# Patient Record
Sex: Male | Born: 1960
Health system: Southern US, Community
[De-identification: ages and names within clinical notes are randomized; demographics above are authoritative.]

## PROBLEM LIST (undated history)

## (undated) DIAGNOSIS — F411 Generalized anxiety disorder: Secondary | ICD-10-CM

## (undated) DIAGNOSIS — Z87442 Personal history of urinary calculi: Secondary | ICD-10-CM

## (undated) DIAGNOSIS — M545 Low back pain, unspecified: Secondary | ICD-10-CM

## (undated) DIAGNOSIS — J449 Chronic obstructive pulmonary disease, unspecified: Secondary | ICD-10-CM

## (undated) DIAGNOSIS — Z923 Personal history of irradiation: Secondary | ICD-10-CM

## (undated) DIAGNOSIS — I10 Essential (primary) hypertension: Secondary | ICD-10-CM

## (undated) DIAGNOSIS — E785 Hyperlipidemia, unspecified: Secondary | ICD-10-CM

## (undated) DIAGNOSIS — R0789 Other chest pain: Secondary | ICD-10-CM

## (undated) DIAGNOSIS — K219 Gastro-esophageal reflux disease without esophagitis: Secondary | ICD-10-CM

## (undated) DIAGNOSIS — J4489 Other specified chronic obstructive pulmonary disease: Secondary | ICD-10-CM

## (undated) DIAGNOSIS — J309 Allergic rhinitis, unspecified: Secondary | ICD-10-CM

## (undated) DIAGNOSIS — C801 Malignant (primary) neoplasm, unspecified: Secondary | ICD-10-CM

## (undated) DIAGNOSIS — J45909 Unspecified asthma, uncomplicated: Secondary | ICD-10-CM

## (undated) HISTORY — DX: Allergic rhinitis, unspecified: J30.9

## (undated) HISTORY — DX: Gastro-esophageal reflux disease without esophagitis: K21.9

## (undated) HISTORY — DX: Chronic obstructive pulmonary disease, unspecified: J44.9

## (undated) HISTORY — PX: APPENDECTOMY: SHX54

## (undated) HISTORY — DX: Hyperlipidemia, unspecified: E78.5

## (undated) HISTORY — PX: COLONOSCOPY: SHX174

## (undated) HISTORY — DX: Low back pain, unspecified: M54.50

## (undated) HISTORY — DX: Generalized anxiety disorder: F41.1

## (undated) HISTORY — DX: Low back pain: M54.5

## (undated) HISTORY — DX: Other chest pain: R07.89

## (undated) HISTORY — DX: Other specified chronic obstructive pulmonary disease: J44.89

## (undated) HISTORY — PX: VASECTOMY: SHX75

## (undated) NOTE — *Deleted (*Deleted)
Medical screening examination/treatment/procedure(s) were conducted as a shared visit with non-physician practitioner(s) and myself.  I personally evaluated the patient during the encounter  

---

## 2000-04-25 HISTORY — PX: NASAL TURBINATE REDUCTION: SHX2072

## 2000-09-12 ENCOUNTER — Encounter (INDEPENDENT_AMBULATORY_CARE_PROVIDER_SITE_OTHER): Payer: Self-pay

## 2000-09-12 ENCOUNTER — Other Ambulatory Visit: Admission: RE | Admit: 2000-09-12 | Discharge: 2000-09-12 | Payer: Self-pay | Admitting: Otolaryngology

## 2005-09-16 ENCOUNTER — Ambulatory Visit: Payer: Self-pay | Admitting: Pulmonary Disease

## 2005-11-01 ENCOUNTER — Ambulatory Visit: Payer: Self-pay | Admitting: Pulmonary Disease

## 2005-11-02 ENCOUNTER — Ambulatory Visit: Payer: Self-pay | Admitting: Pulmonary Disease

## 2006-03-01 ENCOUNTER — Ambulatory Visit: Payer: Self-pay | Admitting: Pulmonary Disease

## 2006-08-14 ENCOUNTER — Ambulatory Visit: Payer: Self-pay | Admitting: Pulmonary Disease

## 2006-08-14 IMAGING — CR DG CHEST 2V
2 series · 2 of 2 positions shown · non-contrast
Comparison: [DATE]

CLINICAL DATA: Cough, short of breath.
 CHEST - 2 VIEW:

[view not recorded (1 of 2)]
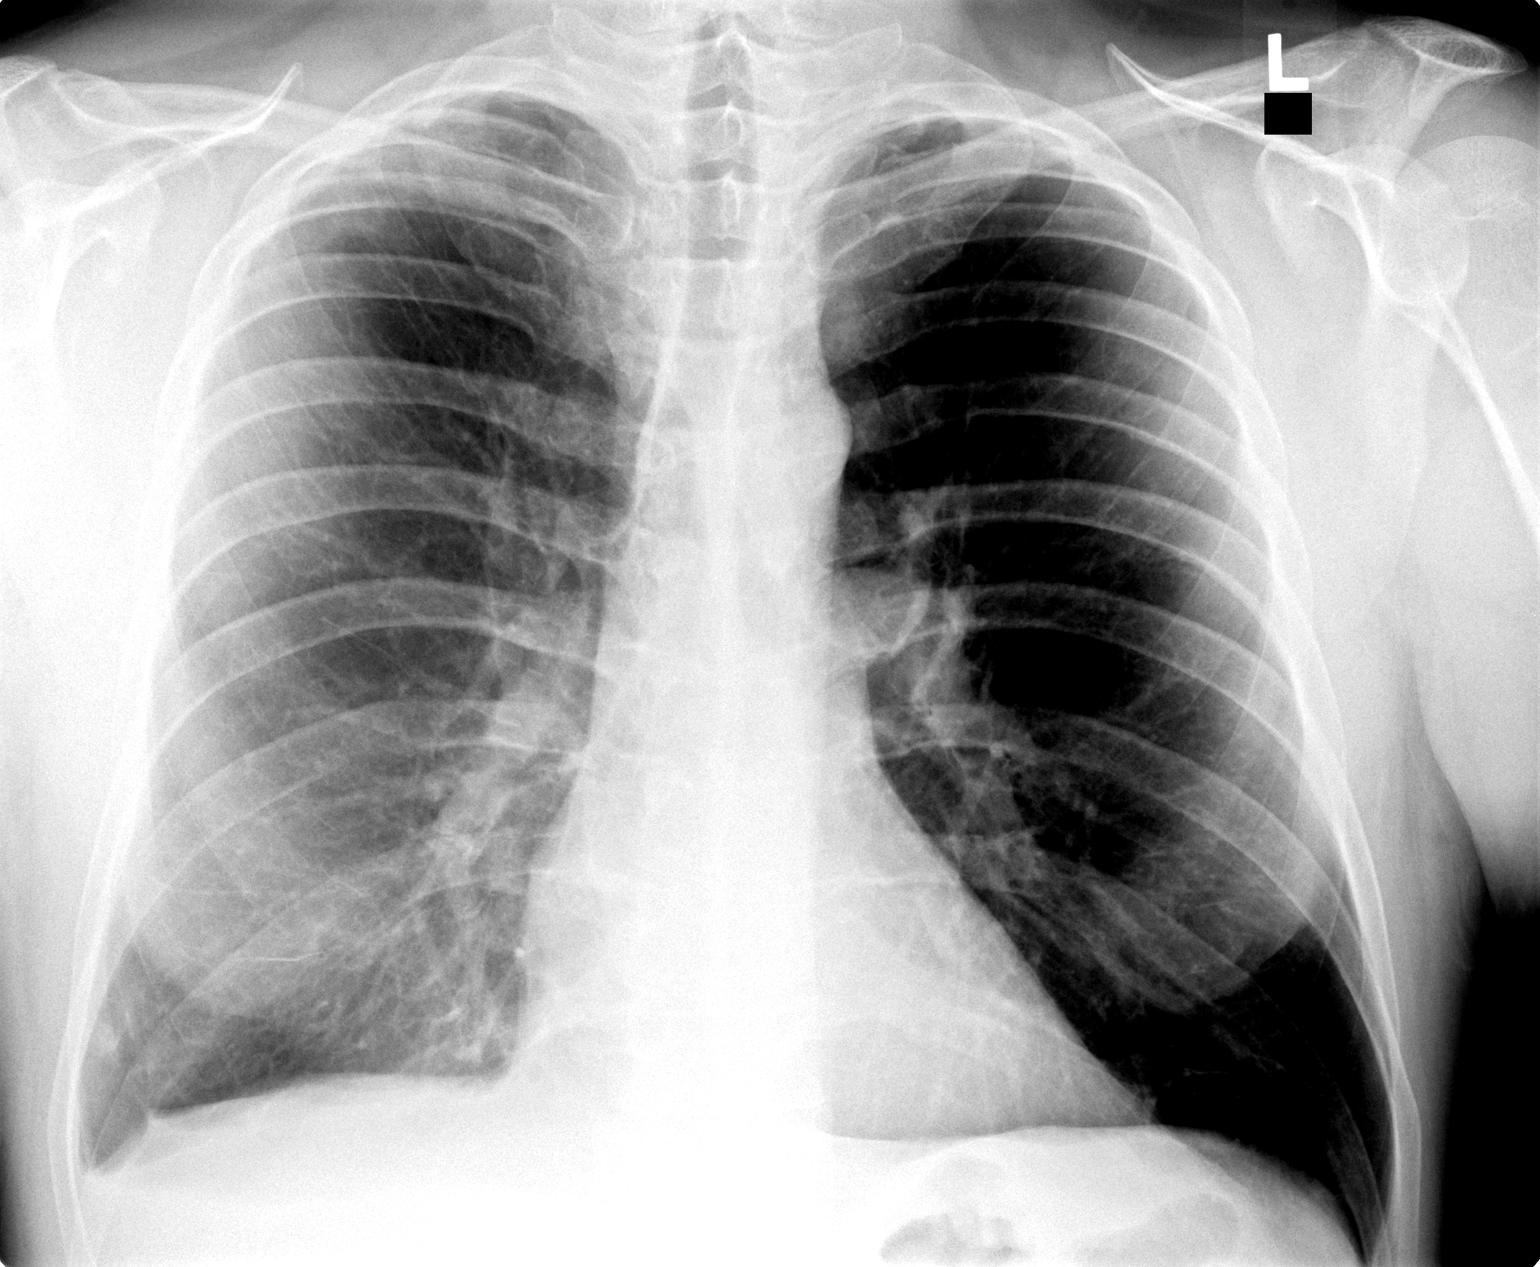

[view not recorded (2 of 2)]
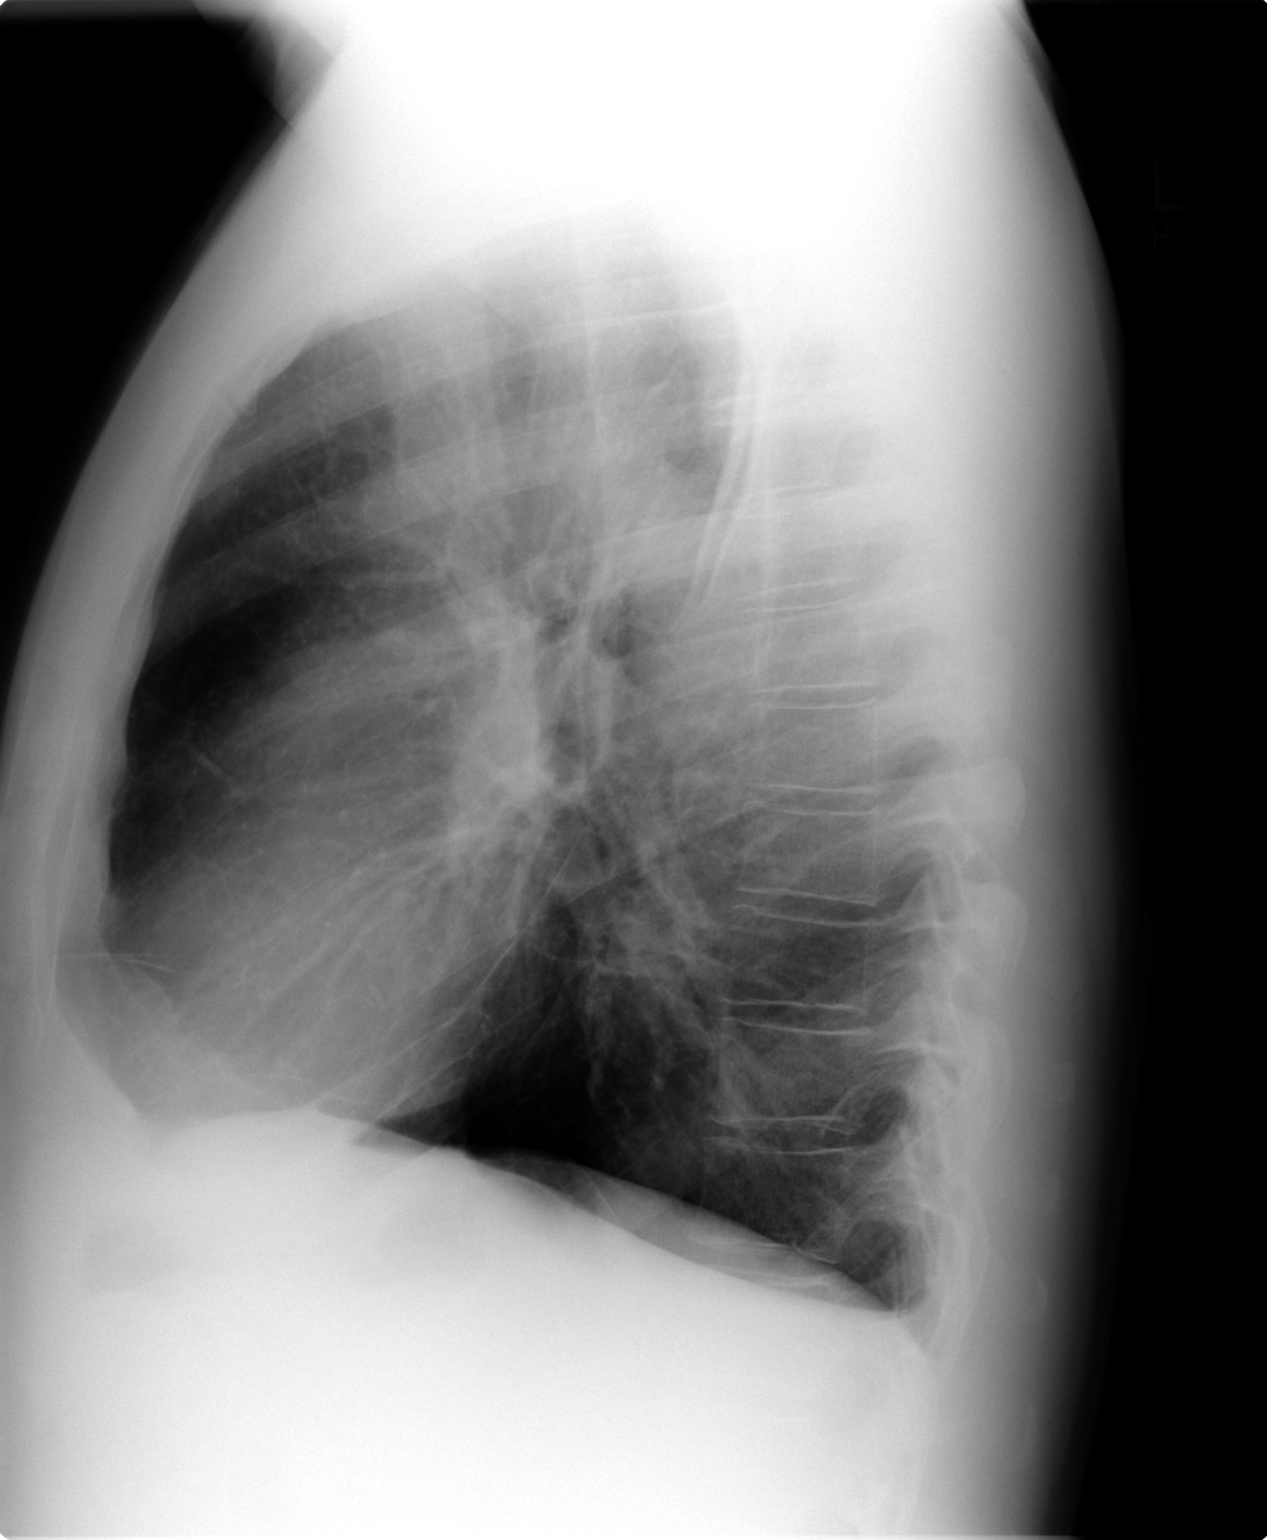

[2 of 2 positions shown; findings below may reference images not displayed]

FINDINGS: The lungs are hyperinflated with changes of COPD.  Ill-defined 1 cm density in the right lung base is unchanged from the prior study.  Stability would suggest a chronic process such as scarring.   No infiltrate or effusions identified.
IMPRESSION: 1.  COPD with hyperinflation and scarring. 
 2.  Stable density in the right lung base.

## 2006-08-25 ENCOUNTER — Ambulatory Visit: Payer: Self-pay | Admitting: Cardiology

## 2006-08-25 ENCOUNTER — Ambulatory Visit: Payer: Self-pay | Admitting: Pulmonary Disease

## 2006-08-25 LAB — CONVERTED CEMR LAB
ALT: 32 units/L (ref 0–40)
AST: 24 units/L (ref 0–37)
Albumin: 3.8 g/dL (ref 3.5–5.2)
Alkaline Phosphatase: 104 units/L (ref 39–117)
BUN: 5 mg/dL — ABNORMAL LOW (ref 6–23)
Basophils Absolute: 0 10*3/uL (ref 0.0–0.1)
Basophils Relative: 0.3 % (ref 0.0–1.0)
Bilirubin, Direct: 0.2 mg/dL (ref 0.0–0.3)
CO2: 31 meq/L (ref 19–32)
Calcium: 9 mg/dL (ref 8.4–10.5)
Chloride: 106 meq/L (ref 96–112)
Cholesterol: 133 mg/dL (ref 0–200)
Creatinine, Ser: 0.9 mg/dL (ref 0.4–1.5)
Eosinophils Absolute: 0.1 10*3/uL (ref 0.0–0.6)
Eosinophils Relative: 1.9 % (ref 0.0–5.0)
GFR calc Af Amer: 117 mL/min
GFR calc non Af Amer: 97 mL/min
Glucose, Bld: 99 mg/dL (ref 70–99)
HCT: 46.1 % (ref 39.0–52.0)
HDL: 33.7 mg/dL — ABNORMAL LOW (ref >39.0)
Hemoglobin: 16 g/dL (ref 13.0–17.0)
LDL Cholesterol: 74 mg/dL (ref 0–99)
Lymphocytes Relative: 35 % (ref 12.0–46.0)
MCHC: 34.6 g/dL (ref 30.0–36.0)
MCV: 92.4 fL (ref 78.0–100.0)
Monocytes Absolute: 0.5 10*3/uL (ref 0.2–0.7)
Monocytes Relative: 8.3 % (ref 3.0–11.0)
Neutro Abs: 3 10*3/uL (ref 1.4–7.7)
Neutrophils Relative %: 54.5 % (ref 43.0–77.0)
Platelets: 275 10*3/uL (ref 150–400)
Potassium: 4 meq/L (ref 3.5–5.1)
RBC: 4.99 M/uL (ref 4.22–5.81)
RDW: 13.1 % (ref 11.5–14.6)
Sed Rate: 9 mm/hr (ref 0–20)
Sodium: 140 meq/L (ref 135–145)
TSH: 2.09 microintl units/mL (ref 0.35–5.50)
Total Bilirubin: 1.1 mg/dL (ref 0.3–1.2)
Total CHOL/HDL Ratio: 3.9
Total Protein: 6.6 g/dL (ref 6.0–8.3)
Triglycerides: 129 mg/dL (ref 0–149)
VLDL: 26 mg/dL (ref 0–40)
WBC: 5.6 10*3/uL (ref 4.5–10.5)

## 2007-02-26 ENCOUNTER — Ambulatory Visit: Payer: Self-pay | Admitting: Pulmonary Disease

## 2007-02-26 DIAGNOSIS — M545 Low back pain, unspecified: Secondary | ICD-10-CM | POA: Insufficient documentation

## 2007-02-26 DIAGNOSIS — R930 Abnormal findings on diagnostic imaging of skull and head, not elsewhere classified: Secondary | ICD-10-CM | POA: Insufficient documentation

## 2007-02-26 DIAGNOSIS — J309 Allergic rhinitis, unspecified: Secondary | ICD-10-CM | POA: Insufficient documentation

## 2007-02-26 DIAGNOSIS — J449 Chronic obstructive pulmonary disease, unspecified: Secondary | ICD-10-CM | POA: Insufficient documentation

## 2007-04-12 ENCOUNTER — Telehealth: Payer: Self-pay | Admitting: Pulmonary Disease

## 2007-11-14 ENCOUNTER — Ambulatory Visit: Payer: Self-pay | Admitting: Pulmonary Disease

## 2007-11-14 IMAGING — CR DG CHEST 2V
2 series · 2 of 2 positions shown · non-contrast
Comparison: [DATE]

CLINICAL DATA: Abnormal chest x-ray.

CHEST - 2 VIEW

[view not recorded (1 of 2)]
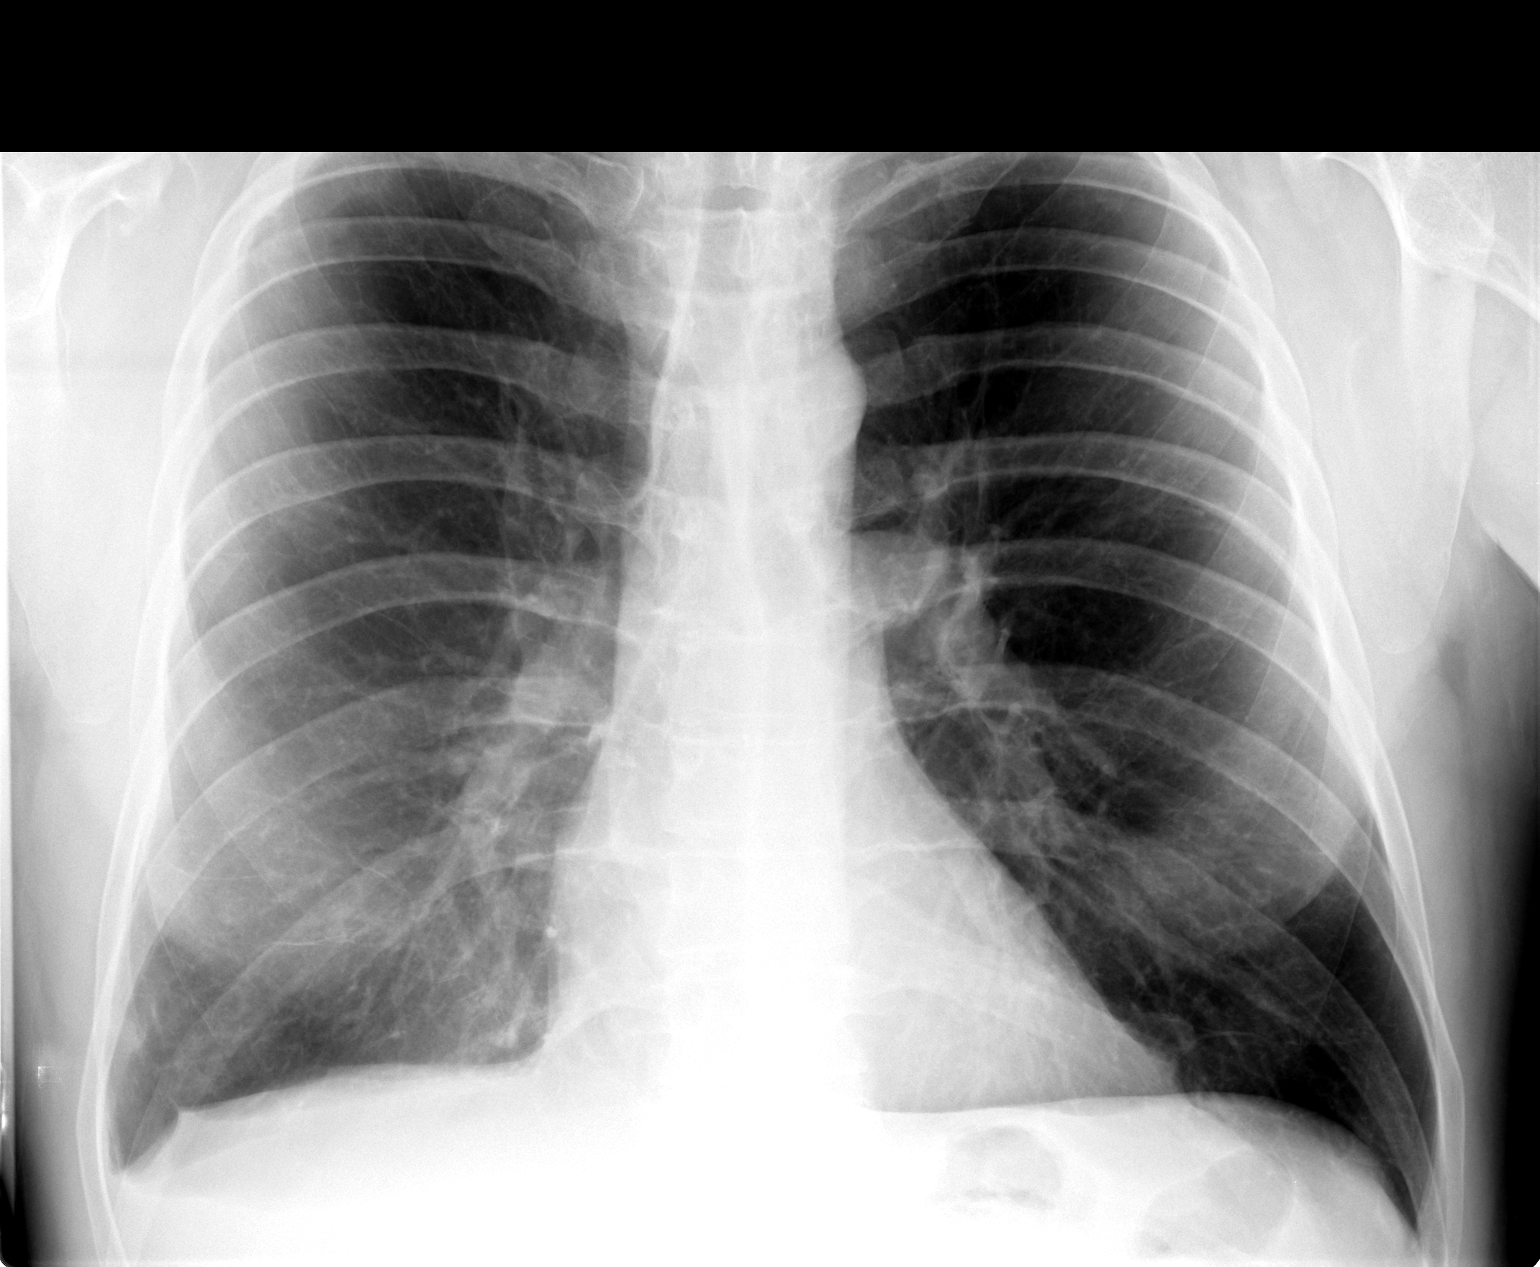

[view not recorded (2 of 2)]
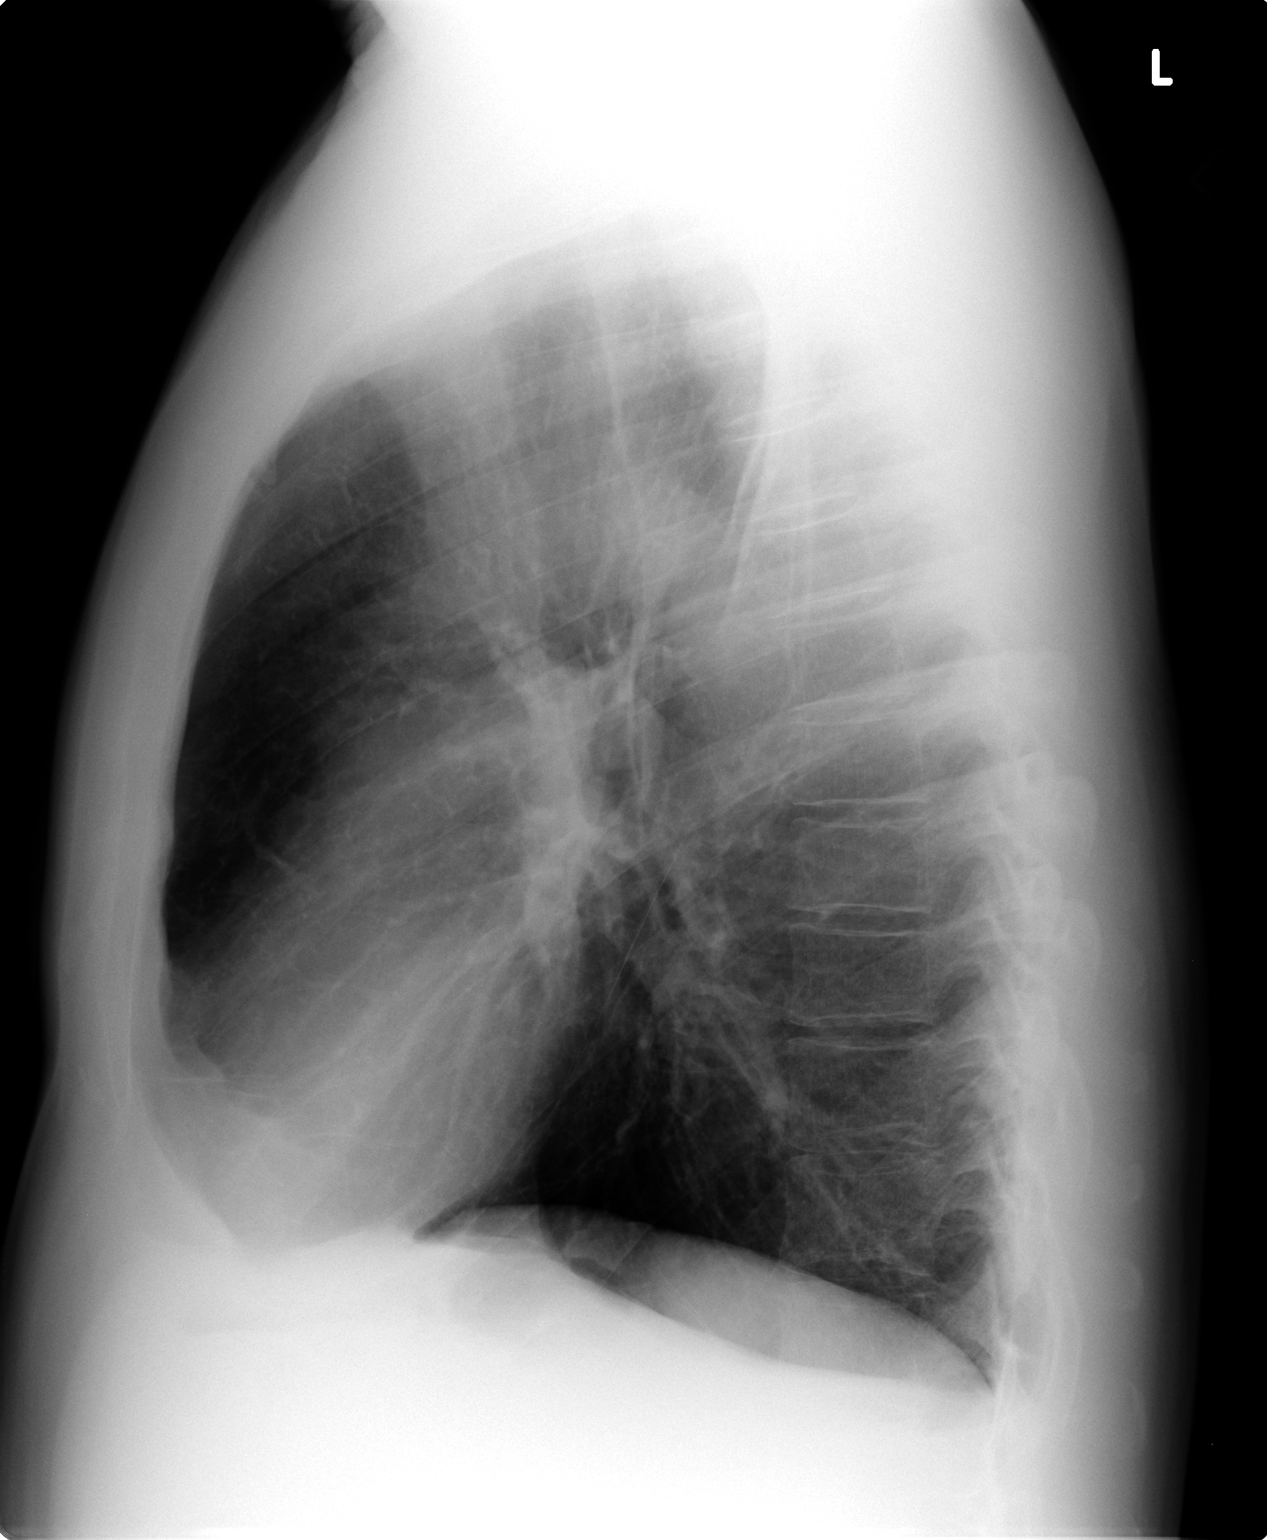

[2 of 2 positions shown; findings below may reference images not displayed]

FINDINGS: Trachea is midline.  Heart size normal.  Lungs appear
emphysematous with pleural parenchymal scarring at the right
costophrenic angle.  No pleural fluid.
IMPRESSION: No acute findings.

## 2007-11-17 DIAGNOSIS — F411 Generalized anxiety disorder: Secondary | ICD-10-CM | POA: Insufficient documentation

## 2007-12-07 ENCOUNTER — Ambulatory Visit: Payer: Self-pay | Admitting: Pulmonary Disease

## 2007-12-07 ENCOUNTER — Ambulatory Visit: Payer: Self-pay

## 2008-04-11 ENCOUNTER — Telehealth (INDEPENDENT_AMBULATORY_CARE_PROVIDER_SITE_OTHER): Payer: Self-pay | Admitting: *Deleted

## 2008-12-16 ENCOUNTER — Ambulatory Visit: Payer: Self-pay | Admitting: Internal Medicine

## 2009-01-09 ENCOUNTER — Telehealth: Payer: Self-pay | Admitting: Pulmonary Disease

## 2009-02-13 ENCOUNTER — Ambulatory Visit: Payer: Self-pay | Admitting: Pulmonary Disease

## 2009-02-13 DIAGNOSIS — J019 Acute sinusitis, unspecified: Secondary | ICD-10-CM | POA: Insufficient documentation

## 2009-02-13 DIAGNOSIS — J0100 Acute maxillary sinusitis, unspecified: Secondary | ICD-10-CM

## 2009-08-24 ENCOUNTER — Ambulatory Visit: Payer: Self-pay | Admitting: Pulmonary Disease

## 2009-08-24 DIAGNOSIS — J209 Acute bronchitis, unspecified: Secondary | ICD-10-CM | POA: Insufficient documentation

## 2009-08-31 ENCOUNTER — Telehealth (INDEPENDENT_AMBULATORY_CARE_PROVIDER_SITE_OTHER): Payer: Self-pay | Admitting: *Deleted

## 2009-12-11 ENCOUNTER — Ambulatory Visit: Payer: Self-pay | Admitting: Pulmonary Disease

## 2009-12-11 DIAGNOSIS — E785 Hyperlipidemia, unspecified: Secondary | ICD-10-CM | POA: Insufficient documentation

## 2009-12-11 LAB — CONVERTED CEMR LAB
ALT: 44 units/L (ref 0–53)
AST: 32 units/L (ref 0–37)
Albumin: 4.2 g/dL (ref 3.5–5.2)
Alkaline Phosphatase: 109 units/L (ref 39–117)
BUN: 11 mg/dL (ref 6–23)
Basophils Absolute: 0 10*3/uL (ref 0.0–0.1)
Basophils Relative: 0.7 % (ref 0.0–3.0)
Bilirubin Urine: NEGATIVE
Bilirubin, Direct: 0.2 mg/dL (ref 0.0–0.3)
CO2: 27 meq/L (ref 19–32)
Calcium: 9 mg/dL (ref 8.4–10.5)
Chloride: 103 meq/L (ref 96–112)
Cholesterol: 145 mg/dL (ref 0–200)
Creatinine, Ser: 0.8 mg/dL (ref 0.4–1.5)
Direct LDL: 68.3 mg/dL
Eosinophils Absolute: 0.1 10*3/uL (ref 0.0–0.7)
Eosinophils Relative: 1.4 % (ref 0.0–5.0)
GFR calc non Af Amer: 115.86 mL/min (ref >60)
Glucose, Bld: 84 mg/dL (ref 70–99)
HCT: 46.7 % (ref 39.0–52.0)
HDL: 33.3 mg/dL — ABNORMAL LOW (ref >39.00)
Hemoglobin, Urine: NEGATIVE
Hemoglobin: 16.5 g/dL (ref 13.0–17.0)
Ketones, ur: NEGATIVE mg/dL
Leukocytes, UA: NEGATIVE
Lymphocytes Relative: 33.8 % (ref 12.0–46.0)
Lymphs Abs: 2.4 10*3/uL (ref 0.7–4.0)
MCHC: 35.2 g/dL (ref 30.0–36.0)
MCV: 93.6 fL (ref 78.0–100.0)
Monocytes Absolute: 0.7 10*3/uL (ref 0.1–1.0)
Monocytes Relative: 10.2 % (ref 3.0–12.0)
Neutro Abs: 3.8 10*3/uL (ref 1.4–7.7)
Neutrophils Relative %: 53.9 % (ref 43.0–77.0)
Nitrite: NEGATIVE
PSA: 0.45 ng/mL (ref 0.10–4.00)
Platelets: 238 10*3/uL (ref 150.0–400.0)
Potassium: 4.4 meq/L (ref 3.5–5.1)
RBC: 4.99 M/uL (ref 4.22–5.81)
RDW: 13.9 % (ref 11.5–14.6)
Sodium: 139 meq/L (ref 135–145)
Specific Gravity, Urine: 1.02 (ref 1.000–1.030)
TSH: 1.69 microintl units/mL (ref 0.35–5.50)
Total Bilirubin: 1.4 mg/dL — ABNORMAL HIGH (ref 0.3–1.2)
Total CHOL/HDL Ratio: 4
Total Protein: 7.2 g/dL (ref 6.0–8.3)
Triglycerides: 247 mg/dL — ABNORMAL HIGH (ref 0.0–149.0)
Urine Glucose: NEGATIVE mg/dL
Urobilinogen, UA: 1 (ref 0.0–1.0)
VLDL: 49.4 mg/dL — ABNORMAL HIGH (ref 0.0–40.0)
WBC: 7 10*3/uL (ref 4.5–10.5)
pH: 7 (ref 5.0–8.0)

## 2009-12-11 IMAGING — CR DG CHEST 2V
3 series · 3 of 3 positions shown · non-contrast
Comparison: Chest x-ray of [DATE]

CLINICAL DATA: Cough, short of breath, former smoker

CHEST - 2 VIEW

[view not recorded (1 of 3)]
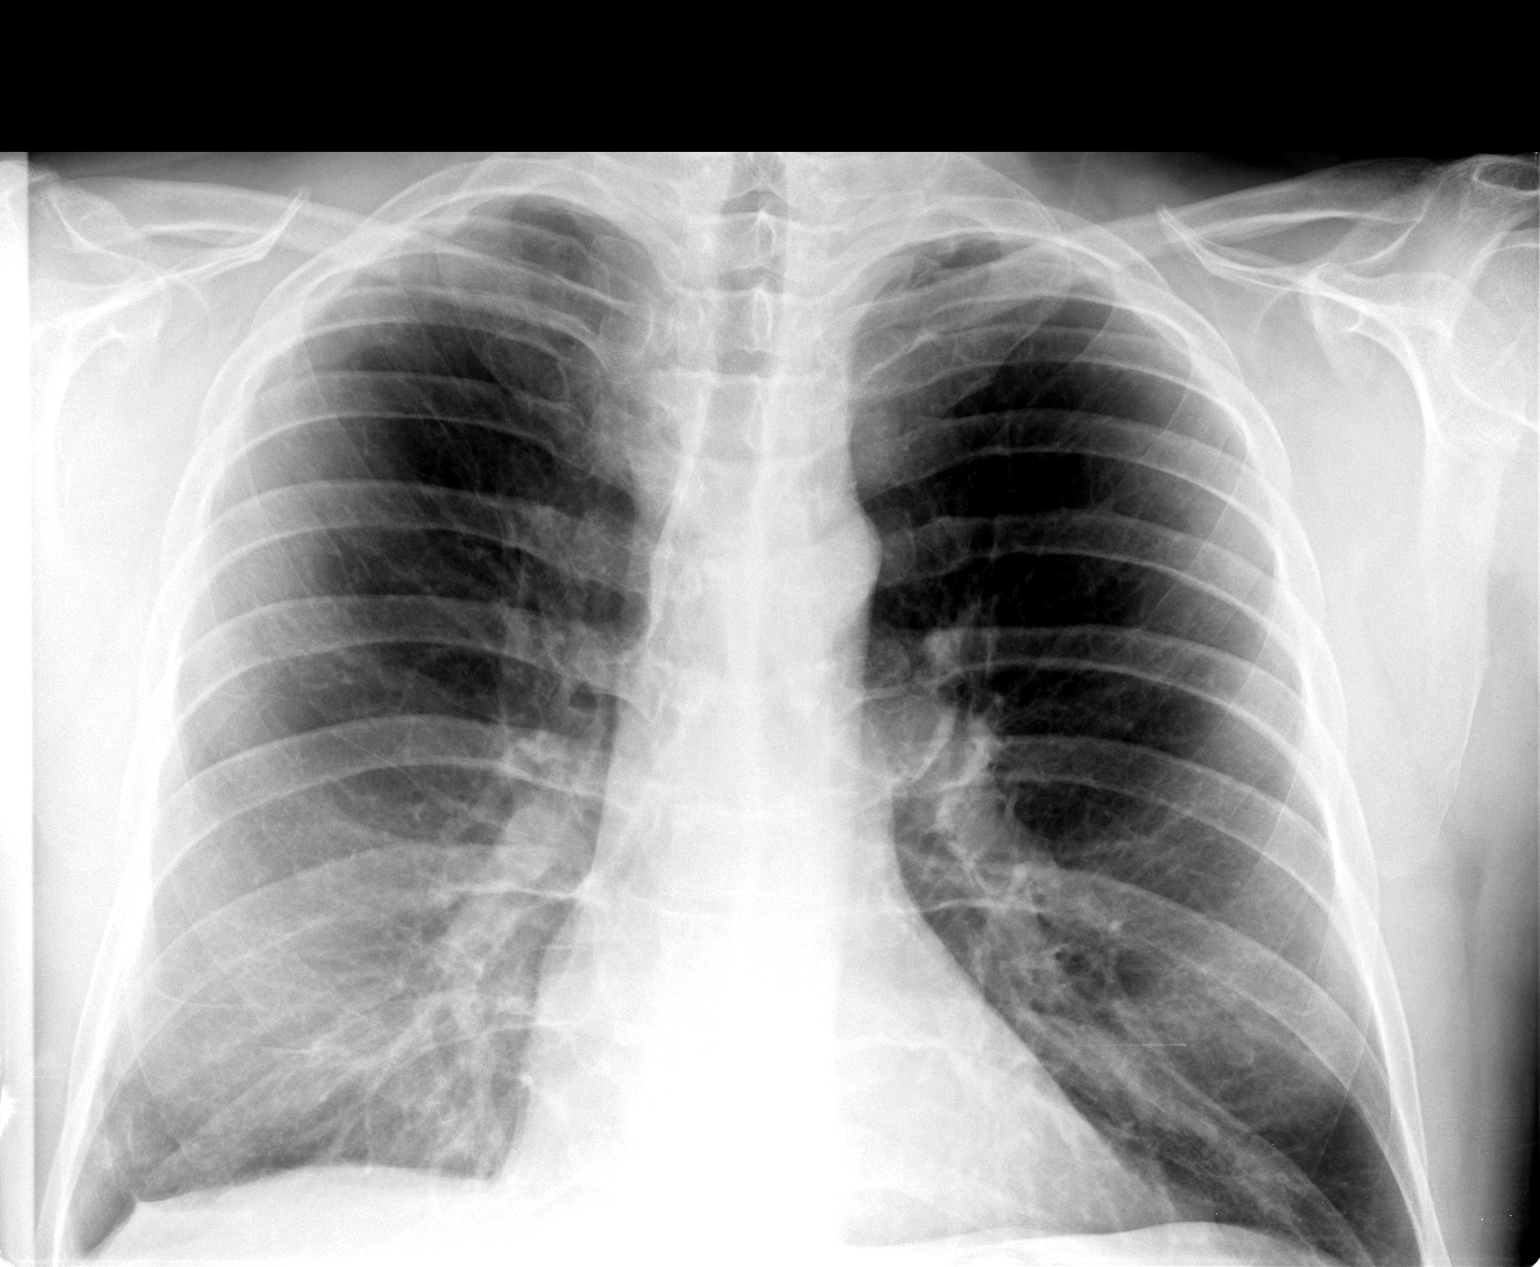

[view not recorded (2 of 3)]
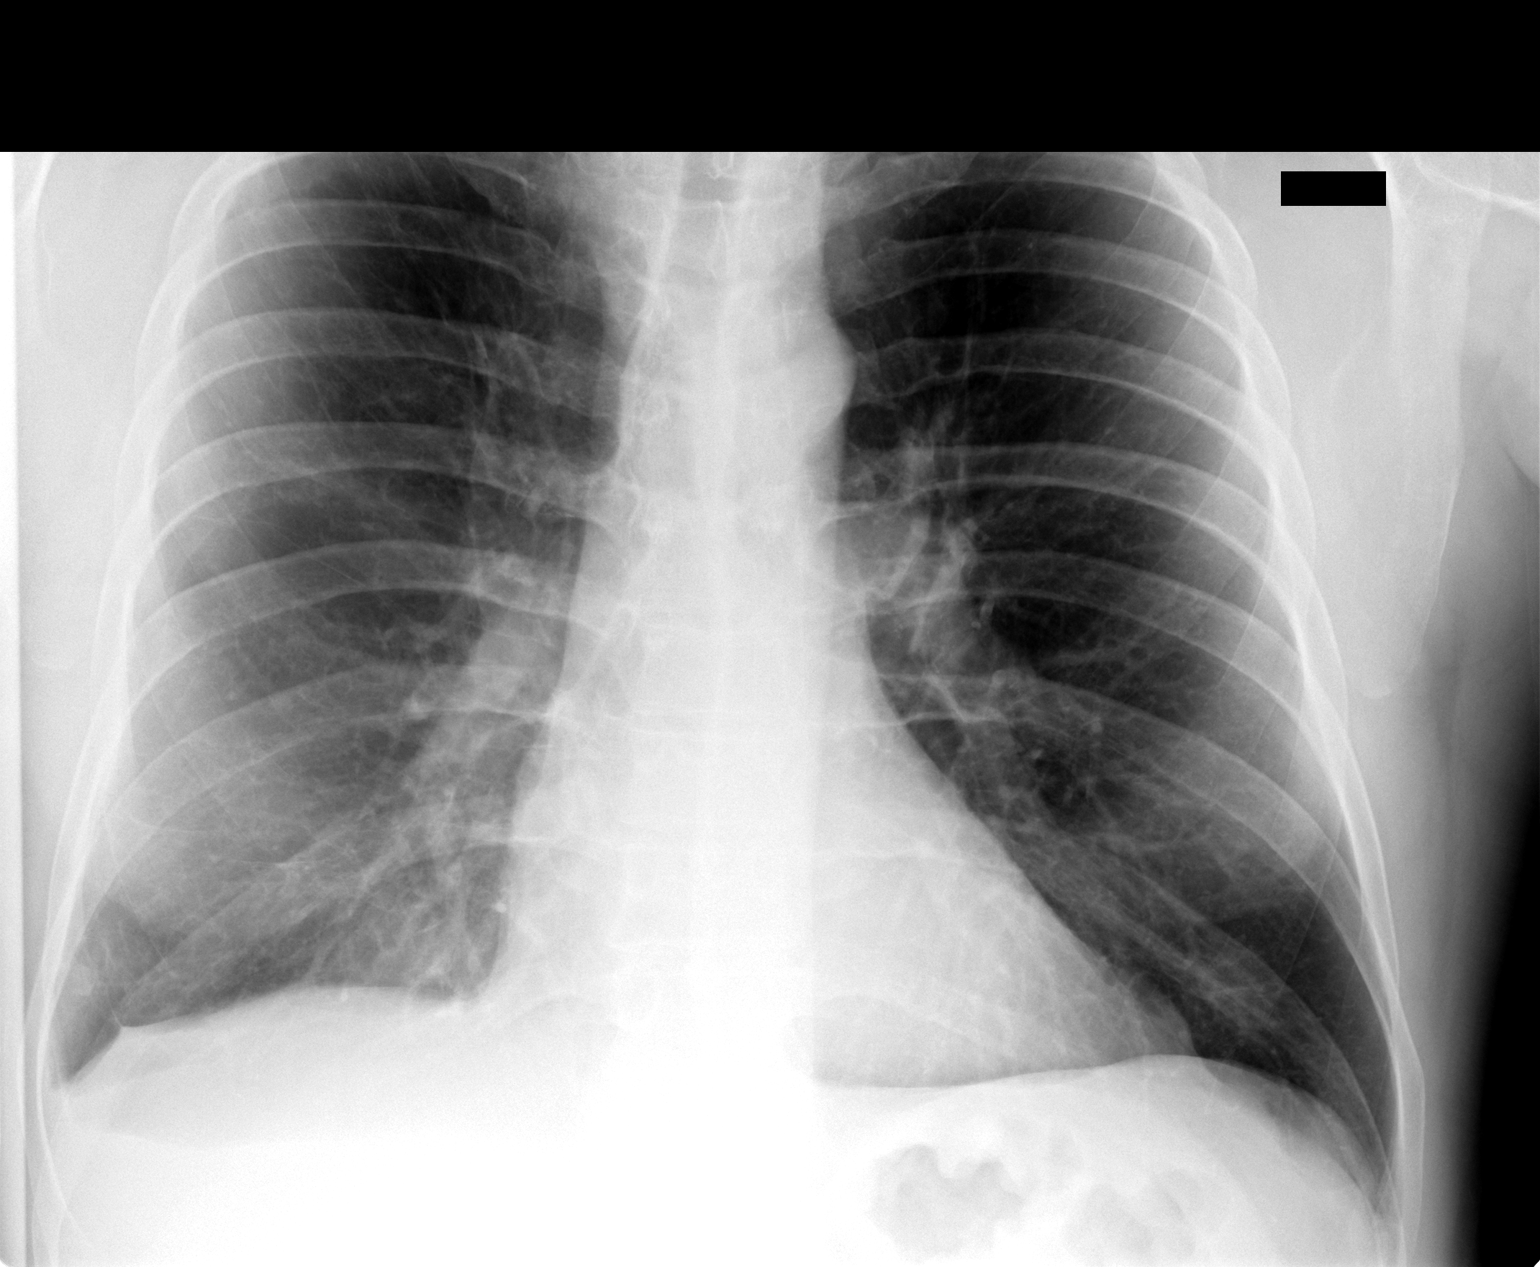

[view not recorded (3 of 3)]
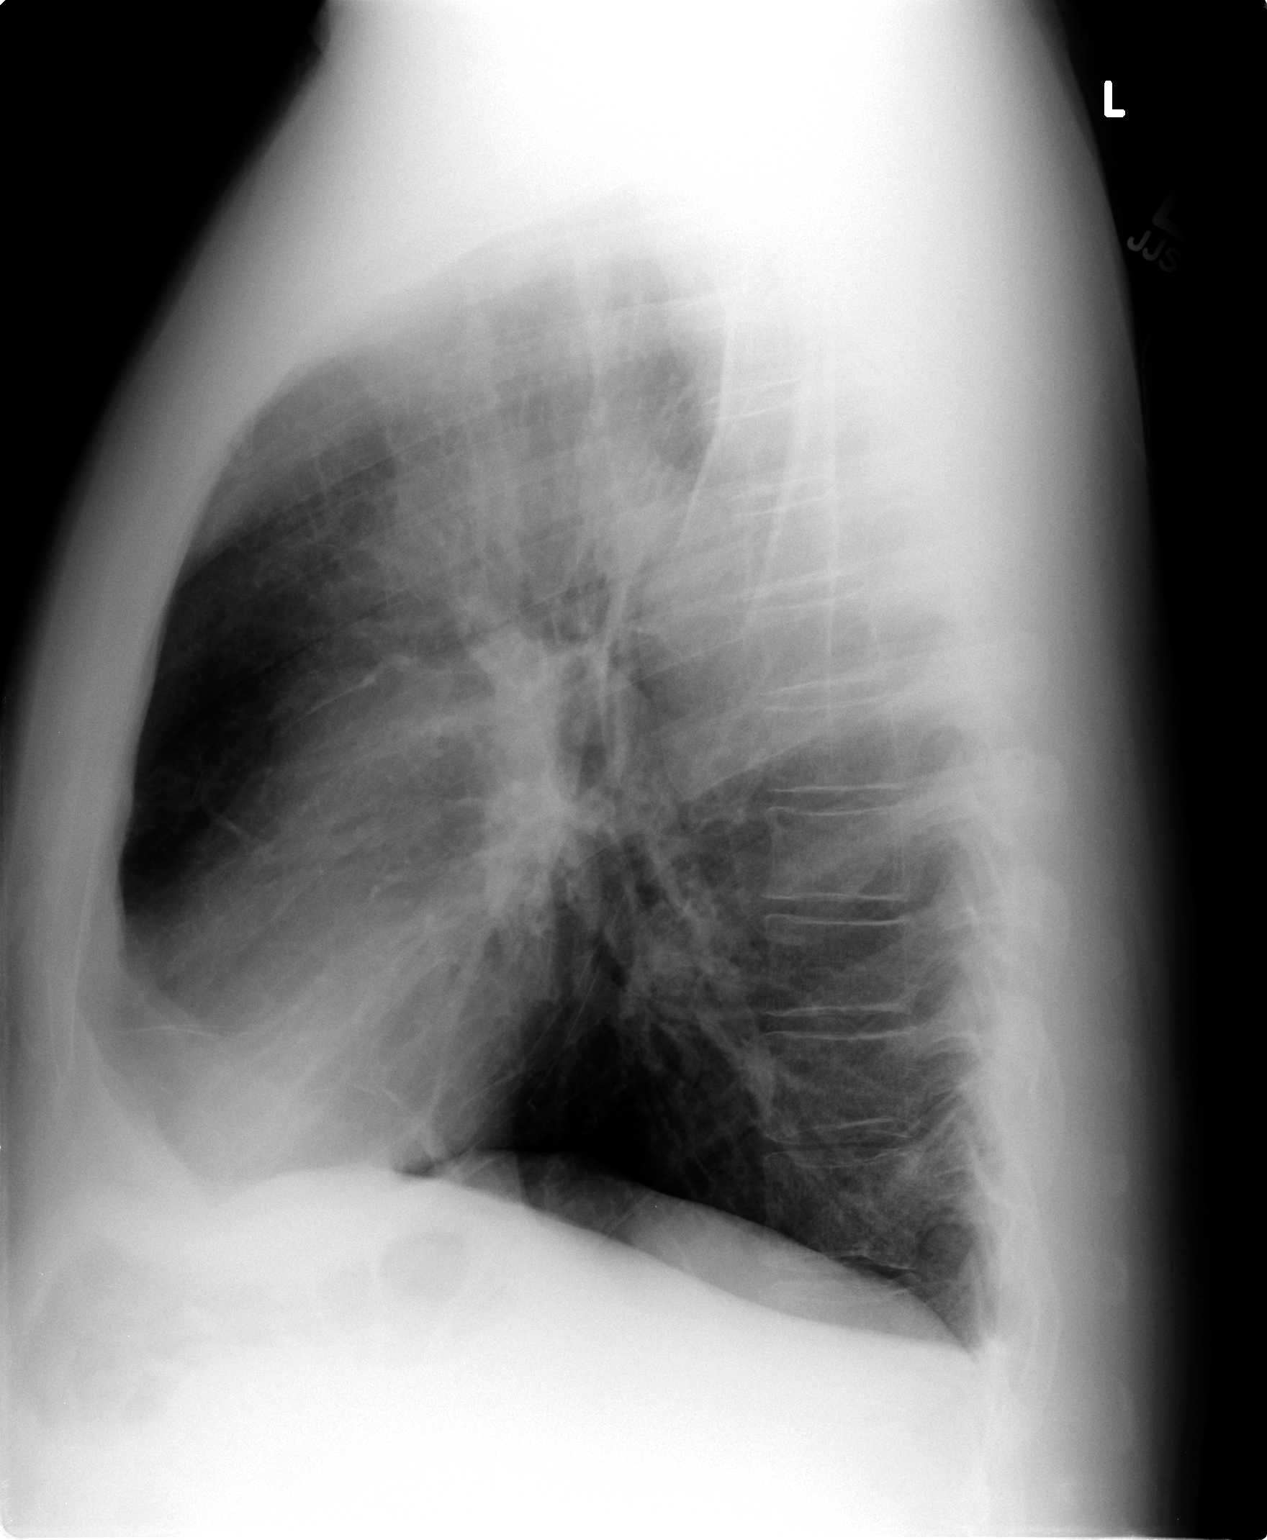

[3 of 3 positions shown; findings below may reference images not displayed]

FINDINGS: The lungs are hyperaerated consistent with COPD.  Minimal
scarring at the right costophrenic angle appears stable.  No active
infiltrate or effusion is seen.  Mediastinal contours are normal.
The heart is within normal limits in size.  No bony abnormality is
seen.
IMPRESSION: COPD.  No active lung disease.

## 2010-01-08 ENCOUNTER — Telehealth: Payer: Self-pay | Admitting: Pulmonary Disease

## 2010-01-25 ENCOUNTER — Ambulatory Visit: Payer: Self-pay | Admitting: Pulmonary Disease

## 2010-01-25 IMAGING — CR DG CHEST 2V
2 series · 2 of 2 positions shown · non-contrast
Comparison: Chest x-ray of [DATE]

CLINICAL DATA: Lateral left rib pain for 1 month, no acute injury,
cough, former smoker

CHEST - 2 VIEW

[view not recorded (1 of 2)]
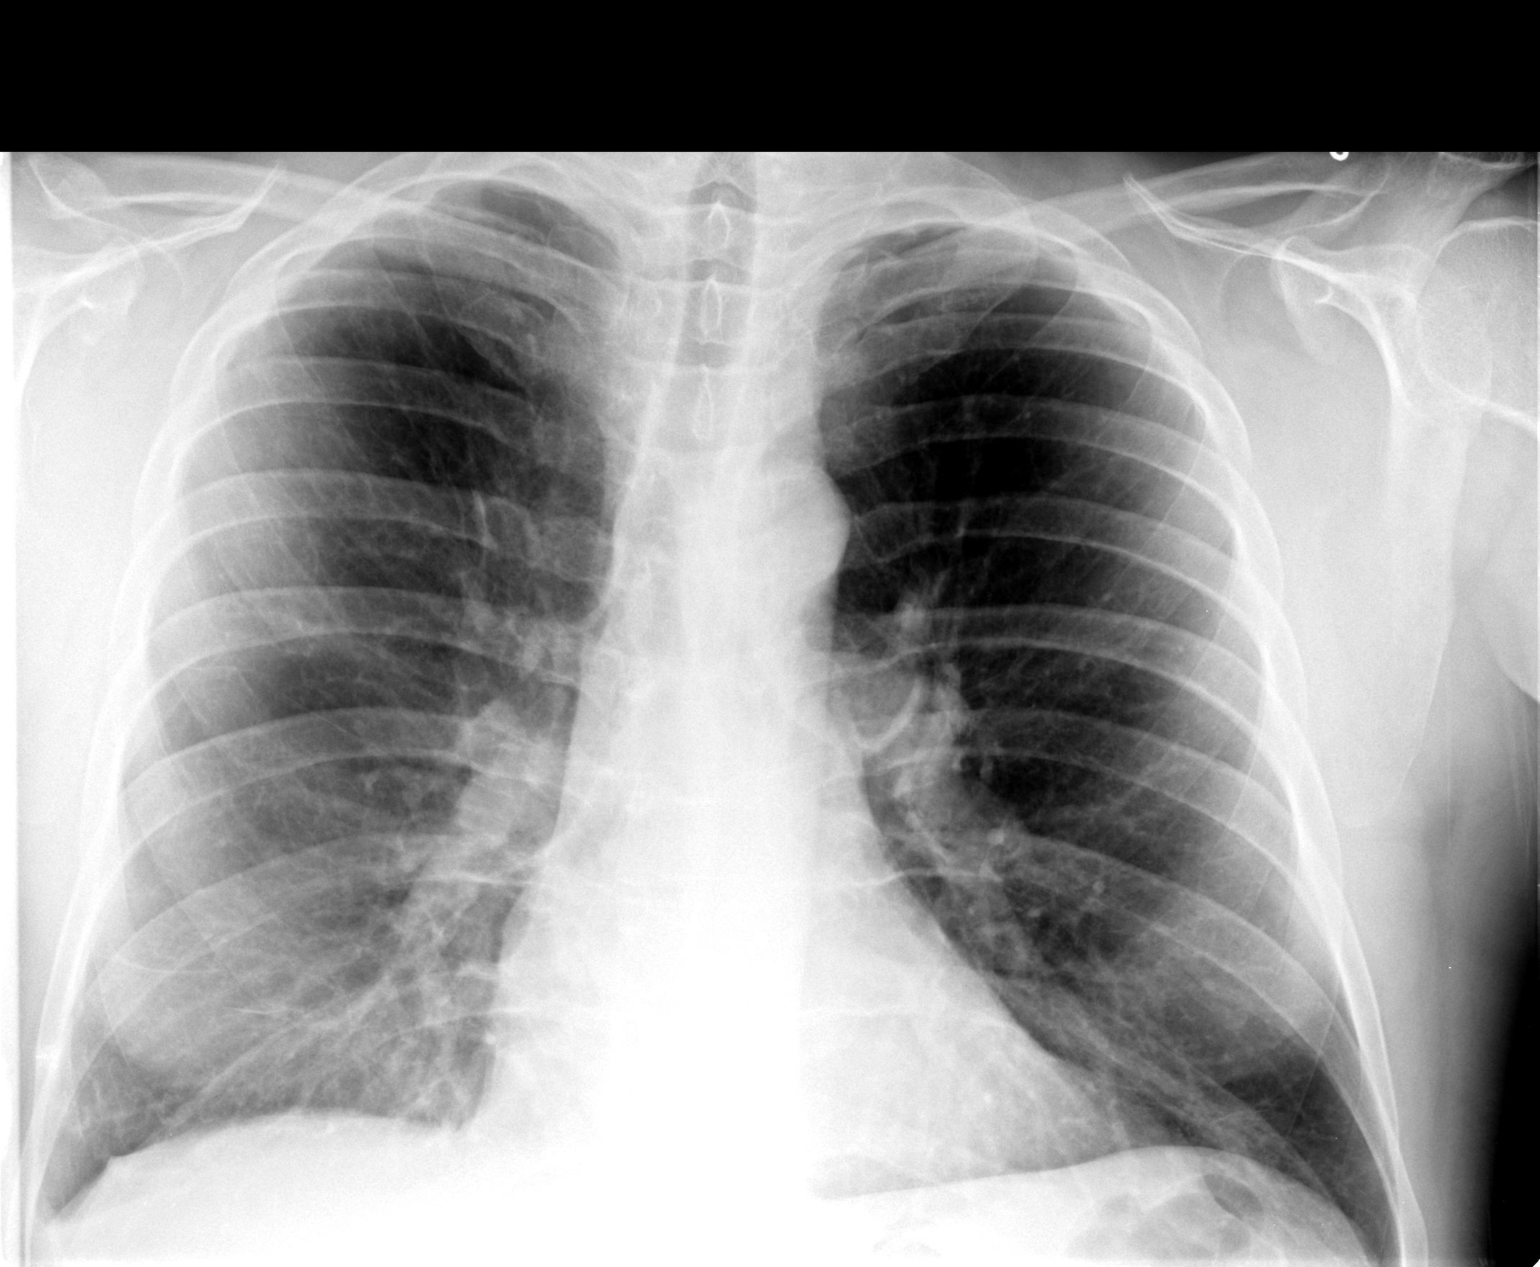

[view not recorded (2 of 2)]
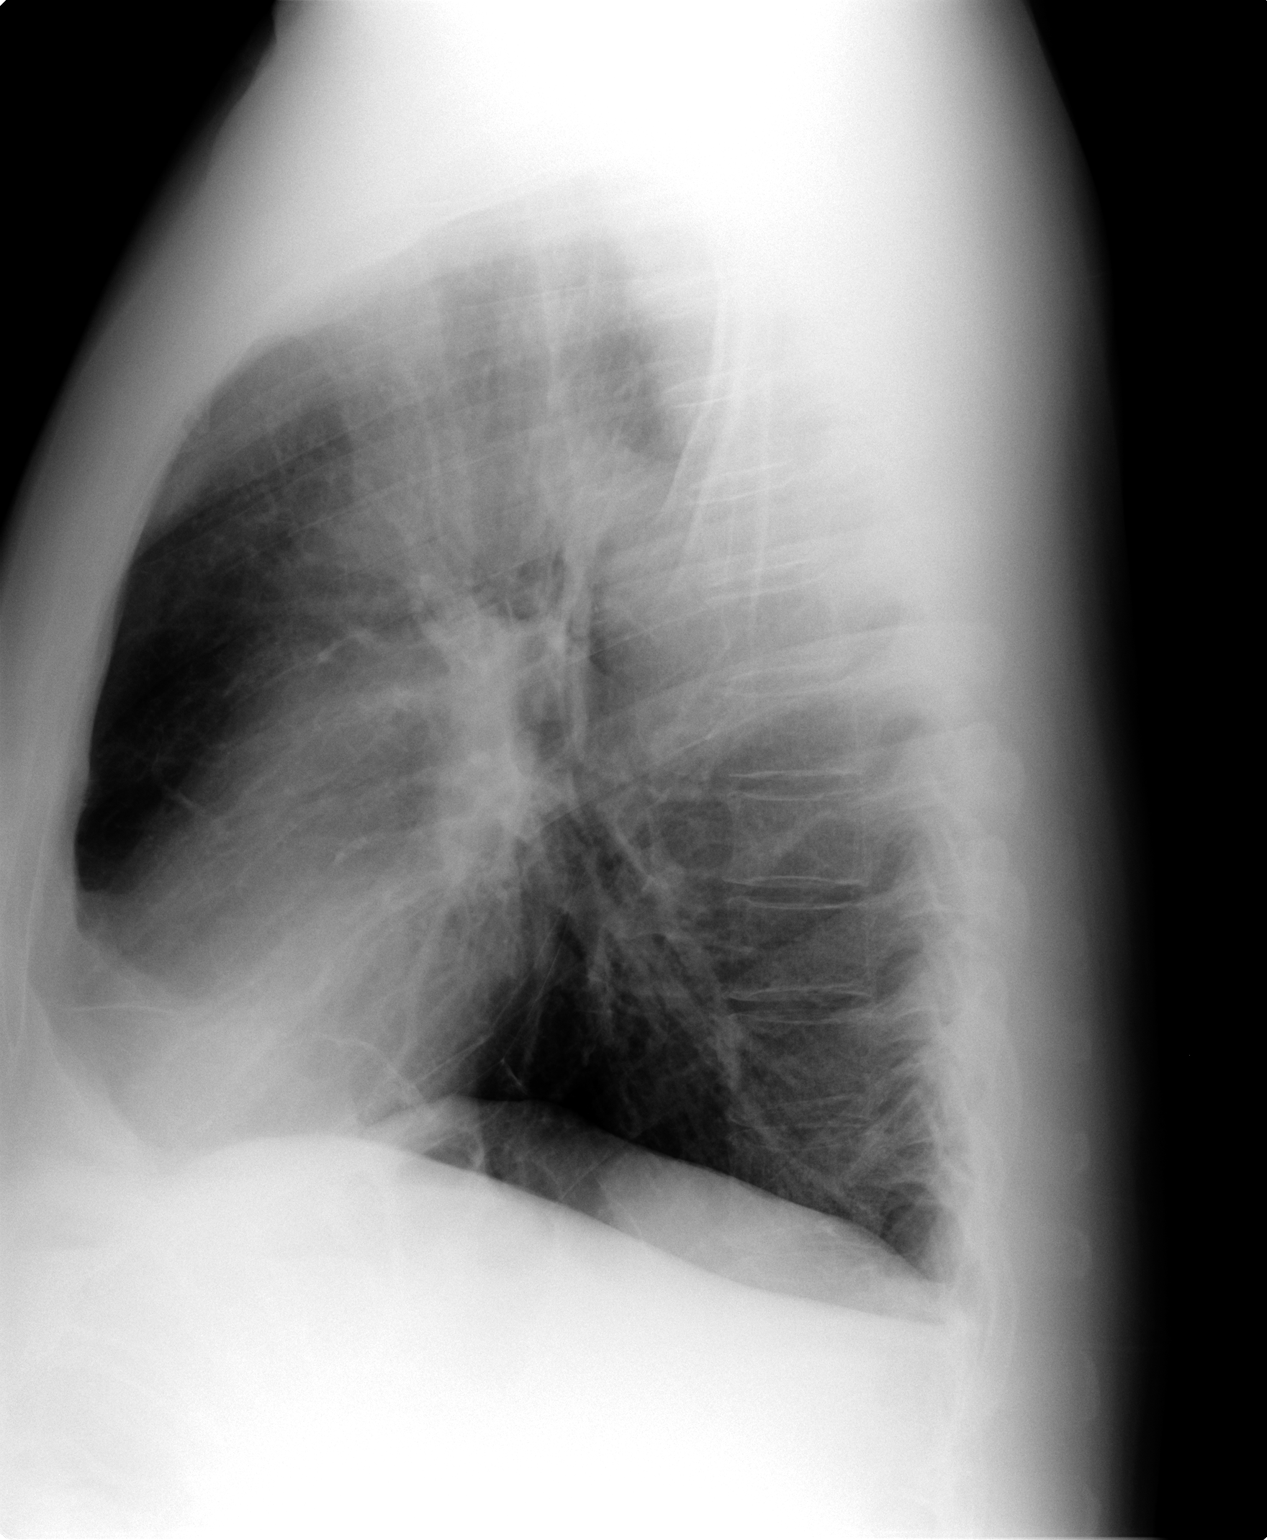

[2 of 2 positions shown; findings below may reference images not displayed]

FINDINGS: The lungs are clear but hyperaerated consistent with
COPD. No infiltrate or effusion is seen.  Mediastinal contours are
stable.  The heart is within normal limits in size.  There is mild
peribronchial thickening present.  No bony abnormality is seen.
IMPRESSION: Hyperaeration consistent with COPD.  Mild peribronchial thickening.
No definite active process.

## 2010-01-25 IMAGING — CR DG RIBS 2V*L*
4 series · 4 of 4 positions shown · non-contrast
Comparison: Chest x-ray of [DATE]

CLINICAL DATA: Left lateral rib pain for 1 month, no injury, cough,
former smoker

LEFT RIBS - 2 VIEW

[view not recorded (1 of 4)]
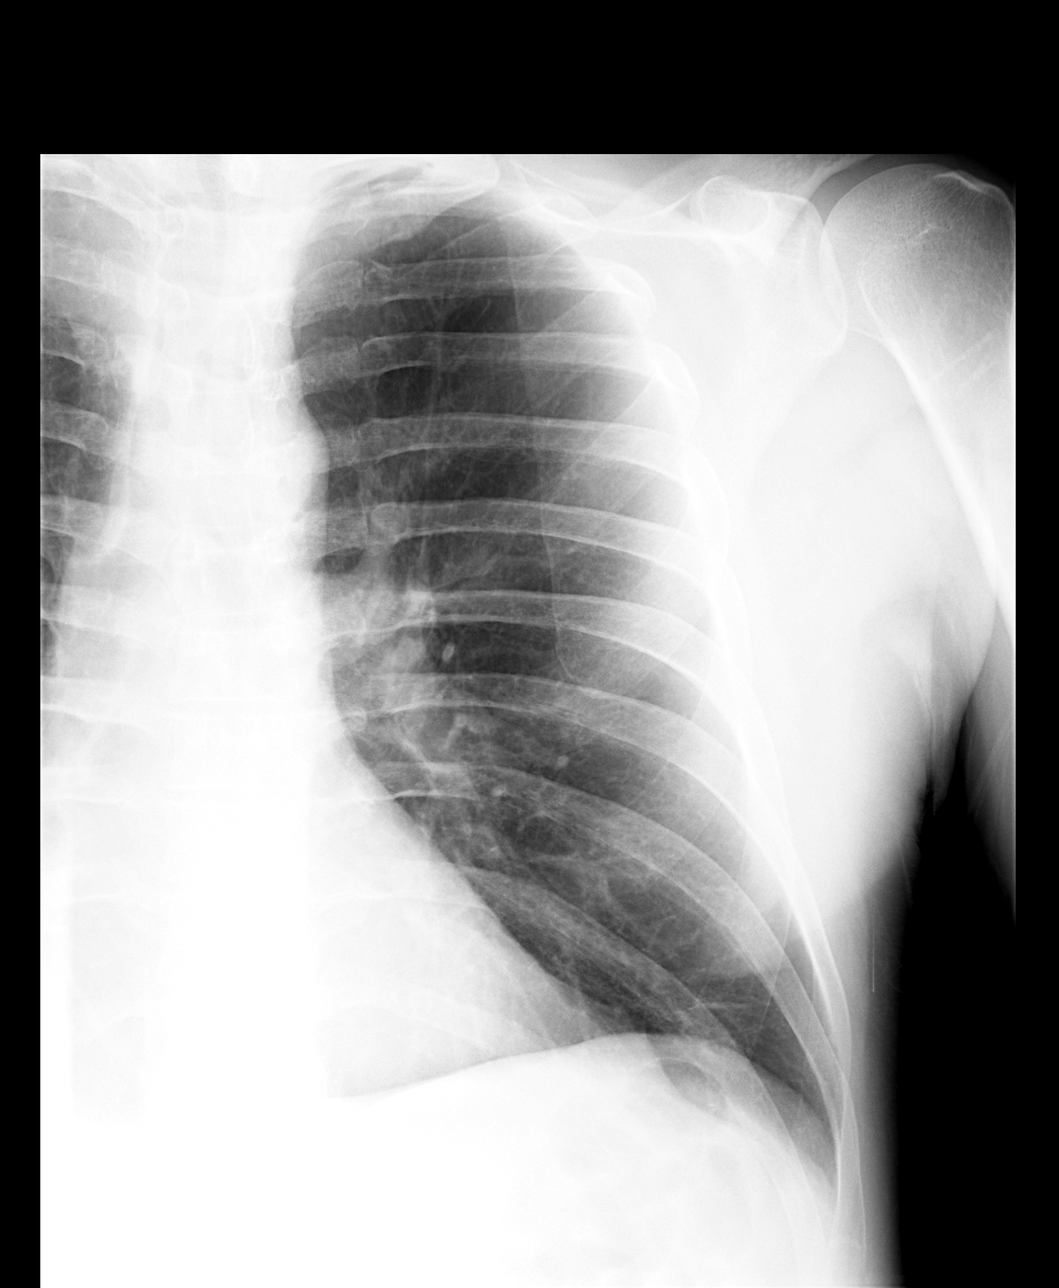

[view not recorded (2 of 4)]
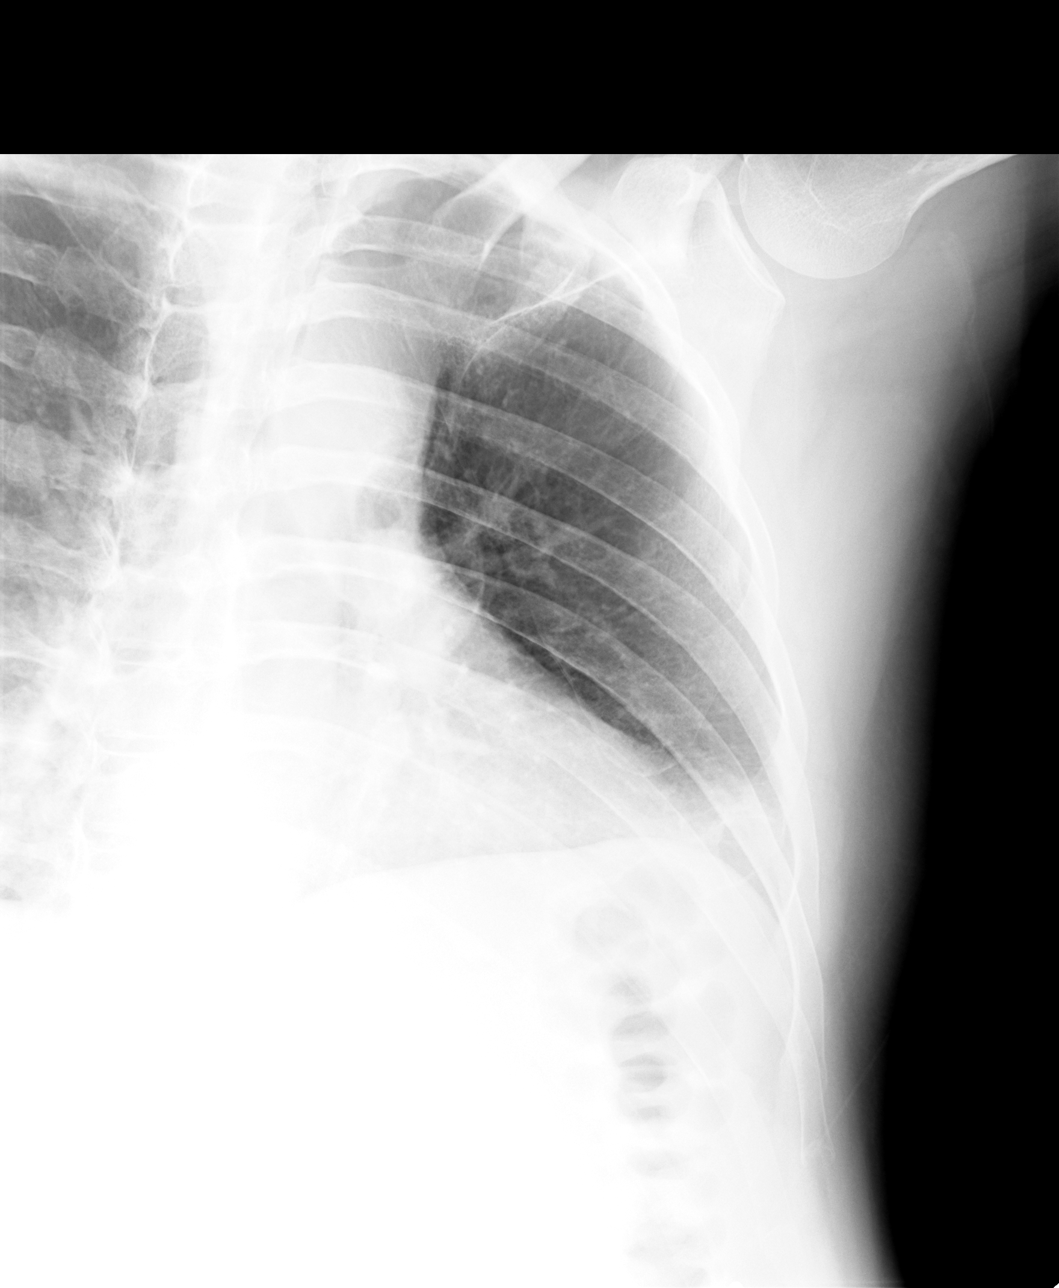

[view not recorded (3 of 4)]
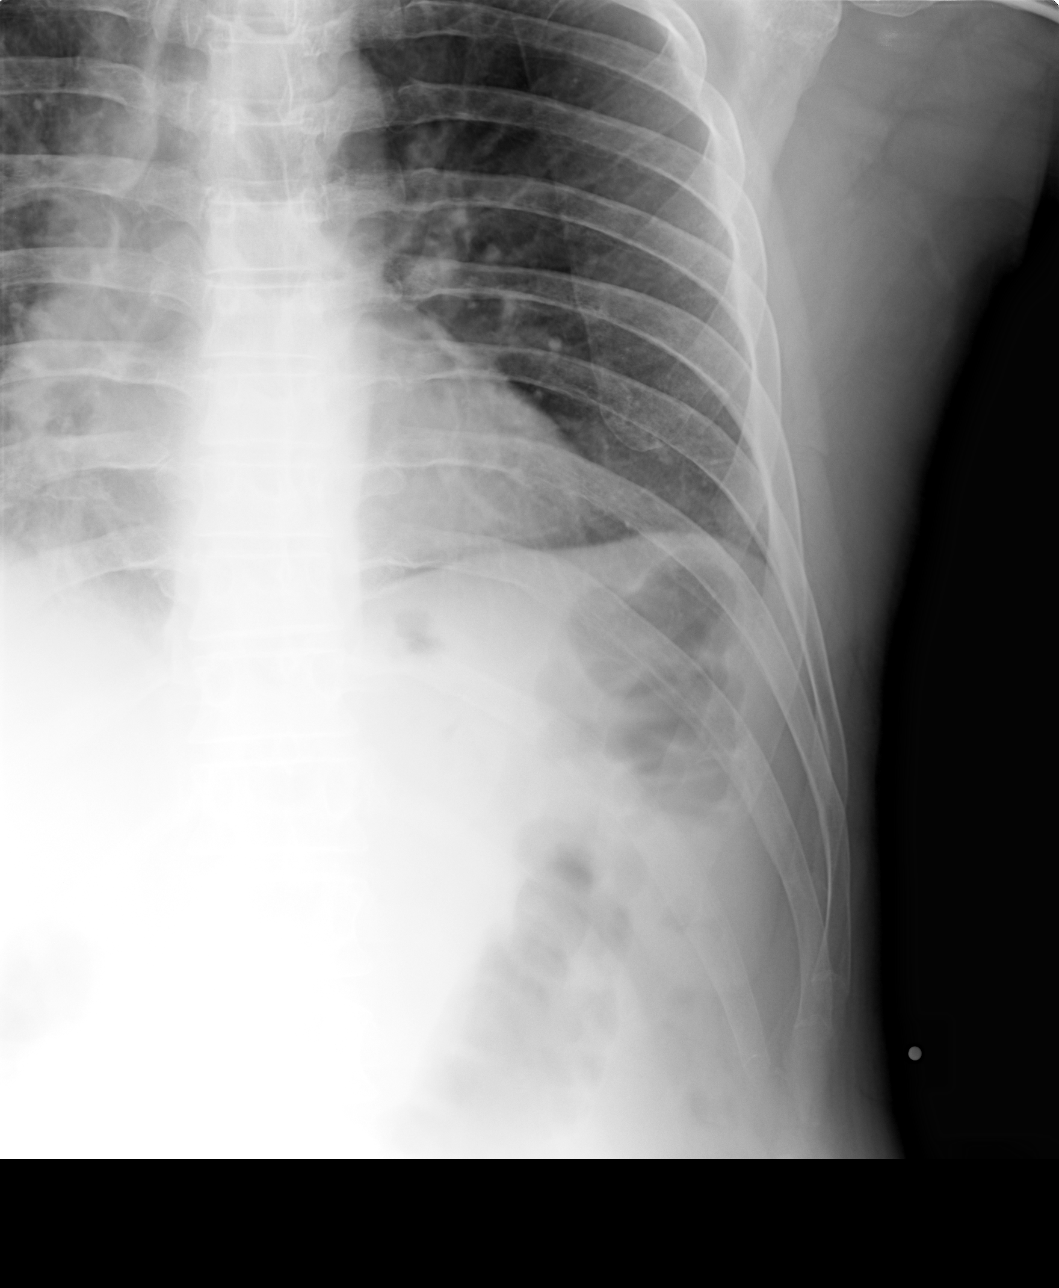

[view not recorded (4 of 4)]
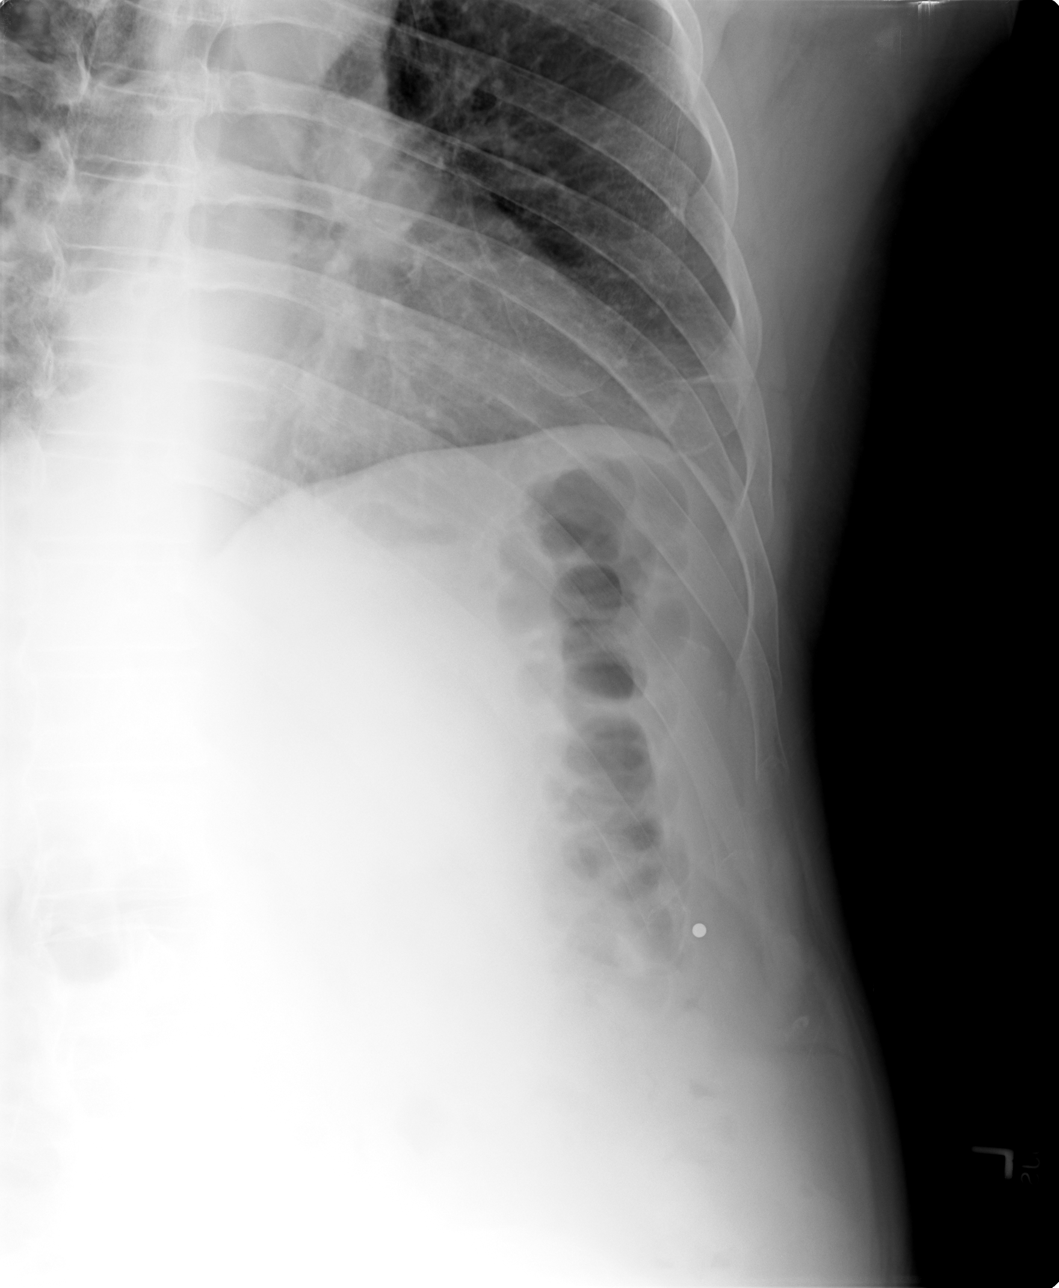

[4 of 4 positions shown; findings below may reference images not displayed]

FINDINGS: Left rib detail films show no acute left rib fracture.
IMPRESSION: Negative left rib detail.

## 2010-01-27 DIAGNOSIS — K219 Gastro-esophageal reflux disease without esophagitis: Secondary | ICD-10-CM | POA: Insufficient documentation

## 2010-01-29 ENCOUNTER — Telehealth: Payer: Self-pay | Admitting: Pulmonary Disease

## 2010-02-12 ENCOUNTER — Encounter: Payer: Self-pay | Admitting: Pulmonary Disease

## 2010-02-25 ENCOUNTER — Telehealth (INDEPENDENT_AMBULATORY_CARE_PROVIDER_SITE_OTHER): Payer: Self-pay | Admitting: *Deleted

## 2010-02-25 DIAGNOSIS — R079 Chest pain, unspecified: Secondary | ICD-10-CM | POA: Insufficient documentation

## 2010-03-08 ENCOUNTER — Ambulatory Visit (HOSPITAL_COMMUNITY): Admission: RE | Admit: 2010-03-08 | Discharge: 2010-03-08 | Payer: Self-pay | Admitting: Pulmonary Disease

## 2010-03-24 ENCOUNTER — Telehealth (INDEPENDENT_AMBULATORY_CARE_PROVIDER_SITE_OTHER): Payer: Self-pay | Admitting: *Deleted

## 2010-03-26 ENCOUNTER — Encounter: Payer: Self-pay | Admitting: Adult Health

## 2010-03-26 ENCOUNTER — Ambulatory Visit: Payer: Self-pay | Admitting: Pulmonary Disease

## 2010-03-26 IMAGING — CR DG RIBS 2V*L*
5 series · 5 of 5 positions shown · non-contrast
Comparison: [DATE]

CLINICAL DATA: Fall.  Left anterior rib pain.

LEFT RIBS - 2 VIEW

[view not recorded (1 of 5)]
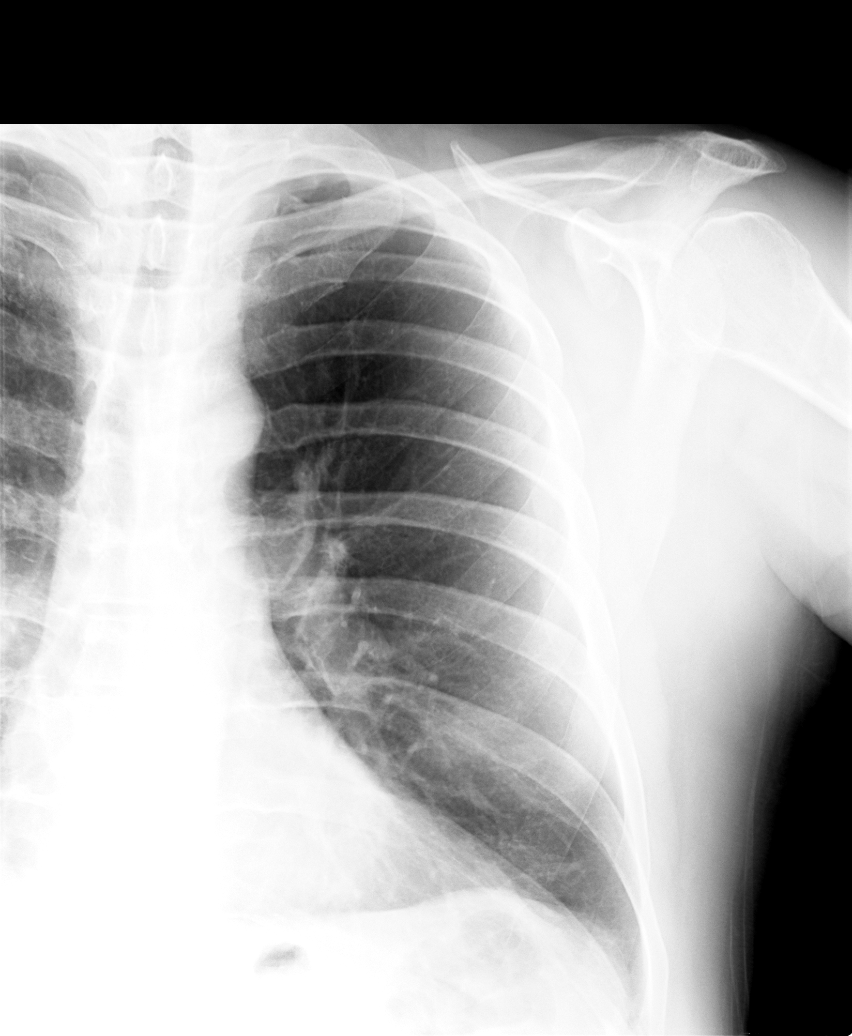

[view not recorded (2 of 5)]
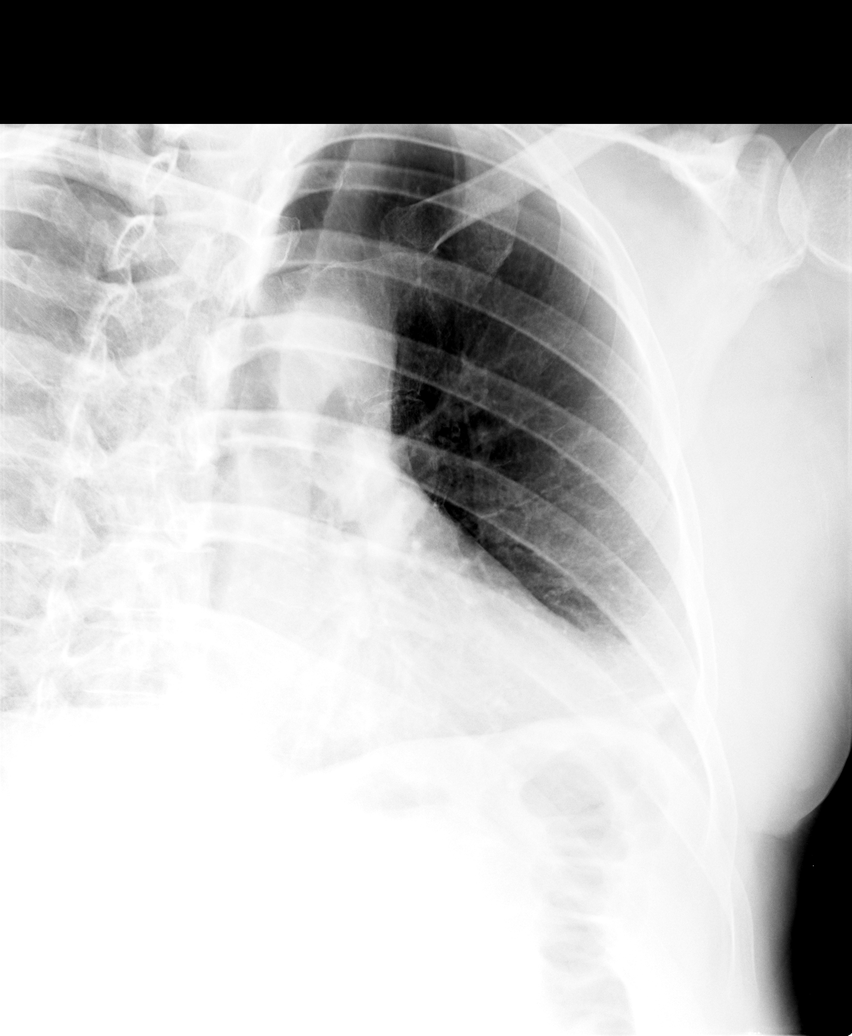

[view not recorded (3 of 5)]
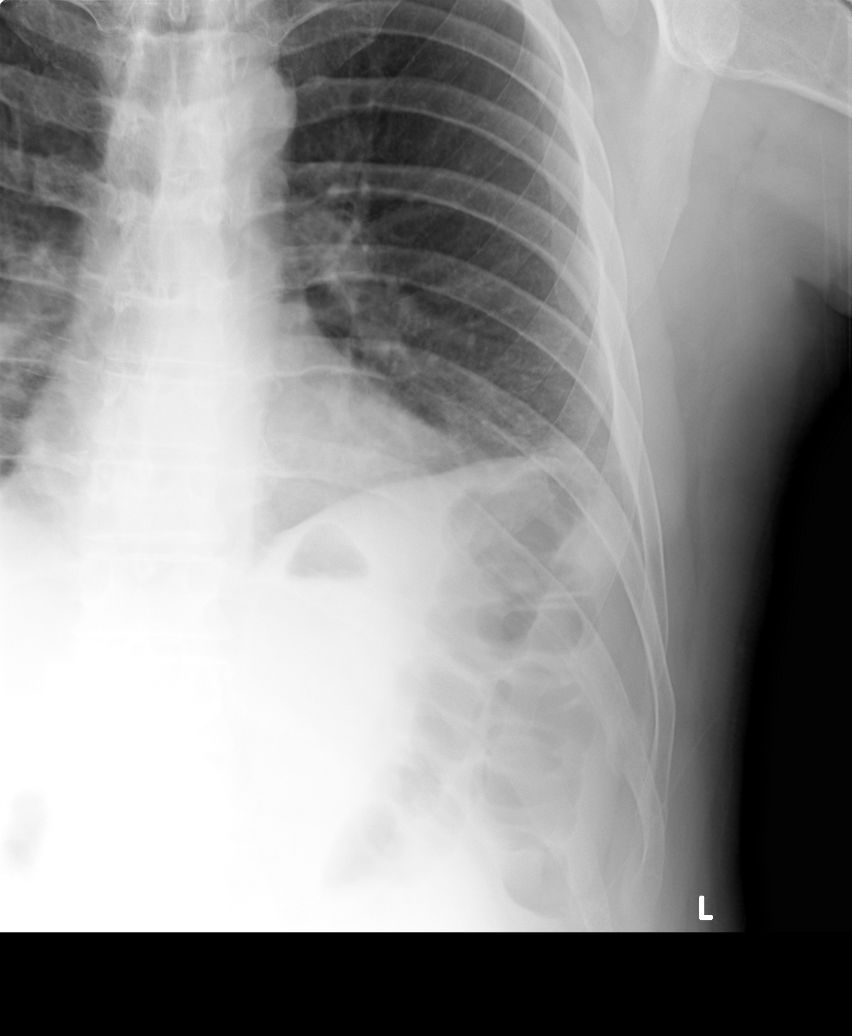

[view not recorded (4 of 5)]
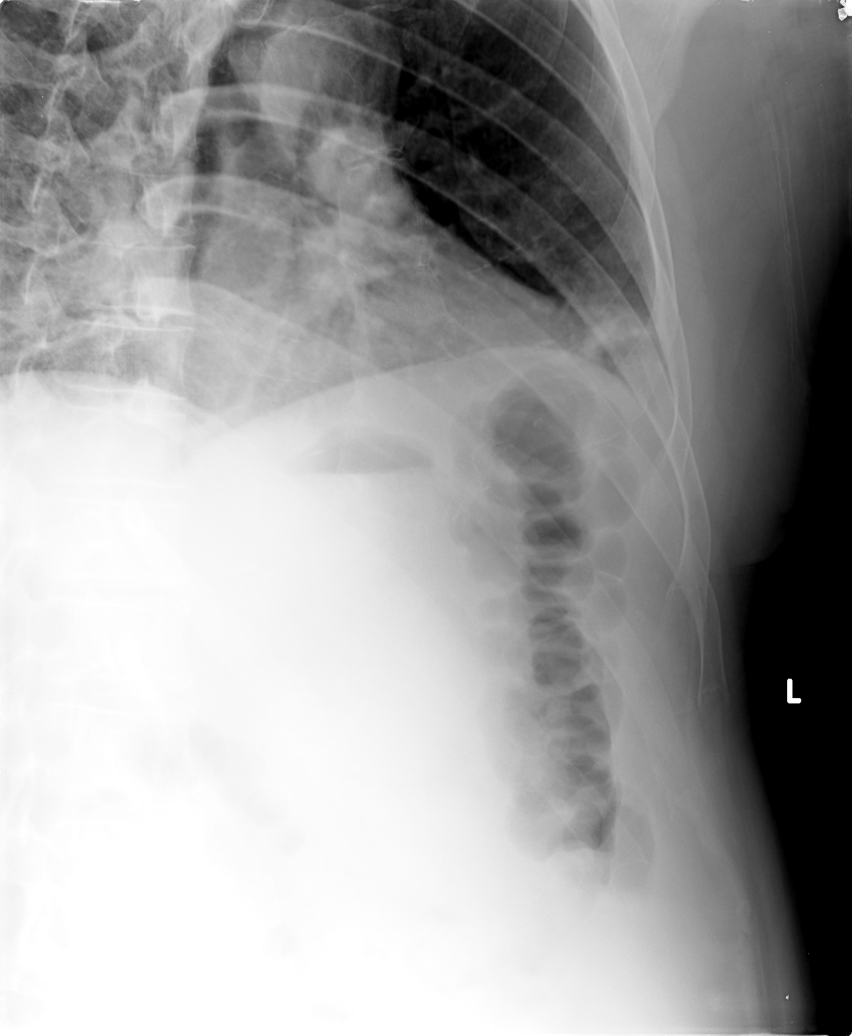

[view not recorded (5 of 5)]
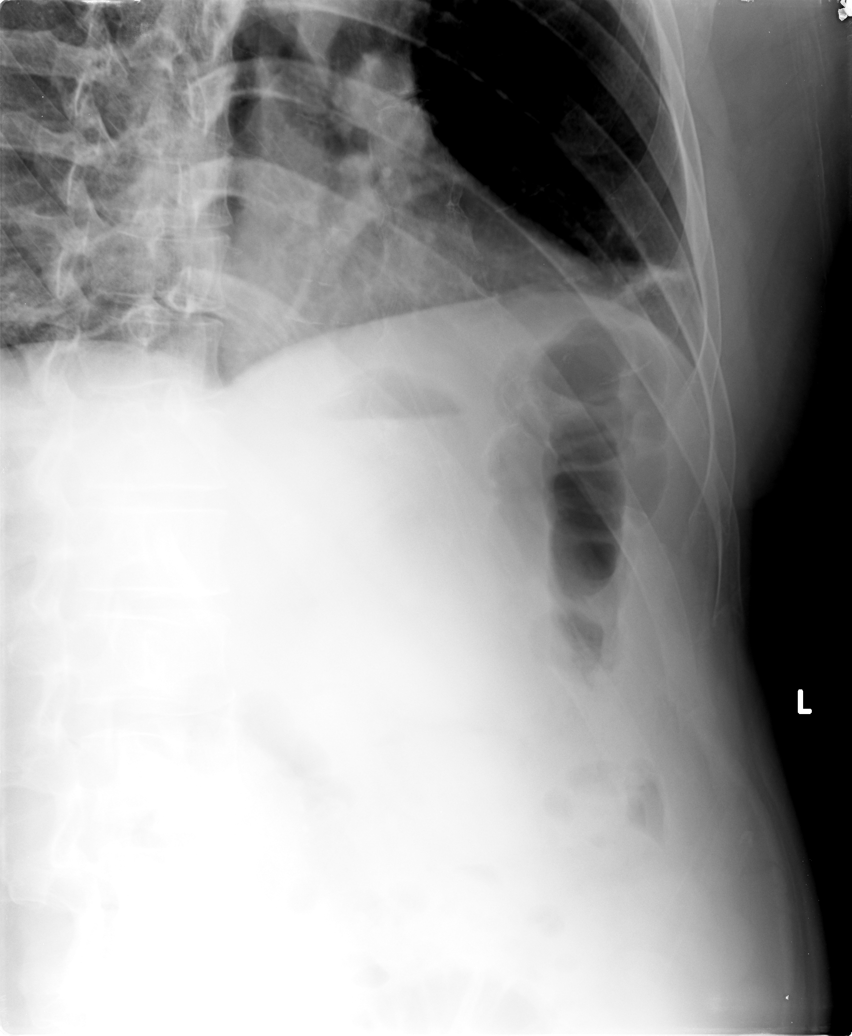

[5 of 5 positions shown; findings below may reference images not displayed]

FINDINGS: Acute minimally displaced fracture of the left tenth rib
anteriorly.  There is a healing fracture of the left ninth rib.
Bony framework is otherwise within normal limits.  No obvious
pneumothorax.
IMPRESSION: Acute anterior left tenth rib fracture.  Prior healing rib
fracture.

## 2010-04-05 ENCOUNTER — Telehealth (INDEPENDENT_AMBULATORY_CARE_PROVIDER_SITE_OTHER): Payer: Self-pay | Admitting: *Deleted

## 2010-04-09 ENCOUNTER — Ambulatory Visit: Payer: Self-pay | Admitting: Pulmonary Disease

## 2010-04-20 ENCOUNTER — Telehealth: Payer: Self-pay | Admitting: Pulmonary Disease

## 2010-05-16 ENCOUNTER — Encounter: Payer: Self-pay | Admitting: Pulmonary Disease

## 2010-05-17 ENCOUNTER — Encounter: Payer: Self-pay | Admitting: Pulmonary Disease

## 2010-05-23 LAB — CONVERTED CEMR LAB
ALT: 30 units/L (ref 0–53)
AST: 25 units/L (ref 0–37)
Albumin: 3.9 g/dL (ref 3.5–5.2)
Alkaline Phosphatase: 128 units/L — ABNORMAL HIGH (ref 39–117)
BUN: 8 mg/dL (ref 6–23)
Basophils Absolute: 0.1 10*3/uL (ref 0.0–0.1)
Basophils Relative: 1.8 % (ref 0.0–3.0)
Bilirubin, Direct: 0.2 mg/dL (ref 0.0–0.3)
CO2: 30 meq/L (ref 19–32)
Calcium: 8.9 mg/dL (ref 8.4–10.5)
Chloride: 103 meq/L (ref 96–112)
Cholesterol: 139 mg/dL (ref 0–200)
Creatinine, Ser: 0.8 mg/dL (ref 0.4–1.5)
Eosinophils Absolute: 0.1 10*3/uL (ref 0.0–0.7)
Eosinophils Relative: 1.5 % (ref 0.0–5.0)
GFR calc Af Amer: 134 mL/min
GFR calc non Af Amer: 111 mL/min
Glucose, Bld: 95 mg/dL (ref 70–99)
HCT: 44.9 % (ref 39.0–52.0)
HDL: 34.7 mg/dL — ABNORMAL LOW (ref >39.0)
Hemoglobin: 15.8 g/dL (ref 13.0–17.0)
LDL Cholesterol: 81 mg/dL (ref 0–99)
Lymphocytes Relative: 33.7 % (ref 12.0–46.0)
MCHC: 35.2 g/dL (ref 30.0–36.0)
MCV: 95.1 fL (ref 78.0–100.0)
Monocytes Absolute: 0.5 10*3/uL (ref 0.1–1.0)
Monocytes Relative: 8.1 % (ref 3.0–12.0)
Neutro Abs: 3.3 10*3/uL (ref 1.4–7.7)
Neutrophils Relative %: 54.9 % (ref 43.0–77.0)
PSA: 0.47 ng/mL (ref 0.10–4.00)
Platelets: 271 10*3/uL (ref 150–400)
Potassium: 4.3 meq/L (ref 3.5–5.1)
RBC: 4.73 M/uL (ref 4.22–5.81)
RDW: 12.5 % (ref 11.5–14.6)
Sodium: 138 meq/L (ref 135–145)
TSH: 1.87 microintl units/mL (ref 0.35–5.50)
Total Bilirubin: 1.5 mg/dL — ABNORMAL HIGH (ref 0.3–1.2)
Total CHOL/HDL Ratio: 4
Total Protein: 6.9 g/dL (ref 6.0–8.3)
Triglycerides: 116 mg/dL (ref 0–149)
VLDL: 23 mg/dL (ref 0–40)
WBC: 6.1 10*3/uL (ref 4.5–10.5)

## 2010-05-27 NOTE — Assessment & Plan Note (Signed)
Summary: NP follow up - left rib pain   CC:  2 week follow up - states rib pain has improved but is still "aggravating."  would like another rx for oxycodone.  states fentanyl doesn't help pain.  History of Present Illness: 50 yo with known history of COPD.      ~  January 26, 2010:  presents w/ 15mo hx left chest wall pain- laterally in posterolat ribs, worse at night, not pleuritic, +sl tender, no known trauma... he states Vicodin w/o help... notes assoc cough & sm amt discolored sputum- advised use Dulera Bid + Mucinex Bid w/ fluids... CXR repeat> COPD, NAD & rib details neg; therefore discussed plan to Rx w/ rest/ heat/ binder/ PERCOET & Augmentin... he wants to incr VALIUM to 5mg  Tid- OK... if discomfort persists then consider bone scan/ neuro cons>>bone scan showed Healing left tenth rib fracture.  March 26, 2010 --Presents for a work in visit. Complains of left rib pain x 1week. 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 He has  pain with breathing, no visible bruising, fail onto trucjk raliling and then onto ground. after he lost his footing on tire. No head injury, no LOC..Denies chest pain, orthopnea, hemoptysis, fever, n/v/d, edema, headache.   April 09, 2010--Returns for follow up . Last ov with re-injury to left ribs w/ #10 rib fx. Tx w/ rest, pain meds. 2 week follow up - states rib pain has improved but is still "aggravating."  would like another rx for oxycodone.  states fentanyl doesn't help pain. Says he can not take fentanyl causes severe sedation and nausea. We have called pharmacy to stop any additional refills. He is starting to feel better but still very painful. Does not feel he can go back to work yet, does [employer]. Denies chest pain, dyspnea, orthopnea, hemoptysis, fever, n/v/d, edema, headache. We discussed tapering pain meds as tolerated.   Medications Prior to  Update: 1)  Dulera 100-5 Mcg/act Aero (Mometasone Furo-Formoterol Fum) .... 2 Inhalations Two Times A Day... 2)  Nexium 40 Mg Cpdr (Esomeprazole Magnesium) .... Take One Capsule By Mouth 30 Mins Prior To Dinner 3)  Oxycodone-Acetaminophen 7.5-500 Mg Tabs (Oxycodone-Acetaminophen) .... Take 1/2 To 1 Tab By Mouth Every 6-8 H As Needed For Pain.Marland KitchenMarland Kitchen 4)  Diazepam 5 Mg Tabs (Diazepam) .... Take 1 Tab By Mouth Three Times A Day As Needed For Nerves or Muscle Spasm... 5)  Fentanyl 50 Mcg/hr Pt72 (Fentanyl) .... Apply One Patch To The Painful Area and Change Every 3d As Directed... 6)  Mucinex Dm 30-600 Mg Xr12h-Tab (Dextromethorphan-Guaifenesin) .... Take 1-2 Tablets Every 12 Hours As Needed  Current Medications (verified): 1)  Mucinex Dm 30-600 Mg Xr12h-Tab (Dextromethorphan-Guaifenesin) .... Take 1-2 Tablets Every 12 Hours As Needed 2)  Dulera 100-5 Mcg/act Aero (Mometasone Furo-Formoterol Fum) .... 2 Inhalations Two Times A Day... 3)  Nexium 40 Mg Cpdr (Esomeprazole Magnesium) .... Take One Capsule By Mouth 30 Mins Prior To Dinner 4)  Oxycodone-Acetaminophen 7.5-500 Mg Tabs (Oxycodone-Acetaminophen) .... Take 1/2 To 1 Tab By Mouth Every 6-8 H As Needed For Pain... 5)  Fentanyl 50 Mcg/hr Pt72 (Fentanyl) .... Apply One Patch To The Painful Area and Change Every 3d As Directed... 6)  Diazepam 5 Mg Tabs (Diazepam) .... Take 1 Tab By Mouth Three Times A Day As Needed For Nerves or Muscle Spasm...  Allergies (verified): No Known  Drug Allergies  Past History:  Past Medical History: Last updated: 01/25/2010  ALLERGIC RHINITIS (ICD-477.9) COPD (ICD-496) ABNORMAL CHEST XRAY (ICD-793.1) CHEST PAIN, ATYPICAL (ICD-786.59) HYPERLIPIDEMIA (ICD-272.4) GERD (ICD-530.81) BACK PAIN, LUMBAR (ICD-724.2) ANXIETY (ICD-300.00)  Past Surgical History: Last updated: 12/11/2009 S/P appendectomy S/P ENT surgery (turbinate reduction) by [name] in 2002   Family History: Last updated: 12/11/2009 1 Brother had  an MI at age 25...  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  states fentanyl doesn't help pain Is Patient Diabetic? No Comments Medications reviewed with patient Daytime contact number verified with patient. Boone Master CNA/MA  April 09, 2010 9:47 AM    Physical Exam  Additional Exam:  WD, WN, 50 y/o WM in NAD... GENERAL:  Alert & oriented; pleasant & cooperative... HEENT:  /AT, EOM-wnl, PERRLA,  EACs-clear, TMs-wnl, NOSE-clear , THROAT-clear & wnl;  NECK:  Supple w/ full ROM; no JVD; normal carotid impulses w/o bruits; no thyromegaly or nodules palpated; no lymphadenopathy. CHEST:  Decr BS bilat but clear to P & A; without wheezes/ rales/ or rhonchi heard... tender along left mid axilla/post ribs. no obvious brusing or deformity noted.  HEART:  Regular Rhythm; without murmurs/ rubs/ or gallops detected... ABDOMEN:  Soft & nontender; normal bowel sounds; no organomegaly or masses palpated... EXT: without deformities, mild arthritic changes; no varicose veins/ venous insuffic/ or edema. NEURO:  CN's intact; motor testing normal; sensory testing normal; gait normal & balance OK. DERM:  No lesions noted; no  rash etc...     Impression & Recommendations:  Problem # 1:  RIB PAIN, LEFT SIDED (ICD-786.50)  Re-injury of left ribs w/ # 10 fx. Pt is slowly improving.  advised on deep breathing and increasing actiivity slowly Plan:  D/C fentanyl.  May return work on April 27, 2010  Warm heat to ribs.  Alternate tylenol and Oxycodone as needed for pain.  Begin to taper off Oxycodone.  Please contact office for sooner follow up if symptoms do not improve or worsen   Orders: Est. Patient Level II (19147)  Complete Medication List: 1)  Dulera 100-5 Mcg/act Aero (Mometasone furo-formoterol fum) .... 2 inhalations two times a day... 2)  Nexium 40 Mg Cpdr (Esomeprazole magnesium) .... Take one capsule by mouth 30 mins prior to dinner 3)  Oxycodone-acetaminophen 7.5-500 Mg Tabs (Oxycodone-acetaminophen) .... Take 1/2 to 1 tab by mouth every 6-8 h as needed for pain.Marland KitchenMarland Kitchen 4)  Diazepam 5 Mg Tabs (Diazepam) .... Take 1 tab by mouth three times a day as needed  for nerves or muscle spasm... 5)  Mucinex Dm 30-600 Mg Xr12h-tab (Dextromethorphan-guaifenesin) .... Take 1-2 tablets every 12 hours as needed  Patient Instructions: 1)  D/C fentanyl.  2)  May return work on April 27, 2010  3)  Warm heat to ribs.  4)  Alternate tylenol and Oxycodone as needed for pain.  5)  Begin to taper off Oxycodone.  6)  Please contact office for sooner follow up if symptoms do not improve or worsen  Prescriptions: OXYCODONE-ACETAMINOPHEN 7.5-500 MG TABS (OXYCODONE-ACETAMINOPHEN) take 1/2 to 1 tab by mouth every 6-8 H as needed for pain...  #30 x 0   Entered and Authorized by:   Rubye Oaks NP   Signed by:   Tammy Parrett NP on 04/09/2010   Method used:   Print then Give to Patient   RxID:   [id number]

## 2010-05-27 NOTE — Progress Notes (Signed)
Summary: refill valium  Medications Added VALIUM 5 MG  TABS (DIAZEPAM) take 1/2 to 1 tablet by mouth three times a day as needed for nerves       Phone Note Call from Patient Call back at [phone]   Caller: Patient Call For: nadel Reason for Call: Talk to Nurse, Talk to Doctor Summary of Call: Valium was written on last visit for 1 a day.  His ins has gone up and he is sometimes having to take 2 a day to sleep at night. Could he write it old way for 1/2 to 1 tablet 3 times a day. qty #90.  That way it would make sure he had enough when he had to take 2. [facility] - [phone] chart ordered Initial call taken by: Eugene Gavia,  April 12, 2007 1:53 PM    New/Updated Medications: VALIUM 5 MG  TABS (DIAZEPAM) take 1/2 to 1 tablet by mouth three times a day as needed for nerves   Prescriptions: VALIUM 5 MG  TABS (DIAZEPAM) take 1/2 to 1 tablet by mouth three times a day as needed for nerves  #90 x 5   Entered by:   Marijo File CMA   Authorized by:   Michele Mcalpine MD   Signed by:   Marijo File CMA on 04/12/2007   Method used:   Telephoned to ...       [facility] [address] [id number]       [address]       [place], [place]  [address]       Ph: [phone]       Fax: [phone]   RxID:   [id number]

## 2010-05-27 NOTE — Progress Notes (Signed)
Summary: more injuries to ribs  Phone Note Call from Patient Call back at Home Phone [phone]   Caller: Patient Call For: nadel Summary of Call: pt has "re-injured his ribs on L side. last thursday pt was at work and fell while at the back of his truck. his says he "probably" broke more ribs on the same side. wants to know if dr Kriste Basque recs that he stay out of work for another wk or 2. if so, he will needs a note that he can pick up. 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  He states that he had a fall x 6 days ago and "lost all ground I had made"- having pain in chest upon inspiration.  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  Would like a note from SN to excuse him from work for 2 wks so that he can heal.  He states if needed he can come in for appt, but wanted to see if SN thought this was needed. Pls advise thanks! Follow-up by: Vernie Murders,  March 24, 2010 1:43 PM  Additional Follow-up for Phone Call Additional follow up Details #1::        per SN----was this not filed under workers comp when he got hurt the first time??  just let me know and we can do another note for him---does he want to pick this up or have it mailed to him?  thanks Randell Loop CMA  March 24, 2010 4:06 PM   ATC pt and NA and unable to leave a voicemail. WCB. Carron Curie CMA  March 24, 2010 4:34 PM     Additional Follow-up for Phone Call Additional follow up Details #2::    ATCx2, na, no voicemail. Carron Curie CMA  March 24, 2010 5:16 PM  Spoke w/ pt and today he says his pain is worse and he is having incr sob. Pt agrees to see TP today at 3:45 to assess possible re-injury of rib fx.    Follow-up by: Michel Bickers CMA,  March 25, 2010 8:58  AM

## 2010-05-27 NOTE — Progress Notes (Signed)
Summary: rx req/ sinus  Phone Note Call from Patient   Caller: Patient Call For: nadel Summary of Call: was was recently seen. today c/o sinus congestion x 1 wk. low-grade fever. green phlegm. "some coughing" but biggest complaint is sinus congestion. pt has used tyleno and 1 box of theraflu OTC. req rx called in to [facility] on [address] pt # [phone] Initial call taken by: Tivis Ringer,  January 09, 2009 11:46 AM  Follow-up for Phone Call        Please advise Abigail Miyamoto RN  January 09, 2009 11:52      per SN---ok for pt to have augmentin 875mg   1 po bid x 7 days   #14  and use mucinex max otc  1 by mouth two times a day with plenty of fluids.  meds have been sent to pts pharmacy and i called and pt is aware of meds. Marijo File CMA  January 09, 2009 3:53 PM     New/Updated Medications: AUGMENTIN 875-125 MG TABS (AMOXICILLIN-POT CLAVULANATE) take one tablet by mouth two times a day until gone MUCINEX MAXIMUM STRENGTH 1200 MG XR12H-TAB (GUAIFENESIN) take one tablet by mouth two times a day with plenty of fluids Prescriptions: AUGMENTIN 875-125 MG TABS (AMOXICILLIN-POT CLAVULANATE) take one tablet by mouth two times a day until gone  #14 x 0   Entered by:   Marijo File CMA   Authorized by:   Michele Mcalpine MD   Signed by:   Marijo File CMA on 01/09/2009   Method used:   Electronically to        [facility] (retail)       [address]       [place], [place]  [address]       Ph: [phone]       Fax: [phone]   RxID:   [id number]

## 2010-05-27 NOTE — Assessment & Plan Note (Signed)
Summary: Acute NP office visit - bronchitis   CC:  chest congestion with green/yellow mucus, sinus pressure/congestion, wheezing, inceased SOB, and sweats x1week.  History of Present Illness: 50 yo with known history of COPD.    December 16, 2008--Presents for an acute office visit. Complains of occasional pain in lower right back x2weeks.  pt states sometimes it takes his breath away and other times he doesn't notice it. 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 Denies chest pain, dyspnea, orthopnea, hemoptysis, fever, n/v/d, edema, headache, urinary symtpoms, bloody stools, ext weakness, radicular symtpms. Worse with bending, after prolonged sitting. Feels stiff.   February 13, 2009 -- c/o 2-3 weeks of nasal congestion, sinus pain and pressure. Had acute ov 9/17 for same c/o.  Took augmentin x 7days, felt slightly better for a few days then had same symptoms return. C/o cough r/t post nasal gtt, purulent nasal discharge, malaise. Also concerned about insomnia.  He is a [employer] - has previously taken valium for help with sleep. No longer effective.  Does not want ambien or other formal sleep aid since he gets up early and is a [employer] architect. Denies fever, chills, hemoptysis, chest pain, orthopnea, PND, recent travel.   Aug 24, 2009--Presents for an acute office visit. Complains of chest congestion with green/yellow mucus, sinus pressure/congestion, wheezing, inceased SOB, sweats x1week. OTC not helping. Worse last 2 days, Cough is worse in night. Denies chest pain, dyspnea, orthopnea, hemoptysis, fever, n/v/d, edema, headache,recent travel or antibiotics.   Medications Prior to Update: 1)  Valium 5 Mg  Tabs (Diazepam) .... Take 1 Tablet By Mouth Three Times A Day As Needed For Nerves 2)  Mucinex Maximum Strength 1200 Mg Xr12h-Tab (Guaifenesin) .... Take One Tablet By Mouth Two Times A Day With Plenty of Fluids 3)  Alprazolam 0.25 Mg Tabs (Alprazolam) .[name].. 1 Tab By Mouth At Bedtime As  Needed  Current Medications (verified): 1)  Valium 5 Mg  Tabs (Diazepam) .... Take 1 Tablet By Mouth Three Times A Day As Needed For Nerves 2)  Mucinex Dm 30-600 Mg Xr12h-Tab (Dextromethorphan-Guaifenesin) .... Take 1-2 Tablets Every 12 Hours As Needed 3)  Fluconazole 200 Mg Tabs (Fluconazole) .[name].. 1 Tab By Mouth Once A Week  Allergies (verified): No Known Drug Allergies  Past History:  Past Surgical History: Last updated: 11/14/2007 S/P appendectomy S/P ENT surgery (turbinate reduction) by [name] in 2002   Family History: Last updated: 08/24/2009 1 Brother had an MI at age 69...  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 risk factors include: +FamHx, Smoking Hx, low HDL, male gender... .. **he saw [name] 5/08- EKG w/ NSR, IRBBB, NSSTTWA...   --stress test 8/09 neg for ischemia.   ALLERGIC RHINITIS (ICD-477.9) - uses OTC meds as needed... prev ENT surgery from [name] in 2002 w/ turbinate reduction...  COPD (ICD-496) - former heavy smoker and quit in 2007... chronic bronchitic symptoms... prev on Symbicort but he stopped this on his own...  ABNORMAL CHEST XRAY (ICD-793.1) - prev CXR's w/ hyperinflation c/w COPD, 1cm RLL density without change- likely scarring...  ~  CTChest 11/07 showed hyperinflation w/ emphysematous changes in the upper lobes, 6mm RML nodule without  change, several other scattered sm nodules, pleural thickening, and ca++ in Abd Ao...   ~  CRX 09- shows hyperinflation, some scarring, NAD...  ~  PFT's today show FVC= 6.61 (135%), FEV1=3.88 (97%), %1sec=59, mid-flows= 44%...  BACK  PAIN, LUMBAR (ICD-724.2) - prev eval from [name]...  ANXIETY (ICD-300.00) - on VALIUM 5mg  as needed... he states Xanax is too strong...  Health Maintenance - FlexSig 1993 showed rectal fissure otherw neg...     Family History: 1 Brother had an MI at age 31...  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 No Comments Medications reviewed with patient Daytime contact number verified with patient. Boone Master CNA  Aug 24, 2009 3:28 PM    Physical Exam  Additional Exam:  WD, WN, 50 y/o WM in NAD... GENERAL:  Alert & oriented; pleasant & cooperative... HEENT:  [place]/AT, EOM-wnl, PERRLA,  EACs-clear, TMs-wnl, NOSE-purulent drainage, turbinates boggy, THROAT-clear & wnl;  NECK:  Supple w/ full ROM; no JVD; normal carotid impulses w/o bruits; no thyromegaly or nodules palpated; no lymphadenopathy. CHEST:  Decr BS bilat but clear to P & A; without wheezes/ rales/ or rhonchi heard...  HEART:  Regular Rhythm; without murmurs/ rubs/ or gallops detected... ABDOMEN:  Soft & nontender; normal bowel sounds; no organomegaly or masses palpated... EXT: without deformities, mild arthritic changes; no varicose veins/ venous insuffic/ or edema. NEURO:  CN's intact; motor testing normal; sensory  testing normal; gait normal & balance OK. DERM:  No lesions noted; no rash etc...     Impression & Recommendations:  Problem # 1:  BRONCHITIS, ACUTE (ICD-466.0)  His updated medication list for this problem includes:    Mucinex Dm 30-600 Mg Xr12h-tab (Dextromethorphan-guaifenesin) .[name]... Take 1-2 tablets every 12 hours as needed    Cefdinir 300 Mg Caps (Cefdinir) .[name]... 1 by mouth two times a day  Orders: Est. Patient Level IV (82956)  Medications Added to Medication List This Visit: 1)  Mucinex Dm 30-600 Mg Xr12h-tab (Dextromethorphan-guaifenesin) .... Take 1-2 tablets every 12 hours as needed 2)  Fluconazole 200 Mg Tabs (Fluconazole) .[name].. 1 tab by mouth once a week 3)  Cefdinir 300 Mg Caps (Cefdinir) .[name].. 1 by mouth two times a day  Complete Medication List: 1)  Valium 5 Mg Tabs (Diazepam) .... Take 1 tablet by mouth three times a day as needed for nerves 2)  Mucinex Dm 30-600 Mg Xr12h-tab (Dextromethorphan-guaifenesin) .... Take 1-2 tablets every 12 hours as needed 3)  Fluconazole 200 Mg Tabs (Fluconazole) .[name].. 1 tab by mouth once a week 4)  Cefdinir 300 Mg Caps (Cefdinir) .[name].. 1 by mouth two  times a day  Patient Instructions: 1)  Omnicef 300 two times a day for 7 days 2)  Mucinex DM two times a day as needed cough/congestion  3)  Increase fluids.  4)  Please contact office for sooner follow up if symptoms do not improve or worsen  5)  follow up Dr. Kriste Basque for physical.  Prescriptions: CEFDINIR 300 MG CAPS (CEFDINIR) 1 by mouth two times a day  #14 x 0   Entered and Authorized by:   Rubye Oaks NP   Signed by:   Rubye Oaks NP on 08/24/2009   Method used:   Electronically to        [facility]       [address]       [place], [place]  [address]       Ph: [phone]       Fax: [phone]   RxID:   [id number]     Appended Document: Acute NP office visit - bronchitis   Medication Administration  Medication # 1:     Medication: Xopenex 1.25mg     Diagnosis: BRONCHITIS, ACUTE (ICD-466.0)    Dose: 1    Route: po    Exp Date: 12/24/2009    Lot #: [id number]    Mfr: [id number]    Patient tolerated medication without complications    Given by: Denna Haggard, CMA (Aug 24, 2009 4:37 PM)  Orders Added: 1)  Xopenex 1.25mg  [K7425]

## 2010-05-27 NOTE — Medication Information (Signed)
Summary: Tax adviser   Imported By: Valinda Hoar 02/12/2010 15:02:48  _____________________________________________________________________  External Attachment:    Type:   Image     Comment:   External Document

## 2010-05-27 NOTE — Assessment & Plan Note (Signed)
Summary: Acute NP office visit - rib pain from fall   CC:  states fell x8days ago at home while loading his truck for Thanksgiving - fell on left ribs.  pain with breathing and no visible bruising.  History of Present Illness: 50 yo with known history of COPD.      ~  January 26, 2010:  presents w/ 58mo hx left chest wall pain- laterally in posterolat ribs, worse at night, not pleuritic, +sl tender, no known trauma... he states Vicodin w/o help... notes assoc cough & sm amt discolored sputum- advised use Dulera Bid + Mucinex Bid w/ fluids... CXR repeat> COPD, NAD & rib details neg; therefore discussed plan to Rx w/ rest/ heat/ binder/ PERCOET & Augmentin... he wants to incr VALIUM to 5mg  Tid- OK... if discomfort persists then consider bone scan/ neuro cons>>bone scan showed Healing left tenth rib fracture.  March 26, 2010 --Presents for a work in visit. Complains of left rib pain x 1week. 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 He has  pain with breathing, no visible bruising, fail onto trucjk raliling and then onto ground. after he lost his footing on tire. No head injury, no LOC..Denies chest pain, orthopnea, hemoptysis, fever, n/v/d, edema, headache.   Medications Prior to Update: 1)  Mucinex Dm 30-600 Mg Xr12h-Tab (Dextromethorphan-Guaifenesin) .... Take 1-2 Tablets Every 12 Hours As Needed 2)  Dulera 100-5 Mcg/act Aero (Mometasone Furo-Formoterol Fum) .... 2 Inhalations Two Times A Day... 3)  Nexium 40 Mg Cpdr (Esomeprazole Magnesium) .... Take One Capsule By Mouth 30 Mins Prior To Dinner 4)  Oxycodone-Acetaminophen 7.5-500 Mg Tabs (Oxycodone-Acetaminophen) .... Take 1/2 To 1 Tab By Mouth Every 6-8 H As Needed For Pain... 5)  Fentanyl 50 Mcg/hr Pt72 (Fentanyl) .... Apply One Patch To The Painful Area and Change Every 3d As Directed... 6)  Diazepam 5 Mg Tabs (Diazepam) .... Take 1 Tab By Mouth Three Times  A Day As Needed For Nerves or Muscle Spasm... 7)  Augmentin 875-125 Mg Tabs (Amoxicillin-Pot Clavulanate) .... Take 1 Tab By Mouth Two Times A Day...  Current Medications (verified): 1)  Mucinex Dm 30-600 Mg Xr12h-Tab (Dextromethorphan-Guaifenesin) .... Take 1-2 Tablets Every 12 Hours As Needed 2)  Dulera 100-5 Mcg/act Aero (Mometasone Furo-Formoterol Fum) .... 2 Inhalations Two Times A Day... 3)  Nexium 40 Mg Cpdr (Esomeprazole Magnesium) .... Take One Capsule By Mouth 30 Mins Prior To Dinner 4)  Oxycodone-Acetaminophen 7.5-500 Mg Tabs (Oxycodone-Acetaminophen) .... Take 1/2 To 1 Tab By Mouth Every 6-8 H As Needed For Pain... 5)  Fentanyl 50 Mcg/hr Pt72 (Fentanyl) .... Apply One Patch To The Painful Area and Change Every 3d As Directed... 6)  Diazepam 5 Mg Tabs (Diazepam) .... Take 1 Tab By Mouth Three Times A Day As Needed For Nerves or Muscle Spasm...  Allergies (verified): No Known Drug Allergies  Past History:  Past Medical History: Last updated: 01/25/2010  ALLERGIC RHINITIS (ICD-477.9) COPD (ICD-496) ABNORMAL CHEST XRAY (ICD-793.1) CHEST PAIN, ATYPICAL (ICD-786.59) HYPERLIPIDEMIA (ICD-272.4) GERD (ICD-530.81) BACK PAIN, LUMBAR (ICD-724.2) ANXIETY (ICD-300.00)  Past Surgical History: Last updated: 12/11/2009 S/P appendectomy S/P ENT surgery (turbinate reduction) by [name] in 2002   Family History: Last updated: 12/11/2009 1 Brother had an MI at age 79...  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 GENERAL:  Alert & oriented; pleasant & cooperative... HEENT:  [place]/AT, EOM-wnl, PERRLA,  EACs-clear, TMs-wnl, NOSE-clear , THROAT-clear & wnl;  NECK:  Supple w/ full ROM; no JVD; normal carotid impulses w/o bruits; no thyromegaly or nodules palpated; no lymphadenopathy. CHEST:  Decr BS bilat but clear to P & A; without wheezes/ rales/ or rhonchi heard... tender along left mid axilla/post ribs. no obvious brusing or deformity noted.  HEART:  Regular Rhythm; without murmurs/ rubs/ or gallops detected... ABDOMEN:  Soft & nontender; normal bowel sounds; no organomegaly or masses palpated... EXT: without deformities, mild arthritic changes; no varicose veins/ venous insuffic/ or edema. NEURO:  CN's intact; motor testing normal; sensory testing normal; gait normal & balance OK. DERM:  No lesions noted; no rash etc...     Impression & Recommendations:  Problem # 1:  RIB PAIN, LEFT SIDED (ICD-786.50) Xray today shows an acute anterior left tenth rib fracture.  Prior healing rib fracture. Plan:  Warm heat to ribs.  Advil 200mg  3 tabs two times a day for 7 days Hydrocodone as needed for pain. --severe pain  Please contact office for sooner follow up if symptoms do not improve or worsen  follow up 2 weeks as needed   Orders: T-Ribs Unilateral 2 Views (71100TC) Est. Patient Level IV (16109)   Patient Instructions: 1)  Warm heat to ribs.  2)  Advil 200mg  3 tabs two times a day for 7 days 3)  Hydrocodone as needed for pain. --severe pain  4)  Please contact office for sooner follow up if symptoms do not improve or worsen  5)  follow up 2 weeks  6)  I will call with xray results.

## 2010-05-27 NOTE — Progress Notes (Signed)
Summary: New return to work note  Phone Note Call from Patient Call back at [phone] cell, [phone] home   Caller: Patient Call For: Kriste Basque Summary of Call: Pt states he needs a new note for his job stating okay to return to work on full duty no restrictions Apr 27, 2010. I asked pt does he feel he can return to work full duty and he states he can. Pt is scheduled to take a  physical test to return to work 04/23/10 fax [phone] Attn to [employer] has been out of the office.  Initial call taken by: Zackery Barefoot CMA,  April 20, 2010 4:41 PM  Follow-up for Phone Call        called and spoke with pt  and he stated that he just needed the note that was given to him printed out and faxed stating that he is able to return to work without restrictions on Apr 27, 2010,  this has been faxed per pts request [address] [facility]  April 20, 2010 4:56 PM

## 2010-05-27 NOTE — Letter (Signed)
Summary: [employer]  [address]   [place], [place] [address]   Phone: [phone]  Fax: [phone]    Today's Date: March 26, 2010  Name of Patient: Richard Li  The above named patient had a medical visit today at:  9:45 am.  Please take this into consideration when reviewing the time away from work.    Special Instructions:  [  ] None  [  ] To be off the remainder of today, returning to the normal work / school schedule tomorrow.  [ X ] To be off until the next scheduled appointment on April 09, 2010.  [  ] Other ________________________________________________________________ ________________________________________________________________________   Sincerely yours,       Tammy Parrett N.P.

## 2010-05-27 NOTE — Progress Notes (Signed)
Summary: FMLA forms mailed to office > sent to [facility]  Phone Note Call from Patient Call back at Home Phone [phone]   Summary of Call: patient had FMLA forms mailed to the office for TP to fill out / sign.  FMLA not done on the floor.  papers placed on dumb-waiter personally with a note to send to [facility].  Initial call taken by: Boone Master CNA/MA,  April 05, 2010 11:13 AM

## 2010-05-27 NOTE — Assessment & Plan Note (Signed)
Summary: recheck rib pain after meds/la   CC:  6 week ROV & add-on for persistant left side pain....  History of Present Illness: 50 y/o WM here for a follow up visit & CPX...   ~  [name]:  he was a very heavy smoker and quit in 2007... he has moderate COPD but remarkably preserved PFT's in the past... he was on Symbicort but stopped this on his own...he complains of increased SOB in the heat, and after eating...  notes min cough,  scant sputum, no hemoptysis, no wheezing/ snoring/ daytime hypersomnolence, etc... he notes vague chest discomfort on & off- not always activity related... we discussed pursuing Pulm & Cardiac testing for completeness...  he takes Valium Qhs but wonders about increasing this med...   ~  December 11, 2009:  81yr ROV & doing satis but still notes some SOB w/ activity in hot weather... he's tried Advair & Spiriva but he states that he didn't like them & the TV advertisment scared him... he notes mild cough, min sputum (clear), no CP/ palpit, etc... unfortunately he has gained weight from 203 to 230# at present... we discussed diet + exercise & weight reduction + sample of DULERA 100- 2sp Bid...   ~  January 26, 2010:  presents w/ 84mo hx left chest wall pain- laterally in posterolat ribs, worse at night, not pleuritic, +sl tender, no known trauma... he states Vicodin w/o help... notes assoc cough & sm amt discolored sputum- advised use Dulera Bid + Mucinex Bid w/ fluids... CXR repeat> COPD, NAD & rib details neg; therefore discussed plan to Rx w/ rest/ heat/ binder/ PERCOET & Augmentin... he wants to incr VALIUM to 5mg  Tid- OK... if discomfort persists then consider bone scan/ neuro consult/ pain management.   Current Problem List:  CHEST PAIN, ATYPICAL (ICD-786.59) - no prev hx of cardiac problems... risk factors include: +FamHx, Smoking Hx, low HDL, male gender... we will screen w/ a Stress Myoview... **he saw [name] 5/08- EKG w/ NSR, IRBBB, NSSTTWA... Myoview was done 8/09  & showed good exerc capacity, normal BP response, no signif EKG changes, normal images w/o ischemia/ scar & EF= 58%...  ~  EKG 8/11 shows NSR, WNL...  ALLERGIC RHINITIS (ICD-477.9) - uses OTC meds as needed... prev ENT surgery from [name] in 2002 w/ turbinate reduction... he has prominent parotid hypertrophy bilat.  COPD (ICD-496) - former heavy smoker and quit in 2007... chronic bronchitic symptoms... prev on Symbicort but he stopped this on his own & refuses Spiriva due to side effects discussed on TV ads...   ~  8/11:  started on DULERA 100-5 2spBid...  ABNORMAL CHEST XRAY (ICD-793.1) - prev CXR's w/ hyperinflation c/w COPD, 1cm RLL density without change- likely scarring...  ~  CTChest 11/07 showed hyperinflation w/ emphysematous changes in the upper lobes, 6mm RML nodule without change, several other scattered sm nodules, pleural thickening, and ca++ in Abd Ao...   ~  baseline CXR shows hyperinflation, some scarring, NAD...  ~  PFT's 7/09 showed FVC= 6.61 (135%), FEV1=3.88 (97%), %1sec=59, mid-flows= 44%.  ~  CXR 8/11 showed COPD w/ hyperaeration, min scarring, NAD> confirmed 10/11 & Left Rib details= neg.  HYPERLIPIDEMIA (ICD-272.4) - on diet therapy alone w/ incr TG assoc w/ recent weight gain... discussed diet + exercise.  ~  FLP 8/09 (wt=203#) showed TChol 139, TG 116, HDL 35, LDL 81  ~  FLP 8/11 (wt=230#) showed TChol 145, TG 247, HDL 33, LDL 68... advised diet + exercise, get wt down.  GERD (ICD-530.81) - on NEXIUM 40mg  Prn for reflux symptoms...  BACK PAIN, LUMBAR (ICD-724.2) - prev eval from [name]...  ANXIETY (ICD-300.00) - on VALIUM 5mg  as needed... he states Xanax is too strong...  Health Maintenance - FlexSig 1993 showed rectal fissure otherw neg...   Allergies (verified): No Known Drug Allergies  Comments:  Nurse/Medical Assistant: The patient's medications and allergies were reviewed with the patient and were updated in the Medication and Allergy  Lists.  Past History:  Past Medical History:  ALLERGIC RHINITIS (ICD-477.9) COPD (ICD-496) ABNORMAL CHEST XRAY (ICD-793.1) CHEST PAIN, ATYPICAL (ICD-786.59) HYPERLIPIDEMIA (ICD-272.4) GERD (ICD-530.81) BACK PAIN, LUMBAR (ICD-724.2) ANXIETY (ICD-300.00)  Family History: Reviewed history from 12/11/2009 and no changes required. 1 Brother had an MI at age 23...  emphysema/ COPD - maternal uncles allergies - daughter asthma - daughter heart disease - maternal uncles cancer - mother (breast)  Social History: Reviewed history from 12/11/2009 and no changes required. 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  The patient denies anorexia, fever, weight loss, weight gain, vision loss, decreased hearing, hoarseness, syncope, peripheral edema, prolonged cough, headaches, hemoptysis, abdominal pain, melena, hematochezia, severe indigestion/heartburn, hematuria, incontinence, muscle weakness, suspicious skin lesions, transient blindness, difficulty walking, depression, unusual weight change, abnormal bleeding, enlarged lymph nodes, and angioedema.    Vital Signs:  Patient profile:   50 year old male Height:      70 inches Weight:      235.25 pounds BMI:     33.88 O2 Sat:      96 % on Room air Temp:     97.3 degrees F oral Pulse rate:   86 / minute BP sitting:   140 / 88  (right arm) Cuff size:   regular  Vitals Entered By: Randell Loop CMA (January 25, 2010 3:03 PM)  O2 Sat at Rest %:  96 O2 Flow:  Room air  Physical Exam  Additional Exam:  WD, WN, 50 y/o WM in NAD... GENERAL:  Alert & oriented; pleasant & cooperative... HEENT:  [place]/AT, EOM-wnl, PERRLA,  EACs-clear, TMs-wnl, NOSE-purulent drainage, turbinates boggy, THROAT-clear & wnl;  NECK:  Supple w/ fairROM; no JVD; normal carotid impulses w/o bruits; no thyromegaly or  nodules palpated; no lymphadenopathy. CHEST:  Decr BS bilat but clear to P & A; without wheezes/ rales/ or rhonchi heard...  sl tender left lat/ post chest wall over ribs... HEART:  Regular Rhythm; without murmurs/ rubs/ or gallops detected... ABDOMEN:  Soft & nontender; normal bowel sounds; no organomegaly or masses palpated... EXT: without deformities, mild arthritic changes; no varicose veins/ venous insuffic/ or edema. NEURO:  CN's intact; motor testing normal; sensory testing normal; gait normal & balance OK. DERM:  No lesions noted; no rash, several tatoos...    CXR  Procedure date:  01/25/2010  Findings:      CHEST - 2 VIEW Comparison: Chest x-ray of 12/11/2009   Findings:  The lungs are clear but hyperaerated consistent with COPD. No infiltrate or effusion is seen.  Mediastinal contours are stable.  The heart is within normal limits in size.  There is mild peribronchial thickening present.  No bony abnormality is seen.   IMPRESSION: Hyperaeration consistent with COPD.  Mild peribronchial thickening. No definite active process.   Read By:  Juline Patch,  M.D.  Comments:      LEFT RIBS - 2 VIEW Comparison: Chest x-ray of 12/11/2009   Findings: Left rib detail films show no acute left rib fracture.   IMPRESSION: Negative left rib detail.   Read By:  Juline Patch,  M.D.   Impression & Recommendations:  Problem # 1:  OTHER CHEST PAIN (ICD-786.59) He has left lat chest wall pain of ?etiology... we discussed plan to Rx w/ rest, heat, rib binder & PERCOCET 5/500 Q6H as needed... we will Rx short term w/ DURAGESIC-50 to break the pain cycle... he wants to incr Valium to 5mg  Tid as well- OK... we disussed further eval w/ Bone Scan, then poss Neuro/ vs Pain Management eval if the pain persisits unremitting... Orders: T-2 View CXR (71020TC) T-Ribs Unilateral 2 Views (71100TC)  Problem # 2:  BRONCHITIS, ACUTE (ICD-466.0) We will add Augmentin for his green sputum  & advised regular dosing w/ Dulera & Mucinex... His updated medication list for this problem includes:    Mucinex Dm 30-600 Mg Xr12h-tab (Dextromethorphan-guaifenesin) .[name]... Take 1-2 tablets every 12 hours as needed    Dulera 100-5 Mcg/act Aero (Mometasone furo-formoterol fum) .[name]... 2 inhalations two times a day...    Augmentin 875-125 Mg Tabs (Amoxicillin-pot clavulanate) .[name]... Take 1 tab by mouth two times a day...  Problem # 3:  HYPERLIPIDEMIA (ICD-272.4) On diet alone...  Problem # 4:  BACK PAIN, LUMBAR (ICD-724.2) Aware... we will use the Duragesic for a short interval, + Prn Percocet... His updated medication list for this problem includes:    Oxycodone-acetaminophen 7.5-500 Mg Tabs (Oxycodone-acetaminophen) .[name]... Take 1/2 to 1 tab by mouth every 6-8 h as needed for pain...    Fentanyl 50 Mcg/hr Pt72 (Fentanyl) .[name]... Apply one patch to the painful area and change every 3d as directed...  Problem # 5:  ANXIETY (ICD-300.00) He requests incr Valium to 5mg Tid... His updated medication list for this problem includes:    Diazepam 5 Mg Tabs (Diazepam) .[name]... Take 1 tab by mouth three times a day as needed for nerves or muscle spasm...  Complete Medication List: 1)  Mucinex Dm 30-600 Mg Xr12h-tab (Dextromethorphan-guaifenesin) .... Take 1-2 tablets every 12 hours as needed 2)  Dulera 100-5 Mcg/act Aero (Mometasone furo-formoterol fum) .... 2 inhalations two times a day... 3)  Nexium 40 Mg Cpdr (Esomeprazole magnesium) .... Take one capsule by mouth 30 mins prior to dinner 4)  Oxycodone-acetaminophen 7.5-500 Mg Tabs (Oxycodone-acetaminophen) .... Take 1/2 to 1 tab by mouth every 6-8 h as needed for pain.Marland KitchenMarland Kitchen 5)  Fentanyl 50 Mcg/hr Pt72 (Fentanyl) .... Apply one patch to the painful area and change every 3d as directed... 6)  Diazepam 5 Mg Tabs (Diazepam) .... Take 1 tab by mouth three times a day as needed for nerves or muscle spasm... 7)  Augmentin 875-125 Mg Tabs (Amoxicillin-pot clavulanate)  .... Take 1 tab by mouth two times a day...  Other Orders: Admin 1st Vaccine (11914) Flu Vaccine 55yrs + [phone])  Patient Instructions: 1)  Today we updated your med list- see below.... 2)  We decided to treat the bronchial infection w/ AUGMENTIN- take 1 tab twice daily x10d til gone...  3)  Add MUCINEX 2 tabs twice daily & lots of fluids.Marland KitchenMarland Kitchen 4)  For the Side Pain:  we will check rib detail films today;  and treat the area w/ the [facility] (change it every 3d) and wrote a new perscription for a stronger pain pill= PERCOCET (Oxycodone) to take up to 3 per day  for severe discomfort (caution w/ driving).Marland KitchenMarland Kitchen 5)  If the XRays are neg & the pain persists:  the next step is a BONE SCAN (call & we will set this up)... and the step after that is to consider a NEUROLOGY CONSULT for further eval... 6)  Let's plan a follow up visit in 4-6 weeks to check your progress... Prescriptions: AUGMENTIN 875-125 MG TABS (AMOXICILLIN-POT CLAVULANATE) take 1 tab by mouth two times a day...  #20 x 0   Entered and Authorized by:   Michele Mcalpine MD   Signed by:   Michele Mcalpine MD on 01/25/2010   Method used:   Print then Give to Patient   RxID:   [id number] DIAZEPAM 5 MG TABS (DIAZEPAM) take 1 tab by mouth three times a day as needed for nerves or muscle spasm...  #100 x 6   Entered and Authorized by:   Michele Mcalpine MD   Signed by:   Michele Mcalpine MD on 01/25/2010   Method used:   Print then Give to Patient   RxID:   [id number] FENTANYL 50 MCG/HR PT72 (FENTANYL) apply one patch to the painful area and change every 3d as directed...  #2 boxes x 0   Entered and Authorized by:   Michele Mcalpine MD   Signed by:   Michele Mcalpine MD on 01/25/2010   Method used:   Print then Give to Patient   RxID:   [id number] OXYCODONE-ACETAMINOPHEN 7.5-500 MG TABS (OXYCODONE-ACETAMINOPHEN) take 1/2 to 1 tab by mouth every 6-8 H as needed for pain...  #100 x 0   Entered and Authorized by:   Michele Mcalpine MD   Signed  by:   Michele Mcalpine MD on 01/25/2010   Method used:   Print then Give to Patient   RxID:   [id number]     Flu Vaccine Consent Questions     Do you have a history of severe allergic reactions to this vaccine? no    Any prior history of allergic reactions to egg and/or gelatin? no    Do you have a sensitivity to the preservative Thimersol? no    Do you have a past history of Guillan-Barre Syndrome? no    Do you currently have an acute febrile illness? no    Have you ever had a severe reaction to latex? no    Vaccine information given and explained to patient? yes    Are you currently pregnant? no    Lot Number:[id number]   Exp Date:10/23/2010   Site Given  Left Deltoid IMflu   Randell Loop CMA  January 25, 2010 5:06 PM

## 2010-05-27 NOTE — Progress Notes (Signed)
Summary: rib pain/ CT?  Phone Note Call from Patient   Caller: Patient Call For: nadel Summary of Call: pt says he is still having a lot of L rib pain. says dr Kriste Basque told him to let him know if this continued. pt wants to see if dr Kriste Basque recs a CT. pt is off work next tues/ wed/ and thurs. [phone] Initial call taken by: Tivis Ringer, CNA,  February 25, 2010 10:12 AM  Follow-up for Phone Call        called spoke with patient who c/o increased left rib pain x4days - used to be mainly at night, but is now bothering him during the day and becoming more acute with coughing and sneezing.  states has been wrapping his chest with an ace bandage, has taken off of work this week to rest and has been using the fentanyl and percocet, though the fentanyl causes bad n/v.  pt would like know SN's recs - rest and call back with update, ov, or "bone scan."  please advise, thanks! Boone Master CNA/MA  February 25, 2010 10:34 AM   Additional Follow-up for Phone Call Additional follow up Details #1::        per SN---recs are for pt to have nuclear bone scan for left rib pain.  thanks Randell Loop CMA  February 25, 2010 1:53 PM   New Problems: RIB PAIN, LEFT SIDED (ICD-786.50)   Additional Follow-up for Phone Call Additional follow up Details #2::    Called and spoke with pt and notified that order was sent to [facility] for bone scan. Pt verbalized understanding. Follow-up by: Vernie Murders,  February 25, 2010 2:00 PM  New Problems: RIB PAIN, LEFT SIDED (ICD-786.50)

## 2010-05-27 NOTE — Assessment & Plan Note (Signed)
Summary: PHYSICAL ///KP   CC:  2 year ROV & CPX....  History of Present Illness: 50 y/o WM here for a follow up visit & CPX...   ~  [name]:  he was a very heavy smoker and quit in 2007... he has moderate COPD but remarkably preserved PFT's in the past... he was on Symbicort but stopped this on his own...he complains of increased SOB in the heat, and after eating...  notes min cough,  scant sputum, no hemoptysis, no wheezing/ snoring/ daytime hypersomnolence, etc... he notes vague chest discomfort on & off- not always activity related... we discussed pursuing Pulm & Cardiac testing for completeness...  he takes Valium Qhs but wonders about increasing this med...   ~  December 11, 2009:  1yr ROV & doing satis but still notes some SOB w/ activity in hot weather... he's tried Advair & Spiriva but he states that he didn't like them & the TV advertisment scared him... he notes mild cough, min sputum (clear), no CP/ palpit, etc... unfortunately he has gained weight from 203 to 230# at present... we discussed diet + exercise & weight reduction + sample of DULERA 100- 2sp Bid...   Current Problem List:  CHEST PAIN, ATYPICAL (ICD-786.59) - no prev hx of cardiac problems... risk factors include: +FamHx, Smoking Hx, low HDL, male gender... we will screen w/ a Stress Myoview... **he saw [name] 5/08- EKG w/ NSR, IRBBB, NSSTTWA... Myoview was done 8/09 & showed good exerc capacity, normal BP response, no signif EKG changes, normal images w/o ischemia/ scar & EF= 58%...  ~  EKG 8/11 shows NSR, WNL...  ALLERGIC RHINITIS (ICD-477.9) - uses OTC meds as needed... prev ENT surgery from [name] in 2002 w/ turbinate reduction...  COPD (ICD-496) - former heavy smoker and quit in 2007... chronic bronchitic symptoms... prev on Symbicort but he stopped this on his own & refusesd Spiriva due to side effects discussed on TV ads...  ABNORMAL CHEST XRAY (ICD-793.1) - prev CXR's w/ hyperinflation c/w COPD, 1cm RLL density  without change- likely scarring...  ~  CTChest 11/07 showed hyperinflation w/ emphysematous changes in the upper lobes, 6mm RML nodule without change, several other scattered sm nodules, pleural thickening, and ca++ in Abd Ao...   ~  CRX today shows hyperinflation, some scarring, NAD...  ~  PFT's today show FVC= 6.61 (135%), FEV1=3.88 (97%), %1sec=59, mid-flows= 44%...  ~  CXR 8/11 showed COPD w/ hyperaeration, min scarring, NAD...  HYPERLIPIDEMIA (ICD-272.4) - on diet therapy alone w/ incr TG assoc w/ recent weight gain... diiscussed diet + exercise.  ~  FLP 8/09 (wt=203#) showed TChol 139, TG 116, HDL 35, LDL 81  ~  FLP 8/11 (wt=230#) showed TChol 145, TG 247, HDL 33, LDL 68... advised diet + exercise, get wt down.  BACK PAIN, LUMBAR (ICD-724.2) - prev eval from [name]...  ANXIETY (ICD-300.00) - on VALIUM 5mg  as needed... he states Xanax is too strong...  Health Maintenance - FlexSig 1993 showed rectal fissure otherw neg...   Preventive Screening-Counseling & Management  Alcohol-Tobacco     Smoking Status: quit     Year Quit: 2007  Allergies (verified): No Known Drug Allergies  Comments:  Nurse/Medical Assistant: The patient's medications and allergies were reviewed with the patient and were updated in the Medication and Allergy Lists.  Past History:  Past Medical History: ALLERGIC RHINITIS (ICD-477.9) COPD (ICD-496) ABNORMAL CHEST XRAY (ICD-793.1) CHEST PAIN, ATYPICAL (ICD-786.59) HYPERLIPIDEMIA (ICD-272.4) BACK PAIN, LUMBAR (ICD-724.2) ANXIETY (ICD-300.00)  Past Surgical History: S/P appendectomy S/P  ENT surgery (turbinate reduction) by [name] in 2002   Family History: Reviewed history from 08/24/2009 and no changes required. 1 Brother had an MI at age 21...  emphysema/ COPD - maternal uncles allergies - daughter asthma - daughter heart disease - maternal uncles cancer - mother (breast)  Social History: Reviewed history from 08/24/2009 and no  changes required. 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  The patient denies fever, chills, sweats, anorexia, fatigue, weakness, malaise, weight loss, sleep disorder, blurring, diplopia, eye irritation, eye discharge, vision loss, eye pain, photophobia, earache, ear discharge, tinnitus, decreased hearing, nasal congestion, nosebleeds, sore throat, hoarseness, chest pain, palpitations, syncope, orthopnea, PND, peripheral edema, cough, dyspnea at rest, excessive sputum, hemoptysis, wheezing, pleurisy, nausea, vomiting, diarrhea, constipation, change in bowel habits, abdominal pain, melena, hematochezia, jaundice, gas/bloating, indigestion/heartburn, dysphagia, odynophagia, dysuria, hematuria, urinary frequency, urinary hesitancy, nocturia, incontinence, back pain, joint pain, joint swelling, muscle cramps, muscle weakness, stiffness, arthritis, sciatica, restless legs, leg pain at night, leg pain with exertion, rash, itching, dryness, suspicious lesions, paralysis, paresthesias, seizures, tremors, vertigo, transient blindness, frequent falls, frequent headaches, difficulty walking, depression, anxiety, memory loss, confusion, cold intolerance, heat intolerance, polydipsia, polyphagia, polyuria, unusual weight change, abnormal bruising, bleeding, enlarged lymph nodes, urticaria, allergic rash, hay fever, and recurrent infections.    Vital Signs:  Patient profile:   50 year old male Height:      70 inches Weight:      230.25 pounds O2 Sat:      97 % on Room air Temp:     98.2 degrees F oral Pulse rate:   74 / minute BP sitting:   120 / 80  (left arm) Cuff size:   regular  Vitals Entered By: Randell Loop CMA (December 11, 2009 9:30 AM)  O2 Sat at Rest %:  97 O2 Flow:  Room air CC: 2 year ROV & CPX... Is Patient Diabetic? No Pain Assessment Patient in pain? no       Comments MEDS UPDATED TODAY WITH PT   Physical Exam  Additional Exam:  WD, WN, 50 y/o WM in NAD... GENERAL:  Alert & oriented; pleasant & cooperative... HEENT:  [place]/AT, EOM-wnl, PERRLA,  EACs-clear, TMs-wnl, NOSE-purulent drainage, turbinates boggy, THROAT-clear & wnl;  NECK:  Supple w/ fairROM; no JVD; normal carotid impulses w/o bruits; no thyromegaly or nodules palpated; no lymphadenopathy. CHEST:  Decr BS bilat but clear to P & A; without wheezes/ rales/ or rhonchi heard...  HEART:  Regular Rhythm; without murmurs/ rubs/ or gallops detected... ABDOMEN:  Soft & nontender; normal bowel sounds; no organomegaly or masses palpated... EXT: without deformities, mild arthritic changes; no varicose veins/ venous insuffic/ or edema. NEURO:  CN's intact; motor testing normal; sensory testing normal; gait normal & balance OK. DERM:  No lesions noted; no rash, several tatoos...    CXR  Procedure date:  12/11/2009  Findings:      CHEST - 2 VIEW Comparison: Chest x-ray of 11/14/2007   Findings: The lungs are hyperaerated consistent with COPD.  Minimal scarring at the right costophrenic angle appears stable.  No active infiltrate or effusion is seen.  Mediastinal contours are normal.  The heart is within normal limits in size.  No bony abnormality is seen.   IMPRESSION: COPD.  No active lung disease.   Read By:  Juline Patch,  M.D.  EKG  Procedure date:  12/11/2009  Findings:      Normal sinus rhythm with rate of:  72/ min... Tracing is WNL, NAD, borderline rsr' in V1...  SN   MISC. Report  Procedure date:  12/11/2009  Findings:      BMP (METABOL)   Sodium                    139 mEq/L                   135-145   Potassium                 4.4 mEq/L                   3.5-5.1   Chloride                  103 mEq/L                   96-112   Carbon Dioxide            27 mEq/L                    19-32   Glucose                   84 mg/dL                    19-14   BUN                        11 mg/dL                    7-82   Creatinine                0.8 mg/dL                   9.5-6.2   Calcium                   9.0 mg/dL                   1.3-08.6   GFR                       115.86 mL/min               >60  Hepatic/Liver Function Panel (HEPATIC)   Total Bilirubin      [H]  1.4 mg/dL                   5.7-8.4   Direct Bilirubin          0.2 mg/dL                   6.9-6.2   Alkaline Phosphatase      109 U/L                     39-117   AST                       32 U/L                      0-37   ALT  44 U/L                      0-53   Total Protein             7.2 g/dL                    0.1-0.2   Albumin                   4.2 g/dL                    7.2-5.3  CBC Platelet w/Diff (CBCD)   White Cell Count          7.0 K/uL                    4.5-10.5   Red Cell Count            4.99 Mil/uL                 4.22-5.81   Hemoglobin                16.5 g/dL                   66.4-40.3   Hematocrit                46.7 %                      39.0-52.0   MCV                       93.6 fl                     78.0-100.0   Platelet Count            238.0 K/uL                  150.0-400.0   Neutrophil %              53.9 %                      43.0-77.0   Lymphocyte %              33.8 %                      12.0-46.0   Monocyte %                10.2 %                      3.0-12.0   Eosinophils%              1.4 %                       0.0-5.0   Basophils %               0.7 %                       0.0-3.0  Comments:      Lipid Panel (LIPID)   Cholesterol               145 mg/dL  0-200   Triglycerides        [H]  247.0 mg/dL                 1.6-109.6   HDL                  [L]  04.54 mg/dL                 >09.81 Cholesterol LDL - Direct                             68.3 mg/dL  TSH (TSH)   FastTSH                   1.69 uIU/mL                 0.35-5.50  Prostate Specific Antigen (PSA)   PSA-Hyb                   0.45  ng/mL                  0.10-4.00  UDip w/Micro (URINE)   Order Note: rare sperm seen   Color                     YELLOW   Clarity                   CLEAR                       Clear   Specific Gravity          1.020                       1.000 - 1.030   Urine Ph                  7.0                         5.0-8.0   Protein                   TRACE                       Negative   Urine Glucose             NEGATIVE                    Negative   Ketones                   NEGATIVE                    Negative   Urine Bilirubin           NEGATIVE                    Negative   Blood                     NEGATIVE                    Negative   Urobilinogen              1.0  0.0 - 1.0   Leukocyte Esterace        NEGATIVE                    Negative   Nitrite                   NEGATIVE                    Negative   Urine WBC                 0-2/hpf                     0-2/hpf   Urine RBC                 0-2/hpf                     0-2/hpf  Impression & Recommendations:  Problem # 1:  PHYSICAL EXAMINATION (ICD-V70.0)  Orders: T-2 View CXR (71020TC) TLB-BMP (Basic Metabolic Panel-BMET) (80048-METABOL) TLB-Hepatic/Liver Function Pnl (80076-HEPATIC) TLB-CBC Platelet - w/Differential (85025-CBCD) TLB-Lipid Panel (80061-LIPID) TLB-TSH (Thyroid Stimulating Hormone) (84443-TSH) TLB-PSA (Prostate Specific Antigen) (84153-PSA) TLB-Udip w/ Micro (81001-URINE)  Problem # 2:  HYPERLIPIDEMIA (ICD-272.4) Increased TG w/ his weight gain>  discussed low chol/ low FAT diet & get weight down...  Problem # 3:  COPD (ICD-496) Exsmoker, underlying airways dis> try DULERA 100 (he hasn't seen this one on TV)... His updated medication list for this problem includes:    Dulera 100-5 Mcg/act Aero (Mometasone furo-formoterol fum) .[name]... 2 inhalations two times a day...  Problem # 4:  CHEST PAIN, ATYPICAL (ICD-786.59) No further chest pain, no angina and neg Myoview as noted...  Problem #  5:  ANXIETY (ICD-300.00) He requests refill of the Valium... His updated medication list for this problem includes:    Diazepam 5 Mg Tabs (Diazepam) .[name]... Take 1 tab by mouth at bedtime as needed sleep...  Complete Medication List: 1)  Mucinex Dm 30-600 Mg Xr12h-tab (Dextromethorphan-guaifenesin) .... Take 1-2 tablets every 12 hours as needed 2)  Diazepam 5 Mg Tabs (Diazepam) .... Take 1 tab by mouth at bedtime as needed sleep.Marland KitchenMarland Kitchen 3)  Dulera 100-5 Mcg/act Aero (Mometasone furo-formoterol fum) .... 2 inhalations two times a day...  Other Orders: EKG w/ Interpretation (93000)  Patient Instructions: 1)  Today we updated your med list- see below.... 2)  We decided to try a new inhaler to help your breathing> DULERA 2 sprays twice daily.Marland KitchenMarland Kitchen 3)  Today we did your follow up CXR, EKG, & fasting blood work... please call the "phone tree" in a few days for your lab results.Marland KitchenMarland Kitchen 4)  Let's get on track w/ our diet & work on weight reduction- it will definitely help your breathing to lose some weight! 5)  Call for any problems.Marland KitchenMarland Kitchen 6)  Please schedule a follow-up appointment in 1 year, sooner as needed. Prescriptions: DIAZEPAM 5 MG TABS (DIAZEPAM) take 1 tab by mouth at bedtime as needed sleep...  #100 x 5   Entered and Authorized by:   Michele Mcalpine MD   Signed by:   Michele Mcalpine MD on 12/11/2009   Method used:   Print then Give to Patient   RxID:   [id number] DULERA 100-5 MCG/ACT AERO (MOMETASONE FURO-FORMOTEROL FUM) 2 inhalations two times a day...  #1 x prn   Entered and Authorized by:   Michele Mcalpine MD   Signed by:  Michele Mcalpine MD on 12/11/2009   Method used:   Print then Give to Patient   RxID:   [id number]      CardioPerfect ECG  ID: [id number] Patient: Richard Li, Richard Li DOB: 1960-11-30 Age: 50 Years Old Sex: Male Race: White Physician: scott nadel Technician: Randell Loop CMA Height: 70 Weight: 230.25 Status: Unconfirmed Past Medical History:  CHEST PAIN,  ATYPICAL (ICD-786.59) - no prev hx of cardiac problems... risk factors include: +FamHx, Smoking Hx, low HDL, male gender... .. **he saw [name] 5/08- EKG w/ NSR, IRBBB, NSSTTWA...   --stress test 8/09 neg for ischemia.   ALLERGIC RHINITIS (ICD-477.9) - uses OTC meds as needed... prev ENT surgery from [name] in 2002 w/ turbinate reduction...  COPD (ICD-496) - former heavy smoker and quit in 2007... chronic bronchitic symptoms... prev on Symbicort but he stopped this on his own...  ABNORMAL CHEST XRAY (ICD-793.1) - prev CXR's w/ hyperinflation c/w COPD, 1cm RLL density without change- likely scarring...  ~  CTChest 11/07 showed hyperinflation w/ emphysematous changes in the upper lobes, 6mm RML nodule without change, several other scattered sm nodules, pleural thickening, and ca++ in Abd Ao...   ~  CRX 09- shows hyperinflation, some scarring, NAD...  ~  PFT's today show FVC= 6.61 (135%), FEV1=3.88 (97%), %1sec=59, mid-flows= 44%...  BACK PAIN, LUMBAR (ICD-724.2) - prev eval from [name]...  ANXIETY (ICD-300.00) - on VALIUM 5mg  as needed... he states Xanax is too strong...  Health Maintenance - FlexSig 1993 showed rectal fissure otherw neg...    Recorded: 12/11/2009 09:42 AM P/PR: 105 ms / 193 ms - Heart rate (maximum exercise) QRS: 100 QT/QTc/QTd: 397 ms / 420 ms / 53 ms - Heart rate (maximum exercise)  P/QRS/T axis: -5 deg / 79 deg / 63 deg - Heart rate (maximum exercise)  Heartrate: 73 bpm  Interpretation:  Normal sinus rhythm with rate of:  72/ min... Tracing is WNL, NAD, borderline rsr' in V1...  SN

## 2010-05-27 NOTE — Progress Notes (Signed)
Summary: congestion/ sinus  Phone Note Call from Patient Call back at [phone]   Caller: Patient Call For: nadel Summary of Call: sinus infection green congestion [facility] Initial call taken by: Rickard Patience,  Aug 31, 2009 11:04 AM  Follow-up for Phone Call        pt saw TP on 08-24-09 and was given cefdinir. Pt states he feels slightly better however he still has productive cough with green phlegm, head still very congested, still feels "awful." Pt has finished cefdinir yesterday. Please advise.Carron Curie CMA  Aug 31, 2009 11:27 AM   Additional Follow-up for Phone Call Additional follow up Details #1::        per SN---needs stronger abx---call in avelox 400mg   #7  1 by mouth once daily until gone   thanks Randell Loop CMA  Aug 31, 2009 12:18 PM     Additional Follow-up for Phone Call Additional follow up Details #2::    Spoke with pt and made aware that rx for avelox has been sent to pharm. Follow-up by: Vernie Murders,  Aug 31, 2009 2:21 PM  New/Updated Medications: AVELOX 400 MG TABS (MOXIFLOXACIN HCL) 1 by mouth once daily until gone Prescriptions: AVELOX 400 MG TABS (MOXIFLOXACIN HCL) 1 by mouth once daily until gone  #7 x 0   Entered by:   Vernie Murders   Authorized by:   Michele Mcalpine MD   Signed by:   Vernie Murders on 08/31/2009   Method used:   Electronically to        [facility] (retail)       [address]       [place], [place]  [address]       Ph: [phone]       Fax: [phone]   RxID:   [id number]

## 2010-05-27 NOTE — Assessment & Plan Note (Signed)
Summary: rov/bloodwork/apc   Chief Complaint:  8 month ROV....  History of Present Illness: 50 y/o WM here for a follow up visit... he was a very heavy smoker and quit in 2007... he has moderate COPD but remarkably preserved PFT's in the past... he was on Symbicort but stopped this on his own...he complains of increased SOB in the heat, and after eating...  notes min cough,  scant sputum, no hemoptysis, no wheezing/ snoring/ daytime hypersomnolence, etc... he notes vague chest discomfort on & off- not always activity related... we discussed pursuing Pulm & Cardiac testing for completeness...  he takes Valium Qhs but wonders about increasing this med...    Current Problem List:  CHEST PAIN, ATYPICAL (ICD-786.59) - no prev hx of cardiac problems... risk factors include: +FamHx, Smoking Hx, low HDL, male gender... we will screen w/ a Stress Myoview... **he saw [name] 5/08- EKG w/ NSR, IRBBB, NSSTTWA... Myoview was planned but never done**  ALLERGIC RHINITIS (ICD-477.9) - uses OTC meds as needed... prev ENT surgery from [name] in 2002 w/ turbinate reduction...  COPD (ICD-496) - former heavy smoker and quit in 2007... chronic bronchitic symptoms... prev on Symbicort but he stopped this on his own...  ABNORMAL CHEST XRAY (ICD-793.1) - prev CXR's w/ hyperinflation c/w COPD, 1cm RLL density without change- likely scarring...  ~  CTChest 11/07 showed hyperinflation w/ emphysematous changes in the upper lobes, 6mm RML nodule without change, several other scattered sm nodules, pleural thickening, and ca++ in Abd Ao...   ~  CRX today shows hyperinflation, some scarring, NAD...  ~  PFT's today show FVC= 6.61 (135%), FEV1=3.88 (97%), %1sec=59, mid-flows= 44%...  BACK PAIN, LUMBAR (ICD-724.2) - prev eval from [name]...  ANXIETY (ICD-300.00) - on VALIUM 5mg  as needed... he states Xanax is too strong...  Health Maintenance - FlexSig 1993 showed rectal fissure otherw neg...       Current  Allergies (reviewed today): No known allergies   Past Medical History:        CHEST PAIN, ATYPICAL (ICD-786.59)    ALLERGIC RHINITIS (ICD-477.9)    COPD (ICD-496)    ABNORMAL CHEST XRAY (ICD-793.1)    BACK PAIN, LUMBAR (ICD-724.2)    ANXIETY (ICD-300.00)      Past Surgical History:    S/P appendectomy    S/P ENT surgery (turbinate reduction) by [name] in 2002    Family History:    1 Brother had an MI at age 19...  Social History:    quit smoking in 2007    Review of Systems       The patient complains of chest pain and dyspnea on exertion.  The patient denies anorexia, fever, weight loss, weight gain, vision loss, decreased hearing, hoarseness, syncope, peripheral edema, prolonged cough, headaches, hemoptysis, abdominal pain, melena, hematochezia, severe indigestion/heartburn, hematuria, incontinence, muscle weakness, suspicious skin lesions, transient blindness, difficulty walking, depression, unusual weight change, abnormal bleeding, enlarged lymph nodes, and angioedema.     Vital Signs:  Patient Profile:   50 Years Old Male Height:     70 inches Weight:      203.25 pounds O2 Sat:      97 % O2 treatment:    Room Air Temp:     97.7 degrees F oral Pulse rate:   83 / minute BP sitting:   120 / 70  (left arm)  Vitals Entered By: Marijo File CMA (November 14, 2007 4:13 PM)  Physical Exam  WD, WN, 50 y/o WM in NAD... GENERAL:  Alert & oriented; pleasant & cooperative... HEENT:  [place]/AT, EOM-wnl, PERRLA,  EACs-clear, TMs-wnl, NOSE-clear, THROAT-clear & wnl; +Bilat parotid hypertrophy... NECK:  Supple w/ full ROM; no JVD; normal carotid impulses w/o bruits; no thyromegaly or nodules palpated; no lymphadenopathy. CHEST:  Decr BS bilat but clear to P & A; without wheezes/ rales/ or rhonchi heard...  HEART:  Regular Rhythm; without murmurs/ rubs/ or gallops detected... ABDOMEN:  Soft & nontender; normal bowel sounds; no organomegaly or masses  palpated... EXT: without deformities, mild arthritic changes; no varicose veins/ venous insuffic/ or edema. NEURO:  CN's intact; motor testing normal; sensory testing normal; gait normal & balance OK. DERM:  No lesions noted; no rash etc...     CXR  Procedure date:  11/14/2007  Findings:      CHEST - 2 VIEW   Comparison: 08/14/2006   Findings: Trachea is midline.  Heart size normal.  Lungs appear emphysematous with pleural parenchymal scarring at the right costophrenic angle.  No pleural fluid.   IMPRESSION: No acute findings.   Read By:  Reyes Ivan.,  M.D.       Pulmonary Function Test Date: 11/14/2007 Height (in.): 70 Gender: Male  Pre-Spirometry FVC    Value: 6.61 L/min   Pred: 4.87 L/min     % Pred: 135 % FEV1    Value: 3.88 L     Pred: 3.98 L     % Pred: 97 % FEV1/FVC  Value: 59 %     Pred: - %     % Pred: - % FEF 25-75  Value: 1.83 L/min   Pred: - L/min     % Pred: 44 %  Comments: Well preserved flows- c/w mild-mod obstruction...    Impression & Recommendations:  Problem # 1:  COPD (ICD-496) He has mod COPD and heavy former smoking hx... he stopped his symbicort on his own and is not inclined to restart this med... we may need to force the issue in the future... for now- recheck CXR, PFT's, and sched his StressTest...   Orders: Spirometry w/Graph (94010) T-2 View CXR, Same Day (71020.5TC)   Problem # 2:  CHEST PAIN, ATYPICAL (ICD-786.59) We need to get his StressTest done in light of his coronary risk factors...   Orders: ETT (ETT)   Problem # 3:  ANXIETY (ICD-300.00) Refill his Valium for Tid use...  His updated medication list for this problem includes:    Valium 5 Mg Tabs (Diazepam) .[name]... Take 1 tablet by mouth three times a day as needed for nerves   Complete Medication List: 1)  Tramadol Hcl 50 Mg Tabs (Tramadol hcl) .... As needed 2)  Methocarbamol 750 Mg Tabs (Methocarbamol) .... As needed 3)  Valium 5 Mg Tabs (Diazepam) ....  Take 1 tablet by mouth three times a day as needed for nerves   Patient Instructions: 1)  Today we updated your med list- see below.... 2)  We refilled your Valium per your request...  3)  We did follow up PFT & CXR... please call the "phone tree" in a few days for your results.Marland KitchenMarland Kitchen 4)  In addition we will arrange for a Exercise Stress Test at [facility] on [facility] [address]... we will call you w/ these results when available... 5)  Congrats on quitting smoking... and staying quit!!! 6)  Keep up the good work w/ your exercise program... 7)  Call for any problems...   Prescriptions: VALIUM 5  MG  TABS (DIAZEPAM) take 1 tablet by mouth three times a day as needed for nerves  #100 x prn   Entered and Authorized by:   Michele Mcalpine MD   Signed by:   Michele Mcalpine MD on 11/14/2007   Method used:   Print then Give to Patient   RxID:   [email or web address]  ]

## 2010-05-27 NOTE — Progress Notes (Signed)
Summary: sinus infection  Phone Note Call from Patient Call back at [phone]   Caller: Patient Call For: nadel Summary of Call: sinus infection  pharmacy [facility] [address] Initial call taken by: Rickard Patience,  April 11, 2008 10:33 AM  Follow-up for Phone Call        Pt c/o headache and sinus pain and pressure x1 week. Cough with yellow mucus production. Pt requesting a Zpak and maybe something to help with the cough. Please advise. Michel Bickers CMA  April 11, 2008 11:09 AM  per SN---ok for pt to have zpak and use mucinex  2 tabs by mouth two times a day with fluids and use nasal saline spray as needed  thanks. Marijo File CMA  April 11, 2008 11:29 AM   Additional Follow-up for Phone Call Additional follow up Details #1::        Pt aware of SN's recs. Michel Bickers CMA  April 11, 2008 11:39 AM    New/Updated Medications: ZITHROMAX Z-PAK 250 MG TABS (AZITHROMYCIN) Take as directed   Prescriptions: ZITHROMAX Z-PAK 250 MG TABS (AZITHROMYCIN) Take as directed  #1 x 0   Entered by:   Michel Bickers CMA   Authorized by:   Michele Mcalpine MD   Signed by:   Michel Bickers CMA on 04/11/2008   Method used:   Electronically to        [facility] [address] [id number] (retail)       [address] [place], [place]  [address]       Ph: [phone]       Fax: [phone]   RxID:   [id number]

## 2010-05-27 NOTE — Progress Notes (Signed)
Summary: talk to nurse  Phone Note Call from Patient Call back at [phone]   Caller: Patient Call For: Richard Li Summary of Call: Pt c/o pain in the middle of his rib cage x 2 weeks, describes it is a constant pain especially at night, pls advise.//[facility] Initial call taken by: Darletta Moll,  January 08, 2010 3:46 PM  Follow-up for Phone Call        pt had cxr done on 8-19...called and spoke with pt and he stated  that 2 wks ago--pain in right side in rib area--lots of pain that keeps getting worse.  unable to sleep at night---took a couple of days off and he started to feel some better, but now back at work and its worse.  some pain with breathing--no known injury.  please advise. thanks  NKDA Randell Loop CMA  January 08, 2010 4:16 PM   Additional Follow-up for Phone Call Additional follow up Details #1::        per SN----ok for pt to have nexium 40mg    1 by mouth  30 mins prior to dinner, vidodin #50  1 by mouth every 6 hours as needed for severe pain  no refills--rov scheduled for 10-3 at 3pm--explained to pt that if he is feeling better to call back and let us know if the meds helped. Randell Loop CMA  January 08, 2010 5:07 PM     New/Updated Medications: NEXIUM 40 MG CPDR (ESOMEPRAZOLE MAGNESIUM) take one capsule by mouth 30 mins prior to dinner VICODIN 5-500 MG TABS (HYDROCODONE-ACETAMINOPHEN) take one tablet by mouth every 6 hours as needed for severe pain Prescriptions: VICODIN 5-500 MG TABS (HYDROCODONE-ACETAMINOPHEN) take one tablet by mouth every 6 hours as needed for severe pain  #50 x 0   Entered by:   Randell Loop CMA   Authorized by:   Michele Mcalpine MD   Signed by:   Randell Loop CMA on 01/08/2010   Method used:   Telephoned to ...       [facility] ([facility])       [address]       [place], [place]  [address]       Ph: [phone]       Fax: [phone]   RxID:   [id number] NEXIUM 40 MG CPDR (ESOMEPRAZOLE  MAGNESIUM) take one capsule by mouth 30 mins prior to dinner  #30 x 2   Entered by:   Randell Loop CMA   Authorized by:   Michele Mcalpine MD   Signed by:   Randell Loop CMA on 01/08/2010   Method used:   Electronically to        [facility] (retail)       [address]       [place], [place]  [address]       Ph: [phone]       Fax: [phone]   RxID:   [id number]

## 2010-05-27 NOTE — Letter (Signed)
Summary: Out of Work  [employer]  [address]   [place], [place] [address]   Phone: [phone]  Fax: [phone]    April 09, 2010   Employee:  PUNEET MASONER    To Whom It May Concern:   For Medical reasons, please excuse the above named employee from work for the following dates:  Start:   April 09, 2010  End:   April 27, 2010  If you need additional information, please feel free to contact our office.         Sincerely,         Rubye Oaks, NP

## 2010-05-27 NOTE — Progress Notes (Signed)
Summary: PA for fentanyl  Phone Note Outgoing Call   Call placed by: Vernie Murders,  January 29, 2010 1:41 PM Call placed to: Insurer Summary of Call: Recieved PA form from [facility] for fentanyl patch.  Called [employer] to initiate PA and answered the clinical questions over the phone.  Will await determination letter. Initial call taken by: Vernie Murders,  January 29, 2010 1:45 PM  Follow-up for Phone Call        Fentanyl patch denied. Deial letter given to Leigh to have SN address. Carron Curie CMA  February 01, 2010 9:14 AM   Additional Follow-up for Phone Call Additional follow up Details #1::        called and spoke with [facility]-- fentanyl patch has been approved from 02-04-2010 x 6 months---will call the pharmacy to make them aware Randell Loop [facility]  February 04, 2010 4:16 PM

## 2010-05-27 NOTE — Assessment & Plan Note (Signed)
Summary: rov/ mbw   CC:  occasional pain in lower right back x2weeks.  pt states sometimes it takes his breath away and other times he doesn't notice it.[name]  History of Present Illness: 50 yo with known history of COPD.    December 16, 2008--Presents for an acute office visit. Complains of occasional pain in lower right back x2weeks.  pt states sometimes it takes his breath away and other times he doesn't notice it. 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 Denies chest pain, dyspnea, orthopnea, hemoptysis, fever, n/v/d, edema, headache, urinary symtpoms, bloody stools, ext weakness, radicular symtpms. Worse with bending, after prolonged sitting. Feels stiff.   Medications Prior to Update: 1)  Tramadol Hcl 50 Mg  Tabs (Tramadol Hcl) .... As Needed 2)  Methocarbamol 750 Mg  Tabs (Methocarbamol) .... As Needed 3)  Valium 5 Mg  Tabs (Diazepam) .... Take 1 Tablet By Mouth Three Times A Day As Needed For Nerves 4)  Spiriva Handihaler 18 Mcg  Caps (Tiotropium Bromide Monohydrate) .... Inhale One Capsule By Mouth Every Morning 5)  Zithromax Z-Pak 250 Mg Tabs (Azithromycin) .... Take As Directed  Current Medications (verified): 1)  Tramadol Hcl 50 Mg  Tabs (Tramadol Hcl) .... As Needed 2)  Methocarbamol 750 Mg  Tabs (Methocarbamol) .... As Needed 3)  Valium 5 Mg  Tabs (Diazepam) .... Take 1 Tablet By Mouth Three Times A Day As Needed For Nerves 4)  Spiriva Handihaler 18 Mcg  Caps (Tiotropium Bromide Monohydrate) .... Inhale One Capsule By Mouth Every Morning  Allergies (verified): No Known Drug Allergies  Past History:  Past Surgical History: Last updated: 11/14/2007 S/P appendectomy S/P ENT surgery (turbinate reduction) by [name] in 2002   Family History: Last updated: 11/14/2007 1 Brother had an MI at age 14...  Social History: Last updated: 11/14/2007 quit smoking in 2007  Risk Factors: Smoking Status: quit (02/26/2007)  Past Medical History: CHEST PAIN, ATYPICAL  (ICD-786.59) - no prev hx of cardiac problems... risk factors include: +FamHx, Smoking Hx, low HDL, male gender... we will screen w/ a Stress Myoview... **he saw [name] 5/08- EKG w/ NSR, IRBBB, NSSTTWA... Myoview was planned but never done**  ALLERGIC RHINITIS (ICD-477.9) - uses OTC meds as needed... prev ENT surgery from [name] in 2002 w/ turbinate reduction...  COPD (ICD-496) - former heavy smoker and quit in 2007... chronic bronchitic symptoms... prev on Symbicort but he stopped this on his own...  ABNORMAL CHEST XRAY (ICD-793.1) - prev CXR's w/ hyperinflation c/w COPD, 1cm RLL density without change- likely scarring...  ~  CTChest 11/07 showed hyperinflation w/ emphysematous changes in the upper lobes, 6mm RML nodule without change, several other scattered sm nodules, pleural thickening, and ca++ in Abd Ao...   ~  CRX today shows hyperinflation, some scarring, NAD...  ~  PFT's today show FVC= 6.61 (135%), FEV1=3.88 (97%), %1sec=59, mid-flows= 44%...  BACK PAIN, LUMBAR (ICD-724.2) - prev eval from [name]...  ANXIETY (ICD-300.00) - on VALIUM 5mg  as needed... he states Xanax is too strong...  Health Maintenance - FlexSig 1993 showed rectal fissure otherw neg...     Review of Systems      See HPI  Vital Signs:  Patient profile:   50 year old male Height:      70 inches Weight:      219 pounds BMI:     31.54 O2 Sat:      95 % on Room air Temp:     97.0 degrees F oral Pulse rate:   92 /  minute BP sitting:   144 / 86  (right arm) Cuff size:   regular  Vitals Entered By: Boone Master CNA (December 16, 2008 4:42 PM)  O2 Flow:  Room air CC: occasional pain in lower right back x2weeks.  pt states sometimes it takes his breath away and other times he doesn't notice it. Is Patient Diabetic? No Comments Medications reviewed with patient Daytime contact number verified with patient.  Boone Master CNA  December 16, 2008 4:42 PM    Physical Exam  Additional Exam:  WD, WN, 50  y/o WM in NAD... GENERAL:  Alert & oriented; pleasant & cooperative... HEENT:  [place]/AT, EOM-wnl, PERRLA,  EACs-clear, TMs-wnl, NOSE-clear, THROAT-clear & wnl; +Bilat parotid hypertrophy... NECK:  Supple w/ full ROM; no JVD; normal carotid impulses w/o bruits; no thyromegaly or nodules palpated; no lymphadenopathy. CHEST:  Decr BS bilat but clear to P & A; without wheezes/ rales/ or rhonchi heard...  HEART:  Regular Rhythm; without murmurs/ rubs/ or gallops detected... ABDOMEN:  Soft & nontender; normal bowel sounds; no organomegaly or masses palpated... EXT: without deformities, mild arthritic changes; no varicose veins/ venous insuffic/ or edema. tender along lumbar sacral area R>L , neg SLR, no redness or rash.  NEURO:  CN's intact; motor testing normal; sensory testing normal; gait normal & balance OK. DERM:  No lesions noted; no rash etc...     Impression & Recommendations:  Problem # 1:  BACK PAIN, LUMBAR (ICD-724.2)  Flare w/ low back strain advised on lifting techniques, stretching, exercise, wt loss.  REC:  Motrin 800mg  three times a day for 7 days Skelaxin 800mg  three times a day as needed muscle spasm.  Warm heat pad three times a day to back as needed  Stretch as tolerated, advancing activity.  Vicodin 1 by mouth every 4-6 hr as needed for pain, will make you sleepy, CAN NOT DRIVE WITH THIS MED Please contact office for sooner follow up if symptoms do not improve or worsen   Orders: Est. Patient Level III (04540)  Medications Added to Medication List This Visit: 1)  Methocarbamol 750 Mg Tabs (Methocarbamol) .... As needed 2)  Ibuprofen 800 Mg Tabs (Ibuprofen) .[name].. 1 by mouth three times a day w/ food, for 7 days 3)  Skelaxin 800 Mg Tabs (Metaxalone) .[name].. 1 by mouth three times a day as needed muscle spasm 4)  Vicodin 5-500 Mg Tabs (Hydrocodone-acetaminophen) .[name].. 1 by mouth every 4-6 hr as needed pain, will cause sleepiness  Complete Medication List: 1)  Valium 5 Mg  Tabs (Diazepam) .... Take 1 tablet by mouth three times a day as needed for nerves 2)  Ibuprofen 800 Mg Tabs (Ibuprofen) .[name].. 1 by mouth three times a day w/ food, for 7 days 3)  Skelaxin 800 Mg Tabs (Metaxalone) .[name].. 1 by mouth three times a day as needed muscle spasm 4)  Vicodin 5-500 Mg Tabs (Hydrocodone-acetaminophen) .[name].. 1 by mouth every 4-6 hr as needed pain, will cause sleepiness  Patient Instructions: 1)  Motrin 800mg  three times a day for 7 days 2)  Skelaxin 800mg  three times a day as needed muscle spasm.  3)  Warm heat pad three times a day to back as needed  4)  Stretch as tolerated, advancing activity.  5)  Vicodin 1 by mouth every 4-6 hr as needed for pain, will make you sleepy, CAN NOT DRIVE WITH THIS MED 6)  Please contact office for sooner follow up if symptoms do not improve or worsen  Prescriptions:  VICODIN 5-500 MG TABS (HYDROCODONE-ACETAMINOPHEN) 1 by mouth every 4-6 hr as needed pain, will cause sleepiness  #20 x 0   Entered and Authorized by:   Rubye Oaks NP   Signed by:   Everlean Bucher NP on 12/16/2008   Method used:   Print then Give to Patient   RxID:   [id number] SKELAXIN 800 MG TABS (METAXALONE) 1 by mouth three times a day as needed muscle spasm  #30 x 0   Entered and Authorized by:   Rubye Oaks NP   Signed by:   Rubye Oaks NP on 12/16/2008   Method used:   Electronically to        [facility] ([facility]ail)       [address]       [place], [place]  [address]       Ph: [phone]       Fax: [phone]   RxID:   [id number] IBUPROFEN 800 MG TABS (IBUPROFEN) 1 by mouth three times a day w/ food, for 7 days  #21 x 0   Entered and Authorized by:   Rubye Oaks NP   Signed by:   Rubye Oaks NP on 12/16/2008   Method used:   Electronically to        [facility] (retail)       [address]       [place], [place]  [address]       Ph: [phone]       Fax: [phone]   RxID:    [id number]

## 2010-06-10 NOTE — Letter (Signed)
Summary: [facility]  [facility]   Imported By: Lennie Odor 05/31/2010 10:33:51  _____________________________________________________________________  External Attachment:    Type:   Image     Comment:   External Document

## 2010-09-06 ENCOUNTER — Other Ambulatory Visit: Payer: Self-pay | Admitting: Pulmonary Disease

## 2010-09-06 MED ORDER — DIAZEPAM 5 MG PO TABS: 5.0000 mg | ORAL_TABLET | Freq: Three times a day (TID) | ORAL | Status: DC | PRN

## 2010-09-06 NOTE — Telephone Encounter (Signed)
lmomtcb x 1. Rx will be sent after pt makes/schedules OV.

## 2010-09-06 NOTE — Telephone Encounter (Signed)
Pls advise on lorazepam refill.

## 2010-09-06 NOTE — Telephone Encounter (Signed)
Per SN---ok to refill the lorazepam x 1 month.  Pt must schedule appt for refills of this med with SN.  thanks

## 2010-09-06 NOTE — Telephone Encounter (Signed)
Pt has scheduled follow-up for Wed., 6/20 @ 3:30 pm with SN and aware of refill x 1 to pharmacy.  Pt must keep OV for further rfills.  Per Leigh, pt is on Diazepam, not Lorazepam but okay to fill x1 month supply.

## 2010-09-10 NOTE — Assessment & Plan Note (Signed)
[facility]                            CARDIOLOGY OFFICE NOTE   NAME:Richard Li, Richard Li                      MRN:          [id number]  DATE:08/25/2006                            DOB:          Nov 13, 1960    Mr. Knighton is a very pleasant 50 year old male with a past medical  history of COPD, whom I am asked to evaluate for dyspnea and chest pain.  He has no prior cardiac history.  He does have a history of dyspnea on  exertion, but has attributed that to his COPD, which Dr. Kriste Basque follows.  He also recently lifted a TV.  Two days later, he developed pain in the  left chest area.  The pain was not pleuritic or positional, nor was it  related to food.  It was not exertional.  It lasted for approximately  two weeks, but it has resolved.  He has a brother who had a myocardial  infarction at age 5 and he is very concerned about these symptoms and  we were asked to further evaluate.  Note, there was no radiation to the  pain and there was no associated nausea or vomiting, shortness of  breath, or diaphoresis.   He is on no routine medications at this point, but he does take  methocarbamol p.r.n., Valium p.r.n. and Tramadol p.r.n.   There are no known drug allergies.   SOCIAL HISTORY:  He has a long history of tobacco use, smoking one to  two packs per day from 16 to 44.  However, he discontinued this one year  ago.  He consumed a 12-pack of beer per week.   PAST MEDICAL HISTORY:  There is no diabetes mellitus, hypertension, but  apparently there is a history of mildly decreased HDL.  He does have  COPD.  He has had a prior appendectomy, tonsillectomy and sinus surgery.  There are no other surgeries noted.   REVIEW OF SYSTEMS:  He denies any headaches.  He does have a  nonproductive cough.  There is no hemoptysis.  There is no dysphagia,  odynophagia, melena or hematochezia.  There is no dysuria or hematuria.  There is no rash or seizure activity.  There  is no orthopnea, PND, or  pedal edema.  There is no claudication.  The remaining systems are  negative.   PHYSICAL EXAM:  His physical exam today shows a blood pressure of 126/78  and his pulse is 67.  He weighs 207 pounds.  He is well-developed and  well-nourished, in no acute distress.  His skin is warm and dry.  He  does not appear to be depressed and there is no peripheral clubbing.  His back is normal.  His HEENT is normal with normal eyelids.  His neck  is supple with a normal upstroke bilaterally and I cannot appreciate  bruits.  There is no jugular venous distention and no thyromegaly is  noted.  His chest is clear to auscultation with normal expansion.  His  cardiovascular exam reveals a regular rate and rhythm, normal S1 and S2.  There are no murmurs, rubs or  gallops noted.  His PMI is nondisplaced.  Abdominal exam:  Nontender, nondistended.  Positive bowel sounds.  No  hepatosplenomegaly, no mass appreciated.  There is no abdominal bruit.  He has 2+ femoral pulses bilaterally, no bruits.  Extremities show no  edema and I can palpate no cords.  He has 2+ dorsalis pedis pulses  bilaterally.  Neurologic exam is grossly intact.   Electrocardiogram today shows a sinus rhythm at a rate of 65.  There is  an incomplete right bundle branch block, and there are nonspecific  inferior T-wave changes.   DIAGNOSES:  1. Atypical chest pain - the patient's symptoms are somewhat atypical.      However, he has a strong family history and also has a remote      history of tobacco use.  He also apparently has a low HDL.  We will      plan to risk stratify with a stress Myoview.  If it shows normal      perfusion, then we will not pursue further cardiac workup.  He will      continue with risk factor modification.  2. Dyspnea - this is most likely related to his COPD.  However, we      will assess his LV function with the Myoview, as well.  3. Tobacco abuse - he has discontinued this  approximately one year      ago.  4. History of low HDL - I will leave this to Dr. Kriste Basque.  He may      benefit from Niaspan in the future.  5. Risk factor modification - we discussed the importance of      continuing to avoid tobacco, diet and exercise.  We will see him      back on an as-needed basis, pending the results of his Myoview.     Madolyn Frieze Jens Som, MD, [facility]  Electronically Signed    BSC/MedQ  DD: 08/25/2006  DT: 08/25/2006  Job #: [id number]   cc:   Lonzo Cloud. Kriste Basque, MD

## 2010-10-13 ENCOUNTER — Ambulatory Visit: Payer: Self-pay | Admitting: Pulmonary Disease

## 2010-10-28 ENCOUNTER — Ambulatory Visit: Payer: Self-pay | Admitting: Pulmonary Disease

## 2010-12-06 ENCOUNTER — Encounter: Payer: Self-pay | Admitting: Pulmonary Disease

## 2010-12-06 ENCOUNTER — Ambulatory Visit (INDEPENDENT_AMBULATORY_CARE_PROVIDER_SITE_OTHER): Payer: Managed Care, Other (non HMO) | Admitting: Pulmonary Disease

## 2010-12-06 DIAGNOSIS — F411 Generalized anxiety disorder: Secondary | ICD-10-CM

## 2010-12-06 DIAGNOSIS — J4489 Other specified chronic obstructive pulmonary disease: Secondary | ICD-10-CM

## 2010-12-06 DIAGNOSIS — M545 Low back pain, unspecified: Secondary | ICD-10-CM

## 2010-12-06 DIAGNOSIS — J019 Acute sinusitis, unspecified: Secondary | ICD-10-CM

## 2010-12-06 DIAGNOSIS — E785 Hyperlipidemia, unspecified: Secondary | ICD-10-CM

## 2010-12-06 DIAGNOSIS — J309 Allergic rhinitis, unspecified: Secondary | ICD-10-CM

## 2010-12-06 DIAGNOSIS — J449 Chronic obstructive pulmonary disease, unspecified: Secondary | ICD-10-CM

## 2010-12-06 MED ORDER — PREDNISONE 20 MG PO TABS: ORAL_TABLET | ORAL | Status: DC

## 2010-12-06 MED ORDER — METHYLPREDNISOLONE ACETATE 80 MG/ML IJ SUSP
80.0000 mg | Freq: Once | INTRAMUSCULAR | Status: AC
  Administered 2010-12-06: 80 mg via INTRA_ARTICULAR

## 2010-12-06 MED ORDER — DIAZEPAM 5 MG PO TABS: 5.0000 mg | ORAL_TABLET | Freq: Three times a day (TID) | ORAL | Status: DC | PRN

## 2010-12-06 NOTE — Patient Instructions (Signed)
Today we updated your med list in EPIC...    We refilled your Valium per request...  For your Sinusitis:    Looks like the Levaquin got rid of the infectious component...    We gave you a Depo shot & tapering course of Prednisone for the inflammation component...  You may also find some benefit from an ANTIHISTAMINE in the AM (Claritin, Zyrtek, Allegra)>    Try using a nasal SALINE mist every 1-2 H during the day...    And try the [facility] 1-2 tabs twice daily w/ lots of fluids...  Call for any questions.Marland KitchenMarland Kitchen

## 2010-12-06 NOTE — Progress Notes (Signed)
Subjective:    Patient ID: Richard Li, male    DOB: Dec 26, 1960, 50 y.o.   MRN: [id number]  HPI 50 y/o WM here for a follow up visit & CPX...  ~  [name]:  he was a very heavy smoker and quit in 2007... he has moderate COPD but remarkably preserved PFT's in the past... he was on Symbicort but stopped this on his own...he complains of increased SOB in the heat, and after eating...  notes min cough,  scant sputum, no hemoptysis, no wheezing/ snoring/ daytime hypersomnolence, etc... he notes vague chest discomfort on & off- not always activity related... we discussed pursuing Pulm & Cardiac testing for completeness...  he takes Valium Qhs but wonders about increasing this med...  ~  December 11, 2009:  81yr ROV & doing satis but still notes some SOB w/ activity in hot weather... he's tried Advair & Spiriva but he states that he didn't like them & the TV advertisment scared him... he notes mild cough, min sputum (clear), no CP/ palpit, etc... unfortunately he has gained weight from 203 to 230# at present... we discussed diet + exercise & weight reduction + sample of DULERA 100- 2sp Bid...  ~  January 26, 2010:  presents w/ 75mo hx left chest wall pain- laterally in posterolat ribs, worse at night, not pleuritic, +sl tender, no known trauma... he states Vicodin w/o help... notes assoc cough & sm amt discolored sputum- advised use Dulera Bid + Mucinex Bid w/ fluids... CXR repeat> COPD, NAD & rib details neg; therefore discussed plan to Rx w/ rest/ heat/ binder/ PERCOET & Augmentin... he wants to incr VALIUM to 5mg  Tid- OK... if discomfort persists then consider bone scan/ neuro consult/ pain management ==> f/u note: symptoms finally resolved after one month off work & time to heal rib fxs...  ~  December 06, 2010:  36mo ROV & he reports a visit to [facility] last wk for sinus infection given Levaquin & infection symptoms improved but he has persistent congestion, sinus drainage, headache, & cough> we discussed the need  for anti-inflamm therapy w/ Depo120, Pred 3dtaper, Mucinex, Fluids, etc...  He notes otherw stable but has gained 40# in the 32yrs since he's quit smoking;  Breathing stable, denies recurrent CP, chronic stable DOE;  Still trying to control Lipids w/ diet alone but 40# wt gain wil,l shoot that theory all to pieces & he is asked to ret for FLP so we can determine if he needs meds...   Current Problem List:  CHEST PAIN, ATYPICAL (ICD-786.59) - no prev hx of cardiac problems... risk factors include: +FamHx, Smoking Hx, low HDL, male gender... we will screen w/ a Stress Myoview... he saw [name] 5/08- EKG w/ NSR, IRBBB, NSSTTWA... Myoview was done 8/09 & showed good exerc capacity, normal BP response, no signif EKG changes, normal images w/o ischemia/ scar & EF= 58%... ~  EKG 8/11 shows NSR, WNL...  ALLERGIC RHINITIS (ICD-477.9) - uses OTC meds as needed... prev ENT surgery from [name] in 2002 w/ turbinate reduction... he has prominent parotid hypertrophy bilat.  COPD (ICD-496) - former heavy smoker and quit in 2007... chronic bronchitic symptoms... prev on Symbicort but he stopped this on his own & refuses Spiriva due to side effects discussed on TV ads...  ~  8/11:  started on DULERA 100-5 2spBid...  ABNORMAL CHEST XRAY (ICD-793.1) - prev CXR's w/ hyperinflation c/w COPD, 1cm RLL density without change- likely scarring... ~  CTChest 11/07 showed hyperinflation w/ emphysematous changes in  the upper lobes, 6mm RML nodule without change, several other scattered sm nodules, pleural thickening, and ca++ in Abd Ao...  ~  baseline CXR shows hyperinflation, some scarring, NAD... ~  PFT's 7/09 showed FVC= 6.61 (135%), FEV1=3.88 (97%), %1sec=59, mid-flows= 44%. ~  CXR 8/11 showed COPD w/ hyperaeration, min scarring, NAD> confirmed 10/11 & Left Rib details= neg.  HYPERLIPIDEMIA (ICD-272.4) - on diet therapy alone w/ incr TG assoc w/ recent weight gain... discussed diet + exercise. ~  FLP 8/09 (wt=203#)  showed TChol 139, TG 116, HDL 35, LDL 81 ~  FLP 8/11 (wt=230#) showed TChol 145, TG 247, HDL 33, LDL 68... advised diet + exercise, get wt down.  GERD (ICD-530.81) - on NEXIUM 40mg  Prn for reflux symptoms...  GU> He had Vasectomy from [name] in Jan2012...  BACK PAIN, LUMBAR (ICD-724.2) - prev eval from [name]...  ANXIETY (ICD-300.00) - on VALIUM 5mg  as needed... he states Xanax is too strong...  Health Maintenance - FlexSig 1993 showed rectal fissure otherw neg...   Current Medications, Allergies, Past Medical History, Past Surgical History, Family History, and Social History were reviewed in [employer] record.     Past Surgical History  Procedure Date  . Appendectomy   . Nasal turbinate reduction 2002    Dr.Crossley    Outpatient Encounter Prescriptions as of 12/06/2010  Medication Sig Dispense Refill  . dextromethorphan-guaiFENesin (MUCINEX DM) 30-600 MG per 12 hr tablet Take 1 tablet by mouth every 12 (twelve) hours.        . diazepam (VALIUM) 5 MG tablet Take 1 tablet (5 mg total) by mouth every 8 (eight) hours as needed for anxiety or sleep.  90 tablet  0  . esomeprazole (NEXIUM) 40 MG capsule Take 40 mg by mouth daily. Take one capsule by mouth 30 mins prior to dinner.       . mometasone-formoterol (DULERA) 100-5 MCG/ACT AERO Inhale 2 puffs into the lungs 2 (two) times daily.        [name] oxyCODONE-acetaminophen (PERCOCET) 7.5-500 MG per tablet Take 1 tablet by mouth every 4 (four) hours as needed. Take 1/2 to 1 by mouth every 6-8 hours as needed.         No Known Allergies   Current Medications, Allergies, Past Medical History, Past Surgical History, Family History, and Social History were reviewed in [employer] record.    Review of Systems        See HPI - all other systems neg except as noted...       The patient complains of chest pain and dyspnea on exertion.  The patient denies anorexia, fever, weight loss, weight  gain, vision loss, decreased hearing, hoarseness, syncope, peripheral edema, prolonged cough, headaches, hemoptysis, abdominal pain, melena, hematochezia, severe indigestion/heartburn, hematuria, incontinence, muscle weakness, suspicious skin lesions, transient blindness, difficulty walking, depression, unusual weight change, abnormal bleeding, enlarged lymph nodes, and angioedema.     Objective:   Physical Exam      WD, WN, 50 y/o WM in NAD... GENERAL:  Alert & oriented; pleasant & cooperative... HEENT:  [place]/AT, EOM-wnl, PERRLA,  EACs-clear, TMs-wnl, NOSE-purulent drainage, turbinates boggy, THROAT-clear & wnl;  NECK:  Supple w/ fairROM; no JVD; normal carotid impulses w/o bruits; no thyromegaly or nodules palpated; no lymphadenopathy. CHEST:  Decr BS bilat but clear to P & A; without wheezes/ rales/ or rhonchi heard...  sl tender left lat/ post chest wall over ribs... HEART:  Regular Rhythm; without murmurs/ rubs/ or gallops detected... ABDOMEN:  Soft & nontender; normal bowel sounds; no organomegaly or masses palpated... EXT: without deformities, mild arthritic changes; no varicose veins/ venous insuffic/ or edema. NEURO:  CN's intact; motor testing normal; sensory testing normal; gait normal & balance OK. DERM:  No lesions noted; no rash, several tatoos...   Assessment & Plan:   SINUSITIS>  He has finished the Levaquin & we discussed the need for Depo120, Prednisone 20mg - 3dtapering sched #12 over the next 2 weeks per protocol;  In addition he should maintain Rx w/ MUCINEX, Fluids, etc...  COPD, ex-smoker, Abn CXR>  Continue RX w/ Dulera 100- 2spBid, Mucinex regularly & fluids as discussed...  HYPERLIPIDEMIA>  He has endeavored to control this w/ diet alone, unfortunately he has gained wt since stopping the smoking;  Needs f/u FLP & consideration of med rx...  GERD>  He continues on NEXIUM as needed...  LBP>  Stable w/ prev eval from [name]...  Anxiety>  He uses Valium 5mg   prn.Marland KitchenMarland Kitchen

## 2010-12-18 ENCOUNTER — Encounter: Payer: Self-pay | Admitting: Pulmonary Disease

## 2011-04-29 ENCOUNTER — Ambulatory Visit (INDEPENDENT_AMBULATORY_CARE_PROVIDER_SITE_OTHER): Payer: Managed Care, Other (non HMO)

## 2011-04-29 DIAGNOSIS — J069 Acute upper respiratory infection, unspecified: Secondary | ICD-10-CM

## 2011-07-11 ENCOUNTER — Telehealth: Payer: Self-pay | Admitting: Pulmonary Disease

## 2011-07-11 ENCOUNTER — Other Ambulatory Visit: Payer: Self-pay | Admitting: Pulmonary Disease

## 2011-07-11 NOTE — Telephone Encounter (Signed)
Error.  Pt decided to wait.  Richard Li

## 2011-08-22 ENCOUNTER — Ambulatory Visit: Payer: Managed Care, Other (non HMO) | Admitting: Pulmonary Disease

## 2011-08-31 ENCOUNTER — Telehealth: Payer: Self-pay | Admitting: Pulmonary Disease

## 2011-08-31 NOTE — Telephone Encounter (Signed)
PT informed that last CPX with Dr Kriste Basque was on 12-11-09.

## 2011-09-15 ENCOUNTER — Ambulatory Visit (INDEPENDENT_AMBULATORY_CARE_PROVIDER_SITE_OTHER): Payer: Managed Care, Other (non HMO) | Admitting: Internal Medicine

## 2011-09-15 VITALS — BP 132/76 | HR 89 | Temp 98.4°F | Resp 16 | Ht 69.0 in | Wt 232.0 lb

## 2011-09-15 DIAGNOSIS — W57XXXA Bitten or stung by nonvenomous insect and other nonvenomous arthropods, initial encounter: Secondary | ICD-10-CM

## 2011-09-15 DIAGNOSIS — S30860A Insect bite (nonvenomous) of lower back and pelvis, initial encounter: Secondary | ICD-10-CM

## 2011-09-15 DIAGNOSIS — S20469A Insect bite (nonvenomous) of unspecified back wall of thorax, initial encounter: Secondary | ICD-10-CM

## 2011-09-16 NOTE — Progress Notes (Signed)
  Subjective:    Patient ID: Richard Li, male    DOB: 05/13/1960, 51 y.o.   MRN: [id number]  HPIHe drives a truck Nurse, children's. 4 days ago something was crawling on his neck that he slide away and he felt a prickly sensation. He has developed lesions on that which are mildly irritated/ had no itching    Review of Systems     Objective:   Physical Exam Vital signs stable There are 4 distinct lesions on the posterior aspect and neck that are 0.5 cm circles with hyperpigmentation/no vesicles/no secondary infection        Assessment & Plan:  Problem #1 insect bite He was reassured no treatment needed

## 2011-10-14 ENCOUNTER — Other Ambulatory Visit: Payer: Self-pay | Admitting: Pulmonary Disease

## 2011-10-14 ENCOUNTER — Telehealth: Payer: Self-pay | Admitting: Pulmonary Disease

## 2011-10-14 DIAGNOSIS — Z0000 Encounter for general adult medical examination without abnormal findings: Secondary | ICD-10-CM

## 2011-10-14 NOTE — Telephone Encounter (Signed)
Orders for labs have been placed in the computer for the pt.  thanks

## 2011-10-14 NOTE — Telephone Encounter (Signed)
ATC pt na phone keep ringing multiple times w/o a response. WCB

## 2011-10-14 NOTE — Telephone Encounter (Signed)
Please advise what labs pt needs to have done? thanks 

## 2011-10-17 NOTE — Telephone Encounter (Signed)
ATC pt at # provided above - NA and no option to leave VM - wcb

## 2011-10-18 ENCOUNTER — Encounter: Payer: Managed Care, Other (non HMO) | Admitting: Pulmonary Disease

## 2011-10-18 NOTE — Telephone Encounter (Signed)
Pt aware and needed nothing further 

## 2011-11-18 ENCOUNTER — Encounter: Payer: Managed Care, Other (non HMO) | Admitting: Pulmonary Disease

## 2011-12-12 ENCOUNTER — Ambulatory Visit (INDEPENDENT_AMBULATORY_CARE_PROVIDER_SITE_OTHER)
Admission: RE | Admit: 2011-12-12 | Discharge: 2011-12-12 | Disposition: A | Discharge status: Home / Self Care | Payer: Managed Care, Other (non HMO) | Source: Ambulatory Visit | Attending: Pulmonary Disease | Admitting: Pulmonary Disease

## 2011-12-12 ENCOUNTER — Ambulatory Visit (INDEPENDENT_AMBULATORY_CARE_PROVIDER_SITE_OTHER): Payer: Managed Care, Other (non HMO) | Admitting: Pulmonary Disease

## 2011-12-12 ENCOUNTER — Other Ambulatory Visit (INDEPENDENT_AMBULATORY_CARE_PROVIDER_SITE_OTHER): Payer: Managed Care, Other (non HMO)

## 2011-12-12 ENCOUNTER — Encounter: Payer: Self-pay | Admitting: Pulmonary Disease

## 2011-12-12 VITALS — BP 152/84 | HR 71 | Temp 97.9°F | Ht 70.0 in | Wt 237.0 lb

## 2011-12-12 DIAGNOSIS — F411 Generalized anxiety disorder: Secondary | ICD-10-CM

## 2011-12-12 DIAGNOSIS — Z0000 Encounter for general adult medical examination without abnormal findings: Secondary | ICD-10-CM

## 2011-12-12 DIAGNOSIS — R930 Abnormal findings on diagnostic imaging of skull and head, not elsewhere classified: Secondary | ICD-10-CM

## 2011-12-12 DIAGNOSIS — K219 Gastro-esophageal reflux disease without esophagitis: Secondary | ICD-10-CM

## 2011-12-12 DIAGNOSIS — J309 Allergic rhinitis, unspecified: Secondary | ICD-10-CM

## 2011-12-12 DIAGNOSIS — J4489 Other specified chronic obstructive pulmonary disease: Secondary | ICD-10-CM

## 2011-12-12 DIAGNOSIS — J449 Chronic obstructive pulmonary disease, unspecified: Secondary | ICD-10-CM

## 2011-12-12 DIAGNOSIS — M545 Low back pain, unspecified: Secondary | ICD-10-CM

## 2011-12-12 DIAGNOSIS — E785 Hyperlipidemia, unspecified: Secondary | ICD-10-CM

## 2011-12-12 DIAGNOSIS — Z23 Encounter for immunization: Secondary | ICD-10-CM

## 2011-12-12 LAB — CBC WITH DIFFERENTIAL/PLATELET
Basophils Absolute: 0.1 10*3/uL (ref 0.0–0.1)
Basophils Relative: 1.5 % (ref 0.0–3.0)
Eosinophils Absolute: 0.1 10*3/uL (ref 0.0–0.7)
Eosinophils Relative: 1.2 % (ref 0.0–5.0)
HCT: 47.3 % (ref 39.0–52.0)
Hemoglobin: 16.2 g/dL (ref 13.0–17.0)
Lymphocytes Relative: 32 % (ref 12.0–46.0)
Lymphs Abs: 2.2 10*3/uL (ref 0.7–4.0)
MCHC: 34.2 g/dL (ref 30.0–36.0)
MCV: 92 fl (ref 78.0–100.0)
Monocytes Absolute: 0.6 10*3/uL (ref 0.1–1.0)
Monocytes Relative: 8.8 % (ref 3.0–12.0)
Neutro Abs: 3.8 10*3/uL (ref 1.4–7.7)
Neutrophils Relative %: 56.5 % (ref 43.0–77.0)
Platelets: 231 10*3/uL (ref 150.0–400.0)
RBC: 5.14 Mil/uL (ref 4.22–5.81)
RDW: 13.6 % (ref 11.5–14.6)
WBC: 6.8 10*3/uL (ref 4.5–10.5)

## 2011-12-12 LAB — URINALYSIS
Bilirubin Urine: NEGATIVE
Hgb urine dipstick: NEGATIVE
Ketones, ur: NEGATIVE
Leukocytes, UA: NEGATIVE
Nitrite: NEGATIVE
Specific Gravity, Urine: 1.015 (ref 1.000–1.030)
Total Protein, Urine: NEGATIVE
Urine Glucose: NEGATIVE
Urobilinogen, UA: 0.2 (ref 0.0–1.0)
pH: 7 (ref 5.0–8.0)

## 2011-12-12 LAB — BASIC METABOLIC PANEL
BUN: 11 mg/dL (ref 6–23)
CO2: 29 mEq/L (ref 19–32)
Calcium: 9.1 mg/dL (ref 8.4–10.5)
Chloride: 104 mEq/L (ref 96–112)
Creatinine, Ser: 0.8 mg/dL (ref 0.4–1.5)
GFR: 102.39 mL/min (ref >60.00)
Glucose, Bld: 85 mg/dL (ref 70–99)
Potassium: 4.6 mEq/L (ref 3.5–5.1)
Sodium: 139 mEq/L (ref 135–145)

## 2011-12-12 LAB — HEPATIC FUNCTION PANEL
ALT: 36 U/L (ref 0–53)
AST: 28 U/L (ref 0–37)
Albumin: 4 g/dL (ref 3.5–5.2)
Alkaline Phosphatase: 120 U/L — ABNORMAL HIGH (ref 39–117)
Bilirubin, Direct: 0.2 mg/dL (ref 0.0–0.3)
Total Bilirubin: 0.9 mg/dL (ref 0.3–1.2)
Total Protein: 6.9 g/dL (ref 6.0–8.3)

## 2011-12-12 LAB — PSA: PSA: 0.45 ng/mL (ref 0.10–4.00)

## 2011-12-12 LAB — LDL CHOLESTEROL, DIRECT: Direct LDL: 57.7 mg/dL

## 2011-12-12 LAB — LIPID PANEL
Cholesterol: 139 mg/dL (ref 0–200)
HDL: 34.5 mg/dL — ABNORMAL LOW (ref >39.00)
Total CHOL/HDL Ratio: 4
Triglycerides: 259 mg/dL — ABNORMAL HIGH (ref 0.0–149.0)
VLDL: 51.8 mg/dL — ABNORMAL HIGH (ref 0.0–40.0)

## 2011-12-12 LAB — TSH: TSH: 2.11 u[IU]/mL (ref 0.35–5.50)

## 2011-12-12 IMAGING — CR DG CHEST 2V
2 series · 2 of 2 positions shown · non-contrast
Comparison: [DATE]

CLINICAL DATA: Physical exam, no complains

CHEST - 2 VIEW

[view not recorded (1 of 2)]
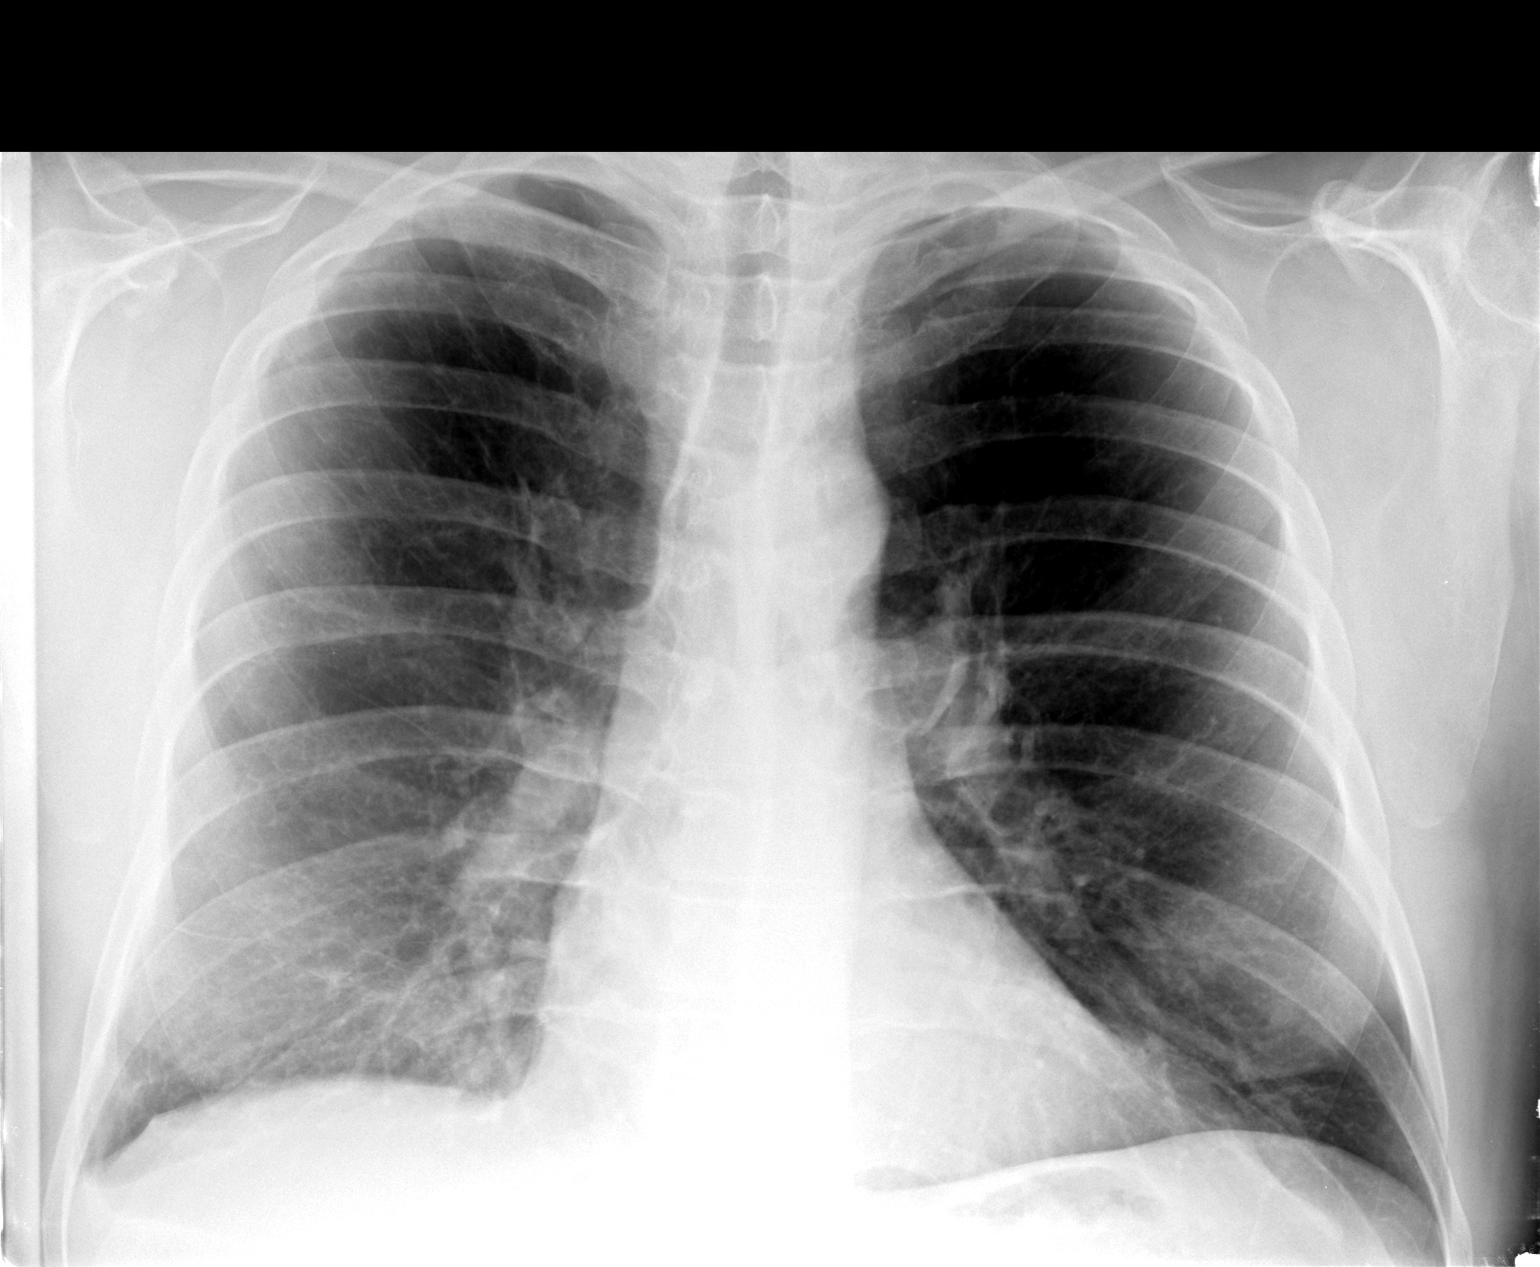

[view not recorded (2 of 2)]
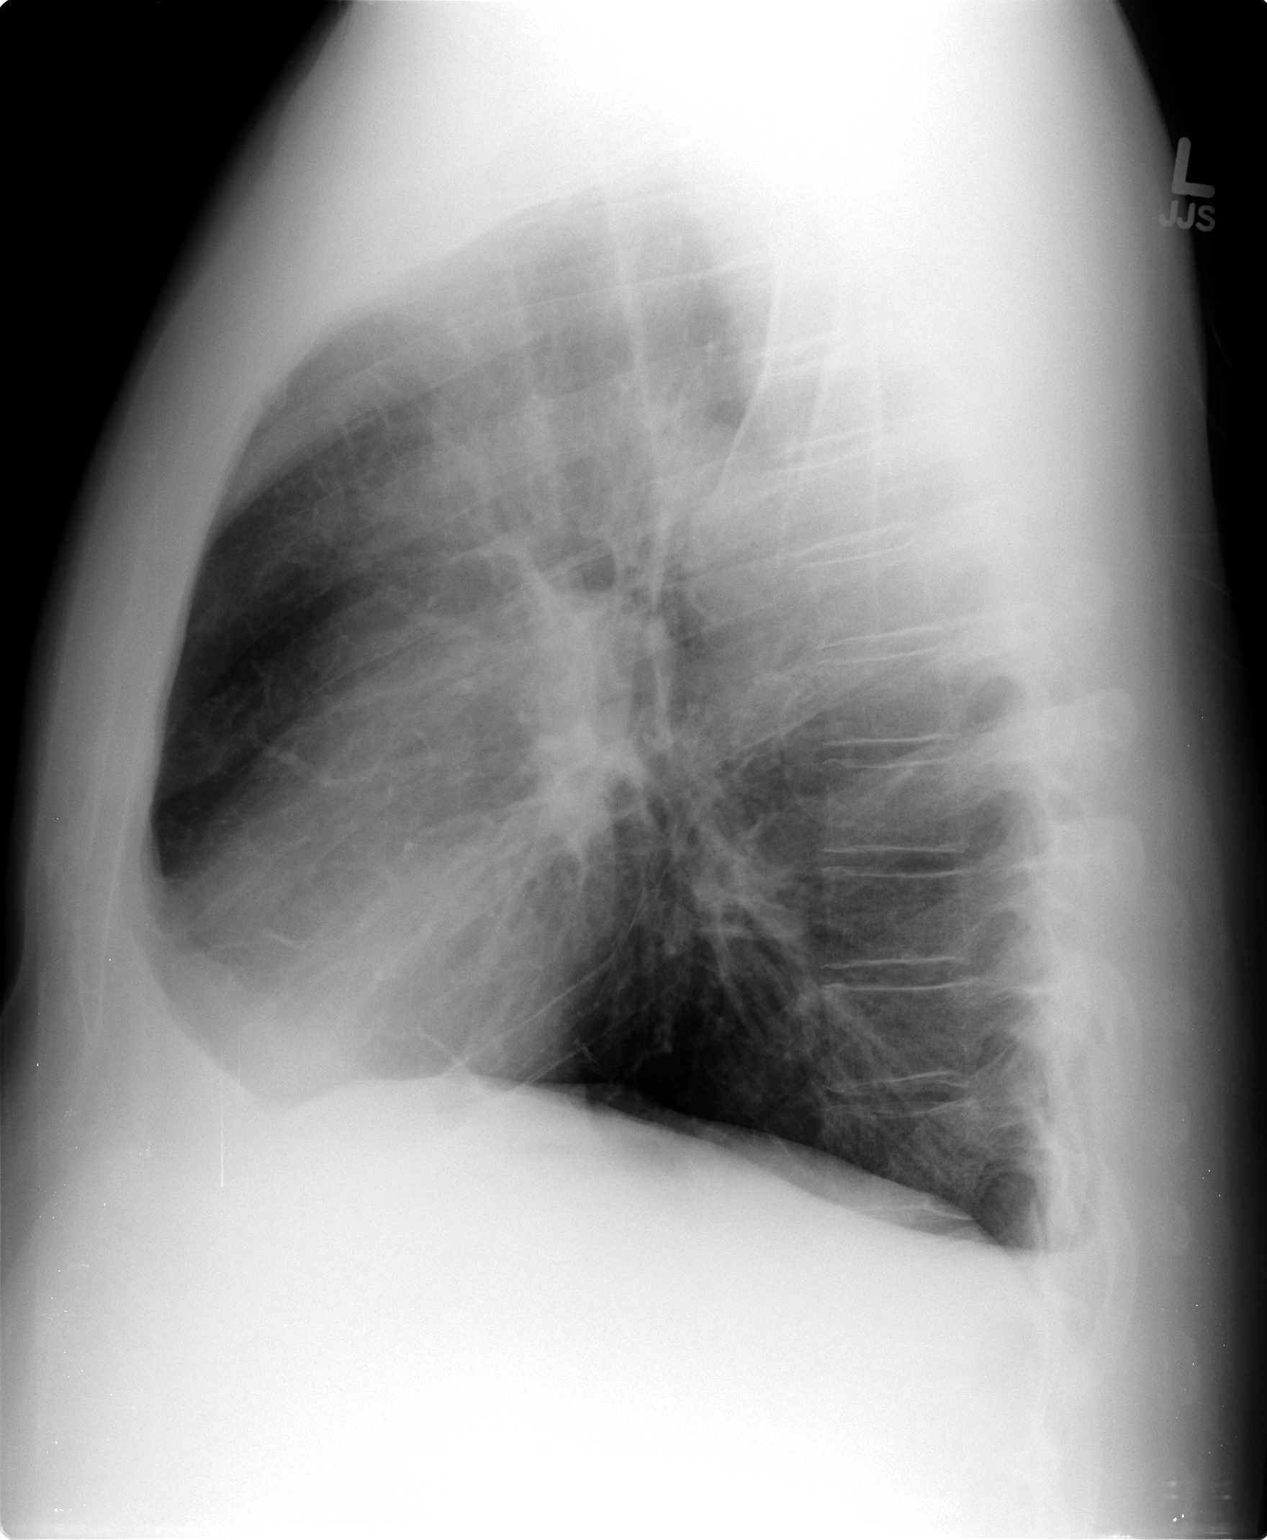

[2 of 2 positions shown; findings below may reference images not displayed]

FINDINGS: Cardiomediastinal silhouette is stable.  No acute
infiltrate or pleural effusion.  No pulmonary edema.
IMPRESSION: No active disease.

## 2011-12-12 MED ORDER — DIAZEPAM 5 MG PO TABS: 5.0000 mg | ORAL_TABLET | Freq: Three times a day (TID) | ORAL | Status: DC | PRN

## 2011-12-12 NOTE — Patient Instructions (Addendum)
Today we updated your med list in our EPIC system...    Continue your current medications the same...    We refilled your meds per request...  Today we did your follow up CXR, EKG, & FASTING blood work...    We will call you w/ the results when avail...  Please collect & mail the "stool cards" back to [place] to check for hidden blood     (we will call only if they are positive indicating that you need to proceed w/ a colonoscopy)  Let's get on track w/ our diet & exercise program...    The goal is to lose 15-20 lbs...  We gave you the combination TETANUS vaccine called the TDAP today...    It should be good for 71yrs...  Call for any concerns.Marland KitchenMarland Kitchen

## 2011-12-12 NOTE — Progress Notes (Signed)
Subjective:    Patient ID: Richard Li, male    DOB: 02-09-1961, 51 y.o.   MRN: [id number]  HPI 51 y/o WM here for a follow up visit & CPX...  ~  [name]:  he was a very heavy smoker and quit in 2007... he has moderate COPD but remarkably preserved PFT's in the past... he was on Symbicort but stopped this on his own...he complains of increased SOB in the heat, and after eating...  notes min cough,  scant sputum, no hemoptysis, no wheezing/ snoring/ daytime hypersomnolence, etc... he notes vague chest discomfort on & off- not always activity related... we discussed pursuing Pulm & Cardiac testing for completeness...  he takes Valium Qhs but wonders about increasing this med...  ~  December 11, 2009:  18yr ROV & doing satis but still notes some SOB w/ activity in hot weather... he's tried Advair & Spiriva but he states that he didn't like them & the TV advertisment scared him... he notes mild cough, min sputum (clear), no CP/ palpit, etc... unfortunately he has gained weight from 203 to 230# at present... we discussed diet + exercise & weight reduction + sample of DULERA 100- 2sp Bid...  ~  January 26, 2010:  presents w/ 31mo hx left chest wall pain- laterally in posterolat ribs, worse at night, not pleuritic, +sl tender, no known trauma... he states Vicodin w/o help... notes assoc cough & sm amt discolored sputum- advised use Dulera Bid + Mucinex Bid w/ fluids... CXR repeat> COPD, NAD & rib details neg; therefore discussed plan to Rx w/ rest/ heat/ binder/ PERCOET & Augmentin... he wants to incr VALIUM to 5mg  Tid- OK... if discomfort persists then consider bone scan/ neuro consult/ pain management ==> f/u note: symptoms finally resolved after one month off work & time to heal rib fxs...  ~  December 06, 2010:  56mo ROV & he reports a visit to [facility] Li last wk for sinus infection given Levaquin & infection symptoms improved but he has persistent congestion, sinus drainage, headache, & cough> we discussed the need  for anti-inflamm therapy w/ Depo120, Pred 3dtaper, Mucinex, Fluids, etc...  He notes otherw stable but has gained 40# in the 59yrs since he's quit smoking;  Breathing stable, denies recurrent CP, chronic stable DOE;  Still trying to control Lipids w/ diet alone but 40# wt gain wil,l shoot that theory all to pieces & he is asked to ret for FLP so we can determine if he needs meds...  ~  December 12, 2011:  Yearly ROV & CPX> [name] has had a good yr, only Rx is Valium prn; no new complaints or concerns...    We reviewed prob list, meds, xrays and labs> see below for updates >> CXR 8/13 showed normal heart size, clear lungs, NAD.Marland KitchenMarland Kitchen EKG 8/13 showed NSR, rate70, wnl, NAD... LABS 8/13:  FLP- chol ok but TG=259;  Chems- wnl;  CBC- wnl;  TSH=2.11;  PSA=0.45;  UA- wnl    Problem List:     CHEST PAIN, ATYPICAL (ICD-786.59) - no prev hx of cardiac problems... risk factors include: +FamHx, Smoking Hx, low HDL, male gender... we will screen w/ a Stress Myoview... he saw [name] 5/08- EKG w/ NSR, IRBBB, NSSTTWA... Myoview was done 8/09 & showed good exerc capacity, normal BP response, no signif EKG changes, normal images w/o ischemia/ scar & EF= 58%... ~  EKG 8/11 shows NSR, WNL.[name]. ~  8/13:  Denies CP, palpit, SOB, edema; BP= 150/80 after rest; exercise= yard work; he  knows to avoid salt & get wt down...  ALLERGIC RHINITIS (ICD-477.9) - uses OTC meds as needed... prev ENT surgery from [name] in 2002 w/ turbinate reduction... he has prominent parotid hypertrophy bilat==> & we discussed Rx w/ sialogogues etc...  COPD (ICD-496) - former heavy smoker and quit in 2007... chronic bronchitic symptoms... prev on Symbicort but he stopped this on his own & refuses Spiriva due to side effects discussed on TV ads...  ~  8/11:  started on DULERA 100-5 2spBid==> he stopped "I don't like meds" ~  CXR 8/13 showed normal heart size, clear lungs, NAD...  ABNORMAL CHEST XRAY (ICD-793.1) - prev CXR's w/ hyperinflation c/w  COPD, 1cm RLL density without change- likely scarring... ~  CTChest 11/07 showed hyperinflation w/ emphysematous changes in the upper lobes, 6mm RML nodule without change, several other scattered sm nodules, pleural thickening, and ca++ in Abd Ao...  ~  baseline CXR shows hyperinflation, some scarring, NAD... ~  PFT's 7/09 showed FVC= 6.61 (135%), FEV1=3.88 (97%), %1sec=59, mid-flows= 44%. ~  CXR 8/11 showed COPD w/ hyperaeration, min scarring, NAD> confirmed 10/11 & Left Rib details= neg. ~  CXR 8/13 showed normal heart size, clear lungs, NAD...  HYPERLIPIDEMIA (ICD-272.4) - on diet therapy alone w/ incr TG assoc w/ recent weight gain... discussed diet + exercise. ~  FLP 8/09 (wt=203#) showed TChol 139, TG 116, HDL 35, LDL 81 ~  FLP 8/11 (wt=230#) showed TChol 145, TG 247, HDL 33, LDL 68... advised diet + exercise, get wt down. ~  FLP 8/13 (wt=237#) showed TChol 139, TG 259, HDL 35, LDL 58  GERD (ICD-530.81) - on PRILOSEC 20mg  Prn for reflux symptoms... ~  He needs screening colonoscopy but wants to wait  -what?  GU> He had Vasectomy from [name] in Jan2012...  BACK PAIN, LUMBAR (ICD-724.2) - prev eval from [name]...  ANXIETY (ICD-300.00) - on VALIUM 5mg  as needed... he states Xanax is too strong...  Health Maintenance - FlexSig 1993 showed rectal fissure otherw neg...   Current Medications, Allergies, Past Medical History, Past Surgical History, Family History, and Social History were reviewed in [employer] record.     Past Surgical History  Procedure Date  . Appendectomy   . Nasal turbinate reduction 2002    Dr.Crossley    Outpatient Encounter Prescriptions as of 12/12/2011  Medication Sig Dispense Refill  . diazepam (VALIUM) 5 MG tablet TAKE ONE TABLET BY MOUTH EVERY 8 HOURS AS NEEDED FOR ANXIETY OR SLEEP  90 tablet  0    Allergies  Allergen Reactions  . Codeine Itching    Current Medications, Allergies, Past Medical History, Past Surgical  History, Family History, and Social History were reviewed in [employer] record.    Review of Systems        See HPI - all other systems neg except as noted...       The patient complains of chest pain and dyspnea on exertion.  The patient denies anorexia, fever, weight loss, weight gain, vision loss, decreased hearing, hoarseness, syncope, peripheral edema, prolonged cough, headaches, hemoptysis, abdominal pain, melena, hematochezia, severe indigestion/heartburn, hematuria, incontinence, muscle weakness, suspicious skin lesions, transient blindness, difficulty walking, depression, unusual weight change, abnormal bleeding, enlarged lymph nodes, and angioedema.     Objective:   Physical Exam      WD, WN, 51 y/o WM in NAD... GENERAL:  Alert & oriented; pleasant & cooperative... HEENT:  [place]/AT, EOM-wnl, PERRLA,  EACs-clear, TMs-wnl, NOSE-purulent drainage, turbinates boggy, THROAT-clear & wnl;  NECK:  Supple w/ fairROM; no JVD; normal carotid impulses w/o bruits; no thyromegaly or nodules palpated; no lymphadenopathy. CHEST:  Decr BS bilat but clear to P & A; without wheezes/ rales/ or rhonchi heard...  sl tender left lat/ post chest wall over ribs... HEART:  Regular Rhythm; without murmurs/ rubs/ or gallops detected... ABDOMEN:  Soft & nontender; normal bowel sounds; no organomegaly or masses palpated... EXT: without deformities, mild arthritic changes; no varicose veins/ venous insuffic/ or edema. NEURO:  CN's intact; motor testing normal; sensory testing normal; gait normal & balance OK. DERM:  No lesions noted; no rash, several tatoos...  RADIOLOGY DATA:  Reviewed in the EPIC EMR & discussed w/ the patient...  LABORATORY DATA:  Reviewed in the EPIC EMR & discussed w/ the patient...   Assessment & Plan:    Hx SINUSITIS>  Prev treated w/ Depo, Pred, Levaquin, Mucinex, etc...  COPD, ex-smoker, Abn CXR>  He refuses the [facility] etc because he doesn't like meds and  doesn't think he needs them.  HYPERLIPIDEMIA>  He has endeavored to control this w/ diet alone, unfortunately he has gained wt since stopping the smoking;  Needs f/u FLP & consideration of med rx...  GERD>  He continues on Prilosec as needed...  LBP>  Stable w/ prev eval from [name]...  Anxiety>  He uses Valium 5mg  prn...   Patient's Medications  New Prescriptions   No medications on file  Previous Medications   No medications on file  Modified Medications   Modified Medication Previous Medication   DIAZEPAM (VALIUM) 5 MG TABLET diazepam (VALIUM) 5 MG tablet      Take 1 tablet (5 mg total) by mouth every 8 (eight) hours as needed for anxiety.    TAKE ONE TABLET BY MOUTH EVERY 8 HOURS AS NEEDED FOR ANXIETY OR SLEEP  Discontinued Medications   No medications on file

## 2012-07-12 ENCOUNTER — Other Ambulatory Visit: Payer: Self-pay | Admitting: Pulmonary Disease

## 2012-07-13 ENCOUNTER — Telehealth: Payer: Self-pay | Admitting: Pulmonary Disease

## 2012-07-13 NOTE — Telephone Encounter (Signed)
This rx was called in to the pharmacy for the pt and i have called and spoke with pt and he is aware. Nothing further is needed.

## 2012-10-17 ENCOUNTER — Telehealth: Payer: Self-pay | Admitting: Pulmonary Disease

## 2012-10-17 ENCOUNTER — Other Ambulatory Visit: Payer: Self-pay | Admitting: Pulmonary Disease

## 2012-10-17 MED ORDER — DIAZEPAM 5 MG PO TABS: ORAL_TABLET | ORAL | Status: DC

## 2012-10-17 NOTE — Telephone Encounter (Signed)
Spoke with pt I scheduled him appt with SN for 12/05/12  He asks for refill on diazepam 5 mg 1 every 8 hrs prn anxiety Last filled on 07/12/12 # 90 with no refills Last ov 12/12/11 Next ov 12/05/12

## 2012-10-17 NOTE — Telephone Encounter (Signed)
Called and spoke with pt and he will keep the appt in august with SN for follow up.  Pt is aware that refills of the diazepam have been called to his pharmacy.  Nothing further is needed.

## 2012-12-05 ENCOUNTER — Ambulatory Visit: Payer: Managed Care, Other (non HMO) | Admitting: Pulmonary Disease

## 2012-12-25 ENCOUNTER — Other Ambulatory Visit (INDEPENDENT_AMBULATORY_CARE_PROVIDER_SITE_OTHER): Payer: Managed Care, Other (non HMO)

## 2012-12-25 ENCOUNTER — Ambulatory Visit (INDEPENDENT_AMBULATORY_CARE_PROVIDER_SITE_OTHER): Payer: Managed Care, Other (non HMO) | Admitting: Pulmonary Disease

## 2012-12-25 ENCOUNTER — Ambulatory Visit (INDEPENDENT_AMBULATORY_CARE_PROVIDER_SITE_OTHER)
Admission: RE | Admit: 2012-12-25 | Discharge: 2012-12-25 | Disposition: A | Discharge status: Home / Self Care | Payer: Managed Care, Other (non HMO) | Source: Ambulatory Visit | Attending: Pulmonary Disease | Admitting: Pulmonary Disease

## 2012-12-25 ENCOUNTER — Encounter: Payer: Self-pay | Admitting: Pulmonary Disease

## 2012-12-25 ENCOUNTER — Ambulatory Visit: Payer: Managed Care, Other (non HMO) | Admitting: Pulmonary Disease

## 2012-12-25 VITALS — BP 140/78 | HR 84 | Temp 98.3°F | Ht 70.0 in | Wt 246.4 lb

## 2012-12-25 DIAGNOSIS — M545 Low back pain, unspecified: Secondary | ICD-10-CM

## 2012-12-25 DIAGNOSIS — Z0000 Encounter for general adult medical examination without abnormal findings: Secondary | ICD-10-CM

## 2012-12-25 DIAGNOSIS — E663 Overweight: Secondary | ICD-10-CM

## 2012-12-25 DIAGNOSIS — J449 Chronic obstructive pulmonary disease, unspecified: Secondary | ICD-10-CM

## 2012-12-25 DIAGNOSIS — E785 Hyperlipidemia, unspecified: Secondary | ICD-10-CM

## 2012-12-25 DIAGNOSIS — K219 Gastro-esophageal reflux disease without esophagitis: Secondary | ICD-10-CM

## 2012-12-25 DIAGNOSIS — K111 Hypertrophy of salivary gland: Secondary | ICD-10-CM | POA: Insufficient documentation

## 2012-12-25 DIAGNOSIS — N200 Calculus of kidney: Secondary | ICD-10-CM | POA: Insufficient documentation

## 2012-12-25 DIAGNOSIS — F411 Generalized anxiety disorder: Secondary | ICD-10-CM

## 2012-12-25 LAB — HEPATIC FUNCTION PANEL
ALT: 33 U/L (ref 0–53)
AST: 32 U/L (ref 0–37)
Albumin: 4 g/dL (ref 3.5–5.2)
Alkaline Phosphatase: 111 U/L (ref 39–117)
Bilirubin, Direct: 0.2 mg/dL (ref 0.0–0.3)
Total Bilirubin: 0.9 mg/dL (ref 0.3–1.2)
Total Protein: 7 g/dL (ref 6.0–8.3)

## 2012-12-25 LAB — BASIC METABOLIC PANEL
BUN: 10 mg/dL (ref 6–23)
CO2: 29 mEq/L (ref 19–32)
Calcium: 9.1 mg/dL (ref 8.4–10.5)
Chloride: 94 mEq/L — ABNORMAL LOW (ref 96–112)
Creatinine, Ser: 0.9 mg/dL (ref 0.4–1.5)
GFR: 90.67 mL/min (ref >60.00)
Glucose, Bld: 98 mg/dL (ref 70–99)
Potassium: 4.2 mEq/L (ref 3.5–5.1)
Sodium: 130 mEq/L — ABNORMAL LOW (ref 135–145)

## 2012-12-25 LAB — CBC WITH DIFFERENTIAL/PLATELET
Basophils Absolute: 0.1 10*3/uL (ref 0.0–0.1)
Basophils Relative: 0.9 % (ref 0.0–3.0)
Eosinophils Absolute: 0.1 10*3/uL (ref 0.0–0.7)
Eosinophils Relative: 1.2 % (ref 0.0–5.0)
HCT: 45.5 % (ref 39.0–52.0)
Hemoglobin: 16 g/dL (ref 13.0–17.0)
Lymphocytes Relative: 27.3 % (ref 12.0–46.0)
Lymphs Abs: 2.5 10*3/uL (ref 0.7–4.0)
MCHC: 35.2 g/dL (ref 30.0–36.0)
MCV: 91.6 fl (ref 78.0–100.0)
Monocytes Absolute: 0.8 10*3/uL (ref 0.1–1.0)
Monocytes Relative: 8.4 % (ref 3.0–12.0)
Neutro Abs: 5.8 10*3/uL (ref 1.4–7.7)
Neutrophils Relative %: 62.2 % (ref 43.0–77.0)
Platelets: 227 10*3/uL (ref 150.0–400.0)
RBC: 4.96 Mil/uL (ref 4.22–5.81)
RDW: 13.5 % (ref 11.5–14.6)
WBC: 9.3 10*3/uL (ref 4.5–10.5)

## 2012-12-25 IMAGING — CR DG CHEST 2V
2 series · 2 of 2 positions shown · non-contrast
Comparison: Chest radiograph [DATE] and [DATE]

CLINICAL DATA: Follow-up COPD.  X smoker.

CHEST - 2 VIEW

[view not recorded (1 of 2)]
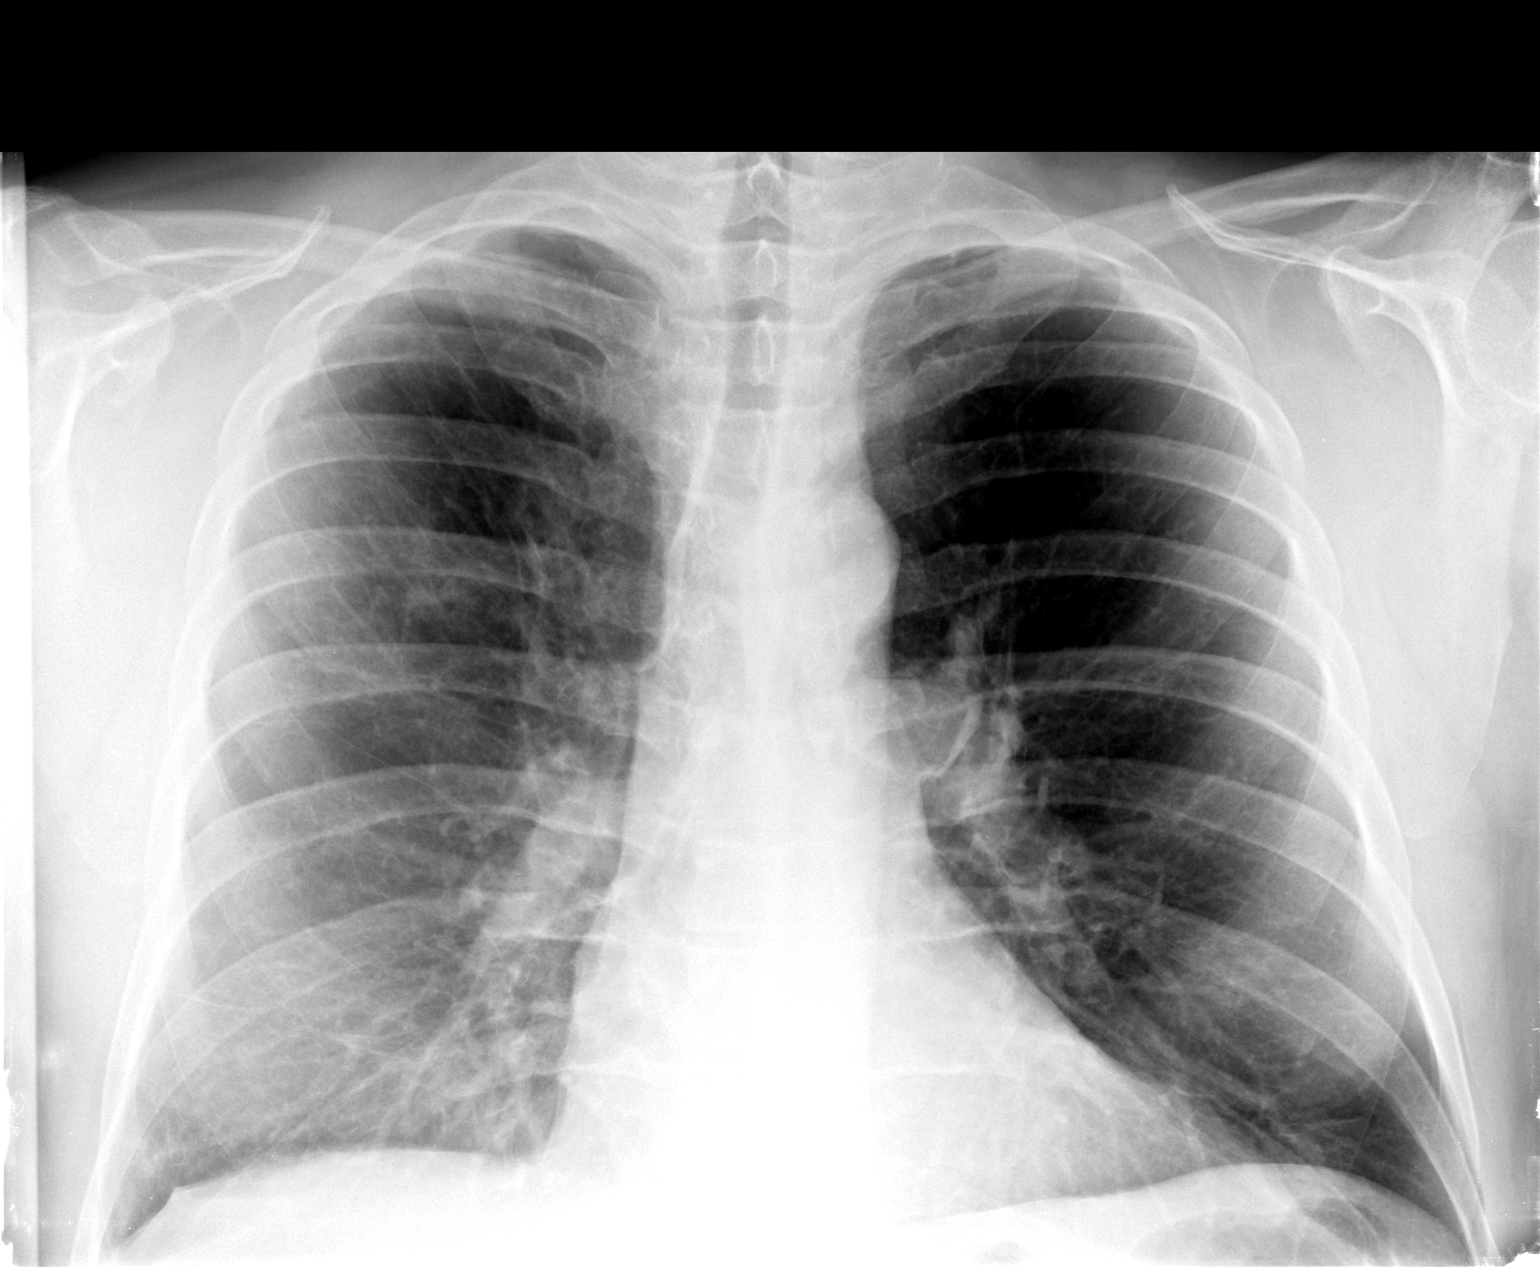

[view not recorded (2 of 2)]
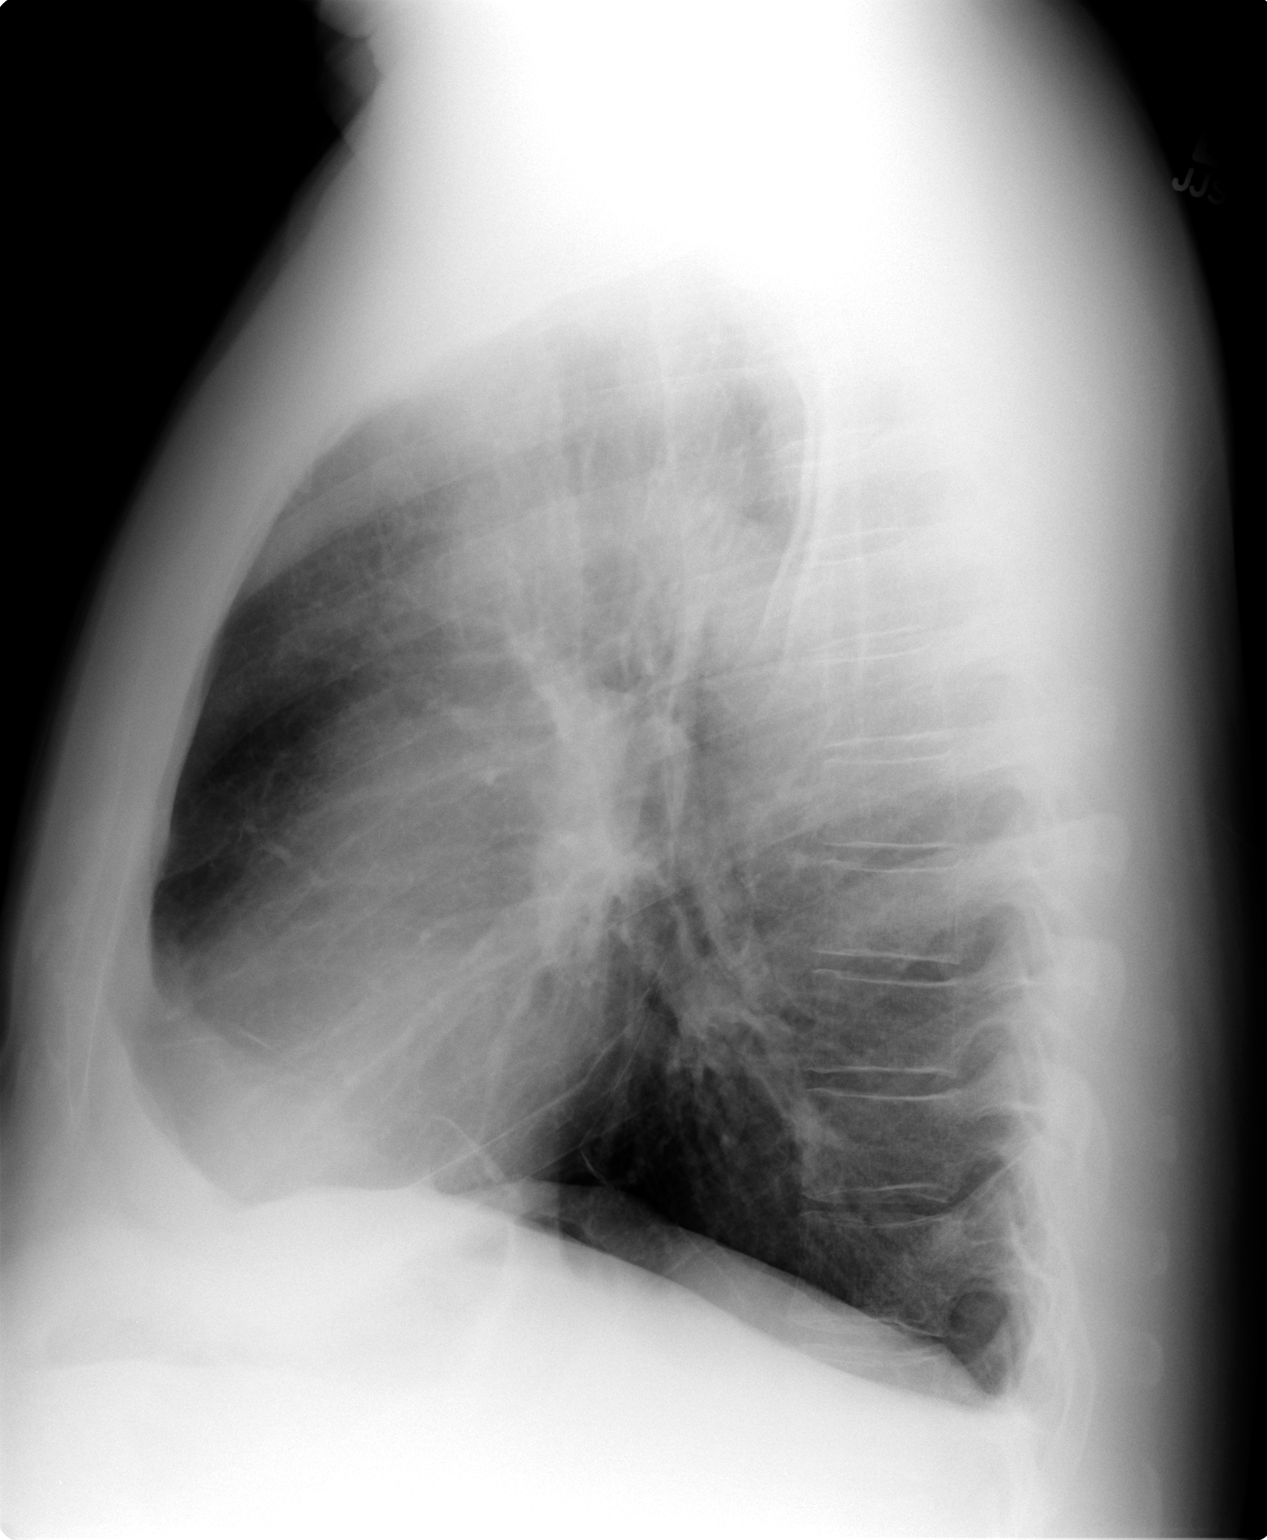

[2 of 2 positions shown; findings below may reference images not displayed]

FINDINGS: The heart, mediastinal, and hilar contours are normal.
Slight peribronchial thickening at the lung bases is stable. Lungs
are hyperinflated.  There is a vague 1.4 cm asymmetric somewhat
nodular opacity in the suprahilar region of the right lung, seen on
the frontal view.  No focal opacities on the left.  Negative for
pleural effusion. No acute osseous abnormality.
IMPRESSION: 1.  Vague asymmetric opacity in the suprahilar region of the right
lung measures 1.4 cm. While this is probably overlap of normal
structures and/or small area of atelectasis, a small pulmonary
nodule cannot be excluded. Further evaluation with noncontrast
chest CT could be considered to exclude a pulmonary nodule.
Alternatively follow-up chest radiograph in approximately 4 weeks
could be considered to see if this area persists.
2.  COPD.

These results will be called to the ordering clinician or
representative by the Radiologist Assistant, and communication
documented in the PACS Dashboard.

## 2012-12-25 MED ORDER — DIAZEPAM 5 MG PO TABS: ORAL_TABLET | ORAL | Status: DC

## 2012-12-25 MED ORDER — MELOXICAM 15 MG PO TABS: 15.0000 mg | ORAL_TABLET | Freq: Every day | ORAL | Status: DC

## 2012-12-25 NOTE — Progress Notes (Signed)
Subjective:    Patient ID: Richard Li, male    DOB: Nov 26, 1960, 52 y.o.   MRN: [id number]  HPI 52 y/o WM here for a follow up visit & CPX...  ~  [name]:  he was a very heavy smoker and quit in 2007... he has moderate COPD but remarkably preserved PFT's in the past... he was on Symbicort but stopped this on his own...he complains of increased SOB in the heat, and after eating...  notes min cough,  scant sputum, no hemoptysis, no wheezing/ snoring/ daytime hypersomnolence, etc... he notes vague chest discomfort on & off- not always activity related... we discussed pursuing Pulm & Cardiac testing for completeness...  he takes Valium Qhs but wonders about increasing this med...  ~  December 11, 2009:  16yr ROV & doing satis but still notes some SOB w/ activity in hot weather... he's tried Advair & Spiriva but he states that he didn't like them & the TV advertisment scared him... he notes mild cough, min sputum (clear), no CP/ palpit, etc... unfortunately he has gained weight from 203 to 230# at present... we discussed diet + exercise & weight reduction + sample of DULERA 100- 2sp Bid...  ~  January 26, 2010:  presents w/ 68mo hx left chest wall pain- laterally in posterolat ribs, worse at night, not pleuritic, +sl tender, no known trauma... he states Vicodin w/o help... notes assoc cough & sm amt discolored sputum- advised use Dulera Bid + Mucinex Bid w/ fluids... CXR repeat> COPD, NAD & rib details neg; therefore discussed plan to Rx w/ rest/ heat/ binder/ PERCOET & Augmentin... he wants to incr VALIUM to 5mg  Tid- OK... if discomfort persists then consider bone scan/ neuro consult/ pain management ==> f/u note: symptoms finally resolved after one month off work & time to heal rib fxs...  ~  December 06, 2010:  51mo ROV & he reports a visit to [facility] last wk for sinus infection given Levaquin & infection symptoms improved but he has persistent congestion, sinus drainage, headache, & cough> we discussed the need  for anti-inflamm therapy w/ Depo120, Pred 3dtaper, Mucinex, Fluids, etc...  He notes otherw stable but has gained 40# in the 79yrs since he's quit smoking;  Breathing stable, denies recurrent CP, chronic stable DOE;  Still trying to control Lipids w/ diet alone but 40# wt gain wil,l shoot that theory all to pieces & he is asked to ret for FLP so we can determine if he needs meds...  ~  December 12, 2011:  Yearly ROV & CPX> [name] has had a good yr, only Rx is Valium prn; no new complaints or concerns...    We reviewed prob list, meds, xrays and labs> see below for updates >> CXR 8/13 showed normal heart size, clear lungs, NAD.Marland KitchenMarland Kitchen EKG 8/13 showed NSR, rate70, wnl, NAD... LABS 8/13:  FLP- chol ok but TG=259;  Chems- wnl;  CBC- wnl;  TSH=2.11;  PSA=0.45;  UA- wnl   ~  December 25, 2012:  Yearly ROV & CPX> [name] reports a good year overall- he fell in his yard on his right arm 1 wk ago w/ pain in the elbow area & we discussed hot soaks, Mobic15 & OV w/ XRays by [name]... He also reports seeing [name], Derm & started on Lamisil orally- needs LFTs checked & copy to Derm- OK... We reviewed the following medical problems during today's office visit >>     HxAR> he has seen [name] in the past w/ sinus turbinate  reduction in 2002; asked to avoid antihist due to parotid dis...    Parotid Hypertrophy> known bilat parotid swelling, no change & he denies dry mouth symptoms or sequelae, rec for sialogogues...     COPD> former very heavy smoker, quit 2007; seen prev CXR, CTChest, PFTs below; notes min cough, not sput, no hemoptysis, denies SOB, etc...    Hyperlipid> on low fat diet alone but remains overweight & is up 9# this yr; last FLP 8/13 showed TChol 139, TG 259, HDL 35, LDL 58; rec for better low fat diet & get wt down.    Overweight> weight is up 9# to 246# this yr; BMI ~35 and we reviewed diet, exercise, & wt reduction strategies...    GI- GERD, needs screening colon> he uses OTC Prilosec prn; denies  dysphagia, abd pain, n/v, c/d, blood seen; needs screening colon...     GU- he's had prev vasectomy by [name]... Denies LTOS etc...    HxLBP> prev ortho eval by [name]... He uses OTC analgesics as needed...    Anxiety> on Valium5 as needed & requests refill...    Onychomycosis> he had eval by [name], [name], & was started on Lamisil orally,,,  We reviewed prob list, meds, xrays and labs> see below for updates >>  CXR 9/14 showed COPD, patchy opac RUL- ?inflamm, ?atx, ?other... We decided to treat w/ Mucinex, Fluids, & repeat CXR 43mo... LABS 9/14 (not fasting today):  Chems- ok x Na=130, LFTs= wnl;  CBC- wnl;  TSH=2.38;  PSA=0.43...   ADDENDUM>> follow up CXR 10/14 showed resolution of prev RUL opac; norm heart size, mild hyperinflation, NAD...         Problem List:     Bilat PAROTID GLAND HYPERTROPHY >> known parotid hypertrophy, no nodules palpated and normal salivary secretions & dentition; we reviwed the rec for sialogogues such as lemon drops etc...  CHEST PAIN, ATYPICAL (ICD-786.59) - no prev hx of cardiac problems... risk factors include: +FamHx, Smoking Hx, low HDL, male gender... we will screen w/ a Stress Myoview... he saw [name] 5/08- EKG w/ NSR, IRBBB, NSSTTWA... Myoview was done 8/09 & showed good exerc capacity, normal BP response, no signif EKG changes, normal images w/o ischemia/ scar & EF= 58%... ~  EKG 8/11 shows NSR, WNL.[name]. ~  8/13:  Denies CP, palpit, SOB, edema; BP= 150/80 after rest; exercise= yard work; he knows to avoid salt & get wt down...  ALLERGIC RHINITIS (ICD-477.9) - uses OTC meds as needed... prev ENT surgery from [name] in 2002 w/ turbinate reduction... he has prominent parotid hypertrophy bilat==> & we discussed Rx w/ sialogogues etc...  COPD (ICD-496) - former heavy smoker and quit in 2007... chronic bronchitic symptoms... prev on Symbicort but he stopped this on his own & refuses Spiriva due to side effects discussed on TV ads...  ABNORMAL  CHEST XRAY (ICD-793.1) - prev CXR's w/ hyperinflation c/w COPD, 1cm RLL density without change- likely scarring... ~  CTChest 11/07 showed hyperinflation w/ emphysematous changes in the upper lobes, 6mm RML nodule without change, several other scattered sm nodules, pleural thickening, and ca++ in Abd Ao...  ~  baseline CXR shows hyperinflation, some scarring, NAD... ~  PFT's 7/09 showed FVC= 6.61 (135%), FEV1=3.88 (97%), %1sec=59, mid-flows= 44%. ~  CXR 8/11 showed COPD w/ hyperaeration, min scarring, NAD> confirmed 10/11 & Left Rib details= neg. ~  8/11:  started on DULERA 100-5 2spBid==> he stopped "I don't like meds" ~  CXR 8/13 showed normal heart size, clear lungs, NAD.Marland KitchenMarland Kitchen ~  CXR 9/14 showed COPD, patchy opac RUL- ?inflamm, ?atx, ?other... We decided to treat w/ Mucinex, Fluids, & repeat CXR 59mo=> f/u film showed resolution of RUL opacity...  HYPERLIPIDEMIA (ICD-272.4) - on diet therapy alone w/ incr TG assoc w/ recent weight gain... discussed diet + exercise. ~  FLP 8/09 (wt=203#) showed TChol 139, TG 116, HDL 35, LDL 81 ~  FLP 8/11 (wt=230#) showed TChol 145, TG 247, HDL 33, LDL 68... advised diet + exercise, get wt down. ~  FLP 8/13 (wt=237#) showed TChol 139, TG 259, HDL 35, LDL 58 ~  9/14: pt not fasting for f/u FLP- he's gained 9# & we reviewed need for low carb, low fat diet & wt reduction...  OBESITY >> we reviewed diet recommendations, exercise prescription, & wt reduction strategies.. ~  Weight 8/13 = 237#, BMI= 34 ~  Weight 9/14 = 246#  GERD (ICD-530.81) - on PRILOSEC 20mg  Prn for reflux symptoms... ~  He needs screening colonoscopy but wants to wait... We will refer chart to GI...  GU> He had Vasectomy from [name] in Jan2012...  BACK PAIN, LUMBAR (ICD-724.2) - prev eval from [name]...  ANXIETY (ICD-300.00) - on VALIUM 5mg  as needed... he states Xanax is too strong...  Health Maintenance - FlexSig 1993 showed rectal fissure otherw neg...   Current Medications,  Allergies, Past Medical History, Past Surgical History, Family History, and Social History were reviewed in [employer] record.     Past Surgical History  Procedure Laterality Date  . Appendectomy    . Nasal turbinate reduction  2002    Dr.Crossley    Outpatient Encounter Prescriptions as of 12/25/2012  Medication Sig Dispense Refill  . diazepam (VALIUM) 5 MG tablet TAKE ONE TABLET BY MOUTH EVERY 8 HOURS AS NEEDED FOR ANXIETY  90 tablet  1  . terbinafine (LAMISIL) 250 MG tablet Take 250 mg by mouth daily.       No facility-administered encounter medications on file as of 12/25/2012.    Allergies  Allergen Reactions  . Codeine Itching    Current Medications, Allergies, Past Medical History, Past Surgical History, Family History, and Social History were reviewed in [employer] record.    Review of Systems        See HPI - all other systems neg except as noted...       The patient complains of chest pain and dyspnea on exertion.  The patient denies anorexia, fever, weight loss, weight gain, vision loss, decreased hearing, hoarseness, syncope, peripheral edema, prolonged cough, headaches, hemoptysis, abdominal pain, melena, hematochezia, severe indigestion/heartburn, hematuria, incontinence, muscle weakness, suspicious skin lesions, transient blindness, difficulty walking, depression, unusual weight change, abnormal bleeding, enlarged lymph nodes, and angioedema.     Objective:   Physical Exam      WD, WN, 52 y/o WM in NAD... GENERAL:  Alert & oriented; pleasant & cooperative... HEENT:  [place]/AT, EOM-wnl, PERRLA,  EACs-clear, TMs-wnl, NOSE-purulent drainage, turbinates boggy, THROAT-clear & wnl;  NECK:  Supple w/ fairROM; no JVD; normal carotid impulses w/o bruits; no thyromegaly or nodules palpated; no lymphadenopathy. CHEST:  Decr BS bilat but clear to P & A; without wheezes/ rales/ or rhonchi heard...  sl tender left lat/ post chest wall  over ribs... HEART:  Regular Rhythm; without murmurs/ rubs/ or gallops detected... ABDOMEN:  Soft & nontender; normal bowel sounds; no organomegaly or masses palpated... EXT: without deformities, mild arthritic changes; no varicose veins/ venous insuffic/ or edema. NEURO:  CN's intact; motor testing normal; sensory  testing normal; gait normal & balance OK. DERM:  No lesions noted; no rash, several tatoos...  RADIOLOGY DATA:  Reviewed in the EPIC EMR & discussed w/ the patient...  LABORATORY DATA:  Reviewed in the EPIC EMR & discussed w/ the patient...   Assessment & Plan:    Hx AR, SINUSITIS, Parotid hypertrophy>  Prev treated sinus w/ Depo, Pred, Levaquin, Mucinex, etc... rec to avoid antihist, use sialogogues...  COPD, ex-smoker, Abn CXR>  He refuses the [facility] etc because he doesn't like meds and doesn't think he needs them; ?RUL opac- Rx mucinex fluids, f/u CXR=> resolved...  HYPERLIPIDEMIA>  He has endeavored to control this w/ diet alone, unfortunately he has gained wt since stopping the smoking;  Needs f/u FLP & consideration of med rx...  GERD>  He continues on Prilosec as needed...  LBP>  Stable w/ prev eval from [name]...  Anxiety>  He uses Valium 5mg  prn...   Patient's Medications  New Prescriptions   MELOXICAM (MOBIC) 15 MG TABLET    Take 1 tablet (15 mg total) by mouth daily.  Previous Medications   TERBINAFINE (LAMISIL) 250 MG TABLET    Take 250 mg by mouth daily.  Modified Medications   Modified Medication Previous Medication   DIAZEPAM (VALIUM) 5 MG TABLET diazepam (VALIUM) 5 MG tablet      TAKE ONE TABLET BY MOUTH EVERY 8 HOURS AS NEEDED FOR ANXIETY    TAKE ONE TABLET BY MOUTH EVERY 8 HOURS AS NEEDED FOR ANXIETY  Discontinued Medications   No medications on file

## 2012-12-25 NOTE — Patient Instructions (Addendum)
Today we updated your med list in our EPIC system...    Continue your current medications the same...     We refilled your Valium per request...  We wrote a new prescription for an anti-inflammatory med for your right arm- MELOXICAM 15mg  take one tab daily as needed...  If the arm continues to hurt then we will need for you to see an Orthopedist- I rec Dr. Laroy Apple at the hand center (near your dermatologist)     His number is [phone]  Today we did your follow up CXR & nonfasting blood work...    We will contact you w/ the results when available...     We will also send copies to [name]...  Call for any questions.Marland KitchenMarland Kitchen

## 2012-12-26 ENCOUNTER — Other Ambulatory Visit: Payer: Self-pay | Admitting: Pulmonary Disease

## 2012-12-26 DIAGNOSIS — J449 Chronic obstructive pulmonary disease, unspecified: Secondary | ICD-10-CM

## 2012-12-26 LAB — PSA: PSA: 0.43 ng/mL (ref 0.10–4.00)

## 2012-12-26 LAB — TSH: TSH: 2.38 u[IU]/mL (ref 0.35–5.50)

## 2013-01-21 ENCOUNTER — Telehealth: Payer: Self-pay | Admitting: Pulmonary Disease

## 2013-01-21 ENCOUNTER — Ambulatory Visit (INDEPENDENT_AMBULATORY_CARE_PROVIDER_SITE_OTHER)
Admission: RE | Admit: 2013-01-21 | Discharge: 2013-01-21 | Disposition: A | Discharge status: Home / Self Care | Payer: Managed Care, Other (non HMO) | Source: Ambulatory Visit | Attending: Pulmonary Disease | Admitting: Pulmonary Disease

## 2013-01-21 DIAGNOSIS — J449 Chronic obstructive pulmonary disease, unspecified: Secondary | ICD-10-CM

## 2013-01-21 DIAGNOSIS — J4489 Other specified chronic obstructive pulmonary disease: Secondary | ICD-10-CM

## 2013-01-21 IMAGING — CR DG CHEST 2V
2 series · 2 of 2 positions shown · non-contrast
Comparison: [DATE]

CLINICAL DATA: Right upper lobe density.

EXAM:
CHEST  2 VIEW

[view not recorded (1 of 2)]
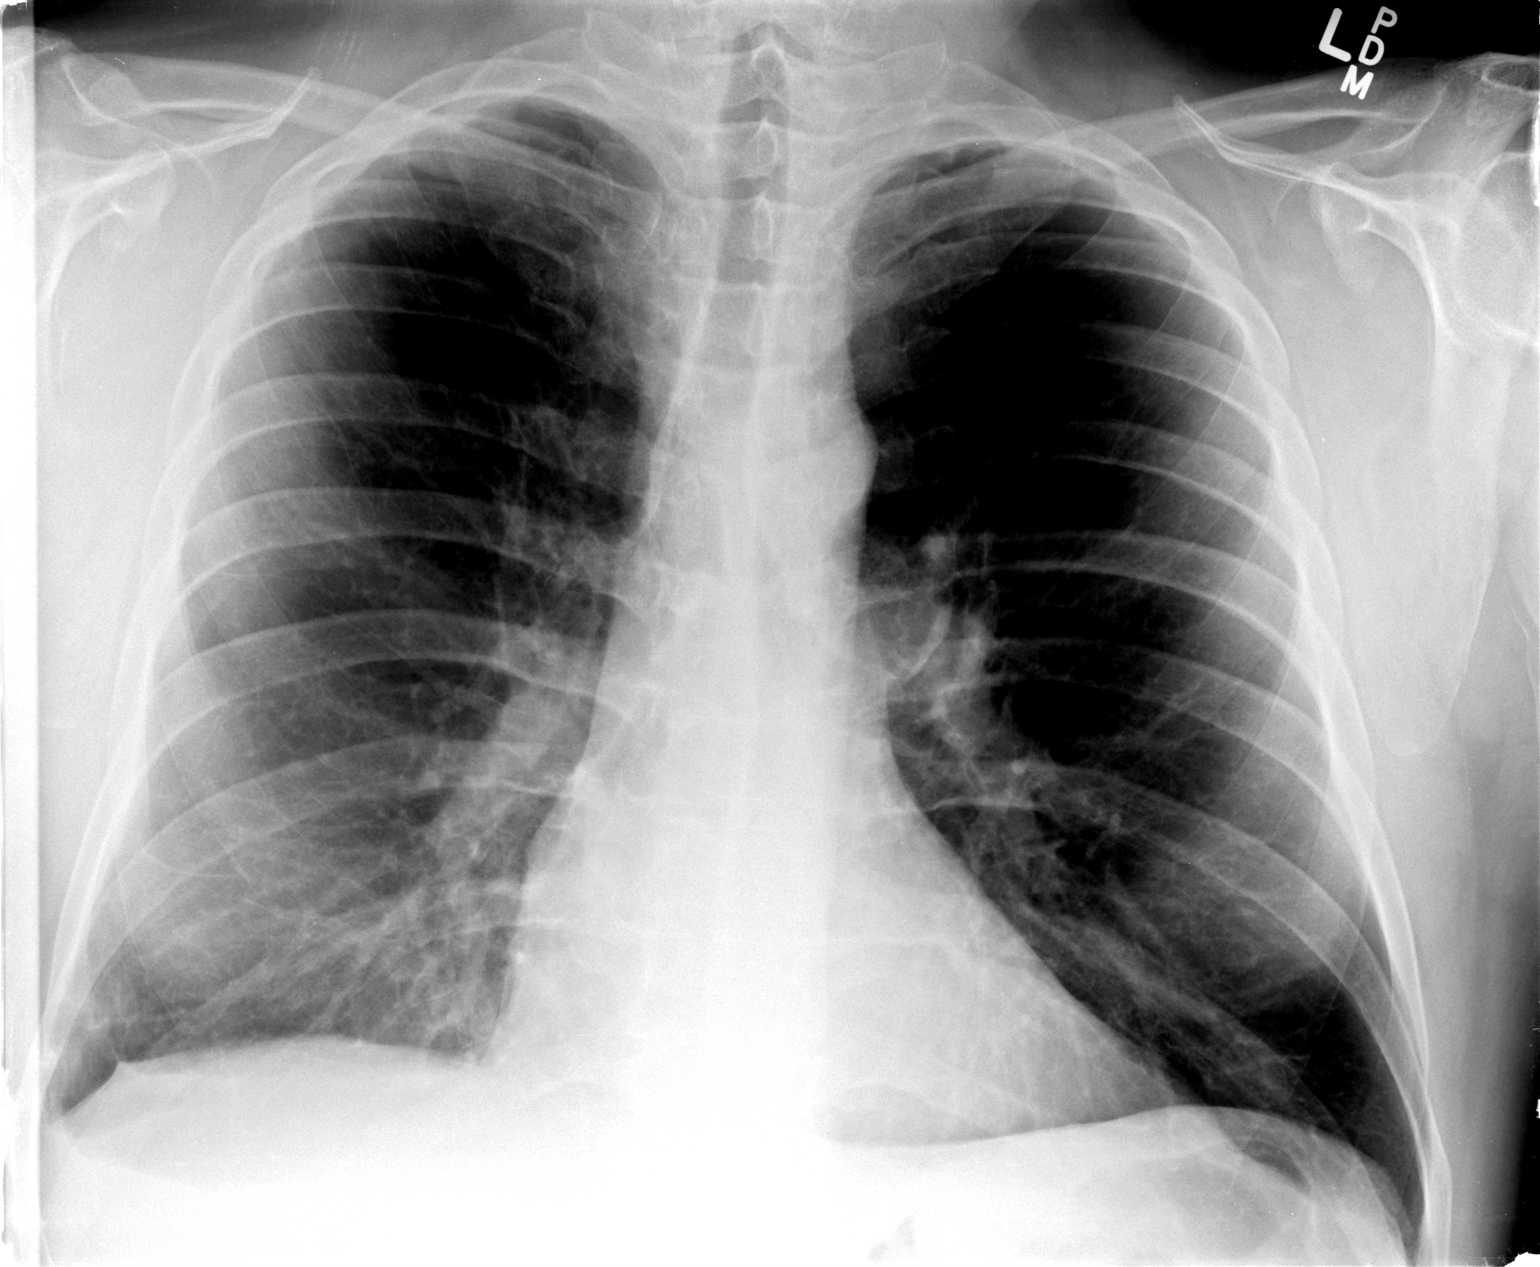

[view not recorded (2 of 2)]
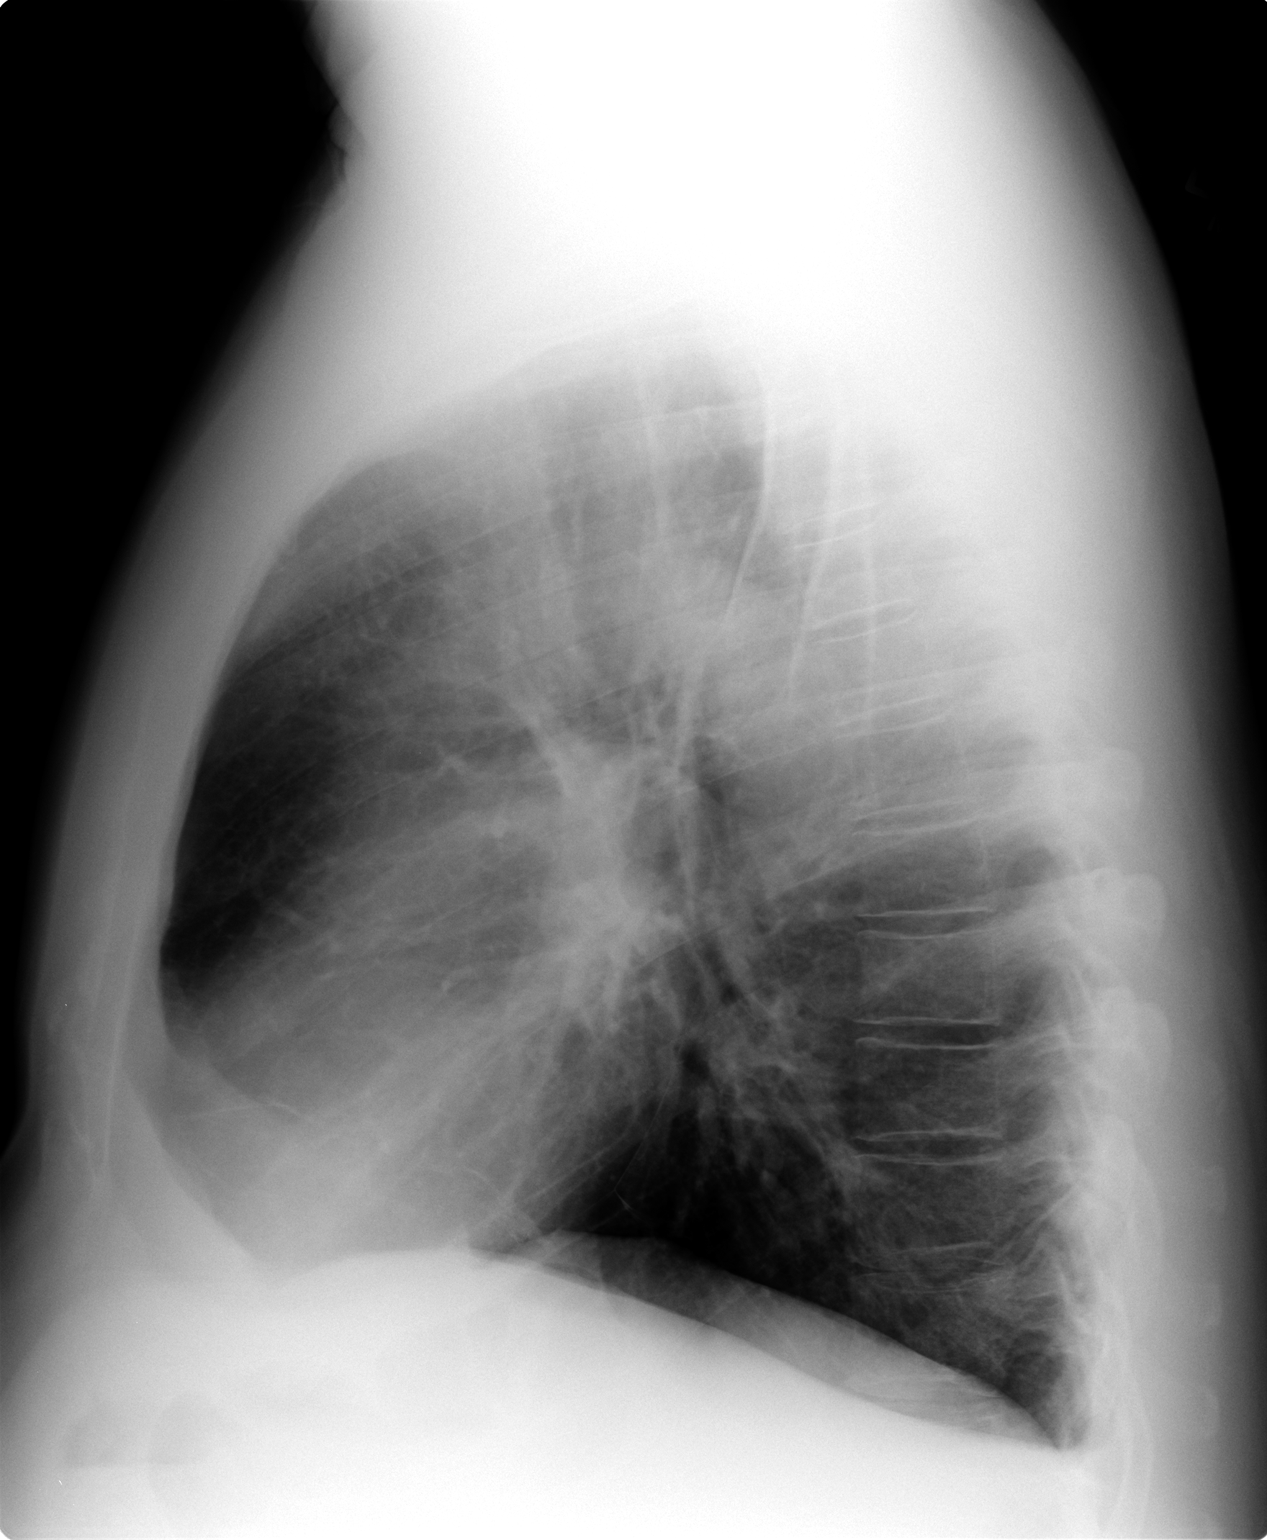

[2 of 2 positions shown; findings below may reference images not displayed]

FINDINGS: Previously seen right upper lobe density not visualized on today's
study and may have represented overlapping shadows. Mild
hyperinflation. Heart is normal size. No focal opacities or
effusions.
IMPRESSION: No right upper lobe nodular density seen on today's study.

## 2013-01-21 NOTE — Telephone Encounter (Signed)
Per 9.2.14 cxr result note: Result Notes    Notes Recorded by Abigail Miyamoto, RN on 12/26/2012 at 11:27 AM Pt notified of cxr results per Dr Kriste Basque. Order placed for repeat cxr. ------  Notes Recorded by Michele Mcalpine, MD on 12/26/2012 at 8:50 AM Please notify patient>  CXR shows underlying COPD and ?area in RUL- ?inflammation, ?atelectasis, r/o developing nodule> REC- Mucinex one Bid, fluids, concentrate on cough & deep breathing- REPEAT PA CXR in one month w/o fail to see if CT is necessary...   Pt calling again per Rachael Fee with patient who reported that he will be going out of town and would like to come today for his repeat cxr and wants to know if the order is already in the system.  Advised pt that yes, it is and he may come if he would like but SN may not review the results until tomorrow.  Pt okay with this and verbalized his understanding.  Nothing further needed; will sign off.

## 2013-01-30 ENCOUNTER — Other Ambulatory Visit: Payer: Self-pay | Admitting: Pulmonary Disease

## 2013-01-30 DIAGNOSIS — K219 Gastro-esophageal reflux disease without esophagitis: Secondary | ICD-10-CM

## 2013-02-18 ENCOUNTER — Encounter: Payer: Self-pay | Admitting: Internal Medicine

## 2013-04-08 ENCOUNTER — Ambulatory Visit (AMBULATORY_SURGERY_CENTER): Payer: Self-pay | Admitting: *Deleted

## 2013-04-08 VITALS — Ht 70.0 in | Wt 238.8 lb

## 2013-04-08 DIAGNOSIS — Z1211 Encounter for screening for malignant neoplasm of colon: Secondary | ICD-10-CM

## 2013-04-08 MED ORDER — MOVIPREP 100 G PO SOLR: ORAL | Status: DC

## 2013-04-08 NOTE — Progress Notes (Signed)
Patient denies any allergies to eggs or soy. Patient denies any problems with anesthesia.  

## 2013-04-10 ENCOUNTER — Encounter: Payer: Self-pay | Admitting: Internal Medicine

## 2013-04-22 ENCOUNTER — Encounter: Payer: Self-pay | Admitting: Internal Medicine

## 2013-04-22 ENCOUNTER — Ambulatory Visit (AMBULATORY_SURGERY_CENTER): Payer: Managed Care, Other (non HMO) | Admitting: Internal Medicine

## 2013-04-22 VITALS — BP 141/87 | HR 72 | Temp 98.6°F | Resp 14 | Ht 70.0 in | Wt 238.0 lb

## 2013-04-22 DIAGNOSIS — Z1211 Encounter for screening for malignant neoplasm of colon: Secondary | ICD-10-CM

## 2013-04-22 MED ORDER — SODIUM CHLORIDE 0.9 % IV SOLN: 500.0000 mL | INTRAVENOUS | Status: DC

## 2013-04-22 NOTE — Patient Instructions (Signed)
YOU HAD AN ENDOSCOPIC PROCEDURE TODAY AT THE [facility]: Refer to the procedure report that was given to you for any specific questions about what was found during the examination.  If the procedure report does not answer your questions, please call your gastroenterologist to clarify.  If you requested that your care partner not be given the details of your procedure findings, then the procedure report has been included in a sealed envelope for you to review at your convenience later.  YOU SHOULD EXPECT: Some feelings of bloating in the abdomen. Passage of more gas than usual.  Walking can help get rid of the air that was put into your GI tract during the procedure and reduce the bloating. If you had a lower endoscopy (such as a colonoscopy or flexible sigmoidoscopy) you may notice spotting of blood in your stool or on the toilet paper. If you underwent a bowel prep for your procedure, then you may not have a normal bowel movement for a few days.  DIET: Your first meal following the procedure should be a light meal and then it is ok to progress to your normal diet.  A half-sandwich or bowl of soup is an example of a good first meal.  Heavy or fried foods are harder to digest and may make you feel nauseous or bloated.  Likewise meals heavy in dairy and vegetables can cause extra gas to form and this can also increase the bloating.  Drink plenty of fluids but you should avoid alcoholic beverages for 24 hours.  ACTIVITY: Your care partner should take you home directly after the procedure.  You should plan to take it easy, moving slowly for the rest of the day.  You can resume normal activity the day after the procedure however you should NOT DRIVE or use heavy machinery for 24 hours (because of the sedation medicines used during the test).    SYMPTOMS TO REPORT IMMEDIATELY: A gastroenterologist can be reached at any hour.  During normal business hours, 8:30 AM to 5:00 PM Monday through Friday,  call [phone].  After hours and on weekends, please call the GI answering service at [phone] who will take a message and have the physician on call contact you.   Following lower endoscopy (colonoscopy or flexible sigmoidoscopy):  Excessive amounts of blood in the stool  Significant tenderness or worsening of abdominal pains  Swelling of the abdomen that is new, acute  Fever of 100F or higher    FOLLOW UP: If any biopsies were taken you will be contacted by phone or by letter within the next 1-3 weeks.  Call your gastroenterologist if you have not heard about the biopsies in 3 weeks.  Our staff will call the home number listed on your records the next business day following your procedure to check on you and address any questions or concerns that you may have at that time regarding the information given to you following your procedure. This is a courtesy call and so if there is no answer at the home number and we have not heard from you through the emergency physician on call, we will assume that you have returned to your regular daily activities without incident.  SIGNATURES/CONFIDENTIALITY: You and/or your care partner have signed paperwork which will be entered into your electronic medical record.  These signatures attest to the fact that that the information above on your After Visit Summary has been reviewed and is understood.  Full responsibility of the confidentiality   of this discharge information lies with you and/or your care-partner.     

## 2013-04-22 NOTE — Progress Notes (Signed)
Procedure ends, to recovery, report given and VSS. 

## 2013-04-22 NOTE — Op Note (Signed)
[facility] [address] [place], [address]   COLONOSCOPY PROCEDURE REPORT  PATIENT: Richard Li, Richard Li  MR#: [id number] BIRTHDATE: 04/23/1961 , 52  yrs. old GENDER: Male ENDOSCOPIST: Roxy Cedar, MD REFERRED QI:ONGEX Kriste Basque, M.D. PROCEDURE DATE:  04/22/2013 PROCEDURE:   Colonoscopy, screening First Screening Colonoscopy - Avg.  risk and is 50 yrs.  old or older Yes.  Prior Negative Screening - Now for repeat screening. N/A  History of Adenoma - Now for follow-up colonoscopy & has been > or = to 3 yrs.  N/A  Polyps Removed Today? No.  Recommend repeat exam, <10 yrs? No. ASA CLASS:   Class II INDICATIONS:average risk screening. MEDICATIONS: MAC sedation, administered by CRNA and propofol (Diprivan) 330mg  IV  DESCRIPTION OF PROCEDURE:   After the risks benefits and alternatives of the procedure were thoroughly explained, informed consent was obtained.  A digital rectal exam revealed no abnormalities of the rectum.   The [id number]  endoscope was introduced through the anus and advanced to the cecum, which was identified by both the appendix and ileocecal valve. No adverse events experienced.   The quality of the prep was excellent, using MoviPrep  The instrument was then slowly withdrawn as the colon was fully examined.      COLON FINDINGS: A normal appearing cecum, ileocecal valve, and appendiceal orifice were identified.  The ascending, hepatic flexure, transverse, splenic flexure, descending, sigmoid colon and rectum appeared unremarkable.  No polyps or cancers were seen. Retroflexed views revealed internal hemorrhoids. The time to cecum=1 minutes 44 seconds.  Withdrawal time=7 minutes 50 seconds. The scope was withdrawn and the procedure completed.  COMPLICATIONS: There were no complications.  ENDOSCOPIC IMPRESSION: 1. Normal colon  RECOMMENDATIONS: 1. Continue current colorectal screening recommendations for "routine risk" patients  with a repeat colonoscopy in 10 years.   eSigned:  Roxy Cedar, MD 04/22/2013 11:27 AM   cc: Michele Mcalpine, MD and The Patient

## 2013-04-23 ENCOUNTER — Telehealth: Payer: Self-pay

## 2013-04-23 NOTE — Telephone Encounter (Signed)
  Follow up Call-  Call back number 04/22/2013  Post procedure Call Back phone  # [phone]  Permission to leave phone message Yes     Patient questions:  Do you have a fever, pain , or abdominal swelling? no Pain Score  0 *  Have you tolerated food without any problems? yes  Have you been able to return to your normal activities? yes  Do you have any questions about your discharge instructions: Diet   no Medications  no Follow up visit  no  Do you have questions or concerns about your Care? no  Actions: * If pain score is 4 or above: No action needed, pain <4.

## 2013-06-05 DIAGNOSIS — B350 Tinea barbae and tinea capitis: Secondary | ICD-10-CM

## 2013-08-06 ENCOUNTER — Telehealth: Payer: Self-pay | Admitting: *Deleted

## 2013-08-06 NOTE — Telephone Encounter (Signed)
Give me a call back.  I saw doctor about ingrown toenail toenail and fungus.  I been using the polish and the Lamisil.  He said if that doesn't work we would consider doing light.  I asked if he's referring to the laser.  He stated yes.  I informed him that insurance does not pay for laser of nails.  He said that is what Dr. Paulla Dolly had told him.  He asked the cost and how to go about setting that up.  I transferred him to [facility] at check out to get his questions answered.

## 2013-09-17 ENCOUNTER — Ambulatory Visit: Payer: Managed Care, Other (non HMO)

## 2013-12-06 ENCOUNTER — Encounter: Payer: Self-pay | Admitting: Pulmonary Disease

## 2013-12-20 ENCOUNTER — Ambulatory Visit (INDEPENDENT_AMBULATORY_CARE_PROVIDER_SITE_OTHER): Payer: BC Managed Care – PPO

## 2013-12-20 VITALS — BP 147/86 | HR 86 | Resp 12

## 2013-12-20 DIAGNOSIS — L608 Other nail disorders: Secondary | ICD-10-CM

## 2013-12-20 DIAGNOSIS — B351 Tinea unguium: Secondary | ICD-10-CM

## 2013-12-20 DIAGNOSIS — L603 Nail dystrophy: Secondary | ICD-10-CM

## 2013-12-20 MED ORDER — EFINACONAZOLE 10 % EX SOLN: CUTANEOUS | Status: DC

## 2013-12-20 NOTE — Patient Instructions (Signed)

## 2013-12-20 NOTE — Progress Notes (Signed)
   Subjective:    Patient ID: Richard Li, male    DOB: [birth year], 53 y.o.   MRN: [id number]  HPI  PT STATED RT FOOT GREAT TOENAIL STILL HAVE FUNGUS FOR 4 YEARS AND IS NOT GOING AWAY. THE TOENAIL IS BEEN THE SAME. TRIED TWO BOTTLE FORMULA 3 BUT IS NOT HELPING.  Review of Systems  All other systems reviewed and are negative.      Objective:   Physical Exam 53 year old male well-developed well-nourished no pain however continues to have yellow thickening and white discoloration of the subungual area of the right great toe. Has been on Lamisil weeks for occasions through dermatologist has been on topical formula 3 for 2 years now is going to 2 or 3 bottle does not have significant improvement. Neurovascular status otherwise intact no other changes noted complications no significant health history he can cause the problems.      Assessment & Plan:  Assessment chronic onychomycosis however cannot rule out possible nail dystrophy from other causes injury trauma scar tissue eczema or dermatitis of some sort. Patient was issued other alternatives including we discussed laser treatment however at this time I think that the laser would not provide as much improvement as the possible use of a new topical antifungal Gregary Signs. At this time prescription for Almyra Free is given will be for directly to the patient's home patient will initiate daily application for 12 months duration as instructed reappointed 6-12 months for followup and reevaluation if no improvement consider the addition of laser treatment next  Harriet Masson DPM

## 2013-12-27 ENCOUNTER — Ambulatory Visit: Payer: Managed Care, Other (non HMO)

## 2014-05-22 ENCOUNTER — Telehealth: Payer: Self-pay | Admitting: Pulmonary Disease

## 2014-05-22 NOTE — Telephone Encounter (Signed)
Per SN---  Tell him that the dentist or oral surgeon are the ones that would treat this. But we can give a trial He will need ROV with SN In the meantime--do hot soaks Use a towel with hot water to the area for about 10-15 mins. otc anti-inflammatory  Aleve 2 po bid pred dosepak #1  Take as directed thanks

## 2014-05-22 NOTE — Telephone Encounter (Signed)
SN pt was last seen by you on 12/2012 and has no pending appts.  Pt wanted to know who he should see for TMJ flare.  Do you want him to make appt to see you>  Please advise. Thanks  Allergies  Allergen Reactions  . Codeine Itching    Patient denies    Current Outpatient Prescriptions on File Prior to Visit  Medication Sig Dispense Refill  . azithromycin (ZITHROMAX) 250 MG tablet     . diazepam (VALIUM) 5 MG tablet TAKE ONE TABLET BY MOUTH EVERY 8 HOURS AS NEEDED FOR ANXIETY 90 tablet 5  . Efinaconazole (JUBLIA) 10 % SOLN Apply 1 or 2 drops to the affected great toenail once daily for 12 months 8 mL 2  . ibuprofen (ADVIL,MOTRIN) 200 MG tablet Take 600 mg by mouth daily.    . valACYclovir (VALTREX) 1000 MG tablet      No current facility-administered medications on file prior to visit.

## 2014-05-22 NOTE — Telephone Encounter (Signed)
Spoke with pt- aware of rec's per SN Pt states that at this time he is not going to take the prednisone.  Pt states that he will give the Aleve BID and hot towels a try. If no improvement then we will call back for Prednisone and OV with SN. Pt states that he will decide depending on improvement whether or not he wants OV with SN or with Dentist/Oral Surgeon.  Nothing further needed.

## 2014-05-29 ENCOUNTER — Telehealth: Payer: Self-pay | Admitting: Pulmonary Disease

## 2014-05-29 NOTE — Telephone Encounter (Signed)
ATC NA WCB Called alternate # and LMTCB

## 2014-05-30 MED ORDER — PREDNISONE (PAK) 5 MG PO TABS: 5.0000 mg | ORAL_TABLET | ORAL | Status: DC

## 2014-05-30 NOTE — Telephone Encounter (Signed)
Pt returned call - [phone]

## 2014-05-30 NOTE — Telephone Encounter (Signed)
Called and spoke to pt. Pt is now requesting the pred dose pak that he refused last week. Rx sent to preferred pharmacy. OV made with SN on 06/02/14. Pt verbalized understanding and denied any further questions or concerns at this time.

## 2014-05-30 NOTE — Telephone Encounter (Signed)
Attempted to call pt. No answer, could not leave a message. Will try back.

## 2014-06-02 ENCOUNTER — Ambulatory Visit: Payer: Self-pay | Admitting: Pulmonary Disease

## 2014-06-03 ENCOUNTER — Ambulatory Visit (INDEPENDENT_AMBULATORY_CARE_PROVIDER_SITE_OTHER): Payer: BLUE CROSS/BLUE SHIELD | Admitting: Pulmonary Disease

## 2014-06-03 ENCOUNTER — Telehealth: Payer: Self-pay | Admitting: *Deleted

## 2014-06-03 ENCOUNTER — Encounter: Payer: Self-pay | Admitting: Pulmonary Disease

## 2014-06-03 VITALS — BP 136/90 | HR 105 | Temp 98.7°F | Ht 70.0 in | Wt 235.5 lb

## 2014-06-03 DIAGNOSIS — K111 Hypertrophy of salivary gland: Secondary | ICD-10-CM

## 2014-06-03 DIAGNOSIS — I10 Essential (primary) hypertension: Secondary | ICD-10-CM

## 2014-06-03 DIAGNOSIS — M26629 Arthralgia of temporomandibular joint, unspecified side: Secondary | ICD-10-CM

## 2014-06-03 DIAGNOSIS — J449 Chronic obstructive pulmonary disease, unspecified: Secondary | ICD-10-CM

## 2014-06-03 DIAGNOSIS — M2662 Arthralgia of temporomandibular joint: Secondary | ICD-10-CM

## 2014-06-03 MED ORDER — METOPROLOL SUCCINATE ER 50 MG PO TB24: 50.0000 mg | ORAL_TABLET | Freq: Every day | ORAL | Status: DC

## 2014-06-03 MED ORDER — EFINACONAZOLE 10 % EX SOLN: CUTANEOUS | Status: DC

## 2014-06-03 NOTE — Telephone Encounter (Signed)
Pt request refill of Jublia.  I informed pt the rx would need to be sent to a [facility] to receive a product discount.  Jublia sent to [facility] on [address].

## 2014-06-03 NOTE — Patient Instructions (Signed)
Today we updated your med list in our EPIC system...    Continue your current medications the same...  We started a BP medication> METOPROLOL-ER 50mg  take one tab daily...    Watch your BP at home & let me know how it is doing...    Remember- eliminate the salt/ sodium from your diet & keep up the good work w/ weight reduction...  For the Parotid glands and the ?TMJ problem>>    Finish the Prednisone dosepak...    Apply hot compresses when you note increased swelling or discomfort...    Keep the lozenges, cough drops, lemon drops handy to keep the saliva flowing...  The next step would be a referral to an ENT specialist or poss an Oral Surgeon...    Let me know how you are doing w/ this.Marland KitchenMarland Kitchen

## 2014-06-06 ENCOUNTER — Ambulatory Visit: Payer: Self-pay | Admitting: Pulmonary Disease

## 2014-06-09 NOTE — Progress Notes (Signed)
Subjective:    Patient ID: Richard Li, male    DOB: [birth year], 54 y.o.   MRN: [id number]  HPI 54 y/o WM here for a follow up visit & CPX...  ~  [name]:  he was a very heavy smoker and quit in 2007... he has moderate COPD but remarkably preserved PFT's in the past... he was on Symbicort but stopped this on his own...he complains of increased SOB in the heat, and after eating...  notes min cough,  scant sputum, no hemoptysis, no wheezing/ snoring/ daytime hypersomnolence, etc... he notes vague chest discomfort on & off- not always activity related... we discussed pursuing Pulm & Cardiac testing for completeness...  he takes Valium Qhs but wonders about increasing this med...  ~  December 11, 2009:  13yrROV & doing satis but still notes some SOB w/ activity in hot weather... [place] & the TV advertisment scared him... he notes mild cough, min sputum (clear), no CP/ palpit, etc... unfortunately he has gained weight from 203 to 230# at present... we discussed diet + exercise & weight reduction + sample of DULERA 100- 2sp Bid...  ~  January 26, 2010:  presents w/ 1568mox left chest wall pain- laterally in posterolat ribs, worse at night, not pleuritic, +sl tender, no known trauma... he states Vicodin w/o help... notes assoc cough & sm amt discolored sputum- advised use Dulera Bid + Mucinex Bid w/ fluids... CXR repeat> COPD, NAD & rib details neg; therefore discussed plan to Rx w/ rest/ heat/ binder/ PERCOET & Augmentin... he wants to incr VALIUM to 68m82mid- OK... if discomfort persists then consider bone scan/ neuro consult/ pain management ==> f/u note: symptoms finally resolved after one month off work & time to heal rib fxs...  ~  December 06, 2010:  27m70mo & [facility] & infection symptoms improved but he has persistent congestion, sinus drainage, headache, & cough> we discussed the need  for anti-inflamm therapy w/ Depo120, Pred 3dtaper, Mucinex, Fluids, etc...  He notes otherw stable but has gained 40# in the 29yrs7yrce he's quit smoking;  Breathing stable, denies recurrent CP, chronic stable DOE;  Still trying to control Lipids w/ diet alone but 40# wt gain wil,l shoot that theory all to pieces & he is asked to ret for FLP so we can determine if he needs meds...  ~  December 12, 2011:  Yearly ROV &Grady LarryMarquelhad a good yr, only Rx is Valium prn; no new complaints or concerns...    We reviewed prob list, meds, xrays and labs> see below for updates >> CXR 8/13 showed normal heart size, clear lungs, NAD... EKMarland KitchenMarland Kitchen8/13 showed NSR, rate70, wnl, NAD... LABS 8/13:  FLP- chol ok but TG=259;  Chems- wnl;  CBC- wnl;  TSH=2.11;  PSA=0.45;  UA- wnl   ~  December 25, 2012:  Yearly ROV & CPX> LarryKimarirts a good year overall- he fell in his yard on his right arm 1 wk ago w/ pain in the elbow area & we discussed hot soaks, Mobic15 & OV w/ XRays by [name] etal.Harriett [name] also reports seeing [name], Derm & started on Lamisil orally- needs LFTs checked & copy to Derm- OK... We reviewed the following medical problems during today's office visit >>     HxAR> he has seen [name] in the past w/ sinus turbinate  reduction in 2002; asked to avoid antihist due to parotid dis...    Parotid Hypertrophy> known bilat parotid swelling, no change & he denies dry mouth symptoms or sequelae, rec for sialogogues...     COPD> former very heavy smoker, quit 2007; see prev CXR, CTChest, PFTs below; notes min cough, no sput, no hemoptysis, denies SOB, etc...    Hyperlipid> on low fat diet alone but remains overweight & is up 9# this yr; last FLP 8/13 showed TChol 139, TG 259, HDL 35, LDL 58; rec for better low fat diet & get wt down.    Overweight> weight is up 9# to 246# this yr; BMI ~35 and we reviewed diet, exercise, & wt reduction strategies...    GI- GERD, needs screening colon> he uses OTC Prilosec prn; denies  dysphagia, abd pain, n/v, c/d, blood seen; needs screening colon...     GU- he's had prev vasectomy by [name]... Denies LTOS etc...    HxLBP> prev ortho eval by [name]... He uses OTC analgesics as needed...    Anxiety> on Valium5 as needed & requests refill...    Onychomycosis> he had eval by Payton Mccallum, [name], & was started on Lamisil orally,,,  We reviewed prob list, meds, xrays and labs> see below for updates >>   CXR 9/14 showed COPD, patchy opac RUL- ?inflamm, ?atx, ?other... We decided to treat w/ Mucinex, Fluids, & repeat CXR 57mo..  LABS 9/14 (not fasting today):  Chems- ok x Na=130, LFTs= wnl;  CBC- wnl;  TSH=2.38;  PSA=0.43...  ADDENDUM>> follow up CXR 10/14 showed resolution of prev RUL opac; norm heart size, mild hyperinflation, NAD...  ~  June 03, 2014:  133moOV & [name] c/o recent URI symptoms, bilat parotid gland hypertrophy, [name]> he notes URI about 2 wks ago & [facility] but he can't recall what & only sl better; then called here c/o TMJ pain & he was told Dentist or Oral surgeon required; we did agree to try hot soaks, Advil vs Aleve, & gave him a Pred dosepak- sl better; he is quite vague about his actual symptoms but he has long hx of bilat parotid hypertrophy & advised to use sialogogues, lozenges, warm compresses, etc (and he's been prev offered ENT consultation)... We reviewed the following medical problems during today's office visit >>     HxAR> he has seen [name] in the past w/ sinus turbinate reduction in 2002; asked to avoid antihist due to parotid dis...    Parotid Hypertrophy> known bilat parotid swelling, no change & he denies dry mouth symptoms or sequelae, rec for sialogogues, lozenges, warm compresses, f/u w/ ENT if necessary...     COPD> former very heavy smoker, quit 2007; see prev CXR, CTChest, PFTs below; notes min cough, no sput, no hemoptysis, denies SOB, etc...    Hyperlipid> on low fat diet alone but remains  overweight at 236# (BMI=34); last FLP 8/13 showed TChol 139, TG 259, HDL 35, LDL 58; rec for better low fat diet, get wt down, ret for another FLP.    Overweight> weight is 236# this yr; BMI ~34 and we reviewed diet, exercise, & wt reduction strategies...    GI- GERD, needs screening colon> he uses OTC Prilosec prn; denies dysphagia, abd pain, n/v, c/d, blood seen; he had screening colonoscopy 12/14 by DrPerry- NEG, wnl & rec to f/u 10 yrs.    GU- he's had prev vasectomy by [name]... Denies LTOS etc...[name]  HxLBP> prev ortho eval by [name]... He uses OTC analgesics as needed...    Anxiety> on Valium5 as needed & requests refill...    Onychomycosis> he had eval by Payton Mccallum, [name], & was prev rx w/ Lamisil; then he saw Podiarty DrSikora & treated w/ Jublia... We reviewed prob list, meds, xrays and labs> see below for updates >>  PLAN>> his symptoms are quite vague but he is concerned; rec to use the Pred dosepak as prescribed, warm compresses, sialogogues/ lozenges, Advil vs Aleve; rec f/u appt w/ ENT [name] for his opinion & pt will consider this; BP is elev and we decided to start treatment w/ MetopER50, no salt in diet, & follow up BP here...          Problem List:     Bilat PAROTID GLAND HYPERTROPHY >> known parotid hypertrophy, no nodules palpated and normal salivary secretions & dentition; we reviewed the rec for sialogogues such as lemon drops etc... ~  2/16: he is c/o his parotid hypertrophy but it seems the same on exam from rev evaluations; rec warm compressed, sialogogues/ lozenges, Advil/ Aleve, & we wrote for an empiric Pred dosepak trial; he will call ENT for their review...  CHEST PAIN, ATYPICAL (ICD-786.59) - no prev hx of cardiac problems... risk factors include: +FamHx, Smoking Hx, low HDL, male gender... we will screen w/ a Stress Myoview... he saw [name] 5/08- EKG w/ NSR, IRBBB, NSSTTWA... Myoview was done 8/09 & showed good exerc capacity, normal BP response, no  signif EKG changes, normal images w/o ischemia/ scar & EF= 58%... ~  EKG 8/11 shows NSR, WNL.[name]. ~  8/13:  Denies CP, palpit, SOB, edema; BP= 150/80 after rest; exercise= yard work; he knows to avoid salt & get wt down...  ALLERGIC RHINITIS (ICD-477.9) - uses OTC meds as needed... prev ENT surgery from [facility] in 2002 w/ turbinate reduction... he has prominent parotid hypertrophy bilat==> & we discussed Rx w/ sialogogues etc...  COPD (ICD-496) - former heavy smoker and quit in 2007... chronic bronchitic symptoms... prev on Symbicort but he stopped this on his own & refuses Spiriva due to side effects discussed on TV ads...  ABNORMAL CHEST XRAY (ICD-793.1) - prev CXR's w/ hyperinflation c/w COPD, 1cm RLL density without change- likely scarring... 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  ~  baseline CXR shows hyperinflation, some scarring, NAD... ~  PFT's 7/09 showed FVC= 6.61 (135%), FEV1=3.88 (97%), %1sec=59, mid-flows= 44%. ~  CXR 8/11 showed COPD w/ hyperaeration, min scarring, NAD> confirmed 10/11 & Left Rib details= neg. ~  8/11:  started on DULERA 100-5 2spBid==> he stopped "I don't like meds" ~  CXR 8/13 showed normal heart size, clear lungs, NAD..[name] ~  CXR 9/14 showed COPD, patchy opac RUL- ?inflamm, ?atx, ?other... We decided to treat w/ Mucinex, Fluids, & repeat CXR 167mo f/u film showed resolution of RUL opacity...  HYPERLIPIDEMIA (ICD-272.4) - on diet therapy alone w/ incr TG assoc w/ recent weight gain... discussed diet + exercise. ~  FLP 8/09 (wt=203#) showed TChol 139, TG 116, HDL 35, LDL 81 ~  FLP 8/11 (wt=230#) showed TChol 145, TG 247, HDL 33, LDL 68... advised diet + exercise, get wt down. ~  FLP 8/13 (wt=237#) showed TChol 139, TG 259, HDL 35, LDL 58 ~  9/14: pt not fasting for f/u FLP- he's gained 9# & we reviewed need  for  low carb, low fat diet & wt reduction...  OBESITY >> we reviewed diet recommendations, exercise prescription, & wt reduction strategies.. ~  Weight 8/13 = 237#, BMI= 34 ~  Weight 9/14 = 246# ~  Weight 2/16 = 236#  GERD (ICD-530.81) - on PRILOSEC 63m Prn for reflux symptoms... ~  Screening colonoscopy 12/14 by DrPerry was NEG- wnl & f/u rec in 10 yrs...  GU> He had Vasectomy from [name] in Jan2012...  BACK PAIN, LUMBAR (ICD-724.2) - prev eval from [name]...  ANXIETY (ICD-300.00) - on VALIUM 570mas needed... he states Xanax is too strong...  Health Maintenance - [place]ed rectal fissure otherw neg...   Current Medications, Allergies, Past Medical History, Past Surgical History, Family History, and Social History were reviewed in [employer].     Past Surgical History  Procedure Laterality Date  . Appendectomy    . Nasal turbinate reduction  2002    Dr.Crossley  . Vasectomy    . Colonoscopy  15 years ago    Outpatient Encounter Prescriptions as of 06/03/2014  Medication Sig  . Efinaconazole (JUBLIA) 10 % SOLN Apply 1 or 2 drops to the affected great toenail once daily for 12 months  . ibuprofen (ADVIL,MOTRIN) 200 MG tablet Take 600 mg by mouth daily.  . predniSONE (STERAPRED UNI-PAK) 5 MG TABS tablet Take 1 tablet (5 mg total) by mouth as directed.  . metoprolol succinate (TOPROL-XL) 50 MG 24 hr tablet Take 1 tablet (50 mg total) by mouth daily. Take with or immediately following a meal.  . valACYclovir (VALTREX) 1000 MG tablet   . [DISCONTINUED] azithromycin (ZITHROMAX) 250 MG tablet   . [DISCONTINUED] diazepam (VALIUM) 5 MG tablet TAKE ONE TABLET BY MOUTH EVERY 8 HOURS AS NEEDED FOR ANXIETY (Patient not taking: Reported on 06/03/2014)    Allergies  Allergen Reactions  . Codeine Itching    Patient denies    Current Medications, Allergies, Past Medical History, Past Surgical History, Family History, and Social History were  reviewed in [employer].    Review of Systems        See HPI - all other systems neg except as noted...       The patient complains of chest pain and dyspnea on exertion.  The patient denies anorexia, fever, weight loss, weight gain, vision loss, decreased hearing, hoarseness, syncope, peripheral edema, prolonged cough, headaches, hemoptysis, abdominal pain, melena, hematochezia, severe indigestion/heartburn, hematuria, incontinence, muscle weakness, suspicious skin lesions, transient blindness, difficulty walking, depression, unusual weight change, abnormal bleeding, enlarged lymph nodes, and angioedema.     Objective:   Physical Exam      WD, WN, 5363/o WM in NAD... GENERAL:  Alert & oriented; pleasant & cooperative... He has very large symmetric bilat parotid glands, no nodules or tenderness... HEENT:  [place]/AT, EOM-wnl, PERRLA,  EACs-clear, TMs-wnl, NOSE- turbinates boggy, no drainage; THROAT-clear & wnl;  NECK:  Supple w/ fairROM; no JVD; normal carotid impulses w/o bruits; no thyromegaly or nodules palpated; no lymphadenopathy. CHEST:  Decr BS bilat but clear to P & A; without wheezes/ rales/ or rhonchi heard...  HEART:  Regular Rhythm; without murmurs/ rubs/ or gallops detected... ABDOMEN:  Soft & nontender; normal bowel sounds; no organomegaly or masses palpated... EXT: without deformities, mild arthritic changes; no varicose veins/ venous insuffic/ or edema. NEURO:  CN's intact; motor testing normal; sensory testing normal; gait normal & balance OK. DERM:  No lesions noted; no rash, several tatoos...Marland KitchenMarland Kitchen  RADIOLOGY DATA:  Reviewed in the EPIC EMR & discussed w/ the patient...  LABORATORY DATA:  Reviewed in the EPIC EMR & discussed w/ the patient...   Assessment & Plan:    Hx AR, Hx sinusitis, bilat parotid hypertrophy>  Prev treated sinus w/ Depo, Pred, Levaquin, Mucinex, etc... rec to avoid antihist, use sialogogues etc...  COPD, ex-smoker, Hx Abn  CXR>  He refuses the [facility] etc because he doesn't like meds and doesn't think he needs them; ?RUL opac- Rx mucinex fluids, f/u CXR=> resolved...  HYPERLIPIDEMIA>  He has endeavored to control this w/ diet alone, unfortunately he has gained wt since stopping the smoking;  Needs f/u FLP & consideration of med rx...  GERD>  He continues on Prilosec as needed...  LBP>  Stable w/ prev eval from [name]...  Anxiety>  He uses Valium 67m prn...   Patient's Medications  New Prescriptions   METOPROLOL SUCCINATE (TOPROL-XL) 50 MG 24 HR TABLET    Take 1 tablet (50 mg total) by mouth daily. Take with or immediately following a meal.  Previous Medications   EFINACONAZOLE (JUBLIA) 10 % SOLN    Apply 1 or 2 drops to the affected great toenail once daily for 12 months   IBUPROFEN (ADVIL,MOTRIN) 200 MG TABLET    Take 600 mg by mouth daily.   PREDNISONE (STERAPRED UNI-PAK) 5 MG TABS TABLET    Take 1 tablet (5 mg total) by mouth as directed.   VALACYCLOVIR (VALTREX) 1000 MG TABLET      Modified Medications   No medications on file  Discontinued Medications   AZITHROMYCIN (ZITHROMAX) 250 MG TABLET       DIAZEPAM (VALIUM) 5 MG TABLET    TAKE ONE TABLET BY MOUTH EVERY 8 HOURS AS NEEDED FOR ANXIETY

## 2015-03-30 ENCOUNTER — Ambulatory Visit (INDEPENDENT_AMBULATORY_CARE_PROVIDER_SITE_OTHER): Payer: BLUE CROSS/BLUE SHIELD | Admitting: Pulmonary Disease

## 2015-03-30 ENCOUNTER — Ambulatory Visit (INDEPENDENT_AMBULATORY_CARE_PROVIDER_SITE_OTHER)
Admission: RE | Admit: 2015-03-30 | Discharge: 2015-03-30 | Disposition: A | Discharge status: Home / Self Care | Payer: BLUE CROSS/BLUE SHIELD | Source: Ambulatory Visit | Attending: Pulmonary Disease | Admitting: Pulmonary Disease

## 2015-03-30 ENCOUNTER — Other Ambulatory Visit (INDEPENDENT_AMBULATORY_CARE_PROVIDER_SITE_OTHER): Payer: BLUE CROSS/BLUE SHIELD

## 2015-03-30 ENCOUNTER — Encounter: Payer: Self-pay | Admitting: Pulmonary Disease

## 2015-03-30 VITALS — BP 140/70 | HR 74 | Temp 98.4°F | Wt 236.4 lb

## 2015-03-30 DIAGNOSIS — Z0000 Encounter for general adult medical examination without abnormal findings: Secondary | ICD-10-CM | POA: Diagnosis not present

## 2015-03-30 DIAGNOSIS — M26623 Arthralgia of bilateral temporomandibular joint: Secondary | ICD-10-CM

## 2015-03-30 DIAGNOSIS — R7989 Other specified abnormal findings of blood chemistry: Secondary | ICD-10-CM | POA: Diagnosis not present

## 2015-03-30 DIAGNOSIS — E785 Hyperlipidemia, unspecified: Secondary | ICD-10-CM | POA: Diagnosis not present

## 2015-03-30 DIAGNOSIS — F411 Generalized anxiety disorder: Secondary | ICD-10-CM | POA: Diagnosis not present

## 2015-03-30 DIAGNOSIS — J449 Chronic obstructive pulmonary disease, unspecified: Secondary | ICD-10-CM

## 2015-03-30 DIAGNOSIS — Z23 Encounter for immunization: Secondary | ICD-10-CM

## 2015-03-30 DIAGNOSIS — K111 Hypertrophy of salivary gland: Secondary | ICD-10-CM | POA: Diagnosis not present

## 2015-03-30 DIAGNOSIS — R0602 Shortness of breath: Secondary | ICD-10-CM

## 2015-03-30 DIAGNOSIS — J4489 Other specified chronic obstructive pulmonary disease: Secondary | ICD-10-CM

## 2015-03-30 LAB — CBC WITH DIFFERENTIAL/PLATELET
Basophils Absolute: 0 10*3/uL (ref 0.0–0.1)
Basophils Relative: 0.6 % (ref 0.0–3.0)
Eosinophils Absolute: 0.1 10*3/uL (ref 0.0–0.7)
Eosinophils Relative: 1.2 % (ref 0.0–5.0)
HCT: 48 % (ref 39.0–52.0)
Hemoglobin: 16.5 g/dL (ref 13.0–17.0)
Lymphocytes Relative: 29.3 % (ref 12.0–46.0)
Lymphs Abs: 2.2 10*3/uL (ref 0.7–4.0)
MCHC: 34.3 g/dL (ref 30.0–36.0)
MCV: 92.4 fl (ref 78.0–100.0)
Monocytes Absolute: 0.7 10*3/uL (ref 0.1–1.0)
Monocytes Relative: 9.7 % (ref 3.0–12.0)
Neutro Abs: 4.4 10*3/uL (ref 1.4–7.7)
Neutrophils Relative %: 59.2 % (ref 43.0–77.0)
Platelets: 242 10*3/uL (ref 150.0–400.0)
RBC: 5.19 Mil/uL (ref 4.22–5.81)
RDW: 13.4 % (ref 11.5–15.5)
WBC: 7.4 10*3/uL (ref 4.0–10.5)

## 2015-03-30 LAB — BASIC METABOLIC PANEL
BUN: 11 mg/dL (ref 6–23)
CO2: 28 mEq/L (ref 19–32)
Calcium: 9 mg/dL (ref 8.4–10.5)
Chloride: 101 mEq/L (ref 96–112)
Creatinine, Ser: 0.84 mg/dL (ref 0.40–1.50)
GFR: 101.09 mL/min (ref >60.00)
Glucose, Bld: 97 mg/dL (ref 70–99)
Potassium: 4.5 mEq/L (ref 3.5–5.1)
Sodium: 135 mEq/L (ref 135–145)

## 2015-03-30 LAB — HEPATIC FUNCTION PANEL
ALT: 28 U/L (ref 0–53)
AST: 23 U/L (ref 0–37)
Albumin: 4.3 g/dL (ref 3.5–5.2)
Alkaline Phosphatase: 103 U/L (ref 39–117)
Bilirubin, Direct: 0.2 mg/dL (ref 0.0–0.3)
Total Bilirubin: 1 mg/dL (ref 0.2–1.2)
Total Protein: 6.9 g/dL (ref 6.0–8.3)

## 2015-03-30 LAB — LIPID PANEL
Cholesterol: 141 mg/dL (ref 0–200)
HDL: 31.3 mg/dL — ABNORMAL LOW (ref >39.00)
NonHDL: 109.45
Total CHOL/HDL Ratio: 4
Triglycerides: 296 mg/dL — ABNORMAL HIGH (ref 0.0–149.0)
VLDL: 59.2 mg/dL — ABNORMAL HIGH (ref 0.0–40.0)

## 2015-03-30 LAB — PSA: PSA: 0.46 ng/mL (ref 0.10–4.00)

## 2015-03-30 LAB — LDL CHOLESTEROL, DIRECT: Direct LDL: 58 mg/dL

## 2015-03-30 LAB — TSH: TSH: 2.02 u[IU]/mL (ref 0.35–4.50)

## 2015-03-30 IMAGING — DX DG CHEST 2V
2 series · 2 of 2 positions shown · non-contrast
Comparison: [DATE].

CLINICAL DATA: Shortness breath.

EXAM:
CHEST  2 VIEW

[chest lat]
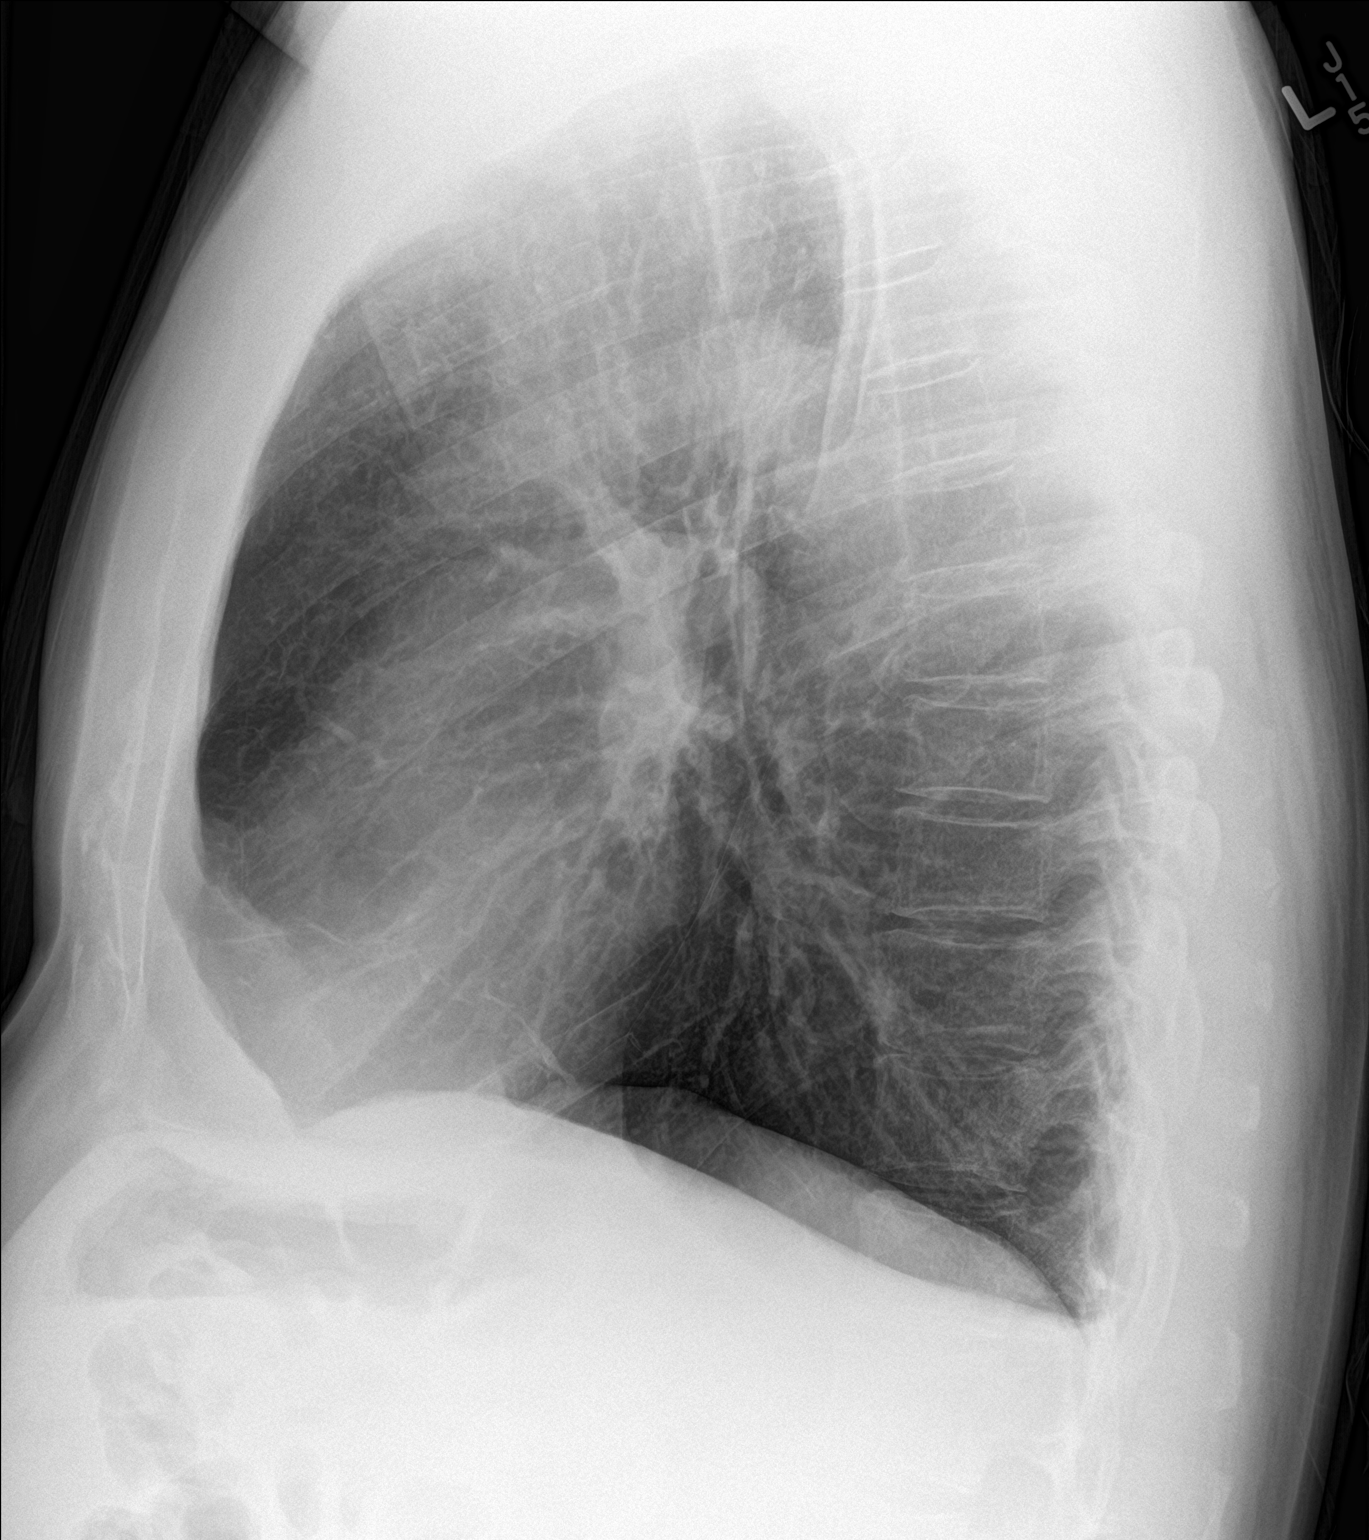

[chest pa]
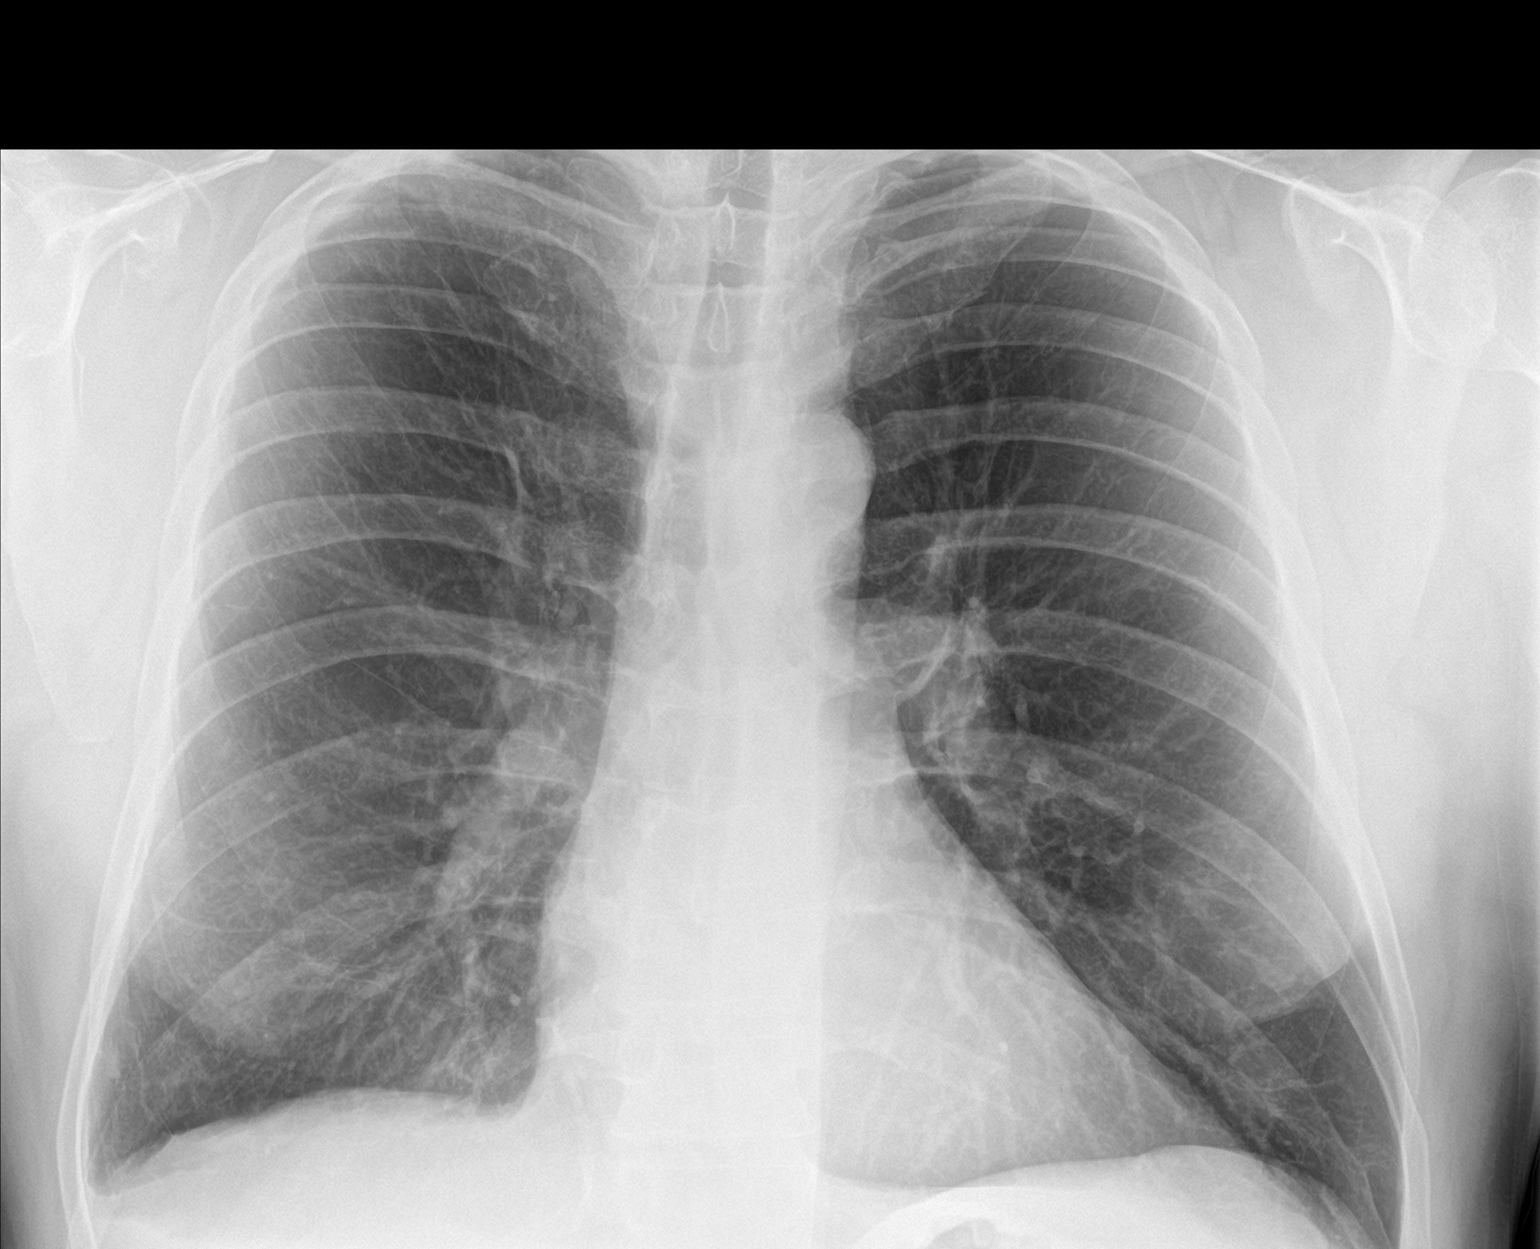

[2 of 2 positions shown; findings below may reference images not displayed]

FINDINGS: Mediastinum and hilar structures normal. Lungs are clear. Heart size
normal. No pleural effusion or pneumothorax. Right base pleural
parenchymal thickening consistent with scarring . No acute bony
abnormality .
IMPRESSION: No active cardiopulmonary disease.

## 2015-03-30 MED ORDER — ALBUTEROL SULFATE HFA 108 (90 BASE) MCG/ACT IN AERS: 1.0000 | INHALATION_SPRAY | Freq: Four times a day (QID) | RESPIRATORY_TRACT | Status: DC | PRN

## 2015-03-30 MED ORDER — AMLODIPINE BESYLATE 5 MG PO TABS: 5.0000 mg | ORAL_TABLET | Freq: Every day | ORAL | Status: DC

## 2015-03-30 MED ORDER — LORAZEPAM 1 MG PO TABS: 1.0000 mg | ORAL_TABLET | Freq: Three times a day (TID) | ORAL | Status: DC | PRN

## 2015-03-30 NOTE — Patient Instructions (Signed)
Today we updated your med list in our EPIC system...    Continue your current medications the same...  We decided to try a diff BP med> AMLODIPINE '5mg'$  one tab daily...    Monitor your BP at home & notify me of any problems...  Today we did a Spirometry tracing, CXR & your FASTING blood work...    We will contact you w/ the results when available...   Remember to use the "sialogogues" like lozenges, throat drops, lemon drops     (anything that gets the saliva to flow)  You may also try the BIOTENE products if your mouth stays dry...  Call for any questions.Marland KitchenMarland Kitchen

## 2015-03-30 NOTE — Progress Notes (Signed)
Subjective:    Patient ID: Richard Li, male    DOB: Aug 02, 1960, 54 y.o.   MRN: [id number]  HPI 54 y/o WM here for a follow up visit & CPX...  ~  [name]:  he was a very heavy smoker and quit in 2007... he has moderate COPD but remarkably preserved PFT's in the past... he was on Symbicort but stopped this on his own...he complains of increased SOB in the heat, and after eating...  notes min cough,  scant sputum, no hemoptysis, no wheezing/ snoring/ daytime hypersomnolence, etc... he notes vague chest discomfort on & off- not always activity related... we discussed pursuing Pulm & Cardiac testing for completeness...  he takes Valium Qhs but wonders about increasing this med...  ~  December 11, 2009:  72yr ROV & doing satis but still notes some SOB w/ activity in hot weather... he's tried [place] but he states that he didn't like them & the TV advertisment scared him... he notes mild cough, min sputum (clear), no CP/ palpit, etc... unfortunately he has gained weight from 203 to 230# at present... we discussed diet + exercise & weight reduction + sample of DULERA 100- 2sp Bid...  ~  January 26, 2010:  presents w/ 23mo hx left chest wall pain- laterally in posterolat ribs, worse at night, not pleuritic, +sl tender, no known trauma... he states Vicodin w/o help... notes assoc cough & sm amt discolored sputum- advised use Dulera Bid + Mucinex Bid w/ fluids... CXR repeat> COPD, NAD & rib details neg; therefore discussed plan to Rx w/ rest/ heat/ binder/ PERCOET & Augmentin... he wants to incr VALIUM to $RemoveB'5mg'IKyFscSx$  Tid- OK... if discomfort persists then consider bone scan/ neuro consult/ pain management ==> f/u note: symptoms finally resolved after one month off work & time to heal rib fxs...  ~  December 06, 2010:  1mo ROV & he reports a visit to [facility] last wk for sinus infection given Levaquin & infection symptoms improved but he has persistent congestion, sinus drainage, headache, & cough> we discussed the need  for anti-inflamm therapy w/ Depo120, Pred 3dtaper, Mucinex, Fluids, etc...  He notes otherw stable but has gained 40# in the 48yrs since he's quit smoking;  Breathing stable, denies recurrent CP, chronic stable DOE;  Still trying to control Lipids w/ diet alone but 40# wt gain wil,l shoot that theory all to pieces & he is asked to ret for FLP so we can determine if he needs meds...  ~  December 12, 2011:  Yearly [place] CPX> [place] has had a good yr, only Rx is Valium prn; no new complaints or concerns...    We reviewed prob list, meds, xrays and labs> see below for updates >> CXR 8/13 showed normal heart size, clear lungs, NAD.Marland KitchenMarland Kitchen EKG 8/13 showed NSR, rate70, wnl, NAD... LABS 8/13:  FLP- chol ok but TG=259;  Chems- wnl;  CBC- wnl;  TSH=2.11;  PSA=0.45;  UA- wnl   ~  December 25, 2012:  Yearly ROV & CPX> [name] reports a good year overall- he fell in his yard on his right arm 1 wk ago w/ pain in the elbow area & we discussed hot soaks, Mobic15 & OV w/ XRays by [name] Harriett Sine... He also reports seeing [name], Derm & started on Lamisil orally- needs LFTs checked & copy to Derm- OK... We reviewed the following medical problems during today's office visit >>     HxAR> he has seen [name] in the past w/ sinus turbinate  reduction in 2002; asked to avoid antihist due to parotid dis...    Parotid Hypertrophy> known bilat parotid swelling, no change & he denies dry mouth symptoms or sequelae, rec for sialogogues...     COPD> former very heavy smoker, quit 2007; see prev CXR, CTChest, PFTs below; notes min cough, no sput, no hemoptysis, denies SOB, etc...    Hyperlipid> on low fat diet alone but remains overweight & is up 9# this yr; last FLP 8/13 showed TChol 139, TG 259, HDL 35, LDL 58; rec for better low fat diet & get wt down.    Overweight> weight is up 9# to 246# this yr; BMI ~35 and we reviewed diet, exercise, & wt reduction strategies...    GI- GERD, needs screening colon> he uses OTC Prilosec prn; denies  dysphagia, abd pain, n/v, c/d, blood seen; needs screening colon...     GU- he's had prev vasectomy by [name]... Denies LTOS etc...    HxLBP> prev ortho eval by [name]... He uses OTC analgesics as needed...    Anxiety> on Valium5 as needed & requests refill...    Onychomycosis> he had eval by Payton Mccallum, [name], & was started on Lamisil orally,,,  We reviewed prob list, meds, xrays and labs> see below for updates >>   CXR 9/14 showed COPD, patchy opac RUL- ?inflamm, ?atx, ?other... We decided to treat w/ Mucinex, Fluids, & repeat CXR 57mo..  LABS 9/14 (not fasting today):  Chems- ok x Na=130, LFTs= wnl;  CBC- wnl;  TSH=2.38;  PSA=0.43...  ADDENDUM>> follow up CXR 10/14 showed resolution of prev RUL opac; norm heart size, mild hyperinflation, NAD...  ~  June 03, 2014:  89moOV & [name] c/o recent URI symptoms, bilat parotid gland hypertrophy, [name]> he notes URI about 2 wks ago & [facility] but he can't recall what & only sl better; then called here c/o TMJ pain & he was told Dentist or Oral surgeon required; we did agree to try hot soaks, Advil vs Aleve, & gave him a Pred dosepak- sl better; he is quite vague about his actual symptoms but he has long hx of bilat parotid hypertrophy & advised to use sialogogues, lozenges, warm compresses, etc (and he's been prev offered ENT consultation)... We reviewed the following medical problems during today's office visit >>     HxAR> he has seen [name] in the past w/ sinus turbinate reduction in 2002; asked to avoid antihist due to parotid dis...    Parotid Hypertrophy> known bilat parotid swelling, no change & he denies dry mouth symptoms or sequelae, rec for sialogogues, lozenges, warm compresses, f/u w/ ENT if necessary...     COPD> former very heavy smoker, quit 2007; see prev CXR, CTChest, PFTs below; notes min cough, no sput, no hemoptysis, denies SOB, etc...    Hyperlipid> on low fat diet alone but remains  overweight at 236# (BMI=34); last FLP 8/13 showed TChol 139, TG 259, HDL 35, LDL 58; rec for better low fat diet, get wt down, ret for another FLP.    Overweight> weight is 236# this yr; BMI ~34 and we reviewed diet, exercise, & wt reduction strategies...    GI- GERD, needs screening colon> he uses OTC Prilosec prn; denies dysphagia, abd pain, n/v, c/d, blood seen; he had screening colonoscopy 12/14 by DrPerry- NEG, wnl & rec to f/u 10 yrs.    GU- he's had prev vasectomy by [name]... Denies LTOS etc...[name]  HxLBP> prev ortho eval by [name]... He uses OTC analgesics as needed...    Anxiety> on Valium5 as needed & requests refill...    Onychomycosis> he had eval by Payton Mccallum, [name], & was prev rx w/ Lamisil; then he saw Podiarty DrSikora & treated w/ Jublia... We reviewed prob list, meds, xrays and labs> see below for updates >>  PLAN>> his symptoms are quite vague but he is concerned; rec to use the Pred dosepak as prescribed, warm compresses, sialogogues/ lozenges, Advil vs Aleve; rec f/u appt w/ ENT [name] for his opinion & pt will consider this; BP is elev and we decided to start treatment w/ MetopER50, no salt in diet, & follow up BP here...  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 we reviewed the following medical problems during today's office visit >>    HxAR> he has seen [name] in the past w/ sinus turbinate reduction in 2002; asked to avoid antihist due to parotid dis, ok to use nasal Saline, OTC Flonase, etc...    Parotid Hypertrophy> known bilat parotid swelling, no change & he notes some dry mouth symptoms or sequelae, rec for  sialogogues, Biotene, lozenges, warm compresses, f/u w/ ENT if necessary...     COPD> former heavy smoker, quit 2007; see prev CXR, CTChest, PFTs below; notes min cough, no sput, no hemoptysis, but says incr SOB "like I can't get a DB, not satisfied breathing"=> rec to try PROAIR prn & ATIVAN $Remove'1mg'rOvXpMX$  prn.    HBP>  As above- we discussed lifestyle mod w/ diet/ exercise/ wt reduction & decided to place him on Amlodipine $RemoveBefor'5mg'iqqtvNEmIGrQ$ /d w/ careful f/u BPs at home & here...    Hyperlipid> on low fat diet alone but remains overweight at 236# (BMI=34); FLP 12/16 showed TChol 141, TG 296, HDL 31, LDL 58; rec for better low fat diet, get wt down in order to improve the TG & HDL.    Overweight> weight is 236# this yr; BMI ~34 and we reviewed diet, exercise, & wt reduction strategies...    GI- GERD, needs screening colon> he uses OTC Prilosec prn; denies dysphagia, abd pain, n/v, c/d, blood seen; he had screening colonoscopy 12/14 by DrPerry- NEG, wnl & rec to f/u 10 yrs.    GU- he's had prev vasectomy by [name]... Denies LTOS etc...    HxLBP> prev ortho eval by [name]... He uses OTC analgesics as needed...    Anxiety> prev on Valium5 as needed in past; we discussed use of Lorazepam $RemoveBefo'1mg'xPQrlCZJdfN$ - 1/2 to 1 tab Tid as needed for dyspnea & anxiety...     Onychomycosis> he had eval by Payton Mccallum, [name], & was prev rx w/ Lamisil; then he saw Podiarty DrSikora & treated w/ Jublia... EXAM shows Afeb, VSS, O2sat=98% on RA,  HEENT- marked parotid hypertrophy, mallamapati2;  Chest- clear w/o w/r/r;  Heart- RR w/o m/r/g;  Abd- neg, nontender, no masses;  Ext- neg w/o c/c/e;  Neuro- neg w/o focal abn...   CXR 03/30/15>  Norm heart size, lungs are clear x some scarring at right base, NAD.Marland KitchenMarland Kitchen  Spirometry 03/30/15>  FVC=5.19 (107%), FEV1=3.22 (84%), %1sec=62, mid-flows reduced at 41% predicted... This is c/w mild-mod airflow obstruction & we discussed PROAIR-HFA for prn use...  LABS 03/30/15>  FLP- Chol ok but TG elev;  Chems- wnl;  CBC- wnl;   TSH=2.02;  PSA=0.46 IMP/PLAN>>  Rollins returns for his annual CPX w/ report of elevBP & needs Rx for DOT- discussed lifestyle mod w/ DIET/ EXERCISE/ wt reduction, and we are starting low dose Amlodipine $RemoveBeforeD'5mg'rRrMjZoAyNnRPz$ /d; he will monitor BP at home & call for issues  In addition his Hyperlipidemia is unchanged, so is his weight etc- needs wt reduction & a low fat diet to effect improvement in these numbers;  Almost as an afterthought he noted SOB/DOE that seems to take 2 forms: 1) SOB w/ activity & wheezing noted- Rx w/ ProairHFA rescue inhaler 2sp Q6H as needed;  2) SOB at rest "like I can't get a DB, trouble getting the air "IN"- we discussed Rx w/ Lorazepam $RemoveBefo'1mg'BqHGWGbaOuY$ - 1/2 to 1 tab Tid prn...           Problem List:     Bilat PAROTID GLAND HYPERTROPHY >> known parotid hypertrophy, no nodules palpated and normal salivary secretions & dentition; we reviewed the rec for sialogogues such as lemon drops etc... ~  2/16: he is c/o his parotid hypertrophy but it seems the same on exam from rev evaluations; rec warm compressed, sialogogues/ lozenges, Advil/ Aleve, & we wrote for an empiric Pred dosepak trial; he will call ENT for their review...  CHEST PAIN, ATYPICAL (ICD-786.59) - no prev hx of cardiac problems... risk factors include: +FamHx, Smoking Hx, low HDL, male gender... we will screen w/ a Stress Myoview... he saw [name] 5/08- EKG w/ NSR, IRBBB, NSSTTWA... Myoview was done 8/09 & showed good exerc capacity, normal BP response, no signif EKG changes, normal images w/o ischemia/ scar & EF= 58%... ~  EKG 8/11 shows NSR, WNL.[name]. ~  8/13:  Denies CP, palpit, SOB, edema; BP= 150/80 after rest; exercise= yard work; he knows to avoid salt & get wt down...  ALLERGIC RHINITIS (ICD-477.9) - uses OTC meds as needed... prev ENT surgery from [place] in 2002 w/ turbinate reduction... he has prominent parotid hypertrophy bilat==> & we discussed Rx w/ sialogogues etc...  COPD (ICD-496) - former heavy smoker and quit in 2007...  chronic bronchitic symptoms... prev on Symbicort but he stopped this on his own & refuses Spiriva due to side effects discussed on TV ads...  ABNORMAL CHEST XRAY (ICD-793.1) - prev CXR's w/ hyperinflation c/w COPD, 1cm RLL density without change- likely scarring... ~  CTChest 11/07 showed hyperinflation w/ emphysematous changes in the upper lobes, 18mm RML nodule without change, several other scattered sm nodules, pleural thickening, and ca++ in Abd Ao...  ~  baseline CXR shows hyperinflation, some scarring, NAD... ~  PFT's 7/09 showed FVC= 6.61 (135%), FEV1=3.88 (97%), %1sec=59, mid-flows= 44%. ~  CXR 8/11 showed COPD w/ hyperaeration, min scarring, NAD> confirmed 10/11 & Left Rib details= neg. ~  8/11:  started on DULERA 100-5 2spBid==> he stopped "I don't like meds" ~  CXR 8/13 showed normal heart size, clear lungs, NAD.[name]. ~  CXR 9/14 showed COPD, patchy opac RUL- ?inflamm, ?atx, ?other... We decided to treat w/ Mucinex, Fluids, & repeat CXR 66mo=> f/u film showed resolution of RUL opacity...  HYPERLIPIDEMIA (ICD-272.4) - on diet therapy alone w/ incr TG assoc w/ recent weight gain... discussed diet + exercise. ~  FLP 8/09 (wt=203#) showed TChol 139, TG 116, HDL  35, LDL 81 ~  FLP 8/11 (wt=230#) showed TChol 145, TG 247, HDL 33, LDL 68... advised diet + exercise, get wt down. ~  FLP 8/13 (wt=237#) showed TChol 139, TG 259, HDL 35, LDL 58 ~  9/14: pt not fasting for f/u FLP- he's gained 9# & we reviewed need for low carb, low fat diet & wt reduction...  OBESITY >> we reviewed diet recommendations, exercise prescription, & wt reduction strategies.. ~  Weight 8/13 = 237#, BMI= 34 ~  Weight 9/14 = 246# ~  Weight 2/16 = 236#  GERD (ICD-530.81) - on PRILOSEC $RemoveBef'20mg'NqUCNrfsMe$  Prn for reflux symptoms... ~  Screening colonoscopy 12/14 by DrPerry was NEG- wnl & f/u rec in 10 yrs...  GU> He had Vasectomy from [name] in Jan2012...  BACK PAIN, LUMBAR (ICD-724.2) - prev eval from [name]...  ANXIETY  (ICD-300.00) - on VALIUM $RemoveB'5mg'aVIRtgqI$  as needed... he states Xanax is too strong...  Health Maintenance - [place] showed rectal fissure otherw neg...   Current Medications, Allergies, Past Medical History, Past Surgical History, Family History, and Social History were reviewed in [employer] record.     Past Surgical History  Procedure Laterality Date  . Appendectomy    . Nasal turbinate reduction  2002    Dr.Crossley  . Vasectomy    . Colonoscopy  15 years ago    Outpatient Encounter Prescriptions as of 03/30/2015  Medication Sig  . ibuprofen (ADVIL,MOTRIN) 200 MG tablet Take 600 mg by mouth daily.  . valACYclovir (VALTREX) 1000 MG tablet   . [DISCONTINUED] predniSONE (STERAPRED UNI-PAK) 5 MG TABS tablet Take 1 tablet (5 mg total) by mouth as directed.  [name] albuterol (PROAIR HFA) 108 (90 BASE) MCG/ACT inhaler Inhale 1-2 puffs into the lungs every 6 (six) hours as needed for wheezing or shortness of breath.  . Efinaconazole (JUBLIA) 10 % SOLN Apply 1 or 2 drops to the affected great toenail once daily for 12 months (Patient not taking: Reported on 03/30/2015)  . metoprolol succinate (TOPROL-XL) 50 MG 24 hr tablet Take 1 tablet (50 mg total) by mouth daily. Take with or immediately following a meal. (Patient not taking: reported on 03/30/2015)   No facility-administered encounter medications on file as of 03/30/2015.    Allergies  Allergen Reactions  . Codeine Itching    Patient denies    Current Medications, Allergies, Past Medical History, Past Surgical History, Family History, and Social History were reviewed in [employer] record.    Review of Systems        See HPI - all other systems neg except as noted...       The patient complains of chest pain and dyspnea on exertion.  The patient denies anorexia, fever, weight loss, weight gain, vision loss, decreased hearing, hoarseness, syncope, peripheral edema, prolonged cough, headaches,  hemoptysis, abdominal pain, melena, hematochezia, severe indigestion/heartburn, hematuria, incontinence, muscle weakness, suspicious skin lesions, transient blindness, difficulty walking, depression, unusual weight change, abnormal bleeding, enlarged lymph nodes, and angioedema.     Objective:   Physical Exam      WD, WN, 54 y/o WM in NAD... GENERAL:  Alert & oriented; pleasant & cooperative... He has very large symmetric bilat parotid glands, no nodules or tenderness... HEENT:  [place]/AT, EOM-wnl, PERRLA,  EACs-clear, TMs-wnl, NOSE- turbinates boggy, no drainage; THROAT-clear & wnl;  NECK:  Supple w/ fairROM; no JVD; normal carotid impulses w/o bruits; no thyromegaly or nodules palpated; no lymphadenopathy. CHEST:  Decr BS bilat but clear to  P & A; without wheezes/ rales/ or rhonchi heard...  HEART:  Regular Rhythm; without murmurs/ rubs/ or gallops detected... ABDOMEN:  Soft & nontender; normal bowel sounds; no organomegaly or masses palpated... EXT: without deformities, mild arthritic changes; no varicose veins/ venous insuffic/ or edema. NEURO:  CN's intact; motor testing normal; sensory testing normal; gait normal & balance OK. DERM:  No lesions noted; no rash, several tatoos...  RADIOLOGY DATA:  Reviewed in the EPIC EMR & discussed w/ the patient...  LABORATORY DATA:  Reviewed in the EPIC EMR & discussed w/ the patient...   Assessment & Plan:    COPD, ex-smoker, Hx Abn CXR>  He refuses the [facility] etc because he doesn't like meds and doesn't think he needs them; ?RUL opac- Rx mucinex fluids, f/u CXR=> resolved... 03/30/15>    Aashrith returns for his annual CPX w/ report of elevBP & needs Rx for DOT- discussed lifestyle mod w/ DIET/ EXERCISE/ wt reduction, and we are starting low dose Amlodipine $RemoveBeforeD'5mg'SCwWzGShPdxONn$ /d; he will monitor BP at home & call for issues  In addition his Hyperlipidemia is unchanged, so is his weight etc- needs wt reduction & a low fat diet to effect improvement in these numbers;   Almost as an afterthought he noted SOB/DOE that seems to take 2 forms: 1) SOB w/ activity & wheezing noted- Rx w/ ProairHFA rescue inhaler 2sp Q6H as needed;  2) SOB at rest "like I can't get a DB, trouble getting the air "IN"- we discussed Rx w/ Lorazepam $RemoveBefo'1mg'TaFpzhcNKPW$ - 1/2 to 1 tab Tid prn   Hx AR, Hx sinusitis, bilat parotid hypertrophy>  Prev treated sinus w/ Depo, Pred, Levaquin, Mucinex, etc... rec to avoid antihist, use sialogogues etc...  HYPERLIPIDEMIA>  He has endeavored to control this w/ diet alone, unfortunately he has gained wt since stopping the smoking;  Needs f/u FLP & consideration of med rx...  GERD>  He continues on Prilosec as needed...  LBP>  Stable w/ prev eval from [name]...  Anxiety>  He uses Valium $RemoveBef'5mg'UsIDLtMeQI$  prn...   Patient's Medications  New Prescriptions   ALBUTEROL (PROAIR HFA) 108 (90 BASE) MCG/ACT INHALER    Inhale 1-2 puffs into the lungs every 6 (six) hours as needed for wheezing or shortness of breath.   AMLODIPINE (NORVASC) 5 MG TABLET    Take 1 tablet (5 mg total) by mouth daily.   LORAZEPAM (ATIVAN) 1 MG TABLET    Take 1 tablet (1 mg total) by mouth every 8 (eight) hours as needed for anxiety.  Previous Medications   EFINACONAZOLE (JUBLIA) 10 % SOLN    Apply 1 or 2 drops to the affected great toenail once daily for 12 months   IBUPROFEN (ADVIL,MOTRIN) 200 MG TABLET    Take 600 mg by mouth daily.   VALACYCLOVIR (VALTREX) 1000 MG TABLET      Modified Medications   No medications on file  Discontinued Medications   PREDNISONE (STERAPRED UNI-PAK) 5 MG TABS TABLET    Take 1 tablet (5 mg total) by mouth as directed.

## 2015-05-28 ENCOUNTER — Telehealth: Payer: Self-pay | Admitting: Pulmonary Disease

## 2015-05-28 MED ORDER — AMOXICILLIN-POT CLAVULANATE 875-125 MG PO TABS: 1.0000 | ORAL_TABLET | Freq: Two times a day (BID) | ORAL | Status: DC

## 2015-05-28 MED ORDER — METHYLPREDNISOLONE 4 MG PO TBPK: ORAL_TABLET | ORAL | Status: DC

## 2015-05-28 NOTE — Telephone Encounter (Signed)
Patient notified of Dr. Nadel's recommendations. Rx sent to pharmacy. Nothing further needed.  

## 2015-05-28 NOTE — Telephone Encounter (Signed)
Patient says he had a sinus infection x 2 weeks, it has now moved into his chest and he is coughing up lime green colored mucus.  Patient would like to have medication called in.  He says he has been taking Mucinex and Aleve, but its getting worse for the past week.  Allergies  Allergen Reactions  . Codeine Itching    Patient denies   Current Outpatient Prescriptions on File Prior to Visit  Medication Sig Dispense Refill  . albuterol (PROAIR HFA) 108 (90 BASE) MCG/ACT inhaler Inhale 1-2 puffs into the lungs every 6 (six) hours as needed for wheezing or shortness of breath. 1 Inhaler 3  . amLODipine (NORVASC) 5 MG tablet Take 1 tablet (5 mg total) by mouth daily. 90 tablet 2  . Efinaconazole (JUBLIA) 10 % SOLN Apply 1 or 2 drops to the affected great toenail once daily for 12 months (Patient not taking: Reported on 03/30/2015) 8 mL 11  . ibuprofen (ADVIL,MOTRIN) 200 MG tablet Take 600 mg by mouth daily.    [name] LORazepam (ATIVAN) 1 MG tablet Take 1 tablet (1 mg total) by mouth every 8 (eight) hours as needed for anxiety. 90 tablet 0  . metoprolol succinate (TOPROL-XL) 50 MG 24 hr tablet Take 1 tablet (50 mg total) by mouth daily. Take with or immediately following a meal. (Patient not taking: Reported on 03/30/2015) 30 tablet 6  . valACYclovir (VALTREX) 1000 MG tablet      No current facility-administered medications on file prior to visit.   Pharmacy: [facility]

## 2015-05-28 NOTE — Telephone Encounter (Signed)
Per SN: Augmentin '875mg'$ , 1 po BID #10 OTC Align Medrol Dosepak '4mg'$  as directed.

## 2015-06-24 ENCOUNTER — Other Ambulatory Visit: Payer: Self-pay

## 2015-07-08 ENCOUNTER — Other Ambulatory Visit: Payer: Self-pay

## 2015-07-09 NOTE — Telephone Encounter (Signed)
Refilled Jublia as prescribed by Dr. Blenda Mounts, if Prior Authorization is need pt will need an appt.

## 2015-09-07 DIAGNOSIS — J0110 Acute frontal sinusitis, unspecified: Secondary | ICD-10-CM | POA: Diagnosis not present

## 2015-09-16 ENCOUNTER — Encounter: Payer: Self-pay | Admitting: Internal Medicine

## 2015-09-16 ENCOUNTER — Ambulatory Visit (INDEPENDENT_AMBULATORY_CARE_PROVIDER_SITE_OTHER): Payer: BLUE CROSS/BLUE SHIELD | Admitting: Internal Medicine

## 2015-09-16 ENCOUNTER — Telehealth: Payer: Self-pay | Admitting: Pulmonary Disease

## 2015-09-16 VITALS — BP 130/80 | HR 80 | Temp 98.0°F | Ht 67.0 in | Wt 238.0 lb

## 2015-09-16 DIAGNOSIS — J441 Chronic obstructive pulmonary disease with (acute) exacerbation: Secondary | ICD-10-CM

## 2015-09-16 MED ORDER — AMOXICILLIN-POT CLAVULANATE 875-125 MG PO TABS: 1.0000 | ORAL_TABLET | Freq: Two times a day (BID) | ORAL | Status: DC

## 2015-09-16 MED ORDER — PREDNISONE 10 MG PO TABS: ORAL_TABLET | ORAL | Status: DC

## 2015-09-16 NOTE — Telephone Encounter (Signed)
Pt c/o cough and chest congestion x 3 week Pt reports having white mucus, chest pressure/tightness and increased SOB.  Denies current fever , pt did have fever in the morning in the beginning of being sick.  Pt reports this started as a sinus infection and went into his chest. Pt has tried OTC decongestants(Mucinex).   Pt aware that SN not in office this week.  Pt requesting an appt this afternoon with any Dr after 1pm.  Please advise on any rec's Dr Melvyn Novas or if patient can be worked in. Thanks.   Allergies  Allergen Reactions  . Codeine Itching    Patient denies

## 2015-09-16 NOTE — Progress Notes (Signed)
Subjective:     Patient ID: Richard Li, male   DOB: 1961-04-07, 55 y.o.   MRN: [id number]  HPI  Brief patient profile:  54 yowm quit smoking 2007 GOLD I copd on no maint rx   History of Present Illness  09/16/2015 acute extended ov/[name] re: cough/wjeeze c/w aecopd  Chief Complaint  Patient presents with  . Acute Visit    Pt c/o chest congestion and cough x 3 wks. Cough is prod- started out green but clear more recently. He also c/o chest tightness and occ wheezing.   baseline doing fine no need for inhalers/ abuptly ill x 3 weeks with sinus congestion/ cough prod of green mucus, sob with activity and assoc chest tight/ wheezy esp at hs  No obvious day to day or daytime variability or assoc  cp or overt   hb symptoms. No unusual exp hx or h/o childhood pna/ asthma or knowledge of premature birth.   Also denies any obvious fluctuation of symptoms with weather or environmental changes or other aggravating or alleviating factors except as outlined above   Current Medications, Allergies, Complete Past Medical History, Past Surgical History, Family History, and Social History were reviewed in [employer] record.  ROS  The following are not active complaints unless bolded sore throat, dysphagia, dental problems, itching, sneezing,  nasal congestion or excess/ purulent secretions, ear ache,   fever, chills, sweats, unintended wt loss, classically pleuritic or exertional cp, hemoptysis,  orthopnea pnd or leg swelling, presyncope, palpitations, abdominal pain, anorexia, nausea, vomiting, diarrhea  or change in bowel or bladder habits, change in stools or urine, dysuria,hematuria,  rash, arthralgias, visual complaints, headache, numbness, weakness or ataxia or problems with walking or coordination,  change in mood/affect or memory.          Review of Systems     Objective:   Physical Exam  amb wm nad   Wt Readings from Last 3 Encounters:  09/16/15 238 lb (107.956  kg)  03/30/15 236 lb 6.4 oz (107.23 kg)  06/03/14 235 lb 8 oz (106.822 kg)    Vital signs reviewed   HEENT: nl dentition,   and oropharynx. Nl external ear canals without cough reflex - moderate bilateral non-specific turbinate edema     NECK :  without JVD/Nodes/TM/ nl carotid upstrokes bilaterally   LUNGS: no acc muscle use,  Nl contour chest with min insp and exp rhonchi bilaterally  CV:  RRR  no s3 or murmur or increase in P2, no edema   ABD:  soft and nontender with nl inspiratory excursion in the supine position. No bruits or organomegaly, bowel sounds nl  MS:  Nl gait/ ext warm without deformities, calf tenderness, cyanosis or clubbing No obvious joint restrictions   SKIN: warm and dry without lesions    NEURO:  alert, approp, nl sensorium with  no motor deficits         Assessment:

## 2015-09-16 NOTE — Patient Instructions (Signed)
Augmentin 875 mg take one pill twice daily  X 10 days - take at breakfast and supper with large glass of water.  It would help reduce the usual side effects (diarrhea and yeast infections) if you ate cultured yogurt at lunch.  Prednisone 10 mg take  4 each am x 2 days,   2 each am x 2 days,  1 each am x 2 days and stop    Only use your albuterol as a rescue medication to be used if you can't catch your breath  - The less you use it, the better it will work when you need it. - Ok to use up to 2 puffs  every 4 hours if you must but call for immediate appointment if use goes up over your usual need - Don't leave home without it !!  (think of it like the spare tire for your car)    For cough/congestion  > mucinex or mucinex dm up 1200 mg every 12 hours  If not 100% better in 2 weeks, return to see Dr Lenna Gilford

## 2015-09-16 NOTE — Telephone Encounter (Signed)
Pt added on per Magda Paganini at 2pm to be seen by MW. Nothing further needed.

## 2015-09-17 ENCOUNTER — Encounter: Payer: Self-pay | Admitting: Internal Medicine

## 2015-09-17 DIAGNOSIS — J441 Chronic obstructive pulmonary disease with (acute) exacerbation: Secondary | ICD-10-CM | POA: Insufficient documentation

## 2015-09-17 NOTE — Assessment & Plan Note (Signed)
Chronically doing well on min rx but now with acute exac in setting of ? Sinusitis   Rec: augmentin x 10 day Prednisone 10 mg take  4 each am x 2 days,   2 each am x 2 days,  1 each am x 2 days and stop    F/u with Dr Lenna Gilford if not better in 2 weeks  I had an extended discussion with the patient reviewing all relevant studies completed to date and  lasting 15 to 20 minutes of a 25 minute visit    Each maintenance medication was reviewed in detail including most importantly the difference between maintenance and prns and under what circumstances the prns are to be triggered using an action plan format that is not reflected in the computer generated alphabetically organized AVS.    Please see instructions for details which were reviewed in writing and the patient given a copy highlighting the part that I personally wrote and discussed at today's ov.

## 2015-09-21 ENCOUNTER — Other Ambulatory Visit: Payer: Self-pay | Admitting: Pulmonary Disease

## 2015-09-30 ENCOUNTER — Other Ambulatory Visit: Payer: Self-pay | Admitting: *Deleted

## 2015-09-30 NOTE — Telephone Encounter (Signed)
Lorazepam '1mg'$  refilled to [id number] on 09/25/2015 Nothing further needed.

## 2015-10-30 DIAGNOSIS — D1801 Hemangioma of skin and subcutaneous tissue: Secondary | ICD-10-CM | POA: Diagnosis not present

## 2016-01-08 ENCOUNTER — Other Ambulatory Visit: Payer: Self-pay | Admitting: Pulmonary Disease

## 2016-01-11 NOTE — Telephone Encounter (Signed)
Pt requesting refill on Lorazepam '1mg'$ . Last refill: 09/25/15 [facility] with 1 refill. Ok to refill rx?

## 2016-01-12 ENCOUNTER — Telehealth: Payer: Self-pay | Admitting: Pulmonary Disease

## 2016-01-12 NOTE — Telephone Encounter (Signed)
Spoke with pt. He is requesting a refill on Lorazepam. He would also like to have this prescription increased. States, "I just can't feel them working." He is taking 2 tablets every evening and if those don't kick to help him sleep he will take another one.  SN - please advise. Thanks.

## 2016-01-13 MED ORDER — TRAZODONE HCL 100 MG PO TABS: 100.0000 mg | ORAL_TABLET | Freq: Every day | ORAL | 1 refills | Status: DC

## 2016-01-13 NOTE — Telephone Encounter (Signed)
SN please advise  Allergies  Allergen Reactions  . Codeine Itching    Current Outpatient Prescriptions:  .  albuterol (PROAIR HFA) 108 (90 BASE) MCG/ACT inhaler, Inhale 1-2 puffs into the lungs every 6 (six) hours as needed for wheezing or shortness of breath., Disp: 1 Inhaler, Rfl: 3 .  amoxicillin-clavulanate (AUGMENTIN) 875-125 MG tablet, Take 1 tablet by mouth 2 (two) times daily., Disp: 20 tablet, Rfl: 0 .  guaiFENesin (MUCINEX) 600 MG 12 hr tablet, Take 600 mg by mouth 2 (two) times daily., Disp: , Rfl:  .  ibuprofen (ADVIL,MOTRIN) 200 MG tablet, Take 600 mg by mouth daily., Disp: , Rfl:  .  LORazepam (ATIVAN) 1 MG tablet, TAKE ONE TABLET BY MOUTH EVERY 8 HOURS AS NEEDED FOR ANXIETY, Disp: 90 tablet, Rfl: 1 .  predniSONE (DELTASONE) 10 MG tablet, Take  4 each am x 2 days,   2 each am x 2 days,  1 each am x 2 days and stop, Disp: 14 tablet, Rfl: 0

## 2016-01-13 NOTE — Telephone Encounter (Signed)
Per SN----  Stop the lorazepam and start on desyrel 100 mg  1 po qhs.  And pt will need ROV with SN in 1 month.  thanks

## 2016-01-13 NOTE — Telephone Encounter (Signed)
Spoke with pt. He is aware of SN's recommendation. Rx has been sent in. He will call back about ROV due to his work schedule.

## 2016-02-16 ENCOUNTER — Encounter: Payer: Self-pay | Admitting: Pulmonary Disease

## 2016-02-16 ENCOUNTER — Ambulatory Visit (INDEPENDENT_AMBULATORY_CARE_PROVIDER_SITE_OTHER): Payer: BLUE CROSS/BLUE SHIELD | Admitting: Pulmonary Disease

## 2016-02-16 VITALS — BP 160/82 | HR 72 | Temp 97.0°F | Ht 67.0 in | Wt 233.5 lb

## 2016-02-16 DIAGNOSIS — J449 Chronic obstructive pulmonary disease, unspecified: Secondary | ICD-10-CM

## 2016-02-16 DIAGNOSIS — F411 Generalized anxiety disorder: Secondary | ICD-10-CM

## 2016-02-16 DIAGNOSIS — Z23 Encounter for immunization: Secondary | ICD-10-CM

## 2016-02-16 DIAGNOSIS — G8929 Other chronic pain: Secondary | ICD-10-CM

## 2016-02-16 DIAGNOSIS — E784 Other hyperlipidemia: Secondary | ICD-10-CM | POA: Diagnosis not present

## 2016-02-16 DIAGNOSIS — E663 Overweight: Secondary | ICD-10-CM

## 2016-02-16 DIAGNOSIS — I10 Essential (primary) hypertension: Secondary | ICD-10-CM

## 2016-02-16 DIAGNOSIS — K111 Hypertrophy of salivary gland: Secondary | ICD-10-CM | POA: Diagnosis not present

## 2016-02-16 DIAGNOSIS — E7849 Other hyperlipidemia: Secondary | ICD-10-CM

## 2016-02-16 DIAGNOSIS — M545 Low back pain, unspecified: Secondary | ICD-10-CM

## 2016-02-16 MED ORDER — LORAZEPAM 1 MG PO TABS: 1.0000 mg | ORAL_TABLET | Freq: Three times a day (TID) | ORAL | 5 refills | Status: DC | PRN

## 2016-02-16 NOTE — Patient Instructions (Signed)
Today we updated your med list in our EPIC system...    Continue your current medications the same...  We discussed continuing the LORAZEPAM (Ativan) '1mg'$  tabs- 1/2 to 1 tab up to 3 times daily as needed...  We gave you the 2017 FLU shot today...  Work on [employer] reduction (low carb & low fat diet)...  Call for any questions...  Let's plan a follow up visit in [place] so w/ FASTING blood work..Marland KitchenMarland Kitchen

## 2016-02-16 NOTE — Progress Notes (Signed)
Subjective:    Patient ID: Richard Li, male    DOB: February 04, 1961, 55 y.o.   MRN: [id number]  HPI 55 y/o WM former heavy smoker w/ mild/mod airflow obstruction on Spirometry but min symptoms since he quit smoking in 2007; see prob list below>>   ~  [name]2009:  he was a very heavy smoker and quit in 2007... he has moderate COPD but remarkably preserved PFT's in the past... he was on Symbicort but stopped this on his own... ~  [id number]:  55yrROV- notes some SOB w/ activity in hot weather... [place] tried [facility] [place] states that he didn't like them & the TV advertisment scared him...  ~  Oct2011:  presents w/ 127mox left chest wall pain- CXR repeat> COPD, NAD & rib details neg; symptoms finally resolved after one month off work & time to heal rib fxs...  CXR 8/13 showed normal heart size, clear lungs, NAD...Marland KitchenMarland KitchenEKG 8/13 showed NSR, rate70, wnl, NAD...  LABS 8/13:  FLP- chol ok but TG=259;  Chems- wnl;  CBC- wnl;  TSH=2.11;  PSA=0.45;  UA- wnl   ~  December 25, 2012:  Yearly ROV & CPX> Richard Li a good year overall- he fell in his yard on his right arm 1 wk ago w/ pain in the elbow area & we discussed hot soaks, Mobic15 & OV w/ XRays by Richard Li etHarriett Li. He also reports seeing Richard Li, Derm & started on Lamisil orally- needs LFTs checked & copy to Derm- OK... We reviewed the following medical problems during today's office visit >>     HxAR> he has seen Richard Li in the past w/ sinus turbinate reduction in 2002; asked to avoid antihist due to parotid dis...    Parotid Hypertrophy> known bilat parotid swelling, no change & he denies dry mouth symptoms or sequelae, rec for sialogogues...     COPD> former very heavy smoker, quit 2007; see prev CXR, CTChest, PFTs below; notes min cough, no sput, no hemoptysis, denies SOB, etc...    Hyperlipid> on low fat diet alone but remains overweight & is up 9# this yr; last FLP 8/13 showed TChol 139, TG 259, HDL 35, LDL 58; rec for better low fat diet &  get wt down.    Overweight> weight is up 9# to 246# this yr; BMI ~35 and we reviewed diet, exercise, & wt reduction strategies...    GI- GERD, needs screening colon> he uses OTC Prilosec prn; denies dysphagia, abd pain, n/v, c/d, blood seen; needs screening colon...     GU- he's had prev vasectomy by Richard Li 2012... Denies LTOS etc...    HxLBP> prev ortho eval by Richard Li... He uses OTC analgesics as needed...    Anxiety> on Valium5 as needed & requests refill...    Onychomycosis> he had eval by Richard MccallumDrJones, & was started on Lamisil orally,,,  We reviewed prob list, meds, xrays and labs> see below for updates >>   CXR 9/14 showed COPD, patchy opac RUL- ?inflamm, ?atx, ?other... We decided to treat w/ Mucinex, Fluids, & repeat CXR 60m360mo  LABS 9/14 (not fasting today):  Chems- ok x Na=130, LFTs= wnl;  CBC- wnl;  TSH=2.38;  PSA=0.43...  ADDENDUM>> follow up CXR 10/14 showed resolution of prev RUL opac; norm heart size, mild hyperinflation, NAD...  ~  June 03, 2014:  30m64mo & Richard Li/o recent URI symptoms, bilat parotid gland hypertrophy, [name]> he notes URI about 2 wks ago & went to  Richard Li treated w/ ?antibiotic but he can't recall what & only sl better; then called here c/o TMJ pain & he was told Dentist or Oral surgeon required; we did agree to try hot soaks, Advil vs Aleve, & gave him a Pred dosepak- sl better; he is quite vague about his actual symptoms but he has long hx of bilat parotid hypertrophy & advised to use sialogogues, lozenges, warm compresses, etc (and he's been prev offered ENT consultation)... We reviewed the following medical problems during today's office visit >>     HxAR> he has seen Richard Li in the past w/ sinus turbinate reduction in 2002; asked to avoid antihist due to parotid dis...    Parotid Hypertrophy> known bilat parotid swelling, no change & he denies dry mouth symptoms or sequelae, rec for sialogogues, lozenges, warm compresses, f/u w/ ENT if  necessary...     COPD> former very heavy smoker, quit 2007; see prev CXR, CTChest, PFTs below; notes min cough, no sput, no hemoptysis, denies SOB, etc...    Hyperlipid> on low fat diet alone but remains overweight at 236# (BMI=34); last FLP 8/13 showed TChol 139, TG 259, HDL 35, LDL 58; rec for better low fat diet, get wt down, ret for another FLP.    Overweight> weight is 236# this yr; BMI ~34 and we reviewed diet, exercise, & wt reduction strategies...    GI- GERD, needs screening colon> he uses OTC Prilosec prn; denies dysphagia, abd pain, n/v, c/d, blood seen; he had screening colonoscopy 12/14 by Richard Li- NEG, wnl & rec to f/u 10 yrs.    GU- he's had prev vasectomy by Richard Li 2012... Denies LTOS etc...    HxLBP> prev ortho eval by Richard Li... He uses OTC analgesics as needed...    Anxiety> on Valium5 as needed & requests refill...    Onychomycosis> he had eval by Richard Li, Richard Li, & was prev rx w/ Lamisil; then he saw Podiarty Richard Li & treated w/ Jublia... We reviewed prob list, meds, xrays and labs> see below for updates >>  PLAN>> his symptoms are quite vague but he is concerned; rec to use the Pred dosepak as prescribed, warm compresses, sialogogues/ lozenges, Advil vs Aleve; rec f/u appt w/ ENT Richard Li for his opinion & pt will consider this; BP is elev and we decided to start treatment w/ MetopER50, no salt in diet, & follow up BP here...  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 we reviewed the following medical problems during today's office visit >>    HxAR> he has seen Richard Li in the past w/ sinus turbinate reduction in 2002; asked to  avoid antihist due to parotid dis, ok to use nasal Saline, OTC Flonase, etc...    Parotid Hypertrophy> known bilat parotid swelling, no change & he notes some dry mouth symptoms or sequelae, rec for sialogogues, Biotene, lozenges, warm compresses, f/u w/ ENT if necessary...     COPD> former heavy smoker, quit 2007; see prev CXR, CTChest, PFTs below; notes min cough, no sput, no hemoptysis, but says incr SOB "like I can't  get a DB, not satisfied breathing"=> rec to try PROAIR prn & ATIVAN 22m prn.    HBP>  As above- we discussed lifestyle mod w/ diet/ exercise/ wt reduction & decided to place him on Amlodipine 5638md w/ careful f/u BPs at home & here...    Hyperlipid> on low fat diet alone but remains overweight at 236# (BMI=34); FLP 12/16 showed TChol 141, TG 296, HDL 31, LDL 58; rec for better low fat diet, get wt down in order to improve the TG & HDL.    Overweight> weight is 236# this yr; BMI ~34 and we reviewed diet, exercise, & wt reduction strategies...    GI- GERD, needs screening colon> he uses OTC Prilosec prn; denies dysphagia, abd pain, n/v, c/d, blood seen; he had screening colonoscopy 12/14 by Richard Li- NEG, wnl & rec to f/u 10 yrs.    GU- he's had prev vasectomy by Richard Li 2012... Denies LTOS etc...    HxLBP> prev ortho eval by Richard Li... He uses OTC analgesics as needed...    Anxiety> prev on Valium5 as needed in past; we discussed use of Lorazepam 38m42m1/2 to 1 tab Tid as needed for dyspnea & anxiety...     Onychomycosis> he had eval by DerPayton MccallumrJones, & was prev rx w/ Lamisil; then he saw Podiarty Richard Li & treated w/ Jublia... EXAM shows Afeb, VSS, O2sat=98% on RA,  HEENT- marked parotid hypertrophy, mallamapati2;  Chest- clear w/o w/r/r;  Heart- RR w/o m/r/g;  Abd- neg, nontender, no masses;  Ext- neg w/o c/c/e;  Neuro- neg w/o focal abn...   CXR 03/30/15>  Norm heart size, lungs are clear x some scarring at right base, NAD...   Spirometry 03/30/15>  FVC=5.19 (107%), FEV1=3.22  (84%), %1sec=62, mid-flows reduced at 41% predicted... This is c/w mild-mod airflow obstruction & we discussed PROAIR-HFA for prn use...  LABS 03/30/15>  FLP- Chol ok but TG elev;  Chems- wnl;  CBC- wnl;  TSH=2.02;  PSA=0.46 IMP/PLAN>>  LarTirthturns for his annual CPX w/ report of elevBP & needs Rx for DOT- discussed lifestyle mod w/ DIET/ EXERCISE/ wt reduction, and we are starting low dose Amlodipine 5mg25m he will monitor BP at home & call for issues  In addition his Hyperlipidemia is unchanged, so is his weight etc- needs wt reduction & a low fat diet to effect improvement in these numbers;  Almost as an afterthought he noted SOB/DOE that seems to take 2 forms: 1) SOB w/ activity & wheezing noted- Rx w/ ProairHFA rescue inhaler 2sp Q6H as needed;  2) SOB at rest "like I can't get a DB, trouble getting the air "IN"- we discussed Rx w/ Lorazepam 38mg-106m2 to 1 tab Tid prn...   ~  February 16, 2016:  138mo 57mo Richard Spurgeonns w/ several concerns> 1) he was switched from Lorazepam to Desyrel over the phone to help him rest but the Desyrel actually doesn't work for him & the Ativan was better when taking 38mg in66me eve to relax and another 38mg at 24mtime for sleep which works well by his description; we reviewed Rx for Lorazepam 38mg- 1/245m 1 tab up to Tid as needed...  2) he has some discomfort in his left shoulder ?injury several months ago & he takes Aleve daily, we will add Voltaren gel & refer to Ortho when he is ready...     Breathing is stable- denies cough, sput, ch in DOE/SOB, edema, etc... He has declined to use regular inhalers and has NOT  had resp exac; he saw DrWet 08/2015 in my absence w/ sinus infection (Rx Levaquin, Pred, & improved)..    BP appears sl evel at 160/82 & pt stopped the [facility] on his own (we started 12/16)- he prefers to control w/ DIET but told he will need to lose the weight...    Chol looks ok on diet alone but TG elev & HDL is low- we discussed need to get on diet & get the  weight down if he wants to avoid meds...    Anxiety/ Insomnia> the Lorazepam '1mg'$  works well for him, rests satis, wakes refreshed, no daytime hypersomnolence...  EXAM shows Afeb, VSS, O2sat=98% on RA,  HEENT- marked parotid hypertrophy, mallamapati2;  Chest- clear w/o w/r/r;  Heart- RR w/o m/r/g;  Abd- neg, nontender, no masses;  Ext- neg w/o c/c/e;  Neuro- neg w/o focal abn.. IMP/PLAN>>  Richard Li reports that he is doing satis on the Lorazepam in the eve to help him rest, medically stable; he injured his left shoulder several months ago & needs ortho eval; add Voltaren Gel to his Aleve and rec OV w/ Ortho for assessment...           Problem List:     Bilat PAROTID GLAND HYPERTROPHY >> known parotid hypertrophy, no nodules palpated and normal salivary secretions & dentition; we reviewed the rec for sialogogues such as lemon drops etc... ~  2/16: he is c/o his parotid hypertrophy but it seems the same on exam from rev evaluations; rec warm compressed, sialogogues/ lozenges, Advil/ Aleve, & we wrote for an empiric Pred dosepak trial; he will call ENT for their review...  CHEST PAIN, ATYPICAL (ICD-786.59) - no prev hx of cardiac problems... risk factors include: +FamHx, Smoking Hx, low HDL, male gender... we will screen w/ a Stress Myoview... he saw [name] 5/08- EKG w/ NSR, IRBBB, NSSTTWA... Myoview was done 8/09 & showed good exerc capacity, normal BP response, no signif EKG changes, normal images w/o ischemia/ scar & EF= 58%... ~  EKG 8/11 shows NSR, WNL.[name]. ~  8/13:  Denies CP, palpit, SOB, edema; BP= 150/80 after rest; exercise= yard work; he knows to avoid salt & get wt down...  ALLERGIC RHINITIS (ICD-477.9) - uses OTC meds as needed... prev ENT surgery from [place] in 2002 w/ turbinate reduction... he has prominent parotid hypertrophy bilat==> & we discussed Rx w/ sialogogues etc...  COPD (ICD-496) - former heavy smoker and quit in 2007... chronic bronchitic symptoms... prev on Symbicort but  he stopped this on his own & refuses Spiriva due to side effects discussed on TV ads...  ABNORMAL CHEST XRAY (ICD-793.1) - prev CXR's w/ hyperinflation c/w COPD, 1cm RLL density without change- likely scarring... ~  CTChest 11/07 showed hyperinflation w/ emphysematous changes in the upper lobes, 5m RML nodule without change, several other scattered sm nodules, pleural thickening, and ca++ in Abd Ao...  ~  baseline CXR shows hyperinflation, some scarring, NAD... ~  PFT's 7/09 showed FVC= 6.61 (135%), FEV1=3.88 (97%), %1sec=59, mid-flows= 44%. ~  CXR 8/11 showed COPD w/ hyperaeration, min scarring, NAD> confirmed 10/11 & Left Rib details= neg. ~  8/11:  started on DULERA 100-5 2spBid==> he stopped "I don't like meds" ~  CXR 8/13 showed normal heart size, clear lungs, NAD..[name] ~  CXR 9/14 showed COPD, patchy opac RUL- ?inflamm, ?atx, ?other... We decided to treat w/ Mucinex, Fluids, & repeat CXR 153mo f/u film showed resolution of RUL opacity...  HYPERLIPIDEMIA (ICD-272.4) - on diet therapy alone w/ incr TG  assoc w/ recent weight gain... discussed diet + exercise. ~  FLP 8/09 (wt=203#) showed TChol 139, TG 116, HDL 35, LDL 81 ~  FLP 8/11 (wt=230#) showed TChol 145, TG 247, HDL 33, LDL 68... advised diet + exercise, get wt down. ~  FLP 8/13 (wt=237#) showed TChol 139, TG 259, HDL 35, LDL 58 ~  9/14: pt not fasting for f/u FLP- he's gained 9# & we reviewed need for low carb, low fat diet & wt reduction...  OBESITY >> we reviewed diet recommendations, exercise prescription, & wt reduction strategies.. ~  Weight 8/13 = 237#, BMI= 34 ~  Weight 9/14 = 246# ~  Weight 2/16 = 236#  GERD (ICD-530.81) - on PRILOSEC 63m Prn for reflux symptoms... ~  Screening colonoscopy 12/14 by Richard Li was NEG- wnl & f/u rec in 10 yrs...  GU> He had Vasectomy from Richard Li in Jan2012...  BACK PAIN, LUMBAR (ICD-724.2) - prev eval from Richard Li...  ANXIETY (ICD-300.00) - on VALIUM 546mas needed... he states Xanax is  too strong...  Health Maintenance - FlArgentinehowed rectal fissure otherw neg...   Current Medications, Allergies, Past Medical History, Past Surgical History, Family History, and Social History were reviewed in [employer].     Past Surgical History:  Procedure Laterality Date  . APPENDECTOMY    . COLONOSCOPY  15 years ago  . NASAL TURBINATE REDUCTION  2002   Dr.Crossley  . VASECTOMY      Outpatient Encounter Prescriptions as of 02/16/2016  Medication Sig  . albuterol (PROAIR HFA) 108 (90 BASE) MCG/ACT inhaler Inhale 1-2 puffs into the lungs every 6 (six) hours as needed for wheezing or shortness of breath.  . guaiFENesin (MUCINEX) 600 MG 12 hr tablet Take 600 mg by mouth 2 (two) times daily.  . Marland Kitchenbuprofen (ADVIL,MOTRIN) 200 MG tablet Take 600 mg by mouth daily.  . Marland KitchenORazepam (ATIVAN) 1 MG tablet Take 1 tablet (1 mg total) by mouth 3 (three) times daily as needed. for anxiety  . [DISCONTINUED] amoxicillin-clavulanate (AUGMENTIN) 875-125 MG tablet Take 1 tablet by mouth 2 (two) times daily. (Patient not taking: Reported on 02/16/2016)  . [DISCONTINUED] predniSONE (DELTASONE) 10 MG tablet Take  4 each am x 2 days,   2 each am x 2 days,  1 each am x 2 days and stop (Patient not taking: Reported on 02/16/2016)  . [DISCONTINUED] traZODone (DESYREL) 100 MG tablet Take 1 tablet (100 mg total) by mouth at bedtime. (Patient not taking: Reported on 02/16/2016)   No facility-administered encounter medications on file as of 02/16/2016.     Allergies  Allergen Reactions  . Codeine Itching    Current Medications, Allergies, Past Medical History, Past Surgical History, Family History, and Social History were reviewed in [employer].    Review of Systems        See HPI - all other systems neg except as noted...       The patient complains of chest pain and dyspnea on exertion.  The patient denies anorexia, fever, weight loss,  weight gain, vision loss, decreased hearing, hoarseness, syncope, peripheral edema, prolonged cough, headaches, hemoptysis, abdominal pain, melena, hematochezia, severe indigestion/heartburn, hematuria, incontinence, muscle weakness, suspicious skin lesions, transient blindness, difficulty walking, depression, unusual weight change, abnormal bleeding, enlarged lymph nodes, and angioedema.     Objective:   Physical Exam      WD, WN, 5543/o WM in NAD... GENERAL:  Alert & oriented; pleasant & cooperative... He has  very large symmetric bilat parotid glands, no nodules or tenderness... HEENT:  [place]/AT, EOM-wnl, PERRLA,  EACs-clear, TMs-wnl, NOSE- turbinates boggy, no drainage; THROAT-clear & wnl;  NECK:  Supple w/ fairROM; no JVD; normal carotid impulses w/o bruits; no thyromegaly or nodules palpated; no lymphadenopathy. CHEST:  Decr BS bilat but clear to P & A; without wheezes/ rales/ or rhonchi heard...  HEART:  Regular Rhythm; without murmurs/ rubs/ or gallops detected... ABDOMEN:  Soft & nontender; normal bowel sounds; no organomegaly or masses palpated... EXT: without deformities, mild arthritic changes; no varicose veins/ venous insuffic/ or edema. NEURO:  CN's intact; motor testing normal; sensory testing normal; gait normal & balance OK. DERM:  No lesions noted; no rash, several tatoos...  RADIOLOGY DATA:  Reviewed in the EPIC EMR & discussed w/ the patient...  LABORATORY DATA:  Reviewed in the EPIC EMR & discussed w/ the patient...   Assessment & Plan:    COPD, ex-smoker, Hx Abn CXR>  He refuses the [facility] etc because he doesn't like meds and doesn't think he needs them; ?RUL opac- Rx mucinex fluids, f/u CXR=> resolved... 03/30/15>    Chaston returns for his annual CPX w/ report of elevBP & needs Rx for DOT- discussed lifestyle mod w/ DIET/ EXERCISE/ wt reduction, and we are starting low dose Amlodipine 26m/d; he will monitor BP at home & call for issues  In addition his Hyperlipidemia is  unchanged, so is his weight etc- needs wt reduction & a low fat diet to effect improvement in these numbers;  Almost as an afterthought he noted SOB/DOE that seems to take 2 forms: 1) SOB w/ activity & wheezing noted- Rx w/ ProairHFA rescue inhaler 2sp Q6H as needed;  2) SOB at rest "like I can't get a DB, trouble getting the air "IN"- we discussed Rx w/ Lorazepam 153m 1/2 to 1 tab Tid prn => this really helped...   Hx AR, Hx sinusitis, bilat parotid hypertrophy>  Prev treated sinus w/ Depo, Pred, Levaquin, Mucinex, etc... rec to avoid antihist, use sialogogues etc...  HYPERLIPIDEMIA>  He has endeavored to control this w/ diet alone, unfortunately he has gained wt since stopping the smoking;  Needs f/u FLP & consideration of med rx...  GERD>  He continues on Prilosec as needed...  LBP>  Stable w/ prev eval from Richard Li...  Anxiety>  He uses Valium 68m84mrn...   Patient's Medications  New Prescriptions   No medications on file  Previous Medications   ALBUTEROL (PROAIR HFA) 108 (90 BASE) MCG/ACT INHALER    Inhale 1-2 puffs into the lungs every 6 (six) hours as needed for wheezing or shortness of breath.   GUAIFENESIN (MUCINEX) 600 MG 12 HR TABLET    Take 600 mg by mouth 2 (two) times daily.   IBUPROFEN (ADVIL,MOTRIN) 200 MG TABLET    Take 600 mg by mouth daily.  Modified Medications   Modified Medication Previous Medication   LORAZEPAM (ATIVAN) 1 MG TABLET LORazepam (ATIVAN) 1 MG tablet      Take 1 tablet (1 mg total) by mouth 3 (three) times daily as needed. for anxiety    TAKE ONE TABLET BY MOUTH EVERY 8 HOURS AS NEEDED FOR ANXIETY  Discontinued Medications   AMOXICILLIN-CLAVULANATE (AUGMENTIN) 875-125 MG TABLET    Take 1 tablet by mouth 2 (two) times daily.   PREDNISONE (DELTASONE) 10 MG TABLET    Take  4 each am x 2 days,   2 each am x 2 days,  1 each am x  2 days and stop   TRAZODONE (DESYREL) 100 MG TABLET    Take 1 tablet (100 mg total) by mouth at bedtime.

## 2016-03-23 ENCOUNTER — Telehealth: Payer: Self-pay | Admitting: Pulmonary Disease

## 2016-03-23 MED ORDER — PREDNISONE 5 MG (21) PO TBPK: ORAL_TABLET | ORAL | 0 refills | Status: DC

## 2016-03-23 MED ORDER — AMOXICILLIN-POT CLAVULANATE 875-125 MG PO TABS: 1.0000 | ORAL_TABLET | Freq: Two times a day (BID) | ORAL | 0 refills | Status: DC

## 2016-03-23 NOTE — Telephone Encounter (Signed)
Per SN---  Augmentin 875 mg  #14  1 po bid pred dosepak  5 mg  6 day pack Align once daily   Called and spoke with pt and he is aware of SN recs. Nothing further is needed.

## 2016-03-23 NOTE — Telephone Encounter (Signed)
Spoke with the pt  He c/o nasal congestion, bloody nasal d/c, sinus pressure/HA and non prod cough x 3 days  He is asking for abx  He has not tried any OTC  Allergies  Allergen Reactions  . Codeine Itching    Please advise, thanks!

## 2016-04-22 ENCOUNTER — Encounter: Payer: Self-pay | Admitting: Podiatry

## 2016-04-22 ENCOUNTER — Ambulatory Visit (INDEPENDENT_AMBULATORY_CARE_PROVIDER_SITE_OTHER): Payer: BLUE CROSS/BLUE SHIELD | Admitting: Podiatry

## 2016-04-22 DIAGNOSIS — L600 Ingrowing nail: Secondary | ICD-10-CM | POA: Diagnosis not present

## 2016-04-22 DIAGNOSIS — L03031 Cellulitis of right toe: Secondary | ICD-10-CM

## 2016-04-22 MED ORDER — CEPHALEXIN 500 MG PO CAPS: 500.0000 mg | ORAL_CAPSULE | Freq: Two times a day (BID) | ORAL | 0 refills | Status: DC

## 2016-04-22 NOTE — Progress Notes (Signed)
Subjective:     Patient ID: Richard Li, male   DOB: Mar 14, 1961, 55 y.o.   MRN: [id number]  HPI patient presents with painful ingrown toenail his right big toe that he try to cut on himself and he's tried to soak without relief of symptoms   Review of Systems     Objective:   Physical Exam Neurovascular status intact muscle strength was adequate with patient found to have a right hallux medial border that's inflamed and painful when pressed and there is some redness around the distal portion but no proximal edema erythema or drainage noted    Assessment:     Ingrown toenail deformity with localized paronychia infection present    Plan:     Reviewed both elements of condition and I did place on antibiotic to deal with paronychia type component of this and I've recommended correction of the underlying deformity and patient wants this done understanding risk. Today I infiltrated the right hallux 60 mg Xylocaine Marcaine mixture removed the medial border did not note any obvious drainage and then cleaned the corner out exposed matrix and applied phenol 3 applications 30 seconds followed by alcohol lavage. Instructed on soaks and reappoint

## 2016-04-26 ENCOUNTER — Telehealth: Payer: Self-pay | Admitting: *Deleted

## 2016-04-26 NOTE — Telephone Encounter (Addendum)
Pt states the cephalexin is making him sick with nausea and diarrhea. I told pt to stop the antibiotic until Ddr. Regal gave further orders, to begin 1/2C Epsom salt and 1Qt warm water soaks 20 minutes 2 x daily and use lightly coated neosporin bandaid until I called again. Pt states he thinks he should continue the antibiotic because the toe is swollen, red and painful. I told him he could continue if he liked and I would call again. 04/27/2016-Informed pt of Dr Mellody Drown orders and he states the toe looks a little better after the epsom salt soaks, but not a lot still puffy. Pt states he tried the Keflex again with a biscuit and he is still nauseous. I told pt to stop the keflex and I would call in Septra DS to [place], and if the toe did not have a lot improvement by noon tomorrow to make an appt to be seen Friday. Pt states understanding.06/28/2016-Pt states he has an ingrown procedure and the little is infected, or does he need an appt. Left message informing pt he needed to call for an appt. 06/29/2016-Pt called and states the toe got better then started to look infected, and he just wanted an antibiotic. I told pt that since it looked good then began to look infected, there may be something else going on and it had been over 2 weeks since the last appt, Dr. Paulla Dolly may need to go back in and clear the area out. Pt states understanding and I transferred to schedulers.

## 2016-04-27 MED ORDER — SULFAMETHOXAZOLE-TRIMETHOPRIM 800-160 MG PO TABS: 1.0000 | ORAL_TABLET | Freq: Two times a day (BID) | ORAL | 0 refills | Status: DC

## 2016-04-27 NOTE — Telephone Encounter (Signed)
Could switch him to septra and if not improving in the next couple of days have him come in

## 2016-06-10 ENCOUNTER — Encounter: Payer: Self-pay | Admitting: Podiatry

## 2016-06-10 ENCOUNTER — Ambulatory Visit (INDEPENDENT_AMBULATORY_CARE_PROVIDER_SITE_OTHER): Payer: BLUE CROSS/BLUE SHIELD

## 2016-06-10 ENCOUNTER — Ambulatory Visit (INDEPENDENT_AMBULATORY_CARE_PROVIDER_SITE_OTHER): Payer: BLUE CROSS/BLUE SHIELD | Admitting: Podiatry

## 2016-06-10 DIAGNOSIS — M779 Enthesopathy, unspecified: Secondary | ICD-10-CM

## 2016-06-10 DIAGNOSIS — M79671 Pain in right foot: Secondary | ICD-10-CM

## 2016-06-10 DIAGNOSIS — B351 Tinea unguium: Secondary | ICD-10-CM

## 2016-06-10 DIAGNOSIS — L600 Ingrowing nail: Secondary | ICD-10-CM | POA: Diagnosis not present

## 2016-06-10 NOTE — Patient Instructions (Signed)

## 2016-06-10 NOTE — Progress Notes (Signed)
   Subjective:    Patient ID: Richard Li, male    DOB: 23-Oct-1960, 56 y.o.   MRN: [id number]  HPI   Chief Complaint  Patient presents with  . Nail Problem    Right; Great Toe; Medial side. Pt states that the toe is red and swollen. Had ingrown removal on 04/22/16.   [name] Foot Pain    BL; Plantar Forefoot. Pt states that he has had tingiling and in the toes and radiates to the ball of his foot.       Review of Systems     Objective:   Physical Exam        Assessment & Plan:

## 2016-06-13 NOTE — Progress Notes (Signed)
Subjective:     Patient ID: Richard Li, male   DOB: 1960/07/13, 56 y.o.   MRN: [id number]  HPI patient presents disease concerned because she's had some redness in the right hallux on the side and he was concerned about infection also is had some tingling in his feet   Review of Systems     Objective:   Physical Exam Neurovascular status intact with patient found to have tingling in the plantar aspect of both feet that is localized in nature with a well-healed surgical site right hallux medial border with no drainage noted mild erythema and no proximal edema erythema or drainage noted    Assessment:     Mild paronychia infection right hallux localized in nature with no proximal spread or indications of pathology along with minimal neuropathic symptomatology    Plan:     Reviewed both conditions and do not recommend current treatment except for soaks and cushion-type shoes and if symptoms were to worsen patient will be seen back to recheck

## 2016-06-26 ENCOUNTER — Other Ambulatory Visit: Payer: Self-pay | Admitting: Pulmonary Disease

## 2016-07-09 DIAGNOSIS — R112 Nausea with vomiting, unspecified: Secondary | ICD-10-CM | POA: Diagnosis not present

## 2016-07-09 DIAGNOSIS — J441 Chronic obstructive pulmonary disease with (acute) exacerbation: Secondary | ICD-10-CM | POA: Diagnosis not present

## 2016-07-09 DIAGNOSIS — J00 Acute nasopharyngitis [common cold]: Secondary | ICD-10-CM | POA: Diagnosis not present

## 2016-08-28 ENCOUNTER — Other Ambulatory Visit: Payer: Self-pay | Admitting: Pulmonary Disease

## 2016-08-30 NOTE — Telephone Encounter (Signed)
Dr. Lenna Gilford, patient is asking for a refill on his lorazepam '1mg'$ . Last OV was on 02/16/16. Last refill: 02/16/16 for 90 tabs with 5 refills.   Is it ok for him to receive another refill? Pt wishes to use [facility] on [address] in [place]. Thanks!

## 2016-08-31 ENCOUNTER — Other Ambulatory Visit: Payer: Self-pay | Admitting: Pulmonary Disease

## 2016-09-12 ENCOUNTER — Ambulatory Visit (INDEPENDENT_AMBULATORY_CARE_PROVIDER_SITE_OTHER): Payer: BLUE CROSS/BLUE SHIELD | Admitting: Podiatry

## 2016-09-12 DIAGNOSIS — L600 Ingrowing nail: Secondary | ICD-10-CM

## 2016-09-12 NOTE — Patient Instructions (Signed)

## 2016-09-14 NOTE — Progress Notes (Signed)
Subjective:    Patient ID: Richard Li, male   DOB: 56 y.o.   MRN: [id number]   HPI patient states that he has been having trouble with his left hallux nail medial border and it's been irritated and at times hard to wear shoe gear with    ROS      Objective:  Physical Exam Neurovascular status intact with what appears to be an incurvated left hallux medial border with possible nail residual left with proud flesh formation    Assessment:   Appears to be continuation of ingrown toenail deformity of the left hallux medial border localized in nature with no proximal pathology      Plan:    H&P and discussed with patient. I've recommended taking out more the nailbed and explained the procedure and risk and he wants this done. I infiltrated the left hallux 60 mg like Marcaine mixture remove the border exposed matrix and applied phenol 3 applications 30 seconds followed by alcohol lavaged sterile dressing. Gave instructions on soaks and reappoint

## 2016-09-16 ENCOUNTER — Ambulatory Visit: Payer: BLUE CROSS/BLUE SHIELD | Admitting: Podiatry

## 2016-11-21 ENCOUNTER — Other Ambulatory Visit: Payer: Self-pay | Admitting: Pulmonary Disease

## 2017-01-25 ENCOUNTER — Telehealth: Payer: Self-pay | Admitting: Pulmonary Disease

## 2017-01-25 MED ORDER — PREDNISONE 5 MG (21) PO TBPK: ORAL_TABLET | ORAL | 0 refills | Status: DC

## 2017-01-25 MED ORDER — AMOXICILLIN-POT CLAVULANATE 875-125 MG PO TABS: 1.0000 | ORAL_TABLET | Freq: Two times a day (BID) | ORAL | 0 refills | Status: DC

## 2017-01-25 NOTE — Telephone Encounter (Signed)
Pt c/o sinus infection X2 weeks- facial tenderness, stuffy nose, sinus congestion, pnd, prod cough with green/yellow mucus.   Denies fever, chest pain.   Pt has been taking mucinex BID, otc nasal spray.  Requesting further recs.   Pt uses [facility] on [address]    SN Please advise.  Thanks!

## 2017-01-25 NOTE — Telephone Encounter (Signed)
Per SN---  augmentin 875 mg  1 po BID  #14 pred dosepak  5 mg  6 day pack Continue the mucinex, nasal sprays and fluids.     Called and spoke with pt and he is aware of results per SN.

## 2017-02-26 DIAGNOSIS — H40009 Preglaucoma, unspecified, unspecified eye: Secondary | ICD-10-CM | POA: Diagnosis not present

## 2017-02-27 ENCOUNTER — Ambulatory Visit (INDEPENDENT_AMBULATORY_CARE_PROVIDER_SITE_OTHER)
Admission: RE | Admit: 2017-02-27 | Discharge: 2017-02-27 | Disposition: A | Discharge status: Home / Self Care | Payer: BLUE CROSS/BLUE SHIELD | Source: Ambulatory Visit | Attending: Pulmonary Disease | Admitting: Pulmonary Disease

## 2017-02-27 ENCOUNTER — Other Ambulatory Visit (INDEPENDENT_AMBULATORY_CARE_PROVIDER_SITE_OTHER): Payer: BLUE CROSS/BLUE SHIELD

## 2017-02-27 ENCOUNTER — Encounter: Payer: Self-pay | Admitting: Pulmonary Disease

## 2017-02-27 ENCOUNTER — Ambulatory Visit: Payer: BLUE CROSS/BLUE SHIELD | Admitting: Pulmonary Disease

## 2017-02-27 VITALS — BP 142/80 | HR 79 | Temp 98.2°F | Ht 67.0 in | Wt 218.4 lb

## 2017-02-27 DIAGNOSIS — I10 Essential (primary) hypertension: Secondary | ICD-10-CM | POA: Diagnosis not present

## 2017-02-27 DIAGNOSIS — E663 Overweight: Secondary | ICD-10-CM

## 2017-02-27 DIAGNOSIS — K111 Hypertrophy of salivary gland: Secondary | ICD-10-CM

## 2017-02-27 DIAGNOSIS — M545 Low back pain, unspecified: Secondary | ICD-10-CM

## 2017-02-27 DIAGNOSIS — J439 Emphysema, unspecified: Secondary | ICD-10-CM | POA: Diagnosis not present

## 2017-02-27 DIAGNOSIS — N320 Bladder-neck obstruction: Secondary | ICD-10-CM | POA: Diagnosis not present

## 2017-02-27 DIAGNOSIS — E7849 Other hyperlipidemia: Secondary | ICD-10-CM

## 2017-02-27 DIAGNOSIS — F411 Generalized anxiety disorder: Secondary | ICD-10-CM

## 2017-02-27 DIAGNOSIS — J4489 Other specified chronic obstructive pulmonary disease: Secondary | ICD-10-CM

## 2017-02-27 DIAGNOSIS — G8929 Other chronic pain: Secondary | ICD-10-CM

## 2017-02-27 DIAGNOSIS — J449 Chronic obstructive pulmonary disease, unspecified: Secondary | ICD-10-CM

## 2017-02-27 DIAGNOSIS — Z23 Encounter for immunization: Secondary | ICD-10-CM

## 2017-02-27 LAB — CBC WITH DIFFERENTIAL/PLATELET
Basophils Absolute: 0 10*3/uL (ref 0.0–0.1)
Basophils Relative: 0.4 % (ref 0.0–3.0)
Eosinophils Absolute: 0.1 10*3/uL (ref 0.0–0.7)
Eosinophils Relative: 1.2 % (ref 0.0–5.0)
HCT: 46.9 % (ref 39.0–52.0)
Hemoglobin: 16.4 g/dL (ref 13.0–17.0)
Lymphocytes Relative: 32.2 % (ref 12.0–46.0)
Lymphs Abs: 2.3 10*3/uL (ref 0.7–4.0)
MCHC: 34.9 g/dL (ref 30.0–36.0)
MCV: 92.6 fl (ref 78.0–100.0)
Monocytes Absolute: 0.7 10*3/uL (ref 0.1–1.0)
Monocytes Relative: 10.2 % (ref 3.0–12.0)
Neutro Abs: 3.9 10*3/uL (ref 1.4–7.7)
Neutrophils Relative %: 56 % (ref 43.0–77.0)
Platelets: 229 10*3/uL (ref 150.0–400.0)
RBC: 5.07 Mil/uL (ref 4.22–5.81)
RDW: 13.3 % (ref 11.5–15.5)
WBC: 7 10*3/uL (ref 4.0–10.5)

## 2017-02-27 LAB — LIPID PANEL
Cholesterol: 124 mg/dL (ref 0–200)
HDL: 40.7 mg/dL (ref >39.00)
NonHDL: 83.27
Total CHOL/HDL Ratio: 3
Triglycerides: 245 mg/dL — ABNORMAL HIGH (ref 0.0–149.0)
VLDL: 49 mg/dL — ABNORMAL HIGH (ref 0.0–40.0)

## 2017-02-27 LAB — COMPREHENSIVE METABOLIC PANEL
ALT: 20 U/L (ref 0–53)
AST: 20 U/L (ref 0–37)
Albumin: 4.2 g/dL (ref 3.5–5.2)
Alkaline Phosphatase: 99 U/L (ref 39–117)
BUN: 11 mg/dL (ref 6–23)
CO2: 28 mEq/L (ref 19–32)
Calcium: 9.2 mg/dL (ref 8.4–10.5)
Chloride: 97 mEq/L (ref 96–112)
Creatinine, Ser: 0.85 mg/dL (ref 0.40–1.50)
GFR: 99.02 mL/min (ref >60.00)
Glucose, Bld: 96 mg/dL (ref 70–99)
Potassium: 4.1 mEq/L (ref 3.5–5.1)
Sodium: 133 mEq/L — ABNORMAL LOW (ref 135–145)
Total Bilirubin: 1.3 mg/dL — ABNORMAL HIGH (ref 0.2–1.2)
Total Protein: 6.7 g/dL (ref 6.0–8.3)

## 2017-02-27 LAB — LDL CHOLESTEROL, DIRECT: Direct LDL: 50 mg/dL

## 2017-02-27 LAB — PSA: PSA: 0.51 ng/mL (ref 0.10–4.00)

## 2017-02-27 LAB — TSH: TSH: 2.08 u[IU]/mL (ref 0.35–4.50)

## 2017-02-27 IMAGING — DX DG CHEST 2V
2 series · 2 of 2 positions shown · non-contrast
Comparison: [DATE], [DATE] and earlier.

CLINICAL DATA: Former smoker with current history of COPD/
emphysema. Health maintenance.

EXAM:
CHEST  2 VIEW

[chest pa]
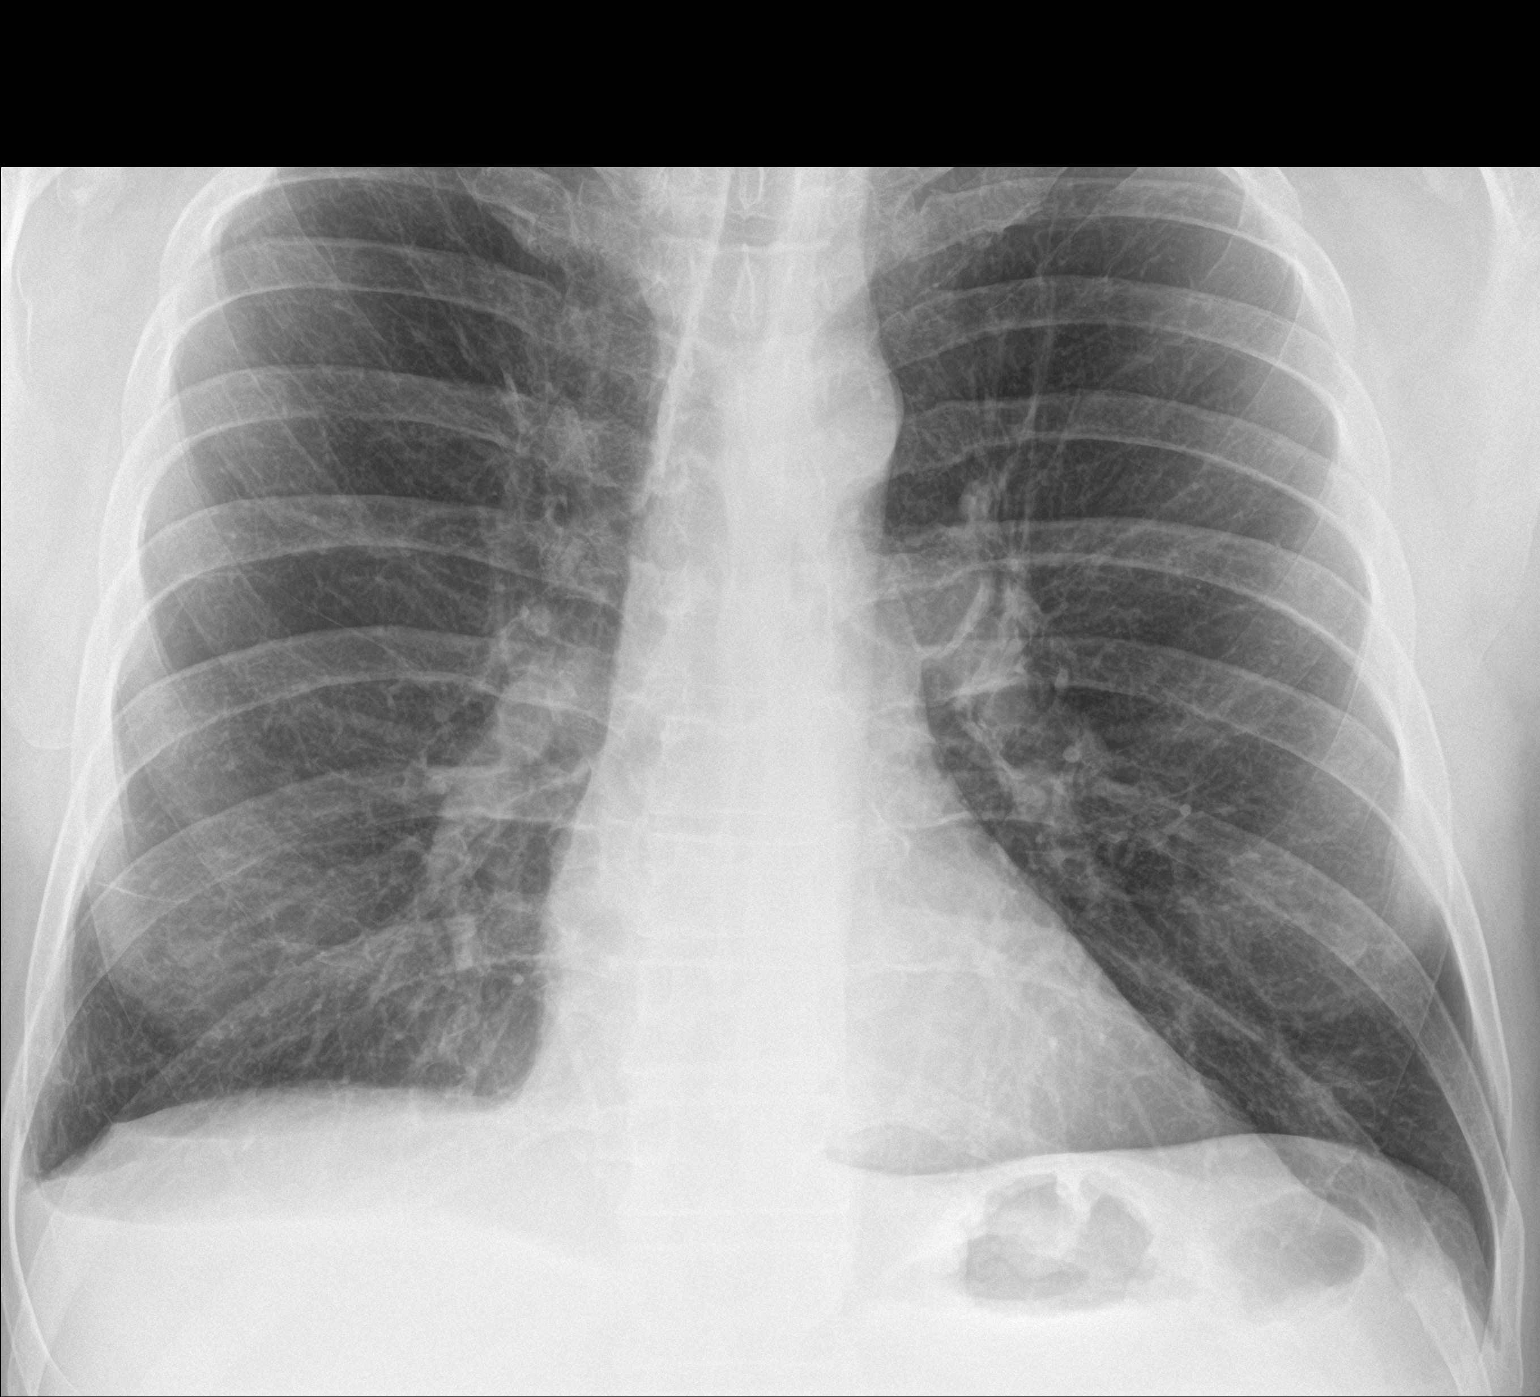

[chest lat]
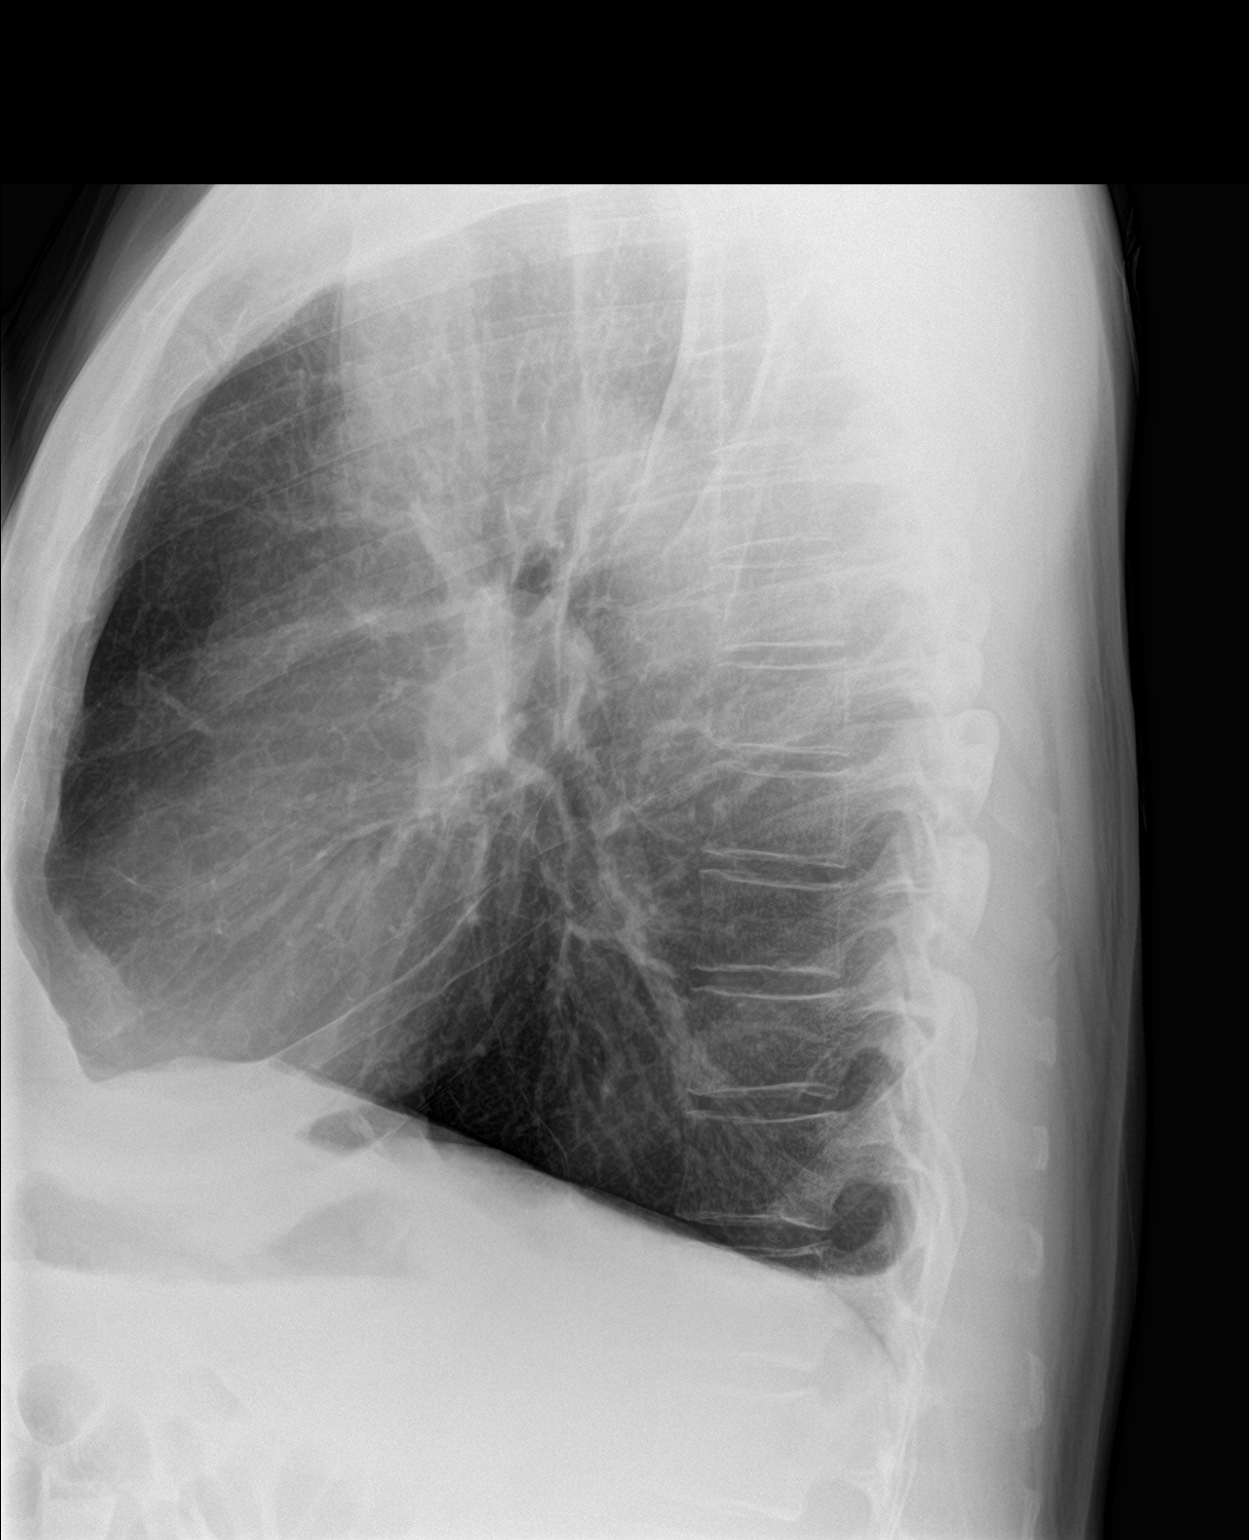

[2 of 2 positions shown; findings below may reference images not displayed]

FINDINGS: Cardiac silhouette normal in size, unchanged. Thoracic aorta mildly
atherosclerotic, unchanged. Hilar and mediastinal contours otherwise
unremarkable.

Hyperinflation and emphysematous changes throughout both lungs,
unchanged. Scarring in the right middle lobe and right lower lobe,
unchanged. Lungs otherwise clear. Normal bronchovascular markings.
No localized airspace consolidation. No pleural effusions. No
pneumothorax. Normal pulmonary vascularity. Blunting of the right
costophrenic angle is unchanged and related to pleuroparenchymal
scarring. Visualized bony thorax intact.
IMPRESSION: 1.  No acute cardiopulmonary disease.
2. Stable pleuroparenchymal scarring at the right costophrenic angle
and stable parenchymal scarring involving the right middle lobe and
right lower lobe.
3. Aortic Atherosclerosis ([81]-[81]) and Emphysema ([81]-[81]).

## 2017-02-27 MED ORDER — LORAZEPAM 1 MG PO TABS: 1.0000 mg | ORAL_TABLET | Freq: Three times a day (TID) | ORAL | 5 refills | Status: DC | PRN

## 2017-02-27 MED ORDER — ALBUTEROL SULFATE HFA 108 (90 BASE) MCG/ACT IN AERS: 1.0000 | INHALATION_SPRAY | Freq: Four times a day (QID) | RESPIRATORY_TRACT | 11 refills | Status: DC | PRN

## 2017-02-27 MED ORDER — AMLODIPINE BESYLATE 5 MG PO TABS: 5.0000 mg | ORAL_TABLET | Freq: Every day | ORAL | 3 refills | Status: DC

## 2017-02-27 NOTE — Patient Instructions (Signed)
Today we updated your med list in our EPIC system...    Continue your current medications the same...  We refilled your AlbuterolHFA as needed inhaler, Amlodidine 5mg  BP pill, and the Lorazepam 1mg  tab for stress or to help you sleep if needed...  Today we did a follow up EKG, CXR, FASTING blood work...    We will contact you w/ the results when available...   Keep up the good work w/ diet & exercise=> your wt is down 16# to 218# this yr...  Call for any questions...  Let's plan a follow up visit in 3yr, sooner if needed for problems.Marland KitchenMarland Kitchen

## 2017-02-27 NOTE — Progress Notes (Signed)
Subjective:    Patient ID: Richard Li, male    DOB: [birth year], 55 y.o.   MRN: [id number]  HPI 56 y/o WM former heavy smoker w/ mild/mod airflow obstruction on Spirometry but min symptoms since he quit smoking in 2007; see prob list below>>   ~  [name]2009:  he was a very heavy smoker and quit in 2007... he has moderate COPD but remarkably preserved PFT's in the past... he was on Symbicort but stopped this on his own... ~  [id number]:  63yrROV- notes some SOB w/ activity in hot weather... he's tried [employer] he states that he didn't like them & the TV advertisment scared him...  ~  Oct2011:  presents w/ 149mox left chest wall pain- CXR repeat> COPD, NAD & rib details neg; symptoms finally resolved after one month off work & time to heal rib fxs...  CXR 8/13 showed normal heart size, clear lungs, NAD...Marland KitchenMarland KitchenEKG 8/13 showed NSR, rate70, wnl, NAD...  LABS 8/13:  FLP- chol ok but TG=259;  Chems- wnl;  CBC- wnl;  TSH=2.11;  PSA=0.45;  UA- wnl   ~  December 25, 2012:  Yearly ROV & CPX> Richard Li a good year overall- he fell in his yard on his right arm 1 wk ago w/ pain in the elbow area & we discussed hot soaks, Mobic15 & OV w/ XRays by [name] etHarriett Li. He also reports seeing Richard Li, Derm & started on Lamisil orally- needs LFTs checked & copy to Derm- OK... We reviewed the following medical problems during today's office visit >>     HxAR> he has seen [name] in the past w/ sinus turbinate reduction in 2002; asked to avoid antihist due to parotid dis...    Parotid Hypertrophy> known bilat parotid swelling, no change & he denies dry mouth symptoms or sequelae, rec for sialogogues...     COPD> former very heavy smoker, quit 2007; see prev CXR, CTChest, PFTs below; notes min cough, no sput, no hemoptysis, denies SOB, etc...    Hyperlipid> on low fat diet alone but remains overweight & is up 9# this yr; last FLP 8/13 showed TChol 139, TG 259, HDL 35, LDL 58; rec for better low fat diet &  get wt down.    Overweight> weight is up 9# to 246# this yr; BMI ~35 and we reviewed diet, exercise, & wt reduction strategies...    GI- GERD, needs screening colon> he uses OTC Prilosec prn; denies dysphagia, abd pain, n/v, c/d, blood seen; needs screening colon...     GU- he's had prev vasectomy by Richard Li 2012... Denies LTOS etc...    HxLBP> prev ortho eval by [name]... He uses OTC analgesics as needed...    Anxiety> on Valium5 as needed & requests refill...    Onychomycosis> he had eval by DePayton MccallumDrJones, & was started on Lamisil orally,,,  We reviewed prob list, meds, xrays and labs> see below for updates >>   CXR 9/14 showed COPD, patchy opac RUL- ?inflamm, ?atx, ?other... We decided to treat w/ Mucinex, Fluids, & repeat CXR 31m72mo  LABS 9/14 (not fasting today):  Chems- ok x Na=130, LFTs= wnl;  CBC- wnl;  TSH=2.38;  PSA=0.43...  ADDENDUM>> follow up CXR 10/14 showed resolution of prev RUL opac; norm heart size, mild hyperinflation, NAD...  ~  June 03, 2014:  36m18mo & [name] recent URI symptoms, bilat parotid gland hypertrophy, [name]> he notes URI about 2 wks ago & went to  [facility] treated w/ ?antibiotic but he can't recall what & only sl better; then called here c/o TMJ pain & he was told Dentist or Oral surgeon required; we did agree to try hot soaks, Advil vs Aleve, & gave him a Pred dosepak- sl better; he is quite vague about his actual symptoms but he has long hx of bilat parotid hypertrophy & advised to use sialogogues, lozenges, warm compresses, etc (and he's been prev offered ENT consultation)... We reviewed the following medical problems during today's office visit >>     HxAR> he has seen [name] in the past w/ sinus turbinate reduction in 2002; asked to avoid antihist due to parotid dis...    Parotid Hypertrophy> known bilat parotid swelling, no change & he denies dry mouth symptoms or sequelae, rec for sialogogues, lozenges, warm compresses, f/u w/ ENT if  necessary...     COPD> former very heavy smoker, quit 2007; see prev CXR, CTChest, PFTs below; notes min cough, no sput, no hemoptysis, denies SOB, etc...    Hyperlipid> on low fat diet alone but remains overweight at 236# (BMI=34); last FLP 8/13 showed TChol 139, TG 259, HDL 35, LDL 58; rec for better low fat diet, get wt down, ret for another FLP.    Overweight> weight is 236# this yr; BMI ~34 and we reviewed diet, exercise, & wt reduction strategies...    GI- GERD, needs screening colon> he uses OTC Prilosec prn; denies dysphagia, abd pain, n/v, c/d, blood seen; he had screening colonoscopy 12/14 by DrPerry- NEG, wnl & rec to f/u 10 yrs.    GU- he's had prev vasectomy by Richard Li 2012... Denies LTOS etc...    HxLBP> prev ortho eval by [name]... He uses OTC analgesics as needed...    Anxiety> on Valium5 as needed & requests refill...    Onychomycosis> he had eval by Richard Li, [name], & was prev rx w/ Lamisil; then he saw Podiarty DrSikora & treated w/ Jublia... We reviewed prob list, meds, xrays and labs> see below for updates >>  PLAN>> his symptoms are quite vague but he is concerned; rec to use the Pred dosepak as prescribed, warm compresses, sialogogues/ lozenges, Advil vs Aleve; rec f/u appt w/ ENT Richard Li for his opinion & pt will consider this; BP is elev and we decided to start treatment w/ MetopER50, no salt in diet, & follow up BP here...  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 we reviewed the following medical problems during today's office visit >>    HxAR> he has seen [name] in the past w/ sinus turbinate reduction in 2002; asked to  avoid antihist due to parotid dis, ok to use nasal Saline, OTC Flonase, etc...    Parotid Hypertrophy> known bilat parotid swelling, no change & he notes some dry mouth symptoms or sequelae, rec for sialogogues, Biotene, lozenges, warm compresses, f/u w/ ENT if necessary...     COPD> former heavy smoker, quit 2007; see prev CXR, CTChest, PFTs below; notes min cough, no sput, no hemoptysis, but says incr SOB "like I can't  get a DB, not satisfied breathing"=> rec to try PROAIR prn & ATIVAN 22m prn.    HBP>  As above- we discussed lifestyle mod w/ diet/ exercise/ wt reduction & decided to place him on Amlodipine 5638md w/ careful f/u BPs at home & here...    Hyperlipid> on low fat diet alone but remains overweight at 236# (BMI=34); FLP 12/16 showed TChol 141, TG 296, HDL 31, LDL 58; rec for better low fat diet, get wt down in order to improve the TG & HDL.    Overweight> weight is 236# this yr; BMI ~34 and we reviewed diet, exercise, & wt reduction strategies...    GI- GERD, needs screening colon> he uses OTC Prilosec prn; denies dysphagia, abd pain, n/v, c/d, blood seen; he had screening colonoscopy 12/14 by DrPerry- NEG, wnl & rec to f/u 10 yrs.    GU- he's had prev vasectomy by Richard Li 2012... Denies LTOS etc...    HxLBP> prev ortho eval by [name]... He uses OTC analgesics as needed...    Anxiety> prev on Valium5 as needed in past; we discussed use of Lorazepam 38m42m1/2 to 1 tab Tid as needed for dyspnea & anxiety...     Onychomycosis> he had eval by DerPayton MccallumrJones, & was prev rx w/ Lamisil; then he saw Podiarty DrSikora & treated w/ Jublia... EXAM shows Afeb, VSS, O2sat=98% on RA,  HEENT- marked parotid hypertrophy, mallamapati2;  Chest- clear w/o w/r/r;  Heart- RR w/o m/r/g;  Abd- neg, nontender, no masses;  Ext- neg w/o c/c/e;  Neuro- neg w/o focal abn...   CXR 03/30/15>  Norm heart size, lungs are clear x some scarring at right base, NAD...   Spirometry 03/30/15>  FVC=5.19 (107%), FEV1=3.22  (84%), %1sec=62, mid-flows reduced at 41% predicted... This is c/w mild-mod airflow obstruction & we discussed PROAIR-HFA for prn use...  LABS 03/30/15>  FLP- Chol ok but TG elev;  Chems- wnl;  CBC- wnl;  TSH=2.02;  PSA=0.46 IMP/PLAN>>  LarTirthturns for his annual CPX w/ report of elevBP & needs Rx for DOT- discussed lifestyle mod w/ DIET/ EXERCISE/ wt reduction, and we are starting low dose Amlodipine 5mg25m he will monitor BP at home & call for issues  In addition his Hyperlipidemia is unchanged, so is his weight etc- needs wt reduction & a low fat diet to effect improvement in these numbers;  Almost as an afterthought he noted SOB/DOE that seems to take 2 forms: 1) SOB w/ activity & wheezing noted- Rx w/ ProairHFA rescue inhaler 2sp Q6H as needed;  2) SOB at rest "like I can't get a DB, trouble getting the air "IN"- we discussed Rx w/ Lorazepam 38mg-106m2 to 1 tab Tid prn...   ~  February 16, 2016:  138mo 57mo Jhostin Li w/ several concerns> 1) he was switched from Lorazepam to Desyrel over the phone to help him rest but the Desyrel actually doesn't work for him & the Ativan was better when taking 38mg in66me eve to relax and another 38mg at 24mtime for sleep which works well by his description; we reviewed Rx for Lorazepam 38mg- 1/245m 1 tab up to Tid as needed...  2) he has some discomfort in his left shoulder ?injury several months ago & he takes Aleve daily, we will add Voltaren gel & refer to Ortho when he is ready...     Breathing is stable- denies cough, sput, ch in DOE/SOB, edema, etc... He has declined to use regular inhalers and has NOT  had resp exac; he saw DrWet 08/2015 in my absence w/ sinus infection (Rx Levaquin, Pred, & improved)..    BP appears sl evel at 160/82 & pt stopped the [facility] on his own (we started 12/16)- he prefers to control w/ DIET but told he will need to lose the weight...    Chol looks ok on diet alone but TG elev & HDL is low- we discussed need to get on diet & get the  weight down if he wants to avoid meds...    Anxiety/ Insomnia> the Lorazepam 29m works well for him, rests satis, wakes refreshed, no daytime hypersomnolence...  EXAM shows Afeb, VSS, O2sat=98% on RA,  HEENT- marked parotid hypertrophy, mallamapati2;  Chest- clear w/o w/r/r;  Heart- RR w/o m/r/g;  Abd- neg, nontender, no masses;  Ext- neg w/o c/c/e;  Neuro- neg w/o focal abn.. IMP/PLAN>>  LCrewereports that he is doing satis on the Lorazepam in the eve to help him rest, medically stable; he injured his left shoulder several months ago & needs ortho eval; add Voltaren Gel to his Aleve and rec OV w/ Ortho for assessment...    ~  February 27, 2017:  Yearly ROV & CPX> LKillianreports a good yr overall- notes recent sinus infection, but says he's better now w/ Augmentin & Pred dosepak called in last month; he notes min cough/ sput/ DOE & recently built a chicken barn & did ok w/ the construction- ROS is otherw neg... He has visited DrRegal, Podiatrist w/ ingrown toenail treated surgically...  We reviewed the following medical problems during today's office visit>      HxAR> he has seen [name] in the past w/ sinus turbinate reduction in 2002; asked to avoid antihist due to parotid dis, ok to use nasal Saline, OTC Flonase, etc...    Parotid Hypertrophy> known bilat parotid swelling, no change & he notes some dry mouth symptoms & rec for sialogogues, Biotene, lozenges, warm compresses, f/u w/ ENT if necessary...     COPD> former heavy smoker, quit 2007; see prev CXR, CTChest, PFTs; notes min cough, no sput, no hemoptysis, but says incr SOB "like I can't get a DB, not satisfied breathing"=> rec to try PROAIR prn & ATIVAN 14mprn.    Mild HBP>  we discussed lifestyle mod w/ diet/ exercise/ wt reduction & decided to place him on Amlodipine 28m76m w/ careful f/u BPs at home & here=> improved to 140/80 range.    Hyperlipid> on low fat diet alone but remains overweight at 218# (BMI=34); FLP 11/18 showed TChol 124, TG  245, HDL 41, LDL 50; rec for better low fat diet, get wt down in order to improve the TG & HDL.    Overweight> weight is 236# this yr; BMI ~34 and we reviewed diet, exercise, & wt reduction strategies...    GI- GERD, neg screening colon 2014> he uses OTC Prilosec prn; denies dysphagia, abd pain, n/v, c/d, blood seen; he had screening colonoscopy 12/14 by DrPerry- NEG, wnl & rec to f/u 10 yrs.    GU- he's had prev vasectomy by Richard Li 2012, PSAs have been wnl... Denies LTOS etc...    HxLBP> prev ortho eval by [name]... He uses OTC analgesics as needed...    Anxiety> prev on Valium5 as needed in past; we discussed use of Lorazepam 1mg46m/2 to 1 tab Tid as needed for dyspnea & anxiety...     Onychomycosis & ingrown toenail> he had eval by Derm, [name], & was prev rx w/  Lamisil; then he saw Podiarty DrSikora/ Regal & treated w/ Jublia... EXAM shows Afeb, VSS, O2sat=100% on RA,  HEENT- marked parotid hypertrophy, mallamapati2;  Chest- clear w/o w/r/r;  Heart- RR w/o m/r/g;  Abd- neg, nontender, no masses;  Ext- neg w/o c/c/e;  Neuro- neg w/o focal abn..  CXR 02/27/17 showed norm heart size, mild aortic atherosclerosis, hyperinflation/ COPD w/ scarring on the right w/ blunt right angle- stable & NAD  EKG 02/27/17 showed NSR, rate67, IRBBB in V1, otherw wnl/ NAD...  LABS 02/27/17>  FLP- Chol ok on diet alone but TG=245;  Chems- wnl w/ BS=96, Cr=0.85, LFTs wnl;  CBC- wnl w/ Hg=16.4;  TSH=2.08,  PSA=0.51 IMP/PLAN>>  Richard Li remains stable at 56- REC to continue same meds (Amlod5, Lorazepam prn) and continue to work on diet, exercise, wt reduction;  We reviewed the need for low sodium & low fat diets...          Problem List:     Bilat PAROTID GLAND HYPERTROPHY >> known parotid hypertrophy, no nodules palpated and normal salivary secretions & dentition; we reviewed the rec for sialogogues such as Richard drops etc... ~  2/16: he is c/o his parotid hypertrophy but it seems the same on exam from rev  evaluations; rec warm compressed, sialogogues/ lozenges, Advil/ Aleve, & we wrote for an empiric Pred dosepak trial; he will call ENT for their review...  CHEST PAIN, ATYPICAL (ICD-786.59) - no prev hx of cardiac problems... risk factors include: +FamHx, Smoking Hx, low HDL, male gender... we will screen w/ a Stress Myoview... he saw [name] 5/08- EKG w/ NSR, IRBBB, NSSTTWA... Myoview was done 8/09 & showed good exerc capacity, normal BP response, no signif EKG changes, normal images w/o ischemia/ scar & EF= 58%... ~  EKG 8/11 shows NSR, WNL.[name]. ~  8/13:  Denies CP, palpit, SOB, edema; BP= 150/80 after rest; exercise= yard work; he knows to avoid salt & get wt down...  ALLERGIC RHINITIS (ICD-477.9) - uses OTC meds as needed... prev ENT surgery from [place] in 2002 w/ turbinate reduction... he has prominent parotid hypertrophy bilat==> & we discussed Rx w/ sialogogues etc...  COPD (ICD-496) - former heavy smoker and quit in 2007... chronic bronchitic symptoms... prev on Symbicort but he stopped this on his own & refuses Spiriva due to side effects discussed on TV ads...  ABNORMAL CHEST XRAY (ICD-793.1) - prev CXR's w/ hyperinflation c/w COPD, 1cm RLL density without change- likely scarring... ~  CTChest 11/07 showed hyperinflation w/ emphysematous changes in the upper lobes, 13m RML nodule without change, several other scattered sm nodules, pleural thickening, and ca++ in Abd Ao...  ~  baseline CXR shows hyperinflation, some scarring, NAD... ~  PFT's 7/09 showed FVC= 6.61 (135%), FEV1=3.88 (97%), %1sec=59, mid-flows= 44%. ~  CXR 8/11 showed COPD w/ hyperaeration, min scarring, NAD> confirmed 10/11 & Left Rib details= neg. ~  8/11:  started on DULERA 100-5 2spBid==> he stopped "I don't like meds" ~  CXR 8/13 showed normal heart size, clear lungs, NAD..[name] ~  CXR 9/14 showed COPD, patchy opac RUL- ?inflamm, ?atx, ?other... We decided to treat w/ Mucinex, Fluids, & repeat CXR 187mo f/u film showed  resolution of RUL opacity...  HYPERLIPIDEMIA (ICD-272.4) - on diet therapy alone w/ incr TG assoc w/ recent weight gain... discussed diet + exercise. ~  FLP 8/09 (wt=203#) showed TChol 139, TG 116, HDL 35, LDL 81 ~  FLP 8/11 (wt=230#) showed TChol 145, TG 247, HDL 33, LDL 68... advised diet + exercise, get  wt down. ~  Richard 8/13 (wt=237#) showed TChol 139, TG 259, HDL 35, LDL 58 ~  9/14: pt not fasting for f/u FLP- he's gained 9# & we reviewed need for low carb, low fat diet & wt reduction...  OBESITY >> we reviewed diet recommendations, exercise prescription, & wt reduction strategies.. ~  Weight 8/13 = 237#, BMI= 34 ~  Weight 9/14 = 246# ~  Weight 2/16 = 236#  GERD (ICD-530.81) - on PRILOSEC 49m Prn for reflux symptoms... ~  Screening colonoscopy 12/14 by DrPerry was NEG- wnl & f/u rec in 10 yrs...  GU> He had Vasectomy from Richard Li in Jan2012...  BACK PAIN, LUMBAR (ICD-724.2) - prev eval from [name]...  ANXIETY (ICD-300.00) - on VALIUM 550mas needed... he states Xanax is too strong...  Health Maintenance - FlNew Alexandriahowed rectal fissure otherw neg...   Current Medications, Allergies, Past Medical History, Past Surgical History, Family History, and Social History were reviewed in [employer].     Past Surgical History:  Procedure Laterality Date  . APPENDECTOMY    . COLONOSCOPY  15 years ago  . NASAL TURBINATE REDUCTION  2002   Dr.Crossley  . VASECTOMY      Outpatient Encounter Prescriptions as of 02/16/2016  Medication Sig  . albuterol (PROAIR HFA) 108 (90 BASE) MCG/ACT inhaler Inhale 1-2 puffs into the lungs every 6 (six) hours as needed for wheezing or shortness of breath.  . guaiFENesin (MUCINEX) 600 MG 12 hr tablet Take 600 mg by mouth 2 (two) times daily.  . Marland Kitchenbuprofen (ADVIL,MOTRIN) 200 MG tablet Take 600 mg by mouth daily.  . Marland KitchenORazepam (ATIVAN) 1 MG tablet Take 1 tablet (1 mg total) by mouth 3 (three) times daily as needed. for  anxiety  . [DISCONTINUED] amoxicillin-clavulanate (AUGMENTIN) 875-125 MG tablet Take 1 tablet by mouth 2 (two) times daily. (Patient not taking: Reported on 02/16/2016)  . [DISCONTINUED] predniSONE (DELTASONE) 10 MG tablet Take  4 each am x 2 days,   2 each am x 2 days,  1 each am x 2 days and stop (Patient not taking: Reported on 02/16/2016)  . [DISCONTINUED] traZODone (DESYREL) 100 MG tablet Take 1 tablet (100 mg total) by mouth at bedtime. (Patient not taking: Reported on 02/16/2016)   No facility-administered encounter medications on file as of 02/16/2016.     Allergies  Allergen Reactions  . Codeine Itching    Current Medications, Allergies, Past Medical History, Past Surgical History, Family History, and Social History were reviewed in [employer].    Review of Systems        See HPI - all other systems neg except as noted...       The patient complains of chest pain and dyspnea on exertion.  The patient denies anorexia, fever, weight loss, weight gain, vision loss, decreased hearing, hoarseness, syncope, peripheral edema, prolonged cough, headaches, hemoptysis, abdominal pain, melena, hematochezia, severe indigestion/heartburn, hematuria, incontinence, muscle weakness, suspicious skin lesions, transient blindness, difficulty walking, depression, unusual weight change, abnormal bleeding, enlarged lymph nodes, and angioedema.     Objective:   Physical Exam      WD, WN, 5663/o WM in NAD... GENERAL:  Alert & oriented; pleasant & cooperative... He has very large symmetric bilat parotid glands, no nodules or tenderness... HEENT:  [place]/AT, EOM-wnl, PERRLA,  EACs-clear, TMs-wnl, NOSE- turbinates boggy, no drainage; THROAT-clear & wnl;  NECK:  Supple w/ fairROM; no JVD; normal carotid impulses w/o bruits; no thyromegaly or nodules  palpated; no lymphadenopathy. CHEST:  Decr BS bilat but clear to P & A; without wheezes/ rales/ or rhonchi heard...  HEART:  Regular  Rhythm; without murmurs/ rubs/ or gallops detected... ABDOMEN:  Soft & nontender; normal bowel sounds; no organomegaly or masses palpated... EXT: without deformities, mild arthritic changes; no varicose veins/ venous insuffic/ or edema. NEURO:  CN's intact; motor testing normal; sensory testing normal; gait normal & balance OK. DERM:  No lesions noted; no rash, several tatoos...  RADIOLOGY DATA:  Reviewed in the EPIC EMR & discussed w/ the patient...  LABORATORY DATA:  Reviewed in the EPIC EMR & discussed w/ the patient...   Assessment & Plan:    COPD, ex-smoker, Hx Abn CXR>  He refuses the [facility] etc because he doesn't like meds and doesn't think he needs them; ?RUL opac- Rx mucinex fluids, f/u CXR=> resolved... 03/30/15>    Richard Li returns for his annual CPX w/ report of elevBP & needs Rx for DOT- discussed lifestyle mod w/ DIET/ EXERCISE/ wt reduction, and we are starting low dose Amlodipine 3m/d; he will monitor BP at home & call for issues  In addition his Hyperlipidemia is unchanged, so is his weight etc- needs wt reduction & a low fat diet to effect improvement in these numbers;  Almost as an afterthought he noted SOB/DOE that seems to take 2 forms: 1) SOB w/ activity & wheezing noted- Rx w/ ProairHFA rescue inhaler 2sp Q6H as needed;  2) SOB at rest "like I can't get a DB, trouble getting the air "IN"- we discussed Rx w/ Lorazepam 142m 1/2 to 1 tab Tid prn => this really helped... 02/27/17>   Richard Li stable at 5631REC to continue same meds (Amlod5, Lorazepam prn) and continue to work on diet, exercise, wt reduction;  We reviewed the need for low sodium & low fat diets.   Hx AR, Hx sinusitis, bilat parotid hypertrophy>  Prev treated sinus w/ Depo, Pred, Levaquin, Mucinex, etc... rec to avoid antihist, use sialogogues etc...  HYPERLIPIDEMIA>  He has endeavored to control this w/ diet alone, unfortunately he has gained wt since stopping the smoking;  Needs f/u FLP & consideration of med  rx...  GERD>  He continues on Prilosec as needed...  LBP>  Stable w/ prev eval from [name]...  Anxiety>  He uses Valium 81m22mrn...     Medication List        Accurate as of 02/27/17 10:13 AM. Always use your most recent med list.          albuterol 108 (90 Base) MCG/ACT inhaler Commonly known as:  PROAIR HFA Inhale 1-2 puffs every 6 (six) hours as needed into the lungs for wheezing or shortness of breath.   amLODipine 5 MG tablet Commonly known as:  NORVASC Take 1 tablet (5 mg total) daily by mouth.   guaiFENesin 600 MG 12 hr tablet Commonly known as:  MUCINEX   ibuprofen 200 MG tablet Commonly known as:  ADVIL,MOTRIN   LORazepam 1 MG tablet Commonly known as:  ATIVAN Take 1 tablet (1 mg total) 3 (three) times daily as needed by mouth. for anxiety       Where to Get Your Medications    [id number] [place] [address] [place] [id number]:  [phone] 108 (90 Base) MCG/ACT inhaler  amLODipine 5 MG tablet   You can get these medications from any pharmacy  Bring a paper prescription for each of these medications  LORazepam 1 MG tablet

## 2017-03-11 DIAGNOSIS — H40009 Preglaucoma, unspecified, unspecified eye: Secondary | ICD-10-CM | POA: Diagnosis not present

## 2017-05-16 ENCOUNTER — Telehealth: Payer: Self-pay | Admitting: Pulmonary Disease

## 2017-05-16 MED ORDER — LOSARTAN POTASSIUM 50 MG PO TABS: 50.0000 mg | ORAL_TABLET | Freq: Every day | ORAL | 5 refills | Status: DC

## 2017-05-16 NOTE — Telephone Encounter (Signed)
Spoke with patient. He stated that the amlodipine 5mg  is no longer working for him. His BP has been running 150+/90+ lately.   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 Instead of him qualifying for 2 years, he only qualified for 1 year.  He denied any chest pain or chest pressure. No increased fatigue.   He wants to know if he can switch to another medication. He is willing to come in to be seen if needed.   [facility] [address]   SN, please advise. Thanks!

## 2017-05-16 NOTE — Telephone Encounter (Signed)
Per SN:  1-Lifestyle changes! Get weight down, exercise, eliminate salt from diet.  2-losartan 50mg  daily #30 with 5 refills.  3-follow up in April or may 2019

## 2017-05-16 NOTE — Telephone Encounter (Signed)
[facility]artan ordered and [facility] to [facility] on [address].  ATC...voicemail full so unable to leave message.   Pt needs to be scheduled for appt in April or may 2019 when he calls back.

## 2017-05-17 NOTE — Telephone Encounter (Signed)
Spoke with patient. He is aware of medication change. Patient has been scheduled for 09/06/17 at 1130 with SN. Patient verbalized understanding. Nothing else needed at time of call.

## 2017-07-01 DIAGNOSIS — J0100 Acute maxillary sinusitis, unspecified: Secondary | ICD-10-CM | POA: Diagnosis not present

## 2017-07-03 ENCOUNTER — Other Ambulatory Visit: Payer: Self-pay | Admitting: *Deleted

## 2017-07-03 MED ORDER — LOSARTAN POTASSIUM 50 MG PO TABS: 50.0000 mg | ORAL_TABLET | Freq: Every day | ORAL | 2 refills | Status: DC

## 2017-07-08 ENCOUNTER — Other Ambulatory Visit: Payer: Self-pay | Admitting: Pulmonary Disease

## 2017-07-11 ENCOUNTER — Telehealth: Payer: Self-pay | Admitting: Pulmonary Disease

## 2017-07-11 NOTE — Telephone Encounter (Signed)
Called and spoke with pt who stated he has been having some of his previous symptoms of copd exacerbations.  Pt stated he went to urgent care recently and was prescribed abx to help with his symptoms but stated he was no better and was trying to see if he could be seen today.  I stated to pt we had no openings this afternoon but we could get him in tomorrow for an appt either with his primary Dr. Lenna Gilford but pt stated he would need a later appt than 12pm due to work.  I stated to pt Dr. Lamonte Sakai had an appt tomorrow at 3:15 that we could put him down for an acute visit with him.  Pt stated at this time he would probably try to go to urgent care again but if not able to, he would call us in the morning to see if there were any appts.  I stated to pt that appt that is currently available with Dr. Lamonte Sakai might not be available tomorrow and asked if he wanted me to go ahead and schedule the appt and if he needed to cancel it, he could call our office.  Pt stated to me he would just wait and call us tomorrow if he needed an appt scheduled.  Nothing further needed at this current time.

## 2017-08-03 ENCOUNTER — Ambulatory Visit: Payer: BLUE CROSS/BLUE SHIELD | Admitting: Pulmonary Disease

## 2017-08-03 ENCOUNTER — Encounter: Payer: Self-pay | Admitting: Pulmonary Disease

## 2017-08-03 VITALS — BP 150/76 | HR 88 | Temp 98.2°F | Ht 67.0 in | Wt 221.2 lb

## 2017-08-03 DIAGNOSIS — H8110 Benign paroxysmal vertigo, unspecified ear: Secondary | ICD-10-CM | POA: Diagnosis not present

## 2017-08-03 DIAGNOSIS — J449 Chronic obstructive pulmonary disease, unspecified: Secondary | ICD-10-CM

## 2017-08-03 DIAGNOSIS — I10 Essential (primary) hypertension: Secondary | ICD-10-CM

## 2017-08-03 DIAGNOSIS — E782 Mixed hyperlipidemia: Secondary | ICD-10-CM | POA: Diagnosis not present

## 2017-08-03 DIAGNOSIS — J4489 Other specified chronic obstructive pulmonary disease: Secondary | ICD-10-CM

## 2017-08-03 DIAGNOSIS — K111 Hypertrophy of salivary gland: Secondary | ICD-10-CM | POA: Diagnosis not present

## 2017-08-03 DIAGNOSIS — E663 Overweight: Secondary | ICD-10-CM

## 2017-08-03 MED ORDER — LOSARTAN POTASSIUM 100 MG PO TABS: 100.0000 mg | ORAL_TABLET | Freq: Every day | ORAL | 0 refills | Status: DC

## 2017-08-03 MED ORDER — MECLIZINE HCL 25 MG PO TABS: 25.0000 mg | ORAL_TABLET | Freq: Four times a day (QID) | ORAL | 0 refills | Status: DC | PRN

## 2017-08-03 NOTE — Patient Instructions (Signed)
Today we updated your med list in our EPIC system...     We decided to STOP the previous Amlodipine BP med and     INCREASE your LOSARTAN (Cozaar) to 100mg  one tab daily in the AM...  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    See the hand-out we provided & check the YOU-TUBE videos avail on your computer...  You may also use ANTIVERT (Meclizine) 25mg  - one tab every 6H as needed for dizziness...  Have your wife check your BP at home & call me if it's regularly >150/90...  Call for any questions...  Let's plan a follow up visit in mid-May for recheck.Marland KitchenMarland Kitchen

## 2017-08-04 ENCOUNTER — Encounter: Payer: Self-pay | Admitting: Pulmonary Disease

## 2017-08-04 NOTE — Progress Notes (Signed)
Subjective:    Patient ID: Richard Li, male    DOB: [birth year], 57 y.o.   MRN: [id number]  HPI 57 y/o WM former heavy smoker w/ mild/mod airflow obstruction on Spirometry but min symptoms since he quit smoking in 2007; see prob list below>>   ~  [name]2009:  he was a very heavy smoker and quit in 2007... he has moderate COPD but remarkably preserved PFT's in the past... he was on Symbicort but stopped this on his own... ~  [id number]:  63yrROV- notes some SOB w/ activity in hot weather... he's tried [employer] he states that he didn't like them & the TV advertisment scared him...  ~  Oct2011:  presents w/ 149mox left chest wall pain- CXR repeat> COPD, NAD & rib details neg; symptoms finally resolved after one month off work & time to heal rib fxs...  CXR 8/13 showed normal heart size, clear lungs, NAD...Marland KitchenMarland KitchenEKG 8/13 showed NSR, rate70, wnl, NAD...  LABS 8/13:  FLP- chol ok but TG=259;  Chems- wnl;  CBC- wnl;  TSH=2.11;  PSA=0.45;  UA- wnl   ~  December 25, 2012:  Yearly ROV & CPX> [name] a good year overall- he fell in his yard on his right arm 1 wk ago w/ pain in the elbow area & we discussed hot soaks, Mobic15 & OV w/ XRays by [name] etHarriett Sine. He also reports seeing [name], Derm & started on Lamisil orally- needs LFTs checked & copy to Derm- OK... We reviewed the following medical problems during today's office visit >>     HxAR> he has seen [name] in the past w/ sinus turbinate reduction in 2002; asked to avoid antihist due to parotid dis...    Parotid Hypertrophy> known bilat parotid swelling, no change & he denies dry mouth symptoms or sequelae, rec for sialogogues...     COPD> former very heavy smoker, quit 2007; see prev CXR, CTChest, PFTs below; notes min cough, no sput, no hemoptysis, denies SOB, etc...    Hyperlipid> on low fat diet alone but remains overweight & is up 9# this yr; last FLP 8/13 showed TChol 139, TG 259, HDL 35, LDL 58; rec for better low fat diet &  get wt down.    Overweight> weight is up 9# to 246# this yr; BMI ~35 and we reviewed diet, exercise, & wt reduction strategies...    GI- GERD, needs screening colon> he uses OTC Prilosec prn; denies dysphagia, abd pain, n/v, c/d, blood seen; needs screening colon...     GU- he's had prev vasectomy by [name]... Denies LTOS etc...    HxLBP> prev ortho eval by [name]... He uses OTC analgesics as needed...    Anxiety> on Valium5 as needed & requests refill...    Onychomycosis> he had eval by DePayton MccallumDrJones, & was started on Lamisil orally,,,  We reviewed prob list, meds, xrays and labs> see below for updates >>   CXR 9/14 showed COPD, patchy opac RUL- ?inflamm, ?atx, ?other... We decided to treat w/ Mucinex, Fluids, & repeat CXR 31m72mo  LABS 9/14 (not fasting today):  Chems- ok x Na=130, LFTs= wnl;  CBC- wnl;  TSH=2.38;  PSA=0.43...  ADDENDUM>> follow up CXR 10/14 showed resolution of prev RUL opac; norm heart size, mild hyperinflation, NAD...  ~  June 03, 2014:  36m18mo & [name] recent URI symptoms, bilat parotid gland hypertrophy, [name]> he notes URI about 2 wks ago & went to  [facility] treated w/ ?antibiotic but he can't recall what & only sl better; then called here c/o TMJ pain & he was told Dentist or Oral surgeon required; we did agree to try hot soaks, Advil vs Aleve, & gave him a Pred dosepak- sl better; he is quite vague about his actual symptoms but he has long hx of bilat parotid hypertrophy & advised to use sialogogues, lozenges, warm compresses, etc (and he's been prev offered ENT consultation)... We reviewed the following medical problems during today's office visit >>     HxAR> he has seen [name] in the past w/ sinus turbinate reduction in 2002; asked to avoid antihist due to parotid dis...    Parotid Hypertrophy> known bilat parotid swelling, no change & he denies dry mouth symptoms or sequelae, rec for sialogogues, lozenges, warm compresses, f/u w/ ENT if  necessary...     COPD> former very heavy smoker, quit 2007; see prev CXR, CTChest, PFTs below; notes min cough, no sput, no hemoptysis, denies SOB, etc...    Hyperlipid> on low fat diet alone but remains overweight at 236# (BMI=34); last FLP 8/13 showed TChol 139, TG 259, HDL 35, LDL 58; rec for better low fat diet, get wt down, ret for another FLP.    Overweight> weight is 236# this yr; BMI ~34 and we reviewed diet, exercise, & wt reduction strategies...    GI- GERD, needs screening colon> he uses OTC Prilosec prn; denies dysphagia, abd pain, n/v, c/d, blood seen; he had screening colonoscopy 12/14 by DrPerry- NEG, wnl & rec to f/u 10 yrs.    GU- he's had prev vasectomy by [name]... Denies LTOS etc...    HxLBP> prev ortho eval by [name]... He uses OTC analgesics as needed...    Anxiety> on Valium5 as needed & requests refill...    Onychomycosis> he had eval by Payton Mccallum, [name], & was prev rx w/ Lamisil; then he saw Podiarty DrSikora & treated w/ Jublia... We reviewed prob list, meds, xrays and labs> see below for updates >>  PLAN>> his symptoms are quite vague but he is concerned; rec to use the Pred dosepak as prescribed, warm compresses, sialogogues/ lozenges, Advil vs Aleve; rec f/u appt w/ ENT [name] for his opinion & pt will consider this; BP is elev and we decided to start treatment w/ MetopER50, no salt in diet, & follow up BP here...  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 we reviewed the following medical problems during today's office visit >>    HxAR> he has seen [name] in the past w/ sinus turbinate reduction in 2002; asked to  avoid antihist due to parotid dis, ok to use nasal Saline, OTC Flonase, etc...    Parotid Hypertrophy> known bilat parotid swelling, no change & he notes some dry mouth symptoms or sequelae, rec for sialogogues, Biotene, lozenges, warm compresses, f/u w/ ENT if necessary...     COPD> former heavy smoker, quit 2007; see prev CXR, CTChest, PFTs below; notes min cough, no sput, no hemoptysis, but says incr SOB "like I can't  get a DB, not satisfied breathing"=> rec to try PROAIR prn & ATIVAN 22m prn.    HBP>  As above- we discussed lifestyle mod w/ diet/ exercise/ wt reduction & decided to place him on Amlodipine 5638md w/ careful f/u BPs at home & here...    Hyperlipid> on low fat diet alone but remains overweight at 236# (BMI=34); FLP 12/16 showed TChol 141, TG 296, HDL 31, LDL 58; rec for better low fat diet, get wt down in order to improve the TG & HDL.    Overweight> weight is 236# this yr; BMI ~34 and we reviewed diet, exercise, & wt reduction strategies...    GI- GERD, needs screening colon> he uses OTC Prilosec prn; denies dysphagia, abd pain, n/v, c/d, blood seen; he had screening colonoscopy 12/14 by DrPerry- NEG, wnl & rec to f/u 10 yrs.    GU- he's had prev vasectomy by [name]... Denies LTOS etc...    HxLBP> prev ortho eval by [name]... He uses OTC analgesics as needed...    Anxiety> prev on Valium5 as needed in past; we discussed use of Lorazepam 38m42m1/2 to 1 tab Tid as needed for dyspnea & anxiety...     Onychomycosis> he had eval by DerPayton MccallumrJones, & was prev rx w/ Lamisil; then he saw Podiarty DrSikora & treated w/ Jublia... EXAM shows Afeb, VSS, O2sat=98% on RA,  HEENT- marked parotid hypertrophy, mallamapati2;  Chest- clear w/o w/r/r;  Heart- RR w/o m/r/g;  Abd- neg, nontender, no masses;  Ext- neg w/o c/c/e;  Neuro- neg w/o focal abn...   CXR 03/30/15>  Norm heart size, lungs are clear x some scarring at right base, NAD...   Spirometry 03/30/15>  FVC=5.19 (107%), FEV1=3.22  (84%), %1sec=62, mid-flows reduced at 41% predicted... This is c/w mild-mod airflow obstruction & we discussed PROAIR-HFA for prn use...  LABS 03/30/15>  FLP- Chol ok but TG elev;  Chems- wnl;  CBC- wnl;  TSH=2.02;  PSA=0.46 IMP/PLAN>>  LarTirthturns for his annual CPX w/ report of elevBP & needs Rx for DOT- discussed lifestyle mod w/ DIET/ EXERCISE/ wt reduction, and we are starting low dose Amlodipine 5mg25m he will monitor BP at home & call for issues  In addition his Hyperlipidemia is unchanged, so is his weight etc- needs wt reduction & a low fat diet to effect improvement in these numbers;  Almost as an afterthought he noted SOB/DOE that seems to take 2 forms: 1) SOB w/ activity & wheezing noted- Rx w/ ProairHFA rescue inhaler 2sp Q6H as needed;  2) SOB at rest "like I can't get a DB, trouble getting the air "IN"- we discussed Rx w/ Lorazepam 38mg-106m2 to 1 tab Tid prn...   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  2) he has some discomfort in his left shoulder ?injury several months ago & he takes Aleve daily, we will add Voltaren gel & refer to Ortho when he is ready...     Breathing is stable- denies cough, sput, ch in DOE/SOB, edema, etc... He has declined to use regular inhalers and has NOT  had resp exac; he saw DrWet 08/2015 in my absence w/ sinus infection (Rx Levaquin, Pred, & improved)..    BP appears sl evel at 160/82 & pt stopped the [facility] on his own (we started 12/16)- he prefers to control w/ DIET but told he will need to lose the weight...    Chol looks ok on diet alone but TG elev & HDL is low- we discussed need to get on diet & get the  weight down if he wants to avoid meds...    Anxiety/ Insomnia> the Lorazepam 2m works well for him, rests satis, wakes refreshed, no daytime hypersomnolence...  EXAM shows Afeb, VSS, O2sat=98% on RA,  HEENT- marked parotid hypertrophy, mallamapati2;  Chest- clear w/o w/r/r;  Heart- RR w/o m/r/g;  Abd- neg, nontender, no masses;  Ext- neg w/o c/c/e;  Neuro- neg w/o focal abn.. IMP/PLAN>>  LMarcellisreports that he is doing satis on the Lorazepam in the eve to help him rest, medically stable; he injured his left shoulder several months ago & needs ortho eval; add Voltaren Gel to his Aleve and rec OV w/ Ortho for assessment...   ~  February 27, 2017:  Yearly ROV & CPX> LEdibertoreports a good yr overall- notes recent sinus infection, but says he's better now w/ Augmentin & Pred dosepak called in last month; he notes min cough/ sput/ DOE & recently built a chicken barn & did ok w/ the construction- ROS is otherw neg... He has visited DrRegal, Podiatrist w/ ingrown toenail treated surgically...  We reviewed the following medical problems during today's office visit>      HxAR> he has seen [name] in the past w/ sinus turbinate reduction in 2002; asked to avoid antihist due to parotid dis, ok to use nasal Saline, OTC Flonase, etc...    Parotid Hypertrophy> known bilat parotid swelling, no change & he notes some dry mouth symptoms & rec for sialogogues, Biotene, lozenges, warm compresses, f/u w/ ENT if necessary...     COPD> former heavy smoker, quit 2007; see prev CXR, CTChest, PFTs; notes min cough, no sput, no hemoptysis, but says incr SOB "like I can't get a DB, not satisfied breathing"=> rec to try PROAIR prn & ATIVAN 177mprn.    Mild HBP>  we discussed lifestyle mod w/ diet/ exercise/ wt reduction & decided to place him on Amlodipine 104m27m w/ careful f/u BPs at home & here=> improved to 140/80 range.    Hyperlipid> on low fat diet alone but remains overweight at 218# (BMI=34); FLP 11/18 showed TChol 124, TG  245, HDL 41, LDL 50; rec for better low fat diet, get wt down in order to improve the TG & HDL.    Overweight> weight is 236# this yr; BMI ~34 and we reviewed diet, exercise, & wt reduction strategies...    GI- GERD, neg screening colon 2014> he uses OTC Prilosec prn; denies dysphagia, abd pain, n/v, c/d, blood seen; he had screening colonoscopy 12/14 by DrPerry- NEG, wnl & rec to f/u 10 yrs.    GU- he's had prev vasectomy by [name], PSAs have been wnl... Denies LTOS etc...    HxLBP> prev ortho eval by [name]... He uses OTC analgesics as needed...    Anxiety> prev on Valium5 as needed in past; we discussed use of Lorazepam 1mg67m/2 to 1 tab Tid as needed for dyspnea & anxiety...     Onychomycosis & ingrown toenail> he had eval by Derm, [name], & was prev rx w/ Lamisil;  then he saw Podiarty DrSikora/ Regal & treated w/ Jublia... EXAM shows Afeb, VSS, O2sat=100% on RA,  HEENT- marked parotid hypertrophy, mallamapati2;  Chest- clear w/o w/r/r;  Heart- RR w/o m/r/g;  Abd- neg, nontender, no masses;  Ext- neg w/o c/c/e;  Neuro- neg w/o focal abn..  CXR 02/27/17 showed norm heart size, mild aortic atherosclerosis, hyperinflation/ COPD w/ scarring on the right w/ blunt right angle- stable & NAD  EKG 02/27/17 showed NSR, rate67, IRBBB in V1, otherw wnl/ NAD...  LABS 02/27/17>  FLP- Chol ok on diet alone but TG=245;  Chems- wnl w/ BS=96, Cr=0.85, LFTs wnl;  CBC- wnl w/ Hg=16.4;  TSH=2.08,  PSA=0.51 IMP/PLAN>>  Channin remains stable at 56- REC to continue same meds (Amlod5, Lorazepam prn) and continue to work on diet, exercise, wt reduction;  We reviewed the need for low sodium & low fat diets...   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     Problem list reviewed>  AR on antihist + Flonase + Mucinex prn;  Former heavy smoker, mild underlying COPD but asymptomatic & NOT on inhalers (his choice- has ProairHFA prn use;  ROS otherw neg...  EXAM shows Afeb, VSS- BP=140-150/70-80, O2sat=99% on RA,  HEENT- marked parotid hypertrophy, mallamapati2;  Chest- clear w/o w/r/r;  Heart- RR w/o m/r/g;  Abd- neg, nontender, no masses;  Ext- neg w/o c/c/e;  Neuro- neg.  IMP/PLAN>>  We discussed Lovett's HBP & the dizziness- likely BPPV>  Decided to incr the Losartan to '100mg'$  daily at this point & monitor BP at home (could add-back the Amlod5 if needed vs other options);  His dizziness sounds like BPPV & we reviewed the epley maneuvers, gave him a hand out & instructed re: you-tube videos avail on the internet.  Finally wrote for Antivert '25mg'$  Q6H prn trial;  He has rov sched in about 6wks for recheck...           Problem List:     Bilat PAROTID GLAND HYPERTROPHY >> known parotid hypertrophy, no nodules palpated and normal salivary secretions & dentition; we reviewed the rec for sialogogues such as Richard drops etc... ~  2/16: he is c/o his parotid hypertrophy but it seems the same on exam from rev evaluations; rec warm compressed, sialogogues/ lozenges, Advil/ Aleve, & we wrote for an empiric Pred dosepak trial; he will call ENT for their review...  CHEST PAIN, ATYPICAL (ICD-786.59) - no prev hx of cardiac problems... risk factors include: +FamHx, Smoking Hx, low HDL, male gender... we will screen w/ a Stress Myoview... he saw [name] 5/08- EKG w/ NSR, IRBBB, NSSTTWA... Myoview was done 8/09 & showed good exerc capacity, normal BP response, no signif EKG changes,  normal images w/o ischemia/ scar & EF= 58%... ~  EKG 8/11 shows NSR, WNL..[name] ~  8/13:  Denies CP, palpit, SOB, edema; BP= 150/80  after rest; exercise= yard work; he knows to avoid salt & get wt down...  ALLERGIC RHINITIS (ICD-477.9) - uses OTC meds as needed... prev ENT surgery from [place] in 2002 w/ turbinate reduction... he has prominent parotid hypertrophy bilat==> & we discussed Rx w/ sialogogues etc...  COPD (ICD-496) - former heavy smoker and quit in 2007... chronic bronchitic symptoms... prev on Symbicort but he stopped this on his own & refuses Spiriva due to side effects discussed on TV ads...  ABNORMAL CHEST XRAY (ICD-793.1) - prev CXR's w/ hyperinflation c/w COPD, 1cm RLL density without change- likely scarring... ~  CTChest 11/07 showed hyperinflation w/ emphysematous changes in the upper lobes, 32m RML nodule without change, several other scattered sm nodules, pleural thickening, and ca++ in Abd Ao...  ~  baseline CXR shows hyperinflation, some scarring, NAD... ~  PFT's 7/09 showed FVC= 6.61 (135%), FEV1=3.88 (97%), %1sec=59, mid-flows= 44%. ~  CXR 8/11 showed COPD w/ hyperaeration, min scarring, NAD> confirmed 10/11 & Left Rib details= neg. ~  8/11:  started on DULERA 100-5 2spBid==> he stopped "I don't like meds" ~  CXR 8/13 showed normal heart size, clear lungs, NAD..[name] ~  CXR 9/14 showed COPD, patchy opac RUL- ?inflamm, ?atx, ?other... We decided to treat w/ Mucinex, Fluids, & repeat CXR 1104mo f/u film showed resolution of RUL opacity...  HYPERLIPIDEMIA (ICD-272.4) - on diet therapy alone w/ incr TG assoc w/ recent weight gain... discussed diet + exercise. ~  FLP 8/09 (wt=203#) showed TChol 139, TG 116, HDL 35, LDL 81 ~  FLP 8/11 (wt=230#) showed TChol 145, TG 247, HDL 33, LDL 68... advised diet + exercise, get wt down. ~  FLP 8/13 (wt=237#) showed TChol 139, TG 259, HDL 35, LDL 58 ~  9/14: pt not fasting for f/u FLP- he's gained 9# & we reviewed need for low carb, low fat diet  & wt reduction...  OBESITY >> we reviewed diet recommendations, exercise prescription, & wt reduction strategies.. ~  Weight 8/13 = 237#, BMI= 34 ~  Weight 9/14 = 246# ~  Weight 2/16 = 236#  GERD (ICD-530.81) - on PRILOSEC '20mg'$  Prn for reflux symptoms... ~  Screening colonoscopy 12/14 by DrPerry was NEG- wnl & f/u rec in 10 yrs...  GU> He had Vasectomy from [name] in Jan2012...  BACK PAIN, LUMBAR (ICD-724.2) - prev eval from [name]...  ANXIETY (ICD-300.00) - on VALIUM '5mg'$  as needed... he states Xanax is too strong...  Health Maintenance - FlBarodahowed rectal fissure otherw neg...   Current Medications, Allergies, Past Medical History, Past Surgical History, Family History, and Social History were reviewed in [employer].     Past Surgical History:  Procedure Laterality Date  . APPENDECTOMY    . COLONOSCOPY  15 years ago  . NASAL TURBINATE REDUCTION  2002   Dr.Crossley  . VASECTOMY      Outpatient Encounter Medications as of 08/03/2017  Medication Sig  . albuterol (PROAIR HFA) 108 (90 Base) MCG/ACT inhaler Inhale 1-2 puffs every 6 (six) hours as needed into the lungs for wheezing or shortness of breath.  . guaiFENesin (MUCINEX) 600 MG 12 hr tablet Take 600 mg by mouth daily. 1 tab daily  . ibuprofen (ADVIL,MOTRIN) 200 MG tablet Take 600 mg by mouth daily. As needed  . LORazepam (ATIVAN) 1 MG tablet Take 1 tablet (1 mg total) 3 (three) times daily as needed by mouth. for anxiety  . amLODipine (NORVASC) 5 MG tablet Take 1 tablet (5 mg total) daily  by mouth                   NOTE- Pt not taking thuis med  . losartan (COZAAR) 50 MG tablet Take 1 tablet (50 mg total) by mouth daily.   No facility-administered encounter medications on file as of 08/03/2017.     Allergies  Allergen Reactions  . Codeine Itching    Immunization History  Administered Date(s) Administered  . Influenza Split 01/25/2012  . Influenza Whole 02/13/2009,  01/25/2010  . Influenza,inj,Quad PF,6+ Mos 03/30/2015, 02/16/2016, 02/27/2017  . Tdap 12/12/2011    Current Medications, Allergies, Past Medical History, Past Surgical History, Family History, and Social History were reviewed in [employer] record.    Review of Systems        See HPI - all other systems neg except as noted...       The patient complains of chest pain and dyspnea on exertion.  The patient denies anorexia, fever, weight loss, weight gain, vision loss, decreased hearing, hoarseness, syncope, peripheral edema, prolonged cough, headaches, hemoptysis, abdominal pain, melena, hematochezia, severe indigestion/heartburn, hematuria, incontinence, muscle weakness, suspicious skin lesions, transient blindness, difficulty walking, depression, unusual weight change, abnormal bleeding, enlarged lymph nodes, and angioedema.     Objective:   Physical Exam      WD, WN, 57 y/o WM in NAD... GENERAL:  Alert & oriented; pleasant & cooperative... He has very large symmetric bilat parotid glands, no nodules or tenderness... HEENT:  [place]/AT, EOM-wnl, PERRLA,  EACs-clear, TMs-wnl, NOSE- turbinates boggy, no drainage; THROAT-clear & wnl;  NECK:  Supple w/ fairROM; no JVD; normal carotid impulses w/o bruits; no thyromegaly or nodules palpated; no lymphadenopathy. CHEST:  Decr BS bilat but clear to P & A; without wheezes/ rales/ or rhonchi heard...  HEART:  Regular Rhythm; without murmurs/ rubs/ or gallops detected... ABDOMEN:  Soft & nontender; normal bowel sounds; no organomegaly or masses palpated... EXT: without deformities, mild arthritic changes; no varicose veins/ venous insuffic/ or edema. NEURO:  CN's intact; motor testing normal; sensory testing normal; gait normal & balance OK. DERM:  No lesions noted; no rash, several tatoos...  RADIOLOGY DATA:  Reviewed in the EPIC EMR & discussed w/ the patient...  LABORATORY DATA:  Reviewed in the EPIC EMR & discussed w/ the  patient...   Assessment & Plan:    COPD, ex-smoker, Hx Abn CXR>  He refuses the [facility] etc because he doesn't like meds and doesn't think he needs them; ?RUL opac- Rx mucinex fluids, f/u CXR=> resolved... 03/30/15>    Rishav returns for his annual CPX w/ report of elevBP & needs Rx for DOT- discussed lifestyle mod w/ DIET/ EXERCISE/ wt reduction, and we are starting low dose Amlodipine '5mg'$ /d; he will monitor BP at home & call for issues  In addition his Hyperlipidemia is unchanged, so is his weight etc- needs wt reduction & a low fat diet to effect improvement in these numbers;  Almost as an afterthought he noted SOB/DOE that seems to take 2 forms: 1) SOB w/ activity & wheezing noted- Rx w/ ProairHFA rescue inhaler 2sp Q6H as needed;  2) SOB at rest "like I can't get a DB, trouble getting the air "IN"- we discussed Rx w/ Lorazepam '1mg'$ - 1/2 to 1 tab Tid prn => this really helped... 02/27/17>   Rasean remains stable at 80- REC to continue same meds (Amlod5, Lorazepam prn) and continue to work on diet, exercise, wt reduction;  We reviewed the need for low sodium &  low fat diets. 08/03/17>   We discussed Baine's HBP & the dizziness- likely BPPV>  Decided to incr the Losartan to '100mg'$  daily at this point & monitor BP at home (could add-back the Amlod5 if needed vs other options);  His dizziness sounds like BPPV & we reviewed the epley maneuvers, gave him a hand out & instructed re: you-tube videos avail on the internet.  Finally wrote for Antivert '25mg'$  Q6H prn trial;   Hx AR, Hx sinusitis, bilat parotid hypertrophy>  Prev treated sinus w/ Depo, Pred, Levaquin, Mucinex, etc... rec to avoid antihist, use sialogogues etc...  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 wasn't controlling his BP & we added Losar50 but he misundersttod 7 stopped the amlod; subseq BP check on Losar50 was still sl elev & we chose to incr  to LOSAR100=> he will monitor BP at home & ret for recheck...   HYPERLIPIDEMIA>  He has endeavored to control this w/ diet alone, unfortunately he has gained wt since stopping the smoking;  Needs f/u FLP & consideration of med rx...  GERD>  He continues on Prilosec as needed...  LBP>  Stable w/ prev eval from [name]...  Anxiety>  He uses Valium '5mg'$  prn...   Patient's Medications  New Prescriptions   MECLIZINE (ANTIVERT) 25 MG TABLET    Take 1 tablet (25 mg total) by mouth every 6 (six) hours as needed for dizziness.  Previous Medications   ALBUTEROL (PROAIR HFA) 108 (90 BASE) MCG/ACT INHALER    Inhale 1-2 puffs every 6 (six) hours as needed into the lungs for wheezing or shortness of breath.   GUAIFENESIN (MUCINEX) 600 MG 12 HR TABLET    Take 600 mg by mouth daily. 1 tab daily   IBUPROFEN (ADVIL,MOTRIN) 200 MG TABLET    Take 600 mg by mouth daily. As needed   LORAZEPAM (ATIVAN) 1 MG TABLET    Take 1 tablet (1 mg total) 3 (three) times daily as needed by mouth. for anxiety  Modified Medications   Modified Medication Previous Medication   LOSARTAN (COZAAR) 100 MG TABLET losartan (COZAAR) 100 MG tablet      Take 1 tablet (100 mg total) by mouth daily.    Take 100 mg by mouth daily.  Discontinued Medications   AMLODIPINE (NORVASC) 5 MG TABLET    Take 1 tablet (5 mg total) daily by mouth.   LOSARTAN (COZAAR) 50 MG TABLET    Take 1 tablet (50 mg total) by mouth daily.

## 2017-08-23 DIAGNOSIS — J441 Chronic obstructive pulmonary disease with (acute) exacerbation: Secondary | ICD-10-CM | POA: Diagnosis not present

## 2017-08-28 ENCOUNTER — Other Ambulatory Visit: Payer: Self-pay | Admitting: Pulmonary Disease

## 2017-09-05 ENCOUNTER — Ambulatory Visit: Payer: BLUE CROSS/BLUE SHIELD | Admitting: Pulmonary Disease

## 2017-09-06 ENCOUNTER — Ambulatory Visit: Payer: BLUE CROSS/BLUE SHIELD | Admitting: Pulmonary Disease

## 2017-09-06 ENCOUNTER — Ambulatory Visit (INDEPENDENT_AMBULATORY_CARE_PROVIDER_SITE_OTHER): Payer: BLUE CROSS/BLUE SHIELD

## 2017-09-06 ENCOUNTER — Encounter: Payer: Self-pay | Admitting: Pulmonary Disease

## 2017-09-06 VITALS — BP 150/78 | HR 75 | Temp 97.9°F | Ht 68.0 in | Wt 222.0 lb

## 2017-09-06 DIAGNOSIS — G8929 Other chronic pain: Secondary | ICD-10-CM

## 2017-09-06 DIAGNOSIS — I10 Essential (primary) hypertension: Secondary | ICD-10-CM | POA: Diagnosis not present

## 2017-09-06 DIAGNOSIS — K111 Hypertrophy of salivary gland: Secondary | ICD-10-CM | POA: Diagnosis not present

## 2017-09-06 DIAGNOSIS — J449 Chronic obstructive pulmonary disease, unspecified: Secondary | ICD-10-CM

## 2017-09-06 DIAGNOSIS — M545 Low back pain, unspecified: Secondary | ICD-10-CM

## 2017-09-06 DIAGNOSIS — J301 Allergic rhinitis due to pollen: Secondary | ICD-10-CM

## 2017-09-06 DIAGNOSIS — E782 Mixed hyperlipidemia: Secondary | ICD-10-CM

## 2017-09-06 DIAGNOSIS — F411 Generalized anxiety disorder: Secondary | ICD-10-CM

## 2017-09-06 DIAGNOSIS — E663 Overweight: Secondary | ICD-10-CM

## 2017-09-06 MED ORDER — METHYLPREDNISOLONE ACETATE 80 MG/ML IJ SUSP
80.0000 mg | Freq: Once | INTRAMUSCULAR | Status: AC
Start: 2017-09-06 — End: 2017-09-06
  Administered 2017-09-06: 80 mg via INTRAMUSCULAR

## 2017-09-06 MED ORDER — PREDNISONE 20 MG PO TABS: ORAL_TABLET | ORAL | 0 refills | Status: DC

## 2017-09-06 NOTE — Progress Notes (Signed)
Subjective:    Patient ID: Richard Li, male    DOB: [birth year], 57 y.o.   MRN: [id number]  HPI 57 y/o WM former heavy smoker w/ mild/mod airflow obstruction on Spirometry but min symptoms since he quit smoking in 2007; see prob list below>>   ~  [name]2009:  he was a very heavy smoker and quit in 2007... he has moderate COPD but remarkably preserved PFT's in the past... he was on Symbicort but stopped this on his own... ~  [id number]:  63yrROV- notes some SOB w/ activity in hot weather... he's tried [employer] he states that he didn't like them & the TV advertisment scared him...  ~  Oct2011:  presents w/ 149mox left chest wall pain- CXR repeat> COPD, NAD & rib details neg; symptoms finally resolved after one month off work & time to heal rib fxs...  CXR 8/13 showed normal heart size, clear lungs, NAD...Marland KitchenMarland KitchenEKG 8/13 showed NSR, rate70, wnl, NAD...  LABS 8/13:  FLP- chol ok but TG=259;  Chems- wnl;  CBC- wnl;  TSH=2.11;  PSA=0.45;  UA- wnl   ~  December 25, 2012:  Yearly ROV & CPX> Richard Li a good year overall- he fell in his yard on his right arm 1 wk ago w/ pain in the elbow area & we discussed hot soaks, Mobic15 & OV w/ XRays by [name] etHarriett Sine. He also reports seeing [name], Derm & started on Lamisil orally- needs LFTs checked & copy to Derm- OK... We reviewed the following medical problems during today's office visit >>     HxAR> he has seen [name] in the past w/ sinus turbinate reduction in 2002; asked to avoid antihist due to parotid dis...    Parotid Hypertrophy> known bilat parotid swelling, no change & he denies dry mouth symptoms or sequelae, rec for sialogogues...     COPD> former very heavy smoker, quit 2007; see prev CXR, CTChest, PFTs below; notes min cough, no sput, no hemoptysis, denies SOB, etc...    Hyperlipid> on low fat diet alone but remains overweight & is up 9# this yr; last FLP 8/13 showed TChol 139, TG 259, HDL 35, LDL 58; rec for better low fat diet &  get wt down.    Overweight> weight is up 9# to 246# this yr; BMI ~35 and we reviewed diet, exercise, & wt reduction strategies...    GI- GERD, needs screening colon> he uses OTC Prilosec prn; denies dysphagia, abd pain, n/v, c/d, blood seen; needs screening colon...     GU- he's had prev vasectomy by [name]... Denies LTOS etc...    HxLBP> prev ortho eval by [name]... He uses OTC analgesics as needed...    Anxiety> on Valium5 as needed & requests refill...    Onychomycosis> he had eval by DePayton MccallumDrJones, & was started on Lamisil orally,,,  We reviewed prob list, meds, xrays and labs> see below for updates >>   CXR 9/14 showed COPD, patchy opac RUL- ?inflamm, ?atx, ?other... We decided to treat w/ Mucinex, Fluids, & repeat CXR 31m72mo  LABS 9/14 (not fasting today):  Chems- ok x Na=130, LFTs= wnl;  CBC- wnl;  TSH=2.38;  PSA=0.43...  ADDENDUM>> follow up CXR 10/14 showed resolution of prev RUL opac; norm heart size, mild hyperinflation, NAD...  ~  June 03, 2014:  36m18mo & Richard Li recent URI symptoms, bilat parotid gland hypertrophy, [name]> he notes URI about 2 wks ago & went to  [facility] treated w/ ?antibiotic but he can't recall what & only sl better; then called here c/o TMJ pain & he was told Dentist or Oral surgeon required; we did agree to try hot soaks, Advil vs Aleve, & gave him a Pred dosepak- sl better; he is quite vague about his actual symptoms but he has long hx of bilat parotid hypertrophy & advised to use sialogogues, lozenges, warm compresses, etc (and he's been prev offered ENT consultation)... We reviewed the following medical problems during today's office visit >>     HxAR> he has seen [name] in the past w/ sinus turbinate reduction in 2002; asked to avoid antihist due to parotid dis...    Parotid Hypertrophy> known bilat parotid swelling, no change & he denies dry mouth symptoms or sequelae, rec for sialogogues, lozenges, warm compresses, f/u w/ ENT if  necessary...     COPD> former very heavy smoker, quit 2007; see prev CXR, CTChest, PFTs below; notes min cough, no sput, no hemoptysis, denies SOB, etc...    Hyperlipid> on low fat diet alone but remains overweight at 236# (BMI=34); last FLP 8/13 showed TChol 139, TG 259, HDL 35, LDL 58; rec for better low fat diet, get wt down, ret for another FLP.    Overweight> weight is 236# this yr; BMI ~34 and we reviewed diet, exercise, & wt reduction strategies...    GI- GERD, needs screening colon> he uses OTC Prilosec prn; denies dysphagia, abd pain, n/v, c/d, blood seen; he had screening colonoscopy 12/14 by DrPerry- NEG, wnl & rec to f/u 10 yrs.    GU- he's had prev vasectomy by [name]... Denies LTOS etc...    HxLBP> prev ortho eval by [name]... He uses OTC analgesics as needed...    Anxiety> on Valium5 as needed & requests refill...    Onychomycosis> he had eval by Payton Mccallum, [name], & was prev rx w/ Lamisil; then he saw Podiarty DrSikora & treated w/ Jublia... We reviewed prob list, meds, xrays and labs> see below for updates >>  PLAN>> his symptoms are quite vague but he is concerned; rec to use the Pred dosepak as prescribed, warm compresses, sialogogues/ lozenges, Advil vs Aleve; rec f/u appt w/ ENT [name] for his opinion & pt will consider this; BP is elev and we decided to start treatment w/ MetopER50, no salt in diet, & follow up BP here...  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 we reviewed the following medical problems during today's office visit >>    HxAR> he has seen [name] in the past w/ sinus turbinate reduction in 2002; asked to  avoid antihist due to parotid dis, ok to use nasal Saline, OTC Flonase, etc...    Parotid Hypertrophy> known bilat parotid swelling, no change & he notes some dry mouth symptoms or sequelae, rec for sialogogues, Biotene, lozenges, warm compresses, f/u w/ ENT if necessary...     COPD> former heavy smoker, quit 2007; see prev CXR, CTChest, PFTs below; notes min cough, no sput, no hemoptysis, but says incr SOB "like I can't  get a DB, not satisfied breathing"=> rec to try PROAIR prn & ATIVAN 22m prn.    HBP>  As above- we discussed lifestyle mod w/ diet/ exercise/ wt reduction & decided to place him on Amlodipine 5638md w/ careful f/u BPs at home & here...    Hyperlipid> on low fat diet alone but remains overweight at 236# (BMI=34); FLP 12/16 showed TChol 141, TG 296, HDL 31, LDL 58; rec for better low fat diet, get wt down in order to improve the TG & HDL.    Overweight> weight is 236# this yr; BMI ~34 and we reviewed diet, exercise, & wt reduction strategies...    GI- GERD, needs screening colon> he uses OTC Prilosec prn; denies dysphagia, abd pain, n/v, c/d, blood seen; he had screening colonoscopy 12/14 by DrPerry- NEG, wnl & rec to f/u 10 yrs.    GU- he's had prev vasectomy by [name]... Denies LTOS etc...    HxLBP> prev ortho eval by [name]... He uses OTC analgesics as needed...    Anxiety> prev on Valium5 as needed in past; we discussed use of Lorazepam 38m42m1/2 to 1 tab Tid as needed for dyspnea & anxiety...     Onychomycosis> he had eval by DerPayton MccallumrJones, & was prev rx w/ Lamisil; then he saw Podiarty DrSikora & treated w/ Jublia... EXAM shows Afeb, VSS, O2sat=98% on RA,  HEENT- marked parotid hypertrophy, mallamapati2;  Chest- clear w/o w/r/r;  Heart- RR w/o m/r/g;  Abd- neg, nontender, no masses;  Ext- neg w/o c/c/e;  Neuro- neg w/o focal abn...   CXR 03/30/15>  Norm heart size, lungs are clear x some scarring at right base, NAD...   Spirometry 03/30/15>  FVC=5.19 (107%), FEV1=3.22  (84%), %1sec=62, mid-flows reduced at 41% predicted... This is c/w mild-mod airflow obstruction & we discussed PROAIR-HFA for prn use...  LABS 03/30/15>  FLP- Chol ok but TG elev;  Chems- wnl;  CBC- wnl;  TSH=2.02;  PSA=0.46 IMP/PLAN>>  LarTirthturns for his annual CPX w/ report of elevBP & needs Rx for DOT- discussed lifestyle mod w/ DIET/ EXERCISE/ wt reduction, and we are starting low dose Amlodipine 5mg25m he will monitor BP at home & call for issues  In addition his Hyperlipidemia is unchanged, so is his weight etc- needs wt reduction & a low fat diet to effect improvement in these numbers;  Almost as an afterthought he noted SOB/DOE that seems to take 2 forms: 1) SOB w/ activity & wheezing noted- Rx w/ ProairHFA rescue inhaler 2sp Q6H as needed;  2) SOB at rest "like I can't get a DB, trouble getting the air "IN"- we discussed Rx w/ Lorazepam 38mg-106m2 to 1 tab Tid prn...   ~  February 16, 2016:  138mo 57mo Read Richard Li w/ several concerns> 1) he was switched from Lorazepam to Desyrel over the phone to help him rest but the Desyrel actually doesn't work for him & the Ativan was better when taking 38mg in66me eve to relax and another 38mg at 24mtime for sleep which works well by his description; we reviewed Rx for Lorazepam 38mg- 1/245m 1 tab up to Tid as needed...  2) he has some discomfort in his left shoulder ?injury several months ago & he takes Aleve daily, we will add Voltaren gel & refer to Ortho when he is ready...     Breathing is stable- denies cough, sput, ch in DOE/SOB, edema, etc... He has declined to use regular inhalers and has NOT  had resp exac; he saw DrWet 08/2015 in my absence w/ sinus infection (Rx Levaquin, Pred, & improved)..    BP appears sl evel at 160/82 & pt stopped the [facility] on his own (we started 12/16)- he prefers to control w/ DIET but told he will need to lose the weight...    Chol looks ok on diet alone but TG elev & HDL is low- we discussed need to get on diet & get the  weight down if he wants to avoid meds...    Anxiety/ Insomnia> the Lorazepam 2m works well for him, rests satis, wakes refreshed, no daytime hypersomnolence...  EXAM shows Afeb, VSS, O2sat=98% on RA,  HEENT- marked parotid hypertrophy, mallamapati2;  Chest- clear w/o w/r/r;  Heart- RR w/o m/r/g;  Abd- neg, nontender, no masses;  Ext- neg w/o c/c/e;  Neuro- neg w/o focal abn.. IMP/PLAN>>  LMarcellisreports that he is doing satis on the Lorazepam in the eve to help him rest, medically stable; he injured his left shoulder several months ago & needs ortho eval; add Voltaren Gel to his Aleve and rec OV w/ Ortho for assessment...   ~  February 27, 2017:  Yearly ROV & CPX> Richard Li a good yr overall- notes recent sinus infection, but says he's better now w/ Augmentin & Pred dosepak called in last month; he notes min cough/ sput/ DOE & recently built a chicken barn & did ok w/ the construction- ROS is otherw neg... He has visited DrRegal, Podiatrist w/ ingrown toenail treated surgically...  We reviewed the following medical problems during today's office visit>      HxAR> he has seen [name] in the past w/ sinus turbinate reduction in 2002; asked to avoid antihist due to parotid dis, ok to use nasal Saline, OTC Flonase, etc...    Parotid Hypertrophy> known bilat parotid swelling, no change & he notes some dry mouth symptoms & rec for sialogogues, Biotene, lozenges, warm compresses, f/u w/ ENT if necessary...     COPD> former heavy smoker, quit 2007; see prev CXR, CTChest, PFTs; notes min cough, no sput, no hemoptysis, but says incr SOB "like I can't get a DB, not satisfied breathing"=> rec to try PROAIR prn & ATIVAN 177mprn.    Mild HBP>  we discussed lifestyle mod w/ diet/ exercise/ wt reduction & decided to place him on Amlodipine 104m27m w/ careful f/u BPs at home & here=> improved to 140/80 range.    Hyperlipid> on low fat diet alone but remains overweight at 218# (BMI=34); FLP 11/18 showed TChol 124, TG  245, HDL 41, LDL 50; rec for better low fat diet, get wt down in order to improve the TG & HDL.    Overweight> weight is 236# this yr; BMI ~34 and we reviewed diet, exercise, & wt reduction strategies...    GI- GERD, neg screening colon 2014> he uses OTC Prilosec prn; denies dysphagia, abd pain, n/v, c/d, blood seen; he had screening colonoscopy 12/14 by DrPerry- NEG, wnl & rec to f/u 10 yrs.    GU- he's had prev vasectomy by [name], PSAs have been wnl... Denies LTOS etc...    HxLBP> prev ortho eval by [name]... He uses OTC analgesics as needed...    Anxiety> prev on Valium5 as needed in past; we discussed use of Lorazepam 1mg67m/2 to 1 tab Tid as needed for dyspnea & anxiety...     Onychomycosis & ingrown toenail> he had eval by Derm, [name], & was prev rx w/ Lamisil;  then he saw Podiarty DrSikora/ Regal & treated w/ Jublia... EXAM shows Afeb, VSS, O2sat=100% on RA,  HEENT- marked parotid hypertrophy, mallamapati2;  Chest- clear w/o w/r/r;  Heart- RR w/o m/r/g;  Abd- neg, nontender, no masses;  Ext- neg w/o c/c/e;  Neuro- neg w/o focal abn..  CXR 02/27/17 showed norm heart size, mild aortic atherosclerosis, hyperinflation/ COPD w/ scarring on the right w/ blunt right angle- stable & NAD  EKG 02/27/17 showed NSR, rate67, IRBBB in V1, otherw wnl/ NAD...  LABS 02/27/17>  FLP- Chol ok on diet alone but TG=245;  Chems- wnl w/ BS=96, Cr=0.85, LFTs wnl;  CBC- wnl w/ Hg=16.4;  TSH=2.08,  PSA=0.51 IMP/PLAN>>  Richard Li remains stable at 56- REC to continue same meds (Amlod5, Lorazepam prn) and continue to work on diet, exercise, wt reduction;  We reviewed the need for low sodium & low fat diets...  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     Problem list reviewed>  AR on antihist + Flonase + Mucinex prn;  Former heavy smoker, mild underlying COPD but asymptomatic & NOT on inhalers (his choice- has ProairHFA prn use);  ROS otherw neg...  EXAM shows Afeb, VSS- BP=140-150/70-80, O2sat=99% on RA,  HEENT- marked parotid hypertrophy, mallamapati2;  Chest- clear w/o w/r/r;  Heart- RR w/o m/r/g;  Abd- neg, nontender, no masses;  Ext- neg w/o c/c/e;  Neuro- neg.  IMP/PLAN>>  We discussed Zavier's HBP & the dizziness- likely BPPV>  Decided to incr the Losartan to '100mg'$  daily at this point & monitor BP at home (could add-back the Amlod5 if needed vs other options);  His dizziness sounds like BPPV & we reviewed the epley maneuvers, gave him a hand out & instructed re: you-tube videos avail on the internet.  Finally wrote for Antivert '25mg'$  Q6H prn trial;  He has rov sched in about 6wks for recheck...    ~  Sep 06, 2017:  127moOV & Richard Li reports that his dizziness has improved ~90% and BP doing better on home BP checks w/ the CoBraxton Today he is noting nasal congestion, drainage, blowing beige/ thick mucus- no blood; he says he went to a [facility] x2 & given ZPak but no better => we discussed Rx w/ 1) nasal regimen: Allegra180 Qam, Nasal Saline Q1-2H during the day, and Flonase- 2sp each nostril Qhs; 2) Depo80 + Pred '20mg'$ - 5d taper...  We reviewed the following medical problems during today's office visit>      HxAR> he has seen [name] in the past w/ sinus turbinate reduction in 2002; asked to avoid antihist due to parotid dis, ok to use nasal Saline, OTC Flonase, etc...    Parotid Hypertrophy> known bilat parotid swelling, no change & he notes some  dry mouth symptoms & rec for sialogogues, Biotene, lozenges, warm compresses, f/u w/ ENT if necessary...Marland KitchenMarland Kitchen  COPD> former heavy smoker, quit 2007; see prev CXR, CTChest, PFTs; notes min cough, no sput, no hemoptysis, but says incr SOB "like I can't get a DB, not satisfied breathing"=> rec to try PROAIR prn & ATIVAN '1mg'$  prn.    HBP>  we discussed lifestyle mod w/ diet/ exercise/ wt reduction & prev rx w/ Amlodipine5 but he felt it wasn't working for his BP; switched to Cozaar50=>100/d & doing better now...    Hyperlipid> on low fat diet alone but remains overweight at 222# (BMI=34); FLP 11/18 showed TChol 124, TG 245, HDL 41, LDL 50; rec for better low fat diet, get wt down in order to improve the TG & HDL.    Overweight> weight is 222# & w/ BMI ~34 and we reviewed diet, exercise, & wt reduction strategies...    GI- GERD, neg screening colon 2014> he uses OTC Prilosec prn; denies dysphagia, abd pain, n/v, c/d, blood seen; he had screening colonoscopy 12/14 by DrPerry- NEG, wnl & rec to f/u 10 yrs.    GU- he's had prev vasectomy by [name], PSAs have been wnl... Denies LTOS etc...    HxLBP> prev ortho eval by [name]... He uses OTC analgesics as needed...    Anxiety> prev on Valium5 as needed in past; we discussed use of Lorazepam '1mg'$ - 1/2 to 1 tab Tid as needed for dyspnea & anxiety...     Onychomycosis & ingrown toenail> he had eval by Payton Mccallum, [name], & was prev rx w/ Lamisil; then he saw Podiarty DrSikora/ Regal & treated w/ Jublia... EXAM shows Afeb, VSS, O2sat=96% on RA,  HEENT- marked parotid hypertrophy, mallamapati2;  Chest- clear w/o w/r/r;  Heart- RR w/o m/r/g;  Abd- neg, nontender, no masses;  Ext- neg w/o c/c/e;  Neuro- neg w/o focal abn.. IMP/PLAN>>  We reviewed the need for diet/ exercise/ wt reduction;  Continue Losar100 + no salt & monitor BP at home;  We reviewed treatment for sinuses/ nasal hygiene using Allegra180 Qam, Nasal Saline Q1-2H during the day, and Flonase- 2sp each  nostril Qhs; For his acute URI/ nasal inflamm symptoms=> Depo80 + Pred '20mg'$ - 5d taper...          Problem List:     Bilat PAROTID GLAND HYPERTROPHY >> known parotid hypertrophy, no nodules palpated and normal salivary secretions & dentition; we reviewed the rec for sialogogues such as Richard drops etc... ~  2/16: he is c/o his parotid hypertrophy but it seems the same on exam from rev evaluations; rec warm compressed, sialogogues/ lozenges, Advil/ Aleve, & we wrote for an empiric Pred dosepak trial; he will call ENT for their review...  CHEST PAIN, ATYPICAL (ICD-786.59) - no prev hx of cardiac problems... risk factors include: +FamHx, Smoking Hx, low HDL, male gender... we will screen w/ a Stress Myoview... he saw [name] 5/08- EKG w/ NSR, IRBBB, NSSTTWA... Myoview was done 8/09 & showed good exerc capacity, normal BP response, no signif EKG changes, normal images w/o ischemia/ scar & EF= 58%... ~  EKG 8/11 shows NSR, WNL.[name]. ~  8/13:  Denies CP, palpit, SOB, edema; BP= 150/80 after rest; exercise= yard work; he knows to avoid salt & get wt down...  ALLERGIC RHINITIS (ICD-477.9) - uses OTC meds as needed... prev ENT surgery from [facility] in 2002 w/ turbinate reduction... he has prominent parotid hypertrophy bilat==> & we discussed Rx w/ sialogogues etc...  COPD (ICD-496) - former heavy smoker and quit in 2007... chronic bronchitic symptoms... prev on Symbicort but he stopped this on his  own & refuses Spiriva due to side effects discussed on TV ads...  ABNORMAL CHEST XRAY (ICD-793.1) - prev CXR's w/ hyperinflation c/w COPD, 1cm RLL density without change- likely scarring... ~  CTChest 11/07 showed hyperinflation w/ emphysematous changes in the upper lobes, 64m RML nodule without change, several other scattered sm nodules, pleural thickening, and ca++ in Abd Ao...  ~  baseline CXR shows hyperinflation, some scarring, NAD... ~  PFT's 7/09 showed FVC= 6.61 (135%), FEV1=3.88 (97%), %1sec=59,  mid-flows= 44%. ~  CXR 8/11 showed COPD w/ hyperaeration, min scarring, NAD> confirmed 10/11 & Left Rib details= neg. ~  8/11:  started on DULERA 100-5 2spBid==> he stopped "I don't like meds" ~  CXR 8/13 showed normal heart size, clear lungs, NAD..[name] ~  CXR 9/14 showed COPD, patchy opac RUL- ?inflamm, ?atx, ?other... We decided to treat w/ Mucinex, Fluids, & repeat CXR 145mo f/u film showed resolution of RUL opacity...  HYPERLIPIDEMIA (ICD-272.4) - on diet therapy alone w/ incr TG assoc w/ recent weight gain... discussed diet + exercise. ~  FLP 8/09 (wt=203#) showed TChol 139, TG 116, HDL 35, LDL 81 ~  FLP 8/11 (wt=230#) showed TChol 145, TG 247, HDL 33, LDL 68... advised diet + exercise, get wt down. ~  FLP 8/13 (wt=237#) showed TChol 139, TG 259, HDL 35, LDL 58 ~  9/14: pt not fasting for f/u FLP- he's gained 9# & we reviewed need for low carb, low fat diet & wt reduction...  OBESITY >> we reviewed diet recommendations, exercise prescription, & wt reduction strategies.. ~  Weight 8/13 = 237#, BMI= 34 ~  Weight 9/14 = 246# ~  Weight 2/16 = 236#  GERD (ICD-530.81) - on PRILOSEC '20mg'$  Prn for reflux symptoms... ~  Screening colonoscopy 12/14 by DrPerry was NEG- wnl & f/u rec in 10 yrs...  GU> He had Vasectomy from [name] in Jan2012...  BACK PAIN, LUMBAR (ICD-724.2) - prev eval from [name]...  ANXIETY (ICD-300.00) - on VALIUM '5mg'$  as needed... he states Xanax is too strong...  Health Maintenance - FlNew Riverhowed rectal fissure otherw neg...   Current Medications, Allergies, Past Medical History, Past Surgical History, Family History, and Social History were reviewed in [employer].     Past Surgical History:  Procedure Laterality Date  . APPENDECTOMY    . COLONOSCOPY  15 years ago  . NASAL TURBINATE REDUCTION  2002   Dr.Crossley  . VASECTOMY      Outpatient Encounter Medications as of 09/06/2017  Medication Sig  . albuterol (PROAIR HFA)  108 (90 Base) MCG/ACT inhaler Inhale 1-2 puffs every 6 (six) hours as needed into the lungs for wheezing or shortness of breath.  . guaiFENesin (MUCINEX) 600 MG 12 hr tablet Take 600 mg by mouth daily. 1 tab daily  . ibuprofen (ADVIL,MOTRIN) 200 MG tablet Take 600 mg by mouth daily. As needed  . LORazepam (ATIVAN) 1 MG tablet TAKE 1 TABLET BY MOUTH THREE TIMES DAILY FOR ANXIETY  . losartan (COZAAR) 100 MG tablet Take 1 tablet (100 mg total) by mouth daily.  . meclizine (ANTIVERT) 25 MG tablet TAKE 1 TABLET BY MOUTH EVERY 6 HOURS AS NEEDED FOR DIZZINESS  . predniSONE (DELTASONE) 20 MG tablet Take as directed.   No facility-administered encounter medications on file as of 09/06/2017.     Allergies  Allergen Reactions  . Codeine Itching    Immunization History  Administered Date(s) Administered  . Influenza Split 01/25/2012  . Influenza Whole 02/13/2009, 01/25/2010  .  Influenza,inj,Quad PF,6+ Mos 03/30/2015, 02/16/2016, 02/27/2017  . Tdap 12/12/2011    Current Medications, Allergies, Past Medical History, Past Surgical History, Family History, and Social History were reviewed in [employer] record.    Review of Systems        See HPI - all other systems neg except as noted...       The patient complains of chest pain and dyspnea on exertion.  The patient denies anorexia, fever, weight loss, weight gain, vision loss, decreased hearing, hoarseness, syncope, peripheral edema, prolonged cough, headaches, hemoptysis, abdominal pain, melena, hematochezia, severe indigestion/heartburn, hematuria, incontinence, muscle weakness, suspicious skin lesions, transient blindness, difficulty walking, depression, unusual weight change, abnormal bleeding, enlarged lymph nodes, and angioedema.     Objective:   Physical Exam      WD, WN, 57 y/o WM in NAD... GENERAL:  Alert & oriented; pleasant & cooperative... He has very large symmetric bilat parotid glands, no nodules or  tenderness... HEENT:  [place]/AT, EOM-wnl, PERRLA,  EACs-clear, TMs-wnl, NOSE- turbinates boggy, no drainage; THROAT-clear & wnl;  NECK:  Supple w/ fairROM; no JVD; normal carotid impulses w/o bruits; no thyromegaly or nodules palpated; no lymphadenopathy. CHEST:  Decr BS bilat but clear to P & A; without wheezes/ rales/ or rhonchi heard...  HEART:  Regular Rhythm; without murmurs/ rubs/ or gallops detected... ABDOMEN:  Soft & nontender; normal bowel sounds; no organomegaly or masses palpated... EXT: without deformities, mild arthritic changes; no varicose veins/ venous insuffic/ or edema. NEURO:  CN's intact; motor testing normal; sensory testing normal; gait normal & balance OK. DERM:  No lesions noted; no rash, several tatoos...  RADIOLOGY DATA:  Reviewed in the EPIC EMR & discussed w/ the patient...  LABORATORY DATA:  Reviewed in the EPIC EMR & discussed w/ the patient...   Assessment & Plan:    COPD, ex-smoker, Hx Abn CXR>  He refuses the [facility] etc because he doesn't like meds and doesn't think he needs them; ?RUL opac- Rx mucinex fluids, f/u CXR=> resolved... 03/30/15>    Richard Li returns for his annual CPX w/ report of elevBP & needs Rx for DOT- discussed lifestyle mod w/ DIET/ EXERCISE/ wt reduction, and we are starting low dose Amlodipine '5mg'$ /d; he will monitor BP at home & call for issues  In addition his Hyperlipidemia is unchanged, so is his weight etc- needs wt reduction & a low fat diet to effect improvement in these numbers;  Almost as an afterthought he noted SOB/DOE that seems to take 2 forms: 1) SOB w/ activity & wheezing noted- Rx w/ ProairHFA rescue inhaler 2sp Q6H as needed;  2) SOB at rest "like I can't get a DB, trouble getting the air "IN"- we discussed Rx w/ Lorazepam '1mg'$ - 1/2 to 1 tab Tid prn => this really helped... 02/27/17>   Richard Li remains stable at 60- REC to continue same meds (Amlod5, Lorazepam prn) and continue to work on diet, exercise, wt reduction;  We reviewed the need  for low sodium & low fat diets. 08/03/17>   We discussed Richard Li's HBP & the dizziness- likely BPPV>  Decided to incr the Losartan to '100mg'$  daily at this point & monitor BP at home (could add-back the Amlod5 if needed vs other options);  His dizziness sounds like BPPV & we reviewed the epley maneuvers, gave him a hand out & instructed re: you-tube videos avail on the internet.  Finally wrote for Antivert '25mg'$  Q6H prn trial; 09/06/17>   We reviewed the need for diet/ exercise/  wt reduction;  Continue Losar100 + no salt & monitor BP at home;  We reviewed treatment for sinuses/ nasal hygiene using Allegra180 Qam, Nasal Saline Q1-2H during the day, and Flonase- 2sp each nostril Qhs; For his acute URI/ nasal inflamm symptoms=> Depo80 + Pred '20mg'$ - 5d taper  Hx AR, Hx sinusitis, bilat parotid hypertrophy>  Prev treated sinus w/ Depo, Pred, Levaquin, Mucinex, etc... rec to avoid antihist, use sialogogues etc...  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   HYPERLIPIDEMIA>  He has endeavored to control this w/ diet alone, unfortunately he has gained wt since stopping the smoking;  Needs f/u FLP & consideration of med rx...  GERD>  He continues on Prilosec as needed...  LBP>  Stable w/ prev eval from [name]...  Anxiety>  He uses Valium '5mg'$  prn...   Patient's Medications  New Prescriptions   PREDNISONE (DELTASONE) 20 MG TABLET    Take as directed.  Previous Medications   ALBUTEROL (PROAIR HFA) 108 (90 BASE) MCG/ACT INHALER    Inhale 1-2 puffs every 6 (six) hours as needed into the lungs for wheezing or shortness of breath.   GUAIFENESIN (MUCINEX) 600 MG 12 HR TABLET    Take 600 mg by mouth daily.  1 tab daily   IBUPROFEN (ADVIL,MOTRIN) 200 MG TABLET    Take 600 mg by mouth daily. As needed   LORAZEPAM (ATIVAN) 1 MG TABLET    TAKE 1 TABLET BY MOUTH THREE TIMES DAILY FOR ANXIETY   LOSARTAN (COZAAR) 100 MG TABLET    Take 1 tablet (100 mg total) by mouth daily.   MECLIZINE (ANTIVERT) 25 MG TABLET    TAKE 1 TABLET BY MOUTH EVERY 6 HOURS AS NEEDED FOR DIZZINESS  Modified Medications   No medications on file  Discontinued Medications   No medications on file

## 2017-09-06 NOTE — Patient Instructions (Signed)
Today we updated your med list in our EPIC system...    Continue your current medications the same...  Continue your Antivert (Meclizine) as needed for the dizziness + the epley maneuvers...  For the sinus symptoms>  We gave you a Depo shot today...    Start PREDNISONE 20mg  tabs in the AM 5/16 as follows>  Start with one tab twice daily for 5 days...  Then decrease to one tab each AM for 5 days...  Then decrease to 1/2 tab daily in the AM for 5 days...  Then decrease to 1/2 tab every other day til gone (1/2, 0, 1/2, 0, etc)...  Start on a good nasal hygiene regimen>  Take an antihistamine like [id number] each AM...  Use a SALINE NASAL MIST- spray each nostril every 1-2H during the day to keep the airways clear...  Use the OTC FLONASE- 2 sprays in each nostril right at bedtime...  Call for any questions...  Let's plan a follow up visit in 65mo, sooner if needed for problems.Marland KitchenMarland Kitchen

## 2017-09-26 ENCOUNTER — Other Ambulatory Visit: Payer: Self-pay | Admitting: Pulmonary Disease

## 2017-09-29 ENCOUNTER — Telehealth: Payer: Self-pay | Admitting: Pulmonary Disease

## 2017-09-29 ENCOUNTER — Other Ambulatory Visit: Payer: Self-pay | Admitting: Pulmonary Disease

## 2017-09-29 NOTE — Telephone Encounter (Signed)
Spoke with pt. He is requesting a refill on Lorazepam. Per our documentation, this was refilled on 08/28/17 with 5 additional refills. Pt has been made aware of this. Nothing further was needed.

## 2017-10-13 DIAGNOSIS — M25562 Pain in left knee: Secondary | ICD-10-CM | POA: Diagnosis not present

## 2017-10-30 ENCOUNTER — Other Ambulatory Visit: Payer: Self-pay | Admitting: Pulmonary Disease

## 2017-11-11 DIAGNOSIS — M25562 Pain in left knee: Secondary | ICD-10-CM | POA: Diagnosis not present

## 2017-11-18 DIAGNOSIS — S83232D Complex tear of medial meniscus, current injury, left knee, subsequent encounter: Secondary | ICD-10-CM | POA: Diagnosis not present

## 2017-11-18 DIAGNOSIS — M25562 Pain in left knee: Secondary | ICD-10-CM | POA: Diagnosis not present

## 2017-11-18 DIAGNOSIS — M1712 Unilateral primary osteoarthritis, left knee: Secondary | ICD-10-CM | POA: Diagnosis not present

## 2017-12-18 ENCOUNTER — Other Ambulatory Visit: Payer: Self-pay | Admitting: Pulmonary Disease

## 2017-12-26 ENCOUNTER — Other Ambulatory Visit: Payer: Self-pay | Admitting: Pulmonary Disease

## 2017-12-26 DIAGNOSIS — M25562 Pain in left knee: Secondary | ICD-10-CM | POA: Diagnosis not present

## 2017-12-27 NOTE — Telephone Encounter (Signed)
Please advise if okay to refill ativan 1 mg #90 tidprn  Last given #90 on 08/28/17   Last ov 08/28/17  Next ov 02/27/18

## 2018-01-24 ENCOUNTER — Ambulatory Visit (INDEPENDENT_AMBULATORY_CARE_PROVIDER_SITE_OTHER): Payer: BLUE CROSS/BLUE SHIELD

## 2018-01-24 ENCOUNTER — Encounter: Payer: Self-pay | Admitting: Pulmonary Disease

## 2018-01-24 ENCOUNTER — Other Ambulatory Visit (INDEPENDENT_AMBULATORY_CARE_PROVIDER_SITE_OTHER): Payer: BLUE CROSS/BLUE SHIELD

## 2018-01-24 ENCOUNTER — Ambulatory Visit: Payer: BLUE CROSS/BLUE SHIELD | Admitting: Pulmonary Disease

## 2018-01-24 VITALS — BP 126/66 | HR 83 | Temp 98.3°F | Ht 67.0 in | Wt 229.0 lb

## 2018-01-24 DIAGNOSIS — E782 Mixed hyperlipidemia: Secondary | ICD-10-CM

## 2018-01-24 DIAGNOSIS — F411 Generalized anxiety disorder: Secondary | ICD-10-CM

## 2018-01-24 DIAGNOSIS — G8929 Other chronic pain: Secondary | ICD-10-CM

## 2018-01-24 DIAGNOSIS — J449 Chronic obstructive pulmonary disease, unspecified: Secondary | ICD-10-CM

## 2018-01-24 DIAGNOSIS — K111 Hypertrophy of salivary gland: Secondary | ICD-10-CM

## 2018-01-24 DIAGNOSIS — Z23 Encounter for immunization: Secondary | ICD-10-CM

## 2018-01-24 DIAGNOSIS — J301 Allergic rhinitis due to pollen: Secondary | ICD-10-CM | POA: Diagnosis not present

## 2018-01-24 DIAGNOSIS — I10 Essential (primary) hypertension: Secondary | ICD-10-CM | POA: Diagnosis not present

## 2018-01-24 DIAGNOSIS — E663 Overweight: Secondary | ICD-10-CM

## 2018-01-24 DIAGNOSIS — M545 Low back pain, unspecified: Secondary | ICD-10-CM

## 2018-01-24 LAB — COMPREHENSIVE METABOLIC PANEL
ALT: 26 U/L (ref 0–53)
AST: 21 U/L (ref 0–37)
Albumin: 4.3 g/dL (ref 3.5–5.2)
Alkaline Phosphatase: 80 U/L (ref 39–117)
BUN: 12 mg/dL (ref 6–23)
CO2: 29 mEq/L (ref 19–32)
Calcium: 9.1 mg/dL (ref 8.4–10.5)
Chloride: 100 mEq/L (ref 96–112)
Creatinine, Ser: 0.98 mg/dL (ref 0.40–1.50)
GFR: 83.75 mL/min (ref >60.00)
Glucose, Bld: 102 mg/dL — ABNORMAL HIGH (ref 70–99)
Potassium: 4.1 mEq/L (ref 3.5–5.1)
Sodium: 135 mEq/L (ref 135–145)
Total Bilirubin: 1 mg/dL (ref 0.2–1.2)
Total Protein: 6.8 g/dL (ref 6.0–8.3)

## 2018-01-24 LAB — CBC WITH DIFFERENTIAL/PLATELET
Basophils Absolute: 0 10*3/uL (ref 0.0–0.1)
Basophils Relative: 0.7 % (ref 0.0–3.0)
Eosinophils Absolute: 0.1 10*3/uL (ref 0.0–0.7)
Eosinophils Relative: 1.5 % (ref 0.0–5.0)
HCT: 42.9 % (ref 39.0–52.0)
Hemoglobin: 15.2 g/dL (ref 13.0–17.0)
Lymphocytes Relative: 32.2 % (ref 12.0–46.0)
Lymphs Abs: 1.9 10*3/uL (ref 0.7–4.0)
MCHC: 35.5 g/dL (ref 30.0–36.0)
MCV: 90.3 fl (ref 78.0–100.0)
Monocytes Absolute: 0.6 10*3/uL (ref 0.1–1.0)
Monocytes Relative: 10.1 % (ref 3.0–12.0)
Neutro Abs: 3.3 10*3/uL (ref 1.4–7.7)
Neutrophils Relative %: 55.5 % (ref 43.0–77.0)
Platelets: 200 10*3/uL (ref 150.0–400.0)
RBC: 4.76 Mil/uL (ref 4.22–5.81)
RDW: 13.1 % (ref 11.5–15.5)
WBC: 6 10*3/uL (ref 4.0–10.5)

## 2018-01-24 LAB — PSA: PSA: 0.6 ng/mL (ref 0.10–4.00)

## 2018-01-24 LAB — TSH: TSH: 2.44 u[IU]/mL (ref 0.35–4.50)

## 2018-01-24 MED ORDER — MECLIZINE HCL 25 MG PO TABS: ORAL_TABLET | ORAL | 5 refills | Status: DC

## 2018-01-24 NOTE — Patient Instructions (Addendum)
Today we updated your med list in our EPIC system...    Continue your current medications the same...  We refilled your meds per request...  We gave you the 2019 FLU vaccine today...  Today we checked your follow up FASTING blood work...    We will contact you w/ the results when available...   We discussed getting a second opinion ORTHO eval from DrAlusio or DrOlin regarding your knee...  We discussed my up-coming retirement & suggestion to call [employer] office to establish w/ DrHunter or DrParker for your primary care needs starting in 2020...  [place],  It has been my great honor to have been your doctor over these many years!    Wishing you & your family good health & much happiness in the years to come.Marland KitchenMarland Kitchen

## 2018-01-24 NOTE — Progress Notes (Signed)
Subjective:    Patient ID: Richard Li, male    DOB: 17-Jun-1960, 57 y.o.   MRN: [id number]  HPI 57 y/o WM former heavy smoker w/ mild/mod airflow obstruction on Spirometry but min symptoms since he quit smoking in 2007; see prob list below>>   ~  [name]2009:  he was a very heavy smoker and quit in 2007... he has moderate COPD but remarkably preserved PFT's in the past... he was on Symbicort but stopped this on his own... ~  [id number]:  87yrROV- notes some SOB w/ activity in hot weather... he's tried ACaswellbut he states that he didn't like them & the TV advertisment scared him...  ~  Oct2011:  presents w/ 113mox left chest wall pain- CXR repeat> COPD, NAD & rib details neg; symptoms finally resolved after one month off work & time to heal rib fxs...  CXR 8/13 showed normal heart size, clear lungs, NAD...Marland KitchenMarland KitchenEKG 8/13 showed NSR, rate70, wnl, NAD...  LABS 8/13:  FLP- chol ok but TG=259;  Chems- wnl;  CBC- wnl;  TSH=2.11;  PSA=0.45;  UA- wnl   ~  December 25, 2012:  Yearly ROV & CPX> [name] a good year overall- he fell in his yard on his right arm 1 wk ago w/ pain in the elbow area & we discussed hot soaks, Mobic15 & OV w/ XRays by Richard Li. He also reports seeing Richard Li, Derm & started on Lamisil orally- needs LFTs checked & copy to Derm- OK... We reviewed the following medical problems during today's office visit >>     HxAR> he has seen Richard Li in the past w/ sinus turbinate reduction in 2002; asked to avoid antihist due to parotid dis...    Parotid Hypertrophy> known bilat parotid swelling, no change & he denies dry mouth symptoms or sequelae, rec for sialogogues...     COPD> former very heavy smoker, quit 2007; see prev CXR, CTChest, PFTs below; notes min cough, no sput, no hemoptysis, denies SOB, etc...    Hyperlipid> on low fat diet alone but remains overweight & is up 9# this yr; last FLP 8/13 showed TChol 139, TG 259, HDL 35, LDL 58; rec for better low fat diet &  get wt down.    Overweight> weight is up 9# to 246# this yr; BMI ~35 and we reviewed diet, exercise, & wt reduction strategies...    GI- GERD, needs screening colon> he uses OTC Prilosec prn; denies dysphagia, abd pain, n/v, c/d, blood seen; needs screening colon...     GU- he's had prev vasectomy by Richard Li 2012... Denies LTOS etc...    HxLBP> prev ortho eval by Richard Li... He uses OTC analgesics as needed...    Anxiety> on Valium5 as needed & requests refill...    Onychomycosis> he had eval by Richard Li, & was started on Lamisil orally,,,  We reviewed prob list, meds, xrays and labs> see below for updates >>   CXR 9/14 showed COPD, patchy opac RUL- ?inflamm, ?atx, ?other... We decided to treat w/ Mucinex, Fluids, & repeat CXR 52m3mo  LABS 9/14 (not fasting today):  Chems- ok x Na=130, LFTs= wnl;  CBC- wnl;  TSH=2.38;  PSA=0.43...  ADDENDUM>> follow up CXR 10/14 showed resolution of prev RUL opac; norm heart size, mild hyperinflation, NAD...  ~  June 03, 2014:  52m68mo & Richard Li recent URI symptoms, bilat parotid gland hypertrophy, [name]> he notes URI about 2 wks ago & went to  [facility] treated w/ ?antibiotic but he can't recall what & only sl better; then called here c/o TMJ pain & he was told Dentist or Oral surgeon required; we did agree to try hot soaks, Advil vs Aleve, & gave him a Pred dosepak- sl better; he is quite vague about his actual symptoms but he has long hx of bilat parotid hypertrophy & advised to use sialogogues, lozenges, warm compresses, etc (and he's been prev offered ENT consultation)... We reviewed the following medical problems during today's office visit >>     HxAR> he has seen Richard Li in the past w/ sinus turbinate reduction in 2002; asked to avoid antihist due to parotid dis...    Parotid Hypertrophy> known bilat parotid swelling, no change & he denies dry mouth symptoms or sequelae, rec for sialogogues, lozenges, warm compresses, f/u w/ ENT if  necessary...     COPD> former very heavy smoker, quit 2007; see prev CXR, CTChest, PFTs below; notes min cough, no sput, no hemoptysis, denies SOB, etc...    Hyperlipid> on low fat diet alone but remains overweight at 236# (BMI=34); last FLP 8/13 showed TChol 139, TG 259, HDL 35, LDL 58; rec for better low fat diet, get wt down, ret for another FLP.    Overweight> weight is 236# this yr; BMI ~34 and we reviewed diet, exercise, & wt reduction strategies...    GI- GERD, needs screening colon> he uses OTC Prilosec prn; denies dysphagia, abd pain, n/v, c/d, blood seen; he had screening colonoscopy 12/14 by Richard Li- NEG, wnl & rec to f/u 10 yrs.    GU- he's had prev vasectomy by Richard Li 2012... Denies LTOS etc...    HxLBP> prev ortho eval by Richard Li... He uses OTC analgesics as needed...    Anxiety> on Valium5 as needed & requests refill...    Onychomycosis> he had eval by Richard Li, Richard Li, & was prev rx w/ Lamisil; then he saw Podiarty Richard Li & treated w/ Jublia... We reviewed prob list, meds, xrays and labs> see below for updates >>  PLAN>> his symptoms are quite vague but he is concerned; rec to use the Pred dosepak as prescribed, warm compresses, sialogogues/ lozenges, Advil vs Aleve; rec f/u appt w/ ENT Richard Li for his opinion & pt will consider this; BP is elev and we decided to start treatment w/ MetopER50, no salt in diet, & follow up BP here...  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 we reviewed the following medical problems during today's office visit >>    HxAR> he has seen Richard Li in the past w/ sinus turbinate reduction in 2002; asked to  avoid antihist due to parotid dis, ok to use nasal Saline, OTC Flonase, etc...    Parotid Hypertrophy> known bilat parotid swelling, no change & he notes some dry mouth symptoms or sequelae, rec for sialogogues, Biotene, lozenges, warm compresses, f/u w/ ENT if necessary...     COPD> former heavy smoker, quit 2007; see prev CXR, CTChest, PFTs below; notes min cough, no sput, no hemoptysis, but says incr SOB "like I can't  get a DB, not satisfied breathing"=> rec to try PROAIR prn & ATIVAN 72m prn.    HBP>  As above- we discussed lifestyle mod w/ diet/ exercise/ wt reduction & decided to place him on Amlodipine 524md w/ careful f/u BPs at home & here...    Hyperlipid> on low fat diet alone but remains overweight at 236# (BMI=34); FLP 12/16 showed TChol 141, TG 296, HDL 31, LDL 58; rec for better low fat diet, get wt down in order to improve the TG & HDL.    Overweight> weight is 236# this yr; BMI ~34 and we reviewed diet, exercise, & wt reduction strategies...    GI- GERD, needs screening colon> he uses OTC Prilosec prn; denies dysphagia, abd pain, n/v, c/d, blood seen; he had screening colonoscopy 12/14 by Richard Li- NEG, wnl & rec to f/u 10 yrs.    GU- he's had prev vasectomy by Richard Li 2012... Denies LTOS etc...    HxLBP> prev ortho eval by Richard Li... He uses OTC analgesics as needed...    Anxiety> prev on Valium5 as needed in past; we discussed use of Lorazepam 76m12m1/2 to 1 tab Tid as needed for dyspnea & anxiety...     Onychomycosis> he had eval by DerPayton MccallumrJones, & was prev rx w/ Lamisil; then he saw Podiarty Richard Li & treated w/ Jublia... EXAM shows Afeb, VSS, O2sat=98% on RA,  HEENT- marked parotid hypertrophy, mallamapati2;  Chest- clear w/o w/r/r;  Heart- RR w/o m/r/g;  Abd- neg, nontender, no masses;  Ext- neg w/o c/c/e;  Neuro- neg w/o focal abn...   CXR 03/30/15>  Norm heart size, lungs are clear x some scarring at right base, NAD...   Spirometry 03/30/15>  FVC=5.19 (107%), FEV1=3.22  (84%), %1sec=62, mid-flows reduced at 41% predicted... This is c/w mild-mod airflow obstruction & we discussed PROAIR-HFA for prn use...  LABS 03/30/15>  FLP- Chol ok but TG elev;  Chems- wnl;  CBC- wnl;  TSH=2.02;  PSA=0.46 IMP/PLAN>>  Richard Li for his annual CPX w/ report of elevBP & needs Rx for DOT- discussed lifestyle mod w/ DIET/ EXERCISE/ wt reduction, and we are starting low dose Amlodipine 5mg35m he will monitor BP at home & call for issues  In addition his Hyperlipidemia is unchanged, so is his weight etc- needs wt reduction & a low fat diet to effect improvement in these numbers;  Almost as an afterthought he noted SOB/DOE that seems to take 2 forms: 1) SOB w/ activity & wheezing noted- Rx w/ ProairHFA rescue inhaler 2sp Q6H as needed;  2) SOB at rest "like I can't get a DB, trouble getting the air "IN"- we discussed Rx w/ Lorazepam 76mg-45m2 to 1 tab Tid prn...   ~  February 16, 2016:  176mo 59mo Asael Nivekns w/ several concerns> 1) he was switched from Lorazepam to Desyrel over the phone to help him rest but the Desyrel actually doesn't work for him & the Ativan was better when taking 76mg in64me eve to relax and another 76mg at 46mtime for sleep which works well by his description; we reviewed Rx for Lorazepam 76mg- 1/276m 1 tab up to Tid as needed...  2) he has some discomfort in his left shoulder ?injury several months ago & he takes Aleve daily, we will add Voltaren gel & refer to Ortho when he is ready...     Breathing is stable- denies cough, sput, ch in DOE/SOB, edema, etc... He has declined to use regular inhalers and has NOT  had resp exac; he saw DrWet 08/2015 in my absence w/ sinus infection (Rx Levaquin, Pred, & improved)..    BP appears sl evel at 160/82 & pt stopped the [facility] on his own (we started 12/16)- he prefers to control w/ DIET but told he will need to lose the weight...    Chol looks ok on diet alone but TG elev & HDL is low- we discussed need to get on diet & get the  weight down if he wants to avoid meds...    Anxiety/ Insomnia> the Lorazepam 2m works well for him, rests satis, wakes refreshed, no daytime hypersomnolence...  EXAM shows Afeb, VSS, O2sat=98% on RA,  HEENT- marked parotid hypertrophy, mallamapati2;  Chest- clear w/o w/r/r;  Heart- RR w/o m/r/g;  Abd- neg, nontender, no masses;  Ext- neg w/o c/c/e;  Neuro- neg w/o focal abn.. IMP/PLAN>>  Richard Li that he is doing satis on the Lorazepam in the eve to help him rest, medically stable; he injured his left shoulder several months ago & needs ortho eval; add Voltaren Gel to his Aleve and rec OV w/ Ortho for assessment...   ~  February 27, 2017:  Yearly ROV & CPX> Richard Li a good yr overall- notes recent sinus infection, but says he's better now w/ Augmentin & Pred dosepak called in last month; he notes min cough/ sput/ DOE & recently built a chicken barn & did ok w/ the construction- ROS is otherw neg... He has visited DrRegal, Podiatrist w/ ingrown toenail treated surgically...  We reviewed the following medical problems during today's office visit>      HxAR> he has seen Richard Li in the past w/ sinus turbinate reduction in 2002; asked to avoid antihist due to parotid dis, ok to use nasal Saline, OTC Flonase, etc...    Parotid Hypertrophy> known bilat parotid swelling, no change & he notes some dry mouth symptoms & rec for sialogogues, Biotene, lozenges, warm compresses, f/u w/ ENT if necessary...     COPD> former heavy smoker, quit 2007; see prev CXR, CTChest, PFTs; notes min cough, no sput, no hemoptysis, but says incr SOB "like I can't get a DB, not satisfied breathing"=> rec to try PROAIR prn & ATIVAN 177mprn.    Mild HBP>  we discussed lifestyle mod w/ diet/ exercise/ wt reduction & decided to place him on Amlodipine 104m27m w/ careful f/u BPs at home & here=> improved to 140/80 range.    Hyperlipid> on low fat diet alone but remains overweight at 218# (BMI=34); FLP 11/18 showed TChol 124, TG  245, HDL 41, LDL 50; rec for better low fat diet, get wt down in order to improve the TG & HDL.    Overweight> weight is 236# this yr; BMI ~34 and we reviewed diet, exercise, & wt reduction strategies...    GI- GERD, neg screening colon 2014> he uses OTC Prilosec prn; denies dysphagia, abd pain, n/v, c/d, blood seen; he had screening colonoscopy 12/14 by Richard Li- NEG, wnl & rec to f/u 10 yrs.    GU- he's had prev vasectomy by Richard Li 2012, PSAs have been wnl... Denies LTOS etc...    HxLBP> prev ortho eval by Richard Li... He uses OTC analgesics as needed...    Anxiety> prev on Valium5 as needed in past; we discussed use of Lorazepam 1mg67m/2 to 1 tab Tid as needed for dyspnea & anxiety...     Onychomycosis & ingrown toenail> he had eval by Derm, Richard Li, & was prev rx w/ Lamisil;  then he saw Podiarty Richard Li/ [facility] & treated w/ Jublia... EXAM shows Afeb, VSS, O2sat=100% on RA,  HEENT- marked parotid hypertrophy, mallamapati2;  Chest- clear w/o w/r/r;  Heart- RR w/o m/r/g;  Abd- neg, nontender, no masses;  Ext- neg w/o c/c/e;  Neuro- neg w/o focal abn..  CXR 02/27/17 showed norm heart size, mild aortic atherosclerosis, hyperinflation/ COPD w/ scarring on the right w/ blunt right angle- stable & NAD  EKG 02/27/17 showed NSR, rate67, IRBBB in V1, otherw wnl/ NAD...  LABS 02/27/17>  FLP- Chol ok on diet alone but TG=245;  Chems- wnl w/ BS=96, Cr=0.85, LFTs wnl;  CBC- wnl w/ Hg=16.4;  TSH=2.08,  PSA=0.51 IMP/PLAN>>  Richard Li remains stable at 56- REC to continue same meds (Amlod5, Lorazepam prn) and continue to work on diet, exercise, wt reduction;  We reviewed the need for low sodium & low fat diets...  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     Problem list reviewed>  AR on antihist + Flonase + Mucinex prn;  Former heavy smoker, mild underlying COPD but asymptomatic & NOT on inhalers (his choice- has ProairHFA prn use);  ROS otherw neg...  EXAM shows Afeb, VSS- BP=140-150/70-80, O2sat=99% on RA,  HEENT- marked parotid hypertrophy, mallamapati2;  Chest- clear w/o w/r/r;  Heart- RR w/o m/r/g;  Abd- neg, nontender, no masses;  Ext- neg w/o c/c/e;  Neuro- neg.  IMP/PLAN>>  We discussed Richard Li's HBP & the dizziness- likely BPPV>  Decided to incr the Losartan to 101mdaily at this point & monitor BP at home (could add-back the Amlod5 if needed vs other options);  His dizziness sounds like BPPV & we reviewed the epley maneuvers, gave him a hand out & instructed re: you-tube videos avail on the internet.  Finally wrote for Antivert 2599m6H prn trial;  He has rov sched in about 6wks for recheck...   ~  Sep 06, 2017:  74mo6mo & Lex reports that his dizziness has improved ~90% and BP doing better on home BP checks w/ the CozaTable Rockoday he is noting nasal congestion, drainage, blowing beige/ thick mucus- no blood; he says he went to a [facility] x2 & given ZPak but no better => we discussed Rx w/ 1) nasal regimen: Allegra180 Qam, Nasal Saline Q1-2H during the day, and Flonase- 2sp each nostril Qhs; 2) Depo80 + Pred 20mg48m taper...  We reviewed the following medical problems during today's office visit>      HxAR> he has seen Richard Li in the past w/ sinus turbinate reduction in 2002; asked to avoid antihist due to parotid dis, ok to use nasal Saline, OTC Flonase, etc...    Parotid Hypertrophy> known bilat parotid swelling, no change & he notes some  dry mouth symptoms & rec for sialogogues, Biotene, lozenges, warm compresses, f/u w/ ENT if necessary...Marland KitchenMarland Kitchen  COPD> former heavy smoker, quit 2007; see prev CXR, CTChest, PFTs; notes min cough, no sput, no hemoptysis, but says incr SOB "like I can't get a DB, not satisfied breathing"=> rec to try PROAIR prn & ATIVAN 69m prn.    HBP>  we discussed lifestyle mod w/ diet/ exercise/ wt reduction & prev rx w/ Amlodipine5 but he felt it wasn't working for his BP; switched to Cozaar50=>100/d & doing better now...    Hyperlipid> on low fat diet alone but remains overweight at 222# (BMI=34); FLP 11/18 showed TChol 124, TG 245, HDL 41, LDL 50; rec for better low fat diet, get wt down in order to improve the TG & HDL.    Overweight> weight is 222# & w/ BMI ~34 and we reviewed diet, exercise, & wt reduction strategies...    GI- GERD, neg screening colon 2014> he uses OTC Prilosec prn; denies dysphagia, abd pain, n/v, c/d, blood seen; he had screening colonoscopy 12/14 by Richard Li- NEG, wnl & rec to f/u 10 yrs.    GU- he's had prev vasectomy by Richard Li 2012, PSAs have been wnl... Denies LTOS etc...    HxLBP> prev ortho eval by Richard Li... He uses OTC analgesics as needed...    Anxiety> prev on Valium5 as needed in past; we discussed use of Lorazepam 185m 1/2 to 1 tab Tid as needed for dyspnea & anxiety...     Onychomycosis & ingrown toenail> he had eval by Richard Li, & was prev rx w/ Lamisil; then he saw Podiarty Richard Li/ [facility] & treated w/ Jublia... EXAM shows Afeb, VSS, O2sat=96% on RA,  HEENT- marked parotid hypertrophy, mallamapati2;  Chest- clear w/o w/r/r;  Heart- RR w/o m/r/g;  Abd- neg, nontender, no masses;  Ext- neg w/o c/c/e;  Neuro- neg w/o focal abn.. IMP/PLAN>>  We reviewed the need for diet/ exercise/ wt reduction;  Continue Losar100 + no salt & monitor BP at home;  We reviewed treatment for sinuses/ nasal hygiene using Allegra180 Qam, Nasal Saline Q1-2H during the day, and Flonase- 2sp each  nostril Qhs; For his acute URI/ nasal inflamm symptoms=> Depo80 + Pred 2070m5d taper...   ~  January 24, 2018:  63mo9mo & general medical follow up visit>               Problem List:     Bilat PAROTID GLAND HYPERTROPHY >> known parotid hypertrophy, no nodules palpated and normal salivary secretions & dentition; we reviewed the rec for sialogogues such as lemon drops etc... ~  2/16: he is c/o his parotid hypertrophy but it seems the same on exam from rev evaluations; rec warm compressed, sialogogues/ lozenges, Advil/ Aleve, & we wrote for an empiric Pred dosepak trial; he will call ENT for their review...  CHEST PAIN, ATYPICAL (ICD-786.59) - no prev hx of cardiac problems... risk factors include: +FamHx, Smoking Hx, low HDL, male gender... we will screen w/ a Stress Myoview... he saw [name] 5/08- EKG w/ NSR, IRBBB, NSSTTWA... Myoview was done 8/09 & showed good exerc capacity, normal BP response, no signif EKG changes, normal images w/o ischemia/ scar & EF= 58%... ~  EKG 8/11 shows NSR, WNL... ~[name] 8/13:  Denies CP, palpit, SOB, edema; BP= 150/80 after rest; exercise= yard work; he knows to avoid salt & get wt down...  ALLERGIC RHINITIS (ICD-477.9) - uses OTC meds as needed... prev ENT surgery from DrCrTempleton2002 w/ turbinate reduction... he has prominent parotid hypertrophy bilat==> & we discussed Rx w/ sialogogues etc...  COPD (  ICD-496) - former heavy smoker and quit in 2007... chronic bronchitic symptoms... prev on Symbicort but he stopped this on his own & refuses Spiriva due to side effects discussed on TV ads...  ABNORMAL CHEST XRAY (ICD-793.1) - prev CXR's w/ hyperinflation c/w COPD, 1cm RLL density without change- likely scarring... ~  CTChest 11/07 showed hyperinflation w/ emphysematous changes in the upper lobes, 10m RML nodule without change, several other scattered sm nodules, pleural thickening, and ca++ in Abd Ao...  ~  baseline CXR shows hyperinflation, some scarring,  NAD... ~  PFT's 7/09 showed FVC= 6.61 (135%), FEV1=3.88 (97%), %1sec=59, mid-flows= 44%. ~  CXR 8/11 showed COPD w/ hyperaeration, min scarring, NAD> confirmed 10/11 & Left Rib details= neg. ~  8/11:  started on DULERA 100-5 2spBid==> he stopped "I don't like meds" ~  CXR 8/13 showed normal heart size, clear lungs, NAD..[name] ~  CXR 9/14 showed COPD, patchy opac RUL- ?inflamm, ?atx, ?other... We decided to treat w/ Mucinex, Fluids, & repeat CXR 110mo f/u film showed resolution of RUL opacity...  HYPERLIPIDEMIA (ICD-272.4) - on diet therapy alone w/ incr TG assoc w/ recent weight gain... discussed diet + exercise. ~  FLP 8/09 (wt=203#) showed TChol 139, TG 116, HDL 35, LDL 81 ~  FLP 8/11 (wt=230#) showed TChol 145, TG 247, HDL 33, LDL 68... advised diet + exercise, get wt down. ~  FLP 8/13 (wt=237#) showed TChol 139, TG 259, HDL 35, LDL 58 ~  9/14: pt not fasting for f/u FLP- he's gained 9# & we reviewed need for low carb, low fat diet & wt reduction...  OBESITY >> we reviewed diet recommendations, exercise prescription, & wt reduction strategies.. ~  Weight 8/13 = 237#, BMI= 34 ~  Weight 9/14 = 246# ~  Weight 2/16 = 236#  GERD (ICD-530.81) - on PRILOSEC 2083mrn for reflux symptoms... ~  Screening colonoscopy 12/14 by Richard Li was NEG- wnl & f/u rec in 10 yrs...  GU> He had Vasectomy from Richard Li in Jan2012...  BACK PAIN, LUMBAR (ICD-724.2) - prev eval from Richard Li...  ANXIETY (ICD-300.00) - on VALIUM 5mg54m needed... he states Xanax is too strong...  Health Maintenance - FlexHahirawed rectal fissure otherw neg...   Current Medications, Allergies, Past Medical History, Past Surgical History, Family History, and Social History were reviewed in [facility].     Past Surgical History:  Procedure Laterality Date  . APPENDECTOMY    . COLONOSCOPY  15 years ago  . NASAL TURBINATE REDUCTION  2002   Dr.Crossley  . VASECTOMY      Outpatient Encounter  Medications as of 01/24/2018  Medication Sig  . albuterol (PROAIR HFA) 108 (90 Base) MCG/ACT inhaler Inhale 1-2 puffs every 6 (six) hours as needed into the lungs for wheezing or shortness of breath.  . guaiFENesin (MUCINEX) 600 MG 12 hr tablet Take 600 mg by mouth daily. 1 tab daily  . ibuprofen (ADVIL,MOTRIN) 200 MG tablet Take 600 mg by mouth daily. As needed  . LORazepam (ATIVAN) 1 MG tablet TAKE 1 TABLET BY MOUTH THREE TIMES DAILY AS NEEDED FOR ANXIETY  . losartan (COZAAR) 100 MG tablet TAKE 1 TABLET BY MOUTH ONCE DAILY  . meclizine (ANTIVERT) 25 MG tablet TAKE 1 TABLET BY MOUTH EVERY 6 HOURS AS NEEDED FOR DIZZINESS  . predniSONE (DELTASONE) 20 MG tablet Take as directed.  . [DISCONTINUED] meclizine (ANTIVERT) 25 MG tablet TAKE 1 TABLET BY MOUTH EVERY 6 HOURS AS NEEDED FOR DIZZINESS   No  facility-administered encounter medications on file as of 01/24/2018.     Allergies  Allergen Reactions  . Codeine Itching    Immunization History  Administered Date(s) Administered  . Influenza Split 01/25/2012  . Influenza Whole 02/13/2009, 01/25/2010  . Influenza,inj,Quad PF,6+ Mos 03/30/2015, 02/16/2016, 02/27/2017, 01/24/2018  . Tdap 12/12/2011    Current Medications, Allergies, Past Medical History, Past Surgical History, Family History, and Social History were reviewed in [employer] record.    Review of Systems        See HPI - all other systems neg except as noted...       The patient complains of chest pain and dyspnea on exertion.  The patient denies anorexia, fever, weight loss, weight gain, vision loss, decreased hearing, hoarseness, syncope, peripheral edema, prolonged cough, headaches, hemoptysis, abdominal pain, melena, hematochezia, severe indigestion/heartburn, hematuria, incontinence, muscle weakness, suspicious skin lesions, transient blindness, difficulty walking, depression, unusual weight change, abnormal bleeding, enlarged lymph nodes, and angioedema.      Objective:   Physical Exam      WD, WN, 57 y/o WM in NAD... GENERAL:  Alert & oriented; pleasant & cooperative... He has very large symmetric bilat parotid glands, no nodules or tenderness... HEENT:  Bel[place]/AT, EOM-wnl, PERRLA,  EACs-clear, TMs-wnl, NOSE- turbinates boggy, no drainage; THROAT-clear & wnl;  NECK:  Supple w/ fairROM; no JVD; normal carotid impulses w/o bruits; no thyromegaly or nodules palpated; no lymphadenopathy. CHEST:  Decr BS bilat but clear to P & A; without wheezes/ rales/ or rhonchi heard...  HEART:  Regular Rhythm; without murmurs/ rubs/ or gallops detected... ABDOMEN:  Soft & nontender; normal bowel sounds; no organomegaly or masses palpated... EXT: without deformities, mild arthritic changes; no varicose veins/ venous insuffic/ or edema. NEURO:  CN's intact; motor testing normal; sensory testing normal; gait normal & balance OK. DERM:  No lesions noted; no rash, several tatoos...  RADIOLOGY DATA:  Reviewed in the EPIC EMR & discussed w/ the patient...  LABORATORY DATA:  Reviewed in the EPIC EMR & discussed w/ the patient...   Assessment & Plan:    COPD, ex-smoker, Hx Abn CXR>  He refuses the [facility] etc because he doesn't like meds and doesn't think he needs them; ?RUL opac- Rx mucinex fluids, f/u CXR=> resolved... 03/30/15>    Richard Li returns for his annual CPX w/ report of elevBP & needs Rx for DOT- discussed lifestyle mod w/ DIET/ EXERCISE/ wt reduction, and we are starting low dose Amlodipine 73m/d; he will monitor BP at home & call for issues  In addition his Hyperlipidemia is unchanged, so is his weight etc- needs wt reduction & a low fat diet to effect improvement in these numbers;  Almost as an afterthought he noted SOB/DOE that seems to take 2 forms: 1) SOB w/ activity & wheezing noted- Rx w/ ProairHFA rescue inhaler 2sp Q6H as needed;  2) SOB at rest "like I can't get a DB, trouble getting the air "IN"- we discussed Rx w/ Lorazepam 123m 1/2 to 1 tab Tid prn  => this really helped... 02/27/17>   Richard Li stable at 5633REC to continue same meds (Amlod5, Lorazepam prn) and continue to work on diet, exercise, wt reduction;  We reviewed the need for low sodium & low fat diets. 08/03/17>   We discussed Richard Li's HBP & the dizziness- likely BPPV>  Decided to incr the Losartan to 10093maily at this point & monitor BP at home (could add-back the Amlod5 if needed vs other options);  His  dizziness sounds like BPPV & we reviewed the epley maneuvers, gave him a hand out & instructed re: you-tube videos avail on the internet.  Finally wrote for Antivert 27m Q6H prn trial; 09/06/17>   We reviewed the need for diet/ exercise/ wt reduction;  Continue Losar100 + no salt & monitor BP at home;  We reviewed treatment for sinuses/ nasal hygiene using Allegra180 Qam, Nasal Saline Q1-2H during the day, and Flonase- 2sp each nostril Qhs; For his acute URI/ nasal inflamm symptoms=> Depo80 + Pred 218m 5d taper  Hx AR, Hx sinusitis, bilat parotid hypertrophy>  Prev treated sinus w/ Depo, Pred, Levaquin, Mucinex, etc... rec to avoid antihist, use sialogogues etc...  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   HYPERLIPIDEMIA>  He has endeavored to control this w/ diet alone, unfortunately he has gained wt since stopping the smoking;  Needs f/u FLP & consideration of med rx...  GERD>  He continues on Prilosec as needed...  LBP>  Stable w/ prev eval from Richard Li...  Anxiety>  He uses Valium 44m58mrn...   Patient's Medications  New Prescriptions   No medications on file  Previous Medications   ALBUTEROL (PROAIR HFA) 108 (90 BASE) MCG/ACT INHALER    Inhale 1-2 puffs  every 6 (six) hours as needed into the lungs for wheezing or shortness of breath.   GUAIFENESIN (MUCINEX) 600 MG 12 HR TABLET    Take 600 mg by mouth daily. 1 tab daily   IBUPROFEN (ADVIL,MOTRIN) 200 MG TABLET    Take 600 mg by mouth daily. As needed   LORAZEPAM (ATIVAN) 1 MG TABLET    TAKE 1 TABLET BY MOUTH THREE TIMES DAILY AS NEEDED FOR ANXIETY   LOSARTAN (COZAAR) 100 MG TABLET    TAKE 1 TABLET BY MOUTH ONCE DAILY   PREDNISONE (DELTASONE) 20 MG TABLET    Take as directed.  Modified Medications   Modified Medication Previous Medication   MECLIZINE (ANTIVERT) 25 MG TABLET meclizine (ANTIVERT) 25 MG tablet      TAKE 1 TABLET BY MOUTH EVERY 6 HOURS AS NEEDED FOR DIZZINESS    TAKE 1 TABLET BY MOUTH EVERY 6 HOURS AS NEEDED FOR DIZZINESS  Discontinued Medications   No medications on file

## 2018-01-25 ENCOUNTER — Other Ambulatory Visit: Payer: BLUE CROSS/BLUE SHIELD

## 2018-01-25 ENCOUNTER — Other Ambulatory Visit (INDEPENDENT_AMBULATORY_CARE_PROVIDER_SITE_OTHER): Payer: BLUE CROSS/BLUE SHIELD

## 2018-01-25 ENCOUNTER — Other Ambulatory Visit: Payer: Self-pay | Admitting: Pulmonary Disease

## 2018-01-25 ENCOUNTER — Ambulatory Visit: Payer: BLUE CROSS/BLUE SHIELD | Admitting: Pulmonary Disease

## 2018-01-25 DIAGNOSIS — E785 Hyperlipidemia, unspecified: Secondary | ICD-10-CM

## 2018-01-25 LAB — LIPID PANEL
Cholesterol: 167 mg/dL (ref 0–200)
HDL: 46.7 mg/dL (ref >39.00)
NonHDL: 120.48
Total CHOL/HDL Ratio: 4
Triglycerides: 297 mg/dL — ABNORMAL HIGH (ref 0.0–149.0)
VLDL: 59.4 mg/dL — ABNORMAL HIGH (ref 0.0–40.0)

## 2018-01-25 LAB — LDL CHOLESTEROL, DIRECT: Direct LDL: 78 mg/dL

## 2018-01-26 ENCOUNTER — Other Ambulatory Visit: Payer: Self-pay | Admitting: Pulmonary Disease

## 2018-01-26 MED ORDER — FENOFIBRATE 160 MG PO TABS: 160.0000 mg | ORAL_TABLET | Freq: Every day | ORAL | 3 refills | Status: DC

## 2018-01-27 ENCOUNTER — Other Ambulatory Visit: Payer: Self-pay | Admitting: Pulmonary Disease

## 2018-02-08 ENCOUNTER — Ambulatory Visit: Payer: BLUE CROSS/BLUE SHIELD | Admitting: Pulmonary Disease

## 2018-02-21 DIAGNOSIS — S83222A Peripheral tear of medial meniscus, current injury, left knee, initial encounter: Secondary | ICD-10-CM | POA: Diagnosis not present

## 2018-02-21 DIAGNOSIS — M25562 Pain in left knee: Secondary | ICD-10-CM | POA: Diagnosis not present

## 2018-02-27 ENCOUNTER — Ambulatory Visit: Payer: BLUE CROSS/BLUE SHIELD | Admitting: Pulmonary Disease

## 2018-03-25 ENCOUNTER — Other Ambulatory Visit: Payer: Self-pay | Admitting: Pulmonary Disease

## 2018-03-26 ENCOUNTER — Other Ambulatory Visit: Payer: Self-pay | Admitting: Pulmonary Disease

## 2018-03-26 NOTE — Telephone Encounter (Signed)
Dr. Lenna Gilford, please advise if we can refill this medication, thank you.

## 2018-03-29 ENCOUNTER — Telehealth: Payer: Self-pay | Admitting: Pulmonary Disease

## 2018-03-29 MED ORDER — FENOFIBRATE 160 MG PO TABS: 160.0000 mg | ORAL_TABLET | Freq: Every day | ORAL | 3 refills | Status: DC

## 2018-03-29 MED ORDER — LOSARTAN POTASSIUM 100 MG PO TABS: 100.0000 mg | ORAL_TABLET | Freq: Every day | ORAL | 3 refills | Status: DC

## 2018-03-29 MED ORDER — ALBUTEROL SULFATE HFA 108 (90 BASE) MCG/ACT IN AERS: 1.0000 | INHALATION_SPRAY | Freq: Four times a day (QID) | RESPIRATORY_TRACT | 10 refills | Status: DC | PRN

## 2018-03-29 NOTE — Telephone Encounter (Signed)
Called and spoke with Patient. Went through [employer] and sent refills in for meds prescribed by Dr. Lenna Gilford.  Refills sent to preferred pharmacy, [facility], [address].  Nothing further needed.

## 2018-04-13 DIAGNOSIS — M25562 Pain in left knee: Secondary | ICD-10-CM | POA: Diagnosis not present

## 2018-05-30 DIAGNOSIS — M25562 Pain in left knee: Secondary | ICD-10-CM | POA: Diagnosis not present

## 2018-06-27 DIAGNOSIS — I10 Essential (primary) hypertension: Secondary | ICD-10-CM | POA: Diagnosis not present

## 2018-06-27 DIAGNOSIS — J0110 Acute frontal sinusitis, unspecified: Secondary | ICD-10-CM | POA: Diagnosis not present

## 2018-07-06 ENCOUNTER — Other Ambulatory Visit: Payer: Self-pay

## 2018-07-06 ENCOUNTER — Encounter: Payer: Self-pay | Admitting: Family Medicine

## 2018-07-06 ENCOUNTER — Telehealth: Payer: Self-pay | Admitting: Family Medicine

## 2018-07-06 ENCOUNTER — Ambulatory Visit: Payer: BLUE CROSS/BLUE SHIELD | Admitting: Family Medicine

## 2018-07-06 VITALS — BP 132/76 | HR 85 | Temp 98.3°F | Ht 70.0 in | Wt 229.6 lb

## 2018-07-06 DIAGNOSIS — J0100 Acute maxillary sinusitis, unspecified: Secondary | ICD-10-CM | POA: Diagnosis not present

## 2018-07-06 DIAGNOSIS — Z79899 Other long term (current) drug therapy: Secondary | ICD-10-CM | POA: Diagnosis not present

## 2018-07-06 DIAGNOSIS — F411 Generalized anxiety disorder: Secondary | ICD-10-CM

## 2018-07-06 MED ORDER — ESCITALOPRAM OXALATE 10 MG PO TABS: ORAL_TABLET | ORAL | 3 refills | Status: DC

## 2018-07-06 MED ORDER — AZITHROMYCIN 250 MG PO TABS: ORAL_TABLET | ORAL | 0 refills | Status: DC

## 2018-07-06 NOTE — Patient Instructions (Addendum)
-I am recommending that you start lexapro to help with anxiety control in addition to your lorazepam -I would like to reduce the amount of lorazepam you are requiring each day.  You are fine to continue at bedtime but work on reducing the amount you need during the day.  The Lexapro should help some with that.  -Once your urine drug screen returns I will send over the prescription for lorazepam for you.  This typically takes 3-4 days. -I will plan to see you back in about 6-8 weeks.     Generalized Anxiety Disorder, Adult Generalized anxiety disorder (GAD) is a mental health disorder. People with this condition constantly worry about everyday events. Unlike normal anxiety, worry related to GAD is not triggered by a specific event. These worries also do not fade or get better with time. GAD interferes with life functions, including relationships, work, and school. GAD can vary from mild to severe. People with severe GAD can have intense waves of anxiety with physical symptoms (panic attacks). What are the causes? The exact cause of GAD is not known. What increases the risk? This condition is more likely to develop in:  Women.  People who have a family history of anxiety disorders.  People who are very shy.  People who experience very stressful life events, such as the death of a loved one.  People who have a very stressful family environment. What are the signs or symptoms? People with GAD often worry excessively about many things in their lives, such as their health and family. They may also be overly concerned about:  Doing well at work.  Being on time.  Natural disasters.  Friendships. Physical symptoms of GAD include:  Fatigue.  Muscle tension or having muscle twitches.  Trembling or feeling shaky.  Being easily startled.  Feeling like your heart is pounding or racing.  Feeling out of breath or like you cannot take a deep breath.  Having trouble falling asleep or  staying asleep.  Sweating.  Nausea, diarrhea, or irritable bowel syndrome (IBS).  Headaches.  Trouble concentrating or remembering facts.  Restlessness.  Irritability. How is this diagnosed? Your health care provider can diagnose GAD based on your symptoms and medical history. You will also have a physical exam. The health care provider will ask specific questions about your symptoms, including how severe they are, when they started, and if they come and go. Your health care provider may ask you about your use of alcohol or drugs, including prescription medicines. Your health care provider may refer you to a mental health specialist for further evaluation. Your health care provider will do a thorough examination and may perform additional tests to rule out other possible causes of your symptoms. To be diagnosed with GAD, a person must have anxiety that:  Is out of his or her control.  Affects several different aspects of his or her life, such as work and relationships.  Causes distress that makes him or her unable to take part in normal activities.  Includes at least three physical symptoms of GAD, such as restlessness, fatigue, trouble concentrating, irritability, muscle tension, or sleep problems. Before your health care provider can confirm a diagnosis of GAD, these symptoms must be present more days than they are not, and they must last for six months or longer. How is this treated? The following therapies are usually used to treat GAD:  Medicine. Antidepressant medicine is usually prescribed for long-term daily control. Antianxiety medicines may be added in severe  cases, especially when panic attacks occur.  Talk therapy (psychotherapy). Certain types of talk therapy can be helpful in treating GAD by providing support, education, and guidance. Options include: ? Cognitive behavioral therapy (CBT). People learn coping skills and techniques to ease their anxiety. They learn to  identify unrealistic or negative thoughts and behaviors and to replace them with positive ones. ? Acceptance and commitment therapy (ACT). This treatment teaches people how to be mindful as a way to cope with unwanted thoughts and feelings. ? Biofeedback. This process trains you to manage your body's response (physiological response) through breathing techniques and relaxation methods. You will work with a therapist while machines are used to monitor your physical symptoms.  Stress management techniques. These include yoga, meditation, and exercise. A mental health specialist can help determine which treatment is best for you. Some people see improvement with one type of therapy. However, other people require a combination of therapies. Follow these instructions at home:  Take over-the-counter and prescription medicines only as told by your health care provider.  Try to maintain a normal routine.  Try to anticipate stressful situations and allow extra time to manage them.  Practice any stress management or self-calming techniques as taught by your health care provider.  Do not punish yourself for setbacks or for not making progress.  Try to recognize your accomplishments, even if they are small.  Keep all follow-up visits as told by your health care provider. This is important. Contact a health care provider if:  Your symptoms do not get better.  Your symptoms get worse.  You have signs of depression, such as: ? A persistently sad, cranky, or irritable mood. ? Loss of enjoyment in activities that used to bring you joy. ? Change in weight or eating. ? Changes in sleeping habits. ? Avoiding friends or family members. ? Loss of energy for normal tasks. ? Feelings of guilt or worthlessness. Get help right away if:  You have serious thoughts about hurting yourself or others. If you ever feel like you may hurt yourself or others, or have thoughts about taking your own life, get help  right away. You can go to your nearest emergency department or call:  Your local emergency services (911 in the [place]  A suicide crisis helpline, such as the [place] at (806)493-0789. This is open 24 hours a day. Summary  Generalized anxiety disorder (GAD) is a mental health disorder that involves worry that is not triggered by a specific event.  People with GAD often worry excessively about many things in their lives, such as their health and family.  GAD may cause physical symptoms such as restlessness, trouble concentrating, sleep problems, frequent sweating, nausea, diarrhea, headaches, and trembling or muscle twitching.  A mental health specialist can help determine which treatment is best for you. Some people see improvement with one type of therapy. However, other people require a combination of therapies. This information is not intended to replace advice given to you by your health care provider. Make sure you discuss any questions you have with your health care provider. Document Released: 08/06/2012 Document Revised: 03/01/2016 Document Reviewed: 03/01/2016 Elsevier Interactive Patient Education  2019 [employer].

## 2018-07-06 NOTE — Progress Notes (Signed)
Richard Li - 58 y.o. male MRN [id number]  Date of birth: 10-27-1960  Subjective Chief Complaint  Patient presents with  . Establish Care    NP/ sinus issues- cough/cong/throat drainage-yellow/green,ABX given in Urgent care    HPI Richard Li is a 58 y.o. male here today to establish with new pcp.  His previous PCP has retired.  He would like to discuss sinus congestion and anxiety today.    -Sinus congestion:  Sinus congestion for about 10 days with pain and yellow/green drainage.  Rx for doxycycline given at urgent care previously but hasn't had much improvement.  He denies chest pain, shortness of breath, fever, chills.  He is using flonase daily.    -anxiety:  Anxiety currently managed with lorazepam TID.  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  Denies over-sedation.  He reports trying alprazolam in the past but this made him too drowsy.  He has never tried SSRI for management of his anxiety.  He does have UDS for DOT physical yearly but was not previously on a controlled medication contract.   Depression screen [facility] 2/9 07/06/2018 12/25/2012  Decreased Interest 0 0  Down, Depressed, Hopeless 1 0  PHQ - 2 Score 1 0  Altered sleeping 0 -  Tired, decreased energy 0 -  Change in appetite 0 -  Feeling bad or failure about yourself  0 -  Trouble concentrating 0 -  Moving slowly or fidgety/restless 0 -  Suicidal thoughts 0 -  PHQ-9 Score 1 -   GAD 7 : Generalized Anxiety Score 07/06/2018  Nervous, Anxious, on Edge 0  Control/stop worrying 1  Worry too much - different things 1  Trouble relaxing 2  Restless 1  Easily annoyed or irritable 0  Afraid - awful might happen 0  Total GAD 7 Score 5    ROS:  A comprehensive ROS was completed and negative except as noted per HPI  Allergies  Allergen Reactions  . Codeine Itching  . Penicillins     Unknown reaction    Past Medical History:  Diagnosis Date  . Allergic rhinitis, cause unspecified   . Anxiety state,  unspecified   . Chronic airway obstruction, not elsewhere classified   . Esophageal reflux   . Lumbago   . Other and unspecified hyperlipidemia   . Other chest pain     Past Surgical History:  Procedure Laterality Date  . APPENDECTOMY    . COLONOSCOPY  15 years ago  . NASAL TURBINATE REDUCTION  2002   Dr.Crossley  . VASECTOMY      Social History   Socioeconomic History  . Marital status: Married    Spouse name: Designer, industrial/product  . Number of children: Not on file  . Years of education: Not on file  . Highest education level: Not on file  Occupational History  . Occupation: truck Education administrator: [employer]  . Financial resource strain: Not on file  . Food insecurity:    Worry: Not on file    Inability: Not on file  . Transportation needs:    Medical: Not on file    Non-medical: Not on file  Tobacco Use  . Smoking status: Former Smoker    Packs/day: 2.00    Years: 28.00    Pack years: 56.00    Types: Cigarettes    Last attempt to quit: 04/25/2005    Years since quitting: 13.2  . Smokeless tobacco: Never Used  Substance and Sexual Activity  . Alcohol use: Yes    Alcohol/week: 12.0 standard drinks    Types: 12 Cans of beer per week    Comment: social use  . Drug use: No  . Sexual activity: Not on file  Lifestyle  . Physical activity:    Days per week: Not on file    Minutes per session: Not on file  . Stress: Not on file  Relationships  . Social connections:    Talks on phone: Not on file    Gets together: Not on file    Attends religious service: Not on file    Active member of club or organization: Not on file    Attends meetings of clubs or organizations: Not on file    Relationship status: Not on file  Other Topics Concern  . Not on file  Social History Narrative  . Not on file    Family History  Problem Relation Age of Onset  . Breast cancer Mother   . Cancer Father   . Heart attack Brother   . Emphysema Maternal Uncle   .  COPD Maternal Uncle   . Heart disease Maternal Uncle   . Colon cancer Neg Hx     Health Maintenance  Topic Date Due  . Hepatitis C Screening  [birth year]  . HIV Screening  12/09/1975  . TETANUS/TDAP  12/11/2021  . COLONOSCOPY  04/23/2023  . INFLUENZA VACCINE  Completed    ----------------------------------------------------------------------------------------------------------------------------------------------------------------------------------------------------------------- Physical Exam BP 132/76   Pulse 85   Temp 98.3 F (36.8 C) (Oral)   Ht 5\' 10"  (1.778 m)   Wt 229 lb 9.6 oz (104.1 kg)   SpO2 97%   BMI 32.94 kg/m   Physical Exam Constitutional:      Appearance: Normal appearance.  HENT:     Head: Normocephalic and atraumatic.     Right Ear: Tympanic membrane normal.     Left Ear: Tympanic membrane normal.     Nose:     Comments: L maxillary sinus tenderness     Mouth/Throat:     Mouth: Mucous membranes are moist.  Eyes:     General: No scleral icterus. Neck:     Musculoskeletal: Neck supple.  Cardiovascular:     Rate and Rhythm: Normal rate and regular rhythm.  Pulmonary:     Effort: Pulmonary effort is normal.     Breath sounds: Normal breath sounds.  Skin:    General: Skin is warm and dry.  Neurological:     General: No focal deficit present.     Mental Status: He is alert.  Psychiatric:        Mood and Affect: Mood normal.        Behavior: Behavior normal.     ------------------------------------------------------------------------------------------------------------------------------------------------------------------------------------------------------------------- Assessment and Plan  GAD (generalized anxiety disorder) -We discussed management of his anxiety with goal to reduce his use of lorazepam. -Will start lexapro daily, he may continue ativan at current dosing for now.  -Controlled medication agreement signed -UDS ordered today.   -F/u in 6-8 weeks.   Acute maxillary sinusitis -Rx for azithromycin -Continue flonase -Increase fluid intake  >25 minutes spent face to face with patient with 50% of today's visit dedicated to discussing management of anxiety as outlined above.

## 2018-07-06 NOTE — Assessment & Plan Note (Signed)
-  We discussed management of his anxiety with goal to reduce his use of lorazepam. -Will start lexapro daily, he may continue ativan at current dosing for now.  -Controlled medication agreement signed -UDS ordered today.  -F/u in 6-8 weeks.

## 2018-07-06 NOTE — Telephone Encounter (Signed)
Called both cell/home number left message to call back. The provider is not taking new patients on medications like Ativan, if he wants that from his PCP he may be better off seeing another PCP.

## 2018-07-06 NOTE — Telephone Encounter (Signed)
Called and spoke to his wife informed of PCP instructions. She verbalized understanding and did verbalize to the patient. The patient is going to call back the office to possible reschedule his NP appt with another provider. The wife did not want me to cancel appt as of yet. But as a note he does get Ativan from PCP. She did voice response wishing this had been communicated before this appointment was made.  I did inform her that this information is known regarding Dr. Nani Ravens and should have been communicated when appt was made.

## 2018-07-06 NOTE — Assessment & Plan Note (Signed)
-  Rx for azithromycin -Continue flonase -Increase fluid intake

## 2018-07-13 LAB — TOXASSURE SELECT 13 (MW), URINE

## 2018-07-20 ENCOUNTER — Telehealth: Payer: Self-pay | Admitting: Family Medicine

## 2018-07-20 ENCOUNTER — Other Ambulatory Visit: Payer: Self-pay | Admitting: Family Medicine

## 2018-07-20 MED ORDER — LORAZEPAM 1 MG PO TABS: 1.0000 mg | ORAL_TABLET | Freq: Three times a day (TID) | ORAL | 1 refills | Status: DC | PRN

## 2018-07-20 NOTE — Telephone Encounter (Signed)
Copied from [place] [id number]. Topic: General - Other >> [name] 20, 2018  8:47 AM Celene Kras A wrote: Reason for CRM: Pt left vm stating he had questions regarding medications he is taking. Please contact and advise.

## 2018-07-20 NOTE — Telephone Encounter (Signed)
Copied from [place] [id number]. Topic: General - Other >> [name] 20, 2018  8:47 AM Celene Kras A wrote: Reason for CRM: Pt left vm stating he had questions regarding medications he is taking. Please contact and advise. Called Pt LAM

## 2018-07-20 NOTE — Telephone Encounter (Signed)
Copied from [place] [id number]. Topic: General - Other >> [name] 20, 2018  8:47 AM Celene Kras A wrote: Reason for CRM: Pt left vm stating he had questions regarding medications he is taking. Please contact and advise. Pt called back. He was asking about his new medication, Lexapro and refill of Ativan. Pt stated that new Rx Lexapro was causing daytime drowsiness, so started taking is soon as he gets off work at 2-3 pm. Than starts taking Ativan 1 mg ( 3 pills) every few hours before bed , 10-11 pm . Put Pt on hold. Spoke to Dr. Zigmund Daniel, about what Pt reported and requested medication side effects, refill on Ativan and hesitancy of making Webex visit. Dr. Zigmund Daniel sent refill for Rx Ativan, requested for Pt to reduce the amount of Ativan that he is taking , not to add any OTC with medications to Rx medications. Went back to Pt on the phone, advised him of what Dr. Zigmund Daniel recommends. Also, to start reducing the Ativan, can take take Lexapro and Ativan together 30 mins before bedtime. Urged Pt to have video Webex video visit. Pt did make a F/U on/  / @ pm. Pt stated that he will have his wife set up MyChart and Webex video when its time. Pt gave his e-mail address: [email or web address]

## 2018-08-06 ENCOUNTER — Encounter: Payer: Self-pay | Admitting: Family Medicine

## 2018-08-06 ENCOUNTER — Ambulatory Visit (INDEPENDENT_AMBULATORY_CARE_PROVIDER_SITE_OTHER): Payer: BLUE CROSS/BLUE SHIELD | Admitting: Family Medicine

## 2018-08-06 DIAGNOSIS — F411 Generalized anxiety disorder: Secondary | ICD-10-CM | POA: Diagnosis not present

## 2018-08-06 NOTE — Assessment & Plan Note (Signed)
-  Side effects while taking lexapro in the morning, improved with changing to evening time.  -He will let me know if he has increased frequency of waking up at night.  -Continue current medications for now.

## 2018-08-06 NOTE — Progress Notes (Signed)
Richard Li - 58 y.o. male MRN [id number]  Date of birth: 1960/12/23   This visit type was conducted due to national recommendations for restrictions regarding the COVID-19 Pandemic (e.g. social distancing).  This format is felt to be most appropriate for this patient at this time.  All issues noted in this document were discussed and addressed.  No physical exam was performed (except for noted visual exam findings with Video Visits).  I discussed the limitations of evaluation and management by telemedicine and the availability of in person appointments. The patient expressed understanding and agreed to proceed.  I connected with@ on 08/06/18 at  3:30 PM EDT by a video enabled telemedicine application and verified that I am speaking with the correct person using two identifiers.   Patient Location[place] [address]   Provider location:   [facility]  Chief Complaint  Patient presents with  . Follow-up    Meds F/U     HPI  Richard Li is a 58 y.o. male who presents via audio/video conferencing for a telehealth visit today.  He is following up on anxiety today.  He reports that overall he is doing well.  He was started on lexapro at previous appt.  He started taking this in the morning and reports that it made him drowsy.  He has since started taking at night which seems to be working for him.  He has had a couple of instances where he has woken up at 2-3 am.  He is unsure if the medication may be contributing.  He is continuing to take lorazepam up to tid as needed but is working on reducing need for this.  He denies oversedation with lorazepam.    ROS:  A comprehensive ROS was completed and negative except as noted per HPI  Past Medical History:  Diagnosis Date  . Allergic rhinitis, cause unspecified   . Anxiety state, unspecified   . Chronic airway obstruction, not elsewhere classified   . Esophageal reflux   . Lumbago   . Other  and unspecified hyperlipidemia   . Other chest pain     Past Surgical History:  Procedure Laterality Date  . APPENDECTOMY    . COLONOSCOPY  15 years ago  . NASAL TURBINATE REDUCTION  2002   Dr.Crossley  . VASECTOMY      Family History  Problem Relation Age of Onset  . Breast cancer Mother   . Cancer Father   . Heart attack Brother   . Emphysema Maternal Uncle   . COPD Maternal Uncle   . Heart disease Maternal Uncle   . Colon cancer Neg Hx     Social History   Socioeconomic History  . Marital status: Married    Spouse name: Designer, industrial/product  . Number of children: Not on file  . Years of education: Not on file  . Highest education level: Not on file  Occupational History  . Occupation: truck Education administrator: [employer]  . Financial resource strain: Not on file  . Food insecurity:    Worry: Not on file    Inability: Not on file  . Transportation needs:    Medical: Not on file    Non-medical: Not on file  Tobacco Use  . Smoking status: Former Smoker    Packs/day: 2.00    Years: 28.00    Pack years: 56.00    Types: Cigarettes    Last attempt  to quit: 04/25/2005    Years since quitting: 13.2  . Smokeless tobacco: Never Used  Substance and Sexual Activity  . Alcohol use: Yes    Alcohol/week: 12.0 standard drinks    Types: 12 Cans of beer per week    Comment: social use  . Drug use: No  . Sexual activity: Not on file  Lifestyle  . Physical activity:    Days per week: Not on file    Minutes per session: Not on file  . Stress: Not on file  Relationships  . Social connections:    Talks on phone: Not on file    Gets together: Not on file    Attends religious service: Not on file    Active member of club or organization: Not on file    Attends meetings of clubs or organizations: Not on file    Relationship status: Not on file  . Intimate partner violence:    Fear of current or ex partner: Not on file    Emotionally abused: Not on file     Physically abused: Not on file    Forced sexual activity: Not on file  Other Topics Concern  . Not on file  Social History Narrative  . Not on file     Current Outpatient Medications:  .  albuterol (PROAIR HFA) 108 (90 Base) MCG/ACT inhaler, Inhale 1-2 puffs into the lungs every 6 (six) hours as needed for wheezing or shortness of breath., Disp: 1 Inhaler, Rfl: 10 .  escitalopram (LEXAPRO) 10 MG tablet, Take 1/2 tab for 1 week then start 1 tab daily., Disp: 30 tablet, Rfl: 3 .  fenofibrate 160 MG tablet, Take 1 tablet (160 mg total) by mouth daily., Disp: 90 tablet, Rfl: 3 .  guaiFENesin (MUCINEX) 600 MG 12 hr tablet, Take 600 mg by mouth daily. 1 tab daily, Disp: , Rfl:  .  ibuprofen (ADVIL,MOTRIN) 200 MG tablet, Take 600 mg by mouth daily. As needed, Disp: , Rfl:  .  Ibuprofen-Famotidine (DUEXIS) 800-26.6 MG TABS, Duexis 800 mg-26.6 mg tablet  TAKE ONE TABLET BY MOUTH THREE TIMES DAILY, Disp: , Rfl:  .  LORazepam (ATIVAN) 1 MG tablet, Take 1 tablet (1 mg total) by mouth 3 (three) times daily as needed. for anxiety, Disp: 90 tablet, Rfl: 1 .  losartan (COZAAR) 100 MG tablet, Take 1 tablet (100 mg total) by mouth daily., Disp: 90 tablet, Rfl: 3 .  meclizine (ANTIVERT) 25 MG tablet, TAKE 1 TABLET BY MOUTH EVERY 6 HOURS AS NEEDED FOR DIZZINESS, Disp: 100 tablet, Rfl: 5 .  predniSONE (DELTASONE) 20 MG tablet, Take as directed., Disp: 30 tablet, Rfl: 0 .  valACYclovir (VALTREX) 1000 MG tablet, TAKE 2 TABLETS BY MOUTH IN THE MORNING AND TAKE 2 TABLETS IN THE EVENING AS NEEDED, Disp: , Rfl:   EXAM:  VITALS per patient if applicable: Temp 96.7 F (37.3 C) Comment: pt obtained on ph  Wt 230 lb (104.3 kg) Comment: pt reported from [facility]  BMI 33.00 kg/m   GENERAL: alert, oriented, appears well and in no acute distress  HEENT: atraumatic, conjunttiva clear  NECK: normal movements of the head and neck  LUNGS: on inspection no signs of respiratory distress, breathing rate appears normal, no  obvious gross SOB CV: no obvious cyanosis  MS: moves all visible extremities without noticeable abnormality  PSYCH/NEURO: pleasant and cooperative, no obvious depression or anxiety, speech and thought processing grossly intact  ASSESSMENT AND PLAN:  Discussed the following assessment and plan:  GAD (generalized anxiety disorder) -Side effects while taking lexapro in the morning, improved with changing to evening time.  -He will let me know if he has increased frequency of waking up at night.  -Continue current medications for now.        I discussed the assessment and treatment plan with the patient. The patient was provided an opportunity to ask questions and all were answered. The patient agreed with the plan and demonstrated an understanding of the instructions.   The patient was advised to call back or seek an in-person evaluation if the symptoms worsen or if the condition fails to improve as anticipated.     [name], DO

## 2018-09-23 ENCOUNTER — Other Ambulatory Visit: Payer: Self-pay | Admitting: Family Medicine

## 2018-09-23 DIAGNOSIS — F411 Generalized anxiety disorder: Secondary | ICD-10-CM

## 2018-10-24 ENCOUNTER — Other Ambulatory Visit: Payer: Self-pay | Admitting: Family Medicine

## 2018-10-24 DIAGNOSIS — F411 Generalized anxiety disorder: Secondary | ICD-10-CM

## 2018-11-21 ENCOUNTER — Other Ambulatory Visit: Payer: Self-pay | Admitting: Family Medicine

## 2018-11-21 DIAGNOSIS — F411 Generalized anxiety disorder: Secondary | ICD-10-CM

## 2018-11-22 NOTE — Telephone Encounter (Signed)
Last OV: 07/2018 Last refill: 10/24/2018 No future appts scheduled.

## 2018-12-21 ENCOUNTER — Other Ambulatory Visit: Payer: Self-pay | Admitting: Family Medicine

## 2018-12-21 DIAGNOSIS — F411 Generalized anxiety disorder: Secondary | ICD-10-CM

## 2018-12-24 NOTE — Telephone Encounter (Signed)
Dr. Zigmund Daniel please advise last refill 7/30 #90 no refills last OV 4/13, upcoming appt 11/06

## 2018-12-24 NOTE — Telephone Encounter (Signed)
Patient is calling to check on the status of this medication.  Advised medication refills can take up to three business days.

## 2019-01-01 DIAGNOSIS — D1801 Hemangioma of skin and subcutaneous tissue: Secondary | ICD-10-CM | POA: Diagnosis not present

## 2019-01-21 ENCOUNTER — Other Ambulatory Visit: Payer: Self-pay | Admitting: Family Medicine

## 2019-01-21 DIAGNOSIS — F411 Generalized anxiety disorder: Secondary | ICD-10-CM

## 2019-01-30 DIAGNOSIS — M25562 Pain in left knee: Secondary | ICD-10-CM | POA: Diagnosis not present

## 2019-02-20 ENCOUNTER — Other Ambulatory Visit: Payer: Self-pay | Admitting: Family Medicine

## 2019-02-20 ENCOUNTER — Encounter: Payer: Self-pay | Admitting: Family Medicine

## 2019-02-20 DIAGNOSIS — F411 Generalized anxiety disorder: Secondary | ICD-10-CM

## 2019-02-20 MED ORDER — LORAZEPAM 1 MG PO TABS: 1.0000 mg | ORAL_TABLET | Freq: Three times a day (TID) | ORAL | 0 refills | Status: DC | PRN

## 2019-02-20 NOTE — Progress Notes (Signed)
Received refill request for lorazepam.  He is due for f/u and it appears he is not taking lexapro that was prescribed previously for his anxiety as it was last sent in 3/13 with 3 refills.

## 2019-02-20 NOTE — Progress Notes (Signed)
Refill sent.

## 2019-02-20 NOTE — Progress Notes (Signed)
Patient is scheduled for follow up appt 11/6. Patient said he only have 3 tabs left of lorazepam and if he can have a few to last him until next week it's hard for him to sleep without them.

## 2019-02-28 ENCOUNTER — Telehealth: Payer: Self-pay

## 2019-02-28 NOTE — Telephone Encounter (Signed)
Questions for Screening COVID-19    Travel or Contacts: No  During this illness, did/does the patient experience any of the following symptoms? Fever >100.3F []   Yes [x]   No []   Unknown Subjective fever (felt feverish) []   Yes [x]   No []   Unknown Chills []   Yes [x]   No []   Unknown Muscle aches (myalgia) []   Yes [x]   No []   Unknown Runny nose (rhinorrhea) []   Yes [x]   No []   Unknown Sore throat []   Yes [x]   No []   Unknown Cough (new onset or worsening of chronic cough) []   Yes [x]   No []   Unknown Shortness of breath (dyspnea) []   Yes [x]   No []   Unknown Nausea or vomiting []   Yes [x]   No []   Unknown Headache []   Yes [x]   No []   Unknown Abdominal pain  []   Yes [x]   No []   Unknown Diarrhea (?3 loose/looser than normal stools/24hr period) []   Yes [x]   No []   Unknown

## 2019-03-01 ENCOUNTER — Encounter: Payer: Self-pay | Admitting: Family Medicine

## 2019-03-01 ENCOUNTER — Other Ambulatory Visit: Payer: Self-pay

## 2019-03-01 ENCOUNTER — Ambulatory Visit (INDEPENDENT_AMBULATORY_CARE_PROVIDER_SITE_OTHER): Payer: BC Managed Care – PPO | Admitting: Family Medicine

## 2019-03-01 VITALS — BP 138/78 | HR 75 | Temp 98.6°F | Ht 70.0 in | Wt 239.0 lb

## 2019-03-01 DIAGNOSIS — E785 Hyperlipidemia, unspecified: Secondary | ICD-10-CM | POA: Diagnosis not present

## 2019-03-01 DIAGNOSIS — Z13220 Encounter for screening for lipoid disorders: Secondary | ICD-10-CM | POA: Diagnosis not present

## 2019-03-01 DIAGNOSIS — Z0000 Encounter for general adult medical examination without abnormal findings: Secondary | ICD-10-CM

## 2019-03-01 DIAGNOSIS — I10 Essential (primary) hypertension: Secondary | ICD-10-CM

## 2019-03-01 DIAGNOSIS — H40009 Preglaucoma, unspecified, unspecified eye: Secondary | ICD-10-CM | POA: Diagnosis not present

## 2019-03-01 DIAGNOSIS — Z125 Encounter for screening for malignant neoplasm of prostate: Secondary | ICD-10-CM

## 2019-03-01 DIAGNOSIS — Z23 Encounter for immunization: Secondary | ICD-10-CM

## 2019-03-01 DIAGNOSIS — F411 Generalized anxiety disorder: Secondary | ICD-10-CM

## 2019-03-01 LAB — COMPREHENSIVE METABOLIC PANEL
ALT: 18 U/L (ref 0–53)
AST: 20 U/L (ref 0–37)
Albumin: 4.5 g/dL (ref 3.5–5.2)
Alkaline Phosphatase: 58 U/L (ref 39–117)
BUN: 13 mg/dL (ref 6–23)
CO2: 27 mEq/L (ref 19–32)
Calcium: 9.1 mg/dL (ref 8.4–10.5)
Chloride: 100 mEq/L (ref 96–112)
Creatinine, Ser: 1.09 mg/dL (ref 0.40–1.50)
GFR: 69.43 mL/min (ref >60.00)
Glucose, Bld: 96 mg/dL (ref 70–99)
Potassium: 4.3 mEq/L (ref 3.5–5.1)
Sodium: 134 mEq/L — ABNORMAL LOW (ref 135–145)
Total Bilirubin: 0.8 mg/dL (ref 0.2–1.2)
Total Protein: 7 g/dL (ref 6.0–8.3)

## 2019-03-01 LAB — CBC
HCT: 43.8 % (ref 39.0–52.0)
Hemoglobin: 15.4 g/dL (ref 13.0–17.0)
MCHC: 35.2 g/dL (ref 30.0–36.0)
MCV: 92.2 fl (ref 78.0–100.0)
Platelets: 244 10*3/uL (ref 150.0–400.0)
RBC: 4.76 Mil/uL (ref 4.22–5.81)
RDW: 13 % (ref 11.5–15.5)
WBC: 6.8 10*3/uL (ref 4.0–10.5)

## 2019-03-01 LAB — TSH: TSH: 2.77 u[IU]/mL (ref 0.35–4.50)

## 2019-03-01 LAB — LIPID PANEL
Cholesterol: 181 mg/dL (ref 0–200)
HDL: 37 mg/dL — ABNORMAL LOW (ref >39.00)
LDL Cholesterol: 116 mg/dL — ABNORMAL HIGH (ref 0–99)
NonHDL: 143.78
Total CHOL/HDL Ratio: 5
Triglycerides: 139 mg/dL (ref 0.0–149.0)
VLDL: 27.8 mg/dL (ref 0.0–40.0)

## 2019-03-01 LAB — PSA: PSA: 0.5 ng/mL (ref 0.10–4.00)

## 2019-03-01 MED ORDER — VENLAFAXINE HCL ER 75 MG PO CP24: 75.0000 mg | ORAL_CAPSULE | Freq: Every day | ORAL | 3 refills | Status: DC

## 2019-03-01 NOTE — Assessment & Plan Note (Signed)
BP remains well controlled with current dose of losartan, continue

## 2019-03-01 NOTE — Progress Notes (Signed)
Richard Li - 58 y.o. male MRN [id number]  Date of birth: 09-17-1960  Subjective Chief Complaint  Patient presents with  . Annual Exam    cpe X fasting.    HPI Richard Li is a 58 y.o. male wit history of HTN, HLD and GAD here today for annual exam.  He reports that overall he is doing well.  Still has issues with anxiety and sleep related to this.  Current dose of lorazepam has worked well for him.  He did try lexapro however felt sluggish while taking this and felt more tired while driving.  He is open to trying something different.  He is compliant with BP medicaiton and fenofibrate.  His weight is up some since last visit, blames this on not being as active during pandemic.   BP has remained well controlled.  He is a non-smoker and consumes 10-12 beers in a week.    Review of Systems  Constitutional: Negative for chills, fever, malaise/fatigue and weight loss.  HENT: Negative for congestion, ear pain and sore throat.   Eyes: Negative for blurred vision, double vision and pain.  Respiratory: Negative for cough and shortness of breath.   Cardiovascular: Negative for chest pain and palpitations.  Gastrointestinal: Negative for abdominal pain, blood in stool, constipation, heartburn and nausea.  Genitourinary: Negative for dysuria and urgency.  Musculoskeletal: Negative for joint pain and myalgias.  Neurological: Negative for dizziness and headaches.  Endo/Heme/Allergies: Does not bruise/bleed easily.  Psychiatric/Behavioral: Negative for depression. The patient is nervous/anxious and has insomnia.     Allergies  Allergen Reactions  . Codeine Itching  . Penicillins     Unknown reaction    Past Medical History:  Diagnosis Date  . Allergic rhinitis, cause unspecified   . Anxiety state, unspecified   . Chronic airway obstruction, not elsewhere classified   . Esophageal reflux   . Lumbago   . Other and unspecified hyperlipidemia   . Other chest pain     Past Surgical  History:  Procedure Laterality Date  . APPENDECTOMY    . COLONOSCOPY  15 years ago  . NASAL TURBINATE REDUCTION  2002   Dr.Crossley  . VASECTOMY      Social History   Socioeconomic History  . Marital status: Married    Spouse name: Designer, industrial/product  . Number of children: Not on file  . Years of education: Not on file  . Highest education level: Not on file  Occupational History  . 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 Financial resource strain: Not on file  . Food insecurity    Worry: Not on file    Inability: Not on file  . Transportation needs    Medical: Not on file    Non-medical: Not on file  Tobacco Use  . Smoking status: Former Smoker    Packs/day: 2.00    Years: 28.00    Pack years: 56.00    Types: Cigarettes    Quit date: 04/25/2005    Years since quitting: 13.8  . Smokeless tobacco: Never Used  Substance and Sexual Activity  . Alcohol use: Yes    Alcohol/week: 12.0 standard drinks    Types: 12 Cans of beer per week    Comment: social use  . Drug use: No  . Sexual activity: Not on file  Lifestyle  . Physical activity    Days per week: Not on file    Minutes per session: Not on  file  . Stress: Not on file  Relationships  . Social Herbalist on phone: Not on file    Gets together: Not on file    Attends religious service: Not on file    Active member of club or organization: Not on file    Attends meetings of clubs or organizations: Not on file    Relationship status: Not on file  Other Topics Concern  . Not on file  Social History Narrative  . Not on file    Family History  Problem Relation Age of Onset  . Breast cancer Mother   . Cancer Father   . Heart attack Brother   . Emphysema Maternal Uncle   . COPD Maternal Uncle   . Heart disease Maternal Uncle   . Colon cancer Neg Hx     Health Maintenance  Topic Date Due  . Hepatitis C Screening  [birth year]  . HIV Screening  12/09/1975  . INFLUENZA  VACCINE  11/24/2018  . TETANUS/TDAP  12/11/2021  . COLONOSCOPY  04/23/2023    ----------------------------------------------------------------------------------------------------------------------------------------------------------------------------------------------------------------- Physical Exam BP 138/78   Pulse 75   Temp 98.6 F (37 C) (Temporal)   Ht 5' 10"  (1.778 m)   Wt 239 lb (108.4 kg)   SpO2 99%   BMI 34.29 kg/m   Physical Exam Constitutional:      General: He is not in acute distress. HENT:     Head: Normocephalic and atraumatic.     Right Ear: External ear normal.     Left Ear: External ear normal.     Mouth/Throat:     Mouth: Mucous membranes are moist.  Eyes:     General: No scleral icterus. Neck:     Musculoskeletal: Normal range of motion.     Thyroid: No thyromegaly.  Cardiovascular:     Rate and Rhythm: Normal rate and regular rhythm.     Heart sounds: Normal heart sounds.  Pulmonary:     Effort: Pulmonary effort is normal.     Breath sounds: Normal breath sounds.  Abdominal:     General: Bowel sounds are normal. There is no distension.     Palpations: Abdomen is soft.     Tenderness: There is no abdominal tenderness. There is no guarding.  Musculoskeletal: Normal range of motion.     Right lower leg: No edema.     Left lower leg: No edema.  Lymphadenopathy:     Cervical: No cervical adenopathy.  Skin:    General: Skin is warm and dry.     Findings: No rash.  Neurological:     General: No focal deficit present.     Mental Status: He is alert and oriented to person, place, and time.     Cranial Nerves: No cranial nerve deficit.     Motor: No abnormal muscle tone.  Psychiatric:        Mood and Affect: Mood normal.        Behavior: Behavior normal.      ------------------------------------------------------------------------------------------------------------------------------------------------------------------------------------------------------------------- Assessment and Plan  Essential hypertension BP remains well controlled with current dose of losartan, continue  GAD (generalized anxiety disorder) Discussed with him goal of reducing his daily lorazepam usage.  He is open to trying something in addition for better baseline anxiety control.  Lexapro seemed too sedating for him, will provide trial of Effexor and plan to f/u in about 8 weeks.  He may continue lorazepam as prescribed for the time being.   Physical exam, annual Well  adult Orders Placed This Encounter  Procedures  . Flu Vaccine QUAD 6+ mos PF IM (Fluarix Quad PF)  . Comp Met (CMET)  . CBC  . Lipid panel  . TSH  . PSA  Screening: PSA Immunizations:  Influenza Anticipatory guidance/Risk factor reduction:  Counseled on healthy diet and regular exercise.  Additional recommendations per AVS.     Return in about 2 months (around 05/01/2019) for F/u for Anxiety/New med.

## 2019-03-01 NOTE — Assessment & Plan Note (Signed)
Well adult Orders Placed This Encounter  Procedures  . Flu Vaccine QUAD 6+ mos PF IM (Fluarix Quad PF)  . Comp Met (CMET)  . CBC  . Lipid panel  . TSH  . PSA  Screening: PSA Immunizations:  Influenza Anticipatory guidance/Risk factor reduction:  Counseled on healthy diet and regular exercise.  Additional recommendations per AVS.

## 2019-03-01 NOTE — Assessment & Plan Note (Signed)
Discussed with him goal of reducing his daily lorazepam usage.  He is open to trying something in addition for better baseline anxiety control.  Lexapro seemed too sedating for him, will provide trial of Effexor and plan to f/u in about 8 weeks.  He may continue lorazepam as prescribed for the time being.

## 2019-03-07 NOTE — Progress Notes (Signed)
Please inform patient that labs are normal.  Continue current medications.   Thanks!

## 2019-03-19 ENCOUNTER — Other Ambulatory Visit: Payer: Self-pay | Admitting: Family Medicine

## 2019-03-19 DIAGNOSIS — F411 Generalized anxiety disorder: Secondary | ICD-10-CM

## 2019-03-26 ENCOUNTER — Other Ambulatory Visit: Payer: Self-pay

## 2019-03-26 ENCOUNTER — Encounter: Payer: Self-pay | Admitting: Family Medicine

## 2019-03-26 ENCOUNTER — Ambulatory Visit (INDEPENDENT_AMBULATORY_CARE_PROVIDER_SITE_OTHER): Payer: BC Managed Care – PPO | Admitting: Family Medicine

## 2019-03-26 DIAGNOSIS — J0100 Acute maxillary sinusitis, unspecified: Secondary | ICD-10-CM

## 2019-03-26 MED ORDER — CEFDINIR 300 MG PO CAPS: 300.0000 mg | ORAL_CAPSULE | Freq: Two times a day (BID) | ORAL | 0 refills | Status: DC

## 2019-03-26 NOTE — Progress Notes (Signed)
Richard Li - 58 y.o. male MRN [id number]  Date of birth: July 16, 1960   This visit type was conducted due to national recommendations for restrictions regarding the COVID-19 Pandemic (e.g. social distancing).  This format is felt to be most appropriate for this patient at this time.  All issues noted in this document were discussed and addressed.  No physical exam was performed (except for noted visual exam findings with Video Visits).  I discussed the limitations of evaluation and management by telemedicine and the availability of in person appointments. The patient expressed understanding and agreed to proceed.  I connected with@ on 03/26/19 at  2:40 PM EST by a video enabled telemedicine application and verified that I am speaking with the correct person using two identifiers.  Present at visit: [name], DO [name]   Patient Location: Home [address] [place] [address]   Provider location:  [facility]  Chief Complaint  Patient presents with  . Nasal Congestion    Sx started on Friday  . sinus pressure    with pain  . Cough    coughing up lime greenish color sputum  . denies    fever,aches    HPI  [name] is a 58 y.o. male who presents via audio/video conferencing for a telehealth visit today.  He has complaint of congestion, sinus pain, and productive cough with thick green/yellow sputum.  He reports symptom onset was around 5 days ago.  He has pain and tenderness in his maxillary sinuses bilaterally.  He denies fever, chills, body aches, headaches, sore throat, chest tightness, wheezing or shortness of breath.  He is taking mucinex with mild improvement in symptoms.    ROS:  A comprehensive ROS was completed and negative except as noted per HPI  Past Medical History:  Diagnosis Date  . Allergic rhinitis, cause unspecified   . Anxiety state, unspecified   . Chronic airway obstruction, not elsewhere classified   .  Esophageal reflux   . Lumbago   . Other and unspecified hyperlipidemia   . Other chest pain     Past Surgical History:  Procedure Laterality Date  . APPENDECTOMY    . COLONOSCOPY  15 years ago  . NASAL TURBINATE REDUCTION  2002   Dr.Crossley  . VASECTOMY      Family History  Problem Relation Age of Onset  . Breast cancer Mother   . Cancer Father   . Heart attack Brother   . Emphysema Maternal Uncle   . COPD Maternal Uncle   . Heart disease Maternal Uncle   . Colon cancer Neg Hx     Social History   Socioeconomic History  . Marital status: Married    Spouse name: Designer, industrial/product  . Number of children: Not on file  . Years of education: Not on file  . Highest education level: Not on file  Occupational History  . 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 Financial resource strain: Not on file  . Food insecurity    Worry: Not on file    Inability: Not on file  . Transportation needs    Medical: Not on file    Non-medical: Not on file  Tobacco Use  . Smoking status: Former Smoker    Packs/day: 2.00    Years: 28.00    Pack years: 56.00    Types: Cigarettes    Quit date: 04/25/2005    Years since  quitting: 13.9  . Smokeless tobacco: Never Used  Substance and Sexual Activity  . Alcohol use: Yes    Alcohol/week: 12.0 standard drinks    Types: 12 Cans of beer per week    Comment: social use  . Drug use: No  . Sexual activity: Not on file  Lifestyle  . Physical activity    Days per week: Not on file    Minutes per session: Not on file  . Stress: Not on file  Relationships  . Social Herbalist on phone: Not on file    Gets together: Not on file    Attends religious service: Not on file    Active member of club or organization: Not on file    Attends meetings of clubs or organizations: Not on file    Relationship status: Not on file  . Intimate partner violence    Fear of current or ex partner: Not on file    Emotionally  abused: Not on file    Physically abused: Not on file    Forced sexual activity: Not on file  Other Topics Concern  . Not on file  Social History Narrative  . Not on file     Current Outpatient Medications:  .  albuterol (PROAIR HFA) 108 (90 Base) MCG/ACT inhaler, Inhale 1-2 puffs into the lungs every 6 (six) hours as needed for wheezing or shortness of breath., Disp: 1 Inhaler, Rfl: 10 .  escitalopram (LEXAPRO) 10 MG tablet, Take 1/2 tab for 1 week then start 1 tab daily., Disp: 30 tablet, Rfl: 3 .  fenofibrate 160 MG tablet, Take 1 tablet (160 mg total) by mouth daily., Disp: 90 tablet, Rfl: 3 .  guaiFENesin (MUCINEX) 600 MG 12 hr tablet, Take 600 mg by mouth daily. 1 tab daily, Disp: , Rfl:  .  ibuprofen (ADVIL,MOTRIN) 200 MG tablet, Take 600 mg by mouth daily. As needed, Disp: , Rfl:  .  LORazepam (ATIVAN) 1 MG tablet, TAKE 1 TABLET BY MOUTH THREE TIMES DAILY AS NEEDED FOR ANXIETY, Disp: 90 tablet, Rfl: 0 .  losartan (COZAAR) 100 MG tablet, Take 1 tablet (100 mg total) by mouth daily., Disp: 90 tablet, Rfl: 3 .  meclizine (ANTIVERT) 25 MG tablet, TAKE 1 TABLET BY MOUTH EVERY 6 HOURS AS NEEDED FOR DIZZINESS, Disp: 100 tablet, Rfl: 5 .  valACYclovir (VALTREX) 1000 MG tablet, TAKE 2 TABLETS BY MOUTH IN THE MORNING AND TAKE 2 TABLETS IN THE EVENING AS NEEDED, Disp: , Rfl:  .  venlafaxine XR (EFFEXOR XR) 75 MG 24 hr capsule, Take 1 capsule (75 mg total) by mouth daily with breakfast., Disp: 30 capsule, Rfl: 3 .  cefdinir (OMNICEF) 300 MG capsule, Take 1 capsule (300 mg total) by mouth 2 (two) times daily for 10 days., Disp: 20 capsule, Rfl: 0 .  Ibuprofen-Famotidine (DUEXIS) 800-26.6 MG TABS, Duexis 800 mg-26.6 mg tablet  TAKE ONE TABLET BY MOUTH THREE TIMES DAILY, Disp: , Rfl:   EXAM:  VITALS per patient if applicable: Ht 5\' 10"  (1.778 m)   Wt 245 lb (111.1 kg)   BMI 35.15 kg/m   GENERAL: alert, oriented, appears well and in no acute distress  HEENT: atraumatic, conjunttiva clear, no  obvious abnormalities on inspection of external nose and ears  NECK: normal movements of the head and neck  LUNGS: on inspection no signs of respiratory distress, breathing rate appears normal, no obvious gross SOB, gasping or wheezing  CV: no obvious cyanosis  MS: moves all  visible extremities without noticeable abnormality  PSYCH/NEURO: pleasant and cooperative, no obvious depression or anxiety, speech and thought processing grossly intact  ASSESSMENT AND PLAN:  Discussed the following assessment and plan:  Acute maxillary sinusitis Treat with course of cefdinir.  PCN allergy listed but reports rash only from this.   Increase fluids.  He may continue mucinex.  Recommend that he contact our clinic for any new or worsening symptoms.      I discussed the assessment and treatment plan with the patient. The patient was provided an opportunity to ask questions and all were answered. The patient agreed with the plan and demonstrated an understanding of the instructions.   The patient was advised to call back or seek an in-person evaluation if the symptoms worsen or if the condition fails to improve as anticipated.    [name], DO

## 2019-03-26 NOTE — Assessment & Plan Note (Signed)
Treat with course of cefdinir.  PCN allergy listed but reports rash only from this.   Increase fluids.  He may continue mucinex.  Recommend that he contact our clinic for any new or worsening symptoms.

## 2019-04-15 DIAGNOSIS — D1801 Hemangioma of skin and subcutaneous tissue: Secondary | ICD-10-CM | POA: Diagnosis not present

## 2019-04-17 ENCOUNTER — Other Ambulatory Visit: Payer: Self-pay | Admitting: Family Medicine

## 2019-04-17 MED ORDER — FENOFIBRATE 160 MG PO TABS: 160.0000 mg | ORAL_TABLET | Freq: Every day | ORAL | 3 refills | Status: DC

## 2019-04-17 MED ORDER — LOSARTAN POTASSIUM 100 MG PO TABS: 100.0000 mg | ORAL_TABLET | Freq: Every day | ORAL | 3 refills | Status: DC

## 2019-04-22 ENCOUNTER — Other Ambulatory Visit: Payer: Self-pay | Admitting: Family Medicine

## 2019-04-22 DIAGNOSIS — F411 Generalized anxiety disorder: Secondary | ICD-10-CM

## 2019-04-22 NOTE — Telephone Encounter (Signed)
Dr. Zigmund Daniel please advise, last ov was 02/2019--has f/u with you on 04/2019.  Last PMP checked--3 pick up from [place] for 90 tabs were: 03/20/2019 02/20/2019 01/23/2019

## 2019-04-24 NOTE — Telephone Encounter (Signed)
pt calling to check status. Please advise

## 2019-05-03 ENCOUNTER — Ambulatory Visit: Payer: BC Managed Care – PPO | Admitting: Family Medicine

## 2019-05-09 ENCOUNTER — Other Ambulatory Visit: Payer: Self-pay

## 2019-05-10 ENCOUNTER — Ambulatory Visit: Payer: BC Managed Care – PPO | Admitting: Family Medicine

## 2019-05-10 ENCOUNTER — Encounter: Payer: Self-pay | Admitting: Family Medicine

## 2019-05-10 DIAGNOSIS — F411 Generalized anxiety disorder: Secondary | ICD-10-CM

## 2019-05-10 DIAGNOSIS — I10 Essential (primary) hypertension: Secondary | ICD-10-CM | POA: Diagnosis not present

## 2019-05-10 MED ORDER — VENLAFAXINE HCL ER 75 MG PO CP24: 75.0000 mg | ORAL_CAPSULE | Freq: Every day | ORAL | 3 refills | Status: DC

## 2019-05-10 MED ORDER — MECLIZINE HCL 25 MG PO TABS: ORAL_TABLET | ORAL | 5 refills | Status: DC

## 2019-05-10 MED ORDER — ONDANSETRON 4 MG PO TBDP: 4.0000 mg | ORAL_TABLET | Freq: Three times a day (TID) | ORAL | 0 refills | Status: DC | PRN

## 2019-05-10 NOTE — Patient Instructions (Signed)
Use voltaren gel to arm 4 times per day.  You can get this over the counter.   If you decide to see me in [place] the contact info is:   [address], [place],  [address] [phone]

## 2019-05-16 ENCOUNTER — Encounter: Payer: Self-pay | Admitting: Family Medicine

## 2019-05-16 NOTE — Assessment & Plan Note (Signed)
BP is well controlled, continue current dose of losartan

## 2019-05-16 NOTE — Assessment & Plan Note (Addendum)
Not well controlled, continues to use benzodiazepines exclusively for his anxiety control  Discussed goal is to back down on amount of lorazepam he is using.  He is willing to try effexor again.  I have sent in zofran to help with nausea associated with this.  Recommend therapist as well however he reports he doesn't have time to do this.

## 2019-05-16 NOTE — Progress Notes (Signed)
Richard Li - 59 y.o. male MRN [id number]  Date of birth: [birth year]  Subjective Chief Complaint  Patient presents with  . Anxiety    states effexor causes him to have nausea and dizziness   . Medication Refill     Lorazepam & meclizine.   . Medication Management    would like rx for combivent     HPI Richard Li is a 59 y.o. male here today for follow up of anxiety. He is currently taking lorazepam for anxiety.  Reports that he has difficulty getting things done and sleeping due to his anxiety if he is not taking this.  He has been tried on lexapro however he did not tolerate as he reported it made him too drowsy and most recently tried on effexor however stopped after 1 dose because it made him nauseous.  He does not want to try anything else stating the lorazepam works well for him.  He denies over-sedation from the lorazepam.    BP remains well controlled with losartan, he denies side effects.   ROS:  A comprehensive ROS was completed and negative except as noted per HPI  Allergies  Allergen Reactions  . Codeine Itching  . Penicillins     Unknown reaction    Past Medical History:  Diagnosis Date  . Allergic rhinitis, cause unspecified   . Anxiety state, unspecified   . Chronic airway obstruction, not elsewhere classified   . Esophageal reflux   . Lumbago   . Other and unspecified hyperlipidemia   . Other chest pain     Past Surgical History:  Procedure Laterality Date  . APPENDECTOMY    . COLONOSCOPY  15 years ago  . NASAL TURBINATE REDUCTION  2002   Dr.Crossley  . VASECTOMY      Social History   Socioeconomic History  . Marital status: Married    Spouse name: Designer, industrial/product  . Number of children: Not on file  . Years of education: Not on file  . Highest education level: Not on file  Occupational History  . Occupation: truck Education administrator: [employer]  Tobacco Use  . Smoking status: Former Smoker    Packs/day: 2.00    Years: 28.00    Pack  years: 56.00    Types: Cigarettes    Quit date: 04/25/2005    Years since quitting: 14.0  . Smokeless tobacco: Never Used  Substance and Sexual Activity  . Alcohol use: Yes    Alcohol/week: 12.0 standard drinks    Types: 12 Cans of beer per week    Comment: social use  . Drug use: No  . Sexual activity: Not on file  Other Topics Concern  . Not on file  Social History Narrative  . Not on file   Social Determinants of Health   Financial Resource Strain:   . Difficulty of Paying Living Expenses: Not on file  Food Insecurity:   . Worried About Charity fundraiser in the Last Year: Not on file  . Ran Out of Food in the Last Year: Not on file  Transportation Needs:   . Lack of Transportation (Medical): Not on file  . Lack of Transportation (Non-Medical): Not on file  Physical Activity:   . Days of Exercise per Week: Not on file  . Minutes of Exercise per Session: Not on file  Stress:   . Feeling of Stress : Not on file  Social Connections:   . Frequency of Communication with  Friends and Family: Not on file  . Frequency of Social Gatherings with Friends and Family: Not on file  . Attends Religious Services: Not on file  . Active Member of Clubs or Organizations: Not on file  . Attends Archivist Meetings: Not on file  . Marital Status: Not on file    Family History  Problem Relation Age of Onset  . Breast cancer Mother   . Cancer Father   . Heart attack Brother   . Emphysema Maternal Uncle   . COPD Maternal Uncle   . Heart disease Maternal Uncle   . Colon cancer Neg Hx     Health Maintenance  Topic Date Due  . Hepatitis C Screening  [birth year]  . HIV Screening  12/09/1975  . TETANUS/TDAP  12/11/2021  . COLONOSCOPY  04/23/2023  . INFLUENZA VACCINE  Completed     ----------------------------------------------------------------------------------------------------------------------------------------------------------------------------------------------------------------- Physical Exam BP 132/70   Pulse 76   Temp (!) 97.4 F (36.3 C)   Ht 5\' 10"  (1.778 m)   Wt 243 lb (110.2 kg)   SpO2 99%   BMI 34.87 kg/m   Physical Exam Constitutional:      Appearance: Normal appearance.  HENT:     Head: Normocephalic and atraumatic.     Mouth/Throat:     Mouth: Mucous membranes are moist.  Eyes:     General: No scleral icterus. Cardiovascular:     Rate and Rhythm: Normal rate and regular rhythm.  Pulmonary:     Effort: Pulmonary effort is normal.     Breath sounds: Normal breath sounds.  Skin:    General: Skin is warm and dry.  Neurological:     General: No focal deficit present.     Mental Status: He is alert.  Psychiatric:        Mood and Affect: Mood normal.        Behavior: Behavior normal.     ------------------------------------------------------------------------------------------------------------------------------------------------------------------------------------------------------------------- Assessment and Plan  Essential hypertension BP is well controlled, continue current dose of losartan  GAD (generalized anxiety disorder) Not well controlled, continues to use benzodiazepines exclusively for his anxiety control  Discussed goal is to back down on amount of lorazepam he is using.  He is willing to try effexor again.  I have sent in zofran to help with nausea associated with this.  Recommend therapist as well however he reports he doesn't have time to do this.     This visit occurred during the SARS-CoV-2 public health emergency.  Safety protocols were in place, including screening questions prior to the visit, additional usage of staff PPE, and extensive cleaning of exam room while observing appropriate contact time as  indicated for disinfecting solutions.

## 2019-05-20 ENCOUNTER — Other Ambulatory Visit: Payer: Self-pay | Admitting: Family Medicine

## 2019-05-20 DIAGNOSIS — F411 Generalized anxiety disorder: Secondary | ICD-10-CM

## 2019-06-21 ENCOUNTER — Other Ambulatory Visit: Payer: Self-pay | Admitting: Family Medicine

## 2019-06-21 DIAGNOSIS — M542 Cervicalgia: Secondary | ICD-10-CM | POA: Diagnosis not present

## 2019-06-21 DIAGNOSIS — M25511 Pain in right shoulder: Secondary | ICD-10-CM | POA: Diagnosis not present

## 2019-06-21 DIAGNOSIS — F411 Generalized anxiety disorder: Secondary | ICD-10-CM

## 2019-06-21 NOTE — Telephone Encounter (Signed)
Spoke with patient who states that he will be following dr. Zigmund Daniel at his new location. Patient aware that he will need to contact that office for his refill on medications.

## 2019-06-21 NOTE — Telephone Encounter (Signed)
Patient called and wants to know if you could send in a refill of his medication. It has been pended for refill to [facility]. Please advise.

## 2019-06-24 NOTE — Telephone Encounter (Signed)
Requesting: Lorazepam Contract: Yes UDS: Yes Last OV: 1.15.2021 Next OV: Not scheduled/noted on Rx to pharm that pt needs visit for future fills Last Refill: 2.1.2021   Please advise

## 2019-06-26 ENCOUNTER — Telehealth: Payer: Self-pay

## 2019-06-26 NOTE — Telephone Encounter (Addendum)
Rx appears to have been refilled by Dr. Zigmund Daniel on 3/1. please have pt schedule f/u appt in next 1 mo if he is going to establish with myself or provider in this office

## 2019-06-26 NOTE — Telephone Encounter (Signed)
Can you please see if pt either wants to follow Dr. Zigmund Daniel to new location or if wants to schedule with Dr. Loletha Grayer? She sent in a refill for a medication and stated that he needed an appointment with her if she is to continue filling/plz advise/thx dmf

## 2019-06-26 NOTE — Telephone Encounter (Signed)
Pt going to follow Dr Zigmund Daniel

## 2019-07-23 ENCOUNTER — Other Ambulatory Visit: Payer: Self-pay | Admitting: Family Medicine

## 2019-07-23 DIAGNOSIS — F411 Generalized anxiety disorder: Secondary | ICD-10-CM

## 2019-07-26 DIAGNOSIS — M25511 Pain in right shoulder: Secondary | ICD-10-CM | POA: Diagnosis not present

## 2019-07-29 ENCOUNTER — Telehealth: Payer: Self-pay

## 2019-07-29 NOTE — Telephone Encounter (Signed)
Patient has been cancelled for the appointment with Jacolyn Reedy. I advised him of office policy and he does not want to wait until next week. He reports that "he will figure something out". No other concerns.

## 2019-07-29 NOTE — Telephone Encounter (Signed)
Richard Li,   I see where you scheduled this patient tomorrow with Emeterio Reeve. This patient is not established with our office (never been seen here) and needs to establish with Dr Hilma Favors. That being said, since he wants controlled meds, he cant establish with Emeterio Reeve or Joy just for that. He needs to be established with only Zigmund Daniel. Please call patient and schedule him with Zigmund Daniel next week, and he needs to contact the covering provider at Dr Zigmund Daniel' old office to get a refill on Lorazepam. Discussed with Dr Darene Lamer., covering provider, and he is agreeable to this plan. No controlled substances will be filled without him seeing his PCP and he cannot establishg care with a NP here just for that refill, and he can also not be seen here without that "new patient" appointment.  Thanks!

## 2019-07-30 ENCOUNTER — Telehealth: Payer: BC Managed Care – PPO | Admitting: Nurse Practitioner

## 2019-07-30 DIAGNOSIS — I10 Essential (primary) hypertension: Secondary | ICD-10-CM | POA: Diagnosis not present

## 2019-07-30 DIAGNOSIS — J440 Chronic obstructive pulmonary disease with acute lower respiratory infection: Secondary | ICD-10-CM | POA: Diagnosis not present

## 2019-07-31 DIAGNOSIS — M25511 Pain in right shoulder: Secondary | ICD-10-CM | POA: Diagnosis not present

## 2019-09-04 DIAGNOSIS — M25511 Pain in right shoulder: Secondary | ICD-10-CM | POA: Diagnosis not present

## 2019-09-04 DIAGNOSIS — M542 Cervicalgia: Secondary | ICD-10-CM | POA: Diagnosis not present

## 2019-09-10 ENCOUNTER — Encounter: Payer: Self-pay | Admitting: Family Medicine

## 2019-09-10 ENCOUNTER — Ambulatory Visit: Payer: BC Managed Care – PPO | Admitting: Family Medicine

## 2019-09-10 ENCOUNTER — Ambulatory Visit (INDEPENDENT_AMBULATORY_CARE_PROVIDER_SITE_OTHER): Payer: BC Managed Care – PPO | Admitting: Family Medicine

## 2019-09-10 ENCOUNTER — Other Ambulatory Visit: Payer: Self-pay

## 2019-09-10 DIAGNOSIS — E785 Hyperlipidemia, unspecified: Secondary | ICD-10-CM | POA: Diagnosis not present

## 2019-09-10 DIAGNOSIS — F5101 Primary insomnia: Secondary | ICD-10-CM

## 2019-09-10 DIAGNOSIS — F411 Generalized anxiety disorder: Secondary | ICD-10-CM | POA: Diagnosis not present

## 2019-09-10 DIAGNOSIS — I10 Essential (primary) hypertension: Secondary | ICD-10-CM | POA: Diagnosis not present

## 2019-09-10 DIAGNOSIS — G4700 Insomnia, unspecified: Secondary | ICD-10-CM | POA: Insufficient documentation

## 2019-09-10 MED ORDER — TRAZODONE HCL 50 MG PO TABS: 50.0000 mg | ORAL_TABLET | Freq: Every evening | ORAL | 3 refills | Status: DC | PRN

## 2019-09-10 MED ORDER — LORAZEPAM 1 MG PO TABS: ORAL_TABLET | ORAL | 0 refills | Status: DC

## 2019-09-10 NOTE — Progress Notes (Signed)
Richard Li - 59 y.o. male MRN [id number]  Date of birth: 1960/09/24  Subjective Chief Complaint  Patient presents with  . Medication Refill    HPI Richard Li is a 59 y.o. male with history of HTN, HLD, anxiety and insomnia here today for follow up.    -HTN:  Current management with losartan.  Doing well with this.  BP has been well controlled.  Denies symptoms related to HTN including chest pain, shortness of breath., palpitations, headache or vision changes.   -Anxiety/Insomnia:  He has tried several SSRI's with varying levels of tolerance.  Most have not been very effective.  He has been managing with lorazepam but has been out for a few weeks.  He would be open to trying something different for insomnia.    -HLD:  TG elevated previously. Current treatment with fenofibrate.  Tolerating well.  He has been working on weight loss.  Weight is down about 25 lbs over the past few months.    ROS:  A comprehensive ROS was completed and negative except as noted per HPI  Allergies  Allergen Reactions  . Codeine Itching  . Penicillins     Unknown reaction    Past Medical History:  Diagnosis Date  . Allergic rhinitis, cause unspecified   . Anxiety state, unspecified   . Chronic airway obstruction, not elsewhere classified   . Esophageal reflux   . Lumbago   . Other and unspecified hyperlipidemia   . Other chest pain     Past Surgical History:  Procedure Laterality Date  . APPENDECTOMY    . COLONOSCOPY  15 years ago  . NASAL TURBINATE REDUCTION  2002   Dr.Crossley  . VASECTOMY      Social History   Socioeconomic History  . Marital status: Married    Spouse name: Designer, industrial/product  . Number of children: Not on file  . Years of education: Not on file  . Highest education level: Not on file  Occupational History  . Occupation: truck Education administrator: [employer]  Tobacco Use  . Smoking status: Former Smoker    Packs/day: 2.00    Years: 28.00    Pack years:  56.00    Types: Cigarettes    Quit date: 04/25/2005    Years since quitting: 14.3  . Smokeless tobacco: Never Used  Substance and Sexual Activity  . Alcohol use: Yes    Alcohol/week: 12.0 standard drinks    Types: 12 Cans of beer per week    Comment: social use  . Drug use: No  . Sexual activity: Not on file  Other Topics Concern  . Not on file  Social History Narrative  . Not on file   Social Determinants of Health   Financial Resource Strain:   . Difficulty of Paying Living Expenses:   Food Insecurity:   . Worried About Charity fundraiser in the Last Year:   . Arboriculturist in the Last Year:   Transportation Needs:   . Film/video editor (Medical):   [name] Lack of Transportation (Non-Medical):   Physical Activity:   . Days of Exercise per Week:   . Minutes of Exercise per Session:   Stress:   . Feeling of Stress :   Social Connections:   . Frequency of Communication with Friends and Family:   . Frequency of Social Gatherings with Friends and Family:   . Attends Religious Services:   . Active Member of Clubs  or Organizations:   . Attends Archivist Meetings:   [name] Marital Status:     Family History  Problem Relation Age of Onset  . Breast cancer Mother   . Cancer Father   . Heart attack Brother   . Emphysema Maternal Uncle   . COPD Maternal Uncle   . Heart disease Maternal Uncle   . Colon cancer Neg Hx     Health Maintenance  Topic Date Due  . Hepatitis C Screening  Never done  . HIV Screening  Never done  . COVID-19 Vaccine (1) Never done  . INFLUENZA VACCINE  11/24/2019  . TETANUS/TDAP  12/11/2021  . COLONOSCOPY  04/23/2023     ----------------------------------------------------------------------------------------------------------------------------------------------------------------------------------------------------------------- Physical Exam Ht 5' 10.08" (1.78 m)   Wt 217 lb 11.2 oz (98.7 kg)   SpO2 100%   BMI 31.17 kg/m    Physical Exam Constitutional:      Appearance: Normal appearance.  HENT:     Head: Normocephalic and atraumatic.  Eyes:     General: No scleral icterus. Cardiovascular:     Rate and Rhythm: Normal rate and regular rhythm.  Pulmonary:     Effort: Pulmonary effort is normal.     Breath sounds: Normal breath sounds.  Musculoskeletal:     Cervical back: Neck supple.  Skin:    General: Skin is warm and dry.  Neurological:     General: No focal deficit present.     Mental Status: He is alert.  Psychiatric:        Mood and Affect: Mood normal.        Behavior: Behavior normal.     ------------------------------------------------------------------------------------------------------------------------------------------------------------------------------------------------------------------- Assessment and Plan  Essential hypertension Blood pressure is at goal at for age and co-morbidities.  I recommend he continue losartan.  In addition they were instructed to follow a low sodium diet with regular exercise to help to maintain adequate control of blood pressure.    Hyperlipidemia Tolerating fenofibrate well.  Continue at current dose.  Update lipid panel at next visit.   GAD (generalized anxiety disorder) He has tried several SSRI's without significant improvement.  Will continue clonazepam, reduced to BID as needed.    Insomnia Start trazodone 50-100mg  daily.    Meds ordered this encounter  Medications  . LORazepam (ATIVAN) 1 MG tablet    Sig: Take 1 BID prn for anxiety    Dispense:  60 tablet    Refill:  0  . traZODone (DESYREL) 50 MG tablet    Sig: Take 1-2 tablets (50-100 mg total) by mouth at bedtime as needed for sleep.    Dispense:  45 tablet    Refill:  3    Return in about 6 weeks (around 10/22/2019) for anxiety.    This visit occurred during the SARS-CoV-2 public health emergency.  Safety protocols were in place, including screening questions prior to  the visit, additional usage of staff PPE, and extensive cleaning of exam room while observing appropriate contact time as indicated for disinfecting solutions.

## 2019-09-10 NOTE — Assessment & Plan Note (Signed)
Start trazodone 50-100mg  daily.

## 2019-09-10 NOTE — Patient Instructions (Signed)
Lets try trazodone at bedtime.  I have sent in lorazepam, try to limit to two per day.  See me again in about 4-6 weeks.

## 2019-09-10 NOTE — Assessment & Plan Note (Signed)
Tolerating fenofibrate well.  Continue at current dose.  Update lipid panel at next visit.

## 2019-09-10 NOTE — Assessment & Plan Note (Signed)
He has tried several SSRI's without significant improvement.  Will continue clonazepam, reduced to BID as needed.

## 2019-09-10 NOTE — Assessment & Plan Note (Signed)
Blood pressure is at goal at for age and co-morbidities.  I recommend he continue losartan.  In addition they were instructed to follow a low sodium diet with regular exercise to help to maintain adequate control of blood pressure.

## 2019-09-16 ENCOUNTER — Other Ambulatory Visit: Payer: Self-pay

## 2019-09-16 ENCOUNTER — Encounter: Payer: Self-pay | Admitting: Family Medicine

## 2019-09-16 ENCOUNTER — Telehealth (INDEPENDENT_AMBULATORY_CARE_PROVIDER_SITE_OTHER): Payer: BC Managed Care – PPO | Admitting: Family Medicine

## 2019-09-16 DIAGNOSIS — J019 Acute sinusitis, unspecified: Secondary | ICD-10-CM

## 2019-09-16 MED ORDER — DOXYCYCLINE HYCLATE 100 MG PO TABS: 100.0000 mg | ORAL_TABLET | Freq: Two times a day (BID) | ORAL | 0 refills | Status: DC

## 2019-09-16 NOTE — Progress Notes (Signed)
Since Friday, headaches and dizziness. Not light sensitive. Forehead is sore. When he bends over there is a shooting pain to his forehead. "Severe pain" Not sure if it's a migraine or night.

## 2019-09-16 NOTE — Assessment & Plan Note (Signed)
Start doxycycline.  Continue supportive care at home with increased fluids and rest.  If developing increased respiratory symptoms we discussed having COVID test completed although at this time I think this is less likely the reason for his symptoms.  He may continue tylenol or ibuprofen as needed.  Call if not improving.

## 2019-09-16 NOTE — Progress Notes (Signed)
Richard Li - 59 y.o. male MRN [id number]  Date of birth: 10/07/1960   This visit type was conducted due to national recommendations for restrictions regarding the COVID-19 Pandemic (e.g. social distancing).  This format is felt to be most appropriate for this patient at this time.  All issues noted in this document were discussed and addressed.  No physical exam was performed (except for noted visual exam findings with Video Visits).  I discussed the limitations of evaluation and management by telemedicine and the availability of in person appointments. The patient expressed understanding and agreed to proceed.  I connected with@ on 09/16/19 at  4:00 PM EDT by a video enabled telemedicine application and verified that I am speaking with the correct person using two identifiers.  Interactive audio and video telecommunications were attempted between this provider and patient, however failed, due to patient having technical difficulties OR patient did not have access to video capability.  We continued and completed visit with audio only.    Present at visit: [name], DO [name]   Patient Location: Home [address] [place] [address]   Provider location:   [facility]  Chief Complaint  Patient presents with  . Headache    HPI  Richard Li is a 59 y.o. male who presents via audio/video conferencing for a telehealth visit today.  Reports that he began having sinus pain and pressure about 3 days ago, progressed to severe headache.  Headache is in front of head, worse if leaning forward.  Has had mild cough.  Sputum and mucus is thick, yellow/green in color.  He has had mildly elevated temp to 99.1.  Dizziness has worsened some as well since symptoms started. He denies nausea, vomiting, diarrhea, chest pain, shortness of breath, or body aches.    ROS:  A comprehensive ROS was completed and negative except as noted per HPI  Past Medical History:  Diagnosis Date  .  Allergic rhinitis, cause unspecified   . Anxiety state, unspecified   . Chronic airway obstruction, not elsewhere classified   . Esophageal reflux   . Lumbago   . Other and unspecified hyperlipidemia   . Other chest pain     Past Surgical History:  Procedure Laterality Date  . APPENDECTOMY    . COLONOSCOPY  15 years ago  . NASAL TURBINATE REDUCTION  2002   Dr.Crossley  . VASECTOMY      Family History  Problem Relation Age of Onset  . Breast cancer Mother   . Cancer Father   . Heart attack Brother   . Emphysema Maternal Uncle   . COPD Maternal Uncle   . Heart disease Maternal Uncle   . Colon cancer Neg Hx     Social History   Socioeconomic History  . Marital status: Married    Spouse name: Designer, industrial/product  . Number of children: Not on file  . Years of education: Not on file  . Highest education level: Not on file  Occupational History  . Occupation: truck Education administrator: [employer]  Tobacco Use  . Smoking status: Former Smoker    Packs/day: 2.00    Years: 28.00    Pack years: 56.00    Types: Cigarettes    Quit date: 04/25/2005    Years since quitting: 14.4  . Smokeless tobacco: Never Used  Substance and Sexual Activity  . Alcohol use: Yes    Alcohol/week: 12.0 standard drinks    Types: 12 Cans  of beer per week    Comment: social use  . Drug use: No  . Sexual activity: Not on file  Other Topics Concern  . Not on file  Social History Narrative  . Not on file   Social Determinants of Health   Financial Resource Strain:   . Difficulty of Paying Living Expenses:   Food Insecurity:   . Worried About Charity fundraiser in the Last Year:   . Arboriculturist in the Last Year:   Transportation Needs:   . Film/video editor (Medical):   [name] Lack of Transportation (Non-Medical):   Physical Activity:   . Days of Exercise per Week:   . Minutes of Exercise per Session:   Stress:   . Feeling of Stress :   Social Connections:   . Frequency of  Communication with Friends and Family:   . Frequency of Social Gatherings with Friends and Family:   . Attends Religious Services:   . Active Member of Clubs or Organizations:   . Attends Archivist Meetings:   [name] Marital Status:   Intimate Partner Violence:   . Fear of Current or Ex-Partner:   . Emotionally Abused:   [name] Physically Abused:   . Sexually Abused:      Current Outpatient Medications:  .  albuterol (PROAIR HFA) 108 (90 Base) MCG/ACT inhaler, Inhale 1-2 puffs into the lungs every 6 (six) hours as needed for wheezing or shortness of breath., Disp: 1 Inhaler, Rfl: 10 .  fenofibrate 160 MG tablet, Take 1 tablet (160 mg total) by mouth daily., Disp: 90 tablet, Rfl: 3 .  guaiFENesin (MUCINEX) 600 MG 12 hr tablet, Take 600 mg by mouth daily. 1 tab daily, Disp: , Rfl:  .  Ibuprofen-Famotidine (DUEXIS) 800-26.6 MG TABS, Duexis 800 mg-26.6 mg tablet  TAKE ONE TABLET BY MOUTH THREE TIMES DAILY, Disp: , Rfl:  .  LORazepam (ATIVAN) 1 MG tablet, Take 1 BID prn for anxiety, Disp: 60 tablet, Rfl: 0 .  losartan (COZAAR) 100 MG tablet, Take 1 tablet (100 mg total) by mouth daily., Disp: 90 tablet, Rfl: 3 .  meclizine (ANTIVERT) 25 MG tablet, TAKE 1 TABLET BY MOUTH EVERY 6 HOURS AS NEEDED FOR DIZZINESS, Disp: 100 tablet, Rfl: 5 .  doxycycline (VIBRA-TABS) 100 MG tablet, Take 1 tablet (100 mg total) by mouth 2 (two) times daily., Disp: 20 tablet, Rfl: 0 .  traZODone (DESYREL) 50 MG tablet, Take 1-2 tablets (50-100 mg total) by mouth at bedtime as needed for sleep. (Patient not taking: Reported on 09/16/2019), Disp: 45 tablet, Rfl: 3 .  valACYclovir (VALTREX) 1000 MG tablet, TAKE 2 TABLETS BY MOUTH IN THE MORNING AND TAKE 2 TABLETS IN THE EVENING AS NEEDED, Disp: , Rfl:   EXAM:  VITALS per patient if applicable: Temp 63.8 F (37.1 C) (Oral)   Wt 212 lb (96.2 kg)   BMI 30.35 kg/m   GENERAL: alert, oriented, and in no acute distress   PSYCH/NEURO: pleasant and cooperative, no obvious  depression or anxiety, speech and thought processing grossly intact  ASSESSMENT AND PLAN:  Discussed the following assessment and plan:  Acute sinusitis Start doxycycline.  Continue supportive care at home with increased fluids and rest.  If developing increased respiratory symptoms we discussed having COVID test completed although at this time I think this is less likely the reason for his symptoms.  He may continue tylenol or ibuprofen as needed.  Call if not improving.   30 minutes spent  including pre visit preparation, review of prior notes and labs, encounter with patient via video visit and same day documentation.     I discussed the assessment and treatment plan with the patient. The patient was provided an opportunity to ask questions and all were answered. The patient agreed with the plan and demonstrated an understanding of the instructions.   The patient was advised to call back or seek an in-person evaluation if the symptoms worsen or if the condition fails to improve as anticipated.    [name], DO

## 2019-10-01 ENCOUNTER — Encounter: Payer: Self-pay | Admitting: Family Medicine

## 2019-10-01 ENCOUNTER — Ambulatory Visit (INDEPENDENT_AMBULATORY_CARE_PROVIDER_SITE_OTHER): Payer: BC Managed Care – PPO | Admitting: Family Medicine

## 2019-10-01 DIAGNOSIS — J019 Acute sinusitis, unspecified: Secondary | ICD-10-CM | POA: Diagnosis not present

## 2019-10-01 DIAGNOSIS — L259 Unspecified contact dermatitis, unspecified cause: Secondary | ICD-10-CM | POA: Insufficient documentation

## 2019-10-01 DIAGNOSIS — L237 Allergic contact dermatitis due to plants, except food: Secondary | ICD-10-CM

## 2019-10-01 MED ORDER — PREDNISONE 10 MG PO TABS: 10.0000 mg | ORAL_TABLET | Freq: Every day | ORAL | 0 refills | Status: DC

## 2019-10-01 NOTE — Progress Notes (Signed)
Richard Li - 59 y.o. male MRN [id number]  Date of birth: Apr 26, 1960   This visit type was conducted due to national recommendations for restrictions regarding the COVID-19 Pandemic (e.g. social distancing).  This format is felt to be most appropriate for this patient at this time.  All issues noted in this document were discussed and addressed.  No physical exam was performed (except for noted visual exam findings with Video Visits).  I discussed the limitations of evaluation and management by telemedicine and the availability of in person appointments. The patient expressed understanding and agreed to proceed.  I connected with@ on 10/01/19 at  2:00 PM EDT by a video enabled telemedicine application and verified that I am speaking with the correct person using two identifiers.  Interactive audio and video telecommunications were attempted between this provider and patient, however failed, due to patient having technical difficulties OR patient did not have access to video capability.  We continued and completed visit with audio only.   Present at visit: [name], DO [name]   Patient Location: Home [address] [place] [address]   Provider location:   [place]  No chief complaint on file.   HPI  Richard Li is a 59 y.o. male who presents via audio/video conferencing for a telehealth visit today.  He is following up on sinusitis.  Reports that overall he is feeling better but has some residual congestion.  He denies continued sinus pain or shortness of breath.     He also has had rash for about the past 9-10 days.  Started a couple days after working in his yard planting flowers and shrubs.  Rash located on R arm, forehead area.  Rash is itchy but burns slightly.  There is some blistering appearance.  Denies fever, chills or drainage.   ROS:  A comprehensive ROS was completed and negative except as noted per HPI  Past Medical History:  Diagnosis Date  .  Allergic rhinitis, cause unspecified   . Anxiety state, unspecified   . Chronic airway obstruction, not elsewhere classified   . Esophageal reflux   . Lumbago   . Other and unspecified hyperlipidemia   . Other chest pain     Past Surgical History:  Procedure Laterality Date  . APPENDECTOMY    . COLONOSCOPY  15 years ago  . NASAL TURBINATE REDUCTION  2002   Dr.Crossley  . VASECTOMY      Family History  Problem Relation Age of Onset  . Breast cancer Mother   . Cancer Father   . Heart attack Brother   . Emphysema Maternal Uncle   . COPD Maternal Uncle   . Heart disease Maternal Uncle   . Colon cancer Neg Hx     Social History   Socioeconomic History  . Marital status: Married    Spouse name: Designer, industrial/product  . Number of children: Not on file  . Years of education: Not on file  . Highest education level: Not on file  Occupational History  . Occupation: truck Education administrator: [employer]  Tobacco Use  . Smoking status: Former Smoker    Packs/day: 2.00    Years: 28.00    Pack years: 56.00    Types: Cigarettes    Quit date: 04/25/2005    Years since quitting: 14.4  . Smokeless tobacco: Never Used  Substance and Sexual Activity  . Alcohol use: Yes    Alcohol/week: 12.0 standard drinks  Types: 12 Cans of beer per week    Comment: social use  . Drug use: No  . Sexual activity: Not on file  Other Topics Concern  . Not on file  Social History Narrative  . Not on file   Social Determinants of Health   Financial Resource Strain:   . Difficulty of Paying Living Expenses:   Food Insecurity:   . Worried About Charity fundraiser in the Last Year:   . Arboriculturist in the Last Year:   Transportation Needs:   . Film/video editor (Medical):   [name] Lack of Transportation (Non-Medical):   Physical Activity:   . Days of Exercise per Week:   . Minutes of Exercise per Session:   Stress:   . Feeling of Stress :   Social Connections:   . Frequency of  Communication with Friends and Family:   . Frequency of Social Gatherings with Friends and Family:   . Attends Religious Services:   . Active Member of Clubs or Organizations:   . Attends Archivist Meetings:   [name] Marital Status:   Intimate Partner Violence:   . Fear of Current or Ex-Partner:   . Emotionally Abused:   [name] Physically Abused:   . Sexually Abused:      Current Outpatient Medications:  .  albuterol (PROAIR HFA) 108 (90 Base) MCG/ACT inhaler, Inhale 1-2 puffs into the lungs every 6 (six) hours as needed for wheezing or shortness of breath., Disp: 1 Inhaler, Rfl: 10 .  doxycycline (VIBRA-TABS) 100 MG tablet, Take 1 tablet (100 mg total) by mouth 2 (two) times daily., Disp: 20 tablet, Rfl: 0 .  fenofibrate 160 MG tablet, Take 1 tablet (160 mg total) by mouth daily., Disp: 90 tablet, Rfl: 3 .  guaiFENesin (MUCINEX) 600 MG 12 hr tablet, Take 600 mg by mouth daily. 1 tab daily, Disp: , Rfl:  .  Ibuprofen-Famotidine (DUEXIS) 800-26.6 MG TABS, Duexis 800 mg-26.6 mg tablet  TAKE ONE TABLET BY MOUTH THREE TIMES DAILY, Disp: , Rfl:  .  LORazepam (ATIVAN) 1 MG tablet, Take 1 BID prn for anxiety, Disp: 60 tablet, Rfl: 0 .  losartan (COZAAR) 100 MG tablet, Take 1 tablet (100 mg total) by mouth daily., Disp: 90 tablet, Rfl: 3 .  meclizine (ANTIVERT) 25 MG tablet, TAKE 1 TABLET BY MOUTH EVERY 6 HOURS AS NEEDED FOR DIZZINESS, Disp: 100 tablet, Rfl: 5 .  traZODone (DESYREL) 50 MG tablet, Take 1-2 tablets (50-100 mg total) by mouth at bedtime as needed for sleep., Disp: 45 tablet, Rfl: 3 .  valACYclovir (VALTREX) 1000 MG tablet, TAKE 2 TABLETS BY MOUTH IN THE MORNING AND TAKE 2 TABLETS IN THE EVENING AS NEEDED, Disp: , Rfl:  .  predniSONE (DELTASONE) 10 MG tablet, Take 1 tablet (10 mg total) by mouth daily with breakfast. Taper 40mg  x3 days, 30mg  x3 days, 20mg  x3 days, 10mg  x 3 days, 5mg  x3 days., Disp: 32 tablet, Rfl: 0  EXAM:  VITALS per patient if applicable: Temp 71.0 F (36.9 C)    Wt 215 lb (97.5 kg)   BMI 30.78 kg/m   GENERAL: alert, oriented,  no acute distress  PSYCH/NEURO: pleasant and cooperative, no obvious depression or anxiety, speech and thought processing grossly intact  ASSESSMENT AND PLAN:  Discussed the following assessment and plan:  Contact dermatitis Start steroid taper.   He may continue calamine lotion as needed.  He will upload picture through mychart as well.  Call for new  or worsening symptoms.   Acute sinusitis Improved with antibiotics.  Still some residual congestion.  Continue supportive care for this including pushing fluids.       I discussed the assessment and treatment plan with the patient. The patient was provided an opportunity to ask questions and all were answered. The patient agreed with the plan and demonstrated an understanding of the instructions.   The patient was advised to call back or seek an in-person evaluation if the symptoms worsen or if the condition fails to improve as anticipated.    [name], DO

## 2019-10-01 NOTE — Assessment & Plan Note (Signed)
Improved with antibiotics.  Still some residual congestion.  Continue supportive care for this including pushing fluids.

## 2019-10-01 NOTE — Assessment & Plan Note (Signed)
Start steroid taper.   He may continue calamine lotion as needed.  He will upload picture through mychart as well.  Call for new or worsening symptoms.

## 2019-10-01 NOTE — Progress Notes (Signed)
Sinuses, coughing green mucus.  Completed medication x 3 - 4 days.  Rash on left arm that looks like poison ivy x 9 days. Been using calamine lotion but its not going away. Spouse had 'snake oil' also not working.

## 2019-10-03 ENCOUNTER — Encounter: Payer: Self-pay | Admitting: Family Medicine

## 2019-10-08 ENCOUNTER — Other Ambulatory Visit: Payer: Self-pay | Admitting: Family Medicine

## 2019-10-08 DIAGNOSIS — F411 Generalized anxiety disorder: Secondary | ICD-10-CM

## 2019-10-08 NOTE — Telephone Encounter (Signed)
CM-Plz see refill req/thx dmf 

## 2019-10-16 ENCOUNTER — Telehealth (INDEPENDENT_AMBULATORY_CARE_PROVIDER_SITE_OTHER): Payer: BC Managed Care – PPO | Admitting: Family Medicine

## 2019-10-16 ENCOUNTER — Encounter: Payer: Self-pay | Admitting: Family Medicine

## 2019-10-16 DIAGNOSIS — F411 Generalized anxiety disorder: Secondary | ICD-10-CM | POA: Diagnosis not present

## 2019-10-16 DIAGNOSIS — R5383 Other fatigue: Secondary | ICD-10-CM | POA: Insufficient documentation

## 2019-10-16 DIAGNOSIS — F5101 Primary insomnia: Secondary | ICD-10-CM

## 2019-10-16 NOTE — Assessment & Plan Note (Signed)
Mild at this time.  He will let me know if he would like to pursue work up for this.

## 2019-10-16 NOTE — Progress Notes (Signed)
Richard Li - 59 y.o. male MRN [id number]  Date of birth: 01/20/1961   This visit type was conducted due to national recommendations for restrictions regarding the COVID-19 Pandemic (e.g. social distancing).  This format is felt to be most appropriate for this patient at this time.  All issues noted in this document were discussed and addressed.  No physical exam was performed (except for noted visual exam findings with Video Visits).  I discussed the limitations of evaluation and management by telemedicine and the availability of in person appointments. The patient expressed understanding and agreed to proceed.  I connected with@ on 10/16/19 at  3:20 PM EDT by a video enabled telemedicine application and verified that I am speaking with the correct person using two identifiers.  Present at visit: Richard Nutting, DO Richard Li   Patient Location: Home [address] [place] [address]   Provider location:   [facility]  Chief Complaint  Patient presents with  . Anxiety    HPI  Richard Li is a 59 y.o. male who presents via audio/video conferencing for a telehealth visit today.  He is following up today for anxiety and insomnia.  Reports that overall he is doing well.  Trazodone has been helpful for sleep at 100mg  at bedtime.  He continues on lorazepam as needed for anxiety.  He has tried several ssri or SNRI's without improvement.    He also complains of some mild fatigue throughout the day.  Started a couple of weeks ago.  Not to bothersome, thinks it may be to dietary changes but if not getting better he may want to look into other causes.    ROS:  A comprehensive ROS was completed and negative except as noted per HPI  Past Medical History:  Diagnosis Date  . Allergic rhinitis, cause unspecified   . Anxiety state, unspecified   . Chronic airway obstruction, not elsewhere classified   . Esophageal reflux   . Lumbago   . Other and unspecified hyperlipidemia   .  Other chest pain     Past Surgical History:  Procedure Laterality Date  . APPENDECTOMY    . COLONOSCOPY  15 years ago  . NASAL TURBINATE REDUCTION  2002   Dr.Crossley  . VASECTOMY      Family History  Problem Relation Age of Onset  . Breast cancer Mother   . Cancer Father   . Heart attack Brother   . Emphysema Maternal Uncle   . COPD Maternal Uncle   . Heart disease Maternal Uncle   . Colon cancer Neg Hx     Social History   Socioeconomic History  . Marital status: Married    Spouse name: Designer, industrial/product  . Number of children: Not on file  . Years of education: Not on file  . Highest education level: Not on file  Occupational History  . Occupation: truck Education administrator: [employer]  Tobacco Use  . Smoking status: Former Smoker    Packs/day: 2.00    Years: 28.00    Pack years: 56.00    Types: Cigarettes    Quit date: 04/25/2005    Years since quitting: 14.4  . Smokeless tobacco: Never Used  Vaping Use  . Vaping Use: Never used  Substance and Sexual Activity  . Alcohol use: Yes    Alcohol/week: 12.0 standard drinks    Types: 12 Cans of beer per week    Comment: social use  . Drug use: No  .  Sexual activity: Not on file  Other Topics Concern  . Not on file  Social History Narrative  . Not on file   Social Determinants of Health   Financial Resource Strain:   . Difficulty of Paying Living Expenses:   Food Insecurity:   . Worried About Charity fundraiser in the Last Year:   . Arboriculturist in the Last Year:   Transportation Needs:   . Film/video editor (Medical):   [name] Lack of Transportation (Non-Medical):   Physical Activity:   . Days of Exercise per Week:   . Minutes of Exercise per Session:   Stress:   . Feeling of Stress :   Social Connections:   . Frequency of Communication with Friends and Family:   . Frequency of Social Gatherings with Friends and Family:   . Attends Religious Services:   . Active Member of Clubs or Organizations:    . Attends Archivist Meetings:   [name] Marital Status:   Intimate Partner Violence:   . Fear of Current or Ex-Partner:   . Emotionally Abused:   [name] Physically Abused:   . Sexually Abused:      Current Outpatient Medications:  .  fenofibrate 160 MG tablet, Take 1 tablet (160 mg total) by mouth daily., Disp: 90 tablet, Rfl: 3 .  LORazepam (ATIVAN) 1 MG tablet, Take 1 tablet by mouth twice daily as needed for anxiety, Disp: 60 tablet, Rfl: 1 .  losartan (COZAAR) 100 MG tablet, Take 1 tablet (100 mg total) by mouth daily., Disp: 90 tablet, Rfl: 3 .  meclizine (ANTIVERT) 25 MG tablet, TAKE 1 TABLET BY MOUTH EVERY 6 HOURS AS NEEDED FOR DIZZINESS, Disp: 100 tablet, Rfl: 5 .  traZODone (DESYREL) 50 MG tablet, Take 1-2 tablets (50-100 mg total) by mouth at bedtime as needed for sleep., Disp: 45 tablet, Rfl: 3  EXAM:  VITALS per patient if applicable: Temp 06.2 F (36.6 C) (Oral)   Ht 5\' 10"  (1.778 m)   Wt 208 lb (94.3 kg)   BMI 29.84 kg/m   GENERAL: alert, oriented, appears well and in no acute distress  HEENT: atraumatic, conjunttiva clear, no obvious abnormalities on inspection of external nose and ears  NECK: normal movements of the head and neck  LUNGS: on inspection no signs of respiratory distress, breathing rate appears normal, no obvious gross SOB, gasping or wheezing  CV: no obvious cyanosis  MS: moves all visible extremities without noticeable abnormality  PSYCH/NEURO: pleasant and cooperative, no obvious depression or anxiety, speech and thought processing grossly intact  ASSESSMENT AND PLAN:  Discussed the following assessment and plan:  Insomnia Improved with trazodone at 100mg  nightly, continue at current dose.   GAD (generalized anxiety disorder) Stable with lorazepam as needed during the day.   Has tried several SSRI's/SNRI's without improvement or poor tolerance.     Fatigue Mild at this time.  He will let me know if he would like to pursue work up  for this.   25 minutes spent including pre visit preparation, review of prior notes and labs, encounter with patient via video visit and same day documentation.    I discussed the assessment and treatment plan with the patient. The patient was provided an opportunity to ask questions and all were answered. The patient agreed with the plan and demonstrated an understanding of the instructions.   The patient was advised to call back or seek an in-person evaluation if the symptoms worsen or if  the condition fails to improve as anticipated.    Richard Nutting, DO

## 2019-10-16 NOTE — Assessment & Plan Note (Signed)
Stable with lorazepam as needed during the day.   Has tried several SSRI's/SNRI's without improvement or poor tolerance.

## 2019-10-16 NOTE — Assessment & Plan Note (Signed)
Improved with trazodone at 100mg  nightly, continue at current dose.

## 2019-10-17 ENCOUNTER — Telehealth: Payer: BC Managed Care – PPO | Admitting: Family Medicine

## 2019-11-06 ENCOUNTER — Ambulatory Visit (INDEPENDENT_AMBULATORY_CARE_PROVIDER_SITE_OTHER): Payer: BC Managed Care – PPO | Admitting: Family Medicine

## 2019-11-06 ENCOUNTER — Encounter: Payer: Self-pay | Admitting: Family Medicine

## 2019-11-06 VITALS — BP 106/55 | HR 80 | Temp 97.8°F | Ht 70.08 in | Wt 209.0 lb

## 2019-11-06 DIAGNOSIS — E785 Hyperlipidemia, unspecified: Secondary | ICD-10-CM | POA: Diagnosis not present

## 2019-11-06 DIAGNOSIS — I10 Essential (primary) hypertension: Secondary | ICD-10-CM | POA: Diagnosis not present

## 2019-11-06 DIAGNOSIS — J0100 Acute maxillary sinusitis, unspecified: Secondary | ICD-10-CM | POA: Diagnosis not present

## 2019-11-06 DIAGNOSIS — N529 Male erectile dysfunction, unspecified: Secondary | ICD-10-CM

## 2019-11-06 MED ORDER — CEFDINIR 300 MG PO CAPS: 300.0000 mg | ORAL_CAPSULE | Freq: Two times a day (BID) | ORAL | 0 refills | Status: AC

## 2019-11-06 MED ORDER — SILDENAFIL CITRATE 100 MG PO TABS: 50.0000 mg | ORAL_TABLET | Freq: Every day | ORAL | 11 refills | Status: DC | PRN

## 2019-11-06 NOTE — Assessment & Plan Note (Signed)
Prior episode never fully resolved.  Will start omnicef 300mg  bid x10 days.  Discussed if not improving with this we'll need to obtain CT scan.

## 2019-11-06 NOTE — Assessment & Plan Note (Signed)
Tolerating fenofibrate well.  Update lipid panel.

## 2019-11-06 NOTE — Patient Instructions (Signed)
Start cefdinir.  If symptoms do not improve after this let me know and we'll get CT of sinuses ordered.  I have sent over viagra.  Avoid any medications that contain nitroglycerine while taking this.

## 2019-11-06 NOTE — Progress Notes (Signed)
Richard Li - 59 y.o. male MRN [id number]  Date of birth: 07-05-1960  Subjective Chief Complaint  Patient presents with  . Sinus Problem    HPI Richard Li is a 59 y.o. male with history fo HTN, HLD and anxiety here today with complaint of recurrent sinusitis.    He was seen in May for sinusitis, treated with doxycycline.  Had some improvement but symtpoms returned shortly afterwards.  Current symptoms including sinus pain, post nasal drainage and cough with thick green mucus.  He denies fever or chills, chest tightness or shortness of breath.  He has had previous sinus surgery several years ago.   He is also interested in trying sildenafil for ED.  Erections not as strong and full as they have been in the past.  Denies any issues with chest pain or exertional dyspnea. HTN has been well controlled.   ROS:  A comprehensive ROS was completed and negative except as noted per HPI     Allergies  Allergen Reactions  . Codeine Itching  . Penicillins     Unknown reaction    Past Medical History:  Diagnosis Date  . Allergic rhinitis, cause unspecified   . Anxiety state, unspecified   . Chronic airway obstruction, not elsewhere classified   . Esophageal reflux   . Lumbago   . Other and unspecified hyperlipidemia   . Other chest pain     Past Surgical History:  Procedure Laterality Date  . APPENDECTOMY    . COLONOSCOPY  15 years ago  . NASAL TURBINATE REDUCTION  2002   Dr.Crossley  . VASECTOMY      Social History   Socioeconomic History  . Marital status: Married    Spouse name: Designer, industrial/product  . Number of children: Not on file  . Years of education: Not on file  . Highest education level: Not on file  Occupational History  . Occupation: truck Education administrator: [employer]  Tobacco Use  . Smoking status: Former Smoker    Packs/day: 2.00    Years: 28.00    Pack years: 56.00    Types: Cigarettes    Quit date: 04/25/2005    Years since quitting: 14.5  .  Smokeless tobacco: Never Used  Vaping Use  . Vaping Use: Never used  Substance and Sexual Activity  . Alcohol use: Yes    Alcohol/week: 12.0 standard drinks    Types: 12 Cans of beer per week    Comment: social use  . Drug use: No  . Sexual activity: Not on file  Other Topics Concern  . Not on file  Social History Narrative  . Not on file   Social Determinants of Health   Financial Resource Strain:   . Difficulty of Paying Living Expenses:   Food Insecurity:   . Worried About Charity fundraiser in the Last Year:   . Arboriculturist in the Last Year:   Transportation Needs:   . Film/video editor (Medical):   [name] Lack of Transportation (Non-Medical):   Physical Activity:   . Days of Exercise per Week:   . Minutes of Exercise per Session:   Stress:   . Feeling of Stress :   Social Connections:   . Frequency of Communication with Friends and Family:   . Frequency of Social Gatherings with Friends and Family:   . Attends Religious Services:   . Active Member of Clubs or Organizations:   . Attends Club or  Organization Meetings:   [name] Marital Status:     Family History  Problem Relation Age of Onset  . Breast cancer Mother   . Cancer Father   . Heart attack Brother   . Emphysema Maternal Uncle   . COPD Maternal Uncle   . Heart disease Maternal Uncle   . Colon cancer Neg Hx     Health Maintenance  Topic Date Due  . Hepatitis C Screening  Never done  . COVID-19 Vaccine (1) Never done  . HIV Screening  Never done  . INFLUENZA VACCINE  11/24/2019  . TETANUS/TDAP  12/11/2021  . COLONOSCOPY  04/23/2023     ----------------------------------------------------------------------------------------------------------------------------------------------------------------------------------------------------------------- Physical Exam BP (!) 106/55 (BP Location: Left Arm, Patient Position: Sitting, Cuff Size: Large)   Pulse 80   Temp 97.8 F (36.6 C) (Oral)   Ht 5'  10.08" (1.78 m)   Wt 209 lb (94.8 kg)   SpO2 100%   BMI 29.92 kg/m   Physical Exam Constitutional:      Appearance: Normal appearance.  HENT:     Head: Normocephalic and atraumatic.     Right Ear: Tympanic membrane normal.     Left Ear: Tympanic membrane normal.     Nose:     Comments: B/l maxillary sinus tenderness  Eyes:     General: No scleral icterus. Cardiovascular:     Rate and Rhythm: Normal rate and regular rhythm.  Pulmonary:     Effort: Pulmonary effort is normal.     Breath sounds: Normal breath sounds.  Musculoskeletal:     Cervical back: Neck supple.  Neurological:     General: No focal deficit present.     Mental Status: He is alert.  Psychiatric:        Mood and Affect: Mood normal.        Behavior: Behavior normal.     ------------------------------------------------------------------------------------------------------------------------------------------------------------------------------------------------------------------- Assessment and Plan  Acute sinusitis Prior episode never fully resolved.  Will start omnicef 300mg  bid x10 days.  Discussed if not improving with this we'll need to obtain CT scan.   Essential hypertension Blood pressure is at goal at for age and co-morbidities.  I recommend continuation of current antihypertensives.  In addition they were instructed to follow a low sodium diet with regular exercise to help to maintain adequate control of blood pressure.    Erectile dysfunction Will provide trial of sildenafil.  Side effects reviewed.  Discussed red flags including prolonged erection and avoidance of nitrates.    Hyperlipidemia Tolerating fenofibrate well.  Update lipid panel.    Meds ordered this encounter  Medications  . cefdinir (OMNICEF) 300 MG capsule    Sig: Take 1 capsule (300 mg total) by mouth 2 (two) times daily for 10 days.    Dispense:  20 capsule    Refill:  0  . sildenafil (VIAGRA) 100 MG tablet     Sig: Take 0.5-1 tablets (50-100 mg total) by mouth daily as needed for erectile dysfunction.    Dispense:  10 tablet    Refill:  11    No follow-ups on file.    This visit occurred during the SARS-CoV-2 public health emergency.  Safety protocols were in place, including screening questions prior to the visit, additional usage of staff PPE, and extensive cleaning of exam room while observing appropriate contact time as indicated for disinfecting solutions.

## 2019-11-06 NOTE — Assessment & Plan Note (Signed)
Will provide trial of sildenafil.  Side effects reviewed.  Discussed red flags including prolonged erection and avoidance of nitrates.

## 2019-11-06 NOTE — Assessment & Plan Note (Signed)
Blood pressure is at goal at for age and co-morbidities.  I recommend continuation of current antihypertensives.  In addition they were instructed to follow a low sodium diet with regular exercise to help to maintain adequate control of blood pressure.

## 2019-11-07 LAB — CBC
HCT: 40 % (ref 38.5–50.0)
Hemoglobin: 13.6 g/dL (ref 13.2–17.1)
MCH: 30.5 pg (ref 27.0–33.0)
MCHC: 34 g/dL (ref 32.0–36.0)
MCV: 89.7 fL (ref 80.0–100.0)
MPV: 8.4 fL (ref 7.5–12.5)
Platelets: 346 10*3/uL (ref 140–400)
RBC: 4.46 10*6/uL (ref 4.20–5.80)
RDW: 12.9 % (ref 11.0–15.0)
WBC: 8.3 10*3/uL (ref 3.8–10.8)

## 2019-11-07 LAB — LIPID PANEL
Cholesterol: 146 mg/dL (ref <200)
HDL: 37 mg/dL — ABNORMAL LOW (ref >=40)
LDL Cholesterol (Calc): 90 mg/dL (calc)
Non-HDL Cholesterol (Calc): 109 mg/dL (calc) (ref <130)
Total CHOL/HDL Ratio: 3.9 (calc) (ref <5.0)
Triglycerides: 92 mg/dL (ref <150)

## 2019-11-07 LAB — COMPLETE METABOLIC PANEL WITH GFR
AG Ratio: 1.6 (calc) (ref 1.0–2.5)
ALT: 12 U/L (ref 9–46)
AST: 18 U/L (ref 10–35)
Albumin: 4.2 g/dL (ref 3.6–5.1)
Alkaline phosphatase (APISO): 67 U/L (ref 35–144)
BUN: 15 mg/dL (ref 7–25)
CO2: 29 mmol/L (ref 20–32)
Calcium: 9.3 mg/dL (ref 8.6–10.3)
Chloride: 100 mmol/L (ref 98–110)
Creat: 1.26 mg/dL (ref 0.70–1.33)
GFR, Est African American: 72 mL/min/{1.73_m2} (ref >=60)
GFR, Est Non African American: 62 mL/min/{1.73_m2} (ref >=60)
Globulin: 2.6 g/dL (calc) (ref 1.9–3.7)
Glucose, Bld: 91 mg/dL (ref 65–99)
Potassium: 4.4 mmol/L (ref 3.5–5.3)
Sodium: 136 mmol/L (ref 135–146)
Total Bilirubin: 0.8 mg/dL (ref 0.2–1.2)
Total Protein: 6.8 g/dL (ref 6.1–8.1)

## 2019-11-24 ENCOUNTER — Encounter: Payer: Self-pay | Admitting: Family Medicine

## 2019-11-28 ENCOUNTER — Other Ambulatory Visit: Payer: Self-pay | Admitting: Family Medicine

## 2019-11-28 MED ORDER — CEFDINIR 300 MG PO CAPS: 300.0000 mg | ORAL_CAPSULE | Freq: Two times a day (BID) | ORAL | 0 refills | Status: AC

## 2019-12-04 ENCOUNTER — Other Ambulatory Visit: Payer: Self-pay | Admitting: Family Medicine

## 2019-12-04 DIAGNOSIS — F411 Generalized anxiety disorder: Secondary | ICD-10-CM

## 2019-12-17 ENCOUNTER — Encounter: Payer: Self-pay | Admitting: Family Medicine

## 2019-12-19 ENCOUNTER — Telehealth (INDEPENDENT_AMBULATORY_CARE_PROVIDER_SITE_OTHER): Payer: BC Managed Care – PPO | Admitting: Family Medicine

## 2019-12-19 ENCOUNTER — Encounter: Payer: Self-pay | Admitting: Family Medicine

## 2019-12-19 ENCOUNTER — Other Ambulatory Visit: Payer: Self-pay

## 2019-12-19 ENCOUNTER — Ambulatory Visit (INDEPENDENT_AMBULATORY_CARE_PROVIDER_SITE_OTHER): Payer: BC Managed Care – PPO

## 2019-12-19 DIAGNOSIS — J441 Chronic obstructive pulmonary disease with (acute) exacerbation: Secondary | ICD-10-CM | POA: Diagnosis not present

## 2019-12-19 DIAGNOSIS — R05 Cough: Secondary | ICD-10-CM | POA: Diagnosis not present

## 2019-12-19 DIAGNOSIS — R0602 Shortness of breath: Secondary | ICD-10-CM

## 2019-12-19 DIAGNOSIS — I700 Atherosclerosis of aorta: Secondary | ICD-10-CM

## 2019-12-19 DIAGNOSIS — R9389 Abnormal findings on diagnostic imaging of other specified body structures: Secondary | ICD-10-CM

## 2019-12-19 DIAGNOSIS — J329 Chronic sinusitis, unspecified: Secondary | ICD-10-CM | POA: Diagnosis not present

## 2019-12-19 DIAGNOSIS — J984 Other disorders of lung: Secondary | ICD-10-CM

## 2019-12-19 DIAGNOSIS — R059 Cough, unspecified: Secondary | ICD-10-CM

## 2019-12-19 IMAGING — DX DG CHEST 2V
2 series · 2 of 2 positions shown · non-contrast
Comparison: Chest radiograph dated [DATE].

CLINICAL DATA: 59-year-old male with cough.

EXAM:
CHEST - 2 VIEW

[chest pa]
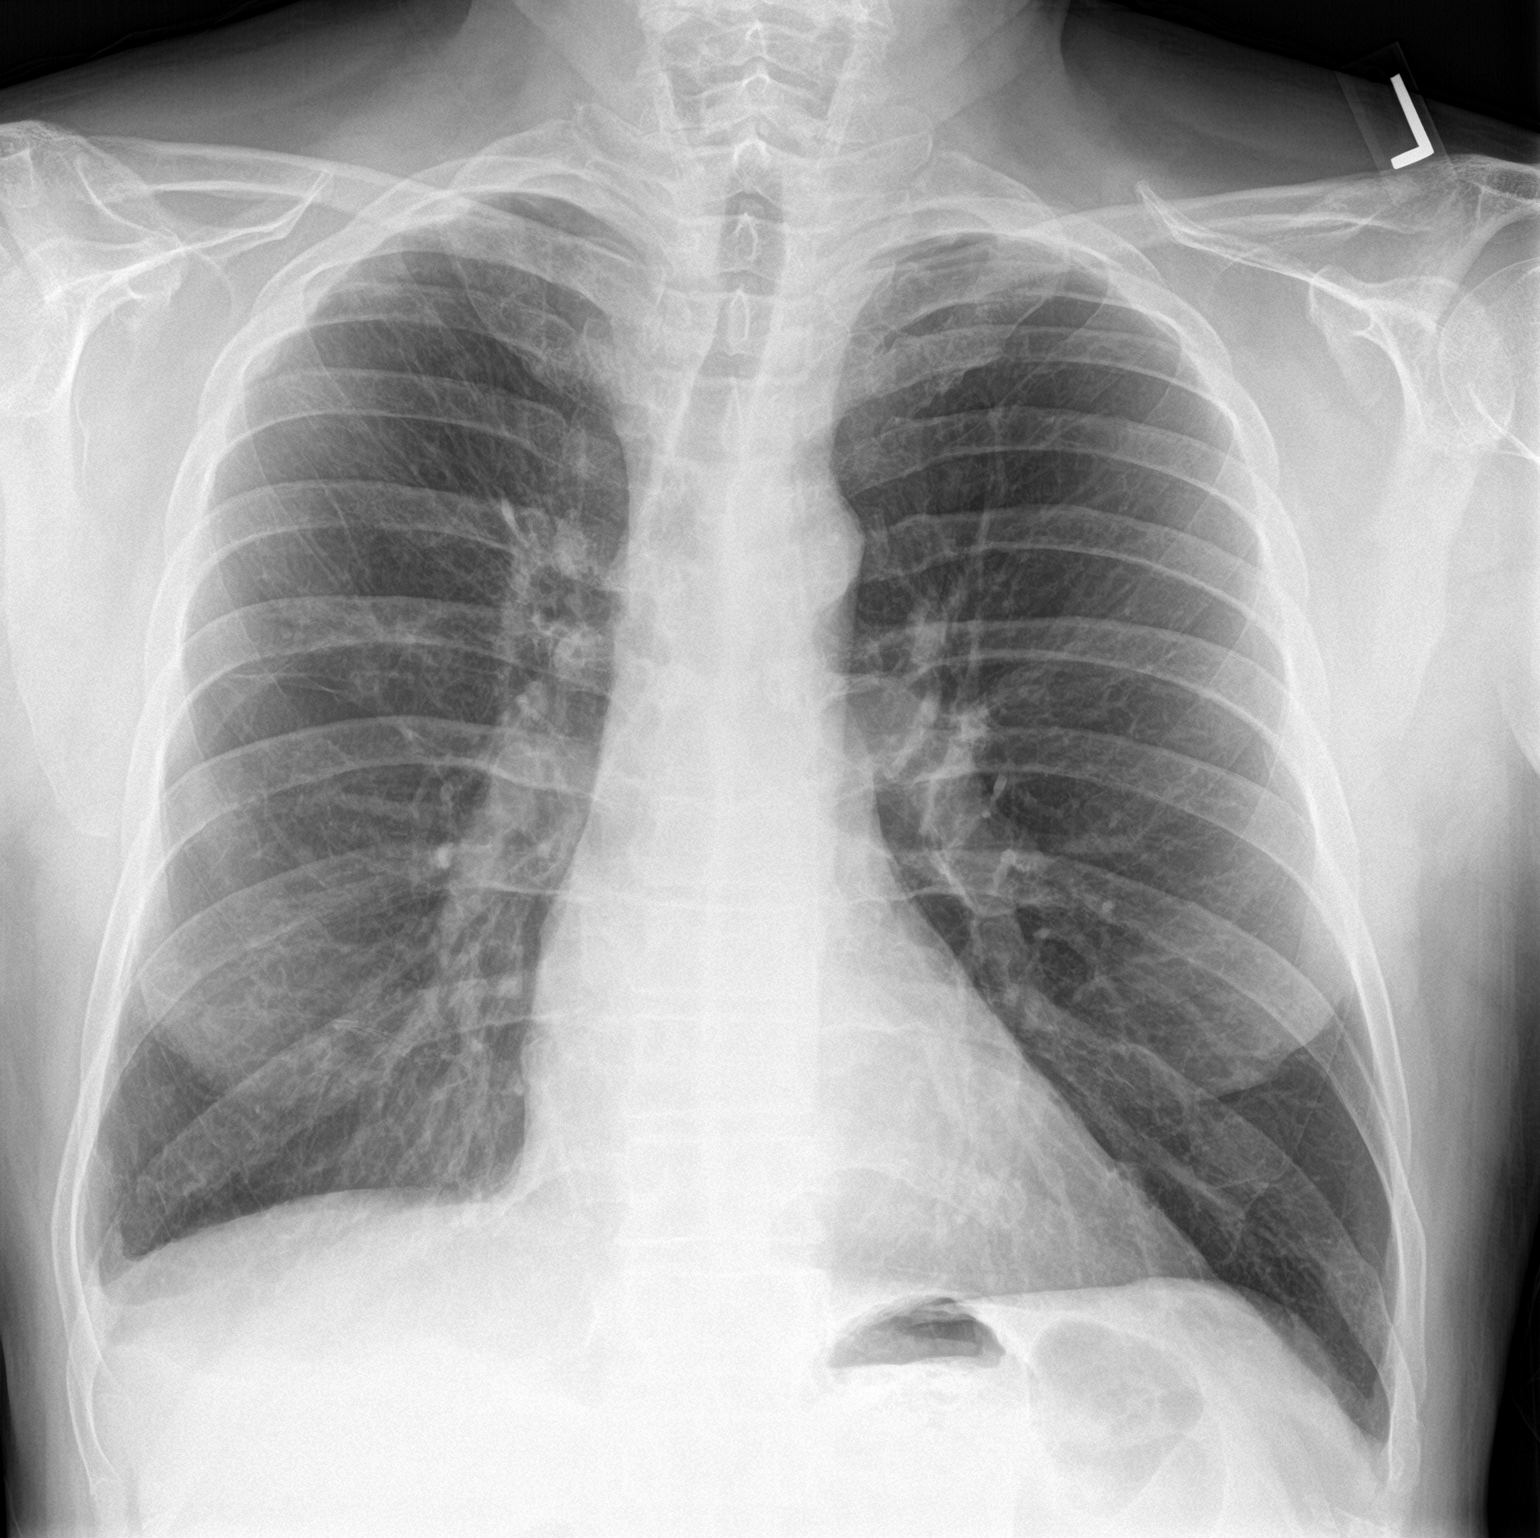

[chest lat]
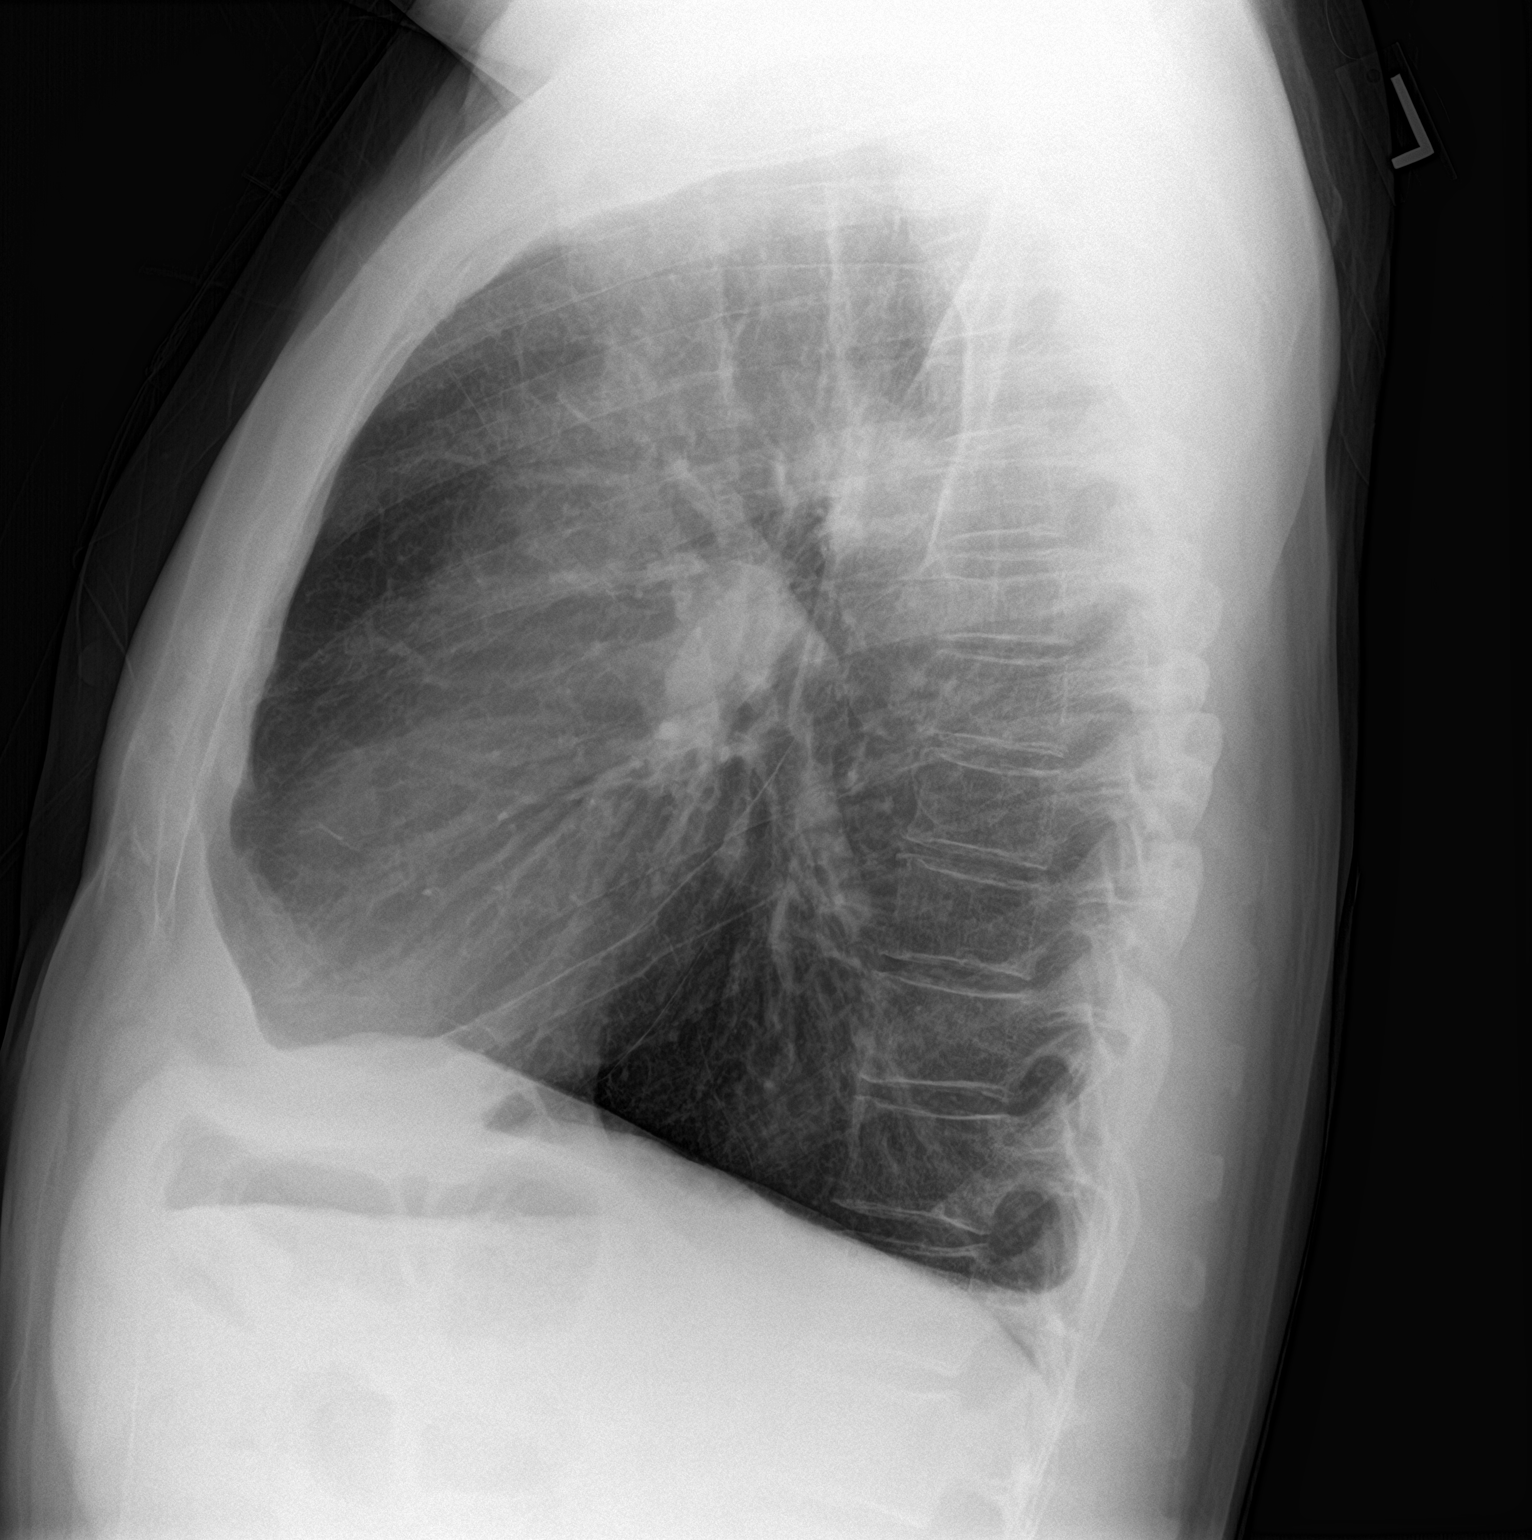

[2 of 2 positions shown; findings below may reference images not displayed]

FINDINGS: Faint right mid lung field density may be artifactual or represent
developing atypical infiltrate. Clinical correlation and follow-up
recommended. Right apical subpleural densities appear similar to
prior radiograph and chronic. There is blunting of the right
costophrenic angle, chronic and likely related to scarring. No lobar
consolidation or pneumothorax. The cardiac silhouette is within
limits. No acute osseous pathology.
IMPRESSION: Artifact versus developing atypical infiltrate in the right mid lung
field.

## 2019-12-19 NOTE — Progress Notes (Addendum)
Virtual Visit via Video Note  I connected with Richard Li on 12/21/19 at  4:00 PM EDT by a video enabled telemedicine application and verified that I am speaking with the correct person using two identifiers.   I discussed the limitations of evaluation and management by telemedicine and the availability of in person appointments. The patient expressed understanding and agreed to proceed.  Patient location: in care Provider location: in office  Subjective:    CC: chest congestion.    HPI: Richard Li is a 59 year old male who is a former smoker with a current history of COPD/emphysema.  Chest x-ray was in 2018 with Dr. Teressa Lower.  Per patient messages from August 1 he reported that the congestion in his chest and sinuses have started to come back.  It felt like it was the worst sinus infection that he had ever had.  To Zigmund Daniel decided to do another round of antibiotics at that point.  Patient reached out again on August 24 saying that he still had significant symptoms including green-colored mucus.  He felt like his sinuses were better but was having a lot of congestion in his chest and complaining of shortness of breath.  He was given Omnicef for sinus sxs in July.  2nd round of cefdinir in August x 10 days.    He says his sinuses do feel better.  He is not getting the headaches like he was that actually seems to be better he still feels like he is getting a little bit of mild congestion in the maxillary sinus area but not nearly what he was he is more concerned about his chest.  He still feels a little bit short of breath he has been able to go to work but he still coughing and getting up some green chunky sputum.  He has been using his inhaler twice a day.  Not sure of the specific inhaler that he has it looks like at one point he was given a prescription for proair so I suspect that is likely what he has.    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  Past medical history, Surgical history, Family history not pertinant except as noted below, Social history, Allergies, and medications have been entered into the medical record, reviewed, and corrections made.   Review of Systems: No fevers, chills, night sweats, weight loss, chest pain, or shortness of breath.   Objective:    General: Speaking clearly in complete sentences without any shortness of breath.  Alert and oriented x3.  Normal judgment. No apparent acute distress.    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 Started on [employer].  CT ordered bc of prolonged illness.Showed cavitating mass with spiculation in the right lung. Will refer to pulm ASAP. Augmentin added. He was willing to retry though on allergy list.    Persistent cough greater than 4 weeks-we will go ahead and get chest x-ray for further work-up today with his history of COPD may also consider CT scan for further work-up just to rule out other causes including cancer etc.  Right now his cough is mostly productive so concerning for persistent infection though he is afebrile and has felt well enough to work.  No night sweats.  But still consider tuberculosis. Consider COVID with residual  sxs.  At one point he described the sinusitis as the most severe that he never had especially with headache that lasted for several weeks.  Consider that this could have actually been an exposure to Covid and he may actually just be having lingering symptoms at this point.  Recurrent sinusitis - In looking back over his records he was actually treated for sinusitis in December, and then in May, July, and August.  He would likely benefit for referral to allergy and immunology and/or ENT if he has not seen them in the past.  COPD exacerbation-he has quit smoking since 2007.  Last  spirometry showed mild to moderate airflow obstruction.   Time spent in encounter 25 minutes  I discussed the assessment and treatment plan with the patient. The patient was provided an opportunity to ask questions and all were answered. The patient agreed with the plan and demonstrated an understanding of the instructions.   The patient was advised to call back or seek an in-person evaluation if the symptoms worsen or if the condition fails to improve as anticipated.   Beatrice Lecher, MD

## 2019-12-19 NOTE — H&P (View-Only) (Signed)
Virtual Visit via Video Note  I connected with Richard Li on 12/21/19 at  4:00 PM EDT by a video enabled telemedicine application and verified that I am speaking with the correct person using two identifiers.   I discussed the limitations of evaluation and management by telemedicine and the availability of in person appointments. The patient expressed understanding and agreed to proceed.  Patient location: in care Provider location: in office  Subjective:    CC: chest congestion.    HPI: Richard Li is a 59 year old male who is a former smoker with a current history of COPD/emphysema.  Chest x-ray was in 2018 with Dr. Teressa Lower.  Per patient messages from August 1 he reported that the congestion in his chest and sinuses have started to come back.  It felt like it was the worst sinus infection that he had ever had.  To Richard Li decided to do another round of antibiotics at that point.  Patient reached out again on August 24 saying that he still had significant symptoms including green-colored mucus.  He felt like his sinuses were better but was having a lot of congestion in his chest and complaining of shortness of breath.  He was given Omnicef for sinus sxs in July.  2nd round of cefdinir in August x 10 days.    He says his sinuses do feel better.  He is not getting the headaches like he was that actually seems to be better he still feels like he is getting a little bit of mild congestion in the maxillary sinus area but not nearly what he was he is more concerned about his chest.  He still feels a little bit short of breath he has been able to go to work but he still coughing and getting up some green chunky sputum.  He has been using his inhaler twice a day.  Not sure of the specific inhaler that he has it looks like at one point he was given a prescription for proair so I suspect that is likely what he has.    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  Past medical history, Surgical history, Family history not pertinant except as noted below, Social history, Allergies, and medications have been entered into the medical record, reviewed, and corrections made.   Review of Systems: No fevers, chills, night sweats, weight loss, chest pain, or shortness of breath.   Objective:    General: Speaking clearly in complete sentences without any shortness of breath.  Alert and oriented x3.  Normal judgment. No apparent acute distress.    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 Started on [employer].  CT ordered bc of prolonged illness.Showed cavitating mass with spiculation in the right lung. Will refer to pulm ASAP. Augmentin added. He was willing to retry though on allergy list.    Persistent cough greater than 4 weeks-we will go ahead and get chest x-ray for further work-up today with his history of COPD may also consider CT scan for further work-up just to rule out other causes including cancer etc.  Right now his cough is mostly productive so concerning for persistent infection though he is afebrile and has felt well enough to work.  No night sweats.  But still consider tuberculosis. Consider COVID with residual  sxs.  At one point he described the sinusitis as the most severe that he never had especially with headache that lasted for several weeks.  Consider that this could have actually been an exposure to Covid and he may actually just be having lingering symptoms at this point.  Recurrent sinusitis - In looking back over his records he was actually treated for sinusitis in December, and then in May, July, and August.  He would likely benefit for referral to allergy and immunology and/or ENT if he has not seen them in the past.  COPD exacerbation-he has quit smoking since 2007.  Last  spirometry showed mild to moderate airflow obstruction.   Time spent in encounter 25 minutes  I discussed the assessment and treatment plan with the patient. The patient was provided an opportunity to ask questions and all were answered. The patient agreed with the plan and demonstrated an understanding of the instructions.   The patient was advised to call back or seek an in-person evaluation if the symptoms worsen or if the condition fails to improve as anticipated.   Richard Lecher, MD

## 2019-12-20 ENCOUNTER — Encounter: Payer: Self-pay | Admitting: Family Medicine

## 2019-12-20 ENCOUNTER — Ambulatory Visit (INDEPENDENT_AMBULATORY_CARE_PROVIDER_SITE_OTHER): Payer: BC Managed Care – PPO

## 2019-12-20 DIAGNOSIS — R05 Cough: Secondary | ICD-10-CM | POA: Diagnosis not present

## 2019-12-20 DIAGNOSIS — J441 Chronic obstructive pulmonary disease with (acute) exacerbation: Secondary | ICD-10-CM

## 2019-12-20 DIAGNOSIS — R0602 Shortness of breath: Secondary | ICD-10-CM

## 2019-12-20 DIAGNOSIS — R9389 Abnormal findings on diagnostic imaging of other specified body structures: Secondary | ICD-10-CM

## 2019-12-20 DIAGNOSIS — R059 Cough, unspecified: Secondary | ICD-10-CM

## 2019-12-20 IMAGING — CT CT CHEST W/O CM
2 of 5 series · 14 of 36 positions shown, 17 images · non-contrast
Comparison: Chest radiograph yesterday.

CLINICAL DATA: Right midlung opacity on radiograph. Shortness of
breath for 1 week.

EXAM:
CT CHEST WITHOUT CONTRAST
TECHNIQUE: Multidetector CT imaging of the chest was performed following the
standard protocol without IV contrast.

[Series 5: coronal · coronal · 0.73mm/px · 3 of 143 slices shown]
[im 29/143  lung]
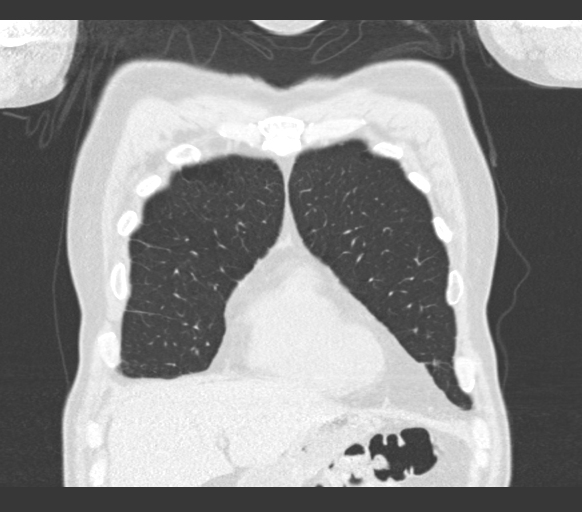
[im 57/143  lung]
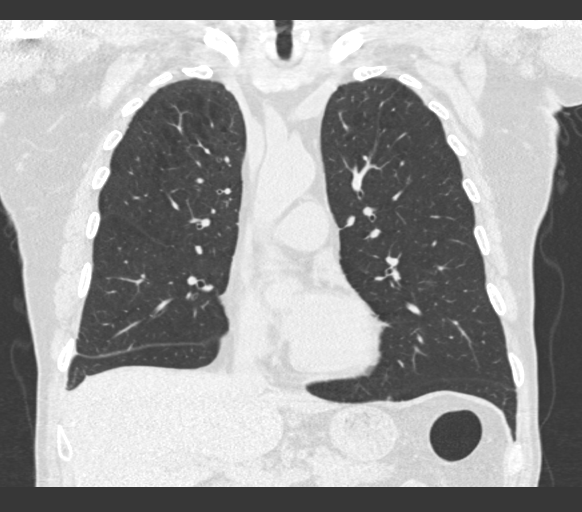
[im 86/143  lung]
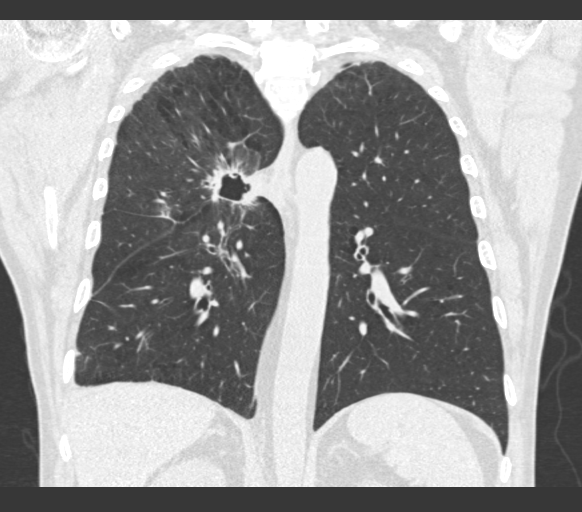

[Series 9: super d · axial · 0.71mm/px · z∈[-350,-50]mm · 11 of 427 slices shown, 14 images]
[im 26/427  mediastinal]
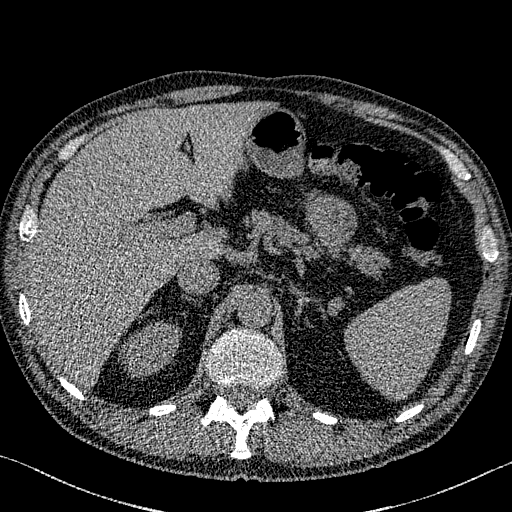
[im 26/427  lung]
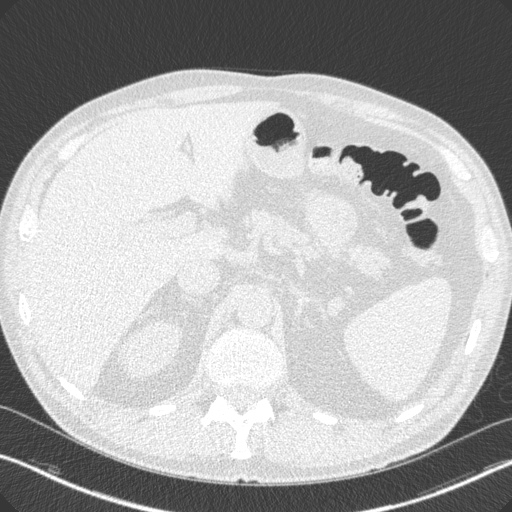
[im 76/427  lung]
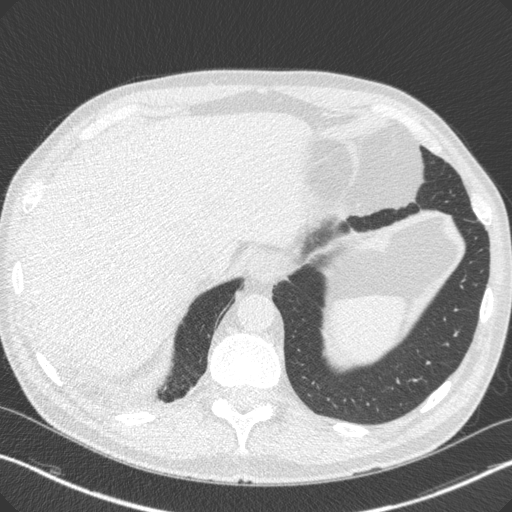
[im 101/427  lung]
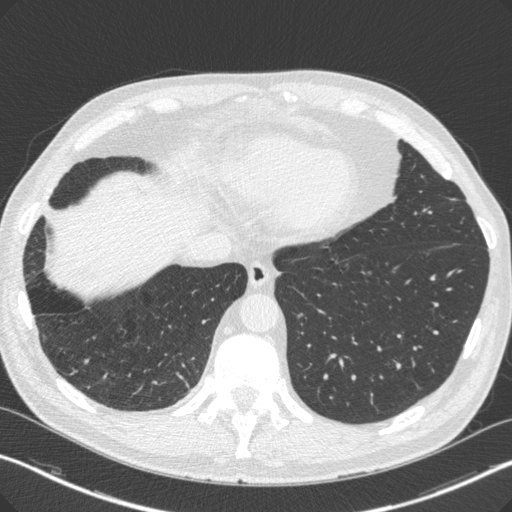
[im 151/427  lung]
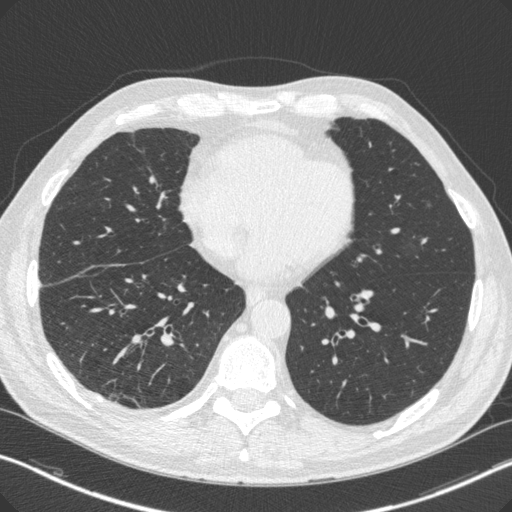
[im 176/427  mediastinal]
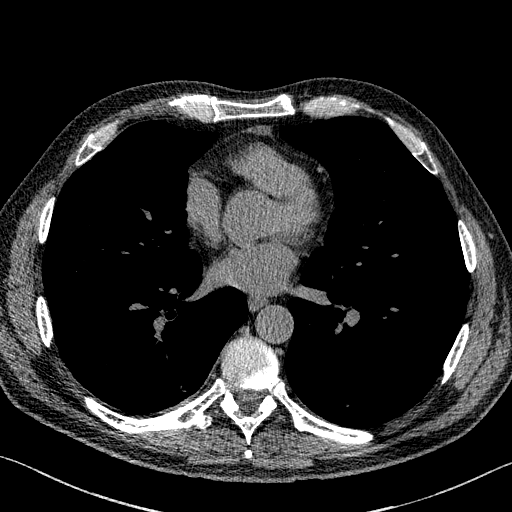
[im 176/427  lung]
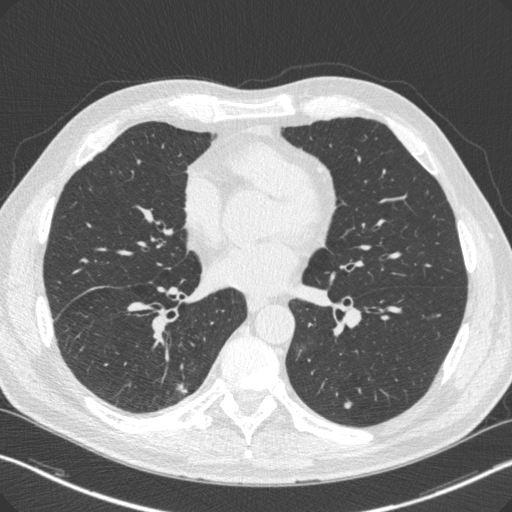
[im 226/427  lung]
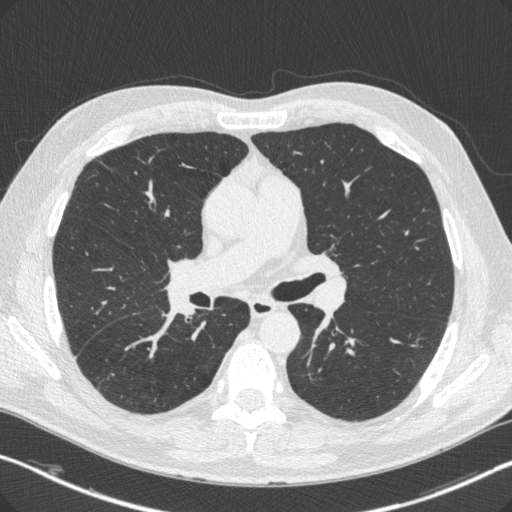
[im 251/427  lung]
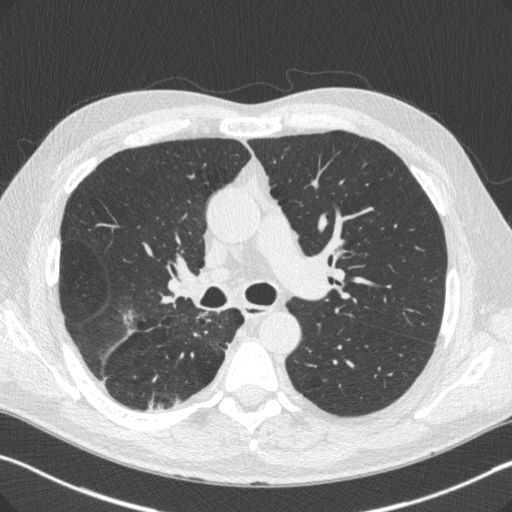
[im 276/427  lung]
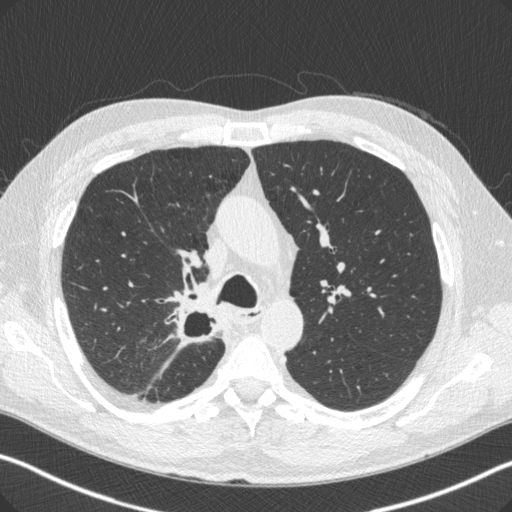
[im 326/427  mediastinal]
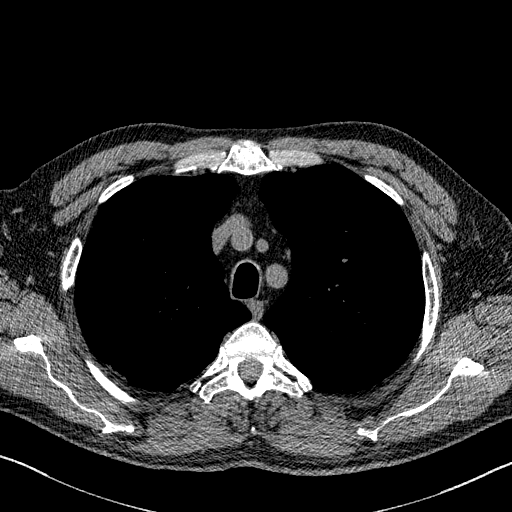
[im 326/427  lung]
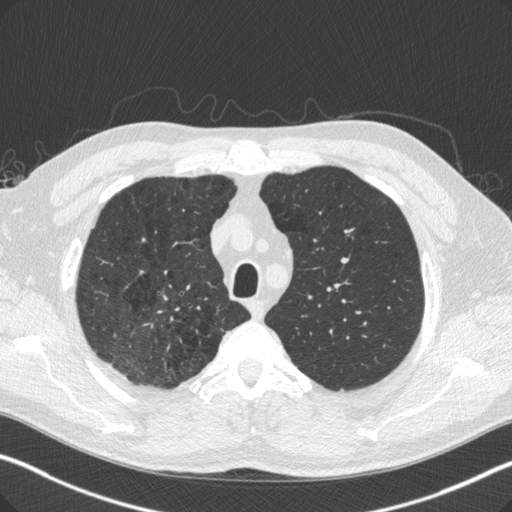
[im 351/427  lung]
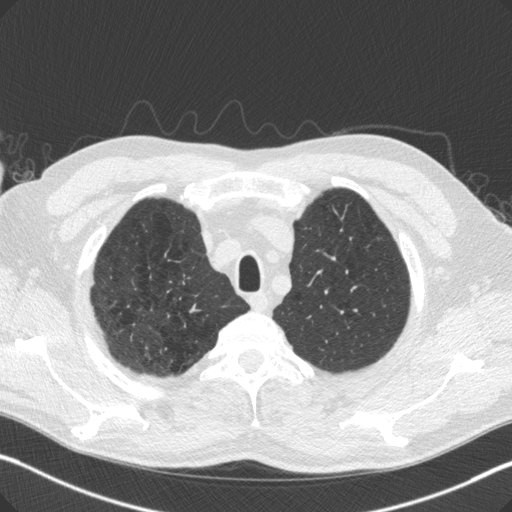
[im 401/427  lung]
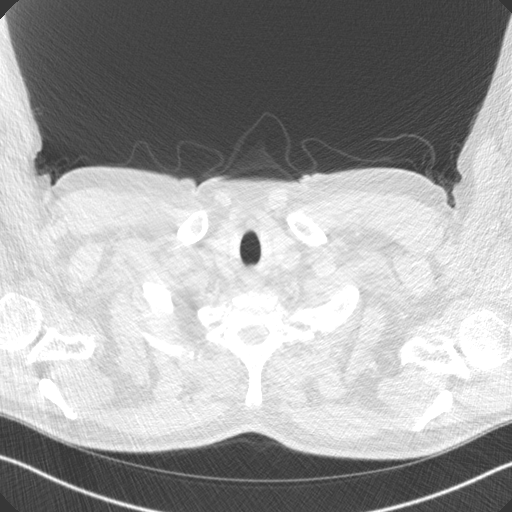

[14 of 36 positions shown; findings below may reference images not displayed]

FINDINGS: Cardiovascular: Atherosclerosis of the thoracic aorta. No aortic
aneurysm. The heart is normal in size. Trace pericardial fluid
anteriorly measuring up to 7 mm in depth.

Mediastinum/Nodes: Small subcarinal node measuring 7 mm short axis.
No other enlarged mediastinal nodes. Limited assessment for hilar
adenopathy given lack of IV contrast. Central right lung cavitary
lesion causes narrowing of the right upper lobe bronchus and slight
retraction of the distal right aspect of the trachea. No esophageal
wall thickening. No thyroid nodule.

Lungs/Pleura: Central right perihilar cavitary lesion spans the
fissure but is primarily centered in the right upper lobe measuring
3.2 x 2.7 x 2.5 cm, series 4, image 62. Lesion is thick walled with
irregular margins and surrounding spiculations. Lesion causes
narrowing and mass effect on the right upper lobe bronchus with
short segment segmental bronchial occlusion to the apical segment.
There is slight tenting of the adjacent trachea. There is fissural
thickening extending along the major fissure that is slightly
nodular, and mild smooth fissural thickening along the minor
fissure. Faint nodular opacity at the fissural confluence.
Ground-glass nodule in the periphery of the right middle lobe
measures 12 mm, series 4, image 88. There is subpleural 7 mm nodule
in the right middle lobe anteriorly same image. Ill-defined
subpleural nodularity in the superior segment of the right lower
lobe, series 4, image 101. 4 mm subpleural right middle lobe nodule
series 4, image 109.

7 mm contralateral left lower lobe nodule series 4, image 100.
Subsegmental linear atelectasis or scarring in the lingula.

Moderate emphysema.  Right upper lobe bronchial thickening.

Mild pleural thickening in the dependent right hemithorax with
pleural calcification, series 2, image 45. No significant pleural
effusion.

Upper Abdomen: No adrenal nodule. No evidence of focal liver lesion.
The included spleen, pancreas, stomach, and colon are unremarkable.

Musculoskeletal: No lytic or blastic osseous lesions. No acute
osseous abnormalities.
IMPRESSION: 1. Central right perihilar cavitary lesion measuring 3.2 x 2.7 x
cm. Margins are irregular and thick walled and there is mass effect
on the adjacent right upper lobe bronchus and tenting of the
trachea. Findings are highly suspicious for cavitary malignancy.
Possibility of cavitary pneumonia is also considered but felt less
likely. Recommend pulmonary referral for further workup and
consideration of possible bronchoscopy versus PET CT.
2. Ground-glass nodule in the right middle lobe measuring 11 mm,
nonspecific. There are small subpleural solid nodules in the right
middle and right upper lobe.
3. Subcentimeter 7 mm nodule in the contralateral left lower lobe.
4. Mild pleural thickening in the dependent right hemithorax with
pleural calcification, possibly related to scarring.
5. Moderate emphysema.

Aortic Atherosclerosis ([4Z]-[4Z]) and Emphysema ([4Z]-[4Z]).

These results will be called to the ordering clinician or
representative by the Radiologist Assistant, and communication
documented in the PACS or [REDACTED].

## 2019-12-20 MED ORDER — AZITHROMYCIN 250 MG PO TABS: ORAL_TABLET | ORAL | 0 refills | Status: AC

## 2019-12-20 NOTE — Addendum Note (Signed)
Addended by: Beatrice Lecher D on: 12/20/2019 08:37 AM   Modules accepted: Orders

## 2019-12-21 DIAGNOSIS — J189 Pneumonia, unspecified organism: Secondary | ICD-10-CM | POA: Insufficient documentation

## 2019-12-21 DIAGNOSIS — J680 Bronchitis and pneumonitis due to chemicals, gases, fumes and vapors: Secondary | ICD-10-CM | POA: Insufficient documentation

## 2019-12-21 DIAGNOSIS — J984 Other disorders of lung: Secondary | ICD-10-CM | POA: Insufficient documentation

## 2019-12-21 DIAGNOSIS — I700 Atherosclerosis of aorta: Secondary | ICD-10-CM | POA: Insufficient documentation

## 2019-12-21 MED ORDER — AMOXICILLIN-POT CLAVULANATE 875-125 MG PO TABS: 1.0000 | ORAL_TABLET | Freq: Two times a day (BID) | ORAL | 0 refills | Status: DC

## 2019-12-21 NOTE — Addendum Note (Signed)
Addended by: Beatrice Lecher D on: 12/21/2019 04:26 PM   Modules accepted: Orders

## 2019-12-21 NOTE — Assessment & Plan Note (Signed)
CXR showed possible infiltrate. Started on [employer].  CT ordered bc of prolonged illness.Showed cavitating mass with spiculation in the right lung. Will refer to pulm ASAP. Augmentin added. He was willing to retry though on allergy list.

## 2019-12-24 ENCOUNTER — Encounter: Payer: Self-pay | Admitting: Family Medicine

## 2019-12-26 ENCOUNTER — Other Ambulatory Visit: Payer: Self-pay | Admitting: Family Medicine

## 2019-12-27 ENCOUNTER — Telehealth: Payer: Self-pay | Admitting: Pulmonary Disease

## 2019-12-27 NOTE — Telephone Encounter (Signed)
Patient is aware 

## 2019-12-27 NOTE — Telephone Encounter (Signed)
I spoke to Pulmonary they are going to send Dr. Valeta Harms a message to get patient worked in and have a nurse reach out to patient.  They stated they are still working on August referrals but will get this referral taken care of . - CF

## 2019-12-27 NOTE — Telephone Encounter (Signed)
PCCM   We received a referral for lung mass.  I have attempted to call the patient with the numbers available in the chart.   [phone]  I would like to get this patient scheduled for possible tissue diagnosis as soon as possible.   There was no answer and the phone line was unable to connect to number given in the chart.   CC: [name], DO   Garner Nash, DO [facility] 12/27/2019 3:15 PM

## 2019-12-30 NOTE — Telephone Encounter (Signed)
No please don't give out my cell.  We just need a working number to reach him and a time that he would be available.  I will try again this week to reach him   [facility] 12/30/2019 9:21 AM

## 2019-12-31 NOTE — Telephone Encounter (Signed)
Called spoke with patient at [phone] let him know Dr. Valeta Harms would be reaching out to him sometime this week. 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

## 2020-01-01 ENCOUNTER — Telehealth: Payer: Self-pay | Admitting: Pulmonary Disease

## 2020-01-01 DIAGNOSIS — R918 Other nonspecific abnormal finding of lung field: Secondary | ICD-10-CM

## 2020-01-01 NOTE — Telephone Encounter (Signed)
PCCM:  Former patient of Dr. Lenna Gilford.   I discussed bronchoscopy with the patient. We will try to get him scheduled as soon as possible. The patient is agreeable to proceed.   Likely planning for this Friday or next week on Tuesday with [place], DO [facility] 01/01/2020 4:17 PM

## 2020-01-02 ENCOUNTER — Other Ambulatory Visit: Payer: Self-pay | Admitting: Sports Medicine

## 2020-01-02 DIAGNOSIS — F411 Generalized anxiety disorder: Secondary | ICD-10-CM

## 2020-01-03 NOTE — Telephone Encounter (Signed)
Lets send these to patient's PCP.

## 2020-01-04 ENCOUNTER — Other Ambulatory Visit (HOSPITAL_COMMUNITY)
Admission: RE | Admit: 2020-01-04 | Discharge: 2020-01-04 | Disposition: A | Discharge status: Home / Self Care | Payer: BC Managed Care – PPO | Source: Ambulatory Visit | Attending: Emergency Medicine | Admitting: Emergency Medicine

## 2020-01-04 DIAGNOSIS — Z01818 Encounter for other preprocedural examination: Secondary | ICD-10-CM | POA: Diagnosis present

## 2020-01-04 DIAGNOSIS — Z20822 Contact with and (suspected) exposure to covid-19: Secondary | ICD-10-CM | POA: Diagnosis not present

## 2020-01-04 LAB — SARS CORONAVIRUS 2 (TAT 6-24 HRS): SARS Coronavirus 2: NEGATIVE

## 2020-01-06 ENCOUNTER — Other Ambulatory Visit: Payer: Self-pay

## 2020-01-06 ENCOUNTER — Encounter (HOSPITAL_COMMUNITY): Payer: Self-pay | Admitting: Emergency Medicine

## 2020-01-06 NOTE — Progress Notes (Signed)
Spoke with pt for pre-op call. Pt denies cardiac disease, is treated for HTN. Pt states he is not diabetic.  Covid test done on 01/04/20 and it's negative. Pt states he has been in quarantine since the test was done and will stay in it until he comes to the hospital on Tuesday.

## 2020-01-07 ENCOUNTER — Ambulatory Visit (HOSPITAL_COMMUNITY): Payer: BC Managed Care – PPO

## 2020-01-07 ENCOUNTER — Other Ambulatory Visit: Payer: Self-pay

## 2020-01-07 ENCOUNTER — Ambulatory Visit (HOSPITAL_COMMUNITY): Payer: BC Managed Care – PPO | Admitting: Certified Registered Nurse Anesthetist

## 2020-01-07 ENCOUNTER — Encounter (HOSPITAL_COMMUNITY): Payer: Self-pay | Admitting: Emergency Medicine

## 2020-01-07 ENCOUNTER — Encounter (HOSPITAL_COMMUNITY)
Admission: RE | Disposition: A | Discharge status: Home / Self Care | Payer: Self-pay | Source: Home / Self Care | Attending: Emergency Medicine

## 2020-01-07 ENCOUNTER — Ambulatory Visit (HOSPITAL_COMMUNITY)
Admission: RE | Admit: 2020-01-07 | Discharge: 2020-01-07 | Disposition: A | Discharge status: Home / Self Care | Payer: BC Managed Care – PPO | Attending: Emergency Medicine | Admitting: Emergency Medicine

## 2020-01-07 DIAGNOSIS — J441 Chronic obstructive pulmonary disease with (acute) exacerbation: Secondary | ICD-10-CM | POA: Diagnosis not present

## 2020-01-07 DIAGNOSIS — R599 Enlarged lymph nodes, unspecified: Secondary | ICD-10-CM | POA: Insufficient documentation

## 2020-01-07 DIAGNOSIS — K219 Gastro-esophageal reflux disease without esophagitis: Secondary | ICD-10-CM | POA: Diagnosis not present

## 2020-01-07 DIAGNOSIS — J329 Chronic sinusitis, unspecified: Secondary | ICD-10-CM | POA: Diagnosis not present

## 2020-01-07 DIAGNOSIS — I10 Essential (primary) hypertension: Secondary | ICD-10-CM | POA: Diagnosis not present

## 2020-01-07 DIAGNOSIS — Z87891 Personal history of nicotine dependence: Secondary | ICD-10-CM | POA: Diagnosis not present

## 2020-01-07 DIAGNOSIS — C3411 Malignant neoplasm of upper lobe, right bronchus or lung: Secondary | ICD-10-CM | POA: Insufficient documentation

## 2020-01-07 DIAGNOSIS — Z9889 Other specified postprocedural states: Secondary | ICD-10-CM

## 2020-01-07 DIAGNOSIS — R911 Solitary pulmonary nodule: Secondary | ICD-10-CM

## 2020-01-07 DIAGNOSIS — R918 Other nonspecific abnormal finding of lung field: Secondary | ICD-10-CM | POA: Diagnosis present

## 2020-01-07 HISTORY — PX: VIDEO BRONCHOSCOPY WITH ENDOBRONCHIAL NAVIGATION: SHX6175

## 2020-01-07 HISTORY — PX: VIDEO BRONCHOSCOPY WITH ENDOBRONCHIAL ULTRASOUND: SHX6177

## 2020-01-07 HISTORY — DX: Personal history of urinary calculi: Z87.442

## 2020-01-07 HISTORY — PX: BRONCHIAL BRUSHINGS: SHX5108

## 2020-01-07 HISTORY — PX: HEMOSTASIS CONTROL: SHX6838

## 2020-01-07 HISTORY — DX: Unspecified asthma, uncomplicated: J45.909

## 2020-01-07 HISTORY — DX: Essential (primary) hypertension: I10

## 2020-01-07 HISTORY — PX: BRONCHIAL WASHINGS: SHX5105

## 2020-01-07 HISTORY — PX: BRONCHIAL BIOPSY: SHX5109

## 2020-01-07 HISTORY — PX: BRONCHIAL NEEDLE ASPIRATION BIOPSY: SHX5106

## 2020-01-07 LAB — PROTIME-INR
INR: 1.1 (ref 0.8–1.2)
Prothrombin Time: 13.5 seconds (ref 11.4–15.2)

## 2020-01-07 LAB — COMPREHENSIVE METABOLIC PANEL
ALT: 13 U/L (ref 0–44)
AST: 16 U/L (ref 15–41)
Albumin: 3.6 g/dL (ref 3.5–5.0)
Alkaline Phosphatase: 49 U/L (ref 38–126)
Anion gap: 11 (ref 5–15)
BUN: 12 mg/dL (ref 6–20)
CO2: 24 mmol/L (ref 22–32)
Calcium: 8.9 mg/dL (ref 8.9–10.3)
Chloride: 101 mmol/L (ref 98–111)
Creatinine, Ser: 1.06 mg/dL (ref 0.61–1.24)
GFR calc Af Amer: >60 mL/min (ref >60)
GFR calc non Af Amer: >60 mL/min (ref >60)
Glucose, Bld: 87 mg/dL (ref 70–99)
Potassium: 3.5 mmol/L (ref 3.5–5.1)
Sodium: 136 mmol/L (ref 135–145)
Total Bilirubin: 0.4 mg/dL (ref 0.3–1.2)
Total Protein: 6.2 g/dL — ABNORMAL LOW (ref 6.5–8.1)

## 2020-01-07 LAB — CBC
HCT: 38.8 % — ABNORMAL LOW (ref 39.0–52.0)
Hemoglobin: 13.2 g/dL (ref 13.0–17.0)
MCH: 30.1 pg (ref 26.0–34.0)
MCHC: 34 g/dL (ref 30.0–36.0)
MCV: 88.6 fL (ref 80.0–100.0)
Platelets: 322 10*3/uL (ref 150–400)
RBC: 4.38 MIL/uL (ref 4.22–5.81)
RDW: 13.2 % (ref 11.5–15.5)
WBC: 6.3 10*3/uL (ref 4.0–10.5)
nRBC: 0 % (ref 0.0–0.2)

## 2020-01-07 LAB — APTT: aPTT: 26 seconds (ref 24–36)

## 2020-01-07 IMAGING — DX DG CHEST 1V PORT
2 series · 2 of 2 positions shown · non-contrast
Comparison: Chest CT [DATE] and earlier.

CLINICAL DATA: 59-year-old male status post bronchoscopy,
endobronchial ultrasound and biopsy of cavitary right upper lobe
perihilar mass.

EXAM:
PORTABLE CHEST 1 VIEW

[chest ap (1 of 2)]
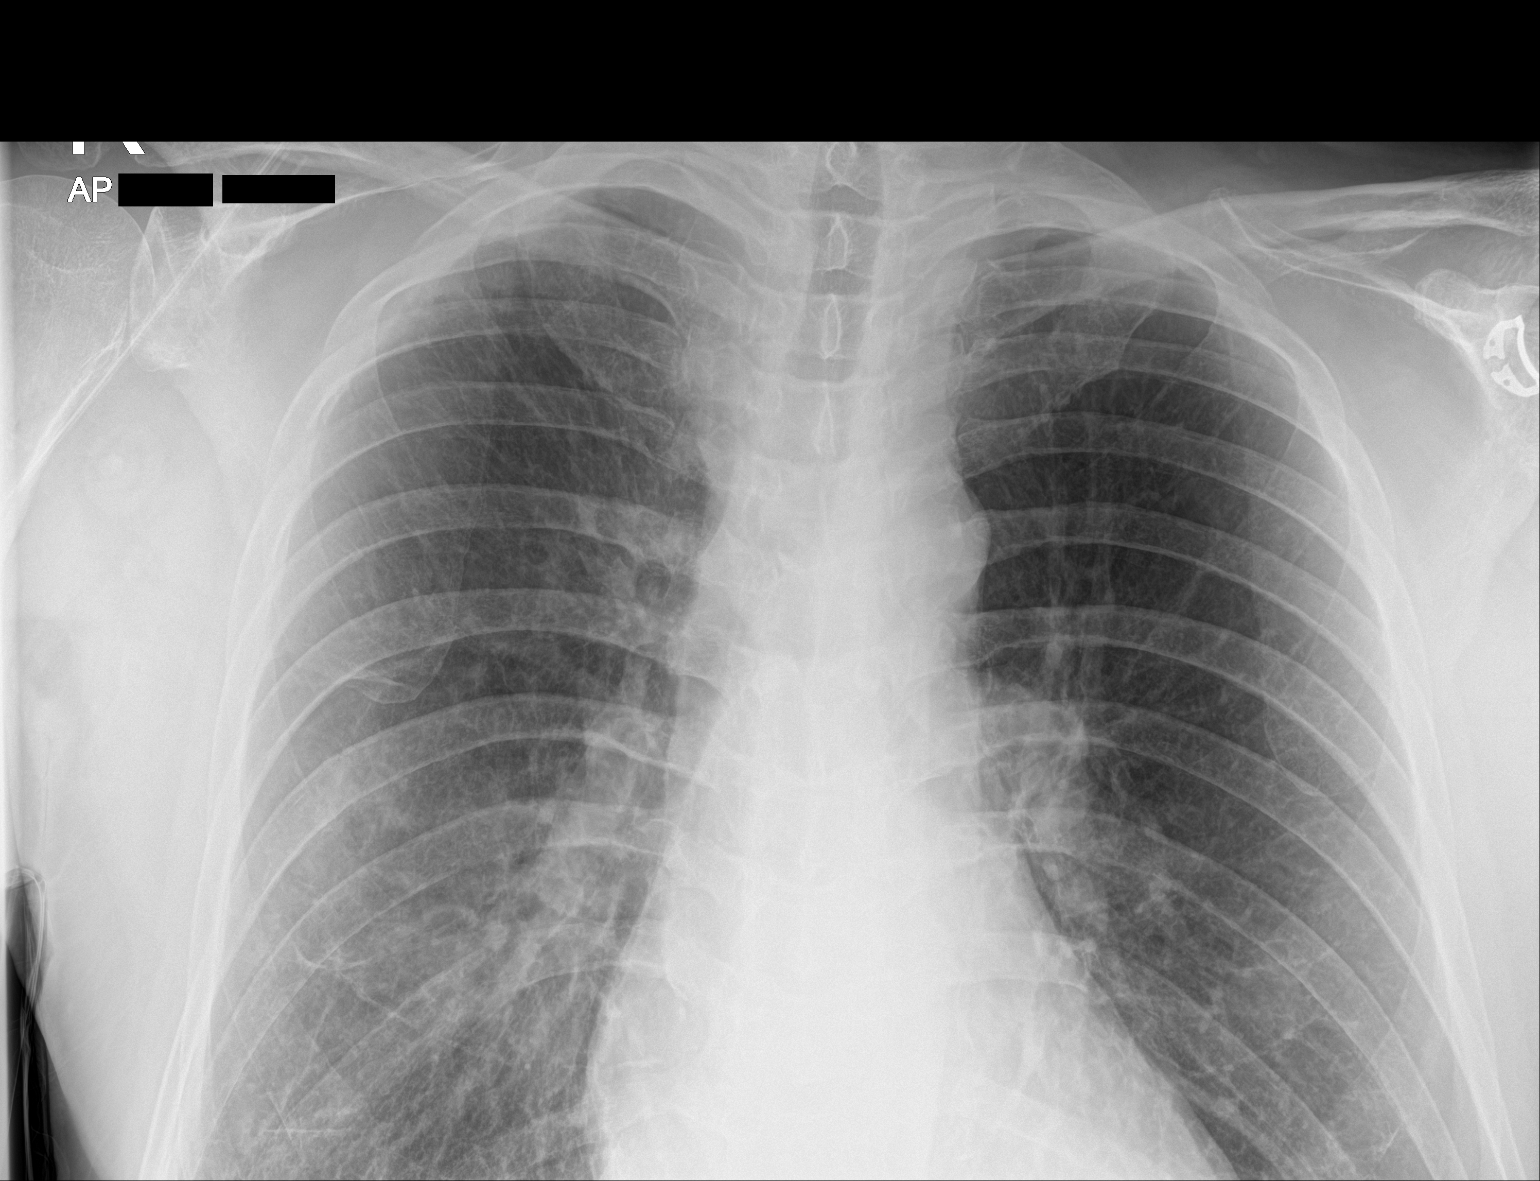

[chest ap (2 of 2)]
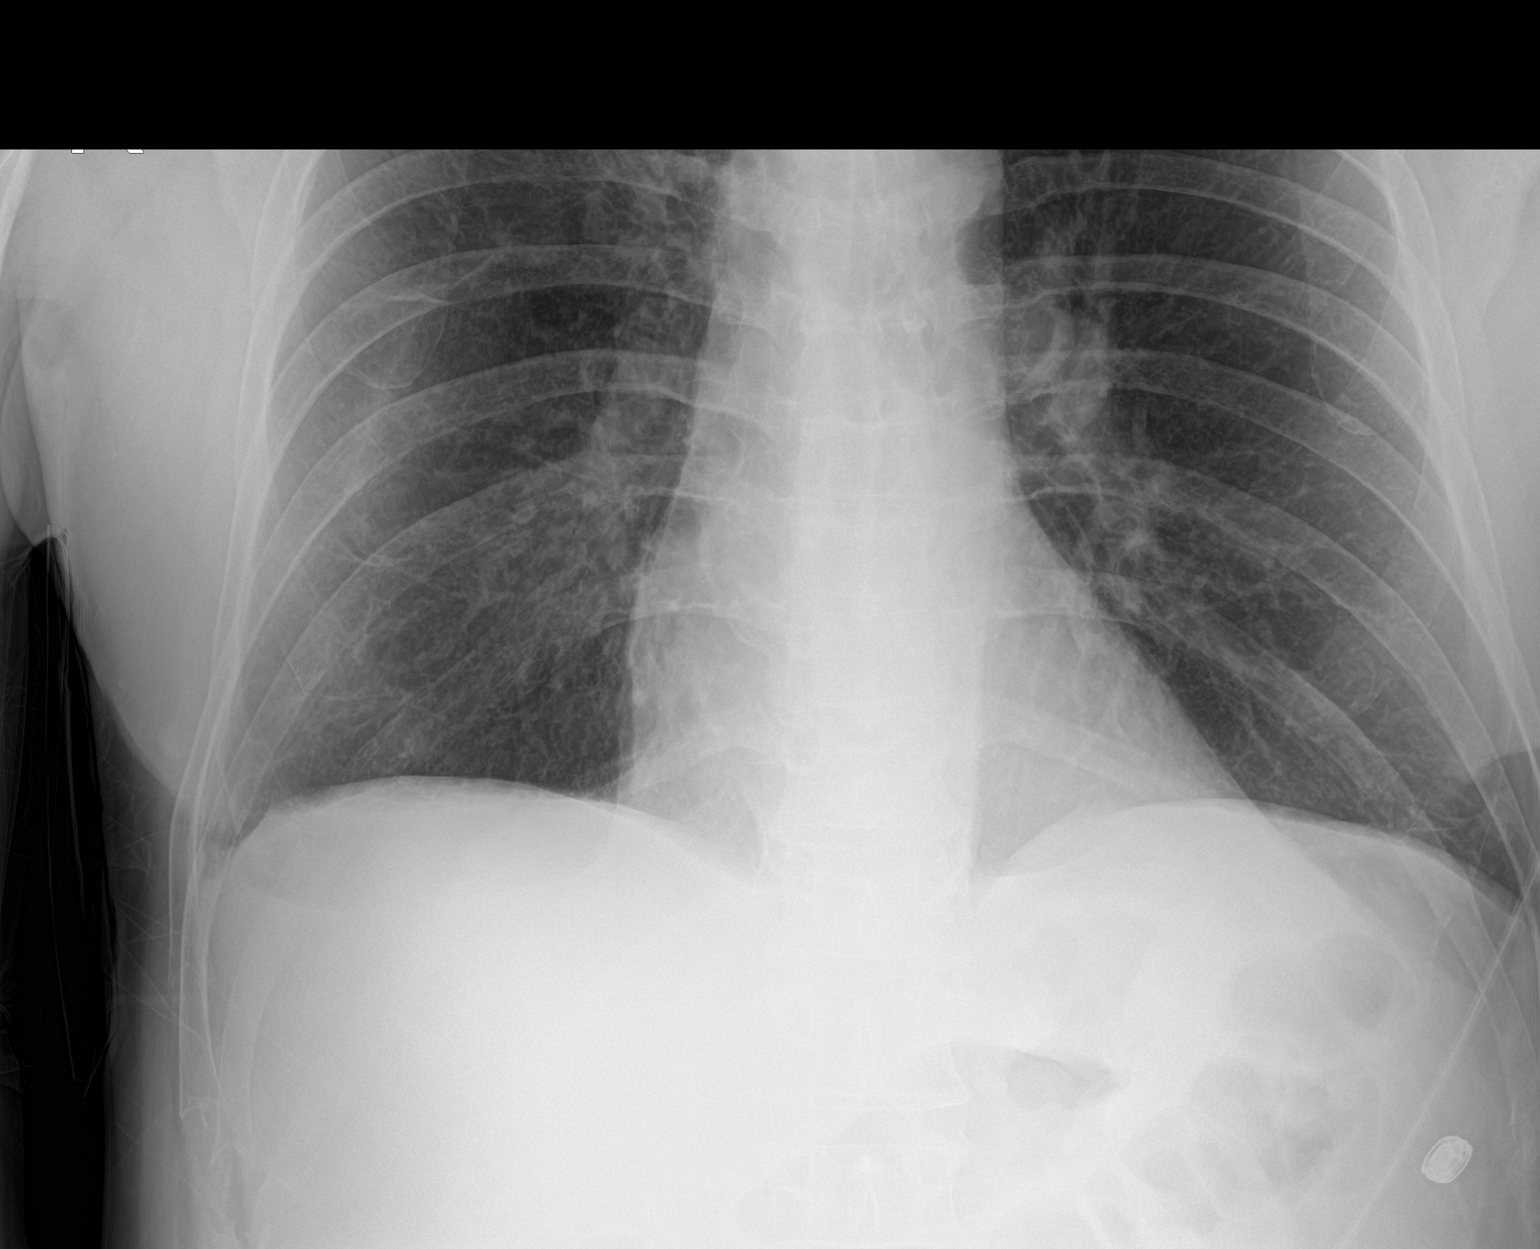

[2 of 2 positions shown; findings below may reference images not displayed]

FINDINGS: Portable AP upright view at [3F] hours. Stable lung volumes and
mediastinal contours. No pneumothorax or pleural effusion. The left
lung appears stable. There is vague new veiling perihilar density in
the right lung, which could be sequelae of bronchoalveolar lavage or
similar. Otherwise stable right perihilar and lung markings.
Visualized tracheal air column is within normal limits. Negative
visible bowel gas pattern. No acute osseous abnormality identified.
IMPRESSION: No pneumothorax or adverse features status post bronchoscopy,
bronchoscopic biopsy.

## 2020-01-07 SURGERY — BRONCHOSCOPY, WITH EBUS
Anesthesia: General

## 2020-01-07 MED ORDER — ONDANSETRON HCL 4 MG/2ML IJ SOLN
INTRAMUSCULAR | Status: DC | PRN
  Administered 2020-01-07: 4 mg via INTRAVENOUS

## 2020-01-07 MED ORDER — DEXAMETHASONE SODIUM PHOSPHATE 10 MG/ML IJ SOLN
INTRAMUSCULAR | Status: DC | PRN
  Administered 2020-01-07: 10 mg via INTRAVENOUS

## 2020-01-07 MED ORDER — MECLIZINE HCL 25 MG PO TABS: 25.0000 mg | ORAL_TABLET | Freq: Four times a day (QID) | ORAL | Status: DC | PRN

## 2020-01-07 MED ORDER — LIDOCAINE 2% (20 MG/ML) 5 ML SYRINGE
INTRAMUSCULAR | Status: DC | PRN
  Administered 2020-01-07: 80 mg via INTRAVENOUS

## 2020-01-07 MED ORDER — PHENYLEPHRINE HCL-NACL 10-0.9 MG/250ML-% IV SOLN
INTRAVENOUS | Status: DC | PRN
  Administered 2020-01-07: 25 ug/min via INTRAVENOUS

## 2020-01-07 MED ORDER — TRAZODONE HCL 50 MG PO TABS: 50.0000 mg | ORAL_TABLET | Freq: Every evening | ORAL | Status: DC | PRN

## 2020-01-07 MED ORDER — MIDAZOLAM HCL 2 MG/2ML IJ SOLN
INTRAMUSCULAR | Status: DC | PRN
  Administered 2020-01-07: 2 mg via INTRAVENOUS

## 2020-01-07 MED ORDER — SUGAMMADEX SODIUM 200 MG/2ML IV SOLN
INTRAVENOUS | Status: DC | PRN
  Administered 2020-01-07: 200 mg via INTRAVENOUS

## 2020-01-07 MED ORDER — LACTATED RINGERS IV SOLN: INTRAVENOUS | Status: DC

## 2020-01-07 MED ORDER — PROPOFOL 10 MG/ML IV BOLUS
INTRAVENOUS | Status: DC | PRN
  Administered 2020-01-07: 200 mg via INTRAVENOUS

## 2020-01-07 MED ORDER — ROCURONIUM BROMIDE 10 MG/ML (PF) SYRINGE
PREFILLED_SYRINGE | INTRAVENOUS | Status: DC | PRN
  Administered 2020-01-07: 80 mg via INTRAVENOUS

## 2020-01-07 MED ORDER — FENTANYL CITRATE (PF) 250 MCG/5ML IJ SOLN
INTRAMUSCULAR | Status: DC | PRN
Start: 2020-01-07 — End: 2020-01-07
  Administered 2020-01-07 (×2): 50 ug via INTRAVENOUS

## 2020-01-07 MED ORDER — CHLORHEXIDINE GLUCONATE 0.12 % MT SOLN
OROMUCOSAL | Status: AC
  Administered 2020-01-07: 15 mL
  Filled 2020-01-07: qty 15

## 2020-01-07 NOTE — Discharge Instructions (Signed)
Flexible Bronchoscopy, Care After This sheet gives you information about how to care for yourself after your test. Your doctor may also give you more specific instructions. If you have problems or questions, contact your doctor. Follow these instructions at home: Eating and drinking  Do not eat or drink anything (not even water) for 2 hours after your test, or until your numbing medicine (local anesthetic) wears off.  When your numbness is gone and your cough and gag reflexes have come back, you may: ? Eat only soft foods. ? Slowly drink liquids.  The day after the test, go back to your normal diet. Driving  Do not drive for 24 hours if you were given a medicine to help you relax (sedative).  Do not drive or use heavy machinery while taking prescription pain medicine. General instructions   Take over-the-counter and prescription medicines only as told by your doctor.  Return to your normal activities as told. Ask what activities are safe for you.  Do not use any products that have nicotine or tobacco in them. This includes cigarettes and e-cigarettes. If you need help quitting, ask your doctor.  Keep all follow-up visits as told by your doctor. This is important. It is very important if you had a tissue sample (biopsy) taken. Get help right away if:  You have shortness of breath that gets worse.  You get light-headed.  You feel like you are going to pass out (faint).  You have chest pain.  You cough up: ? More than a little blood. ? More blood than before. Summary  Do not eat or drink anything (not even water) for 2 hours after your test, or until your numbing medicine wears off.  Do not use cigarettes. Do not use e-cigarettes.  Get help right away if you have chest pain.  Please call our office for any questions or concerns.  [phone]..    This information is not intended to replace advice given to you by your health care provider. Make sure you discuss any  questions you have with your health care provider. Document Revised: 03/24/2017 Document Reviewed: 04/29/2016 Elsevier Patient Education  2020 [employer].

## 2020-01-07 NOTE — Op Note (Signed)
Video Bronchoscopy with Endobronchial Ultrasound and Electromagnetic Navigation Procedure Note  Date of Operation: 01/07/2020  Pre-op Diagnosis: Right upper lobe mass, pulmonary nodules, mediastinal adenopathy  Post-op Diagnosis: Same  Surgeon: Baltazar Apo  Assistants: None  Anesthesia: General endotracheal anesthesia  Operation: Flexible video fiberoptic bronchoscopy with endobronchial ultrasound and biopsies.  Estimated Blood Loss: 25 cc  Complications: None apparent  Indications and History: Richard Li is a 59 y.o. male with history tobacco, COPD.  He underwent chest x-ray and then CT scan for cough and was found to have a cavitary right upper lobe perihilar mass, scattered pulmonary nodules including a groundglass nodule in the right middle lobe, enlargement of subcarinal lymph node.  Recommendation was made to achieve a tissue diagnosis via navigational bronchoscopy and endobronchial ultrasound.  The risks, benefits, complications, treatment options and expected outcomes were discussed with the patient.  The possibilities of pneumothorax, pneumonia, reaction to medication, pulmonary aspiration, perforation of a viscus, bleeding, failure to diagnose a condition and creating a complication requiring transfusion or operation were discussed with the patient who freely signed the consent.    Description of Procedure: The patient was examined in the preoperative area and history and data from the preprocedure consultation were reviewed. It was deemed appropriate to proceed.  The patient was taken to [facility] endoscopy room 2, identified as [name] and the procedure verified as Flexible Video Fiberoptic Bronchoscopy.  A Time Out was held and the above information confirmed. After being taken to the operating room general anesthesia was initiated and the patient  was orally intubated. The video fiberoptic bronchoscope was introduced via the endotracheal tube and a general  inspection was performed which showed sharp main carina, normal left-sided airways.  The right upper lobe bronchus and the proximal right mainstem were edematous, irregular, hypervascular.  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  Endobronchial brushings and biopsies were performed in the right upper lobe bronchus. The standard scope was then withdrawn and the endobronchial ultrasound was used to identify and characterize the peritracheal, hilar and bronchial lymph nodes. Inspection showed enlargement at station 7.  Inspection also identified an irregularly shaped mixed density mass just proximal to the right upper lobe takeoff with associated vascularity.  Using real-time ultrasound guidance Wang needle biopsies were take from Station 7 node as well as the right upper lobe mass and were sent for cytology.   Prior to the date of the procedure a high-resolution CT scan of the chest was performed. Utilizing [place] a virtual tracheobronchial tree was generated to allow the creation of distinct navigation pathways to the patient's parenchymal abnormalities.  The standard scope was reintroduced. The extendable working channel and locator guide were introduced into the bronchoscope. The distinct navigation pathway prepared prior to this procedure were then utilized to navigate to within 0.3 cm of patient's right middle lobe groundglass nodule identified on CT scan. The extendable working channel was secured into place and the locator guide was withdrawn. Under fluoroscopic guidance transbronchial needle brushings, transbronchial Wang needle biopsies, and transbronchial forceps biopsies were performed to be sent for cytology and pathology. A bronchioalveolar lavage was performed in the right middle lobe and sent for cytology and microbiology (bacterial, fungal, AFB smears and cultures).  None of the other small nodules were accessible  by navigation.  At the end of the procedure a general airway inspection was performed and there was no evidence of active bleeding. The bronchoscope was  removed.  The patient tolerated the procedure well. There was no significant blood loss and there were no obvious complications. A post-procedural chest x-ray is pending.  The patient tolerated the procedure well without apparent complications. There was no significant blood loss. The bronchoscope was withdrawn. Anesthesia was reversed and the patient was taken to the PACU for recovery.   Samples: 1. Endobronchial biopsies from right upper lobe bronchus 2.  Endobronchial brushings from right upper lobe bronchus 3.  Wang needle biopsy of right upper lobe bronchus mass (via EBUS) 4. Wang needle biopsies from 7 node 5. Transbronchial needle brushings from right middle lobe nodule 6. Transbronchial Wang needle biopsies from right middle lobe nodule 7. Transbronchial forceps biopsies from right middle lobe nodule 8. Bronchoalveolar lavage from right middle lobe  Plans:  The patient will be discharged from the PACU to home when recovered from anesthesia and after chest x-ray is reviewed. We will review the cytology, pathology and microbiology results with the patient when they become available. Outpatient followup will be with Dr Lamonte Sakai or Dr Valeta Harms  Baltazar Apo, MD, PhD 01/07/2020, 3:09 PM [facility] [phone] or if no answer [phone]

## 2020-01-07 NOTE — Progress Notes (Signed)
Handoff given to Janet Berlin, CRNA

## 2020-01-07 NOTE — Transfer of Care (Signed)
Immediate Anesthesia Transfer of Care Note  Patient: Richard Li  Procedure(s) Performed: VIDEO BRONCHOSCOPY WITH ENDOBRONCHIAL ULTRASOUND (N/A ) VIDEO BRONCHOSCOPY WITH ENDOBRONCHIAL NAVIGATION (N/A ) BRONCHIAL NEEDLE ASPIRATION BIOPSIES BRONCHIAL BIOPSIES BRONCHIAL BRUSHINGS HEMOSTASIS CONTROL BRONCHIAL WASHINGS  Patient Location: PACU  Anesthesia Type:General  Level of Consciousness: awake, alert  and oriented  Airway & Oxygen Therapy: Patient Spontanous Breathing and Patient connected to face mask oxygen  Post-op Assessment: Report given to RN and Post -op Vital signs reviewed and stable  Post vital signs: Reviewed and stable  Last Vitals:  Vitals Value Taken Time  BP 131/75 01/07/20 1508  Temp 36.4 C 01/07/20 1508  Pulse 66 01/07/20 1515  Resp 14 01/07/20 1515  SpO2 100 % 01/07/20 1515  Vitals shown include unvalidated device data.  Last Pain:  Vitals:   01/07/20 1031  TempSrc:   PainSc: 2       Patients Stated Pain Goal: 2 (44/03/47 4259)  Complications: No complications documented.

## 2020-01-07 NOTE — Anesthesia Preprocedure Evaluation (Addendum)
Anesthesia Evaluation  Patient identified by MRN, date of birth, ID band Patient awake    Reviewed: Patient's Chart, lab work & pertinent test results  Airway Mallampati: II  TM Distance: >3 FB Neck ROM: Full    Dental  (+) Teeth Intact   Pulmonary COPD, former smoker,    Pulmonary exam normal        Cardiovascular hypertension, Pt. on medications  Rhythm:Regular Rate:Normal     Neuro/Psych Anxiety negative neurological ROS     GI/Hepatic Neg liver ROS, GERD  Medicated,  Endo/Other  negative endocrine ROS  Renal/GU Renal diseaseH/o renal calculi  negative genitourinary   Musculoskeletal negative musculoskeletal ROS (+)   Abdominal Normal abdominal exam  (+)  Abdomen: soft. Bowel sounds: normal.  Peds negative pediatric ROS (+)  Hematology negative hematology ROS (+)   Anesthesia Other Findings   Reproductive/Obstetrics negative OB ROS                            Anesthesia Physical Anesthesia Plan  ASA: II  Anesthesia Plan: General   Post-op Pain Management:    Induction: Intravenous  PONV Risk Score and Plan: Ondansetron and Dexamethasone  Airway Management Planned: Mask and Oral ETT  Additional Equipment: None  Intra-op Plan:   Post-operative Plan: Extubation in OR  Informed Consent: I have reviewed the patients History and Physical, chart, labs and discussed the procedure including the risks, benefits and alternatives for the proposed anesthesia with the patient or authorized representative who has indicated his/her understanding and acceptance.       Plan Discussed with: CRNA and Surgeon  Anesthesia Plan Comments: (Lab Results      Component                Value               Date                      WBC                      8.3                 11/06/2019                HGB                      13.6                11/06/2019                HCT                       40.0                11/06/2019                MCV                      89.7                11/06/2019                PLT                      346  11/06/2019           Covid-19 Nucleic Acid Test Results Lab Results      Component                Value               Date                      [id number]              NEGATIVE            01/04/2020          )        Anesthesia Quick Evaluation

## 2020-01-07 NOTE — Interval H&P Note (Signed)
History and Physical Interval Note:  01/07/2020 12:50 PM  Richard Li  has presented today for surgery, with the diagnosis of LUNG MASS.  The various methods of treatment have been discussed with the patient and family. After consideration of risks, benefits and other options for treatment, the patient has consented to  Procedure(s): VIDEO BRONCHOSCOPY WITH ENDOBRONCHIAL ULTRASOUND (N/A) VIDEO BRONCHOSCOPY WITH ENDOBRONCHIAL NAVIGATION (N/A) as a surgical intervention.  The patient's history has been reviewed, patient examined, no change in status, stable for surgery.  I have reviewed the patient's chart and labs.  Questions were answered to the patient's satisfaction.     Collene Gobble

## 2020-01-07 NOTE — Anesthesia Postprocedure Evaluation (Signed)
Anesthesia Post Note  Patient: Richard Li  Procedure(s) Performed: VIDEO BRONCHOSCOPY WITH ENDOBRONCHIAL ULTRASOUND (N/A ) VIDEO BRONCHOSCOPY WITH ENDOBRONCHIAL NAVIGATION (N/A ) BRONCHIAL NEEDLE ASPIRATION BIOPSIES BRONCHIAL BIOPSIES BRONCHIAL BRUSHINGS HEMOSTASIS CONTROL BRONCHIAL WASHINGS     Patient location during evaluation: PACU Anesthesia Type: General Level of consciousness: awake and alert Pain management: pain level controlled Vital Signs Assessment: post-procedure vital signs reviewed and stable Respiratory status: spontaneous breathing, nonlabored ventilation, respiratory function stable and patient connected to nasal cannula oxygen Cardiovascular status: blood pressure returned to baseline and stable Postop Assessment: no apparent nausea or vomiting Anesthetic complications: no   No complications documented.  Last Vitals:  Vitals:   01/07/20 1553 01/07/20 1555  BP: (!) 144/59 (!) 144/59  Pulse: (!) 50 (!) 51  Resp: 16 15  Temp:    SpO2: 99% 99%    Last Pain:  Vitals:   01/07/20 1553  TempSrc:   PainSc: 0-No pain                 Belenda Cruise P Antonietta Lansdowne

## 2020-01-07 NOTE — Anesthesia Procedure Notes (Signed)
Procedure Name: Intubation Date/Time: 01/07/2020 1:14 PM Performed by: Alain Marion, CRNA Pre-anesthesia Checklist: Patient identified, Emergency Drugs available, Suction available and Patient being monitored Patient Re-evaluated:Patient Re-evaluated prior to induction Oxygen Delivery Method: Circle System Utilized Preoxygenation: Pre-oxygenation with 100% oxygen Induction Type: IV induction Ventilation: Mask ventilation without difficulty Laryngoscope Size: Glidescope and 4 Grade View: Grade I Tube type: Oral Number of attempts: 1 Airway Equipment and Method: Stylet and Video-laryngoscopy Placement Confirmation: ETT inserted through vocal cords under direct vision,  positive ETCO2 and breath sounds checked- equal and bilateral Secured at: 22 cm Tube secured with: Tape Dental Injury: Teeth and Oropharynx as per pre-operative assessment  Comments: DVL x 1 with poor view, transitioned to glide 4 with success

## 2020-01-08 LAB — ACID FAST SMEAR (AFB, MYCOBACTERIA): Acid Fast Smear: NEGATIVE

## 2020-01-09 ENCOUNTER — Encounter (HOSPITAL_COMMUNITY): Payer: Self-pay | Admitting: Emergency Medicine

## 2020-01-09 LAB — CULTURE, RESPIRATORY W GRAM STAIN: Culture: NO GROWTH

## 2020-01-09 LAB — CYTOLOGY - NON PAP

## 2020-01-10 ENCOUNTER — Telehealth: Payer: Self-pay | Admitting: Emergency Medicine

## 2020-01-10 DIAGNOSIS — C3491 Malignant neoplasm of unspecified part of right bronchus or lung: Secondary | ICD-10-CM

## 2020-01-10 NOTE — Telephone Encounter (Signed)
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

## 2020-01-10 NOTE — Telephone Encounter (Signed)
Discussed with him

## 2020-01-10 NOTE — Telephone Encounter (Signed)
Pt sent mychart message in regards to the chest xray he had done after the bronch. Pt had seen results on his mychart but when he went to click on it, the results were gone.  Pt is requesting to know the results of the xray that was performed after the bronch. Dr. Lamonte Sakai, please advise.

## 2020-01-13 ENCOUNTER — Encounter: Payer: Self-pay | Admitting: Internal Medicine

## 2020-01-13 ENCOUNTER — Telehealth: Payer: Self-pay | Admitting: Internal Medicine

## 2020-01-13 LAB — CYTOLOGY - NON PAP

## 2020-01-13 LAB — SURGICAL PATHOLOGY

## 2020-01-13 NOTE — Telephone Encounter (Signed)
Scheduled per 9/20 referral msg. Called and spoke with pt, confirmed 9/24 appts. Pt made aware to arrive 4mins before appt time and to also bring photo ID and insurance cards

## 2020-01-16 ENCOUNTER — Other Ambulatory Visit: Payer: Self-pay

## 2020-01-16 ENCOUNTER — Other Ambulatory Visit: Payer: Self-pay | Admitting: *Deleted

## 2020-01-16 ENCOUNTER — Encounter (HOSPITAL_COMMUNITY)
Admission: RE | Admit: 2020-01-16 | Discharge: 2020-01-16 | Disposition: A | Discharge status: Home / Self Care | Payer: BC Managed Care – PPO | Source: Ambulatory Visit | Attending: Emergency Medicine | Admitting: Emergency Medicine

## 2020-01-16 DIAGNOSIS — C3491 Malignant neoplasm of unspecified part of right bronchus or lung: Secondary | ICD-10-CM | POA: Insufficient documentation

## 2020-01-16 DIAGNOSIS — J984 Other disorders of lung: Secondary | ICD-10-CM

## 2020-01-16 LAB — GLUCOSE, CAPILLARY: Glucose-Capillary: 95 mg/dL (ref 70–99)

## 2020-01-16 MED ORDER — FLUDEOXYGLUCOSE F - 18 (FDG) INJECTION
9.7700 | Freq: Once | INTRAVENOUS | Status: AC | PRN
  Administered 2020-01-16: 9.77 via INTRAVENOUS

## 2020-01-17 ENCOUNTER — Inpatient Hospital Stay: Payer: BC Managed Care – PPO

## 2020-01-17 ENCOUNTER — Other Ambulatory Visit: Payer: Self-pay

## 2020-01-17 ENCOUNTER — Inpatient Hospital Stay: Payer: BC Managed Care – PPO | Attending: Internal Medicine | Admitting: Internal Medicine

## 2020-01-17 ENCOUNTER — Encounter: Payer: Self-pay | Admitting: Internal Medicine

## 2020-01-17 VITALS — BP 129/66 | HR 74 | Temp 98.3°F | Resp 17 | Ht 71.0 in | Wt 197.6 lb

## 2020-01-17 DIAGNOSIS — Z79899 Other long term (current) drug therapy: Secondary | ICD-10-CM | POA: Diagnosis not present

## 2020-01-17 DIAGNOSIS — R5383 Other fatigue: Secondary | ICD-10-CM | POA: Diagnosis not present

## 2020-01-17 DIAGNOSIS — C3491 Malignant neoplasm of unspecified part of right bronchus or lung: Secondary | ICD-10-CM | POA: Insufficient documentation

## 2020-01-17 DIAGNOSIS — Z87891 Personal history of nicotine dependence: Secondary | ICD-10-CM | POA: Diagnosis not present

## 2020-01-17 DIAGNOSIS — I10 Essential (primary) hypertension: Secondary | ICD-10-CM | POA: Insufficient documentation

## 2020-01-17 DIAGNOSIS — C3490 Malignant neoplasm of unspecified part of unspecified bronchus or lung: Secondary | ICD-10-CM | POA: Diagnosis not present

## 2020-01-17 DIAGNOSIS — K219 Gastro-esophageal reflux disease without esophagitis: Secondary | ICD-10-CM | POA: Diagnosis not present

## 2020-01-17 DIAGNOSIS — Z9049 Acquired absence of other specified parts of digestive tract: Secondary | ICD-10-CM | POA: Insufficient documentation

## 2020-01-17 DIAGNOSIS — J984 Other disorders of lung: Secondary | ICD-10-CM

## 2020-01-17 DIAGNOSIS — Z791 Long term (current) use of non-steroidal anti-inflammatories (NSAID): Secondary | ICD-10-CM | POA: Insufficient documentation

## 2020-01-17 DIAGNOSIS — J449 Chronic obstructive pulmonary disease, unspecified: Secondary | ICD-10-CM | POA: Diagnosis not present

## 2020-01-17 DIAGNOSIS — R0989 Other specified symptoms and signs involving the circulatory and respiratory systems: Secondary | ICD-10-CM | POA: Insufficient documentation

## 2020-01-17 DIAGNOSIS — E785 Hyperlipidemia, unspecified: Secondary | ICD-10-CM | POA: Diagnosis not present

## 2020-01-17 LAB — CBC WITH DIFFERENTIAL (CANCER CENTER ONLY)
Abs Immature Granulocytes: 0.02 10*3/uL (ref 0.00–0.07)
Basophils Absolute: 0 10*3/uL (ref 0.0–0.1)
Basophils Relative: 1 %
Eosinophils Absolute: 0.1 10*3/uL (ref 0.0–0.5)
Eosinophils Relative: 1 %
HCT: 40.1 % (ref 39.0–52.0)
Hemoglobin: 13.8 g/dL (ref 13.0–17.0)
Immature Granulocytes: 0 %
Lymphocytes Relative: 34 %
Lymphs Abs: 2.4 10*3/uL (ref 0.7–4.0)
MCH: 30.1 pg (ref 26.0–34.0)
MCHC: 34.4 g/dL (ref 30.0–36.0)
MCV: 87.6 fL (ref 80.0–100.0)
Monocytes Absolute: 0.9 10*3/uL (ref 0.1–1.0)
Monocytes Relative: 12 %
Neutro Abs: 3.7 10*3/uL (ref 1.7–7.7)
Neutrophils Relative %: 52 %
Platelet Count: 312 10*3/uL (ref 150–400)
RBC: 4.58 MIL/uL (ref 4.22–5.81)
RDW: 13.3 % (ref 11.5–15.5)
WBC Count: 7.1 10*3/uL (ref 4.0–10.5)
nRBC: 0 % (ref 0.0–0.2)

## 2020-01-17 LAB — CMP (CANCER CENTER ONLY)
ALT: 12 U/L (ref 0–44)
AST: 14 U/L — ABNORMAL LOW (ref 15–41)
Albumin: 3.8 g/dL (ref 3.5–5.0)
Alkaline Phosphatase: 61 U/L (ref 38–126)
Anion gap: 7 (ref 5–15)
BUN: 17 mg/dL (ref 6–20)
CO2: 29 mmol/L (ref 22–32)
Calcium: 9.1 mg/dL (ref 8.9–10.3)
Chloride: 101 mmol/L (ref 98–111)
Creatinine: 0.96 mg/dL (ref 0.61–1.24)
GFR, Est AFR Am: >60 mL/min (ref >60)
GFR, Estimated: >60 mL/min (ref >60)
Glucose, Bld: 87 mg/dL (ref 70–99)
Potassium: 4 mmol/L (ref 3.5–5.1)
Sodium: 137 mmol/L (ref 135–145)
Total Bilirubin: 0.9 mg/dL (ref 0.3–1.2)
Total Protein: 6.9 g/dL (ref 6.5–8.1)

## 2020-01-17 NOTE — Progress Notes (Signed)
[place] [phone]   [phone]  CONSULT NOTE  REFERRING PHYSICIAN: Dr. Baltazar Apo  REASON FOR CONSULTATION:  59 years old white male recently diagnosed with lung cancer.  HPI Richard Li is a 59 y.o. male with past medical history significant for asthma, arthritis, COPD, hypertension as well as history of heavy smoking but quit 14 years ago.  The patient mentions that for several weeks has been complaining of chest congestion and sinusitis.  He was seen by his primary care physician and treated with several courses of antibiotics with no improvement.  Chest x-ray was performed on 12/19/2019 and it showed artifact versus developing atypical infiltrate in the right midlung field.  This was followed by CT scan of the chest without contrast on 12/20/2019 and it showed central right perihilar cavitary lesion span the fissure but primarily centered in the right upper lobe measuring 3.2 x 2.7 x 2.5 cm.  The lesion causes narrowing and mass-effect on the right upper lobe bronchus with short segment segmental bronchial occlusion to the apical segment.  The findings are highly suspicious for cavitary malignancy.  There was also a groundglass nodule in the right middle lobe measuring 1.1 cm and nonspecific and subcentimeter 0.7 cm nodule in the contralateral left lower lobe.  The patient was referred to Dr. Lamonte Sakai and on 01/07/2020 he had video bronchoscopy with endobronchial ultrasound and electromagnetic navigation procedure. The final pathology [id number]) was consistent with moderately to poorly differentiated adenocarcinoma.  The patient also had a PET scan on 01/16/2020 and it showed hypermetabolic spiculated thick-walled cavitary right upper lobe perihilar 3.4 x 3.1 cm mass with maximum SUV of 20.8.  There was also mildly hypermetabolic groundglass 1.2 cm right middle lobe pulmonary nodule with SUV max of 3.1 and low-level FDG uptake associated with the  patchy indistinct groundglass opacities throughout the left upper lobe.  There was no enlargement or hypermetabolic axillary, mediastinal or left hilar lymph nodes.  There was also no evidence of extrathoracic or skeletal metastatic disease. Dr. Lamonte Sakai kindly referred the patient to me today for evaluation and recommendation regarding treatment of his condition. When seen today the patient is feeling fine except for fatigue.  He continues to have the chest congestion and cough productive of yellowish sputum with no chest pain or hemoptysis.  He denied having any headache or visual changes.  He lost around 6 pounds in the last few weeks. Family history significant for mother with breast cancer, father had metastatic cancer to the brain.  Brother had heart disease. The patient is married and has 1 daughter.  He was accompanied today by his wife Santiago Glad.  He works as a Administrator.  He has a history of smoking 2 pack/day for around 28 years and quit 14 years ago.  He drinks alcohol on the weekends but no history of drug abuse.   HPI  Past Medical History:  Diagnosis Date  . Allergic rhinitis, cause unspecified   . Anxiety state, unspecified   . Asthma    as a child  . Chronic airway obstruction, not elsewhere classified   . Esophageal reflux   . History of kidney stones   . Hypertension   . Lumbago   . Other and unspecified hyperlipidemia   . Other chest pain     Past Surgical History:  Procedure Laterality Date  . APPENDECTOMY    . BRONCHIAL BIOPSY  01/07/2020   Procedure: BRONCHIAL BIOPSIES;  Surgeon: Collene Gobble, MD;  Location: [facility] ENDOSCOPY;  Service: Pulmonary;;  . BRONCHIAL BRUSHINGS  01/07/2020   Procedure: BRONCHIAL BRUSHINGS;  Surgeon: Collene Gobble, MD;  Location: [facility] ENDOSCOPY;  Service: Pulmonary;;  . BRONCHIAL NEEDLE ASPIRATION BIOPSY  01/07/2020   Procedure: BRONCHIAL NEEDLE ASPIRATION BIOPSIES;  Surgeon: Collene Gobble, MD;  Location: [facility] ENDOSCOPY;  Service: Pulmonary;;    . BRONCHIAL WASHINGS  01/07/2020   Procedure: BRONCHIAL WASHINGS;  Surgeon: Collene Gobble, MD;  Location: [facility] ENDOSCOPY;  Service: Pulmonary;;  . COLONOSCOPY  15 years ago  . HEMOSTASIS CONTROL  01/07/2020   Procedure: HEMOSTASIS CONTROL;  Surgeon: Collene Gobble, MD;  Location: [facility] ENDOSCOPY;  Service: Pulmonary;;  cold saline  . NASAL TURBINATE REDUCTION  2002   Dr.Crossley  . VASECTOMY    . VIDEO BRONCHOSCOPY WITH ENDOBRONCHIAL NAVIGATION N/A 01/07/2020   Procedure: VIDEO BRONCHOSCOPY WITH ENDOBRONCHIAL NAVIGATION;  Surgeon: Collene Gobble, MD;  Location: [place] ENDOSCOPY;  Service: Pulmonary;  Laterality: N/A;  . VIDEO BRONCHOSCOPY WITH ENDOBRONCHIAL ULTRASOUND N/A 01/07/2020   Procedure: VIDEO BRONCHOSCOPY WITH ENDOBRONCHIAL ULTRASOUND;  Surgeon: Collene Gobble, MD;  Location: [place] ENDOSCOPY;  Service: Pulmonary;  Laterality: N/A;    Family History  Problem Relation Age of Onset  . Breast cancer Mother   . Cancer Father   . Heart attack Brother   . Emphysema Maternal Uncle   . COPD Maternal Uncle   . Heart disease Maternal Uncle   . Colon cancer Neg Hx     Social History Social History   Tobacco Use  . Smoking status: Former Smoker    Packs/day: 2.00    Years: 28.00    Pack years: 56.00    Types: Cigarettes    Quit date: 04/25/2005    Years since quitting: 14.7  . Smokeless tobacco: Never Used  Vaping Use  . Vaping Use: Never used  Substance Use Topics  . Alcohol use: Yes    Alcohol/week: 12.0 standard drinks    Types: 12 Cans of beer per week    Comment: social use  . Drug use: No    Allergies  Allergen Reactions  . Codeine Itching    Unsure of if he is allergic    Current Outpatient Medications  Medication Sig Dispense Refill  . diclofenac Sodium (VOLTAREN) 1 % GEL Apply 1 application topically 3 (three) times daily as needed (pain/muscle aches.).    [name] DUEXIS 800-26.6 MG TABS Take 1 tablet by mouth 3 (three) times daily as needed (pain.).     [name] fenofibrate 160  MG tablet Take 1 tablet (160 mg total) by mouth daily. 90 tablet 3  . guaiFENesin (MUCINEX) 600 MG 12 hr tablet Take 600 mg by mouth every morning.    . Ipratropium-Albuterol (COMBIVENT IN) Inhale 1-2 puffs into the lungs every 6 (six) hours as needed (wheezing/shortness of breath.).    [name] LORazepam (ATIVAN) 1 MG tablet Take 1 tablet by mouth twice daily as needed for anxiety 60 tablet 0  . losartan (COZAAR) 100 MG tablet Take 1 tablet (100 mg total) by mouth daily. 90 tablet 3  . meclizine (ANTIVERT) 25 MG tablet Take 1 tablet (25 mg total) by mouth every 6 (six) hours as needed for dizziness.    . traZODone (DESYREL) 50 MG tablet Take 1-2 tablets (50-100 mg total) by mouth at bedtime as needed for sleep.    . sildenafil (VIAGRA) 100 MG tablet Take 0.5-1 tablets (50-100 mg total) by mouth daily as needed for erectile dysfunction. 10 tablet  11   No current facility-administered medications for this visit.    Review of Systems  Constitutional: positive for fatigue and weight loss Eyes: negative Ears, nose, mouth, throat, and face: negative Respiratory: positive for cough and dyspnea on exertion Cardiovascular: negative Gastrointestinal: negative Genitourinary:negative Integument/breast: negative Hematologic/lymphatic: negative Musculoskeletal:negative Neurological: negative Behavioral/Psych: negative Endocrine: negative Allergic/Immunologic: negative  Physical Exam  QMV:HQION, healthy, no distress, well nourished, well developed and anxious SKIN: skin color, texture, turgor are normal, no rashes or significant lesions HEAD: Normocephalic, No masses, lesions, tenderness or abnormalities EYES: normal, PERRLA, Conjunctiva are pink and non-injected EARS: External ears normal, Canals clear OROPHARYNX:no exudate, no erythema and lips, buccal mucosa, and tongue normal  NECK: supple, no adenopathy, no JVD LYMPH:  no palpable lymphadenopathy, no hepatosplenomegaly LUNGS: clear to  auscultation , and palpation  HEART: regular rate & rhythm, no murmurs and no gallops ABDOMEN:abdomen soft, non-tender, normal bowel sounds and no masses or organomegaly BACK: No CVA tenderness, Range of motion is normal EXTREMITIES:no joint deformities, effusion, or inflammation, no edema  NEURO: alert & oriented x 3 with fluent speech, no focal motor/sensory deficits  PERFORMANCE STATUS: ECOG 1  LABORATORY DATA: Lab Results  Component Value Date   WBC 7.1 01/17/2020   HGB 13.8 01/17/2020   HCT 40.1 01/17/2020   MCV 87.6 01/17/2020   PLT 312 01/17/2020      Chemistry      Component Value Date/Time   NA 136 01/07/2020 1018   K 3.5 01/07/2020 1018   CL 101 01/07/2020 1018   CO2 24 01/07/2020 1018   BUN 12 01/07/2020 1018   CREATININE 1.06 01/07/2020 1018   CREATININE 1.26 11/06/2019 1132      Component Value Date/Time   CALCIUM 8.9 01/07/2020 1018   ALKPHOS 49 01/07/2020 1018   AST 16 01/07/2020 1018   ALT 13 01/07/2020 1018   BILITOT 0.4 01/07/2020 1018       RADIOGRAPHIC STUDIES: DG Chest 2 View  Result Date: 12/20/2019 CLINICAL DATA:  59 year old male with cough. EXAM: CHEST - 2 VIEW COMPARISON:  Chest radiograph dated 03/30/2015. FINDINGS: Faint right mid lung field density may be artifactual or represent developing atypical infiltrate. Clinical correlation and follow-up recommended. Right apical subpleural densities appear similar to prior radiograph and chronic. There is blunting of the right costophrenic angle, chronic and likely related to scarring. No lobar consolidation or pneumothorax. The cardiac silhouette is within limits. No acute osseous pathology. IMPRESSION: Artifact versus developing atypical infiltrate in the right mid lung field. Electronically Signed   By: Anner Crete M.D.   On: 12/20/2019 01:11   CT Chest Wo Contrast  Result Date: 12/20/2019 CLINICAL DATA:  Right midlung opacity on radiograph. Shortness of breath for 1 week. EXAM: CT CHEST  WITHOUT CONTRAST TECHNIQUE: Multidetector CT imaging of the chest was performed following the standard protocol without IV contrast. COMPARISON:  Chest radiograph yesterday. FINDINGS: Cardiovascular: Atherosclerosis of the thoracic aorta. No aortic aneurysm. The heart is normal in size. Trace pericardial fluid anteriorly measuring up to 7 mm in depth. Mediastinum/Nodes: Small subcarinal node measuring 7 mm short axis. No other enlarged mediastinal nodes. Limited assessment for hilar adenopathy given lack of IV contrast. Central right lung cavitary lesion causes narrowing of the right upper lobe bronchus and slight retraction of the distal right aspect of the trachea. No esophageal wall thickening. No thyroid nodule. Lungs/Pleura: Central right perihilar cavitary lesion spans the fissure but is primarily centered in the right upper lobe measuring 3.2 x  2.7 x 2.5 cm, series 4, image 62. Lesion is thick walled with irregular margins and surrounding spiculations. Lesion causes narrowing and mass effect on the right upper lobe bronchus with short segment segmental bronchial occlusion to the apical segment. There is slight tenting of the adjacent trachea. There is fissural thickening extending along the major fissure that is slightly nodular, and mild smooth fissural thickening along the minor fissure. Faint nodular opacity at the fissural confluence. Ground-glass nodule in the periphery of the right middle lobe measures 12 mm, series 4, image 88. There is subpleural 7 mm nodule in the right middle lobe anteriorly same image. Ill-defined subpleural nodularity in the superior segment of the right lower lobe, series 4, image 101. 4 mm subpleural right middle lobe nodule series 4, image 109. 7 mm contralateral left lower lobe nodule series 4, image 100. Subsegmental linear atelectasis or scarring in the lingula. Moderate emphysema.  Right upper lobe bronchial thickening. Mild pleural thickening in the dependent right  hemithorax with pleural calcification, series 2, image 45. No significant pleural effusion. Upper Abdomen: No adrenal nodule. No evidence of focal liver lesion. The included spleen, pancreas, stomach, and colon are unremarkable. Musculoskeletal: No lytic or blastic osseous lesions. No acute osseous abnormalities. IMPRESSION: 1. Central right perihilar cavitary lesion measuring 3.2 x 2.7 x 2.5 cm. Margins are irregular and thick walled and there is mass effect on the adjacent right upper lobe bronchus and tenting of the trachea. 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 Possibility of cavitary pneumonia is also considered but felt less likely. Recommend pulmonary referral for further workup and consideration of possible bronchoscopy versus PET CT. 2. Ground-glass nodule in the right middle lobe measuring 11 mm, nonspecific. There are small subpleural solid nodules in the right middle and right upper lobe. 3. Subcentimeter 7 mm nodule in the contralateral left lower lobe. 4. Mild pleural thickening in the dependent right hemithorax with pleural calcification, possibly related to scarring. 5. Moderate emphysema. Aortic Atherosclerosis (ICD10-I70.0) and Emphysema (ICD10-J43.9). These results will be called to the ordering clinician or representative by the Radiologist Assistant, and communication documented in the PACS or [employer]. Electronically Signed   By: Keith Rake M.D.   On: 12/20/2019 21:35   NM PET Image Initial (PI) Skull Base To Thigh  Result Date: 01/16/2020 CLINICAL DATA:  Initial treatment strategy for perihilar right lung non-small cell carcinoma. EXAM: NUCLEAR MEDICINE PET SKULL BASE TO THIGH TECHNIQUE: 9.8 mCi F-18 FDG was injected intravenously. Full-ring PET imaging was performed from the skull base to thigh after the radiotracer. CT data was obtained and used for attenuation correction and anatomic localization. Fasting blood glucose: 95 mg/dl COMPARISON:  12/20/2019  chest CT. FINDINGS: Mediastinal blood pool activity: SUV max 3.0 Liver activity: SUV max NA NECK: No hypermetabolic lymph nodes in the neck. Incidental CT findings: Near complete right maxillary sinus opacification. CHEST: Hypermetabolic spiculated thick-walled cavitary right upper perihilar 3.4 x 3.1 cm lung mass with max SUV 20.8 (series 8/image 26). Mildly hypermetabolic ground-glass 1.2 cm right middle lobe pulmonary nodule with max SUV 3.1 (series 8/image 38). Low level FDG uptake associated with patchy indistinct ground-glass opacities throughout left upper lobe, for example measuring 1.4 cm with max SUV 2.1 in the left upper lobe (series 8/image 34). No enlarged or hypermetabolic axillary, mediastinal or left hilar lymph nodes. Incidental CT findings: Moderate centrilobular emphysema. Diffuse bronchial wall thickening. Two scattered small solid pulmonary nodules in the lungs bilaterally, largest 0.5 cm in the posterior left  lower lobe pulmonary nodule, below PET resolution, demonstrating no significant uptake. ABDOMEN/PELVIS: No abnormal hypermetabolic activity within the liver, pancreas, adrenal glands, or spleen. No hypermetabolic lymph nodes in the abdomen or pelvis. Heterogeneous nonfocal prostate hypermetabolism with max SUV 6.2. Incidental CT findings: Atherosclerotic nonaneurysmal abdominal aorta. SKELETON: No focal hypermetabolic activity to suggest skeletal metastasis. Incidental CT findings: none IMPRESSION: 1. Intensely hypermetabolic (max SUV 44.0) spiculated thick-walled cavitary 3.4 cm perihilar right upper lung mass compatible with known primary bronchogenic carcinoma. 2. Low level hypermetabolism associated with 1.2 cm ground-glass right middle lobe pulmonary nodule and patchy indistinct left upper lobe ground-glass opacities, equivocal for inflammatory or metastatic disease. Two 0.5 cm solid pulmonary nodules, below PET resolution. Suggest attention on follow-up chest CT in 3 months. 3. No  hypermetabolic thoracic adenopathy or extrathoracic or skeletal metastatic disease. 4. Nonspecific nonfocal heterogeneous prostate hypermetabolism. Suggest correlation with serum PSA. 5. Aortic Atherosclerosis (ICD10-I70.0) and Emphysema (ICD10-J43.9). Electronically Signed   By: Ilona Sorrel M.D.   On: 01/16/2020 11:07   DG Chest Port 1 View  Result Date: 01/07/2020 CLINICAL DATA:  59 year old male status post bronchoscopy, endobronchial ultrasound and biopsy of cavitary right upper lobe perihilar mass. EXAM: PORTABLE CHEST 1 VIEW COMPARISON:  Chest CT 12/20/2019 and earlier. FINDINGS: Portable AP upright view at 1523 hours. Stable lung volumes and mediastinal contours. No pneumothorax or pleural effusion. The left lung appears stable. There is vague new veiling perihilar density in the right lung, which could be sequelae of bronchoalveolar lavage or similar. Otherwise stable right perihilar and lung markings. Visualized tracheal air column is within normal limits. Negative visible bowel gas pattern. No acute osseous abnormality identified. IMPRESSION: No pneumothorax or adverse features status post bronchoscopy, bronchoscopic biopsy. Electronically Signed   By: Genevie Ann M.D.   On: 01/07/2020 15:49   DG C-ARM BRONCHOSCOPY  Result Date: 01/07/2020 C-ARM BRONCHOSCOPY: Fluoroscopy was utilized by the requesting physician.  No radiographic interpretation.    ASSESSMENT: This is a very pleasant 59 years old white male recently diagnosed with at least stage Ib/IIb (T2a/T3, N0, M0) non-small cell lung cancer, adenocarcinoma presented with large central perihilar mass with suspicious groundglass opacity in the right middle lobe and left upper lobe diagnosed in September 2021.  PLAN: I had a lengthy discussion with the patient and his wife today about his current disease stage, prognosis and treatment options.  I recommended for the patient to complete the staging work-up by ordering MRI of the brain to rule out  brain metastasis. I recommended for the patient to see cardiothoracic surgery for discussion of potential surgical resection but if the patient is not a good surgical candidate because of the location of the tumor, we may consider him for neoadjuvant course of concurrent chemoradiation or neoadjuvant systemic chemotherapy alone followed by reevaluation for surgical resection or consolidation immunotherapy. The patient and his wife are in agreement with this plan. I will refer him to Dr. Roxan Hockey for evaluation. I will see him back for follow-up visit after the surgical evaluation. The patient was advised to call immediately if he has any other concerning symptoms in the interval. The patient voices understanding of current disease status and treatment options and is in agreement with the current care plan.  All questions were answered. The patient knows to call the clinic with any problems, questions or concerns. We can certainly see the patient much sooner if necessary.  Thank you so much for allowing me to participate in the care of JAMIAH RECORE.  I will continue to follow up the patient with you and assist in his care.  The total time spent in the appointment was 60 minutes.  Disclaimer: This note was dictated with voice recognition software. Similar sounding words can inadvertently be transcribed and may not be corrected upon review.   Eilleen Kempf January 17, 2020, 9:01 AM

## 2020-01-20 ENCOUNTER — Encounter: Payer: Self-pay | Admitting: Family Medicine

## 2020-01-21 NOTE — Telephone Encounter (Signed)
Message sent to patient to schedule a virtual appointment. Pls advise, thanks.

## 2020-01-22 ENCOUNTER — Other Ambulatory Visit: Payer: Self-pay | Admitting: *Deleted

## 2020-01-22 ENCOUNTER — Encounter: Payer: Self-pay | Admitting: Family Medicine

## 2020-01-22 ENCOUNTER — Telehealth (INDEPENDENT_AMBULATORY_CARE_PROVIDER_SITE_OTHER): Payer: BC Managed Care – PPO | Admitting: Family Medicine

## 2020-01-22 DIAGNOSIS — R0981 Nasal congestion: Secondary | ICD-10-CM

## 2020-01-22 MED ORDER — PREDNISONE 20 MG PO TABS: 20.0000 mg | ORAL_TABLET | Freq: Two times a day (BID) | ORAL | 0 refills | Status: DC

## 2020-01-22 MED ORDER — COMBIVENT RESPIMAT 20-100 MCG/ACT IN AERS: 1.0000 | INHALATION_SPRAY | Freq: Four times a day (QID) | RESPIRATORY_TRACT | 3 refills | Status: DC | PRN

## 2020-01-22 NOTE — Progress Notes (Signed)
Richard Li - 59 y.o. male MRN [id number]  Date of birth: December 04, 1960   This visit type was conducted due to national recommendations for restrictions regarding the COVID-19 Pandemic (e.g. social distancing).  This format is felt to be most appropriate for this patient at this time.  All issues noted in this document were discussed and addressed.  No physical exam was performed (except for noted visual exam findings with Video Visits).  I discussed the limitations of evaluation and management by telemedicine and the availability of in person appointments. The patient expressed understanding and agreed to proceed.  I connected with@ on 01/22/20 at  1:40 PM EDT by a video enabled telemedicine application and verified that I am speaking with the correct person using two identifiers.  Present at visit: [name], DO [name]   Patient Location: Home [address] [place] [address]   Provider location:   [facility]  Chief Complaint  Patient presents with  . Sinusitis    HPI  Richard Li is a 59 y.o. male who presents via audio/video conferencing for a telehealth visit today.  He has history of COPD and recent dx of lung cancer.  He has upcoming MRI to evaluate for brain metastases.  He has complaint of sinus pressure and congestion.  He also has some mild chest congetion. He has had increased sputum production since bronchoscopy with some wheezing. He denies fever, chills, shortness of breath.     ROS:  A comprehensive ROS was completed and negative except as noted per HPI  Past Medical History:  Diagnosis Date  . Allergic rhinitis, cause unspecified   . Anxiety state, unspecified   . Asthma    as a child  . Chronic airway obstruction, not elsewhere classified   . Esophageal reflux   . History of kidney stones   . Hypertension   . Lumbago   . Other and unspecified hyperlipidemia   . Other chest pain     Past Surgical History:  Procedure Laterality  Date  . APPENDECTOMY    . BRONCHIAL BIOPSY  01/07/2020   Procedure: BRONCHIAL BIOPSIES;  Surgeon: Collene Gobble, MD;  Location: [facility] ENDOSCOPY;  Service: Pulmonary;;  . BRONCHIAL BRUSHINGS  01/07/2020   Procedure: BRONCHIAL BRUSHINGS;  Surgeon: Collene Gobble, MD;  Location: [facility] ENDOSCOPY;  Service: Pulmonary;;  . BRONCHIAL NEEDLE ASPIRATION BIOPSY  01/07/2020   Procedure: BRONCHIAL NEEDLE ASPIRATION BIOPSIES;  Surgeon: Collene Gobble, MD;  Location: [facility] ENDOSCOPY;  Service: Pulmonary;;  . BRONCHIAL WASHINGS  01/07/2020   Procedure: BRONCHIAL WASHINGS;  Surgeon: Collene Gobble, MD;  Location: [facility] ENDOSCOPY;  Service: Pulmonary;;  . COLONOSCOPY  15 years ago  . HEMOSTASIS CONTROL  01/07/2020   Procedure: HEMOSTASIS CONTROL;  Surgeon: Collene Gobble, MD;  Location: [facility] ENDOSCOPY;  Service: Pulmonary;;  cold saline  . NASAL TURBINATE REDUCTION  2002   Dr.Crossley  . VASECTOMY    . VIDEO BRONCHOSCOPY WITH ENDOBRONCHIAL NAVIGATION N/A 01/07/2020   Procedure: VIDEO BRONCHOSCOPY WITH ENDOBRONCHIAL NAVIGATION;  Surgeon: Collene Gobble, MD;  Location: [place] ENDOSCOPY;  Service: Pulmonary;  Laterality: N/A;  . VIDEO BRONCHOSCOPY WITH ENDOBRONCHIAL ULTRASOUND N/A 01/07/2020   Procedure: VIDEO BRONCHOSCOPY WITH ENDOBRONCHIAL ULTRASOUND;  Surgeon: Collene Gobble, MD;  Location: [facility] ENDOSCOPY;  Service: Pulmonary;  Laterality: N/A;    Family History  Problem Relation Age of Onset  . Breast cancer Mother   . Cancer Father   . Heart attack Brother   .  Emphysema Maternal Uncle   . COPD Maternal Uncle   . Heart disease Maternal Uncle   . Colon cancer Neg Hx     Social History   Socioeconomic History  . Marital status: Married    Spouse name: Designer, industrial/product  . Number of children: Not on file  . Years of education: Not on file  . Highest education level: Not on file  Occupational History  . Occupation: truck Education administrator: [employer]  Tobacco Use  . Smoking status: Former Smoker    Packs/day:  2.00    Years: 28.00    Pack years: 56.00    Types: Cigarettes    Quit date: 04/25/2005    Years since quitting: 14.7  . Smokeless tobacco: Never Used  Vaping Use  . Vaping Use: Never used  Substance and Sexual Activity  . Alcohol use: Yes    Alcohol/week: 12.0 standard drinks    Types: 12 Cans of beer per week    Comment: social use  . Drug use: No  . Sexual activity: Not on file  Other Topics Concern  . Not on file  Social History Narrative  . Not on file   Social Determinants of Health   Financial Resource Strain:   . Difficulty of Paying Living Expenses: Not on file  Food Insecurity:   . Worried About Charity fundraiser in the Last Year: Not on file  . Ran Out of Food in the Last Year: Not on file  Transportation Needs:   . Lack of Transportation (Medical): Not on file  . Lack of Transportation (Non-Medical): Not on file  Physical Activity:   . Days of Exercise per Week: Not on file  . Minutes of Exercise per Session: Not on file  Stress:   . Feeling of Stress : Not on file  Social Connections:   . Frequency of Communication with Friends and Family: Not on file  . Frequency of Social Gatherings with Friends and Family: Not on file  . Attends Religious Services: Not on file  . Active Member of Clubs or Organizations: Not on file  . Attends Archivist Meetings: Not on file  . Marital Status: Not on file  Intimate Partner Violence:   . Fear of Current or Ex-Partner: Not on file  . Emotionally Abused: Not on file  . Physically Abused: Not on file  . Sexually Abused: Not on file     Current Outpatient Medications:  .  diclofenac Sodium (VOLTAREN) 1 % GEL, Apply 1 application topically 3 (three) times daily as needed (pain/muscle aches.)., Disp: , Rfl:  .  DUEXIS 800-26.6 MG TABS, Take 1 tablet by mouth 3 (three) times daily as needed (pain.). , Disp: , Rfl:  .  fenofibrate 160 MG tablet, Take 1 tablet (160 mg total) by mouth daily., Disp: 90 tablet, Rfl:  3 .  guaiFENesin (MUCINEX) 600 MG 12 hr tablet, Take 600 mg by mouth every morning., Disp: , Rfl:  .  LORazepam (ATIVAN) 1 MG tablet, Take 1 tablet by mouth twice daily as needed for anxiety, Disp: 60 tablet, Rfl: 0 .  losartan (COZAAR) 100 MG tablet, Take 1 tablet (100 mg total) by mouth daily., Disp: 90 tablet, Rfl: 3 .  meclizine (ANTIVERT) 25 MG tablet, Take 1 tablet (25 mg total) by mouth every 6 (six) hours as needed for dizziness., Disp: , Rfl:  .  sildenafil (VIAGRA) 100 MG tablet, Take 0.5-1 tablets (50-100 mg total) by mouth  daily as needed for erectile dysfunction., Disp: 10 tablet, Rfl: 11 .  traZODone (DESYREL) 50 MG tablet, Take 1-2 tablets (50-100 mg total) by mouth at bedtime as needed for sleep., Disp: , Rfl:  .  Ipratropium-Albuterol (COMBIVENT RESPIMAT) 20-100 MCG/ACT AERS respimat, Inhale 1-2 puffs into the lungs every 6 (six) hours as needed for wheezing., Disp: 4 g, Rfl: 3 .  predniSONE (DELTASONE) 20 MG tablet, Take 1 tablet (20 mg total) by mouth 2 (two) times daily with a meal., Disp: 10 tablet, Rfl: 0  EXAM:  VITALS per patient if applicable: Temp 11.2 F (36.9 C)   Wt 194 lb (88 kg)   BMI 27.06 kg/m   GENERAL: alert, oriented, appears well and in no acute distress  HEENT: atraumatic, conjunttiva clear, no obvious abnormalities on inspection of external nose and ears  NECK: normal movements of the head and neck  LUNGS: on inspection no signs of respiratory distress, breathing rate appears normal, no obvious gross SOB, gasping or wheezing  CV: no obvious cyanosis  MS: moves all visible extremities without noticeable abnormality  PSYCH/NEURO: pleasant and cooperative, no obvious depression or anxiety, speech and thought processing grossly intact  ASSESSMENT AND PLAN:  Discussed the following assessment and plan:  Sinus congestion Recurrent issues with sinus congestion.  He has had multiple courses of antibiotics over the past year with continued recurrent  symptoms.  He doesn't really have any features suggestive of bacterial etiology today.  Will provide course of prednisone with current symptoms of sinus drainage and wheezin.  Recommend increased fluid intake as well.  He may use OTC expectorants as needed.  Combivent renewed.  Instructed to contact clinic if having new or worsening symptoms.      I discussed the assessment and treatment plan with the patient. The patient was provided an opportunity to ask questions and all were answered. The patient agreed with the plan and demonstrated an understanding of the instructions.   The patient was advised to call back or seek an in-person evaluation if the symptoms worsen or if the condition fails to improve as anticipated.    [name], DO

## 2020-01-22 NOTE — Progress Notes (Signed)
The proposed treatment discussed in cancer conference 01/16/20 is for discussion purpose only and is not a binding recommendation. The patient was not physically examined nor present for their treatment options.  Therefore, final treatment plans cannot be decided.

## 2020-01-22 NOTE — Assessment & Plan Note (Signed)
Recurrent issues with sinus congestion.  He has had multiple courses of antibiotics over the past year with continued recurrent symptoms.  He doesn't really have any features suggestive of bacterial etiology today.  Will provide course of prednisone with current symptoms of sinus drainage and wheezin.  Recommend increased fluid intake as well.  He may use OTC expectorants as needed.  Combivent renewed.  Instructed to contact clinic if having new or worsening symptoms.

## 2020-01-22 NOTE — Progress Notes (Signed)
Symptoms onset: 01/16/20 The entire episode = quite a while  Nasal congestion.  Lime green mucus.

## 2020-01-23 ENCOUNTER — Other Ambulatory Visit: Payer: Self-pay | Admitting: Family Medicine

## 2020-01-25 ENCOUNTER — Ambulatory Visit (HOSPITAL_COMMUNITY)
Admission: RE | Admit: 2020-01-25 | Discharge: 2020-01-25 | Disposition: A | Discharge status: Home / Self Care | Payer: BC Managed Care – PPO | Source: Ambulatory Visit | Attending: Internal Medicine | Admitting: Internal Medicine

## 2020-01-25 ENCOUNTER — Other Ambulatory Visit: Payer: Self-pay | Admitting: Family Medicine

## 2020-01-25 DIAGNOSIS — C3490 Malignant neoplasm of unspecified part of unspecified bronchus or lung: Secondary | ICD-10-CM

## 2020-01-25 DIAGNOSIS — F411 Generalized anxiety disorder: Secondary | ICD-10-CM

## 2020-01-25 IMAGING — MR MR HEAD WO/W CM
14 series · 48 of 48 positions shown · IV contrast (gadavist)
Comparison: PET-CT [DATE].  Cervical spine MRI [DATE].

CLINICAL DATA: 59-year-old male recently diagnosed with non-small
cell lung cancer. Staging.

EXAM:
MRI HEAD WITHOUT AND WITH CONTRAST
TECHNIQUE: Multiplanar, multiecho pulse sequences of the brain and surrounding
structures were obtained without and with intravenous contrast.
CONTRAST:  9mL GADAVIST GADOBUTROL 1 MMOL/ML IV SOLN

[Series 5: DWI · axial · 3.0mm · 1.36mm/px · z∈[+8,+156]mm · 5 of 104 slices shown (1 of 4)]
[im 1/104]
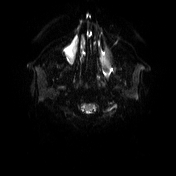
[im 26/104]
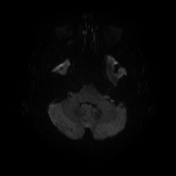
[im 52/104]
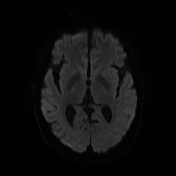
[im 78/104]
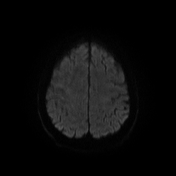
[im 104/104]
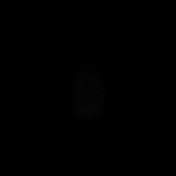

[Series 6: DWI · axial · 3.0mm · 1.36mm/px · z∈[+8,+156]mm · 3 of 51 slices shown (2 of 4)]
[im 1/51]
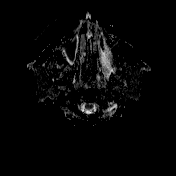
[im 26/51]
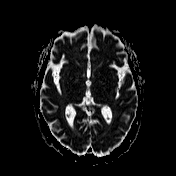
[im 51/51]
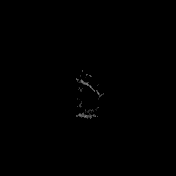

[Series 7: T1 · sagittal · 5.0mm · 0.78mm/px · 1 of 26 slices shown (1 of 2)]
[im 1/26]
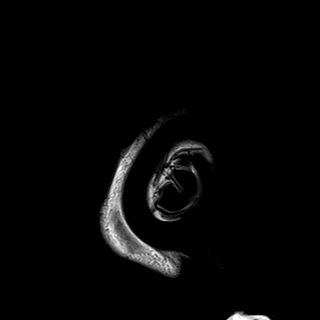

[Series 8: T2 · axial · 5.0mm · 0.62mm/px · 1 of 26 slices shown]
[im 1/26]
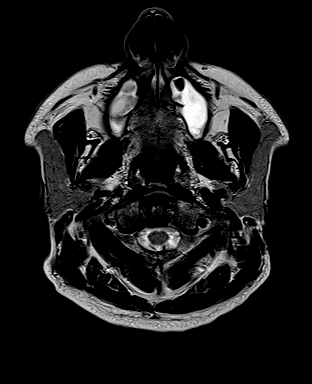

[Series 9: mip_images(sw) · axial · 24.0mm · 0.75mm/px · z∈[+12,+151]mm · 3 of 49 slices shown]
[im 1/49]
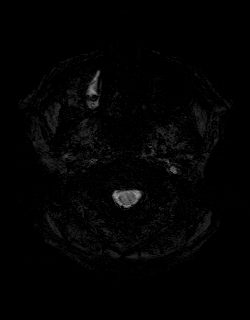
[im 25/49]
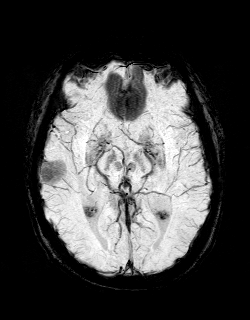
[im 49/49]
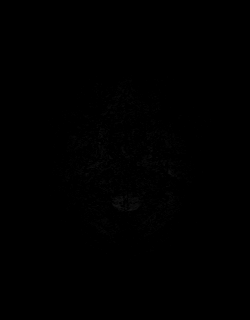

[Series 10: swi_images · axial · 3.0mm · 0.75mm/px · z∈[+2,+161]mm · 3 of 56 slices shown]
[im 1/56]
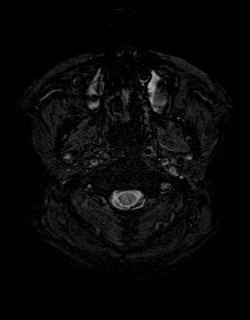
[im 28/56]
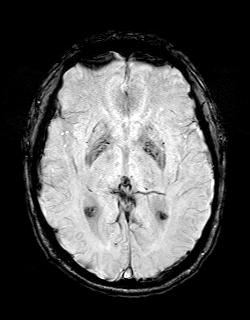
[im 56/56]
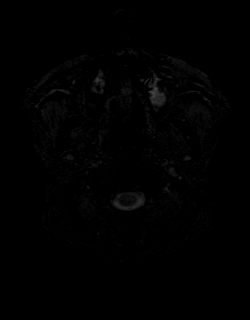

[Series 11: FLAIR · axial · 3.0mm · 0.75mm/px · z∈[+5,+158]mm · 3 of 54 slices shown]
[im 1/54]
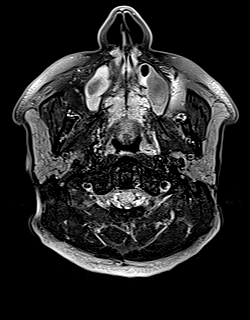
[im 27/54]
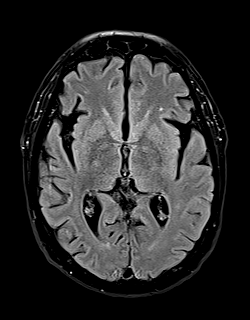
[im 54/54]
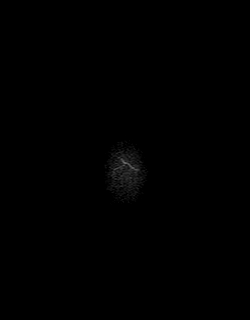

[Series 12: T1 · axial · 1.0mm · 0.94mm/px · z∈[+5,+158]mm · 9 of 160 slices shown (2 of 2)]
[im 1/160]
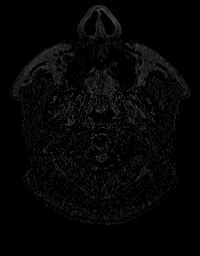
[im 20/160]
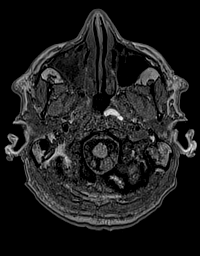
[im 40/160]
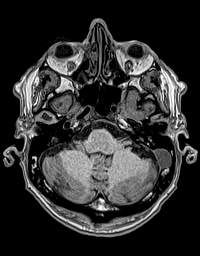
[im 60/160]
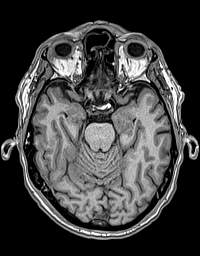
[im 80/160]
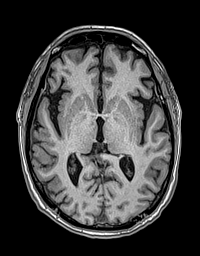
[im 100/160]
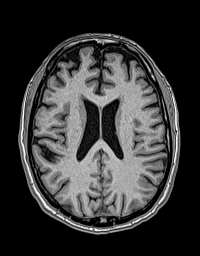
[im 120/160]
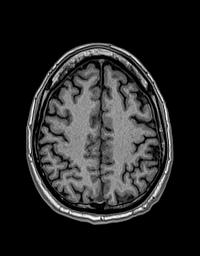
[im 140/160]
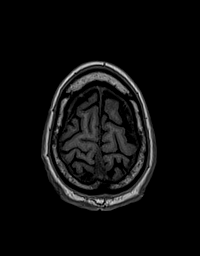
[im 160/160]
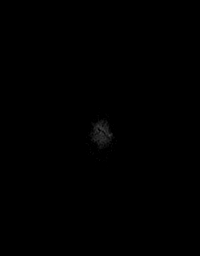

[Series 13: DWI · coronal · 5.0mm · 1.31mm/px · 4 of 80 slices shown (3 of 4)]
[im 1/80]
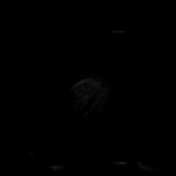
[im 27/80]
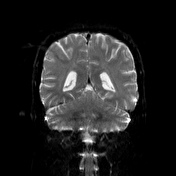
[im 53/80]
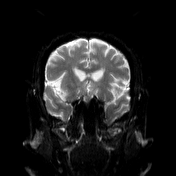
[im 80/80]
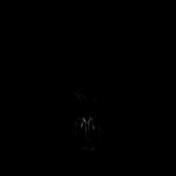

[Series 14: DWI · coronal · 5.0mm · 1.31mm/px · 2 of 40 slices shown (4 of 4)]
[im 1/40]
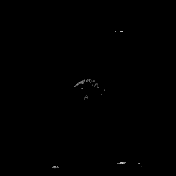
[im 40/40]
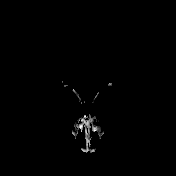

[Series 15: T2 post-contrast · coronal · 5.0mm · 0.57mm/px · 2 of 29 slices shown]
[im 1/29]
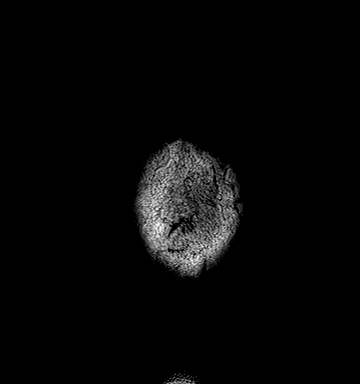
[im 29/29]
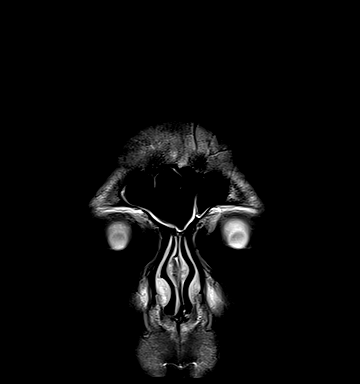

[Series 16: T1 post-contrast · axial · 1.0mm · 0.94mm/px · z∈[+5,+158]mm · 9 of 160 slices shown (1 of 3)]
[im 1/160]
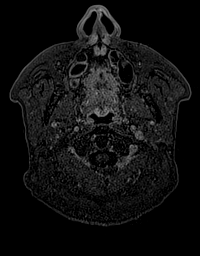
[im 20/160]
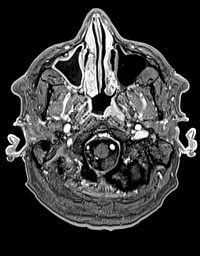
[im 40/160]
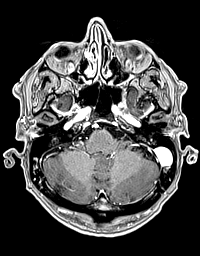
[im 60/160]
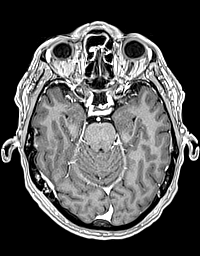
[im 80/160]
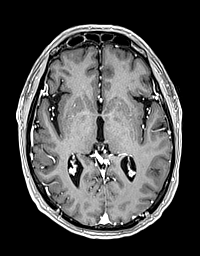
[im 100/160]
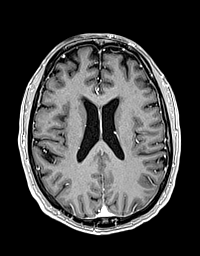
[im 120/160]
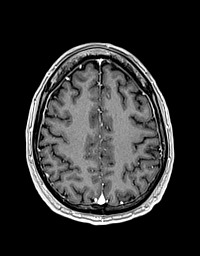
[im 140/160]
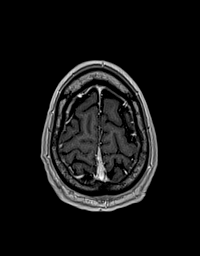
[im 160/160]
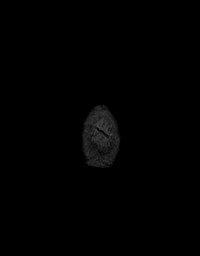

[Series 17: T1 post-contrast · coronal · 5.0mm · 0.43mm/px · 2 of 30 slices shown (2 of 3)]
[im 1/30]
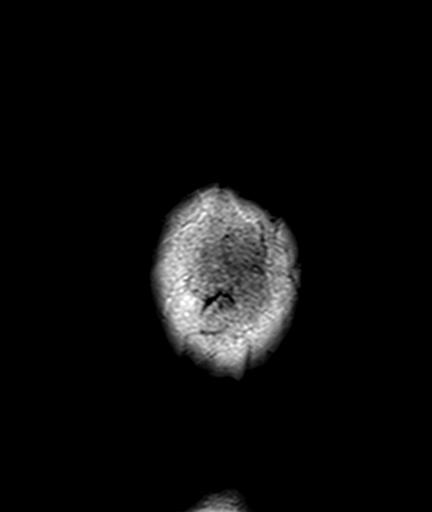
[im 30/30]
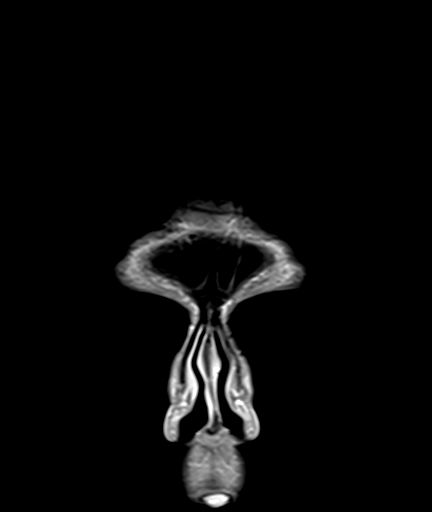

[Series 18: T1 post-contrast · sagittal · 5.0mm · 0.78mm/px · 1 of 26 slices shown (3 of 3)]
[im 1/26]
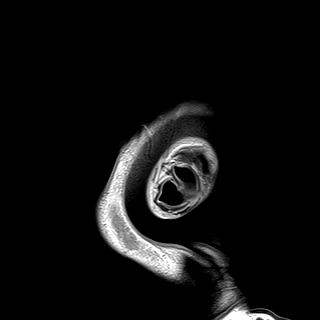

[48 of 48 positions shown; findings below may reference images not displayed]

FINDINGS: Brain: No midline shift, mass effect, or evidence of intracranial
mass lesion. No abnormal enhancement identified. No dural thickening
identified.

No restricted diffusion to suggest acute infarction. No
ventriculomegaly, extra-axial collection or acute intracranial
hemorrhage. Cervicomedullary junction and pituitary are within
normal limits.

Gray and white matter signal is within normal limits for age
throughout the brain; minimal nonspecific scattered white matter T2
and FLAIR hyperintensity. No cortical encephalomalacia or chronic
cerebral blood products.

Vascular: Major intracranial vascular flow voids are preserved, the
left vertebral artery appears dominant as on the cervical spine
study last month.

The left transverse and sigmoid sinuses appear dominant. The left IJ
bulb is patent. However, the right IJ bulb appears occluded and the
right sigmoid sinus appears atretic (series 16, image 33). The left
IJ appear dominant on the cervical MRI last month.

Skull and upper cervical spine: Visualized bone marrow signal is
within normal limits. Negative visible cervical spine and spinal
cord.

Sinuses/Orbits: Negative orbits.

Mucosal thickening and inspissated material throughout the right
maxillary sinus. Small left maxillary sinus mucous retention cysts.
Mild retained secretions in the nasal cavity. Mild ethmoid and
frontal sinus mucosal thickening.

Other: Mastoids are well pneumatized. Visible scalp and face appear
negative.
IMPRESSION: 1. No metastatic disease or acute intracranial abnormality
identified.

2. Chronically atretic and occluded appearance of the right sigmoid
sinus and the right IJ bulb. The left internal jugular vein appeared
dominant on the cervical MRI last month.

3. Right maxillary and other lesser bilateral paranasal sinus
disease.

## 2020-01-25 MED ORDER — GADOBUTROL 1 MMOL/ML IV SOLN
9.0000 mL | Freq: Once | INTRAVENOUS | Status: AC | PRN
  Administered 2020-01-25: 9 mL via INTRAVENOUS

## 2020-01-27 ENCOUNTER — Encounter: Payer: Self-pay | Admitting: Internal Medicine

## 2020-01-27 ENCOUNTER — Other Ambulatory Visit: Payer: Self-pay

## 2020-01-27 ENCOUNTER — Institutional Professional Consult (permissible substitution) (INDEPENDENT_AMBULATORY_CARE_PROVIDER_SITE_OTHER): Payer: BC Managed Care – PPO | Admitting: Thoracic Surgery (Cardiothoracic Vascular Surgery)

## 2020-01-27 ENCOUNTER — Other Ambulatory Visit: Payer: Self-pay | Admitting: Family Medicine

## 2020-01-27 VITALS — BP 119/75 | HR 90 | Temp 97.5°F | Resp 20 | Wt 199.4 lb

## 2020-01-27 DIAGNOSIS — R918 Other nonspecific abnormal finding of lung field: Secondary | ICD-10-CM | POA: Diagnosis not present

## 2020-01-27 DIAGNOSIS — F411 Generalized anxiety disorder: Secondary | ICD-10-CM

## 2020-01-27 MED ORDER — LORAZEPAM 1 MG PO TABS: ORAL_TABLET | ORAL | 2 refills | Status: DC

## 2020-01-27 MED ORDER — LORAZEPAM 1 MG PO TABS: ORAL_TABLET | ORAL | 0 refills | Status: DC

## 2020-01-27 NOTE — Progress Notes (Signed)
PCP is [name], DO Referring Provider is Curt Bears, MD  Chief Complaint  Patient presents with  . Lung Mass    dx of non small cell lung cancer, adenocarcinoma.     HPI: Richard Li is sent for consultation regarding a newly diagnosed adenocarcinoma.  Richard Li is a 59 year old man with a history of tobacco abuse, COPD, asthma, reflux, hypertension, anxiety, and thoracic aortic atherosclerosis.  He smoked about 2 packs a day from age 49 until about 14 (60 pack years).  He quit in 2007.  About 3 months ago he developed sinus infection.  He was treated with antibiotics but continued to have some sinus congestion but also developed congestion in his chest.  He was complaining of shortness of breath and frequent coughing.  He had green mucus but no hemoptysis.  He also was having wheezing.  A chest x-ray was done which showed a right lung opacity.  He then had a CT of the chest which showed a 3.7 x 2.7 x 2.5 cm spiculated cavitary mass in the right upper lobe possibly crossing the fissure into the superior segment in close proximity to the right mainstem bronchus posteriorly.  There were groundglass opacities in the right middle lobe and left lower lobe.  PET/CT showed the right upper lobe mass was intensely hypermetabolic with an SUV max of 20.8.  There also was metabolic activity in the right middle lobe as well as two hypermetabolic lesions in the left lung.  There was no adenopathy or evidence of distant metastases.  Dr. Lamonte Sakai did a bronchoscopy which showed adenocarcinoma.  There was irregular mucosa extending into the right mainstem bronchus and down along the bronchus intermedius.  He continues to have shortness of breath and wheezing.  Sounds like the shortness of breath really is more associated with wheezing than activity.  He can walk up a flight of stairs without problems and he does not get short of breath walking.  He is not having any chest pain, pressure, or tightness.   He has lost about 30 pounds over the past 3 months.  Some of that was intentional.  He continues to work as a Administrator.  Zubrod Score: At the time of surgery this patient's most appropriate activity status/level should be described as: []     0    Normal activity, no symptoms [x]     1    Restricted in physical strenuous activity but ambulatory, able to do out light work []     2    Ambulatory and capable of self care, unable to do work activities, up and about >50 % of waking hours                              []     3    Only limited self care, in bed greater than 50% of waking hours []     4    Completely disabled, no self care, confined to bed or chair []     5    Moribund  Past Medical History:  Diagnosis Date  . Allergic rhinitis, cause unspecified   . Anxiety state, unspecified   . Asthma    as a child  . Chronic airway obstruction, not elsewhere classified   . Esophageal reflux   . History of kidney stones   . Hypertension   . Lumbago   . Other and unspecified hyperlipidemia   . Other chest pain  Past Surgical History:  Procedure Laterality Date  . APPENDECTOMY    . BRONCHIAL BIOPSY  01/07/2020   Procedure: BRONCHIAL BIOPSIES;  Surgeon: Collene Gobble, MD;  Location: [facility] ENDOSCOPY;  Service: Pulmonary;;  . BRONCHIAL BRUSHINGS  01/07/2020   Procedure: BRONCHIAL BRUSHINGS;  Surgeon: Collene Gobble, MD;  Location: [facility] ENDOSCOPY;  Service: Pulmonary;;  . BRONCHIAL NEEDLE ASPIRATION BIOPSY  01/07/2020   Procedure: BRONCHIAL NEEDLE ASPIRATION BIOPSIES;  Surgeon: Collene Gobble, MD;  Location: [facility] ENDOSCOPY;  Service: Pulmonary;;  . BRONCHIAL WASHINGS  01/07/2020   Procedure: BRONCHIAL WASHINGS;  Surgeon: Collene Gobble, MD;  Location: [facility] ENDOSCOPY;  Service: Pulmonary;;  . COLONOSCOPY  15 years ago  . HEMOSTASIS CONTROL  01/07/2020   Procedure: HEMOSTASIS CONTROL;  Surgeon: Collene Gobble, MD;  Location: [facility] ENDOSCOPY;  Service: Pulmonary;;  cold saline  . NASAL TURBINATE  REDUCTION  2002   Dr.Crossley  . VASECTOMY    . VIDEO BRONCHOSCOPY WITH ENDOBRONCHIAL NAVIGATION N/A 01/07/2020   Procedure: VIDEO BRONCHOSCOPY WITH ENDOBRONCHIAL NAVIGATION;  Surgeon: Collene Gobble, MD;  Location: [place] ENDOSCOPY;  Service: Pulmonary;  Laterality: N/A;  . VIDEO BRONCHOSCOPY WITH ENDOBRONCHIAL ULTRASOUND N/A 01/07/2020   Procedure: VIDEO BRONCHOSCOPY WITH ENDOBRONCHIAL ULTRASOUND;  Surgeon: Collene Gobble, MD;  Location: [place] ENDOSCOPY;  Service: Pulmonary;  Laterality: N/A;    Family History  Problem Relation Age of Onset  . Breast cancer Mother   . Cancer Father   . Heart attack Brother   . Emphysema Maternal Uncle   . COPD Maternal Uncle   . Heart disease Maternal Uncle   . Colon cancer Neg Hx     Social History Social History   Tobacco Use  . Smoking status: Former Smoker    Packs/day: 2.00    Years: 28.00    Pack years: 56.00    Types: Cigarettes    Quit date: 04/25/2005    Years since quitting: 14.7  . Smokeless tobacco: Never Used  Vaping Use  . Vaping Use: Never used  Substance Use Topics  . Alcohol use: Yes    Alcohol/week: 12.0 standard drinks    Types: 12 Cans of beer per week    Comment: social use  . Drug use: No    Current Outpatient Medications  Medication Sig Dispense Refill  . diclofenac Sodium (VOLTAREN) 1 % GEL Apply 1 application topically 3 (three) times daily as needed (pain/muscle aches.).    [name] DUEXIS 800-26.6 MG TABS Take 1 tablet by mouth 3 (three) times daily as needed (pain.).     [name] fenofibrate 160 MG tablet Take 1 tablet (160 mg total) by mouth daily. 90 tablet 3  . guaiFENesin (MUCINEX) 600 MG 12 hr tablet Take 600 mg by mouth every morning.    . Ipratropium-Albuterol (COMBIVENT RESPIMAT) 20-100 MCG/ACT AERS respimat Inhale 1-2 puffs into the lungs every 6 (six) hours as needed for wheezing. 4 g 3  . LORazepam (ATIVAN) 1 MG tablet TAKE 1 TABLET BY MOUTH THREE TIMES DAILY AS NEEDED FOR ANXIETY 90 tablet 2  . losartan (COZAAR)  100 MG tablet Take 1 tablet (100 mg total) by mouth daily. 90 tablet 3  . meclizine (ANTIVERT) 25 MG tablet Take 1 tablet (25 mg total) by mouth every 6 (six) hours as needed for dizziness.    . predniSONE (DELTASONE) 20 MG tablet Take 1 tablet (20 mg total) by mouth 2 (two) times daily with a meal. 10 tablet 0  . sildenafil (VIAGRA) 100 MG tablet  Take 0.5-1 tablets (50-100 mg total) by mouth daily as needed for erectile dysfunction. 10 tablet 11  . traZODone (DESYREL) 50 MG tablet TAKE 1 TO 2 TABLETS BY MOUTH AT BEDTIME AS NEEDED FOR SLEEP 45 tablet 0   No current facility-administered medications for this visit.    Allergies  Allergen Reactions  . Codeine Itching    Unsure of if he is allergic    Review of Systems  Constitutional: Positive for appetite change and unexpected weight change. Negative for activity change.  HENT: Negative for trouble swallowing and voice change.   Eyes: Negative for visual disturbance.  Respiratory: Positive for cough, shortness of breath and wheezing.   Cardiovascular: Negative for chest pain and leg swelling.  Gastrointestinal: Positive for abdominal pain (Reflux). Negative for abdominal distention.  Genitourinary: Negative for difficulty urinating and dysuria.  Neurological: Positive for dizziness. Negative for seizures, syncope and weakness.  Hematological: Negative for adenopathy. Does not bruise/bleed easily.  Psychiatric/Behavioral: The patient is nervous/anxious.     BP 119/75   Pulse 90   Temp (!) 97.5 F (36.4 C)   Resp 20   Wt 199 lb 6.4 oz (90.4 kg)   SpO2 97% Comment: RA with mask on  BMI 27.81 kg/m  Physical Exam Vitals reviewed.  Constitutional:      Appearance: Normal appearance.  HENT:     Head: Normocephalic and atraumatic.  Eyes:     General: No scleral icterus.    Extraocular Movements: Extraocular movements intact.  Cardiovascular:     Rate and Rhythm: Normal rate and regular rhythm.     Heart sounds: Normal heart  sounds. No murmur heard.  No friction rub. No gallop.   Pulmonary:     Effort: Pulmonary effort is normal. No respiratory distress.     Breath sounds: Normal breath sounds. No wheezing or rales.  Abdominal:     General: There is no distension.     Palpations: Abdomen is soft.     Tenderness: There is no abdominal tenderness.  Musculoskeletal:     Cervical back: Neck supple.  Lymphadenopathy:     Cervical: No cervical adenopathy.  Skin:    General: Skin is warm and dry.  Neurological:     General: No focal deficit present.     Mental Status: He is alert and oriented to person, place, and time.     Cranial Nerves: No cranial nerve deficit.     Motor: No weakness.     Gait: Gait normal.    Diagnostic Tests: CT CHEST WITHOUT CONTRAST  TECHNIQUE: Multidetector CT imaging of the chest was performed following the standard protocol without IV contrast.  COMPARISON:  Chest radiograph yesterday.  FINDINGS: Cardiovascular: Atherosclerosis of the thoracic aorta. No aortic aneurysm. The heart is normal in size. Trace pericardial fluid anteriorly measuring up to 7 mm in depth.  Mediastinum/Nodes: Small subcarinal node measuring 7 mm short axis. No other enlarged mediastinal nodes. Limited assessment for hilar adenopathy given lack of IV contrast. Central right lung cavitary lesion causes narrowing of the right upper lobe bronchus and slight retraction of the distal right aspect of the trachea. No esophageal wall thickening. No thyroid nodule.  Lungs/Pleura: Central right perihilar cavitary lesion spans the fissure but is primarily centered in the right upper lobe measuring 3.2 x 2.7 x 2.5 cm, series 4, image 62. Lesion is thick walled with irregular margins and surrounding spiculations. Lesion causes narrowing and mass effect on the right upper lobe bronchus with short segment  segmental bronchial occlusion to the apical segment. There is slight tenting of the adjacent  trachea. There is fissural thickening extending along the major fissure that is slightly nodular, and mild smooth fissural thickening along the minor fissure. Faint nodular opacity at the fissural confluence. Ground-glass nodule in the periphery of the right middle lobe measures 12 mm, series 4, image 88. There is subpleural 7 mm nodule in the right middle lobe anteriorly same image. Ill-defined subpleural nodularity in the superior segment of the right lower lobe, series 4, image 101. 4 mm subpleural right middle lobe nodule series 4, image 109.  7 mm contralateral left lower lobe nodule series 4, image 100. Subsegmental linear atelectasis or scarring in the lingula.  Moderate emphysema.  Right upper lobe bronchial thickening.  Mild pleural thickening in the dependent right hemithorax with pleural calcification, series 2, image 45. No significant pleural effusion.  Upper Abdomen: No adrenal nodule. No evidence of focal liver lesion. The included spleen, pancreas, stomach, and colon are unremarkable.  Musculoskeletal: No lytic or blastic osseous lesions. No acute osseous abnormalities.  IMPRESSION: 1. Central right perihilar cavitary lesion measuring 3.2 x 2.7 x 2.5 cm. Margins are irregular and thick walled and there is mass effect on the adjacent right upper lobe bronchus and tenting of the trachea. 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 Possibility of cavitary pneumonia is also considered but felt less likely. Recommend pulmonary referral for further workup and consideration of possible bronchoscopy versus PET CT. 2. Ground-glass nodule in the right middle lobe measuring 11 mm, nonspecific. There are small subpleural solid nodules in the right middle and right upper lobe. 3. Subcentimeter 7 mm nodule in the contralateral left lower lobe. 4. Mild pleural thickening in the dependent right hemithorax with pleural calcification, possibly related to  scarring. 5. Moderate emphysema.  Aortic Atherosclerosis (ICD10-I70.0) and Emphysema (ICD10-J43.9).  These results will be called to the ordering clinician or representative by the Radiologist Assistant, and communication documented in the PACS or [employer].   Electronically Signed   By: Keith Rake M.D.   On: 12/20/2019 21:35 NUCLEAR MEDICINE PET SKULL BASE TO THIGH  TECHNIQUE: 9.8 mCi F-18 FDG was injected intravenously. Full-ring PET imaging was performed from the skull base to thigh after the radiotracer. CT data was obtained and used for attenuation correction and anatomic localization.  Fasting blood glucose: 95 mg/dl  COMPARISON:  12/20/2019 chest CT.  FINDINGS: Mediastinal blood pool activity: SUV max 3.0  Liver activity: SUV max NA  NECK: No hypermetabolic lymph nodes in the neck.  Incidental CT findings: Near complete right maxillary sinus opacification.  CHEST:  Hypermetabolic spiculated thick-walled cavitary right upper perihilar 3.4 x 3.1 cm lung mass with max SUV 20.8 (series 8/image 26).  Mildly hypermetabolic ground-glass 1.2 cm right middle lobe pulmonary nodule with max SUV 3.1 (series 8/image 38). Low level FDG uptake associated with patchy indistinct ground-glass opacities throughout left upper lobe, for example measuring 1.4 cm with max SUV 2.1 in the left upper lobe (series 8/image 34).  No enlarged or hypermetabolic axillary, mediastinal or left hilar lymph nodes.  Incidental CT findings: Moderate centrilobular emphysema. Diffuse bronchial wall thickening. Two scattered small solid pulmonary nodules in the lungs bilaterally, largest 0.5 cm in the posterior left lower lobe pulmonary nodule, below PET resolution, demonstrating no significant uptake.  ABDOMEN/PELVIS: No abnormal hypermetabolic activity within the liver, pancreas, adrenal glands, or spleen. No hypermetabolic lymph nodes in the abdomen or  pelvis.  Heterogeneous nonfocal prostate hypermetabolism  with max SUV 6.2.  Incidental CT findings: Atherosclerotic nonaneurysmal abdominal aorta.  SKELETON: No focal hypermetabolic activity to suggest skeletal metastasis.  Incidental CT findings: none  IMPRESSION: 1. Intensely hypermetabolic (max SUV 00.9) spiculated thick-walled cavitary 3.4 cm perihilar right upper lung mass compatible with known primary bronchogenic carcinoma. 2. Low level hypermetabolism associated with 1.2 cm ground-glass right middle lobe pulmonary nodule and patchy indistinct left upper lobe ground-glass opacities, equivocal for inflammatory or metastatic disease. Two 0.5 cm solid pulmonary nodules, below PET resolution. Suggest attention on follow-up chest CT in 3 months. 3. No hypermetabolic thoracic adenopathy or extrathoracic or skeletal metastatic disease. 4. Nonspecific nonfocal heterogeneous prostate hypermetabolism. Suggest correlation with serum PSA. 5. Aortic Atherosclerosis (ICD10-I70.0) and Emphysema (ICD10-J43.9).   Electronically Signed   By: Ilona Sorrel M.D.   On: 01/16/2020 11:07 I personally reviewed the CT and PET/CT and concur with the findings noted above  Impression: Richard Li is a 59 year old former smoker with a past medical history significant for tobacco abuse, COPD, asthma, reflux, hypertension, anxiety, and thoracic aortic atherosclerosis.  He recently presented with a productive cough and wheezing.  Work-up ultimately revealed a 3.7 x 2.7 x 2.5 cm spiculated mass in the right upper lobe.  This mass is in close proximity to the right mainstem bronchus near the carina and also appears to cross the fissure into the superior segment of the lower lobe.  Unfortunately from the description of bronchoscopy, my discussion with Dr. Lamonte Sakai about the findings and the CT and PET images, I do not think this mass is resectable currently.  I think even with a pneumonectomy we would be  unlikely to have a complete resection/negative margin..  I think the best option would be to treat him with chemotherapy and a limited dose of radiation and assess the response.  We then could consider whether to attempt resection or to proceed with definitive dose of radiation.  Plan: We will refer back to Dr. Julien Nordmann and to radiation oncology.  I spent 40 minutes in review of images, records, discussion with colleagues and in consultation with Richard Li today.  Melrose Nakayama, MD [facility] [phone]

## 2020-01-28 ENCOUNTER — Telehealth: Payer: Self-pay | Admitting: *Deleted

## 2020-01-28 NOTE — Telephone Encounter (Signed)
I received a message from Dr. Roxan Hockey that patient needs to be seen to discuss concurrent systemic therapy with xrt.  I called and spoke to patient today.  I updated him on his appt.

## 2020-01-30 ENCOUNTER — Other Ambulatory Visit: Payer: Self-pay

## 2020-01-30 ENCOUNTER — Inpatient Hospital Stay: Payer: BC Managed Care – PPO | Attending: Internal Medicine | Admitting: Internal Medicine

## 2020-01-30 ENCOUNTER — Telehealth: Payer: Self-pay | Admitting: *Deleted

## 2020-01-30 ENCOUNTER — Encounter: Payer: Self-pay | Admitting: *Deleted

## 2020-01-30 ENCOUNTER — Encounter: Payer: Self-pay | Admitting: Internal Medicine

## 2020-01-30 VITALS — BP 116/61 | HR 100 | Temp 98.2°F | Resp 20 | Ht 70.5 in | Wt 200.9 lb

## 2020-01-30 DIAGNOSIS — Z79899 Other long term (current) drug therapy: Secondary | ICD-10-CM | POA: Diagnosis not present

## 2020-01-30 DIAGNOSIS — R5383 Other fatigue: Secondary | ICD-10-CM | POA: Insufficient documentation

## 2020-01-30 DIAGNOSIS — C3491 Malignant neoplasm of unspecified part of right bronchus or lung: Secondary | ICD-10-CM

## 2020-01-30 DIAGNOSIS — Z23 Encounter for immunization: Secondary | ICD-10-CM | POA: Diagnosis not present

## 2020-01-30 DIAGNOSIS — K219 Gastro-esophageal reflux disease without esophagitis: Secondary | ICD-10-CM | POA: Diagnosis not present

## 2020-01-30 DIAGNOSIS — J449 Chronic obstructive pulmonary disease, unspecified: Secondary | ICD-10-CM | POA: Diagnosis not present

## 2020-01-30 DIAGNOSIS — Z7952 Long term (current) use of systemic steroids: Secondary | ICD-10-CM | POA: Diagnosis not present

## 2020-01-30 DIAGNOSIS — Z5111 Encounter for antineoplastic chemotherapy: Secondary | ICD-10-CM | POA: Diagnosis not present

## 2020-01-30 DIAGNOSIS — F419 Anxiety disorder, unspecified: Secondary | ICD-10-CM | POA: Diagnosis not present

## 2020-01-30 DIAGNOSIS — Z923 Personal history of irradiation: Secondary | ICD-10-CM | POA: Insufficient documentation

## 2020-01-30 DIAGNOSIS — C3411 Malignant neoplasm of upper lobe, right bronchus or lung: Secondary | ICD-10-CM | POA: Insufficient documentation

## 2020-01-30 DIAGNOSIS — I10 Essential (primary) hypertension: Secondary | ICD-10-CM | POA: Diagnosis not present

## 2020-01-30 MED ORDER — PROCHLORPERAZINE MALEATE 10 MG PO TABS: 10.0000 mg | ORAL_TABLET | Freq: Four times a day (QID) | ORAL | 0 refills | Status: DC | PRN

## 2020-01-30 NOTE — Progress Notes (Signed)
START ON PATHWAY REGIMEN - Non-Small Cell Lung     Administer weekly:     Paclitaxel      Carboplatin   **Always confirm dose/schedule in your pharmacy ordering system**  Patient Characteristics: Preoperative or Nonsurgical Candidate (Clinical Staging), Stage II, Nonsurgical Candidate Therapeutic Status: Preoperative or Nonsurgical Candidate (Clinical Staging) AJCC T Category: cT3 AJCC N Category: cN0 AJCC M Category: cM0 AJCC 8 Stage Grouping: IIB Intent of Therapy: Curative Intent, Discussed with Patient

## 2020-01-30 NOTE — Telephone Encounter (Signed)
Gave Ros[employer] FMLA papers

## 2020-01-30 NOTE — Progress Notes (Signed)
Oncology Nurse Navigator Documentation  Oncology Nurse Navigator Flowsheets 01/30/2020  Abnormal Finding Date 12/20/2019  Confirmed Diagnosis Date 01/07/2020  Diagnosis Status Confirmed Diagnosis Complete  Planned Course of Treatment Chemo/Radiation Concurrent  Phase of Treatment Radiation  Navigator Follow Up Date: 02/03/2020  Navigator Follow Up Reason: Appointment Review  Navigator Location [facility]  Navigator Encounter Type Clinic/MDC  Multidisiplinary Clinic Date 01/30/2020  Multidisiplinary Clinic Type Thoracic  Patient Visit Type MedOnc  Treatment Phase Pre-Tx/Tx Discussion  Barriers/Navigation Needs Coordination of Care;Education;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

## 2020-01-30 NOTE — Progress Notes (Signed)
[facility] Telephone:([phone]) [phone]   [phone]  OFFICE PROGRESS NOTE  [name], DO [place] [address]  DIAGNOSIS: Stage IIB (T3, N0, M0) non-small cell lung cancer, adenocarcinoma presented with large central perihilar mass with suspicious groundglass opacity in the right middle lobe and left upper lobe diagnosed in September 2021.  PRIOR THERAPY:None.   CURRENT THERAPY: Concurrent chemoradiation with weekly carboplatin for AUC of 2 and paclitaxel 45 mg/M2.  First dose February 10, 2020.  INTERVAL HISTORY: Richard Li 59 y.o. male returns to the clinic today for follow-up visit accompanied by his wife.  The patient is feeling fine today with no concerning complaints except for anxiety about his diagnosis.  He had MRI of the brain that showed no evidence of metastatic disease to the brain.  The patient was evaluated by Dr. Roxan Hockey recently for consideration of surgical resection but he would probably require a pneumonectomy with no guarantee of disease clearance because of the central location of the tumor.  The patient was referred back to me today for evaluation and recommendation regarding neoadjuvant treatment.  He is feeling fine with no concerning complaints.  He denied having any current chest pain, shortness of breath, cough or hemoptysis.  He denied having any fever or chills.  He has no nausea, vomiting, diarrhea or constipation.  He has no headache or visual changes.  MEDICAL HISTORY: Past Medical History:  Diagnosis Date  . Allergic rhinitis, cause unspecified   . Anxiety state, unspecified   . Asthma    as a child  . Chronic airway obstruction, not elsewhere classified   . Esophageal reflux   . History of kidney stones   . Hypertension   . Lumbago   . Other and unspecified hyperlipidemia   . Other chest pain     ALLERGIES:  is allergic to codeine.  MEDICATIONS:  Current Outpatient  Medications  Medication Sig Dispense Refill  . diclofenac Sodium (VOLTAREN) 1 % GEL Apply 1 application topically 3 (three) times daily as needed (pain/muscle aches.).    [name] DUEXIS 800-26.6 MG TABS Take 1 tablet by mouth 3 (three) times daily as needed (pain.).     [name] fenofibrate 160 MG tablet Take 1 tablet (160 mg total) by mouth daily. 90 tablet 3  . guaiFENesin (MUCINEX) 600 MG 12 hr tablet Take 600 mg by mouth every morning.    . Ipratropium-Albuterol (COMBIVENT RESPIMAT) 20-100 MCG/ACT AERS respimat Inhale 1-2 puffs into the lungs every 6 (six) hours as needed for wheezing. 4 g 3  . LORazepam (ATIVAN) 1 MG tablet TAKE 1 TABLET BY MOUTH THREE TIMES DAILY AS NEEDED FOR ANXIETY 90 tablet 2  . losartan (COZAAR) 100 MG tablet Take 1 tablet (100 mg total) by mouth daily. 90 tablet 3  . meclizine (ANTIVERT) 25 MG tablet Take 1 tablet (25 mg total) by mouth every 6 (six) hours as needed for dizziness.    . predniSONE (DELTASONE) 20 MG tablet Take 1 tablet (20 mg total) by mouth 2 (two) times daily with a meal. 10 tablet 0  . sildenafil (VIAGRA) 100 MG tablet Take 0.5-1 tablets (50-100 mg total) by mouth daily as needed for erectile dysfunction. 10 tablet 11  . traZODone (DESYREL) 50 MG tablet TAKE 1 TO 2 TABLETS BY MOUTH AT BEDTIME AS NEEDED FOR SLEEP 45 tablet 5   No current facility-administered medications for this visit.    SURGICAL HISTORY:  Past Surgical History:  Procedure Laterality Date  . APPENDECTOMY    . BRONCHIAL BIOPSY  01/07/2020   Procedure: BRONCHIAL BIOPSIES;  Surgeon: Collene Gobble, MD;  Location: [facility] ENDOSCOPY;  Service: Pulmonary;;  . BRONCHIAL BRUSHINGS  01/07/2020   Procedure: BRONCHIAL BRUSHINGS;  Surgeon: Collene Gobble, MD;  Location: [facility] ENDOSCOPY;  Service: Pulmonary;;  . BRONCHIAL NEEDLE ASPIRATION BIOPSY  01/07/2020   Procedure: BRONCHIAL NEEDLE ASPIRATION BIOPSIES;  Surgeon: Collene Gobble, MD;  Location: [facility] ENDOSCOPY;  Service: Pulmonary;;  . BRONCHIAL WASHINGS   01/07/2020   Procedure: BRONCHIAL WASHINGS;  Surgeon: Collene Gobble, MD;  Location: [facility] ENDOSCOPY;  Service: Pulmonary;;  . COLONOSCOPY  15 years ago  . HEMOSTASIS CONTROL  01/07/2020   Procedure: HEMOSTASIS CONTROL;  Surgeon: Collene Gobble, MD;  Location: [facility] ENDOSCOPY;  Service: Pulmonary;;  cold saline  . NASAL TURBINATE REDUCTION  2002   Dr.Crossley  . VASECTOMY    . VIDEO BRONCHOSCOPY WITH ENDOBRONCHIAL NAVIGATION N/A 01/07/2020   Procedure: VIDEO BRONCHOSCOPY WITH ENDOBRONCHIAL NAVIGATION;  Surgeon: Collene Gobble, MD;  Location: [facility] ENDOSCOPY;  Service: Pulmonary;  Laterality: N/A;  . VIDEO BRONCHOSCOPY WITH ENDOBRONCHIAL ULTRASOUND N/A 01/07/2020   Procedure: VIDEO BRONCHOSCOPY WITH ENDOBRONCHIAL ULTRASOUND;  Surgeon: Collene Gobble, MD;  Location: [facility] ENDOSCOPY;  Service: Pulmonary;  Laterality: N/A;    REVIEW OF SYSTEMS:  Constitutional: positive for fatigue Eyes: negative Ears, nose, mouth, throat, and face: negative Respiratory: positive for pleurisy/chest pain Cardiovascular: negative Gastrointestinal: negative Genitourinary:negative Integument/breast: negative Hematologic/lymphatic: negative Musculoskeletal:negative Neurological: negative Behavioral/Psych: negative Endocrine: negative Allergic/Immunologic: negative   PHYSICAL EXAMINATION: General appearance: alert, cooperative, fatigued and no distress Head: Normocephalic, without obvious abnormality, atraumatic Neck: no adenopathy, no JVD, supple, symmetrical, trachea midline and thyroid not enlarged, symmetric, no tenderness/mass/nodules Lymph nodes: Cervical, supraclavicular, and axillary nodes normal. Resp: clear to auscultation bilaterally Back: symmetric, no curvature. ROM normal. No CVA tenderness. Cardio: regular rate and rhythm, S1, S2 normal, no murmur, click, rub or gallop GI: soft, non-tender; bowel sounds normal; no masses,  no organomegaly Extremities: extremities normal, atraumatic, no cyanosis or edema  Neurologic: Alert and oriented X 3, normal strength and tone. Normal symmetric reflexes. Normal coordination and gait  ECOG PERFORMANCE STATUS: 1 - Symptomatic but completely ambulatory  Blood pressure 116/61, pulse 100, temperature 98.2 F (36.8 C), temperature source Tympanic, resp. rate 20, height 5' 10.5" (1.791 m), weight 200 lb 14.4 oz (91.1 kg), SpO2 100 %.  LABORATORY DATA: Lab Results  Component Value Date   WBC 7.1 01/17/2020   HGB 13.8 01/17/2020   HCT 40.1 01/17/2020   MCV 87.6 01/17/2020   PLT 312 01/17/2020      Chemistry      Component Value Date/Time   NA 137 01/17/2020 0837   K 4.0 01/17/2020 0837   CL 101 01/17/2020 0837   CO2 29 01/17/2020 0837   BUN 17 01/17/2020 0837   CREATININE 0.96 01/17/2020 0837   CREATININE 1.26 11/06/2019 1132      Component Value Date/Time   CALCIUM 9.1 01/17/2020 0837   ALKPHOS 61 01/17/2020 0837   AST 14 (L) 01/17/2020 0837   ALT 12 01/17/2020 0837   BILITOT 0.9 01/17/2020 0837       RADIOGRAPHIC STUDIES: MR BRAIN W WO CONTRAST  Result Date: 01/25/2020 CLINICAL DATA:  59 year old male recently diagnosed with non-small cell lung cancer. Staging. EXAM: MRI HEAD WITHOUT AND WITH CONTRAST TECHNIQUE: Multiplanar, multiecho pulse sequences of the brain and surrounding structures were obtained without  and with intravenous contrast. CONTRAST:  1m GADAVIST GADOBUTROL 1 MMOL/ML IV SOLN COMPARISON:  PET-CT 01/16/2020.  Cervical spine MRI 01/04/2020. FINDINGS: Brain: No midline shift, mass effect, or evidence of intracranial mass lesion. No abnormal enhancement identified. No dural thickening identified. No restricted diffusion to suggest acute infarction. No ventriculomegaly, extra-axial collection or acute intracranial hemorrhage. Cervicomedullary junction and pituitary are within normal limits. GPearline Cablesand white matter signal is within normal limits for age throughout the brain; minimal nonspecific scattered white matter T2 and FLAIR  hyperintensity. No cortical encephalomalacia or chronic cerebral blood products. Vascular: Major intracranial vascular flow voids are preserved, the left vertebral artery appears dominant as on the cervical spine study last month. The left transverse and sigmoid sinuses appear dominant. The left IJ bulb is patent. However, the right IJ bulb appears occluded and the right sigmoid sinus appears atretic (series 16, image 33). The left IJ appear dominant on the cervical MRI last month. Skull and upper cervical spine: Visualized bone marrow signal is within normal limits. Negative visible cervical spine and spinal cord. Sinuses/Orbits: Negative orbits. Mucosal thickening and inspissated material throughout the right maxillary sinus. Small left maxillary sinus mucous retention cysts. Mild retained secretions in the nasal cavity. Mild ethmoid and frontal sinus mucosal thickening. Other: Mastoids are well pneumatized. Visible scalp and face appear negative. IMPRESSION: 1. No metastatic disease or acute intracranial abnormality identified. 2. 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 The left internal jugular vein appeared dominant on the cervical MRI last month. 3. Right maxillary and other lesser bilateral paranasal sinus disease. Electronically Signed   By: HGenevie AnnM.D.   On: 01/25/2020 19:53   NM PET Image Initial (PI) Skull Base To Thigh  Result Date: 01/16/2020 CLINICAL DATA:  Initial treatment strategy for perihilar right lung non-small cell carcinoma. EXAM: NUCLEAR MEDICINE PET SKULL BASE TO THIGH TECHNIQUE: 9.8 mCi F-18 FDG was injected intravenously. Full-ring PET imaging was performed from the skull base to thigh after the radiotracer. CT data was obtained and used for attenuation correction and anatomic localization. Fasting blood glucose: 95 mg/dl COMPARISON:  12/20/2019 chest CT. FINDINGS: Mediastinal blood pool activity: SUV max 3.0 Liver activity: SUV  max NA NECK: No hypermetabolic lymph nodes in the neck. Incidental CT findings: Near complete right maxillary sinus opacification. CHEST: Hypermetabolic spiculated thick-walled cavitary right upper perihilar 3.4 x 3.1 cm lung mass with max SUV 20.8 (series 8/image 26). Mildly hypermetabolic ground-glass 1.2 cm right middle lobe pulmonary nodule with max SUV 3.1 (series 8/image 38). Low level FDG uptake associated with patchy indistinct ground-glass opacities throughout left upper lobe, for example measuring 1.4 cm with max SUV 2.1 in the left upper lobe (series 8/image 34). No enlarged or hypermetabolic axillary, mediastinal or left hilar lymph nodes. Incidental CT findings: Moderate centrilobular emphysema. Diffuse bronchial wall thickening. Two scattered small solid pulmonary nodules in the lungs bilaterally, largest 0.5 cm in the posterior left lower lobe pulmonary nodule, below PET resolution, demonstrating no significant uptake. ABDOMEN/PELVIS: No abnormal hypermetabolic activity within the liver, pancreas, adrenal glands, or spleen. No hypermetabolic lymph nodes in the abdomen or pelvis. Heterogeneous nonfocal prostate hypermetabolism with max SUV 6.2. Incidental CT findings: Atherosclerotic nonaneurysmal abdominal aorta. SKELETON: No focal hypermetabolic activity to suggest skeletal metastasis. Incidental CT findings: none IMPRESSION: 1. Intensely hypermetabolic (max SUV 216.3 spiculated thick-walled cavitary 3.4 cm perihilar right upper lung mass compatible with known primary bronchogenic carcinoma. 2. Low level hypermetabolism associated with 1.2  cm ground-glass right middle lobe pulmonary nodule and patchy indistinct left upper lobe ground-glass opacities, equivocal for inflammatory or metastatic disease. Two 0.5 cm solid pulmonary nodules, below PET resolution. Suggest attention on follow-up chest CT in 3 months. 3. No hypermetabolic thoracic adenopathy or extrathoracic or skeletal metastatic disease. 4.  Nonspecific nonfocal heterogeneous prostate hypermetabolism. Suggest correlation with serum PSA. 5. Aortic Atherosclerosis (ICD10-I70.0) and Emphysema (ICD10-J43.9). Electronically Signed   By: Ilona Sorrel M.D.   On: 01/16/2020 11:07   DG Chest Port 1 View  Result Date: 01/07/2020 CLINICAL DATA:  59 year old male status post bronchoscopy, endobronchial ultrasound and biopsy of cavitary right upper lobe perihilar mass. EXAM: PORTABLE CHEST 1 VIEW COMPARISON:  Chest CT 12/20/2019 and earlier. FINDINGS: Portable AP upright view at 1523 hours. Stable lung volumes and mediastinal contours. No pneumothorax or pleural effusion. The left lung appears stable. There is vague new veiling perihilar density in the right lung, which could be sequelae of bronchoalveolar lavage or similar. Otherwise stable right perihilar and lung markings. Visualized tracheal air column is within normal limits. Negative visible bowel gas pattern. No acute osseous abnormality identified. IMPRESSION: No pneumothorax or adverse features status post bronchoscopy, bronchoscopic biopsy. Electronically Signed   By: Genevie Ann M.D.   On: 01/07/2020 15:49   DG C-ARM BRONCHOSCOPY  Result Date: 01/07/2020 C-ARM BRONCHOSCOPY: Fluoroscopy was utilized by the requesting physician.  No radiographic interpretation.    ASSESSMENT AND PLAN: This is a very pleasant 59 years old white male recently diagnosed with a stage IIb (T3, N0, M0) non-small cell lung cancer, adenocarcinoma presented with perihilar right upper lobe lung mass with suspicious groundglass opacity in the right middle lobe. The patient is not a good surgical candidate for resection according to Dr. Roxan Hockey. I had a lengthy discussion with the patient and his wife today about his current condition and treatment options.  His brain MRI showed no concerning findings for brain metastasis. I recommended for the patient a course of concurrent chemoradiation with weekly carboplatin for AUC  of 2 and paclitaxel 45 mg/M2.  I discussed with the patient the adverse effect of this treatment including but not limited to alopecia, myelosuppression, nausea and vomiting, peripheral neuropathy, liver or renal dysfunction. I will refer the patient to radiation oncology for evaluation and discussion of the radiotherapy option. He is expected to start the first cycle of his treatment on February 10, 2020. I will arrange for the patient to have a chemotherapy education class before the first dose of his treatment. He will come back for follow-up visit in around 2 weeks for evaluation and management of any adverse effect of his treatment. I will call his pharmacy with prescription for Compazine 10 mg p.o. every 6 hours as needed for nausea. The patient was advised to call immediately if he has any concerning symptoms in the interval. The patient voices understanding of current disease status and treatment options and is in agreement with the current care plan.  All questions were answered. The patient knows to call the clinic with any problems, questions or concerns. We can certainly see the patient much sooner if necessary.  The total time spent in the appointment was 40 minutes.  Disclaimer: This note was dictated with voice recognition software. Similar sounding words can inadvertently be transcribed and may not be corrected upon review.

## 2020-01-31 ENCOUNTER — Telehealth: Payer: Self-pay | Admitting: *Deleted

## 2020-01-31 NOTE — Telephone Encounter (Signed)
LVM for call back to schedule appointment with Dr. Sondra Come

## 2020-02-03 ENCOUNTER — Telehealth: Payer: Self-pay | Admitting: Internal Medicine

## 2020-02-03 ENCOUNTER — Encounter: Payer: Self-pay | Admitting: *Deleted

## 2020-02-03 ENCOUNTER — Telehealth: Payer: Self-pay | Admitting: *Deleted

## 2020-02-03 NOTE — Telephone Encounter (Signed)
LVM for call back to schedule appointment with Dr. Sondra Come.

## 2020-02-03 NOTE — Telephone Encounter (Signed)
Scheduled per los. Called and spoke with patient. Went over all appts.

## 2020-02-03 NOTE — Telephone Encounter (Signed)
I message scheduling to check on MR. Hiscox's schedule. I was notified he was confused about plan of care. I explained and clarified questions.  He would like to get scheduled asap.  I reached out to med onc and rad onc scheduling to get this resolved.

## 2020-02-03 NOTE — Progress Notes (Signed)
I followed up on Richard Li schedule. He is not set up for treatment yet. I reached out to schedule with an update.

## 2020-02-04 ENCOUNTER — Ambulatory Visit: Payer: BC Managed Care – PPO | Admitting: Emergency Medicine

## 2020-02-04 NOTE — Progress Notes (Signed)
Pharmacist Chemotherapy Monitoring - Initial Assessment    Anticipated start date: 02/10/20   Regimen:  . Are orders appropriate based on the patient's diagnosis, regimen, and cycle? Yes . Does the plan date match the patient's scheduled date? Yes . Is the sequencing of drugs appropriate? Yes . Are the premedications appropriate for the patient's regimen? Yes . Prior Authorization for treatment is: Not Started o If applicable, is the correct biosimilar selected based on the patient's insurance? not applicable  Organ Function and Labs: [name] Are dose adjustments needed based on the patient's renal function, hepatic function, or hematologic function? No . Are appropriate labs ordered prior to the start of patient's treatment? Yes . Other organ system assessment, if indicated: N/A . The following baseline labs, if indicated, have been ordered: N/A  Dose Assessment: . Are the drug doses appropriate? Yes . Are the following correct: o Drug concentrations Yes o IV fluid compatible with drug Yes o Administration routes Yes o Timing of therapy Yes . If applicable, does the patient have documented access for treatment and/or plans for port-a-cath placement? no . If applicable, have lifetime cumulative doses been properly documented and assessed? yes Lifetime Dose Tracking  No doses have been documented on this patient for the following tracked chemicals: Doxorubicin, Epirubicin, Idarubicin, Daunorubicin, Mitoxantrone, Bleomycin, Oxaliplatin, Carboplatin, Liposomal Doxorubicin  o   Toxicity Monitoring/Prevention: . The patient has the following take home antiemetics prescribed: Prochlorperazine . The patient has the following take home medications prescribed: N/A . Medication allergies and previous infusion related reactions, if applicable, have been reviewed and addressed. Yes . The patient's current medication list has been assessed for drug-drug interactions with their chemotherapy regimen.  no significant drug-drug interactions were identified on review.  Order Review: . Are the treatment plan orders signed? Yes . Is the patient scheduled to see a provider prior to their treatment? No  I verify that I have reviewed each item in the above checklist and answered each question accordingly.   Kennith Center, Pharm.D., CPP 02/04/2020@1 :40 PM

## 2020-02-06 ENCOUNTER — Encounter: Payer: BC Managed Care – PPO | Admitting: Thoracic Surgery (Cardiothoracic Vascular Surgery)

## 2020-02-06 LAB — FUNGUS CULTURE WITH STAIN

## 2020-02-06 LAB — FUNGAL ORGANISM REFLEX

## 2020-02-06 LAB — FUNGUS CULTURE RESULT

## 2020-02-07 ENCOUNTER — Other Ambulatory Visit: Payer: Self-pay

## 2020-02-07 ENCOUNTER — Encounter: Payer: Self-pay | Admitting: *Deleted

## 2020-02-07 ENCOUNTER — Inpatient Hospital Stay: Payer: BC Managed Care – PPO

## 2020-02-07 ENCOUNTER — Encounter: Payer: Self-pay | Admitting: Internal Medicine

## 2020-02-07 NOTE — Progress Notes (Signed)
I received a message from Dr. Sondra Come.  He is able to see Mr. Stangl next week on Tuesday.  I notified rad onc scheduling team to call patient and schedule with Dr. Sondra Come. I also updated Cassie PA-C.

## 2020-02-07 NOTE — Progress Notes (Signed)
Met with patient at registration to introduce myself as Financial Resource Specialist and to offer available resources.  Discussed one-time $1000 [employer] grant and qualifications to assist with personal expenses while going through treatment.  Gave him my card if interested in applying and for any additional financial questions or concerns.      

## 2020-02-10 ENCOUNTER — Telehealth: Payer: Self-pay | Admitting: Radiation Oncology

## 2020-02-10 ENCOUNTER — Encounter: Payer: Self-pay | Admitting: *Deleted

## 2020-02-10 ENCOUNTER — Other Ambulatory Visit: Payer: Self-pay

## 2020-02-10 ENCOUNTER — Inpatient Hospital Stay: Payer: BC Managed Care – PPO

## 2020-02-10 VITALS — BP 135/69 | HR 55 | Temp 98.1°F | Resp 16

## 2020-02-10 DIAGNOSIS — Z23 Encounter for immunization: Secondary | ICD-10-CM

## 2020-02-10 DIAGNOSIS — C3491 Malignant neoplasm of unspecified part of right bronchus or lung: Secondary | ICD-10-CM

## 2020-02-10 DIAGNOSIS — C3411 Malignant neoplasm of upper lobe, right bronchus or lung: Secondary | ICD-10-CM | POA: Diagnosis not present

## 2020-02-10 LAB — CBC WITH DIFFERENTIAL (CANCER CENTER ONLY)
Abs Immature Granulocytes: 0.01 10*3/uL (ref 0.00–0.07)
Basophils Absolute: 0 10*3/uL (ref 0.0–0.1)
Basophils Relative: 1 %
Eosinophils Absolute: 0.1 10*3/uL (ref 0.0–0.5)
Eosinophils Relative: 3 %
HCT: 40.4 % (ref 39.0–52.0)
Hemoglobin: 13.7 g/dL (ref 13.0–17.0)
Immature Granulocytes: 0 %
Lymphocytes Relative: 25 %
Lymphs Abs: 1.3 10*3/uL (ref 0.7–4.0)
MCH: 30 pg (ref 26.0–34.0)
MCHC: 33.9 g/dL (ref 30.0–36.0)
MCV: 88.6 fL (ref 80.0–100.0)
Monocytes Absolute: 0.6 10*3/uL (ref 0.1–1.0)
Monocytes Relative: 11 %
Neutro Abs: 3.2 10*3/uL (ref 1.7–7.7)
Neutrophils Relative %: 60 %
Platelet Count: 194 10*3/uL (ref 150–400)
RBC: 4.56 MIL/uL (ref 4.22–5.81)
RDW: 14.2 % (ref 11.5–15.5)
WBC Count: 5.3 10*3/uL (ref 4.0–10.5)
nRBC: 0 % (ref 0.0–0.2)

## 2020-02-10 LAB — CMP (CANCER CENTER ONLY)
ALT: 8 U/L (ref 0–44)
AST: 11 U/L — ABNORMAL LOW (ref 15–41)
Albumin: 3.3 g/dL — ABNORMAL LOW (ref 3.5–5.0)
Alkaline Phosphatase: 63 U/L (ref 38–126)
Anion gap: 7 (ref 5–15)
BUN: 10 mg/dL (ref 6–20)
CO2: 25 mmol/L (ref 22–32)
Calcium: 8.8 mg/dL — ABNORMAL LOW (ref 8.9–10.3)
Chloride: 104 mmol/L (ref 98–111)
Creatinine: 0.95 mg/dL (ref 0.61–1.24)
GFR, Estimated: >60 mL/min (ref >60)
Glucose, Bld: 82 mg/dL (ref 70–99)
Potassium: 3.7 mmol/L (ref 3.5–5.1)
Sodium: 136 mmol/L (ref 135–145)
Total Bilirubin: 0.5 mg/dL (ref 0.3–1.2)
Total Protein: 6.3 g/dL — ABNORMAL LOW (ref 6.5–8.1)

## 2020-02-10 MED ORDER — FAMOTIDINE IN NACL 20-0.9 MG/50ML-% IV SOLN
INTRAVENOUS | Status: AC
  Filled 2020-02-10: qty 50

## 2020-02-10 MED ORDER — PALONOSETRON HCL INJECTION 0.25 MG/5ML
0.2500 mg | Freq: Once | INTRAVENOUS | Status: AC
  Administered 2020-02-10: 0.25 mg via INTRAVENOUS

## 2020-02-10 MED ORDER — DIPHENHYDRAMINE HCL 50 MG/ML IJ SOLN
50.0000 mg | Freq: Once | INTRAMUSCULAR | Status: AC
  Administered 2020-02-10: 50 mg via INTRAVENOUS

## 2020-02-10 MED ORDER — DIPHENHYDRAMINE HCL 50 MG/ML IJ SOLN
INTRAMUSCULAR | Status: AC
  Filled 2020-02-10: qty 1

## 2020-02-10 MED ORDER — SODIUM CHLORIDE 0.9 % IV SOLN
45.0000 mg/m2 | Freq: Once | INTRAVENOUS | Status: AC
  Administered 2020-02-10: 96 mg via INTRAVENOUS
  Filled 2020-02-10: qty 16

## 2020-02-10 MED ORDER — INFLUENZA VAC SPLIT QUAD 0.5 ML IM SUSY
PREFILLED_SYRINGE | INTRAMUSCULAR | Status: AC
  Filled 2020-02-10: qty 0.5

## 2020-02-10 MED ORDER — PALONOSETRON HCL INJECTION 0.25 MG/5ML
INTRAVENOUS | Status: AC
  Filled 2020-02-10: qty 5

## 2020-02-10 MED ORDER — SODIUM CHLORIDE 0.9 % IV SOLN
Freq: Once | INTRAVENOUS | Status: AC
  Filled 2020-02-10: qty 250

## 2020-02-10 MED ORDER — FAMOTIDINE IN NACL 20-0.9 MG/50ML-% IV SOLN
20.0000 mg | Freq: Once | INTRAVENOUS | Status: AC
  Administered 2020-02-10: 20 mg via INTRAVENOUS

## 2020-02-10 MED ORDER — SODIUM CHLORIDE 0.9 % IV SOLN
20.0000 mg | Freq: Once | INTRAVENOUS | Status: AC
  Administered 2020-02-10: 20 mg via INTRAVENOUS
  Filled 2020-02-10: qty 20

## 2020-02-10 MED ORDER — INFLUENZA VAC SPLIT QUAD 0.5 ML IM SUSY
0.5000 mL | PREFILLED_SYRINGE | Freq: Once | INTRAMUSCULAR | Status: AC
  Administered 2020-02-10: 0.5 mL via INTRAMUSCULAR

## 2020-02-10 MED ORDER — SODIUM CHLORIDE 0.9 % IV SOLN
263.6000 mg | Freq: Once | INTRAVENOUS | Status: AC
  Administered 2020-02-10: 260 mg via INTRAVENOUS
  Filled 2020-02-10: qty 26

## 2020-02-10 NOTE — Telephone Encounter (Signed)
I spoke with Mr. Ducharme wife, Santiago Glad, to confirm his new appointment with Dr. Sondra Come tomorrow, Oct. 19 at 8:30a.

## 2020-02-10 NOTE — Progress Notes (Signed)
Error

## 2020-02-10 NOTE — Patient Instructions (Signed)
[facility] Discharge Instructions for Patients Receiving Chemotherapy  Today you received the following chemotherapy agents paclitaxel, carboplatin   To help prevent nausea and vomiting after your treatment, we encourage you to take your nausea medication as directed.    If you develop nausea and vomiting that is not controlled by your nausea medication, call the clinic.   BELOW ARE SYMPTOMS THAT SHOULD BE REPORTED IMMEDIATELY:  *FEVER GREATER THAN 100.5 F  *CHILLS WITH OR WITHOUT FEVER  NAUSEA AND VOMITING THAT IS NOT CONTROLLED WITH YOUR NAUSEA MEDICATION  *UNUSUAL SHORTNESS OF BREATH  *UNUSUAL BRUISING OR BLEEDING  TENDERNESS IN MOUTH AND THROAT WITH OR WITHOUT PRESENCE OF ULCERS  *URINARY PROBLEMS  *BOWEL PROBLEMS  UNUSUAL RASH Items with * indicate a potential emergency and should be followed up as soon as possible.  Feel free to call the clinic should you have any questions or concerns. The clinic phone number is [phone].  Please show the [facility] at check-in to the Emergency Department and triage nurse.  Paclitaxel injection What is this medicine? PACLITAXEL (PAK li TAX el) is a chemotherapy drug. It targets fast dividing cells, like cancer cells, and causes these cells to die. This medicine is used to treat ovarian cancer, breast cancer, lung cancer, Kaposi's sarcoma, and other cancers. This medicine may be used for other purposes; ask your health care provider or pharmacist if you have questions. COMMON BRAND NAME(S): Onxol, Taxol What should I tell my health care provider before I take this medicine? They need to know if you have any of these conditions:  history of irregular heartbeat  liver disease  low blood counts, like low white cell, platelet, or red cell counts  lung or breathing disease, like asthma  tingling of the fingers or toes, or other nerve disorder  an unusual or allergic reaction to paclitaxel, alcohol,  polyoxyethylated castor oil, other chemotherapy, other medicines, foods, dyes, or preservatives  pregnant or trying to get pregnant  breast-feeding How should I use this medicine? This drug is given as an infusion into a vein. It is administered in a hospital or clinic by a specially trained health care professional. Talk to your pediatrician regarding the use of this medicine in children. Special care may be needed. Overdosage: If you think you have taken too much of this medicine contact a poison control center or emergency room at once. NOTE: This medicine is only for you. Do not share this medicine with others. What if I miss a dose? It is important not to miss your dose. Call your doctor or health care professional if you are unable to keep an appointment. What may interact with this medicine? Do not take this medicine with any of the following medications:  disulfiram  metronidazole This medicine may also interact with the following medications:  antiviral medicines for hepatitis, HIV or AIDS  certain antibiotics like erythromycin and clarithromycin  certain medicines for fungal infections like ketoconazole and itraconazole  certain medicines for seizures like carbamazepine, phenobarbital, phenytoin  gemfibrozil  nefazodone  rifampin  St. John's wort This list may not describe all possible interactions. Give your health care provider a list of all the medicines, herbs, non-prescription drugs, or dietary supplements you use. Also tell them if you smoke, drink alcohol, or use illegal drugs. Some items may interact with your medicine. What should I watch for while using this medicine? Your condition will be monitored carefully while you are receiving this medicine. You will need  important blood work done while you are taking this medicine. This medicine can cause serious allergic reactions. To reduce your risk you will need to take other medicine(s) before treatment with this  medicine. If you experience allergic reactions like skin rash, itching or hives, swelling of the face, lips, or tongue, tell your doctor or health care professional right away. In some cases, you may be given additional medicines to help with side effects. Follow all directions for their use. This drug may make you feel generally unwell. This is not uncommon, as chemotherapy can affect healthy cells as well as cancer cells. Report any side effects. Continue your course of treatment even though you feel ill unless your doctor tells you to stop. Call your doctor or health care professional for advice if you get a fever, chills or sore throat, or other symptoms of a cold or flu. Do not treat yourself. This drug decreases your body's ability to fight infections. Try to avoid being around people who are sick. This medicine may increase your risk to bruise or bleed. Call your doctor or health care professional if you notice any unusual bleeding. Be careful brushing and flossing your teeth or using a toothpick because you may get an infection or bleed more easily. If you have any dental work done, tell your dentist you are receiving this medicine. Avoid taking products that contain aspirin, acetaminophen, ibuprofen, naproxen, or ketoprofen unless instructed by your doctor. These medicines may hide a fever. Do not become pregnant while taking this medicine. Women should inform their doctor if they wish to become pregnant or think they might be pregnant. There is a potential for serious side effects to an unborn child. Talk to your health care professional or pharmacist for more information. Do not breast-feed an infant while taking this medicine. Men are advised not to father a child while receiving this medicine. This product may contain alcohol. Ask your pharmacist or healthcare provider if this medicine contains alcohol. Be sure to tell all healthcare providers you are taking this medicine. Certain medicines,  like metronidazole and disulfiram, can cause an unpleasant reaction when taken with alcohol. The reaction includes flushing, headache, nausea, vomiting, sweating, and increased thirst. The reaction can last from 30 minutes to several hours. What side effects may I notice from receiving this medicine? Side effects that you should report to your doctor or health care professional as soon as possible:  allergic reactions like skin rash, itching or hives, swelling of the face, lips, or tongue  breathing problems  changes in vision  fast, irregular heartbeat  high or low blood pressure  mouth sores  pain, tingling, numbness in the hands or feet  signs of decreased platelets or bleeding - bruising, pinpoint red spots on the skin, black, tarry stools, blood in the urine  signs of decreased red blood cells - unusually weak or tired, feeling faint or lightheaded, falls  signs of infection - fever or chills, cough, sore throat, pain or difficulty passing urine  signs and symptoms of liver injury like dark yellow or brown urine; general ill feeling or flu-like symptoms; light-colored stools; loss of appetite; nausea; right upper belly pain; unusually weak or tired; yellowing of the eyes or skin  swelling of the ankles, feet, hands  unusually slow heartbeat Side effects that usually do not require medical attention (report to your doctor or health care professional if they continue or are bothersome):  diarrhea  hair loss  loss of appetite  muscle or  joint pain  nausea, vomiting  pain, redness, or irritation at site where injected  tiredness This list may not describe all possible side effects. Call your doctor for medical advice about side effects. You may report side effects to FDA at 1-800-FDA-1088. Where should I keep my medicine? This drug is given in a hospital or clinic and will not be stored at home. NOTE: This sheet is a summary. It may not cover all possible information.  If you have questions about this medicine, talk to your doctor, pharmacist, or health care provider.  2020 [employer]/Gold Standard (2016-12-13 13:14:55)  Carboplatin injection What is this medicine? CARBOPLATIN (KAR boe pla tin) is a chemotherapy drug. It targets fast dividing cells, like cancer cells, and causes these cells to die. This medicine is used to treat ovarian cancer and many other cancers. This medicine may be used for other purposes; ask your health care provider or pharmacist if you have questions. COMMON BRAND NAME(S): Paraplatin What should I tell my health care provider before I take this medicine? They need to know if you have any of these conditions:  blood disorders  hearing problems  kidney disease  recent or ongoing radiation therapy  an unusual or allergic reaction to carboplatin, cisplatin, other chemotherapy, other medicines, foods, dyes, or preservatives  pregnant or trying to get pregnant  breast-feeding How should I use this medicine? This drug is usually given as an infusion into a vein. It is administered in a hospital or clinic by a specially trained health care professional. Talk to your pediatrician regarding the use of this medicine in children. Special care may be needed. Overdosage: If you think you have taken too much of this medicine contact a poison control center or emergency room at once. NOTE: This medicine is only for you. Do not share this medicine with others. What if I miss a dose? It is important not to miss a dose. Call your doctor or health care professional if you are unable to keep an appointment. What may interact with this medicine?  medicines for seizures  medicines to increase blood counts like filgrastim, pegfilgrastim, sargramostim  some antibiotics like amikacin, gentamicin, neomycin, streptomycin, tobramycin  vaccines Talk to your doctor or health care professional before taking any of these  medicines:  acetaminophen  aspirin  ibuprofen  ketoprofen  naproxen This list may not describe all possible interactions. Give your health care provider a list of all the medicines, herbs, non-prescription drugs, or dietary supplements you use. Also tell them if you smoke, drink alcohol, or use illegal drugs. Some items may interact with your medicine. What should I watch for while using this medicine? Your condition will be monitored carefully while you are receiving this medicine. You will need important blood work done while you are taking this medicine. This drug may make you feel generally unwell. This is not uncommon, as chemotherapy can affect healthy cells as well as cancer cells. Report any side effects. Continue your course of treatment even though you feel ill unless your doctor tells you to stop. In some cases, you may be given additional medicines to help with side effects. Follow all directions for their use. Call your doctor or health care professional for advice if you get a fever, chills or sore throat, or other symptoms of a cold or flu. Do not treat yourself. This drug decreases your body's ability to fight infections. Try to avoid being around people who are sick. This medicine may increase your risk to  bruise or bleed. Call your doctor or health care professional if you notice any unusual bleeding. Be careful brushing and flossing your teeth or using a toothpick because you may get an infection or bleed more easily. If you have any dental work done, tell your dentist you are receiving this medicine. Avoid taking products that contain aspirin, acetaminophen, ibuprofen, naproxen, or ketoprofen unless instructed by your doctor. These medicines may hide a fever. Do not become pregnant while taking this medicine. Women should inform their doctor if they wish to become pregnant or think they might be pregnant. There is a potential for serious side effects to an unborn child. Talk  to your health care professional or pharmacist for more information. Do not breast-feed an infant while taking this medicine. What side effects may I notice from receiving this medicine? Side effects that you should report to your doctor or health care professional as soon as possible:  allergic reactions like skin rash, itching or hives, swelling of the face, lips, or tongue  signs of infection - fever or chills, cough, sore throat, pain or difficulty passing urine  signs of decreased platelets or bleeding - bruising, pinpoint red spots on the skin, black, tarry stools, nosebleeds  signs of decreased red blood cells - unusually weak or tired, fainting spells, lightheadedness  breathing problems  changes in hearing  changes in vision  chest pain  high blood pressure  low blood counts - This drug may decrease the number of white blood cells, red blood cells and platelets. You may be at increased risk for infections and bleeding.  nausea and vomiting  pain, swelling, redness or irritation at the injection site  pain, tingling, numbness in the hands or feet  problems with balance, talking, walking  trouble passing urine or change in the amount of urine Side effects that usually do not require medical attention (report to your doctor or health care professional if they continue or are bothersome):  hair loss  loss of appetite  metallic taste in the mouth or changes in taste This list may not describe all possible side effects. Call your doctor for medical advice about side effects. You may report side effects to FDA at 1-800-FDA-1088. Where should I keep my medicine? This drug is given in a hospital or clinic and will not be stored at home. NOTE: This sheet is a summary. It may not cover all possible information. If you have questions about this medicine, talk to your doctor, pharmacist, or health care provider.  2020 [employer]vier/Gold Standard (2007-07-17 14:38:05)

## 2020-02-10 NOTE — Progress Notes (Signed)
Radiation Oncology         [phone] ________________________________  Initial Outpatient Consultation  Name: Richard Li MRN: [id number]  Date: 02/11/2020  DOB: 1960-09-10  OE:UMPNTIRW, Einar Pheasant, DO  Curt Bears, MD   REFERRING PHYSICIAN: Curt Bears, MD  DIAGNOSIS: The encounter diagnosis was Adenocarcinoma of right lung, stage 2 ([place]).  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 male who is seen as a courtesy of Dr. Julien Nordmann for an opinion concerning radiation therapy as part of management for his recently diagnosed lung cancer.  The patient presented to Dr. Madilyn Fireman, PCP, on 12/19/2019 with complaints of chest congestion and shortness of breath. Chest x-ray at that time showed artifact versus developing atypical infiltrate in the right mid lung field. The patient underwent a chest CT scan on 12/20/2019 that showed a central right perihilar cavitary lesion that measured 3.2 x 2.7 x 2.5 cm. The margins were irregular and thick walled, there was mass effect on the adjacent right upper lobe bronchus, and tenting of the trachea. Findings were highly suspicious for cavitary malignancy. It also showed a non-specific 1 mm ground-glass nodule in the right middle lobe with small subpleural solid nodules in the right middle and right upper lobe. Finally, there was mild pleural thickening in the dependent right hemithorax with pleural calcification, possibly related to scarring, and moderate emphysema.  Given the above findings, the patient underwent a video bronchoscopy with endobronchial ultrasound and electromagnetic navigation on 01/07/2020 under the care of Dr. Lamonte Sakai. Pathology from the procedure revealed moderately to poorly differentiated adenocarcinoma of the right upper lobe. Right middle lobe showed benign lung parenchyma and was negative for carcinoma. Atypical cells were present in the fine needle  aspiration of the right upper lobe mass and malignant cells consistent with adenocarcinoma were present in the right upper lobe endobronchial brushings. Station 7 lymph node, right middle lobe brushing, and right middle lobe fine needle aspiration were all negative for malignancy.  PET scan on 01/16/2020 showed an intensely hypermetabolic, spiculated, thick-walled cavitary in the perihilar right upper lung that measured 3.4 cm and was compatible with known primary bronchogenic carcinoma. There was also noted to be low level hypermetabolism associated with a 1.2 cm ground-glass right middle lobe pulmonary nodule and patchy indistinct left upper lobe ground-glass opacities, equivocal for inflammatory or metastatic disease. Additionally, there were two 0.5 cm solid pulmonary nodules that were below PET resolution. Finally, there was non-specific non-focal heterogeneous prostate hypermetabolism. There was no hypermetabolic thoracic adenopathy, extrathoracic, or skeletal metastatic disease.   MRI of brain on 01/25/2020 showed chronically atretic and occluded appearance of the right sigmoid sinus and right IJ bulb and right maxillary and other lesser bilateral paranasal sinus disease. There was no metastatic disease or acute intracranial abnormality identified.   The patient was referred to Dr. Julien Nordmann and was last seen on 01/30/2020. At that time, it was recommended that he proceed with concurrent chemoradiation with weekly Carboplatin and Paclitaxel to begin yesterday, 02/10/2020.  Patient was seen by Dr. Roxan Hockey.  Given the location of the lesion he was not felt to be a candidate for surgery initially but with tumor shrinkage he may be a potential candidate at a later date.  PREVIOUS RADIATION THERAPY: No  PAST MEDICAL HISTORY:  Past Medical History:  Diagnosis Date  . Allergic rhinitis, cause unspecified   . Anxiety state, unspecified   . Asthma    as a  child  . Chronic airway obstruction, not  elsewhere classified   . Esophageal reflux   . History of kidney stones   . Hypertension   . Lumbago   . Other and unspecified hyperlipidemia   . Other chest pain     PAST SURGICAL HISTORY: Past Surgical History:  Procedure Laterality Date  . APPENDECTOMY    . BRONCHIAL BIOPSY  01/07/2020   Procedure: BRONCHIAL BIOPSIES;  Surgeon: Collene Gobble, MD;  Location: [facility] ENDOSCOPY;  Service: Pulmonary;;  . BRONCHIAL BRUSHINGS  01/07/2020   Procedure: BRONCHIAL BRUSHINGS;  Surgeon: Collene Gobble, MD;  Location: [facility] ENDOSCOPY;  Service: Pulmonary;;  . BRONCHIAL NEEDLE ASPIRATION BIOPSY  01/07/2020   Procedure: BRONCHIAL NEEDLE ASPIRATION BIOPSIES;  Surgeon: Collene Gobble, MD;  Location: [facility] ENDOSCOPY;  Service: Pulmonary;;  . BRONCHIAL WASHINGS  01/07/2020   Procedure: BRONCHIAL WASHINGS;  Surgeon: Collene Gobble, MD;  Location: [facility] ENDOSCOPY;  Service: Pulmonary;;  . COLONOSCOPY  15 years ago  . HEMOSTASIS CONTROL  01/07/2020   Procedure: HEMOSTASIS CONTROL;  Surgeon: Collene Gobble, MD;  Location: [facility] ENDOSCOPY;  Service: Pulmonary;;  cold saline  . NASAL TURBINATE REDUCTION  2002   Dr.Crossley  . VASECTOMY    . VIDEO BRONCHOSCOPY WITH ENDOBRONCHIAL NAVIGATION N/A 01/07/2020   Procedure: VIDEO BRONCHOSCOPY WITH ENDOBRONCHIAL NAVIGATION;  Surgeon: Collene Gobble, MD;  Location: [place] ENDOSCOPY;  Service: Pulmonary;  Laterality: N/A;  . VIDEO BRONCHOSCOPY WITH ENDOBRONCHIAL ULTRASOUND N/A 01/07/2020   Procedure: VIDEO BRONCHOSCOPY WITH ENDOBRONCHIAL ULTRASOUND;  Surgeon: Collene Gobble, MD;  Location: [facility] ENDOSCOPY;  Service: Pulmonary;  Laterality: N/A;    FAMILY HISTORY:  Family History  Problem Relation Age of Onset  . Breast cancer Mother   . Cancer Father   . Heart attack Brother   . Emphysema Maternal Uncle   . COPD Maternal Uncle   . Heart disease Maternal Uncle   . Colon cancer Neg Hx     SOCIAL HISTORY:  Social History   Tobacco Use  . Smoking status: Former Smoker     Packs/day: 2.00    Years: 28.00    Pack years: 56.00    Types: Cigarettes    Quit date: 04/25/2005    Years since quitting: 14.8  . Smokeless tobacco: Never Used  Vaping Use  . Vaping Use: Never used  Substance Use Topics  . Alcohol use: Yes    Alcohol/week: 12.0 standard drinks    Types: 12 Cans of beer per week    Comment: social use  . Drug use: No    ALLERGIES:  No Active Allergies  MEDICATIONS:  Current Outpatient Medications  Medication Sig Dispense Refill  . diclofenac Sodium (VOLTAREN) 1 % GEL Apply 1 application topically 3 (three) times daily as needed (pain/muscle aches.).    [name] DUEXIS 800-26.6 MG TABS Take 1 tablet by mouth 3 (three) times daily as needed (pain.).     [name] fenofibrate 160 MG tablet Take 1 tablet (160 mg total) by mouth daily. 90 tablet 3  . guaiFENesin (MUCINEX) 600 MG 12 hr tablet Take 600 mg by mouth every morning.    . Ipratropium-Albuterol (COMBIVENT RESPIMAT) 20-100 MCG/ACT AERS respimat Inhale 1-2 puffs into the lungs every 6 (six) hours as needed for wheezing. 4 g 3  . LORazepam (ATIVAN) 1 MG tablet TAKE 1 TABLET BY MOUTH THREE TIMES DAILY AS NEEDED FOR ANXIETY 90 tablet 2  . losartan (COZAAR) 100 MG tablet Take 1 tablet (100 mg total) by mouth  daily. 90 tablet 3  . meclizine (ANTIVERT) 25 MG tablet Take 1 tablet (25 mg total) by mouth every 6 (six) hours as needed for dizziness.    . predniSONE (DELTASONE) 20 MG tablet Take 1 tablet (20 mg total) by mouth 2 (two) times daily with a meal. 10 tablet 0  . prochlorperazine (COMPAZINE) 10 MG tablet Take 1 tablet (10 mg total) by mouth every 6 (six) hours as needed for nausea or vomiting. 30 tablet 0  . sildenafil (VIAGRA) 100 MG tablet Take 0.5-1 tablets (50-100 mg total) by mouth daily as needed for erectile dysfunction. 10 tablet 11  . traZODone (DESYREL) 50 MG tablet TAKE 1 TO 2 TABLETS BY MOUTH AT BEDTIME AS NEEDED FOR SLEEP 45 tablet 5   No current facility-administered medications for this  encounter.    REVIEW OF SYSTEMS:  A 10+ POINT REVIEW OF SYSTEMS WAS OBTAINED including neurology, dermatology, psychiatry, cardiac, respiratory, lymph, extremities, GI, GU, musculoskeletal, constitutional, reproductive, HEENT.  He denies any pain within the chest area significant cough or hemoptysis.  He denies any new bony pain headaches dizziness or blurred vision   PHYSICAL EXAM:  height is 5' 10.5" (1.791 m) and weight is 206 lb (93.4 kg). His temporal temperature is 97 F (36.1 C) (abnormal). His blood pressure is 99/59 (abnormal) and his pulse is 109 (abnormal). His respiration is 18 and oxygen saturation is 100%.   General: Alert and oriented, in no acute distress HEENT: Head is normocephalic. Extraocular movements are intact. Oropharynx is clear. Neck: Neck is supple, no palpable cervical or supraclavicular lymphadenopathy. Heart: Regular in rate and rhythm with no murmurs, rubs, or gallops. Chest: Clear to auscultation bilaterally, with no rhonchi, wheezes, or rales. Abdomen: Soft, nontender, nondistended, with no rigidity or guarding. Extremities: No cyanosis or edema.  Lymphatics: see Neck Exam Skin: No concerning lesions. Musculoskeletal: symmetric strength and muscle tone throughout.  Limited mobility of his right shoulder secondary to being diagnosed with "frozen shoulder" Neurologic: Cranial nerves II through XII are grossly intact. No obvious focalities. Speech is fluent. Coordination is intact. Psychiatric: Judgment and insight are intact. Affect is appropriate.   ECOG = 1  0 - Asymptomatic (Fully active, able to carry on all predisease activities without restriction)  1 - Symptomatic but completely ambulatory (Restricted in physically strenuous activity but ambulatory and able to carry out work of a light or sedentary nature. For example, light housework, office work)  2 - Symptomatic, <50% in bed during the day (Ambulatory and capable of all self care but unable to carry  out any work activities. Up and about more than 50% of waking hours)  3 - Symptomatic, >50% in bed, but not bedbound (Capable of only limited self-care, confined to bed or chair 50% or more of waking hours)  4 - Bedbound (Completely disabled. Cannot carry on any self-care. Totally confined to bed or chair)  5 - Death   Eustace Pen MM, Creech RH, Tormey DC, et al. [phone]). "Toxicity and response criteria of the [facility]". [place] Oncol. 5 (6): 649-55  LABORATORY DATA:  Lab Results  Component Value Date   WBC 5.3 02/10/2020   HGB 13.7 02/10/2020   HCT 40.4 02/10/2020   MCV 88.6 02/10/2020   PLT 194 02/10/2020   NEUTROABS 3.2 02/10/2020   Lab Results  Component Value Date   NA 136 02/10/2020   K 3.7 02/10/2020   CL 104 02/10/2020   CO2 25 02/10/2020   GLUCOSE 82  02/10/2020   CREATININE 0.95 02/10/2020   CALCIUM 8.8 (L) 02/10/2020      RADIOGRAPHY: MR BRAIN W WO CONTRAST  Result Date: 01/25/2020 CLINICAL DATA:  59 year old male recently diagnosed with non-small cell lung cancer. Staging. EXAM: MRI HEAD WITHOUT AND WITH CONTRAST TECHNIQUE: Multiplanar, multiecho pulse sequences of the brain and surrounding structures were obtained without and with intravenous contrast. CONTRAST:  69mL GADAVIST GADOBUTROL 1 MMOL/ML IV SOLN COMPARISON:  PET-CT 01/16/2020.  Cervical spine MRI 01/04/2020. FINDINGS: Brain: No midline shift, mass effect, or evidence of intracranial mass lesion. No abnormal enhancement identified. No dural thickening identified. No restricted diffusion to suggest acute infarction. No ventriculomegaly, extra-axial collection or acute intracranial hemorrhage. Cervicomedullary junction and pituitary are within normal limits. Pearline Cables and white matter signal is within normal limits for age throughout the brain; minimal nonspecific scattered white matter T2 and FLAIR hyperintensity. No cortical encephalomalacia or chronic cerebral blood products. Vascular: Major  intracranial vascular flow voids are preserved, the left vertebral artery appears dominant as on the cervical spine study last month. The left transverse and sigmoid sinuses appear dominant. The left IJ bulb is patent. However, the right IJ bulb appears occluded and the right sigmoid sinus appears atretic (series 16, image 33). The left IJ appear dominant on the cervical MRI last month. Skull and upper cervical spine: Visualized bone marrow signal is within normal limits. Negative visible cervical spine and spinal cord. Sinuses/Orbits: Negative orbits. Mucosal thickening and inspissated material throughout the right maxillary sinus. Small left maxillary sinus mucous retention cysts. Mild retained secretions in the nasal cavity. Mild ethmoid and frontal sinus mucosal thickening. Other: Mastoids are well pneumatized. Visible scalp and face appear negative. IMPRESSION: 1. No metastatic disease or acute intracranial abnormality identified. 2. 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 The left internal jugular vein appeared dominant on the cervical MRI last month. 3. Right maxillary and other lesser bilateral paranasal sinus disease. Electronically Signed   By: Genevie Ann M.D.   On: 01/25/2020 19:53   NM PET Image Initial (PI) Skull Base To Thigh  Result Date: 01/16/2020 CLINICAL DATA:  Initial treatment strategy for perihilar right lung non-small cell carcinoma. EXAM: NUCLEAR MEDICINE PET SKULL BASE TO THIGH TECHNIQUE: 9.8 mCi F-18 FDG was injected intravenously. Full-ring PET imaging was performed from the skull base to thigh after the radiotracer. CT data was obtained and used for attenuation correction and anatomic localization. Fasting blood glucose: 95 mg/dl COMPARISON:  12/20/2019 chest CT. FINDINGS: Mediastinal blood pool activity: SUV max 3.0 Liver activity: SUV max NA NECK: No hypermetabolic lymph nodes in the neck. Incidental CT findings: Near complete right  maxillary sinus opacification. CHEST: Hypermetabolic spiculated thick-walled cavitary right upper perihilar 3.4 x 3.1 cm lung mass with max SUV 20.8 (series 8/image 26). Mildly hypermetabolic ground-glass 1.2 cm right middle lobe pulmonary nodule with max SUV 3.1 (series 8/image 38). Low level FDG uptake associated with patchy indistinct ground-glass opacities throughout left upper lobe, for example measuring 1.4 cm with max SUV 2.1 in the left upper lobe (series 8/image 34). No enlarged or hypermetabolic axillary, mediastinal or left hilar lymph nodes. Incidental CT findings: Moderate centrilobular emphysema. Diffuse bronchial wall thickening. Two scattered small solid pulmonary nodules in the lungs bilaterally, largest 0.5 cm in the posterior left lower lobe pulmonary nodule, below PET resolution, demonstrating no significant uptake. ABDOMEN/PELVIS: No abnormal hypermetabolic activity within the liver, pancreas, adrenal glands, or spleen. No hypermetabolic lymph nodes in the  abdomen or pelvis. Heterogeneous nonfocal prostate hypermetabolism with max SUV 6.2. Incidental CT findings: Atherosclerotic nonaneurysmal abdominal aorta. SKELETON: No focal hypermetabolic activity to suggest skeletal metastasis. Incidental CT findings: none IMPRESSION: 1. Intensely hypermetabolic (max SUV 89.3) spiculated thick-walled cavitary 3.4 cm perihilar right upper lung mass compatible with known primary bronchogenic carcinoma. 2. Low level hypermetabolism associated with 1.2 cm ground-glass right middle lobe pulmonary nodule and patchy indistinct left upper lobe ground-glass opacities, equivocal for inflammatory or metastatic disease. Two 0.5 cm solid pulmonary nodules, below PET resolution. Suggest attention on follow-up chest CT in 3 months. 3. No hypermetabolic thoracic adenopathy or extrathoracic or skeletal metastatic disease. 4. Nonspecific nonfocal heterogeneous prostate hypermetabolism. Suggest correlation with serum PSA. 5.  Aortic Atherosclerosis (ICD10-I70.0) and Emphysema (ICD10-J43.9). Electronically Signed   By: Ilona Sorrel M.D.   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  As above given the location is not felt to be a candidate for surgery at this time but may be a candidate for surgery with tumor shrinkage.  Today, I talked to the patient  about the findings and work-up thus far.  We discussed the natural history of lung cancer and general treatment, highlighting the role of radiotherapy in the management.  We discussed the available radiation techniques, and focused on the details of logistics and delivery.  We reviewed the anticipated acute and late sequelae associated with radiation in this setting.  The patient was encouraged to ask questions that I answered to the best of my ability.  A patient consent form was discussed and signed.  We retained a copy for our records.  The patient would like to proceed with radiation and will be scheduled for CT simulation.  PLAN: Patient will return tomorrow at 8:30 am for CT simulation with treatments to begin October 21 along with his radiosensitizing chemotherapy this week.  Anticipate 5 weeks of radiation therapy (Pre-Op dose) . Total time spent in this encounter was 60 minutes which included reviewing the patient's most recent chest x-ray, chest CT scan, PET scan, brain MRI, bronchoscopy, pathology/cytologist reports, consultations, follow-ups, physical examination, and documentation.  ------------------------------------------------  Blair Promise, PhD, MD  This document serves as a record of services personally performed by Gery Pray, MD. It was created on his behalf by Clerance Lav, a trained medical scribe. The creation of this record is based on the scribe's personal  observations and the provider's statements to them. This document has been checked and approved by the attending provider.

## 2020-02-10 NOTE — Progress Notes (Signed)
I followed up on Richard Li schedule. It showed that his appt with Dr. Bobbe Medico for 10/19 had not been scheduled. I updated rad onc team.  He is now scheduled with Rad Onc and is aware of appts.

## 2020-02-11 ENCOUNTER — Ambulatory Visit: Payer: BC Managed Care – PPO | Admitting: Radiation Oncology

## 2020-02-11 ENCOUNTER — Encounter: Payer: Self-pay | Admitting: Radiation Oncology

## 2020-02-11 ENCOUNTER — Ambulatory Visit: Payer: BC Managed Care – PPO

## 2020-02-11 ENCOUNTER — Encounter: Payer: Self-pay | Admitting: *Deleted

## 2020-02-11 ENCOUNTER — Other Ambulatory Visit: Payer: Self-pay

## 2020-02-11 ENCOUNTER — Ambulatory Visit
Admission: RE | Admit: 2020-02-11 | Discharge: 2020-02-11 | Disposition: A | Discharge status: Home / Self Care | Payer: BC Managed Care – PPO | Source: Ambulatory Visit | Attending: Radiation Oncology | Admitting: Radiation Oncology

## 2020-02-11 DIAGNOSIS — M545 Low back pain, unspecified: Secondary | ICD-10-CM | POA: Diagnosis not present

## 2020-02-11 DIAGNOSIS — Z87891 Personal history of nicotine dependence: Secondary | ICD-10-CM | POA: Insufficient documentation

## 2020-02-11 DIAGNOSIS — Z79899 Other long term (current) drug therapy: Secondary | ICD-10-CM | POA: Diagnosis not present

## 2020-02-11 DIAGNOSIS — J439 Emphysema, unspecified: Secondary | ICD-10-CM | POA: Diagnosis not present

## 2020-02-11 DIAGNOSIS — C3411 Malignant neoplasm of upper lobe, right bronchus or lung: Secondary | ICD-10-CM | POA: Insufficient documentation

## 2020-02-11 DIAGNOSIS — C3491 Malignant neoplasm of unspecified part of right bronchus or lung: Secondary | ICD-10-CM

## 2020-02-11 DIAGNOSIS — Z87442 Personal history of urinary calculi: Secondary | ICD-10-CM | POA: Diagnosis not present

## 2020-02-11 DIAGNOSIS — Z809 Family history of malignant neoplasm, unspecified: Secondary | ICD-10-CM | POA: Insufficient documentation

## 2020-02-11 DIAGNOSIS — Z803 Family history of malignant neoplasm of breast: Secondary | ICD-10-CM | POA: Diagnosis not present

## 2020-02-11 DIAGNOSIS — F419 Anxiety disorder, unspecified: Secondary | ICD-10-CM | POA: Diagnosis not present

## 2020-02-11 DIAGNOSIS — E785 Hyperlipidemia, unspecified: Secondary | ICD-10-CM | POA: Diagnosis not present

## 2020-02-11 DIAGNOSIS — I10 Essential (primary) hypertension: Secondary | ICD-10-CM | POA: Diagnosis not present

## 2020-02-11 DIAGNOSIS — R911 Solitary pulmonary nodule: Secondary | ICD-10-CM

## 2020-02-11 DIAGNOSIS — K219 Gastro-esophageal reflux disease without esophagitis: Secondary | ICD-10-CM | POA: Insufficient documentation

## 2020-02-11 DIAGNOSIS — I700 Atherosclerosis of aorta: Secondary | ICD-10-CM | POA: Insufficient documentation

## 2020-02-11 NOTE — Progress Notes (Signed)
Patient here for a consult with Dr. Sondra Come.  Thoracic Location of Tumor / Histology: Right upper lobe  Patient presented 2 months ago with symptoms of: chest congestion and shortness of breath.  Biopsies of right upper lobe revealed moderate to poorly differentiated adenocarcinoma.  Tobacco/Marijuana/Snuff/ETOH use: 2 PPD for 28 years.  Past/Anticipated interventions by cardiothoracic surgery, if any:   Past/Anticipated interventions by medical oncology, if any: Chemotherapy  Signs/Symptoms  Weight changes, if any: lost 40 lbs by choice April-Oct.2021                  Respiratory complaints, if any:  frequent cough at night., shortness of breath with exertion.    Hemoptysis, if any: whiteish yellow mucus with occ streaks of blood    Pain issues, if any:  none  SAFETY ISSUES:  Prior radiation? none  Pacemaker/ICD?  none  Possible current pregnancy? n/a  Is the patient on methotrexate? no  Current Complaints / other details:    BP (!) 99/59 (BP Location: Left Arm, Patient Position: Sitting)   Pulse (!) 109   Temp (!) 97 F (36.1 C) (Temporal)   Resp 18   Ht 5' 10.5" (1.791 m)   Wt 206 lb (93.4 kg)   SpO2 100%   BMI 29.14 kg/m   Wt Readings from Last 3 Encounters:  02/11/20 206 lb (93.4 kg)  01/30/20 200 lb 14.4 oz (91.1 kg)  01/27/20 199 lb 6.4 oz (90.4 kg)

## 2020-02-11 NOTE — Progress Notes (Signed)
[facility] Psychosocial Distress Screening Clinical Social Work  Clinical Social Work was referred by distress screening protocol.  The patient scored a 6 on the Psychosocial Distress Thermometer which indicates moderate distress. Patient declined Clinical Social Work contact at time time.  Please contact CSW if social work needs are identified.     ONCBCN DISTRESS SCREENING 02/11/2020  Screening Type Initial Screening  Distress experienced in past week (1-10) 6  Referral to clinical social work No  Other declined social work referral    Johnnye Lana, MSW, [employer], OSW-C Clinical Social Worker [employer] [phone]

## 2020-02-12 ENCOUNTER — Other Ambulatory Visit: Payer: Self-pay

## 2020-02-12 ENCOUNTER — Encounter: Payer: Self-pay | Admitting: Radiation Oncology

## 2020-02-12 ENCOUNTER — Ambulatory Visit
Admission: RE | Admit: 2020-02-12 | Discharge: 2020-02-12 | Disposition: A | Discharge status: Home / Self Care | Payer: BC Managed Care – PPO | Source: Ambulatory Visit | Attending: Radiation Oncology | Admitting: Radiation Oncology

## 2020-02-12 DIAGNOSIS — C3411 Malignant neoplasm of upper lobe, right bronchus or lung: Secondary | ICD-10-CM | POA: Insufficient documentation

## 2020-02-12 DIAGNOSIS — Z510 Encounter for antineoplastic radiation therapy: Secondary | ICD-10-CM | POA: Diagnosis not present

## 2020-02-12 DIAGNOSIS — C3491 Malignant neoplasm of unspecified part of right bronchus or lung: Secondary | ICD-10-CM

## 2020-02-12 NOTE — Progress Notes (Signed)
Radiation Oncology         [phone] ________________________________  Name: Richard Li MRN: [id number]  Date: 02/12/2020  DOB: 1960/11/27  Follow-Up Visit Note  CC: [name], DO  Curt Bears, MD    ICD-10-CM   1. 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  Prior to his simulation the reported some minimal amounts of hemoptysis.  Recommend that he follow this closely and to report if this continues to be more significant.  He has started chemotherapy this week and will start his radiation therapy tomorrow.                        ALLERGIES:  has no active allergies.  Meds: Current Outpatient Medications  Medication Sig Dispense Refill  . diclofenac Sodium (VOLTAREN) 1 % GEL Apply 1 application topically 3 (three) times daily as needed (pain/muscle aches.).    [name] DUEXIS 800-26.6 MG TABS Take 1 tablet by mouth 3 (three) times daily as needed (pain.).     [name] fenofibrate 160 MG tablet Take 1 tablet (160 mg total) by mouth daily. 90 tablet 3  . guaiFENesin (MUCINEX) 600 MG 12 hr tablet Take 600 mg by mouth every morning.    . Ipratropium-Albuterol (COMBIVENT RESPIMAT) 20-100 MCG/ACT AERS respimat Inhale 1-2 puffs into the lungs every 6 (six) hours as needed for wheezing. 4 g 3  . LORazepam (ATIVAN) 1 MG tablet TAKE 1 TABLET BY MOUTH THREE TIMES DAILY AS NEEDED FOR ANXIETY 90 tablet 2  . losartan (COZAAR) 100 MG tablet Take 1 tablet (100 mg total) by mouth daily. 90 tablet 3  . meclizine (ANTIVERT) 25 MG tablet Take 1 tablet (25 mg total) by mouth every 6 (six) hours as needed for dizziness.    . predniSONE (DELTASONE) 20 MG tablet Take 1 tablet (20 mg total) by mouth 2 (two) times daily with a meal. 10 tablet 0  . prochlorperazine (COMPAZINE) 10 MG tablet Take 1 tablet (10 mg total) by mouth every 6 (six) hours as  needed for nausea or vomiting. 30 tablet 0  . sildenafil (VIAGRA) 100 MG tablet Take 0.5-1 tablets (50-100 mg total) by mouth daily as needed for erectile dysfunction. 10 tablet 11  . traZODone (DESYREL) 50 MG tablet TAKE 1 TO 2 TABLETS BY MOUTH AT BEDTIME AS NEEDED FOR SLEEP 45 tablet 5   No current facility-administered medications for this encounter.    Physical Findings: Vital signs stable.  The lungs are clear.  The heart has a regular rhythm and rate.  Lab Findings: Lab Results  Component Value Date   WBC 5.3 02/10/2020   HGB 13.7 02/10/2020   HCT 40.4 02/10/2020   MCV 88.6 02/10/2020   PLT 194 02/10/2020    Radiographic Findings: MR BRAIN W WO CONTRAST  Result Date: 01/25/2020 CLINICAL DATA:  59 year old male recently diagnosed with non-small cell lung cancer. Staging. EXAM: MRI HEAD WITHOUT AND WITH CONTRAST TECHNIQUE: Multiplanar, multiecho pulse sequences of the brain and surrounding structures were obtained without and with intravenous contrast. CONTRAST:  31mL GADAVIST GADOBUTROL 1 MMOL/ML IV SOLN COMPARISON:  PET-CT 01/16/2020.  Cervical spine MRI 01/04/2020. FINDINGS: Brain: No midline shift, mass effect, or evidence of intracranial mass lesion. No abnormal enhancement identified. No dural thickening identified. No restricted diffusion to suggest acute infarction.  No ventriculomegaly, extra-axial collection or acute intracranial hemorrhage. Cervicomedullary junction and pituitary are within normal limits. Pearline Cables and white matter signal is within normal limits for age throughout the brain; minimal nonspecific scattered white matter T2 and FLAIR hyperintensity. No cortical encephalomalacia or chronic cerebral blood products. Vascular: Major intracranial vascular flow voids are preserved, the left vertebral artery appears dominant as on the cervical spine study last month. The left transverse and sigmoid sinuses appear dominant. The left IJ bulb is patent. However, the right IJ bulb  appears occluded and the right sigmoid sinus appears atretic (series 16, image 33). The left IJ appear dominant on the cervical MRI last month. Skull and upper cervical spine: Visualized bone marrow signal is within normal limits. Negative visible cervical spine and spinal cord. Sinuses/Orbits: Negative orbits. Mucosal thickening and inspissated material throughout the right maxillary sinus. Small left maxillary sinus mucous retention cysts. Mild retained secretions in the nasal cavity. Mild ethmoid and frontal sinus mucosal thickening. Other: Mastoids are well pneumatized. Visible scalp and face appear negative. IMPRESSION: 1. No metastatic disease or acute intracranial abnormality identified. 2. 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 The left internal jugular vein appeared dominant on the cervical MRI last month. 3. Right maxillary and other lesser bilateral paranasal sinus disease. Electronically Signed   By: Genevie Ann M.D.   On: 01/25/2020 19:53   NM PET Image Initial (PI) Skull Base To Thigh  Result Date: 01/16/2020 CLINICAL DATA:  Initial treatment strategy for perihilar right lung non-small cell carcinoma. EXAM: NUCLEAR MEDICINE PET SKULL BASE TO THIGH TECHNIQUE: 9.8 mCi F-18 FDG was injected intravenously. Full-ring PET imaging was performed from the skull base to thigh after the radiotracer. CT data was obtained and used for attenuation correction and anatomic localization. Fasting blood glucose: 95 mg/dl COMPARISON:  12/20/2019 chest CT. FINDINGS: Mediastinal blood pool activity: SUV max 3.0 Liver activity: SUV max NA NECK: No hypermetabolic lymph nodes in the neck. Incidental CT findings: Near complete right maxillary sinus opacification. CHEST: Hypermetabolic spiculated thick-walled cavitary right upper perihilar 3.4 x 3.1 cm lung mass with max SUV 20.8 (series 8/image 26). Mildly hypermetabolic ground-glass 1.2 cm right middle lobe pulmonary  nodule with max SUV 3.1 (series 8/image 38). Low level FDG uptake associated with patchy indistinct ground-glass opacities throughout left upper lobe, for example measuring 1.4 cm with max SUV 2.1 in the left upper lobe (series 8/image 34). No enlarged or hypermetabolic axillary, mediastinal or left hilar lymph nodes. Incidental CT findings: Moderate centrilobular emphysema. Diffuse bronchial wall thickening. Two scattered small solid pulmonary nodules in the lungs bilaterally, largest 0.5 cm in the posterior left lower lobe pulmonary nodule, below PET resolution, demonstrating no significant uptake. ABDOMEN/PELVIS: No abnormal hypermetabolic activity within the liver, pancreas, adrenal glands, or spleen. No hypermetabolic lymph nodes in the abdomen or pelvis. Heterogeneous nonfocal prostate hypermetabolism with max SUV 6.2. Incidental CT findings: Atherosclerotic nonaneurysmal abdominal aorta. SKELETON: No focal hypermetabolic activity to suggest skeletal metastasis. Incidental CT findings: none IMPRESSION: 1. Intensely hypermetabolic (max SUV 93.8) spiculated thick-walled cavitary 3.4 cm perihilar right upper lung mass compatible with known primary bronchogenic carcinoma. 2. Low level hypermetabolism associated with 1.2 cm ground-glass right middle lobe pulmonary nodule and patchy indistinct left upper lobe ground-glass opacities, equivocal for inflammatory or metastatic disease. Two 0.5 cm solid pulmonary nodules, below PET resolution. Suggest attention on follow-up chest CT in 3 months. 3. No hypermetabolic thoracic adenopathy or extrathoracic or skeletal metastatic disease.  4. Nonspecific nonfocal heterogeneous prostate hypermetabolism. Suggest correlation with serum PSA. 5. Aortic Atherosclerosis (ICD10-I70.0) and Emphysema (ICD10-J43.9). Electronically Signed   By: Ilona Sorrel M.D.   On: 01/16/2020 11:07    Impression: Minimal hemoptysis as above.  Plan: Patient will start his radiation therapy  tomorrow.  ____________________________________ Gery Pray, MD

## 2020-02-12 NOTE — Progress Notes (Signed)
Patient here to see Dr. Sondra Come after his visit with CT Simulation. Patient mentioned to the RT staff he coughed up 4 dime sized thick clots of blood this morning at 615 am. He reports feeling fine and denies other sx. Reports the amount  was similar to what he coughed up after his bx. He reports he then coughed up some clear secretions with a thin line of blood in it.  T 98.1 P 93 R 18 BP 102/62 Sat 100%

## 2020-02-13 ENCOUNTER — Ambulatory Visit
Admission: RE | Admit: 2020-02-13 | Discharge: 2020-02-13 | Disposition: A | Discharge status: Home / Self Care | Payer: BC Managed Care – PPO | Source: Ambulatory Visit | Attending: Radiation Oncology | Admitting: Radiation Oncology

## 2020-02-13 ENCOUNTER — Other Ambulatory Visit: Payer: Self-pay

## 2020-02-13 DIAGNOSIS — C3491 Malignant neoplasm of unspecified part of right bronchus or lung: Secondary | ICD-10-CM

## 2020-02-13 DIAGNOSIS — Z510 Encounter for antineoplastic radiation therapy: Secondary | ICD-10-CM | POA: Diagnosis not present

## 2020-02-14 ENCOUNTER — Telehealth: Payer: Self-pay

## 2020-02-14 ENCOUNTER — Ambulatory Visit
Admission: RE | Admit: 2020-02-14 | Discharge: 2020-02-14 | Disposition: A | Discharge status: Home / Self Care | Payer: BC Managed Care – PPO | Source: Ambulatory Visit | Attending: Radiation Oncology | Admitting: Radiation Oncology

## 2020-02-14 DIAGNOSIS — Z510 Encounter for antineoplastic radiation therapy: Secondary | ICD-10-CM | POA: Diagnosis not present

## 2020-02-14 NOTE — Telephone Encounter (Signed)
Pt LM wanting to know the status of his disability paperwork.  Discussed with Roz, RN who advised the paperwork is in process and should be completed no later than mid-week of 02/17/20.  I have called the pt back and left a detailed message advising as indicated.

## 2020-02-16 NOTE — Progress Notes (Signed)
[name] OFFICE PROGRESS NOTE  Richard Nutting, DO Richard Li [place] [address]  DIAGNOSIS: Stage IIB (T3, N0, M0) non-small cell lung cancer, adenocarcinoma presented with large central perihilar mass with suspicious groundglass opacity in the right middle lobe and left upper lobe diagnosed in September 2021.  PRIOR THERAPY: None  CURRENT THERAPY: Concurrent chemoradiation with weekly carboplatin for AUC of 2 and paclitaxel 45 mg/M2.  First dose February 10, 2020. Status post 1 cycle.   INTERVAL HISTORY: Richard Li 59 y.o. male returns to the clinic for a follow up visit. The patient is feeling well today without any concerning complaints except for fatigue and coughing up mucus. The patient is status post his first cycle of treatment and he tolerated it well. His last day of radiation is scheduled for 03/18/20. Denies any fever, chills, night sweats, or weight loss. Denies any chest pain. He had a dime size of a clot of blood a few days ago and was told it may be secondary to radiation. He has not had anymore hemoptysis since last week. Reports mild dyspnea on exertion and cough for which he takes mucinex. Denies any nausea, vomiting, diarrhea, or constipation. Denies any headache or visual changes. The patient is here today for evaluation prior to starting cycle # 2   MEDICAL HISTORY: Past Medical History:  Diagnosis Date  . Allergic rhinitis, cause unspecified   . Anxiety state, unspecified   . Asthma    as a child  . Chronic airway obstruction, not elsewhere classified   . Esophageal reflux   . History of kidney stones   . Hypertension   . Lumbago   . Other and unspecified hyperlipidemia   . Other chest pain     ALLERGIES:  has no active allergies.  MEDICATIONS:  Current Outpatient Medications  Medication Sig Dispense Refill  . diclofenac Sodium (VOLTAREN) 1 % GEL Apply 1 application topically 3 (three) times daily as needed  (pain/muscle aches.).    [name] DUEXIS 800-26.6 MG TABS Take 1 tablet by mouth 3 (three) times daily as needed (pain.).     [name] fenofibrate 160 MG tablet Take 1 tablet (160 mg total) by mouth daily. 90 tablet 3  . guaiFENesin (MUCINEX) 600 MG 12 hr tablet Take 600 mg by mouth every morning.    . Ipratropium-Albuterol (COMBIVENT RESPIMAT) 20-100 MCG/ACT AERS respimat Inhale 1-2 puffs into the lungs every 6 (six) hours as needed for wheezing. 4 g 3  . LORazepam (ATIVAN) 1 MG tablet TAKE 1 TABLET BY MOUTH THREE TIMES DAILY AS NEEDED FOR ANXIETY 90 tablet 2  . losartan (COZAAR) 100 MG tablet Take 1 tablet (100 mg total) by mouth daily. 90 tablet 3  . meclizine (ANTIVERT) 25 MG tablet Take 1 tablet (25 mg total) by mouth every 6 (six) hours as needed for dizziness.    . prochlorperazine (COMPAZINE) 10 MG tablet Take 1 tablet (10 mg total) by mouth every 6 (six) hours as needed for nausea or vomiting. 30 tablet 0  . sildenafil (VIAGRA) 100 MG tablet Take 0.5-1 tablets (50-100 mg total) by mouth daily as needed for erectile dysfunction. 10 tablet 11  . traZODone (DESYREL) 50 MG tablet TAKE 1 TO 2 TABLETS BY MOUTH AT BEDTIME AS NEEDED FOR SLEEP 45 tablet 5  . predniSONE (DELTASONE) 20 MG tablet Take 1 tablet (20 mg total) by mouth 2 (two) times daily with a meal. 10 tablet 0   No current  facility-administered medications for this visit.    SURGICAL HISTORY:  Past Surgical History:  Procedure Laterality Date  . APPENDECTOMY    . BRONCHIAL BIOPSY  01/07/2020   Procedure: BRONCHIAL BIOPSIES;  Surgeon: Collene Gobble, MD;  Location: [facility] ENDOSCOPY;  Service: Pulmonary;;  . BRONCHIAL BRUSHINGS  01/07/2020   Procedure: BRONCHIAL BRUSHINGS;  Surgeon: Collene Gobble, MD;  Location: [facility] ENDOSCOPY;  Service: Pulmonary;;  . BRONCHIAL NEEDLE ASPIRATION BIOPSY  01/07/2020   Procedure: BRONCHIAL NEEDLE ASPIRATION BIOPSIES;  Surgeon: Collene Gobble, MD;  Location: [facility] ENDOSCOPY;  Service: Pulmonary;;  . BRONCHIAL WASHINGS   01/07/2020   Procedure: BRONCHIAL WASHINGS;  Surgeon: Collene Gobble, MD;  Location: [facility] ENDOSCOPY;  Service: Pulmonary;;  . COLONOSCOPY  15 years ago  . HEMOSTASIS CONTROL  01/07/2020   Procedure: HEMOSTASIS CONTROL;  Surgeon: Collene Gobble, MD;  Location: [facility] ENDOSCOPY;  Service: Pulmonary;;  cold saline  . NASAL TURBINATE REDUCTION  2002   Dr.Crossley  . VASECTOMY    . VIDEO BRONCHOSCOPY WITH ENDOBRONCHIAL NAVIGATION N/A 01/07/2020   Procedure: VIDEO BRONCHOSCOPY WITH ENDOBRONCHIAL NAVIGATION;  Surgeon: Collene Gobble, MD;  Location: [facility] ENDOSCOPY;  Service: Pulmonary;  Laterality: N/A;  . VIDEO BRONCHOSCOPY WITH ENDOBRONCHIAL ULTRASOUND N/A 01/07/2020   Procedure: VIDEO BRONCHOSCOPY WITH ENDOBRONCHIAL ULTRASOUND;  Surgeon: Collene Gobble, MD;  Location: [facility] ENDOSCOPY;  Service: Pulmonary;  Laterality: N/A;    REVIEW OF SYSTEMS:   Review of Systems  Constitutional: Positive for fatigue. Negative for appetite change, chills, fever and unexpected weight change.  HENT:   Negative for mouth sores, nosebleeds, sore throat and trouble swallowing.   Eyes: Negative for eye problems and icterus.  Respiratory: Positive for hemoptyosis (resolved) and mild dyspnea on exertion and cough. Negative for wheezing.   Cardiovascular: Negative for chest pain and leg swelling.  Gastrointestinal: Negative for abdominal pain, constipation, diarrhea, nausea and vomiting.  Genitourinary: Negative for bladder incontinence, difficulty urinating, dysuria, frequency and hematuria.   Musculoskeletal: Negative for back pain, gait problem, neck pain and neck stiffness.  Skin: Negative for itching and rash.  Neurological: Negative for dizziness, extremity weakness, gait problem, headaches, light-headedness and seizures.  Hematological: Negative for adenopathy. Does not bruise/bleed easily.  Psychiatric/Behavioral: Negative for confusion, depression and sleep disturbance. The patient is not nervous/anxious.     PHYSICAL  EXAMINATION:  Blood pressure 104/63, pulse 82, temperature 97.8 F (36.6 C), temperature source Tympanic, resp. rate 17, height 5' 10.5" (1.791 m), weight 201 lb 8 oz (91.4 kg), SpO2 100 %.  ECOG PERFORMANCE STATUS: 1 - Symptomatic but completely ambulatory  Physical Exam  Constitutional: Oriented to person, place, and time and well-developed, well-nourished, and in no distress.  HENT:  Head: Normocephalic and atraumatic.  Mouth/Throat: Oropharynx is clear and moist. No oropharyngeal exudate.  Eyes: Conjunctivae are normal. Right eye exhibits no discharge. Left eye exhibits no discharge. No scleral icterus.  Neck: Normal range of motion. Neck supple.  Cardiovascular: Normal rate, regular rhythm, normal heart sounds and intact distal pulses.   Pulmonary/Chest: Effort normal and breath sounds normal. No respiratory distress. No wheezes. No rales.  Abdominal: Soft. Bowel sounds are normal. Exhibits no distension and no mass. There is no tenderness.  Musculoskeletal: Normal range of motion. Exhibits no edema.  Lymphadenopathy:    No cervical adenopathy.  Neurological: Alert and oriented to person, place, and time. Exhibits normal muscle tone. Gait normal. Coordination normal.  Skin: Skin is warm and dry. No rash noted. Not diaphoretic. No erythema. No  pallor.  Psychiatric: Mood, memory and judgment normal.  Vitals reviewed.  LABORATORY DATA: Lab Results  Component Value Date   WBC 4.1 02/17/2020   HGB 12.9 (L) 02/17/2020   HCT 37.0 (L) 02/17/2020   MCV 87.7 02/17/2020   PLT 197 02/17/2020      Chemistry      Component Value Date/Time   NA 137 02/17/2020 0810   K 3.4 (L) 02/17/2020 0810   CL 103 02/17/2020 0810   CO2 26 02/17/2020 0810   BUN 12 02/17/2020 0810   CREATININE 0.87 02/17/2020 0810   CREATININE 1.26 11/06/2019 1132      Component Value Date/Time   CALCIUM 8.9 02/17/2020 0810   ALKPHOS 51 02/17/2020 0810   AST 15 02/17/2020 0810   ALT 11 02/17/2020 0810    BILITOT 0.6 02/17/2020 0810       RADIOGRAPHIC STUDIES:  MR BRAIN W WO CONTRAST  Result Date: 01/25/2020 CLINICAL DATA:  59 year old male recently diagnosed with non-small cell lung cancer. Staging. EXAM: MRI HEAD WITHOUT AND WITH CONTRAST TECHNIQUE: Multiplanar, multiecho pulse sequences of the brain and surrounding structures were obtained without and with intravenous contrast. CONTRAST:  22mL GADAVIST GADOBUTROL 1 MMOL/ML IV SOLN COMPARISON:  PET-CT 01/16/2020.  Cervical spine MRI 01/04/2020. FINDINGS: Brain: No midline shift, mass effect, or evidence of intracranial mass lesion. No abnormal enhancement identified. No dural thickening identified. No restricted diffusion to suggest acute infarction. No ventriculomegaly, extra-axial collection or acute intracranial hemorrhage. Cervicomedullary junction and pituitary are within normal limits. Pearline Cables and white matter signal is within normal limits for age throughout the brain; minimal nonspecific scattered white matter T2 and FLAIR hyperintensity. No cortical encephalomalacia or chronic cerebral blood products. Vascular: Major intracranial vascular flow voids are preserved, the left vertebral artery appears dominant as on the cervical spine study last month. The left transverse and sigmoid sinuses appear dominant. The left IJ bulb is patent. However, the right IJ bulb appears occluded and the right sigmoid sinus appears atretic (series 16, image 33). The left IJ appear dominant on the cervical MRI last month. Skull and upper cervical spine: Visualized bone marrow signal is within normal limits. Negative visible cervical spine and spinal cord. Sinuses/Orbits: Negative orbits. Mucosal thickening and inspissated material throughout the right maxillary sinus. Small left maxillary sinus mucous retention cysts. Mild retained secretions in the nasal cavity. Mild ethmoid and frontal sinus mucosal thickening. Other: Mastoids are well pneumatized. Visible scalp and face  appear negative. IMPRESSION: 1. No metastatic disease or acute intracranial abnormality identified. 2. 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 The left internal jugular vein appeared dominant on the cervical MRI last month. 3. Right maxillary and other lesser bilateral paranasal sinus disease. Electronically Signed   By: Genevie Ann M.D.   On: 01/25/2020 19:53     ASSESSMENT/PLAN:  This is a very pleasant 59 year old Caucasian male diagnosed with a stage IIb (T3, N0, M0) non-small cell lung cancer, adenocarcinoma presented with perihilar right upper lobe lung mass with suspicious groundglass opacity in the right middle lobe. He was diagnosed in September 2021.   The patient is currently undergoing weekly concurrent chemoradiation with carboplatin for an AUC of 2 and paclitaxel 45 mg/m2 weekly. He is status post 1 cycle and tolerated it  well    Labs were reviewed. Recommend that he proceed with cycle #2 today as scheduled.   We will see him back for a follow up visit in  2 weeks for evaluation before starting cycle #4.   The patient's hemoptysis has resolved. Cautioned the patient that if he develops significant hemoptysis to seek medical attention.   The patient was advised to call immediately if he has any concerning symptoms in the interval. The patient voices understanding of current disease status and treatment options and is in agreement with the current care plan. All questions were answered. The patient knows to call the clinic with any problems, questions or concerns. We can certainly see the patient much sooner if necessary       No orders of the defined types were placed in this encounter.    Evelyne Makepeace L Mance Vallejo, PA-C 02/17/20

## 2020-02-17 ENCOUNTER — Ambulatory Visit: Payer: BC Managed Care – PPO | Admitting: Radiation Oncology

## 2020-02-17 ENCOUNTER — Ambulatory Visit: Payer: BC Managed Care – PPO

## 2020-02-17 ENCOUNTER — Inpatient Hospital Stay: Payer: BC Managed Care – PPO

## 2020-02-17 ENCOUNTER — Other Ambulatory Visit: Payer: Self-pay

## 2020-02-17 ENCOUNTER — Encounter: Payer: Self-pay | Admitting: Physician Assistant

## 2020-02-17 ENCOUNTER — Inpatient Hospital Stay (HOSPITAL_BASED_OUTPATIENT_CLINIC_OR_DEPARTMENT_OTHER): Payer: BC Managed Care – PPO | Admitting: Physician Assistant

## 2020-02-17 ENCOUNTER — Ambulatory Visit
Admission: RE | Admit: 2020-02-17 | Discharge: 2020-02-17 | Disposition: A | Discharge status: Home / Self Care | Payer: BC Managed Care – PPO | Source: Ambulatory Visit | Attending: Radiation Oncology | Admitting: Radiation Oncology

## 2020-02-17 VITALS — BP 104/63 | HR 82 | Temp 97.8°F | Resp 17 | Ht 70.5 in | Wt 201.5 lb

## 2020-02-17 DIAGNOSIS — C3491 Malignant neoplasm of unspecified part of right bronchus or lung: Secondary | ICD-10-CM | POA: Diagnosis not present

## 2020-02-17 DIAGNOSIS — C3411 Malignant neoplasm of upper lobe, right bronchus or lung: Secondary | ICD-10-CM | POA: Diagnosis not present

## 2020-02-17 DIAGNOSIS — Z510 Encounter for antineoplastic radiation therapy: Secondary | ICD-10-CM | POA: Diagnosis not present

## 2020-02-17 DIAGNOSIS — Z5111 Encounter for antineoplastic chemotherapy: Secondary | ICD-10-CM | POA: Diagnosis not present

## 2020-02-17 LAB — CBC WITH DIFFERENTIAL (CANCER CENTER ONLY)
Abs Immature Granulocytes: 0.03 10*3/uL (ref 0.00–0.07)
Basophils Absolute: 0 10*3/uL (ref 0.0–0.1)
Basophils Relative: 1 %
Eosinophils Absolute: 0.2 10*3/uL (ref 0.0–0.5)
Eosinophils Relative: 4 %
HCT: 37 % — ABNORMAL LOW (ref 39.0–52.0)
Hemoglobin: 12.9 g/dL — ABNORMAL LOW (ref 13.0–17.0)
Immature Granulocytes: 1 %
Lymphocytes Relative: 28 %
Lymphs Abs: 1.2 10*3/uL (ref 0.7–4.0)
MCH: 30.6 pg (ref 26.0–34.0)
MCHC: 34.9 g/dL (ref 30.0–36.0)
MCV: 87.7 fL (ref 80.0–100.0)
Monocytes Absolute: 0.3 10*3/uL (ref 0.1–1.0)
Monocytes Relative: 7 %
Neutro Abs: 2.5 10*3/uL (ref 1.7–7.7)
Neutrophils Relative %: 59 %
Platelet Count: 197 10*3/uL (ref 150–400)
RBC: 4.22 MIL/uL (ref 4.22–5.81)
RDW: 13.4 % (ref 11.5–15.5)
WBC Count: 4.1 10*3/uL (ref 4.0–10.5)
nRBC: 0 % (ref 0.0–0.2)

## 2020-02-17 LAB — CMP (CANCER CENTER ONLY)
ALT: 11 U/L (ref 0–44)
AST: 15 U/L (ref 15–41)
Albumin: 3.5 g/dL (ref 3.5–5.0)
Alkaline Phosphatase: 51 U/L (ref 38–126)
Anion gap: 8 (ref 5–15)
BUN: 12 mg/dL (ref 6–20)
CO2: 26 mmol/L (ref 22–32)
Calcium: 8.9 mg/dL (ref 8.9–10.3)
Chloride: 103 mmol/L (ref 98–111)
Creatinine: 0.87 mg/dL (ref 0.61–1.24)
GFR, Estimated: >60 mL/min (ref >60)
Glucose, Bld: 80 mg/dL (ref 70–99)
Potassium: 3.4 mmol/L — ABNORMAL LOW (ref 3.5–5.1)
Sodium: 137 mmol/L (ref 135–145)
Total Bilirubin: 0.6 mg/dL (ref 0.3–1.2)
Total Protein: 6.2 g/dL — ABNORMAL LOW (ref 6.5–8.1)

## 2020-02-17 MED ORDER — SODIUM CHLORIDE 0.9% FLUSH
10.0000 mL | INTRAVENOUS | Status: DC | PRN
  Filled 2020-02-17: qty 10

## 2020-02-17 MED ORDER — PALONOSETRON HCL INJECTION 0.25 MG/5ML
INTRAVENOUS | Status: AC
  Filled 2020-02-17: qty 5

## 2020-02-17 MED ORDER — SODIUM CHLORIDE 0.9 % IV SOLN
263.6000 mg | Freq: Once | INTRAVENOUS | Status: AC
  Administered 2020-02-17: 260 mg via INTRAVENOUS
  Filled 2020-02-17: qty 26

## 2020-02-17 MED ORDER — PALONOSETRON HCL INJECTION 0.25 MG/5ML
0.2500 mg | Freq: Once | INTRAVENOUS | Status: AC
  Administered 2020-02-17: 0.25 mg via INTRAVENOUS

## 2020-02-17 MED ORDER — SODIUM CHLORIDE 0.9 % IV SOLN
45.0000 mg/m2 | Freq: Once | INTRAVENOUS | Status: AC
  Administered 2020-02-17: 96 mg via INTRAVENOUS
  Filled 2020-02-17: qty 16

## 2020-02-17 MED ORDER — FAMOTIDINE IN NACL 20-0.9 MG/50ML-% IV SOLN
INTRAVENOUS | Status: AC
  Filled 2020-02-17: qty 50

## 2020-02-17 MED ORDER — SODIUM CHLORIDE 0.9 % IV SOLN
Freq: Once | INTRAVENOUS | Status: AC
  Filled 2020-02-17: qty 250

## 2020-02-17 MED ORDER — SODIUM CHLORIDE 0.9 % IV SOLN
20.0000 mg | Freq: Once | INTRAVENOUS | Status: AC
  Administered 2020-02-17: 20 mg via INTRAVENOUS
  Filled 2020-02-17: qty 20

## 2020-02-17 MED ORDER — DIPHENHYDRAMINE HCL 50 MG/ML IJ SOLN
INTRAMUSCULAR | Status: AC
  Filled 2020-02-17: qty 1

## 2020-02-17 MED ORDER — HEPARIN SOD (PORK) LOCK FLUSH 100 UNIT/ML IV SOLN
500.0000 [IU] | Freq: Once | INTRAVENOUS | Status: DC | PRN
  Filled 2020-02-17: qty 5

## 2020-02-17 MED ORDER — DIPHENHYDRAMINE HCL 50 MG/ML IJ SOLN
50.0000 mg | Freq: Once | INTRAMUSCULAR | Status: AC
  Administered 2020-02-17: 50 mg via INTRAVENOUS

## 2020-02-17 MED ORDER — FAMOTIDINE IN NACL 20-0.9 MG/50ML-% IV SOLN
20.0000 mg | Freq: Once | INTRAVENOUS | Status: AC
  Administered 2020-02-17: 20 mg via INTRAVENOUS

## 2020-02-17 NOTE — Patient Instructions (Signed)
[facility] Discharge Instructions for Patients Receiving Chemotherapy  Today you received the following chemotherapy agents paclitaxel, carboplatin   To help prevent nausea and vomiting after your treatment, we encourage you to take your nausea medication as directed.    If you develop nausea and vomiting that is not controlled by your nausea medication, call the clinic.   BELOW ARE SYMPTOMS THAT SHOULD BE REPORTED IMMEDIATELY:  *FEVER GREATER THAN 100.5 F  *CHILLS WITH OR WITHOUT FEVER  NAUSEA AND VOMITING THAT IS NOT CONTROLLED WITH YOUR NAUSEA MEDICATION  *UNUSUAL SHORTNESS OF BREATH  *UNUSUAL BRUISING OR BLEEDING  TENDERNESS IN MOUTH AND THROAT WITH OR WITHOUT PRESENCE OF ULCERS  *URINARY PROBLEMS  *BOWEL PROBLEMS  UNUSUAL RASH Items with * indicate a potential emergency and should be followed up as soon as possible.  Feel free to call the clinic should you have any questions or concerns. The clinic phone number is [phone].  Please show the [facility] at check-in to the Emergency Department and triage nurse.

## 2020-02-17 NOTE — Progress Notes (Signed)
FMLA for [employer] and disability for [employer] form ( same form) sent to [employer] .

## 2020-02-18 ENCOUNTER — Other Ambulatory Visit: Payer: Self-pay

## 2020-02-18 ENCOUNTER — Ambulatory Visit
Admission: RE | Admit: 2020-02-18 | Discharge: 2020-02-18 | Disposition: A | Discharge status: Home / Self Care | Payer: BC Managed Care – PPO | Source: Ambulatory Visit | Attending: Radiation Oncology | Admitting: Radiation Oncology

## 2020-02-18 DIAGNOSIS — Z510 Encounter for antineoplastic radiation therapy: Secondary | ICD-10-CM | POA: Diagnosis not present

## 2020-02-19 ENCOUNTER — Ambulatory Visit: Payer: BC Managed Care – PPO

## 2020-02-19 ENCOUNTER — Ambulatory Visit: Payer: BC Managed Care – PPO | Admitting: Radiation Oncology

## 2020-02-19 ENCOUNTER — Ambulatory Visit
Admission: RE | Admit: 2020-02-19 | Discharge: 2020-02-19 | Disposition: A | Discharge status: Home / Self Care | Payer: BC Managed Care – PPO | Source: Ambulatory Visit | Attending: Radiation Oncology | Admitting: Radiation Oncology

## 2020-02-19 DIAGNOSIS — Z510 Encounter for antineoplastic radiation therapy: Secondary | ICD-10-CM | POA: Diagnosis not present

## 2020-02-20 ENCOUNTER — Ambulatory Visit
Admission: RE | Admit: 2020-02-20 | Discharge: 2020-02-20 | Disposition: A | Discharge status: Home / Self Care | Payer: BC Managed Care – PPO | Source: Ambulatory Visit | Attending: Radiation Oncology | Admitting: Radiation Oncology

## 2020-02-20 DIAGNOSIS — Z510 Encounter for antineoplastic radiation therapy: Secondary | ICD-10-CM | POA: Diagnosis not present

## 2020-02-20 LAB — ACID FAST CULTURE WITH REFLEXED SENSITIVITIES (MYCOBACTERIA): Acid Fast Culture: NEGATIVE

## 2020-02-21 ENCOUNTER — Other Ambulatory Visit: Payer: Self-pay

## 2020-02-21 ENCOUNTER — Ambulatory Visit
Admission: RE | Admit: 2020-02-21 | Discharge: 2020-02-21 | Disposition: A | Discharge status: Home / Self Care | Payer: BC Managed Care – PPO | Source: Ambulatory Visit | Attending: Radiation Oncology | Admitting: Radiation Oncology

## 2020-02-21 DIAGNOSIS — Z510 Encounter for antineoplastic radiation therapy: Secondary | ICD-10-CM | POA: Diagnosis not present

## 2020-02-24 ENCOUNTER — Other Ambulatory Visit: Payer: Self-pay

## 2020-02-24 ENCOUNTER — Ambulatory Visit
Admission: RE | Admit: 2020-02-24 | Discharge: 2020-02-24 | Disposition: A | Discharge status: Home / Self Care | Payer: BC Managed Care – PPO | Source: Ambulatory Visit | Attending: Radiation Oncology | Admitting: Radiation Oncology

## 2020-02-24 ENCOUNTER — Inpatient Hospital Stay: Payer: BC Managed Care – PPO

## 2020-02-24 ENCOUNTER — Inpatient Hospital Stay: Payer: BC Managed Care – PPO | Attending: Internal Medicine

## 2020-02-24 VITALS — BP 90/51 | HR 88 | Temp 98.0°F | Resp 20

## 2020-02-24 DIAGNOSIS — Z79899 Other long term (current) drug therapy: Secondary | ICD-10-CM | POA: Insufficient documentation

## 2020-02-24 DIAGNOSIS — Z510 Encounter for antineoplastic radiation therapy: Secondary | ICD-10-CM | POA: Insufficient documentation

## 2020-02-24 DIAGNOSIS — C3411 Malignant neoplasm of upper lobe, right bronchus or lung: Secondary | ICD-10-CM | POA: Insufficient documentation

## 2020-02-24 DIAGNOSIS — C3491 Malignant neoplasm of unspecified part of right bronchus or lung: Secondary | ICD-10-CM

## 2020-02-24 DIAGNOSIS — E860 Dehydration: Secondary | ICD-10-CM | POA: Insufficient documentation

## 2020-02-24 DIAGNOSIS — J189 Pneumonia, unspecified organism: Secondary | ICD-10-CM | POA: Insufficient documentation

## 2020-02-24 DIAGNOSIS — F419 Anxiety disorder, unspecified: Secondary | ICD-10-CM | POA: Diagnosis not present

## 2020-02-24 DIAGNOSIS — I10 Essential (primary) hypertension: Secondary | ICD-10-CM | POA: Diagnosis not present

## 2020-02-24 DIAGNOSIS — Z5111 Encounter for antineoplastic chemotherapy: Secondary | ICD-10-CM | POA: Diagnosis present

## 2020-02-24 DIAGNOSIS — Z923 Personal history of irradiation: Secondary | ICD-10-CM | POA: Diagnosis not present

## 2020-02-24 DIAGNOSIS — R1310 Dysphagia, unspecified: Secondary | ICD-10-CM | POA: Insufficient documentation

## 2020-02-24 DIAGNOSIS — J45909 Unspecified asthma, uncomplicated: Secondary | ICD-10-CM | POA: Diagnosis not present

## 2020-02-24 LAB — CMP (CANCER CENTER ONLY)
ALT: 11 U/L (ref 0–44)
AST: 16 U/L (ref 15–41)
Albumin: 3.3 g/dL — ABNORMAL LOW (ref 3.5–5.0)
Alkaline Phosphatase: 53 U/L (ref 38–126)
Anion gap: 9 (ref 5–15)
BUN: 12 mg/dL (ref 6–20)
CO2: 26 mmol/L (ref 22–32)
Calcium: 9.2 mg/dL (ref 8.9–10.3)
Chloride: 102 mmol/L (ref 98–111)
Creatinine: 0.94 mg/dL (ref 0.61–1.24)
GFR, Estimated: >60 mL/min (ref >60)
Glucose, Bld: 86 mg/dL (ref 70–99)
Potassium: 3 mmol/L — ABNORMAL LOW (ref 3.5–5.1)
Sodium: 137 mmol/L (ref 135–145)
Total Bilirubin: 0.6 mg/dL (ref 0.3–1.2)
Total Protein: 6.5 g/dL (ref 6.5–8.1)

## 2020-02-24 LAB — CBC WITH DIFFERENTIAL (CANCER CENTER ONLY)
Abs Immature Granulocytes: 0.03 10*3/uL (ref 0.00–0.07)
Basophils Absolute: 0 10*3/uL (ref 0.0–0.1)
Basophils Relative: 1 %
Eosinophils Absolute: 0.1 10*3/uL (ref 0.0–0.5)
Eosinophils Relative: 2 %
HCT: 33.9 % — ABNORMAL LOW (ref 39.0–52.0)
Hemoglobin: 11.8 g/dL — ABNORMAL LOW (ref 13.0–17.0)
Immature Granulocytes: 1 %
Lymphocytes Relative: 24 %
Lymphs Abs: 1 10*3/uL (ref 0.7–4.0)
MCH: 30.6 pg (ref 26.0–34.0)
MCHC: 34.8 g/dL (ref 30.0–36.0)
MCV: 87.8 fL (ref 80.0–100.0)
Monocytes Absolute: 0.6 10*3/uL (ref 0.1–1.0)
Monocytes Relative: 15 %
Neutro Abs: 2.3 10*3/uL (ref 1.7–7.7)
Neutrophils Relative %: 57 %
Platelet Count: 267 10*3/uL (ref 150–400)
RBC: 3.86 MIL/uL — ABNORMAL LOW (ref 4.22–5.81)
RDW: 13.1 % (ref 11.5–15.5)
WBC Count: 4 10*3/uL (ref 4.0–10.5)
nRBC: 0 % (ref 0.0–0.2)

## 2020-02-24 MED ORDER — SODIUM CHLORIDE 0.9 % IV SOLN
Freq: Once | INTRAVENOUS | Status: AC
  Filled 2020-02-24: qty 250

## 2020-02-24 MED ORDER — PALONOSETRON HCL INJECTION 0.25 MG/5ML
0.2500 mg | Freq: Once | INTRAVENOUS | Status: AC
  Administered 2020-02-24: 0.25 mg via INTRAVENOUS

## 2020-02-24 MED ORDER — DIPHENHYDRAMINE HCL 50 MG/ML IJ SOLN
50.0000 mg | Freq: Once | INTRAMUSCULAR | Status: AC
  Administered 2020-02-24: 50 mg via INTRAVENOUS

## 2020-02-24 MED ORDER — PALONOSETRON HCL INJECTION 0.25 MG/5ML
INTRAVENOUS | Status: AC
  Filled 2020-02-24: qty 5

## 2020-02-24 MED ORDER — FAMOTIDINE IN NACL 20-0.9 MG/50ML-% IV SOLN
INTRAVENOUS | Status: AC
  Filled 2020-02-24: qty 50

## 2020-02-24 MED ORDER — SODIUM CHLORIDE 0.9 % IV SOLN
263.6000 mg | Freq: Once | INTRAVENOUS | Status: AC
  Administered 2020-02-24: 260 mg via INTRAVENOUS
  Filled 2020-02-24: qty 26

## 2020-02-24 MED ORDER — DIPHENHYDRAMINE HCL 50 MG/ML IJ SOLN
INTRAMUSCULAR | Status: AC
  Filled 2020-02-24: qty 1

## 2020-02-24 MED ORDER — SODIUM CHLORIDE 0.9 % IV SOLN
20.0000 mg | Freq: Once | INTRAVENOUS | Status: AC
  Administered 2020-02-24: 20 mg via INTRAVENOUS
  Filled 2020-02-24: qty 20

## 2020-02-24 MED ORDER — FAMOTIDINE IN NACL 20-0.9 MG/50ML-% IV SOLN
20.0000 mg | Freq: Once | INTRAVENOUS | Status: AC
  Administered 2020-02-24: 20 mg via INTRAVENOUS

## 2020-02-24 MED ORDER — SODIUM CHLORIDE 0.9 % IV SOLN
45.0000 mg/m2 | Freq: Once | INTRAVENOUS | Status: AC
  Administered 2020-02-24: 96 mg via INTRAVENOUS
  Filled 2020-02-24: qty 16

## 2020-02-24 NOTE — Patient Instructions (Signed)
[facility] Discharge Instructions for Patients Receiving Chemotherapy  Today you received the following chemotherapy agents Paclitaxel (TAXOL) & Carboplatin (PARAPLATIN).  To help prevent nausea and vomiting after your treatment, we encourage you to take your nausea medication as prescribed.  If you develop nausea and vomiting that is not controlled by your nausea medication, call the clinic.   BELOW ARE SYMPTOMS THAT SHOULD BE REPORTED IMMEDIATELY:  *FEVER GREATER THAN 100.5 F  *CHILLS WITH OR WITHOUT FEVER  NAUSEA AND VOMITING THAT IS NOT CONTROLLED WITH YOUR NAUSEA MEDICATION  *UNUSUAL SHORTNESS OF BREATH  *UNUSUAL BRUISING OR BLEEDING  TENDERNESS IN MOUTH AND THROAT WITH OR WITHOUT PRESENCE OF ULCERS  *URINARY PROBLEMS  *BOWEL PROBLEMS  UNUSUAL RASH Items with * indicate a potential emergency and should be followed up as soon as possible.  Feel free to call the clinic should you have any questions or concerns. The clinic phone number is [phone].  Please show the CHEMO ALERT CARD at check-in to the Emergency Department and triage nurse.   

## 2020-02-25 ENCOUNTER — Other Ambulatory Visit: Payer: Self-pay

## 2020-02-25 ENCOUNTER — Other Ambulatory Visit: Payer: Self-pay | Admitting: Radiation Oncology

## 2020-02-25 ENCOUNTER — Ambulatory Visit
Admission: RE | Admit: 2020-02-25 | Discharge: 2020-02-25 | Disposition: A | Discharge status: Home / Self Care | Payer: BC Managed Care – PPO | Source: Ambulatory Visit | Attending: Radiation Oncology | Admitting: Radiation Oncology

## 2020-02-25 DIAGNOSIS — Z510 Encounter for antineoplastic radiation therapy: Secondary | ICD-10-CM | POA: Diagnosis not present

## 2020-02-25 MED ORDER — LEVOFLOXACIN 500 MG PO TABS: 500.0000 mg | ORAL_TABLET | Freq: Every day | ORAL | 0 refills | Status: DC

## 2020-02-25 MED FILL — levoFLOXacin 500 MG TABS: 500 | 10 days supply | Qty: 10 | Fill #0

## 2020-02-26 ENCOUNTER — Ambulatory Visit
Admission: RE | Admit: 2020-02-26 | Discharge: 2020-02-26 | Disposition: A | Discharge status: Home / Self Care | Payer: BC Managed Care – PPO | Source: Ambulatory Visit | Attending: Radiation Oncology | Admitting: Radiation Oncology

## 2020-02-26 DIAGNOSIS — Z510 Encounter for antineoplastic radiation therapy: Secondary | ICD-10-CM | POA: Diagnosis not present

## 2020-02-27 ENCOUNTER — Ambulatory Visit
Admission: RE | Admit: 2020-02-27 | Discharge: 2020-02-27 | Disposition: A | Discharge status: Home / Self Care | Payer: BC Managed Care – PPO | Source: Ambulatory Visit | Attending: Radiation Oncology | Admitting: Radiation Oncology

## 2020-02-27 DIAGNOSIS — Z510 Encounter for antineoplastic radiation therapy: Secondary | ICD-10-CM | POA: Diagnosis not present

## 2020-02-28 ENCOUNTER — Ambulatory Visit
Admission: RE | Admit: 2020-02-28 | Discharge: 2020-02-28 | Disposition: A | Discharge status: Home / Self Care | Payer: BC Managed Care – PPO | Source: Ambulatory Visit | Attending: Radiation Oncology | Admitting: Radiation Oncology

## 2020-02-28 DIAGNOSIS — Z510 Encounter for antineoplastic radiation therapy: Secondary | ICD-10-CM | POA: Diagnosis not present

## 2020-03-02 ENCOUNTER — Ambulatory Visit
Admission: RE | Admit: 2020-03-02 | Discharge: 2020-03-02 | Disposition: A | Discharge status: Home / Self Care | Payer: BC Managed Care – PPO | Source: Ambulatory Visit | Attending: Radiation Oncology | Admitting: Radiation Oncology

## 2020-03-02 ENCOUNTER — Other Ambulatory Visit: Payer: Self-pay | Admitting: Internal Medicine

## 2020-03-02 ENCOUNTER — Inpatient Hospital Stay (HOSPITAL_BASED_OUTPATIENT_CLINIC_OR_DEPARTMENT_OTHER): Payer: BC Managed Care – PPO | Admitting: Internal Medicine

## 2020-03-02 ENCOUNTER — Other Ambulatory Visit: Payer: Self-pay

## 2020-03-02 ENCOUNTER — Encounter: Payer: Self-pay | Admitting: Internal Medicine

## 2020-03-02 ENCOUNTER — Inpatient Hospital Stay: Payer: BC Managed Care – PPO

## 2020-03-02 VITALS — HR 103

## 2020-03-02 VITALS — BP 91/57 | HR 107 | Temp 99.6°F | Resp 20 | Ht 70.0 in | Wt 198.8 lb

## 2020-03-02 DIAGNOSIS — Z79899 Other long term (current) drug therapy: Secondary | ICD-10-CM | POA: Diagnosis not present

## 2020-03-02 DIAGNOSIS — F419 Anxiety disorder, unspecified: Secondary | ICD-10-CM | POA: Diagnosis not present

## 2020-03-02 DIAGNOSIS — E860 Dehydration: Secondary | ICD-10-CM | POA: Diagnosis not present

## 2020-03-02 DIAGNOSIS — C3491 Malignant neoplasm of unspecified part of right bronchus or lung: Secondary | ICD-10-CM

## 2020-03-02 DIAGNOSIS — Z5111 Encounter for antineoplastic chemotherapy: Secondary | ICD-10-CM | POA: Diagnosis present

## 2020-03-02 DIAGNOSIS — J441 Chronic obstructive pulmonary disease with (acute) exacerbation: Secondary | ICD-10-CM | POA: Diagnosis not present

## 2020-03-02 DIAGNOSIS — I10 Essential (primary) hypertension: Secondary | ICD-10-CM

## 2020-03-02 DIAGNOSIS — R1310 Dysphagia, unspecified: Secondary | ICD-10-CM | POA: Diagnosis not present

## 2020-03-02 DIAGNOSIS — C3411 Malignant neoplasm of upper lobe, right bronchus or lung: Secondary | ICD-10-CM | POA: Diagnosis not present

## 2020-03-02 DIAGNOSIS — J45909 Unspecified asthma, uncomplicated: Secondary | ICD-10-CM | POA: Diagnosis not present

## 2020-03-02 DIAGNOSIS — Z923 Personal history of irradiation: Secondary | ICD-10-CM | POA: Diagnosis not present

## 2020-03-02 DIAGNOSIS — J189 Pneumonia, unspecified organism: Secondary | ICD-10-CM | POA: Diagnosis not present

## 2020-03-02 LAB — CBC WITH DIFFERENTIAL (CANCER CENTER ONLY)
Abs Immature Granulocytes: 0.08 10*3/uL — ABNORMAL HIGH (ref 0.00–0.07)
Basophils Absolute: 0 10*3/uL (ref 0.0–0.1)
Basophils Relative: 1 %
Eosinophils Absolute: 0.1 10*3/uL (ref 0.0–0.5)
Eosinophils Relative: 1 %
HCT: 30.1 % — ABNORMAL LOW (ref 39.0–52.0)
Hemoglobin: 10.9 g/dL — ABNORMAL LOW (ref 13.0–17.0)
Immature Granulocytes: 1 %
Lymphocytes Relative: 5 %
Lymphs Abs: 0.3 10*3/uL — ABNORMAL LOW (ref 0.7–4.0)
MCH: 30.5 pg (ref 26.0–34.0)
MCHC: 36.2 g/dL — ABNORMAL HIGH (ref 30.0–36.0)
MCV: 84.3 fL (ref 80.0–100.0)
Monocytes Absolute: 1.1 10*3/uL — ABNORMAL HIGH (ref 0.1–1.0)
Monocytes Relative: 18 %
Neutro Abs: 4.8 10*3/uL (ref 1.7–7.7)
Neutrophils Relative %: 74 %
Platelet Count: 216 10*3/uL (ref 150–400)
RBC: 3.57 MIL/uL — ABNORMAL LOW (ref 4.22–5.81)
RDW: 12.7 % (ref 11.5–15.5)
WBC Count: 6.5 10*3/uL (ref 4.0–10.5)
nRBC: 0 % (ref 0.0–0.2)

## 2020-03-02 LAB — CMP (CANCER CENTER ONLY)
ALT: 13 U/L (ref 0–44)
AST: 20 U/L (ref 15–41)
Albumin: 2.8 g/dL — ABNORMAL LOW (ref 3.5–5.0)
Alkaline Phosphatase: 67 U/L (ref 38–126)
Anion gap: 9 (ref 5–15)
BUN: 20 mg/dL (ref 6–20)
CO2: 23 mmol/L (ref 22–32)
Calcium: 8.7 mg/dL — ABNORMAL LOW (ref 8.9–10.3)
Chloride: 101 mmol/L (ref 98–111)
Creatinine: 1.84 mg/dL — ABNORMAL HIGH (ref 0.61–1.24)
GFR, Estimated: 42 mL/min — ABNORMAL LOW (ref >60)
Glucose, Bld: 112 mg/dL — ABNORMAL HIGH (ref 70–99)
Potassium: 3.3 mmol/L — ABNORMAL LOW (ref 3.5–5.1)
Sodium: 133 mmol/L — ABNORMAL LOW (ref 135–145)
Total Bilirubin: 0.8 mg/dL (ref 0.3–1.2)
Total Protein: 6.4 g/dL — ABNORMAL LOW (ref 6.5–8.1)

## 2020-03-02 MED ORDER — SUCRALFATE 1 G PO TABS: 1.0000 g | ORAL_TABLET | Freq: Three times a day (TID) | ORAL | 1 refills | Status: DC

## 2020-03-02 MED ORDER — HYDROCODONE-HOMATROPINE 5-1.5 MG/5ML PO SYRP
5.0000 mL | ORAL_SOLUTION | Freq: Four times a day (QID) | ORAL | 0 refills | Status: DC | PRN
Start: 2020-03-02 — End: 2020-03-05

## 2020-03-02 MED ORDER — SODIUM CHLORIDE 0.9 % IV SOLN
161.4000 mg | Freq: Once | INTRAVENOUS | Status: AC
  Administered 2020-03-02: 160 mg via INTRAVENOUS
  Filled 2020-03-02: qty 16

## 2020-03-02 MED ORDER — SODIUM CHLORIDE 0.9 % IV SOLN
20.0000 mg | Freq: Once | INTRAVENOUS | Status: AC
  Administered 2020-03-02: 20 mg via INTRAVENOUS
  Filled 2020-03-02: qty 20

## 2020-03-02 MED ORDER — DIPHENHYDRAMINE HCL 50 MG/ML IJ SOLN
50.0000 mg | Freq: Once | INTRAMUSCULAR | Status: AC
  Administered 2020-03-02: 50 mg via INTRAVENOUS

## 2020-03-02 MED ORDER — SODIUM CHLORIDE 0.9 % IV SOLN
Freq: Once | INTRAVENOUS | Status: DC
  Filled 2020-03-02: qty 250

## 2020-03-02 MED ORDER — SODIUM CHLORIDE 0.9 % IV SOLN
45.0000 mg/m2 | Freq: Once | INTRAVENOUS | Status: AC
  Administered 2020-03-02: 96 mg via INTRAVENOUS
  Filled 2020-03-02: qty 16

## 2020-03-02 MED ORDER — PALONOSETRON HCL INJECTION 0.25 MG/5ML
INTRAVENOUS | Status: AC
  Filled 2020-03-02: qty 5

## 2020-03-02 MED ORDER — DIPHENHYDRAMINE HCL 50 MG/ML IJ SOLN
INTRAMUSCULAR | Status: AC
  Filled 2020-03-02: qty 1

## 2020-03-02 MED ORDER — FAMOTIDINE IN NACL 20-0.9 MG/50ML-% IV SOLN
INTRAVENOUS | Status: AC
  Filled 2020-03-02: qty 50

## 2020-03-02 MED ORDER — FAMOTIDINE IN NACL 20-0.9 MG/50ML-% IV SOLN
20.0000 mg | Freq: Once | INTRAVENOUS | Status: AC
  Administered 2020-03-02: 20 mg via INTRAVENOUS

## 2020-03-02 MED ORDER — SODIUM CHLORIDE 0.9 % IV SOLN
INTRAVENOUS | Status: AC
  Filled 2020-03-02: qty 250

## 2020-03-02 MED ORDER — PALONOSETRON HCL INJECTION 0.25 MG/5ML
0.2500 mg | Freq: Once | INTRAVENOUS | Status: AC
  Administered 2020-03-02: 0.25 mg via INTRAVENOUS

## 2020-03-02 MED FILL — HYDROCODONE-HOMATROPINE SYR: 5-1.5 | 6 days supply | Qty: 120 | Fill #0

## 2020-03-02 MED FILL — SUCRALFATE 1 GM TAB: 1 | 30 days supply | Qty: 120 | Fill #0

## 2020-03-02 NOTE — Progress Notes (Signed)
Per Dr. Julien Nordmann, pt okay to tx with creatinine = 1.84.

## 2020-03-02 NOTE — Progress Notes (Signed)
[place] [phone]   [phone]  OFFICE PROGRESS NOTE  [name], DO [place] [address]  DIAGNOSIS: Stage IIB (T3, N0, M0) non-small cell lung cancer, adenocarcinoma presented with large central perihilar mass with suspicious groundglass opacity in the right middle lobe and left upper lobe diagnosed in September 2021.  PRIOR THERAPY:None.   CURRENT THERAPY: Concurrent chemoradiation with weekly carboplatin for AUC of 2 and paclitaxel 45 mg/M2.  First dose February 10, 2020.  Status post 3 cycles.  INTERVAL HISTORY: Richard Li 59 y.o. male returns to the clinic today for follow-up visit.  The patient continues to complain of worsening cough and he using Robitussin that is not effective.  He also has shortness of breath with exertion and chest pain from the coughing.  He was recently diagnosed with suspicious pneumonia and started on treatment with Levaquin.  He denied having any other significant complaints except for fatigue and generalized weakness.  He has some mild odynophagia.  He denied having any chest pain or hemoptysis.  He denied having any nausea, vomiting, diarrhea or constipation.  He has no headache or visual changes.  He is here today for evaluation before starting cycle #4 of his treatment.  MEDICAL HISTORY: Past Medical History:  Diagnosis Date  . Allergic rhinitis, cause unspecified   . Anxiety state, unspecified   . Asthma    as a child  . Chronic airway obstruction, not elsewhere classified   . Esophageal reflux   . History of kidney stones   . Hypertension   . Lumbago   . Other and unspecified hyperlipidemia   . Other chest pain     ALLERGIES:  has no active allergies.  MEDICATIONS:  Current Outpatient Medications  Medication Sig Dispense Refill  . diclofenac Sodium (VOLTAREN) 1 % GEL Apply 1 application topically 3 (three) times daily as needed (pain/muscle  aches.).    [name] DUEXIS 800-26.6 MG TABS Take 1 tablet by mouth 3 (three) times daily as needed (pain.).     [name] fenofibrate 160 MG tablet Take 1 tablet (160 mg total) by mouth daily. 90 tablet 3  . guaiFENesin (MUCINEX) 600 MG 12 hr tablet Take 600 mg by mouth every morning.    . Ipratropium-Albuterol (COMBIVENT RESPIMAT) 20-100 MCG/ACT AERS respimat Inhale 1-2 puffs into the lungs every 6 (six) hours as needed for wheezing. 4 g 3  . levofloxacin (LEVAQUIN) 500 MG tablet Take 1 tablet (500 mg total) by mouth daily. 10 tablet 0  . LORazepam (ATIVAN) 1 MG tablet TAKE 1 TABLET BY MOUTH THREE TIMES DAILY AS NEEDED FOR ANXIETY 90 tablet 2  . losartan (COZAAR) 100 MG tablet Take 1 tablet (100 mg total) by mouth daily. 90 tablet 3  . meclizine (ANTIVERT) 25 MG tablet Take 1 tablet (25 mg total) by mouth every 6 (six) hours as needed for dizziness.    . prochlorperazine (COMPAZINE) 10 MG tablet Take 1 tablet (10 mg total) by mouth every 6 (six) hours as needed for nausea or vomiting. 30 tablet 0  . sildenafil (VIAGRA) 100 MG tablet Take 0.5-1 tablets (50-100 mg total) by mouth daily as needed for erectile dysfunction. 10 tablet 11  . traZODone (DESYREL) 50 MG tablet TAKE 1 TO 2 TABLETS BY MOUTH AT BEDTIME AS NEEDED FOR SLEEP 45 tablet 5   No current facility-administered medications for this visit.    SURGICAL HISTORY:  Past Surgical History:  Procedure Laterality Date  . APPENDECTOMY    . BRONCHIAL BIOPSY  01/07/2020   Procedure: BRONCHIAL BIOPSIES;  Surgeon: Collene Gobble, MD;  Location: [facility] ENDOSCOPY;  Service: Pulmonary;;  . BRONCHIAL BRUSHINGS  01/07/2020   Procedure: BRONCHIAL BRUSHINGS;  Surgeon: Collene Gobble, MD;  Location: [facility] ENDOSCOPY;  Service: Pulmonary;;  . BRONCHIAL NEEDLE ASPIRATION BIOPSY  01/07/2020   Procedure: BRONCHIAL NEEDLE ASPIRATION BIOPSIES;  Surgeon: Collene Gobble, MD;  Location: [facility] ENDOSCOPY;  Service: Pulmonary;;  . BRONCHIAL WASHINGS  01/07/2020   Procedure: BRONCHIAL  WASHINGS;  Surgeon: Collene Gobble, MD;  Location: [facility] ENDOSCOPY;  Service: Pulmonary;;  . COLONOSCOPY  15 years ago  . HEMOSTASIS CONTROL  01/07/2020   Procedure: HEMOSTASIS CONTROL;  Surgeon: Collene Gobble, MD;  Location: [facility] ENDOSCOPY;  Service: Pulmonary;;  cold saline  . NASAL TURBINATE REDUCTION  2002   Dr.Crossley  . VASECTOMY    . VIDEO BRONCHOSCOPY WITH ENDOBRONCHIAL NAVIGATION N/A 01/07/2020   Procedure: VIDEO BRONCHOSCOPY WITH ENDOBRONCHIAL NAVIGATION;  Surgeon: Collene Gobble, MD;  Location: [place] ENDOSCOPY;  Service: Pulmonary;  Laterality: N/A;  . VIDEO BRONCHOSCOPY WITH ENDOBRONCHIAL ULTRASOUND N/A 01/07/2020   Procedure: VIDEO BRONCHOSCOPY WITH ENDOBRONCHIAL ULTRASOUND;  Surgeon: Collene Gobble, MD;  Location: [place] [facility] ENDOSCOPY;  Service: Pulmonary;  Laterality: N/A;    REVIEW OF SYSTEMS:  Constitutional: positive for fatigue and weight loss Eyes: negative Ears, nose, mouth, throat, and face: negative Respiratory: positive for cough, dyspnea on exertion and pleurisy/chest pain Cardiovascular: negative Gastrointestinal: positive for odynophagia Genitourinary:negative Integument/breast: negative Hematologic/lymphatic: negative Musculoskeletal:negative Neurological: negative Behavioral/Psych: negative Endocrine: negative Allergic/Immunologic: negative   PHYSICAL EXAMINATION: General appearance: alert, cooperative, fatigued and no distress Head: Normocephalic, without obvious abnormality, atraumatic Neck: no adenopathy, no JVD, supple, symmetrical, trachea midline and thyroid not enlarged, symmetric, no tenderness/mass/nodules Lymph nodes: Cervical, supraclavicular, and axillary nodes normal. Resp: clear to auscultation bilaterally Back: symmetric, no curvature. ROM normal. No CVA tenderness. Cardio: regular rate and rhythm, S1, S2 normal, no murmur, click, rub or gallop GI: soft, non-tender; bowel sounds normal; no masses,  no organomegaly Extremities: extremities normal,  atraumatic, no cyanosis or edema Neurologic: Alert and oriented X 3, normal strength and tone. Normal symmetric reflexes. Normal coordination and gait  ECOG PERFORMANCE STATUS: 1 - Symptomatic but completely ambulatory  Blood pressure (!) 91/57, pulse (!) 107, temperature 99.6 F (37.6 C), temperature source Tympanic, resp. rate 20, height 5\' 10"  (1.778 m), weight 198 lb 12.8 oz (90.2 kg), SpO2 97 %.  LABORATORY DATA: Lab Results  Component Value Date   WBC 6.5 03/02/2020   HGB 10.9 (L) 03/02/2020   HCT 30.1 (L) 03/02/2020   MCV 84.3 03/02/2020   PLT 216 03/02/2020      Chemistry      Component Value Date/Time   NA 133 (L) 03/02/2020 0917   K 3.3 (L) 03/02/2020 0917   CL 101 03/02/2020 0917   CO2 23 03/02/2020 0917   BUN 20 03/02/2020 0917   CREATININE 1.84 (H) 03/02/2020 0917   CREATININE 1.26 11/06/2019 1132      Component Value Date/Time   CALCIUM 8.7 (L) 03/02/2020 0917   ALKPHOS 67 03/02/2020 0917   AST 20 03/02/2020 0917   ALT 13 03/02/2020 0917   BILITOT 0.8 03/02/2020 0917       RADIOGRAPHIC STUDIES: No results found.  ASSESSMENT AND PLAN: This is a very pleasant 59 years old white male recently diagnosed with a stage IIb (T3, N0, M0) non-small cell lung cancer,  adenocarcinoma presented with perihilar right upper lobe lung mass with suspicious groundglass opacity in the right middle lobe. The patient is not a good surgical candidate for resection according to Dr. Roxan Hockey. The patient is currently undergoing a course of concurrent chemoradiation with weekly carboplatin for AUC of 2 and paclitaxel 45 mg/M2 status post 3 cycles.  He has been tolerating this treatment well except for fatigue, mild odynophagia and a recent diagnosis of questionable pneumonia. For the pneumonia he is currently undergoing treatment with Levaquin. For the dehydration and lack of appetite as well as renal insufficiency, we will arrange for the patient to receive 1 L of normal saline  in the clinic today. I gave the patient the option of holding his treatment for this week until improvement of his condition but he would like to keep going with the treatment and he will proceed with cycle #4 today. For the odynophagia, I will start the patient on Carafate 1 g p.o. 4 times a day before meals. For the dry cough, I will give him prescription for Hycodan. The patient will come back for follow-up visit in 2 weeks for evaluation and management of any adverse effect of his treatment. He was advised to call immediately if he has any other concerning symptoms in the interval. The patient voices understanding of current disease status and treatment options and is in agreement with the current care plan.  All questions were answered. The patient knows to call the clinic with any problems, questions or concerns. We can certainly see the patient much sooner if necessary.  The total time spent in the appointment was 30 minutes.  Disclaimer: This note was dictated with voice recognition software. Similar sounding words can inadvertently be transcribed and may not be corrected upon review.

## 2020-03-02 NOTE — Progress Notes (Signed)
OK to treat w/ today's VS. Will give 1 liter Normal saline over 2 hours as ordered by Dr. Earlie Server

## 2020-03-02 NOTE — Patient Instructions (Signed)
[facility] Discharge Instructions for Patients Receiving Chemotherapy  Today you received the following chemotherapy agents Paclitaxel (TAXOL) & Carboplatin (PARAPLATIN).  To help prevent nausea and vomiting after your treatment, we encourage you to take your nausea medication as prescribed.  If you develop nausea and vomiting that is not controlled by your nausea medication, call the clinic.   BELOW ARE SYMPTOMS THAT SHOULD BE REPORTED IMMEDIATELY:  *FEVER GREATER THAN 100.5 F  *CHILLS WITH OR WITHOUT FEVER  NAUSEA AND VOMITING THAT IS NOT CONTROLLED WITH YOUR NAUSEA MEDICATION  *UNUSUAL SHORTNESS OF BREATH  *UNUSUAL BRUISING OR BLEEDING  TENDERNESS IN MOUTH AND THROAT WITH OR WITHOUT PRESENCE OF ULCERS  *URINARY PROBLEMS  *BOWEL PROBLEMS  UNUSUAL RASH Items with * indicate a potential emergency and should be followed up as soon as possible.  Feel free to call the clinic should you have any questions or concerns. The clinic phone number is [phone].  Please show the CHEMO ALERT CARD at check-in to the Emergency Department and triage nurse.   

## 2020-03-02 NOTE — Progress Notes (Signed)
Met with patient at registration to obtain signature for [employer].  Patient approved for one-time $1000 [employer] grant to assist with personal expenses while going through treatment. Discussed in detail expenses and how they are covered. He has a copy of the expense sheet and approval letter along with the Outpatient pharmacy information. He received a gift card today from his grant.  He has my card for any additional financial questions or concerns.

## 2020-03-03 ENCOUNTER — Ambulatory Visit
Admission: RE | Admit: 2020-03-03 | Discharge: 2020-03-03 | Disposition: A | Discharge status: Home / Self Care | Payer: BC Managed Care – PPO | Source: Ambulatory Visit | Attending: Radiation Oncology | Admitting: Radiation Oncology

## 2020-03-04 ENCOUNTER — Ambulatory Visit
Admission: RE | Admit: 2020-03-04 | Discharge: 2020-03-04 | Disposition: A | Discharge status: Home / Self Care | Payer: BC Managed Care – PPO | Source: Ambulatory Visit | Attending: Radiation Oncology | Admitting: Radiation Oncology

## 2020-03-04 ENCOUNTER — Other Ambulatory Visit: Payer: Self-pay | Admitting: Internal Medicine

## 2020-03-04 NOTE — Telephone Encounter (Signed)
Dr. Julien Nordmann, For your approval or refusal. Gardiner Rhyme, RN

## 2020-03-05 ENCOUNTER — Ambulatory Visit
Admission: RE | Admit: 2020-03-05 | Discharge: 2020-03-05 | Disposition: A | Discharge status: Home / Self Care | Payer: BC Managed Care – PPO | Source: Ambulatory Visit | Attending: Radiation Oncology | Admitting: Radiation Oncology

## 2020-03-05 ENCOUNTER — Inpatient Hospital Stay: Payer: BC Managed Care – PPO

## 2020-03-05 ENCOUNTER — Telehealth: Payer: Self-pay | Admitting: Medical Oncology

## 2020-03-05 ENCOUNTER — Inpatient Hospital Stay (HOSPITAL_BASED_OUTPATIENT_CLINIC_OR_DEPARTMENT_OTHER): Payer: BC Managed Care – PPO | Admitting: Internal Medicine

## 2020-03-05 ENCOUNTER — Encounter (HOSPITAL_COMMUNITY): Payer: Self-pay

## 2020-03-05 ENCOUNTER — Other Ambulatory Visit: Payer: Self-pay

## 2020-03-05 ENCOUNTER — Ambulatory Visit (HOSPITAL_COMMUNITY)
Admission: RE | Admit: 2020-03-05 | Discharge: 2020-03-05 | Disposition: A | Discharge status: Home / Self Care | Payer: BC Managed Care – PPO | Source: Ambulatory Visit | Attending: Internal Medicine | Admitting: Internal Medicine

## 2020-03-05 ENCOUNTER — Encounter: Payer: Self-pay | Admitting: Internal Medicine

## 2020-03-05 ENCOUNTER — Inpatient Hospital Stay (HOSPITAL_COMMUNITY)
Admission: EM | Admit: 2020-03-05 | Discharge: 2020-03-08 | DRG: 190 | Disposition: A | Discharge status: Home / Self Care | Payer: BC Managed Care – PPO | Attending: Family Medicine | Admitting: Family Medicine

## 2020-03-05 VITALS — BP 82/56 | HR 105 | Temp 98.2°F | Resp 20 | Ht 70.0 in | Wt 198.4 lb

## 2020-03-05 DIAGNOSIS — Z5111 Encounter for antineoplastic chemotherapy: Secondary | ICD-10-CM | POA: Diagnosis not present

## 2020-03-05 DIAGNOSIS — Z87891 Personal history of nicotine dependence: Secondary | ICD-10-CM

## 2020-03-05 DIAGNOSIS — E860 Dehydration: Secondary | ICD-10-CM

## 2020-03-05 DIAGNOSIS — F411 Generalized anxiety disorder: Secondary | ICD-10-CM | POA: Diagnosis present

## 2020-03-05 DIAGNOSIS — D630 Anemia in neoplastic disease: Secondary | ICD-10-CM | POA: Diagnosis present

## 2020-03-05 DIAGNOSIS — J189 Pneumonia, unspecified organism: Secondary | ICD-10-CM | POA: Diagnosis present

## 2020-03-05 DIAGNOSIS — C3491 Malignant neoplasm of unspecified part of right bronchus or lung: Secondary | ICD-10-CM

## 2020-03-05 DIAGNOSIS — K5900 Constipation, unspecified: Secondary | ICD-10-CM | POA: Diagnosis present

## 2020-03-05 DIAGNOSIS — E871 Hypo-osmolality and hyponatremia: Secondary | ICD-10-CM | POA: Diagnosis present

## 2020-03-05 DIAGNOSIS — J440 Chronic obstructive pulmonary disease with acute lower respiratory infection: Secondary | ICD-10-CM | POA: Diagnosis present

## 2020-03-05 DIAGNOSIS — Z79899 Other long term (current) drug therapy: Secondary | ICD-10-CM | POA: Diagnosis not present

## 2020-03-05 DIAGNOSIS — R531 Weakness: Secondary | ICD-10-CM | POA: Diagnosis not present

## 2020-03-05 DIAGNOSIS — Z79891 Long term (current) use of opiate analgesic: Secondary | ICD-10-CM

## 2020-03-05 DIAGNOSIS — E785 Hyperlipidemia, unspecified: Secondary | ICD-10-CM | POA: Diagnosis present

## 2020-03-05 DIAGNOSIS — C3411 Malignant neoplasm of upper lobe, right bronchus or lung: Secondary | ICD-10-CM | POA: Diagnosis not present

## 2020-03-05 DIAGNOSIS — R0602 Shortness of breath: Secondary | ICD-10-CM | POA: Diagnosis present

## 2020-03-05 DIAGNOSIS — Z20822 Contact with and (suspected) exposure to covid-19: Secondary | ICD-10-CM | POA: Diagnosis present

## 2020-03-05 DIAGNOSIS — Z9221 Personal history of antineoplastic chemotherapy: Secondary | ICD-10-CM

## 2020-03-05 DIAGNOSIS — J309 Allergic rhinitis, unspecified: Secondary | ICD-10-CM | POA: Diagnosis present

## 2020-03-05 DIAGNOSIS — K219 Gastro-esophageal reflux disease without esophagitis: Secondary | ICD-10-CM | POA: Diagnosis present

## 2020-03-05 DIAGNOSIS — J441 Chronic obstructive pulmonary disease with (acute) exacerbation: Principal | ICD-10-CM | POA: Diagnosis present

## 2020-03-05 DIAGNOSIS — N529 Male erectile dysfunction, unspecified: Secondary | ICD-10-CM | POA: Diagnosis present

## 2020-03-05 DIAGNOSIS — I10 Essential (primary) hypertension: Secondary | ICD-10-CM | POA: Diagnosis present

## 2020-03-05 DIAGNOSIS — Z923 Personal history of irradiation: Secondary | ICD-10-CM

## 2020-03-05 DIAGNOSIS — D72810 Lymphocytopenia: Secondary | ICD-10-CM | POA: Diagnosis present

## 2020-03-05 DIAGNOSIS — Z22322 Carrier or suspected carrier of Methicillin resistant Staphylococcus aureus: Secondary | ICD-10-CM

## 2020-03-05 DIAGNOSIS — N179 Acute kidney failure, unspecified: Secondary | ICD-10-CM | POA: Diagnosis present

## 2020-03-05 DIAGNOSIS — E538 Deficiency of other specified B group vitamins: Secondary | ICD-10-CM | POA: Diagnosis present

## 2020-03-05 LAB — IRON AND TIBC
Iron: 59 ug/dL (ref 45–182)
Saturation Ratios: 32 % (ref 17.9–39.5)
TIBC: 186 ug/dL — ABNORMAL LOW (ref 250–450)
UIBC: 127 ug/dL

## 2020-03-05 LAB — COMPREHENSIVE METABOLIC PANEL
ALT: 18 U/L (ref 0–44)
AST: 22 U/L (ref 15–41)
Albumin: 2.8 g/dL — ABNORMAL LOW (ref 3.5–5.0)
Alkaline Phosphatase: 55 U/L (ref 38–126)
Anion gap: 13 (ref 5–15)
BUN: 26 mg/dL — ABNORMAL HIGH (ref 6–20)
CO2: 23 mmol/L (ref 22–32)
Calcium: 8 mg/dL — ABNORMAL LOW (ref 8.9–10.3)
Chloride: 95 mmol/L — ABNORMAL LOW (ref 98–111)
Creatinine, Ser: 1.41 mg/dL — ABNORMAL HIGH (ref 0.61–1.24)
GFR, Estimated: 57 mL/min — ABNORMAL LOW (ref >60)
Glucose, Bld: 109 mg/dL — ABNORMAL HIGH (ref 70–99)
Potassium: 3.7 mmol/L (ref 3.5–5.1)
Sodium: 131 mmol/L — ABNORMAL LOW (ref 135–145)
Total Bilirubin: 2.4 mg/dL — ABNORMAL HIGH (ref 0.3–1.2)
Total Protein: 6.4 g/dL — ABNORMAL LOW (ref 6.5–8.1)

## 2020-03-05 LAB — APTT: aPTT: 44 s — ABNORMAL HIGH (ref 24–36)

## 2020-03-05 LAB — RESPIRATORY PANEL BY RT PCR (FLU A&B, COVID)
Influenza A by PCR: NEGATIVE
Influenza B by PCR: NEGATIVE
SARS Coronavirus 2 by RT PCR: NEGATIVE

## 2020-03-05 LAB — CBC WITH DIFFERENTIAL/PLATELET
Abs Immature Granulocytes: 0.23 K/uL — ABNORMAL HIGH (ref 0.00–0.07)
Basophils Absolute: 0 K/uL (ref 0.0–0.1)
Basophils Relative: 1 %
Eosinophils Absolute: 0 K/uL (ref 0.0–0.5)
Eosinophils Relative: 1 %
HCT: 26.7 % — ABNORMAL LOW (ref 39.0–52.0)
Hemoglobin: 9.4 g/dL — ABNORMAL LOW (ref 13.0–17.0)
Immature Granulocytes: 6 %
Lymphocytes Relative: 6 %
Lymphs Abs: 0.3 K/uL — ABNORMAL LOW (ref 0.7–4.0)
MCH: 31.2 pg (ref 26.0–34.0)
MCHC: 35.2 g/dL (ref 30.0–36.0)
MCV: 88.7 fL (ref 80.0–100.0)
Monocytes Absolute: 0.1 K/uL (ref 0.1–1.0)
Monocytes Relative: 3 %
Neutro Abs: 3.5 K/uL (ref 1.7–7.7)
Neutrophils Relative %: 83 %
Platelets: 139 K/uL — ABNORMAL LOW (ref 150–400)
RBC: 3.01 MIL/uL — ABNORMAL LOW (ref 4.22–5.81)
RDW: 13.3 % (ref 11.5–15.5)
WBC: 4.1 K/uL (ref 4.0–10.5)
nRBC: 0 % (ref 0.0–0.2)

## 2020-03-05 LAB — URINALYSIS, ROUTINE W REFLEX MICROSCOPIC
Bilirubin Urine: NEGATIVE
Glucose, UA: NEGATIVE mg/dL
Hgb urine dipstick: NEGATIVE
Ketones, ur: NEGATIVE mg/dL
Leukocytes,Ua: NEGATIVE
Nitrite: NEGATIVE
Protein, ur: NEGATIVE mg/dL
Specific Gravity, Urine: 1.011 (ref 1.005–1.030)
pH: 5 (ref 5.0–8.0)

## 2020-03-05 LAB — RETICULOCYTES
Immature Retic Fract: 2.5 % (ref 2.3–15.9)
RBC.: 3.01 MIL/uL — ABNORMAL LOW (ref 4.22–5.81)
Retic Count, Absolute: 14.7 10*3/uL — ABNORMAL LOW (ref 19.0–186.0)
Retic Ct Pct: 0.5 % (ref 0.4–3.1)

## 2020-03-05 LAB — FERRITIN: Ferritin: 1074 ng/mL — ABNORMAL HIGH (ref 24–336)

## 2020-03-05 LAB — PROTIME-INR
INR: 1.2 (ref 0.8–1.2)
Prothrombin Time: 14.7 s (ref 11.4–15.2)

## 2020-03-05 LAB — TROPONIN I (HIGH SENSITIVITY)
Troponin I (High Sensitivity): <2 ng/L (ref <18)
Troponin I (High Sensitivity): 2 ng/L (ref <18)

## 2020-03-05 LAB — FOLATE: Folate: 5.2 ng/mL — ABNORMAL LOW (ref >5.9)

## 2020-03-05 LAB — LACTIC ACID, PLASMA: Lactic Acid, Venous: 1.1 mmol/L (ref 0.5–1.9)

## 2020-03-05 LAB — MAGNESIUM: Magnesium: 1.6 mg/dL — ABNORMAL LOW (ref 1.7–2.4)

## 2020-03-05 IMAGING — DX DG CHEST 1V
1 series · 1 of 1 positions shown · non-contrast
Comparison: [DATE]

CLINICAL DATA: History of lung cancer. Shortness of breath, fever
and weakness.

EXAM:
CHEST  1 VIEW

[chest pa]
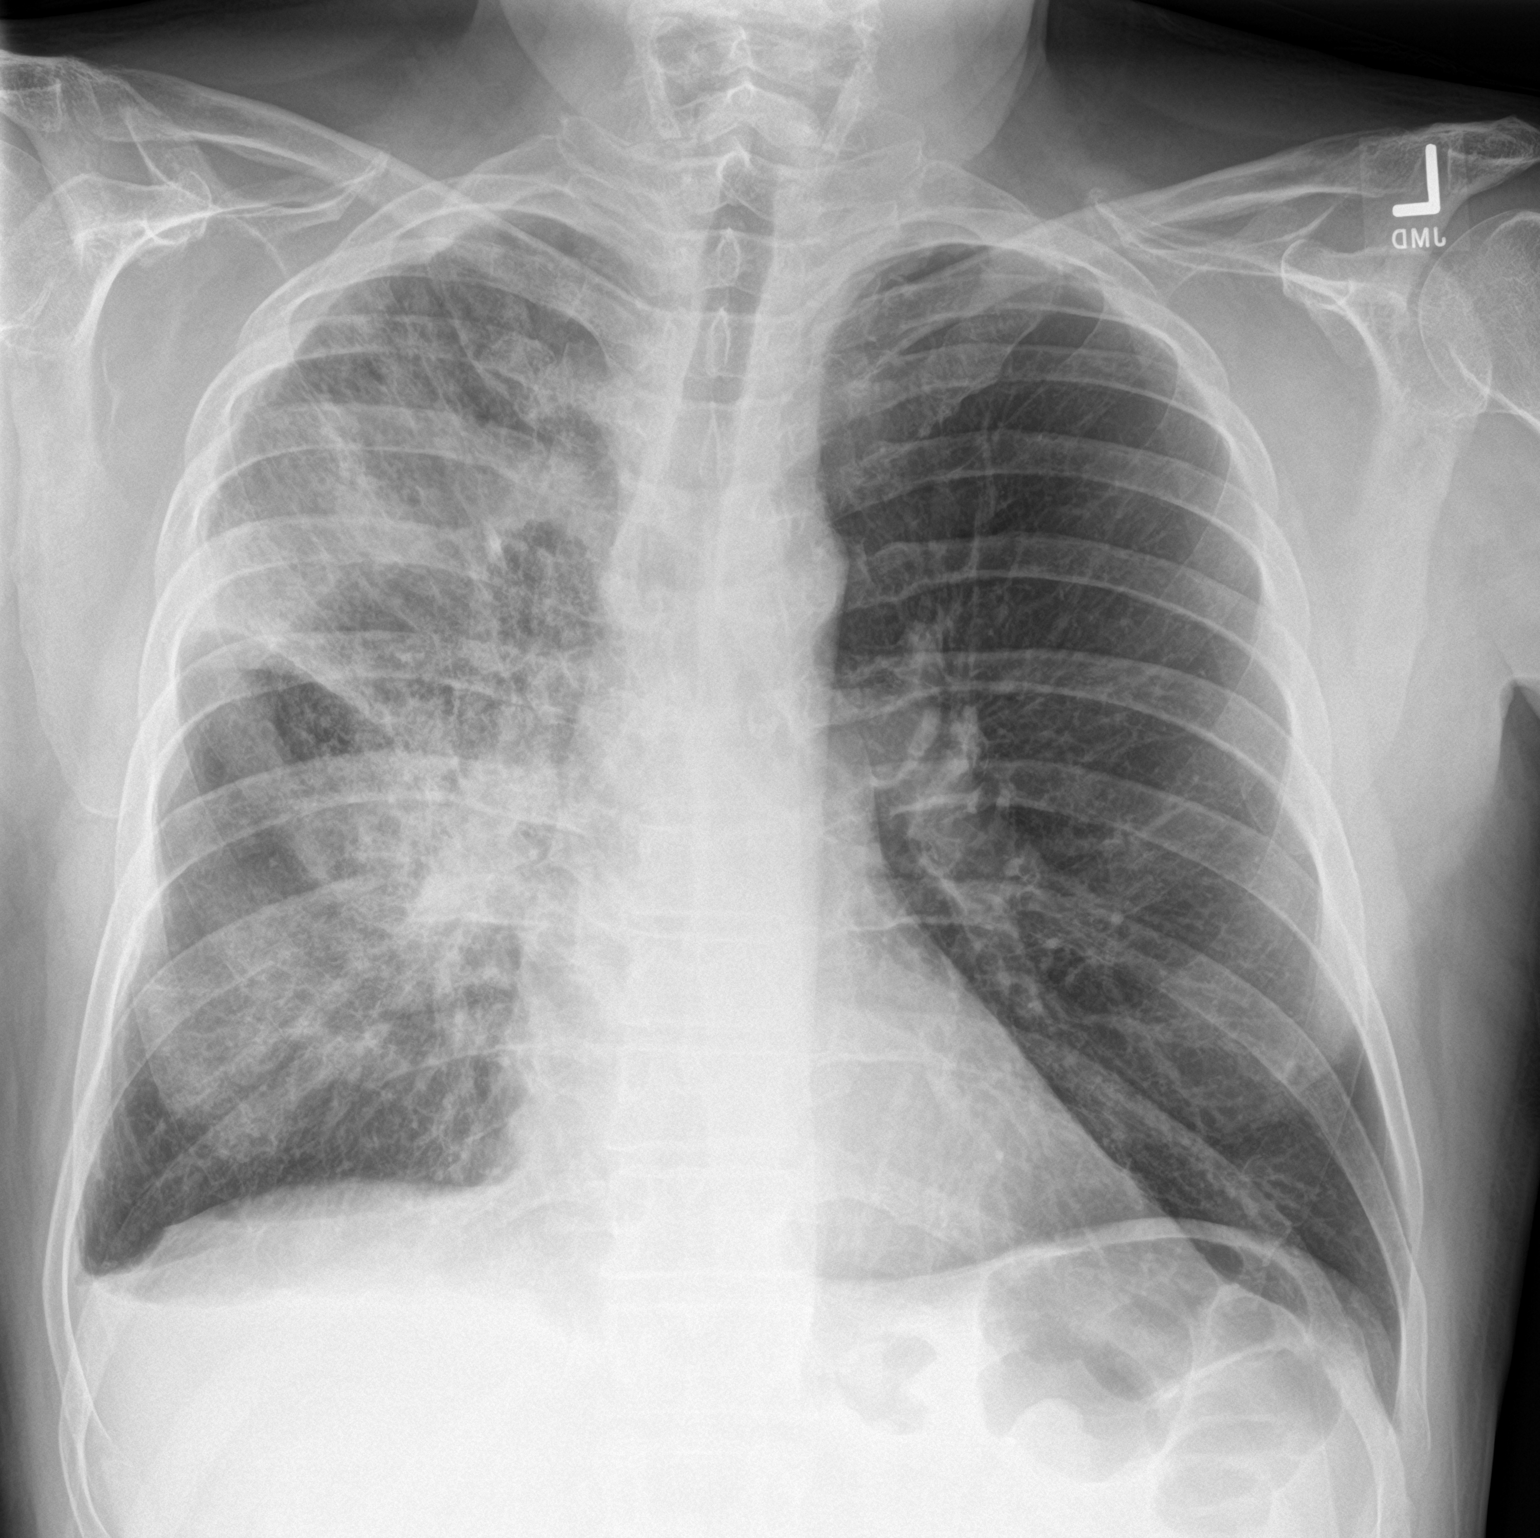

[1 of 1 positions shown; findings below may reference images not displayed]

FINDINGS: Heart size is normal. Left lung is clear. Pulmonary infiltrate
affecting the right lower lobe and upper lobe consistent with
bronchopneumonia. Cannot rule out an element of radiation affect or
interstitial spread of tumor, but due to the extensive nature of the
findings, infectious pneumonia seems likely. Small amount of pleural
fluid on the right.
IMPRESSION: Extensive infiltrate in the right upper and lower lobes consistent
with infectious pneumonia. Small amount of pleural fluid on the
right.

## 2020-03-05 MED ORDER — SODIUM CHLORIDE 0.9 % IV SOLN
500.0000 mg | INTRAVENOUS | Status: DC
  Administered 2020-03-05: 500 mg via INTRAVENOUS
  Filled 2020-03-05: qty 500

## 2020-03-05 MED ORDER — METHYLPREDNISOLONE SODIUM SUCC 40 MG IJ SOLR
40.0000 mg | Freq: Four times a day (QID) | INTRAMUSCULAR | Status: DC
  Administered 2020-03-05 – 2020-03-06 (×4): 40 mg via INTRAVENOUS
  Filled 2020-03-05 (×4): qty 1

## 2020-03-05 MED ORDER — ENOXAPARIN SODIUM 40 MG/0.4ML ~~LOC~~ SOLN
40.0000 mg | SUBCUTANEOUS | Status: DC
  Administered 2020-03-06 – 2020-03-08 (×3): 40 mg via SUBCUTANEOUS
  Filled 2020-03-05 (×3): qty 0.4

## 2020-03-05 MED ORDER — IPRATROPIUM-ALBUTEROL 20-100 MCG/ACT IN AERS
1.0000 | INHALATION_SPRAY | Freq: Four times a day (QID) | RESPIRATORY_TRACT | Status: DC
  Administered 2020-03-05 – 2020-03-08 (×10): 1 via RESPIRATORY_TRACT
  Filled 2020-03-05: qty 4

## 2020-03-05 MED ORDER — ONDANSETRON HCL 4 MG/2ML IJ SOLN: 4.0000 mg | Freq: Four times a day (QID) | INTRAMUSCULAR | Status: DC | PRN

## 2020-03-05 MED ORDER — GUAIFENESIN ER 600 MG PO TB12
600.0000 mg | ORAL_TABLET | Freq: Every day | ORAL | Status: DC | PRN
  Administered 2020-03-05: 600 mg via ORAL
  Filled 2020-03-05: qty 1

## 2020-03-05 MED ORDER — LACTATED RINGERS IV SOLN: INTRAVENOUS | Status: DC

## 2020-03-05 MED ORDER — SODIUM CHLORIDE 0.9 % IV SOLN
2.0000 g | INTRAVENOUS | Status: DC
  Administered 2020-03-05: 2 g via INTRAVENOUS
  Filled 2020-03-05: qty 20

## 2020-03-05 MED ORDER — SODIUM CHLORIDE 0.9 % IV SOLN
INTRAVENOUS | Status: AC
  Filled 2020-03-05: qty 250

## 2020-03-05 MED ORDER — SODIUM CHLORIDE 0.9 % IV SOLN
2.0000 g | Freq: Three times a day (TID) | INTRAVENOUS | Status: DC
  Administered 2020-03-05 – 2020-03-08 (×8): 2 g via INTRAVENOUS
  Filled 2020-03-05 (×9): qty 2

## 2020-03-05 MED ORDER — LORAZEPAM 2 MG/ML IJ SOLN: 0.5000 mg | Freq: Three times a day (TID) | INTRAMUSCULAR | Status: DC | PRN

## 2020-03-05 MED ORDER — FENOFIBRATE 160 MG PO TABS
160.0000 mg | ORAL_TABLET | Freq: Every day | ORAL | Status: DC
  Administered 2020-03-06 – 2020-03-08 (×3): 160 mg via ORAL
  Filled 2020-03-05 (×4): qty 1

## 2020-03-05 MED ORDER — ACETAMINOPHEN 325 MG PO TABS
650.0000 mg | ORAL_TABLET | Freq: Four times a day (QID) | ORAL | Status: DC | PRN
  Administered 2020-03-06 – 2020-03-08 (×4): 650 mg via ORAL
  Filled 2020-03-05 (×5): qty 2

## 2020-03-05 MED ORDER — LACTATED RINGERS IV BOLUS (SEPSIS)
1000.0000 mL | Freq: Once | INTRAVENOUS | Status: AC
  Administered 2020-03-05: 1000 mL via INTRAVENOUS

## 2020-03-05 MED ORDER — LACTATED RINGERS IV SOLN: INTRAVENOUS | Status: AC

## 2020-03-05 MED ORDER — BENZONATATE 100 MG PO CAPS
100.0000 mg | ORAL_CAPSULE | Freq: Three times a day (TID) | ORAL | Status: DC | PRN
  Administered 2020-03-06: 100 mg via ORAL
  Filled 2020-03-05: qty 1

## 2020-03-05 MED ORDER — ONDANSETRON HCL 4 MG/2ML IJ SOLN
INTRAMUSCULAR | Status: AC
  Filled 2020-03-05: qty 4

## 2020-03-05 MED ORDER — VANCOMYCIN HCL 1750 MG/350ML IV SOLN
1750.0000 mg | Freq: Every day | INTRAVENOUS | Status: DC
  Administered 2020-03-05 – 2020-03-07 (×3): 1750 mg via INTRAVENOUS
  Filled 2020-03-05 (×3): qty 350

## 2020-03-05 MED ORDER — ALBUTEROL SULFATE HFA 108 (90 BASE) MCG/ACT IN AERS: 1.0000 | INHALATION_SPRAY | RESPIRATORY_TRACT | Status: DC | PRN

## 2020-03-05 MED ORDER — ACETAMINOPHEN 650 MG RE SUPP: 650.0000 mg | Freq: Four times a day (QID) | RECTAL | Status: DC | PRN

## 2020-03-05 MED ORDER — MELATONIN 3 MG PO TABS
3.0000 mg | ORAL_TABLET | Freq: Every evening | ORAL | Status: DC | PRN
  Administered 2020-03-06 – 2020-03-07 (×2): 3 mg via ORAL
  Filled 2020-03-05 (×2): qty 1

## 2020-03-05 MED ORDER — ONDANSETRON HCL 4 MG/2ML IJ SOLN
8.0000 mg | Freq: Once | INTRAMUSCULAR | Status: AC
  Administered 2020-03-05: 8 mg via INTRAVENOUS

## 2020-03-05 NOTE — ED Provider Notes (Signed)
[facility] DEPT Provider Note   CSN: [id number] Arrival date & time: 03/05/20  1457     History Chief Complaint  Patient presents with  . Shortness of Breath  . Fatigue    Richard Li is a 59 y.o. male.  Patient is a 59 year old male with a history of stage II lung cancer currently on chemotherapy and radiation, COPD, hypertension who is presenting today from Dr. Worthy Flank office for persistent fatigue, vomiting, weakness, productive cough and low-grade fever.  Patient reports approximately 2 weeks ago or when his symptoms started.  Last week he was started on Levaquin which she has been taking for at least 5 to 6 days but followed up with Dr. Earlie Server today because he was not feeling any better.  Patient reports that the mucus he is coughing up is a yellow color but does not have any blood.  He has no abdominal pain but he has been vomiting and poor oral intake.  He is also been having constipation.  He also reports since he has been taking the cough medicine Hycodan he has been having hallucinations and been extremely fatigued.  He denies any unilateral leg pain or swelling and has no prior history of PE.  He is planning on having surgery for the lung mass but has to undergo chemo and radiation first.  The history is provided by the patient and medical records.  Shortness of Breath Severity:  Moderate      Past Medical History:  Diagnosis Date  . Allergic rhinitis, cause unspecified   . Anxiety state, unspecified   . Asthma    as a child  . Chronic airway obstruction, not elsewhere classified   . Esophageal reflux   . History of kidney stones   . Hypertension   . Lumbago   . Other and unspecified hyperlipidemia   . Other chest pain     Patient Active Problem List   Diagnosis Date Noted  . Dehydration 03/05/2020  . Encounter for antineoplastic chemotherapy 01/30/2020  . Sinus congestion 01/22/2020  . Adenocarcinoma of right lung,  stage 2 ([place]) 01/17/2020  . Pulmonary nodules/lesions, multiple 01/07/2020  . 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 12/21/2019  . Thoracic aortic atherosclerosis ([place]) 12/21/2019  . Erectile dysfunction 11/06/2019  . Fatigue 10/16/2019  . Insomnia 09/10/2019  . BPPV (benign paroxysmal positional vertigo) 08/03/2017  . Physical exam, annual 03/30/2015  . Essential hypertension 06/03/2014  . Overweight 12/25/2012  . Kidney stone 12/25/2012  . GERD 01/27/2010  . Hyperlipidemia 12/11/2009  . GAD (generalized anxiety disorder) 11/17/2007  . Anxiety state 11/17/2007  . Allergic rhinitis 02/26/2007  . 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[place]) 02/26/2007  . ABNORMAL CHEST XRAY 02/26/2007    Past Surgical History:  Procedure Laterality Date  . APPENDECTOMY    . BRONCHIAL BIOPSY  01/07/2020   Procedure: BRONCHIAL BIOPSIES;  Surgeon: Collene Gobble, MD;  Location: [facility] ENDOSCOPY;  Service: Pulmonary;;  . BRONCHIAL BRUSHINGS  01/07/2020   Procedure: BRONCHIAL BRUSHINGS;  Surgeon: Collene Gobble, MD;  Location: [facility] ENDOSCOPY;  Service: Pulmonary;;  . BRONCHIAL NEEDLE ASPIRATION BIOPSY  01/07/2020   Procedure: BRONCHIAL NEEDLE ASPIRATION BIOPSIES;  Surgeon: Collene Gobble, MD;  Location: [facility] ENDOSCOPY;  Service: Pulmonary;;  . BRONCHIAL WASHINGS  01/07/2020   Procedure: BRONCHIAL WASHINGS;  Surgeon: Collene Gobble, MD;  Location: [facility] ENDOSCOPY;  Service: Pulmonary;;  . COLONOSCOPY  15 years ago  . HEMOSTASIS CONTROL  01/07/2020  Procedure: HEMOSTASIS CONTROL;  Surgeon: Collene Gobble, MD;  Location: [facility] ENDOSCOPY;  Service: Pulmonary;;  cold saline  . NASAL TURBINATE REDUCTION  2002   Dr.Crossley  . VASECTOMY    . VIDEO BRONCHOSCOPY WITH ENDOBRONCHIAL NAVIGATION N/A 01/07/2020   Procedure: VIDEO BRONCHOSCOPY WITH ENDOBRONCHIAL NAVIGATION;  Surgeon: Collene Gobble, MD;  Location: [facility] ENDOSCOPY;  Service: Pulmonary;  Laterality: N/A;  . VIDEO BRONCHOSCOPY WITH  ENDOBRONCHIAL ULTRASOUND N/A 01/07/2020   Procedure: VIDEO BRONCHOSCOPY WITH ENDOBRONCHIAL ULTRASOUND;  Surgeon: Collene Gobble, MD;  Location: [facility] ENDOSCOPY;  Service: Pulmonary;  Laterality: N/A;       Family History  Problem Relation Age of Onset  . Breast cancer Mother   . Cancer Father   . Heart attack Brother   . Emphysema Maternal Uncle   . COPD Maternal Uncle   . Heart disease Maternal Uncle   . Colon cancer Neg Hx     Social History   Tobacco Use  . Smoking status: Former Smoker    Packs/day: 2.00    Years: 28.00    Pack years: 56.00    Types: Cigarettes    Quit date: 04/25/2005    Years since quitting: 14.8  . Smokeless tobacco: Never Used  Vaping Use  . Vaping Use: Never used  Substance Use Topics  . Alcohol use: Yes    Alcohol/week: 12.0 standard drinks    Types: 12 Cans of beer per week    Comment: social use  . Drug use: No    Home Medications Prior to Admission medications   Medication Sig Start Date End Date Taking? Authorizing Provider  DUEXIS 800-26.6 MG TABS Take 1 tablet by mouth 3 (three) times daily as needed (pain.).  10/31/19   [provider]  fenofibrate 160 MG tablet Take 1 tablet (160 mg total) by mouth daily. 04/17/19   [name], DO  guaiFENesin (MUCINEX) 600 MG 12 hr tablet Take 600 mg by mouth every morning.    [provider]  HYDROcodone-homatropine (HYCODAN) 5-1.5 MG/5ML syrup TAKE 5 MLS BY MOUTH EVERY 6 HOURS AS NEEDED FOR COUGH Patient not taking: Reported on 03/05/2020 03/05/20   Curt Bears, MD  Ipratropium-Albuterol (COMBIVENT RESPIMAT) 20-100 MCG/ACT AERS respimat Inhale 1-2 puffs into the lungs every 6 (six) hours as needed for wheezing. 01/22/20   [name], DO  LORazepam (ATIVAN) 1 MG tablet TAKE 1 TABLET BY MOUTH THREE TIMES DAILY AS NEEDED FOR ANXIETY 01/27/20   [name], DO  losartan (COZAAR) 100 MG tablet Take 1 tablet (100 mg total) by mouth daily. 04/17/19   [name], DO  meclizine  (ANTIVERT) 25 MG tablet Take 1 tablet (25 mg total) by mouth every 6 (six) hours as needed for dizziness. 01/07/20   Collene Gobble, MD  prochlorperazine (COMPAZINE) 10 MG tablet Take 1 tablet (10 mg total) by mouth every 6 (six) hours as needed for nausea or vomiting. 01/30/20   Curt Bears, MD  sildenafil (VIAGRA) 100 MG tablet Take 0.5-1 tablets (50-100 mg total) by mouth daily as needed for erectile dysfunction. 11/06/19   [name], DO  sucralfate (CARAFATE) 1 g tablet Take 1 tablet (1 g total) by mouth 4 (four) times daily -  with meals and at bedtime. 03/02/20   Curt Bears, MD  traZODone (DESYREL) 50 MG tablet TAKE 1 TO 2 TABLETS BY MOUTH AT BEDTIME AS NEEDED FOR SLEEP 01/28/20   [name], DO    Allergies    Patient has no known  allergies.  Review of Systems   Review of Systems  Respiratory: Positive for shortness of breath.   All other systems reviewed and are negative.   Physical Exam Updated Vital Signs BP (!) 143/68 (BP Location: Right Arm)   Pulse (!) 101   Temp 98 F (36.7 C) (Oral)   Resp (!) 24   SpO2 98%   Physical Exam Vitals and nursing note reviewed.  Constitutional:      General: He is not in acute distress.    Appearance: Normal appearance. He is well-developed and normal weight.  HENT:     Head: Normocephalic and atraumatic.  Eyes:     Conjunctiva/sclera: Conjunctivae normal.     Pupils: Pupils are equal, round, and reactive to light.  Cardiovascular:     Rate and Rhythm: Regular rhythm. Tachycardia present.     Pulses: Normal pulses.     Heart sounds: No murmur heard.   Pulmonary:     Effort: Pulmonary effort is normal. Tachypnea present. No respiratory distress.     Breath sounds: Examination of the right-upper field reveals decreased breath sounds. Decreased breath sounds present. No wheezing or rales.  Abdominal:     General: There is no distension.     Palpations: Abdomen is soft.     Tenderness: There is no abdominal  tenderness. There is no guarding or rebound.  Musculoskeletal:        General: No tenderness. Normal range of motion.     Cervical back: Normal range of motion and neck supple.     Right lower leg: No edema.     Left lower leg: No edema.  Skin:    General: Skin is warm and dry.     Capillary Refill: Capillary refill takes 2 to 3 seconds.     Findings: No erythema or rash.     Comments: Poor skin turgor  Neurological:     General: No focal deficit present.     Mental Status: He is alert and oriented to person, place, and time. Mental status is at baseline.  Psychiatric:        Mood and Affect: Mood normal.        Behavior: Behavior normal.        Thought Content: Thought content normal.     ED Results / Procedures / Treatments   Labs (all labs ordered are listed, but only abnormal results are displayed) Labs Reviewed  COMPREHENSIVE METABOLIC PANEL - Abnormal; Notable for the following components:      Result Value   Sodium 131 (*)    Chloride 95 (*)    Glucose, Bld 109 (*)    BUN 26 (*)    Creatinine, Ser 1.41 (*)    Calcium 8.0 (*)    Total Protein 6.4 (*)    Albumin 2.8 (*)    Total Bilirubin 2.4 (*)    GFR, Estimated 57 (*)    All other components within normal limits  CBC WITH DIFFERENTIAL/PLATELET - Abnormal; Notable for the following components:   RBC 3.01 (*)    Hemoglobin 9.4 (*)    HCT 26.7 (*)    Platelets 139 (*)    Lymphs Abs 0.3 (*)    Abs Immature Granulocytes 0.23 (*)    All other components within normal limits  APTT - Abnormal; Notable for the following components:   aPTT 44 (*)    All other components within normal limits  CULTURE, BLOOD (ROUTINE X 2)  CULTURE, BLOOD (ROUTINE X 2)  URINE CULTURE  LACTIC ACID, PLASMA  PROTIME-INR  LACTIC ACID, PLASMA  URINALYSIS, ROUTINE W REFLEX MICROSCOPIC  TROPONIN I (HIGH SENSITIVITY)  TROPONIN I (HIGH SENSITIVITY)    EKG EKG Interpretation  Date/Time:  Thursday March 05 2020 17:19:58  EST Ventricular Rate:  103 PR Interval:    QRS Duration: 87 QT Interval:  331 QTC Calculation: 434 R Axis:   71 Text Interpretation: Sinus tachycardia Prolonged PR interval Nonspecific ST abnormality Inferior leads No previous tracing Confirmed by Blanchie Dessert [phone]) on 03/05/2020 5:24:46 PM   Radiology DG Chest 1 View  Result Date: 03/05/2020 CLINICAL DATA:  History of lung cancer. Shortness of breath, fever and weakness. EXAM: CHEST  1 VIEW COMPARISON:  01/07/2020 FINDINGS: Heart size is normal. Left lung is clear. Pulmonary infiltrate affecting the right lower lobe and upper lobe consistent with bronchopneumonia. Cannot rule out an element of radiation affect or interstitial spread of tumor, but due to the extensive nature of the findings, infectious pneumonia seems likely. Small amount of pleural fluid on the right. IMPRESSION: Extensive infiltrate in the right upper and lower lobes consistent with infectious pneumonia. Small amount of pleural fluid on the right. Electronically Signed   By: Nelson Chimes M.D.   On: 03/05/2020 11:44    Procedures Procedures (including critical care time)  Medications Ordered in ED Medications  lactated ringers infusion (has no administration in time range)  lactated ringers bolus 1,000 mL (has no administration in time range)    And  lactated ringers bolus 1,000 mL (has no administration in time range)  cefTRIAXone (ROCEPHIN) 2 g in sodium chloride 0.9 % 100 mL IVPB (has no administration in time range)  azithromycin (ZITHROMAX) 500 mg in sodium chloride 0.9 % 250 mL IVPB (has no administration in time range)    ED Course  I have reviewed the triage vital signs and the nursing notes.  Pertinent labs & imaging results that were available during my care of the patient were reviewed by me and considered in my medical decision making (see chart for details).    MDM Rules/Calculators/A&P                          Patient is a 59 year old male  presenting today from oncology office for persistent malaise, productive cough and fever.  Patient has been vomiting and in the office he was hypotensive and tachycardic.  Patient is complaining of shortness of breath but is satting 95% on room air currently with respiratory rate in the low 20s.  Patient had an x-ray today done in the office that showed extensive infiltrate in the right upper lobe and lower lobe consistent with bacterial pneumonia.  Patient does have decreased breath sounds in this area.  He has failed outpatient management with Levaquin.  Patient given Rocephin and azithromycin.  Code sepsis initiated.  He received 1 L of fluid in the office so to get 30/kg he needs 2 more liters here.  Labs are pending.  7:18 PM Patient CBC is reassuring with no leukopenia at this time, CMP with stable renal function.  Lactate is within normal limits.  Patient has received IV antibiotics.  Will admit for further care.  MDM Number of Diagnoses or Management Options   Amount and/or Complexity of Data Reviewed Clinical lab tests: ordered and reviewed Tests in the radiology section of CPT: ordered and reviewed Tests in the medicine section of CPT: ordered and reviewed Decide to obtain previous medical  records or to obtain history from someone other than the patient: yes Obtain history from someone other than the patient: yes Review and summarize past medical records: yes Discuss the patient with other providers: yes Independent visualization of images, tracings, or specimens: yes  Risk of Complications, Morbidity, and/or Mortality Presenting problems: moderate Diagnostic procedures: low Management options: low  Patient Progress Patient progress: improved   Final Clinical Impression(s) / ED Diagnoses Final diagnoses:  Community acquired pneumonia, unspecified laterality    Rx / DC Orders ED Discharge Orders    None       Blanchie Dessert, MD 03/05/20 1919

## 2020-03-05 NOTE — Progress Notes (Signed)
Notified bedside nurse of need to draw repeat lactic acid. 

## 2020-03-05 NOTE — Progress Notes (Signed)
Pt transported to ED via wheelchair.

## 2020-03-05 NOTE — Progress Notes (Addendum)
Pharmacy Antibiotic Note  Richard Li is a 59 y.o. male currently undergoing chemo for lung cancer, admitted on 03/05/2020 with pneumonia.  Pharmacy has been consulted for vancomycin and cefepime dosing.  Plan:  Vancomycin 1750 mg IV q24 hr  Measure vancomycin level at steady state as indicated  SCr q48 while on vanc  F/u MRSA PCR; narrow vanc as able  Cefepime 2 g IV q8 hr - will follow peripherally     Temp (24hrs), Avg:98.5 F (36.9 C), Min:98 F (36.7 C), Max:99.2 F (37.3 C)  Recent Labs  Lab 03/02/20 0917 03/05/20 1708  WBC 6.5 4.1  CREATININE 1.84* 1.41*  LATICACIDVEN  --  1.1    Estimated Creatinine Clearance: 63.7 mL/min (A) (by C-G formula based on SCr of 1.41 mg/dL (H)).    No Known Allergies  Antimicrobials this admission: 11/11 vanc >>  11/11 cefepime >>   Dose adjustments this admission: n/a  Microbiology results: 11/11 BCx: sent 11/11 UCx: sent  11/11 MRSA PCR: sent  Thank you for allowing pharmacy to be a part of this patient's care.  Klara Stjames A 03/05/2020 8:20 PM

## 2020-03-05 NOTE — ED Notes (Signed)
Lab notified to add on troponin to previously collected sample.

## 2020-03-05 NOTE — Progress Notes (Signed)
[facility] [phone]   [phone]  OFFICE PROGRESS NOTE  [name], DO [place] [address]  DIAGNOSIS: Stage IIB (T3, N0, M0) non-small cell lung cancer, adenocarcinoma presented with large central perihilar mass with suspicious groundglass opacity in the right middle lobe and left upper lobe diagnosed in September 2021.  PRIOR THERAPY:None.   CURRENT THERAPY: Concurrent chemoradiation with weekly carboplatin for AUC of 2 and paclitaxel 45 mg/M2.  First dose February 10, 2020.  Status post 4  cycles.  INTERVAL HISTORY: Richard Li 59 y.o. male presents to the clinic today for follow-up visit accompanied by his wife. The patient continues to complain of increasing fatigue and weakness as well as hallucination and nausea after he took his pain medication as well as Hycodan. He has lack of appetite and poor p.o. intake. He has low-grade fever. The patient does not feel well today and he came for a symptom management visit. He has been coughing all night and taken cough drops. He was treated with Levaquin for questionable pneumonia. The patient is here today for reevaluation.  MEDICAL HISTORY: Past Medical History:  Diagnosis Date  . Allergic rhinitis, cause unspecified   . Anxiety state, unspecified   . Asthma    as a child  . Chronic airway obstruction, not elsewhere classified   . Esophageal reflux   . History of kidney stones   . Hypertension   . Lumbago   . Other and unspecified hyperlipidemia   . Other chest pain     ALLERGIES:  has no active allergies.  MEDICATIONS:  Current Outpatient Medications  Medication Sig Dispense Refill  . diclofenac Sodium (VOLTAREN) 1 % GEL Apply 1 application topically 3 (three) times daily as needed (pain/muscle aches.).    [name] DUEXIS 800-26.6 MG TABS Take 1 tablet by mouth 3 (three) times daily as needed (pain.).     [name] fenofibrate 160 MG tablet Take 1  tablet (160 mg total) by mouth daily. 90 tablet 3  . guaiFENesin (MUCINEX) 600 MG 12 hr tablet Take 600 mg by mouth every morning.    [name] HYDROcodone-homatropine (HYCODAN) 5-1.5 MG/5ML syrup TAKE 5 MLS BY MOUTH EVERY 6 HOURS AS NEEDED FOR COUGH 120 mL 0  . Ipratropium-Albuterol (COMBIVENT RESPIMAT) 20-100 MCG/ACT AERS respimat Inhale 1-2 puffs into the lungs every 6 (six) hours as needed for wheezing. 4 g 3  . levofloxacin (LEVAQUIN) 500 MG tablet Take 1 tablet (500 mg total) by mouth daily. 10 tablet 0  . LORazepam (ATIVAN) 1 MG tablet TAKE 1 TABLET BY MOUTH THREE TIMES DAILY AS NEEDED FOR ANXIETY 90 tablet 2  . losartan (COZAAR) 100 MG tablet Take 1 tablet (100 mg total) by mouth daily. 90 tablet 3  . meclizine (ANTIVERT) 25 MG tablet Take 1 tablet (25 mg total) by mouth every 6 (six) hours as needed for dizziness.    . prochlorperazine (COMPAZINE) 10 MG tablet Take 1 tablet (10 mg total) by mouth every 6 (six) hours as needed for nausea or vomiting. 30 tablet 0  . sildenafil (VIAGRA) 100 MG tablet Take 0.5-1 tablets (50-100 mg total) by mouth daily as needed for erectile dysfunction. 10 tablet 11  . sucralfate (CARAFATE) 1 g tablet Take 1 tablet (1 g total) by mouth 4 (four) times daily -  with meals and at bedtime. 120 tablet 1  . traZODone (DESYREL) 50 MG tablet TAKE 1 TO 2 TABLETS BY  MOUTH AT BEDTIME AS NEEDED FOR SLEEP 45 tablet 5   No current facility-administered medications for this visit.    SURGICAL HISTORY:  Past Surgical History:  Procedure Laterality Date  . APPENDECTOMY    . BRONCHIAL BIOPSY  01/07/2020   Procedure: BRONCHIAL BIOPSIES;  Surgeon: Collene Gobble, MD;  Location: [facility] ENDOSCOPY;  Service: Pulmonary;;  . BRONCHIAL BRUSHINGS  01/07/2020   Procedure: BRONCHIAL BRUSHINGS;  Surgeon: Collene Gobble, MD;  Location: [facility] ENDOSCOPY;  Service: Pulmonary;;  . BRONCHIAL NEEDLE ASPIRATION BIOPSY  01/07/2020   Procedure: BRONCHIAL NEEDLE ASPIRATION BIOPSIES;  Surgeon: Collene Gobble, MD;  Location: [facility] ENDOSCOPY;  Service: Pulmonary;;  . BRONCHIAL WASHINGS  01/07/2020   Procedure: BRONCHIAL WASHINGS;  Surgeon: Collene Gobble, MD;  Location: [facility] ENDOSCOPY;  Service: Pulmonary;;  . COLONOSCOPY  15 years ago  . HEMOSTASIS CONTROL  01/07/2020   Procedure: HEMOSTASIS CONTROL;  Surgeon: Collene Gobble, MD;  Location: [facility] ENDOSCOPY;  Service: Pulmonary;;  cold saline  . NASAL TURBINATE REDUCTION  2002   Dr.Crossley  . VASECTOMY    . VIDEO BRONCHOSCOPY WITH ENDOBRONCHIAL NAVIGATION N/A 01/07/2020   Procedure: VIDEO BRONCHOSCOPY WITH ENDOBRONCHIAL NAVIGATION;  Surgeon: Collene Gobble, MD;  Location: [facility] ENDOSCOPY;  Service: Pulmonary;  Laterality: N/A;  . VIDEO BRONCHOSCOPY WITH ENDOBRONCHIAL ULTRASOUND N/A 01/07/2020   Procedure: VIDEO BRONCHOSCOPY WITH ENDOBRONCHIAL ULTRASOUND;  Surgeon: Collene Gobble, MD;  Location: [facility] ENDOSCOPY;  Service: Pulmonary;  Laterality: N/A;    REVIEW OF SYSTEMS:  Constitutional: positive for anorexia, fatigue and weight loss Eyes: negative Ears, nose, mouth, throat, and face: negative Respiratory: positive for cough, dyspnea on exertion and pleurisy/chest pain Cardiovascular: negative Gastrointestinal: positive for nausea and odynophagia Genitourinary:negative Integument/breast: negative Hematologic/lymphatic: negative Musculoskeletal:negative Neurological: negative Behavioral/Psych: negative Endocrine: negative Allergic/Immunologic: negative   PHYSICAL EXAMINATION: General appearance: alert, cooperative, fatigued and no distress Head: Normocephalic, without obvious abnormality, atraumatic Neck: no adenopathy, no JVD, supple, symmetrical, trachea midline and thyroid not enlarged, symmetric, no tenderness/mass/nodules Lymph nodes: Cervical, supraclavicular, and axillary nodes normal. Resp: clear to auscultation bilaterally Back: symmetric, no curvature. ROM normal. No CVA tenderness. Cardio: regular rate and rhythm, S1, S2 normal, no  murmur, click, rub or gallop GI: soft, non-tender; bowel sounds normal; no masses,  no organomegaly Extremities: extremities normal, atraumatic, no cyanosis or edema Neurologic: Alert and oriented X 3, normal strength and tone. Normal symmetric reflexes. Normal coordination and gait  ECOG PERFORMANCE STATUS: 1 - Symptomatic but completely ambulatory  Blood pressure (!) 82/56, pulse (!) 105, temperature 98.2 F (36.8 C), temperature source Tympanic, resp. rate 20, height 5\' 10"  (1.778 m), weight 198 lb 6.4 oz (90 kg), SpO2 96 %.  LABORATORY DATA: Lab Results  Component Value Date   WBC 6.5 03/02/2020   HGB 10.9 (L) 03/02/2020   HCT 30.1 (L) 03/02/2020   MCV 84.3 03/02/2020   PLT 216 03/02/2020      Chemistry      Component Value Date/Time   NA 133 (L) 03/02/2020 0917   K 3.3 (L) 03/02/2020 0917   CL 101 03/02/2020 0917   CO2 23 03/02/2020 0917   BUN 20 03/02/2020 0917   CREATININE 1.84 (H) 03/02/2020 0917   CREATININE 1.26 11/06/2019 1132      Component Value Date/Time   CALCIUM 8.7 (L) 03/02/2020 0917   ALKPHOS 67 03/02/2020 0917   AST 20 03/02/2020 0917   ALT 13 03/02/2020 0917   BILITOT 0.8 03/02/2020 6767  RADIOGRAPHIC STUDIES: No results found.  ASSESSMENT AND PLAN: This is a very pleasant 59 years old white male recently diagnosed with a stage IIb (T3, N0, M0) non-small cell lung cancer, adenocarcinoma presented with perihilar right upper lobe lung mass with suspicious groundglass opacity in the right middle lobe. The patient is not a good surgical candidate for resection according to Dr. Roxan Hockey. The patient is currently undergoing a course of concurrent chemoradiation with weekly carboplatin for AUC of 2 and paclitaxel 45 mg/M2 status post 3 cycles.  He has been tolerating this treatment well except for fatigue, mild odynophagia and a recent diagnosis of questionable pneumonia. He was recently treated for the pneumonia with Levaquin under the care of Dr.  Dorathy Daft. I recommended for the patient to have repeat chest x-ray today for further evaluation of his condition and if he has persistent pneumonia he may need to be admitted to the hospital for further management and treatment. For the dehydration and lack of appetite, I will arrange for the patient to receive 1 L of normal saline with Zofran IV today. For the odynophagia he will continue on Carafate. For the dry cough he has prescription for Hycodan but the patient continues to have cough probably secondary to radiation pneumonitis as well as suspicious pneumonia. We will also arrange for the patient to receive IV hydration this weekend. He was advised to call immediately or go to the emergency department if no improvement in his condition. The patient voices understanding of current disease status and treatment options and is in agreement with the current care plan.  All questions were answered. The patient knows to call the clinic with any problems, questions or concerns. We can certainly see the patient much sooner if necessary.  The total time spent in the appointment was 30 minutes.  Disclaimer: This note was dictated with voice recognition software. Similar sounding words can inadvertently be transcribed and may not be corrected upon review.

## 2020-03-05 NOTE — ED Triage Notes (Addendum)
Patient over from cancer center after radiation treatment. Patient diagnosed with questionable pneumonia and given levaquin last week. Patient here for reevaluation. Patient had Xray at cancer center today.   C/o increased fatigue and weakness as well as hallucinations and nausea after he took his pain medication.   C/o poor appetitive and poor PO intake  C/o cough  Hx:non-small cell lung cancer

## 2020-03-05 NOTE — H&P (Signed)
History and Physical    PLEASE NOTE THAT DRAGON DICTATION SOFTWARE WAS USED IN THE CONSTRUCTION OF THIS NOTE.   LYAN Li LAG:[id number] DOB: 1960/09/03 DOA: 03/05/2020  PCP: [name], DO Patient coming from: home   I have personally briefly reviewed patient's old medical records in [place]  Chief Complaint: Shortness of breath  HPI: Richard Li is a 59 y.o. 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   The patient reports 2 weeks of progressive shortness of breath associated with new onset productive cough, subjective fever, fatigue, generalized weakness, intermittent nausea resulting in 3-4 episodes of nonbloody, nonbilious emesis as well as overall decline in oral intake over that timeframe.  He denies any associated orthopnea, PND, or peripheral edema.  Denies any abdominal discomfort, diarrhea, melena, hematochezia, or rash.  Denies any recent headache, neck stiffness, rhinitis, rhinorrhea, sore throat.  He also denies any recent traveling or known COVID-19 exposures.  No recent dysuria, gross hematuria, or change in urinary urgency/frequency.  The patient denies any recent chest pain, diaphoresis, palpitations, dizziness, presyncope, or syncope.  No recent trauma, and denies any associated hemoptysis.  After 3 to 4 days of his initial shortness of breath with productive cough, the patient was diagnosed with community-acquired pneumonia as an outpatient, and started on a 5-day course of Levaquin on 02/25/2020, which she reports that he compliantly completed.  However, after completing this course, the patient noted no improvement in the  above symptoms, instead reported further progression thereafter prompting him to present today to the clinic of his oncologist, Dr. Julien Nordmann.  At that time, he was reportedly noted to be mildly hypotensive, with mild tachycardia, prompting the recommendation that the patient present to the emergency department for further evaluation.   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  He received his most recent radiation on 03/04/2020, and his most recent chemotherapy infusion on 03/02/2020.    ED Course:  Vital signs in the ED were notable for the following: Temperature max 98.4; heart rate 99-1 06; initial blood pressure noted to be 109/60, which increased to 129/72 following interval administration of IV fluids, as further described low; respiratory 19-27; oxygen saturation 98 to 90% on room air.  Labs were notable for the following: CMP notable for the following: Sodium 131 relative to most recent prior value of 133 on 03/02/2020, and relative to 137 on 02/24/2020, potassium 3.7, bicarbonate 23, creatinine 1.41 relative to most recent prior serum creatinine data point of 0.94 on 02/24/2020.  High-sensitivity troponin I x1 is found to be less than 2.  CBC notable for white blood cell count of 4100 with 83% neutrophils, while hemoglobin was found to be 9.4 relative to 10.9 on 03/02/2020; platelets 139.  INR 1.2.  Lactic acid 1.1.  Urinalysis has been ordered, with result currently pending.  Blood cultures x2 were collected prior to initiation of any antibiotics.  COVID-19/influenza PCR were checked in the ED this evening, with results currently pending.  Chest x-ray showed evidence of infiltrates in the right upper lobe and right lower lobe consistent with pneumonia, in addition to demonstrating evidence of a small pleural effusion on  the right, without evidence of pulmonary edema.  Presenting EKG shows sinus  tachycardia with heart rate 103, QTc 434, nonspecific T wave inversions in V1 and V3, of which T wave inversion in V1 is unchanged relative to most recent prior EKG on 02/27/2017, and nonspecific less than 1 mm ST depression in lead II, unchanged from most recent prior EKG. no evidence of ST elevation.  While in the ED, the following were administered: Lactated Ringer's x2 L bolus, followed by transition to lactated Ringer's at 150 cc/h; azithromycin 500 mg IV x1 and Rocephin 2 g IV x1.  Subsequently, the patient was admitted to the med telemetry floor for further evaluation and management of community-acquired pneumonia, having failed appropriate outpatient antibiotics.    Review of Systems: As per HPI otherwise 10 point review of systems negative.   Past Medical History:  Diagnosis Date  . Allergic rhinitis, cause unspecified   . Anxiety state, unspecified   . Asthma    as a child  . Chronic airway obstruction, not elsewhere classified   . Esophageal reflux   . History of kidney stones   . Hypertension   . Lumbago   . Other and unspecified hyperlipidemia   . Other chest pain     Past Surgical History:  Procedure Laterality Date  . APPENDECTOMY    . BRONCHIAL BIOPSY  01/07/2020   Procedure: BRONCHIAL BIOPSIES;  Surgeon: Collene Gobble, MD;  Location: [facility] ENDOSCOPY;  Service: Pulmonary;;  . BRONCHIAL BRUSHINGS  01/07/2020   Procedure: BRONCHIAL BRUSHINGS;  Surgeon: Collene Gobble, MD;  Location: [facility] ENDOSCOPY;  Service: Pulmonary;;  . BRONCHIAL NEEDLE ASPIRATION BIOPSY  01/07/2020   Procedure: BRONCHIAL NEEDLE ASPIRATION BIOPSIES;  Surgeon: Collene Gobble, MD;  Location: [facility] ENDOSCOPY;  Service: Pulmonary;;  . BRONCHIAL WASHINGS  01/07/2020   Procedure: BRONCHIAL WASHINGS;  Surgeon: Collene Gobble, MD;  Location: [facility] ENDOSCOPY;  Service: Pulmonary;;  . COLONOSCOPY  15 years ago  . HEMOSTASIS CONTROL  01/07/2020   Procedure: HEMOSTASIS CONTROL;  Surgeon: Collene Gobble, MD;  Location:  [facility] ENDOSCOPY;  Service: Pulmonary;;  cold saline  . NASAL TURBINATE REDUCTION  2002   Dr.Crossley  . VASECTOMY    . VIDEO BRONCHOSCOPY WITH ENDOBRONCHIAL NAVIGATION N/A 01/07/2020   Procedure: VIDEO BRONCHOSCOPY WITH ENDOBRONCHIAL NAVIGATION;  Surgeon: Collene Gobble, MD;  Location: [place] ENDOSCOPY;  Service: Pulmonary;  Laterality: N/A;  . VIDEO BRONCHOSCOPY WITH ENDOBRONCHIAL ULTRASOUND N/A 01/07/2020   Procedure: VIDEO BRONCHOSCOPY WITH ENDOBRONCHIAL ULTRASOUND;  Surgeon: Collene Gobble, MD;  Location: [place] ENDOSCOPY;  Service: Pulmonary;  Laterality: N/A;    Social History:  reports that he quit smoking about 14 years ago. His smoking use included cigarettes. He has a 56.00 pack-year smoking history. He has never used smokeless tobacco. He reports current alcohol use of about 12.0 standard drinks of alcohol per week. He reports that he does not use drugs.   No Known Allergies  Family History  Problem Relation Age of Onset  . Breast cancer Mother   . Cancer Father   . Heart attack Brother   . Emphysema Maternal Uncle   . COPD Maternal Uncle   . Heart disease Maternal Uncle   . Colon cancer Neg Hx      Prior to Admission medications   Medication Sig Start Date End Date Taking? Authorizing Provider  Brimonidine Tartrate (LUMIFY OP) Place 1 drop into both eyes daily as needed (redness).   Yes [provider]  DUEXIS 800-26.6 MG  TABS Take 1 tablet by mouth 3 (three) times daily as needed (pain.).  10/31/19  Yes [provider]  fenofibrate 160 MG tablet Take 1 tablet (160 mg total) by mouth daily. 04/17/19  Yes [name], DO  guaiFENesin (MUCINEX) 600 MG 12 hr tablet Take 600 mg by mouth daily as needed for cough.    Yes [provider]  HYDROcodone-homatropine (HYCODAN) 5-1.5 MG/5ML syrup TAKE 5 MLS BY MOUTH EVERY 6 HOURS AS NEEDED FOR COUGH Patient taking differently: Take 5 mLs by mouth every 6 (six) hours as needed for cough.  03/05/20  Yes Curt Bears, MD  Ipratropium-Albuterol (COMBIVENT RESPIMAT) 20-100 MCG/ACT AERS respimat Inhale 1-2 puffs into the lungs every 6 (six) hours as needed for wheezing. 01/22/20  Yes [name], DO  levofloxacin (LEVAQUIN) 500 MG tablet Take 500 mg by mouth daily. Start date : 02/25/20   Yes [provider]  LORazepam (ATIVAN) 1 MG tablet TAKE 1 TABLET BY MOUTH THREE TIMES DAILY AS NEEDED FOR ANXIETY Patient taking differently: Take 1 mg by mouth in the morning, at noon, and at bedtime.  01/27/20  Yes [name], DO  losartan (COZAAR) 100 MG tablet Take 1 tablet (100 mg total) by mouth daily. 04/17/19  Yes [name], DO  traZODone (DESYREL) 50 MG tablet TAKE 1 TO 2 TABLETS BY MOUTH AT BEDTIME AS NEEDED FOR SLEEP Patient taking differently: Take 50 mg by mouth at bedtime.  01/28/20  Yes [name], DO  meclizine (ANTIVERT) 25 MG tablet Take 1 tablet (25 mg total) by mouth every 6 (six) hours as needed for dizziness. Patient not taking: Reported on 03/05/2020 01/07/20   Collene Gobble, MD  prochlorperazine (COMPAZINE) 10 MG tablet Take 1 tablet (10 mg total) by mouth every 6 (six) hours as needed for nausea or vomiting. Patient not taking: Reported on 03/05/2020 01/30/20   Curt Bears, MD  sildenafil (VIAGRA) 100 MG tablet Take 0.5-1 tablets (50-100 mg total) by mouth daily as needed for erectile dysfunction. Patient not taking: Reported on 03/05/2020 11/06/19   [name], DO  sucralfate (CARAFATE) 1 g tablet Take 1 tablet (1 g total) by mouth 4 (four) times daily -  with meals and at bedtime. Patient not taking: Reported on 03/05/2020 03/02/20   Curt Bears, MD     Objective    Physical Exam: Vitals:   03/05/20 1615 03/05/20 1630 03/05/20 1645 03/05/20 1745  BP: 135/74 (!) 144/75 (!) 141/74 118/64  Pulse: (!) 103 (!) 106 (!) 105 (!) 105  Resp: (!) 23 19 (!) 21 (!) 21  Temp:      TempSrc:      SpO2: 97% 99% 98% 99%    General: appears to be stated age; alert,  oriented; increased work of breathing noted. Skin: warm, dry, no rash Head:  AT/[place] Mouth:  Oral mucosa membranes appear dry, normal dentition Neck: supple; trachea midline Heart: Mildly tachycardic, but regular; did not appreciate any M/R/G Lungs: Right-sided rales and bilateral expiratory wheezes noted  Abdomen: + BS; soft, ND, NT Vascular: 2+ pedal pulses b/l; 2+ radial pulses b/l Extremities: no peripheral edema, no muscle wasting Neuro: strength and sensation intact in upper and lower extremities b/l  Labs on Admission: I have personally reviewed following labs and imaging studies  CBC: Recent Labs  Lab 03/02/20 0917 03/05/20 1708  WBC 6.5 4.1  NEUTROABS 4.8 3.5  HGB 10.9* 9.4*  HCT 30.1* 26.7*  MCV 84.3 88.7  PLT 216 139*  Basic Metabolic Panel: Recent Labs  Lab 03/02/20 0917 03/05/20 1708  NA 133* 131*  K 3.3* PENDING  CL 101 95*  CO2 23 23  GLUCOSE 112* 109*  BUN 20 26*  CREATININE 1.84* 1.41*  CALCIUM 8.7* 8.0*   GFR: Estimated Creatinine Clearance: 63.7 mL/min (A) (by C-G formula based on SCr of 1.41 mg/dL (H)). Liver Function Tests: Recent Labs  Lab 03/02/20 0917 03/05/20 1708  AST 20 22  ALT 13 18  ALKPHOS 67 55  BILITOT 0.8 2.4*  PROT 6.4* 6.4*  ALBUMIN 2.8* 2.8*   No results for input(s): LIPASE, AMYLASE in the last 168 hours. No results for input(s): AMMONIA in the last 168 hours. Coagulation Profile: Recent Labs  Lab 03/05/20 1708  INR 1.2   Cardiac Enzymes: No results for input(s): CKTOTAL, CKMB, CKMBINDEX, TROPONINI in the last 168 hours. BNP (last 3 results) No results for input(s): PROBNP in the last 8760 hours. HbA1C: No results for input(s): HGBA1C in the last 72 hours. CBG: No results for input(s): GLUCAP in the last 168 hours. Lipid Profile: No results for input(s): CHOL, HDL, LDLCALC, TRIG, CHOLHDL, LDLDIRECT in the last 72 hours. Thyroid Function Tests: No results for input(s): TSH, T4TOTAL, FREET4, T3FREE, THYROIDAB  in the last 72 hours. Anemia Panel: No results for input(s): VITAMINB12, FOLATE, FERRITIN, TIBC, IRON, RETICCTPCT in the last 72 hours. Urine analysis:    Component Value Date/Time   COLORURINE LT. YELLOW 12/12/2011 [place] 12/12/2011 0944   LABSPEC 1.015 12/12/2011 0944   PHURINE 7.0 12/12/2011 0944   GLUCOSEU NEGATIVE 12/12/2011 0944   HGBUR NEGATIVE 12/12/2011 0944   BILIRUBINUR NEGATIVE 12/12/2011 0944   KETONESUR NEGATIVE 12/12/2011 0944   UROBILINOGEN 0.2 12/12/2011 0944   NITRITE NEGATIVE 12/12/2011 0944   LEUKOCYTESUR NEGATIVE 12/12/2011 0944    Radiological Exams on Admission: DG Chest 1 View  Result Date: 03/05/2020 CLINICAL DATA:  History of lung cancer. Shortness of breath, fever and weakness. EXAM: CHEST  1 VIEW COMPARISON:  01/07/2020 FINDINGS: Heart size is normal. Left lung is clear. Pulmonary infiltrate affecting the right lower lobe and upper lobe consistent with bronchopneumonia. Cannot rule out an element of radiation affect or interstitial spread of tumor, but due to the extensive nature of the findings, infectious pneumonia seems likely. Small amount of pleural fluid on the right. IMPRESSION: Extensive infiltrate in the right upper and lower lobes consistent with infectious pneumonia. Small amount of pleural fluid on the right. Electronically Signed   By: Nelson Chimes M.D.   On: 03/05/2020 11:44     EKG: Independently reviewed, with result as described above.    Assessment/Plan   AMADOR BRADDY is a 59 y.o. 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 Department complaining of shortness of breath.    Principal Problem:   CAP (community acquired pneumonia)  Active Problems:   GAD (generalized anxiety disorder)   Essential hypertension   SOB (shortness of breath)   COPD with acute exacerbation (HCC)   Generalized weakness   Acute hyponatremia   AKI (acute kidney injury) ([place])    #) Community-acquired pneumonia: Diagnosis on the basis of 2 weeks of progressive shortness of breath associated with new onset productive cough, subjective fever, generalized weakness, with presenting mild tachycardia, tachypnea, and chest  x-ray evidence of infiltrates in the right upper lobe and right lower lobe consistent with pneumonia.  Of note, the patient was treated as appropriately treated an outpatient with Levaquin for community-acquired pneumonia.  However, in the context of progression of symptoms in spite of compliant completion and appropriate duration of this antibiotic, he is now considered to have failed outpatient antibiotics, prompting hospitalization for broadening of spectrum of IV antibiotics for underlying pneumonia.  As such, will expand antibiotic coverage to include pseudomonal coverage, MRSA coverage, and will continue the a azithromycin initiated in the ED this evening for atypical coverage given the multilobular appearance of the patient's infiltrates.  As he presents with tachycardia and tachypnea in the absence of objective fever, leukocytosis, or leukopenia, criteria are not met at this time for sepsis.  Presenting lactic acid found to be nonelevated at 1.1, and the patient is demonstrated no evidence of hypotension in the ED thus far.  Plan: Monitor for results blood cultures x2 collected in the ED this evening prior to initiation of antibiotics.  IV vancomycin, cefepime, and a azithromycin, per rationale above.  Check sputum culture.  Lactated Ringer's at 125 cc/h.  Repeat CBC with differential in the morning.  Monitor continuous pulse oximetry.  Given concomitant nausea/vomiting, will check Legionella urine antigen.  We will also check strep  pneumonia urine antigen.  Incentive spirometry and flutter valve.  As needed Zofran for nausea.  Follow for result of COVID-19 PCR performed in the ED this evening.     #) Acute COPD exacerbation: In the context of a documented history of COPD in the setting of being a former smoker with a prior 60-pack-year history, presentation appears to be associated with an acute COPD exacerbation given the patient's progressive shortness of breath, tachypnea, and wheezing, all in the setting of increased work of breathing.  Of note, he has been maintaining oxygen saturations in the high 90s on room air.  Plan: Slight Medrol 40 mg IV every 6 hours.  Combivent inhaler scheduled every 6 hours.  As needed albuterol inhaler.  Azithromycin as a component of antimicrobial coverage for community-acquired pneumonia, with additional anti-inflammatory benefit.  Monitor continuous pulse oximetry.  Work-up and management of precipitating community-acquired pneumonia, as further described above.  Repeat BMP in the morning.      #) Generalized weakness: The patient reports 10 days of progressive generalized weakness in the absence of any acute focal neurologic deficits.  This appears to on the basis of multiple physiologic stressors, including recent chemotherapy/radiation in addition to presenting community-acquired pneumonia as well as suspected ensuing acute COPD exacerbation.  Additional factors potentially contributing to his generalized weakness also include acute hyponatremia as well as acute anemia, as further described below.  Plan: Work-up and management of community-acquired pneumonia and acute COPD exacerbation, as above.  I have ordered a physical therapy consult to occur in the morning.  Check TSH.      #) Acute hypo-osmolar  hyponatremia: Presenting serum sodium noted to be 131 relative to 137 on 02/24/2020.  Suspect that this may be multifactorial choreal in source, with potential contributions from  extrarenal sodium losses in the setting of dehydration from recent nausea/vomiting as well as potential contribution from SIADH given presenting pneumonia superimposed on right lung adenocarcinoma.  No evidence to suggest volume overload at this time.  As there does appear to be an element of dehydration, will proceed with IV fluids as further described below for volume resuscitative purposes while initiating additional work-up to determine  if there is also a component of SIADH, as further described below.  Plan: Monitor strict I's and O's and daily weights.  We will follow up on result of urinalysis, and add on random urine sodium as well as urine osmolality.  Check serum osmolality to confirm suspected hypoosmolar etiology.  Continuous lactated Ringer's.  Repeat BMP in the morning.  Check TSH.      #) Acute kidney injury: Presenting creatinine noted to be 1.41 relative to most recent prior value of 0.94 on 02/24/2020.  This appears to be prerenal in nature in the setting of intravascular depletion stemming from dehydration due to recent increased gastrointestinal losses as well as diminished oral intake over that timeframe.  Additionally, there may be a pharmacologic exacerbating factor in the form of outpatient losartan.  Plan: IV fluids, as above.  Monitor strict I's and O's and daily weights.  Attempt to avoid nephrotoxic agents.  Repeat BMP in the morning.  Hold home losartan.  Add on random urine sodium as well as random urine creatinine, and follow for results of urinalysis with attention for the presence of urinary casts.     #) Acute normocytic anemia: Presenting CBC reflects hemoglobin of 9.4 relative to 10.9 on 03/02/2020, and relative to historical baseline hemoglobin range of 12-14.  Presenting labs reflect normocytic normochromic findings, with a nonelevated RDW, rendering the possibility of acute blood loss contribution to be less likely.  Suspect that his acute decline in patient's  hemoglobin is on the basis of anemia of acute illness with contribution from presenting acute kidney injury.  No evidence to suggest acute blood loss at this time, and the patient is on no blood thinners at home.  Plan: On the following labs: Ferritin, TIBC, total iron, MMA, folic acid, reticulocyte count.  Additionally, will send peripheral smear to pathology for evaluation.  Check INR.  Repeat CBC in the morning.     #) Essential hypertension: Outpatient antihypertensive regimen is limited to losartan.  Blood pressures in the ED today have been noted to be normotensive.  Plan: In the setting of infectious presentation as well as acute kidney injury, will hold home losartan for now.  Close monitoring of ensuing blood pressures via routine vital signs.     #) Generalized anxiety disorder: The patient reports that he is on scheduled Ativan 1 mg 3 times daily as an outpatient.  Of note, the patient does not appear to be on any scheduled SSRI or SNRI as an outpatient.  Plan: In the context of ongoing nausea, I have ordered Ativan 0.5 mg IV 3 times daily as needed for anxiety.     DVT prophylaxis: Lovenox and SCDs Code Status: Full code Family Communication: none Disposition Plan: Per Rounding Team Consults called: none  Admission status: Inpatient; med telemetry     PLEASE NOTE THAT DRAGON DICTATION SOFTWARE WAS USED IN THE CONSTRUCTION OF THIS NOTE.   [place] [place] [facility] Pager [phone] From [place]  Otherwise, please contact night-coverage  [email or web address] Password [facility]  03/05/2020, 7:04 PM

## 2020-03-05 NOTE — Telephone Encounter (Signed)
Called report to ED RN. I was instructed to take  him to ED  triage

## 2020-03-05 NOTE — Telephone Encounter (Signed)
Walked in reporting not feeling well. Weak , no energy, no appetite , coughing , not seeing things clearly , low grade fever. Vomiting 2-3 days then dry heaves. He thinks his symptoms may be related to Hycodan. Appt with [facility] today .

## 2020-03-06 ENCOUNTER — Ambulatory Visit: Payer: BC Managed Care – PPO

## 2020-03-06 DIAGNOSIS — N179 Acute kidney failure, unspecified: Secondary | ICD-10-CM | POA: Diagnosis present

## 2020-03-06 DIAGNOSIS — R0602 Shortness of breath: Secondary | ICD-10-CM

## 2020-03-06 DIAGNOSIS — J441 Chronic obstructive pulmonary disease with (acute) exacerbation: Secondary | ICD-10-CM | POA: Diagnosis not present

## 2020-03-06 DIAGNOSIS — E871 Hypo-osmolality and hyponatremia: Secondary | ICD-10-CM | POA: Diagnosis not present

## 2020-03-06 DIAGNOSIS — J189 Pneumonia, unspecified organism: Secondary | ICD-10-CM | POA: Diagnosis not present

## 2020-03-06 DIAGNOSIS — R531 Weakness: Secondary | ICD-10-CM

## 2020-03-06 LAB — CBC WITH DIFFERENTIAL/PLATELET
Abs Immature Granulocytes: 0.26 10*3/uL — ABNORMAL HIGH (ref 0.00–0.07)
Basophils Absolute: 0.1 10*3/uL (ref 0.0–0.1)
Basophils Relative: 2 %
Eosinophils Absolute: 0 10*3/uL (ref 0.0–0.5)
Eosinophils Relative: 0 %
HCT: 26.8 % — ABNORMAL LOW (ref 39.0–52.0)
Hemoglobin: 9.4 g/dL — ABNORMAL LOW (ref 13.0–17.0)
Immature Granulocytes: 8 %
Lymphocytes Relative: 3 %
Lymphs Abs: 0.1 10*3/uL — ABNORMAL LOW (ref 0.7–4.0)
MCH: 30.6 pg (ref 26.0–34.0)
MCHC: 35.1 g/dL (ref 30.0–36.0)
MCV: 87.3 fL (ref 80.0–100.0)
Monocytes Absolute: 0.1 10*3/uL (ref 0.1–1.0)
Monocytes Relative: 3 %
Neutro Abs: 2.7 10*3/uL (ref 1.7–7.7)
Neutrophils Relative %: 84 %
Platelets: 120 10*3/uL — ABNORMAL LOW (ref 150–400)
RBC: 3.07 MIL/uL — ABNORMAL LOW (ref 4.22–5.81)
RDW: 13.2 % (ref 11.5–15.5)
WBC: 3.2 10*3/uL — ABNORMAL LOW (ref 4.0–10.5)
nRBC: 0 % (ref 0.0–0.2)

## 2020-03-06 LAB — PROTIME-INR
INR: 1.2 (ref 0.8–1.2)
Prothrombin Time: 14.9 seconds (ref 11.4–15.2)

## 2020-03-06 LAB — PATHOLOGIST SMEAR REVIEW

## 2020-03-06 LAB — COMPREHENSIVE METABOLIC PANEL
ALT: 17 U/L (ref 0–44)
AST: 20 U/L (ref 15–41)
Albumin: 2.5 g/dL — ABNORMAL LOW (ref 3.5–5.0)
Alkaline Phosphatase: 51 U/L (ref 38–126)
Anion gap: 10 (ref 5–15)
BUN: 17 mg/dL (ref 6–20)
CO2: 23 mmol/L (ref 22–32)
Calcium: 8 mg/dL — ABNORMAL LOW (ref 8.9–10.3)
Chloride: 102 mmol/L (ref 98–111)
Creatinine, Ser: 0.94 mg/dL (ref 0.61–1.24)
GFR, Estimated: >60 mL/min (ref >60)
Glucose, Bld: 128 mg/dL — ABNORMAL HIGH (ref 70–99)
Potassium: 3.5 mmol/L (ref 3.5–5.1)
Sodium: 135 mmol/L (ref 135–145)
Total Bilirubin: 2.2 mg/dL — ABNORMAL HIGH (ref 0.3–1.2)
Total Protein: 5.5 g/dL — ABNORMAL LOW (ref 6.5–8.1)

## 2020-03-06 LAB — MRSA PCR SCREENING: MRSA by PCR: POSITIVE — AB

## 2020-03-06 LAB — SODIUM, URINE, RANDOM: Sodium, Ur: 27 mmol/L

## 2020-03-06 LAB — PHOSPHORUS: Phosphorus: 2.8 mg/dL (ref 2.5–4.6)

## 2020-03-06 LAB — EXPECTORATED SPUTUM ASSESSMENT W GRAM STAIN, RFLX TO RESP C

## 2020-03-06 LAB — OSMOLALITY: Osmolality: 278 mOsm/kg (ref 275–295)

## 2020-03-06 LAB — STREP PNEUMONIAE URINARY ANTIGEN: Strep Pneumo Urinary Antigen: NEGATIVE

## 2020-03-06 LAB — CREATININE, URINE, RANDOM: Creatinine, Urine: 50.47 mg/dL

## 2020-03-06 LAB — MAGNESIUM: Magnesium: 1.5 mg/dL — ABNORMAL LOW (ref 1.7–2.4)

## 2020-03-06 LAB — HIV ANTIBODY (ROUTINE TESTING W REFLEX): HIV Screen 4th Generation wRfx: NONREACTIVE

## 2020-03-06 LAB — OSMOLALITY, URINE: Osmolality, Ur: 258 mOsm/kg — ABNORMAL LOW (ref 300–900)

## 2020-03-06 LAB — TSH: TSH: 0.615 u[IU]/mL (ref 0.350–4.500)

## 2020-03-06 MED ORDER — LORAZEPAM 0.5 MG PO TABS
0.5000 mg | ORAL_TABLET | Freq: Three times a day (TID) | ORAL | Status: DC | PRN
  Administered 2020-03-06 – 2020-03-07 (×2): 0.5 mg via ORAL
  Filled 2020-03-06 (×2): qty 1

## 2020-03-06 MED ORDER — MAGNESIUM SULFATE 2 GM/50ML IV SOLN
2.0000 g | Freq: Once | INTRAVENOUS | Status: AC
  Administered 2020-03-06: 2 g via INTRAVENOUS
  Filled 2020-03-06: qty 50

## 2020-03-06 MED ORDER — CHLORHEXIDINE GLUCONATE CLOTH 2 % EX PADS
6.0000 | MEDICATED_PAD | Freq: Every day | CUTANEOUS | Status: DC
  Administered 2020-03-07 – 2020-03-08 (×2): 6 via TOPICAL

## 2020-03-06 MED ORDER — FOLIC ACID 1 MG PO TABS
1.0000 mg | ORAL_TABLET | Freq: Every day | ORAL | Status: DC
  Administered 2020-03-06 – 2020-03-08 (×3): 1 mg via ORAL
  Filled 2020-03-06 (×3): qty 1

## 2020-03-06 MED ORDER — MUPIROCIN 2 % EX OINT
1.0000 "application " | TOPICAL_OINTMENT | Freq: Two times a day (BID) | CUTANEOUS | Status: DC
  Administered 2020-03-06 – 2020-03-07 (×3): 1 via NASAL
  Filled 2020-03-06: qty 22

## 2020-03-06 MED ORDER — GUAIFENESIN ER 600 MG PO TB12
600.0000 mg | ORAL_TABLET | Freq: Every day | ORAL | Status: DC | PRN
  Administered 2020-03-06 – 2020-03-08 (×2): 600 mg via ORAL
  Filled 2020-03-06 (×2): qty 1

## 2020-03-06 MED ORDER — BENZONATATE 100 MG PO CAPS
100.0000 mg | ORAL_CAPSULE | Freq: Three times a day (TID) | ORAL | Status: DC | PRN
  Administered 2020-03-06 – 2020-03-08 (×5): 100 mg via ORAL
  Filled 2020-03-06 (×5): qty 1

## 2020-03-06 MED ORDER — METHYLPREDNISOLONE SODIUM SUCC 40 MG IJ SOLR
40.0000 mg | Freq: Every day | INTRAMUSCULAR | Status: DC
  Administered 2020-03-07 – 2020-03-08 (×2): 40 mg via INTRAVENOUS
  Filled 2020-03-06 (×2): qty 1

## 2020-03-06 MED ORDER — LACTATED RINGERS IV SOLN: INTRAVENOUS | Status: DC

## 2020-03-06 NOTE — Progress Notes (Signed)
PROGRESS NOTE  Richard Li  WFU:[id number] DOB: 01-18-1961 DOA: 03/05/2020 PCP: [name], DO Brief Narrative: Richard Li is a 59 y.o. male with a history of stage II right lung adenocarcinoma recently started on chemotherapy and radiation, COPD, HTN, HLD who presented to the ED with shortness of breath worsening over 2 weeks with productive cough, fever, weakness, and poor oral intake. Symptoms progressed despite taking levaquin as an outpatient. He was seen in his oncologist's office, found to be hypotensive and tachycardic, so sent to the ED and found to have extensive infiltrate across the right lung on CXR. He appeared dehydrated, tachypneic with low-normal oxygen saturations. Labs showed normal WBC with lymphopenia and AKI with hyponatremia. IV fluids were given, initially got ceftriaxone, azithromycin, this was switched to vancomycin, cefepime. upon admission for pneumonia that failed outpatient treatment.  Assessment & Plan: Principal Problem:   CAP (community acquired pneumonia) Active Problems:   GAD (generalized anxiety disorder)   Essential hypertension   SOB (shortness of breath)   COPD with acute exacerbation (HCC)   Generalized weakness   Acute hyponatremia   AKI (acute kidney injury) ([place])  Right-sided multilobar HCAP: SARS-CoV-2, influenza testing negative, though pt is lymphopenic with mild left shift. Pneumococcal Ag negative.  - Continue broadened antibiotic therapy including MRSA coverage in immunocompromised patient undergoing chemoradiation with +MRSA PCR. Initially got ceftriaxone, azithromycin, this was switched to vancomycin, cefepime.  - Monitor blood cultures, sputum culture - Antitussives  COPD: With exacerbation documented at admission/wheezing which has resolved.  - Continue steroids, decrease dose - Continue scheduled and prn BDs  Dehydration with AKI: Improved with IVF - Continue IVF given poor oral intake.   Stage IIB (T3, N0, M0)  adenocarcinoma of right lung: Dx Sept 2021.  - On chemotherapy (carboplatin, paclitaxel) initiated 10/18, last given 11/8 s/p 4 cycles, supervised by Dr. Julien Nordmann and undergoing radiation therapy, last was 11/10. Planning for surgery following this. I've added Dr. Julien Nordmann to the treatment team to notify of admission.  Weakness: Due to infection, dehydration, cancer and treatment.  - Continue PT/OT, treating conditions as above.   Hypomagnesemia:  - Supplement  Folic acid deficiency:  - Supplement  Anemia of chronic disease:  - Monitor for bleeding.   Hyponatremia: Resolved with volume resuscitation.  HTN:  - Losartan on hold with AKI  Anxiety:  - Continue prn   DVT prophylaxis: Lovenox Code Status: Full Family Communication: None at bedside Disposition Plan:  Status is: Inpatient  Remains inpatient appropriate because:Inpatient level of care appropriate due to severity of illness  Dispo: The patient is from: Home              Anticipated d/c is to: Home              Anticipated d/c date is: 2 days              Patient currently is not medically stable to d/c.  Consultants:   None, Oncology notified of admission.  Procedures:   None  Antimicrobials:  Levaquin PTA  Ceftriaxone, azithromycin 11/11   Vancomycin, cefepime 11/11 >>  Subjective: Feels weak and fatigued with nausea and mild dyspnea though improved from admission. No chest pain.   Objective: Vitals:   03/06/20 1400 03/06/20 1452 03/06/20 1500 03/06/20 1548  BP: 121/68 121/68 125/72 133/74  Pulse: (!) 104 (!) 102 (!) 102 (!) 106  Resp: (!) 27 (!) 28 (!) 30 (!) 24  Temp:      TempSrc:  Oral    SpO2: 95% 96% 94% 96%  Weight:      Height:        Intake/Output Summary (Last 24 hours) at 03/06/2020 1603 Last data filed at 03/05/2020 1819 Gross per 24 hour  Intake 349.83 ml  Output --  Net 349.83 ml   Filed Weights   03/06/20 1210  Weight: 88.9 kg    Gen: 59 y.o. male in no  distress Pulm: Non-labored tachypnea. Diminished with egophony and bronchophony on R CV: Regular tachycardia. No murmur, rub, or gallop. No JVD, no pitting pedal edema. GI: Abdomen soft, non-tender, non-distended, with normoactive bowel sounds. No organomegaly or masses felt. Ext: Warm, no deformities Skin: No rashes, lesions or ulcers Neuro: Alert and oriented. No focal neurological deficits. Psych: Judgement and insight appear normal. Mood & affect appropriate.   Data Reviewed: I have personally reviewed following labs and imaging studies  CBC: Recent Labs  Lab 03/02/20 0917 03/05/20 1708 03/06/20 0544  WBC 6.5 4.1 3.2*  NEUTROABS 4.8 3.5 2.7  HGB 10.9* 9.4* 9.4*  HCT 30.1* 26.7* 26.8*  MCV 84.3 88.7 87.3  PLT 216 139* 782*   Basic Metabolic Panel: Recent Labs  Lab 03/02/20 0917 03/05/20 1708 03/05/20 2002 03/06/20 0544  NA 133* 131*  --  135  K 3.3* 3.7  --  3.5  CL 101 95*  --  102  CO2 23 23  --  23  GLUCOSE 112* 109*  --  128*  BUN 20 26*  --  17  CREATININE 1.84* 1.41*  --  0.94  CALCIUM 8.7* 8.0*  --  8.0*  MG  --   --  1.6* 1.5*  PHOS  --   --   --  2.8   GFR: Estimated Creatinine Clearance: 88.8 mL/min (by C-G formula based on SCr of 0.94 mg/dL). Liver Function Tests: Recent Labs  Lab 03/02/20 0917 03/05/20 1708 03/06/20 0544  AST 20 22 20   ALT 13 18 17   ALKPHOS 67 55 51  BILITOT 0.8 2.4* 2.2*  PROT 6.4* 6.4* 5.5*  ALBUMIN 2.8* 2.8* 2.5*   No results for input(s): LIPASE, AMYLASE in the last 168 hours. No results for input(s): AMMONIA in the last 168 hours. Coagulation Profile: Recent Labs  Lab 03/05/20 1708 03/06/20 0544  INR 1.2 1.2   Cardiac Enzymes: No results for input(s): CKTOTAL, CKMB, CKMBINDEX, TROPONINI in the last 168 hours. BNP (last 3 results) No results for input(s): PROBNP in the last 8760 hours. HbA1C: No results for input(s): HGBA1C in the last 72 hours. CBG: No results for input(s): GLUCAP in the last 168  hours. Lipid Profile: No results for input(s): CHOL, HDL, LDLCALC, TRIG, CHOLHDL, LDLDIRECT in the last 72 hours. Thyroid Function Tests: Recent Labs    03/06/20 0544  TSH 0.615   Anemia Panel: Recent Labs    03/05/20 2002  FOLATE 5.2*  FERRITIN 1,074*  TIBC 186*  IRON 59  RETICCTPCT 0.5   Urine analysis:    Component Value Date/Time   COLORURINE AMBER (A) 03/05/2020 1820   APPEARANCEUR CLEAR 03/05/2020 1820   LABSPEC 1.011 03/05/2020 1820   PHURINE 5.0 03/05/2020 1820   GLUCOSEU NEGATIVE 03/05/2020 1820   GLUCOSEU NEGATIVE 12/12/2011 0944   HGBUR NEGATIVE 03/05/2020 1820   BILIRUBINUR NEGATIVE 03/05/2020 1820   KETONESUR NEGATIVE 03/05/2020 1820   PROTEINUR NEGATIVE 03/05/2020 1820   UROBILINOGEN 0.2 12/12/2011 0944   NITRITE NEGATIVE 03/05/2020 1820   LEUKOCYTESUR NEGATIVE 03/05/2020 1820   Recent Results (  from the past 240 hour(s))  Blood Culture (routine x 2)     Status: None (Preliminary result)   Collection Time: 03/05/20  5:08 PM   Specimen: BLOOD  Result Value Ref Range Status   Specimen Description   Final    BLOOD LEFT ANTECUBITAL Performed at [facility] [address] [place], [place] [address]    Special Requests   Final    BOTTLES DRAWN AEROBIC AND ANAEROBIC Blood Culture adequate volume Performed at [facility] [address] [place], [place] [address]    Culture   Final    NO GROWTH < 12 HOURS Performed at [facility] [address], [place], [place] [address]    Report Status PENDING  Incomplete  Blood Culture (routine x 2)     Status: None (Preliminary result)   Collection Time: 03/05/20  5:08 PM   Specimen: BLOOD RIGHT FOREARM  Result Value Ref Range Status   Specimen Description   Final    BLOOD RIGHT FOREARM Performed at [facility] [address] [place], [place] [address]    Special Requests   Final    BOTTLES DRAWN AEROBIC AND ANAEROBIC Blood Culture  adequate volume Performed at [facility] [address], [place], [place] [address]    Culture   Final    NO GROWTH < 12 HOURS Performed at [facility] [address] [place], [place] [address]    Report Status PENDING  Incomplete  Respiratory Panel by RT PCR (Flu A&B, Covid) - Nasopharyngeal Swab     Status: None   Collection Time: 03/05/20  7:32 PM   Specimen: Nasopharyngeal Swab  Result Value Ref Range Status   SARS Coronavirus 2 by RT PCR NEGATIVE NEGATIVE Final    Comment: (NOTE) SARS-CoV-2 target nucleic acids are NOT DETECTED.  The SARS-CoV-2 RNA is generally detectable in upper respiratoy specimens during the acute phase of infection. The lowest concentration of SARS-CoV-2 viral copies this assay can detect is 131 copies/mL. A negative result does not preclude SARS-Cov-2 infection and should not be used as the sole basis for treatment or other patient management decisions. A negative result may occur with  improper specimen collection/handling, submission of specimen other than nasopharyngeal swab, presence of viral mutation(s) within the areas targeted by this assay, and inadequate number of viral copies (<131 copies/mL). A negative result must be combined with clinical observations, patient history, and epidemiological information. The expected result is Negative.  Fact Sheet for Patients:  PinkCheek.be  Fact Sheet for Healthcare Providers:  GravelBags.it  This test is no t yet approved or cleared by the [place] FDA and  has been authorized for detection and/or diagnosis of SARS-CoV-2 by FDA under an Emergency Use Authorization (EUA). This EUA will remain  in effect (meaning this test can be used) for the duration of the COVID-19 declaration under Section 564(b)(1) of the Act, 21 U.S.C. section 360bbb-3(b)(1), unless the authorization is terminated or revoked sooner.      Influenza A by PCR NEGATIVE NEGATIVE Final   Influenza B by PCR NEGATIVE NEGATIVE Final    Comment: (NOTE) The Xpert Xpress SARS-CoV-2/FLU/RSV assay is intended as an aid in  the diagnosis of influenza from Nasopharyngeal swab specimens and  should not be used as a sole basis for treatment. Nasal washings and  aspirates are unacceptable for Xpert Xpress SARS-CoV-2/FLU/RSV  testing.  Fact Sheet for Patients: PinkCheek.be  Fact Sheet for  Healthcare Providers: GravelBags.it  This test is not yet approved or cleared by the [place] and  has been authorized for detection and/or diagnosis of SARS-CoV-2 by  FDA under an Emergency Use Authorization (EUA). This EUA will remain  in effect (meaning this test can be used) for the duration of the  Covid-19 declaration under Section 564(b)(1) of the Act, 21  U.S.C. section 360bbb-3(b)(1), unless the authorization is  terminated or revoked. Performed at [facility], [place] [address] [place], [place] [address]   MRSA PCR Screening     Status: Abnormal   Collection Time: 03/06/20  6:51 AM   Specimen: Nasopharyngeal  Result Value Ref Range Status   MRSA by PCR POSITIVE (A) NEGATIVE Final    Comment:        The GeneXpert MRSA Assay (FDA approved for NASAL specimens only), is one component of a comprehensive MRSA colonization surveillance program. It is not intended to diagnose MRSA infection nor to guide or monitor treatment for MRSA infections. RESULT CALLED TO, READ BACK BY AND VERIFIED WITH: A,BANO AT 1000 ON 03/06/20 BY A,MOHAMED Performed at [facility], [place] [address] [place], [place] [address]   Expectorated sputum assessment w rflx to resp cult     Status: None   Collection Time: 03/06/20  6:58 AM   Specimen: Expectorated Sputum  Result Value Ref Range Status   Specimen Description EXPECTORATED SPUTUM  Final   Special  Requests NONE  Final   Sputum evaluation   Final    THIS SPECIMEN IS ACCEPTABLE FOR SPUTUM CULTURE Performed at [facility], [place] [address], [place], [place] [address]    Report Status 03/06/2020 FINAL  Final  Culture, respiratory     Status: None (Preliminary result)   Collection Time: 03/06/20  6:58 AM  Result Value Ref Range Status   Specimen Description   Final    EXPECTORATED SPUTUM Performed at [facility] [address], [place], [place] [address]    Special Requests   Final    NONE Reflexed from [id number] Performed at [facility], [place] [address], [place], [place] [address]    Gram Stain   Final    NO WBC SEEN RARE GRAM POSITIVE COCCI IN PAIRS RARE GRAM VARIABLE ROD Performed at [facility], [place] [address] [place], [place] [address]    Culture PENDING  Incomplete   Report Status PENDING  Incomplete      Radiology Studies: DG Chest 1 View  Result Date: 03/05/2020 CLINICAL DATA:  History of lung cancer. Shortness of breath, fever and weakness. EXAM: CHEST  1 VIEW COMPARISON:  01/07/2020 FINDINGS: Heart size is normal. Left lung is clear. Pulmonary infiltrate affecting the right lower lobe and upper lobe consistent with bronchopneumonia. Cannot rule out an element of radiation affect or interstitial spread of tumor, but due to the extensive nature of the findings, infectious pneumonia seems likely. Small amount of pleural fluid on the right. IMPRESSION: Extensive infiltrate in the right upper and lower lobes consistent with infectious pneumonia. Small amount of pleural fluid on the right. Electronically Signed   By: Nelson Chimes M.D.   On: 03/05/2020 11:44    Scheduled Meds: . enoxaparin (LOVENOX) injection  40 mg Subcutaneous Q24H  . fenofibrate  160 mg Oral Q breakfast  . folic acid  1 mg Oral Daily  . Ipratropium-Albuterol  1 puff Inhalation Q6H WA  . methylPREDNISolone (SOLU-MEDROL) injection  40  mg  Intravenous Q6H   Continuous Infusions: . ceFEPime (MAXIPIME) IV 2 g (03/06/20 1418)  . vancomycin 1,750 mg (03/06/20 1124)     LOS: 1 day   Time spent: 35 minutes.  Patrecia Pour, MD [facility] [email or web address] 03/06/2020, 4:03 PM

## 2020-03-06 NOTE — ED Notes (Signed)
Report given to Elnita Maxwell., RN.  SBAR information covered with accepting nurse.  Npo additional question asked at this time.  PT resting quietly in bed.  NADN.

## 2020-03-06 NOTE — ED Notes (Signed)
Dr Bonner Puna at bedside. Still awaiting bed assignment for admission.

## 2020-03-06 NOTE — ED Notes (Signed)
PT at bedside for evaluation.

## 2020-03-06 NOTE — Evaluation (Signed)
Physical Therapy Evaluation Only Patient Details Name: Richard Li MRN: [id number] DOB: 08-20-1960 Today's Date: 03/06/2020   History of Present Illness  59 y.o. male with PMH of stage II adenocarcinoma of the right lung on chemotherapy/radiation, HTN, COPD with c/o SOB; in ED found to have community acquired pneumonia.  Clinical Impression  Pt easily fatigues since starting new chemo over the past 3 weeks, but is independent with ADLs and ambulation without AD and denies falls. Pt is able to complete STS reps from EOB without hands assisting and ambulate in room without AD, only limtied due to lines. Pt without SOB noted when mobilizing, SpO2 >90% on RA and HR 115 max noted with mobility. Educated pt on frequent ambulation, even if short distance to avoid lying supine for prolonged periods unless sleeping and pt verbalized understanding. No acute PT indicated.    Follow Up Recommendations No PT follow up    Equipment Recommendations  None recommended by PT    Recommendations for Other Services       Precautions / Restrictions Precautions Precautions: None Restrictions Weight Bearing Restrictions: No      Mobility  Bed Mobility Overal bed mobility: Independent   Transfers Overall transfer level: Modified independent Equipment used: None  General transfer comment: performs STS reps from EOB without UE assisting, good steadiness, no SOB noted  Ambulation/Gait Ambulation/Gait assistance: Modified independent (Device/Increase time) Gait Distance (Feet): 20 Feet Assistive device: None Gait Pattern/deviations: WFL(Within Functional Limits)    General Gait Details: pt up ambulating to table upon entry into room; pt ambulates around room without AD, slightly decreased cadence with good bil foot clearance, denies SOB and none noted, limited due to lines  Stairs            Wheelchair Mobility    Modified Rankin (Stroke Patients Only)       Balance Overall balance  assessment: No apparent balance deficits (not formally assessed)          Pertinent Vitals/Pain Pain Assessment: No/denies pain    Home Living Family/patient expects to be discharged to:: Private residence Living Arrangements: Spouse/significant other Available Help at Discharge: Family;Available PRN/intermittently Type of Home: House Home Access: Stairs to enter Entrance Stairs-Rails: Can reach both Entrance Stairs-Number of Steps: 5 Home Layout: One level Home Equipment: None      Prior Function Level of Independence: Independent         Comments: Pt independent with ADLs, 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     Hand Dominance        Extremity/Trunk Assessment   Upper Extremity Assessment Upper Extremity Assessment: Overall WFL for tasks assessed    Lower Extremity Assessment Lower Extremity Assessment: Overall WFL for tasks assessed (AROM WNL and strength 4+/5 throughout, reports numbness in bil feet)    Cervical / Trunk Assessment Cervical / Trunk Assessment: Normal  Communication   Communication: No difficulties  Cognition Arousal/Alertness: Awake/alert Behavior During Therapy: WFL for tasks assessed/performed Overall Cognitive Status: Within Functional Limits for tasks assessed     General Comments      Exercises     Assessment/Plan    PT Assessment Patent does not need any further PT services  PT Problem List         PT Treatment Interventions      PT Goals (Current goals can be found in the Care Plan section)  Acute Rehab PT Goals Patient Stated Goal: return home PT Goal Formulation: With patient/family Time For Goal Achievement: 03/06/20 Potential  to Achieve Goals: Good    Frequency     Barriers to discharge        Co-evaluation               AM-PAC PT "6 Clicks" Mobility  Outcome Measure Help needed turning from your back to your side while in a flat bed without using bedrails?: None Help needed moving from  lying on your back to sitting on the side of a flat bed without using bedrails?: None Help needed moving to and from a bed to a chair (including a wheelchair)?: None Help needed standing up from a chair using your arms (e.g., wheelchair or bedside chair)?: None Help needed to walk in hospital room?: None Help needed climbing 3-5 steps with a railing? : None 6 Click Score: 24    End of Session   Activity Tolerance: Patient tolerated treatment well Patient left: in bed;with call bell/phone within reach;with family/visitor present Nurse Communication: Mobility status PT Visit Diagnosis: Other abnormalities of gait and mobility (R26.89)    Time: 6948-5462 PT Time Calculation (min) (ACUTE ONLY): 13 min   Charges:   PT Evaluation $PT Eval Low Complexity: 1 Low           Tori Noelani Harbach PT, DPT 03/06/20, 12:30 PM

## 2020-03-07 DIAGNOSIS — J441 Chronic obstructive pulmonary disease with (acute) exacerbation: Secondary | ICD-10-CM | POA: Diagnosis not present

## 2020-03-07 DIAGNOSIS — E871 Hypo-osmolality and hyponatremia: Secondary | ICD-10-CM | POA: Diagnosis not present

## 2020-03-07 DIAGNOSIS — N179 Acute kidney failure, unspecified: Secondary | ICD-10-CM | POA: Diagnosis not present

## 2020-03-07 DIAGNOSIS — J189 Pneumonia, unspecified organism: Secondary | ICD-10-CM | POA: Diagnosis not present

## 2020-03-07 LAB — BASIC METABOLIC PANEL
Anion gap: 10 (ref 5–15)
BUN: 23 mg/dL — ABNORMAL HIGH (ref 6–20)
CO2: 24 mmol/L (ref 22–32)
Calcium: 8.3 mg/dL — ABNORMAL LOW (ref 8.9–10.3)
Chloride: 101 mmol/L (ref 98–111)
Creatinine, Ser: 0.8 mg/dL (ref 0.61–1.24)
GFR, Estimated: >60 mL/min (ref >60)
Glucose, Bld: 139 mg/dL — ABNORMAL HIGH (ref 70–99)
Potassium: 3 mmol/L — ABNORMAL LOW (ref 3.5–5.1)
Sodium: 135 mmol/L (ref 135–145)

## 2020-03-07 LAB — CBC WITH DIFFERENTIAL/PLATELET
Abs Immature Granulocytes: 0.19 10*3/uL — ABNORMAL HIGH (ref 0.00–0.07)
Basophils Absolute: 0 10*3/uL (ref 0.0–0.1)
Basophils Relative: 1 %
Eosinophils Absolute: 0 10*3/uL (ref 0.0–0.5)
Eosinophils Relative: 0 %
HCT: 25.3 % — ABNORMAL LOW (ref 39.0–52.0)
Hemoglobin: 8.9 g/dL — ABNORMAL LOW (ref 13.0–17.0)
Immature Granulocytes: 6 %
Lymphocytes Relative: 4 %
Lymphs Abs: 0.1 10*3/uL — ABNORMAL LOW (ref 0.7–4.0)
MCH: 30.7 pg (ref 26.0–34.0)
MCHC: 35.2 g/dL (ref 30.0–36.0)
MCV: 87.2 fL (ref 80.0–100.0)
Monocytes Absolute: 0.1 10*3/uL (ref 0.1–1.0)
Monocytes Relative: 4 %
Neutro Abs: 2.7 10*3/uL (ref 1.7–7.7)
Neutrophils Relative %: 85 %
Platelets: 126 10*3/uL — ABNORMAL LOW (ref 150–400)
RBC: 2.9 MIL/uL — ABNORMAL LOW (ref 4.22–5.81)
RDW: 13.1 % (ref 11.5–15.5)
WBC: 3.2 10*3/uL — ABNORMAL LOW (ref 4.0–10.5)
nRBC: 0 % (ref 0.0–0.2)

## 2020-03-07 MED ORDER — MAGNESIUM SULFATE 2 GM/50ML IV SOLN
2.0000 g | Freq: Once | INTRAVENOUS | Status: AC
  Administered 2020-03-07: 2 g via INTRAVENOUS
  Filled 2020-03-07: qty 50

## 2020-03-07 MED ORDER — GUAIFENESIN 200 MG PO TABS
400.0000 mg | ORAL_TABLET | Freq: Once | ORAL | Status: AC
  Administered 2020-03-07: 400 mg via ORAL
  Filled 2020-03-07: qty 2

## 2020-03-07 MED ORDER — VANCOMYCIN HCL 1250 MG/250ML IV SOLN
1250.0000 mg | Freq: Two times a day (BID) | INTRAVENOUS | Status: DC
  Administered 2020-03-07: 1250 mg via INTRAVENOUS
  Filled 2020-03-07 (×2): qty 250

## 2020-03-07 MED ORDER — POTASSIUM CHLORIDE CRYS ER 20 MEQ PO TBCR
30.0000 meq | EXTENDED_RELEASE_TABLET | Freq: Two times a day (BID) | ORAL | Status: AC
  Administered 2020-03-07 (×2): 30 meq via ORAL
  Filled 2020-03-07 (×2): qty 1

## 2020-03-07 NOTE — Progress Notes (Signed)
Pharmacy Antibiotic Note  Richard Li is a 59 y.o. male currently undergoing chemo for lung cancer, admitted on 03/05/2020 with pneumonia.  Pharmacy has been consulted for vancomycin and cefepime dosing.  Pt CrCl has improved from admission as SCr has improved from 1.41 to 0.94.  Current plan is to continue vancomycin as pt has failed levaquin, has +MRSA PCR, is healthcare-exposed and immunosuppressed with multilobar pneumonia, as well as there's high suspicion for MRSA. S. aureus growing in sputum with susceptibilities pending.    Plan:  Increase vancomycin 1250 mg IV q12 hr  Measure vancomycin level at steady state as indicated  SCr q48 while on vanc  Cefepime 2 g IV q8 hr   Height: 5' 10.5" (179.1 cm) Weight: 88.9 kg (196 lb) IBW/kg (Calculated) : 74.15  Temp (24hrs), Avg:98.3 F (36.8 C), Min:97.8 F (36.6 C), Max:98.7 F (37.1 C)  Recent Labs  Lab 03/02/20 0917 03/05/20 1708 03/06/20 0544 03/07/20 0613  WBC 6.5 4.1 3.2* 3.2*  CREATININE 1.84* 1.41* 0.94 0.80  LATICACIDVEN  --  1.1  --   --     Estimated Creatinine Clearance: 104.3 mL/min (by C-G formula based on SCr of 0.8 mg/dL).    No Known Allergies  Antimicrobials this admission: 11/11 vancomycin >>  11/11 cefepime >>  11/11 azithromycin >> 11/12  Dose adjustments this admission: n/a  Microbiology results: 11/11 BCx: NGTD  11/11 UCx: 7000 colonies of E. Faecalis   11/12 MRSA PCR: positive  11/1 Resp Panel: COVID: neg, FluA/B: neg 11/12 resp culture: few Staph aureus   Thank you for allowing pharmacy to be a part of this patient's care.  Royetta Asal, PharmD, BCPS 03/07/2020 3:42 PM

## 2020-03-07 NOTE — Progress Notes (Signed)
Patient having trouble sleeping d/t coughing. Too early to give PRN cough meds. Paged Richard Li. New order for mucinex ordered and given. Will continue to monitor.

## 2020-03-07 NOTE — Progress Notes (Signed)
PROGRESS NOTE  Richard Li  IRJ:[id number] DOB: 10/16/1960 DOA: 03/05/2020 PCP: [name], DO Brief Narrative: Richard Li is a 59 y.o. male with a history of stage II right lung adenocarcinoma recently started on chemotherapy and radiation, COPD, HTN, HLD who presented to the ED with shortness of breath worsening over 2 weeks with productive cough, fever, weakness, and poor oral intake. Symptoms progressed despite taking levaquin as an outpatient. He was seen in his oncologist's office, found to be hypotensive and tachycardic, so sent to the ED and found to have extensive infiltrate across the right lung on CXR. He appeared dehydrated, tachypneic with low-normal oxygen saturations. Labs showed normal WBC with lymphopenia and AKI with hyponatremia. IV fluids were given, initially got ceftriaxone, azithromycin, this was switched to vancomycin, cefepime. upon admission for pneumonia that failed outpatient treatment.  Assessment & Plan: Principal Problem:   CAP (community acquired pneumonia) Active Problems:   GAD (generalized anxiety disorder)   Essential hypertension   SOB (shortness of breath)   COPD with acute exacerbation (HCC)   Generalized weakness   Acute hyponatremia   AKI (acute kidney injury) ([place])  Right-sided multilobar HCAP: SARS-CoV-2, influenza testing negative, though pt is lymphopenic with mild left shift. Pneumococcal Ag negative.  - Since he failed levaquin, has +MRSA PCR, is healthcare-exposed and immunosuppressed with multilobar pneumonia, there's high suspicion for MRSA. S. aureus growing in sputum with susceptibilities pending. Will continue broad coverage including vancomycin for  - Monitor blood cultures (NG2D), sputum culture (Staph aureus, NOS) - Antitussives, continue  COPD: With exacerbation documented at admission/wheezing which has resolved.  - Continue steroids, decreased dose, plan 5 days Tx - Continue scheduled and prn BDs  Dehydration with  AKI: Improved with IVF - DC IVF with improved po intake.  Stage IIB (T3, N0, M0) adenocarcinoma of right lung: Dx Sept 2021.  - On chemotherapy (carboplatin, paclitaxel) initiated 10/18, last given 11/8 s/p 4 cycles, supervised by Dr. Julien Nordmann and undergoing radiation therapy, last was 11/10. Planning for surgery following this. I've added Dr. Julien Nordmann to the treatment team to notify of admission.  Weakness: Due to infection, dehydration, cancer and treatment.  - Continue PT/OT, treating conditions as above. Improving.  Hypomagnesemia:  - Supplement again today  Hypokalemia:  - Supplement and monitor.  Folic acid deficiency:  - Supplement  Anemia of chronic disease:  - Monitor for bleeding.   Hyponatremia: Resolved with volume resuscitation.  HTN:  - Losartan on hold with AKI  Anxiety:  - Continue prn   DVT prophylaxis: Lovenox Code Status: Full Family Communication: None at bedside Disposition Plan:  Status is: Inpatient  Remains inpatient appropriate because:Inpatient level of care appropriate due to severity of illness  Dispo: The patient is from: Home              Anticipated d/c is to: Home              Anticipated d/c date is: 1 day              Patient currently is not medically stable to d/c.  Consultants:   None, Oncology notified of admission.  Procedures:   None  Antimicrobials:  Levaquin PTA  Ceftriaxone, azithromycin 11/11   Vancomycin, cefepime 11/11 >>  Subjective: Feels much better starting this afternoon. Wife at bedside notes improved strength and po intake today. Shortness of breath improved, putting effort toward moving around more as well.   Objective: Vitals:   03/07/20 0121 03/07/20 0534 03/07/20  1001 03/07/20 1425  BP: 128/79 108/73 108/60 112/61  Pulse: 87 89 100 99  Resp: 18 18 17 20   Temp: 98.7 F (37.1 C) 97.8 F (36.6 C) 98.1 F (36.7 C) 98 F (36.7 C)  TempSrc: Oral Oral Oral Oral  SpO2: 97% 95% 96% 96%  Weight:       Height:        Intake/Output Summary (Last 24 hours) at 03/07/2020 1434 Last data filed at 03/07/2020 0915 Gross per 24 hour  Intake 1770 ml  Output 1100 ml  Net 670 ml   Filed Weights   03/06/20 1210  Weight: 88.9 kg   Gen: 59 y.o. male in no distress Pulm: Nonlabored, tachypneic with exertion, dull to percussion on right with bronchophony on right. No wheezes. CV: Regular rate and rhythm. No murmur, rub, or gallop. No JVD, no dependent edema. GI: Abdomen soft, non-tender, non-distended, with normoactive bowel sounds.  Ext: Warm, no deformities Skin: No rashes, lesions or ulcers on visualized skin. Neuro: Alert and oriented. No focal neurological deficits. Psych: Judgement and insight appear fair. Mood euthymic & affect congruent. Behavior is appropriate.    Data Reviewed: I have personally reviewed following labs and imaging studies  CBC: Recent Labs  Lab 03/02/20 0917 03/05/20 1708 03/06/20 0544 03/07/20 0613  WBC 6.5 4.1 3.2* 3.2*  NEUTROABS 4.8 3.5 2.7 2.7  HGB 10.9* 9.4* 9.4* 8.9*  HCT 30.1* 26.7* 26.8* 25.3*  MCV 84.3 88.7 87.3 87.2  PLT 216 139* 120* 440*   Basic Metabolic Panel: Recent Labs  Lab 03/02/20 0917 03/05/20 1708 03/05/20 2002 03/06/20 0544 03/07/20 0613  NA 133* 131*  --  135 135  K 3.3* 3.7  --  3.5 3.0*  CL 101 95*  --  102 101  CO2 23 23  --  23 24  GLUCOSE 112* 109*  --  128* 139*  BUN 20 26*  --  17 23*  CREATININE 1.84* 1.41*  --  0.94 0.80  CALCIUM 8.7* 8.0*  --  8.0* 8.3*  MG  --   --  1.6* 1.5*  --   PHOS  --   --   --  2.8  --    GFR: Estimated Creatinine Clearance: 104.3 mL/min (by C-G formula based on SCr of 0.8 mg/dL). Liver Function Tests: Recent Labs  Lab 03/02/20 0917 03/05/20 1708 03/06/20 0544  AST 20 22 20   ALT 13 18 17   ALKPHOS 67 55 51  BILITOT 0.8 2.4* 2.2*  PROT 6.4* 6.4* 5.5*  ALBUMIN 2.8* 2.8* 2.5*   No results for input(s): LIPASE, AMYLASE in the last 168 hours. No results for input(s): AMMONIA  in the last 168 hours. Coagulation Profile: Recent Labs  Lab 03/05/20 1708 03/06/20 0544  INR 1.2 1.2   Cardiac Enzymes: No results for input(s): CKTOTAL, CKMB, CKMBINDEX, TROPONINI in the last 168 hours. BNP (last 3 results) No results for input(s): PROBNP in the last 8760 hours. HbA1C: No results for input(s): HGBA1C in the last 72 hours. CBG: No results for input(s): GLUCAP in the last 168 hours. Lipid Profile: No results for input(s): CHOL, HDL, LDLCALC, TRIG, CHOLHDL, LDLDIRECT in the last 72 hours. Thyroid Function Tests: Recent Labs    03/06/20 0544  TSH 0.615   Anemia Panel: Recent Labs    03/05/20 2002  FOLATE 5.2*  FERRITIN 1,074*  TIBC 186*  IRON 59  RETICCTPCT 0.5   Urine analysis:    Component Value Date/Time   COLORURINE AMBER (A) 03/05/2020  [place] 03/05/2020 1820   LABSPEC 1.011 03/05/2020 1820   PHURINE 5.0 03/05/2020 1820   GLUCOSEU NEGATIVE 03/05/2020 1820   GLUCOSEU NEGATIVE 12/12/2011 0944   HGBUR NEGATIVE 03/05/2020 1820   BILIRUBINUR NEGATIVE 03/05/2020 1820   KETONESUR NEGATIVE 03/05/2020 1820   PROTEINUR NEGATIVE 03/05/2020 1820   UROBILINOGEN 0.2 12/12/2011 0944   NITRITE NEGATIVE 03/05/2020 1820   LEUKOCYTESUR NEGATIVE 03/05/2020 1820   Recent Results (from the past 240 hour(s))  Blood Culture (routine x 2)     Status: None (Preliminary result)   Collection Time: 03/05/20  5:08 PM   Specimen: BLOOD  Result Value Ref Range Status   Specimen Description   Final    BLOOD LEFT ANTECUBITAL Performed at [facility], [place] [address], [place], [place] [address]    Special Requests   Final    BOTTLES DRAWN AEROBIC AND ANAEROBIC Blood Culture adequate volume Performed at [place] [address], [place], [place] [address]    Culture   Final    NO GROWTH 2 DAYS Performed at [place] [address] [place], [place] [address]    Report Status PENDING  Incomplete   Blood Culture (routine x 2)     Status: None (Preliminary result)   Collection Time: 03/05/20  5:08 PM   Specimen: BLOOD RIGHT FOREARM  Result Value Ref Range Status   Specimen Description   Final    BLOOD RIGHT FOREARM Performed at [place] [address], [place], [place] [address]    Special Requests   Final    BOTTLES DRAWN AEROBIC AND ANAEROBIC Blood Culture adequate volume Performed at [place] [address] [place], [place] [address]    Culture   Final    NO GROWTH 2 DAYS Performed at [place] [address] [place], [place] [address]    Report Status PENDING  Incomplete  Urine culture     Status: Abnormal (Preliminary result)   Collection Time: 03/05/20  6:20 PM   Specimen: In/Out Cath Urine  Result Value Ref Range Status   Specimen Description   Final    IN/OUT CATH URINE Performed at [place] [address], [place], [place] [address]    Special Requests   Final    NONE Performed at [facility], [place] [address] [place], [place] [address]    Culture (A)  Final    7,000 COLONIES/mL ENTEROCOCCUS FAECALIS SUSCEPTIBILITIES TO FOLLOW Performed at [facility], [place] [address], [place],  [address]    Report Status PENDING  Incomplete  Respiratory Panel by RT PCR (Flu A&B, Covid) - Nasopharyngeal Swab     Status: None   Collection Time: 03/05/20  7:32 PM   Specimen: Nasopharyngeal Swab  Result Value Ref Range Status   SARS Coronavirus 2 by RT PCR NEGATIVE NEGATIVE Final    Comment: (NOTE) SARS-CoV-2 target nucleic acids are NOT DETECTED.  The SARS-CoV-2 RNA is generally detectable in upper respiratoy specimens during the acute phase of infection. The lowest concentration of SARS-CoV-2 viral copies this assay can detect is 131 copies/mL. A negative result does not preclude SARS-Cov-2 infection and should not be used as the sole basis for treatment  or other patient management decisions. A negative result may occur with  improper specimen collection/handling, submission of specimen other than nasopharyngeal swab, presence of viral mutation(s) within the areas targeted by this  assay, and inadequate number of viral copies (<131 copies/mL). A negative result must be combined with clinical observations, patient history, and epidemiological information. The expected result is Negative.  Fact Sheet for Patients:  PinkCheek.be  Fact Sheet for Healthcare Providers:  GravelBags.it  This test is no t yet approved or cleared by the [place] FDA and  has been authorized for detection and/or diagnosis of SARS-CoV-2 by FDA under an Emergency Use Authorization (EUA). This EUA will remain  in effect (meaning this test can be used) for the duration of the COVID-19 declaration under Section 564(b)(1) of the Act, 21 U.S.C. section 360bbb-3(b)(1), unless the authorization is terminated or revoked sooner.     Influenza A by PCR NEGATIVE NEGATIVE Final   Influenza B by PCR NEGATIVE NEGATIVE Final    Comment: (NOTE) The Xpert Xpress SARS-CoV-2/FLU/RSV assay is intended as an aid in  the diagnosis of influenza from Nasopharyngeal swab specimens and  should not be used as a sole basis for treatment. Nasal washings and  aspirates are unacceptable for Xpert Xpress SARS-CoV-2/FLU/RSV  testing.  Fact Sheet for Patients: PinkCheek.be  Fact Sheet for Healthcare Providers: GravelBags.it  This test is not yet approved or cleared by the [place] FDA and  has been authorized for detection and/or diagnosis of SARS-CoV-2 by  FDA under an Emergency Use Authorization (EUA). This EUA will remain  in effect (meaning this test can be used) for the duration of the  Covid-19 declaration under Section 564(b)(1) of the Act, 21  U.S.C.  section 360bbb-3(b)(1), unless the authorization is  terminated or revoked. Performed at [facility], [place] [address], [place], [place] [address]   MRSA PCR Screening     Status: Abnormal   Collection Time: 03/06/20  6:51 AM   Specimen: Nasopharyngeal  Result Value Ref Range Status   MRSA by PCR POSITIVE (A) NEGATIVE Final    Comment:        The GeneXpert MRSA Assay (FDA approved for NASAL specimens only), is one component of a comprehensive MRSA colonization surveillance program. It is not intended to diagnose MRSA infection nor to guide or monitor treatment for MRSA infections. RESULT CALLED TO, READ BACK BY AND VERIFIED WITH: A,BANO AT 1000 ON 03/06/20 BY A,MOHAMED Performed at [facility], [place] [address] [place], [place] [address]   Expectorated sputum assessment w rflx to resp cult     Status: None   Collection Time: 03/06/20  6:58 AM   Specimen: Expectorated Sputum  Result Value Ref Range Status   Specimen Description EXPECTORATED SPUTUM  Final   Special Requests NONE  Final   Sputum evaluation   Final    THIS SPECIMEN IS ACCEPTABLE FOR SPUTUM CULTURE Performed at [facility], [place] [address] [place], [place] [address]    Report Status 03/06/2020 FINAL  Final  Culture, respiratory     Status: None (Preliminary result)   Collection Time: 03/06/20  6:58 AM  Result Value Ref Range Status   Specimen Description   Final    EXPECTORATED SPUTUM Performed at [place] [address], [place], [place] [address]    Special Requests   Final    NONE Reflexed from [id number] Performed at [facility], [place] [address], [place], [place] [address]    Gram Stain   Final    NO WBC SEEN RARE GRAM POSITIVE COCCI IN PAIRS RARE GRAM VARIABLE ROD    Culture   Final  FEW STAPHYLOCOCCUS AUREUS SUSCEPTIBILITIES TO FOLLOW Performed at [facility], [place] [address],  [place], [place] [address]    Report Status PENDING  Incomplete      Radiology Studies: No results found.  Scheduled Meds: . Chlorhexidine Gluconate Cloth  6 each Topical Q0600  . enoxaparin (LOVENOX) injection  40 mg Subcutaneous Q24H  . fenofibrate  160 mg Oral Q breakfast  . folic acid  1 mg Oral Daily  . Ipratropium-Albuterol  1 puff Inhalation Q6H WA  . methylPREDNISolone (SOLU-MEDROL) injection  40 mg Intravenous Daily  . mupirocin ointment  1 application Nasal BID  . potassium chloride  30 mEq Oral BID   Continuous Infusions: . ceFEPime (MAXIPIME) IV 2 g (03/07/20 0657)  . vancomycin 1,750 mg (03/07/20 1124)     LOS: 2 days   Time spent: 25 minutes.  Patrecia Pour, MD [facility] [email or web address] 03/07/2020, 2:34 PM

## 2020-03-08 ENCOUNTER — Encounter: Payer: Self-pay | Admitting: Internal Medicine

## 2020-03-08 DIAGNOSIS — E871 Hypo-osmolality and hyponatremia: Secondary | ICD-10-CM | POA: Diagnosis not present

## 2020-03-08 DIAGNOSIS — J189 Pneumonia, unspecified organism: Secondary | ICD-10-CM | POA: Diagnosis not present

## 2020-03-08 DIAGNOSIS — J441 Chronic obstructive pulmonary disease with (acute) exacerbation: Secondary | ICD-10-CM | POA: Diagnosis not present

## 2020-03-08 DIAGNOSIS — N179 Acute kidney failure, unspecified: Secondary | ICD-10-CM | POA: Diagnosis not present

## 2020-03-08 LAB — CULTURE, RESPIRATORY W GRAM STAIN: Gram Stain: NONE SEEN

## 2020-03-08 LAB — LEGIONELLA PNEUMOPHILA SEROGP 1 UR AG: L. pneumophila Serogp 1 Ur Ag: NEGATIVE

## 2020-03-08 LAB — CBC WITH DIFFERENTIAL/PLATELET
Abs Immature Granulocytes: 0.06 10*3/uL (ref 0.00–0.07)
Basophils Absolute: 0 10*3/uL (ref 0.0–0.1)
Basophils Relative: 1 %
Eosinophils Absolute: 0 10*3/uL (ref 0.0–0.5)
Eosinophils Relative: 0 %
HCT: 27.2 % — ABNORMAL LOW (ref 39.0–52.0)
Hemoglobin: 9.5 g/dL — ABNORMAL LOW (ref 13.0–17.0)
Immature Granulocytes: 2 %
Lymphocytes Relative: 11 %
Lymphs Abs: 0.4 10*3/uL — ABNORMAL LOW (ref 0.7–4.0)
MCH: 30.5 pg (ref 26.0–34.0)
MCHC: 34.9 g/dL (ref 30.0–36.0)
MCV: 87.5 fL (ref 80.0–100.0)
Monocytes Absolute: 0.3 10*3/uL (ref 0.1–1.0)
Monocytes Relative: 8 %
Neutro Abs: 2.7 10*3/uL (ref 1.7–7.7)
Neutrophils Relative %: 78 %
Platelets: 131 10*3/uL — ABNORMAL LOW (ref 150–400)
RBC: 3.11 MIL/uL — ABNORMAL LOW (ref 4.22–5.81)
RDW: 13.2 % (ref 11.5–15.5)
WBC: 3.5 10*3/uL — ABNORMAL LOW (ref 4.0–10.5)
nRBC: 0 % (ref 0.0–0.2)

## 2020-03-08 LAB — URINE CULTURE: Culture: 7000 — AB

## 2020-03-08 LAB — BASIC METABOLIC PANEL
Anion gap: 8 (ref 5–15)
BUN: 19 mg/dL (ref 6–20)
CO2: 27 mmol/L (ref 22–32)
Calcium: 8.2 mg/dL — ABNORMAL LOW (ref 8.9–10.3)
Chloride: 101 mmol/L (ref 98–111)
Creatinine, Ser: 0.68 mg/dL (ref 0.61–1.24)
GFR, Estimated: >60 mL/min (ref >60)
Glucose, Bld: 94 mg/dL (ref 70–99)
Potassium: 3.3 mmol/L — ABNORMAL LOW (ref 3.5–5.1)
Sodium: 136 mmol/L (ref 135–145)

## 2020-03-08 LAB — MAGNESIUM: Magnesium: 1.7 mg/dL (ref 1.7–2.4)

## 2020-03-08 MED ORDER — POTASSIUM CHLORIDE CRYS ER 20 MEQ PO TBCR
20.0000 meq | EXTENDED_RELEASE_TABLET | Freq: Once | ORAL | Status: AC
  Administered 2020-03-08: 20 meq via ORAL
  Filled 2020-03-08: qty 1

## 2020-03-08 MED ORDER — DOXYCYCLINE HYCLATE 100 MG PO TABS
100.0000 mg | ORAL_TABLET | Freq: Two times a day (BID) | ORAL | 0 refills | Status: DC
Start: 2020-03-08 — End: 2020-03-17

## 2020-03-08 MED ORDER — FOLIC ACID 1 MG PO TABS
1.0000 mg | ORAL_TABLET | Freq: Every day | ORAL | 2 refills | Status: DC
Start: 2020-03-08 — End: 2020-09-14

## 2020-03-08 MED ORDER — DOXYCYCLINE HYCLATE 100 MG PO TABS
100.0000 mg | ORAL_TABLET | Freq: Two times a day (BID) | ORAL | Status: DC
  Administered 2020-03-08: 100 mg via ORAL
  Filled 2020-03-08: qty 1

## 2020-03-08 NOTE — Discharge Summary (Signed)
Physician Discharge Summary  [name] SAY:[id number] DOB: [name] 24, 1960 DOA: 03/05/2020  PCP: Richard Nutting, DO  Admit date: 03/05/2020 Discharge date: 03/08/2020  Admitted From: Home Disposition: Home   Recommendations for Outpatient Follow-up:  1. Follow up with oncology 11/15.  2. Folic acid supplementation started.  Home Health: None Equipment/Devices: None Discharge Condition: Stable CODE STATUS: Full Diet recommendation: Regular  Brief/Interim Summary: Richard Li is a 59 y.o. male with a history of stage II right lung adenocarcinoma recently started on chemotherapy and radiation, COPD, HTN, HLD who presented to the ED 11/11 with shortness of breath worsening over 2 weeks with productive cough, fever, weakness, and poor oral intake. Symptoms progressed despite taking levaquin as an outpatient. He was seen in his oncologist's office, found to be hypotensive and tachycardic, so sent to the ED and found to have extensive infiltrate across the right lung on CXR. He appeared dehydrated, tachypneic with low-normal oxygen saturations. Labs showed normal WBC with lymphopenia and AKI with hyponatremia. IV fluids were given, initially got ceftriaxone, azithromycin, this was switched to vancomycin, cefepime. upon admission for pneumonia that failed outpatient treatment. Sputum culture grew some MRSA sensitive to doxycycline which is tolerated prior to discharge and prescribed.  Discharge Diagnoses:  Principal Problem:   CAP (community acquired pneumonia) Active Problems:   GAD (generalized anxiety disorder)   Essential hypertension   SOB (shortness of breath)   COPD with acute exacerbation (HCC)   Generalized weakness   Acute hyponatremia   AKI (acute kidney injury) ([place])  Right-sided multilobar HCAP: SARS-CoV-2, influenza testing negative, though pt is lymphopenic with mild left shift. Pneumococcal Ag negative.  - Since he failed levaquin, has +MRSA PCR, is healthcare-exposed  and immunosuppressed with multilobar pneumonia, there's high suspicion for MRSA which has grown on sputum culture. Rx doxycycline per susceptibility data. Blood cultures NGTD.    COPD: With exacerbation documented at admission/wheezing which has resolved.  - Completed steroid burst during admission  Dehydration with AKI: Improved with IVF.  Stage IIB (T3, N0, M0) adenocarcinoma of right lung: Dx Sept 2021.  - On chemotherapy (carboplatin, paclitaxel) initiated 10/18, last given 11/8 s/p 4 cycles, supervised by Dr. Julien Nordmann and undergoing radiation therapy, last was 11/10. Planning for surgery following this.   Weakness: Due to infection, dehydration, cancer and treatment.  - Improved  Hypomagnesemia:  - Supplement again today  Hypokalemia:  - Supplement and monitor  Folic acid deficiency:  - Supplement  Anemia of chronic disease:  - Monitor for bleeding   Hyponatremia: Resolved with volume resuscitation.  HTN:  - Continue home medications  Anxiety:  - Continue prn   Discharge Instructions Discharge Instructions    Discharge instructions   Complete by: As directed    You were admitted for pneumonia which has improved with antibiotics. The organism causing the pneumonia is treated with an antibiotic called doxycycline which has been prescribed (for 10 more doses, twice a day). You will also need to take folic acid due to mild deficiency. Both of these prescriptions were printed and should have hard copies provided to you at discharge because your [facility] is closed today.   Follow up with Dr. Julien Nordmann to discuss continuing vs. holding chemotherapy and seek medical attention if you notice worsening shortness of breath, chest pain, or GI bleeding.     Allergies as of 03/08/2020   No Known Allergies     Medication List    STOP taking these medications   levofloxacin 500 MG tablet  Commonly known as: LEVAQUIN     TAKE these medications   Combivent  Respimat 20-100 MCG/ACT Aers respimat Generic drug: Ipratropium-Albuterol Inhale 1-2 puffs into the lungs every 6 (six) hours as needed for wheezing.   doxycycline 100 MG tablet Commonly known as: VIBRA-TABS Take 1 tablet (100 mg total) by mouth every 12 (twelve) hours.   Duexis 800-26.6 MG Tabs Generic drug: Ibuprofen-Famotidine Take 1 tablet by mouth 3 (three) times daily as needed (pain.).   fenofibrate 160 MG tablet Take 1 tablet (160 mg total) by mouth daily.   folic acid 1 MG tablet Commonly known as: FOLVITE Take 1 tablet (1 mg total) by mouth daily.   guaiFENesin 600 MG 12 hr tablet Commonly known as: MUCINEX Take 600 mg by mouth daily as needed for cough.   HYDROcodone-homatropine 5-1.5 MG/5ML syrup Commonly known as: HYCODAN TAKE 5 MLS BY MOUTH EVERY 6 HOURS AS NEEDED FOR COUGH What changed: See the new instructions.   LORazepam 1 MG tablet Commonly known as: ATIVAN TAKE 1 TABLET BY MOUTH THREE TIMES DAILY AS NEEDED FOR ANXIETY What changed:   how much to take  how to take this  when to take this  additional instructions   losartan 100 MG tablet Commonly known as: COZAAR Take 1 tablet (100 mg total) by mouth daily.   LUMIFY OP Place 1 drop into both eyes daily as needed (redness).   meclizine 25 MG tablet Commonly known as: ANTIVERT Take 1 tablet (25 mg total) by mouth every 6 (six) hours as needed for dizziness.   prochlorperazine 10 MG tablet Commonly known as: COMPAZINE Take 1 tablet (10 mg total) by mouth every 6 (six) hours as needed for nausea or vomiting.   sildenafil 100 MG tablet Commonly known as: Viagra Take 0.5-1 tablets (50-100 mg total) by mouth daily as needed for erectile dysfunction.   sucralfate 1 g tablet Commonly known as: Carafate Take 1 tablet (1 g total) by mouth 4 (four) times daily -  with meals and at bedtime.   traZODone 50 MG tablet Commonly known as: DESYREL TAKE 1 TO 2 TABLETS BY MOUTH AT BEDTIME AS NEEDED FOR  SLEEP What changed:   how much to take  when to take this  additional instructions       Follow-up Information    Richard Nutting, DO Follow up.   Specialty: Family Medicine Contact information: [place] [address] [phone]        Curt Bears, MD Follow up.   Specialty: Oncology Contact information: [place] [address] [phone]              No Known Allergies  Consultations:  None  Procedures/Studies: DG Chest 1 View  Result Date: 03/05/2020 CLINICAL DATA:  History of lung cancer. Shortness of breath, fever and weakness. EXAM: CHEST  1 VIEW COMPARISON:  01/07/2020 FINDINGS: Heart size is normal. Left lung is clear. Pulmonary infiltrate affecting the right lower lobe and upper lobe consistent with bronchopneumonia. Cannot rule out an element of radiation affect or interstitial spread of tumor, but due to the extensive nature of the findings, infectious pneumonia seems likely. Small amount of pleural fluid on the right. IMPRESSION: Extensive infiltrate in the right upper and lower lobes consistent with infectious pneumonia. Small amount of pleural fluid on the right. Electronically Signed   By: Nelson Chimes M.D.   On: 03/05/2020 11:44     Subjective: Feels better, no shortness of breath but  does feel tired. No fevers. Coughing a lot up.   Discharge Exam: Vitals:   03/08/20 0611 03/08/20 0812  BP: 113/64   Pulse: 76   Resp: 18   Temp: 97.9 F (36.6 C)   SpO2: 96% 95%   General: Pt is alert, awake, not in acute distress Cardiovascular: RRR, S1/S2 +, no rubs, no gallops Respiratory: CTA bilaterally, no wheezing, no rhonchi Abdominal: Soft, NT, ND, bowel sounds + Extremities: No edema, no cyanosis  Labs: BNP (last 3 results) No results for input(s): BNP in the last 8760 hours. Basic Metabolic Panel: Recent Labs  Lab 03/02/20 0917 03/05/20 1708 03/05/20 2002 03/06/20 0544  03/07/20 0613 03/08/20 0543  NA 133* 131*  --  135 135 136  K 3.3* 3.7  --  3.5 3.0* 3.3*  CL 101 95*  --  102 101 101  CO2 23 23  --  23 24 27   GLUCOSE 112* 109*  --  128* 139* 94  BUN 20 26*  --  17 23* 19  CREATININE 1.84* 1.41*  --  0.94 0.80 0.68  CALCIUM 8.7* 8.0*  --  8.0* 8.3* 8.2*  MG  --   --  1.6* 1.5*  --  1.7  PHOS  --   --   --  2.8  --   --    Liver Function Tests: Recent Labs  Lab 03/02/20 0917 03/05/20 1708 03/06/20 0544  AST 20 22 20   ALT 13 18 17   ALKPHOS 67 55 51  BILITOT 0.8 2.4* 2.2*  PROT 6.4* 6.4* 5.5*  ALBUMIN 2.8* 2.8* 2.5*   No results for input(s): LIPASE, AMYLASE in the last 168 hours. No results for input(s): AMMONIA in the last 168 hours. CBC: Recent Labs  Lab 03/02/20 0917 03/05/20 1708 03/06/20 0544 03/07/20 0613 03/08/20 0543  WBC 6.5 4.1 3.2* 3.2* 3.5*  NEUTROABS 4.8 3.5 2.7 2.7 2.7  HGB 10.9* 9.4* 9.4* 8.9* 9.5*  HCT 30.1* 26.7* 26.8* 25.3* 27.2*  MCV 84.3 88.7 87.3 87.2 87.5  PLT 216 139* 120* 126* 131*   Cardiac Enzymes: No results for input(s): CKTOTAL, CKMB, CKMBINDEX, TROPONINI in the last 168 hours. BNP: Invalid input(s): POCBNP CBG: No results for input(s): GLUCAP in the last 168 hours. D-Dimer No results for input(s): DDIMER in the last 72 hours. Hgb A1c No results for input(s): HGBA1C in the last 72 hours. Lipid Profile No results for input(s): CHOL, HDL, LDLCALC, TRIG, CHOLHDL, LDLDIRECT in the last 72 hours. Thyroid function studies Recent Labs    03/06/20 0544  TSH 0.615   Anemia work up Recent Labs    03/05/20 2002  FOLATE 5.2*  FERRITIN 1,074*  TIBC 186*  IRON 59  RETICCTPCT 0.5   Urinalysis    Component Value Date/Time   COLORURINE AMBER (A) 03/05/2020 1820   APPEARANCEUR CLEAR 03/05/2020 1820   LABSPEC 1.011 03/05/2020 1820   PHURINE 5.0 03/05/2020 1820   GLUCOSEU NEGATIVE 03/05/2020 1820   GLUCOSEU NEGATIVE 12/12/2011 0944   HGBUR NEGATIVE 03/05/2020 1820   BILIRUBINUR NEGATIVE  03/05/2020 1820   KETONESUR NEGATIVE 03/05/2020 1820   PROTEINUR NEGATIVE 03/05/2020 1820   UROBILINOGEN 0.2 12/12/2011 0944   NITRITE NEGATIVE 03/05/2020 1820   LEUKOCYTESUR NEGATIVE 03/05/2020 1820    Microbiology Recent Results (from the past 240 hour(s))  Blood Culture (routine x 2)     Status: None (Preliminary result)   Collection Time: 03/05/20  5:08 PM   Specimen: BLOOD  Result Value Ref Range Status  Specimen Description   Final    BLOOD LEFT ANTECUBITAL Performed at [facility] [address], [place], [place] [address]    Special Requests   Final    BOTTLES DRAWN AEROBIC AND ANAEROBIC Blood Culture adequate volume Performed at [facility] [address] [place], [place] [address]    Culture   Final    NO GROWTH 2 DAYS Performed at [facility] [address] [place], [place] [address]    Report Status PENDING  Incomplete  Blood Culture (routine x 2)     Status: None (Preliminary result)   Collection Time: 03/05/20  5:08 PM   Specimen: BLOOD RIGHT FOREARM  Result Value Ref Range Status   Specimen Description   Final    BLOOD RIGHT FOREARM Performed at [facility] [address] [place], [place] [address]    Special Requests   Final    BOTTLES DRAWN AEROBIC AND ANAEROBIC Blood Culture adequate volume Performed at [facility] [address], [place], [place] [address]    Culture   Final    NO GROWTH 2 DAYS Performed at [facility] [address] [place], [place] [address]    Report Status PENDING  Incomplete  Urine culture     Status: Abnormal   Collection Time: 03/05/20  6:20 PM   Specimen: In/Out Cath Urine  Result Value Ref Range Status   Specimen Description   Final    IN/OUT CATH URINE Performed at [facility] [address] [place], [place] [address]    Special Requests   Final    NONE Performed at [facility], [place] [address], [place], [place] [address]    Culture 7,000 COLONIES/mL ENTEROCOCCUS FAECALIS (A)  Final   Report Status 03/08/2020 FINAL  Final   Organism ID, Bacteria ENTEROCOCCUS FAECALIS (A)  Final      Susceptibility   Enterococcus faecalis - MIC*    AMPICILLIN <=2 SENSITIVE Sensitive     NITROFURANTOIN <=16 SENSITIVE Sensitive     VANCOMYCIN 2 SENSITIVE Sensitive     * 7,000 COLONIES/mL ENTEROCOCCUS FAECALIS  Respiratory Panel by RT PCR (Flu A&B, Covid) - Nasopharyngeal Swab     Status: None   Collection Time: 03/05/20  7:32 PM   Specimen: Nasopharyngeal Swab  Result Value Ref Range Status   SARS Coronavirus 2 by RT PCR NEGATIVE NEGATIVE Final    Comment: (NOTE) SARS-CoV-2 target nucleic acids are NOT DETECTED.  The SARS-CoV-2 RNA is generally detectable in upper respiratoy specimens during the acute phase of infection. The lowest concentration of SARS-CoV-2 viral copies this assay can detect is 131 copies/mL. A negative result does not preclude SARS-Cov-2 infection and should not be used as the sole basis for treatment or other patient management decisions. A negative result may occur with  improper specimen collection/handling, submission of specimen other than nasopharyngeal swab, presence of viral mutation(s) within the areas targeted by this assay, and inadequate number of viral copies (<131 copies/mL). A negative result must be combined with clinical observations, patient history, and epidemiological information. The expected result is Negative.  Fact Sheet for Patients:  PinkCheek.be  Fact Sheet for Healthcare Providers:  GravelBags.it  This test is no t yet approved or cleared by the [place] FDA and  has been authorized for detection and/or diagnosis of SARS-CoV-2 by FDA under an Emergency Use Authorization (EUA). This EUA will remain  in effect (  meaning this test can be used) for the  duration of the COVID-19 declaration under Section 564(b)(1) of the Act, 21 U.S.C. section 360bbb-3(b)(1), unless the authorization is terminated or revoked sooner.     Influenza A by PCR NEGATIVE NEGATIVE Final   Influenza B by PCR NEGATIVE NEGATIVE Final    Comment: (NOTE) The Xpert Xpress SARS-CoV-2/FLU/RSV assay is intended as an aid in  the diagnosis of influenza from Nasopharyngeal swab specimens and  should not be used as a sole basis for treatment. Nasal washings and  aspirates are unacceptable for Xpert Xpress SARS-CoV-2/FLU/RSV  testing.  Fact Sheet for Patients: PinkCheek.be  Fact Sheet for Healthcare Providers: GravelBags.it  This test is not yet approved or cleared by the [place] FDA and  has been authorized for detection and/or diagnosis of SARS-CoV-2 by  FDA under an Emergency Use Authorization (EUA). This EUA will remain  in effect (meaning this test can be used) for the duration of the  Covid-19 declaration under Section 564(b)(1) of the Act, 21  U.S.C. section 360bbb-3(b)(1), unless the authorization is  terminated or revoked. Performed at [facility], [place] [address] [place], [place] [address]   MRSA PCR Screening     Status: Abnormal   Collection Time: 03/06/20  6:51 AM   Specimen: Nasopharyngeal  Result Value Ref Range Status   MRSA by PCR POSITIVE (A) NEGATIVE Final    Comment:        The GeneXpert MRSA Assay (FDA approved for NASAL specimens only), is one component of a comprehensive MRSA colonization surveillance program. It is not intended to diagnose MRSA infection nor to guide or monitor treatment for MRSA infections. RESULT CALLED TO, READ BACK BY AND VERIFIED WITH: A,BANO AT 1000 ON 03/06/20 BY A,MOHAMED Performed at [facility], [place] [address], [place], [place] [address]   Expectorated sputum assessment w rflx to resp cult     Status:  None   Collection Time: 03/06/20  6:58 AM   Specimen: Expectorated Sputum  Result Value Ref Range Status   Specimen Description EXPECTORATED SPUTUM  Final   Special Requests NONE  Final   Sputum evaluation   Final    THIS SPECIMEN IS ACCEPTABLE FOR SPUTUM CULTURE Performed at [facility], [place] [address] [place], [place] [address]    Report Status 03/06/2020 FINAL  Final  Culture, respiratory     Status: None   Collection Time: 03/06/20  6:58 AM  Result Value Ref Range Status   Specimen Description   Final    EXPECTORATED SPUTUM Performed at [facility] [address] [place], [place] [address]    Special Requests   Final    NONE Reflexed from [id number] Performed at [facility], [place] [address], [place], [place] [address]    Gram Stain   Final    NO WBC SEEN RARE GRAM POSITIVE COCCI IN PAIRS RARE GRAM VARIABLE ROD Performed at [facility], [place] [address], [place], [place] [address]    Culture FEW METHICILLIN RESISTANT STAPHYLOCOCCUS AUREUS  Final   Report Status 03/08/2020 FINAL  Final   Organism ID, Bacteria METHICILLIN RESISTANT STAPHYLOCOCCUS AUREUS  Final      Susceptibility   Methicillin resistant staphylococcus aureus - MIC*    CIPROFLOXACIN >=8 RESISTANT Resistant     ERYTHROMYCIN <=0.25 SENSITIVE Sensitive     GENTAMICIN <=0.5 SENSITIVE Sensitive     OXACILLIN >=4 RESISTANT Resistant     TETRACYCLINE <=  1 SENSITIVE Sensitive     VANCOMYCIN 1 SENSITIVE Sensitive     TRIMETH/SULFA <=10 SENSITIVE Sensitive     CLINDAMYCIN <=0.25 SENSITIVE Sensitive     RIFAMPIN <=0.5 SENSITIVE Sensitive     Inducible Clindamycin NEGATIVE Sensitive     * FEW METHICILLIN RESISTANT STAPHYLOCOCCUS AUREUS    Time coordinating discharge: Approximately 40 minutes  Patrecia Pour, MD  [facility] 03/08/2020, 9:10 AM

## 2020-03-08 NOTE — Progress Notes (Signed)
Nurse reviewed discharge instructions with pt. Pt verbalized understanding of discharge instructions, follow up appointments and new medications.  Printed prescription given to pt prior to discharge.

## 2020-03-09 ENCOUNTER — Other Ambulatory Visit: Payer: Self-pay | Admitting: Internal Medicine

## 2020-03-09 ENCOUNTER — Inpatient Hospital Stay: Payer: BC Managed Care – PPO

## 2020-03-09 ENCOUNTER — Ambulatory Visit
Admission: RE | Admit: 2020-03-09 | Discharge: 2020-03-09 | Disposition: A | Discharge status: Home / Self Care | Payer: BC Managed Care – PPO | Source: Ambulatory Visit | Attending: Radiation Oncology | Admitting: Radiation Oncology

## 2020-03-09 ENCOUNTER — Other Ambulatory Visit: Payer: Self-pay

## 2020-03-09 VITALS — BP 120/54 | HR 99 | Temp 97.9°F | Resp 16 | Wt 207.5 lb

## 2020-03-09 DIAGNOSIS — J45909 Unspecified asthma, uncomplicated: Secondary | ICD-10-CM | POA: Diagnosis not present

## 2020-03-09 DIAGNOSIS — I10 Essential (primary) hypertension: Secondary | ICD-10-CM | POA: Diagnosis not present

## 2020-03-09 DIAGNOSIS — C3491 Malignant neoplasm of unspecified part of right bronchus or lung: Secondary | ICD-10-CM

## 2020-03-09 DIAGNOSIS — F419 Anxiety disorder, unspecified: Secondary | ICD-10-CM | POA: Diagnosis not present

## 2020-03-09 DIAGNOSIS — Z510 Encounter for antineoplastic radiation therapy: Secondary | ICD-10-CM | POA: Diagnosis not present

## 2020-03-09 DIAGNOSIS — Z79899 Other long term (current) drug therapy: Secondary | ICD-10-CM | POA: Diagnosis not present

## 2020-03-09 DIAGNOSIS — J189 Pneumonia, unspecified organism: Secondary | ICD-10-CM | POA: Diagnosis not present

## 2020-03-09 DIAGNOSIS — Z5111 Encounter for antineoplastic chemotherapy: Secondary | ICD-10-CM | POA: Diagnosis present

## 2020-03-09 DIAGNOSIS — Z923 Personal history of irradiation: Secondary | ICD-10-CM | POA: Diagnosis not present

## 2020-03-09 DIAGNOSIS — R1310 Dysphagia, unspecified: Secondary | ICD-10-CM | POA: Diagnosis not present

## 2020-03-09 DIAGNOSIS — C3411 Malignant neoplasm of upper lobe, right bronchus or lung: Secondary | ICD-10-CM | POA: Diagnosis not present

## 2020-03-09 DIAGNOSIS — E860 Dehydration: Secondary | ICD-10-CM | POA: Diagnosis not present

## 2020-03-09 LAB — CMP (CANCER CENTER ONLY)
ALT: 20 U/L (ref 0–44)
AST: 27 U/L (ref 15–41)
Albumin: 2.1 g/dL — ABNORMAL LOW (ref 3.5–5.0)
Alkaline Phosphatase: 71 U/L (ref 38–126)
Anion gap: 7 (ref 5–15)
BUN: 14 mg/dL (ref 6–20)
CO2: 29 mmol/L (ref 22–32)
Calcium: 8.3 mg/dL — ABNORMAL LOW (ref 8.9–10.3)
Chloride: 97 mmol/L — ABNORMAL LOW (ref 98–111)
Creatinine: 0.69 mg/dL (ref 0.61–1.24)
GFR, Estimated: >60 mL/min (ref >60)
Glucose, Bld: 109 mg/dL — ABNORMAL HIGH (ref 70–99)
Potassium: 3 mmol/L — ABNORMAL LOW (ref 3.5–5.1)
Sodium: 133 mmol/L — ABNORMAL LOW (ref 135–145)
Total Bilirubin: 1 mg/dL (ref 0.3–1.2)
Total Protein: 5.3 g/dL — ABNORMAL LOW (ref 6.5–8.1)

## 2020-03-09 LAB — CBC WITH DIFFERENTIAL (CANCER CENTER ONLY)
Abs Immature Granulocytes: 0.03 10*3/uL (ref 0.00–0.07)
Basophils Absolute: 0 10*3/uL (ref 0.0–0.1)
Basophils Relative: 0 %
Eosinophils Absolute: 0 10*3/uL (ref 0.0–0.5)
Eosinophils Relative: 0 %
HCT: 26 % — ABNORMAL LOW (ref 39.0–52.0)
Hemoglobin: 9.2 g/dL — ABNORMAL LOW (ref 13.0–17.0)
Immature Granulocytes: 1 %
Lymphocytes Relative: 10 %
Lymphs Abs: 0.4 10*3/uL — ABNORMAL LOW (ref 0.7–4.0)
MCH: 30.4 pg (ref 26.0–34.0)
MCHC: 35.4 g/dL (ref 30.0–36.0)
MCV: 85.8 fL (ref 80.0–100.0)
Monocytes Absolute: 0.6 10*3/uL (ref 0.1–1.0)
Monocytes Relative: 15 %
Neutro Abs: 2.8 10*3/uL (ref 1.7–7.7)
Neutrophils Relative %: 74 %
Platelet Count: 128 10*3/uL — ABNORMAL LOW (ref 150–400)
RBC: 3.03 MIL/uL — ABNORMAL LOW (ref 4.22–5.81)
RDW: 12.9 % (ref 11.5–15.5)
WBC Count: 3.7 10*3/uL — ABNORMAL LOW (ref 4.0–10.5)
nRBC: 0 % (ref 0.0–0.2)

## 2020-03-09 MED ORDER — PALONOSETRON HCL INJECTION 0.25 MG/5ML: 0.2500 mg | Freq: Once | INTRAVENOUS | Status: DC

## 2020-03-09 MED ORDER — DIPHENHYDRAMINE HCL 50 MG/ML IJ SOLN: 50.0000 mg | Freq: Once | INTRAMUSCULAR | Status: DC

## 2020-03-09 MED ORDER — FAMOTIDINE IN NACL 20-0.9 MG/50ML-% IV SOLN: 20.0000 mg | Freq: Once | INTRAVENOUS | Status: DC

## 2020-03-09 MED ORDER — POTASSIUM CHLORIDE CRYS ER 20 MEQ PO TBCR: 20.0000 meq | EXTENDED_RELEASE_TABLET | Freq: Every day | ORAL | 0 refills | Status: DC

## 2020-03-09 MED ORDER — SODIUM CHLORIDE 0.9 % IV SOLN: 161.4000 mg | Freq: Once | INTRAVENOUS | Status: DC

## 2020-03-09 MED ORDER — DIPHENHYDRAMINE HCL 50 MG/ML IJ SOLN
INTRAMUSCULAR | Status: AC
  Filled 2020-03-09: qty 1

## 2020-03-09 MED ORDER — SODIUM CHLORIDE 0.9 % IV SOLN: 45.0000 mg/m2 | Freq: Once | INTRAVENOUS | Status: DC

## 2020-03-09 MED ORDER — PALONOSETRON HCL INJECTION 0.25 MG/5ML
INTRAVENOUS | Status: AC
  Filled 2020-03-09: qty 5

## 2020-03-09 MED ORDER — SODIUM CHLORIDE 0.9 % IV SOLN
20.0000 mg | Freq: Once | INTRAVENOUS | Status: DC
  Filled 2020-03-09: qty 2

## 2020-03-09 MED ORDER — FAMOTIDINE IN NACL 20-0.9 MG/50ML-% IV SOLN
INTRAVENOUS | Status: AC
  Filled 2020-03-09: qty 50

## 2020-03-09 MED ORDER — SODIUM CHLORIDE 0.9 % IV SOLN
Freq: Once | INTRAVENOUS | Status: AC
  Filled 2020-03-09: qty 250

## 2020-03-09 NOTE — Patient Instructions (Signed)
[place]: Treatment held today per MD order.

## 2020-03-10 ENCOUNTER — Ambulatory Visit
Admission: RE | Admit: 2020-03-10 | Discharge: 2020-03-10 | Disposition: A | Discharge status: Home / Self Care | Payer: BC Managed Care – PPO | Source: Ambulatory Visit | Attending: Radiation Oncology | Admitting: Radiation Oncology

## 2020-03-10 DIAGNOSIS — E876 Hypokalemia: Secondary | ICD-10-CM | POA: Diagnosis not present

## 2020-03-10 DIAGNOSIS — A419 Sepsis, unspecified organism: Secondary | ICD-10-CM | POA: Diagnosis not present

## 2020-03-10 LAB — CULTURE, BLOOD (ROUTINE X 2)
Culture: NO GROWTH
Culture: NO GROWTH
Special Requests: ADEQUATE
Special Requests: ADEQUATE

## 2020-03-10 LAB — METHYLMALONIC ACID, SERUM: Methylmalonic Acid, Quantitative: 222 nmol/L (ref 0–378)

## 2020-03-10 MED FILL — POTASSIUM CHLORIDE CRYS ER: 20 | 7 days supply | Qty: 7 | Fill #0

## 2020-03-11 ENCOUNTER — Ambulatory Visit
Admission: RE | Admit: 2020-03-11 | Discharge: 2020-03-11 | Disposition: A | Discharge status: Home / Self Care | Payer: BC Managed Care – PPO | Source: Ambulatory Visit | Attending: Radiation Oncology | Admitting: Radiation Oncology

## 2020-03-11 ENCOUNTER — Telehealth: Payer: Self-pay

## 2020-03-11 NOTE — Telephone Encounter (Signed)
LM for pt advising a rx of potassium has been sent to his pharmacy.

## 2020-03-11 NOTE — Telephone Encounter (Signed)
TC from Pt. Stating he was here for radiation today. Pt. stated he is having fever of 101 and chills. Asked Pt. If he would like to come in to symptom management clinic today. Pt. Stated he lived 30 minutes away and would like to come in tomorrow. Informed Pt symptom management clinic is closed tomorrow. Consulted with Dr. Julien Nordmann about Pt's complaints per Dr. Julien Nordmann Pt. should increase fluid intake. Take tylenol for fever. Continue antibiotic given at hospital discharge. And if symptoms get worse Pt. Should return to the ER. Pt. Verbalized understanding, No further problems or concerns noted.

## 2020-03-12 ENCOUNTER — Encounter: Payer: Self-pay | Admitting: Internal Medicine

## 2020-03-12 ENCOUNTER — Ambulatory Visit: Payer: BC Managed Care – PPO

## 2020-03-13 ENCOUNTER — Ambulatory Visit
Admission: RE | Admit: 2020-03-13 | Discharge: 2020-03-13 | Disposition: A | Discharge status: Home / Self Care | Payer: BC Managed Care – PPO | Source: Ambulatory Visit | Attending: Radiation Oncology | Admitting: Radiation Oncology

## 2020-03-13 ENCOUNTER — Inpatient Hospital Stay (HOSPITAL_COMMUNITY)
Admission: EM | Admit: 2020-03-13 | Discharge: 2020-03-17 | DRG: 871 | Disposition: A | Discharge status: Home / Self Care | Payer: BC Managed Care – PPO | Attending: Student | Admitting: Student

## 2020-03-13 ENCOUNTER — Other Ambulatory Visit: Payer: Self-pay

## 2020-03-13 ENCOUNTER — Telehealth: Payer: Self-pay | Admitting: Medical Oncology

## 2020-03-13 ENCOUNTER — Emergency Department (HOSPITAL_COMMUNITY): Payer: BC Managed Care – PPO

## 2020-03-13 ENCOUNTER — Encounter (HOSPITAL_COMMUNITY): Payer: Self-pay

## 2020-03-13 DIAGNOSIS — R5381 Other malaise: Secondary | ICD-10-CM | POA: Diagnosis not present

## 2020-03-13 DIAGNOSIS — Z8249 Family history of ischemic heart disease and other diseases of the circulatory system: Secondary | ICD-10-CM

## 2020-03-13 DIAGNOSIS — M7989 Other specified soft tissue disorders: Secondary | ICD-10-CM | POA: Diagnosis not present

## 2020-03-13 DIAGNOSIS — J440 Chronic obstructive pulmonary disease with acute lower respiratory infection: Secondary | ICD-10-CM | POA: Diagnosis present

## 2020-03-13 DIAGNOSIS — Z87891 Personal history of nicotine dependence: Secondary | ICD-10-CM

## 2020-03-13 DIAGNOSIS — Z79899 Other long term (current) drug therapy: Secondary | ICD-10-CM

## 2020-03-13 DIAGNOSIS — F411 Generalized anxiety disorder: Secondary | ICD-10-CM | POA: Diagnosis present

## 2020-03-13 DIAGNOSIS — Z85118 Personal history of other malignant neoplasm of bronchus and lung: Secondary | ICD-10-CM

## 2020-03-13 DIAGNOSIS — F410 Panic disorder [episodic paroxysmal anxiety] without agoraphobia: Secondary | ICD-10-CM | POA: Diagnosis present

## 2020-03-13 DIAGNOSIS — K219 Gastro-esophageal reflux disease without esophagitis: Secondary | ICD-10-CM | POA: Diagnosis present

## 2020-03-13 DIAGNOSIS — E876 Hypokalemia: Secondary | ICD-10-CM

## 2020-03-13 DIAGNOSIS — Z803 Family history of malignant neoplasm of breast: Secondary | ICD-10-CM | POA: Diagnosis not present

## 2020-03-13 DIAGNOSIS — G4700 Insomnia, unspecified: Secondary | ICD-10-CM | POA: Diagnosis present

## 2020-03-13 DIAGNOSIS — A419 Sepsis, unspecified organism: Principal | ICD-10-CM

## 2020-03-13 DIAGNOSIS — Z825 Family history of asthma and other chronic lower respiratory diseases: Secondary | ICD-10-CM

## 2020-03-13 DIAGNOSIS — J90 Pleural effusion, not elsewhere classified: Secondary | ICD-10-CM | POA: Diagnosis not present

## 2020-03-13 DIAGNOSIS — R6521 Severe sepsis with septic shock: Secondary | ICD-10-CM | POA: Diagnosis present

## 2020-03-13 DIAGNOSIS — C3491 Malignant neoplasm of unspecified part of right bronchus or lung: Secondary | ICD-10-CM

## 2020-03-13 DIAGNOSIS — I10 Essential (primary) hypertension: Secondary | ICD-10-CM | POA: Diagnosis present

## 2020-03-13 DIAGNOSIS — D638 Anemia in other chronic diseases classified elsewhere: Secondary | ICD-10-CM | POA: Diagnosis present

## 2020-03-13 DIAGNOSIS — Y95 Nosocomial condition: Secondary | ICD-10-CM | POA: Diagnosis present

## 2020-03-13 DIAGNOSIS — Z87442 Personal history of urinary calculi: Secondary | ICD-10-CM

## 2020-03-13 DIAGNOSIS — J189 Pneumonia, unspecified organism: Secondary | ICD-10-CM

## 2020-03-13 DIAGNOSIS — J309 Allergic rhinitis, unspecified: Secondary | ICD-10-CM | POA: Diagnosis present

## 2020-03-13 DIAGNOSIS — Z20822 Contact with and (suspected) exposure to covid-19: Secondary | ICD-10-CM | POA: Diagnosis present

## 2020-03-13 DIAGNOSIS — I959 Hypotension, unspecified: Secondary | ICD-10-CM

## 2020-03-13 DIAGNOSIS — F419 Anxiety disorder, unspecified: Secondary | ICD-10-CM | POA: Diagnosis not present

## 2020-03-13 DIAGNOSIS — J180 Bronchopneumonia, unspecified organism: Secondary | ICD-10-CM | POA: Diagnosis present

## 2020-03-13 DIAGNOSIS — E785 Hyperlipidemia, unspecified: Secondary | ICD-10-CM | POA: Diagnosis present

## 2020-03-13 DIAGNOSIS — R6520 Severe sepsis without septic shock: Secondary | ICD-10-CM

## 2020-03-13 LAB — CBC
HCT: 23.2 % — ABNORMAL LOW (ref 39.0–52.0)
Hemoglobin: 8.1 g/dL — ABNORMAL LOW (ref 13.0–17.0)
MCH: 30.7 pg (ref 26.0–34.0)
MCHC: 34.9 g/dL (ref 30.0–36.0)
MCV: 87.9 fL (ref 80.0–100.0)
Platelets: 188 10*3/uL (ref 150–400)
RBC: 2.64 MIL/uL — ABNORMAL LOW (ref 4.22–5.81)
RDW: 12.9 % (ref 11.5–15.5)
WBC: 4.8 10*3/uL (ref 4.0–10.5)
nRBC: 0 % (ref 0.0–0.2)

## 2020-03-13 LAB — URINALYSIS, ROUTINE W REFLEX MICROSCOPIC
Bilirubin Urine: NEGATIVE
Glucose, UA: NEGATIVE mg/dL
Hgb urine dipstick: NEGATIVE
Ketones, ur: NEGATIVE mg/dL
Leukocytes,Ua: NEGATIVE
Nitrite: NEGATIVE
Protein, ur: NEGATIVE mg/dL
Specific Gravity, Urine: 1.005 (ref 1.005–1.030)
pH: 6 (ref 5.0–8.0)

## 2020-03-13 LAB — COMPREHENSIVE METABOLIC PANEL
ALT: 24 U/L (ref 0–44)
AST: 45 U/L — ABNORMAL HIGH (ref 15–41)
Albumin: 2.5 g/dL — ABNORMAL LOW (ref 3.5–5.0)
Alkaline Phosphatase: 84 U/L (ref 38–126)
Anion gap: 11 (ref 5–15)
BUN: 11 mg/dL (ref 6–20)
CO2: 29 mmol/L (ref 22–32)
Calcium: 8.3 mg/dL — ABNORMAL LOW (ref 8.9–10.3)
Chloride: 92 mmol/L — ABNORMAL LOW (ref 98–111)
Creatinine, Ser: 0.91 mg/dL (ref 0.61–1.24)
GFR, Estimated: >60 mL/min (ref >60)
Glucose, Bld: 111 mg/dL — ABNORMAL HIGH (ref 70–99)
Potassium: 2.5 mmol/L — CL (ref 3.5–5.1)
Sodium: 132 mmol/L — ABNORMAL LOW (ref 135–145)
Total Bilirubin: 1.1 mg/dL (ref 0.3–1.2)
Total Protein: 6.1 g/dL — ABNORMAL LOW (ref 6.5–8.1)

## 2020-03-13 LAB — CBC WITH DIFFERENTIAL/PLATELET
Abs Immature Granulocytes: 0.05 10*3/uL (ref 0.00–0.07)
Basophils Absolute: 0 10*3/uL (ref 0.0–0.1)
Basophils Relative: 0 %
Eosinophils Absolute: 0.1 10*3/uL (ref 0.0–0.5)
Eosinophils Relative: 1 %
HCT: 25.8 % — ABNORMAL LOW (ref 39.0–52.0)
Hemoglobin: 8.9 g/dL — ABNORMAL LOW (ref 13.0–17.0)
Immature Granulocytes: 1 %
Lymphocytes Relative: 11 %
Lymphs Abs: 0.5 10*3/uL — ABNORMAL LOW (ref 0.7–4.0)
MCH: 30.3 pg (ref 26.0–34.0)
MCHC: 34.5 g/dL (ref 30.0–36.0)
MCV: 87.8 fL (ref 80.0–100.0)
Monocytes Absolute: 0.6 10*3/uL (ref 0.1–1.0)
Monocytes Relative: 14 %
Neutro Abs: 3.3 10*3/uL (ref 1.7–7.7)
Neutrophils Relative %: 73 %
Platelets: 191 10*3/uL (ref 150–400)
RBC: 2.94 MIL/uL — ABNORMAL LOW (ref 4.22–5.81)
RDW: 12.8 % (ref 11.5–15.5)
WBC: 4.5 10*3/uL (ref 4.0–10.5)
nRBC: 0 % (ref 0.0–0.2)

## 2020-03-13 LAB — PROTIME-INR
INR: 1.1 (ref 0.8–1.2)
Prothrombin Time: 14.1 seconds (ref 11.4–15.2)

## 2020-03-13 LAB — TROPONIN I (HIGH SENSITIVITY)
Troponin I (High Sensitivity): 5 ng/L (ref <18)
Troponin I (High Sensitivity): 4 ng/L (ref <18)

## 2020-03-13 LAB — RESP PANEL BY RT-PCR (FLU A&B, COVID) ARPGX2
Influenza A by PCR: NEGATIVE
Influenza B by PCR: NEGATIVE
SARS Coronavirus 2 by RT PCR: NEGATIVE

## 2020-03-13 LAB — CREATININE, SERUM
Creatinine, Ser: 0.6 mg/dL — ABNORMAL LOW (ref 0.61–1.24)
GFR, Estimated: >60 mL/min (ref >60)

## 2020-03-13 LAB — APTT: aPTT: 29 seconds (ref 24–36)

## 2020-03-13 LAB — LACTIC ACID, PLASMA: Lactic Acid, Venous: 1.9 mmol/L (ref 0.5–1.9)

## 2020-03-13 LAB — D-DIMER, QUANTITATIVE: D-Dimer, Quant: 1.36 ug/mL-FEU — ABNORMAL HIGH (ref 0.00–0.50)

## 2020-03-13 LAB — MAGNESIUM: Magnesium: 1.8 mg/dL (ref 1.7–2.4)

## 2020-03-13 IMAGING — CT CT ANGIO CHEST
2 of 6 series · 17 of 36 positions shown · IV contrast (omnipaque)
Comparison: Chest radiograph from earlier today. [DATE] PET-CT.
[DATE] chest CT.

CLINICAL DATA: Fever. Hypotension. Anemia. Lung cancer. Ongoing
radiation therapy and chemotherapy.

EXAM:
CT ANGIOGRAPHY CHEST WITH CONTRAST
TECHNIQUE: Multidetector CT imaging of the chest was performed using the
standard protocol during bolus administration of intravenous
contrast. Multiplanar CT image reconstructions and MIPs were
obtained to evaluate the vascular anatomy.
CONTRAST:  80mL OMNIPAQUE IOHEXOL 350 MG/ML SOLN

[Series 5: thins · axial · 0.82mm/px · z∈[+1240,+1528]mm · 16 of 325 slices shown]
[im 19/325  lung]
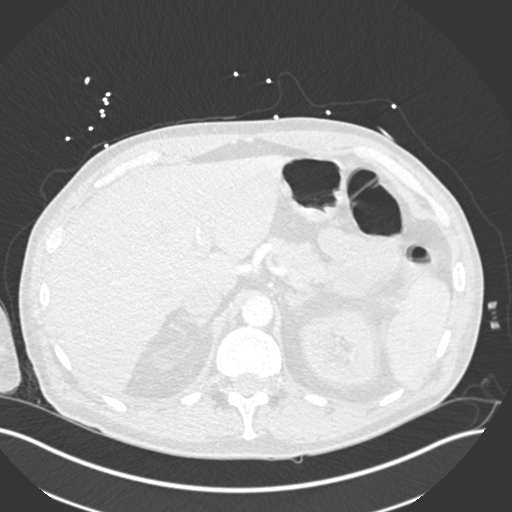
[im 37/325  mediastinal]
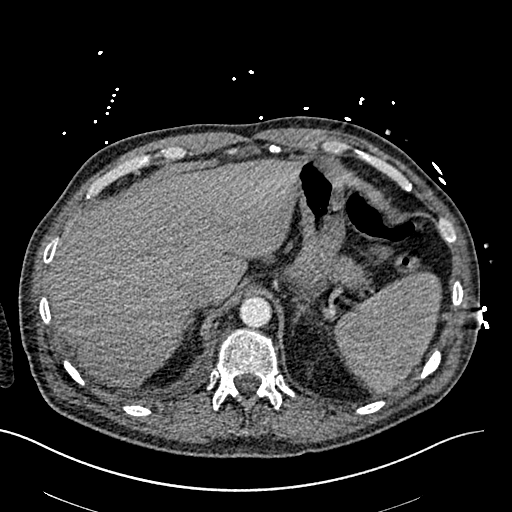
[im 55/325  lung]
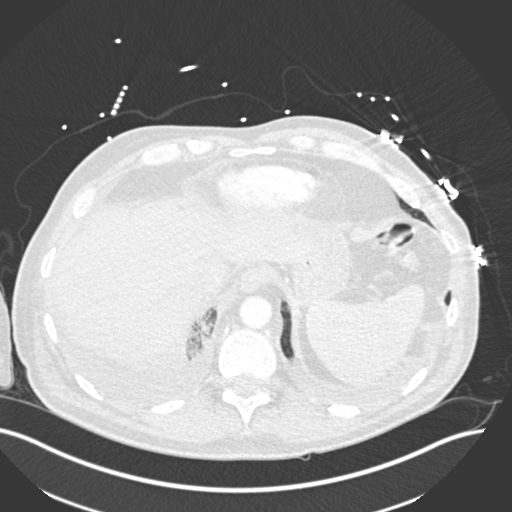
[im 73/325  mediastinal]
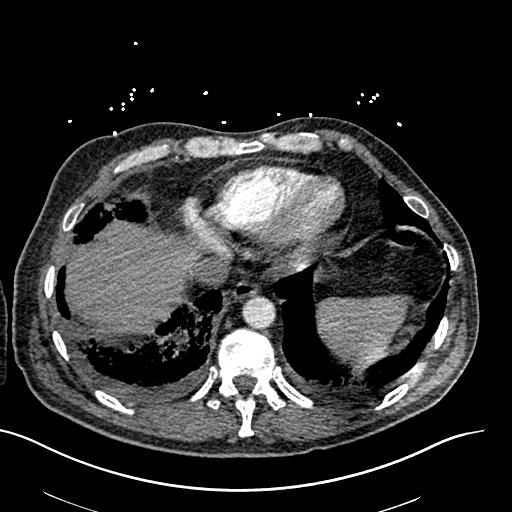
[im 91/325  lung]
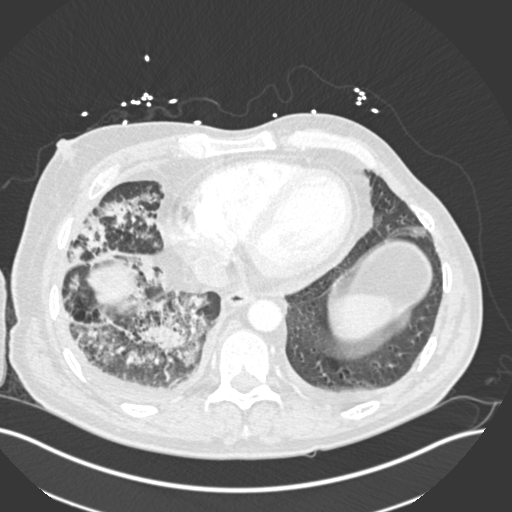
[im 109/325  mediastinal]
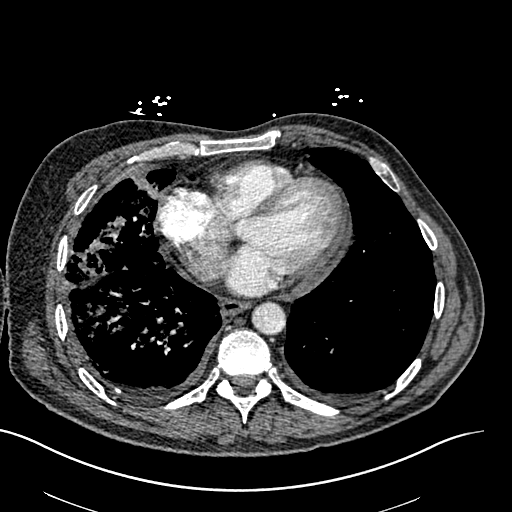
[im 127/325  lung]
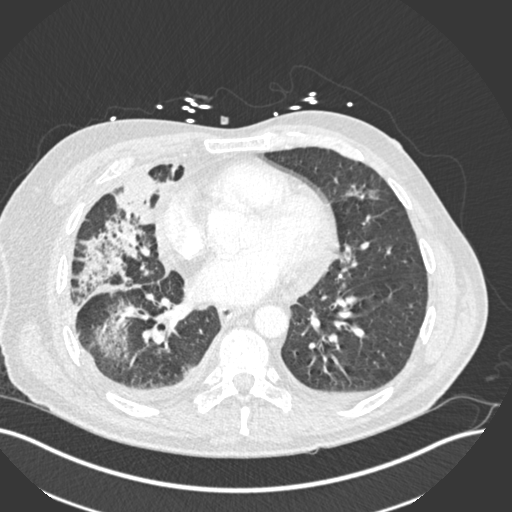
[im 145/325  mediastinal]
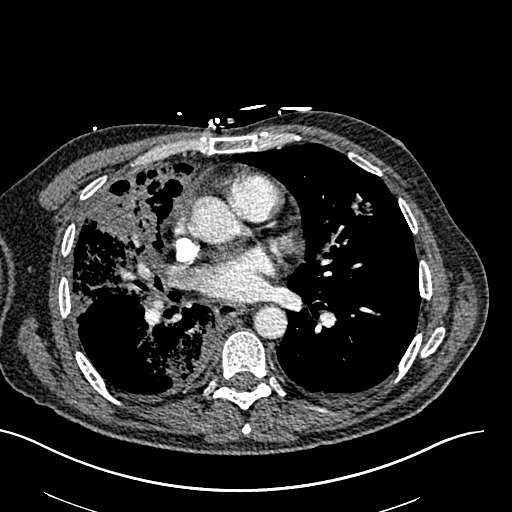
[im 181/325  lung]
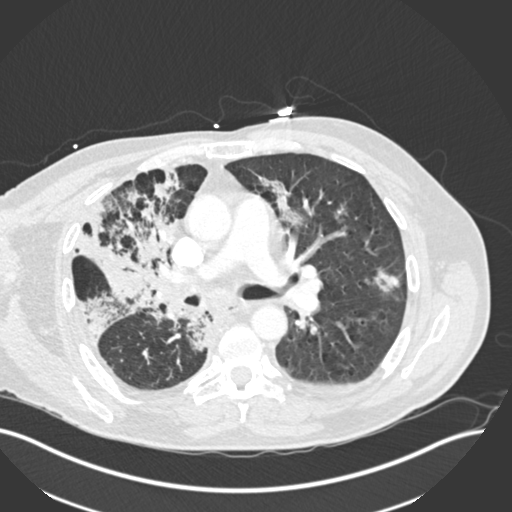
[im 199/325  mediastinal]
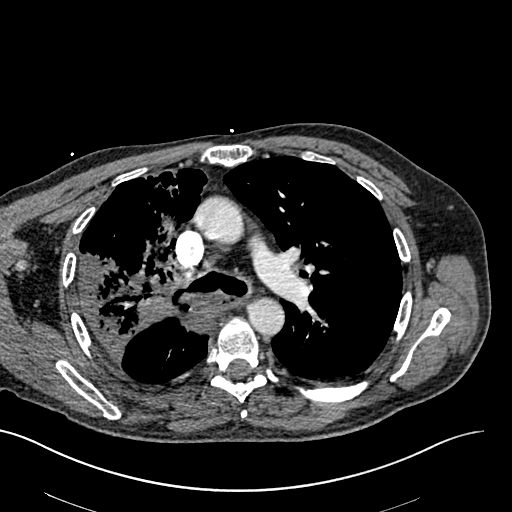
[im 217/325  lung]
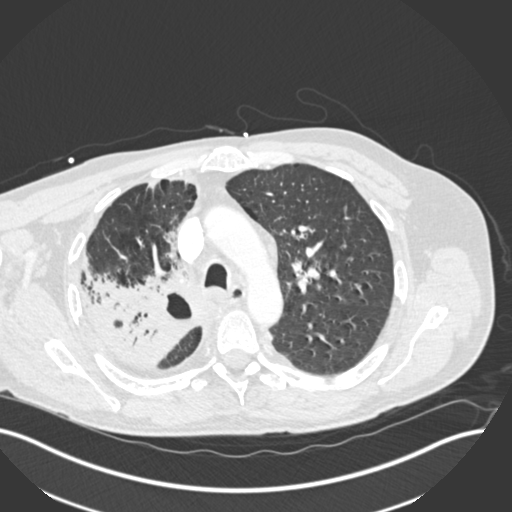
[im 235/325  mediastinal]
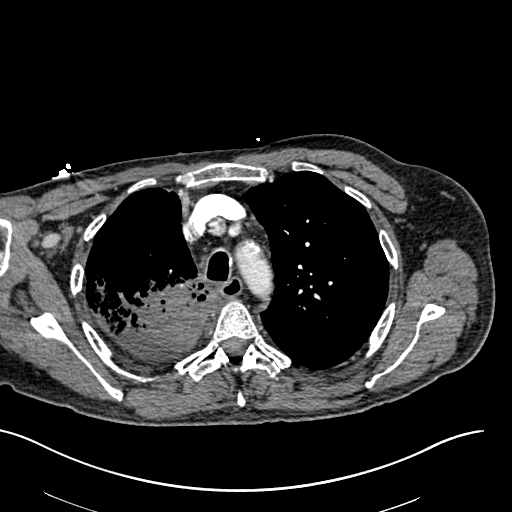
[im 253/325  lung]
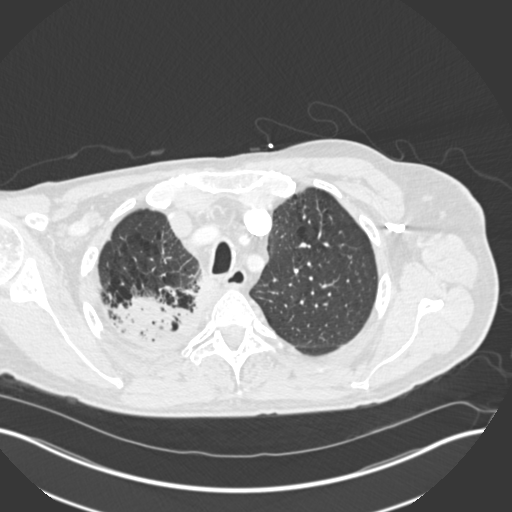
[im 271/325  mediastinal]
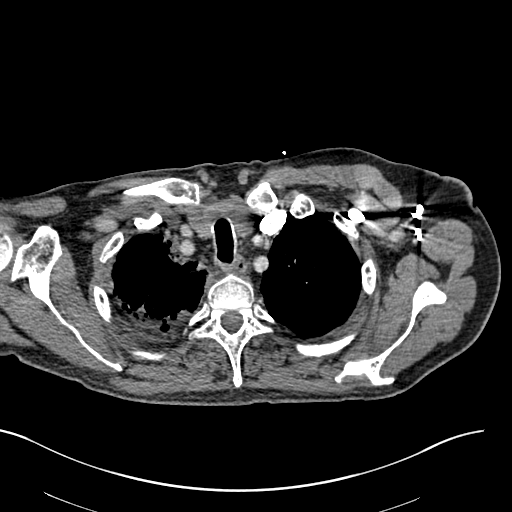
[im 289/325  lung]
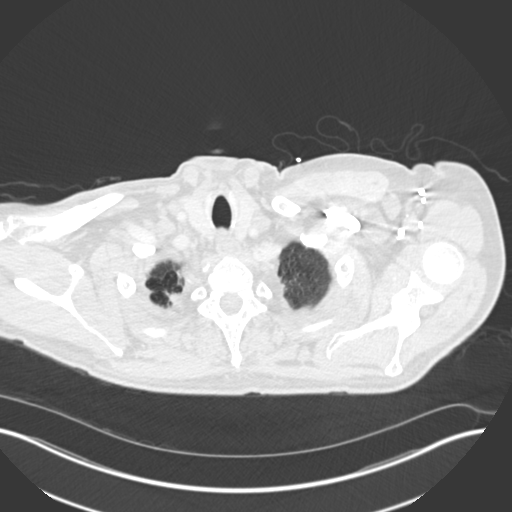
[im 307/325  mediastinal]
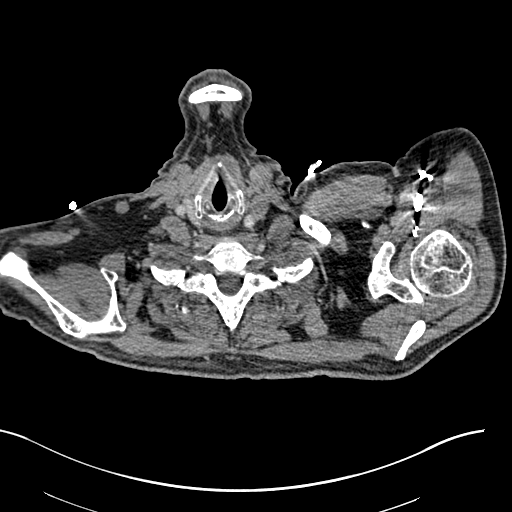

[Series 6: coronal mpr · coronal · 0.65mm/px · 1 of 151 slices shown]
[im 76/151  mediastinal]
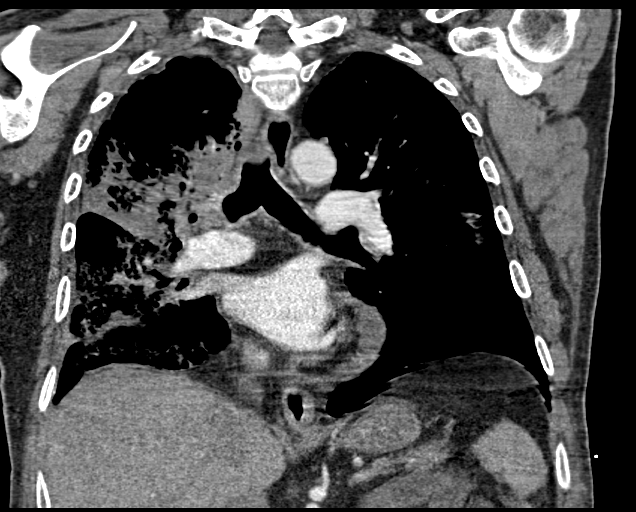

[17 of 36 positions shown; findings below may reference images not displayed]

FINDINGS: Cardiovascular: The study is moderate quality for the evaluation of
pulmonary embolism, with some motion degradation. There are no
convincing filling defects in the central, lobar, segmental or
subsegmental pulmonary artery branches to suggest acute pulmonary
embolism. Atherosclerotic nonaneurysmal thoracic aorta. Stable
top-normal caliber main pulmonary artery (3.0 cm diameter). Normal
heart size. Stable trace pericardial effusion/thickening.

Mediastinum/Nodes: No discrete thyroid nodules. Unremarkable
esophagus. No axillary adenopathy. Mildly enlarged 1.2 cm subcarinal
node (series 4/image 75), new. Mildly enlarged 1.0 cm right hilar
node (series 4/image 71) new. No left hilar adenopathy.

Lungs/Pleura: No pneumothorax. Small dependent bilateral pleural
effusions, new bilaterally. Moderate centrilobular and paraseptal
emphysema with diffuse bronchial wall thickening and saber sheath
trachea. Extensive patchy consolidation and ground-glass opacity
throughout the right lung involving all right lung lobes with
associated air bronchograms, new since [DATE] PET-CT, obscuring
the margins of the cavitary right upper perihilar lung mass, which
measures approximately 4.2 x 3.0 cm (series 10/image 58), not
substantially changed since [DATE] PET-CT. New patchy
peribronchovascular irregular foci of consolidation and ground-glass
opacity throughout left upper lobe. Stable solid 0.6 cm posterior
left upper lobe pulmonary nodule (series 10/image 88).

Upper abdomen: No acute abnormality.

Musculoskeletal:  No aggressive appearing focal osseous lesions.

Review of the MIP images confirms the above findings.
IMPRESSION: 1. No evidence of acute pulmonary embolism.
2. Extensive patchy consolidation and ground-glass opacity
throughout the right greater than left lungs, new since [DATE]
PET-CT. Findings favor multilobar pneumonia, with differential
including drug reaction. A portion of the right lung opacities may
represent early post treatment change. Close chest CT follow-up
recommended.
3. Cavitary perihilar right upper lung mass is obscured and is
grossly stable. Stable subcentimeter solid left lower lobe pulmonary
nodule.
4. New small dependent bilateral pleural effusions.
5. New mild subcarinal and right hilar lymphadenopathy, nonspecific,
suggest attention on follow-up chest CT with IV contrast in 3
months.
6. Aortic Atherosclerosis ([O4]-[O4]) and Emphysema ([O4]-[O4]).

## 2020-03-13 IMAGING — DX DG CHEST 1V PORT
1 series · 1 of 1 positions shown · non-contrast
Comparison: Radiographs [DATE] and [DATE]. CT [DATE]
and PET-CT [DATE].

CLINICAL DATA: Hypotension and fever.  Possible sepsis.

EXAM:
PORTABLE CHEST 1 VIEW

[chest ap]
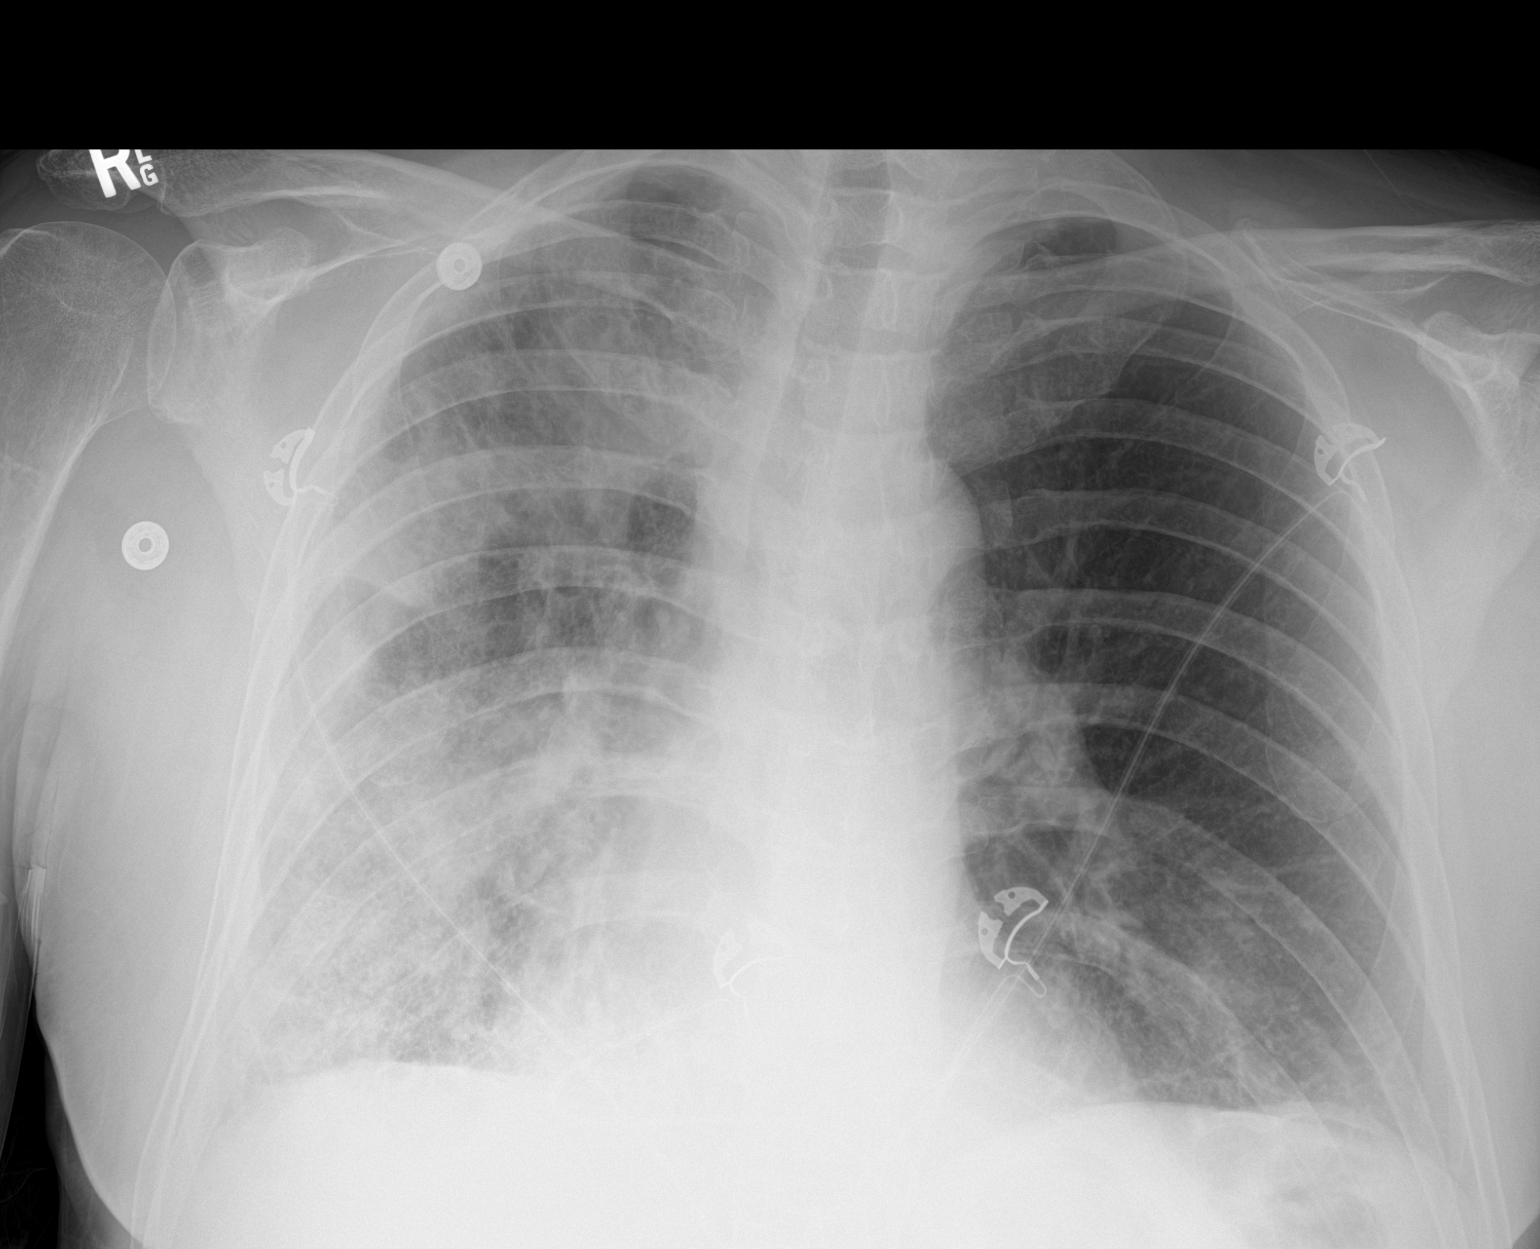

[1 of 1 positions shown; findings below may reference images not displayed]

FINDINGS: [XM] hours. The heart size and mediastinal contours are stable
without gross adenopathy. There is progressive airspace disease
throughout the right lung with mild volume loss. This obscures the
previously demonstrated cavitary lesion in the right upper lobe. The
left lung is clear. There is no pleural effusion or pneumothorax. No
acute osseous findings are seen.
IMPRESSION: Progressive right lung airspace disease suspicious for pneumonia in
this clinical setting. In this patient with recently diagnosed lung
cancer, these findings could relate to radiation therapy. Correlate
clinically.

## 2020-03-13 MED ORDER — SODIUM CHLORIDE 0.9 % IV BOLUS
1000.0000 mL | Freq: Once | INTRAVENOUS | Status: AC
  Administered 2020-03-13: 1000 mL via INTRAVENOUS

## 2020-03-13 MED ORDER — SODIUM CHLORIDE 0.9 % IV SOLN
500.0000 mg | Freq: Once | INTRAVENOUS | Status: AC
  Administered 2020-03-13: 500 mg via INTRAVENOUS
  Filled 2020-03-13: qty 500

## 2020-03-13 MED ORDER — POTASSIUM CHLORIDE CRYS ER 20 MEQ PO TBCR
40.0000 meq | EXTENDED_RELEASE_TABLET | Freq: Once | ORAL | Status: AC
  Administered 2020-03-13: 40 meq via ORAL
  Filled 2020-03-13: qty 2

## 2020-03-13 MED ORDER — VANCOMYCIN HCL 1500 MG/300ML IV SOLN
1500.0000 mg | Freq: Two times a day (BID) | INTRAVENOUS | Status: DC
  Administered 2020-03-13 – 2020-03-16 (×6): 1500 mg via INTRAVENOUS
  Filled 2020-03-13 (×6): qty 300

## 2020-03-13 MED ORDER — MAGNESIUM SULFATE 2 GM/50ML IV SOLN
2.0000 g | Freq: Once | INTRAVENOUS | Status: AC
  Administered 2020-03-13: 2 g via INTRAVENOUS
  Filled 2020-03-13: qty 50

## 2020-03-13 MED ORDER — LACTATED RINGERS IV BOLUS (SEPSIS)
2000.0000 mL | Freq: Once | INTRAVENOUS | Status: AC
  Administered 2020-03-13: 2000 mL via INTRAVENOUS

## 2020-03-13 MED ORDER — SODIUM CHLORIDE 0.9 % IV SOLN
2.0000 g | Freq: Once | INTRAVENOUS | Status: AC
  Administered 2020-03-13: 2 g via INTRAVENOUS
  Filled 2020-03-13: qty 2

## 2020-03-13 MED ORDER — ACETAMINOPHEN 650 MG RE SUPP: 650.0000 mg | Freq: Four times a day (QID) | RECTAL | Status: DC | PRN

## 2020-03-13 MED ORDER — POTASSIUM CHLORIDE 10 MEQ/100ML IV SOLN
10.0000 meq | INTRAVENOUS | Status: AC
  Administered 2020-03-13 (×2): 10 meq via INTRAVENOUS
  Filled 2020-03-13 (×2): qty 100

## 2020-03-13 MED ORDER — ACETAMINOPHEN 325 MG PO TABS
650.0000 mg | ORAL_TABLET | Freq: Once | ORAL | Status: AC
  Administered 2020-03-13: 650 mg via ORAL
  Filled 2020-03-13: qty 2

## 2020-03-13 MED ORDER — ONDANSETRON HCL 4 MG PO TABS: 4.0000 mg | ORAL_TABLET | Freq: Four times a day (QID) | ORAL | Status: DC | PRN

## 2020-03-13 MED ORDER — LACTATED RINGERS IV SOLN: INTRAVENOUS | Status: DC

## 2020-03-13 MED ORDER — GUAIFENESIN ER 600 MG PO TB12
600.0000 mg | ORAL_TABLET | Freq: Two times a day (BID) | ORAL | Status: DC
  Administered 2020-03-13 – 2020-03-17 (×8): 600 mg via ORAL
  Filled 2020-03-13 (×8): qty 1

## 2020-03-13 MED ORDER — HYDROCORTISONE NA SUCCINATE PF 100 MG IJ SOLR
50.0000 mg | Freq: Three times a day (TID) | INTRAMUSCULAR | Status: DC
  Administered 2020-03-13 – 2020-03-15 (×5): 50 mg via INTRAVENOUS
  Filled 2020-03-13 (×5): qty 2

## 2020-03-13 MED ORDER — VANCOMYCIN HCL IN DEXTROSE 1-5 GM/200ML-% IV SOLN
1000.0000 mg | Freq: Once | INTRAVENOUS | Status: AC
  Administered 2020-03-13: 1000 mg via INTRAVENOUS
  Filled 2020-03-13: qty 200

## 2020-03-13 MED ORDER — LORAZEPAM 1 MG PO TABS
1.0000 mg | ORAL_TABLET | Freq: Three times a day (TID) | ORAL | Status: DC | PRN
  Administered 2020-03-13 – 2020-03-16 (×6): 1 mg via ORAL
  Filled 2020-03-13 (×6): qty 1

## 2020-03-13 MED ORDER — ONDANSETRON HCL 4 MG/2ML IJ SOLN: 4.0000 mg | Freq: Four times a day (QID) | INTRAMUSCULAR | Status: DC | PRN

## 2020-03-13 MED ORDER — ACETAMINOPHEN 325 MG PO TABS
650.0000 mg | ORAL_TABLET | Freq: Four times a day (QID) | ORAL | Status: DC | PRN
  Administered 2020-03-13 – 2020-03-17 (×5): 650 mg via ORAL
  Filled 2020-03-13 (×5): qty 2

## 2020-03-13 MED ORDER — SODIUM CHLORIDE 0.9 % IV SOLN: INTRAVENOUS | Status: DC

## 2020-03-13 MED ORDER — FENOFIBRATE 160 MG PO TABS
160.0000 mg | ORAL_TABLET | Freq: Every day | ORAL | Status: DC
  Administered 2020-03-14 – 2020-03-17 (×4): 160 mg via ORAL
  Filled 2020-03-13 (×4): qty 1

## 2020-03-13 MED ORDER — ENOXAPARIN SODIUM 40 MG/0.4ML ~~LOC~~ SOLN
40.0000 mg | SUBCUTANEOUS | Status: DC
  Administered 2020-03-13 – 2020-03-16 (×4): 40 mg via SUBCUTANEOUS
  Filled 2020-03-13 (×4): qty 0.4

## 2020-03-13 MED ORDER — IOHEXOL 350 MG/ML SOLN
80.0000 mL | Freq: Once | INTRAVENOUS | Status: AC | PRN
  Administered 2020-03-13: 80 mL via INTRAVENOUS

## 2020-03-13 MED ORDER — FOLIC ACID 1 MG PO TABS
1.0000 mg | ORAL_TABLET | Freq: Every day | ORAL | Status: DC
  Administered 2020-03-14 – 2020-03-17 (×4): 1 mg via ORAL
  Filled 2020-03-13 (×4): qty 1

## 2020-03-13 MED ORDER — HYDROCODONE-HOMATROPINE 5-1.5 MG/5ML PO SYRP
5.0000 mL | ORAL_SOLUTION | Freq: Four times a day (QID) | ORAL | Status: DC | PRN
  Administered 2020-03-13 – 2020-03-17 (×9): 5 mL via ORAL
  Filled 2020-03-13 (×9): qty 5

## 2020-03-13 MED ORDER — ALBUTEROL SULFATE (2.5 MG/3ML) 0.083% IN NEBU: 2.5000 mg | INHALATION_SOLUTION | RESPIRATORY_TRACT | Status: DC | PRN

## 2020-03-13 MED ORDER — SODIUM CHLORIDE 0.9 % IV SOLN
2.0000 g | Freq: Three times a day (TID) | INTRAVENOUS | Status: DC
  Administered 2020-03-13 – 2020-03-17 (×11): 2 g via INTRAVENOUS
  Filled 2020-03-13 (×13): qty 2

## 2020-03-13 MED ORDER — SODIUM CHLORIDE (PF) 0.9 % IJ SOLN
INTRAMUSCULAR | Status: AC
  Filled 2020-03-13: qty 50

## 2020-03-13 MED ORDER — HYDROCORTISONE NA SUCCINATE PF 100 MG IJ SOLR
100.0000 mg | Freq: Once | INTRAMUSCULAR | Status: AC
  Administered 2020-03-13: 100 mg via INTRAVENOUS
  Filled 2020-03-13: qty 2

## 2020-03-13 MED FILL — Dexamethasone Sodium Phosphate Inj 100 MG/10ML: INTRAMUSCULAR | Qty: 2 | Status: AC

## 2020-03-13 NOTE — ED Provider Notes (Signed)
[place] DEPT Provider Note   CSN: [id number] Arrival date & time: 03/13/20  1020    History Chief Complaint  Patient presents with  . Hypotension  . Abnormal Lab    Richard Li is a 59 y.o. male with past medical history significant for COPD, cavitating right lung mass, active lung cancer who presents for evaluation of weakness and fever.  Recently discharged 4 days ago for community-acquired pneumonia.  He is still on antibiotics orally.  He continues to have fevers up to 103 at home.  Last fever yesterday evening.  He has been taking Tylenol.  Has had persistent nausea without vomiting.  Continued cough productive of yellow sputum.  Has some chronic dyspnea on exertion which he relates to his lung cancer.  Has right-sided chest pain where he states his lung cancer is.  No unilateral leg swelling, redness or warmth.  Has been too weak to get out of bed.  Denies Covid exposure.  No abdominal pain, dysuria.  Denies additional aggravating or alleviating factors.  No history of PE or DVT.  History obtained from patient and past medical records. No interpretor was used.  [facility] by Onc>>> Failed outpatient therapy 03/05/20 Rocephin, Azitho in ED>>> Changed to Vanc and Cefepime on admisison, dc on 03/08/2020. Sputum MRSA sensitive to Doxy which was started outpatient. Today is last dose.  Does have soft BP outpatient with Onc at 53'G systolic.  HPI     Past Medical History:  Diagnosis Date  . Allergic rhinitis, cause unspecified   . Anxiety state, unspecified   . Asthma    as a child  . Chronic airway obstruction, not elsewhere classified   . Esophageal reflux   . History of kidney stones   . Hypertension   . Lumbago   . Other and unspecified hyperlipidemia   . Other chest pain     Patient Active Problem List   Diagnosis Date Noted  . SOB (shortness of breath) 03/06/2020  . COPD with acute exacerbation ([place]) 03/06/2020  .  Generalized weakness 03/06/2020  . Acute hyponatremia 03/06/2020  . AKI (acute kidney injury) ([place]) 03/06/2020  . Dehydration 03/05/2020  . CAP (community acquired pneumonia) 03/05/2020  . Encounter for antineoplastic chemotherapy 01/30/2020  . Sinus congestion 01/22/2020  . Adenocarcinoma of right lung, stage 2 ([place]) 01/17/2020  . Pulmonary nodules/lesions, multiple 01/07/2020  . 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 12/21/2019  . Thoracic aortic atherosclerosis ([place]) 12/21/2019  . Erectile dysfunction 11/06/2019  . Fatigue 10/16/2019  . Insomnia 09/10/2019  . BPPV (benign paroxysmal positional vertigo) 08/03/2017  . Physical exam, annual 03/30/2015  . Essential hypertension 06/03/2014  . Overweight 12/25/2012  . Kidney stone 12/25/2012  . GERD 01/27/2010  . Hyperlipidemia 12/11/2009  . GAD (generalized anxiety disorder) 11/17/2007  . Anxiety state 11/17/2007  . Allergic rhinitis 02/26/2007  . 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[place]) 02/26/2007  . ABNORMAL CHEST XRAY 02/26/2007    Past Surgical History:  Procedure Laterality Date  . APPENDECTOMY    . BRONCHIAL BIOPSY  01/07/2020   Procedure: BRONCHIAL BIOPSIES;  Surgeon: Collene Gobble, MD;  Location: [facility] ENDOSCOPY;  Service: Pulmonary;;  . BRONCHIAL BRUSHINGS  01/07/2020   Procedure: BRONCHIAL BRUSHINGS;  Surgeon: Collene Gobble, MD;  Location: [facility] ENDOSCOPY;  Service: Pulmonary;;  . BRONCHIAL NEEDLE ASPIRATION BIOPSY  01/07/2020   Procedure: BRONCHIAL NEEDLE ASPIRATION BIOPSIES;  Surgeon: Collene Gobble, MD;  Location: [place] ENDOSCOPY;  Service: Pulmonary;;  .  BRONCHIAL WASHINGS  01/07/2020   Procedure: BRONCHIAL WASHINGS;  Surgeon: Collene Gobble, MD;  Location: [facility] ENDOSCOPY;  Service: Pulmonary;;  . COLONOSCOPY  15 years ago  . HEMOSTASIS CONTROL  01/07/2020   Procedure: HEMOSTASIS CONTROL;  Surgeon: Collene Gobble, MD;  Location: [facility] ENDOSCOPY;  Service: Pulmonary;;  cold saline  . NASAL  TURBINATE REDUCTION  2002   Dr.Crossley  . VASECTOMY    . VIDEO BRONCHOSCOPY WITH ENDOBRONCHIAL NAVIGATION N/A 01/07/2020   Procedure: VIDEO BRONCHOSCOPY WITH ENDOBRONCHIAL NAVIGATION;  Surgeon: Collene Gobble, MD;  Location: [place] ENDOSCOPY;  Service: Pulmonary;  Laterality: N/A;  . VIDEO BRONCHOSCOPY WITH ENDOBRONCHIAL ULTRASOUND N/A 01/07/2020   Procedure: VIDEO BRONCHOSCOPY WITH ENDOBRONCHIAL ULTRASOUND;  Surgeon: Collene Gobble, MD;  Location: [place] ENDOSCOPY;  Service: Pulmonary;  Laterality: N/A;       Family History  Problem Relation Age of Onset  . Breast cancer Mother   . Cancer Father   . Heart attack Brother   . Emphysema Maternal Uncle   . COPD Maternal Uncle   . Heart disease Maternal Uncle   . Colon cancer Neg Hx     Social History   Tobacco Use  . Smoking status: Former Smoker    Packs/day: 2.00    Years: 28.00    Pack years: 56.00    Types: Cigarettes    Quit date: 04/25/2005    Years since quitting: 14.8  . Smokeless tobacco: Never Used  Vaping Use  . Vaping Use: Never used  Substance Use Topics  . Alcohol use: Yes    Alcohol/week: 12.0 standard drinks    Types: 12 Cans of beer per week    Comment: social use  . Drug use: No    Home Medications Prior to Admission medications   Medication Sig Start Date End Date Taking? Authorizing Provider  acetaminophen (TYLENOL) 500 MG tablet Take 1,000 mg by mouth every 6 (six) hours as needed for mild pain.   Yes [provider]  Brimonidine Tartrate (LUMIFY OP) Place 1 drop into both eyes daily as needed (redness).   Yes [provider]  doxycycline (VIBRA-TABS) 100 MG tablet Take 1 tablet (100 mg total) by mouth every 12 (twelve) hours. 03/08/20  Yes Patrecia Pour, MD  fenofibrate 160 MG tablet Take 1 tablet (160 mg total) by mouth daily. 04/17/19  Yes [name], DO  folic acid (FOLVITE) 1 MG tablet Take 1 tablet (1 mg total) by mouth daily. 03/08/20  Yes Patrecia Pour, MD  guaiFENesin  (MUCINEX) 600 MG 12 hr tablet Take 600 mg by mouth daily as needed for cough.    Yes [provider]  HYDROcodone-homatropine (HYCODAN) 5-1.5 MG/5ML syrup TAKE 5 MLS BY MOUTH EVERY 6 HOURS AS NEEDED FOR COUGH Patient taking differently: Take 5 mLs by mouth every 6 (six) hours as needed for cough.  03/05/20  Yes Curt Bears, MD  Ipratropium-Albuterol (COMBIVENT RESPIMAT) 20-100 MCG/ACT AERS respimat Inhale 1-2 puffs into the lungs every 6 (six) hours as needed for wheezing. 01/22/20  Yes [name], DO  LORazepam (ATIVAN) 1 MG tablet TAKE 1 TABLET BY MOUTH THREE TIMES DAILY AS NEEDED FOR ANXIETY Patient taking differently: Take 1 mg by mouth in the morning, at noon, and at bedtime.  01/27/20  Yes [name], DO  losartan (COZAAR) 100 MG tablet Take 1 tablet (100 mg total) by mouth daily. 04/17/19  Yes [name], DO  potassium chloride SA (KLOR-CON) 20 MEQ tablet Take 1 tablet (20  mEq total) by mouth daily. 03/09/20  Yes Curt Bears, MD  traZODone (DESYREL) 50 MG tablet TAKE 1 TO 2 TABLETS BY MOUTH AT BEDTIME AS NEEDED FOR SLEEP Patient taking differently: Take 50 mg by mouth at bedtime.  01/28/20  Yes [name], DO  meclizine (ANTIVERT) 25 MG tablet Take 1 tablet (25 mg total) by mouth every 6 (six) hours as needed for dizziness. Patient not taking: Reported on 03/05/2020 01/07/20   Collene Gobble, MD  prochlorperazine (COMPAZINE) 10 MG tablet Take 1 tablet (10 mg total) by mouth every 6 (six) hours as needed for nausea or vomiting. Patient not taking: Reported on 03/05/2020 01/30/20   Curt Bears, MD  sildenafil (VIAGRA) 100 MG tablet Take 0.5-1 tablets (50-100 mg total) by mouth daily as needed for erectile dysfunction. Patient not taking: Reported on 03/05/2020 11/06/19   [name], DO  sucralfate (CARAFATE) 1 g tablet Take 1 tablet (1 g total) by mouth 4 (four) times daily -  with meals and at bedtime. Patient not taking: Reported on 03/05/2020 03/02/20    Curt Bears, MD    Allergies    Patient has no known allergies.  Review of Systems   Review of Systems  Constitutional: Positive for activity change, appetite change, chills, fatigue and fever.  HENT: Negative.   Respiratory: Positive for cough and shortness of breath.   Cardiovascular: Positive for chest pain (Right mid chest). Negative for palpitations and leg swelling.  Gastrointestinal: Positive for nausea. Negative for abdominal distention, abdominal pain, anal bleeding, blood in stool, constipation, diarrhea, rectal pain and vomiting.  Genitourinary: Negative.   Musculoskeletal: Negative.   Skin: Negative.   Neurological: Positive for weakness (Generalized) and light-headedness. Negative for dizziness, tremors, seizures, syncope, facial asymmetry, speech difficulty, numbness and headaches.  All other systems reviewed and are negative.   Physical Exam Updated Vital Signs BP 126/60 (BP Location: Right Arm)   Pulse 84   Temp 97.8 F (36.6 C) (Oral)   Resp (!) 21   SpO2 98%   Physical Exam Vitals and nursing note reviewed.  Constitutional:      General: He is not in acute distress.    Appearance: He is well-developed. He is obese. He is ill-appearing. He is not toxic-appearing or diaphoretic.     Comments: Pale appearing  HENT:     Head: Normocephalic and atraumatic.     Nose: Nose normal.     Mouth/Throat:     Mouth: Mucous membranes are dry.  Eyes:     Pupils: Pupils are equal, round, and reactive to light.  Cardiovascular:     Rate and Rhythm: Normal rate and regular rhythm.     Pulses: Normal pulses.     Heart sounds: Normal heart sounds.  Pulmonary:     Effort: Pulmonary effort is normal. No respiratory distress.     Breath sounds: Normal breath sounds.     Comments: Decreased lung sound right lobe. Speaks in full sentences without difficulty. Abdominal:     General: Bowel sounds are normal. There is no distension.     Palpations: Abdomen is soft.      Comments: Soft, non tender without rebound or guarding  Musculoskeletal:        General: No swelling, tenderness, deformity or signs of injury. Normal range of motion.     Cervical back: Normal range of motion and neck supple.     Right lower leg: No edema.     Left lower leg: No edema.  Comments: Moves all 4 extremities at difficulty.  No bony tenderness.  Compartments soft.  Bevelyn Buckles' sign negative.  Skin:    General: Skin is warm and dry.     Capillary Refill: Capillary refill takes 2 to 3 seconds.     Coloration: Skin is pale.  Neurological:     Mental Status: He is alert.     Comments: Mental Status:  Alert, oriented, thought content appropriate. Speech fluent without evidence of aphasia. Able to follow 2 step commands without difficulty.  Cranial Nerves:  II:  Peripheral visual fields grossly normal, pupils equal, round, reactive to light III,IV, VI: ptosis not present, extra-ocular motions intact bilaterally  V,VII: smile symmetric, facial light touch sensation equal VIII: hearing grossly normal bilaterally  IX,X: midline uvula rise  XI: bilateral shoulder shrug equal and strong XII: midline tongue extension  Motor:  5/5 in upper and lower extremities bilaterally including strong and equal grip strength and dorsiflexion/plantar flexion Sensory: Pinprick and light touch normal in all extremities.  Deep Tendon Reflexes: 2+ and symmetric  Cerebellar: normal finger-to-nose with bilateral upper extremities Gait: normal gait and balance CV: distal pulses palpable throughout       ED Results / Procedures / Treatments   Labs (all labs ordered are listed, but only abnormal results are displayed) Labs Reviewed  COMPREHENSIVE METABOLIC PANEL - Abnormal; Notable for the following components:      Result Value   Sodium 132 (*)    Potassium 2.5 (*)    Chloride 92 (*)    Glucose, Bld 111 (*)    Calcium 8.3 (*)    Total Protein 6.1 (*)    Albumin 2.5 (*)    AST 45 (*)    All  other components within normal limits  CBC WITH DIFFERENTIAL/PLATELET - Abnormal; Notable for the following components:   RBC 2.94 (*)    Hemoglobin 8.9 (*)    HCT 25.8 (*)    Lymphs Abs 0.5 (*)    All other components within normal limits  D-DIMER, QUANTITATIVE (NOT AT [facility]) - Abnormal; Notable for the following components:   D-Dimer, Quant 1.36 (*)    All other components within normal limits  RESP PANEL BY RT-PCR (FLU A&B, COVID) ARPGX2  CULTURE, BLOOD (ROUTINE X 2)  CULTURE, BLOOD (ROUTINE X 2)  URINE CULTURE  LACTIC ACID, PLASMA  PROTIME-INR  APTT  LACTIC ACID, PLASMA  URINALYSIS, ROUTINE W REFLEX MICROSCOPIC  MAGNESIUM  TROPONIN I (HIGH SENSITIVITY)  TROPONIN I (HIGH SENSITIVITY)    EKG EKG Interpretation  Date/Time:  Friday March 13 2020 11:12:42 EST Ventricular Rate:  85 PR Interval:    QRS Duration: 97 QT Interval:  389 QTC Calculation: 463 R Axis:   66 Text Interpretation: Sinus rhythm Atrial premature complex no STEMI, no sig change from previous Confirmed by Charlesetta Shanks [phone]) on 03/13/2020 12:46:01 PM   Radiology CT Angio Chest PE W/Cm &/Or Wo Cm  Result Date: 03/13/2020 CLINICAL DATA:  Fever. Hypotension. Anemia. Lung cancer. Ongoing radiation therapy and chemotherapy. EXAM: CT ANGIOGRAPHY CHEST WITH CONTRAST TECHNIQUE: Multidetector CT imaging of the chest was performed using the standard protocol during bolus administration of intravenous contrast. Multiplanar CT image reconstructions and MIPs were obtained to evaluate the vascular anatomy. CONTRAST:  33mL OMNIPAQUE IOHEXOL 350 MG/ML SOLN COMPARISON:  Chest radiograph from earlier today. 01/16/2020 PET-CT. 12/20/2019 chest CT. FINDINGS: Cardiovascular: The study is moderate quality for the evaluation of pulmonary embolism, with some motion degradation. There are no convincing filling defects  in the central, lobar, segmental or subsegmental pulmonary artery branches to suggest acute pulmonary embolism.  Atherosclerotic nonaneurysmal thoracic aorta. Stable top-normal caliber main pulmonary artery (3.0 cm diameter). Normal heart size. Stable trace pericardial effusion/thickening. Mediastinum/Nodes: No discrete thyroid nodules. Unremarkable esophagus. No axillary adenopathy. Mildly enlarged 1.2 cm subcarinal node (series 4/image 75), new. Mildly enlarged 1.0 cm right hilar node (series 4/image 71) new. No left hilar adenopathy. Lungs/Pleura: No pneumothorax. Small dependent bilateral pleural effusions, new bilaterally. Moderate centrilobular and paraseptal emphysema with diffuse bronchial wall thickening and saber sheath trachea. Extensive patchy consolidation and ground-glass opacity throughout the right lung involving all right lung lobes with associated air bronchograms, new since 01/16/2020 PET-CT, obscuring the margins of the cavitary right upper perihilar lung mass, which measures approximately 4.2 x 3.0 cm (series 10/image 58), not substantially changed since 01/16/2020 PET-CT. New patchy peribronchovascular irregular foci of consolidation and ground-glass opacity throughout left upper lobe. Stable solid 0.6 cm posterior left upper lobe pulmonary nodule (series 10/image 88). Upper abdomen: No acute abnormality. Musculoskeletal:  No aggressive appearing focal osseous lesions. Review of the MIP images confirms the above findings. IMPRESSION: 1. No evidence of acute pulmonary embolism. 2. Extensive patchy consolidation and ground-glass opacity throughout the right greater than left lungs, new since 01/16/2020 PET-CT. Findings favor multilobar pneumonia, with differential including drug reaction. A portion of the right lung opacities may represent early post treatment change. Close chest CT follow-up recommended. 3. Cavitary perihilar right upper lung mass is obscured and is grossly stable. Stable subcentimeter solid left lower lobe pulmonary nodule. 4. New small dependent bilateral pleural effusions. 5. New mild  subcarinal and right hilar lymphadenopathy, nonspecific, suggest attention on follow-up chest CT with IV contrast in 3 months. 6. Aortic Atherosclerosis (ICD10-I70.0) and Emphysema (ICD10-J43.9). Electronically Signed   By: Ilona Sorrel M.D.   On: 03/13/2020 14:26   DG Chest Port 1 View  Result Date: 03/13/2020 CLINICAL DATA:  Hypotension and fever.  Possible sepsis. EXAM: PORTABLE CHEST 1 VIEW COMPARISON:  Radiographs 03/05/2020 and 01/07/2020. CT 12/20/2019 and PET-CT 01/16/2020. FINDINGS: 1111 hours. The heart size and mediastinal contours are stable without gross adenopathy. There is progressive airspace disease throughout the right lung with mild volume loss. This obscures the previously demonstrated cavitary lesion in the right upper lobe. The left lung is clear. There is no pleural effusion or pneumothorax. No acute osseous findings are seen. IMPRESSION: Progressive right lung airspace disease suspicious for pneumonia in this clinical setting. In this patient with recently diagnosed lung cancer, these findings could relate to radiation therapy. Correlate clinically. Electronically Signed   By: Richardean Sale M.D.   On: 03/13/2020 11:43   Procedures .Critical Care Performed by: Nettie Elm, PA-C Authorized by: Nettie Elm, PA-C   Critical care provider statement:    Critical care time (minutes):  45   Critical care was necessary to treat or prevent imminent or life-threatening deterioration of the following conditions:  Sepsis   Critical care was time spent personally by me on the following activities:  Discussions with consultants, evaluation of patient's response to treatment, examination of patient, ordering and performing treatments and interventions, ordering and review of laboratory studies, ordering and review of radiographic studies, pulse oximetry, re-evaluation of patient's condition, obtaining history from patient or surrogate and review of old charts   (including  critical care time)  Medications Ordered in ED Medications  lactated ringers infusion ( Intravenous New Bag/Given 03/13/20 1436)  potassium chloride 10 mEq in 100 mL  IVPB (10 mEq Intravenous New Bag/Given 03/13/20 1425)  sodium chloride (PF) 0.9 % injection (has no administration in time range)  hydrocortisone sodium succinate (SOLU-CORTEF) 100 MG injection 100 mg (has no administration in time range)  lactated ringers bolus 2,000 mL (2,000 mLs Intravenous New Bag/Given 03/13/20 1121)  vancomycin (VANCOCIN) IVPB 1000 mg/200 mL premix (0 mg Intravenous Stopped 03/13/20 1429)  ceFEPIme (MAXIPIME) 2 g in sodium chloride 0.9 % 100 mL IVPB (0 g Intravenous Stopped 03/13/20 1159)  azithromycin (ZITHROMAX) 500 mg in sodium chloride 0.9 % 250 mL IVPB (0 mg Intravenous Stopped 03/13/20 1333)  potassium chloride SA (KLOR-CON) CR tablet 40 mEq (40 mEq Oral Given 03/13/20 1238)  magnesium sulfate IVPB 2 g 50 mL (0 g Intravenous Stopped 03/13/20 1429)  iohexol (OMNIPAQUE) 350 MG/ML injection 80 mL (80 mLs Intravenous Contrast Given 03/13/20 1326)  sodium chloride 0.9 % bolus 1,000 mL (1,000 mLs Intravenous New Bag/Given 03/13/20 1428)  acetaminophen (TYLENOL) tablet 650 mg (650 mg Oral Given 03/13/20 1416)    ED Course  I have reviewed the triage vital signs and the nursing notes.  Pertinent labs & imaging results that were available during my care of the patient were reviewed by me and considered in my medical decision making (see chart for details).  Current history patient getting chemotherapy for right lung cavitary lesion presents for evaluation of feeling unwell and persistent fevers at home.   Arrival patient hypotensive and tachypneic.  Has been running fevers up to 103 at home.  Recently discharged 4 days ago for outpatient failure CAP on Levaquin.  Still has some right-sided chest pain.  Neurovascularly intact.  Tactile temperature to extremities.  Does appear ill and has pallor.  Denies any  melena or bright blood per rectum.  Code sepsis called due to hypotension with tachypnea.  Patient given antibiotics for upper respiratory as well as IV fluids sent over from oncology due to worsening symptoms at home.  Will broaden antibiotics here given recent admission.  We will start with 2 L IV fluids given hypotension with maps less than 65.  Plan on labs, imaging and reassess  Labs and imaging personally reviewed and interpreted:  CBC without leukocytosis, hemoglobin 8.9 at baseline Metabolic panel with mild hyponatremia to 132, hypokalemia 2.5, glucose 111 magnesium added on.  Will supplement potassium as well as magnesium Trop  5 COVID negative Ddimer 1.36 INR 1.1 Lactic acid 1.9 DG chest with possible worsening right lower lobe infiltrate. Could also be seen in radiation to chest  Patient reassessed.  Blood pressures have improved.  Maps greater than 65, sepsis reassessed, lactic acid less than 4. Plan on trend blood pressure.  We will add on CTA chest given elevated D-dimer, known hypercoagulable state with cancer.  Patient reassessed.  Came back from CT scan.  Patient felt he felt "freezing".  Oral temp was 97.8 however patient hot to the touch.  Patient declines rectal temp.  Did have brief episode of tachycardia into the 180s with rigors.  This resolved by the time I reevaluated patient soon as I was notified.  He is normal sinus rhythm in the 80s.  His blood pressure is back down into the 90 systolic however his maps are greater than 65.  We will place additional order for IV fluids.  Pending CTA.  Reassessed.  Blood pressure back up to 126/60.  Discussed with attending.  Patient had recently been on antibiotics while inpatient for COPD exacerbation.  Will give stress dose of Solu-Cortef.  Patient has had normal mentation.  Do not see any additional skin findings to suggest cellulitis.  His CTA is negative for PE however he does have worsening diffuse multifocal pneumonia.  CONSULT  with Dr. Marylyn Ishihara with [facility] who will evaluate patient for admission.  The patient appears reasonably stabilized for admission considering the current resources, flow, and capabilities available in the ED at this time, and I doubt any other [facility] requiring further screening and/or treatment in the ED prior to admission.   Patient seen eval by attending, Dr. Johnney Killian who agrees with treatment, plan and disposition.    MDM Rules/Calculators/A&P                          Richard Li was evaluated in Emergency Department on 03/13/2020 for the symptoms described in the history of present illness. He was evaluated in the context of the global COVID-19 pandemic, which necessitated consideration that the patient might be at risk for infection with the SARS-CoV-2 virus that causes COVID-19. Institutional protocols and algorithms that pertain to the evaluation of patients at risk for COVID-19 are in a state of rapid change based on information released by regulatory bodies including the CDC and federal and state organizations. These policies and algorithms were followed during the patient's care in the ED.  Final Clinical Impression(s) / ED Diagnoses Final diagnoses:  Sepsis with acute organ dysfunction without septic shock, due to unspecified organism, unspecified type ([place])  Hypokalemia  Hypotension, unspecified hypotension type  Multifocal pneumonia  Malignant neoplasm of right lung, unspecified part of lung ([place])  Bilateral pleural effusion    Rx / DC Orders ED Discharge Orders    None       Denni France A, PA-C 03/13/20 1448    Charlesetta Shanks, MD 03/15/20 1228

## 2020-03-13 NOTE — Plan of Care (Signed)

## 2020-03-13 NOTE — ED Notes (Signed)
Pt ambulated to bathroom 

## 2020-03-13 NOTE — ED Notes (Signed)
Notified PA of episode of tachycardia in the 180's w/ shaking. Pt feels hot to touch but pt states he is "freezing". This nurse took another oral temp that was 97.8. Recommended rectal temp, but pt declined

## 2020-03-13 NOTE — Progress Notes (Signed)
A consult was received from an ED physician for cefepime and vancomycin per pharmacy dosing (for an indication other than meningitis). The patient's profile has been reviewed for ht/wt/allergies/indication/available labs. A one time order has been placed for the above antibiotics.  Further antibiotics/pharmacy consults should be ordered by admitting physician if indicated.                       Reuel Boom, PharmD, BCPS [phone] 03/13/2020, 10:50 AM

## 2020-03-13 NOTE — Progress Notes (Signed)
Pt arrived to unit at this time with his wife, noted.

## 2020-03-13 NOTE — Progress Notes (Signed)
Pharmacy Antibiotic Note  Richard Li is a 59 y.o. male admitted on 03/13/2020 with Hospital associated PNA.  Pharmacy has been consulted for vancomycin and cefepime dosing.  Plan:  Cefepime 2 gr IV q8h    Vancomycin 1000 mg IV x1 already given in ED, then vancomycin1500 mg IV q12h   Monitor clinical course, renal function, cultures as available       Temp (24hrs), Avg:97.9 F (36.6 C), Min:97.8 F (36.6 C), Max:98 F (36.7 C)  Recent Labs  Lab 03/07/20 0613 03/08/20 0543 03/09/20 0841 03/13/20 1043  WBC 3.2* 3.5* 3.7* 4.5  CREATININE 0.80 0.68 0.69 0.91  LATICACIDVEN  --   --   --  1.9    Estimated Creatinine Clearance: 101.6 mL/min (by C-G formula based on SCr of 0.91 mg/dL).    No Known Allergies  Antimicrobials this admission: 11/19 cefepime  >>  11/19 vancomycin >>   Dose adjustments this admission:   Microbiology results: 11/19 BCx:  11/19 UCx:  11/19 Legionella antigen:  11/19 Strep pneumoniae antigen:     Thank you for allowing pharmacy to be a part of this patient's care.   Royetta Asal, PharmD, BCPS 03/13/2020 6:01 PM

## 2020-03-13 NOTE — Telephone Encounter (Addendum)
Fever and shaking chills x 4 days  I called Richard Li re her email .She stated that Shahin has had daily fever 101 and 102 and shaking chills since Monday night.   I instructed her to get [place] to ED immediately - to call 911 .

## 2020-03-13 NOTE — ED Notes (Addendum)
Called report to Levada Dy, Therapist, sports on 4E

## 2020-03-13 NOTE — ED Triage Notes (Signed)
Pt presents with c/o hypotension and low hemoglobin. Pt reports he has lung cancer, unable to have chemo this week because of his labs. Pt appears fatigued during triage, BP in triage 78/53.

## 2020-03-13 NOTE — H&P (Signed)
History and Physical    Richard Li:[id number] DOB: 1960-11-21 DOA: 03/13/2020  PCP: [name], DO  Patient coming from: Home  Chief Complaint: cough  HPI: Richard Li is a 59 y.o. male with medical history significant of lung cancer. Presenting with worsening cough. He was recently in the hospital with presumed community acquired PNA. He was released to home on Sunday after improvement. He felt pretty good Sunday evening. However on Monday, he started noticing fatigue. This has worsened with his radiation treatments that he received from Monday through Wednesday. During this time, he was noticing fevers, chills and increasing cough. Tessalon pearls and mucinex were not helping. Family sent a message to his oncologist. It was recommended that he come to the ED.    ED Course: In the ED, he was noted to have PNA on CXR and CTA chest. He was started on broad spec abx, given fluids and solucortef for hypotension. He stabilized and [facility] was called for admission.   Review of Systems:  Denies CP, palpitations, N/V/D, ab pain. Review of systems is otherwise negative for all not mentioned in HPI.   PMHx Past Medical History:  Diagnosis Date  . Allergic rhinitis, cause unspecified   . Anxiety state, unspecified   . Asthma    as a child  . Chronic airway obstruction, not elsewhere classified   . Esophageal reflux   . History of kidney stones   . Hypertension   . Lumbago   . Other and unspecified hyperlipidemia   . Other chest pain     PSHx Past Surgical History:  Procedure Laterality Date  . APPENDECTOMY    . BRONCHIAL BIOPSY  01/07/2020   Procedure: BRONCHIAL BIOPSIES;  Surgeon: Collene Gobble, MD;  Location: [facility] ENDOSCOPY;  Service: Pulmonary;;  . BRONCHIAL BRUSHINGS  01/07/2020   Procedure: BRONCHIAL BRUSHINGS;  Surgeon: Collene Gobble, MD;  Location: [facility] ENDOSCOPY;  Service: Pulmonary;;  . BRONCHIAL NEEDLE ASPIRATION BIOPSY  01/07/2020   Procedure: BRONCHIAL NEEDLE  ASPIRATION BIOPSIES;  Surgeon: Collene Gobble, MD;  Location: [facility] ENDOSCOPY;  Service: Pulmonary;;  . BRONCHIAL WASHINGS  01/07/2020   Procedure: BRONCHIAL WASHINGS;  Surgeon: Collene Gobble, MD;  Location: [facility] ENDOSCOPY;  Service: Pulmonary;;  . COLONOSCOPY  15 years ago  . HEMOSTASIS CONTROL  01/07/2020   Procedure: HEMOSTASIS CONTROL;  Surgeon: Collene Gobble, MD;  Location: [facility] ENDOSCOPY;  Service: Pulmonary;;  cold saline  . NASAL TURBINATE REDUCTION  2002   Dr.Crossley  . VASECTOMY    . VIDEO BRONCHOSCOPY WITH ENDOBRONCHIAL NAVIGATION N/A 01/07/2020   Procedure: VIDEO BRONCHOSCOPY WITH ENDOBRONCHIAL NAVIGATION;  Surgeon: Collene Gobble, MD;  Location: [facility] ENDOSCOPY;  Service: Pulmonary;  Laterality: N/A;  . VIDEO BRONCHOSCOPY WITH ENDOBRONCHIAL ULTRASOUND N/A 01/07/2020   Procedure: VIDEO BRONCHOSCOPY WITH ENDOBRONCHIAL ULTRASOUND;  Surgeon: Collene Gobble, MD;  Location: [facility] ENDOSCOPY;  Service: Pulmonary;  Laterality: N/A;    SocHx  reports that he quit smoking about 14 years ago. His smoking use included cigarettes. He has a 56.00 pack-year smoking history. He has never used smokeless tobacco. He reports current alcohol use of about 12.0 standard drinks of alcohol per week. He reports that he does not use drugs.  No Known Allergies  FamHx Family History  Problem Relation Age of Onset  . Breast cancer Mother   . Cancer Father   . Heart attack Brother   . Emphysema Maternal Uncle   . COPD Maternal Uncle   . Heart disease  Maternal Uncle   . Colon cancer Neg Hx     Prior to Admission medications   Medication Sig Start Date End Date Taking? Authorizing Provider  acetaminophen (TYLENOL) 500 MG tablet Take 1,000 mg by mouth every 6 (six) hours as needed for mild pain.   Yes [provider]  Brimonidine Tartrate (LUMIFY OP) Place 1 drop into both eyes daily as needed (redness).   Yes [provider]  doxycycline (VIBRA-TABS) 100 MG tablet Take 1 tablet (100 mg  total) by mouth every 12 (twelve) hours. 03/08/20  Yes Patrecia Pour, MD  fenofibrate 160 MG tablet Take 1 tablet (160 mg total) by mouth daily. 04/17/19  Yes [name], DO  folic acid (FOLVITE) 1 MG tablet Take 1 tablet (1 mg total) by mouth daily. 03/08/20  Yes Patrecia Pour, MD  guaiFENesin (MUCINEX) 600 MG 12 hr tablet Take 600 mg by mouth daily as needed for cough.    Yes [provider]  HYDROcodone-homatropine (HYCODAN) 5-1.5 MG/5ML syrup TAKE 5 MLS BY MOUTH EVERY 6 HOURS AS NEEDED FOR COUGH Patient taking differently: Take 5 mLs by mouth every 6 (six) hours as needed for cough.  03/05/20  Yes Curt Bears, MD  Ipratropium-Albuterol (COMBIVENT RESPIMAT) 20-100 MCG/ACT AERS respimat Inhale 1-2 puffs into the lungs every 6 (six) hours as needed for wheezing. 01/22/20  Yes [name], DO  LORazepam (ATIVAN) 1 MG tablet TAKE 1 TABLET BY MOUTH THREE TIMES DAILY AS NEEDED FOR ANXIETY Patient taking differently: Take 1 mg by mouth in the morning, at noon, and at bedtime.  01/27/20  Yes [name], DO  losartan (COZAAR) 100 MG tablet Take 1 tablet (100 mg total) by mouth daily. 04/17/19  Yes [name], DO  potassium chloride SA (KLOR-CON) 20 MEQ tablet Take 1 tablet (20 mEq total) by mouth daily. 03/09/20  Yes Curt Bears, MD  traZODone (DESYREL) 50 MG tablet TAKE 1 TO 2 TABLETS BY MOUTH AT BEDTIME AS NEEDED FOR SLEEP Patient taking differently: Take 50 mg by mouth at bedtime.  01/28/20  Yes [name], DO  meclizine (ANTIVERT) 25 MG tablet Take 1 tablet (25 mg total) by mouth every 6 (six) hours as needed for dizziness. Patient not taking: Reported on 03/05/2020 01/07/20   Collene Gobble, MD  prochlorperazine (COMPAZINE) 10 MG tablet Take 1 tablet (10 mg total) by mouth every 6 (six) hours as needed for nausea or vomiting. Patient not taking: Reported on 03/05/2020 01/30/20   Curt Bears, MD  sildenafil (VIAGRA) 100 MG tablet Take 0.5-1 tablets (50-100 mg  total) by mouth daily as needed for erectile dysfunction. Patient not taking: Reported on 03/05/2020 11/06/19   [name], DO  sucralfate (CARAFATE) 1 g tablet Take 1 tablet (1 g total) by mouth 4 (four) times daily -  with meals and at bedtime. Patient not taking: Reported on 03/05/2020 03/02/20   Curt Bears, MD    Physical Exam: Vitals:   03/13/20 1300 03/13/20 1315 03/13/20 1400 03/13/20 1415  BP: 119/83 123/63 (!) 88/76 126/60  Pulse: 81 82 86 84  Resp:   (!) 22 (!) 21  Temp:   97.8 F (36.6 C)   TempSrc:   Oral   SpO2: 100% 100% 100% 98%    General: 59 y.o. male resting in bed in NAD Eyes: PERRL, normal sclera ENMT: Nares patent w/o discharge, orophaynx clear, dentition normal, ears w/o discharge/lesions/ulcers Neck: Supple, trachea midline Cardiovascular: RRR, +S1, S2, no m/g/r, equal pulses throughout Respiratory:  scattered rhonchi, normal WOB GI: BS+, NDNT, no masses noted, no organomegaly noted MSK: No e/c/c Skin: No rashes, bruises, ulcerations noted Neuro: A&O x 3, no focal deficits Psyc: Appropriate interaction and affect, calm/cooperative  Labs on Admission: I have personally reviewed following labs and imaging studies  CBC: Recent Labs  Lab 03/07/20 0613 03/08/20 0543 03/09/20 0841 03/13/20 1043  WBC 3.2* 3.5* 3.7* 4.5  NEUTROABS 2.7 2.7 2.8 3.3  HGB 8.9* 9.5* 9.2* 8.9*  HCT 25.3* 27.2* 26.0* 25.8*  MCV 87.2 87.5 85.8 87.8  PLT 126* 131* 128* 478   Basic Metabolic Panel: Recent Labs  Lab 03/07/20 0613 03/08/20 0543 03/09/20 0841 03/13/20 1043  NA 135 136 133* 132*  K 3.0* 3.3* 3.0* 2.5*  CL 101 101 97* 92*  CO2 24 27 29 29   GLUCOSE 139* 94 109* 111*  BUN 23* 19 14 11   CREATININE 0.80 0.68 0.69 0.91  CALCIUM 8.3* 8.2* 8.3* 8.3*  MG  --  1.7  --   --    GFR: Estimated Creatinine Clearance: 101.6 mL/min (by C-G formula based on SCr of 0.91 mg/dL). Liver Function Tests: Recent Labs  Lab 03/09/20 0841 03/13/20 1043  AST 27 45*   ALT 20 24  ALKPHOS 71 84  BILITOT 1.0 1.1  PROT 5.3* 6.1*  ALBUMIN 2.1* 2.5*   No results for input(s): LIPASE, AMYLASE in the last 168 hours. No results for input(s): AMMONIA in the last 168 hours. Coagulation Profile: Recent Labs  Lab 03/13/20 1043  INR 1.1   Cardiac Enzymes: No results for input(s): CKTOTAL, CKMB, CKMBINDEX, TROPONINI in the last 168 hours. BNP (last 3 results) No results for input(s): PROBNP in the last 8760 hours. HbA1C: No results for input(s): HGBA1C in the last 72 hours. CBG: No results for input(s): GLUCAP in the last 168 hours. Lipid Profile: No results for input(s): CHOL, HDL, LDLCALC, TRIG, CHOLHDL, LDLDIRECT in the last 72 hours. Thyroid Function Tests: No results for input(s): TSH, T4TOTAL, FREET4, T3FREE, THYROIDAB in the last 72 hours. Anemia Panel: No results for input(s): VITAMINB12, FOLATE, FERRITIN, TIBC, IRON, RETICCTPCT in the last 72 hours. Urine analysis:    Component Value Date/Time   COLORURINE YELLOW 03/13/2020 1400   APPEARANCEUR CLEAR 03/13/2020 1400   LABSPEC 1.005 03/13/2020 1400   PHURINE 6.0 03/13/2020 1400   GLUCOSEU NEGATIVE 03/13/2020 1400   GLUCOSEU NEGATIVE 12/12/2011 0944   HGBUR NEGATIVE 03/13/2020 1400   BILIRUBINUR NEGATIVE 03/13/2020 1400   KETONESUR NEGATIVE 03/13/2020 1400   PROTEINUR NEGATIVE 03/13/2020 1400   UROBILINOGEN 0.2 12/12/2011 0944   NITRITE NEGATIVE 03/13/2020 1400   LEUKOCYTESUR NEGATIVE 03/13/2020 1400    Radiological Exams on Admission: CT Angio Chest PE W/Cm &/Or Wo Cm  Result Date: 03/13/2020 CLINICAL DATA:  Fever. Hypotension. Anemia. Lung cancer. Ongoing radiation therapy and chemotherapy. EXAM: CT ANGIOGRAPHY CHEST WITH CONTRAST TECHNIQUE: Multidetector CT imaging of the chest was performed using the standard protocol during bolus administration of intravenous contrast. Multiplanar CT image reconstructions and MIPs were obtained to evaluate the vascular anatomy. CONTRAST:  4mL  OMNIPAQUE IOHEXOL 350 MG/ML SOLN COMPARISON:  Chest radiograph from earlier today. 01/16/2020 PET-CT. 12/20/2019 chest CT. FINDINGS: Cardiovascular: The study is moderate quality for the evaluation of pulmonary embolism, with some motion degradation. There are no convincing filling defects in the central, lobar, segmental or subsegmental pulmonary artery branches to suggest acute pulmonary embolism. Atherosclerotic nonaneurysmal thoracic aorta. Stable top-normal caliber main pulmonary artery (3.0 cm diameter). Normal heart size. Stable trace pericardial  effusion/thickening. Mediastinum/Nodes: No discrete thyroid nodules. Unremarkable esophagus. No axillary adenopathy. Mildly enlarged 1.2 cm subcarinal node (series 4/image 75), new. Mildly enlarged 1.0 cm right hilar node (series 4/image 71) new. No left hilar adenopathy. Lungs/Pleura: No pneumothorax. Small dependent bilateral pleural effusions, new bilaterally. Moderate centrilobular and paraseptal emphysema with diffuse bronchial wall thickening and saber sheath trachea. Extensive patchy consolidation and ground-glass opacity throughout the right lung involving all right lung lobes with associated air bronchograms, new since 01/16/2020 PET-CT, obscuring the margins of the cavitary right upper perihilar lung mass, which measures approximately 4.2 x 3.0 cm (series 10/image 58), not substantially changed since 01/16/2020 PET-CT. New patchy peribronchovascular irregular foci of consolidation and ground-glass opacity throughout left upper lobe. Stable solid 0.6 cm posterior left upper lobe pulmonary nodule (series 10/image 88). Upper abdomen: No acute abnormality. Musculoskeletal:  No aggressive appearing focal osseous lesions. Review of the MIP images confirms the above findings. IMPRESSION: 1. No evidence of acute pulmonary embolism. 2. Extensive patchy consolidation and ground-glass opacity throughout the right greater than left lungs, new since 01/16/2020 PET-CT.  Findings favor multilobar pneumonia, with differential including drug reaction. A portion of the right lung opacities may represent early post treatment change. Close chest CT follow-up recommended. 3. Cavitary perihilar right upper lung mass is obscured and is grossly stable. Stable subcentimeter solid left lower lobe pulmonary nodule. 4. New small dependent bilateral pleural effusions. 5. New mild subcarinal and right hilar lymphadenopathy, nonspecific, suggest attention on follow-up chest CT with IV contrast in 3 months. 6. Aortic Atherosclerosis (ICD10-I70.0) and Emphysema (ICD10-J43.9). Electronically Signed   By: Ilona Sorrel M.D.   On: 03/13/2020 14:26   DG Chest Port 1 View  Result Date: 03/13/2020 CLINICAL DATA:  Hypotension and fever.  Possible sepsis. EXAM: PORTABLE CHEST 1 VIEW COMPARISON:  Radiographs 03/05/2020 and 01/07/2020. CT 12/20/2019 and PET-CT 01/16/2020. FINDINGS: 1111 hours. The heart size and mediastinal contours are stable without gross adenopathy. There is progressive airspace disease throughout the right lung with mild volume loss. This obscures the previously demonstrated cavitary lesion in the right upper lobe. The left lung is clear. There is no pleural effusion or pneumothorax. No acute osseous findings are seen. IMPRESSION: Progressive right lung airspace disease suspicious for pneumonia in this clinical setting. In this patient with recently diagnosed lung cancer, these findings could relate to radiation therapy. Correlate clinically. Electronically Signed   By: Richardean Sale M.D.   On: 03/13/2020 11:43    EKG: Independently reviewed. Sinus, no st changes  Assessment/Plan Multifocal PNA Hospital associated PNA     - admit to inpatient, progressive     - vanc, cefepime, guafenesin, hycodan, flutter, nebs     - IVF, solucortef  Hypokalemia     - replace K+; Mg2+ is ok  Hx of lung cancer     - Follows w/ Dr. Earlie Server; notify CA team that he is here in AM (he is  on radiation and chemo)  Normocytic anemia     - no evidence of bleed; follow.   DVT prophylaxis: lovenox  Code Status: DNI confirmed at beside w/ pt/wife  Family Communication: with wife at bedside  Consults called: None  Status is: Inpatient  Remains inpatient appropriate because:Inpatient level of care appropriate due to severity of illness   Dispo: The patient is from: Home              Anticipated d/c is to: Home  Anticipated d/c date is: 2 days              Patient currently is not medically stable to d/c.  Jonnie Finner DO [facility]  If 7PM-7AM, please contact night-coverage [email or web address]  03/13/2020, 3:23 PM

## 2020-03-13 NOTE — Progress Notes (Signed)
Pt is alert and oriented times 3, stable and no c/o pain or SOB. Pt is RA. Pt Iv fluids is LR 132ml/hr.

## 2020-03-13 NOTE — Progress Notes (Signed)
Elink tracking Code Sepsis. Fluid resuscitation should be 2,823cc per protocol.

## 2020-03-14 DIAGNOSIS — J90 Pleural effusion, not elsewhere classified: Secondary | ICD-10-CM

## 2020-03-14 DIAGNOSIS — C3491 Malignant neoplasm of unspecified part of right bronchus or lung: Secondary | ICD-10-CM | POA: Diagnosis not present

## 2020-03-14 LAB — CBC
HCT: 25.5 % — ABNORMAL LOW (ref 39.0–52.0)
Hemoglobin: 8.7 g/dL — ABNORMAL LOW (ref 13.0–17.0)
MCH: 30.1 pg (ref 26.0–34.0)
MCHC: 34.1 g/dL (ref 30.0–36.0)
MCV: 88.2 fL (ref 80.0–100.0)
Platelets: 196 10*3/uL (ref 150–400)
RBC: 2.89 MIL/uL — ABNORMAL LOW (ref 4.22–5.81)
RDW: 13.1 % (ref 11.5–15.5)
WBC: 4.6 10*3/uL (ref 4.0–10.5)
nRBC: 0 % (ref 0.0–0.2)

## 2020-03-14 LAB — COMPREHENSIVE METABOLIC PANEL
ALT: 19 U/L (ref 0–44)
AST: 26 U/L (ref 15–41)
Albumin: 2 g/dL — ABNORMAL LOW (ref 3.5–5.0)
Alkaline Phosphatase: 69 U/L (ref 38–126)
Anion gap: 8 (ref 5–15)
BUN: 11 mg/dL (ref 6–20)
CO2: 27 mmol/L (ref 22–32)
Calcium: 7.5 mg/dL — ABNORMAL LOW (ref 8.9–10.3)
Chloride: 102 mmol/L (ref 98–111)
Creatinine, Ser: 0.6 mg/dL — ABNORMAL LOW (ref 0.61–1.24)
GFR, Estimated: >60 mL/min (ref >60)
Glucose, Bld: 130 mg/dL — ABNORMAL HIGH (ref 70–99)
Potassium: 3.5 mmol/L (ref 3.5–5.1)
Sodium: 137 mmol/L (ref 135–145)
Total Bilirubin: 1 mg/dL (ref 0.3–1.2)
Total Protein: 5 g/dL — ABNORMAL LOW (ref 6.5–8.1)

## 2020-03-14 LAB — STREP PNEUMONIAE URINARY ANTIGEN: Strep Pneumo Urinary Antigen: NEGATIVE

## 2020-03-14 NOTE — Plan of Care (Signed)

## 2020-03-14 NOTE — Progress Notes (Signed)
MD request diet to be change from NPO to regular diet at this time, noted.

## 2020-03-14 NOTE — Progress Notes (Signed)
PROGRESS NOTE    Richard Li  VAC:[id number] DOB: 09-02-1960 DOA: 03/13/2020 PCP: [name], DO    Brief Narrative:  59 y.o. male with medical history significant of lung cancer. Presenting with worsening cough. He was recently in the hospital with presumed community acquired PNA. He was released to home on Sunday after improvement. He felt pretty good Sunday evening. However on Monday, he started noticing fatigue. This has worsened with his radiation treatments that he received from Monday through Wednesday. During this time, he was noticing fevers, chills and increasing cough. Tessalon pearls and mucinex were not helping. Family sent a message to his oncologist. It was recommended that he come to the ED  Assessment & Plan:   Active Problems:   Sepsis (HCC)   1. Multifocal PNA with septic shock present on admit 1. Presented with sbp 78/53 with tachypnea present on admit 2. CXR reviewed, findings of progressive R lung airspace disease suspicious for PNA 3. Urine is neg for strep pneumo 4. Currently continued on vanc and cefepime 5. No leukocytosis 6. Will check CBC in AM 2. Hypokalemia 1. Replaced 2. Will repeat bmet in AM 3. Hx lung cancer 1. Followed by Dr. Mohamed and Rad Onc 2. Currently undergoing radiation tx 4. Normocytic anemia 1. Currently hemodynamically stable 2. hgb remains stable, slightly higher today at 8.7 from 8.1 yesterday 3. Will repeat CBC in AM  DVT prophylaxis: Lovenox subq Code Status: Partial (no intubation) Family Communication: Pt in room, family is at bedside  Status is: Inpatient  Remains inpatient appropriate because:IV treatments appropriate due to intensity of illness or inability to take PO and Inpatient level of care appropriate due to severity of illness   Dispo: The patient is from: Home              Anticipated d/c is to: Home              Anticipated d/c date is: 2 days              Patient currently is not medically stable  to d/c.   Consultants:     Procedures:     Antimicrobials: Anti-infectives (From admission, onward)   Start     Dose/Rate Route Frequency Ordered Stop   03/13/20 2200  vancomycin (VANCOREADY) IVPB 1500 mg/300 mL        1,500 mg 150 mL/hr over 120 Minutes Intravenous Every 12 hours 03/13/20 1801     03/13/20 2000  ceFEPIme (MAXIPIME) 2 g in sodium chloride 0.9 % 100 mL IVPB        2 g 200 mL/hr over 30 Minutes Intravenous Every 8 hours 03/13/20 1801     03/13/20 1100  vancomycin (VANCOCIN) IVPB 1000 mg/200 mL premix        1,000 mg 200 mL/hr over 60 Minutes Intravenous  Once 03/13/20 1045 03/13/20 1429   03/13/20 1100  ceFEPIme (MAXIPIME) 2 g in sodium chloride 0.9 % 100 mL IVPB        2 g 200 mL/hr over 30 Minutes Intravenous  Once 03/13/20 1045 03/13/20 1159   03/13/20 1100  azithromycin (ZITHROMAX) 500 mg in sodium chloride 0.9 % 250 mL IVPB        50 0 mg 250 mL/hr over 60 Minutes Intravenous  Once 03/13/20 1045 03/13/20 1333       Subjective: Without complaints this AM. Denies sob  Objective: Vitals:   03/14/20 0154 03/14/20 0549 03/14/20 1001 03/14/20 1324  BP: (!) 127/91 125/68 129/70 [name])  104/55  Pulse: 68 69 77 85  Resp: 20  16 16   Temp: (!) 97.4 F (36.3 C) 97.7 F (36.5 C) 97.6 F (36.4 C) 97.6 F (36.4 C)  TempSrc: Oral Oral Oral Oral  SpO2: 97% 96% 97% 96%  Weight:      Height:        Intake/Output Summary (Last 24 hours) at 03/14/2020 1716 Last data filed at 03/14/2020 1100 Gross per 24 hour  Intake 1431.54 ml  Output --  Net 1431.54 ml   Filed Weights   03/13/20 1857  Weight: 94.1 kg    Examination:  General exam: Appears calm and comfortable  Respiratory system: Clear to auscultation. Respiratory effort normal. Cardiovascular system: S1 & S2 heard, Regular Gastrointestinal system: Abdomen is nondistended, soft and nontender. No organomegaly or masses felt. Normal bowel sounds heard. Central nervous system: Alert and oriented. No focal  neurological deficits. Extremities: Symmetric 5 x 5 power. Skin: No rashes, lesions  Psychiatry: Judgement and insight appear normal. Mood & affect appropriate.   Data Reviewed: I have personally reviewed following labs and imaging studies  CBC: Recent Labs  Lab 03/08/20 0543 03/09/20 0841 03/13/20 1043 03/13/20 1923 03/14/20 0846  WBC 3.5* 3.7* 4.5 4.8 4.6  NEUTROABS 2.7 2.8 3.3  --   --   HGB 9.5* 9.2* 8.9* 8.1* 8.7*  HCT 27.2* 26.0* 25.8* 23.2* 25.5*  MCV 87.5 85.8 87.8 87.9 88.2  PLT 131* 128* 191 188 202   Basic Metabolic Panel: Recent Labs  Lab 03/08/20 0543 03/09/20 0841 03/13/20 1043 03/13/20 1445 03/13/20 1923 03/14/20 0846  NA 136 133* 132*  --   --  137  K 3.3* 3.0* 2.5*  --   --  3.5  CL 101 97* 92*  --   --  102  CO2 27 29 29   --   --  27  GLUCOSE 94 109* 111*  --   --  130*  BUN 19 14 11   --   --  11  CREATININE 0.68 0.69 0.91  --  0.60* 0.60*  CALCIUM 8.2* 8.3* 8.3*  --   --  7.5*  MG 1.7  --   --  1.8  --   --    GFR: Estimated Creatinine Clearance: 115.6 mL/min (A) (by C-G formula based on SCr of 0.6 mg/dL (L)). Liver Function Tests: Recent Labs  Lab 03/09/20 0841 03/13/20 1043 03/14/20 0846  AST 27 45* 26  ALT 20 24 19   ALKPHOS 71 84 69  BILITOT 1.0 1.1 1.0  PROT 5.3* 6.1* 5.0*  ALBUMIN 2.1* 2.5* 2.0*   No results for input(s): LIPASE, AMYLASE in the last 168 hours. No results for input(s): AMMONIA in the last 168 hours. Coagulation Profile: Recent Labs  Lab 03/13/20 1043  INR 1.1   Cardiac Enzymes: No results for input(s): CKTOTAL, CKMB, CKMBINDEX, TROPONINI in the last 168 hours. BNP (last 3 results) No results for input(s): PROBNP in the last 8760 hours. HbA1C: No results for input(s): HGBA1C in the last 72 hours. CBG: No results for input(s): GLUCAP in the last 168 hours. Lipid Profile: No results for input(s): CHOL, HDL, LDLCALC, TRIG, CHOLHDL, LDLDIRECT in the last 72 hours. Thyroid Function Tests: No results for  input(s): TSH, T4TOTAL, FREET4, T3FREE, THYROIDAB in the last 72 hours. Anemia Panel: No results for input(s): VITAMINB12, FOLATE, FERRITIN, TIBC, IRON, RETICCTPCT in the last 72 hours. Sepsis Labs: Recent Labs  Lab 03/13/20 1043  LATICACIDVEN 1.9    Recent Results (from  the past 240 hour(s))  Blood Culture (routine x 2)     Status: None   Collection Time: 03/05/20  5:08 PM   Specimen: BLOOD  Result Value Ref Range Status   Specimen Description   Final    BLOOD LEFT ANTECUBITAL Performed at [facility] [address] [place], [place] [address]    Special Requests   Final    BOTTLES DRAWN AEROBIC AND ANAEROBIC Blood Culture adequate volume Performed at [facility] [address] [place], [place] [address]    Culture   Final    NO GROWTH 5 DAYS Performed at [facility], [place] [address] [place], [place] [address]    Report Status 03/10/2020 FINAL  Final  Blood Culture (routine x 2)     Status: None   Collection Time: 03/05/20  5:08 PM   Specimen: BLOOD RIGHT FOREARM  Result Value Ref Range Status   Specimen Description   Final    BLOOD RIGHT FOREARM Performed at [facility] [address], [place], [place] [address]    Special Requests   Final    BOTTLES DRAWN AEROBIC AND ANAEROBIC Blood Culture adequate volume Performed at [facility] [address] [place], [place] [address]    Culture   Final    NO GROWTH 5 DAYS Performed at [facility], [place] [address] [place], [place] [address]    Report Status 03/10/2020 FINAL  Final  Urine culture     Status: Abnormal   Collection Time: 03/05/20  6:20 PM   Specimen: In/Out Cath Urine  Result Value Ref Range Status   Specimen Description   Final    IN/OUT CATH URINE Performed at [facility] [address], [place], [place] [address]    Special Requests   Final    NONE Performed at [facility], [place] [address], [place], [place] [address]    Culture 7,000 COLONIES/mL ENTEROCOCCUS FAECALIS (A)  Final   Report Status 03/08/2020 FINAL  Final   Organism ID, Bacteria ENTEROCOCCUS FAECALIS (A)  Final      Susceptibility   Enterococcus faecalis - MIC*    AMPICILLIN <=2 SENSITIVE Sensitive     NITROFURANTOIN <=16 SENSITIVE Sensitive     VANCOMYCIN 2 SENSITIVE Sensitive     * 7,000 COLONIES/mL ENTEROCOCCUS FAECALIS  Respiratory Panel by RT PCR (Flu A&B, Covid) - Nasopharyngeal Swab     Status: None   Collection Time: 03/05/20  7:32 PM   Specimen: Nasopharyngeal Swab  Result Value Ref Range Status   SARS Coronavirus 2 by RT PCR NEGATIVE NEGATIVE Final    Comment: (NOTE) SARS-CoV-2 target nucleic acids are NOT DETECTED.  The SARS-CoV-2 RNA is generally detectable in upper respiratoy specimens during the acute phase of infection. The lowest concentration of SARS-CoV-2 viral copies this assay can detect is 131 copies/mL. A negative result does not preclude SARS-Cov-2 infection and should not be used as the sole basis for treatment or other patient management decisions. A negative result may occur with  improper specimen collection/handling, submission of specimen other than nasopharyngeal swab, presence of viral mutation(s) within the areas targeted by this assay, and inadequate number of viral copies (<131 copies/mL). A negative result must be combined with clinical observations, patient history, and epidemiological information. The expected result is Negative.  Fact Sheet for Patients:  PinkCheek.be  Fact Sheet for Healthcare Providers:  GravelBags.it  This test is no  t yet approved or cleared by the [place] and  has been authorized for detection and/or diagnosis of SARS-CoV-2 by FDA under an Emergency Use Authorization (EUA). This EUA will remain  in effect (meaning this test can be used) for the  duration of the COVID-19 declaration under Section 564(b)(1) of the Act, 21 U.S.C. section 360bbb-3(b)(1), unless the authorization is terminated or revoked sooner.     Influenza A by PCR NEGATIVE NEGATIVE Final   Influenza B by PCR NEGATIVE NEGATIVE Final    Comment: (NOTE) The Xpert Xpress SARS-CoV-2/FLU/RSV assay is intended as an aid in  the diagnosis of influenza from Nasopharyngeal swab specimens and  should not be used as a sole basis for treatment. Nasal washings and  aspirates are unacceptable for Xpert Xpress SARS-CoV-2/FLU/RSV  testing.  Fact Sheet for Patients: PinkCheek.be  Fact Sheet for Healthcare Providers: GravelBags.it  This test is not yet approved or cleared by the [place] FDA and  has been authorized for detection and/or diagnosis of SARS-CoV-2 by  FDA under an Emergency Use Authorization (EUA). This EUA will remain  in effect (meaning this test can be used) for the duration of the  Covid-19 declaration under Section 564(b)(1) of the Act, 21  U.S.C. section 360bbb-3(b)(1), unless the authorization is  terminated or revoked. Performed at [facility], [place] [address] [place], [place] [address]   MRSA PCR Screening     Status: Abnormal   Collection Time: 03/06/20  6:51 AM   Specimen: Nasopharyngeal  Result Value Ref Range Status   MRSA by PCR POSITIVE (A) NEGATIVE Final    Comment:        The GeneXpert MRSA Assay (FDA approved for NASAL specimens only), is one component of a comprehensive MRSA colonization surveillance program. It is not intended to diagnose MRSA infection nor to guide or monitor treatment for MRSA infections. RESULT CALLED TO, READ BACK BY AND VERIFIED WITH: A,BANO AT 1000 ON 03/06/20 BY A,MOHAMED Performed at [facility], [place] [address], [place], [place] [address]   Expectorated sputum assessment w rflx to resp cult     Status:  None   Collection Time: 03/06/20  6:58 AM   Specimen: Expectorated Sputum  Result Value Ref Range Status   Specimen Description EXPECTORATED SPUTUM  Final   Special Requests NONE  Final   Sputum evaluation   Final    THIS SPECIMEN IS ACCEPTABLE FOR SPUTUM CULTURE Performed at [facility], [place] [address] [place], [place] [address]    Report Status 03/06/2020 FINAL  Final  Culture, respiratory     Status: None   Collection Time: 03/06/20  6:58 AM  Result Value Ref Range Status   Specimen Description   Final    EXPECTORATED SPUTUM Performed at [facility] [address] [place], [place] [address]    Special Requests   Final    NONE Reflexed from [id number] Performed at [facility], [place] [address], [place], [place] [address]    Gram Stain   Final    NO WBC SEEN RARE GRAM POSITIVE COCCI IN PAIRS RARE GRAM VARIABLE ROD Performed at [facility], [place] [address] [place], [place] [address]    Culture FEW METHICILLIN RESISTANT STAPHYLOCOCCUS AUREUS  Final   Report Status 03/08/2020 FINAL  Final   Organism ID, Bacteria METHICILLIN RESISTANT STAPHYLOCOCCUS AUREUS  Final      Susceptibility   Methicillin resistant staphylococcus aureus - MIC*  CIPROFLOXACIN >=8 RESISTANT Resistant     ERYTHROMYCIN <=0.25 SENSITIVE Sensitive     GENTAMICIN <=0.5 SENSITIVE Sensitive     OXACILLIN >=4 RESISTANT Resistant     TETRACYCLINE <=1 SENSITIVE Sensitive     VANCOMYCIN 1 SENSITIVE Sensitive     TRIMETH/SULFA <=10 SENSITIVE Sensitive     CLINDAMYCIN <=0.25 SENSITIVE Sensitive     RIFAMPIN <=0.5 SENSITIVE Sensitive     Inducible Clindamycin NEGATIVE Sensitive     * FEW METHICILLIN RESISTANT STAPHYLOCOCCUS AUREUS  Blood Culture (routine x 2)     Status: None (Preliminary result)   Collection Time: 03/13/20 10:43 AM   Specimen: BLOOD  Result Value Ref Range Status   Specimen Description   Final    BLOOD LEFT ARM Performed at  [facility] [address], [place], [place] [address]    Special Requests   Final    BOTTLES DRAWN AEROBIC AND ANAEROBIC Blood Culture results may not be optimal due to an inadequate volume of blood received in culture bottles Performed at [facility] [address], [place], [place] [address]    Culture   Final    NO GROWTH < 24 HOURS Performed at [facility] [address] [place], [place] [address]    Report Status PENDING  Incomplete  Blood Culture (routine x 2)     Status: None (Preliminary result)   Collection Time: 03/13/20 10:48 AM   Specimen: BLOOD  Result Value Ref Range Status   Specimen Description   Final    BLOOD LEFT ARM Performed at [facility] [address], [place], [place] [address]    Special Requests   Final    BOTTLES DRAWN AEROBIC AND ANAEROBIC Blood Culture adequate volume Performed at [facility] [address], [place], [place] [address]    Culture   Final    NO GROWTH < 24 HOURS Performed at [facility] [address] [place], [place] [address]    Report Status PENDING  Incomplete  Resp Panel by RT-PCR (Flu A&B, Covid) Nasopharyngeal Swab     Status: None   Collection Time: 03/13/20 11:08 AM   Specimen: Nasopharyngeal Swab; Nasopharyngeal(NP) swabs in vial transport medium  Result Value Ref Range Status   SARS Coronavirus 2 by RT PCR NEGATIVE NEGATIVE Final    Comment: (NOTE) SARS-CoV-2 target nucleic acids are NOT DETECTED.  The SARS-CoV-2 RNA is generally detectable in upper respiratory specimens during the acute phase of infection. The lowest concentration of SARS-CoV-2 viral copies this assay can detect is 138 copies/mL. A negative result does not preclude SARS-Cov-2 infection and should not be used as the sole basis for treatment or other patient management decisions. A negative result may occur with  improper specimen collection/handling,  submission of specimen other than nasopharyngeal swab, presence of viral mutation(s) within the areas targeted by this assay, and inadequate number of viral copies(<138 copies/mL). A negative result must be combined with clinical observations, patient history, and epidemiological information. The expected result is Negative.  Fact Sheet for Patients:  EntrepreneurPulse.com.au  Fact Sheet for Healthcare Providers:  IncredibleEmployment.be  This test is no t yet approved or cleared by the [place] FDA and  has been authorized for detection and/or diagnosis of SARS-CoV-2 by FDA under an Emergency Use Authorization (EUA). This EUA will remain  in effect (meaning this test can be used) for the duration of the COVID-19 declaration under Section 564(b)(1) of the  Act, 21 U.S.C.section 360bbb-3(b)(1), unless the authorization is terminated  or revoked sooner.       Influenza A by PCR NEGATIVE NEGATIVE Final   Influenza B by PCR NEGATIVE NEGATIVE Final    Comment: (NOTE) The Xpert Xpress SARS-CoV-2/FLU/RSV plus assay is intended as an aid in the diagnosis of influenza from Nasopharyngeal swab specimens and should not be used as a sole basis for treatment. Nasal washings and aspirates are unacceptable for Xpert Xpress SARS-CoV-2/FLU/RSV testing.  Fact Sheet for Patients: EntrepreneurPulse.com.au  Fact Sheet for Healthcare Providers: IncredibleEmployment.be  This test is not yet approved or cleared by the [place] FDA and has been authorized for detection and/or diagnosis of SARS-CoV-2 by FDA under an Emergency Use Authorization (EUA). This EUA will remain in effect (meaning this test can be used) for the duration of the COVID-19 declaration under Section 564(b)(1) of the Act, 21 U.S.C. section 360bbb-3(b)(1), unless the authorization is terminated or revoked.  Performed at [facility], [place] [address] [place], [place] [address]      Radiology Studies: CT Angio Chest PE W/Cm &/Or Wo Cm  Result Date: 03/13/2020 CLINICAL DATA:  Fever. Hypotension. Anemia. Lung cancer. Ongoing radiation therapy and chemotherapy. EXAM: CT ANGIOGRAPHY CHEST WITH CONTRAST TECHNIQUE: Multidetector CT imaging of the chest was performed using the standard protocol during bolus administration of intravenous contrast. Multiplanar CT image reconstructions and MIPs were obtained to evaluate the vascular anatomy. CONTRAST:  61mL OMNIPAQUE IOHEXOL 350 MG/ML SOLN COMPARISON:  Chest radiograph from earlier today. 01/16/2020 PET-CT. 12/20/2019 chest CT. FINDINGS: Cardiovascular: The study is moderate quality for the evaluation of pulmonary embolism, with some motion degradation. There are no convincing filling defects in the central, lobar, segmental or subsegmental pulmonary artery branches to suggest acute pulmonary embolism. Atherosclerotic nonaneurysmal thoracic aorta. Stable top-normal caliber main pulmonary artery (3.0 cm diameter). Normal heart size. Stable trace pericardial effusion/thickening. Mediastinum/Nodes: No discrete thyroid nodules. Unremarkable esophagus. No axillary adenopathy. Mildly enlarged 1.2 cm subcarinal node (series 4/image 75), new. Mildly enlarged 1.0 cm right hilar node (series 4/image 71) new. No left hilar adenopathy. Lungs/Pleura: No pneumothorax. Small dependent bilateral pleural effusions, new bilaterally. Moderate centrilobular and paraseptal emphysema with diffuse bronchial wall thickening and saber sheath trachea. Extensive patchy consolidation and ground-glass opacity throughout the right lung involving all right lung lobes with associated air bronchograms, new since 01/16/2020 PET-CT, obscuring the margins of the cavitary right upper perihilar lung mass, which measures approximately 4.2 x 3.0 cm (series 10/image 58), not substantially changed since 01/16/2020 PET-CT. New  patchy peribronchovascular irregular foci of consolidation and ground-glass opacity throughout left upper lobe. Stable solid 0.6 cm posterior left upper lobe pulmonary nodule (series 10/image 88). Upper abdomen: No acute abnormality. Musculoskeletal:  No aggressive appearing focal osseous lesions. Review of the MIP images confirms the above findings. IMPRESSION: 1. No evidence of acute pulmonary embolism. 2. Extensive patchy consolidation and ground-glass opacity throughout the right greater than left lungs, new since 01/16/2020 PET-CT. Findings favor multilobar pneumonia, with differential including drug reaction. A portion of the right lung opacities may represent early post treatment change. Close chest CT follow-up recommended. 3. Cavitary perihilar right upper lung mass is obscured and is grossly stable. Stable subcentimeter solid left lower lobe pulmonary nodule. 4. New small dependent bilateral pleural effusions. 5. New mild subcarinal and right hilar lymphadenopathy, nonspecific, suggest attention on follow-up chest CT with IV contrast in 3 months. 6. Aortic Atherosclerosis (ICD10-I70.0) and Emphysema (ICD10-J43.9). Electronically Signed   By: Corene Cornea  A Poff M.D.   On: 03/13/2020 14:26   DG Chest Port 1 View  Result Date: 03/13/2020 CLINICAL DATA:  Hypotension and fever.  Possible sepsis. EXAM: PORTABLE CHEST 1 VIEW COMPARISON:  Radiographs 03/05/2020 and 01/07/2020. CT 12/20/2019 and PET-CT 01/16/2020. FINDINGS: 1111 hours. The heart size and mediastinal contours are stable without gross adenopathy. There is progressive airspace disease throughout the right lung with mild volume loss. This obscures the previously demonstrated cavitary lesion in the right upper lobe. The left lung is clear. There is no pleural effusion or pneumothorax. No acute osseous findings are seen. IMPRESSION: Progressive right lung airspace disease suspicious for pneumonia in this clinical setting. In this patient with recently  diagnosed lung cancer, these findings could relate to radiation therapy. Correlate clinically. Electronically Signed   By: Richardean Sale M.D.   On: 03/13/2020 11:43    Scheduled Meds: . enoxaparin (LOVENOX) injection  40 mg Subcutaneous Q24H  . fenofibrate  160 mg Oral Daily  . folic acid  1 mg Oral Daily  . guaiFENesin  600 mg Oral BID  . hydrocortisone sod succinate (SOLU-CORTEF) inj  50 mg Intravenous Q8H   Continuous Infusions: . sodium chloride 100 mL/hr at 03/14/20 1113  . ceFEPime (MAXIPIME) IV 2 g (03/14/20 1148)  . vancomycin 1,500 mg (03/14/20 0912)     LOS: 1 day   Marylu Lund, MD [facility] Pager On Amion  If 7PM-7AM, please contact night-coverage 03/14/2020, 5:16 PM

## 2020-03-15 ENCOUNTER — Ambulatory Visit: Payer: BC Managed Care – PPO

## 2020-03-15 DIAGNOSIS — A419 Sepsis, unspecified organism: Principal | ICD-10-CM

## 2020-03-15 DIAGNOSIS — G4700 Insomnia, unspecified: Secondary | ICD-10-CM | POA: Diagnosis not present

## 2020-03-15 DIAGNOSIS — F419 Anxiety disorder, unspecified: Secondary | ICD-10-CM

## 2020-03-15 DIAGNOSIS — M7989 Other specified soft tissue disorders: Secondary | ICD-10-CM

## 2020-03-15 DIAGNOSIS — E876 Hypokalemia: Secondary | ICD-10-CM

## 2020-03-15 DIAGNOSIS — R5381 Other malaise: Secondary | ICD-10-CM

## 2020-03-15 DIAGNOSIS — R6521 Severe sepsis with septic shock: Secondary | ICD-10-CM

## 2020-03-15 LAB — COMPREHENSIVE METABOLIC PANEL
ALT: 24 U/L (ref 0–44)
AST: 41 U/L (ref 15–41)
Albumin: 1.8 g/dL — ABNORMAL LOW (ref 3.5–5.0)
Alkaline Phosphatase: 62 U/L (ref 38–126)
Anion gap: 9 (ref 5–15)
BUN: 12 mg/dL (ref 6–20)
CO2: 25 mmol/L (ref 22–32)
Calcium: 7.4 mg/dL — ABNORMAL LOW (ref 8.9–10.3)
Chloride: 103 mmol/L (ref 98–111)
Creatinine, Ser: 0.57 mg/dL — ABNORMAL LOW (ref 0.61–1.24)
GFR, Estimated: >60 mL/min (ref >60)
Glucose, Bld: 132 mg/dL — ABNORMAL HIGH (ref 70–99)
Potassium: 3.1 mmol/L — ABNORMAL LOW (ref 3.5–5.1)
Sodium: 137 mmol/L (ref 135–145)
Total Bilirubin: 0.5 mg/dL (ref 0.3–1.2)
Total Protein: 4.6 g/dL — ABNORMAL LOW (ref 6.5–8.1)

## 2020-03-15 LAB — CBC
HCT: 23.1 % — ABNORMAL LOW (ref 39.0–52.0)
Hemoglobin: 8 g/dL — ABNORMAL LOW (ref 13.0–17.0)
MCH: 30.4 pg (ref 26.0–34.0)
MCHC: 34.6 g/dL (ref 30.0–36.0)
MCV: 87.8 fL (ref 80.0–100.0)
Platelets: 219 10*3/uL (ref 150–400)
RBC: 2.63 MIL/uL — ABNORMAL LOW (ref 4.22–5.81)
RDW: 13.1 % (ref 11.5–15.5)
WBC: 6.5 10*3/uL (ref 4.0–10.5)
nRBC: 0 % (ref 0.0–0.2)

## 2020-03-15 LAB — MRSA PCR SCREENING: MRSA by PCR: NEGATIVE

## 2020-03-15 LAB — URINE CULTURE: Culture: NO GROWTH

## 2020-03-15 LAB — MAGNESIUM: Magnesium: 1.6 mg/dL — ABNORMAL LOW (ref 1.7–2.4)

## 2020-03-15 MED ORDER — MAGNESIUM SULFATE 2 GM/50ML IV SOLN
2.0000 g | Freq: Once | INTRAVENOUS | Status: AC
  Administered 2020-03-15: 2 g via INTRAVENOUS
  Filled 2020-03-15: qty 50

## 2020-03-15 MED ORDER — TRAZODONE HCL 50 MG PO TABS
50.0000 mg | ORAL_TABLET | Freq: Every day | ORAL | Status: DC
  Administered 2020-03-15 – 2020-03-16 (×2): 50 mg via ORAL
  Filled 2020-03-15 (×2): qty 1

## 2020-03-15 MED ORDER — POTASSIUM CHLORIDE CRYS ER 20 MEQ PO TBCR
40.0000 meq | EXTENDED_RELEASE_TABLET | ORAL | Status: AC
  Administered 2020-03-15 (×2): 40 meq via ORAL
  Filled 2020-03-15 (×2): qty 2

## 2020-03-15 MED ORDER — HYDROCORTISONE NA SUCCINATE PF 100 MG IJ SOLR
50.0000 mg | Freq: Two times a day (BID) | INTRAMUSCULAR | Status: AC
  Administered 2020-03-15 – 2020-03-16 (×2): 50 mg via INTRAVENOUS
  Filled 2020-03-15 (×2): qty 2

## 2020-03-15 NOTE — Progress Notes (Signed)
MRSA PCR negative, pt updated per request. SRP, RN

## 2020-03-15 NOTE — Plan of Care (Signed)

## 2020-03-15 NOTE — Progress Notes (Signed)
PROGRESS NOTE  Richard Li ZGY:[id number] DOB: [birth year]   PCP: [name], DO  Patient is from: Home  DOA: 03/13/2020 LOS: 2  Chief complaints: Cough  Brief Narrative / Interim history: 59 year old male with history of lung cancer on radiation therapy and recent hospitalization for presenting with fever, chills, increased cough and fatigue and admitted for septic shock due to multifocal pneumonia.  CXR revealed progressive right lung airspace disease. CTA chest negative for PE but extensive patchy consolidation and GG opacity bilaterally, and stable looking cavitary perihilar RUL mass.  Started on cefepime, vancomycin and azithromycin.  Blood cultures NGTD.  Subjective: Seen and examined earlier this morning.  No major events overnight or this morning.  Reports productive cough with whitish phlegm but denies hemoptysis or chest pain except when he is coughing.  Denies GI or UTI symptoms.  He reports swelling in his arms.  Denies orthopnea or PND.  Wife at bedside.  Objective: Vitals:   03/14/20 1324 03/14/20 1731 03/14/20 2126 03/15/20 0457  BP: (!) 104/55 112/62 112/67 127/64  Pulse: 85 87 74 78  Resp: 16 16 18 18   Temp: 97.6 F (36.4 C) 97.7 F (36.5 C) (!) 97.5 F (36.4 C) (!) 97.4 F (36.3 C)  TempSrc: Oral Oral Oral Oral  SpO2: 96% 97% 95% 97%  Weight:      Height:        Intake/Output Summary (Last 24 hours) at 03/15/2020 1412 Last data filed at 03/14/2020 2026 Gross per 24 hour  Intake --  Output 350 ml  Net -350 ml   Filed Weights   03/13/20 1857  Weight: 94.1 kg    Examination:  GENERAL: No apparent distress.  Nontoxic. HEENT: MMM.  Vision and hearing grossly intact.  NECK: Supple.  No apparent JVD.  RESP:  No IWOB.  Fair aeration bilaterally. CVS:  RRR. Heart sounds normal.  ABD/GI/GU: BS+. Abd soft, NTND.  MSK/EXT:  Moves extremities. No apparent deformity. No edema.  SKIN: no apparent skin lesion or wound NEURO: Awake, alert and oriented  appropriately.  No apparent focal neuro deficit. PSYCH: Calm. Normal affect.   Procedures:  None  Microbiology summarized: RFFMB-84, influenza and RSV PCR nonreactive. MRSA PCR nonreactive but obtained late. Blood cultures NGTD.  Assessment & Plan: Septic shock due to multifocal pneumonia: POA.  BP 78/53 on admission.  CTA chest negative for PE but extensive patchy consolidation and GG opacity bilaterally, and stable looking cavitary perihilar RUL mass.  Blood and urine cultures NGTD. -IV cefepime and vancomycin 11/19>> given recent hospitalization.  Non-small cell lung cancer, adenocarcinoma: Started chemoradiation on 02/10/2020. Status post 4  cycles.  Followed by Dr. Julien Nordmann and rad Julio Alm. -Outpatient follow-up with oncology and radiation oncology after treatment for pneumonia  Normocytic anemia: Likely anemia of chronic disease.  Baseline Hgb 9-11>> 8.9 (admit)>> 8.0.  Likely dilutional from IV fluid.  Denies GI bleed.  Anemia panel on 11/11 consistent with anemia of chronic disease.  Folic acid 5.2. -Discontinue IV fluid -Continue monitoring -Folic acid supplementation  Hypokalemia/hypomagnesemia: K3.1.  Mg 1.6. -Replenish and recheck  Anxiety/insomnia: -Continue home Ativan and trazodone  Bilateral arm swelling: Likely from IV fluid. -Discontinue IV fluid  Debility/generalized weakness -PT/OT  Body mass index is 29.35 kg/m.         DVT prophylaxis:  enoxaparin (LOVENOX) injection 40 mg Start: 03/13/20 2200  Code Status: Partial code-no intubation. Family Communication: Updated patient's wife at bedside. Status is: Inpatient  Remains inpatient appropriate because:Persistent severe electrolyte disturbances,  Unsafe d/c plan, IV treatments appropriate due to intensity of illness or inability to take PO and Inpatient level of care appropriate due to severity of illness   Dispo: The patient is from: Home              Anticipated d/c is to: Home               Anticipated d/c date is: 3 days              Patient currently is not medically stable to d/c.       Consultants:  None   Sch Meds:  Scheduled Meds: . enoxaparin (LOVENOX) injection  40 mg Subcutaneous Q24H  . fenofibrate  160 mg Oral Daily  . folic acid  1 mg Oral Daily  . guaiFENesin  600 mg Oral BID  . hydrocortisone sod succinate (SOLU-CORTEF) inj  50 mg Intravenous Q12H  . traZODone  50 mg Oral QHS   Continuous Infusions: . sodium chloride 100 mL/hr at 03/15/20 0957  . ceFEPime (MAXIPIME) IV 2 g (03/15/20 1203)  . vancomycin 1,500 mg (03/15/20 0956)   PRN Meds:.acetaminophen **OR** acetaminophen, albuterol, HYDROcodone-homatropine, LORazepam, ondansetron **OR** ondansetron (ZOFRAN) IV  Antimicrobials: Anti-infectives (From admission, onward)   Start     Dose/Rate Route Frequency Ordered Stop   03/13/20 2200  vancomycin (VANCOREADY) IVPB 1500 mg/300 mL        1,500 mg 150 mL/hr over 120 Minutes Intravenous Every 12 hours 03/13/20 1801     03/13/20 2000  ceFEPIme (MAXIPIME) 2 g in sodium chloride 0.9 % 100 mL IVPB        2 g 200 mL/hr over 30 Minutes Intravenous Every 8 hours 03/13/20 1801     03/13/20 1100  vancomycin (VANCOCIN) IVPB 1000 mg/200 mL premix        1,000 mg 200 mL/hr over 60 Minutes Intravenous  Once 03/13/20 1045 03/13/20 1429   03/13/20 1100  ceFEPIme (MAXIPIME) 2 g in sodium chloride 0.9 % 100 mL IVPB        2 g 200 mL/hr over 30 Minutes Intravenous  Once 03/13/20 1045 03/13/20 1159   03/13/20 1100  azithromycin (ZITHROMAX) 500 mg in sodium chloride 0.9 % 250 mL IVPB        500 mg 250 mL/hr over 60 Minutes Intravenous  Once 03/13/20 1045 03/13/20 1333       I have personally reviewed the following labs and images: CBC: Recent Labs  Lab 03/09/20 0841 03/13/20 1043 03/13/20 1923 03/14/20 0846 03/15/20 0529  WBC 3.7* 4.5 4.8 4.6 6.5  NEUTROABS 2.8 3.3  --   --   --   HGB 9.2* 8.9* 8.1* 8.7* 8.0*  HCT 26.0* 25.8* 23.2* 25.5* 23.1*  MCV  85.8 87.8 87.9 88.2 87.8  PLT 128* 191 188 196 219   BMP &GFR Recent Labs  Lab 03/09/20 0841 03/13/20 1043 03/13/20 1445 03/13/20 1923 03/14/20 0846 03/15/20 0529  NA 133* 132*  --   --  137 137  K 3.0* 2.5*  --   --  3.5 3.1*  CL 97* 92*  --   --  102 103  CO2 29 29  --   --  27 25  GLUCOSE 109* 111*  --   --  130* 132*  BUN 14 11  --   --  11 12  CREATININE 0.69 0.91  --  0.60* 0.60* 0.57*  CALCIUM 8.3* 8.3*  --   --  7.5* 7.4*  MG  --   --  1.8  --   --  1.6*   Estimated Creatinine Clearance: 115.6 mL/min (A) (by C-G formula based on SCr of 0.57 mg/dL (L)). Liver & Pancreas: Recent Labs  Lab 03/09/20 0841 03/13/20 1043 03/14/20 0846 03/15/20 0529  AST 27 45* 26 41  ALT 20 24 19 24   ALKPHOS 71 84 69 62  BILITOT 1.0 1.1 1.0 0.5  PROT 5.3* 6.1* 5.0* 4.6*  ALBUMIN 2.1* 2.5* 2.0* 1.8*   No results for input(s): LIPASE, AMYLASE in the last 168 hours. No results for input(s): AMMONIA in the last 168 hours. Diabetic: No results for input(s): HGBA1C in the last 72 hours. No results for input(s): GLUCAP in the last 168 hours. Cardiac Enzymes: No results for input(s): CKTOTAL, CKMB, CKMBINDEX, TROPONINI in the last 168 hours. No results for input(s): PROBNP in the last 8760 hours. Coagulation Profile: Recent Labs  Lab 03/13/20 1043  INR 1.1   Thyroid Function Tests: No results for input(s): TSH, T4TOTAL, FREET4, T3FREE, THYROIDAB in the last 72 hours. Lipid Profile: No results for input(s): CHOL, HDL, LDLCALC, TRIG, CHOLHDL, LDLDIRECT in the last 72 hours. Anemia Panel: No results for input(s): VITAMINB12, FOLATE, FERRITIN, TIBC, IRON, RETICCTPCT in the last 72 hours. Urine analysis:    Component Value Date/Time   COLORURINE YELLOW 03/13/2020 1400   APPEARANCEUR CLEAR 03/13/2020 1400   LABSPEC 1.005 03/13/2020 1400   PHURINE 6.0 03/13/2020 1400   GLUCOSEU NEGATIVE 03/13/2020 1400   GLUCOSEU NEGATIVE 12/12/2011 0944   HGBUR NEGATIVE 03/13/2020 1400    BILIRUBINUR NEGATIVE 03/13/2020 1400   KETONESUR NEGATIVE 03/13/2020 1400   PROTEINUR NEGATIVE 03/13/2020 1400   UROBILINOGEN 0.2 12/12/2011 0944   NITRITE NEGATIVE 03/13/2020 1400   LEUKOCYTESUR NEGATIVE 03/13/2020 1400   Sepsis Labs: Invalid input(s): PROCALCITONIN, [place]  Microbiology: Recent Results (from the past 240 hour(s))  Blood Culture (routine x 2)     Status: None   Collection Time: 03/05/20  5:08 PM   Specimen: BLOOD  Result Value Ref Range Status   Specimen Description   Final    BLOOD LEFT ANTECUBITAL Performed at [facility] [address], [place], [place] [address]    Special Requests   Final    BOTTLES DRAWN AEROBIC AND ANAEROBIC Blood Culture adequate volume Performed at [facility] [address] [place], [place] [address]    Culture   Final    NO GROWTH 5 DAYS Performed at [facility], [place] [address], [place], [place] [address]    Report Status 03/10/2020 FINAL  Final  Blood Culture (routine x 2)     Status: None   Collection Time: 03/05/20  5:08 PM   Specimen: BLOOD RIGHT FOREARM  Result Value Ref Range Status   Specimen Description   Final    BLOOD RIGHT FOREARM Performed at [facility] [address], [place], [place] [address]    Special Requests   Final    BOTTLES DRAWN AEROBIC AND ANAEROBIC Blood Culture adequate volume Performed at [facility] [address] [place] [address], [place],  [address]    Culture   Final    NO GROWTH 5 DAYS Performed at [facility], [place] [address] [place],  [address]    Report Status 03/10/2020 FINAL  Final  Urine culture     Status: Abnormal   Collection Time: 03/05/20  6:20 PM   Specimen: In/Out Cath Urine  Result Value Ref Range Status   Specimen Description  Final    IN/OUT CATH URINE Performed at [facility], [place] [address], [place], [place] [address]    Special Requests   Final     NONE Performed at [facility], [place] [address], [place], [place] [address]    Culture 7,000 COLONIES/mL ENTEROCOCCUS FAECALIS (A)  Final   Report Status 03/08/2020 FINAL  Final   Organism ID, Bacteria ENTEROCOCCUS FAECALIS (A)  Final      Susceptibility   Enterococcus faecalis - MIC*    AMPICILLIN <=2 SENSITIVE Sensitive     NITROFURANTOIN <=16 SENSITIVE Sensitive     VANCOMYCIN 2 SENSITIVE Sensitive     * 7,000 COLONIES/mL ENTEROCOCCUS FAECALIS  Respiratory Panel by RT PCR (Flu A&B, Covid) - Nasopharyngeal Swab     Status: None   Collection Time: 03/05/20  7:32 PM   Specimen: Nasopharyngeal Swab  Result Value Ref Range Status   SARS Coronavirus 2 by RT PCR NEGATIVE NEGATIVE Final    Comment: (NOTE) SARS-CoV-2 target nucleic acids are NOT DETECTED.  The SARS-CoV-2 RNA is generally detectable in upper respiratoy specimens during the acute phase of infection. The lowest concentration of SARS-CoV-2 viral copies this assay can detect is 131 copies/mL. A negative result does not preclude SARS-Cov-2 infection and should not be used as the sole basis for treatment or other patient management decisions. A negative result may occur with  improper specimen collection/handling, submission of specimen other than nasopharyngeal swab, presence of viral mutation(s) within the areas targeted by this assay, and inadequate number of viral copies (<131 copies/mL). A negative result must be combined with clinical observations, patient history, and epidemiological information. The expected result is Negative.  Fact Sheet for Patients:  PinkCheek.be  Fact Sheet for Healthcare Providers:  GravelBags.it  This test is no t yet approved or cleared by the [place] FDA and  has been authorized for detection and/or diagnosis of SARS-CoV-2 by FDA under an Emergency Use Authorization (EUA). This EUA will remain  in effect  (meaning this test can be used) for the duration of the COVID-19 declaration under Section 564(b)(1) of the Act, 21 U.S.C. section 360bbb-3(b)(1), unless the authorization is terminated or revoked sooner.     Influenza A by PCR NEGATIVE NEGATIVE Final   Influenza B by PCR NEGATIVE NEGATIVE Final    Comment: (NOTE) The Xpert Xpress SARS-CoV-2/FLU/RSV assay is intended as an aid in  the diagnosis of influenza from Nasopharyngeal swab specimens and  should not be used as a sole basis for treatment. Nasal washings and  aspirates are unacceptable for Xpert Xpress SARS-CoV-2/FLU/RSV  testing.  Fact Sheet for Patients: PinkCheek.be  Fact Sheet for Healthcare Providers: GravelBags.it  This test is not yet approved or cleared by the [place] FDA and  has been authorized for detection and/or diagnosis of SARS-CoV-2 by  FDA under an Emergency Use Authorization (EUA). This EUA will remain  in effect (meaning this test can be used) for the duration of the  Covid-19 declaration under Section 564(b)(1) of the Act, 21  U.S.C. section 360bbb-3(b)(1), unless the authorization is  terminated or revoked. Performed at [facility], [place] [address], [place], [place] [address]   MRSA PCR Screening     Status: Abnormal   Collection Time: 03/06/20  6:51 AM   Specimen: Nasopharyngeal  Result Value Ref Range Status   MRSA by PCR POSITIVE (A) NEGATIVE Final    Comment:        The GeneXpert MRSA Assay (  FDA approved for NASAL specimens only), is one component of a comprehensive MRSA colonization surveillance program. It is not intended to diagnose MRSA infection nor to guide or monitor treatment for MRSA infections. RESULT CALLED TO, READ BACK BY AND VERIFIED WITH: A,BANO AT 1000 ON 03/06/20 BY A,MOHAMED Performed at [facility], [place] [address], [place], [place] [address]   Expectorated sputum  assessment w rflx to resp cult     Status: None   Collection Time: 03/06/20  6:58 AM   Specimen: Expectorated Sputum  Result Value Ref Range Status   Specimen Description EXPECTORATED SPUTUM  Final   Special Requests NONE  Final   Sputum evaluation   Final    THIS SPECIMEN IS ACCEPTABLE FOR SPUTUM CULTURE Performed at [facility], [place] [address] [place] [address] [place], [place] [address]    Report Status 03/06/2020 FINAL  Final  Culture, respiratory     Status: None   Collection Time: 03/06/20  6:58 AM  Result Value Ref Range Status   Specimen Description   Final    EXPECTORATED SPUTUM Performed at [place] [address] [place], [place] [address]    Special Requests   Final    NONE Reflexed from [phone] Performed at [facility], [place] [address] [place], [place] [address]    [place] Stain   Final    NO WBC SEEN RARE GRAM POSITIVE COCCI IN PAIRS RARE GRAM VARIABLE ROD Performed at [facility], [place] [address], [place], [place] [address]    Culture FEW METHICILLIN RESISTANT STAPHYLOCOCCUS AUREUS  Final   Report Status 03/08/2020 FINAL  Final   Organism ID, Bacteria METHICILLIN RESISTANT STAPHYLOCOCCUS AUREUS  Final      Susceptibility   Methicillin resistant staphylococcus aureus - MIC*    CIPROFLOXACIN >=8 RESISTANT Resistant     ERYTHROMYCIN <=0.25 SENSITIVE Sensitive     GENTAMICIN <=0.5 SENSITIVE Sensitive     OXACILLIN >=4 RESISTANT Resistant     TETRACYCLINE <=1 SENSITIVE Sensitive     VANCOMYCIN 1 SENSITIVE Sensitive     TRIMETH/SULFA <=10 SENSITIVE Sensitive     CLINDAMYCIN <=0.25 SENSITIVE Sensitive     RIFAMPIN <=0.5 SENSITIVE Sensitive     Inducible Clindamycin NEGATIVE Sensitive     * FEW METHICILLIN RESISTANT STAPHYLOCOCCUS AUREUS  Blood Culture (routine x 2)     Status: None (Preliminary result)   Collection Time: 03/13/20 10:43 AM   Specimen: BLOOD  Result Value Ref Range Status   Specimen Description    Final    BLOOD LEFT ARM Performed at [facility] [address] [place], [place] [address]    Special Requests   Final    BOTTLES DRAWN AEROBIC AND ANAEROBIC Blood Culture results may not be optimal due to an inadequate volume of blood received in culture bottles Performed at [facility] [address] [place], [place] [address]    Culture   Final    NO GROWTH 2 DAYS Performed at [place] [address], [place], [place] [address]    Report Status PENDING  Incomplete  Blood Culture (routine x 2)     Status: None (Preliminary result)   Collection Time: 03/13/20 10:48 AM   Specimen: BLOOD  Result Value Ref Range Status   Specimen Description   Final    BLOOD LEFT ARM Performed at [facility] [address] [place], [place] [address]    Special Requests  Final    BOTTLES DRAWN AEROBIC AND ANAEROBIC Blood Culture adequate volume Performed at [place] [address], [place], [place] [address]    Culture   Final    NO GROWTH 2 DAYS Performed at [facility] [address], [place], [place] [address]    Report Status PENDING  Incomplete  Resp Panel by RT-PCR (Flu A&B, Covid) Nasopharyngeal Swab     Status: None   Collection Time: 03/13/20 11:08 AM   Specimen: Nasopharyngeal Swab; Nasopharyngeal(NP) swabs in vial transport medium  Result Value Ref Range Status   SARS Coronavirus 2 by RT PCR NEGATIVE NEGATIVE Final    Comment: (NOTE) SARS-CoV-2 target nucleic acids are NOT DETECTED.  The SARS-CoV-2 RNA is generally detectable in upper respiratory specimens during the acute phase of infection. The lowest concentration of SARS-CoV-2 viral copies this assay can detect is 138 copies/mL. A negative result does not preclude SARS-Cov-2 infection and should not be used as the sole basis for treatment or other patient management decisions. A negative result may occur with  improper  specimen collection/handling, submission of specimen other than nasopharyngeal swab, presence of viral mutation(s) within the areas targeted by this assay, and inadequate number of viral copies(<138 copies/mL). A negative result must be combined with clinical observations, patient history, and epidemiological information. The expected result is Negative.  Fact Sheet for Patients:  EntrepreneurPulse.com.au  Fact Sheet for Healthcare Providers:  IncredibleEmployment.be  This test is no t yet approved or cleared by the [place] FDA and  has been authorized for detection and/or diagnosis of SARS-CoV-2 by FDA under an Emergency Use Authorization (EUA). This EUA will remain  in effect (meaning this test can be used) for the duration of the COVID-19 declaration under Section 564(b)(1) of the Act, 21 U.S.C.section 360bbb-3(b)(1), unless the authorization is terminated  or revoked sooner.       Influenza A by PCR NEGATIVE NEGATIVE Final   Influenza B by PCR NEGATIVE NEGATIVE Final    Comment: (NOTE) The Xpert Xpress SARS-CoV-2/FLU/RSV plus assay is intended as an aid in the diagnosis of influenza from Nasopharyngeal swab specimens and should not be used as a sole basis for treatment. Nasal washings and aspirates are unacceptable for Xpert Xpress SARS-CoV-2/FLU/RSV testing.  Fact Sheet for Patients: EntrepreneurPulse.com.au  Fact Sheet for Healthcare Providers: IncredibleEmployment.be  This test is not yet approved or cleared by the [place] FDA and has been authorized for detection and/or diagnosis of SARS-CoV-2 by FDA under an Emergency Use Authorization (EUA). This EUA will remain in effect (meaning this test can be used) for the duration of the COVID-19 declaration under Section 564(b)(1) of the Act, 21 U.S.C. section 360bbb-3(b)(1), unless the authorization is terminated or revoked.  Performed at  [facility], [place] [address], [place], [place] [address]   Urine culture     Status: None   Collection Time: 03/13/20  2:00 PM   Specimen: In/Out Cath Urine  Result Value Ref Range Status   Specimen Description   Final    IN/OUT CATH URINE Performed at [facility] [address] [place], [place] [address]    Special Requests   Final    NONE Performed at [facility], [place] [address] [place], [place] [address]    Culture   Final    NO GROWTH Performed at [facility], [place] [address] [place], [place] [address]    Report Status 03/15/2020 FINAL  Final  MRSA PCR Screening     Status: None   Collection Time: 03/15/20  9:59 AM   Specimen: Nasopharyngeal  Result Value Ref Range Status   MRSA by PCR NEGATIVE NEGATIVE Final    Comment:        The GeneXpert MRSA Assay (FDA approved for NASAL specimens only), is one component of a comprehensive MRSA colonization surveillance program. It is not intended to diagnose MRSA infection nor to guide or monitor treatment for MRSA infections. Performed at [facility], [place] [address], [place], [place] [address]     Radiology Studies: No results found.    Joylyn Duggin T. [place]  If 7PM-7AM, please contact night-coverage [email or web address] 03/15/2020, 2:12 PM

## 2020-03-15 NOTE — Plan of Care (Signed)
  Problem: Education: Goal: Knowledge of General Education information will improve Description Including pain rating scale, medication(s)/side effects and non-pharmacologic comfort measures Outcome: Progressing   Problem: Health Behavior/Discharge Planning: Goal: Ability to manage health-related needs will improve Outcome: Progressing   

## 2020-03-16 ENCOUNTER — Inpatient Hospital Stay: Payer: BC Managed Care – PPO

## 2020-03-16 ENCOUNTER — Inpatient Hospital Stay: Payer: BC Managed Care – PPO | Admitting: Physician Assistant

## 2020-03-16 ENCOUNTER — Ambulatory Visit
Admission: RE | Admit: 2020-03-16 | Discharge: 2020-03-16 | Disposition: A | Discharge status: Home / Self Care | Payer: BC Managed Care – PPO | Source: Ambulatory Visit | Attending: Radiation Oncology | Admitting: Radiation Oncology

## 2020-03-16 DIAGNOSIS — M7989 Other specified soft tissue disorders: Secondary | ICD-10-CM | POA: Diagnosis not present

## 2020-03-16 DIAGNOSIS — F419 Anxiety disorder, unspecified: Secondary | ICD-10-CM | POA: Diagnosis not present

## 2020-03-16 DIAGNOSIS — A419 Sepsis, unspecified organism: Secondary | ICD-10-CM | POA: Diagnosis not present

## 2020-03-16 DIAGNOSIS — G4700 Insomnia, unspecified: Secondary | ICD-10-CM | POA: Diagnosis not present

## 2020-03-16 LAB — RENAL FUNCTION PANEL
Albumin: 2 g/dL — ABNORMAL LOW (ref 3.5–5.0)
Anion gap: 9 (ref 5–15)
BUN: 10 mg/dL (ref 6–20)
CO2: 25 mmol/L (ref 22–32)
Calcium: 7.9 mg/dL — ABNORMAL LOW (ref 8.9–10.3)
Chloride: 103 mmol/L (ref 98–111)
Creatinine, Ser: 0.61 mg/dL (ref 0.61–1.24)
GFR, Estimated: >60 mL/min (ref >60)
Glucose, Bld: 96 mg/dL (ref 70–99)
Phosphorus: 1.6 mg/dL — ABNORMAL LOW (ref 2.5–4.6)
Potassium: 3.2 mmol/L — ABNORMAL LOW (ref 3.5–5.1)
Sodium: 137 mmol/L (ref 135–145)

## 2020-03-16 LAB — CBC
HCT: 22.9 % — ABNORMAL LOW (ref 39.0–52.0)
Hemoglobin: 7.7 g/dL — ABNORMAL LOW (ref 13.0–17.0)
MCH: 30.1 pg (ref 26.0–34.0)
MCHC: 33.6 g/dL (ref 30.0–36.0)
MCV: 89.5 fL (ref 80.0–100.0)
Platelets: 239 10*3/uL (ref 150–400)
RBC: 2.56 MIL/uL — ABNORMAL LOW (ref 4.22–5.81)
RDW: 13.2 % (ref 11.5–15.5)
WBC: 5.2 10*3/uL (ref 4.0–10.5)
nRBC: 0 % (ref 0.0–0.2)

## 2020-03-16 LAB — LEGIONELLA PNEUMOPHILA SEROGP 1 UR AG: L. pneumophila Serogp 1 Ur Ag: NEGATIVE

## 2020-03-16 LAB — MAGNESIUM: Magnesium: 2 mg/dL (ref 1.7–2.4)

## 2020-03-16 MED ORDER — LORAZEPAM 1 MG PO TABS
1.0000 mg | ORAL_TABLET | Freq: Three times a day (TID) | ORAL | Status: DC
  Administered 2020-03-16 – 2020-03-17 (×3): 1 mg via ORAL
  Filled 2020-03-16 (×3): qty 1

## 2020-03-16 MED ORDER — POTASSIUM PHOSPHATES 15 MMOLE/5ML IV SOLN
30.0000 mmol | Freq: Once | INTRAVENOUS | Status: AC
  Administered 2020-03-16: 30 mmol via INTRAVENOUS
  Filled 2020-03-16: qty 10

## 2020-03-16 MED ORDER — HYDROCORTISONE NA SUCCINATE PF 100 MG IJ SOLR
50.0000 mg | Freq: Every day | INTRAMUSCULAR | Status: AC
  Administered 2020-03-16: 50 mg via INTRAVENOUS
  Filled 2020-03-16: qty 2

## 2020-03-16 MED ORDER — POTASSIUM CHLORIDE CRYS ER 20 MEQ PO TBCR
40.0000 meq | EXTENDED_RELEASE_TABLET | Freq: Once | ORAL | Status: AC
  Administered 2020-03-16: 40 meq via ORAL
  Filled 2020-03-16: qty 2

## 2020-03-16 MED ORDER — ADULT MULTIVITAMIN W/MINERALS CH
1.0000 | ORAL_TABLET | Freq: Every day | ORAL | Status: DC
  Administered 2020-03-16 – 2020-03-17 (×2): 1 via ORAL
  Filled 2020-03-16 (×2): qty 1

## 2020-03-16 NOTE — Progress Notes (Signed)
Pharmacy Antibiotic Note  Richard Li is a 59 y.o. male with hx lung cancer and recent PNA, presented to the ED on 03/13/2020 with hypotension, SOB and hypokalemia.  Chest CTA on 11/19 showed findings with concern for multilobar PNA.  He's currently on vancomycin and cefepime for infection.  Today, 03/16/2020: - day #4 abx - Afeb, wbc wnl, scr 0.61 (crcl~100) - all cultures have been negative thus far - MRSA PCR neg  Plan: -Per Dr. Cyndia Skeeters, d/c vancomycin for neg MRSA pcr - continue cefepime 2gm IV q8h  _______________________________________________  Height: 5' 10.5" (179.1 cm) Weight: 94.1 kg (207 lb 8 oz) IBW/kg (Calculated) : 74.15  Temp (24hrs), Avg:97.7 F (36.5 C), Min:97.6 F (36.4 C), Max:97.8 F (36.6 C)  Recent Labs  Lab 03/13/20 1043 03/13/20 1923 03/14/20 0846 03/15/20 0529 03/16/20 0533  WBC 4.5 4.8 4.6 6.5 5.2  CREATININE 0.91 0.60* 0.60* 0.57* 0.61  LATICACIDVEN 1.9  --   --   --   --     Estimated Creatinine Clearance: 115.6 mL/min (by C-G formula based on SCr of 0.61 mg/dL).    No Known Allergies  Antimicrobials this admission: 11/19 Azithromycin x1 11/19 cefepime  >>  11/19 vancomycin >> 11/22  Microbiology results: Prev Cx 11/12 SputumCx: MRSA  11/19 BCx x2:  11/19 UCx: neg FINAL 11/19 Legionella antigen: in process 11/19 Strep pneumoniae antigen: neg 11/21 MRSA PCR: neg  Thank you for allowing pharmacy to be a part of this patient's care.  Lynelle Doctor 03/16/2020 10:12 AM

## 2020-03-16 NOTE — Plan of Care (Signed)
  Problem: Education: Goal: Knowledge of General Education information will improve Description Including pain rating scale, medication(s)/side effects and non-pharmacologic comfort measures Outcome: Progressing   Problem: Health Behavior/Discharge Planning: Goal: Ability to manage health-related needs will improve Outcome: Progressing   

## 2020-03-16 NOTE — Progress Notes (Signed)
Initial Nutrition Assessment  INTERVENTION:   -Encouraged PO intakes -Multivitamin with minerals daily  NUTRITION DIAGNOSIS:   Increased nutrient needs related to cancer and cancer related treatments as evidenced by estimated needs.  GOAL:   Patient will meet greater than or equal to 90% of their needs  MONITOR:   PO intake, Supplement acceptance, Labs, Weight trends, I & O's  REASON FOR ASSESSMENT:   Consult Assessment of nutrition requirement/status  ASSESSMENT:   59 year old male with history of lung cancer on radiation therapy and recent hospitalization for presenting with fever, chills, increased cough and fatigue and admitted for septic shock due to multifocal pneumonia.  Patient in room, no family at bedside. Pt states his wife is coming to bring him some [employer]. Pt states his appetite has been poor since last week when he discharged from the hospital. Pt states at most he will finish 1/2 of a meal. Pt states he tried Ensure and did not like it. Prefers Body Armor drinks at home (which are not protein drinks). Pt states he does try and eat protein foods. Encouraged him to have family bring in foods he would enjoy.  Per weight records, pt's weight is stable currently.  Medications: folic acid, K Phos  Labs reviewed: Low K, Phos Mg WNL  NUTRITION - FOCUSED PHYSICAL EXAM:  No depletions noted.  Diet Order:   Diet Order            Diet regular Room service appropriate? Yes; Fluid consistency: Thin  Diet effective now                 EDUCATION NEEDS:   Education needs have been addressed  Skin:  Skin Assessment: Reviewed RN Assessment  Last BM:  11/13  Height:   Ht Readings from Last 1 Encounters:  03/13/20 5' 10.5" (1.791 m)    Weight:   Wt Readings from Last 1 Encounters:  03/13/20 94.1 kg    BMI:  Body mass index is 29.35 kg/m.  Estimated Nutritional Needs:   Kcal:  2200-2400  Protein:  110-120g  Fluid:  2.2L/day  Richard Bibles, MS, RD, LDN Inpatient Clinical Dietitian Contact information available via Amion

## 2020-03-16 NOTE — Progress Notes (Signed)
PROGRESS NOTE  Richard Li AYT:[id number] DOB: 1960-09-27   PCP: [name], DO  Patient is from: Home  DOA: 03/13/2020 LOS: 3  Chief complaints: Cough  Brief Narrative / Interim history: 59 year old male with history of lung cancer on radiation therapy and recent hospitalization for presenting with fever, chills, increased cough and fatigue and admitted for septic shock due to multifocal pneumonia.  CXR revealed progressive right lung airspace disease. CTA chest negative for PE but extensive patchy consolidation and GG opacity bilaterally, and stable looking cavitary perihilar RUL mass.  Started on cefepime, vancomycin and azithromycin.  Blood cultures NGTD.  Patient was continued on cefepime and vancomycin.  Sepsis physiology resolved.  MRSA PCR nonreactive. Vancomycin discontinued.  Currently on cefepime to complete antibiotic course.  Subjective: Seen and examined earlier this morning.  Reports an incident of panic attack overnight after severe cough.  Cough is productive with whitish phlegm.  He denies hemoptysis.  He reports shortness of breath at times but he denies chest pain, GI or UTI symptoms.  Objective: Vitals:   03/15/20 0457 03/15/20 1421 03/15/20 2057 03/16/20 0615  BP: 127/64 121/65 (!) 142/70 135/69  Pulse: 78 79 84 69  Resp: 18 16 16 16   Temp: (!) 97.4 F (36.3 C) 97.7 F (36.5 C) 97.8 F (36.6 C) 97.6 F (36.4 C)  TempSrc: Oral Oral    SpO2: 97% 96% 100% 94%  Weight:      Height:        Intake/Output Summary (Last 24 hours) at 03/16/2020 1221 Last data filed at 03/15/2020 1800 Gross per 24 hour  Intake 3851.75 ml  Output -  Net 3851.75 ml   Filed Weights   03/13/20 1857  Weight: 94.1 kg    Examination:  GENERAL: No apparent distress.  Nontoxic. HEENT: MMM.  Vision and hearing grossly intact.  NECK: Supple.  No apparent JVD.  RESP: On room air.  No IWOB.  Fair aeration bilaterally. CVS:  RRR. Heart sounds normal.  ABD/GI/GU: BS+. Abd  soft, NTND.  MSK/EXT:  Moves extremities. No apparent deformity. No edema.  SKIN: no apparent skin lesion or wound NEURO: Awake, alert and oriented appropriately.  No apparent focal neuro deficit. PSYCH: Calm. Normal affect.  Procedures:  None  Microbiology summarized: TFTDD-22, influenza and RSV PCR nonreactive. MRSA PCR nonreactive but obtained late. Blood cultures NGTD.  Assessment & Plan: Septic shock due to multifocal pneumonia: POA.  BP 78/53 on admission.  CTA chest negative for PE but extensive patchy consolidation and GG opacity bilaterally, and stable looking cavitary perihilar RUL mass.  Blood and urine cultures NGTD.  MRSA PCR negative. -Azithromycin on 11/19.  Vancomycin 11/19-11/21. -IV cefepime 11/19>>  Non-small cell lung cancer, adenocarcinoma: Started chemoradiation on 02/10/2020. S/p 4 cycles.  Followed by Dr. Julien Nordmann and rad Julio Alm. -Outpatient follow-up with oncology and radiation oncology after treatment for pneumonia -Added oncology to treatment team.  Normocytic anemia: Likely anemia of chronic disease.  Baseline Hgb 9-11>> 8.9 (admit)>> 8.0> 7.7.  Likely dilutional from IV fluid.  Denies GI bleed.  Anemia panel on 11/11 consistent with anemia of chronic disease.  Folic acid 5.2. -Discontinued IV fluid -Continue monitoring -Folic acid supplementation  Hypokalemia/hypomagnesemia/hypophosphatemia: K3.2.  P1.6. -Replenish and recheck  Anxiety/insomnia: Reports having panic attack overnight. -Schedule home Ativan at 1 mg 3 times daily-not a great choice going forward -Continue home trazodone nightly  Bilateral arm swelling: Likely from IV fluid. -Discontinued IV fluid  Debility/generalized weakness -PT/OT  Body mass index is 29.35  kg/m.         DVT prophylaxis:  enoxaparin (LOVENOX) injection 40 mg Start: 03/13/20 2200  Code Status: Partial code-no intubation. Family Communication: Updated patient's wife at bedside on 11/22.. Status is: Inpatient   Remains inpatient appropriate because:Persistent severe electrolyte disturbances, Unsafe d/c plan, IV treatments appropriate due to intensity of illness or inability to take PO and Inpatient level of care appropriate due to severity of illness   Dispo: The patient is from: Home              Anticipated d/c is to: Home              Anticipated d/c date is: 2 days              Patient currently is not medically stable to d/c.       Consultants:  Oncology   Sch Meds:  Scheduled Meds: . enoxaparin (LOVENOX) injection  40 mg Subcutaneous Q24H  . fenofibrate  160 mg Oral Daily  . folic acid  1 mg Oral Daily  . guaiFENesin  600 mg Oral BID  . traZODone  50 mg Oral QHS   Continuous Infusions: . ceFEPime (MAXIPIME) IV 2 g (03/16/20 1209)  . potassium PHOSPHATE IVPB (in mmol) 30 mmol (03/16/20 0910)   PRN Meds:.acetaminophen **OR** acetaminophen, albuterol, HYDROcodone-homatropine, LORazepam, ondansetron **OR** ondansetron (ZOFRAN) IV  Antimicrobials: Anti-infectives (From admission, onward)   Start     Dose/Rate Route Frequency Ordered Stop   03/13/20 2200  vancomycin (VANCOREADY) IVPB 1500 mg/300 mL  Status:  Discontinued        1,500 mg 150 mL/hr over 120 Minutes Intravenous Every 12 hours 03/13/20 1801 03/16/20 1007   03/13/20 2000  ceFEPIme (MAXIPIME) 2 g in sodium chloride 0.9 % 100 mL IVPB        2 g 200 mL/hr over 30 Minutes Intravenous Every 8 hours 03/13/20 1801     03/13/20 1100  vancomycin (VANCOCIN) IVPB 1000 mg/200 mL premix        1,000 mg 200 mL/hr over 60 Minutes Intravenous  Once 03/13/20 1045 03/13/20 1429   03/13/20 1100  ceFEPIme (MAXIPIME) 2 g in sodium chloride 0.9 % 100 mL IVPB        2 g 200 mL/hr over 30 Minutes Intravenous  Once 03/13/20 1045 03/13/20 1159   03/13/20 1100  azithromycin (ZITHROMAX) 500 mg in sodium chloride 0.9 % 250 mL IVPB        500 mg 250 mL/hr over 60 Minutes Intravenous  Once 03/13/20 1045 03/13/20 1333       I have  personally reviewed the following labs and images: CBC: Recent Labs  Lab 03/13/20 1043 03/13/20 1923 03/14/20 0846 03/15/20 0529 03/16/20 0533  WBC 4.5 4.8 4.6 6.5 5.2  NEUTROABS 3.3  --   --   --   --   HGB 8.9* 8.1* 8.7* 8.0* 7.7*  HCT 25.8* 23.2* 25.5* 23.1* 22.9*  MCV 87.8 87.9 88.2 87.8 89.5  PLT 191 188 196 219 239   BMP &GFR Recent Labs  Lab 03/13/20 1043 03/13/20 1445 03/13/20 1923 03/14/20 0846 03/15/20 0529 03/16/20 0533  NA 132*  --   --  137 137 137  K 2.5*  --   --  3.5 3.1* 3.2*  CL 92*  --   --  102 103 103  CO2 29  --   --  27 25 25   GLUCOSE 111*  --   --  130* 132* 96  BUN 11  --   --  11 12 10   CREATININE 0.91  --  0.60* 0.60* 0.57* 0.61  CALCIUM 8.3*  --   --  7.5* 7.4* 7.9*  MG  --  1.8  --   --  1.6* 2.0  PHOS  --   --   --   --   --  1.6*   Estimated Creatinine Clearance: 115.6 mL/min (by C-G formula based on SCr of 0.61 mg/dL). Liver & Pancreas: Recent Labs  Lab 03/13/20 1043 03/14/20 0846 03/15/20 0529 03/16/20 0533  AST 45* 26 41  --   ALT 24 19 24   --   ALKPHOS 84 69 62  --   BILITOT 1.1 1.0 0.5  --   PROT 6.1* 5.0* 4.6*  --   ALBUMIN 2.5* 2.0* 1.8* 2.0*   No results for input(s): LIPASE, AMYLASE in the last 168 hours. No results for input(s): AMMONIA in the last 168 hours. Diabetic: No results for input(s): HGBA1C in the last 72 hours. No results for input(s): GLUCAP in the last 168 hours. Cardiac Enzymes: No results for input(s): CKTOTAL, CKMB, CKMBINDEX, TROPONINI in the last 168 hours. No results for input(s): PROBNP in the last 8760 hours. Coagulation Profile: Recent Labs  Lab 03/13/20 1043  INR 1.1   Thyroid Function Tests: No results for input(s): TSH, T4TOTAL, FREET4, T3FREE, THYROIDAB in the last 72 hours. Lipid Profile: No results for input(s): CHOL, HDL, LDLCALC, TRIG, CHOLHDL, LDLDIRECT in the last 72 hours. Anemia Panel: No results for input(s): VITAMINB12, FOLATE, FERRITIN, TIBC, IRON, RETICCTPCT in the last  72 hours. Urine analysis:    Component Value Date/Time   COLORURINE YELLOW 03/13/2020 1400   APPEARANCEUR CLEAR 03/13/2020 1400   LABSPEC 1.005 03/13/2020 1400   PHURINE 6.0 03/13/2020 1400   GLUCOSEU NEGATIVE 03/13/2020 1400   GLUCOSEU NEGATIVE 12/12/2011 0944   HGBUR NEGATIVE 03/13/2020 1400   BILIRUBINUR NEGATIVE 03/13/2020 1400   KETONESUR NEGATIVE 03/13/2020 1400   PROTEINUR NEGATIVE 03/13/2020 1400   UROBILINOGEN 0.2 12/12/2011 0944   NITRITE NEGATIVE 03/13/2020 1400   LEUKOCYTESUR NEGATIVE 03/13/2020 1400   Sepsis Labs: Invalid input(s): PROCALCITONIN, [place]  Microbiology: Recent Results (from the past 240 hour(s))  Blood Culture (routine x 2)     Status: None (Preliminary result)   Collection Time: 03/13/20 10:43 AM   Specimen: BLOOD  Result Value Ref Range Status   Specimen Description   Final    BLOOD LEFT ARM Performed at [facility] [address] [place], [place] [address]    Special Requests   Final    BOTTLES DRAWN AEROBIC AND ANAEROBIC Blood Culture results may not be optimal due to an inadequate volume of blood received in culture bottles Performed at [facility] [address], [place], [place] [address]    Culture   Final    NO GROWTH 3 DAYS Performed at [facility], [place] [address] [place], [place] [address]    Report Status PENDING  Incomplete  Blood Culture (routine x 2)     Status: None (Preliminary result)   Collection Time: 03/13/20 10:48 AM   Specimen: BLOOD  Result Value Ref Range Status   Specimen Description   Final    BLOOD LEFT ARM Performed at [facility] [address] [place], [place] [address]    Special Requests   Final    BOTTLES DRAWN AEROBIC AND ANAEROBIC Blood Culture adequate volume Performed at [facility], [place] [address] [place], [place] [address]    Culture   Final    NO GROWTH 3 DAYS Performed at [facility],  [place] [address], [place], [place] [address]    Report Status PENDING  Incomplete  Resp Panel by RT-PCR (Flu A&B, Covid) Nasopharyngeal Swab     Status: None   Collection Time: 03/13/20 11:08 AM   Specimen: Nasopharyngeal Swab; Nasopharyngeal(NP) swabs in vial transport medium  Result Value Ref Range Status   SARS Coronavirus 2 by RT PCR NEGATIVE NEGATIVE Final    Comment: (NOTE) SARS-CoV-2 target nucleic acids are NOT DETECTED.  The SARS-CoV-2 RNA is generally detectable in upper respiratory specimens during the acute phase of infection. The lowest concentration of SARS-CoV-2 viral copies this assay can detect is 138 copies/mL. A negative result does not preclude SARS-Cov-2 infection and should not be used as the sole basis for treatment or other patient management decisions. A negative result may occur with  improper specimen collection/handling, submission of specimen other than nasopharyngeal swab, presence of viral mutation(s) within the areas targeted by this assay, and inadequate number of viral copies(<138 copies/mL). A negative result must be combined with clinical observations, patient history, and epidemiological information. The expected result is Negative.  Fact Sheet for Patients:  EntrepreneurPulse.com.au  Fact Sheet for Healthcare Providers:  IncredibleEmployment.be  This test is no t yet approved or cleared by the [place] FDA and  has been authorized for detection and/or diagnosis of SARS-CoV-2 by FDA under an Emergency Use Authorization (EUA). This EUA will remain  in effect (meaning this test can be used) for the duration of the COVID-19 declaration under Section 564(b)(1) of the Act, 21 U.S.C.section 360bbb-3(b)(1), unless the authorization is terminated  or revoked sooner.       Influenza A by PCR NEGATIVE NEGATIVE Final   Influenza B by PCR NEGATIVE NEGATIVE Final    Comment: (NOTE) The Xpert Xpress SARS-CoV-2/FLU/RSV  plus assay is intended as an aid in the diagnosis of influenza from Nasopharyngeal swab specimens and should not be used as a sole basis for treatment. Nasal washings and aspirates are unacceptable for Xpert Xpress SARS-CoV-2/FLU/RSV testing.  Fact Sheet for Patients: EntrepreneurPulse.com.au  Fact Sheet for Healthcare Providers: IncredibleEmployment.be  This test is not yet approved or cleared by the [place] FDA and has been authorized for detection and/or diagnosis of SARS-CoV-2 by FDA under an Emergency Use Authorization (EUA). This EUA will remain in effect (meaning this test can be used) for the duration of the COVID-19 declaration under Section 564(b)(1) of the Act, 21 U.S.C. section 360bbb-3(b)(1), unless the authorization is terminated or revoked.  Performed at [facility], [place] [address], [place], [place] [address]   Urine culture     Status: None   Collection Time: 03/13/20  2:00 PM   Specimen: In/Out Cath Urine  Result Value Ref Range Status   Specimen Description   Final    IN/OUT CATH URINE Performed at [facility] [address] [place], [place] [address]    Special Requests   Final    NONE Performed at [facility], [place] [address], [place], [place] [address]    Culture   Final    NO GROWTH Performed at [facility], [place] [address], [place],  [address]    Report Status 03/15/2020 FINAL  Final  MRSA PCR Screening     Status: None   Collection Time: 03/15/20  9:59  AM   Specimen: Nasopharyngeal  Result Value Ref Range Status   MRSA by PCR NEGATIVE NEGATIVE Final    Comment:        The GeneXpert MRSA Assay (FDA approved for NASAL specimens only), is one component of a comprehensive MRSA colonization surveillance program. It is not intended to diagnose MRSA infection nor to guide or monitor treatment for MRSA infections. Performed at [facility], [place] [address], [place], [place] [address]     Radiology Studies: No results found.    Shonika Kolasinski T. [place]  If 7PM-7AM, please contact night-coverage [email or web address] 03/16/2020, 12:21 PM

## 2020-03-17 ENCOUNTER — Ambulatory Visit: Payer: BC Managed Care – PPO

## 2020-03-17 ENCOUNTER — Ambulatory Visit
Admission: RE | Admit: 2020-03-17 | Discharge: 2020-03-17 | Disposition: A | Discharge status: Home / Self Care | Payer: BC Managed Care – PPO | Source: Ambulatory Visit | Attending: Radiation Oncology | Admitting: Radiation Oncology

## 2020-03-17 DIAGNOSIS — C3491 Malignant neoplasm of unspecified part of right bronchus or lung: Secondary | ICD-10-CM | POA: Diagnosis not present

## 2020-03-17 DIAGNOSIS — D638 Anemia in other chronic diseases classified elsewhere: Secondary | ICD-10-CM | POA: Diagnosis not present

## 2020-03-17 DIAGNOSIS — J189 Pneumonia, unspecified organism: Secondary | ICD-10-CM | POA: Diagnosis not present

## 2020-03-17 DIAGNOSIS — A419 Sepsis, unspecified organism: Secondary | ICD-10-CM | POA: Diagnosis not present

## 2020-03-17 LAB — RENAL FUNCTION PANEL
Albumin: 2 g/dL — ABNORMAL LOW (ref 3.5–5.0)
Anion gap: 7 (ref 5–15)
BUN: 10 mg/dL (ref 6–20)
CO2: 27 mmol/L (ref 22–32)
Calcium: 7.9 mg/dL — ABNORMAL LOW (ref 8.9–10.3)
Chloride: 103 mmol/L (ref 98–111)
Creatinine, Ser: 0.66 mg/dL (ref 0.61–1.24)
GFR, Estimated: >60 mL/min (ref >60)
Glucose, Bld: 92 mg/dL (ref 70–99)
Phosphorus: 1.9 mg/dL — ABNORMAL LOW (ref 2.5–4.6)
Potassium: 3.2 mmol/L — ABNORMAL LOW (ref 3.5–5.1)
Sodium: 137 mmol/L (ref 135–145)

## 2020-03-17 LAB — MAGNESIUM: Magnesium: 1.8 mg/dL (ref 1.7–2.4)

## 2020-03-17 LAB — HEMOGLOBIN AND HEMATOCRIT, BLOOD
HCT: 22 % — ABNORMAL LOW (ref 39.0–52.0)
Hemoglobin: 7.4 g/dL — ABNORMAL LOW (ref 13.0–17.0)

## 2020-03-17 MED ORDER — SODIUM CHLORIDE 0.9 % IV SOLN
2.0000 g | Freq: Three times a day (TID) | INTRAVENOUS | Status: DC
  Administered 2020-03-17: 2 g via INTRAVENOUS
  Filled 2020-03-17 (×2): qty 2

## 2020-03-17 MED ORDER — POTASSIUM CHLORIDE CRYS ER 20 MEQ PO TBCR
40.0000 meq | EXTENDED_RELEASE_TABLET | ORAL | Status: AC
  Administered 2020-03-17 (×2): 40 meq via ORAL
  Filled 2020-03-17 (×3): qty 2

## 2020-03-17 MED ORDER — MAGNESIUM SULFATE IN D5W 1-5 GM/100ML-% IV SOLN
1.0000 g | Freq: Once | INTRAVENOUS | Status: AC
  Administered 2020-03-17: 1 g via INTRAVENOUS
  Filled 2020-03-17: qty 100

## 2020-03-17 MED ORDER — ADULT MULTIVITAMIN W/MINERALS CH: 1.0000 | ORAL_TABLET | Freq: Every day | ORAL | Status: AC

## 2020-03-17 NOTE — Discharge Summary (Signed)
Physician Discharge Summary  Richard Li:[id number] DOB: Nov 06, 1960 DOA: 03/13/2020  PCP: [name], DO  Admit date: 03/13/2020 Discharge date: 03/17/2020  Admitted From: Home Disposition: Home  Recommendations for Outpatient Follow-up:  1. Follow ups as below. 2. Please obtain CBC/BMP/Mag at follow up 3. Please follow up on the following pending results: None  Home Health: None required Equipment/Devices: None required  Discharge Condition: Stable CODE STATUS: Partial code-DNI  Hospital Course: 59 year old male with history of lung cancer on radiation therapy and recent hospitalization for presenting with fever, chills, increased cough and fatigue and admitted for septic shock due to multifocal pneumonia.  CXR revealed progressive right lung airspace disease. CTA chest negative for PE but extensive patchy consolidation and GG opacity bilaterally, and stable looking cavitary perihilar RUL mass.  Started on cefepime, vancomycin and azithromycin.  Blood cultures NGTD.  Patient was continued on cefepime and vancomycin.  Sepsis physiology resolved.  MRSA PCR nonreactive. Vancomycin discontinued after 4 days.    Completed cefepime for 5 days.  He felt well and ready to go home and follow-up with his oncologist and radiation oncology for his lung cancer.  Patient's hemoglobin drifted down a little.  He denies melena or hematochezia.  He is not symptomatic.  I offered him blood transfusion but he declined.  Anemia panel consistent with anemia of chronic disease.  Recheck CBC at follow-up, and transfuse as appropriate    Discharge Diagnoses:  Septic shock due to multifocal pneumonia: POA.  BP 78/53 on admission.  CTA chest negative for PE but extensive patchy consolidation and GG opacity bilaterally, and stable looking cavitary perihilar RUL mass.  Blood and urine cultures NGTD.  MRSA PCR negative.  Sepsis physiology resolved.  Breathing improved. -Azithromycin on 11/19.   Vancomycin 11/19-11/22. -IV cefepime 11/19 through 11/23  Non-small cell lung cancer, adenocarcinoma: Started chemoradiation on 02/10/2020.S/p 4cycles.  Followed by Dr. Julien Nordmann and rad Julio Alm. -Outpatient follow-up with oncology and radiation oncology after treatment for pneumonia  Normocytic anemia: Likely anemia of chronic disease.  Baseline Hgb 9-11>> 8.9 (admit)>> 8.0> 7.7>7.4.  Likely combination of dilution from IV fluid and some blood loss from blood draws. Denies GI bleed.  He is not symptomatic from this. Anemia panel on 11/11 consistent with anemia of chronic disease.  Folic acid 5.2.  Declined blood transfusion. -Continue folic acid -Recheck CBC in 1 week  Hypokalemia/hypomagnesemia/hypophosphatemia:  Replenished prior to discharge. -Recheck in 1 week  Anxiety/insomnia: -Continue home Ativan and trazodone  Bilateral arm swelling: Likely from IV fluid. -Discontinued IV fluid   Body mass index is 29.35 kg/m. Nutrition Problem: Increased nutrient needs Etiology: cancer and cancer related treatments Signs/Symptoms: estimated needs Interventions: Refer to RD note for recommendations, MVI      Discharge Exam: Vitals:   03/16/20 2018 03/17/20 0444  BP: 122/67 137/64  Pulse: 78 74  Resp: 18 20  Temp: 98.1 F (36.7 C) 98.2 F (36.8 C)  SpO2: 100% 97%    GENERAL: No apparent distress.  Nontoxic. HEENT: MMM.  Vision and hearing grossly intact.  NECK: Supple.  No apparent JVD.  RESP:  No IWOB.  Fair aeration bilaterally. CVS:  RRR. Heart sounds normal.  ABD/GI/GU: Bowel sounds present. Soft. Non tender.  MSK/EXT:  Moves extremities. No apparent deformity. No edema.  SKIN: no apparent skin lesion or wound NEURO: Awake, alert and oriented appropriately.  No apparent focal neuro deficit. PSYCH: Calm. Normal affect.  Discharge Instructions  Discharge Instructions    Call MD for:  difficulty breathing, headache or visual disturbances   Complete by: As directed     Call MD for:  extreme fatigue   Complete by: As directed    Call MD for:  persistant dizziness or light-headedness   Complete by: As directed    Call MD for:  temperature >100.4   Complete by: As directed    Diet general   Complete by: As directed    Discharge instructions   Complete by: As directed    It has been a pleasure taking care of you!  You were hospitalized due to pneumonia.  You were treated with antibiotics and your symptoms improved.  Please follow-up with your oncologist and primary care doctor in 1 to 2 weeks or sooner if needed.   Take care,   Increase activity slowly   Complete by: As directed      Allergies as of 03/17/2020   No Known Allergies     Medication List    STOP taking these medications   doxycycline 100 MG tablet Commonly known as: VIBRA-TABS   meclizine 25 MG tablet Commonly known as: ANTIVERT   sildenafil 100 MG tablet Commonly known as: Viagra   sucralfate 1 g tablet Commonly known as: Carafate     TAKE these medications   acetaminophen 500 MG tablet Commonly known as: TYLENOL Take 1,000 mg by mouth every 6 (six) hours as needed for mild pain.   Combivent Respimat 20-100 MCG/ACT Aers respimat Generic drug: Ipratropium-Albuterol Inhale 1-2 puffs into the lungs every 6 (six) hours as needed for wheezing.   fenofibrate 160 MG tablet Take 1 tablet (160 mg total) by mouth daily.   folic acid 1 MG tablet Commonly known as: FOLVITE Take 1 tablet (1 mg total) by mouth daily.   guaiFENesin 600 MG 12 hr tablet Commonly known as: MUCINEX Take 600 mg by mouth daily as needed for cough.   HYDROcodone-homatropine 5-1.5 MG/5ML syrup Commonly known as: HYCODAN TAKE 5 MLS BY MOUTH EVERY 6 HOURS AS NEEDED FOR COUGH What changed: See the new instructions.   LORazepam 1 MG tablet Commonly known as: ATIVAN TAKE 1 TABLET BY MOUTH THREE TIMES DAILY AS NEEDED FOR ANXIETY What changed:   how much to take  how to take this  when to  take this  additional instructions   losartan 100 MG tablet Commonly known as: COZAAR Take 1 tablet (100 mg total) by mouth daily.   LUMIFY OP Place 1 drop into both eyes daily as needed (redness).   multivitamin with minerals Tabs tablet Take 1 tablet by mouth daily. Start taking on: March 18, 2020   potassium chloride SA 20 MEQ tablet Commonly known as: KLOR-CON Take 1 tablet (20 mEq total) by mouth daily.   prochlorperazine 10 MG tablet Commonly known as: COMPAZINE Take 1 tablet (10 mg total) by mouth every 6 (six) hours as needed for nausea or vomiting.   traZODone 50 MG tablet Commonly known as: DESYREL TAKE 1 TO 2 TABLETS BY MOUTH AT BEDTIME AS NEEDED FOR SLEEP What changed:   how much to take  when to take this  additional instructions       Consultations:  None  Procedures/Studies:   DG Chest 1 View  Result Date: 03/05/2020 CLINICAL DATA:  History of lung cancer. Shortness of breath, fever and weakness. EXAM: CHEST  1 VIEW COMPARISON:  01/07/2020 FINDINGS: Heart size is normal. Left lung is clear. Pulmonary infiltrate affecting the right lower lobe and upper lobe consistent with  bronchopneumonia. Cannot rule out an element of radiation affect or interstitial spread of tumor, but due to the extensive nature of the findings, infectious pneumonia seems likely. Small amount of pleural fluid on the right. IMPRESSION: Extensive infiltrate in the right upper and lower lobes consistent with infectious pneumonia. Small amount of pleural fluid on the right. Electronically Signed   By: Nelson Chimes M.D.   On: 03/05/2020 11:44   CT Angio Chest PE W/Cm &/Or Wo Cm  Result Date: 03/13/2020 CLINICAL DATA:  Fever. Hypotension. Anemia. Lung cancer. Ongoing radiation therapy and chemotherapy. EXAM: CT ANGIOGRAPHY CHEST WITH CONTRAST TECHNIQUE: Multidetector CT imaging of the chest was performed using the standard protocol during bolus administration of intravenous  contrast. Multiplanar CT image reconstructions and MIPs were obtained to evaluate the vascular anatomy. CONTRAST:  8mL OMNIPAQUE IOHEXOL 350 MG/ML SOLN COMPARISON:  Chest radiograph from earlier today. 01/16/2020 PET-CT. 12/20/2019 chest CT. FINDINGS: Cardiovascular: The study is moderate quality for the evaluation of pulmonary embolism, with some motion degradation. There are no convincing filling defects in the central, lobar, segmental or subsegmental pulmonary artery branches to suggest acute pulmonary embolism. Atherosclerotic nonaneurysmal thoracic aorta. Stable top-normal caliber main pulmonary artery (3.0 cm diameter). Normal heart size. Stable trace pericardial effusion/thickening. Mediastinum/Nodes: No discrete thyroid nodules. Unremarkable esophagus. No axillary adenopathy. Mildly enlarged 1.2 cm subcarinal node (series 4/image 75), new. Mildly enlarged 1.0 cm right hilar node (series 4/image 71) new. No left hilar adenopathy. Lungs/Pleura: No pneumothorax. Small dependent bilateral pleural effusions, new bilaterally. Moderate centrilobular and paraseptal emphysema with diffuse bronchial wall thickening and saber sheath trachea. Extensive patchy consolidation and ground-glass opacity throughout the right lung involving all right lung lobes with associated air bronchograms, new since 01/16/2020 PET-CT, obscuring the margins of the cavitary right upper perihilar lung mass, which measures approximately 4.2 x 3.0 cm (series 10/image 58), not substantially changed since 01/16/2020 PET-CT. New patchy peribronchovascular irregular foci of consolidation and ground-glass opacity throughout left upper lobe. Stable solid 0.6 cm posterior left upper lobe pulmonary nodule (series 10/image 88). Upper abdomen: No acute abnormality. Musculoskeletal:  No aggressive appearing focal osseous lesions. Review of the MIP images confirms the above findings. IMPRESSION: 1. No evidence of acute pulmonary embolism. 2. Extensive  patchy consolidation and ground-glass opacity throughout the right greater than left lungs, new since 01/16/2020 PET-CT. Findings favor multilobar pneumonia, with differential including drug reaction. A portion of the right lung opacities may represent early post treatment change. Close chest CT follow-up recommended. 3. Cavitary perihilar right upper lung mass is obscured and is grossly stable. Stable subcentimeter solid left lower lobe pulmonary nodule. 4. New small dependent bilateral pleural effusions. 5. New mild subcarinal and right hilar lymphadenopathy, nonspecific, suggest attention on follow-up chest CT with IV contrast in 3 months. 6. Aortic Atherosclerosis (ICD10-I70.0) and Emphysema (ICD10-J43.9). Electronically Signed   By: Ilona Sorrel M.D.   On: 03/13/2020 14:26   DG Chest Port 1 View  Result Date: 03/13/2020 CLINICAL DATA:  Hypotension and fever.  Possible sepsis. EXAM: PORTABLE CHEST 1 VIEW COMPARISON:  Radiographs 03/05/2020 and 01/07/2020. CT 12/20/2019 and PET-CT 01/16/2020. FINDINGS: 1111 hours. The heart size and mediastinal contours are stable without gross adenopathy. There is progressive airspace disease throughout the right lung with mild volume loss. This obscures the previously demonstrated cavitary lesion in the right upper lobe. The left lung is clear. There is no pleural effusion or pneumothorax. No acute osseous findings are seen. IMPRESSION: Progressive right lung airspace disease suspicious for pneumonia in  this clinical setting. In this patient with recently diagnosed lung cancer, these findings could relate to radiation therapy. Correlate clinically. Electronically Signed   By: Richardean Sale M.D.   On: 03/13/2020 11:43       The results of significant diagnostics from this hospitalization (including imaging, microbiology, ancillary and laboratory) are listed below for reference.     Microbiology: Recent Results (from the past 240 hour(s))  Blood Culture (routine  x 2)     Status: None (Preliminary result)   Collection Time: 03/13/20 10:43 AM   Specimen: BLOOD  Result Value Ref Range Status   Specimen Description   Final    BLOOD LEFT ARM Performed at [facility] [address] [place], [place] [address]    Special Requests   Final    BOTTLES DRAWN AEROBIC AND ANAEROBIC Blood Culture results may not be optimal due to an inadequate volume of blood received in culture bottles Performed at [facility] [address] [place], [place] [address]    Culture   Final    NO GROWTH 4 DAYS Performed at [facility], [place] [address], [place], [place] [address]    Report Status PENDING  Incomplete  Blood Culture (routine x 2)     Status: None (Preliminary result)   Collection Time: 03/13/20 10:48 AM   Specimen: BLOOD  Result Value Ref Range Status   Specimen Description   Final    BLOOD LEFT ARM Performed at [facility] [address], [place], [place] [address]    Special Requests   Final    BOTTLES DRAWN AEROBIC AND ANAEROBIC Blood Culture adequate volume Performed at [facility] [address], [place], [place] [address]    Culture   Final    NO GROWTH 4 DAYS Performed at [facility], [place] [address], [place], [place] [address]    Report Status PENDING  Incomplete  Resp Panel by RT-PCR (Flu A&B, Covid) Nasopharyngeal Swab     Status: None   Collection Time: 03/13/20 11:08 AM   Specimen: Nasopharyngeal Swab; Nasopharyngeal(NP) swabs in vial transport medium  Result Value Ref Range Status   SARS Coronavirus 2 by RT PCR NEGATIVE NEGATIVE Final    Comment: (NOTE) SARS-CoV-2 target nucleic acids are NOT DETECTED.  The SARS-CoV-2 RNA is generally detectable in upper respiratory specimens during the acute phase of infection. The lowest concentration of SARS-CoV-2 viral copies this assay can detect is 138 copies/mL. A negative result does not preclude  SARS-Cov-2 infection and should not be used as the sole basis for treatment or other patient management decisions. A negative result may occur with  improper specimen collection/handling, submission of specimen other than nasopharyngeal swab, presence of viral mutation(s) within the areas targeted by this assay, and inadequate number of viral copies(<138 copies/mL). A negative result must be combined with clinical observations, patient history, and epidemiological information. The expected result is Negative.  Fact Sheet for Patients:  EntrepreneurPulse.com.au  Fact Sheet for Healthcare Providers:  IncredibleEmployment.be  This test is no t yet approved or cleared by the [place] FDA and  has been authorized for detection and/or diagnosis of SARS-CoV-2 by FDA under an Emergency Use Authorization (EUA). This EUA will remain  in effect (meaning this test can be used) for the duration of the COVID-19 declaration under Section 564(b)(1) of the Act, 21 U.S.C.section 360bbb-3(b)(1), unless the authorization is terminated  or revoked sooner.  Influenza A by PCR NEGATIVE NEGATIVE Final   Influenza B by PCR NEGATIVE NEGATIVE Final    Comment: (NOTE) The Xpert Xpress SARS-CoV-2/FLU/RSV plus assay is intended as an aid in the diagnosis of influenza from Nasopharyngeal swab specimens and should not be used as a sole basis for treatment. Nasal washings and aspirates are unacceptable for Xpert Xpress SARS-CoV-2/FLU/RSV testing.  Fact Sheet for Patients: EntrepreneurPulse.com.au  Fact Sheet for Healthcare Providers: IncredibleEmployment.be  This test is not yet approved or cleared by the [place] FDA and has been authorized for detection and/or diagnosis of SARS-CoV-2 by FDA under an Emergency Use Authorization (EUA). This EUA will remain in effect (meaning this test can be used) for the duration of  the COVID-19 declaration under Section 564(b)(1) of the Act, 21 U.S.C. section 360bbb-3(b)(1), unless the authorization is terminated or revoked.  Performed at [facility], [place] [address], [place], [place] [address]   Urine culture     Status: None   Collection Time: 03/13/20  2:00 PM   Specimen: In/Out Cath Urine  Result Value Ref Range Status   Specimen Description   Final    IN/OUT CATH URINE Performed at [facility] [address] [place], [place] [address]    Special Requests   Final    NONE Performed at [facility], [place] [address], [place], [place] [address]    Culture   Final    NO GROWTH Performed at [facility], [place] [address], [place], [place] [address]    Report Status 03/15/2020 FINAL  Final  MRSA PCR Screening     Status: None   Collection Time: 03/15/20  9:59 AM   Specimen: Nasopharyngeal  Result Value Ref Range Status   MRSA by PCR NEGATIVE NEGATIVE Final    Comment:        The GeneXpert MRSA Assay (FDA approved for NASAL specimens only), is one component of a comprehensive MRSA colonization surveillance program. It is not intended to diagnose MRSA infection nor to guide or monitor treatment for MRSA infections. Performed at [facility], [place] [address], [place], [place] [address]      Labs: BNP (last 3 results) No results for input(s): BNP in the last 8760 hours. Basic Metabolic Panel: Recent Labs  Lab 03/13/20 1043 03/13/20 1043 03/13/20 1445 03/13/20 1923 03/14/20 0846 03/15/20 0529 03/16/20 0533 03/17/20 0526  NA 132*  --   --   --  137 137 137 137  K 2.5*  --   --   --  3.5 3.1* 3.2* 3.2*  CL 92*  --   --   --  102 103 103 103  CO2 29  --   --   --  27 25 25 27   GLUCOSE 111*  --   --   --  130* 132* 96 92  BUN 11  --   --   --  11 12 10 10   CREATININE 0.91   < >  --  0.60* 0.60* 0.57* 0.61 0.66  CALCIUM 8.3*  --   --   --  7.5* 7.4* 7.9* 7.9*   MG  --   --  1.8  --   --  1.6* 2.0 1.8  PHOS  --   --   --   --   --   --  1.6* 1.9*   < > = values in this interval not displayed.   Liver Function Tests: Recent Labs  Lab 03/13/20 1043 03/14/20 0846 03/15/20 0529 03/16/20 0533 03/17/20 0526  AST 45* 26 41  --   --   ALT 24 19 24   --   --   ALKPHOS 84 69 62  --   --   BILITOT 1.1 1.0 0.5  --   --   PROT 6.1* 5.0* 4.6*  --   --   ALBUMIN 2.5* 2.0* 1.8* 2.0* 2.0*   No results for input(s): LIPASE, AMYLASE in the last 168 hours. No results for input(s): AMMONIA in the last 168 hours. CBC: Recent Labs  Lab 03/13/20 1043 03/13/20 1043 03/13/20 1923 03/14/20 0846 03/15/20 0529 03/16/20 0533 03/17/20 0526  WBC 4.5  --  4.8 4.6 6.5 5.2  --   NEUTROABS 3.3  --   --   --   --   --   --   HGB 8.9*   < > 8.1* 8.7* 8.0* 7.7* 7.4*  HCT 25.8*   < > 23.2* 25.5* 23.1* 22.9* 22.0*  MCV 87.8  --  87.9 88.2 87.8 89.5  --   PLT 191  --  188 196 219 239  --    < > = values in this interval not displayed.   Cardiac Enzymes: No results for input(s): CKTOTAL, CKMB, CKMBINDEX, TROPONINI in the last 168 hours. BNP: Invalid input(s): POCBNP CBG: No results for input(s): GLUCAP in the last 168 hours. D-Dimer No results for input(s): DDIMER in the last 72 hours. Hgb A1c No results for input(s): HGBA1C in the last 72 hours. Lipid Profile No results for input(s): CHOL, HDL, LDLCALC, TRIG, CHOLHDL, LDLDIRECT in the last 72 hours. Thyroid function studies No results for input(s): TSH, T4TOTAL, T3FREE, THYROIDAB in the last 72 hours.  Invalid input(s): FREET3 Anemia work up No results for input(s): VITAMINB12, FOLATE, FERRITIN, TIBC, IRON, RETICCTPCT in the last 72 hours. Urinalysis    Component Value Date/Time   COLORURINE YELLOW 03/13/2020 1400   APPEARANCEUR CLEAR 03/13/2020 1400   LABSPEC 1.005 03/13/2020 1400   PHURINE 6.0 03/13/2020 1400   GLUCOSEU NEGATIVE 03/13/2020 1400   GLUCOSEU NEGATIVE 12/12/2011 0944   HGBUR NEGATIVE  03/13/2020 1400   BILIRUBINUR NEGATIVE 03/13/2020 1400   KETONESUR NEGATIVE 03/13/2020 1400   PROTEINUR NEGATIVE 03/13/2020 1400   UROBILINOGEN 0.2 12/12/2011 0944   NITRITE NEGATIVE 03/13/2020 1400   LEUKOCYTESUR NEGATIVE 03/13/2020 1400   Sepsis Labs Invalid input(s): PROCALCITONIN,  WBC,  LACTICIDVEN   Time coordinating discharge: 35 minutes  SIGNED:  Mercy Riding, MD  [facility] 03/17/2020, 1:59 PM  If 7PM-7AM, please contact night-coverage [email or web address]

## 2020-03-17 NOTE — Progress Notes (Signed)
Discharge instructions provided and explained to pt and his wife. Pt verbalized understanding and provided teach back. Richard Li

## 2020-03-18 ENCOUNTER — Encounter: Payer: Self-pay | Admitting: Internal Medicine

## 2020-03-18 ENCOUNTER — Ambulatory Visit
Admission: RE | Admit: 2020-03-18 | Discharge: 2020-03-18 | Disposition: A | Discharge status: Home / Self Care | Payer: BC Managed Care – PPO | Source: Ambulatory Visit | Attending: Radiation Oncology | Admitting: Radiation Oncology

## 2020-03-18 ENCOUNTER — Ambulatory Visit: Payer: BC Managed Care – PPO

## 2020-03-18 DIAGNOSIS — Z510 Encounter for antineoplastic radiation therapy: Secondary | ICD-10-CM | POA: Diagnosis not present

## 2020-03-18 DIAGNOSIS — C3411 Malignant neoplasm of upper lobe, right bronchus or lung: Secondary | ICD-10-CM | POA: Diagnosis present

## 2020-03-18 LAB — CULTURE, BLOOD (ROUTINE X 2)
Culture: NO GROWTH
Culture: NO GROWTH
Special Requests: ADEQUATE

## 2020-03-20 ENCOUNTER — Other Ambulatory Visit: Payer: Self-pay

## 2020-03-20 ENCOUNTER — Encounter (HOSPITAL_COMMUNITY): Payer: Self-pay

## 2020-03-20 ENCOUNTER — Inpatient Hospital Stay (HOSPITAL_COMMUNITY)
Admission: EM | Admit: 2020-03-20 | Discharge: 2020-03-24 | DRG: 811 | Disposition: A | Discharge status: Home / Self Care | Payer: BC Managed Care – PPO | Attending: Internal Medicine | Admitting: Internal Medicine

## 2020-03-20 ENCOUNTER — Emergency Department (HOSPITAL_COMMUNITY): Payer: BC Managed Care – PPO

## 2020-03-20 DIAGNOSIS — Z825 Family history of asthma and other chronic lower respiratory diseases: Secondary | ICD-10-CM

## 2020-03-20 DIAGNOSIS — D509 Iron deficiency anemia, unspecified: Secondary | ICD-10-CM | POA: Diagnosis not present

## 2020-03-20 DIAGNOSIS — E8770 Fluid overload, unspecified: Secondary | ICD-10-CM | POA: Diagnosis not present

## 2020-03-20 DIAGNOSIS — F411 Generalized anxiety disorder: Secondary | ICD-10-CM | POA: Diagnosis present

## 2020-03-20 DIAGNOSIS — I10 Essential (primary) hypertension: Secondary | ICD-10-CM | POA: Diagnosis not present

## 2020-03-20 DIAGNOSIS — Z6829 Body mass index (BMI) 29.0-29.9, adult: Secondary | ICD-10-CM

## 2020-03-20 DIAGNOSIS — Z8249 Family history of ischemic heart disease and other diseases of the circulatory system: Secondary | ICD-10-CM

## 2020-03-20 DIAGNOSIS — D649 Anemia, unspecified: Secondary | ICD-10-CM | POA: Diagnosis present

## 2020-03-20 DIAGNOSIS — D638 Anemia in other chronic diseases classified elsewhere: Secondary | ICD-10-CM | POA: Diagnosis present

## 2020-03-20 DIAGNOSIS — J309 Allergic rhinitis, unspecified: Secondary | ICD-10-CM | POA: Diagnosis present

## 2020-03-20 DIAGNOSIS — C3490 Malignant neoplasm of unspecified part of unspecified bronchus or lung: Secondary | ICD-10-CM | POA: Diagnosis present

## 2020-03-20 DIAGNOSIS — J189 Pneumonia, unspecified organism: Secondary | ICD-10-CM | POA: Diagnosis present

## 2020-03-20 DIAGNOSIS — Z87891 Personal history of nicotine dependence: Secondary | ICD-10-CM

## 2020-03-20 DIAGNOSIS — E785 Hyperlipidemia, unspecified: Secondary | ICD-10-CM | POA: Diagnosis present

## 2020-03-20 DIAGNOSIS — J690 Pneumonitis due to inhalation of food and vomit: Secondary | ICD-10-CM | POA: Diagnosis present

## 2020-03-20 DIAGNOSIS — Z79899 Other long term (current) drug therapy: Secondary | ICD-10-CM

## 2020-03-20 DIAGNOSIS — Z8709 Personal history of other diseases of the respiratory system: Secondary | ICD-10-CM

## 2020-03-20 DIAGNOSIS — J449 Chronic obstructive pulmonary disease, unspecified: Secondary | ICD-10-CM | POA: Diagnosis present

## 2020-03-20 DIAGNOSIS — Z20822 Contact with and (suspected) exposure to covid-19: Secondary | ICD-10-CM | POA: Diagnosis present

## 2020-03-20 DIAGNOSIS — D849 Immunodeficiency, unspecified: Secondary | ICD-10-CM | POA: Diagnosis present

## 2020-03-20 DIAGNOSIS — I313 Pericardial effusion (noninflammatory): Secondary | ICD-10-CM | POA: Diagnosis present

## 2020-03-20 DIAGNOSIS — Z87442 Personal history of urinary calculi: Secondary | ICD-10-CM

## 2020-03-20 DIAGNOSIS — R0602 Shortness of breath: Secondary | ICD-10-CM | POA: Diagnosis not present

## 2020-03-20 DIAGNOSIS — E538 Deficiency of other specified B group vitamins: Secondary | ICD-10-CM | POA: Diagnosis present

## 2020-03-20 DIAGNOSIS — E663 Overweight: Secondary | ICD-10-CM | POA: Diagnosis present

## 2020-03-20 DIAGNOSIS — K219 Gastro-esophageal reflux disease without esophagitis: Secondary | ICD-10-CM | POA: Diagnosis present

## 2020-03-20 LAB — BASIC METABOLIC PANEL
Anion gap: 9 (ref 5–15)
BUN: 8 mg/dL (ref 6–20)
CO2: 26 mmol/L (ref 22–32)
Calcium: 8.2 mg/dL — ABNORMAL LOW (ref 8.9–10.3)
Chloride: 100 mmol/L (ref 98–111)
Creatinine, Ser: 0.7 mg/dL (ref 0.61–1.24)
GFR, Estimated: >60 mL/min (ref >60)
Glucose, Bld: 97 mg/dL (ref 70–99)
Potassium: 3.7 mmol/L (ref 3.5–5.1)
Sodium: 135 mmol/L (ref 135–145)

## 2020-03-20 LAB — CBC WITH DIFFERENTIAL/PLATELET
Abs Immature Granulocytes: 0.06 10*3/uL (ref 0.00–0.07)
Basophils Absolute: 0 10*3/uL (ref 0.0–0.1)
Basophils Relative: 0 %
Eosinophils Absolute: 0.1 10*3/uL (ref 0.0–0.5)
Eosinophils Relative: 1 %
HCT: 22 % — ABNORMAL LOW (ref 39.0–52.0)
Hemoglobin: 7.3 g/dL — ABNORMAL LOW (ref 13.0–17.0)
Immature Granulocytes: 1 %
Lymphocytes Relative: 9 %
Lymphs Abs: 0.6 10*3/uL — ABNORMAL LOW (ref 0.7–4.0)
MCH: 30.3 pg (ref 26.0–34.0)
MCHC: 33.2 g/dL (ref 30.0–36.0)
MCV: 91.3 fL (ref 80.0–100.0)
Monocytes Absolute: 0.7 10*3/uL (ref 0.1–1.0)
Monocytes Relative: 10 %
Neutro Abs: 5.1 10*3/uL (ref 1.7–7.7)
Neutrophils Relative %: 79 %
Platelets: 264 10*3/uL (ref 150–400)
RBC: 2.41 MIL/uL — ABNORMAL LOW (ref 4.22–5.81)
RDW: 13.4 % (ref 11.5–15.5)
WBC: 6.5 10*3/uL (ref 4.0–10.5)
nRBC: 0 % (ref 0.0–0.2)

## 2020-03-20 LAB — URINALYSIS, ROUTINE W REFLEX MICROSCOPIC
Bilirubin Urine: NEGATIVE
Glucose, UA: NEGATIVE mg/dL
Hgb urine dipstick: NEGATIVE
Ketones, ur: NEGATIVE mg/dL
Leukocytes,Ua: NEGATIVE
Nitrite: NEGATIVE
Protein, ur: NEGATIVE mg/dL
Specific Gravity, Urine: 1.005 (ref 1.005–1.030)
pH: 8 (ref 5.0–8.0)

## 2020-03-20 LAB — RESP PANEL BY RT-PCR (FLU A&B, COVID) ARPGX2
Influenza A by PCR: NEGATIVE
Influenza B by PCR: NEGATIVE
SARS Coronavirus 2 by RT PCR: NEGATIVE

## 2020-03-20 LAB — LACTIC ACID, PLASMA
Lactic Acid, Venous: 0.9 mmol/L (ref 0.5–1.9)
Lactic Acid, Venous: 0.8 mmol/L (ref 0.5–1.9)

## 2020-03-20 LAB — PREPARE RBC (CROSSMATCH)

## 2020-03-20 LAB — ABO/RH: ABO/RH(D): O POS

## 2020-03-20 LAB — TROPONIN I (HIGH SENSITIVITY)
Troponin I (High Sensitivity): 2 ng/L (ref <18)
Troponin I (High Sensitivity): 3 ng/L (ref <18)

## 2020-03-20 IMAGING — CR DG CHEST 2V
2 series · 2 of 2 positions shown · non-contrast
Comparison: [DATE]

CLINICAL DATA: Shortness of breath. Recent sepsis and
hospitalization.

EXAM:
CHEST - 2 VIEW

[w chest pa]
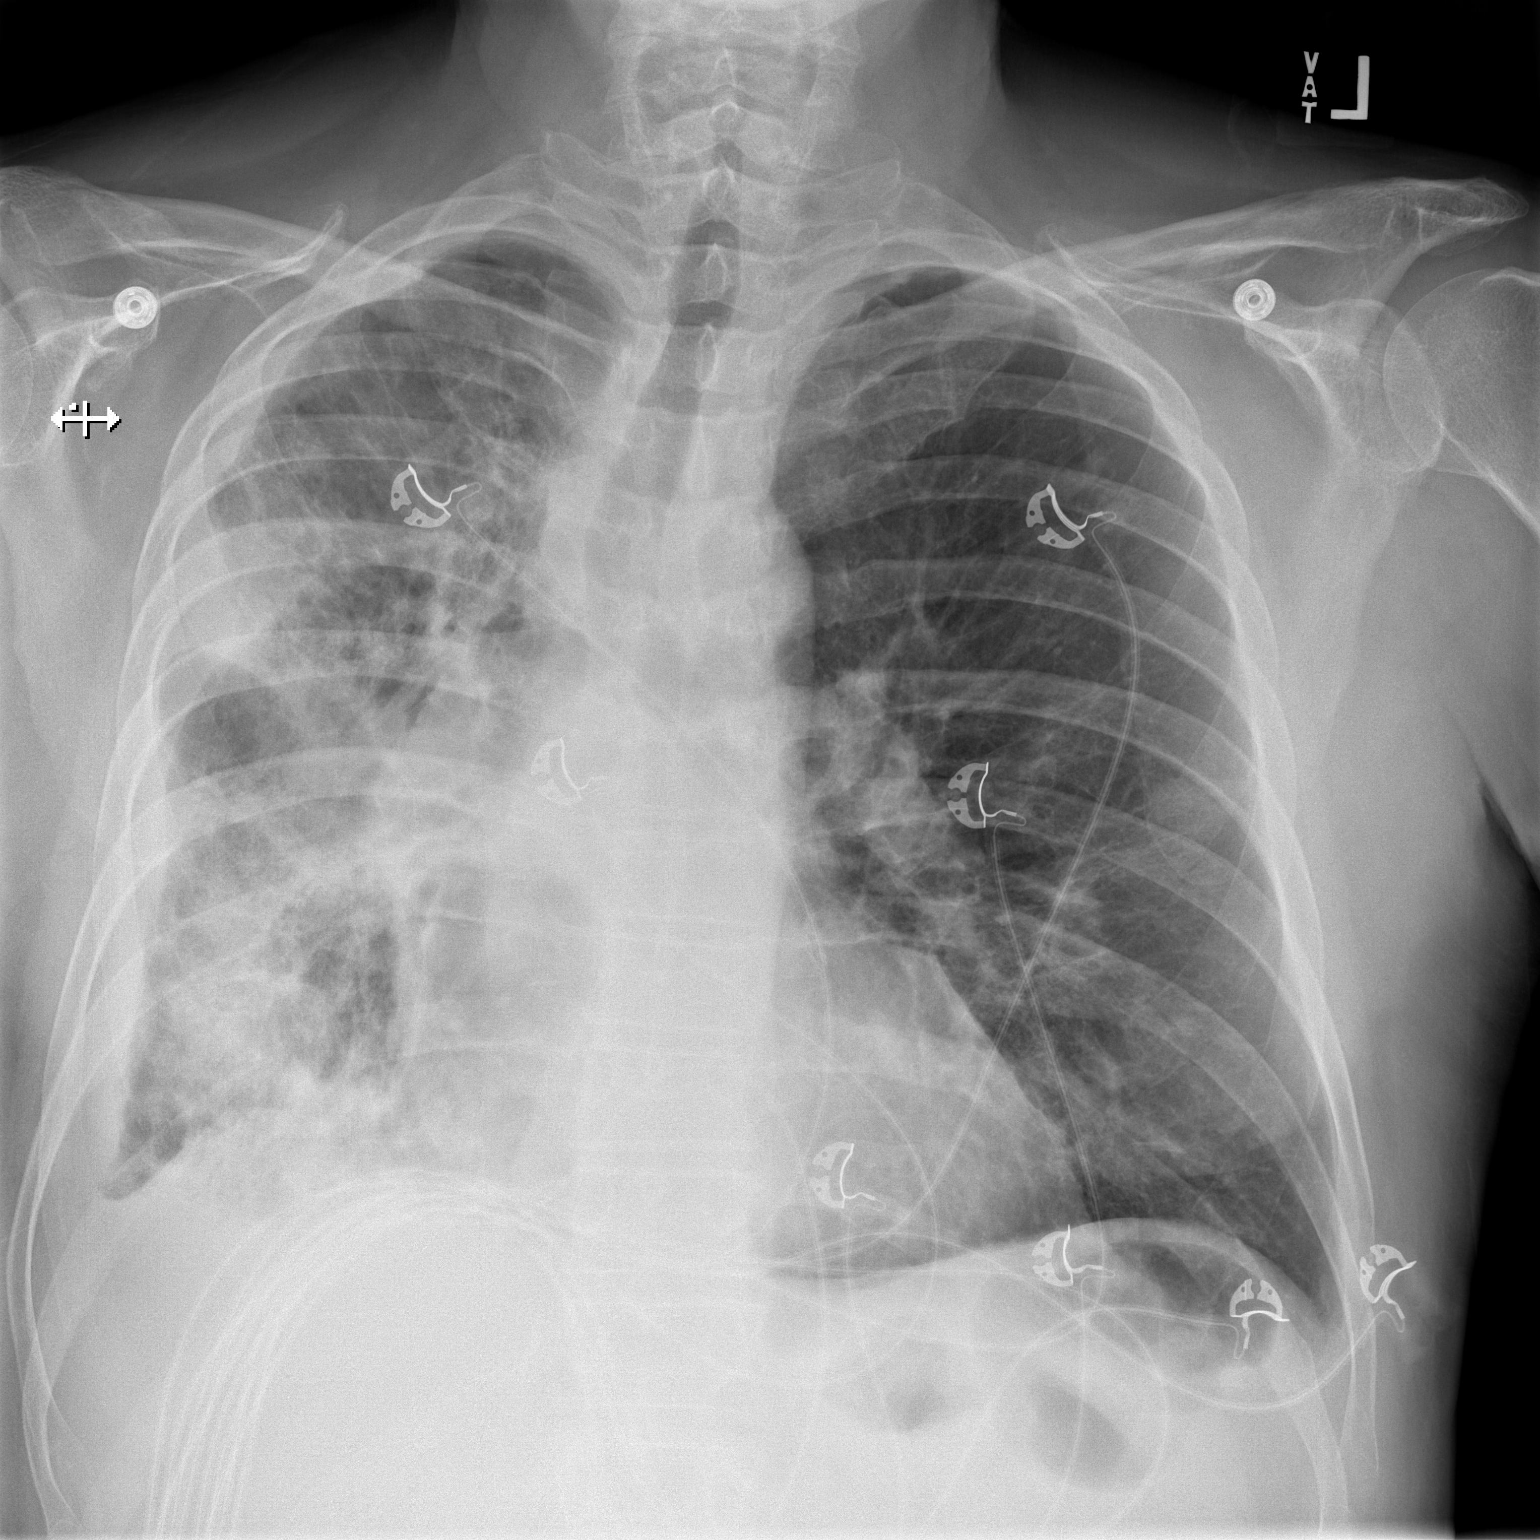

[w chest lat]
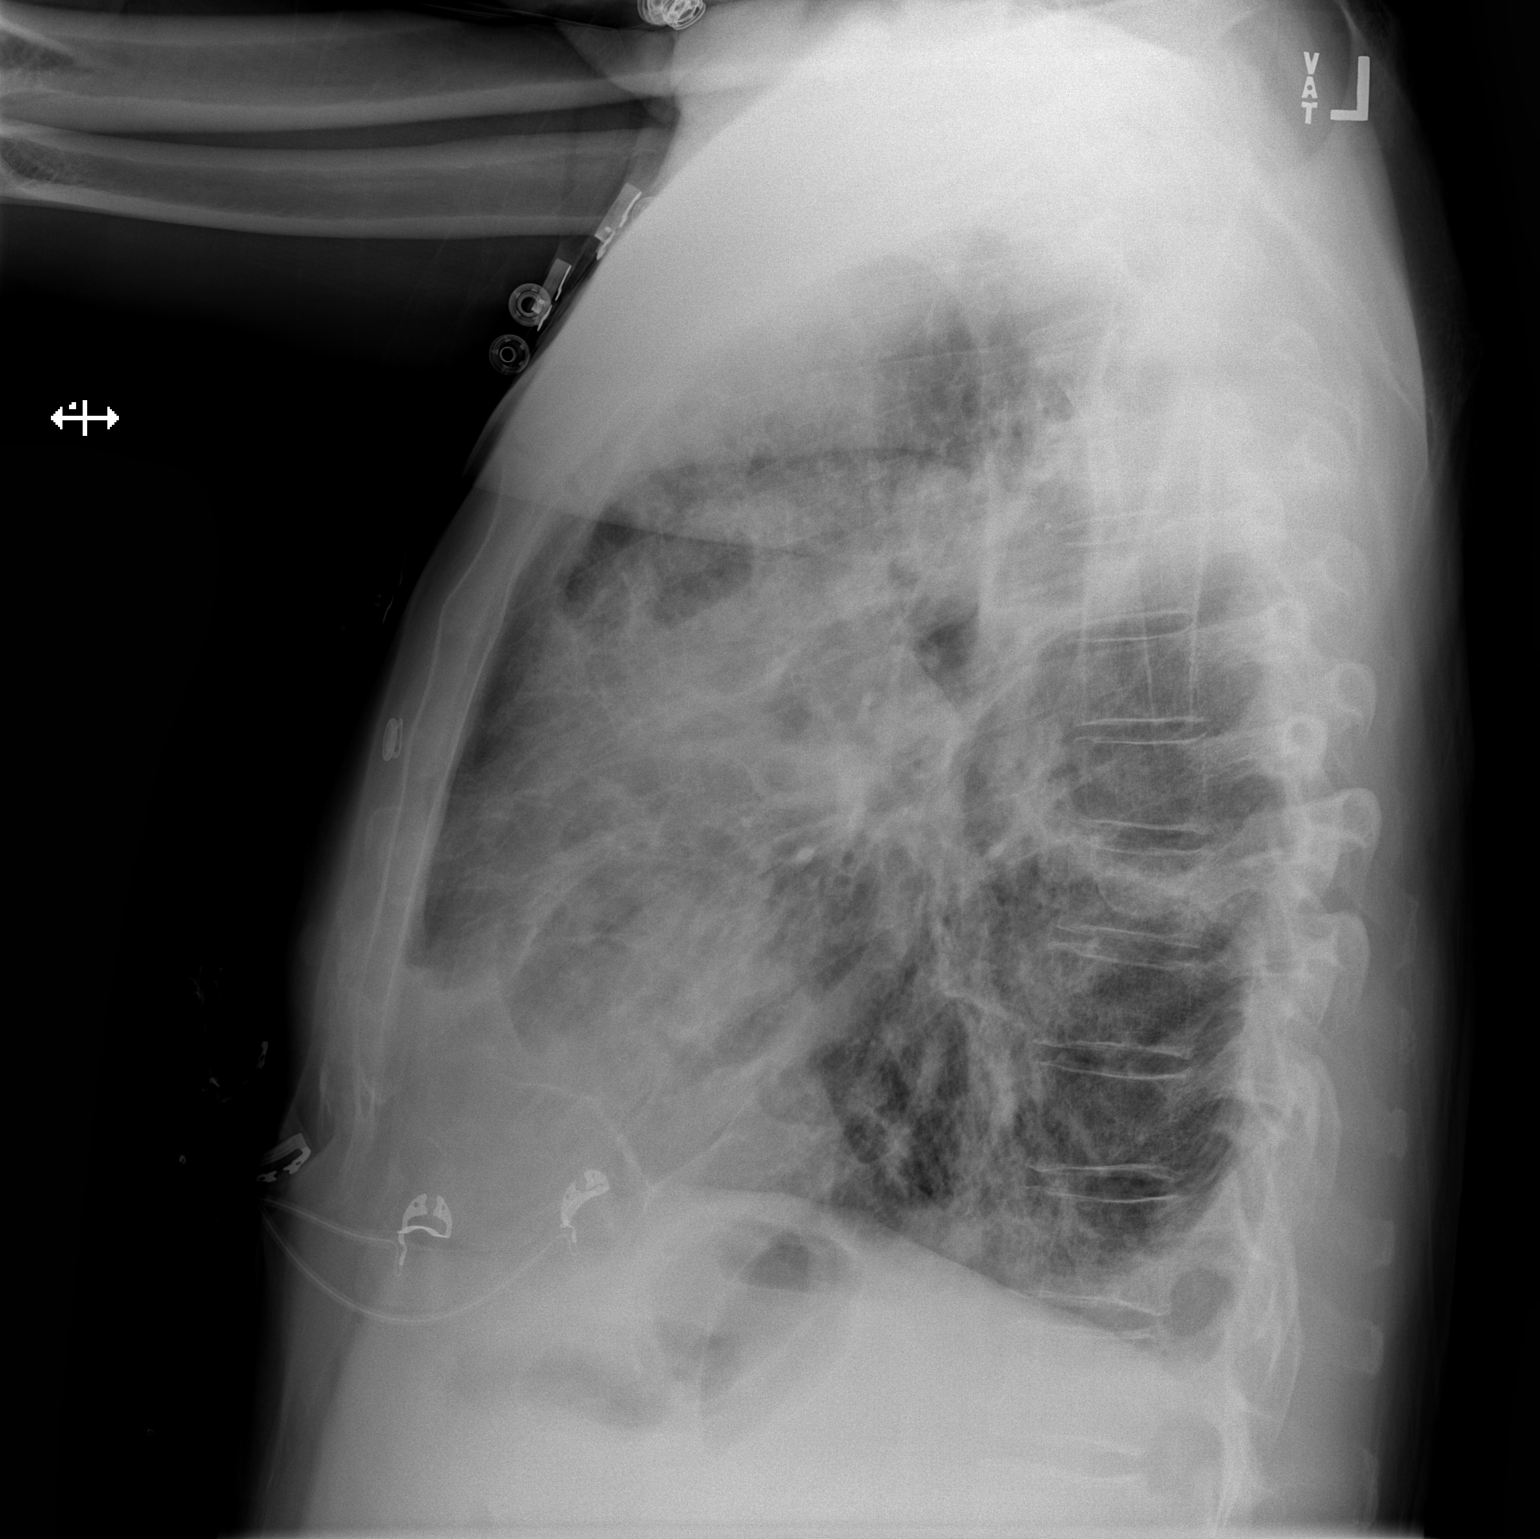

[2 of 2 positions shown; findings below may reference images not displayed]

FINDINGS: Extensive airspace disease throughout the right lung, similar to
prior study. Small right pleural effusion. Patchy nodular opacities
are seen in the left lung as seen on prior CT. Heart is normal size.
No acute bony abnormality.
IMPRESSION: Extensive airspace disease throughout the right lung concerning for
pneumonia. Small right pleural effusion. Findings similar to prior
study.

Patchy vague ground-glass nodular opacities throughout the left lung
as seen on prior CT.

No real change since prior study.

## 2020-03-20 MED ORDER — SODIUM CHLORIDE 0.9 % IV SOLN
1.0000 g | Freq: Once | INTRAVENOUS | Status: AC
  Administered 2020-03-20: 1 g via INTRAVENOUS
  Filled 2020-03-20: qty 10

## 2020-03-20 MED ORDER — ACETAMINOPHEN 325 MG PO TABS
650.0000 mg | ORAL_TABLET | Freq: Four times a day (QID) | ORAL | Status: DC | PRN
  Administered 2020-03-20 – 2020-03-23 (×5): 650 mg via ORAL
  Filled 2020-03-20 (×5): qty 2

## 2020-03-20 MED ORDER — SODIUM CHLORIDE 0.9 % IV SOLN
1.0000 g | INTRAVENOUS | Status: AC
  Administered 2020-03-21 – 2020-03-24 (×4): 1 g via INTRAVENOUS
  Filled 2020-03-20 (×4): qty 1

## 2020-03-20 MED ORDER — IPRATROPIUM-ALBUTEROL 20-100 MCG/ACT IN AERS: 1.0000 | INHALATION_SPRAY | Freq: Four times a day (QID) | RESPIRATORY_TRACT | Status: DC | PRN

## 2020-03-20 MED ORDER — ACETAMINOPHEN 650 MG RE SUPP: 650.0000 mg | Freq: Four times a day (QID) | RECTAL | Status: DC | PRN

## 2020-03-20 MED ORDER — SODIUM CHLORIDE 0.9 % IV SOLN
500.0000 mg | INTRAVENOUS | Status: DC
  Administered 2020-03-21 – 2020-03-22 (×2): 500 mg via INTRAVENOUS
  Filled 2020-03-20 (×2): qty 500

## 2020-03-20 MED ORDER — POLYETHYLENE GLYCOL 3350 17 G PO PACK: 17.0000 g | PACK | Freq: Every day | ORAL | Status: DC | PRN

## 2020-03-20 MED ORDER — FENOFIBRATE 160 MG PO TABS
160.0000 mg | ORAL_TABLET | Freq: Every day | ORAL | Status: DC
  Administered 2020-03-21 – 2020-03-24 (×4): 160 mg via ORAL
  Filled 2020-03-20 (×4): qty 1

## 2020-03-20 MED ORDER — SODIUM CHLORIDE 0.9 % IV SOLN
500.0000 mg | Freq: Once | INTRAVENOUS | Status: AC
  Administered 2020-03-20: 500 mg via INTRAVENOUS
  Filled 2020-03-20: qty 500

## 2020-03-20 MED ORDER — FOLIC ACID 1 MG PO TABS
1.0000 mg | ORAL_TABLET | Freq: Every day | ORAL | Status: DC
  Administered 2020-03-21 – 2020-03-24 (×4): 1 mg via ORAL
  Filled 2020-03-20 (×4): qty 1

## 2020-03-20 MED ORDER — LOSARTAN POTASSIUM 50 MG PO TABS
100.0000 mg | ORAL_TABLET | Freq: Every day | ORAL | Status: DC
  Administered 2020-03-20 – 2020-03-24 (×5): 100 mg via ORAL
  Filled 2020-03-20 (×5): qty 2

## 2020-03-20 MED ORDER — POTASSIUM CHLORIDE CRYS ER 20 MEQ PO TBCR
20.0000 meq | EXTENDED_RELEASE_TABLET | Freq: Every day | ORAL | Status: DC
  Administered 2020-03-21 – 2020-03-24 (×4): 20 meq via ORAL
  Filled 2020-03-20 (×4): qty 1

## 2020-03-20 MED ORDER — ALBUTEROL SULFATE HFA 108 (90 BASE) MCG/ACT IN AERS
2.0000 | INHALATION_SPRAY | Freq: Once | RESPIRATORY_TRACT | Status: AC
  Administered 2020-03-20: 2 via RESPIRATORY_TRACT
  Filled 2020-03-20: qty 6.7

## 2020-03-20 MED ORDER — SODIUM CHLORIDE 0.9% FLUSH
3.0000 mL | Freq: Two times a day (BID) | INTRAVENOUS | Status: DC
  Administered 2020-03-20 – 2020-03-24 (×8): 3 mL via INTRAVENOUS

## 2020-03-20 MED ORDER — SODIUM CHLORIDE 0.9 % IV SOLN
10.0000 mL/h | Freq: Once | INTRAVENOUS | Status: AC
  Administered 2020-03-20: 10 mL/h via INTRAVENOUS

## 2020-03-20 MED ORDER — METHYLPREDNISOLONE SODIUM SUCC 125 MG IJ SOLR
80.0000 mg | Freq: Once | INTRAMUSCULAR | Status: AC
  Administered 2020-03-20: 80 mg via INTRAVENOUS
  Filled 2020-03-20: qty 2

## 2020-03-20 MED ORDER — IPRATROPIUM-ALBUTEROL 0.5-2.5 (3) MG/3ML IN SOLN: 3.0000 mL | Freq: Four times a day (QID) | RESPIRATORY_TRACT | Status: DC | PRN

## 2020-03-20 MED ORDER — FUROSEMIDE 10 MG/ML IJ SOLN
40.0000 mg | Freq: Once | INTRAMUSCULAR | Status: AC
  Administered 2020-03-20: 40 mg via INTRAVENOUS
  Filled 2020-03-20: qty 4

## 2020-03-20 MED ORDER — TRAZODONE HCL 50 MG PO TABS
50.0000 mg | ORAL_TABLET | Freq: Every day | ORAL | Status: DC
  Administered 2020-03-20 – 2020-03-23 (×4): 50 mg via ORAL
  Filled 2020-03-20 (×4): qty 1

## 2020-03-20 MED FILL — Dexamethasone Sodium Phosphate Inj 100 MG/10ML: INTRAMUSCULAR | Qty: 2 | Status: AC

## 2020-03-20 NOTE — H&P (Signed)
History and Physical        Hospital Admission Note Date: 03/20/2020  Patient name: Richard Li Medical record number: [id number] Date of birth: 1960/09/20 Age: 59 y.o. Gender: male  PCP: [name], DO  Patient coming from: home  Chief Complaint    Chief Complaint  Patient presents with  . Fatigue  . Shortness of Breath      HPI:   This is a 59 year old male with past medical history of NSCLC, asthma, hypertension, recent admission for septic shock secondary to multifocal pneumonia from 11/19-11/23 who presented to the ED with persistent shortness of breath, cough and generalized weakness.  At his recent hospitalization he completed 5 days of antibiotic therapy with vancomycin and cefepime and was discharged without any antibiotics.  States that since he has been home he has not felt any better and has had fevers intermittently up to 102 F.  Also has felt short of breath on exertion and weak.  He was offered a blood transfusion at his last hospitalization but declined due to concerns of any adverse reactions and was started on folic acid due to deficiency on his last visit.  Has been taking his folic acid.  Also admits to diffuse swelling since his hospitalization and weight gain but denies any known history of heart failure   ED Course: Afebrile, hemodynamically stable, on room air. Notable Labs: Hb 7.3, COVID-19 and influenza negative, troponin 2. Notable Imaging: CXR with extensive airspace disease throughout the right lung concerning for pneumonia with small right pleural effusion and patchy groundglass nodular opacities throughout the left lung. Patient received albuterol, Solu-Medrol, azithromycin and ceftriaxone.    Vitals:   03/20/20 1600 03/20/20 1703  BP: 136/67 136/70  Pulse: 92 94  Resp: (!) 25 18  Temp:  98.2 F (36.8 C)  SpO2: 94% 96%     Review of  Systems:  Review of Systems  All other systems reviewed and are negative.   Medical/Social/Family History   Past Medical History: Past Medical History:  Diagnosis Date  . Allergic rhinitis, cause unspecified   . Anxiety state, unspecified   . Asthma    as a child  . Chronic airway obstruction, not elsewhere classified   . Esophageal reflux   . History of kidney stones   . Hypertension   . Lumbago   . Other and unspecified hyperlipidemia   . Other chest pain     Past Surgical History:  Procedure Laterality Date  . APPENDECTOMY    . BRONCHIAL BIOPSY  01/07/2020   Procedure: BRONCHIAL BIOPSIES;  Surgeon: Collene Gobble, MD;  Location: [facility] ENDOSCOPY;  Service: Pulmonary;;  . BRONCHIAL BRUSHINGS  01/07/2020   Procedure: BRONCHIAL BRUSHINGS;  Surgeon: Collene Gobble, MD;  Location: [facility] ENDOSCOPY;  Service: Pulmonary;;  . BRONCHIAL NEEDLE ASPIRATION BIOPSY  01/07/2020   Procedure: BRONCHIAL NEEDLE ASPIRATION BIOPSIES;  Surgeon: Collene Gobble, MD;  Location: [facility] ENDOSCOPY;  Service: Pulmonary;;  . BRONCHIAL WASHINGS  01/07/2020   Procedure: BRONCHIAL WASHINGS;  Surgeon: Collene Gobble, MD;  Location: [facility] ENDOSCOPY;  Service: Pulmonary;;  . COLONOSCOPY  15 years ago  . HEMOSTASIS CONTROL  01/07/2020   Procedure: HEMOSTASIS CONTROL;  Surgeon: Baltazar Apo  S, MD;  Location: [place];  Service: Pulmonary;;  cold saline  . NASAL TURBINATE REDUCTION  2002   Dr.Crossley  . VASECTOMY    . VIDEO BRONCHOSCOPY WITH ENDOBRONCHIAL NAVIGATION N/A 01/07/2020   Procedure: VIDEO BRONCHOSCOPY WITH ENDOBRONCHIAL NAVIGATION;  Surgeon: Collene Gobble, MD;  Location: [place] ENDOSCOPY;  Service: Pulmonary;  Laterality: N/A;  . VIDEO BRONCHOSCOPY WITH ENDOBRONCHIAL ULTRASOUND N/A 01/07/2020   Procedure: VIDEO BRONCHOSCOPY WITH ENDOBRONCHIAL ULTRASOUND;  Surgeon: Collene Gobble, MD;  Location: [place] ENDOSCOPY;  Service: Pulmonary;  Laterality: N/A;    Medications: Prior to Admission medications   Medication  Sig Start Date End Date Taking? Authorizing Provider  acetaminophen (TYLENOL) 500 MG tablet Take 1,000 mg by mouth every 6 (six) hours as needed for mild pain.   Yes [provider]  dextromethorphan (DELSYM) 30 MG/5ML liquid Take 30 mg by mouth at bedtime as needed for cough.   Yes [provider]  fenofibrate 160 MG tablet Take 1 tablet (160 mg total) by mouth daily. 04/17/19  Yes [name], DO  folic acid (FOLVITE) 1 MG tablet Take 1 tablet (1 mg total) by mouth daily. 03/08/20  Yes Patrecia Pour, MD  guaiFENesin (MUCINEX) 600 MG 12 hr tablet Take 600 mg by mouth daily as needed for cough.    Yes [provider]  Ipratropium-Albuterol (COMBIVENT RESPIMAT) 20-100 MCG/ACT AERS respimat Inhale 1-2 puffs into the lungs every 6 (six) hours as needed for wheezing. 01/22/20  Yes Zigmund Daniel, Cody, DO  LORazepam (ATIVAN) 1 MG tablet TAKE 1 TABLET BY MOUTH THREE TIMES DAILY AS NEEDED FOR ANXIETY Patient taking differently: Take 1 mg by mouth every 8 (eight) hours as needed for anxiety.  01/27/20  Yes [name], DO  losartan (COZAAR) 100 MG tablet Take 1 tablet (100 mg total) by mouth daily. 04/17/19  Yes [name], DO  Multiple Vitamin (MULTIVITAMIN WITH MINERALS) TABS tablet Take 1 tablet by mouth daily. 03/18/20  Yes Mercy Riding, MD  potassium chloride SA (KLOR-CON) 20 MEQ tablet Take 1 tablet (20 mEq total) by mouth daily. 03/09/20  Yes Curt Bears, MD  traZODone (DESYREL) 50 MG tablet TAKE 1 TO 2 TABLETS BY MOUTH AT BEDTIME AS NEEDED FOR SLEEP Patient taking differently: Take 50 mg by mouth at bedtime.  01/28/20  Yes Matthews, Cody, DO  HYDROcodone-homatropine (HYCODAN) 5-1.5 MG/5ML syrup TAKE 5 MLS BY MOUTH EVERY 6 HOURS AS NEEDED FOR COUGH Patient not taking: Reported on 03/20/2020 03/05/20   Curt Bears, MD  prochlorperazine (COMPAZINE) 10 MG tablet Take 1 tablet (10 mg total) by mouth every 6 (six) hours as needed for nausea or vomiting. Patient not  taking: Reported on 03/05/2020 01/30/20   Curt Bears, MD    Allergies:  No Known Allergies  Social History:  reports that he quit smoking about 14 years ago. His smoking use included cigarettes. He has a 56.00 pack-year smoking history. He has never used smokeless tobacco. He reports current alcohol use of about 12.0 standard drinks of alcohol per week. He reports that he does not use drugs.  Family History: Family History  Problem Relation Age of Onset  . Breast cancer Mother   . Cancer Father   . Heart attack Brother   . Emphysema Maternal Uncle   . COPD Maternal Uncle   . Heart disease Maternal Uncle   . Colon cancer Neg Hx      Objective   Physical Exam: Blood pressure 136/70, pulse 94, temperature 98.2 F (36.8  C), temperature source Oral, resp. rate 18, height 5' 10.5" (1.791 m), weight 94.1 kg, SpO2 96 %.  Physical Exam Vitals and nursing note reviewed.  Constitutional:      Appearance: He is ill-appearing.  HENT:     Head: Normocephalic.  Cardiovascular:     Rate and Rhythm: Normal rate and regular rhythm.  Pulmonary:     Effort: Pulmonary effort is normal.     Breath sounds: Wheezing and rales present.  Musculoskeletal:     Comments: Generalized edema  Skin:    Coloration: Skin is pale.  Neurological:     General: No focal deficit present.     Mental Status: He is alert.  Psychiatric:        Mood and Affect: Mood normal.        Behavior: Behavior normal.     LABS on Admission: I have personally reviewed all the labs and imaging below    Basic Metabolic Panel: Recent Labs  Lab 03/17/20 0526 03/20/20 1050  NA 137 135  K 3.2* 3.7  CL 103 100  CO2 27 26  GLUCOSE 92 97  BUN 10 8  CREATININE 0.66 0.70  CALCIUM 7.9* 8.2*  MG 1.8  --   PHOS 1.9*  --    Liver Function Tests: Recent Labs  Lab 03/14/20 0846 03/14/20 0846 03/15/20 0529 03/15/20 0529 03/16/20 0533 03/17/20 0526  AST 26  --  41  --   --   --   ALT 19  --  24  --   --    --   ALKPHOS 69  --  62  --   --   --   BILITOT 1.0  --  0.5  --   --   --   PROT 5.0*  --  4.6*  --   --   --   ALBUMIN 2.0*   < > 1.8*   < > 2.0* 2.0*   < > = values in this interval not displayed.   No results for input(s): LIPASE, AMYLASE in the last 168 hours. No results for input(s): AMMONIA in the last 168 hours. CBC: Recent Labs  Lab 03/16/20 0533 03/16/20 0533 03/17/20 0526 03/20/20 1050  WBC 5.2  --   --  6.5  NEUTROABS  --   --   --  5.1  HGB 7.7*   < > 7.4* 7.3*  HCT 22.9*   < > 22.0* 22.0*  MCV 89.5   < >  --  91.3  PLT 239  --   --  264   < > = values in this interval not displayed.   Cardiac Enzymes: No results for input(s): CKTOTAL, CKMB, CKMBINDEX, TROPONINI in the last 168 hours. BNP: Invalid input(s): POCBNP CBG: No results for input(s): GLUCAP in the last 168 hours.  Radiological Exams on Admission:  DG Chest 2 View  Result Date: 03/20/2020 CLINICAL DATA:  Shortness of breath. Recent sepsis and hospitalization. EXAM: CHEST - 2 VIEW COMPARISON:  03/13/2020 FINDINGS: Extensive airspace disease throughout the right lung, similar to prior study. Small right pleural effusion. Patchy nodular opacities are seen in the left lung as seen on prior CT. Heart is normal size. No acute bony abnormality. IMPRESSION: Extensive airspace disease throughout the right lung concerning for pneumonia. Small right pleural effusion. Findings similar to prior study. Patchy vague ground-glass nodular opacities throughout the left lung as seen on prior CT. No real change since prior study. Electronically Signed   By: Lennette Bihari  Dover M.D.   On: 03/20/2020 10:42      EKG: Independently reviewed   A & P   Principal Problem:   Symptomatic anemia Active Problems:   Essential hypertension   CAP (community acquired pneumonia)   Volume overload   1. Symptomatic anemia a. Hb slowly trending downward without bleeding source, currently 7.3 b. Recent iron studies showing elevated  ferritin likely acute phase reactant and with folic acid deficiency c. Continue folic acid d. Transfuse 1 unit PRBCs, risks/benefits were discussed with the patient and agreed to transfusion e. Lasix with transfusion  2. Community-acquired pneumonia a. Completed 5 days vancomycin and cefepime as last visit but still persistent symptoms and fevers at home b. Continue ceftriaxone and azithromycin  3. Volume overload a. Lasix 40 mg IV x 1 b. Echo c. Daily weights and intake/output  4. Hypertension a. Continue home meds    DVT prophylaxis: SCDs   Code Status: Full Code  Diet: Heart healthy Family Communication: Admission, patients condition and plan of care including tests being ordered have been discussed with the patient who indicates understanding and agrees with the plan and Code Status. Patient's wife was updated  Disposition Plan: The appropriate patient status for this patient is OBSERVATION. Observation status is judged to be reasonable and necessary in order to provide the required intensity of service to ensure the patient's safety. The patient's presenting symptoms, physical exam findings, and initial radiographic and laboratory data in the context of their medical condition is felt to place them at decreased risk for further clinical deterioration. Furthermore, it is anticipated that the patient will be medically stable for discharge from the hospital within 2 midnights of admission. The following factors support the patient status of observation.   " The patient's presenting symptoms include lethargy and cough. " The physical exam findings include pallor and volume overload. " The initial radiographic and laboratory data are concerning for anemia.    Status is: Observation  The patient remains OBS appropriate and will d/c before 2 midnights. May be able to be discharged on p.o. antibiotics if he is symptomatically improved and volume status is improved Dispo: The patient is  from: Home              Anticipated d/c is to: Home              Anticipated d/c date is: 1 day              Patient currently is not medically stable to d/c.      Consultants  . None  Procedures  . Blood transfusion  Time Spent on Admission: 63 minutes    Harold Hedge, DO [facility]  03/20/2020, 6:40 PM

## 2020-03-20 NOTE — ED Triage Notes (Addendum)
Patient states he was discharged from the hospital 3-4 days ago and was hospitalized with Sepsis.  Patient states the physician wanted him to get a blood transfusion, but he refused.  Hgb 8.0 when last checked.

## 2020-03-20 NOTE — ED Provider Notes (Signed)
[place] DEPT Provider Note   CSN: [id number] Arrival date & time: 03/20/20  1000     History Chief Complaint  Patient presents with  . Fatigue  . Shortness of Breath    Richard Li is a 59 y.o. male past medical history of lung cancer, asthma, hypertension, recent admission for sepsis secondary to pneumonia (11/19-11/32) who presents for evaluation of generalized weakness, shortness of breath and cough.  He was admitted for sepsis secondary to pneumonia.  He was given IV antibiotics.  He was discharged on 03/17/20.  Patient states that since he has been home, he felt like his cough is gotten worse.  He feels like it is spreading across his chest.  He feels like he will get into a coughing fit and feel like he cannot catch his breath.  He states cough is productive of phlegm.  No hemoptysis.  He feels like he also gets short of breath when he gets up and walks around.  He states he is also felt very weak and tired.  He states he feels like he has no energy and feels like he cannot walk around because he is so weak.  He states when he was admitted, he told his hemoglobin was low and they offered him a blood transfusion but he declined.  Patient states he has not had any appetite but has not had any vomiting.  Patient states he has not noted any fever.  He denies any abdominal pain.  He does have history of asthma as a child.  He has has inhalers that he is use but does not use them at home.  The history is provided by the patient.       Past Medical History:  Diagnosis Date  . Allergic rhinitis, cause unspecified   . Anxiety state, unspecified   . Asthma    as a child  . Chronic airway obstruction, not elsewhere classified   . Esophageal reflux   . History of kidney stones   . Hypertension   . Lumbago   . Other and unspecified hyperlipidemia   . Other chest pain     Patient Active Problem List   Diagnosis Date Noted  . Symptomatic anemia  03/20/2020  . Sepsis ([place]) 03/13/2020  . SOB (shortness of breath) 03/06/2020  . COPD with acute exacerbation ([place]) 03/06/2020  . Generalized weakness 03/06/2020  . Acute hyponatremia 03/06/2020  . AKI (acute kidney injury) ([place]) 03/06/2020  . Dehydration 03/05/2020  . CAP (community acquired pneumonia) 03/05/2020  . Encounter for antineoplastic chemotherapy 01/30/2020  . Sinus congestion 01/22/2020  . Adenocarcinoma of right lung, stage 2 ([place]) 01/17/2020  . Pulmonary nodules/lesions, multiple 01/07/2020  . 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 12/21/2019  . Thoracic aortic atherosclerosis ([place]) 12/21/2019  . Erectile dysfunction 11/06/2019  . Fatigue 10/16/2019  . Insomnia 09/10/2019  . BPPV (benign paroxysmal positional vertigo) 08/03/2017  . Physical exam, annual 03/30/2015  . Essential hypertension 06/03/2014  . Overweight 12/25/2012  . Kidney stone 12/25/2012  . GERD 01/27/2010  . Hyperlipidemia 12/11/2009  . GAD (generalized anxiety disorder) 11/17/2007  . Anxiety state 11/17/2007  . Allergic rhinitis 02/26/2007  . 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[place]) 02/26/2007  . ABNORMAL CHEST XRAY 02/26/2007    Past Surgical History:  Procedure Laterality Date  . APPENDECTOMY    . BRONCHIAL BIOPSY  01/07/2020   Procedure: BRONCHIAL BIOPSIES;  Surgeon: Collene Gobble, MD;  Location: [facility] ENDOSCOPY;  Service: Pulmonary;;  . BRONCHIAL BRUSHINGS  01/07/2020   Procedure: BRONCHIAL BRUSHINGS;  Surgeon: Collene Gobble, MD;  Location: [facility] ENDOSCOPY;  Service: Pulmonary;;  . BRONCHIAL NEEDLE ASPIRATION BIOPSY  01/07/2020   Procedure: BRONCHIAL NEEDLE ASPIRATION BIOPSIES;  Surgeon: Collene Gobble, MD;  Location: [facility] ENDOSCOPY;  Service: Pulmonary;;  . BRONCHIAL WASHINGS  01/07/2020   Procedure: BRONCHIAL WASHINGS;  Surgeon: Collene Gobble, MD;  Location: [facility] ENDOSCOPY;  Service: Pulmonary;;  . COLONOSCOPY  15 years ago  . HEMOSTASIS CONTROL  01/07/2020    Procedure: HEMOSTASIS CONTROL;  Surgeon: Collene Gobble, MD;  Location: [facility] ENDOSCOPY;  Service: Pulmonary;;  cold saline  . NASAL TURBINATE REDUCTION  2002   Dr.Crossley  . VASECTOMY    . VIDEO BRONCHOSCOPY WITH ENDOBRONCHIAL NAVIGATION N/A 01/07/2020   Procedure: VIDEO BRONCHOSCOPY WITH ENDOBRONCHIAL NAVIGATION;  Surgeon: Collene Gobble, MD;  Location: [place] ENDOSCOPY;  Service: Pulmonary;  Laterality: N/A;  . VIDEO BRONCHOSCOPY WITH ENDOBRONCHIAL ULTRASOUND N/A 01/07/2020   Procedure: VIDEO BRONCHOSCOPY WITH ENDOBRONCHIAL ULTRASOUND;  Surgeon: Collene Gobble, MD;  Location: [place] ENDOSCOPY;  Service: Pulmonary;  Laterality: N/A;       Family History  Problem Relation Age of Onset  . Breast cancer Mother   . Cancer Father   . Heart attack Brother   . Emphysema Maternal Uncle   . COPD Maternal Uncle   . Heart disease Maternal Uncle   . Colon cancer Neg Hx     Social History   Tobacco Use  . Smoking status: Former Smoker    Packs/day: 2.00    Years: 28.00    Pack years: 56.00    Types: Cigarettes    Quit date: 04/25/2005    Years since quitting: 14.9  . Smokeless tobacco: Never Used  Vaping Use  . Vaping Use: Never used  Substance Use Topics  . Alcohol use: Yes    Alcohol/week: 12.0 standard drinks    Types: 12 Cans of beer per week    Comment: social use  . Drug use: No    Home Medications Prior to Admission medications   Medication Sig Start Date End Date Taking? Authorizing Provider  acetaminophen (TYLENOL) 500 MG tablet Take 1,000 mg by mouth every 6 (six) hours as needed for mild pain.   Yes [provider]  dextromethorphan (DELSYM) 30 MG/5ML liquid Take 30 mg by mouth at bedtime as needed for cough.   Yes [provider]  fenofibrate 160 MG tablet Take 1 tablet (160 mg total) by mouth daily. 04/17/19  Yes [name], DO  folic acid (FOLVITE) 1 MG tablet Take 1 tablet (1 mg total) by mouth daily. 03/08/20  Yes Patrecia Pour, MD  guaiFENesin  (MUCINEX) 600 MG 12 hr tablet Take 600 mg by mouth daily as needed for cough.    Yes [provider]  Ipratropium-Albuterol (COMBIVENT RESPIMAT) 20-100 MCG/ACT AERS respimat Inhale 1-2 puffs into the lungs every 6 (six) hours as needed for wheezing. 01/22/20  Yes Zigmund Daniel, Cody, DO  LORazepam (ATIVAN) 1 MG tablet TAKE 1 TABLET BY MOUTH THREE TIMES DAILY AS NEEDED FOR ANXIETY Patient taking differently: Take 1 mg by mouth every 8 (eight) hours as needed for anxiety.  01/27/20  Yes [name], DO  losartan (COZAAR) 100 MG tablet Take 1 tablet (100 mg total) by mouth daily. 04/17/19  Yes [name], DO  Multiple Vitamin (MULTIVITAMIN WITH MINERALS) TABS tablet Take 1 tablet by mouth daily. 03/18/20  Yes Mercy Riding,  MD  potassium chloride SA (KLOR-CON) 20 MEQ tablet Take 1 tablet (20 mEq total) by mouth daily. 03/09/20  Yes Curt Bears, MD  traZODone (DESYREL) 50 MG tablet TAKE 1 TO 2 TABLETS BY MOUTH AT BEDTIME AS NEEDED FOR SLEEP Patient taking differently: Take 50 mg by mouth at bedtime.  01/28/20  Yes Matthews, Cody, DO  HYDROcodone-homatropine (HYCODAN) 5-1.5 MG/5ML syrup TAKE 5 MLS BY MOUTH EVERY 6 HOURS AS NEEDED FOR COUGH Patient not taking: Reported on 03/20/2020 03/05/20   Curt Bears, MD  prochlorperazine (COMPAZINE) 10 MG tablet Take 1 tablet (10 mg total) by mouth every 6 (six) hours as needed for nausea or vomiting. Patient not taking: Reported on 03/05/2020 01/30/20   Curt Bears, MD    Allergies    Patient has no known allergies.  Review of Systems   Review of Systems  Constitutional: Positive for appetite change and fatigue. Negative for fever.  Respiratory: Positive for cough and shortness of breath.   Cardiovascular: Negative for chest pain.  Gastrointestinal: Negative for abdominal pain, nausea and vomiting.  Genitourinary: Negative for dysuria and hematuria.  Neurological: Negative for headaches.  All other systems reviewed and are  negative.   Physical Exam Updated Vital Signs BP 136/67   Pulse 92   Temp 98.2 F (36.8 C) (Oral)   Resp (!) 25   Ht 5' 10.5" (1.791 m)   Wt 94.1 kg   SpO2 94%   BMI 29.35 kg/m   Physical Exam Vitals and nursing note reviewed.  Constitutional:      Appearance: Normal appearance. He is well-developed.  HENT:     Head: Normocephalic and atraumatic.  Eyes:     General: Lids are normal.     Conjunctiva/sclera: Conjunctivae normal.     Pupils: Pupils are equal, round, and reactive to light.  Cardiovascular:     Rate and Rhythm: Normal rate and regular rhythm.     Pulses: Normal pulses.     Heart sounds: Normal heart sounds. No murmur heard.  No friction rub. No gallop.   Pulmonary:     Effort: Pulmonary effort is normal.     Comments: Intermittently coughing.  Expiratory wheezing noted.  Rales noted at bases bilaterally. Abdominal:     Palpations: Abdomen is soft. Abdomen is not rigid.     Tenderness: There is no abdominal tenderness. There is no guarding.     Comments: Abdomen is soft, non-distended, non-tender. No rigidity, No guarding. No peritoneal signs.  Musculoskeletal:        General: Normal range of motion.     Cervical back: Full passive range of motion without pain.  Skin:    General: Skin is warm and dry.     Capillary Refill: Capillary refill takes less than 2 seconds.  Neurological:     Mental Status: He is alert and oriented to person, place, and time.  Psychiatric:        Speech: Speech normal.     ED Results / Procedures / Treatments   Labs (all labs ordered are listed, but only abnormal results are displayed) Labs Reviewed  BASIC METABOLIC PANEL - Abnormal; Notable for the following components:      Result Value   Calcium 8.2 (*)    All other components within normal limits  CBC WITH DIFFERENTIAL/PLATELET - Abnormal; Notable for the following components:   RBC 2.41 (*)    Hemoglobin 7.3 (*)    HCT 22.0 (*)    Lymphs Abs 0.6 (*)  All other  components within normal limits  RESP PANEL BY RT-PCR (FLU A&B, COVID) ARPGX2  CULTURE, BLOOD (ROUTINE X 2)  CULTURE, BLOOD (ROUTINE X 2)  URINALYSIS, ROUTINE W REFLEX MICROSCOPIC  LACTIC ACID, PLASMA  LACTIC ACID, PLASMA  TYPE AND SCREEN  PREPARE RBC (CROSSMATCH)  TROPONIN I (HIGH SENSITIVITY)  TROPONIN I (HIGH SENSITIVITY)    EKG EKG Interpretation  Date/Time:  Friday March 20 2020 10:21:31 EST Ventricular Rate:  87 PR Interval:    QRS Duration: 78 QT Interval:  369 QTC Calculation: 444 R Axis:   67 Text Interpretation: Sinus rhythm Atrial premature complexes RSR' in V1 or V2, right VCD or RVH Minimal ST elevation, inferior leads No acute changes No significant change since last tracing Confirmed by Varney Biles [phone]) on 03/20/2020 11:19:00 AM   Radiology DG Chest 2 View  Result Date: 03/20/2020 CLINICAL DATA:  Shortness of breath. Recent sepsis and hospitalization. EXAM: CHEST - 2 VIEW COMPARISON:  03/13/2020 FINDINGS: Extensive airspace disease throughout the right lung, similar to prior study. Small right pleural effusion. Patchy nodular opacities are seen in the left lung as seen on prior CT. Heart is normal size. No acute bony abnormality. IMPRESSION: Extensive airspace disease throughout the right lung concerning for pneumonia. Small right pleural effusion. Findings similar to prior study. Patchy vague ground-glass nodular opacities throughout the left lung as seen on prior CT. No real change since prior study. Electronically Signed   By: Rolm Baptise M.D.   On: 03/20/2020 10:42    Procedures .Critical Care Performed by: Volanda Napoleon, PA-C Authorized by: Volanda Napoleon, PA-C   Critical care provider statement:    Critical care time (minutes):  35   Critical care was necessary to treat or prevent imminent or life-threatening deterioration of the following conditions: anemia.   Critical care was time spent personally by me on the following activities:   Discussions with consultants, evaluation of patient's response to treatment, examination of patient, ordering and performing treatments and interventions, ordering and review of laboratory studies, ordering and review of radiographic studies, pulse oximetry, re-evaluation of patient's condition, obtaining history from patient or surrogate and review of old charts   (including critical care time)  Medications Ordered in ED Medications  azithromycin (ZITHROMAX) 500 mg in sodium chloride 0.9 % 250 mL IVPB (500 mg Intravenous New Bag/Given 03/20/20 1542)  0.9 %  sodium chloride infusion (has no administration in time range)  albuterol (VENTOLIN HFA) 108 (90 Base) MCG/ACT inhaler 2 puff (2 puffs Inhalation Given 03/20/20 1325)  methylPREDNISolone sodium succinate (SOLU-MEDROL) 125 mg/2 mL injection 80 mg (80 mg Intravenous Given 03/20/20 1327)  cefTRIAXone (ROCEPHIN) 1 g in sodium chloride 0.9 % 100 mL IVPB (0 g Intravenous Stopped 03/20/20 1519)    ED Course  I have reviewed the triage vital signs and the nursing notes.  Pertinent labs & imaging results that were available during my care of the patient were reviewed by me and considered in my medical decision making (see chart for details).    MDM Rules/Calculators/A&P                          59 year old male who presents for evaluation of shortness of breath, generalized weakness.  History of lung cancer and was recently admitted on 11/19-11/23 for sepsis secondary to pneumonia.  Was discharged and states that since discharge, he has felt weak, tired.  He states that is gotten worse over the last  day or so.  He felt like anytime he tries to get up and walk around, he just feels very tired and has generalized weakness.  He states he has not noted any fevers.  Denies any chest pain.  He was told that he had anemia during his admission and was told that he may need a transfusion but he declined at that time.  Patient states he has not noted any  bleeding from rectum.  On initially arrival, he is afebrile, appears uncomfortable but no acute distress.  On exam, he has diffuse wheezing noted and is intermittently coughing.  He states that he was not discharged home on any antibiotics.  We will plan to check chest x-ray, labs.  Review of his visit here during admission for sepsis from pneumonia.  He had azithromycin, Vanco from 11/19-22.  IV cefepime was added after initial dose of azithromycin.  He was not discharged home on any antibiotics.  He did have evidence of anemia.  His baseline hemoglobin is 9-11.  On admission, was 8.9 and down trended to 7.4.  There was thought to be some chronic anemia that was at play as well as dilution from IV fluid.  He had not had any GI bleed symptoms.  He had an anemia panel done on 11/11 which was consistent with anemia of chronic disease.  He declined blood transfusion at that time.  BMP shows normal BUN/Cr. CBC shows hgb of 7.3.   CXR shows extensive airway disease throughout the right lung concerning for pneumonia. Small right pleural effusion.   At this time, I feel the patient is symptomatic. He has a drop in his hemoglobin and is very weak. Additionally, his chest x-ray does not improve. He may benefit from continued antibiotics as well as transfusion.  Discussed patient with Dr. Neysa Bonito (hospitalist) who is agreeable for admission. Will start transfusion.   Portions of this note were generated with [employer]. Dictation errors may occur despite best attempts at proofreading.    Final Clinical Impression(s) / ED Diagnoses Final diagnoses:  Shortness of breath  Symptomatic anemia  Community acquired pneumonia, unspecified laterality    Rx / DC Orders ED Discharge Orders    None       Desma Mcgregor 03/20/20 [place], [place], MD 03/21/20 [phone]

## 2020-03-20 NOTE — ED Notes (Signed)
Report to floor

## 2020-03-20 NOTE — Telephone Encounter (Signed)
Jaquin states he is still running a fever that comes and goes, continued difficulty breathing. Instructed to go to the ED.

## 2020-03-20 NOTE — Progress Notes (Signed)
Report received from ED 

## 2020-03-21 ENCOUNTER — Observation Stay (HOSPITAL_COMMUNITY): Payer: BC Managed Care – PPO

## 2020-03-21 DIAGNOSIS — R0602 Shortness of breath: Secondary | ICD-10-CM | POA: Diagnosis not present

## 2020-03-21 DIAGNOSIS — D649 Anemia, unspecified: Secondary | ICD-10-CM | POA: Diagnosis not present

## 2020-03-21 LAB — TYPE AND SCREEN
ABO/RH(D): O POS
Antibody Screen: NEGATIVE
Unit division: 0

## 2020-03-21 LAB — CBC
HCT: 25.9 % — ABNORMAL LOW (ref 39.0–52.0)
Hemoglobin: 8.7 g/dL — ABNORMAL LOW (ref 13.0–17.0)
MCH: 30.2 pg (ref 26.0–34.0)
MCHC: 33.6 g/dL (ref 30.0–36.0)
MCV: 89.9 fL (ref 80.0–100.0)
Platelets: 314 10*3/uL (ref 150–400)
RBC: 2.88 MIL/uL — ABNORMAL LOW (ref 4.22–5.81)
RDW: 13.2 % (ref 11.5–15.5)
WBC: 5.9 10*3/uL (ref 4.0–10.5)
nRBC: 0 % (ref 0.0–0.2)

## 2020-03-21 LAB — ECHOCARDIOGRAM COMPLETE
Area-P 1/2: 5.13 cm2
Calc EF: 65.5 %
Height: 70.5 in
S' Lateral: 3.9 cm
Single Plane A2C EF: 65.2 %
Single Plane A4C EF: 65.2 %
Weight: 3054.69 oz

## 2020-03-21 LAB — BASIC METABOLIC PANEL
Anion gap: 9 (ref 5–15)
BUN: 11 mg/dL (ref 6–20)
CO2: 27 mmol/L (ref 22–32)
Calcium: 8.2 mg/dL — ABNORMAL LOW (ref 8.9–10.3)
Chloride: 100 mmol/L (ref 98–111)
Creatinine, Ser: 0.5 mg/dL — ABNORMAL LOW (ref 0.61–1.24)
GFR, Estimated: >60 mL/min (ref >60)
Glucose, Bld: 133 mg/dL — ABNORMAL HIGH (ref 70–99)
Potassium: 3.9 mmol/L (ref 3.5–5.1)
Sodium: 136 mmol/L (ref 135–145)

## 2020-03-21 LAB — BPAM RBC
Blood Product Expiration Date: 202112282359
ISSUE DATE / TIME: 202111261949
Unit Type and Rh: 5100

## 2020-03-21 MED ORDER — PERFLUTREN LIPID MICROSPHERE
1.0000 mL | INTRAVENOUS | Status: AC | PRN
  Administered 2020-03-21: 2 mL via INTRAVENOUS
  Filled 2020-03-21: qty 10

## 2020-03-21 MED ORDER — DM-GUAIFENESIN ER 30-600 MG PO TB12
1.0000 | ORAL_TABLET | Freq: Two times a day (BID) | ORAL | Status: DC
  Administered 2020-03-21 – 2020-03-24 (×7): 1 via ORAL
  Filled 2020-03-21 (×7): qty 1

## 2020-03-21 NOTE — Progress Notes (Signed)
  Echocardiogram 2D Echocardiogram has been performed.  Michiel Cowboy 03/21/2020, 8:56 AM

## 2020-03-21 NOTE — Progress Notes (Signed)
PROGRESS NOTE    Richard Li  JSH:[id number] DOB: 05-Apr-1961 DOA: 03/20/2020 PCP: [name], DO   Brief Narrative:  This is a 59 year old male with past medical history of NSCLC, asthma, hypertension, recent admission for septic shock secondary to multifocal pneumonia from 11/19-11/23 who presented to the ED with persistent shortness of breath, cough and generalized weakness.  At his recent hospitalization he completed 5 days of antibiotic therapy with vancomycin and cefepime and was discharged without any antibiotics.  States that since he has been home he has not felt any better and has had fevers intermittently up to 102 F.  Also has felt short of breath on exertion and weak.  He was offered a blood transfusion at his last hospitalization but declined due to concerns of any adverse reactions and was started on folic acid due to deficiency on his last visit.  Has been taking his folic acid.  Also admits to diffuse swelling since his hospitalization and weight gain but denies any known history of heart failure   Assessment & Plan:   Principal Problem:   Symptomatic anemia Active Problems:   Essential hypertension   CAP (community acquired pneumonia)   Volume overload   Acute symptomatic anemia, rule out acute blood loss vs worsening chronic iron deficiency anemia/anemia of chronic disease Hb slowly trending downward without bleeding source, currently 7.3 Recent iron studies showing elevated ferritin likely acute phase reactant and with folic acid deficiency Continue folic acid Transfuse 1 unit PRBCs, risks/benefits were discussed with the patient and agreed to transfusion Lasix with transfusion  Community-acquired pneumonia, POA Rule out aspiration pneumonia or pneumonitis Continue ceftriaxone and azithromycin Speech evaluation pending, patient and wife indicate occasional choking with fluids which may explain his acute recurrent worsening after completion of  antibiotics Patient does not meet sepsis criteria  Volume overload Lasix 40 mg IV x 1 Echo Daily weights and intake/output  Hypertension Continue home meds  DVT prophylaxis: SCDs Code Status: Full code Family Communication: At bedside  Status is: Inpatient  Dispo: The patient is from: Home              Anticipated d/c is to: Home              Anticipated d/c date is: 48 to 72 hours              Patient currently not medically stable for discharge given ongoing cough, shortness of breath sputum production concerning for pneumonia with failure of previous antibiotic regimen, currently requires IV antibiotics  Consultants:   None  Procedures:   None planned  Antimicrobials:  Azithromycin, ceftriaxone  Subjective: No acute issues or events overnight, ongoing dyspnea with exertion cough sputum production, denies overt fevers but does remark on multiple episodes of chills over the past 24 hours.  Otherwise denies chest pain, nausea, vomiting, diarrhea, constipation.  Objective: Vitals:   03/20/20 2242 03/20/20 2344 03/21/20 0059 03/21/20 0507  BP: 129/66  138/65 113/72  Pulse: 78 68 67 89  Resp: 16  14 18   Temp: 98.3 F (36.8 C)  98 F (36.7 C) 97.6 F (36.4 C)  TempSrc: Oral  Oral Oral  SpO2: (!) 89% 93% 93% 92%  Weight:    86.6 kg  Height:        Intake/Output Summary (Last 24 hours) at 03/21/2020 0820 Last data filed at 03/20/2020 2230 Gross per 24 hour  Intake 681.5 ml  Output 900 ml  Net -218.5 ml   [employer]  03/20/20 1013 03/21/20 0507  Weight: 94.1 kg 86.6 kg    Examination:  General exam: Appears calm and comfortable  Respiratory system: Coarse breath sounds right lung fields diffusely otherwise without wheeze or rales Cardiovascular system: S1 & S2 heard, RRR. No JVD, murmurs, rubs, gallops or clicks. No pedal edema. Gastrointestinal system: Abdomen is nondistended, soft and nontender. No organomegaly or masses felt. Normal bowel sounds  heard. Central nervous system: Alert and oriented. No focal neurological deficits. Extremities: Symmetric 5 x 5 power. Skin: No rashes, lesions or ulcers Psychiatry: Judgement and insight appear normal. Mood & affect appropriate.     Data Reviewed: I have personally reviewed following labs and imaging studies  CBC: Recent Labs  Lab 03/14/20 0846 03/14/20 0846 03/15/20 0529 03/16/20 0533 03/17/20 0526 03/20/20 1050 03/21/20 0626  WBC 4.6  --  6.5 5.2  --  6.5 5.9  NEUTROABS  --   --   --   --   --  5.1  --   HGB 8.7*   < > 8.0* 7.7* 7.4* 7.3* 8.7*  HCT 25.5*   < > 23.1* 22.9* 22.0* 22.0* 25.9*  MCV 88.2  --  87.8 89.5  --  91.3 89.9  PLT 196  --  219 239  --  264 314   < > = values in this interval not displayed.   Basic Metabolic Panel: Recent Labs  Lab 03/15/20 0529 03/16/20 0533 03/17/20 0526 03/20/20 1050 03/21/20 0626  NA 137 137 137 135 136  K 3.1* 3.2* 3.2* 3.7 3.9  CL 103 103 103 100 100  CO2 25 25 27 26 27   GLUCOSE 132* 96 92 97 133*  BUN 12 10 10 8 11   CREATININE 0.57* 0.61 0.66 0.70 0.50*  CALCIUM 7.4* 7.9* 7.9* 8.2* 8.2*  MG 1.6* 2.0 1.8  --   --   PHOS  --  1.6* 1.9*  --   --    GFR: Estimated Creatinine Clearance: 104.3 mL/min (A) (by C-G formula based on SCr of 0.5 mg/dL (L)). Liver Function Tests: Recent Labs  Lab 03/14/20 0846 03/15/20 0529 03/16/20 0533 03/17/20 0526  AST 26 41  --   --   ALT 19 24  --   --   ALKPHOS 69 62  --   --   BILITOT 1.0 0.5  --   --   PROT 5.0* 4.6*  --   --   ALBUMIN 2.0* 1.8* 2.0* 2.0*   No results for input(s): LIPASE, AMYLASE in the last 168 hours. No results for input(s): AMMONIA in the last 168 hours. Coagulation Profile: No results for input(s): INR, PROTIME in the last 168 hours. Cardiac Enzymes: No results for input(s): CKTOTAL, CKMB, CKMBINDEX, TROPONINI in the last 168 hours. BNP (last 3 results) No results for input(s): PROBNP in the last 8760 hours. HbA1C: No results for input(s): HGBA1C in  the last 72 hours. CBG: No results for input(s): GLUCAP in the last 168 hours. Lipid Profile: No results for input(s): CHOL, HDL, LDLCALC, TRIG, CHOLHDL, LDLDIRECT in the last 72 hours. Thyroid Function Tests: No results for input(s): TSH, T4TOTAL, FREET4, T3FREE, THYROIDAB in the last 72 hours. Anemia Panel: No results for input(s): VITAMINB12, FOLATE, FERRITIN, TIBC, IRON, RETICCTPCT in the last 72 hours. Sepsis Labs: Recent Labs  Lab 03/20/20 1445 03/20/20 1720  LATICACIDVEN 0.9 0.8    Recent Results (from the past 240 hour(s))  Blood Culture (routine x 2)     Status: None   Collection  Time: 03/13/20 10:43 AM   Specimen: BLOOD  Result Value Ref Range Status   Specimen Description   Final    BLOOD LEFT ARM Performed at [facility] [address], [place], [place] [address]    Special Requests   Final    BOTTLES DRAWN AEROBIC AND ANAEROBIC Blood Culture results may not be optimal due to an inadequate volume of blood received in culture bottles Performed at [facility] [address], [place], [place] [address]    Culture   Final    NO GROWTH 5 DAYS Performed at [facility], [place] [address], [place], [place] [address]    Report Status 03/18/2020 FINAL  Final  Blood Culture (routine x 2)     Status: None   Collection Time: 03/13/20 10:48 AM   Specimen: BLOOD  Result Value Ref Range Status   Specimen Description   Final    BLOOD LEFT ARM Performed at [facility] [address] [place], [place] [address]    Special Requests   Final    BOTTLES DRAWN AEROBIC AND ANAEROBIC Blood Culture adequate volume Performed at [facility] [address] [place], [place] [address]    Culture   Final    NO GROWTH 5 DAYS Performed at [facility], [place] [address] [place] [address] [place], [place] [address]    Report Status 03/18/2020 FINAL  Final  Resp Panel by RT-PCR (Flu A&B, Covid) Nasopharyngeal Swab      Status: None   Collection Time: 03/13/20 11:08 AM   Specimen: Nasopharyngeal Swab; Nasopharyngeal(NP) swabs in vial transport medium  Result Value Ref Range Status   SARS Coronavirus 2 by RT PCR NEGATIVE NEGATIVE Final    Comment: (NOTE) SARS-CoV-2 target nucleic acids are NOT DETECTED.  The SARS-CoV-2 RNA is generally detectable in upper respiratory specimens during the acute phase of infection. The lowest concentration of SARS-CoV-2 viral copies this assay can detect is 138 copies/mL. A negative result does not preclude SARS-Cov-2 infection and should not be used as the sole basis for treatment or other patient management decisions. A negative result may occur with  improper specimen collection/handling, submission of specimen other than nasopharyngeal swab, presence of viral mutation(s) within the areas targeted by this assay, and inadequate number of viral copies(<138 copies/mL). A negative result must be combined with clinical observations, patient history, and epidemiological information. The expected result is Negative.  Fact Sheet for Patients:  EntrepreneurPulse.com.au  Fact Sheet for Healthcare Providers:  IncredibleEmployment.be  This test is no t yet approved or cleared by the [place] FDA and  has been authorized for detection and/or diagnosis of SARS-CoV-2 by FDA under an Emergency Use Authorization (EUA). This EUA will remain  in effect (meaning this test can be used) for the duration of the COVID-19 declaration under Section 564(b)(1) of the Act, 21 U.S.C.section 360bbb-3(b)(1), unless the authorization is terminated  or revoked sooner.       Influenza A by PCR NEGATIVE NEGATIVE Final   Influenza B by PCR NEGATIVE NEGATIVE Final    Comment: (NOTE) The Xpert Xpress SARS-CoV-2/FLU/RSV plus assay is intended as an aid in the diagnosis of influenza from Nasopharyngeal swab specimens and should not be used as a sole basis  for treatment. Nasal washings and aspirates are unacceptable for Xpert Xpress SARS-CoV-2/FLU/RSV testing.  Fact Sheet for Patients: EntrepreneurPulse.com.au  Fact Sheet for Healthcare Providers: IncredibleEmployment.be  This test is not yet approved or cleared by  the [employer] and has been authorized for detection and/or diagnosis of SARS-CoV-2 by FDA under an Emergency Use Authorization (EUA). This EUA will remain in effect (meaning this test can be used) for the duration of the COVID-19 declaration under Section 564(b)(1) of the Act, 21 U.S.C. section 360bbb-3(b)(1), unless the authorization is terminated or revoked.  Performed at [facility], [place] [address] [place], [place] [address]   Urine culture     Status: None   Collection Time: 03/13/20  2:00 PM   Specimen: In/Out Cath Urine  Result Value Ref Range Status   Specimen Description   Final    IN/OUT CATH URINE Performed at [facility] [address] [place], [place] [address]    Special Requests   Final    NONE Performed at [facility], [place] [address] [place], [place] [address]    Culture   Final    NO GROWTH Performed at [facility], [name] [address], [place], [place] [address]    Report Status 03/15/2020 FINAL  Final  MRSA PCR Screening     Status: None   Collection Time: 03/15/20  9:59 AM   Specimen: Nasopharyngeal  Result Value Ref Range Status   MRSA by PCR NEGATIVE NEGATIVE Final    Comment:        The GeneXpert MRSA Assay (FDA approved for NASAL specimens only), is one component of a comprehensive MRSA colonization surveillance program. It is not intended to diagnose MRSA infection nor to guide or monitor treatment for MRSA infections. Performed at [facility], [place] [address] [place], [place] [address]   Blood culture (routine x 2)     Status: None (Preliminary result)    Collection Time: 03/20/20  2:30 PM   Specimen: BLOOD  Result Value Ref Range Status   Specimen Description   Final    BLOOD RIGHT ANTECUBITAL Performed at [facility] [address], [place], [place] [address]    Special Requests   Final    BOTTLES DRAWN AEROBIC AND ANAEROBIC Blood Culture adequate volume Performed at [facility] [address], [place], [place] [address]    Culture   Final    NO GROWTH < 12 HOURS Performed at [facility] [address], [place], [place] [address]    Report Status PENDING  Incomplete  Resp Panel by RT-PCR (Flu A&B, Covid) Urine, Clean Catch     Status: None   Collection Time: 03/20/20  2:30 PM   Specimen: Urine, Clean Catch; Nasopharyngeal(NP) swabs in vial transport medium  Result Value Ref Range Status   SARS Coronavirus 2 by RT PCR NEGATIVE NEGATIVE Final    Comment: (NOTE) SARS-CoV-2 target nucleic acids are NOT DETECTED.  The SARS-CoV-2 RNA is generally detectable in upper respiratory specimens during the acute phase of infection. The lowest concentration of SARS-CoV-2 viral copies this assay can detect is 138 copies/mL. A negative result does not preclude SARS-Cov-2 infection and should not be used as the sole basis for treatment or other patient management decisions. A negative result may occur with  improper specimen collection/handling, submission of specimen other than nasopharyngeal swab, presence of viral mutation(s) within the areas targeted by this assay, and inadequate number of viral copies(<138 copies/mL). A negative result must be combined with clinical observations, patient history, and epidemiological information. The expected result is Negative.  Fact Sheet for Patients:  EntrepreneurPulse.com.au  Fact Sheet for Healthcare  Providers:  IncredibleEmployment.be  This test is no t yet approved or cleared by the [place] and    has been authorized for detection and/or diagnosis of SARS-CoV-2 by FDA under an Emergency Use Authorization (EUA). This EUA will remain  in effect (meaning this test can be used) for the duration of the COVID-19 declaration under Section 564(b)(1) of the Act, 21 U.S.C.section 360bbb-3(b)(1), unless the authorization is terminated  or revoked sooner.       Influenza A by PCR NEGATIVE NEGATIVE Final   Influenza B by PCR NEGATIVE NEGATIVE Final    Comment: (NOTE) The Xpert Xpress SARS-CoV-2/FLU/RSV plus assay is intended as an aid in the diagnosis of influenza from Nasopharyngeal swab specimens and should not be used as a sole basis for treatment. Nasal washings and aspirates are unacceptable for Xpert Xpress SARS-CoV-2/FLU/RSV testing.  Fact Sheet for Patients: EntrepreneurPulse.com.au  Fact Sheet for Healthcare Providers: IncredibleEmployment.be  This test is not yet approved or cleared by the [place] FDA and has been authorized for detection and/or diagnosis of SARS-CoV-2 by FDA under an Emergency Use Authorization (EUA). This EUA will remain in effect (meaning this test can be used) for the duration of the COVID-19 declaration under Section 564(b)(1) of the Act, 21 U.S.C. section 360bbb-3(b)(1), unless the authorization is terminated or revoked.  Performed at [facility], [place] [address], [place], [place] [address]   Blood culture (routine x 2)     Status: None (Preliminary result)   Collection Time: 03/20/20  2:44 PM   Specimen: BLOOD  Result Value Ref Range Status   Specimen Description   Final    BLOOD LEFT ANTECUBITAL Performed at [facility] [address] [place], [place] [address]    Special Requests   Final    BOTTLES DRAWN AEROBIC AND ANAEROBIC Blood Culture adequate volume Performed at [facility] [address], [place], [place] [address]    Culture    Final    NO GROWTH < 12 HOURS Performed at [facility] [address] [place], [place] [address]    Report Status PENDING  Incomplete         Radiology Studies: DG Chest 2 View  Result Date: 03/20/2020 CLINICAL DATA:  Shortness of breath. Recent sepsis and hospitalization. EXAM: CHEST - 2 VIEW COMPARISON:  03/13/2020 FINDINGS: Extensive airspace disease throughout the right lung, similar to prior study. Small right pleural effusion. Patchy nodular opacities are seen in the left lung as seen on prior CT. Heart is normal size. No acute bony abnormality. IMPRESSION: Extensive airspace disease throughout the right lung concerning for pneumonia. Small right pleural effusion. Findings similar to prior study. Patchy vague ground-glass nodular opacities throughout the left lung as seen on prior CT. No real change since prior study. Electronically Signed   By: Rolm Baptise M.D.   On: 03/20/2020 10:42        Scheduled Meds: . fenofibrate  160 mg Oral Daily  . folic acid  1 mg Oral Daily  . losartan  100 mg Oral Daily  . potassium chloride SA  20 mEq Oral Daily  . sodium chloride flush  3 mL Intravenous Q12H  . traZODone  50 mg Oral QHS   Continuous Infusions: . azithromycin    . cefTRIAXone (ROCEPHIN)  IV       LOS: 0 days   Time spent: 97min  Stephaney Steven C Marciana Uplinger, DO [facility]  If 7PM-7AM, please contact night-coverage  [email or web address]  03/21/2020, 8:20 AM

## 2020-03-22 DIAGNOSIS — D649 Anemia, unspecified: Secondary | ICD-10-CM | POA: Diagnosis not present

## 2020-03-22 LAB — CBC
HCT: 23.3 % — ABNORMAL LOW (ref 39.0–52.0)
Hemoglobin: 7.8 g/dL — ABNORMAL LOW (ref 13.0–17.0)
MCH: 30 pg (ref 26.0–34.0)
MCHC: 33.5 g/dL (ref 30.0–36.0)
MCV: 89.6 fL (ref 80.0–100.0)
Platelets: 298 10*3/uL (ref 150–400)
RBC: 2.6 MIL/uL — ABNORMAL LOW (ref 4.22–5.81)
RDW: 13.6 % (ref 11.5–15.5)
WBC: 5.4 10*3/uL (ref 4.0–10.5)
nRBC: 0 % (ref 0.0–0.2)

## 2020-03-22 LAB — BASIC METABOLIC PANEL
Anion gap: 8 (ref 5–15)
BUN: 13 mg/dL (ref 6–20)
CO2: 29 mmol/L (ref 22–32)
Calcium: 8.3 mg/dL — ABNORMAL LOW (ref 8.9–10.3)
Chloride: 102 mmol/L (ref 98–111)
Creatinine, Ser: 0.78 mg/dL (ref 0.61–1.24)
GFR, Estimated: >60 mL/min (ref >60)
Glucose, Bld: 93 mg/dL (ref 70–99)
Potassium: 4.3 mmol/L (ref 3.5–5.1)
Sodium: 139 mmol/L (ref 135–145)

## 2020-03-22 LAB — HEMOGLOBIN AND HEMATOCRIT, BLOOD
HCT: 27.5 % — ABNORMAL LOW (ref 39.0–52.0)
Hemoglobin: 8.9 g/dL — ABNORMAL LOW (ref 13.0–17.0)

## 2020-03-22 LAB — OCCULT BLOOD X 1 CARD TO LAB, STOOL: Fecal Occult Bld: NEGATIVE

## 2020-03-22 NOTE — Progress Notes (Signed)
When adminstering medication IV, patient began to c/o of burning around IV site. Patient's IV infiltrated. Placed an order for IV to assess and restart an IV site due to difficult IV stick. Placed a warm compress on area to relieve pain/symptoms.

## 2020-03-22 NOTE — Evaluation (Signed)
Clinical/Bedside Swallow Evaluation Patient Details  Name: Richard Li MRN: [id number] Date of Birth: [birth year]  Today's Date: 03/22/2020 Time: SLP Start Time (ACUTE ONLY): 1645 SLP Stop Time (ACUTE ONLY): 1705 SLP Time Calculation (min) (ACUTE ONLY): 20 min  Past Medical History:  Past Medical History:  Diagnosis Date  . Allergic rhinitis, cause unspecified   . Anxiety state, unspecified   . Asthma    as a child  . Chronic airway obstruction, not elsewhere classified   . Esophageal reflux   . History of kidney stones   . Hypertension   . Lumbago   . Other and unspecified hyperlipidemia   . Other chest pain    Past Surgical History:  Past Surgical History:  Procedure Laterality Date  . APPENDECTOMY    . BRONCHIAL BIOPSY  01/07/2020   Procedure: BRONCHIAL BIOPSIES;  Surgeon: Collene Gobble, MD;  Location[facility] END[facility]COPY;  Service: Pulmonary;;  . BRONCHIAL BRUSHINGS  01/07/2020   Procedure: BRONCHIAL BRUSHINGS;  Surgeon: Collene Gobble, MD;  Location: [facility] ENDOSCOPY;  Service: Pulmonary;;  . BRONCHIAL NEEDLE ASPIRATION BIOPSY  01/07/2020   Procedure: BRONCHIAL NEEDLE ASPIRATION BIOPSIES;  Surgeon: Collene Gobble, MD;  Location: [facility] ENDOSCOPY;  Service: Pulmonary;;  . BRONCHIAL WASHINGS  01/07/2020   Procedure: BRONCHIAL WASHINGS;  Surgeon: Collene Gobble, MD;  Location: [facility] ENDOSCOPY;  Service: Pulmonary;;  . COLONOSCOPY  15 years ago  . HEMOSTASIS CONTROL  01/07/2020   Procedure: HEMOSTASIS CONTROL;  Surgeon: Collene Gobble, MD;  Location: [facility] ENDOSCOPY;  Service: Pulmonary;;  cold saline  . NASAL TURBINATE REDUCTION  2002   Dr.Crossley  . VASECTOMY    . VIDEO BRONCHOSCOPY WITH ENDOBRONCHIAL NAVIGATION N/A 01/07/2020   Procedure: VIDEO BRONCHOSCOPY WITH ENDOBRONCHIAL NAVIGATION;  Surgeon: Collene Gobble, MD;  Location: [facility] ENDOSCOPY;  Service: Pulmonary;  Laterality: N/A;  . VIDEO BRONCHOSCOPY WITH ENDOBRONCHIAL ULTRASOUND N/A 01/07/2020   Procedure: VIDEO BRONCHOSCOPY WITH  ENDOBRONCHIAL ULTRASOUND;  Surgeon: Collene Gobble, MD;  Location: [facility] ENDOSCOPY;  Service: Pulmonary;  Laterality: N/A;   HPI:  This is a 59 year old male with past medical history of NSCLC, asthma, hypertension, recent admission for septic shock secondary to multifocal pneumonia from 11/19-11/23 who presented to the ED with persistent shortness of breath, cough and generalized weakness. CXR 03/20/20: Extensive airspace disease throughout the right lung concerning for pneumonia versus changes from radiation treatment. Small right pleural effusion. Findings similar to prior study.   Assessment / Plan / Recommendation Clinical Impression  Richard Li endorses coughing with liquid intake. He has history of prolonged PNA, previously treated with IV antibiotics without resolution, so he is back in the hospital. He has been receiving XRT to lung for NSCLC with last treatment of last week. He has two more treatments scheduled for next week (may be held) prior to scans. He reports the coughing with liquids began about 3 weeks ago (during XRT). He now has poor appetite, poor intake, and very sore throat from coughing up mucus. Cup at bedside full of thick light colored mucus. Oral mech exam was WNL with the exception of low vocal intensity (pt appears weak/deconditioned). He had audible swallows noted across all trials with multiple swallows 5-10 per bolus of thin liquid. He also had immediate cough response with all liquid intake. He had multiple swallows with puree, but no cough. With graham cracker, pt was noted to take VERY small bites, which he reported was r/t having a sore throat versus globus or impaired mastication.  Given recurring/unresolving PNA and cough with liquid intake, recommend MBSS to be completed next date to assess swallowing physiology and create individualized treatment plan. Pt may continue with thin liquids in small sips and solids as tolerated (regular--to order foods that won't hurt his  throat) pending results of study.    SLP Visit Diagnosis: Dysphagia, unspecified (R13.10)    Aspiration Risk  Moderate aspiration risk;Risk for inadequate nutrition/hydration    Diet Recommendation Regular;Thin liquid   Liquid Administration via: Cup Medication Administration: Whole meds with liquid Supervision: Patient able to self feed    Other  Recommendations Oral Care Recommendations: Oral care BID;Patient independent with oral care   Follow up Recommendations        Frequency and Duration min 1 x/week  1 week       Prognosis   Pending results of MBSS     Swallow Study   General Date of Onset: 03/13/20 HPI: This is a 59 year old male with past medical history of NSCLC, asthma, hypertension, recent admission for septic shock secondary to multifocal pneumonia from 11/19-11/23 who presented to the ED with persistent shortness of breath, cough and generalized weakness. CXR 03/20/20: Extensive airspace disease throughout the right lung concerning for pneumonia versus changes from radiation treatment. Small right pleural effusion. Findings similar to prior study. Type of Study: Bedside Swallow Evaluation Previous Swallow Assessment: n/a Diet Prior to this Study: Regular;Thin liquids Temperature Spikes Noted: Yes Respiratory Status: Room air History of Recent Intubation: No Behavior/Cognition: Alert;Cooperative Oral Cavity Assessment: Within Functional Limits Oral Care Completed by SLP: No Oral Cavity - Dentition: Adequate natural dentition Vision: Functional for self-feeding Self-Feeding Abilities: Able to feed self Patient Positioning: Other (comment) (edge of bed) Baseline Vocal Quality: Low vocal intensity Volitional Cough: Strong;Congested Volitional Swallow: Able to elicit    Oral/Motor/Sensory Function Overall Oral Motor/Sensory Function: Within functional limits   Ice Chips Ice chips: Not tested   Thin Liquid Thin Liquid: Impaired Presentation:  Straw;Cup Pharyngeal  Phase Impairments: Multiple swallows;Wet Vocal Quality;Cough - Immediate;Throat Clearing - Immediate Other Comments: audible swallows    Nectar Thick Nectar Thick Liquid: Not tested   Honey Thick Honey Thick Liquid: Not tested   Puree Puree: Within functional limits Presentation: Self Fed   Solid     Solid: Within functional limits Presentation: [place]. Damonie Ellenwood, M.S., CCC-SLP Speech-Language Pathologist Acute Rehabilitation Services Pager: [place] 03/22/2020,5:08 PM

## 2020-03-22 NOTE — Progress Notes (Addendum)
PROGRESS NOTE    Richard Li  IZT:[id number] DOB: [birth year] DOA: 03/20/2020 PCP: [name], DO   Brief Narrative:  This is a 59 year old male with past medical history of NSCLC, asthma, hypertension, recent admission for septic shock secondary to multifocal pneumonia from 11/19-11/23 who presented to the ED with persistent shortness of breath, cough and generalized weakness.  At his recent hospitalization he completed 5 days of antibiotic therapy with vancomycin and cefepime and was discharged without any antibiotics.  States that since he has been home he has not felt any better and has had fevers intermittently up to 102 F.  Also has felt short of breath on exertion and weak.  He was offered a blood transfusion at his last hospitalization but declined due to concerns of any adverse reactions and was started on folic acid due to deficiency on his last visit.  Has been taking his folic acid.  Also admits to diffuse swelling since his hospitalization and weight gain but denies any known history of heart failure   Assessment & Plan:   Principal Problem:   Symptomatic anemia Active Problems:   Essential hypertension   CAP (community acquired pneumonia)   Volume overload   Acute symptomatic anemia, rule out acute blood loss vs worsening chronic iron deficiency anemia/anemia of chronic disease Hb slowly trending downward without bleeding source, currently 8.9 after recheck Follow FOBT to rule out occult bleeding Recent iron studies showing elevated ferritin likely acute phase reactant and with folic acid deficiency Continue folic acid Transfused 1 unit PRBC at admission, tolerated well  Community-acquired pneumonia, POA Rule out aspiration pneumonia or pneumonitis Likely immunocompromised given ongoing radiation Continue ceftriaxone and azithromycin Speech evaluation pending, patient and wife indicate occasional choking with fluids which may explain his acute recurrent  worsening after completion of antibiotics Patient does not meet sepsis criteria  Volume overload Lasix 40 mg IV x 1 Echo - grade 1 diastolic dysfunction; small pericardial effusion without tamponade - fairly unremarkable otherwise  Daily weights and intake/output  Hypertension Continue home meds  DVT prophylaxis: SCDs Code Status: Full code Family Communication: At bedside  Status is: Inpatient  Dispo: The patient is from: Home              Anticipated d/c is to: Home              Anticipated d/c date is: 48 to 72 hours              Patient currently not medically stable for discharge given ongoing cough, shortness of breath sputum production concerning for pneumonia with failure of previous antibiotic regimen, currently requires IV antibiotics  Consultants:   None  Procedures:   None planned  Antimicrobials:  Azithromycin, ceftriaxone  Subjective: No acute issues or events overnight, patient has ongoing dyspnea with exertion, improving at rest, cough sputum production also improving but not yet resolved.  Patient continues to feel generally fatigued and weak.  Denies stool color changes, somewhat hesitant to be evaluated with physical therapy given his concern for being weak.  Objective: Vitals:   03/21/20 0507 03/21/20 1503 03/21/20 2031 03/22/20 0618  BP: 113/72 111/61 (!) 102/51 (!) 121/59  Pulse: 89 90 97 82  Resp: 18 17 20 20   Temp: 97.6 F (36.4 C) 98.1 F (36.7 C) 98.3 F (36.8 C) 98.8 F (37.1 C)  TempSrc: Oral Oral Oral Oral  SpO2: 92% 96% 94% 94%  Weight: 86.6 kg     Height:  Intake/Output Summary (Last 24 hours) at 03/22/2020 0809 Last data filed at 03/21/2020 2130 Gross per 24 hour  Intake 810.42 ml  Output 1250 ml  Net -439.58 ml   Filed Weights   03/20/20 1013 03/21/20 0507  Weight: 94.1 kg 86.6 kg    Examination:  General exam: Appears calm and comfortable  Respiratory system: Coarse breath sounds right lung fields diffusely  otherwise without wheeze or rales Cardiovascular system: S1 & S2 heard, RRR. No JVD, murmurs, rubs, gallops or clicks. No pedal edema. Gastrointestinal system: Abdomen is nondistended, soft and nontender. No organomegaly or masses felt. Normal bowel sounds heard. Central nervous system: Alert and oriented. No focal neurological deficits. Extremities: Symmetric 5 x 5 power. Skin: No rashes, lesions or ulcers Psychiatry: Judgement and insight appear normal. Mood & affect appropriate.     Data Reviewed: I have personally reviewed following labs and imaging studies  CBC: Recent Labs  Lab 03/16/20 0533 03/17/20 0526 03/20/20 1050 03/21/20 0626 03/22/20 0726  WBC 5.2  --  6.5 5.9 5.4  NEUTROABS  --   --  5.1  --   --   HGB 7.7* 7.4* 7.3* 8.7* 7.8*  HCT 22.9* 22.0* 22.0* 25.9* 23.3*  MCV 89.5  --  91.3 89.9 89.6  PLT 239  --  264 314 588   Basic Metabolic Panel: Recent Labs  Lab 03/16/20 0533 03/17/20 0526 03/20/20 1050 03/21/20 0626 03/22/20 0726  NA 137 137 135 136 139  K 3.2* 3.2* 3.7 3.9 4.3  CL 103 103 100 100 102  CO2 25 27 26 27 29   GLUCOSE 96 92 97 133* 93  BUN 10 10 8 11 13   CREATININE 0.61 0.66 0.70 0.50* 0.78  CALCIUM 7.9* 7.9* 8.2* 8.2* 8.3*  MG 2.0 1.8  --   --   --   PHOS 1.6* 1.9*  --   --   --    GFR: Estimated Creatinine Clearance: 104.3 mL/min (by C-G formula based on SCr of 0.78 mg/dL). Liver Function Tests: Recent Labs  Lab 03/16/20 0533 03/17/20 0526  ALBUMIN 2.0* 2.0*   No results for input(s): LIPASE, AMYLASE in the last 168 hours. No results for input(s): AMMONIA in the last 168 hours. Coagulation Profile: No results for input(s): INR, PROTIME in the last 168 hours. Cardiac Enzymes: No results for input(s): CKTOTAL, CKMB, CKMBINDEX, TROPONINI in the last 168 hours. BNP (last 3 results) No results for input(s): PROBNP in the last 8760 hours. HbA1C: No results for input(s): HGBA1C in the last 72 hours. CBG: No results for input(s):  GLUCAP in the last 168 hours. Lipid Profile: No results for input(s): CHOL, HDL, LDLCALC, TRIG, CHOLHDL, LDLDIRECT in the last 72 hours. Thyroid Function Tests: No results for input(s): TSH, T4TOTAL, FREET4, T3FREE, THYROIDAB in the last 72 hours. Anemia Panel: No results for input(s): VITAMINB12, FOLATE, FERRITIN, TIBC, IRON, RETICCTPCT in the last 72 hours. Sepsis Labs: Recent Labs  Lab 03/20/20 1445 03/20/20 1720  LATICACIDVEN 0.9 0.8    Recent Results (from the past 240 hour(s))  Blood Culture (routine x 2)     Status: None   Collection Time: 03/13/20 10:43 AM   Specimen: BLOOD  Result Value Ref Range Status   Specimen Description   Final    BLOOD LEFT ARM Performed at [facility] [address] [place], [place] [address]    Special Requests   Final    BOTTLES DRAWN AEROBIC AND ANAEROBIC Blood Culture results may not be optimal  due to an inadequate volume of blood received in culture bottles Performed at [facility] [address], [place], [place] [address]    Culture   Final    NO GROWTH 5 DAYS Performed at [facility], [place] [address], [place], [place] [address]    Report Status 03/18/2020 FINAL  Final  Blood Culture (routine x 2)     Status: None   Collection Time: 03/13/20 10:48 AM   Specimen: BLOOD  Result Value Ref Range Status   Specimen Description   Final    BLOOD LEFT ARM Performed at [facility] [address] [place], [place] [address]    Special Requests   Final    BOTTLES DRAWN AEROBIC AND ANAEROBIC Blood Culture adequate volume Performed at [facility] [address] [place], [place] [address]    Culture   Final    NO GROWTH 5 DAYS Performed at [facility], [place] [address] [place], [place] [address]    Report Status 03/18/2020 FINAL  Final  Resp Panel by RT-PCR (Flu A&B, Covid) Nasopharyngeal Swab     Status: None   Collection Time: 03/13/20 11:08  AM   Specimen: Nasopharyngeal Swab; Nasopharyngeal(NP) swabs in vial transport medium  Result Value Ref Range Status   SARS Coronavirus 2 by RT PCR NEGATIVE NEGATIVE Final    Comment: (NOTE) SARS-CoV-2 target nucleic acids are NOT DETECTED.  The SARS-CoV-2 RNA is generally detectable in upper respiratory specimens during the acute phase of infection. The lowest concentration of SARS-CoV-2 viral copies this assay can detect is 138 copies/mL. A negative result does not preclude SARS-Cov-2 infection and should not be used as the sole basis for treatment or other patient management decisions. A negative result may occur with  improper specimen collection/handling, submission of specimen other than nasopharyngeal swab, presence of viral mutation(s) within the areas targeted by this assay, and inadequate number of viral copies(<138 copies/mL). A negative result must be combined with clinical observations, patient history, and epidemiological information. The expected result is Negative.  Fact Sheet for Patients:  EntrepreneurPulse.com.au  Fact Sheet for Healthcare Providers:  IncredibleEmployment.be  This test is no t yet approved or cleared by the [place] FDA and  has been authorized for detection and/or diagnosis of SARS-CoV-2 by FDA under an Emergency Use Authorization (EUA). This EUA will remain  in effect (meaning this test can be used) for the duration of the COVID-19 declaration under Section 564(b)(1) of the Act, 21 U.S.C.section 360bbb-3(b)(1), unless the authorization is terminated  or revoked sooner.       Influenza A by PCR NEGATIVE NEGATIVE Final   Influenza B by PCR NEGATIVE NEGATIVE Final    Comment: (NOTE) The Xpert Xpress SARS-CoV-2/FLU/RSV plus assay is intended as an aid in the diagnosis of influenza from Nasopharyngeal swab specimens and should not be used as a sole basis for treatment. Nasal washings and aspirates are  unacceptable for Xpert Xpress SARS-CoV-2/FLU/RSV testing.  Fact Sheet for Patients: EntrepreneurPulse.com.au  Fact Sheet for Healthcare Providers: IncredibleEmployment.be  This test is not yet approved or cleared by the [place] FDA and has been authorized for detection and/or diagnosis of SARS-CoV-2 by FDA under an Emergency Use Authorization (EUA). This EUA will remain in effect (meaning this test can be used) for the duration of the COVID-19 declaration under Section 564(b)(1) of the Act, 21 U.S.C. section 360bbb-3(b)(1), unless the authorization is terminated or revoked.  Performed at [facility], [place] [address], [place], [place] [address]   Urine culture     Status: None   Collection Time: 03/13/20  2:00 PM   Specimen: In/Out Cath Urine  Result Value Ref Range Status   Specimen Description   Final    IN/OUT CATH URINE Performed at [facility] [address] [place], [place] [address]    Special Requests   Final    NONE Performed at [facility], [place] [address], [place], [place] [address]    Culture   Final    NO GROWTH Performed at [facility], [place] [address], [place], [place] [address]    Report Status 03/15/2020 FINAL  Final  MRSA PCR Screening     Status: None   Collection Time: 03/15/20  9:59 AM   Specimen: Nasopharyngeal  Result Value Ref Range Status   MRSA by PCR NEGATIVE NEGATIVE Final    Comment:        The GeneXpert MRSA Assay (FDA approved for NASAL specimens only), is one component of a comprehensive MRSA colonization surveillance program. It is not intended to diagnose MRSA infection nor to guide or monitor treatment for MRSA infections. Performed at [facility], [place] [address], [place], [place] [address]   Blood culture (routine x 2)     Status: None (Preliminary result)   Collection Time: 03/20/20  2:30 PM    Specimen: BLOOD  Result Value Ref Range Status   Specimen Description   Final    BLOOD RIGHT ANTECUBITAL Performed at [facility] [address] [place], [place] [address]    Special Requests   Final    BOTTLES DRAWN AEROBIC AND ANAEROBIC Blood Culture adequate volume Performed at [facility] [address], [place], [place] [address]    Culture   Final    NO GROWTH 2 DAYS Performed at [facility] [address] [place],  [address]    Report Status PENDING  Incomplete  Resp Panel by RT-PCR (Flu A&B, Covid) Urine, Clean Catch     Status: None   Collection Time: 03/20/20  2:30 PM   Specimen: Urine, Clean Catch; Nasopharyngeal(NP) swabs in vial transport medium  Result Value Ref Range Status   SARS Coronavirus 2 by RT PCR NEGATIVE NEGATIVE Final    Comment: (NOTE) SARS-CoV-2 target nucleic acids are NOT DETECTED.  The SARS-CoV-2 RNA is generally detectable in upper respiratory specimens during the acute phase of infection. The lowest concentration of SARS-CoV-2 viral copies this assay can detect is 138 copies/mL. A negative result does not preclude SARS-Cov-2 infection and should not be used as the sole basis for treatment or other patient management decisions. A negative result may occur with  improper specimen collection/handling, submission of specimen other than nasopharyngeal swab, presence of viral mutation(s) within the areas targeted by this assay, and inadequate number of viral copies(<138 copies/mL). A negative result must be combined with clinical observations, patient history, and epidemiological information. The expected result is Negative.  Fact Sheet for Patients:  EntrepreneurPulse.com.au  Fact Sheet for Healthcare Providers:  IncredibleEmployment.be  This test is no t yet approved or cleared by the [place] FDA and  has been authorized for detection and/or  diagnosis of SARS-CoV-2 by FDA under an Emergency Use Authorization (EUA). This EUA will remain  in effect (meaning this test can be used) for the duration of the COVID-19 declaration under Section 564(b)(1) of the Act,  21 U.S.C.section 360bbb-3(b)(1), unless the authorization is terminated  or revoked sooner.       Influenza A by PCR NEGATIVE NEGATIVE Final   Influenza B by PCR NEGATIVE NEGATIVE Final    Comment: (NOTE) The Xpert Xpress SARS-CoV-2/FLU/RSV plus assay is intended as an aid in the diagnosis of influenza from Nasopharyngeal swab specimens and should not be used as a sole basis for treatment. Nasal washings and aspirates are unacceptable for Xpert Xpress SARS-CoV-2/FLU/RSV testing.  Fact Sheet for Patients: EntrepreneurPulse.com.au  Fact Sheet for Healthcare Providers: IncredibleEmployment.be  This test is not yet approved or cleared by the [place] FDA and has been authorized for detection and/or diagnosis of SARS-CoV-2 by FDA under an Emergency Use Authorization (EUA). This EUA will remain in effect (meaning this test can be used) for the duration of the COVID-19 declaration under Section 564(b)(1) of the Act, 21 U.S.C. section 360bbb-3(b)(1), unless the authorization is terminated or revoked.  Performed at [facility], [place] [address], [place], [place] [address]   Blood culture (routine x 2)     Status: None (Preliminary result)   Collection Time: 03/20/20  2:44 PM   Specimen: BLOOD  Result Value Ref Range Status   Specimen Description   Final    BLOOD LEFT ANTECUBITAL Performed at [facility] [address] [place], [place] [address]    Special Requests   Final    BOTTLES DRAWN AEROBIC AND ANAEROBIC Blood Culture adequate volume Performed at [facility] [address], [place], [place] [address]    Culture   Final    NO GROWTH 2 DAYS Performed at  [facility] [address], [place],  [address]    Report Status PENDING  Incomplete         Radiology Studies: DG Chest 2 View  Result Date: 03/20/2020 CLINICAL DATA:  Shortness of breath. Recent sepsis and hospitalization. EXAM: CHEST - 2 VIEW COMPARISON:  03/13/2020 FINDINGS: Extensive airspace disease throughout the right lung, similar to prior study. Small right pleural effusion. Patchy nodular opacities are seen in the left lung as seen on prior CT. Heart is normal size. No acute bony abnormality. IMPRESSION: Extensive airspace disease throughout the right lung concerning for pneumonia. Small right pleural effusion. Findings similar to prior study. Patchy vague ground-glass nodular opacities throughout the left lung as seen on prior CT. No real change since prior study. Electronically Signed   By: Rolm Baptise M.D.   On: 03/20/2020 10:42   ECHOCARDIOGRAM COMPLETE  Result Date: 03/21/2020    ECHOCARDIOGRAM REPORT   Patient Name:   Richard Li Date of Exam: 03/21/2020 Medical Rec #:  [id number]       Height:       70.5 in Accession #:    [id number]      Weight:       190.9 lb Date of Birth:  01-19-1961       BSA:          2.057 m Patient Age:    79 years        BP:           113/72 mmHg Patient Gender: M               HR:           89 bpm. Exam Location:  Inpatient Procedure: 2D Echo, Cardiac Doppler, Color Doppler and Intracardiac  Opacification Agent Indications:    Dyspnea 786.09 / R06.00  History:        Patient has no prior history of Echocardiogram examinations.                 Risk Factors:Hypertension, Dyslipidemia and Former Smoker.  Sonographer:    Vickie Epley RDCS Referring Phys: [id number] Harold Hedge  Sonographer Comments: Suboptimal parasternal window. IMPRESSIONS  1. Left ventricular ejection fraction, by estimation, is 60 to 65%. The left ventricle has normal function. The left ventricle has no regional wall motion abnormalities. Left ventricular  diastolic parameters are consistent with Grade I diastolic dysfunction (impaired relaxation). Elevated left ventricular end-diastolic pressure.  2. Right ventricular systolic function is normal. The right ventricular size is normal.  3. A small pericardial effusion is present. The pericardial effusion is posterior to the left ventricle. There is no evidence of cardiac tamponade.  4. The mitral valve is grossly normal. Trivial mitral valve regurgitation.  5. The aortic valve was not well visualized. Aortic valve regurgitation is not visualized.  6. Aortic dilatation noted. There is borderline dilatation of the ascending aorta, measuring 38 mm. There is mild dilatation of the aortic root, measuring 41 mm.  7. The inferior vena cava is normal in size with <50% respiratory variability, suggesting right atrial pressure of 8 mmHg. FINDINGS  Left Ventricle: Left ventricular ejection fraction, by estimation, is 60 to 65%. The left ventricle has normal function. The left ventricle has no regional wall motion abnormalities. Definity contrast agent was given IV to delineate the left ventricular  endocardial borders. The left ventricular internal cavity size was normal in size. There is no left ventricular hypertrophy. Left ventricular diastolic parameters are consistent with Grade I diastolic dysfunction (impaired relaxation). Elevated left ventricular end-diastolic pressure. Right Ventricle: The right ventricular size is normal. No increase in right ventricular wall thickness. Right ventricular systolic function is normal. Left Atrium: Left atrial size was normal in size. Right Atrium: Right atrial size was normal in size. Pericardium: A small pericardial effusion is present. The pericardial effusion is posterior to the left ventricle. There is no evidence of cardiac tamponade. Mitral Valve: The mitral valve is grossly normal. Trivial mitral valve regurgitation. Tricuspid Valve: The tricuspid valve is grossly normal.  Tricuspid valve regurgitation is trivial. Aortic Valve: The aortic valve was not well visualized. Aortic valve regurgitation is not visualized. Pulmonic Valve: The pulmonic valve was grossly normal. Pulmonic valve regurgitation is not visualized. Aorta: Aortic dilatation noted. There is borderline dilatation of the ascending aorta, measuring 38 mm. There is mild dilatation of the aortic root, measuring 41 mm. Venous: The inferior vena cava is normal in size with less than 50% respiratory variability, suggesting right atrial pressure of 8 mmHg. IAS/Shunts: No atrial level shunt detected by color flow Doppler.  LEFT VENTRICLE PLAX 2D LVIDd:         5.10 cm      Diastology LVIDs:         3.90 cm      LV e' medial:    7.38 cm/s LV PW:         0.70 cm      LV E/e' medial:  18.6 LV IVS:        0.70 cm      LV e' lateral:   8.44 cm/s LVOT diam:     2.50 cm      LV E/e' lateral: 16.2 LV SV:         129  LV SV Index:   63 LVOT Area:     4.91 cm  LV Volumes (MOD) LV vol d, MOD A2C: 190.0 ml LV vol d, MOD A4C: 180.0 ml LV vol s, MOD A2C: 66.1 ml LV vol s, MOD A4C: 62.7 ml LV SV MOD A2C:     123.9 ml LV SV MOD A4C:     180.0 ml LV SV MOD BP:      121.8 ml RIGHT VENTRICLE RV S prime:     10.20 cm/s TAPSE (M-mode): 2.1 cm LEFT ATRIUM             Index       RIGHT ATRIUM           Index LA diam:        4.50 cm 2.19 cm/m  RA Area:     12.50 cm LA Vol (A2C):   52.2 ml 25.37 ml/m RA Volume:   25.70 ml  12.49 ml/m LA Vol (A4C):   48.1 ml 23.38 ml/m LA Biplane Vol: 51.3 ml 24.94 ml/m  AORTIC VALVE LVOT Vmax:   113.00 cm/s LVOT Vmean:  77.300 cm/s LVOT VTI:    0.263 m  AORTA Ao Root diam: 3.90 cm Ao Asc diam:  3.80 cm MITRAL VALVE MV Area (PHT): 5.13 cm     SHUNTS MV Decel Time: 148 msec     Systemic VTI:  0.26 m MV E velocity: 137.00 cm/s  Systemic Diam: 2.50 cm MV A velocity: 95.50 cm/s MV E/A ratio:  1.43 Lyman Bishop MD Electronically signed by Lyman Bishop MD Signature Date/Time: 03/21/2020/12:33:26 PM    Final     Scheduled Meds: . dextromethorphan-guaiFENesin  1 tablet Oral BID  . fenofibrate  160 mg Oral Daily  . folic acid  1 mg Oral Daily  . losartan  100 mg Oral Daily  . potassium chloride SA  20 mEq Oral Daily  . sodium chloride flush  3 mL Intravenous Q12H  . traZODone  50 mg Oral QHS   Continuous Infusions: . azithromycin Stopped (03/21/20 1642)  . cefTRIAXone (ROCEPHIN)  IV Stopped (03/21/20 1531)     LOS: 0 days   Time spent: 5min  Hazen Brumett C Kivon Aprea, DO [facility]  If 7PM-7AM, please contact night-coverage [email or web address]  03/22/2020, 8:09 AM

## 2020-03-23 ENCOUNTER — Ambulatory Visit: Payer: BC Managed Care – PPO

## 2020-03-23 ENCOUNTER — Inpatient Hospital Stay: Payer: BC Managed Care – PPO

## 2020-03-23 ENCOUNTER — Ambulatory Visit
Admission: RE | Admit: 2020-03-23 | Discharge: 2020-03-23 | Disposition: A | Discharge status: Home / Self Care | Payer: BC Managed Care – PPO | Source: Ambulatory Visit | Attending: Radiation Oncology | Admitting: Radiation Oncology

## 2020-03-23 ENCOUNTER — Observation Stay (HOSPITAL_COMMUNITY): Payer: BC Managed Care – PPO

## 2020-03-23 ENCOUNTER — Inpatient Hospital Stay: Payer: BC Managed Care – PPO | Admitting: Physician Assistant

## 2020-03-23 DIAGNOSIS — E538 Deficiency of other specified B group vitamins: Secondary | ICD-10-CM | POA: Diagnosis present

## 2020-03-23 DIAGNOSIS — J449 Chronic obstructive pulmonary disease, unspecified: Secondary | ICD-10-CM | POA: Diagnosis present

## 2020-03-23 DIAGNOSIS — Z8709 Personal history of other diseases of the respiratory system: Secondary | ICD-10-CM | POA: Diagnosis not present

## 2020-03-23 DIAGNOSIS — Z87891 Personal history of nicotine dependence: Secondary | ICD-10-CM | POA: Diagnosis not present

## 2020-03-23 DIAGNOSIS — Z87442 Personal history of urinary calculi: Secondary | ICD-10-CM | POA: Diagnosis not present

## 2020-03-23 DIAGNOSIS — Z79899 Other long term (current) drug therapy: Secondary | ICD-10-CM | POA: Diagnosis not present

## 2020-03-23 DIAGNOSIS — D849 Immunodeficiency, unspecified: Secondary | ICD-10-CM | POA: Diagnosis present

## 2020-03-23 DIAGNOSIS — D649 Anemia, unspecified: Secondary | ICD-10-CM | POA: Diagnosis not present

## 2020-03-23 DIAGNOSIS — E8770 Fluid overload, unspecified: Secondary | ICD-10-CM | POA: Diagnosis present

## 2020-03-23 DIAGNOSIS — K219 Gastro-esophageal reflux disease without esophagitis: Secondary | ICD-10-CM | POA: Diagnosis present

## 2020-03-23 DIAGNOSIS — J309 Allergic rhinitis, unspecified: Secondary | ICD-10-CM | POA: Diagnosis present

## 2020-03-23 DIAGNOSIS — E785 Hyperlipidemia, unspecified: Secondary | ICD-10-CM | POA: Diagnosis present

## 2020-03-23 DIAGNOSIS — C3490 Malignant neoplasm of unspecified part of unspecified bronchus or lung: Secondary | ICD-10-CM | POA: Diagnosis present

## 2020-03-23 DIAGNOSIS — Z20822 Contact with and (suspected) exposure to covid-19: Secondary | ICD-10-CM | POA: Diagnosis present

## 2020-03-23 DIAGNOSIS — E663 Overweight: Secondary | ICD-10-CM | POA: Diagnosis present

## 2020-03-23 DIAGNOSIS — Z825 Family history of asthma and other chronic lower respiratory diseases: Secondary | ICD-10-CM | POA: Diagnosis not present

## 2020-03-23 DIAGNOSIS — R0602 Shortness of breath: Secondary | ICD-10-CM | POA: Diagnosis present

## 2020-03-23 DIAGNOSIS — D509 Iron deficiency anemia, unspecified: Secondary | ICD-10-CM | POA: Diagnosis present

## 2020-03-23 DIAGNOSIS — I313 Pericardial effusion (noninflammatory): Secondary | ICD-10-CM | POA: Diagnosis present

## 2020-03-23 DIAGNOSIS — Z6829 Body mass index (BMI) 29.0-29.9, adult: Secondary | ICD-10-CM | POA: Diagnosis not present

## 2020-03-23 DIAGNOSIS — J690 Pneumonitis due to inhalation of food and vomit: Secondary | ICD-10-CM | POA: Diagnosis present

## 2020-03-23 DIAGNOSIS — F411 Generalized anxiety disorder: Secondary | ICD-10-CM | POA: Diagnosis present

## 2020-03-23 DIAGNOSIS — I10 Essential (primary) hypertension: Secondary | ICD-10-CM | POA: Diagnosis present

## 2020-03-23 DIAGNOSIS — Z8249 Family history of ischemic heart disease and other diseases of the circulatory system: Secondary | ICD-10-CM | POA: Diagnosis not present

## 2020-03-23 LAB — CBC
HCT: 23.7 % — ABNORMAL LOW (ref 39.0–52.0)
Hemoglobin: 7.8 g/dL — ABNORMAL LOW (ref 13.0–17.0)
MCH: 29.7 pg (ref 26.0–34.0)
MCHC: 32.9 g/dL (ref 30.0–36.0)
MCV: 90.1 fL (ref 80.0–100.0)
Platelets: 306 10*3/uL (ref 150–400)
RBC: 2.63 MIL/uL — ABNORMAL LOW (ref 4.22–5.81)
RDW: 13.3 % (ref 11.5–15.5)
WBC: 6.6 10*3/uL (ref 4.0–10.5)
nRBC: 0 % (ref 0.0–0.2)

## 2020-03-23 LAB — BASIC METABOLIC PANEL
Anion gap: 10 (ref 5–15)
BUN: 8 mg/dL (ref 6–20)
CO2: 25 mmol/L (ref 22–32)
Calcium: 8.3 mg/dL — ABNORMAL LOW (ref 8.9–10.3)
Chloride: 101 mmol/L (ref 98–111)
Creatinine, Ser: 0.62 mg/dL (ref 0.61–1.24)
GFR, Estimated: >60 mL/min (ref >60)
Glucose, Bld: 91 mg/dL (ref 70–99)
Potassium: 3.6 mmol/L (ref 3.5–5.1)
Sodium: 136 mmol/L (ref 135–145)

## 2020-03-23 MED ORDER — AZITHROMYCIN 250 MG PO TABS
500.0000 mg | ORAL_TABLET | Freq: Every day | ORAL | Status: AC
  Administered 2020-03-23 – 2020-03-24 (×2): 500 mg via ORAL
  Filled 2020-03-23 (×2): qty 2

## 2020-03-23 NOTE — Progress Notes (Signed)
PROGRESS NOTE    Richard Li  ACZ:[id number] DOB: April 11, 1961 DOA: 03/20/2020 PCP: [name], DO   Brief Narrative:  This is a 59 year old male with past medical history of NSCLC, asthma, hypertension, recent admission for septic shock secondary to multifocal pneumonia from 11/19-11/23 who presented to the ED with persistent shortness of breath, cough and generalized weakness.  At his recent hospitalization he completed 5 days of antibiotic therapy with vancomycin and cefepime and was discharged without any antibiotics.  States that since he has been home he has not felt any better and has had fevers intermittently up to 102 F.  Also has felt short of breath on exertion and weak.  He was offered a blood transfusion at his last hospitalization but declined due to concerns of any adverse reactions and was started on folic acid due to deficiency on his last visit.  Has been taking his folic acid.  Also admits to diffuse swelling since his hospitalization and weight gain but denies any known history of heart failure   Assessment & Plan:   Principal Problem:   Symptomatic anemia Active Problems:   Essential hypertension   CAP (community acquired pneumonia)   Volume overload   Acute symptomatic anemia, rule out acute blood loss vs worsening chronic iron deficiency anemia/anemia of chronic disease Hb slowly trending downward without bleeding source FOBT negative Recent iron studies showing elevated ferritin likely acute phase reactant and with folic acid deficiency Continue folic acid Transfused 1 unit PRBC at admission, tolerated well - hold repeat transfusion for now  Community-acquired pneumonia, POA Questionable aspiration pneumonia vs pneumonitis Concurrent immunocompromised state Continue ceftriaxone and azithromycinfor 5 day course Speech evaluation indicates high risk for aspiration - educated on proper swallowing technique to reduce aspiration - patient and wife indicate  occasional choking with fluids which likely explains his acute recurrent worsening after completion of antibiotics Patient does not meet sepsis criteria - no leukocytosis, or fever noted since admission  Volume overload Lasix 40 mg IV x 1 at admission Echo - grade 1 diastolic dysfunction; small pericardial effusion without tamponade - fairly unremarkable otherwise  Daily weights and intake/output  Hypertension Continue home meds  DVT prophylaxis: SCDs Code Status: Full code Family Communication: At bedside  Status is: Inpatient  Dispo: The patient is from: Home              Anticipated d/c is to: Home              Anticipated d/c date is: 24-48 hours              Patient currently not medically stable for discharge given ongoing cough, shortness of breath sputum production concerning for pneumonia with failure of previous antibiotic regimen, currently requires IV antibiotics  Consultants:   None  Procedures:   None planned  Antimicrobials:  Azithromycin, ceftriaxone  Subjective: No acute issues or events overnight, patient has moderate and ongoing dyspnea with exertion, improving at rest, cough sputum production also improving but not yet resolved.  Patient continues to feel moderately fatigued and weak - minimal improvement in ambulation over the past 24h.  Denies stool color changes, somewhat hesitant to be evaluated with physical therapy given his concern for being weak.  Objective: Vitals:   03/22/20 0618 03/22/20 1443 03/22/20 2208 03/23/20 0548  BP: (!) 121/59 133/69 122/69 137/69  Pulse: 82 87 96 85  Resp: 20 15 17 18   Temp: 98.8 F (37.1 C) 98.4 F (36.9 C) 98.2 F (36.8 C)  98.2 F (36.8 C)  TempSrc: Oral Oral Oral Oral  SpO2: 94% 97% 94% 92%  Weight:    89.9 kg  Height:        Intake/Output Summary (Last 24 hours) at 03/23/2020 0751 Last data filed at 03/22/2020 1500 Gross per 24 hour  Intake 118 ml  Output 250 ml  Net -132 ml   Filed Weights    03/20/20 1013 03/21/20 0507 03/23/20 0548  Weight: 94.1 kg 86.6 kg 89.9 kg    Examination:  General exam: Appears calm and comfortable  Respiratory system: Coarse breath sounds right lung fields diffusely otherwise without wheeze or rales Cardiovascular system: S1 & S2 heard, RRR. No JVD, murmurs, rubs, gallops or clicks. No pedal edema. Gastrointestinal system: Abdomen is nondistended, soft and nontender. No organomegaly or masses felt. Normal bowel sounds heard. Central nervous system: Alert and oriented. No focal neurological deficits. Extremities: Symmetric 5 x 5 power. Skin: No rashes, lesions or ulcers Psychiatry: Judgement and insight appear normal. Mood & affect appropriate.     Data Reviewed: I have personally reviewed following labs and imaging studies  CBC: Recent Labs  Lab 03/17/20 0526 03/20/20 1050 03/21/20 0626 03/22/20 0726 03/22/20 1234  WBC  --  6.5 5.9 5.4  --   NEUTROABS  --  5.1  --   --   --   HGB 7.4* 7.3* 8.7* 7.8* 8.9*  HCT 22.0* 22.0* 25.9* 23.3* 27.5*  MCV  --  91.3 89.9 89.6  --   PLT  --  264 314 298  --    Basic Metabolic Panel: Recent Labs  Lab 03/17/20 0526 03/20/20 1050 03/21/20 0626 03/22/20 0726  NA 137 135 136 139  K 3.2* 3.7 3.9 4.3  CL 103 100 100 102  CO2 27 26 27 29   GLUCOSE 92 97 133* 93  BUN 10 8 11 13   CREATININE 0.66 0.70 0.50* 0.78  CALCIUM 7.9* 8.2* 8.2* 8.3*  MG 1.8  --   --   --   PHOS 1.9*  --   --   --    GFR: Estimated Creatinine Clearance: 113.2 mL/min (by C-G formula based on SCr of 0.78 mg/dL). Liver Function Tests: Recent Labs  Lab 03/17/20 0526  ALBUMIN 2.0*   No results for input(s): LIPASE, AMYLASE in the last 168 hours. No results for input(s): AMMONIA in the last 168 hours. Coagulation Profile: No results for input(s): INR, PROTIME in the last 168 hours. Cardiac Enzymes: No results for input(s): CKTOTAL, CKMB, CKMBINDEX, TROPONINI in the last 168 hours. BNP (last 3 results) No results for  input(s): PROBNP in the last 8760 hours. HbA1C: No results for input(s): HGBA1C in the last 72 hours. CBG: No results for input(s): GLUCAP in the last 168 hours. Lipid Profile: No results for input(s): CHOL, HDL, LDLCALC, TRIG, CHOLHDL, LDLDIRECT in the last 72 hours. Thyroid Function Tests: No results for input(s): TSH, T4TOTAL, FREET4, T3FREE, THYROIDAB in the last 72 hours. Anemia Panel: No results for input(s): VITAMINB12, FOLATE, FERRITIN, TIBC, IRON, RETICCTPCT in the last 72 hours. Sepsis Labs: Recent Labs  Lab 03/20/20 1445 03/20/20 1720  LATICACIDVEN 0.9 0.8    Recent Results (from the past 240 hour(s))  Blood Culture (routine x 2)     Status: None   Collection Time: 03/13/20 10:43 AM   Specimen: BLOOD  Result Value Ref Range Status   Specimen Description   Final    BLOOD LEFT ARM Performed at [facility]  [place], [place] [address]    Special Requests   Final    BOTTLES DRAWN AEROBIC AND ANAEROBIC Blood Culture results may not be optimal due to an inadequate volume of blood received in culture bottles Performed at [facility] [address], [place], [place] [address]    Culture   Final    NO GROWTH 5 DAYS Performed at [facility], [place] [address] [place], [place] [address]    Report Status 03/18/2020 FINAL  Final  Blood Culture (routine x 2)     Status: None   Collection Time: 03/13/20 10:48 AM   Specimen: BLOOD  Result Value Ref Range Status   Specimen Description   Final    BLOOD LEFT ARM Performed at [facility] [address] [place], [place] [address]    Special Requests   Final    BOTTLES DRAWN AEROBIC AND ANAEROBIC Blood Culture adequate volume Performed at [facility] [address] [place], [place] [address]    Culture   Final    NO GROWTH 5 DAYS Performed at [facility], [place] [address], [place], [place] [address]    Report Status  03/18/2020 FINAL  Final  Resp Panel by RT-PCR (Flu A&B, Covid) Nasopharyngeal Swab     Status: None   Collection Time: 03/13/20 11:08 AM   Specimen: Nasopharyngeal Swab; Nasopharyngeal(NP) swabs in vial transport medium  Result Value Ref Range Status   SARS Coronavirus 2 by RT PCR NEGATIVE NEGATIVE Final    Comment: (NOTE) SARS-CoV-2 target nucleic acids are NOT DETECTED.  The SARS-CoV-2 RNA is generally detectable in upper respiratory specimens during the acute phase of infection. The lowest concentration of SARS-CoV-2 viral copies this assay can detect is 138 copies/mL. A negative result does not preclude SARS-Cov-2 infection and should not be used as the sole basis for treatment or other patient management decisions. A negative result may occur with  improper specimen collection/handling, submission of specimen other than nasopharyngeal swab, presence of viral mutation(s) within the areas targeted by this assay, and inadequate number of viral copies(<138 copies/mL). A negative result must be combined with clinical observations, patient history, and epidemiological information. The expected result is Negative.  Fact Sheet for Patients:  EntrepreneurPulse.com.au  Fact Sheet for Healthcare Providers:  IncredibleEmployment.be  This test is no t yet approved or cleared by the [place] FDA and  has been authorized for detection and/or diagnosis of SARS-CoV-2 by FDA under an Emergency Use Authorization (EUA). This EUA will remain  in effect (meaning this test can be used) for the duration of the COVID-19 declaration under Section 564(b)(1) of the Act, 21 U.S.C.section 360bbb-3(b)(1), unless the authorization is terminated  or revoked sooner.       Influenza A by PCR NEGATIVE NEGATIVE Final   Influenza B by PCR NEGATIVE NEGATIVE Final    Comment: (NOTE) The Xpert Xpress SARS-CoV-2/FLU/RSV plus assay is intended as an aid in the diagnosis of  influenza from Nasopharyngeal swab specimens and should not be used as a sole basis for treatment. Nasal washings and aspirates are unacceptable for Xpert Xpress SARS-CoV-2/FLU/RSV testing.  Fact Sheet for Patients: EntrepreneurPulse.com.au  Fact Sheet for Healthcare Providers: IncredibleEmployment.be  This test is not yet approved or cleared by the [place] FDA and has been authorized for detection and/or diagnosis of SARS-CoV-2 by FDA under an Emergency Use Authorization (EUA). This EUA will remain in effect (meaning this test can be used) for the  duration of the COVID-19 declaration under Section 564(b)(1) of the Act, 21 U.S.C. section 360bbb-3(b)(1), unless the authorization is terminated or revoked.  Performed at [facility], [place] [address], [place], [place] [address]   Urine culture     Status: None   Collection Time: 03/13/20  2:00 PM   Specimen: In/Out Cath Urine  Result Value Ref Range Status   Specimen Description   Final    IN/OUT CATH URINE Performed at [facility] [address], [place], [place] [address]    Special Requests   Final    NONE Performed at [facility], [place] [address], [place], [place] [address]    Culture   Final    NO GROWTH Performed at [facility], [place] [address], [place], [place] [address]    Report Status 03/15/2020 FINAL  Final  MRSA PCR Screening     Status: None   Collection Time: 03/15/20  9:59 AM   Specimen: Nasopharyngeal  Result Value Ref Range Status   MRSA by PCR NEGATIVE NEGATIVE Final    Comment:        The GeneXpert MRSA Assay (FDA approved for NASAL specimens only), is one component of a comprehensive MRSA colonization surveillance program. It is not intended to diagnose MRSA infection nor to guide or monitor treatment for MRSA infections. Performed at [facility], [place] [address]  [place], [place] [address]   Blood culture (routine x 2)     Status: None (Preliminary result)   Collection Time: 03/20/20  2:30 PM   Specimen: BLOOD  Result Value Ref Range Status   Specimen Description   Final    BLOOD RIGHT ANTECUBITAL Performed at [facility] [address] [place], [place] [address]    Special Requests   Final    BOTTLES DRAWN AEROBIC AND ANAEROBIC Blood Culture adequate volume Performed at [facility] [address], [place], [place] [address]    Culture   Final    NO GROWTH 2 DAYS Performed at [facility] [address] [place],  [address]    Report Status PENDING  Incomplete  Resp Panel by RT-PCR (Flu A&B, Covid) Urine, Clean Catch     Status: None   Collection Time: 03/20/20  2:30 PM   Specimen: Urine, Clean Catch; Nasopharyngeal(NP) swabs in vial transport medium  Result Value Ref Range Status   SARS Coronavirus 2 by RT PCR NEGATIVE NEGATIVE Final    Comment: (NOTE) SARS-CoV-2 target nucleic acids are NOT DETECTED.  The SARS-CoV-2 RNA is generally detectable in upper respiratory specimens during the acute phase of infection. The lowest concentration of SARS-CoV-2 viral copies this assay can detect is 138 copies/mL. A negative result does not preclude SARS-Cov-2 infection and should not be used as the sole basis for treatment or other patient management decisions. A negative result may occur with  improper specimen collection/handling, submission of specimen other than nasopharyngeal swab, presence of viral mutation(s) within the areas targeted by this assay, and inadequate number of viral copies(<138 copies/mL). A negative result must be combined with clinical observations, patient history, and epidemiological information. The expected result is Negative.  Fact Sheet for Patients:  EntrepreneurPulse.com.au  Fact Sheet for Healthcare Providers:    IncredibleEmployment.be  This test is no t yet approved or cleared by the [place] FDA and  has been authorized for detection and/or diagnosis of SARS-CoV-2 by FDA under an Emergency Use  Authorization (EUA). This EUA will remain  in effect (meaning this test can be used) for the duration of the COVID-19 declaration under Section 564(b)(1) of the Act, 21 U.S.C.section 360bbb-3(b)(1), unless the authorization is terminated  or revoked sooner.       Influenza A by PCR NEGATIVE NEGATIVE Final   Influenza B by PCR NEGATIVE NEGATIVE Final    Comment: (NOTE) The Xpert Xpress SARS-CoV-2/FLU/RSV plus assay is intended as an aid in the diagnosis of influenza from Nasopharyngeal swab specimens and should not be used as a sole basis for treatment. Nasal washings and aspirates are unacceptable for Xpert Xpress SARS-CoV-2/FLU/RSV testing.  Fact Sheet for Patients: EntrepreneurPulse.com.au  Fact Sheet for Healthcare Providers: IncredibleEmployment.be  This test is not yet approved or cleared by the [place] FDA and has been authorized for detection and/or diagnosis of SARS-CoV-2 by FDA under an Emergency Use Authorization (EUA). This EUA will remain in effect (meaning this test can be used) for the duration of the COVID-19 declaration under Section 564(b)(1) of the Act, 21 U.S.C. section 360bbb-3(b)(1), unless the authorization is terminated or revoked.  Performed at [facility], [place] [address], [place], [place] [address]   Blood culture (routine x 2)     Status: None (Preliminary result)   Collection Time: 03/20/20  2:44 PM   Specimen: BLOOD  Result Value Ref Range Status   Specimen Description   Final    BLOOD LEFT ANTECUBITAL Performed at [facility] [address], [place], [place] [address]    Special Requests   Final    BOTTLES DRAWN AEROBIC AND ANAEROBIC Blood Culture  adequate volume Performed at [facility] [address], [place], [place] [address]    Culture   Final    NO GROWTH 2 DAYS Performed at [facility] [address] [place], [place] [address]    Report Status PENDING  Incomplete    Radiology Studies: ECHOCARDIOGRAM COMPLETE  Result Date: 03/21/2020    ECHOCARDIOGRAM REPORT   Patient Name:   Richard Li Date of Exam: 03/21/2020 Medical Rec #:  [id number]       Height:       70.5 in Accession #:    [id number]      Weight:       190.9 lb Date of Birth:  July 09, 1960       BSA:          2.057 m Patient Age:    56 years        BP:           113/72 mmHg Patient Gender: M               HR:           89 bpm. Exam Location:  Inpatient Procedure: 2D Echo, Cardiac Doppler, Color Doppler and Intracardiac            Opacification Agent Indications:    Dyspnea 786.09 / R06.00  History:        Patient has no prior history of Echocardiogram examinations.                 Risk Factors:Hypertension, Dyslipidemia and Former Smoker.  Sonographer:    Vickie Epley RDCS Referring Phys: [id number] Harold Hedge  Sonographer Comments: Suboptimal parasternal window. IMPRESSIONS  1. Left ventricular ejection fraction, by estimation, is 60 to 65%. The left ventricle has normal function. The left ventricle has no regional wall  motion abnormalities. Left ventricular diastolic parameters are consistent with Grade I diastolic dysfunction (impaired relaxation). Elevated left ventricular end-diastolic pressure.  2. Right ventricular systolic function is normal. The right ventricular size is normal.  3. A small pericardial effusion is present. The pericardial effusion is posterior to the left ventricle. There is no evidence of cardiac tamponade.  4. The mitral valve is grossly normal. Trivial mitral valve regurgitation.  5. The aortic valve was not well visualized. Aortic valve regurgitation is not visualized.  6. Aortic dilatation noted. There is borderline  dilatation of the ascending aorta, measuring 38 mm. There is mild dilatation of the aortic root, measuring 41 mm.  7. The inferior vena cava is normal in size with <50% respiratory variability, suggesting right atrial pressure of 8 mmHg. FINDINGS  Left Ventricle: Left ventricular ejection fraction, by estimation, is 60 to 65%. The left ventricle has normal function. The left ventricle has no regional wall motion abnormalities. Definity contrast agent was given IV to delineate the left ventricular  endocardial borders. The left ventricular internal cavity size was normal in size. There is no left ventricular hypertrophy. Left ventricular diastolic parameters are consistent with Grade I diastolic dysfunction (impaired relaxation). Elevated left ventricular end-diastolic pressure. Right Ventricle: The right ventricular size is normal. No increase in right ventricular wall thickness. Right ventricular systolic function is normal. Left Atrium: Left atrial size was normal in size. Right Atrium: Right atrial size was normal in size. Pericardium: A small pericardial effusion is present. The pericardial effusion is posterior to the left ventricle. There is no evidence of cardiac tamponade. Mitral Valve: The mitral valve is grossly normal. Trivial mitral valve regurgitation. Tricuspid Valve: The tricuspid valve is grossly normal. Tricuspid valve regurgitation is trivial. Aortic Valve: The aortic valve was not well visualized. Aortic valve regurgitation is not visualized. Pulmonic Valve: The pulmonic valve was grossly normal. Pulmonic valve regurgitation is not visualized. Aorta: Aortic dilatation noted. There is borderline dilatation of the ascending aorta, measuring 38 mm. There is mild dilatation of the aortic root, measuring 41 mm. Venous: The inferior vena cava is normal in size with less than 50% respiratory variability, suggesting right atrial pressure of 8 mmHg. IAS/Shunts: No atrial level shunt detected by color flow  Doppler.  LEFT VENTRICLE PLAX 2D LVIDd:         5.10 cm      Diastology LVIDs:         3.90 cm      LV e' medial:    7.38 cm/s LV PW:         0.70 cm      LV E/e' medial:  18.6 LV IVS:        0.70 cm      LV e' lateral:   8.44 cm/s LVOT diam:     2.50 cm      LV E/e' lateral: 16.2 LV SV:         129 LV SV Index:   63 LVOT Area:     4.91 cm  LV Volumes (MOD) LV vol d, MOD A2C: 190.0 ml LV vol d, MOD A4C: 180.0 ml LV vol s, MOD A2C: 66.1 ml LV vol s, MOD A4C: 62.7 ml LV SV MOD A2C:     123.9 ml LV SV MOD A4C:     180.0 ml LV SV MOD BP:      121.8 ml RIGHT VENTRICLE RV S prime:     10.20 cm/s TAPSE (M-mode): 2.1 cm LEFT ATRIUM  Index       RIGHT ATRIUM           Index LA diam:        4.50 cm 2.19 cm/m  RA Area:     12.50 cm LA Vol (A2C):   52.2 ml 25.37 ml/m RA Volume:   25.70 ml  12.49 ml/m LA Vol (A4C):   48.1 ml 23.38 ml/m LA Biplane Vol: 51.3 ml 24.94 ml/m  AORTIC VALVE LVOT Vmax:   113.00 cm/s LVOT Vmean:  77.300 cm/s LVOT VTI:    0.263 m  AORTA Ao Root diam: 3.90 cm Ao Asc diam:  3.80 cm MITRAL VALVE MV Area (PHT): 5.13 cm     SHUNTS MV Decel Time: 148 msec     Systemic VTI:  0.26 m MV E velocity: 137.00 cm/s  Systemic Diam: 2.50 cm MV A velocity: 95.50 cm/s MV E/A ratio:  1.43 Lyman Bishop MD Electronically signed by Lyman Bishop MD Signature Date/Time: 03/21/2020/12:33:26 PM    Final    Scheduled Meds: . dextromethorphan-guaiFENesin  1 tablet Oral BID  . fenofibrate  160 mg Oral Daily  . folic acid  1 mg Oral Daily  . losartan  100 mg Oral Daily  . potassium chloride SA  20 mEq Oral Daily  . sodium chloride flush  3 mL Intravenous Q12H  . traZODone  50 mg Oral QHS   Continuous Infusions: . azithromycin 500 mg (03/22/20 1644)  . cefTRIAXone (ROCEPHIN)  IV 1 g (03/22/20 1412)     LOS: 0 days   Time spent: 51min  Keziah Avis C Liisa Picone, DO [facility]  If 7PM-7AM, please contact night-coverage [email or web address]  03/23/2020, 7:51 AM

## 2020-03-24 ENCOUNTER — Ambulatory Visit: Payer: BC Managed Care – PPO

## 2020-03-24 ENCOUNTER — Other Ambulatory Visit: Payer: Self-pay | Admitting: Internal Medicine

## 2020-03-24 ENCOUNTER — Ambulatory Visit
Admission: RE | Admit: 2020-03-24 | Discharge: 2020-03-24 | Disposition: A | Discharge status: Home / Self Care | Payer: BC Managed Care – PPO | Source: Ambulatory Visit | Attending: Radiation Oncology | Admitting: Radiation Oncology

## 2020-03-24 DIAGNOSIS — D649 Anemia, unspecified: Secondary | ICD-10-CM | POA: Diagnosis not present

## 2020-03-24 LAB — CBC
HCT: 24.1 % — ABNORMAL LOW (ref 39.0–52.0)
Hemoglobin: 8.3 g/dL — ABNORMAL LOW (ref 13.0–17.0)
MCH: 30.6 pg (ref 26.0–34.0)
MCHC: 34.4 g/dL (ref 30.0–36.0)
MCV: 88.9 fL (ref 80.0–100.0)
Platelets: 328 10*3/uL (ref 150–400)
RBC: 2.71 MIL/uL — ABNORMAL LOW (ref 4.22–5.81)
RDW: 13.2 % (ref 11.5–15.5)
WBC: 8.5 10*3/uL (ref 4.0–10.5)
nRBC: 0 % (ref 0.0–0.2)

## 2020-03-24 LAB — BASIC METABOLIC PANEL
Anion gap: 11 (ref 5–15)
BUN: 9 mg/dL (ref 6–20)
CO2: 24 mmol/L (ref 22–32)
Calcium: 8.2 mg/dL — ABNORMAL LOW (ref 8.9–10.3)
Chloride: 100 mmol/L (ref 98–111)
Creatinine, Ser: 0.58 mg/dL — ABNORMAL LOW (ref 0.61–1.24)
GFR, Estimated: >60 mL/min (ref >60)
Glucose, Bld: 97 mg/dL (ref 70–99)
Potassium: 3.6 mmol/L (ref 3.5–5.1)
Sodium: 135 mmol/L (ref 135–145)

## 2020-03-24 MED ORDER — DM-GUAIFENESIN ER 30-600 MG PO TB12
1.0000 | ORAL_TABLET | Freq: Two times a day (BID) | ORAL | 0 refills | Status: DC
Start: 2020-03-24 — End: 2020-04-30

## 2020-03-24 NOTE — Discharge Summary (Signed)
Physician Discharge Summary  HERCHEL HOPKIN ELF:[id number] DOB: Aug 18, 1960 DOA: 03/20/2020  PCP: [name], DO  Admit date: 03/20/2020 Discharge date: 03/24/2020  Admitted From: Home Disposition: Home  Recommendations for Outpatient Follow-up:  1. Follow up with PCP in 1-2 weeks 2. Please obtain BMP/CBC in one week 3. Please follow up on the following pending results:  Home Health: None Equipment/Devices: None  Discharge Condition: Stable CODE STATUS: Full Diet recommendation: As tolerated  Brief/Interim Summary: This is a 59 year old male with past medical history of NSCLC, asthma, hypertension, recent admission for septic shock secondary to multifocal pneumonia from 11/19-11/23 who presented to the ED with persistent shortness of breath, cough and generalized weakness. At his recent hospitalization he completed 5 days of antibiotic therapy with vancomycin and cefepime and was discharged without any antibiotics. States that since he has been home he has not felt any better and has had fevers intermittently up to 102 F. Also has felt short of breath on exertion and weak. He was offered a blood transfusion at his last hospitalization but declined due to concerns of any adverse reactions and was started on folic acid due to deficiency on his last visit. Has been taking his folic acid. Also admits to diffuse swelling since his hospitalization and weight gain but denies any known history of heart failure.  Patient admitted as above with acute symptomatic which presented as dyspnea with exertion quite fatigued.  Patient's imaging at intake was concerning for pneumonia, given discussion with family and patient about trouble swallowing he was evaluated by speech with concern for questionable aspiration pneumonitis versus pneumonia, he continues to improve on supportive care and antibiotics but did not require oxygen and did not meet sepsis criteria.  Patient also appeared to be somewhat  volume overloaded at admission requiring Lasix x1, echo shows small pericardial effusion without tamponade and grade 1 diastolic dysfunction.  At this time patient appears euvolemic, tolerating p.o. quite well with resolving dyspnea with exertion again without hypoxia.  We discussed this could be acute symptomatic anemia on top of his known non-small cell lung cancer, he discusses feeling markedly after radiation therapy as well which could be an exacerbating factor.  At this time will discharge patient on remainder of antibiotics with close follow-up with PCP oncology and radiation as scheduled.  Continue to recommend close follow-up with PCP for repeat labs to ensure stable/improving anemia while on supplements and improve diet.  Discharge Diagnoses:  Principal Problem:   Symptomatic anemia Active Problems:   Essential hypertension   CAP (community acquired pneumonia)   Volume overload    Discharge Instructions  Discharge Instructions    Call MD for:  difficulty breathing, headache or visual disturbances   Complete by: As directed    Diet - low sodium heart healthy   Complete by: As directed    Increase activity slowly   Complete by: As directed      Allergies as of 03/24/2020   No Known Allergies     Medication List    STOP taking these medications   HYDROcodone-homatropine 5-1.5 MG/5ML syrup Commonly known as: HYCODAN   prochlorperazine 10 MG tablet Commonly known as: COMPAZINE     TAKE these medications   acetaminophen 500 MG tablet Commonly known as: TYLENOL Take 1,000 mg by mouth every 6 (six) hours as needed for mild pain.   Combivent Respimat 20-100 MCG/ACT Aers respimat Generic drug: Ipratropium-Albuterol Inhale 1-2 puffs into the lungs every 6 (six) hours as needed for wheezing.  Delsym 30 MG/5ML liquid Generic drug: dextromethorphan Take 30 mg by mouth at bedtime as needed for cough.   dextromethorphan-guaiFENesin 30-600 MG 12hr tablet Commonly known  as: MUCINEX DM Take 1 tablet by mouth 2 (two) times daily.   fenofibrate 160 MG tablet Take 1 tablet (160 mg total) by mouth daily.   folic acid 1 MG tablet Commonly known as: FOLVITE Take 1 tablet (1 mg total) by mouth daily.   guaiFENesin 600 MG 12 hr tablet Commonly known as: MUCINEX Take 600 mg by mouth daily as needed for cough.   LORazepam 1 MG tablet Commonly known as: ATIVAN TAKE 1 TABLET BY MOUTH THREE TIMES DAILY AS NEEDED FOR ANXIETY What changed:   how much to take  how to take this  when to take this  reasons to take this  additional instructions   losartan 100 MG tablet Commonly known as: COZAAR Take 1 tablet (100 mg total) by mouth daily.   multivitamin with minerals Tabs tablet Take 1 tablet by mouth daily.   potassium chloride SA 20 MEQ tablet Commonly known as: KLOR-CON Take 1 tablet (20 mEq total) by mouth daily.   traZODone 50 MG tablet Commonly known as: DESYREL TAKE 1 TO 2 TABLETS BY MOUTH AT BEDTIME AS NEEDED FOR SLEEP What changed:   how much to take  when to take this  additional instructions       No Known Allergies  Consultations: None  Procedures/Studies: DG Chest 1 View  Result Date: 03/05/2020 CLINICAL DATA:  History of lung cancer. Shortness of breath, fever and weakness. EXAM: CHEST  1 VIEW COMPARISON:  01/07/2020 FINDINGS: Heart size is normal. Left lung is clear. Pulmonary infiltrate affecting the right lower lobe and upper lobe consistent with bronchopneumonia. Cannot rule out an element of radiation affect or interstitial spread of tumor, but due to the extensive nature of the findings, infectious pneumonia seems likely. Small amount of pleural fluid on the right. IMPRESSION: Extensive infiltrate in the right upper and lower lobes consistent with infectious pneumonia. Small amount of pleural fluid on the right. Electronically Signed   By: Nelson Chimes M.D.   On: 03/05/2020 11:44   DG Chest 2 View  Result Date:  03/20/2020 CLINICAL DATA:  Shortness of breath. Recent sepsis and hospitalization. EXAM: CHEST - 2 VIEW COMPARISON:  03/13/2020 FINDINGS: Extensive airspace disease throughout the right lung, similar to prior study. Small right pleural effusion. Patchy nodular opacities are seen in the left lung as seen on prior CT. Heart is normal size. No acute bony abnormality. IMPRESSION: Extensive airspace disease throughout the right lung concerning for pneumonia. Small right pleural effusion. Findings similar to prior study. Patchy vague ground-glass nodular opacities throughout the left lung as seen on prior CT. No real change since prior study. Electronically Signed   By: Rolm Baptise M.D.   On: 03/20/2020 10:42   CT Angio Chest PE W/Cm &/Or Wo Cm  Result Date: 03/13/2020 CLINICAL DATA:  Fever. Hypotension. Anemia. Lung cancer. Ongoing radiation therapy and chemotherapy. EXAM: CT ANGIOGRAPHY CHEST WITH CONTRAST TECHNIQUE: Multidetector CT imaging of the chest was performed using the standard protocol during bolus administration of intravenous contrast. Multiplanar CT image reconstructions and MIPs were obtained to evaluate the vascular anatomy. CONTRAST:  43mL OMNIPAQUE IOHEXOL 350 MG/ML SOLN COMPARISON:  Chest radiograph from earlier today. 01/16/2020 PET-CT. 12/20/2019 chest CT. FINDINGS: Cardiovascular: The study is moderate quality for the evaluation of pulmonary embolism, with some motion degradation. There are no convincing filling  defects in the central, lobar, segmental or subsegmental pulmonary artery branches to suggest acute pulmonary embolism. Atherosclerotic nonaneurysmal thoracic aorta. Stable top-normal caliber main pulmonary artery (3.0 cm diameter). Normal heart size. Stable trace pericardial effusion/thickening. Mediastinum/Nodes: No discrete thyroid nodules. Unremarkable esophagus. No axillary adenopathy. Mildly enlarged 1.2 cm subcarinal node (series 4/image 75), new. Mildly enlarged 1.0 cm right  hilar node (series 4/image 71) new. No left hilar adenopathy. Lungs/Pleura: No pneumothorax. Small dependent bilateral pleural effusions, new bilaterally. Moderate centrilobular and paraseptal emphysema with diffuse bronchial wall thickening and saber sheath trachea. Extensive patchy consolidation and ground-glass opacity throughout the right lung involving all right lung lobes with associated air bronchograms, new since 01/16/2020 PET-CT, obscuring the margins of the cavitary right upper perihilar lung mass, which measures approximately 4.2 x 3.0 cm (series 10/image 58), not substantially changed since 01/16/2020 PET-CT. New patchy peribronchovascular irregular foci of consolidation and ground-glass opacity throughout left upper lobe. Stable solid 0.6 cm posterior left upper lobe pulmonary nodule (series 10/image 88). Upper abdomen: No acute abnormality. Musculoskeletal:  No aggressive appearing focal osseous lesions. Review of the MIP images confirms the above findings. IMPRESSION: 1. No evidence of acute pulmonary embolism. 2. Extensive patchy consolidation and ground-glass opacity throughout the right greater than left lungs, new since 01/16/2020 PET-CT. Findings favor multilobar pneumonia, with differential including drug reaction. A portion of the right lung opacities may represent early post treatment change. Close chest CT follow-up recommended. 3. Cavitary perihilar right upper lung mass is obscured and is grossly stable. Stable subcentimeter solid left lower lobe pulmonary nodule. 4. New small dependent bilateral pleural effusions. 5. New mild subcarinal and right hilar lymphadenopathy, nonspecific, suggest attention on follow-up chest CT with IV contrast in 3 months. 6. Aortic Atherosclerosis (ICD10-I70.0) and Emphysema (ICD10-J43.9). Electronically Signed   By: Ilona Sorrel M.D.   On: 03/13/2020 14:26   DG Chest Port 1 View  Result Date: 03/13/2020 CLINICAL DATA:  Hypotension and fever.  Possible  sepsis. EXAM: PORTABLE CHEST 1 VIEW COMPARISON:  Radiographs 03/05/2020 and 01/07/2020. CT 12/20/2019 and PET-CT 01/16/2020. FINDINGS: 1111 hours. The heart size and mediastinal contours are stable without gross adenopathy. There is progressive airspace disease throughout the right lung with mild volume loss. This obscures the previously demonstrated cavitary lesion in the right upper lobe. The left lung is clear. There is no pleural effusion or pneumothorax. No acute osseous findings are seen. IMPRESSION: Progressive right lung airspace disease suspicious for pneumonia in this clinical setting. In this patient with recently diagnosed lung cancer, these findings could relate to radiation therapy. Correlate clinically. Electronically Signed   By: Richardean Sale M.D.   On: 03/13/2020 11:43   DG Swallowing Func-Speech Pathology  Result Date: 03/23/2020 Objective Swallowing Evaluation: Type of Study: MBS-Modified Barium Swallow Study  Patient Details Name: Richard Li MRN: [id number] Date of Birth: 1960-12-28 Today's Date: 03/23/2020 Time: SLP Start Time (ACUTE ONLY): 3299 -SLP Stop Time (ACUTE ONLY): 1320 SLP Time Calculation (min) (ACUTE ONLY): 25 min Past Medical History: Past Medical History: Diagnosis Date . Allergic rhinitis, cause unspecified  . Anxiety state, unspecified  . Asthma   as a child . Chronic airway obstruction, not elsewhere classified  . Esophageal reflux  . History of kidney stones  . Hypertension  . Lumbago  . Other and unspecified hyperlipidemia  . Other chest pain  Past Surgical History: Past Surgical History: Procedure Laterality Date . APPENDECTOMY   . BRONCHIAL BIOPSY  01/07/2020  Procedure: BRONCHIAL BIOPSIES;  Surgeon: Baltazar Apo  S, MD;  Location: [place];  Service: Pulmonary;; . BRONCHIAL BRUSHINGS  01/07/2020  Procedure: BRONCHIAL BRUSHINGS;  Surgeon: Collene Gobble, MD;  Location: [facility] ENDOSCOPY;  Service: Pulmonary;; . BRONCHIAL NEEDLE ASPIRATION BIOPSY  01/07/2020   Procedure: BRONCHIAL NEEDLE ASPIRATION BIOPSIES;  Surgeon: Collene Gobble, MD;  Location[facility] ENDOSCOPY;  Service: Pulmonary;; . BRONCHIAL WASHINGS  01/07/2020  Procedure: BRONCHIAL WASHINGS;  Surgeon: Collene Gobble, MD;  Location: [facility] ENDOSCOPY;  Service: Pulmonary;; . COLONOSCOPY  15 years ago . HEMOSTASIS CONTROL  01/07/2020  Procedure: HEMOSTASIS CONTROL;  Surgeon: Collene Gobble, MD;  Location: [facility] ENDOSCOPY;  Service: Pulmonary;;  cold saline . NASAL TURBINATE REDUCTION  2002  Dr.Crossley . VASECTOMY   . VIDEO BRONCHOSCOPY WITH ENDOBRONCHIAL NAVIGATION N/A 01/07/2020  Procedure: VIDEO BRONCHOSCOPY WITH ENDOBRONCHIAL NAVIGATION;  Surgeon: Collene Gobble, MD;  Location: [place] ENDOSCOPY;  Service: Pulmonary;  Laterality: N/A; . VIDEO BRONCHOSCOPY WITH ENDOBRONCHIAL ULTRASOUND N/A 01/07/2020  Procedure: VIDEO BRONCHOSCOPY WITH ENDOBRONCHIAL ULTRASOUND;  Surgeon: Collene Gobble, MD;  Location: [place] ENDOSCOPY;  Service: Pulmonary;  Laterality: N/A; HPI: This is a 59 year old male with past medical history of NSCLC, asthma, hypertension, recent admission for septic shock secondary to multifocal pneumonia from 11/19-11/23 who presented to the ED with persistent shortness of breath, cough and generalized weakness. CXR 03/20/20: Extensive airspace disease throughout the right lung concerning for pneumonia versus changes from radiation treatment. Small right pleural effusion. Findings similar to prior study.  Subjective: pleasant, alert, just had radiation treatment at 11:45am Assessment / Plan / Recommendation CHL IP CLINICAL IMPRESSIONS 03/23/2020 Clinical Impression Patient presents with a mild oral and a mild-moderate pharyngeal dysphagia which appears to be sensorimotor based. Patient has been with dry mouth and sore throat from coughing which he stated has resulted in him not eating that much lately. During oral phase, patient exhbiited decreased anterior to posterior transit of puree and regular texture boluses as well  as 1/2 barium tablet (patient requested to have it split in half). During pharyngeal phase, patient had decreased hyolaryngeal elevation with decreased pharyngeal contraction leading to moderate vallecular sinus residuals with puree solids and regular solids, mild pyriform sinus residuals with puree solids, regular solids and thin liquds. Majority of vallecular residuals cleared with sips of thin liquids and extra/dry swallows. Patient did exhibit one instance of flash penetration with thin liquids of trace amount, as well as silent penetration of trace amount of thin liquids that led to deep penetration that was also not sensed. No aspiration was observed. Pyriform sinus residuals were also impacted by patient's decreased UES opening which was observed with all consistencies. SLP Visit Diagnosis Dysphagia, oropharyngeal phase (R13.12) Attention and concentration deficit following -- Frontal lobe and executive function deficit following -- Impact on safety and function Mild aspiration risk;Risk for inadequate nutrition/hydration   CHL IP TREATMENT RECOMMENDATION 03/23/2020 Treatment Recommendations Therapy as outlined in treatment plan below   Prognosis 03/23/2020 Prognosis for Safe Diet Advancement Good Barriers to Reach Goals -- Barriers/Prognosis Comment -- CHL IP DIET RECOMMENDATION 03/23/2020 SLP Diet Recommendations Regular solids;Thin liquid Liquid Administration via Cup;Straw Medication Administration Whole meds with liquid Compensations Small sips/bites;Slow rate;Follow solids with liquid Postural Changes Seated upright at 90 degrees   CHL IP OTHER RECOMMENDATIONS 03/23/2020 Recommended Consults -- Oral Care Recommendations Oral care BID;Patient independent with oral care Other Recommendations --   CHL IP FOLLOW UP RECOMMENDATIONS 03/23/2020 Follow up Recommendations Other (comment)   CHL IP FREQUENCY AND DURATION 03/23/2020 Speech Therapy Frequency (ACUTE ONLY) min 2x/week  Treatment Duration 1 week       CHL IP ORAL PHASE 03/23/2020 Oral Phase Impaired Oral - Pudding Teaspoon -- Oral - Pudding Cup -- Oral - Honey Teaspoon -- Oral - Honey Cup -- Oral - Nectar Teaspoon -- Oral - Nectar Cup -- Oral - Nectar Straw -- Oral - Thin Teaspoon -- Oral - Thin Cup -- Oral - Thin Straw -- Oral - Puree Reduced posterior propulsion;Delayed oral transit;Weak lingual manipulation Oral - Mech Soft -- Oral - Regular Weak lingual manipulation;Delayed oral transit;Reduced posterior propulsion Oral - Multi-Consistency -- Oral - Pill Reduced posterior propulsion;Delayed oral transit;Weak lingual manipulation;Decreased bolus cohesion Oral Phase - Comment --  CHL IP PHARYNGEAL PHASE 03/23/2020 Pharyngeal Phase Impaired Pharyngeal- Pudding Teaspoon -- Pharyngeal -- Pharyngeal- Pudding Cup -- Pharyngeal -- Pharyngeal- Honey Teaspoon -- Pharyngeal -- Pharyngeal- Honey Cup -- Pharyngeal -- Pharyngeal- Nectar Teaspoon -- Pharyngeal -- Pharyngeal- Nectar Cup -- Pharyngeal -- Pharyngeal- Nectar Straw -- Pharyngeal -- Pharyngeal- Thin Teaspoon -- Pharyngeal -- Pharyngeal- Thin Cup Penetration/Aspiration during swallow;Penetration/Apiration after swallow;Pharyngeal residue - pyriform;Compensatory strategies attempted (with notebox) Pharyngeal Material enters airway, remains ABOVE vocal cords then ejected out;Material enters airway, remains ABOVE vocal cords and not ejected out Pharyngeal- Thin Straw Penetration/Aspiration during swallow;Penetration/Apiration after swallow;Pharyngeal residue - pyriform;Reduced anterior laryngeal mobility Pharyngeal Material enters airway, remains ABOVE vocal cords then ejected out;Material enters airway, remains ABOVE vocal cords and not ejected out Pharyngeal- Puree Reduced pharyngeal peristalsis;Reduced tongue base retraction;Pharyngeal residue - valleculae;Pharyngeal residue - pyriform;Reduced anterior laryngeal mobility Pharyngeal -- Pharyngeal- Mechanical Soft -- Pharyngeal -- Pharyngeal- Regular Reduced tongue  base retraction;Pharyngeal residue - valleculae;Pharyngeal residue - pyriform;Reduced pharyngeal peristalsis;Reduced anterior laryngeal mobility Pharyngeal -- Pharyngeal- Multi-consistency -- Pharyngeal -- Pharyngeal- Pill Delayed swallow initiation-vallecula;Reduced anterior laryngeal mobility Pharyngeal -- Pharyngeal Comment --  CHL IP CERVICAL ESOPHAGEAL PHASE 03/23/2020 Cervical Esophageal Phase Impaired Pudding Teaspoon -- Pudding Cup -- Honey Teaspoon -- Honey Cup -- Nectar Teaspoon -- Nectar Cup -- Nectar Straw -- Thin Teaspoon -- Thin Cup Reduced cricopharyngeal relaxation Thin Straw Reduced cricopharyngeal relaxation Puree Reduced cricopharyngeal relaxation Mechanical Soft -- Regular Reduced cricopharyngeal relaxation Multi-consistency -- Pill Reduced cricopharyngeal relaxation Cervical Esophageal Comment -- Sonia Baller, MA, CCC-SLP Speech Therapy             ECHOCARDIOGRAM COMPLETE  Result Date: 03/21/2020    ECHOCARDIOGRAM REPORT   Patient Name:   OTILIO GROLEAU Date of Exam: 03/21/2020 Medical Rec #:  [id number]       Height:       70.5 in Accession #:    [id number]      Weight:       190.9 lb Date of Birth:  [birth year]       BSA:          2.057 m Patient Age:    76 years        BP:           113/72 mmHg Patient Gender: M               HR:           89 bpm. Exam Location:  Inpatient Procedure: 2D Echo, Cardiac Doppler, Color Doppler and Intracardiac            Opacification Agent Indications:    Dyspnea 786.09 / R06.00  History:        Patient has no prior history of Echocardiogram examinations.  Risk Factors:Hypertension, Dyslipidemia and Former Smoker.  Sonographer:    Vickie Epley RDCS Referring Phys: [id number] Harold Hedge  Sonographer Comments: Suboptimal parasternal window. IMPRESSIONS  1. Left ventricular ejection fraction, by estimation, is 60 to 65%. The left ventricle has normal function. The left ventricle has no regional wall motion abnormalities. Left ventricular  diastolic parameters are consistent with Grade I diastolic dysfunction (impaired relaxation). Elevated left ventricular end-diastolic pressure.  2. Right ventricular systolic function is normal. The right ventricular size is normal.  3. A small pericardial effusion is present. The pericardial effusion is posterior to the left ventricle. There is no evidence of cardiac tamponade.  4. The mitral valve is grossly normal. Trivial mitral valve regurgitation.  5. The aortic valve was not well visualized. Aortic valve regurgitation is not visualized.  6. Aortic dilatation noted. There is borderline dilatation of the ascending aorta, measuring 38 mm. There is mild dilatation of the aortic root, measuring 41 mm.  7. The inferior vena cava is normal in size with <50% respiratory variability, suggesting right atrial pressure of 8 mmHg. FINDINGS  Left Ventricle: Left ventricular ejection fraction, by estimation, is 60 to 65%. The left ventricle has normal function. The left ventricle has no regional wall motion abnormalities. Definity contrast agent was given IV to delineate the left ventricular  endocardial borders. The left ventricular internal cavity size was normal in size. There is no left ventricular hypertrophy. Left ventricular diastolic parameters are consistent with Grade I diastolic dysfunction (impaired relaxation). Elevated left ventricular end-diastolic pressure. Right Ventricle: The right ventricular size is normal. No increase in right ventricular wall thickness. Right ventricular systolic function is normal. Left Atrium: Left atrial size was normal in size. Right Atrium: Right atrial size was normal in size. Pericardium: A small pericardial effusion is present. The pericardial effusion is posterior to the left ventricle. There is no evidence of cardiac tamponade. Mitral Valve: The mitral valve is grossly normal. Trivial mitral valve regurgitation. Tricuspid Valve: The tricuspid valve is grossly normal.  Tricuspid valve regurgitation is trivial. Aortic Valve: The aortic valve was not well visualized. Aortic valve regurgitation is not visualized. Pulmonic Valve: The pulmonic valve was grossly normal. Pulmonic valve regurgitation is not visualized. Aorta: Aortic dilatation noted. There is borderline dilatation of the ascending aorta, measuring 38 mm. There is mild dilatation of the aortic root, measuring 41 mm. Venous: The inferior vena cava is normal in size with less than 50% respiratory variability, suggesting right atrial pressure of 8 mmHg. IAS/Shunts: No atrial level shunt detected by color flow Doppler.  LEFT VENTRICLE PLAX 2D LVIDd:         5.10 cm      Diastology LVIDs:         3.90 cm      LV e' medial:    7.38 cm/s LV PW:         0.70 cm      LV E/e' medial:  18.6 LV IVS:        0.70 cm      LV e' lateral:   8.44 cm/s LVOT diam:     2.50 cm      LV E/e' lateral: 16.2 LV SV:         129 LV SV Index:   63 LVOT Area:     4.91 cm  LV Volumes (MOD) LV vol d, MOD A2C: 190.0 ml LV vol d, MOD A4C: 180.0 ml LV vol s, MOD A2C: 66.1 ml LV vol s, MOD  A4C: 62.7 ml LV SV MOD A2C:     123.9 ml LV SV MOD A4C:     180.0 ml LV SV MOD BP:      121.8 ml RIGHT VENTRICLE RV S prime:     10.20 cm/s TAPSE (M-mode): 2.1 cm LEFT ATRIUM             Index       RIGHT ATRIUM           Index LA diam:        4.50 cm 2.19 cm/m  RA Area:     12.50 cm LA Vol (A2C):   52.2 ml 25.37 ml/m RA Volume:   25.70 ml  12.49 ml/m LA Vol (A4C):   48.1 ml 23.38 ml/m LA Biplane Vol: 51.3 ml 24.94 ml/m  AORTIC VALVE LVOT Vmax:   113.00 cm/s LVOT Vmean:  77.300 cm/s LVOT VTI:    0.263 m  AORTA Ao Root diam: 3.90 cm Ao Asc diam:  3.80 cm MITRAL VALVE MV Area (PHT): 5.13 cm     SHUNTS MV Decel Time: 148 msec     Systemic VTI:  0.26 m MV E velocity: 137.00 cm/s  Systemic Diam: 2.50 cm MV A velocity: 95.50 cm/s MV E/A ratio:  1.43 Lyman Bishop MD Electronically signed by Lyman Bishop MD Signature Date/Time: 03/21/2020/12:33:26 PM    Final       Subjective: No acute issues or events overnight denies nausea, vomiting, diarrhea, constipation, headache, chills.   Discharge Exam: Vitals:   03/23/20 2001 03/24/20 0550  BP: 137/60 139/61  Pulse: 90 90  Resp: 14 16  Temp: 98.4 F (36.9 C) 98.2 F (36.8 C)  SpO2: 93% (!) 89%   Vitals:   03/23/20 0548 03/23/20 1459 03/23/20 2001 03/24/20 0550  BP: 137/69 134/63 137/60 139/61  Pulse: 85 92 90 90  Resp: 18 (!) 21 14 16   Temp: 98.2 F (36.8 C) 99.5 F (37.5 C) 98.4 F (36.9 C) 98.2 F (36.8 C)  TempSrc: Oral Oral Oral Oral  SpO2: 92% 93% 93% (!) 89%  Weight: 89.9 kg     Height:        General: Pt is alert, awake, not in acute distress Cardiovascular: RRR, S1/S2 +, no rubs, no gallops Respiratory: CTA bilaterally, no wheezing, no rhonchi Abdominal: Soft, NT, ND, bowel sounds + Extremities: no edema, no cyanosis    The results of significant diagnostics from this hospitalization (including imaging, microbiology, ancillary and laboratory) are listed below for reference.     Microbiology: Recent Results (from the past 240 hour(s))  MRSA PCR Screening     Status: None   Collection Time: 03/15/20  9:59 AM   Specimen: Nasopharyngeal  Result Value Ref Range Status   MRSA by PCR NEGATIVE NEGATIVE Final    Comment:        The GeneXpert MRSA Assay (FDA approved for NASAL specimens only), is one component of a comprehensive MRSA colonization surveillance program. It is not intended to diagnose MRSA infection nor to guide or monitor treatment for MRSA infections. Performed at [facility], [place] [address], [place], [place] [address]   Blood culture (routine x 2)     Status: None (Preliminary result)   Collection Time: 03/20/20  2:30 PM   Specimen: BLOOD  Result Value Ref Range Status   Specimen Description   Final    BLOOD RIGHT ANTECUBITAL Performed at [facility] [address] [place], [place] [address]  Special  Requests   Final    BOTTLES DRAWN AEROBIC AND ANAEROBIC Blood Culture adequate volume Performed at [facility] [address], [place], [place] [address]    Culture   Final    NO GROWTH 4 DAYS Performed at [facility], [place] [address] [place], [place] [address]    Report Status PENDING  Incomplete  Resp Panel by RT-PCR (Flu A&B, Covid) Urine, Clean Catch     Status: None   Collection Time: 03/20/20  2:30 PM   Specimen: Urine, Clean Catch; Nasopharyngeal(NP) swabs in vial transport medium  Result Value Ref Range Status   SARS Coronavirus 2 by RT PCR NEGATIVE NEGATIVE Final    Comment: (NOTE) SARS-CoV-2 target nucleic acids are NOT DETECTED.  The SARS-CoV-2 RNA is generally detectable in upper respiratory specimens during the acute phase of infection. The lowest concentration of SARS-CoV-2 viral copies this assay can detect is 138 copies/mL. A negative result does not preclude SARS-Cov-2 infection and should not be used as the sole basis for treatment or other patient management decisions. A negative result may occur with  improper specimen collection/handling, submission of specimen other than nasopharyngeal swab, presence of viral mutation(s) within the areas targeted by this assay, and inadequate number of viral copies(<138 copies/mL). A negative result must be combined with clinical observations, patient history, and epidemiological information. The expected result is Negative.  Fact Sheet for Patients:  EntrepreneurPulse.com.au  Fact Sheet for Healthcare Providers:  IncredibleEmployment.be  This test is no t yet approved or cleared by the [place] FDA and  has been authorized for detection and/or diagnosis of SARS-CoV-2 by FDA under an Emergency Use Authorization (EUA). This EUA will remain  in effect (meaning this test can be used) for the duration of the COVID-19 declaration under Section 564(b)(1) of the  Act, 21 U.S.C.section 360bbb-3(b)(1), unless the authorization is terminated  or revoked sooner.       Influenza A by PCR NEGATIVE NEGATIVE Final   Influenza B by PCR NEGATIVE NEGATIVE Final    Comment: (NOTE) The Xpert Xpress SARS-CoV-2/FLU/RSV plus assay is intended as an aid in the diagnosis of influenza from Nasopharyngeal swab specimens and should not be used as a sole basis for treatment. Nasal washings and aspirates are unacceptable for Xpert Xpress SARS-CoV-2/FLU/RSV testing.  Fact Sheet for Patients: EntrepreneurPulse.com.au  Fact Sheet for Healthcare Providers: IncredibleEmployment.be  This test is not yet approved or cleared by the [place] FDA and has been authorized for detection and/or diagnosis of SARS-CoV-2 by FDA under an Emergency Use Authorization (EUA). This EUA will remain in effect (meaning this test can be used) for the duration of the COVID-19 declaration under Section 564(b)(1) of the Act, 21 U.S.C. section 360bbb-3(b)(1), unless the authorization is terminated or revoked.  Performed at [facility], [place] [address], [place], [place] [address]   Blood culture (routine x 2)     Status: None (Preliminary result)   Collection Time: 03/20/20  2:44 PM   Specimen: BLOOD  Result Value Ref Range Status   Specimen Description   Final    BLOOD LEFT ANTECUBITAL Performed at [facility] [place] [address] [place], [place] [address]    Special Requests   Final    BOTTLES DRAWN AEROBIC AND ANAEROBIC Blood Culture adequate volume Performed at [facility] [address], [place], [place] [address]    Culture   Final    NO GROWTH 4 DAYS Performed  at [facility], [place] [address], [place], [place] [address]    Report Status PENDING  Incomplete     Labs: BNP (last 3 results) No results for input(s): BNP in the last 8760 hours. Basic Metabolic Panel: Recent  Labs  Lab 03/20/20 1050 03/21/20 0626 03/22/20 0726 03/23/20 0705 03/24/20 0649  NA 135 136 139 136 135  K 3.7 3.9 4.3 3.6 3.6  CL 100 100 102 101 100  CO2 26 27 29 25 24   GLUCOSE 97 133* 93 91 97  BUN 8 11 13 8 9   CREATININE 0.70 0.50* 0.78 0.62 0.58*  CALCIUM 8.2* 8.2* 8.3* 8.3* 8.2*   Liver Function Tests: No results for input(s): AST, ALT, ALKPHOS, BILITOT, PROT, ALBUMIN in the last 168 hours. No results for input(s): LIPASE, AMYLASE in the last 168 hours. No results for input(s): AMMONIA in the last 168 hours. CBC: Recent Labs  Lab 03/20/20 1050 03/20/20 1050 03/21/20 0626 03/22/20 0726 03/22/20 1234 03/23/20 0705 03/24/20 0649  WBC 6.5  --  5.9 5.4  --  6.6 8.5  NEUTROABS 5.1  --   --   --   --   --   --   HGB 7.3*   < > 8.7* 7.8* 8.9* 7.8* 8.3*  HCT 22.0*   < > 25.9* 23.3* 27.5* 23.7* 24.1*  MCV 91.3  --  89.9 89.6  --  90.1 88.9  PLT 264  --  314 298  --  306 328   < > = values in this interval not displayed.   Cardiac Enzymes: No results for input(s): CKTOTAL, CKMB, CKMBINDEX, TROPONINI in the last 168 hours. BNP: Invalid input(s): POCBNP CBG: No results for input(s): GLUCAP in the last 168 hours. D-Dimer No results for input(s): DDIMER in the last 72 hours. Hgb A1c No results for input(s): HGBA1C in the last 72 hours. Lipid Profile No results for input(s): CHOL, HDL, LDLCALC, TRIG, CHOLHDL, LDLDIRECT in the last 72 hours. Thyroid function studies No results for input(s): TSH, T4TOTAL, T3FREE, THYROIDAB in the last 72 hours.  Invalid input(s): FREET3 Anemia work up No results for input(s): VITAMINB12, FOLATE, FERRITIN, TIBC, IRON, RETICCTPCT in the last 72 hours. Urinalysis    Component Value Date/Time   COLORURINE YELLOW 03/20/2020 1435   APPEARANCEUR CLEAR 03/20/2020 1435   LABSPEC 1.005 03/20/2020 1435   PHURINE 8.0 03/20/2020 1435   GLUCOSEU NEGATIVE 03/20/2020 1435   GLUCOSEU NEGATIVE 12/12/2011 0944   HGBUR NEGATIVE 03/20/2020 1435    BILIRUBINUR NEGATIVE 03/20/2020 1435   KETONESUR NEGATIVE 03/20/2020 1435   PROTEINUR NEGATIVE 03/20/2020 1435   UROBILINOGEN 0.2 12/12/2011 0944   NITRITE NEGATIVE 03/20/2020 1435   LEUKOCYTESUR NEGATIVE 03/20/2020 1435   Sepsis Labs Invalid input(s): PROCALCITONIN,  WBC,  LACTICIDVEN Microbiology Recent Results (from the past 240 hour(s))  MRSA PCR Screening     Status: None   Collection Time: 03/15/20  9:59 AM   Specimen: Nasopharyngeal  Result Value Ref Range Status   MRSA by PCR NEGATIVE NEGATIVE Final    Comment:        The GeneXpert MRSA Assay (FDA approved for NASAL specimens only), is one component of a comprehensive MRSA colonization surveillance program. It is not intended to diagnose MRSA infection nor to guide or monitor treatment for MRSA infections. Performed at [facility], [place] [address], [place],  [address]   Blood culture (routine x 2)     Status: None (Preliminary result)   Collection Time: 03/20/20  2:30 PM   Specimen: BLOOD  Result Value Ref Range Status   Specimen Description   Final    BLOOD RIGHT ANTECUBITAL Performed at [facility] [address], [place], [place] [address]    Special Requests   Final    BOTTLES DRAWN AEROBIC AND ANAEROBIC Blood Culture adequate volume Performed at [facility] [address], [place], [place] [address]    Culture   Final    NO GROWTH 4 DAYS Performed at [facility], [place] [address] [place], [place] [address]    Report Status PENDING  Incomplete  Resp Panel by RT-PCR (Flu A&B, Covid) Urine, Clean Catch     Status: None   Collection Time: 03/20/20  2:30 PM   Specimen: Urine, Clean Catch; Nasopharyngeal(NP) swabs in vial transport medium  Result Value Ref Range Status   SARS Coronavirus 2 by RT PCR NEGATIVE NEGATIVE Final    Comment: (NOTE) SARS-CoV-2 target nucleic acids are NOT DETECTED.  The SARS-CoV-2 RNA is generally detectable  in upper respiratory specimens during the acute phase of infection. The lowest concentration of SARS-CoV-2 viral copies this assay can detect is 138 copies/mL. A negative result does not preclude SARS-Cov-2 infection and should not be used as the sole basis for treatment or other patient management decisions. A negative result may occur with  improper specimen collection/handling, submission of specimen other than nasopharyngeal swab, presence of viral mutation(s) within the areas targeted by this assay, and inadequate number of viral copies(<138 copies/mL). A negative result must be combined with clinical observations, patient history, and epidemiological information. The expected result is Negative.  Fact Sheet for Patients:  EntrepreneurPulse.com.au  Fact Sheet for Healthcare Providers:  IncredibleEmployment.be  This test is no t yet approved or cleared by the [place] FDA and  has been authorized for detection and/or diagnosis of SARS-CoV-2 by FDA under an Emergency Use Authorization (EUA). This EUA will remain  in effect (meaning this test can be used) for the duration of the COVID-19 declaration under Section 564(b)(1) of the Act, 21 U.S.C.section 360bbb-3(b)(1), unless the authorization is terminated  or revoked sooner.       Influenza A by PCR NEGATIVE NEGATIVE Final   Influenza B by PCR NEGATIVE NEGATIVE Final    Comment: (NOTE) The Xpert Xpress SARS-CoV-2/FLU/RSV plus assay is intended as an aid in the diagnosis of influenza from Nasopharyngeal swab specimens and should not be used as a sole basis for treatment. Nasal washings and aspirates are unacceptable for Xpert Xpress SARS-CoV-2/FLU/RSV testing.  Fact Sheet for Patients: EntrepreneurPulse.com.au  Fact Sheet for Healthcare Providers: IncredibleEmployment.be  This test is not yet approved or cleared by the [place] FDA and has  been authorized for detection and/or diagnosis of SARS-CoV-2 by FDA under an Emergency Use Authorization (EUA). This EUA will remain in effect (meaning this test can be used) for the duration of the COVID-19 declaration under Section 564(b)(1) of the Act, 21 U.S.C. section 360bbb-3(b)(1), unless the authorization is terminated or revoked.  Performed at [facility], [place] [address], [place], [place] [address]   Blood culture (routine x 2)     Status: None (Preliminary result)   Collection Time: 03/20/20  2:44 PM   Specimen: BLOOD  Result Value Ref Range Status   Specimen Description   Final    BLOOD LEFT ANTECUBITAL Performed at [place] [address], [place],  [address]    Special Requests  Final    BOTTLES DRAWN AEROBIC AND ANAEROBIC Blood Culture adequate volume Performed at [facility] [address], [place], [place] [address]    Culture   Final    NO GROWTH 4 DAYS Performed at [facility], [place] [address] [place], [place] [address]    Report Status PENDING  Incomplete     Time coordinating discharge: Over 30 minutes  SIGNED:   Little Ishikawa, DO [facility] 03/24/2020, 1:22 PM Pager   If 7PM-7AM, please contact night-coverage [email or web address]

## 2020-03-24 NOTE — Progress Notes (Signed)
Patient discharged home. Patient understood discharge instructions. Removed patient's IV from LFA. Patient tolerated well. Patient left via wheelchair, escorted by staff into private vehicle.

## 2020-03-24 NOTE — Progress Notes (Signed)
  Speech Language Pathology Treatment: Dysphagia  Patient Details Name: Richard Li MRN: [id number] DOB: June 08, 1960 Today's Date: 03/24/2020 Time: 1220-1230 SLP Time Calculation (min) (ACUTE ONLY): 10 min  Assessment / Plan / Recommendation Clinical Impression  Patient seen with wife present in room and focus of session on review of swallow precautions/aspiration precautions. Patient did state that his throat is feeling less sore and he is feeling better overall. SLP reviewed MBS results with recommendation for patient to continue to take sips of liquids after solids and swallow extra times to keep pharyngeal secretions clearing. Patient verbalized understanding and stated that he has been taking sips of liquids to help transit solids. Patient stated he was cleared by MD to discharge this afternoon after his radiation treatment at 2pm. If patient does not discharge today, he may benefit from one more SLP session to ensure diet toleration as he has just started eating a more moderate amount of PO's (prior to lunch today he has been eating/drinking very limited amount).     HPI HPI: This is a 59 year old male with past medical history of NSCLC, asthma, hypertension, recent admission for septic shock secondary to multifocal pneumonia from 11/19-11/23 who presented to the ED with persistent shortness of breath, cough and generalized weakness. CXR 03/20/20: Extensive airspace disease throughout the right lung concerning for pneumonia versus changes from radiation treatment. Small right pleural effusion. Findings similar to prior study.      SLP Plan  Continue with current plan of care       Recommendations  Diet recommendations: Regular;Thin liquid Liquids provided via: Cup;Straw Medication Administration: Whole meds with liquid Compensations: Small sips/bites;Slow rate;Follow solids with liquid Postural Changes and/or Swallow Maneuvers: Seated upright 90 degrees                Oral  Care Recommendations: Oral care BID;Patient independent with oral care Follow up Recommendations: None SLP Visit Diagnosis: Dysphagia, oropharyngeal phase (R13.12) Plan: Continue with current plan of care       Sonia Baller, MA, CCC-SLP Speech Therapy

## 2020-03-25 ENCOUNTER — Other Ambulatory Visit: Payer: Self-pay | Admitting: Internal Medicine

## 2020-03-25 ENCOUNTER — Ambulatory Visit: Payer: BC Managed Care – PPO

## 2020-03-25 LAB — CULTURE, BLOOD (ROUTINE X 2)
Culture: NO GROWTH
Culture: NO GROWTH
Special Requests: ADEQUATE
Special Requests: ADEQUATE

## 2020-03-25 MED FILL — POTASSIUM CHLORIDE CRYS ER: 20 | 7 days supply | Qty: 7 | Fill #0

## 2020-03-26 ENCOUNTER — Ambulatory Visit: Payer: BC Managed Care – PPO

## 2020-03-27 ENCOUNTER — Ambulatory Visit: Payer: BC Managed Care – PPO

## 2020-03-27 ENCOUNTER — Telehealth: Payer: Self-pay

## 2020-03-27 NOTE — Telephone Encounter (Signed)
Pts wife called advising since pt was d/c from the hospital with pneumonia, he is not eating or drinking much and states he's very tired. She also states he isn't using his incentive spirometry and would like to know how to make him to eat, drink and exercise his lungs like he is supposed to. She denies him having a fever at this time.  I advised the pt I can call the pt in an attempt to determine if he is experiencing any other sx. She indicated the pt hasn't been answering his phone because he continues to sleep all day. I did call the pt and LM.

## 2020-03-28 ENCOUNTER — Other Ambulatory Visit: Payer: Self-pay | Admitting: Internal Medicine

## 2020-03-28 MED ORDER — METHYLPREDNISOLONE 4 MG PO TBPK: ORAL_TABLET | ORAL | 0 refills | Status: DC

## 2020-03-28 NOTE — Telephone Encounter (Signed)
I will order Medrol Dosepak and if no improvement he may need to come to the symptom management clinic.

## 2020-03-30 NOTE — Telephone Encounter (Signed)
I spoke with pts wife and advised as indicated. She expressed understanding of this information.

## 2020-04-03 ENCOUNTER — Telehealth: Payer: Self-pay

## 2020-04-03 NOTE — Telephone Encounter (Signed)
Pt wife LM indicating pt is weak and has increased SOB.  Discussed with Cassie, PA-C who also discussed with dr. Julien Nordmann and the recommendation is for the pt to go to the ER.  I have called the pts wife back and advised as indicated. She expressed understanding of this information and said she will take him.

## 2020-04-04 ENCOUNTER — Emergency Department (HOSPITAL_COMMUNITY): Payer: BC Managed Care – PPO

## 2020-04-04 ENCOUNTER — Encounter (HOSPITAL_COMMUNITY): Payer: Self-pay

## 2020-04-04 ENCOUNTER — Inpatient Hospital Stay (HOSPITAL_COMMUNITY)
Admission: EM | Admit: 2020-04-04 | Discharge: 2020-04-07 | DRG: 871 | Disposition: A | Discharge status: Home / Self Care | Payer: BC Managed Care – PPO | Attending: Internal Medicine | Admitting: Internal Medicine

## 2020-04-04 ENCOUNTER — Other Ambulatory Visit: Payer: Self-pay

## 2020-04-04 DIAGNOSIS — J449 Chronic obstructive pulmonary disease, unspecified: Secondary | ICD-10-CM | POA: Diagnosis present

## 2020-04-04 DIAGNOSIS — N179 Acute kidney failure, unspecified: Secondary | ICD-10-CM | POA: Diagnosis present

## 2020-04-04 DIAGNOSIS — I110 Hypertensive heart disease with heart failure: Secondary | ICD-10-CM | POA: Diagnosis present

## 2020-04-04 DIAGNOSIS — F411 Generalized anxiety disorder: Secondary | ICD-10-CM | POA: Diagnosis present

## 2020-04-04 DIAGNOSIS — J189 Pneumonia, unspecified organism: Secondary | ICD-10-CM | POA: Diagnosis present

## 2020-04-04 DIAGNOSIS — R9389 Abnormal findings on diagnostic imaging of other specified body structures: Secondary | ICD-10-CM | POA: Diagnosis present

## 2020-04-04 DIAGNOSIS — Z87442 Personal history of urinary calculi: Secondary | ICD-10-CM

## 2020-04-04 DIAGNOSIS — I10 Essential (primary) hypertension: Secondary | ICD-10-CM | POA: Diagnosis not present

## 2020-04-04 DIAGNOSIS — Z923 Personal history of irradiation: Secondary | ICD-10-CM | POA: Diagnosis not present

## 2020-04-04 DIAGNOSIS — R627 Adult failure to thrive: Secondary | ICD-10-CM | POA: Diagnosis present

## 2020-04-04 DIAGNOSIS — D638 Anemia in other chronic diseases classified elsewhere: Secondary | ICD-10-CM | POA: Diagnosis present

## 2020-04-04 DIAGNOSIS — Z87891 Personal history of nicotine dependence: Secondary | ICD-10-CM

## 2020-04-04 DIAGNOSIS — K219 Gastro-esophageal reflux disease without esophagitis: Secondary | ICD-10-CM | POA: Diagnosis present

## 2020-04-04 DIAGNOSIS — Z9221 Personal history of antineoplastic chemotherapy: Secondary | ICD-10-CM

## 2020-04-04 DIAGNOSIS — R531 Weakness: Secondary | ICD-10-CM

## 2020-04-04 DIAGNOSIS — A419 Sepsis, unspecified organism: Secondary | ICD-10-CM | POA: Diagnosis present

## 2020-04-04 DIAGNOSIS — E785 Hyperlipidemia, unspecified: Secondary | ICD-10-CM | POA: Diagnosis present

## 2020-04-04 DIAGNOSIS — Z8701 Personal history of pneumonia (recurrent): Secondary | ICD-10-CM | POA: Diagnosis not present

## 2020-04-04 DIAGNOSIS — Z803 Family history of malignant neoplasm of breast: Secondary | ICD-10-CM | POA: Diagnosis not present

## 2020-04-04 DIAGNOSIS — Z8249 Family history of ischemic heart disease and other diseases of the circulatory system: Secondary | ICD-10-CM

## 2020-04-04 DIAGNOSIS — E538 Deficiency of other specified B group vitamins: Secondary | ICD-10-CM | POA: Diagnosis present

## 2020-04-04 DIAGNOSIS — E43 Unspecified severe protein-calorie malnutrition: Secondary | ICD-10-CM | POA: Diagnosis present

## 2020-04-04 DIAGNOSIS — J309 Allergic rhinitis, unspecified: Secondary | ICD-10-CM | POA: Diagnosis present

## 2020-04-04 DIAGNOSIS — J9811 Atelectasis: Secondary | ICD-10-CM | POA: Diagnosis present

## 2020-04-04 DIAGNOSIS — R630 Anorexia: Secondary | ICD-10-CM | POA: Diagnosis present

## 2020-04-04 DIAGNOSIS — I5033 Acute on chronic diastolic (congestive) heart failure: Secondary | ICD-10-CM | POA: Diagnosis present

## 2020-04-04 DIAGNOSIS — F32A Depression, unspecified: Secondary | ICD-10-CM | POA: Diagnosis present

## 2020-04-04 DIAGNOSIS — Z20822 Contact with and (suspected) exposure to covid-19: Secondary | ICD-10-CM | POA: Diagnosis present

## 2020-04-04 DIAGNOSIS — Z79899 Other long term (current) drug therapy: Secondary | ICD-10-CM

## 2020-04-04 DIAGNOSIS — Z825 Family history of asthma and other chronic lower respiratory diseases: Secondary | ICD-10-CM | POA: Diagnosis not present

## 2020-04-04 DIAGNOSIS — E663 Overweight: Secondary | ICD-10-CM | POA: Diagnosis present

## 2020-04-04 DIAGNOSIS — J440 Chronic obstructive pulmonary disease with acute lower respiratory infection: Secondary | ICD-10-CM | POA: Diagnosis present

## 2020-04-04 DIAGNOSIS — Z6824 Body mass index (BMI) 24.0-24.9, adult: Secondary | ICD-10-CM

## 2020-04-04 DIAGNOSIS — C3491 Malignant neoplasm of unspecified part of right bronchus or lung: Secondary | ICD-10-CM | POA: Diagnosis present

## 2020-04-04 DIAGNOSIS — Z9889 Other specified postprocedural states: Secondary | ICD-10-CM

## 2020-04-04 LAB — CBC
HCT: 28.8 % — ABNORMAL LOW (ref 39.0–52.0)
HCT: 25.4 % — ABNORMAL LOW (ref 39.0–52.0)
Hemoglobin: 9.5 g/dL — ABNORMAL LOW (ref 13.0–17.0)
Hemoglobin: 8.2 g/dL — ABNORMAL LOW (ref 13.0–17.0)
MCH: 29.5 pg (ref 26.0–34.0)
MCH: 28.6 pg (ref 26.0–34.0)
MCHC: 33 g/dL (ref 30.0–36.0)
MCHC: 32.3 g/dL (ref 30.0–36.0)
MCV: 89.4 fL (ref 80.0–100.0)
MCV: 88.5 fL (ref 80.0–100.0)
Platelets: 853 10*3/uL — ABNORMAL HIGH (ref 150–400)
Platelets: 608 10*3/uL — ABNORMAL HIGH (ref 150–400)
RBC: 3.22 MIL/uL — ABNORMAL LOW (ref 4.22–5.81)
RBC: 2.87 MIL/uL — ABNORMAL LOW (ref 4.22–5.81)
RDW: 13.6 % (ref 11.5–15.5)
RDW: 13.4 % (ref 11.5–15.5)
WBC: 17.1 10*3/uL — ABNORMAL HIGH (ref 4.0–10.5)
WBC: 14.3 10*3/uL — ABNORMAL HIGH (ref 4.0–10.5)
nRBC: 0 % (ref 0.0–0.2)
nRBC: 0 % (ref 0.0–0.2)

## 2020-04-04 LAB — URINALYSIS, ROUTINE W REFLEX MICROSCOPIC
Bilirubin Urine: NEGATIVE
Glucose, UA: NEGATIVE mg/dL
Hgb urine dipstick: NEGATIVE
Ketones, ur: NEGATIVE mg/dL
Leukocytes,Ua: NEGATIVE
Nitrite: NEGATIVE
Protein, ur: NEGATIVE mg/dL
Specific Gravity, Urine: 1.006 (ref 1.005–1.030)
pH: 6 (ref 5.0–8.0)

## 2020-04-04 LAB — CREATININE, SERUM
Creatinine, Ser: 0.84 mg/dL (ref 0.61–1.24)
GFR, Estimated: >60 mL/min (ref >60)

## 2020-04-04 LAB — LACTIC ACID, PLASMA
Lactic Acid, Venous: 3.2 mmol/L (ref 0.5–1.9)
Lactic Acid, Venous: 1.4 mmol/L (ref 0.5–1.9)

## 2020-04-04 LAB — BASIC METABOLIC PANEL
Anion gap: 13 (ref 5–15)
BUN: 19 mg/dL (ref 6–20)
CO2: 25 mmol/L (ref 22–32)
Calcium: 9.3 mg/dL (ref 8.9–10.3)
Chloride: 96 mmol/L — ABNORMAL LOW (ref 98–111)
Creatinine, Ser: 1.16 mg/dL (ref 0.61–1.24)
GFR, Estimated: >60 mL/min (ref >60)
Glucose, Bld: 133 mg/dL — ABNORMAL HIGH (ref 70–99)
Potassium: 3.7 mmol/L (ref 3.5–5.1)
Sodium: 134 mmol/L — ABNORMAL LOW (ref 135–145)

## 2020-04-04 LAB — APTT: aPTT: 38 seconds — ABNORMAL HIGH (ref 24–36)

## 2020-04-04 LAB — PROTIME-INR
INR: 1.2 (ref 0.8–1.2)
Prothrombin Time: 15 seconds (ref 11.4–15.2)

## 2020-04-04 LAB — RESP PANEL BY RT-PCR (FLU A&B, COVID) ARPGX2
Influenza A by PCR: NEGATIVE
Influenza B by PCR: NEGATIVE
SARS Coronavirus 2 by RT PCR: NEGATIVE

## 2020-04-04 LAB — TROPONIN I (HIGH SENSITIVITY)
Troponin I (High Sensitivity): 6 ng/L (ref <18)
Troponin I (High Sensitivity): 4 ng/L (ref <18)

## 2020-04-04 IMAGING — DX DG CHEST 1V PORT
1 series · 1 of 1 positions shown · non-contrast
Comparison: Chest x-ray [DATE].

CLINICAL DATA: 59-year-old male with history of increasing
shortness of breath.

EXAM:
PORTABLE CHEST 1 VIEW

[chest ap]
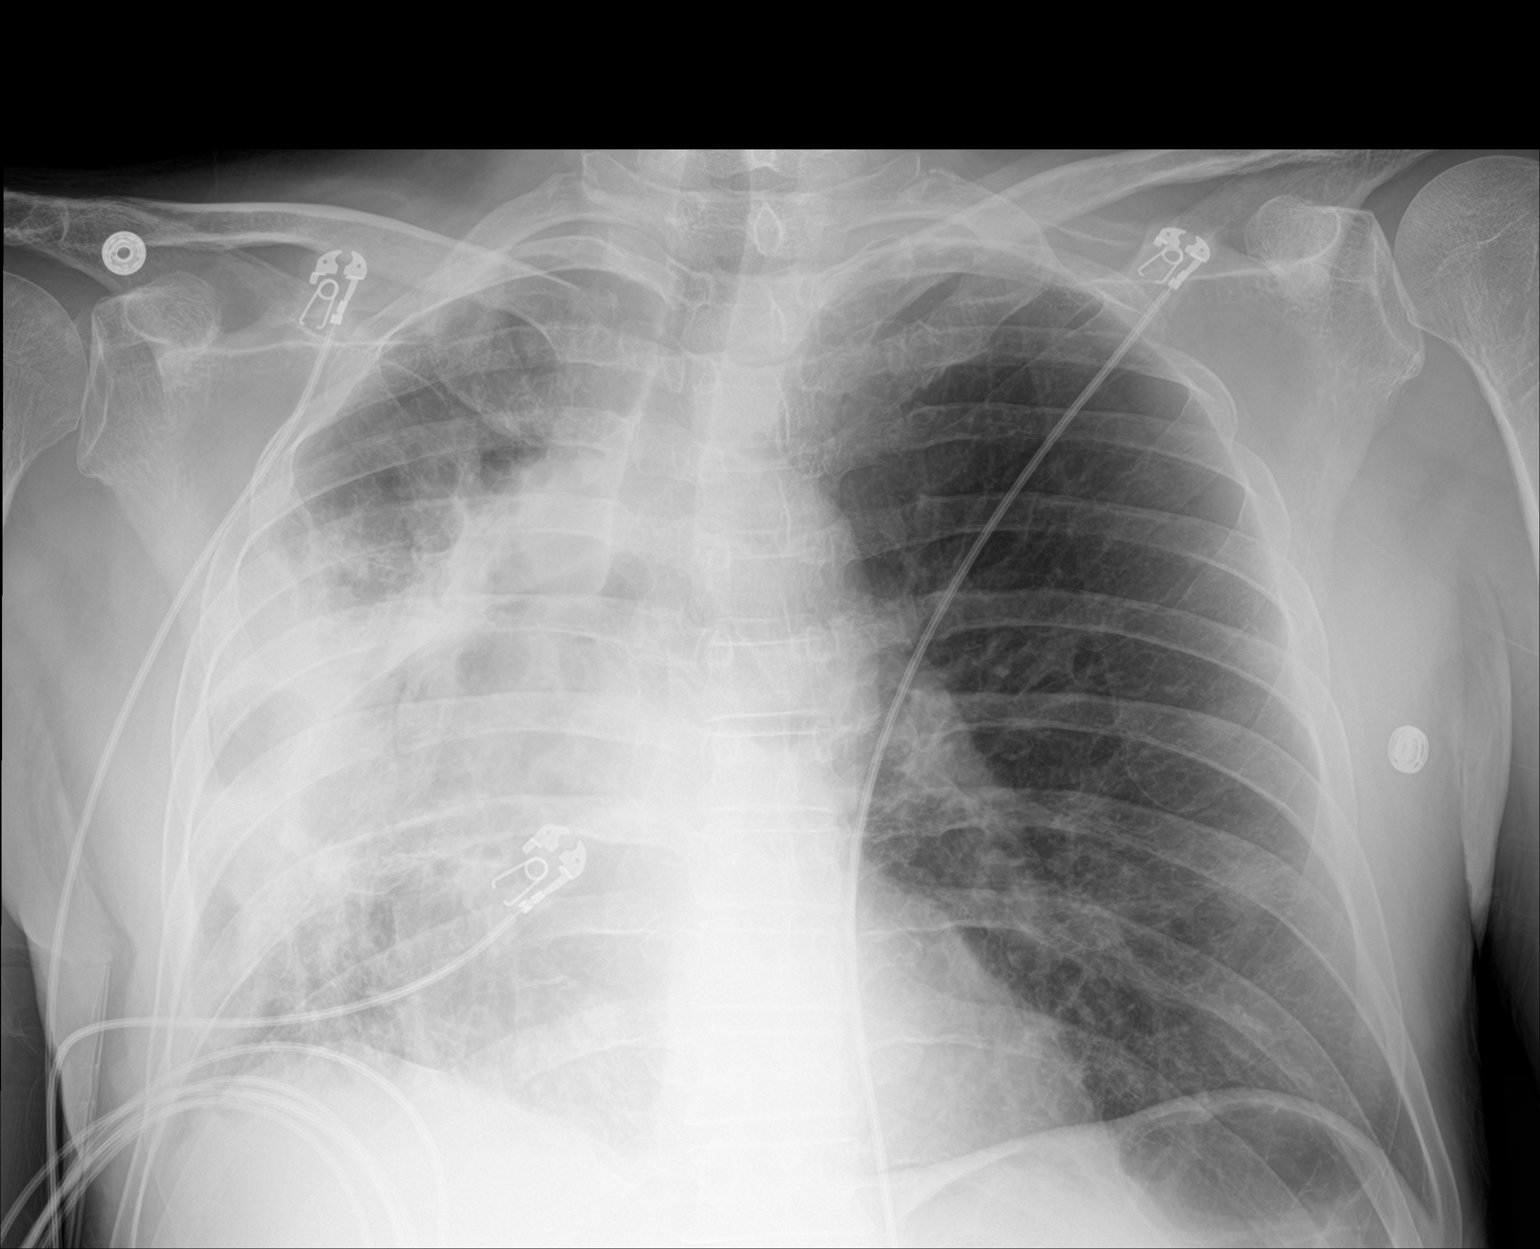

[1 of 1 positions shown; findings below may reference images not displayed]

FINDINGS: Significant worsening aeration in the right lung, which may reflect
worsening atelectasis and/or consolidation, most evident throughout
the right mid to lower lung. Slight rightward shift of
cardiomediastinal structures, indicative of substantial right-sided
volume loss. Left lung appears relatively clear, with exception of
several nodular densities which could represent areas of infectious
consolidation, or metastatic lesions. Probable small right pleural
effusion. No definite left pleural effusion. No pneumothorax. Heart
size is normal.
IMPRESSION: 1. Substantially worsened aeration in the right lung, which in large
part appears to reflect worsening atelectasis, although there is
also likely worsening airspace disease as well.
2. The multiple left-sided pulmonary nodules are again noted, which
could be infectious or neoplastic in etiology. Continued attention
on follow-up studies is recommended.

## 2020-04-04 MED ORDER — LACTATED RINGERS IV BOLUS (SEPSIS)
1000.0000 mL | Freq: Once | INTRAVENOUS | Status: AC
  Administered 2020-04-04: 1000 mL via INTRAVENOUS

## 2020-04-04 MED ORDER — VANCOMYCIN HCL IN DEXTROSE 1-5 GM/200ML-% IV SOLN
1000.0000 mg | Freq: Once | INTRAVENOUS | Status: AC
  Administered 2020-04-04: 1000 mg via INTRAVENOUS
  Filled 2020-04-04: qty 200

## 2020-04-04 MED ORDER — LACTATED RINGERS IV SOLN: INTRAVENOUS | Status: AC

## 2020-04-04 MED ORDER — VANCOMYCIN HCL IN DEXTROSE 1-5 GM/200ML-% IV SOLN
1000.0000 mg | Freq: Two times a day (BID) | INTRAVENOUS | Status: DC
  Administered 2020-04-04 – 2020-04-06 (×4): 1000 mg via INTRAVENOUS
  Filled 2020-04-04 (×7): qty 200

## 2020-04-04 MED ORDER — SODIUM CHLORIDE 0.9 % IV SOLN
2.0000 g | Freq: Three times a day (TID) | INTRAVENOUS | Status: DC
  Administered 2020-04-04 – 2020-04-07 (×8): 2 g via INTRAVENOUS
  Filled 2020-04-04 (×8): qty 2

## 2020-04-04 MED ORDER — ACETAMINOPHEN 325 MG PO TABS
650.0000 mg | ORAL_TABLET | Freq: Four times a day (QID) | ORAL | Status: DC | PRN
  Administered 2020-04-04 – 2020-04-06 (×3): 650 mg via ORAL
  Filled 2020-04-04 (×3): qty 2

## 2020-04-04 MED ORDER — ACETAMINOPHEN 650 MG RE SUPP: 650.0000 mg | Freq: Four times a day (QID) | RECTAL | Status: DC | PRN

## 2020-04-04 MED ORDER — DM-GUAIFENESIN ER 30-600 MG PO TB12
1.0000 | ORAL_TABLET | Freq: Two times a day (BID) | ORAL | Status: DC
  Administered 2020-04-04 – 2020-04-07 (×7): 1 via ORAL
  Filled 2020-04-04 (×7): qty 1

## 2020-04-04 MED ORDER — DEXTROMETHORPHAN POLISTIREX ER 30 MG/5ML PO SUER
30.0000 mg | Freq: Every evening | ORAL | Status: DC | PRN
  Administered 2020-04-04 – 2020-04-06 (×3): 30 mg via ORAL
  Filled 2020-04-04 (×5): qty 5

## 2020-04-04 MED ORDER — IPRATROPIUM-ALBUTEROL 0.5-2.5 (3) MG/3ML IN SOLN: 3.0000 mL | Freq: Four times a day (QID) | RESPIRATORY_TRACT | Status: DC | PRN

## 2020-04-04 MED ORDER — METHYLPREDNISOLONE SODIUM SUCC 40 MG IJ SOLR
40.0000 mg | Freq: Two times a day (BID) | INTRAMUSCULAR | Status: DC
  Administered 2020-04-04 – 2020-04-06 (×4): 40 mg via INTRAVENOUS
  Filled 2020-04-04 (×4): qty 1

## 2020-04-04 MED ORDER — ENOXAPARIN SODIUM 40 MG/0.4ML ~~LOC~~ SOLN
40.0000 mg | SUBCUTANEOUS | Status: DC
  Administered 2020-04-04 – 2020-04-06 (×3): 40 mg via SUBCUTANEOUS
  Filled 2020-04-04 (×3): qty 0.4

## 2020-04-04 MED ORDER — SODIUM CHLORIDE 0.9 % IV SOLN
2.0000 g | Freq: Once | INTRAVENOUS | Status: AC
  Administered 2020-04-04: 2 g via INTRAVENOUS
  Filled 2020-04-04: qty 2

## 2020-04-04 MED ORDER — FENOFIBRATE 160 MG PO TABS
160.0000 mg | ORAL_TABLET | Freq: Every day | ORAL | Status: DC
  Administered 2020-04-05 – 2020-04-07 (×3): 160 mg via ORAL
  Filled 2020-04-04 (×3): qty 1

## 2020-04-04 MED ORDER — POLYETHYLENE GLYCOL 3350 17 G PO PACK: 17.0000 g | PACK | Freq: Every day | ORAL | Status: DC | PRN

## 2020-04-04 MED ORDER — LORAZEPAM 1 MG PO TABS
1.0000 mg | ORAL_TABLET | Freq: Three times a day (TID) | ORAL | Status: DC | PRN
  Administered 2020-04-04 – 2020-04-06 (×3): 1 mg via ORAL
  Filled 2020-04-04 (×3): qty 1

## 2020-04-04 MED ORDER — FOLIC ACID 1 MG PO TABS
1.0000 mg | ORAL_TABLET | Freq: Every day | ORAL | Status: DC
  Administered 2020-04-05 – 2020-04-07 (×3): 1 mg via ORAL
  Filled 2020-04-04 (×3): qty 1

## 2020-04-04 MED ORDER — PROCHLORPERAZINE MALEATE 10 MG PO TABS
10.0000 mg | ORAL_TABLET | Freq: Four times a day (QID) | ORAL | Status: DC | PRN
  Filled 2020-04-04: qty 1

## 2020-04-04 NOTE — H&P (View-Only) (Signed)
Name: Richard Li MRN: [id number] DOB: May 25, 1960    ADMISSION DATE:  04/04/2020 CONSULTATION DATE:  04/04/2020   REFERRING MD :  Jamse Arn, triad  CHIEF COMPLAINT: Recurrent pneumonia  BRIEF PATIENT DESCRIPTION: 59 year old with stage II adenocarcinoma diagnosed 12/2019 undergoing concurrent chemoradiation presenting with recurrent pneumonia.  [name] for the same problem , extensive infiltrate on the right  SIGNIFICANT EVENTS  admitted 11/11-11/14 and treated with IV antibiotics. readmitted 11/19 for multifocal infiltrates in the right and treated with vancomycin and cefepime.  CT angiogram was performed. admitted 11/26 -11/30this time with fevers 201 septic shock, swallow evaluation suggested only mild aspiration risk 11/29  STUDIES:  CT angiogram chest 11/19 >> extensive patchy consolidation groundglass opacity throughout the right lung, cavitary right upper lung mass is obscured, new subcarinal right hilar lymphadenopathy Swallow evaluation 11/29 mild aspiration risk   HISTORY OF PRESENT ILLNESS: This is the fourth admission for this 59 year old unfortunate man who was diagnosed with T3 N0 M0 adenocarcinoma, stage II when he presented with right upper lobe perihilar cavitary mass with 7 mm subcarinal lymph node and mildly hypermetabolic groundglass opacities in the right middle lobe and left upper lobe.  He was considered not a candidate for surgery and was started on concurrent chemoradiation.  Completed radiation therapy early November and completed 4 cycles of chemotherapy when he developed right-sided infiltrate and was initially treated with outpatient oral Levaquin for community-acquired pneumonia.  He was then admitted 11/11-11/14 and treated with IV antibiotics. He was again readmitted 11/19 for multifocal infiltrates in the right and treated with vancomycin and cefepime.  CT angiogram was performed. He was again admitted 11/26 -11/30this time with  fevers 201 septic shock, swallow evaluation suggested only mild aspiration risk 11/29  His last chemotherapy was 11/15 his last radiation was 11/30. He is now admitted with generalized weakness, cough productive of yellow-green sputum and increased shortness of breath.  He can only walk a few yards before he gets dyspneic.  He was just treated with a Medrol Dosepak without significant relief. Noted to be mildly hypotensive in the ED, improved with 3 L of fluids, lactate was 3.2, WBC count 17.1.  Chest x-ray shows extensive multifocal infiltrate on the right , to my review there is mediastinal shift and indicating some right sided volume loss  PCCM is consulted to assist with management    PAST MEDICAL HISTORY :   has a past medical history of Allergic rhinitis, cause unspecified, Anxiety state, unspecified, Asthma, Chronic airway obstruction, not elsewhere classified, Esophageal reflux, History of kidney stones, Hypertension, Lumbago, Other and unspecified hyperlipidemia, and Other chest pain.  has a past surgical history that includes Appendectomy; Nasal turbinate reduction (2002); Vasectomy; Colonoscopy (15 years ago); Video bronchoscopy with endobronchial ultrasound (N/A, 01/07/2020); Video bronchoscopy with endobronchial navigation (N/A, 01/07/2020); Bronchial needle aspiration biopsy (01/07/2020); Bronchial biopsy (01/07/2020); Bronchial brushings (01/07/2020); Hemostasis control (01/07/2020); and Bronchial washings (01/07/2020). Prior to Admission medications   Medication Sig Start Date End Date Taking? Authorizing Provider  acetaminophen (TYLENOL) 500 MG tablet Take 1,000 mg by mouth every 6 (six) hours as needed for mild pain.   Yes [provider]  dextromethorphan (DELSYM) 30 MG/5ML liquid Take 30 mg by mouth at bedtime as needed for cough.   Yes [provider]  dextromethorphan-guaiFENesin (MUCINEX DM) 30-600 MG 12hr tablet Take 1 tablet by mouth 2 (two) times daily.  03/24/20  Yes Little Ishikawa, MD  fenofibrate 160 MG tablet Take 1 tablet (160  mg total) by mouth daily. 04/17/19  Yes [name], DO  folic acid (FOLVITE) 1 MG tablet Take 1 tablet (1 mg total) by mouth daily. 03/08/20  Yes Patrecia Pour, MD  Ipratropium-Albuterol (COMBIVENT RESPIMAT) 20-100 MCG/ACT AERS respimat Inhale 1-2 puffs into the lungs every 6 (six) hours as needed for wheezing. 01/22/20  Yes [name], DO  LORazepam (ATIVAN) 1 MG tablet TAKE 1 TABLET BY MOUTH THREE TIMES DAILY AS NEEDED FOR ANXIETY Patient taking differently: Take 1 mg by mouth See admin instructions. Take 1 mg by mouth at bedtime and an additional 1 mg up to two times a day as needed for anxiety 01/27/20  Yes [name], DO  losartan (COZAAR) 100 MG tablet Take 1 tablet (100 mg total) by mouth daily. 04/17/19  Yes [name], DO  prochlorperazine (COMPAZINE) 10 MG tablet TAKE 1 TABLET BY MOUTH EVERY 6 HOURS AS NEEDED FOR NAUSEA FOR VOMITING Patient taking differently: Take 10 mg by mouth every 6 (six) hours as needed for nausea or vomiting. 03/26/20  Yes Curt Bears, MD  methylPREDNISolone (MEDROL DOSEPAK) 4 MG TBPK tablet Use as instructed. Patient not taking: No sig reported 03/28/20   Curt Bears, MD  Multiple Vitamin (MULTIVITAMIN WITH MINERALS) TABS tablet Take 1 tablet by mouth daily. 03/18/20   Mercy Riding, MD  potassium chloride SA (KLOR-CON) 20 MEQ tablet TAKE 1 TABLET BY MOUTH ONCE DAILY Patient not taking: No sig reported 03/24/20   Curt Bears, MD  traZODone (DESYREL) 50 MG tablet TAKE 1 TO 2 TABLETS BY MOUTH AT BEDTIME AS NEEDED FOR SLEEP Patient taking differently: Take 50 mg by mouth at bedtime. 01/28/20   [name], DO   No Known Allergies  FAMILY HISTORY:  family history includes Breast cancer in his mother; COPD in his maternal uncle; Cancer in his father; Emphysema in his maternal uncle; Heart attack in his brother; Heart disease in his maternal uncle. SOCIAL  HISTORY:  reports that he quit smoking about 14 years ago. His smoking use included cigarettes. He has a 56.00 pack-year smoking history. He has never used smokeless tobacco. He reports current alcohol use of about 12.0 standard drinks of alcohol per week. He reports that he does not use drugs.  REVIEW OF SYSTEMS:   Generalized weakness Cough yellow-green sputum Shortness of breath walking a few yards No hemoptysis  Constitutional: Negative for fever, chills, weight loss, malaise/fatigue and diaphoresis.  HENT: Negative for hearing loss, ear pain, nosebleeds, congestion, sore throat, neck pain, tinnitus and ear discharge.   Eyes: Negative for blurred vision, double vision, photophobia, pain, discharge and redness.   Cardiovascular: Negative for chest pain, palpitations, orthopnea, claudication, leg swelling and PND.  Gastrointestinal: Negative for heartburn, nausea, vomiting, abdominal pain, diarrhea, constipation, blood in stool and melena.  Genitourinary: Negative for dysuria, urgency, frequency, hematuria and flank pain.  Musculoskeletal: Negative for myalgias, back pain, joint pain and falls.  Skin: Negative for itching and rash.  Neurological: Negative for dizziness, tingling, tremors, sensory change, speech change, focal weakness, seizures, loss of consciousness, weakness and headaches.  Endo/Heme/Allergies: Negative for environmental allergies and polydipsia. Does not bruise/bleed easily.  SUBJECTIVE:   VITAL SIGNS: Temp:  [97.5 F (36.4 C)] 97.5 F (36.4 C) (12/11 1017) Pulse Rate:  [97-147] 102 (12/11 1615) Resp:  [17-31] 24 (12/11 1615) BP: (81-122)/(53-70) 115/62 (12/11 1615) SpO2:  [93 %-100 %] 96 % (12/11 1615) Weight:  [89.8 kg] 89.8 kg (12/11 1536)  PHYSICAL EXAMINATION: Gen:  Middle-age man, no distress  HEENT:  EOMI, sclera anicteric, mild pallor Neck:     No JVD; no thyromegaly Lungs:   Coarse breath sounds on right, clear on left CV:         Regular rate  and rhythm; no murmurs Abd:      + bowel sounds; soft, non-tender; no palpable masses, no distension Ext:    No edema; adequate peripheral perfusion Skin:      Warm and dry; no rash Neuro: alert and oriented x 3, soft voice, appears weak and deconditioned   Recent Labs  Lab 04/04/20 1040  NA 134*  K 3.7  CL 96*  CO2 25  BUN 19  CREATININE 1.16  GLUCOSE 133*   Recent Labs  Lab 04/04/20 1040  HGB 9.5*  HCT 28.8*  WBC 17.1*  PLT 853*   DG Chest Port 1 View  Result Date: 04/04/2020 CLINICAL DATA:  59 year old male with history of increasing shortness of breath. EXAM: PORTABLE CHEST 1 VIEW COMPARISON:  Chest x-ray 03/20/2020. FINDINGS: Significant worsening aeration in the right lung, which may reflect worsening atelectasis and/or consolidation, most evident throughout the right mid to lower lung. Slight rightward shift of cardiomediastinal structures, indicative of substantial right-sided volume loss. Left lung appears relatively clear, with exception of several nodular densities which could represent areas of infectious consolidation, or metastatic lesions. Probable small right pleural effusion. No definite left pleural effusion. No pneumothorax. Heart size is normal. IMPRESSION: 1. Substantially worsened aeration in the right lung, which in large part appears to reflect worsening atelectasis, although there is also likely worsening airspace disease as well. 2. The multiple left-sided pulmonary nodules are again noted, which could be infectious or neoplastic in etiology. Continued attention on follow-up studies is recommended. Electronically Signed   By: Vinnie Langton M.D.   On: 04/04/2020 10:53    ASSESSMENT / PLAN:  -Multifocal pneumonia on the right -progressive worsening of infiltrates over the last month on serial imaging, current chest x-ray seems to indicate some degree of volume loss on the right  -Underlying stage II adenocarcinoma lung on concurrent  chemoradiation  Differential here includes radiation pneumonitis, less likely resistant bug since he has received really broad-spectrum antibiotics during hospital admissions, uncommon organism seems unlikely since this seems to have progressed on treatment-no reason to suspect AFB or fungal etiology.  Small possibility of tracheoesophageal fistula exists however this appears early in the time course for radiation and previous swallow evaluation has been normal  Regardless, he would need bronchoscopy to further clarify about differential diagnosis  Recommend- I have scheduled this for 11 PM tomorrow, he can be kept n.p.o. after midnight Meanwhile continue broad-spectrum antibiotics as you are doing, await culture data -Add IV Solu-Medrol 40 every 12 for radiation pneumonitis, if bronchoscopic findings are negative would treat with extended duration of steroids  I discussed this plan of care with the patient and his wife at the bedside and evidence understanding  Kara Mead MD. Shade Flood.  Pulmonary & Critical care See Amion for pager  If no response to pager , please call [phone]  After 7:00 pm call Elink  [phone]    04/04/2020, 4:33 PM

## 2020-04-04 NOTE — Consult Note (Signed)
Name: Richard Li MRN: [id number] DOB: June 02, 1960    ADMISSION DATE:  04/04/2020 CONSULTATION DATE:  04/04/2020   REFERRING MD :  Richard Li, triad  CHIEF COMPLAINT: Recurrent pneumonia  BRIEF PATIENT DESCRIPTION: 59 year old with stage II adenocarcinoma diagnosed 12/2019 undergoing concurrent chemoradiation presenting with recurrent pneumonia.  [name] for the same problem , extensive infiltrate on the right  SIGNIFICANT EVENTS  admitted 11/11-11/14 and treated with IV antibiotics. readmitted 11/19 for multifocal infiltrates in the right and treated with vancomycin and cefepime.  CT angiogram was performed. admitted 11/26 -11/30this time with fevers 201 septic shock, swallow evaluation suggested only mild aspiration risk 11/29  STUDIES:  CT angiogram chest 11/19 >> extensive patchy consolidation groundglass opacity throughout the right lung, cavitary right upper lung mass is obscured, new subcarinal right hilar lymphadenopathy Swallow evaluation 11/29 mild aspiration risk   HISTORY OF PRESENT ILLNESS: This is the fourth admission for this 59 year old unfortunate man who was diagnosed with T3 N0 M0 adenocarcinoma, stage II when he presented with right upper lobe perihilar cavitary mass with 7 mm subcarinal lymph node and mildly hypermetabolic groundglass opacities in the right middle lobe and left upper lobe.  He was considered not a candidate for surgery and was started on concurrent chemoradiation.  Completed radiation therapy early November and completed 4 cycles of chemotherapy when he developed right-sided infiltrate and was initially treated with outpatient oral Levaquin for community-acquired pneumonia.  He was then admitted 11/11-11/14 and treated with IV antibiotics. He was again readmitted 11/19 for multifocal infiltrates in the right and treated with vancomycin and cefepime.  CT angiogram was performed. He was again admitted 11/26 -11/30this time with  fevers 201 septic shock, swallow evaluation suggested only mild aspiration risk 11/29  His last chemotherapy was 11/15 his last radiation was 11/30. He is now admitted with generalized weakness, cough productive of yellow-green sputum and increased shortness of breath.  He can only walk a few yards before he gets dyspneic.  He was just treated with a Medrol Dosepak without significant relief. Noted to be mildly hypotensive in the ED, improved with 3 L of fluids, lactate was 3.2, WBC count 17.1.  Chest x-ray shows extensive multifocal infiltrate on the right , to my review there is mediastinal shift and indicating some right sided volume loss  PCCM is consulted to assist with management    PAST MEDICAL HISTORY :   has a past medical history of Allergic rhinitis, cause unspecified, Anxiety state, unspecified, Asthma, Chronic airway obstruction, not elsewhere classified, Esophageal reflux, History of kidney stones, Hypertension, Lumbago, Other and unspecified hyperlipidemia, and Other chest pain.  has a past surgical history that includes Appendectomy; Nasal turbinate reduction (2002); Vasectomy; Colonoscopy (15 years ago); Video bronchoscopy with endobronchial ultrasound (N/A, 01/07/2020); Video bronchoscopy with endobronchial navigation (N/A, 01/07/2020); Bronchial needle aspiration biopsy (01/07/2020); Bronchial biopsy (01/07/2020); Bronchial brushings (01/07/2020); Hemostasis control (01/07/2020); and Bronchial washings (01/07/2020). Prior to Admission medications   Medication Sig Start Date End Date Taking? Authorizing Provider  acetaminophen (TYLENOL) 500 MG tablet Take 1,000 mg by mouth every 6 (six) hours as needed for mild pain.   Yes [provider]  dextromethorphan (DELSYM) 30 MG/5ML liquid Take 30 mg by mouth at bedtime as needed for cough.   Yes [provider]  dextromethorphan-guaiFENesin (MUCINEX DM) 30-600 MG 12hr tablet Take 1 tablet by mouth 2 (two) times daily.  03/24/20  Yes Little Ishikawa, MD  fenofibrate 160 MG tablet Take 1 tablet (160  mg total) by mouth daily. 04/17/19  Yes [name], DO  folic acid (FOLVITE) 1 MG tablet Take 1 tablet (1 mg total) by mouth daily. 03/08/20  Yes Patrecia Pour, MD  Ipratropium-Albuterol (COMBIVENT RESPIMAT) 20-100 MCG/ACT AERS respimat Inhale 1-2 puffs into the lungs every 6 (six) hours as needed for wheezing. 01/22/20  Yes [name], DO  LORazepam (ATIVAN) 1 MG tablet TAKE 1 TABLET BY MOUTH THREE TIMES DAILY AS NEEDED FOR ANXIETY Patient taking differently: Take 1 mg by mouth See admin instructions. Take 1 mg by mouth at bedtime and an additional 1 mg up to two times a day as needed for anxiety 01/27/20  Yes [name], DO  losartan (COZAAR) 100 MG tablet Take 1 tablet (100 mg total) by mouth daily. 04/17/19  Yes [name], DO  prochlorperazine (COMPAZINE) 10 MG tablet TAKE 1 TABLET BY MOUTH EVERY 6 HOURS AS NEEDED FOR NAUSEA FOR VOMITING Patient taking differently: Take 10 mg by mouth every 6 (six) hours as needed for nausea or vomiting. 03/26/20  Yes Curt Bears, MD  methylPREDNISolone (MEDROL DOSEPAK) 4 MG TBPK tablet Use as instructed. Patient not taking: No sig reported 03/28/20   Curt Bears, MD  Multiple Vitamin (MULTIVITAMIN WITH MINERALS) TABS tablet Take 1 tablet by mouth daily. 03/18/20   Mercy Riding, MD  potassium chloride SA (KLOR-CON) 20 MEQ tablet TAKE 1 TABLET BY MOUTH ONCE DAILY Patient not taking: No sig reported 03/24/20   Curt Bears, MD  traZODone (DESYREL) 50 MG tablet TAKE 1 TO 2 TABLETS BY MOUTH AT BEDTIME AS NEEDED FOR SLEEP Patient taking differently: Take 50 mg by mouth at bedtime. 01/28/20   [name], DO   No Known Allergies  FAMILY HISTORY:  family history includes Breast cancer in his mother; COPD in his maternal uncle; Cancer in his father; Emphysema in his maternal uncle; Heart attack in his brother; Heart disease in his maternal uncle. SOCIAL  HISTORY:  reports that he quit smoking about 14 years ago. His smoking use included cigarettes. He has a 56.00 pack-year smoking history. He has never used smokeless tobacco. He reports current alcohol use of about 12.0 standard drinks of alcohol per week. He reports that he does not use drugs.  REVIEW OF SYSTEMS:   Generalized weakness Cough yellow-green sputum Shortness of breath walking a few yards No hemoptysis  Constitutional: Negative for fever, chills, weight loss, malaise/fatigue and diaphoresis.  HENT: Negative for hearing loss, ear pain, nosebleeds, congestion, sore throat, neck pain, tinnitus and ear discharge.   Eyes: Negative for blurred vision, double vision, photophobia, pain, discharge and redness.   Cardiovascular: Negative for chest pain, palpitations, orthopnea, claudication, leg swelling and PND.  Gastrointestinal: Negative for heartburn, nausea, vomiting, abdominal pain, diarrhea, constipation, blood in stool and melena.  Genitourinary: Negative for dysuria, urgency, frequency, hematuria and flank pain.  Musculoskeletal: Negative for myalgias, back pain, joint pain and falls.  Skin: Negative for itching and rash.  Neurological: Negative for dizziness, tingling, tremors, sensory change, speech change, focal weakness, seizures, loss of consciousness, weakness and headaches.  Endo/Heme/Allergies: Negative for environmental allergies and polydipsia. Does not bruise/bleed easily.  SUBJECTIVE:   VITAL SIGNS: Temp:  [97.5 F (36.4 C)] 97.5 F (36.4 C) (12/11 1017) Pulse Rate:  [97-147] 102 (12/11 1615) Resp:  [17-31] 24 (12/11 1615) BP: (81-122)/(53-70) 115/62 (12/11 1615) SpO2:  [93 %-100 %] 96 % (12/11 1615) Weight:  [89.8 kg] 89.8 kg (12/11 1536)  PHYSICAL EXAMINATION: Gen:  Middle-age man, no distress  HEENT:  EOMI, sclera anicteric, mild pallor Neck:     No JVD; no thyromegaly Lungs:   Coarse breath sounds on right, clear on left CV:         Regular rate  and rhythm; no murmurs Abd:      + bowel sounds; soft, non-tender; no palpable masses, no distension Ext:    No edema; adequate peripheral perfusion Skin:      Warm and dry; no rash Neuro: alert and oriented x 3, soft voice, appears weak and deconditioned   Recent Labs  Lab 04/04/20 1040  NA 134*  K 3.7  CL 96*  CO2 25  BUN 19  CREATININE 1.16  GLUCOSE 133*   Recent Labs  Lab 04/04/20 1040  HGB 9.5*  HCT 28.8*  WBC 17.1*  PLT 853*   DG Chest Port 1 View  Result Date: 04/04/2020 CLINICAL DATA:  59 year old male with history of increasing shortness of breath. EXAM: PORTABLE CHEST 1 VIEW COMPARISON:  Chest x-ray 03/20/2020. FINDINGS: Significant worsening aeration in the right lung, which may reflect worsening atelectasis and/or consolidation, most evident throughout the right mid to lower lung. Slight rightward shift of cardiomediastinal structures, indicative of substantial right-sided volume loss. Left lung appears relatively clear, with exception of several nodular densities which could represent areas of infectious consolidation, or metastatic lesions. Probable small right pleural effusion. No definite left pleural effusion. No pneumothorax. Heart size is normal. IMPRESSION: 1. Substantially worsened aeration in the right lung, which in large part appears to reflect worsening atelectasis, although there is also likely worsening airspace disease as well. 2. The multiple left-sided pulmonary nodules are again noted, which could be infectious or neoplastic in etiology. Continued attention on follow-up studies is recommended. Electronically Signed   By: Vinnie Langton M.D.   On: 04/04/2020 10:53    ASSESSMENT / PLAN:  -Multifocal pneumonia on the right -progressive worsening of infiltrates over the last month on serial imaging, current chest x-ray seems to indicate some degree of volume loss on the right  -Underlying stage II adenocarcinoma lung on concurrent  chemoradiation  Differential here includes radiation pneumonitis, less likely resistant bug since he has received really broad-spectrum antibiotics during hospital admissions, uncommon organism seems unlikely since this seems to have progressed on treatment-no reason to suspect AFB or fungal etiology.  Small possibility of tracheoesophageal fistula exists however this appears early in the time course for radiation and previous swallow evaluation has been normal  Regardless, he would need bronchoscopy to further clarify about differential diagnosis  Recommend- I have scheduled this for 11 PM tomorrow, he can be kept n.p.o. after midnight Meanwhile continue broad-spectrum antibiotics as you are doing, await culture data -Add IV Solu-Medrol 40 every 12 for radiation pneumonitis, if bronchoscopic findings are negative would treat with extended duration of steroids  I discussed this plan of care with the patient and his wife at the bedside and evidence understanding  Kara Mead MD. Shade Flood. [facility] See Amion for pager  If no response to pager , please call [phone]  After 7:00 pm call Elink  [phone]    04/04/2020, 4:33 PM

## 2020-04-04 NOTE — ED Provider Notes (Signed)
[facility] EMERGENCY DEPARTMENT Provider Note   CSN: [id number] Arrival date & time: 04/04/20  1003     History Chief Complaint - shortness of breath   Richard Li is a 59 y.o. male.  The history is provided by the patient and the spouse.  Shortness of Breath Severity:  Moderate Onset quality:  Gradual Timing:  Constant Progression:  Worsening Chronicity:  Recurrent Relieved by:  Nothing Worsened by:  Activity Associated symptoms: chest pain and cough   Associated symptoms: no abdominal pain, no fever, no headaches and no vomiting   Associated symptoms comment:  Chest pain with cough  Patient with history of lung cancer not currently on chemo presents with weakness and shortness of breath.  Patient was over the past several days has had increasing generalized weakness, cough and shortness of breath.  He reports chest pain with cough.  He reports symptoms never really went away last month after hospitalization, but is worse over the past several days.      Past Medical History:  Diagnosis Date  . Allergic rhinitis, cause unspecified   . Anxiety state, unspecified   . Asthma    as a child  . Chronic airway obstruction, not elsewhere classified   . Esophageal reflux   . History of kidney stones   . Hypertension   . Lumbago   . Other and unspecified hyperlipidemia   . Other chest pain     Patient Active Problem List   Diagnosis Date Noted  . Symptomatic anemia 03/20/2020  . Volume overload 03/20/2020  . Sepsis ([place]) 03/13/2020  . SOB (shortness of breath) 03/06/2020  . COPD with acute exacerbation ([place]) 03/06/2020  . Generalized weakness 03/06/2020  . Acute hyponatremia 03/06/2020  . AKI (acute kidney injury) ([place]) 03/06/2020  . Dehydration 03/05/2020  . CAP (community acquired pneumonia) 03/05/2020  . Encounter for antineoplastic chemotherapy 01/30/2020  . Sinus congestion 01/22/2020  . Adenocarcinoma of right lung, stage 2 ([place])  01/17/2020  . Pulmonary nodules/lesions, multiple 01/07/2020  . 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 12/21/2019  . Thoracic aortic atherosclerosis ([place]) 12/21/2019  . Erectile dysfunction 11/06/2019  . Fatigue 10/16/2019  . Insomnia 09/10/2019  . BPPV (benign paroxysmal positional vertigo) 08/03/2017  . Physical exam, annual 03/30/2015  . Essential hypertension 06/03/2014  . Overweight 12/25/2012  . Kidney stone 12/25/2012  . GERD 01/27/2010  . Hyperlipidemia 12/11/2009  . GAD (generalized anxiety disorder) 11/17/2007  . Anxiety state 11/17/2007  . Allergic rhinitis 02/26/2007  . 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[place]) 02/26/2007  . ABNORMAL CHEST XRAY 02/26/2007    Past Surgical History:  Procedure Laterality Date  . APPENDECTOMY    . BRONCHIAL BIOPSY  01/07/2020   Procedure: BRONCHIAL BIOPSIES;  Surgeon: Collene Gobble, MD;  Location[facility] ENDOSCOPY;  Service: Pulmonary;;  . BRONCHIAL BRUSHINGS  01/07/2020   Procedure: BRONCHIAL BRUSHINGS;  Surgeon: Collene Gobble, MD;  Location: [facility] ENDOSCOPY;  Service: Pulmonary;;  . BRONCHIAL NEEDLE ASPIRATION BIOPSY  01/07/2020   Procedure: BRONCHIAL NEEDLE ASPIRATION BIOPSIES;  Surgeon: Collene Gobble, MD;  Location: [facility] ENDOSCOPY;  Service: Pulmonary;;  . BRONCHIAL WASHINGS  01/07/2020   Procedure: BRONCHIAL WASHINGS;  Surgeon: Collene Gobble, MD;  Location: [facility] ENDOSCOPY;  Service: Pulmonary;;  . COLONOSCOPY  15 years ago  . HEMOSTASIS CONTROL  01/07/2020   Procedure: HEMOSTASIS CONTROL;  Surgeon: Collene Gobble, MD;  Location: [facility] ENDOSCOPY;  Service: Pulmonary;;  cold saline  . NASAL  TURBINATE REDUCTION  2002   Dr.Crossley  . VASECTOMY    . VIDEO BRONCHOSCOPY WITH ENDOBRONCHIAL NAVIGATION N/A 01/07/2020   Procedure: VIDEO BRONCHOSCOPY WITH ENDOBRONCHIAL NAVIGATION;  Surgeon: Collene Gobble, MD;  Location: [facility] ENDOSCOPY;  Service: Pulmonary;  Laterality: N/A;  . VIDEO BRONCHOSCOPY WITH ENDOBRONCHIAL  ULTRASOUND N/A 01/07/2020   Procedure: VIDEO BRONCHOSCOPY WITH ENDOBRONCHIAL ULTRASOUND;  Surgeon: Collene Gobble, MD;  Location: [facility] ENDOSCOPY;  Service: Pulmonary;  Laterality: N/A;       Family History  Problem Relation Age of Onset  . Breast cancer Mother   . Cancer Father   . Heart attack Brother   . Emphysema Maternal Uncle   . COPD Maternal Uncle   . Heart disease Maternal Uncle   . Colon cancer Neg Hx     Social History   Tobacco Use  . Smoking status: Former Smoker    Packs/day: 2.00    Years: 28.00    Pack years: 56.00    Types: Cigarettes    Quit date: 04/25/2005    Years since quitting: 14.9  . Smokeless tobacco: Never Used  Vaping Use  . Vaping Use: Never used  Substance Use Topics  . Alcohol use: Yes    Alcohol/week: 12.0 standard drinks    Types: 12 Cans of beer per week    Comment: social use  . Drug use: No    Home Medications Prior to Admission medications   Medication Sig Start Date End Date Taking? Authorizing Provider  acetaminophen (TYLENOL) 500 MG tablet Take 1,000 mg by mouth every 6 (six) hours as needed for mild pain.    [provider]  dextromethorphan (DELSYM) 30 MG/5ML liquid Take 30 mg by mouth at bedtime as needed for cough.    [provider]  dextromethorphan-guaiFENesin (MUCINEX DM) 30-600 MG 12hr tablet Take 1 tablet by mouth 2 (two) times daily. 03/24/20   Little Ishikawa, MD  fenofibrate 160 MG tablet Take 1 tablet (160 mg total) by mouth daily. 04/17/19   [name], DO  folic acid (FOLVITE) 1 MG tablet Take 1 tablet (1 mg total) by mouth daily. 03/08/20   Patrecia Pour, MD  guaiFENesin (MUCINEX) 600 MG 12 hr tablet Take 600 mg by mouth daily as needed for cough.     [provider]  Ipratropium-Albuterol (COMBIVENT RESPIMAT) 20-100 MCG/ACT AERS respimat Inhale 1-2 puffs into the lungs every 6 (six) hours as needed for wheezing. 01/22/20   [name], DO  LORazepam (ATIVAN) 1 MG tablet TAKE 1  TABLET BY MOUTH THREE TIMES DAILY AS NEEDED FOR ANXIETY Patient taking differently: Take 1 mg by mouth every 8 (eight) hours as needed for anxiety.  01/27/20   [name], DO  losartan (COZAAR) 100 MG tablet Take 1 tablet (100 mg total) by mouth daily. 04/17/19   [name], DO  methylPREDNISolone (MEDROL DOSEPAK) 4 MG TBPK tablet Use as instructed. 03/28/20   Curt Bears, MD  Multiple Vitamin (MULTIVITAMIN WITH MINERALS) TABS tablet Take 1 tablet by mouth daily. 03/18/20   Mercy Riding, MD  potassium chloride SA (KLOR-CON) 20 MEQ tablet TAKE 1 TABLET BY MOUTH ONCE DAILY 03/24/20   Curt Bears, MD  prochlorperazine (COMPAZINE) 10 MG tablet TAKE 1 TABLET BY MOUTH EVERY 6 HOURS AS NEEDED FOR NAUSEA FOR VOMITING 03/26/20   Curt Bears, MD  traZODone (DESYREL) 50 MG tablet TAKE 1 TO 2 TABLETS BY MOUTH AT BEDTIME AS NEEDED FOR SLEEP Patient taking differently: Take 50 mg by  mouth at bedtime.  01/28/20   [name], DO    Allergies    Patient has no known allergies.  Review of Systems   Review of Systems  Constitutional: Positive for fatigue. Negative for fever.  Respiratory: Positive for cough and shortness of breath.   Cardiovascular: Positive for chest pain.  Gastrointestinal: Negative for abdominal pain, blood in stool and vomiting.  Genitourinary: Negative for dysuria.  Neurological: Positive for weakness and light-headedness. Negative for syncope and headaches.  All other systems reviewed and are negative.   Physical Exam Updated Vital Signs BP (!) 81/53   Pulse (!) 147   Temp (!) 97.5 F (36.4 C) (Oral)   Resp 18   SpO2 94%   Physical Exam CONSTITUTIONAL: chronically ill appearing HEAD: Normocephalic/atraumatic EYES: EOMI/PERRL ENMT: Mucous membranes dry NECK: supple no meningeal signs SPINE/BACK:entire spine nontender CV: S1/S2 noted, no loud murmurs, tachycardic LUNGS: tachypneic, crackles on the right ABDOMEN: soft, nontender, no rebound or  guarding, bowel sounds noted throughout abdomen GU:no cva tenderness NEURO: Pt is awake/alert/appropriate, moves all extremitiesx4.  No facial droop.   EXTREMITIES: pulses normal/equal, full ROM SKIN: warm, color normal PSYCH: no abnormalities of mood noted, alert and oriented to situation  ED Results / Procedures / Treatments   Labs (all labs ordered are listed, but only abnormal results are displayed) Labs Reviewed  BASIC METABOLIC PANEL - Abnormal; Notable for the following components:      Result Value   Sodium 134 (*)    Chloride 96 (*)    Glucose, Bld 133 (*)    All other components within normal limits  CBC - Abnormal; Notable for the following components:   WBC 17.1 (*)    RBC 3.22 (*)    Hemoglobin 9.5 (*)    HCT 28.8 (*)    Platelets 853 (*)    All other components within normal limits  LACTIC ACID, PLASMA - Abnormal; Notable for the following components:   Lactic Acid, Venous 3.2 (*)    All other components within normal limits  RESP PANEL BY RT-PCR (FLU A&B, COVID) ARPGX2  CULTURE, BLOOD (ROUTINE X 2)  CULTURE, BLOOD (ROUTINE X 2)  URINE CULTURE  LACTIC ACID, PLASMA  URINALYSIS, ROUTINE W REFLEX MICROSCOPIC  PROTIME-INR  APTT  TROPONIN I (HIGH SENSITIVITY)  TROPONIN I (HIGH SENSITIVITY)    EKG EKG Interpretation  Date/Time:  Saturday April 04 2020 10:06:55 EST Ventricular Rate:  145 PR Interval:  130 QRS Duration: 78 QT Interval:  274 QTC Calculation: 425 R Axis:   -117 Text Interpretation: Sinus tachycardia Right superior axis deviation Right ventricular hypertrophy Nonspecific T wave abnormality Abnormal ECG Interpretation limited secondary to artifact Confirmed by Ripley Fraise ([id number]) on 04/04/2020 10:41:18 AM   Radiology DG Chest Port 1 View  Result Date: 04/04/2020 CLINICAL DATA:  59 year old male with history of increasing shortness of breath. EXAM: PORTABLE CHEST 1 VIEW COMPARISON:  Chest x-ray 03/20/2020. FINDINGS: Significant  worsening aeration in the right lung, which may reflect worsening atelectasis and/or consolidation, most evident throughout the right mid to lower lung. Slight rightward shift of cardiomediastinal structures, indicative of substantial right-sided volume loss. Left lung appears relatively clear, with exception of several nodular densities which could represent areas of infectious consolidation, or metastatic lesions. Probable small right pleural effusion. No definite left pleural effusion. No pneumothorax. Heart size is normal. IMPRESSION: 1. Substantially worsened aeration in the right lung, which in large part appears to reflect worsening atelectasis, although there is also likely worsening  airspace disease as well. 2. The multiple left-sided pulmonary nodules are again noted, which could be infectious or neoplastic in etiology. Continued attention on follow-up studies is recommended. Electronically Signed   By: Vinnie Langton M.D.   On: 04/04/2020 10:53    Procedures .Critical Care Performed by: Ripley Fraise, MD Authorized by: Ripley Fraise, MD   Critical care provider statement:    Critical care time (minutes):  53   Critical care start time:  04/04/2020 11:07 AM   Critical care end time:  04/04/2020 12:00 PM   Critical care time was exclusive of:  Separately billable procedures and treating other patients   Critical care was necessary to treat or prevent imminent or life-threatening deterioration of the following conditions:  Sepsis and respiratory failure   Critical care was time spent personally by me on the following activities:  Discussions with consultants, development of treatment plan with patient or surrogate, evaluation of patient's response to treatment, examination of patient, review of old charts, re-evaluation of patient's condition, pulse oximetry, ordering and review of radiographic studies, ordering and review of laboratory studies and ordering and performing treatments and  interventions   I assumed direction of critical care for this patient from another provider in my specialty: no     Care discussed with: admitting provider       Medications Ordered in ED Medications  lactated ringers infusion (has no administration in time range)  vancomycin (VANCOCIN) IVPB 1000 mg/200 mL premix (has no administration in time range)  lactated ringers bolus 1,000 mL (1,000 mLs Intravenous New Bag/Given 04/04/20 1225)    And  lactated ringers bolus 1,000 mL (1,000 mLs Intravenous New Bag/Given 04/04/20 1224)    And  lactated ringers bolus 1,000 mL (1,000 mLs Intravenous New Bag/Given 04/04/20 1224)  ceFEPIme (MAXIPIME) 2 g in sodium chloride 0.9 % 100 mL IVPB (0 g Intravenous Stopped 04/04/20 1209)    ED Course  I have reviewed the triage vital signs and the nursing notes.  Pertinent labs & imaging results that were available during my care of the patient were reviewed by me and considered in my medical decision making (see chart for details).    MDM Rules/Calculators/A&P                          11:09 AM Patient w/ history with lung cancer presented with increasing cough, shortness of breath and weakness.  Patient is tachycardic, hypotensive and mildly hypoxic.  Code sepsis has been called.  IV fluids and IV antibiotics have been ordered. Patient is ill-appearing. Patient has recent history of anemia, but hemoglobin is now stable above 9 His chest x-ray is significantly worsened from prior with infiltrate as well as atelectasis and volume loss. Patient will need to be readmitted and given fluids and antibiotics.  He may also benefit from pulmonology consultation. 1:12 PM I checked on patient multiple times.  His blood pressure is improving, and SBP is now in the low 100s.  Heart rate is responding to IV fluids He is in no acute respiratory distress at this time. Discussed plan with patient and his wife.  Discussed the case with Dr. Jamse Arn for admission Would  recommend pulmonology consult inpatient as well  Final Clinical Impression(s) / ED Diagnoses Final diagnoses:  Multifocal pneumonia    Rx / DC Orders ED Discharge Orders    None       Ripley Fraise, MD 04/04/20 1313

## 2020-04-04 NOTE — Progress Notes (Signed)
elink monitoring/ tracking code sepsis

## 2020-04-04 NOTE — ED Notes (Signed)
Patient transported to X-ray 

## 2020-04-04 NOTE — ED Triage Notes (Addendum)
Patient complains of increased SOB and feeling like cant take a deep breath. Patient recently hospitalized for sepsis and reports that he is receiving cancer treatment. Recently completed treatment for lung cancer

## 2020-04-04 NOTE — Progress Notes (Signed)
Notified bedside nurse of need to draw repeat lactic acid.  Message sent to bedside RN to ensure repeat lactic will be drawn. Bedside RN replies it will be drawn shortly.

## 2020-04-04 NOTE — Progress Notes (Signed)
Pt admitted to [facility] from ED.  Pt A&O X4 and neuro intact.  All vitals within normal range with exception of HR: 105.  Pt has intermittent tachypnea in the 20's-30's. Pt is currently comfortable and not in pain.  Pt placed on telemetry and CCMD notified.  CHG bath completed.

## 2020-04-04 NOTE — H&P (Addendum)
History and Physical:    NEILAN Li   WUX:[id number] DOB: [birth year] DOA: 04/04/2020  Referring MD/provider: Dr. Christy Gentles PCP: [name], DO   Patient coming from: Home  Chief Complaint: Recurrent progressive weakness and increasing shortness of breath  History of Present Illness:   Richard Li is an 59 y.o. male with stage II NSCLC, COPD, HTN who presents with recurrent shortness of breath and progressive weakness.  This is the patient's fourth admission in the last 30 days for the same complaints.  Patient was diagnosed with lung cancer and September 2021. He was subsequently diagnosed with CAP and treated with outpatient antibiotics however was admitted on 03/05/2020 for inpatient treatment for CAP.  He was discharged 3 days later on 11/14.  Patient resumed radiation treatments however developed fevers and chills and was readmitted on 11/19 with multifocal HAP and treated with Vanco and cefepime.  During that hospitalization he was noted to have extensive patchy consolidation with groundglass opacities and a stable cavitary perihilar RUL mass.  He was discharged on 11/23.  Patient however presented back to the ED on 1126 with septic shock and fevers to 102.  Patient was readmitted for treatment of multifocal pneumonia thought to be perhaps secondary to aspiration.  He was also noted to have decompensated HFpEF and was treated with 1 dose of Lasix.  He was just discharged 11 days ago on 11/30.  Patient now presents complaining of increasing weakness and shortness of breath.  Patient states he did well for 3 to 4 days after admission however he subsequently noted loss of appetite and increasing weakness.  He also noted that his exercise tolerance was decreasing daily.  He apparently was able to walk 40 feet upon discharge but now can only walk about 20 feet.  He stopped secondary to shortness of breath.  Also notes that he does not feel like his legs are holding him up and that  he might fall.  Patient does not think he has had fevers or chills at home.  However he has a persistent cough productive of "several cups" of frothy white phlegm for the past month, unchanged despite treatment with multiple courses of antibiotics.  Patient was recently treated by his PCP with Medrol Dosepak which he completed yesterday.  ED Course:  The patient was noted to be afebrile.  However he was significantly hypotensive with initial blood pressure being 81/53 patient was treated for sepsis protocol with 3 L IV fluids and started on vancomycin and cefepime.  CXR 1 view revealed significantly worsened aeration of the right lung with likely increased infiltration as well as increased atelectasis.  Patient remained on room air with 96% O2 saturation.  ROS:   ROS   Review of Systems: Cardiovascular: Denies chest pain or palpitations GI: Denies nausea, vomiting, diarrhea or constipation GU: Denies dysuria, frequency or hematuria CNS: Denies HA, dizziness, confusion, new weakness or clumsiness.   Past Medical History:   Past Medical History:  Diagnosis Date  . Allergic rhinitis, cause unspecified   . Anxiety state, unspecified   . Asthma    as a child  . Chronic airway obstruction, not elsewhere classified   . Esophageal reflux   . History of kidney stones   . Hypertension   . Lumbago   . Other and unspecified hyperlipidemia   . Other chest pain     Past Surgical History:   Past Surgical History:  Procedure Laterality Date  . APPENDECTOMY    .  BRONCHIAL BIOPSY  01/07/2020   Procedure: BRONCHIAL BIOPSIES;  Surgeon: Collene Gobble, MD;  Location: [facility] ENDOSCOPY;  Service: Pulmonary;;  . BRONCHIAL BRUSHINGS  01/07/2020   Procedure: BRONCHIAL BRUSHINGS;  Surgeon: Collene Gobble, MD;  Location: [facility] ENDOSCOPY;  Service: Pulmonary;;  . BRONCHIAL NEEDLE ASPIRATION BIOPSY  01/07/2020   Procedure: BRONCHIAL NEEDLE ASPIRATION BIOPSIES;  Surgeon: Collene Gobble, MD;  Location: [facility]  ENDOSCOPY;  Service: Pulmonary;;  . BRONCHIAL WASHINGS  01/07/2020   Procedure: BRONCHIAL WASHINGS;  Surgeon: Collene Gobble, MD;  Location: [facility] ENDOSCOPY;  Service: Pulmonary;;  . COLONOSCOPY  15 years ago  . HEMOSTASIS CONTROL  01/07/2020   Procedure: HEMOSTASIS CONTROL;  Surgeon: Collene Gobble, MD;  Location: [facility] ENDOSCOPY;  Service: Pulmonary;;  cold saline  . NASAL TURBINATE REDUCTION  2002   Dr.Crossley  . VASECTOMY    . VIDEO BRONCHOSCOPY WITH ENDOBRONCHIAL NAVIGATION N/A 01/07/2020   Procedure: VIDEO BRONCHOSCOPY WITH ENDOBRONCHIAL NAVIGATION;  Surgeon: Collene Gobble, MD;  Location: [place] ENDOSCOPY;  Service: Pulmonary;  Laterality: N/A;  . VIDEO BRONCHOSCOPY WITH ENDOBRONCHIAL ULTRASOUND N/A 01/07/2020   Procedure: VIDEO BRONCHOSCOPY WITH ENDOBRONCHIAL ULTRASOUND;  Surgeon: Collene Gobble, MD;  Location: [facility] ENDOSCOPY;  Service: Pulmonary;  Laterality: N/A;    Social History:   Social History   Socioeconomic History  . Marital status: Married    Spouse name: Designer, industrial/product  . Number of children: Not on file  . Years of education: Not on file  . Highest education level: Not on file  Occupational History  . Occupation: truck Education administrator: [employer]  Tobacco Use  . Smoking status: Former Smoker    Packs/day: 2.00    Years: 28.00    Pack years: 56.00    Types: Cigarettes    Quit date: 04/25/2005    Years since quitting: 14.9  . Smokeless tobacco: Never Used  Vaping Use  . Vaping Use: Never used  Substance and Sexual Activity  . Alcohol use: Yes    Alcohol/week: 12.0 standard drinks    Types: 12 Cans of beer per week    Comment: social use  . Drug use: No  . Sexual activity: Not on file  Other Topics Concern  . Not on file  Social History Narrative  . Not on file   Social Determinants of Health   Financial Resource Strain: Not on file  Food Insecurity: Not on file  Transportation Needs: Not on file  Physical Activity: Not on file  Stress: Not on file   Social Connections: Not on file  Intimate Partner Violence: Not on file    Allergies   Patient has no known allergies.  Family history:   Family History  Problem Relation Age of Onset  . Breast cancer Mother   . Cancer Father   . Heart attack Brother   . Emphysema Maternal Uncle   . COPD Maternal Uncle   . Heart disease Maternal Uncle   . Colon cancer Neg Hx     Current Medications:   Prior to Admission medications   Medication Sig Start Date End Date Taking? Authorizing Provider  acetaminophen (TYLENOL) 500 MG tablet Take 1,000 mg by mouth every 6 (six) hours as needed for mild pain.    [provider]  dextromethorphan (DELSYM) 30 MG/5ML liquid Take 30 mg by mouth at bedtime as needed for cough.    [provider]  dextromethorphan-guaiFENesin (MUCINEX DM) 30-600 MG 12hr tablet Take 1 tablet by mouth  2 (two) times daily. 03/24/20   Little Ishikawa, MD  fenofibrate 160 MG tablet Take 1 tablet (160 mg total) by mouth daily. 04/17/19   [name], DO  folic acid (FOLVITE) 1 MG tablet Take 1 tablet (1 mg total) by mouth daily. 03/08/20   Patrecia Pour, MD  guaiFENesin (MUCINEX) 600 MG 12 hr tablet Take 600 mg by mouth daily as needed for cough.     [provider]  Ipratropium-Albuterol (COMBIVENT RESPIMAT) 20-100 MCG/ACT AERS respimat Inhale 1-2 puffs into the lungs every 6 (six) hours as needed for wheezing. 01/22/20   [name], DO  LORazepam (ATIVAN) 1 MG tablet TAKE 1 TABLET BY MOUTH THREE TIMES DAILY AS NEEDED FOR ANXIETY Patient taking differently: Take 1 mg by mouth every 8 (eight) hours as needed for anxiety.  01/27/20   [name], DO  losartan (COZAAR) 100 MG tablet Take 1 tablet (100 mg total) by mouth daily. 04/17/19   [name], DO  methylPREDNISolone (MEDROL DOSEPAK) 4 MG TBPK tablet Use as instructed. 03/28/20   Curt Bears, MD  Multiple Vitamin (MULTIVITAMIN WITH MINERALS) TABS tablet Take 1 tablet by mouth  daily. 03/18/20   Mercy Riding, MD  potassium chloride SA (KLOR-CON) 20 MEQ tablet TAKE 1 TABLET BY MOUTH ONCE DAILY 03/24/20   Curt Bears, MD  prochlorperazine (COMPAZINE) 10 MG tablet TAKE 1 TABLET BY MOUTH EVERY 6 HOURS AS NEEDED FOR NAUSEA FOR VOMITING 03/26/20   Curt Bears, MD  traZODone (DESYREL) 50 MG tablet TAKE 1 TO 2 TABLETS BY MOUTH AT BEDTIME AS NEEDED FOR SLEEP Patient taking differently: Take 50 mg by mouth at bedtime.  01/28/20   [name], DO    Physical Exam:   Vitals:   04/04/20 1045 04/04/20 1100 04/04/20 1130 04/04/20 1215  BP: 98/61 (!) 101/57 (!) 91/57 (!) 90/56  Pulse: (!) 128 (!) 123 (!) 121 (!) 118  Resp: 18 (!) 27 (!) 25 17  Temp:      TempSrc:      SpO2: 94% 96% 93% 93%     Physical Exam: Blood pressure (!) 90/56, pulse (!) 118, temperature (!) 97.5 F (36.4 C), temperature source Oral, resp. rate 17, SpO2 93 %. Gen: Chronically ill-appearing pale man lying in bed without tachypnea or labored breathing with attentive wife at bedside Eyes: sclera anicteric, conjuctiva mildly injected bilaterally CVS: S1-S2, regulary, no gallops Respiratory: Left lung with diminished air entry but clear.  Right lung with markedly decreased air entry throughout but areas of bronchial breath sounds, low pitched wheezes, rhonchi and an audible rub. GI: NABS, soft, NT  LE: No edema. No cyanosis Neuro: A/O x 3, Moving all extremities equally with normal strength, CN 3-12 intact, grossly nonfocal.  Psych: patient is logical and coherent, judgement and insight appear normal, mood and affect are depressed  skin: no rashes or lesions or ulcers,    Data Review:    Labs: Basic Metabolic Panel: Recent Labs  Lab 04/04/20 1040  NA 134*  K 3.7  CL 96*  CO2 25  GLUCOSE 133*  BUN 19  CREATININE 1.16  CALCIUM 9.3   Liver Function Tests: No results for input(s): AST, ALT, ALKPHOS, BILITOT, PROT, ALBUMIN in the last 168 hours. No results for input(s): LIPASE,  AMYLASE in the last 168 hours. No results for input(s): AMMONIA in the last 168 hours. CBC: Recent Labs  Lab 04/04/20 1040  WBC 17.1*  HGB 9.5*  HCT 28.8*  MCV 89.4  PLT  853*   Cardiac Enzymes: No results for input(s): CKTOTAL, CKMB, CKMBINDEX, TROPONINI in the last 168 hours.  BNP (last 3 results) No results for input(s): PROBNP in the last 8760 hours. CBG: No results for input(s): GLUCAP in the last 168 hours.  Urinalysis    Component Value Date/Time   COLORURINE YELLOW 03/20/2020 1435   APPEARANCEUR CLEAR 03/20/2020 1435   LABSPEC 1.005 03/20/2020 1435   PHURINE 8.0 03/20/2020 1435   GLUCOSEU NEGATIVE 03/20/2020 1435   GLUCOSEU NEGATIVE 12/12/2011 0944   HGBUR NEGATIVE 03/20/2020 1435   BILIRUBINUR NEGATIVE 03/20/2020 1435   KETONESUR NEGATIVE 03/20/2020 1435   PROTEINUR NEGATIVE 03/20/2020 1435   UROBILINOGEN 0.2 12/12/2011 0944   NITRITE NEGATIVE 03/20/2020 1435   LEUKOCYTESUR NEGATIVE 03/20/2020 1435      Radiographic Studies: DG Chest Port 1 View  Result Date: 04/04/2020 CLINICAL DATA:  59 year old male with history of increasing shortness of breath. EXAM: PORTABLE CHEST 1 VIEW COMPARISON:  Chest x-ray 03/20/2020. FINDINGS: Significant worsening aeration in the right lung, which may reflect worsening atelectasis and/or consolidation, most evident throughout the right mid to lower lung. Slight rightward shift of cardiomediastinal structures, indicative of substantial right-sided volume loss. Left lung appears relatively clear, with exception of several nodular densities which could represent areas of infectious consolidation, or metastatic lesions. Probable small right pleural effusion. No definite left pleural effusion. No pneumothorax. Heart size is normal. IMPRESSION: 1. Substantially worsened aeration in the right lung, which in large part appears to reflect worsening atelectasis, although there is also likely worsening airspace disease as well. 2. The multiple  left-sided pulmonary nodules are again noted, which could be infectious or neoplastic in etiology. Continued attention on follow-up studies is recommended. Electronically Signed   By: Vinnie Langton M.D.   On: 04/04/2020 10:53    EKG: Independently reviewed.  Sinus tachycardia at 145.  Axis is being read as rightward at 243, markedly different from previous normal axis.  No acute ST-T wave changes.   Assessment/Plan:   Principal Problem:   Sepsis due to pneumonia [facility]) Active Problems:   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HCC)   GERD   Essential hypertension   Adenocarcinoma of right lung, stage 2 (HCC)   Multifocal pneumonia   Generalized weakness  59 year old with stage II NSCLCa is readmitted for the fourth time in 1 month with multifocal pneumonia and weakness.   Recurrent Sepsis secondary to Multifocal Pneumonia Patient does meet sepsis criteria although I suspect that tachycardia and hypotension may well be secondary to decreased p.o. intake.  He has responded well to IV fluids and antibiotics with marked improvement in his blood pressure. Agree with continuing cefepime and vancomycin for now. Have discussed patient with pulmonary consultation, they will come see him for further imaging and/or bronchoscopy as warranted.  AKI Likely secondary to hypotension, will recheck in the morning after treatment of hypotension and infection  New rightward axis on EKG Unclear etiology as patient had normal axis last week along with echocardiogram with normal RV size and function. Will repeat EKG now Low threshold for CT angiogram to rule out PE if repeat EKG shows rightward axis again.   Patient however is not hypoxic although he is quite tachycardic   Decreased p.o. intake and FTT Nutrition consult requested  HFpEF Grade 1 Diastolic dysfunction No evidence for decompensation at present  HTN Hold losartan  Anxiety and depression Continue  lorazepam and trazodone  Hughson/Bynum Anemia Improved on folic acid. Continue folic acid  Patient is partial code.  He does agree to intubation/NIV and aggressive treatment in case of respiratory arrest.  He does not want to have CPR in the event of cardiac arrest.   Other information:   DVT prophylaxis: Lovenox ordered. Code Status: Partial code as noted above Family Communication: Patient wife was at bedside throughout Disposition Plan: [facility] called: Pulmonary Admission status: Inpatient  Emberlyn Burlison Tublu Andrena Margerum [facility]  If 7PM-7AM, please contact night-coverage [email or web address] Password [email or web address] 04/04/2020, 1:08 PM

## 2020-04-04 NOTE — Progress Notes (Signed)
Pharmacy Antibiotic Note  Richard Li is a 59 y.o. male admitted on 04/04/2020 with shortness of breath concerning for infection of unknown source.  Pharmacy has been consulted for Cefepime and vancomycin dosing.  WBC elevated at 17.1, LA 3.2, Hypothermic, SCr 1.16. Of note, patient has received one dose of vancomycin 1 gm and Cefepime 2 gm in the ED   Plan: -Give additional vancomycin 1 gm IV now for total loading dose of 2 gm and then start vancomycin 1 gm IV Q 12 hours -Cefepime 2 gm IV Q 8 hours -Monitor CBC, renal fx, cultures and clinical progress -VT at SS      Temp (24hrs), Avg:97.5 F (36.4 C), Min:97.5 F (36.4 C), Max:97.5 F (36.4 C)  Recent Labs  Lab 04/04/20 1040  WBC 17.1*  CREATININE 1.16    Estimated Creatinine Clearance: 78.1 mL/min (by C-G formula based on SCr of 1.16 mg/dL).    No Known Allergies  Antimicrobials this admission: Cefepime 12/11 >>  Vancomycin 12/11 >>   Dose adjustments this admission:   Microbiology results: 12/11 BCx:  12/11 UCx:     Thank you for allowing pharmacy to be a part of this patient's care.  Albertina Parr, PharmD., BCPS, BCCCP Clinical Pharmacist Please refer to [facility] for unit-specific pharmacist

## 2020-04-05 ENCOUNTER — Encounter (HOSPITAL_COMMUNITY): Payer: Self-pay | Admitting: Pulmonary Disease

## 2020-04-05 ENCOUNTER — Encounter (HOSPITAL_COMMUNITY)
Admission: EM | Disposition: A | Discharge status: Home / Self Care | Payer: Self-pay | Source: Home / Self Care | Attending: Internal Medicine

## 2020-04-05 ENCOUNTER — Inpatient Hospital Stay (HOSPITAL_COMMUNITY): Payer: BC Managed Care – PPO

## 2020-04-05 DIAGNOSIS — R531 Weakness: Secondary | ICD-10-CM

## 2020-04-05 DIAGNOSIS — I10 Essential (primary) hypertension: Secondary | ICD-10-CM

## 2020-04-05 HISTORY — PX: BRONCHIAL WASHINGS: SHX5105

## 2020-04-05 HISTORY — PX: VIDEO BRONCHOSCOPY: SHX5072

## 2020-04-05 HISTORY — PX: BRONCHIAL BIOPSY: SHX5109

## 2020-04-05 LAB — IRON AND TIBC
Iron: 21 ug/dL — ABNORMAL LOW (ref 45–182)
Saturation Ratios: 21 % (ref 17.9–39.5)
TIBC: 98 ug/dL — ABNORMAL LOW (ref 250–450)
UIBC: 77 ug/dL

## 2020-04-05 LAB — CBC
HCT: 20.8 % — ABNORMAL LOW (ref 39.0–52.0)
Hemoglobin: 7 g/dL — ABNORMAL LOW (ref 13.0–17.0)
MCH: 29.4 pg (ref 26.0–34.0)
MCHC: 33.7 g/dL (ref 30.0–36.0)
MCV: 87.4 fL (ref 80.0–100.0)
Platelets: 480 10*3/uL — ABNORMAL HIGH (ref 150–400)
RBC: 2.38 MIL/uL — ABNORMAL LOW (ref 4.22–5.81)
RDW: 13.7 % (ref 11.5–15.5)
WBC: 9.9 10*3/uL (ref 4.0–10.5)
nRBC: 0 % (ref 0.0–0.2)

## 2020-04-05 LAB — FERRITIN: Ferritin: 853 ng/mL — ABNORMAL HIGH (ref 24–336)

## 2020-04-05 LAB — VITAMIN B12: Vitamin B-12: 225 pg/mL (ref 180–914)

## 2020-04-05 LAB — BASIC METABOLIC PANEL
Anion gap: 12 (ref 5–15)
Anion gap: 12 (ref 5–15)
BUN: 15 mg/dL (ref 6–20)
BUN: 12 mg/dL (ref 6–20)
CO2: 20 mmol/L — ABNORMAL LOW (ref 22–32)
CO2: 22 mmol/L (ref 22–32)
Calcium: 8.4 mg/dL — ABNORMAL LOW (ref 8.9–10.3)
Calcium: 8.2 mg/dL — ABNORMAL LOW (ref 8.9–10.3)
Chloride: 101 mmol/L (ref 98–111)
Chloride: 102 mmol/L (ref 98–111)
Creatinine, Ser: 0.7 mg/dL (ref 0.61–1.24)
Creatinine, Ser: 0.57 mg/dL — ABNORMAL LOW (ref 0.61–1.24)
GFR, Estimated: >60 mL/min (ref >60)
GFR, Estimated: >60 mL/min (ref >60)
Glucose, Bld: 138 mg/dL — ABNORMAL HIGH (ref 70–99)
Glucose, Bld: 98 mg/dL (ref 70–99)
Potassium: 5.4 mmol/L — ABNORMAL HIGH (ref 3.5–5.1)
Potassium: 4.1 mmol/L (ref 3.5–5.1)
Sodium: 133 mmol/L — ABNORMAL LOW (ref 135–145)
Sodium: 136 mmol/L (ref 135–145)

## 2020-04-05 LAB — PREPARE RBC (CROSSMATCH)

## 2020-04-05 LAB — FOLATE: Folate: 10.4 ng/mL (ref >5.9)

## 2020-04-05 LAB — HEMOGLOBIN AND HEMATOCRIT, BLOOD
HCT: 28.4 % — ABNORMAL LOW (ref 39.0–52.0)
Hemoglobin: 9.2 g/dL — ABNORMAL LOW (ref 13.0–17.0)

## 2020-04-05 LAB — MRSA PCR SCREENING: MRSA by PCR: NEGATIVE

## 2020-04-05 IMAGING — DX DG CHEST 1V PORT
2 series · 3 of 3 positions shown · non-contrast
Comparison: Chest x-ray [DATE].

CLINICAL DATA: 59-year-old male with history of bronchoscopy and
biopsy.

EXAM:
PORTABLE CHEST 1 VIEW

[Series 1: chest ap · 0.14mm/px · 2 of 2 slices shown (1 of 2)]
[im 1/2]
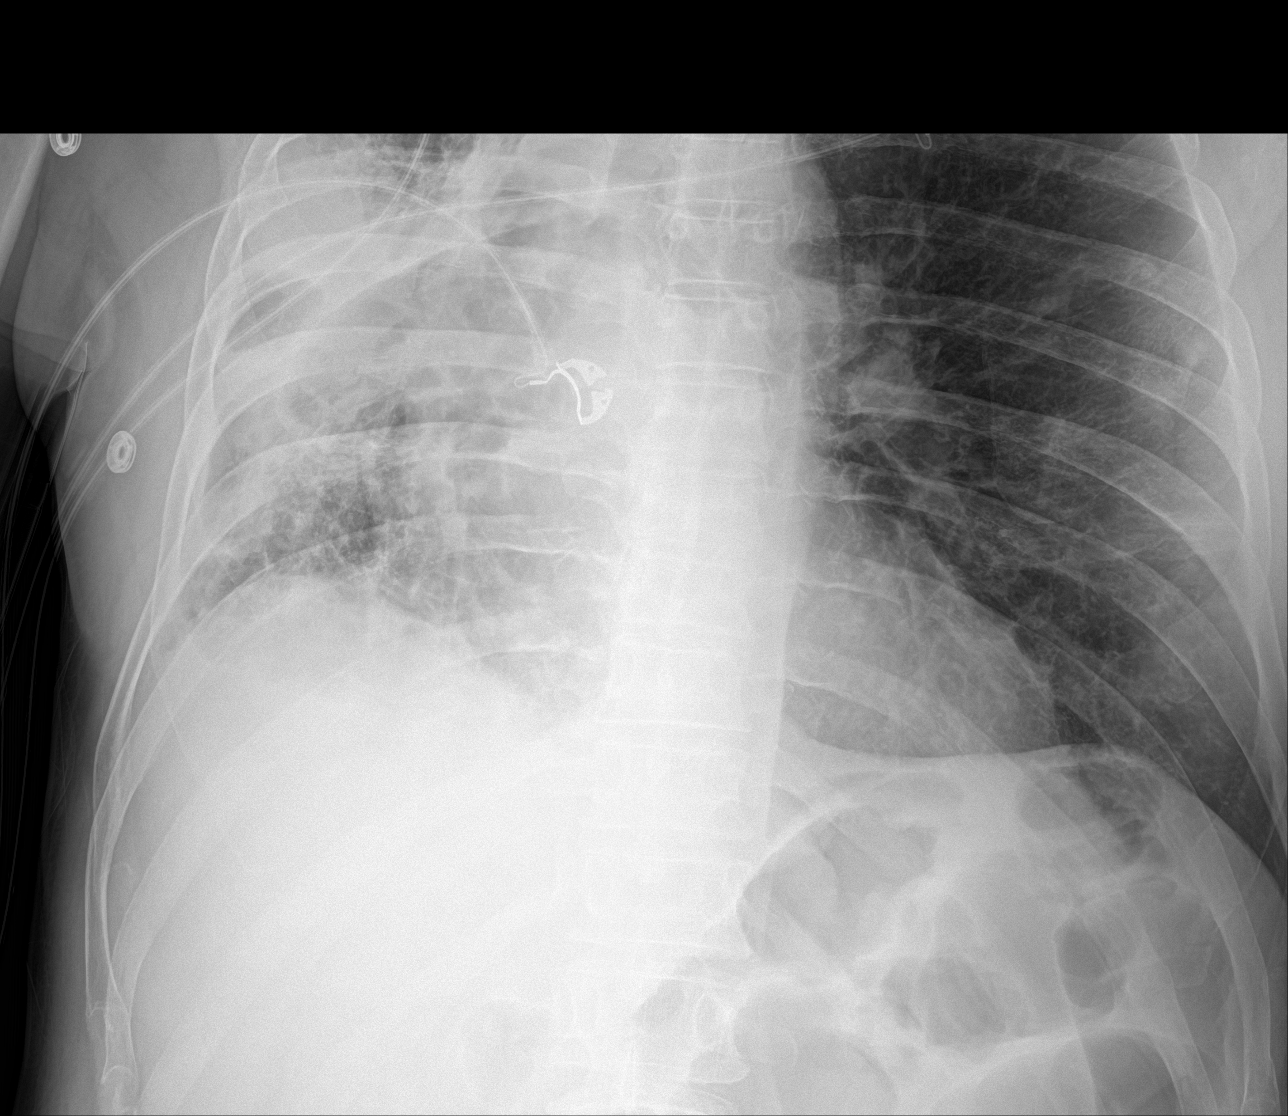
[im 2/2]
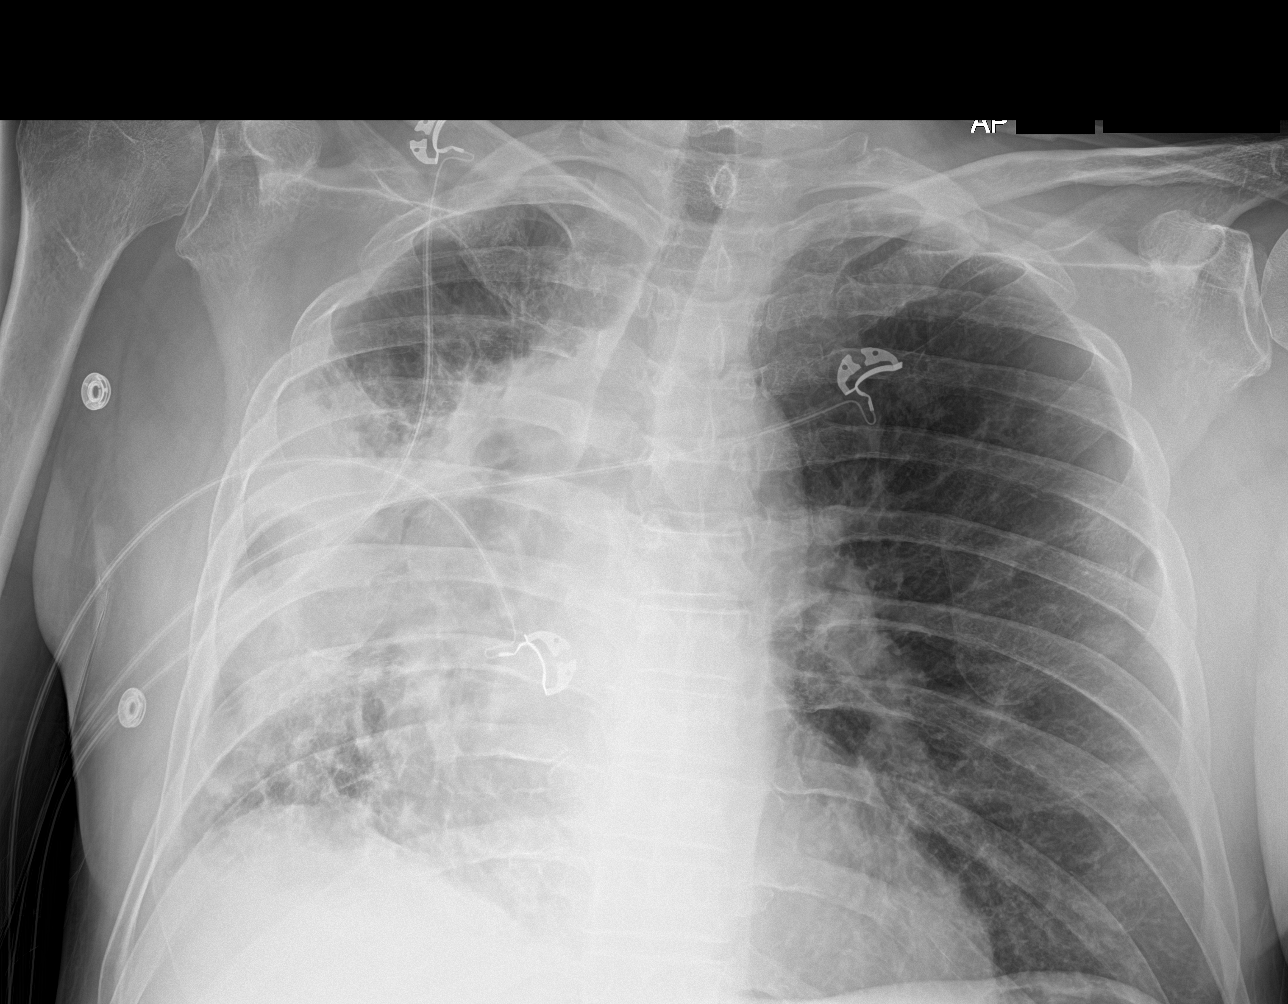

[chest ap (2 of 2)]
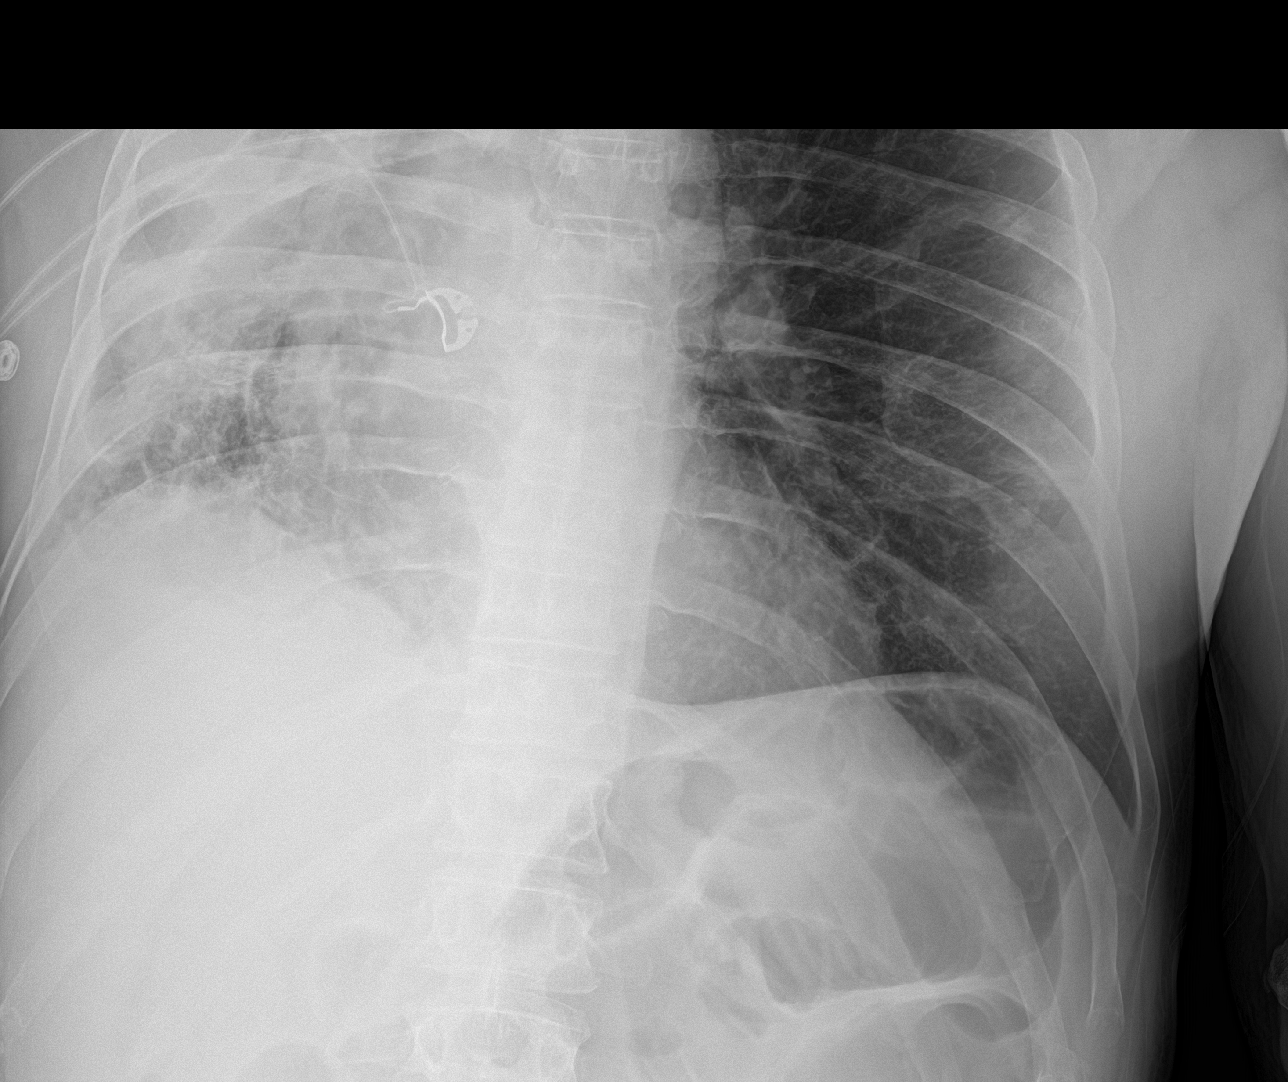

[3 of 3 positions shown; findings below may reference images not displayed]

FINDINGS: Irregular opacities are again noted throughout the right mid to
lower lung, with multiple nodular densities again noted in the left
lung, similar to prior examinations. No definite pleural effusions.
No evidence of pulmonary edema. Heart size is mildly enlarged.
Mediastinal contours are distorted.
IMPRESSION: 1. The overall appearance the chest is very similar to prior
examinations, with presumed atelectasis and consolidation throughout
the right mid to lower lung, and multiple metastatic nodules in the
left lung. No new acute findings.

## 2020-04-05 SURGERY — BRONCHOSCOPY, WITH FLUOROSCOPY
Anesthesia: Moderate Sedation | Laterality: Right

## 2020-04-05 MED ORDER — BUTAMBEN-TETRACAINE-BENZOCAINE 2-2-14 % EX AERO
1.0000 | INHALATION_SPRAY | Freq: Once | CUTANEOUS | Status: DC
  Filled 2020-04-05: qty 20

## 2020-04-05 MED ORDER — LIDOCAINE HCL URETHRAL/MUCOSAL 2 % EX GEL
1.0000 "application " | Freq: Once | CUTANEOUS | Status: AC
  Administered 2020-04-05: 1 via TOPICAL
  Filled 2020-04-05: qty 11

## 2020-04-05 MED ORDER — SODIUM CHLORIDE 0.9% IV SOLUTION: Freq: Once | INTRAVENOUS | Status: AC

## 2020-04-05 MED ORDER — LIDOCAINE HCL (PF) 1 % IJ SOLN
INTRAMUSCULAR | Status: AC
  Filled 2020-04-05: qty 30

## 2020-04-05 MED ORDER — LIDOCAINE HCL (PF) 1 % IJ SOLN
INTRAMUSCULAR | Status: DC | PRN
  Administered 2020-04-05: 2 mL
  Administered 2020-04-05: 4 mL
  Administered 2020-04-05 (×2): 2 mL

## 2020-04-05 MED ORDER — LIDOCAINE HCL URETHRAL/MUCOSAL 2 % EX GEL
CUTANEOUS | Status: AC
  Filled 2020-04-05: qty 11

## 2020-04-05 MED ORDER — PHENYLEPHRINE HCL 0.25 % NA SOLN
1.0000 | Freq: Four times a day (QID) | NASAL | Status: DC | PRN
  Administered 2020-04-05: 1 via NASAL
  Filled 2020-04-05: qty 15

## 2020-04-05 MED ORDER — FENTANYL CITRATE (PF) 100 MCG/2ML IJ SOLN
INTRAMUSCULAR | Status: DC | PRN
  Administered 2020-04-05 (×3): 50 ug via INTRAVENOUS

## 2020-04-05 MED ORDER — CYANOCOBALAMIN 1000 MCG/ML IJ SOLN
1000.0000 ug | Freq: Once | INTRAMUSCULAR | Status: AC
  Administered 2020-04-05: 1000 ug via SUBCUTANEOUS
  Filled 2020-04-05: qty 1

## 2020-04-05 MED ORDER — MIDAZOLAM HCL (PF) 10 MG/2ML IJ SOLN
INTRAMUSCULAR | Status: DC | PRN
  Administered 2020-04-05 (×4): 1 mg via INTRAVENOUS

## 2020-04-05 MED ORDER — PHENYLEPHRINE HCL 0.25 % NA SOLN
NASAL | Status: AC
  Filled 2020-04-05: qty 15

## 2020-04-05 MED ORDER — VITAMIN B-12 1000 MCG PO TABS
1000.0000 ug | ORAL_TABLET | Freq: Every day | ORAL | Status: DC
  Administered 2020-04-06 – 2020-04-07 (×2): 1000 ug via ORAL
  Filled 2020-04-05 (×2): qty 1

## 2020-04-05 MED ORDER — FENTANYL CITRATE (PF) 100 MCG/2ML IJ SOLN
INTRAMUSCULAR | Status: AC
  Filled 2020-04-05: qty 4

## 2020-04-05 MED ORDER — MIDAZOLAM HCL (PF) 5 MG/ML IJ SOLN
INTRAMUSCULAR | Status: AC
  Filled 2020-04-05: qty 2

## 2020-04-05 NOTE — Evaluation (Signed)
Physical Therapy Evaluation Only Patient Details Name: Richard Li MRN: [id number] DOB: [birth year] Today's Date: 04/05/2020   History of Present Illness  Pt is a 59 year old male who presented with recurrent shortness of breath and progressive weakness. In the past 30 days, this is the pt's 4th admission for the same issues. He was diagnosed with stage 2 lung adenocancer in September of 2021. He has been receiving chemoRT. He has had a persistent productive cough of frothy white phlegm for the past month. He was admitted this time with sepsis due to pneumonia. Pt received bronchoscopy 04/05/20 with no new acute findings. PMH: lumbago, HTN, chronic airway obstruction, asthma, and anxiety.  Clinical Impression  Pt presents to hospital for 4th time in past 30 days with similar issues. He reports he finished radiation ~2 weeks ago for the time being. He is independent without use of AD/AE at baseline and able to get almost 24/7 supervision/assistance as needed from various family members. Pt demonstrates some mild generalized weakness and endurance deficits, most likely due to the radiation, cancer, and repeated bouts of recent pneumonia. This will likely improve as his health improves. Pt does report a fear of falling, but displays good balance in Rhomberg and tandem stance with appropriate reactional strategies to maintain his balance. Pt not interested in pursuing PT services to address his generalized weakness and fear of falling at this time. Therefore, due to this reason and that he most likely is at/near his baseline level of function and will improve naturally as his health improves no further PT services are necessary at this time. Educated pt and his wife on energy conservation but also pushing himself appropriately to increase his strength and endurance. All education provided with no further questions. Signing off.    Follow Up Recommendations No PT follow up    Equipment Recommendations   None recommended by PT    Recommendations for Other Services       Precautions / Restrictions Precautions Precautions: Fall Precaution Comments: monitor SpO2 Restrictions Weight Bearing Restrictions: No      Mobility  Bed Mobility Overal bed mobility: Modified Independent             General bed mobility comments: HOB elevated and use of bed rails to transition supine <> sit safely.    Transfers Overall transfer level: Independent Equipment used: None             General transfer comment: Sit to stand from EOB at simulated home bed height without LOB.  Ambulation/Gait Ambulation/Gait assistance: Min guard Gait Distance (Feet): 30 Feet Assistive device: None Gait Pattern/deviations: Step-through pattern;Decreased stride length;Decreased dorsiflexion - right;Decreased dorsiflexion - left Gait velocity: reduced Gait velocity interpretation: 1.31 - 2.62 ft/sec, indicative of limited community ambulator General Gait Details: Ambulates with decreased feet clearance and step length bilat, but no toe drag or LOB. Mild trunk sway noted, but assumed recent baseline. Min guard for safety.  Stairs            Wheelchair Mobility    Modified Rankin (Stroke Patients Only)       Balance Overall balance assessment: Needs assistance Sitting-balance support: No upper extremity supported;Feet supported Sitting balance-Leahy Scale: Good Sitting balance - Comments: Static sitting EOB no LOB or trunk sway, supervision for safety.   Standing balance support: No upper extremity supported Standing balance-Leahy Scale: Good Standing balance comment: Static standing with no overt LOB, mild trunk sway increased with narrow BOS positioning, such as Rhomberg or tandem  stance, esp with eyes closed. Min guard for safety but no LOB.     Tandem Stance - Right Leg: 10 (seconds eyes open, mild trunk sway increased, no LOB)   Rhomberg - Eyes Opened: 10 (seconds, min increase in  trunk sway, no LOB) Rhomberg - Eyes Closed: 10 (seconds, mild trunk sway increased, no LOB)                 Pertinent Vitals/Pain Pain Assessment: No/denies pain Pain Intervention(s): Limited activity within patient's tolerance;Monitored during session;Repositioned    Home Living Family/patient expects to be discharged to:: Private residence Living Arrangements: Spouse/significant other Available Help at Discharge: Family;Available PRN/intermittently (from wife and children, almost 24/7 through taking turns when not working) Type of Home: House Home Access: Stairs to enter Entrance Stairs-Rails: Can reach both Entrance Stairs-Number of Steps: 5 Home Layout: One level Home Equipment: Other (comment);Hand held shower head (bed with controls but does not change level of height off ground)      Prior Function Level of Independence: Independent         Comments: Pt independent with ADLs, 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     Hand Dominance   Dominant Hand: Right    Extremity/Trunk Assessment   Upper Extremity Assessment Upper Extremity Assessment: Defer to OT evaluation    Lower Extremity Assessment Lower Extremity Assessment: RLE deficits/detail;LLE deficits/detail RLE Deficits / Details: MMT scores of 4- to 4 grossly throughout RLE Sensation: WNL RLE Coordination:  (possible dysdiadochokinesia noted) LLE Deficits / Details: MMT scores of 4- to 4 grossly throughout LLE Sensation: WNL LLE Coordination: WNL    Cervical / Trunk Assessment Cervical / Trunk Assessment: Normal  Communication   Communication: No difficulties  Cognition Arousal/Alertness: Awake/alert Behavior During Therapy: WFL for tasks assessed/performed Overall Cognitive Status: Within Functional Limits for tasks assessed                                 General Comments: A&Ox4.      General Comments General comments (skin integrity, edema, etc.): SpO2 >/= 94%  throughout    Exercises     Assessment/Plan    PT Assessment Patent does not need any further PT services  PT Problem List         PT Treatment Interventions      PT Goals (Current goals can be found in the Care Plan section)  Acute Rehab PT Goals Patient Stated Goal: to get stronger and increase his activity tolerance PT Goal Formulation: With patient/family Time For Goal Achievement: 04/06/20 Potential to Achieve Goals: Good    Frequency     Barriers to discharge        Co-evaluation               AM-PAC PT "6 Clicks" Mobility  Outcome Measure Help needed turning from your back to your side while in a flat bed without using bedrails?: None Help needed moving from lying on your back to sitting on the side of a flat bed without using bedrails?: None Help needed moving to and from a bed to a chair (including a wheelchair)?: None Help needed standing up from a chair using your arms (e.g., wheelchair or bedside chair)?: None Help needed to walk in hospital room?: None Help needed climbing 3-5 steps with a railing? : None 6 Click Score: 24    End of Session Equipment Utilized During Treatment: Gait belt Activity Tolerance:  Patient tolerated treatment well Patient left: in bed;with call bell/phone within reach;with bed alarm set;with family/visitor present Nurse Communication: Mobility status PT Visit Diagnosis: Unsteadiness on feet (R26.81);Other abnormalities of gait and mobility (R26.89);Muscle weakness (generalized) (M62.81)    Time: 1733-1800 PT Time Calculation (min) (ACUTE ONLY): 27 min   Charges:   PT Evaluation $PT Eval Low Complexity: 1 Low PT Treatments $Therapeutic Activity: 8-22 mins        Moishe Spice, PT, DPT Acute Rehabilitation Services  Pager: [phone] Office: [phone]   Orvan Falconer 04/05/2020, 6:11 PM

## 2020-04-05 NOTE — Progress Notes (Signed)
PROGRESS NOTE  JEREME LOREN CVE:[id number] DOB: [birth year] DOA: 04/04/2020 PCP: [name], DO  HPI/Recap of past 24 hours: HPI from Dr Nash Mantis is an 59 y.o. male with stage II NSCLC, COPD, HTN who presents with recurrent shortness of breath and progressive weakness.  This is the patient's fourth admission in the last 30 days for the same complaints. Patient was last discharged on 03/24/2020 after being managed for septic shock likely 2/2 multifocal pneumonia/aspiration pneumonia, was also noted to have decompensated HFpEF. Patient now presents complaining of increasing weakness, shortness of breath, persistent productive cough of frothy white phlegm for the past month.  In the ED, noted to be afebrile, chest x-ray showed worsened aeration of the right lung likely due to increased infiltrates, saturating well on room air, hypotensive, received 3 L of IV fluid, started on vancomycin and cefepime.  PCCM consulted.  Patient admitted for further management.    Today, patient still reports coughing, still productive, denies any chest pain, abdominal pain, nausea/vomiting, fever/chills.  Still reports generalized weakness, still short of breath.  Plan for bronchoscopy 04/05/20    Assessment/Plan: Principal Problem:   Sepsis due to pneumonia [facility]) Active Problems:   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HCC)   GERD   Essential hypertension   Adenocarcinoma of right lung, stage 2 (HCC)   Multifocal pneumonia   Generalized weakness   Sepsis secondary to ??recurrent Multifocal Pneumonia Currently afebrile, with no leukocytosis, saturating well on room air Patient does meet sepsis criteria, leukocytosis, tachycardia, and tachypnea  BC x2 pending UA negative Chest x-ray showed substantially worsened aeration in the right lung, which in large part appears to reflect worsening atelectasis, although there is also likely worsening airspace  disease as well PCCM consulted, s/p bronchoscopy on 04/05/20, respiratory culture, BAL samples taken pending.  Recommend Solu-Medrol for possible radiation pneumonitis Continue cefepime, vancomycin for now pending culture results Continue Solu-Medrol Supplemental O2, I-S Monitor closely  Normocytic anemia/anemia of chronic disease Vitamin B12 deficiency Baseline hemoglobin around 8 Transfuse 1 unit of PRBC on 02/58/5277 Vitamin O24, folic acid supplementation Daily CBC  Chronic diastolic HF Appears euvolemic Echo done on 02/2020 showed EF of 60 to 65%, no regional wall motion abnormalities, grade 1 diastolic dysfunction Monitor closely  Hypertension BP stable Hold home losartan  Stage II lung adenocarcinoma Status post chemoradiation Continue outpatient follow-up with Dr. Earlie Server   Adult failure to thrive/deconditioned Nutrition consult requested PT/OT  Anxiety and depression Continue lorazepam and trazodone       Malnutrition Type:      Malnutrition Characteristics:      Nutrition Interventions:       Estimated body mass index is 24.21 kg/m as calculated from the following:   Height as of this encounter: _0  (1.778 m).   Weight as of this encounter: 76.5 kg.     Code Status: Partial  Family Communication: Discussed with patient  Disposition Plan: Status is: Inpatient  Remains inpatient appropriate because:Inpatient level of care appropriate due to severity of illness   Dispo: The patient is from: Home              Anticipated d/c is to: Home              Anticipated d/c date is: 2 days              Patient currently is not medically stable to d/c.    Consultants:  PCCM  Procedures:  Bronchoscopy  on 04/05/2020  Antimicrobials:  Cefepime  Vancomycin  DVT prophylaxis: Lovenox   Objective: Vitals:   04/05/20 1226 04/05/20 1242 04/05/20 1257 04/05/20 1417  BP: 122/61 139/72 123/68 127/68  Pulse: 89 100 93 (!) 112  Resp:  _0 Temp:  97.7 F (36.5 C) 98.3 F (36.8 C) 97.8 F (36.6 C)  TempSrc:  Oral Oral Oral  SpO2: 94% 94% 93% 93%  Weight:      Height:        Intake/Output Summary (Last 24 hours) at 04/05/2020 1425 Last data filed at 04/05/2020 0530 Gross per 24 hour  Intake 1970.75 ml  Output 1250 ml  Net 720.75 ml   Filed Weights   04/04/20 1536 04/04/20 1736  Weight: 89.8 kg 76.5 kg    Exam:  General: NAD, weak, deconditioned  Cardiovascular: S1, S2 present  Respiratory:  Coarse breath sounds on the right  Abdomen: Soft, nontender, nondistended, bowel sounds present  Musculoskeletal: No bilateral pedal edema noted  Skin: Normal  Psychiatry: Normal mood   Data Reviewed: CBC: Recent Labs  Lab 04/04/20 1040 04/04/20 1913 04/05/20 0218  WBC 17.1* 14.3* 9.9  HGB 9.5* 8.2* 7.0*  HCT 28.8* 25.4* 20.8*  MCV 89.4 88.5 87.4  PLT 853* 608* 956*   Basic Metabolic Panel: Recent Labs  Lab 04/04/20 1040 04/04/20 1913 04/05/20 0218 04/05/20 0741  NA 134*  --  133* 136  K 3.7  --  5.4* 4.1  CL 96*  --  101 102  CO2 25  --  20* 22  GLUCOSE 133*  --  138* 98  BUN 19  --  15 12  CREATININE 1.16 0.84 0.70 0.57*  CALCIUM 9.3  --  8.4* 8.2*   GFR: Estimated Creatinine Clearance: 102.7 mL/min (A) (by C-G formula based on SCr of 0.57 mg/dL (L)). Liver Function Tests: No results for input(s): AST, ALT, ALKPHOS, BILITOT, PROT, ALBUMIN in the last 168 hours. No results for input(s): LIPASE, AMYLASE in the last 168 hours. No results for input(s): AMMONIA in the last 168 hours. Coagulation Profile: Recent Labs  Lab 04/04/20 1254  INR 1.2   Cardiac Enzymes: No results for input(s): CKTOTAL, CKMB, CKMBINDEX, TROPONINI in the last 168 hours. BNP (last 3 results) No results for input(s): PROBNP in the last 8760 hours. HbA1C: No results for input(s): HGBA1C in the last 72 hours. CBG: No results for input(s): GLUCAP in the last 168 hours. Lipid Profile: No results for  input(s): CHOL, HDL, LDLCALC, TRIG, CHOLHDL, LDLDIRECT in the last 72 hours. Thyroid Function Tests: No results for input(s): TSH, T4TOTAL, FREET4, T3FREE, THYROIDAB in the last 72 hours. Anemia Panel: Recent Labs    04/05/20 0741  VITAMINB12 225  FOLATE 10.4  FERRITIN 853*  TIBC 98*  IRON 21*   Urine analysis:    Component Value Date/Time   COLORURINE YELLOW 04/04/2020 2217   APPEARANCEUR CLEAR 04/04/2020 2217   LABSPEC 1.006 04/04/2020 2217   PHURINE 6.0 04/04/2020 2217   GLUCOSEU NEGATIVE 04/04/2020 2217   GLUCOSEU NEGATIVE 12/12/2011 0944   HGBUR NEGATIVE 04/04/2020 2217   [place] NEGATIVE 04/04/2020 2217   [place] NEGATIVE 04/04/2020 2217   PROTEINUR NEGATIVE 04/04/2020 2217   UROBILINOGEN 0.2 12/12/2011 0944   NITRITE NEGATIVE 04/04/2020 2217   LEUKOCYTESUR NEGATIVE 04/04/2020 2217   Sepsis Labs: _1 (procalcitonin:4,lacticidven:4)  ) Recent Results (from the past 240 hour(s))  Blood Culture (routine x 2)     Status: None (Preliminary result)   Collection Time: 04/04/20  10:56 AM   Specimen: BLOOD  Result Value Ref Range Status   Specimen Description BLOOD BLOOD RIGHT HAND  Final   Special Requests   Final    BOTTLES DRAWN AEROBIC AND ANAEROBIC Blood Culture adequate volume   Culture   Final    NO GROWTH < 24 HOURS Performed at [facility], [address], [place], [place] [address]    Report Status PENDING  Incomplete  Blood Culture (routine x 2)     Status: None (Preliminary result)   Collection Time: 04/04/20 11:12 AM   Specimen: BLOOD  Result Value Ref Range Status   Specimen Description BLOOD RIGHT ANTECUBITAL  Final   Special Requests   Final    BOTTLES DRAWN AEROBIC AND ANAEROBIC Blood Culture adequate volume   Culture   Final    NO GROWTH < 24 HOURS Performed at [facility], [place] [address], [place], [place] [address]    Report Status PENDING  Incomplete  Resp Panel by RT-PCR (Flu A&B, Covid) Nasopharyngeal Swab      Status: None   Collection Time: 04/04/20 11:13 AM   Specimen: Nasopharyngeal Swab; Nasopharyngeal(NP) swabs in vial transport medium  Result Value Ref Range Status   SARS Coronavirus 2 by RT PCR NEGATIVE NEGATIVE Final    Comment: (NOTE) SARS-CoV-2 target nucleic acids are NOT DETECTED.  The SARS-CoV-2 RNA is generally detectable in upper respiratory specimens during the acute phase of infection. The lowest concentration of SARS-CoV-2 viral copies this assay can detect is 138 copies/mL. A negative result does not preclude SARS-Cov-2 infection and should not be used as the sole basis for treatment or other patient management decisions. A negative result may occur with  improper specimen collection/handling, submission of specimen other than nasopharyngeal swab, presence of viral mutation(s) within the areas targeted by this assay, and inadequate number of viral copies(<138 copies/mL). A negative result must be combined with clinical observations, patient history, and epidemiological information. The expected result is Negative.  Fact Sheet for Patients:  EntrepreneurPulse.com.au  Fact Sheet for Healthcare Providers:  IncredibleEmployment.be  This test is no t yet approved or cleared by the [place] FDA and  has been authorized for detection and/or diagnosis of SARS-CoV-2 by FDA under an Emergency Use Authorization (EUA). This EUA will remain  in effect (meaning this test can be used) for the duration of the COVID-19 declaration under Section 564(b)(1) of the Act, 21 U.S.C.section 360bbb-3(b)(1), unless the authorization is terminated  or revoked sooner.       Influenza A by PCR NEGATIVE NEGATIVE Final   Influenza B by PCR NEGATIVE NEGATIVE Final    Comment: (NOTE) The Xpert Xpress SARS-CoV-2/FLU/RSV plus assay is intended as an aid in the diagnosis of influenza from Nasopharyngeal swab specimens and should not be used as a sole basis  for treatment. Nasal washings and aspirates are unacceptable for Xpert Xpress SARS-CoV-2/FLU/RSV testing.  Fact Sheet for Patients: EntrepreneurPulse.com.au  Fact Sheet for Healthcare Providers: IncredibleEmployment.be  This test is not yet approved or cleared by the [place] FDA and has been authorized for detection and/or diagnosis of SARS-CoV-2 by FDA under an Emergency Use Authorization (EUA). This EUA will remain in effect (meaning this test can be used) for the duration of the COVID-19 declaration under Section 564(b)(1) of the Act, 21 U.S.C. section 360bbb-3(b)(1), unless the authorization is terminated or revoked.  Performed at [facility], [place] [address] [place], [place] [address]   MRSA PCR Screening  Status: None   Collection Time: 04/04/20 10:17 PM   Specimen: Nasopharyngeal  Result Value Ref Range Status   MRSA by PCR NEGATIVE NEGATIVE Final    Comment:        The GeneXpert MRSA Assay (FDA approved for NASAL specimens only), is one component of a comprehensive MRSA colonization surveillance program. It is not intended to diagnose MRSA infection nor to guide or monitor treatment for MRSA infections. Performed at [facility], [place] [address] [place], [place] [address]       Studies: DG CHEST PORT 1 VIEW  Result Date: 04/05/2020 CLINICAL DATA:  59 year old male with history of bronchoscopy and biopsy. EXAM: PORTABLE CHEST 1 VIEW COMPARISON:  Chest x-ray 04/04/2020. FINDINGS: Irregular opacities are again noted throughout the right mid to lower lung, with multiple nodular densities again noted in the left lung, similar to prior examinations. No definite pleural effusions. No evidence of pulmonary edema. Heart size is mildly enlarged. Mediastinal contours are distorted. IMPRESSION: 1. The overall appearance the chest is very similar to prior examinations, with presumed atelectasis and consolidation  throughout the right mid to lower lung, and multiple metastatic nodules in the left lung. No new acute findings. Electronically Signed   By: Vinnie Langton M.D.   On: 04/05/2020 12:37   DG C-ARM BRONCHOSCOPY  Result Date: 04/05/2020 C-ARM BRONCHOSCOPY: Fluoroscopy was utilized by the requesting physician.  No radiographic interpretation.    Scheduled Meds: . dextromethorphan-guaiFENesin  1 tablet Oral BID  . enoxaparin (LOVENOX) injection  40 mg Subcutaneous Q24H  . fenofibrate  160 mg Oral Daily  . folic acid  1 mg Oral Daily  . methylPREDNISolone (SOLU-MEDROL) injection  40 mg Intravenous Q12H    Continuous Infusions: . ceFEPime (MAXIPIME) IV 2 g (04/05/20 3664)  . vancomycin 1,000 mg (04/05/20 0435)     LOS: 1 day     Alma Friendly, MD [facility]  If 7PM-7AM, please contact night-coverage [email or web address] 04/05/2020, 2:25 PM

## 2020-04-05 NOTE — Op Note (Signed)
Indication : Recurrent RT pneumonia in this ex smoker with stage 2 adenoCA lung s/p chemoRT Written informed consent was obtained prior to the procedure. The risks of the procedure including coughing, bleeding and the small chance of lung puncture requiring chest tube were discussed in great detail. The benefits & alternatives including serial follow up were also discussed.  4 mg versed & 150  mcg fentnayl used in divided doses during the procedure Bronchoscope entered from the left nare. Upper airway nml Vocal cords showed nml appearance & motion. Trachea & bronchial tree examined to the subsegmental level. Moderate amount of purulent yellow white secretions were noted pooling gin the RT main stem & RUL bronchus spilling over into rLL. No endobronchial lesions seen. Necrotic area visualised in RUL & individual sub segmental bronchi could nt be identified Trans bronchial biopsies x 4 were obtained from the RML/RLL under fluoroscopy. BAL was also obtained from the RUL.  There was moderate coughing  during the procedure.   Specimens  BAL RUL for micro, afb, fungal & cytology TBBX for path  A CXR will be performed to r/o presence of pneumothorax.  Deaundre Allston V.  [phone]

## 2020-04-05 NOTE — Plan of Care (Signed)
POC initiated and progressing. 

## 2020-04-05 NOTE — Interval H&P Note (Signed)
History and Physical Interval Note:  04/05/2020 10:48 AM  Richard Li  has presented today for surgery, with the diagnosis of Pneumonia.  The various methods of treatment have been discussed with the patient and family. After consideration of risks, benefits and other options for treatment, the patient has consented to  Procedure(s): VIDEO BRONCHOSCOPY WITH FLUORO (Right) as a surgical intervention.  The patient's history has been reviewed, patient examined, no change in status, stable for surgery.  I have reviewed the patient's chart and labs.  Questions were answered to the patient's satisfaction.     Leanna Sato [place]

## 2020-04-05 NOTE — Progress Notes (Signed)
Name: Richard Li MRN: [id number] DOB: 1960-10-13    ADMISSION DATE:  04/04/2020 CONSULTATION DATE:  04/05/2020   REFERRING MD :  Jamse Arn, [facility]  CHIEF COMPLAINT: Recurrent pneumonia  BRIEF PATIENT DESCRIPTION: 59 year old with stage II adenocarcinoma diagnosed 12/2019 undergoing concurrent chemoradiation presenting with recurrent pneumonia.  [name] for the same problem , extensive infiltrate on the right  SIGNIFICANT EVENTS  admitted 11/11-11/14 and treated with IV antibiotics. readmitted 11/19 for multifocal infiltrates in the right and treated with vancomycin and cefepime.  CT angiogram was performed. admitted 11/26 -11/30this time with fevers 201 septic shock, swallow evaluation suggested only mild aspiration risk 11/29  STUDIES:  CT angiogram chest 11/19 >> extensive patchy consolidation groundglass opacity throughout the right lung, cavitary right upper lung mass is obscured, new subcarinal right hilar lymphadenopathy Swallow evaluation 11/29 mild aspiration risk   HISTORY OF PRESENT ILLNESS: This is the fourth admission for this 59 year old unfortunate man who was diagnosed with T3 N0 M0 adenocarcinoma, stage II when he presented with right upper lobe perihilar cavitary mass with 7 mm subcarinal lymph node and mildly hypermetabolic groundglass opacities in the right middle lobe and left upper lobe.  He was considered not a candidate for surgery and was started on concurrent chemoradiation.  Completed radiation therapy early November and completed 4 cycles of chemotherapy when he developed right-sided infiltrate and was initially treated with outpatient oral Levaquin for community-acquired pneumonia.  He was then admitted 11/11-11/14 and treated with IV antibiotics. He was again readmitted 11/19 for multifocal infiltrates in the right and treated with vancomycin and cefepime.  CT angiogram was performed. He was again admitted 11/26 -11/30this time with  fevers 201 septic shock, swallow evaluation suggested only mild aspiration risk 11/29  His last chemotherapy was 11/15 his last radiation was 11/30. He is now admitted with generalized weakness, cough productive of yellow-green sputum and increased shortness of breath.  He can only walk a few yards before he gets dyspneic.  He was just treated with a Medrol Dosepak without significant relief. Noted to be mildly hypotensive in the ED, improved with 3 L of fluids, lactate was 3.2, WBC count 17.1.  Chest x-ray shows extensive multifocal infiltrate on the right , to my review there is mediastinal shift and indicating some right sided volume loss  PCCM is consulted to assist with management   SUBJECTIVE:  Afebrile On room air Complains of limegreen sputum  VITAL SIGNS: Temp:  [97.7 F (36.5 C)-98.6 F (37 C)] 97.7 F (36.5 C) (12/12 1028) Pulse Rate:  [88-123] 96 (12/12 1028) Resp:  [16-31] 16 (12/12 1028) BP: (90-134)/(56-78) 134/68 (12/12 1028) SpO2:  [93 %-100 %] 96 % (12/12 1028) Weight:  [76.5 kg-89.8 kg] 76.5 kg (12/11 1736)  PHYSICAL EXAMINATION: Gen:      Middle-age man, no distress  HEENT:  EOMI, sclera anicteric, mild pallor Neck:     No JVD; no thyromegaly Lungs:   Coarse breath sounds on right, no accessory muscle use, no rhonchi CV:         Regular rate and rhythm; no murmurs Abd:      + bowel sounds; soft, non-tender; no palpable masses, no distension Ext:    No edema; adequate peripheral perfusion Skin:      Warm and dry; no rash Neuro: alert and oriented x 3, soft voice, appears weak and deconditioned   Labs show drop in hemoglobin from 8-7, no leukocytosis  Recent Labs  Lab 04/04/20 1040 04/04/20 1913  04/05/20 0218 04/05/20 0741  NA 134*  --  133* 136  K 3.7  --  5.4* 4.1  CL 96*  --  101 102  CO2 25  --  20* 22  BUN 19  --  15 12  CREATININE 1.16 0.84 0.70 0.57*  GLUCOSE 133*  --  138* 98   Recent Labs  Lab 04/04/20 1040 04/04/20 1913  04/05/20 0218  HGB 9.5* 8.2* 7.0*  HCT 28.8* 25.4* 20.8*  WBC 17.1* 14.3* 9.9  PLT 853* 608* 480*   DG Chest Port 1 View  Result Date: 04/04/2020 CLINICAL DATA:  59 year old male with history of increasing shortness of breath. EXAM: PORTABLE CHEST 1 VIEW COMPARISON:  Chest x-ray 03/20/2020. FINDINGS: Significant worsening aeration in the right lung, which may reflect worsening atelectasis and/or consolidation, most evident throughout the right mid to lower lung. Slight rightward shift of cardiomediastinal structures, indicative of substantial right-sided volume loss. Left lung appears relatively clear, with exception of several nodular densities which could represent areas of infectious consolidation, or metastatic lesions. Probable small right pleural effusion. No definite left pleural effusion. No pneumothorax. Heart size is normal. IMPRESSION: 1. Substantially worsened aeration in the right lung, which in large part appears to reflect worsening atelectasis, although there is also likely worsening airspace disease as well. 2. The multiple left-sided pulmonary nodules are again noted, which could be infectious or neoplastic in etiology. Continued attention on follow-up studies is recommended. Electronically Signed   By: Vinnie Langton M.D.   On: 04/04/2020 10:53    ASSESSMENT / PLAN:  -Multifocal pneumonia on the right -progressive worsening of infiltrates over the last month on serial imaging, current chest x-ray seems to indicate some degree of volume loss on the right  -Underlying stage II adenocarcinoma lung on concurrent chemoradiation  Differential here includes radiation pneumonitis, less likely resistant bug since he has received really broad-spectrum antibiotics during hospital admissions, uncommon organism seems unlikely since this seems to have progressed on treatment-no reason to suspect AFB or fungal etiology.  Small possibility of tracheoesophageal fistula exists however this  appears early in the time course for radiation and previous swallow evaluation has been normal -Lymphangitic spread unlikely   Recommend- -bronchoscopy today under conscious sedation.  Discussed risks and benefits including risk of bleeding and pneumothorax -Continue cefepime while awaiting culture data, vancomycin can be stopped since MRSA PCR negative -Continue empiric Solu-Medrol 40 every 12 for radiation pneumonitis  Kara Mead MD. FCCP. [facility] See Amion for pager  If no response to pager , please call [phone]  After 7:00 pm call Elink  [phone]    04/05/2020, 10:50 AM

## 2020-04-06 LAB — BASIC METABOLIC PANEL
Anion gap: 10 (ref 5–15)
BUN: 11 mg/dL (ref 6–20)
CO2: 26 mmol/L (ref 22–32)
Calcium: 8.7 mg/dL — ABNORMAL LOW (ref 8.9–10.3)
Chloride: 101 mmol/L (ref 98–111)
Creatinine, Ser: 0.62 mg/dL (ref 0.61–1.24)
GFR, Estimated: >60 mL/min (ref >60)
Glucose, Bld: 117 mg/dL — ABNORMAL HIGH (ref 70–99)
Potassium: 3.9 mmol/L (ref 3.5–5.1)
Sodium: 137 mmol/L (ref 135–145)

## 2020-04-06 LAB — CBC WITH DIFFERENTIAL/PLATELET
Abs Immature Granulocytes: 0.09 10*3/uL — ABNORMAL HIGH (ref 0.00–0.07)
Basophils Absolute: 0 10*3/uL (ref 0.0–0.1)
Basophils Relative: 0 %
Eosinophils Absolute: 0 10*3/uL (ref 0.0–0.5)
Eosinophils Relative: 0 %
HCT: 25.3 % — ABNORMAL LOW (ref 39.0–52.0)
Hemoglobin: 8.4 g/dL — ABNORMAL LOW (ref 13.0–17.0)
Immature Granulocytes: 1 %
Lymphocytes Relative: 3 %
Lymphs Abs: 0.3 10*3/uL — ABNORMAL LOW (ref 0.7–4.0)
MCH: 28.8 pg (ref 26.0–34.0)
MCHC: 33.2 g/dL (ref 30.0–36.0)
MCV: 86.6 fL (ref 80.0–100.0)
Monocytes Absolute: 0.4 10*3/uL (ref 0.1–1.0)
Monocytes Relative: 4 %
Neutro Abs: 9.4 10*3/uL — ABNORMAL HIGH (ref 1.7–7.7)
Neutrophils Relative %: 92 %
Platelets: 484 10*3/uL — ABNORMAL HIGH (ref 150–400)
RBC: 2.92 MIL/uL — ABNORMAL LOW (ref 4.22–5.81)
RDW: 13.8 % (ref 11.5–15.5)
WBC: 10.3 10*3/uL (ref 4.0–10.5)
nRBC: 0 % (ref 0.0–0.2)

## 2020-04-06 LAB — TYPE AND SCREEN
ABO/RH(D): O POS
Antibody Screen: NEGATIVE
Unit division: 0

## 2020-04-06 LAB — URINE CULTURE: Culture: NO GROWTH

## 2020-04-06 LAB — BPAM RBC
Blood Product Expiration Date: 202201112359
ISSUE DATE / TIME: 202112121422
Unit Type and Rh: 5100

## 2020-04-06 MED ORDER — PROSOURCE PLUS PO LIQD
30.0000 mL | Freq: Two times a day (BID) | ORAL | Status: DC
  Administered 2020-04-06 – 2020-04-07 (×2): 30 mL via ORAL
  Filled 2020-04-06 (×2): qty 30

## 2020-04-06 MED ORDER — PREDNISONE 20 MG PO TABS: 40.0000 mg | ORAL_TABLET | Freq: Every day | ORAL | Status: DC

## 2020-04-06 MED ORDER — ADULT MULTIVITAMIN W/MINERALS CH
1.0000 | ORAL_TABLET | Freq: Every day | ORAL | Status: DC
  Administered 2020-04-06 – 2020-04-07 (×2): 1 via ORAL
  Filled 2020-04-06 (×2): qty 1

## 2020-04-06 MED ORDER — PREDNISONE 20 MG PO TABS
40.0000 mg | ORAL_TABLET | Freq: Two times a day (BID) | ORAL | Status: DC
  Administered 2020-04-06 – 2020-04-07 (×2): 40 mg via ORAL
  Filled 2020-04-06 (×2): qty 2

## 2020-04-06 NOTE — Progress Notes (Signed)
PROGRESS NOTE  Richard Li GNF:[id number] DOB: Oct 26, 1960 DOA: 04/04/2020 PCP: [name], DO  HPI/Recap of past 24 hours: HPI from Dr Nash Mantis is an 59 y.o. male with stage II NSCLC, COPD, HTN who presents with recurrent shortness of breath and progressive weakness.  This is the patient's fourth admission in the last 30 days for the same complaints. Patient was last discharged on 03/24/2020 after being managed for septic shock likely 2/2 multifocal pneumonia/aspiration pneumonia, was also noted to have decompensated HFpEF. Patient now presents complaining of increasing weakness, shortness of breath, persistent productive cough of frothy white phlegm for the past month.  In the ED, noted to be afebrile, chest x-ray showed worsened aeration of the right lung likely due to increased infiltrates, saturating well on room air, hypotensive, received 3 L of IV fluid, started on vancomycin and cefepime.  PCCM consulted.  Patient admitted for further management.    Today, patient denies any new complaints, still having productive cough, denies any chest pain, nausea/vomiting, fever/chills.    Assessment/Plan: Principal Problem:   Sepsis due to pneumonia [facility]) Active Problems:   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HCC)   GERD   Essential hypertension   Adenocarcinoma of right lung, stage 2 (HCC)   Multifocal pneumonia   Generalized weakness   Sepsis secondary to ??recurrent Multifocal Pneumonia Currently afebrile, with no leukocytosis, saturating well on room air Patient does meet sepsis criteria, leukocytosis, tachycardia, and tachypnea  BC x2 NGTD UA negative Chest x-ray showed substantially worsened aeration in the right lung, which in large part appears to reflect worsening atelectasis, although there is also likely worsening airspace disease as well PCCM consulted, s/p bronchoscopy on 04/05/20, respiratory culture, BAL samples  taken pending.  Recommend steroid for possible radiation pneumonitis Continue cefepime, PCCM recommend d/c vancomycin Continue steroid Supplemental O2, I-S Monitor closely  Normocytic anemia/anemia of chronic disease Vitamin B12 deficiency Baseline hemoglobin around 8 Transfused 1 unit of PRBC on 84/69/6295 Vitamin M84, folic acid supplementation Daily CBC  Chronic diastolic HF Appears euvolemic Echo done on 02/2020 showed EF of 60 to 65%, no regional wall motion abnormalities, grade 1 diastolic dysfunction Monitor closely  Hypertension BP stable Hold home losartan  Stage II lung adenocarcinoma Status post chemoradiation Continue outpatient follow-up with Dr. Earlie Server   Adult failure to thrive/deconditioned Nutrition consult requested PT/OT  Anxiety and depression Continue lorazepam and trazodone       Malnutrition Type:      Malnutrition Characteristics:      Nutrition Interventions:       Estimated body mass index is 24.21 kg/m as calculated from the following:   Height as of this encounter: _0  (1.778 m).   Weight as of this encounter: 76.5 kg.     Code Status: Partial  Family Communication: Discussed with patient  Disposition Plan: Status is: Inpatient  Remains inpatient appropriate because:Inpatient level of care appropriate due to severity of illness   Dispo: The patient is from: Home              Anticipated d/c is to: Home              Anticipated d/c date is: 2 days              Patient currently is not medically stable to d/c.    Consultants:  PCCM  Procedures:  Bronchoscopy on 04/05/2020  Antimicrobials:  Cefepime  DVT prophylaxis: Lovenox   Objective: Vitals:  04/05/20 2342 04/06/20 0537 04/06/20 0736 04/06/20 1115  BP: 113/67 120/72 130/76 (!) 125/58  Pulse: 86 74 73 77  Resp: _0 Temp: 98.8 F (37.1 C) 97.8 F (36.6 C) 97.9 F (36.6 C) 97.9 F (36.6 C)  TempSrc: Oral Oral Oral Oral  SpO2:  93% 96% 96% 95%  Weight:      Height:        Intake/Output Summary (Last 24 hours) at 04/06/2020 1329 Last data filed at 04/06/2020 1118 Gross per 24 hour  Intake 1129.5 ml  Output 1700 ml  Net -570.5 ml   Filed Weights   04/04/20 1536 04/04/20 1736  Weight: 89.8 kg 76.5 kg    Exam:  General: NAD, weak, deconditioned  Cardiovascular: S1, S2 present  Respiratory:  Diminished breath sounds bilaterally  Abdomen: Soft, nontender, nondistended, bowel sounds present  Musculoskeletal: No bilateral pedal edema noted  Skin: Normal  Psychiatry: Normal mood   Data Reviewed: CBC: Recent Labs  Lab 04/04/20 1040 04/04/20 1913 04/05/20 0218 04/05/20 2016 04/06/20 0834  WBC 17.1* 14.3* 9.9  --  10.3  NEUTROABS  --   --   --   --  9.4*  HGB 9.5* 8.2* 7.0* 9.2* 8.4*  HCT 28.8* 25.4* 20.8* 28.4* 25.3*  MCV 89.4 88.5 87.4  --  86.6  PLT 853* 608* 480*  --  423*   Basic Metabolic Panel: Recent Labs  Lab 04/04/20 1040 04/04/20 1913 04/05/20 0218 04/05/20 0741 04/06/20 0834  NA 134*  --  133* 136 137  K 3.7  --  5.4* 4.1 3.9  CL 96*  --  101 102 101  CO2 25  --  20* 22 26  GLUCOSE 133*  --  138* 98 117*  BUN 19  --  _1 CREATININE 1.16 0.84 0.70 0.57* 0.62  CALCIUM 9.3  --  8.4* 8.2* 8.7*   GFR: Estimated Creatinine Clearance: 102.7 mL/min (by C-G formula based on SCr of 0.62 mg/dL). Liver Function Tests: No results for input(s): AST, ALT, ALKPHOS, BILITOT, PROT, ALBUMIN in the last 168 hours. No results for input(s): LIPASE, AMYLASE in the last 168 hours. No results for input(s): AMMONIA in the last 168 hours. Coagulation Profile: Recent Labs  Lab 04/04/20 1254  INR 1.2   Cardiac Enzymes: No results for input(s): CKTOTAL, CKMB, CKMBINDEX, TROPONINI in the last 168 hours. BNP (last 3 results) No results for input(s): PROBNP in the last 8760 hours. HbA1C: No results for input(s): HGBA1C in the last 72 hours. CBG: No results for input(s): GLUCAP in  the last 168 hours. Lipid Profile: No results for input(s): CHOL, HDL, LDLCALC, TRIG, CHOLHDL, LDLDIRECT in the last 72 hours. Thyroid Function Tests: No results for input(s): TSH, T4TOTAL, FREET4, T3FREE, THYROIDAB in the last 72 hours. Anemia Panel: Recent Labs    04/05/20 0741  VITAMINB12 225  FOLATE 10.4  FERRITIN 853*  TIBC 98*  IRON 21*   Urine analysis:    Component Value Date/Time   COLORURINE YELLOW 04/04/2020 2217   APPEARANCEUR CLEAR 04/04/2020 2217   LABSPEC 1.006 04/04/2020 2217   PHURINE 6.0 04/04/2020 2217   GLUCOSEU NEGATIVE 04/04/2020 2217   GLUCOSEU NEGATIVE 12/12/2011 0944   HGBUR NEGATIVE 04/04/2020 2217   [place] NEGATIVE 04/04/2020 2217   [place] NEGATIVE 04/04/2020 2217   PROTEINUR NEGATIVE 04/04/2020 2217   UROBILINOGEN 0.2 12/12/2011 0944   NITRITE NEGATIVE 04/04/2020 2217   LEUKOCYTESUR NEGATIVE 04/04/2020 2217   Sepsis Labs: _2 (procalcitonin:4,lacticidven:4)  )  Recent Results (from the past 240 hour(s))  Blood Culture (routine x 2)     Status: None (Preliminary result)   Collection Time: 04/04/20 10:56 AM   Specimen: BLOOD  Result Value Ref Range Status   Specimen Description BLOOD BLOOD RIGHT HAND  Final   Special Requests   Final    BOTTLES DRAWN AEROBIC AND ANAEROBIC Blood Culture adequate volume   Culture   Final    NO GROWTH 2 DAYS Performed at [facility], [address] [place], [place] [address]    Report Status PENDING  Incomplete  Blood Culture (routine x 2)     Status: None (Preliminary result)   Collection Time: 04/04/20 11:12 AM   Specimen: BLOOD  Result Value Ref Range Status   Specimen Description BLOOD RIGHT ANTECUBITAL  Final   Special Requests   Final    BOTTLES DRAWN AEROBIC AND ANAEROBIC Blood Culture adequate volume   Culture   Final    NO GROWTH 2 DAYS Performed at [facility], [place] [address], [place], [place] [address]    Report Status PENDING  Incomplete  Resp Panel by RT-PCR  (Flu A&B, Covid) Nasopharyngeal Swab     Status: None   Collection Time: 04/04/20 11:13 AM   Specimen: Nasopharyngeal Swab; Nasopharyngeal(NP) swabs in vial transport medium  Result Value Ref Range Status   SARS Coronavirus 2 by RT PCR NEGATIVE NEGATIVE Final    Comment: (NOTE) SARS-CoV-2 target nucleic acids are NOT DETECTED.  The SARS-CoV-2 RNA is generally detectable in upper respiratory specimens during the acute phase of infection. The lowest concentration of SARS-CoV-2 viral copies this assay can detect is 138 copies/mL. A negative result does not preclude SARS-Cov-2 infection and should not be used as the sole basis for treatment or other patient management decisions. A negative result may occur with  improper specimen collection/handling, submission of specimen other than nasopharyngeal swab, presence of viral mutation(s) within the areas targeted by this assay, and inadequate number of viral copies(<138 copies/mL). A negative result must be combined with clinical observations, patient history, and epidemiological information. The expected result is Negative.  Fact Sheet for Patients:  EntrepreneurPulse.com.au  Fact Sheet for Healthcare Providers:  IncredibleEmployment.be  This test is no t yet approved or cleared by the [place] FDA and  has been authorized for detection and/or diagnosis of SARS-CoV-2 by FDA under an Emergency Use Authorization (EUA). This EUA will remain  in effect (meaning this test can be used) for the duration of the COVID-19 declaration under Section 564(b)(1) of the Act, 21 U.S.C.section 360bbb-3(b)(1), unless the authorization is terminated  or revoked sooner.       Influenza A by PCR NEGATIVE NEGATIVE Final   Influenza B by PCR NEGATIVE NEGATIVE Final    Comment: (NOTE) The Xpert Xpress SARS-CoV-2/FLU/RSV plus assay is intended as an aid in the diagnosis of influenza from Nasopharyngeal swab specimens  and should not be used as a sole basis for treatment. Nasal washings and aspirates are unacceptable for Xpert Xpress SARS-CoV-2/FLU/RSV testing.  Fact Sheet for Patients: EntrepreneurPulse.com.au  Fact Sheet for Healthcare Providers: IncredibleEmployment.be  This test is not yet approved or cleared by the [place] FDA and has been authorized for detection and/or diagnosis of SARS-CoV-2 by FDA under an Emergency Use Authorization (EUA). This EUA will remain in effect (meaning this test can be used) for the duration of the COVID-19 declaration under Section 564(b)(1) of the Act, 21 U.S.C. section 360bbb-3(b)(1), unless the  authorization is terminated or revoked.  Performed at [facility], [place] [address], [place], [place] [address]   Urine culture     Status: None   Collection Time: 04/04/20 10:17 PM   Specimen: In/Out Cath Urine  Result Value Ref Range Status   Specimen Description IN/OUT CATH URINE  Final   Special Requests NONE  Final   Culture   Final    NO GROWTH Performed at [facility], [place] [address], [place], [place] [address]    Report Status 04/06/2020 FINAL  Final  MRSA PCR Screening     Status: None   Collection Time: 04/04/20 10:17 PM   Specimen: Nasopharyngeal  Result Value Ref Range Status   MRSA by PCR NEGATIVE NEGATIVE Final    Comment:        The GeneXpert MRSA Assay (FDA approved for NASAL specimens only), is one component of a comprehensive MRSA colonization surveillance program. It is not intended to diagnose MRSA infection nor to guide or monitor treatment for MRSA infections. Performed at [facility], [place] [address], [place], [place] [address]   Culture, fungus without smear     Status: None (Preliminary result)   Collection Time: 04/05/20 11:26 AM   Specimen: Bronchial Washing, Right; Lung  Result Value Ref Range Status   Specimen Description BRONCHIAL ALVEOLAR LAVAGE  Final    Special Requests [facility]  Final   Culture   Final    NO FUNGUS ISOLATED AFTER 1 DAY Performed at [facility], [place] [address], [place], [place] [address]    Report Status PENDING  Incomplete  Culture, respiratory     Status: None (Preliminary result)   Collection Time: 04/05/20 11:26 AM   Specimen: Bronchial Washing, Right; Lung  Result Value Ref Range Status   Specimen Description BRONCHIAL ALVEOLAR LAVAGE  Final   Special Requests [facility]  Final   Gram Stain   Final    ABUNDANT WBC PRESENT,BOTH PMN AND MONONUCLEAR RARE GRAM POSITIVE COCCI IN PAIRS    Culture   Final    CULTURE REINCUBATED FOR BETTER GROWTH Performed at [facility], [place] [address] [place],  [address]    Report Status PENDING  Incomplete      Studies: No results found.  Scheduled Meds: . dextromethorphan-guaiFENesin  1 tablet Oral BID  . enoxaparin (LOVENOX) injection  40 mg Subcutaneous Q24H  . fenofibrate  160 mg Oral Daily  . folic acid  1 mg Oral Daily  . predniSONE  40 mg Oral BID WC  . vitamin B-12  1,000 mcg Oral Daily    Continuous Infusions: . ceFEPime (MAXIPIME) IV 2 g (04/06/20 0535)     LOS: 2 days     Alma Friendly, MD [facility]  If 7PM-7AM, please contact night-coverage [email or web address] 04/06/2020, 1:29 PM

## 2020-04-06 NOTE — Progress Notes (Signed)
MRSA PCR neg. Ok to [employer] per Dr. Tamala Julian from pulm. Spoke with micro about the BAL culture>>looking more like normal respiratory flora at this point.   Onnie Boer, PharmD, BCIDP, AAHIVP, CPP Infectious Disease Pharmacist 04/06/2020 11:00 AM

## 2020-04-06 NOTE — Progress Notes (Signed)
Patient ambulated to bathroom and back to bed. Tolerated fairly.

## 2020-04-06 NOTE — Progress Notes (Signed)
04/06/2020 Pulmonary Note I have seen and evaluated the patient for right sided pneumonitis.  S:  No events. Overall feeling slightly better. Coughing up "lime green" sputum. Denies pain. Remains weak with MMRC 2 dyspnea.  O: Blood pressure 130/76, pulse 73, temperature 97.9 F (36.6 C), temperature source Oral, resp. rate 19, height _0  (1.778 m), weight 76.5 kg, SpO2 96 %.  Thin man in NAD Lungs sounds severely diminished BL Has some mild muscle wasting Flat affect Moves all 4 ext to command  A:  Recurrrent R sided pneumonia with leading differential being radiation vs. Infectious pneumonitis.  Lymphangitic spread less likely with active chemoradiation.  S/p bronchoscopy with BAL/Tbbx on 04/04/20 by Dr. Elsworth Soho.  P:  - f/u culture and cytology data - add IS, flutter - would like to get culture maturity of GPCs in BAL before sending home as he has been hospitalized 3 times for this recently; I would favor a longer course of abx given structural abnormalities on CT - Switch methylprednisolone to PO prednisone - Will follow with you   04/06/2020 Erskine Emery MD

## 2020-04-06 NOTE — Progress Notes (Signed)
Initial Nutrition Assessment  DOCUMENTATION CODES:   Severe malnutrition in context of chronic illness  INTERVENTION:   Pt would likely benefit from nutrition support given prolonged poor intake. Follow for progression.    Try Magic cup TID with meals, each supplement provides 290 kcal and 9 grams of protein  Try 30 ml ProSource Plus BID, each supplement provides 100 kcals and 15 grams protein.   MVI daily   NUTRITION DIAGNOSIS:   Severe Malnutrition related to chronic illness (stg II NSCLC, mult hospitalizations) as evidenced by energy intake < or equal to 75% for > or equal to 1 month,percent weight loss,moderate fat depletion,moderate muscle depletion.  GOAL:   Patient will meet greater than or equal to 90% of their needs  MONITOR:   PO intake,Supplement acceptance,Weight trends,Labs,I & O's  REASON FOR ASSESSMENT:   Consult Assessment of nutrition requirement/status  ASSESSMENT:   Patient with PMH significant for stage II NSCLC s/p chemoraditation, COPD, CHF, and HTN. Recently discharged on 11/30 after being managed for septic shock 2/2 to PNA. Presents this admission with recurrent multifocal PNA.   Pt endorses a loss in appetite over the last 6-8 weeks due to ongoing breathing issues. States during this time he consumed roughly 25% of three meals daily. Meals consisted of B- eggs, muffin L- seafood, vegetable D- meat, vegetable, grain. Last two meal completions charted as 15% and 25%. Does not like supplementation such as Boost Breeze or Ensure. Discussed the importance of protein intake for preservation of lean body mass. Pt willing to try Magic Cup.   Pt endorses a UBW of 200 lb (last at that weight 6-8 weeks ago) and a weight loss of 30 lb. Records indicate pt weighed 217 lb on 09/10/19 and 168 lb this admission (22.5% wt loss in 6 months, significant for time frame).   UOP: 800 ml x 24 hrs   Medications: folic acid, prednisone, Vitamin B12 Labs: CBG  98-138  NUTRITION - FOCUSED PHYSICAL EXAM:  Flowsheet Row Most Recent Value  Orbital Region Mild depletion  Upper Arm Region Moderate depletion  Thoracic and Lumbar Region Unable to assess  Buccal Region Mild depletion  Temple Region Moderate depletion  Clavicle Bone Region Moderate depletion  Clavicle and Acromion Bone Region Moderate depletion  Scapular Bone Region Unable to assess  Dorsal Hand Mild depletion  Patellar Region Moderate depletion  Anterior Thigh Region Moderate depletion  Posterior Calf Region Moderate depletion  Edema (RD Assessment) Mild  Hair Reviewed  Eyes Reviewed  Mouth Reviewed  Skin Reviewed  Nails Reviewed     Diet Order:   Diet Order            Diet regular Room service appropriate? Yes; Fluid consistency: Thin  Diet effective now                 EDUCATION NEEDS:   Education needs have been addressed  Skin:  Skin Assessment: Reviewed RN Assessment  Last BM:  12/13  Height:   Ht Readings from Last 1 Encounters:  04/04/20 5\' 10"  (1.778 m)    Weight:   Wt Readings from Last 1 Encounters:  04/04/20 76.5 kg    BMI:  Body mass index is 24.21 kg/m.  Estimated Nutritional Needs:   Kcal:  2300-2500 kcal  Protein:  115-130 grams  Fluid:  >/= 2 L/day  Mariana Single RD, LDN Clinical Nutrition Pager listed in [place]

## 2020-04-06 NOTE — Evaluation (Signed)
Occupational Therapy Evaluation and Discharge Patient Details Name: Richard Li MRN: [id number] DOB: October 19, 1960 Today's Date: 04/06/2020    History of Present Illness Pt is a 59 year old male who presented with recurrent shortness of breath and progressive weakness. In the past 30 days, this is the pt's 4th admission for the same issues. He was diagnosed with stage 2 lung adenocancer in September of 2021. He has been receiving chemoRT. He has had a persistent productive cough of frothy white phlegm for the past month. He was admitted this time with sepsis due to pneumonia. Pt received bronchoscopy 04/05/20 with no new acute findings. PMH: lumbago, HTN, chronic airway obstruction, asthma, and anxiety.   Clinical Impression   Pt is functioning at up to a supervision level. His is knowledgeable in energy conservation strategies and has a shower seat, should he need to sit to rest. No further OT needs.    Follow Up Recommendations  No OT follow up    Equipment Recommendations  None recommended by OT    Recommendations for Other Services       Precautions / Restrictions Precautions Precautions: Fall      Mobility Bed Mobility Overal bed mobility: Modified Independent             General bed mobility comments: HOB up    Transfers Overall transfer level: Independent Equipment used: None                  Balance Overall balance assessment: Mild deficits observed, not formally tested   Sitting balance-Leahy Scale: Good       Standing balance-Leahy Scale: Fair                             ADL either performed or assessed with clinical judgement   ADL                                         General ADL Comments: Overall functioning at a set up to supervision level. Educated in energy conservation strategies. Pt declined taking a walker home.     Vision Patient Visual Report: No change from baseline       Perception      Praxis      Pertinent Vitals/Pain Pain Assessment: No/denies pain     Hand Dominance Right   Extremity/Trunk Assessment Upper Extremity Assessment Upper Extremity Assessment: Generalized weakness   Lower Extremity Assessment Lower Extremity Assessment: Generalized weakness   Cervical / Trunk Assessment Cervical / Trunk Assessment: Normal   Communication Communication Communication: No difficulties   Cognition Arousal/Alertness: Awake/alert Behavior During Therapy: Flat affect Overall Cognitive Status: Within Functional Limits for tasks assessed                                     General Comments       Exercises     Shoulder Instructions      Home Living Family/patient expects to be discharged to:: Private residence Living Arrangements: Spouse/significant other Available Help at Discharge: Family;Available PRN/intermittently Type of Home: House Home Access: Stairs to enter [employer] of Steps: 5 Entrance Stairs-Rails: Can reach both Home Layout: One level     Bathroom Shower/Tub: Tub/shower unit;Walk-in shower   Bathroom Toilet: Standard  Home Equipment: Shower seat - built in;Hand held shower head          Prior Functioning/Environment Level of Independence: Independent        Comments: Pt independent with ADLs, 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        OT Problem List:        OT Treatment/Interventions:      OT Goals(Current goals can be found in the care plan section) Acute Rehab OT Goals Patient Stated Goal: to get stronger and increase his activity tolerance  OT Frequency:     Barriers to D/C:            Co-evaluation              AM-PAC OT "6 Clicks" Daily Activity     Outcome Measure Help from another person eating meals?: None Help from another person taking care of personal grooming?: A Little Help from another person toileting,  which includes using toliet, bedpan, or urinal?: A Little Help from another person bathing (including washing, rinsing, drying)?: A Little Help from another person to put on and taking off regular upper body clothing?: None Help from another person to put on and taking off regular lower body clothing?: A Little 6 Click Score: 20   End of Session    Activity Tolerance: Patient tolerated treatment well Patient left: in bed;with call bell/phone within reach  OT Visit Diagnosis: Muscle weakness (generalized) (M62.81)                Time: 2863-8177 OT Time Calculation (min): 16 min Charges:  OT General Charges $OT Visit: 1 Visit OT Evaluation $OT Eval Low Complexity: 1 Low  Richard Li, OTR/L Acute Rehabilitation Services Pager: [phone] Office: [phone]  Richard Li 04/06/2020, 9:41 AM

## 2020-04-07 ENCOUNTER — Telehealth: Payer: Self-pay | Admitting: Pulmonary Disease

## 2020-04-07 DIAGNOSIS — J449 Chronic obstructive pulmonary disease, unspecified: Secondary | ICD-10-CM

## 2020-04-07 DIAGNOSIS — E43 Unspecified severe protein-calorie malnutrition: Secondary | ICD-10-CM | POA: Insufficient documentation

## 2020-04-07 LAB — CBC WITH DIFFERENTIAL/PLATELET
Abs Immature Granulocytes: 0.09 10*3/uL — ABNORMAL HIGH (ref 0.00–0.07)
Basophils Absolute: 0 10*3/uL (ref 0.0–0.1)
Basophils Relative: 0 %
Eosinophils Absolute: 0 10*3/uL (ref 0.0–0.5)
Eosinophils Relative: 0 %
HCT: 24.1 % — ABNORMAL LOW (ref 39.0–52.0)
Hemoglobin: 8.2 g/dL — ABNORMAL LOW (ref 13.0–17.0)
Immature Granulocytes: 1 %
Lymphocytes Relative: 5 %
Lymphs Abs: 0.5 10*3/uL — ABNORMAL LOW (ref 0.7–4.0)
MCH: 29.5 pg (ref 26.0–34.0)
MCHC: 34 g/dL (ref 30.0–36.0)
MCV: 86.7 fL (ref 80.0–100.0)
Monocytes Absolute: 0.7 10*3/uL (ref 0.1–1.0)
Monocytes Relative: 7 %
Neutro Abs: 9 10*3/uL — ABNORMAL HIGH (ref 1.7–7.7)
Neutrophils Relative %: 87 %
Platelets: 463 10*3/uL — ABNORMAL HIGH (ref 150–400)
RBC: 2.78 MIL/uL — ABNORMAL LOW (ref 4.22–5.81)
RDW: 13.8 % (ref 11.5–15.5)
WBC: 10.3 10*3/uL (ref 4.0–10.5)
nRBC: 0 % (ref 0.0–0.2)

## 2020-04-07 LAB — ACID FAST SMEAR (AFB, MYCOBACTERIA): Acid Fast Smear: NEGATIVE

## 2020-04-07 LAB — BASIC METABOLIC PANEL
Anion gap: 9 (ref 5–15)
BUN: 11 mg/dL (ref 6–20)
CO2: 26 mmol/L (ref 22–32)
Calcium: 8.7 mg/dL — ABNORMAL LOW (ref 8.9–10.3)
Chloride: 101 mmol/L (ref 98–111)
Creatinine, Ser: 0.64 mg/dL (ref 0.61–1.24)
GFR, Estimated: >60 mL/min (ref >60)
Glucose, Bld: 147 mg/dL — ABNORMAL HIGH (ref 70–99)
Potassium: 3.7 mmol/L (ref 3.5–5.1)
Sodium: 136 mmol/L (ref 135–145)

## 2020-04-07 LAB — SURGICAL PATHOLOGY

## 2020-04-07 LAB — CYTOLOGY - NON PAP

## 2020-04-07 LAB — CULTURE, RESPIRATORY W GRAM STAIN: Culture: NORMAL

## 2020-04-07 MED ORDER — PREDNISONE 20 MG PO TABS: 40.0000 mg | ORAL_TABLET | Freq: Every day | ORAL | Status: DC

## 2020-04-07 MED ORDER — PREDNISONE 20 MG PO TABS: ORAL_TABLET | ORAL | 0 refills | Status: AC

## 2020-04-07 MED ORDER — CYANOCOBALAMIN 1000 MCG PO TABS: 1000.0000 ug | ORAL_TABLET | Freq: Every day | ORAL | 0 refills | Status: DC

## 2020-04-07 MED ORDER — AMOXICILLIN-POT CLAVULANATE 875-125 MG PO TABS: 1.0000 | ORAL_TABLET | Freq: Two times a day (BID) | ORAL | 0 refills | Status: AC

## 2020-04-07 MED ORDER — PREDNISONE 20 MG PO TABS: 20.0000 mg | ORAL_TABLET | Freq: Every day | ORAL | Status: DC

## 2020-04-07 MED ORDER — AMOXICILLIN-POT CLAVULANATE 875-125 MG PO TABS
1.0000 | ORAL_TABLET | Freq: Two times a day (BID) | ORAL | Status: DC
  Administered 2020-04-07: 1 via ORAL
  Filled 2020-04-07: qty 1

## 2020-04-07 NOTE — Telephone Encounter (Signed)
-----   Message from Candee Furbish, MD sent at 04/07/2020  9:02 AM EST ----- Regarding: Followup 1-2 weeks, former Lenna Gilford patient, needs new MD Okay for NP if no MD slots. Reason for follow up is to review cytology and symptoms after recurrent pneumonia hospitalizations in patient with lung cancer on chemoradiation.  Linna Hoff

## 2020-04-07 NOTE — Telephone Encounter (Signed)
04/07/2020  Attempted to contact the patient.  Left voicemail for patient to contact her office back.  We will leave in triage for additional plan of contact.  Patient needs 30-minute hospital follow-up visit.  Patient also needs to establish with new pulmonologist such as Dr. Elsworth Soho or another provider on her team.  Wyn Quaker, FNP

## 2020-04-07 NOTE — Progress Notes (Signed)
D/C instructions given to patient. Medications reviewed. All questions reviewed. IV removed, clean and intact. Wife to escort pt home.  Clyde Canterbury, RN

## 2020-04-07 NOTE — Telephone Encounter (Signed)
Pt has been scheduled w/ Dr. Silas Flood on 12/20 @ 4 PM.

## 2020-04-07 NOTE — Progress Notes (Signed)
04/07/2020 Pulmonary Note I have seen and evaluated the patient for right sided pneumonitis.  S:  Feeling better today. On RA, able to walk in hallway. Denies pain.  O: Blood pressure 126/69, pulse 71, temperature 97.9 F (36.6 C), temperature source Oral, resp. rate 19, height _0  (1.778 m), weight 76.5 kg, SpO2 95 %.  Thin man in NAD Heart regular, ext warm Lungs with rhonci on R Ext warm, no edema Moves all four ext to command  A:  Recurrrent R sided pneumonia with leading differential being radiation vs. Infectious pneumonitis.  Lymphangitic spread less likely with active chemoradiation.  S/p bronchoscopy with BAL/Tbbx on 04/04/20 by Dr. Elsworth Soho.  Cytology still pending.  P:  - Progressive mobility encouraged - Prednisone 62m x 7 days followed by 246mday x 7 days - Augmentin x 7 days - Will arrange f/u in pulmonary clinic in 1-2 weeks to review cytology and check on clinical condition - PCCM available PRN   04/07/2020 DaErskine EmeryD

## 2020-04-07 NOTE — Discharge Summary (Signed)
Discharge Summary  Richard Li EQA:[id number] DOB: 1961-01-01  PCP: [name], DO  Admit date: 04/04/2020 Discharge date: 04/07/2020  Time spent: 40 mins  Recommendations for Outpatient Follow-up:  1. Follow-up with PCP in 1 week 2. Follow-up with pulmonology as scheduled  Discharge Diagnoses:  Active Hospital Problems   Diagnosis Date Noted  . Sepsis due to pneumonia ([facility]) 04/04/2020  . Protein-calorie malnutrition, severe 04/07/2020  . Generalized weakness 03/06/2020  . Multifocal pneumonia 03/05/2020  . Adenocarcinoma of right lung, stage 2 ([facility]) 01/17/2020  . Essential hypertension 06/03/2014  . GERD 01/27/2010  . 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[place]) 02/26/2007    Resolved Hospital Problems  No resolved problems to display.    Discharge Condition: Stable  Diet recommendation: Heart healthy  Vitals:   04/07/20 0435 04/07/20 0742  BP: 126/69 134/65  Pulse: 71 68  Resp: 19 18  Temp: 97.9 F (36.6 C) 97.8 F (36.6 C)  SpO2: 95% 100%    History of present illness:  Richard Swander Maynardis an 59 y.o.malewith stage II NSCLC, COPD, HTN who presents with recurrent shortness of breath and progressive weakness. This is the patient's fourth admission in the last 30 days for the same complaints. Patient was last discharged on 03/24/2020 after being managed for septic shock likely 2/2 multifocal pneumonia/aspiration pneumonia, was also noted to have decompensated HFpEF. Patient now presents complaining of increasing weakness, shortness of breath, persistent productive cough of frothy white phlegm for the past month.  In the ED, noted to be afebrile, chest x-ray showed worsened aeration of the right lung likely due to increased infiltrates, saturating well on room air, hypotensive, received 3 L of IV fluid, started on vancomycin and cefepime.  PCCM consulted.  Patient admitted for further management.    Today, patient denies any new  complaints, still having productive cough but denies it worsening, denies any chest pain, shortness of breath, nausea/vomiting, fever/chills.  Patient able to ambulate without any difficulties or dyspnea.  Hospital Course:  Principal Problem:   Sepsis due to pneumonia [facility]) Active Problems:   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H[facility])   GERD   Essential hypertension   Adenocarcinoma of right lung, stage 2 (HCC)   Multifocal pneumonia   Generalized weakness   Protein-calorie malnutrition, severe  Sepsis secondary to ??recurrent Multifocal Pneumonia Currently afebrile, with no leukocytosis, saturating well on room air Patient does meet sepsis criteria, leukocytosis, tachycardia, and tachypnea  BC x2 NGTD UA negative Chest x-ray showed substantially worsened aeration in the right lung, which in large part appears to reflect worsening atelectasis, although there is also likely worsening airspace disease as well PCCM consulted, s/p bronchoscopy on 04/05/20, respiratory culture, BAL samples taken pending.  Patient advised to follow-up with pulmonary, appointment set up S/P cefepime, + Vanc, switch to p.o. Augmentin for a total of 7 days Discharge on p.o. steroid for total of 14 days (for possible radiation pneumonitis) Follow-up with PCP, pulmonology  Normocytic anemia/anemia of chronic disease Vitamin B12 deficiency Baseline hemoglobin around 8 Anemia panel showed iron 21, sats 21, vitamin B12-->225, folate 10.4, ferritin 853 Transfused 1 unit of PRBC on 04/05/2020 Discharge on Vitamin L79, folic acid supplementation Follow-up with PCP, oncology  Chronic diastolic HF Appears euvolemic Echo done on 02/2020 showed EF of 60 to 65%, no regional wall motion abnormalities, grade 1 diastolic dysfunction Continue home losartan  Hypertension BP stable Continue losartan  Stage II lung adenocarcinoma Status post chemoradiation Continue outpatient  follow-up with Dr. Earlie Server  Adult failure to thrive/deconditioned Nutrition consult requested PT/OT  Anxiety and depression Continue lorazepam and trazodone        Malnutrition Type:  Nutrition Problem: Severe Malnutrition Etiology: chronic illness (stg II NSCLC, mult hospitalizations)   Malnutrition Characteristics:  Signs/Symptoms: energy intake < or equal to 75% for > or equal to 1 month,percent weight loss,moderate fat depletion,moderate muscle depletion   Nutrition Interventions:  Interventions: Refer to RD note for recommendations   Estimated body mass index is 24.21 kg/m as calculated from the following:   Height as of this encounter: _0  (1.778 m).   Weight as of this encounter: 76.5 kg.    Procedures:  Bronchoscopy  Consultations:  PCCM  Discharge Exam: BP 134/65 (BP Location: Left Arm)   Pulse 68   Temp 97.8 F (36.6 C) (Oral)   Resp 18   Ht _1  (1.778 m)   Wt 76.5 kg   SpO2 100%   BMI 24.21 kg/m   General: NAD, chronically ill-appearing Cardiovascular: S1, S2 present Respiratory: Coarse breath sounds on the right, diminished bilaterally    Discharge Instructions You were cared for by a hospitalist during your hospital stay. If you have any questions about your discharge medications or the care you received while you were in the hospital after you are discharged, you can call the unit and asked to speak with the hospitalist on call if the hospitalist that took care of you is not available. Once you are discharged, your primary care physician will handle any further medical issues. Please note that NO REFILLS for any discharge medications will be authorized once you are discharged, as it is imperative that you return to your primary care physician (or establish a relationship with a primary care physician if you do not have one) for your aftercare needs so that they can reassess your need for medications and monitor your lab  values.  Discharge Instructions    Diet - low sodium heart healthy   Complete by: As directed    Increase activity slowly   Complete by: As directed      Allergies as of 04/07/2020   No Known Allergies     Medication List    STOP taking these medications   methylPREDNISolone 4 MG Tbpk tablet Commonly known as: MEDROL DOSEPAK   potassium chloride SA 20 MEQ tablet Commonly known as: KLOR-CON     TAKE these medications   acetaminophen 500 MG tablet Commonly known as: TYLENOL Take 1,000 mg by mouth every 6 (six) hours as needed for mild pain.   amoxicillin-clavulanate 875-125 MG tablet Commonly known as: AUGMENTIN Take 1 tablet by mouth every 12 (twelve) hours for 7 days.   Combivent Respimat 20-100 MCG/ACT Aers respimat Generic drug: Ipratropium-Albuterol Inhale 1-2 puffs into the lungs every 6 (six) hours as needed for wheezing.   cyanocobalamin 1000 MCG tablet Take 1 tablet (1,000 mcg total) by mouth daily. Start taking on: April 08, 2020   dextromethorphan 30 MG/5ML liquid Commonly known as: DELSYM Take 30 mg by mouth at bedtime as needed for cough.   dextromethorphan-guaiFENesin 30-600 MG 12hr tablet Commonly known as: MUCINEX DM Take 1 tablet by mouth 2 (two) times daily.   fenofibrate 160 MG tablet Take 1 tablet (160 mg total) by mouth daily.   folic acid 1 MG tablet Commonly known as: FOLVITE Take 1 tablet (1 mg total) by mouth daily.   LORazepam 1 MG tablet Commonly known as: ATIVAN TAKE 1  TABLET BY MOUTH THREE TIMES DAILY AS NEEDED FOR ANXIETY What changed:   how much to take  how to take this  when to take this  additional instructions   losartan 100 MG tablet Commonly known as: COZAAR Take 1 tablet (100 mg total) by mouth daily.   multivitamin with minerals Tabs tablet Take 1 tablet by mouth daily.   predniSONE 20 MG tablet Commonly known as: DELTASONE Take 2 tablets (40 mg total) by mouth daily with breakfast for 7 days, THEN 1  tablet (20 mg total) daily with breakfast for 7 days. Start taking on: April 07, 2020   prochlorperazine 10 MG tablet Commonly known as: COMPAZINE TAKE 1 TABLET BY MOUTH EVERY 6 HOURS AS NEEDED FOR NAUSEA FOR VOMITING What changed: See the new instructions.   traZODone 50 MG tablet Commonly known as: DESYREL TAKE 1 TO 2 TABLETS BY MOUTH AT BEDTIME AS NEEDED FOR SLEEP What changed:   how much to take  when to take this  additional instructions      No Known Allergies  Follow-up Information    Lauraine Rinne, NP Follow up in 2 week(s).   Specialty: Pulmonary Disease Why: We will call you with appt date/time Contact information: [place] [address] [phone]        [name], DO. Schedule an appointment as soon as possible for a visit in 1 week(s).   Specialty: Family Medicine Contact information: [place] [address] [phone]                The results of significant diagnostics from this hospitalization (including imaging, microbiology, ancillary and laboratory) are listed below for reference.    Significant Diagnostic Studies: DG Chest 2 View  Result Date: 03/20/2020 CLINICAL DATA:  Shortness of breath. Recent sepsis and hospitalization. EXAM: CHEST - 2 VIEW COMPARISON:  03/13/2020 FINDINGS: Extensive airspace disease throughout the right lung, similar to prior study. Small right pleural effusion. Patchy nodular opacities are seen in the left lung as seen on prior CT. Heart is normal size. No acute bony abnormality. IMPRESSION: Extensive airspace disease throughout the right lung concerning for pneumonia. Small right pleural effusion. Findings similar to prior study. Patchy vague ground-glass nodular opacities throughout the left lung as seen on prior CT. No real change since prior study. Electronically Signed   By: Rolm Baptise M.D.   On: 03/20/2020 10:42   CT Angio Chest PE W/Cm &/Or Wo  Cm  Result Date: 03/13/2020 CLINICAL DATA:  Fever. Hypotension. Anemia. Lung cancer. Ongoing radiation therapy and chemotherapy. EXAM: CT ANGIOGRAPHY CHEST WITH CONTRAST TECHNIQUE: Multidetector CT imaging of the chest was performed using the standard protocol during bolus administration of intravenous contrast. Multiplanar CT image reconstructions and MIPs were obtained to evaluate the vascular anatomy. CONTRAST:  42m OMNIPAQUE IOHEXOL 350 MG/ML SOLN COMPARISON:  Chest radiograph from earlier today. 01/16/2020 PET-CT. 12/20/2019 chest CT. FINDINGS: Cardiovascular: The study is moderate quality for the evaluation of pulmonary embolism, with some motion degradation. There are no convincing filling defects in the central, lobar, segmental or subsegmental pulmonary artery branches to suggest acute pulmonary embolism. Atherosclerotic nonaneurysmal thoracic aorta. Stable top-normal caliber main pulmonary artery (3.0 cm diameter). Normal heart size. Stable trace pericardial effusion/thickening. Mediastinum/Nodes: No discrete thyroid nodules. Unremarkable esophagus. No axillary adenopathy. Mildly enlarged 1.2 cm subcarinal node (series 4/image 75), new. Mildly enlarged 1.0 cm right hilar node (series 4/image 71) new. No left hilar adenopathy. Lungs/Pleura: No  pneumothorax. Small dependent bilateral pleural effusions, new bilaterally. Moderate centrilobular and paraseptal emphysema with diffuse bronchial wall thickening and saber sheath trachea. Extensive patchy consolidation and ground-glass opacity throughout the right lung involving all right lung lobes with associated air bronchograms, new since 01/16/2020 PET-CT, obscuring the margins of the cavitary right upper perihilar lung mass, which measures approximately 4.2 x 3.0 cm (series 10/image 58), not substantially changed since 01/16/2020 PET-CT. New patchy peribronchovascular irregular foci of consolidation and ground-glass opacity throughout left upper lobe.  Stable solid 0.6 cm posterior left upper lobe pulmonary nodule (series 10/image 88). Upper abdomen: No acute abnormality. Musculoskeletal:  No aggressive appearing focal osseous lesions. Review of the MIP images confirms the above findings. IMPRESSION: 1. No evidence of acute pulmonary embolism. 2. Extensive patchy consolidation and ground-glass opacity throughout the right greater than left lungs, new since 01/16/2020 PET-CT. Findings favor multilobar pneumonia, with differential including drug reaction. A portion of the right lung opacities may represent early post treatment change. Close chest CT follow-up recommended. 3. Cavitary perihilar right upper lung mass is obscured and is grossly stable. Stable subcentimeter solid left lower lobe pulmonary nodule. 4. New small dependent bilateral pleural effusions. 5. New mild subcarinal and right hilar lymphadenopathy, nonspecific, suggest attention on follow-up chest CT with IV contrast in 3 months. 6. Aortic Atherosclerosis (ICD10-I70.0) and Emphysema (ICD10-J43.9). Electronically Signed   By: Ilona Sorrel M.D.   On: 03/13/2020 14:26   DG CHEST PORT 1 VIEW  Result Date: 04/05/2020 CLINICAL DATA:  59 year old male with history of bronchoscopy and biopsy. EXAM: PORTABLE CHEST 1 VIEW COMPARISON:  Chest x-ray 04/04/2020. FINDINGS: Irregular opacities are again noted throughout the right mid to lower lung, with multiple nodular densities again noted in the left lung, similar to prior examinations. No definite pleural effusions. No evidence of pulmonary edema. Heart size is mildly enlarged. Mediastinal contours are distorted. IMPRESSION: 1. The overall appearance the chest is very similar to prior examinations, with presumed atelectasis and consolidation throughout the right mid to lower lung, and multiple metastatic nodules in the left lung. No new acute findings. Electronically Signed   By: Vinnie Langton M.D.   On: 04/05/2020 12:37   DG Chest Port 1  View  Result Date: 04/04/2020 CLINICAL DATA:  59 year old male with history of increasing shortness of breath. EXAM: PORTABLE CHEST 1 VIEW COMPARISON:  Chest x-ray 03/20/2020. FINDINGS: Significant worsening aeration in the right lung, which may reflect worsening atelectasis and/or consolidation, most evident throughout the right mid to lower lung. Slight rightward shift of cardiomediastinal structures, indicative of substantial right-sided volume loss. Left lung appears relatively clear, with exception of several nodular densities which could represent areas of infectious consolidation, or metastatic lesions. Probable small right pleural effusion. No definite left pleural effusion. No pneumothorax. Heart size is normal. IMPRESSION: 1. Substantially worsened aeration in the right lung, which in large part appears to reflect worsening atelectasis, although there is also likely worsening airspace disease as well. 2. The multiple left-sided pulmonary nodules are again noted, which could be infectious or neoplastic in etiology. Continued attention on follow-up studies is recommended. Electronically Signed   By: Vinnie Langton M.D.   On: 04/04/2020 10:53   DG Chest Port 1 View  Result Date: 03/13/2020 CLINICAL DATA:  Hypotension and fever.  Possible sepsis. EXAM: PORTABLE CHEST 1 VIEW COMPARISON:  Radiographs 03/05/2020 and 01/07/2020. CT 12/20/2019 and PET-CT 01/16/2020. FINDINGS: 1111 hours. The heart size and mediastinal contours are stable without gross adenopathy. There is progressive airspace  disease throughout the right lung with mild volume loss. This obscures the previously demonstrated cavitary lesion in the right upper lobe. The left lung is clear. There is no pleural effusion or pneumothorax. No acute osseous findings are seen. IMPRESSION: Progressive right lung airspace disease suspicious for pneumonia in this clinical setting. In this patient with recently diagnosed lung cancer, these findings  could relate to radiation therapy. Correlate clinically. Electronically Signed   By: Richardean Sale M.D.   On: 03/13/2020 11:43   DG Swallowing Func-Speech Pathology  Result Date: 03/23/2020 Objective Swallowing Evaluation: Type of Study: MBS-Modified Barium Swallow Study  Patient Details Name: REDA GETTIS MRN: [id number] Date of Birth: 13-Apr-1961 Today's Date: 03/23/2020 Time: SLP Start Time (ACUTE ONLY): 6387 -SLP Stop Time (ACUTE ONLY): 1320 SLP Time Calculation (min) (ACUTE ONLY): 25 min Past Medical History: Past Medical History: Diagnosis Date . Allergic rhinitis, cause unspecified  . Anxiety state, unspecified  . Asthma   as a child . Chronic airway obstruction, not elsewhere classified  . Esophageal reflux  . History of kidney stones  . Hypertension  . Lumbago  . Other and unspecified hyperlipidemia  . Other chest pain  Past Surgical History: Past Surgical History: Procedure Laterality Date . APPENDECTOMY   . BRONCHIAL BIOPSY  01/07/2020  Procedure: BRONCHIAL BIOPSIES;  Surgeon: Collene Gobble, MD;  Location: [facility] ENDOSCOPY;  Service: Pulmonary;; . BRONCHIAL BRUSHINGS  01/07/2020  Procedure: BRONCHIAL BRUSHINGS;  Surgeon: Collene Gobble, MD;  Location: [facility] ENDOSCOPY;  Service: Pulmonary;; . BRONCHIAL NEEDLE ASPIRATION BIOPSY  01/07/2020  Procedure: BRONCHIAL NEEDLE ASPIRATION BIOPSIES;  Surgeon: Collene Gobble, MD;  Location: [facility] ENDOSCOPY;  Service: Pulmonary;; . BRONCHIAL WASHINGS  01/07/2020  Procedure: BRONCHIAL WASHINGS;  Surgeon: Collene Gobble, MD;  Location: [facility] ENDOSCOPY;  Service: Pulmonary;; . COLONOSCOPY  15 years ago . HEMOSTASIS CONTROL  01/07/2020  Procedure: HEMOSTASIS CONTROL;  Surgeon: Collene Gobble, MD;  Location: [facility] ENDOSCOPY;  Service: Pulmonary;;  cold saline . NASAL TURBINATE REDUCTION  2002  Dr.Crossley . VASECTOMY   . VIDEO BRONCHOSCOPY WITH ENDOBRONCHIAL NAVIGATION N/A 01/07/2020  Procedure: VIDEO BRONCHOSCOPY WITH ENDOBRONCHIAL NAVIGATION;  Surgeon: Collene Gobble, MD;   Location: [facility] ENDOSCOPY;  Service: Pulmonary;  Laterality: N/A; . VIDEO BRONCHOSCOPY WITH ENDOBRONCHIAL ULTRASOUND N/A 01/07/2020  Procedure: VIDEO BRONCHOSCOPY WITH ENDOBRONCHIAL ULTRASOUND;  Surgeon: Collene Gobble, MD;  Location: [facility] ENDOSCOPY;  Service: Pulmonary;  Laterality: N/A; HPI: This is a 59 year old male with past medical history of NSCLC, asthma, hypertension, recent admission for septic shock secondary to multifocal pneumonia from 11/19-11/23 who presented to the ED with persistent shortness of breath, cough and generalized weakness. CXR 03/20/20: Extensive airspace disease throughout the right lung concerning for pneumonia versus changes from radiation treatment. Small right pleural effusion. Findings similar to prior study.  Subjective: pleasant, alert, just had radiation treatment at 11:45am Assessment / Plan / Recommendation CHL IP CLINICAL IMPRESSIONS 03/23/2020 Clinical Impression Patient presents with a mild oral and a mild-moderate pharyngeal dysphagia which appears to be sensorimotor based. Patient has been with dry mouth and sore throat from coughing which he stated has resulted in him not eating that much lately. During oral phase, patient exhbiited decreased anterior to posterior transit of puree and regular texture boluses as well as 1/2 barium tablet (patient requested to have it split in half). During pharyngeal phase, patient had decreased hyolaryngeal elevation with decreased pharyngeal contraction leading to moderate vallecular sinus residuals with puree solids and regular solids, mild pyriform sinus residuals with puree  solids, regular solids and thin liquds. Majority of vallecular residuals cleared with sips of thin liquids and extra/dry swallows. Patient did exhibit one instance of flash penetration with thin liquids of trace amount, as well as silent penetration of trace amount of thin liquids that led to deep penetration that was also not sensed. No aspiration was observed.  Pyriform sinus residuals were also impacted by patient's decreased UES opening which was observed with all consistencies. SLP Visit Diagnosis Dysphagia, oropharyngeal phase (R13.12) Attention and concentration deficit following -- Frontal lobe and executive function deficit following -- Impact on safety and function Mild aspiration risk;Risk for inadequate nutrition/hydration   CHL IP TREATMENT RECOMMENDATION 03/23/2020 Treatment Recommendations Therapy as outlined in treatment plan below   Prognosis 03/23/2020 Prognosis for Safe Diet Advancement Good Barriers to Reach Goals -- Barriers/Prognosis Comment -- CHL IP DIET RECOMMENDATION 03/23/2020 SLP Diet Recommendations Regular solids;Thin liquid Liquid Administration via Cup;Straw Medication Administration Whole meds with liquid Compensations Small sips/bites;Slow rate;Follow solids with liquid Postural Changes Seated upright at 90 degrees   CHL IP OTHER RECOMMENDATIONS 03/23/2020 Recommended Consults -- Oral Care Recommendations Oral care BID;Patient independent with oral care Other Recommendations --   CHL IP FOLLOW UP RECOMMENDATIONS 03/23/2020 Follow up Recommendations Other (comment)   CHL IP FREQUENCY AND DURATION 03/23/2020 Speech Therapy Frequency (ACUTE ONLY) min 2x/week Treatment Duration 1 week      CHL IP ORAL PHASE 03/23/2020 Oral Phase Impaired Oral - Pudding Teaspoon -- Oral - Pudding Cup -- Oral - Honey Teaspoon -- Oral - Honey Cup -- Oral - Nectar Teaspoon -- Oral - Nectar Cup -- Oral - Nectar Straw -- Oral - Thin Teaspoon -- Oral - Thin Cup -- Oral - Thin Straw -- Oral - Puree Reduced posterior propulsion;Delayed oral transit;Weak lingual manipulation Oral - Mech Soft -- Oral - Regular Weak lingual manipulation;Delayed oral transit;Reduced posterior propulsion Oral - Multi-Consistency -- Oral - Pill Reduced posterior propulsion;Delayed oral transit;Weak lingual manipulation;Decreased bolus cohesion Oral Phase - Comment --  CHL IP PHARYNGEAL PHASE  03/23/2020 Pharyngeal Phase Impaired Pharyngeal- Pudding Teaspoon -- Pharyngeal -- Pharyngeal- Pudding Cup -- Pharyngeal -- Pharyngeal- Honey Teaspoon -- Pharyngeal -- Pharyngeal- Honey Cup -- Pharyngeal -- Pharyngeal- Nectar Teaspoon -- Pharyngeal -- Pharyngeal- Nectar Cup -- Pharyngeal -- Pharyngeal- Nectar Straw -- Pharyngeal -- Pharyngeal- Thin Teaspoon -- Pharyngeal -- Pharyngeal- Thin Cup Penetration/Aspiration during swallow;Penetration/Apiration after swallow;Pharyngeal residue - pyriform;Compensatory strategies attempted (with notebox) Pharyngeal Material enters airway, remains ABOVE vocal cords then ejected out;Material enters airway, remains ABOVE vocal cords and not ejected out Pharyngeal- Thin Straw Penetration/Aspiration during swallow;Penetration/Apiration after swallow;Pharyngeal residue - pyriform;Reduced anterior laryngeal mobility Pharyngeal Material enters airway, remains ABOVE vocal cords then ejected out;Material enters airway, remains ABOVE vocal cords and not ejected out Pharyngeal- Puree Reduced pharyngeal peristalsis;Reduced tongue base retraction;Pharyngeal residue - valleculae;Pharyngeal residue - pyriform;Reduced anterior laryngeal mobility Pharyngeal -- Pharyngeal- Mechanical Soft -- Pharyngeal -- Pharyngeal- Regular Reduced tongue base retraction;Pharyngeal residue - valleculae;Pharyngeal residue - pyriform;Reduced pharyngeal peristalsis;Reduced anterior laryngeal mobility Pharyngeal -- Pharyngeal- Multi-consistency -- Pharyngeal -- Pharyngeal- Pill Delayed swallow initiation-vallecula;Reduced anterior laryngeal mobility Pharyngeal -- Pharyngeal Comment --  CHL IP CERVICAL ESOPHAGEAL PHASE 03/23/2020 Cervical Esophageal Phase Impaired Pudding Teaspoon -- Pudding Cup -- Honey Teaspoon -- Honey Cup -- Nectar Teaspoon -- Nectar Cup -- Nectar Straw -- Thin Teaspoon -- Thin Cup Reduced cricopharyngeal relaxation Thin Straw Reduced cricopharyngeal relaxation Puree Reduced cricopharyngeal  relaxation Mechanical Soft -- Regular Reduced cricopharyngeal relaxation Multi-consistency -- Pill Reduced cricopharyngeal relaxation Cervical Esophageal Comment -- Jenny Reichmann  Earline Mayotte, MA, CCC-SLP Speech Therapy             ECHOCARDIOGRAM COMPLETE  Result Date: 03/21/2020    ECHOCARDIOGRAM REPORT   Patient Name:   DEEGAN VALENTINO Date of Exam: 03/21/2020 Medical Rec #:  [id number]       Height:       70.5 in Accession #:    [id number]      Weight:       190.9 lb Date of Birth:  [birth year]       BSA:          2.057 m Patient Age:    56 years        BP:           113/72 mmHg Patient Gender: M               HR:           89 bpm. Exam Location:  Inpatient Procedure: 2D Echo, Cardiac Doppler, Color Doppler and Intracardiac            Opacification Agent Indications:    Dyspnea 786.09 / R06.00  History:        Patient has no prior history of Echocardiogram examinations.                 Risk Factors:Hypertension, Dyslipidemia and Former Smoker.  Sonographer:    Vickie Epley RDCS Referring Phys: [id number] Harold Hedge  Sonographer Comments: Suboptimal parasternal window. IMPRESSIONS  1. Left ventricular ejection fraction, by estimation, is 60 to 65%. The left ventricle has normal function. The left ventricle has no regional wall motion abnormalities. Left ventricular diastolic parameters are consistent with Grade I diastolic dysfunction (impaired relaxation). Elevated left ventricular end-diastolic pressure.  2. Right ventricular systolic function is normal. The right ventricular size is normal.  3. A small pericardial effusion is present. The pericardial effusion is posterior to the left ventricle. There is no evidence of cardiac tamponade.  4. The mitral valve is grossly normal. Trivial mitral valve regurgitation.  5. The aortic valve was not well visualized. Aortic valve regurgitation is not visualized.  6. Aortic dilatation noted. There is borderline dilatation of the ascending aorta, measuring 38 mm. There is mild  dilatation of the aortic root, measuring 41 mm.  7. The inferior vena cava is normal in size with <50% respiratory variability, suggesting right atrial pressure of 8 mmHg. FINDINGS  Left Ventricle: Left ventricular ejection fraction, by estimation, is 60 to 65%. The left ventricle has normal function. The left ventricle has no regional wall motion abnormalities. Definity contrast agent was given IV to delineate the left ventricular  endocardial borders. The left ventricular internal cavity size was normal in size. There is no left ventricular hypertrophy. Left ventricular diastolic parameters are consistent with Grade I diastolic dysfunction (impaired relaxation). Elevated left ventricular end-diastolic pressure. Right Ventricle: The right ventricular size is normal. No increase in right ventricular wall thickness. Right ventricular systolic function is normal. Left Atrium: Left atrial size was normal in size. Right Atrium: Right atrial size was normal in size. Pericardium: A small pericardial effusion is present. The pericardial effusion is posterior to the left ventricle. There is no evidence of cardiac tamponade. Mitral Valve: The mitral valve is grossly normal. Trivial mitral valve regurgitation. Tricuspid Valve: The tricuspid valve is grossly normal. Tricuspid valve regurgitation is trivial. Aortic Valve: The aortic valve was not well visualized. Aortic valve regurgitation is not visualized. Pulmonic Valve: The pulmonic valve was  grossly normal. Pulmonic valve regurgitation is not visualized. Aorta: Aortic dilatation noted. There is borderline dilatation of the ascending aorta, measuring 38 mm. There is mild dilatation of the aortic root, measuring 41 mm. Venous: The inferior vena cava is normal in size with less than 50% respiratory variability, suggesting right atrial pressure of 8 mmHg. IAS/Shunts: No atrial level shunt detected by color flow Doppler.  LEFT VENTRICLE PLAX 2D LVIDd:         5.10 cm       Diastology LVIDs:         3.90 cm      LV e' medial:    7.38 cm/s LV PW:         0.70 cm      LV E/e' medial:  18.6 LV IVS:        0.70 cm      LV e' lateral:   8.44 cm/s LVOT diam:     2.50 cm      LV E/e' lateral: 16.2 LV SV:         129 LV SV Index:   63 LVOT Area:     4.91 cm  LV Volumes (MOD) LV vol d, MOD A2C: 190.0 ml LV vol d, MOD A4C: 180.0 ml LV vol s, MOD A2C: 66.1 ml LV vol s, MOD A4C: 62.7 ml LV SV MOD A2C:     123.9 ml LV SV MOD A4C:     180.0 ml LV SV MOD BP:      121.8 ml RIGHT VENTRICLE RV S prime:     10.20 cm/s TAPSE (M-mode): 2.1 cm LEFT ATRIUM             Index       RIGHT ATRIUM           Index LA diam:        4.50 cm 2.19 cm/m  RA Area:     12.50 cm LA Vol (A2C):   52.2 ml 25.37 ml/m RA Volume:   25.70 ml  12.49 ml/m LA Vol (A4C):   48.1 ml 23.38 ml/m LA Biplane Vol: 51.3 ml 24.94 ml/m  AORTIC VALVE LVOT Vmax:   113.00 cm/s LVOT Vmean:  77.300 cm/s LVOT VTI:    0.263 m  AORTA Ao Root diam: 3.90 cm Ao Asc diam:  3.80 cm MITRAL VALVE MV Area (PHT): 5.13 cm     SHUNTS MV Decel Time: 148 msec     Systemic VTI:  0.26 m MV E velocity: 137.00 cm/s  Systemic Diam: 2.50 cm MV A velocity: 95.50 cm/s MV E/A ratio:  1.43 Lyman Bishop MD Electronically signed by Lyman Bishop MD Signature Date/Time: 03/21/2020/12:33:26 PM    Final    DG C-ARM BRONCHOSCOPY  Result Date: 04/05/2020 C-ARM BRONCHOSCOPY: Fluoroscopy was utilized by the requesting physician.  No radiographic interpretation.    Microbiology: Recent Results (from the past 240 hour(s))  Blood Culture (routine x 2)     Status: None (Preliminary result)   Collection Time: 04/04/20 10:56 AM   Specimen: BLOOD  Result Value Ref Range Status   Specimen Description BLOOD BLOOD RIGHT HAND  Final   Special Requests   Final    BOTTLES DRAWN AEROBIC AND ANAEROBIC Blood Culture adequate volume   Culture   Final    NO GROWTH 2 DAYS Performed at [facility], [address] [place], [place] [address]    Report Status PENDING   Incomplete  Blood Culture (routine x 2)  Status: None (Preliminary result)   Collection Time: 04/04/20 11:12 AM   Specimen: BLOOD  Result Value Ref Range Status   Specimen Description BLOOD RIGHT ANTECUBITAL  Final   Special Requests   Final    BOTTLES DRAWN AEROBIC AND ANAEROBIC Blood Culture adequate volume   Culture   Final    NO GROWTH 2 DAYS Performed at [facility], [address] [place], [place] [address]    Report Status PENDING  Incomplete  Resp Panel by RT-PCR (Flu A&B, Covid) Nasopharyngeal Swab     Status: None   Collection Time: 04/04/20 11:13 AM   Specimen: Nasopharyngeal Swab; Nasopharyngeal(NP) swabs in vial transport medium  Result Value Ref Range Status   SARS Coronavirus 2 by RT PCR NEGATIVE NEGATIVE Final    Comment: (NOTE) SARS-CoV-2 target nucleic acids are NOT DETECTED.  The SARS-CoV-2 RNA is generally detectable in upper respiratory specimens during the acute phase of infection. The lowest concentration of SARS-CoV-2 viral copies this assay can detect is 138 copies/mL. A negative result does not preclude SARS-Cov-2 infection and should not be used as the sole basis for treatment or other patient management decisions. A negative result may occur with  improper specimen collection/handling, submission of specimen other than nasopharyngeal swab, presence of viral mutation(s) within the areas targeted by this assay, and inadequate number of viral copies(<138 copies/mL). A negative result must be combined with clinical observations, patient history, and epidemiological information. The expected result is Negative.  Fact Sheet for Patients:  EntrepreneurPulse.com.au  Fact Sheet for Healthcare Providers:  IncredibleEmployment.be  This test is no t yet approved or cleared by the [place] FDA and  has been authorized for detection and/or diagnosis of SARS-CoV-2 by FDA under an Emergency Use Authorization  (EUA). This EUA will remain  in effect (meaning this test can be used) for the duration of the COVID-19 declaration under Section 564(b)(1) of the Act, 21 U.S.C.section 360bbb-3(b)(1), unless the authorization is terminated  or revoked sooner.       Influenza A by PCR NEGATIVE NEGATIVE Final   Influenza B by PCR NEGATIVE NEGATIVE Final    Comment: (NOTE) The Xpert Xpress SARS-CoV-2/FLU/RSV plus assay is intended as an aid in the diagnosis of influenza from Nasopharyngeal swab specimens and should not be used as a sole basis for treatment. Nasal washings and aspirates are unacceptable for Xpert Xpress SARS-CoV-2/FLU/RSV testing.  Fact Sheet for Patients: EntrepreneurPulse.com.au  Fact Sheet for Healthcare Providers: IncredibleEmployment.be  This test is not yet approved or cleared by the [place] FDA and has been authorized for detection and/or diagnosis of SARS-CoV-2 by FDA under an Emergency Use Authorization (EUA). This EUA will remain in effect (meaning this test can be used) for the duration of the COVID-19 declaration under Section 564(b)(1) of the Act, 21 U.S.C. section 360bbb-3(b)(1), unless the authorization is terminated or revoked.  Performed at [facility], [place] [address], [place], [place] [address]   Urine culture     Status: None   Collection Time: 04/04/20 10:17 PM   Specimen: In/Out Cath Urine  Result Value Ref Range Status   Specimen Description IN/OUT CATH URINE  Final   Special Requests NONE  Final   Culture   Final    NO GROWTH Performed at [facility], [place] [address], [place], [place] [address]    Report Status 04/06/2020 FINAL  Final  MRSA PCR Screening     Status: None   Collection Time: 04/04/20  10:17 PM   Specimen: Nasopharyngeal  Result Value Ref Range Status   MRSA by PCR NEGATIVE NEGATIVE Final    Comment:        The GeneXpert MRSA Assay (FDA approved for NASAL specimens only), is one  component of a comprehensive MRSA colonization surveillance program. It is not intended to diagnose MRSA infection nor to guide or monitor treatment for MRSA infections. Performed at [facility], [place] [address], [place], [place] [address]   Culture, fungus without smear     Status: None (Preliminary result)   Collection Time: 04/05/20 11:26 AM   Specimen: Bronchial Washing, Right; Lung  Result Value Ref Range Status   Specimen Description BRONCHIAL ALVEOLAR LAVAGE  Final   Special Requests [facility]  Final   Culture   Final    NO FUNGUS ISOLATED AFTER 1 DAY Performed at [facility], [place] [address], [place], [place] [address]    Report Status PENDING  Incomplete  Culture, respiratory     Status: None (Preliminary result)   Collection Time: 04/05/20 11:26 AM   Specimen: Bronchial Washing, Right; Lung  Result Value Ref Range Status   Specimen Description BRONCHIAL ALVEOLAR LAVAGE  Final   Special Requests [facility]  Final   Gram Stain   Final    ABUNDANT WBC PRESENT,BOTH PMN AND MONONUCLEAR RARE GRAM POSITIVE COCCI IN PAIRS    Culture   Final    CULTURE REINCUBATED FOR BETTER GROWTH Performed at [facility], [place] [address] [place], [place] [address]    Report Status PENDING  Incomplete     Labs: Basic Metabolic Panel: Recent Labs  Lab 04/04/20 1040 04/04/20 1913 04/05/20 0218 04/05/20 0741 04/06/20 0834 04/07/20 0134  NA 134*  --  133* 136 137 136  K 3.7  --  5.4* 4.1 3.9 3.7  CL 96*  --  101 102 101 101  CO2 25  --  20* _0 GLUCOSE 133*  --  138* 98 117* 147*  BUN 19  --  _1 CREATININE 1.16 0.84 0.70 0.57* 0.62 0.64  CALCIUM 9.3  --  8.4* 8.2* 8.7* 8.7*   Liver Function Tests: No results for input(s): AST, ALT, ALKPHOS, BILITOT, PROT, ALBUMIN in the last 168 hours. No results for input(s): LIPASE, AMYLASE in the last 168 hours. No results for input(s): AMMONIA in the last 168 hours. CBC: Recent Labs  Lab 04/04/20 1040  04/04/20 1913 04/05/20 0218 04/05/20 2016 04/06/20 0834 04/07/20 0134  WBC 17.1* 14.3* 9.9  --  10.3 10.3  NEUTROABS  --   --   --   --  9.4* 9.0*  HGB 9.5* 8.2* 7.0* 9.2* 8.4* 8.2*  HCT 28.8* 25.4* 20.8* 28.4* 25.3* 24.1*  MCV 89.4 88.5 87.4  --  86.6 86.7  PLT 853* 608* 480*  --  484* 463*   Cardiac Enzymes: No results for input(s): CKTOTAL, CKMB, CKMBINDEX, TROPONINI in the last 168 hours. BNP: BNP (last 3 results) No results for input(s): BNP in the last 8760 hours.  ProBNP (last 3 results) No results for input(s): PROBNP in the last 8760 hours.  CBG: No results for input(s): GLUCAP in the last 168 hours.     Signed:  Alma Friendly, MD [facility] 04/07/2020, 11:02 AM

## 2020-04-10 LAB — CULTURE, BLOOD (ROUTINE X 2)
Culture: NO GROWTH
Culture: NO GROWTH
Special Requests: ADEQUATE
Special Requests: ADEQUATE

## 2020-04-13 ENCOUNTER — Ambulatory Visit: Payer: BC Managed Care – PPO | Admitting: Pulmonary Disease

## 2020-04-13 ENCOUNTER — Inpatient Hospital Stay: Payer: BC Managed Care – PPO | Admitting: Family Medicine

## 2020-04-13 ENCOUNTER — Encounter: Payer: Self-pay | Admitting: Pulmonary Disease

## 2020-04-13 ENCOUNTER — Other Ambulatory Visit: Payer: Self-pay

## 2020-04-13 VITALS — BP 118/78 | HR 120 | Temp 97.8°F

## 2020-04-13 DIAGNOSIS — J189 Pneumonia, unspecified organism: Secondary | ICD-10-CM | POA: Diagnosis not present

## 2020-04-13 MED ORDER — AMOXICILLIN-POT CLAVULANATE 875-125 MG PO TABS
1.0000 | ORAL_TABLET | Freq: Two times a day (BID) | ORAL | 0 refills | Status: AC
Start: 2020-04-13 — End: 2020-04-27

## 2020-04-13 NOTE — Patient Instructions (Signed)
Nice to meet you  Continue Augmentin for 14 more days - new script sent  CT chest ordered - will try to coordinate with visit 12/29  Return to clinic in 4 weeks with Dr. Silas Flood

## 2020-04-14 NOTE — Progress Notes (Signed)
_0  ID: Richard Li, male    DOB: May 06, 1960, 59 y.o.   MRN: [id number]  Chief Complaint  Patient presents with  . Hospitalization Follow-up    HFU after having bronch done on 04/05/20. States SOB has gotten worse since being home. Productive cough with lime green phlegm. Increased fatigue.     Referring provider: [name], DO  HPI:   59 year old man with recently diagnosed right-sided lung cancer 12/2019 via bronchoscopy/EBUS who spent essentially all of November in the hospital for recurrent right-sided pneumonia whom are seeing in hospital follow-up.  Extensive notes from multiple hospitalizations reviewed.  Started with productive cough it seems early November.  This is prompted 4 separate hospitalizations and multiple antibiotic courses.  He had recently completed first cycle of carboplatin/paclitaxel.  Was in the midst of radiation therapy to the right upper lobe mass which he completed around Thanksgiving 2021.  He has had cough productive of green sputum.  It is foul tasting.  Smells bad.  Despite multiple rounds of antibiotic is not improved.  His shortness of breath is worsening.  The most recent hospitalization 03/2020 a bronchoscopy was pursued. Mucopurulent secretions noted emanating from RUL. Secretions likely aspirated seen in BI, RLL. Culture with OP flora. No cell counts sent.  Continually coughing, producing sputum as above. Worried because not getting chemotherapy for cancer. Recently completed coures 14 day prednisone for possible radiation pneumonitis. This did not help symptoms. Finishing course of augmentin. Not seen improvement.  Serial CXRs reviewed which show persistent and worsening R mid and upper lung field opacities. CT 02/2020 reviewed with necrotic appearing mass near RUL takeoff centrally, dense consolidations posterior RUL and throughout R with scattered GGOs in left lung.   Questionaires / Pulmonary Flowsheets:   ACT:  No flowsheet data  found.  MMRC: No flowsheet data found.  Epworth:  No flowsheet data found.  Tests:   FENO:  No results found for: NITRICOXIDE  PFT: No flowsheet data found.  WALK:  No flowsheet data found.  Imaging: DG Chest 2 View  Result Date: 03/20/2020 CLINICAL DATA:  Shortness of breath. Recent sepsis and hospitalization. EXAM: CHEST - 2 VIEW COMPARISON:  03/13/2020 FINDINGS: Extensive airspace disease throughout the right lung, similar to prior study. Small right pleural effusion. Patchy nodular opacities are seen in the left lung as seen on prior CT. Heart is normal size. No acute bony abnormality. IMPRESSION: Extensive airspace disease throughout the right lung concerning for pneumonia. Small right pleural effusion. Findings similar to prior study. Patchy vague ground-glass nodular opacities throughout the left lung as seen on prior CT. No real change since prior study. Electronically Signed   By: Richard Li M.D.   On: 03/20/2020 10:42   DG CHEST PORT 1 VIEW  Result Date: 04/05/2020 CLINICAL DATA:  59 year old male with history of bronchoscopy and biopsy. EXAM: PORTABLE CHEST 1 VIEW COMPARISON:  Chest x-ray 04/04/2020. FINDINGS: Irregular opacities are again noted throughout the right mid to lower lung, with multiple nodular densities again noted in the left lung, similar to prior examinations. No definite pleural effusions. No evidence of pulmonary edema. Heart size is mildly enlarged. Mediastinal contours are distorted. IMPRESSION: 1. The overall appearance the chest is very similar to prior examinations, with presumed atelectasis and consolidation throughout the right mid to lower lung, and multiple metastatic nodules in the left lung. No new acute findings. Electronically Signed   By: Richard Li M.D.   On: 04/05/2020 12:37   DG Chest Port 1  View  Result Date: 04/04/2020 CLINICAL DATA:  59 year old male with history of increasing shortness of breath. EXAM: PORTABLE CHEST 1 VIEW  COMPARISON:  Chest x-ray 03/20/2020. FINDINGS: Significant worsening aeration in the right lung, which may reflect worsening atelectasis and/or consolidation, most evident throughout the right mid to lower lung. Slight rightward shift of cardiomediastinal structures, indicative of substantial right-sided volume loss. Left lung appears relatively clear, with exception of several nodular densities which could represent areas of infectious consolidation, or metastatic lesions. Probable small right pleural effusion. No definite left pleural effusion. No pneumothorax. Heart size is normal. IMPRESSION: 1. Substantially worsened aeration in the right lung, which in large part appears to reflect worsening atelectasis, although there is also likely worsening airspace disease as well. 2. The multiple left-sided pulmonary nodules are again noted, which could be infectious or neoplastic in etiology. Continued attention on follow-up studies is recommended. Electronically Signed   By: Richard Li M.D.   On: 04/04/2020 10:53   DG Swallowing Func-Speech Pathology  Result Date: 03/23/2020 Objective Swallowing Evaluation: Type of Study: MBS-Modified Barium Swallow Study  Patient Details Name: Richard Li MRN: [id number] Date of Birth: March 07, 1961 Today's Date: 03/23/2020 Time: SLP Start Time (ACUTE ONLY): 1660 -SLP Stop Time (ACUTE ONLY): 1320 SLP Time Calculation (min) (ACUTE ONLY): 25 min Past Medical History: Past Medical History: Diagnosis Date . Allergic rhinitis, cause unspecified  . Anxiety state, unspecified  . Asthma   as a child . Chronic airway obstruction, not elsewhere classified  . Esophageal reflux  . History of kidney stones  . Hypertension  . Lumbago  . Other and unspecified hyperlipidemia  . Other chest pain  Past Surgical History: Past Surgical History: Procedure Laterality Date . APPENDECTOMY   . BRONCHIAL BIOPSY  01/07/2020  Procedure: BRONCHIAL BIOPSIES;  Surgeon: Richard Gobble, MD;  Location: [facility]  ENDOSCOPY;  Service: Pulmonary;; . BRONCHIAL BRUSHINGS  01/07/2020  Procedure: BRONCHIAL BRUSHINGS;  Surgeon: Richard Gobble, MD;  Location: [facility] ENDOSCOPY;  Service: Pulmonary;; . BRONCHIAL NEEDLE ASPIRATION BIOPSY  01/07/2020  Procedure: BRONCHIAL NEEDLE ASPIRATION BIOPSIES;  Surgeon: Richard Gobble, MD;  Location: [facility] ENDOSCOPY;  Service: Pulmonary;; . BRONCHIAL WASHINGS  01/07/2020  Procedure: BRONCHIAL WASHINGS;  Surgeon: Richard Gobble, MD;  Location: [facility] ENDOSCOPY;  Service: Pulmonary;; . COLONOSCOPY  15 years ago . HEMOSTASIS CONTROL  01/07/2020  Procedure: HEMOSTASIS CONTROL;  Surgeon: Richard Gobble, MD;  Location: [facility] ENDOSCOPY;  Service: Pulmonary;;  cold saline . NASAL TURBINATE REDUCTION  2002  Dr.Crossley . VASECTOMY   . VIDEO BRONCHOSCOPY WITH ENDOBRONCHIAL NAVIGATION N/A 01/07/2020  Procedure: VIDEO BRONCHOSCOPY WITH ENDOBRONCHIAL NAVIGATION;  Surgeon: Richard Gobble, MD;  Location: [place] ENDOSCOPY;  Service: Pulmonary;  Laterality: N/A; . VIDEO BRONCHOSCOPY WITH ENDOBRONCHIAL ULTRASOUND N/A 01/07/2020  Procedure: VIDEO BRONCHOSCOPY WITH ENDOBRONCHIAL ULTRASOUND;  Surgeon: Richard Gobble, MD;  Location: [place] [place] ENDOSCOPY;  Service: Pulmonary;  Laterality: N/A; HPI: This is a 59 year old male with past medical history of NSCLC, asthma, hypertension, recent admission for septic shock secondary to multifocal pneumonia from 11/19-11/23 who presented to the ED with persistent shortness of breath, cough and generalized weakness. CXR 03/20/20: Extensive airspace disease throughout the right lung concerning for pneumonia versus changes from radiation treatment. Small right pleural effusion. Findings similar to prior study.  Subjective: pleasant, alert, just had radiation treatment at 11:45am Assessment / Plan / Recommendation CHL IP CLINICAL IMPRESSIONS 03/23/2020 Clinical Impression Patient presents with a mild oral and a mild-moderate pharyngeal dysphagia which appears to be sensorimotor based.  Patient has been with  dry mouth and sore throat from coughing which he stated has resulted in him not eating that much lately. During oral phase, patient exhbiited decreased anterior to posterior transit of puree and regular texture boluses as well as 1/2 barium tablet (patient requested to have it split in half). During pharyngeal phase, patient had decreased hyolaryngeal elevation with decreased pharyngeal contraction leading to moderate vallecular sinus residuals with puree solids and regular solids, mild pyriform sinus residuals with puree solids, regular solids and thin liquds. Majority of vallecular residuals cleared with sips of thin liquids and extra/dry swallows. Patient did exhibit one instance of flash penetration with thin liquids of trace amount, as well as silent penetration of trace amount of thin liquids that led to deep penetration that was also not sensed. No aspiration was observed. Pyriform sinus residuals were also impacted by patient's decreased UES opening which was observed with all consistencies. SLP Visit Diagnosis Dysphagia, oropharyngeal phase (R13.12) Attention and concentration deficit following -- Frontal lobe and executive function deficit following -- Impact on safety and function Mild aspiration risk;Risk for inadequate nutrition/hydration   CHL IP TREATMENT RECOMMENDATION 03/23/2020 Treatment Recommendations Therapy as outlined in treatment plan below   Prognosis 03/23/2020 Prognosis for Safe Diet Advancement Good Barriers to Reach Goals -- Barriers/Prognosis Comment -- CHL IP DIET RECOMMENDATION 03/23/2020 SLP Diet Recommendations Regular solids;Thin liquid Liquid Administration via Cup;Straw Medication Administration Whole meds with liquid Compensations Small sips/bites;Slow rate;Follow solids with liquid Postural Changes Seated upright at 90 degrees   CHL IP OTHER RECOMMENDATIONS 03/23/2020 Recommended Consults -- Oral Care Recommendations Oral care BID;Patient independent with oral care Other  Recommendations --   CHL IP FOLLOW UP RECOMMENDATIONS 03/23/2020 Follow up Recommendations Other (comment)   CHL IP FREQUENCY AND DURATION 03/23/2020 Speech Therapy Frequency (ACUTE ONLY) min 2x/week Treatment Duration 1 week      CHL IP ORAL PHASE 03/23/2020 Oral Phase Impaired Oral - Pudding Teaspoon -- Oral - Pudding Cup -- Oral - Honey Teaspoon -- Oral - Honey Cup -- Oral - Nectar Teaspoon -- Oral - Nectar Cup -- Oral - Nectar Straw -- Oral - Thin Teaspoon -- Oral - Thin Cup -- Oral - Thin Straw -- Oral - Puree Reduced posterior propulsion;Delayed oral transit;Weak lingual manipulation Oral - Mech Soft -- Oral - Regular Weak lingual manipulation;Delayed oral transit;Reduced posterior propulsion Oral - Multi-Consistency -- Oral - Pill Reduced posterior propulsion;Delayed oral transit;Weak lingual manipulation;Decreased bolus cohesion Oral Phase - Comment --  CHL IP PHARYNGEAL PHASE 03/23/2020 Pharyngeal Phase Impaired Pharyngeal- Pudding Teaspoon -- Pharyngeal -- Pharyngeal- Pudding Cup -- Pharyngeal -- Pharyngeal- Honey Teaspoon -- Pharyngeal -- Pharyngeal- Honey Cup -- Pharyngeal -- Pharyngeal- Nectar Teaspoon -- Pharyngeal -- Pharyngeal- Nectar Cup -- Pharyngeal -- Pharyngeal- Nectar Straw -- Pharyngeal -- Pharyngeal- Thin Teaspoon -- Pharyngeal -- Pharyngeal- Thin Cup Penetration/Aspiration during swallow;Penetration/Apiration after swallow;Pharyngeal residue - pyriform;Compensatory strategies attempted (with notebox) Pharyngeal Material enters airway, remains ABOVE vocal cords then ejected out;Material enters airway, remains ABOVE vocal cords and not ejected out Pharyngeal- Thin Straw Penetration/Aspiration during swallow;Penetration/Apiration after swallow;Pharyngeal residue - pyriform;Reduced anterior laryngeal mobility Pharyngeal Material enters airway, remains ABOVE vocal cords then ejected out;Material enters airway, remains ABOVE vocal cords and not ejected out Pharyngeal- Puree Reduced pharyngeal  peristalsis;Reduced tongue base retraction;Pharyngeal residue - valleculae;Pharyngeal residue - pyriform;Reduced anterior laryngeal mobility Pharyngeal -- Pharyngeal- Mechanical Soft -- Pharyngeal -- Pharyngeal- Regular Reduced tongue base retraction;Pharyngeal residue - valleculae;Pharyngeal residue - pyriform;Reduced pharyngeal peristalsis;Reduced anterior laryngeal mobility Pharyngeal -- Pharyngeal- Multi-consistency --  Pharyngeal -- Pharyngeal- Pill Delayed swallow initiation-vallecula;Reduced anterior laryngeal mobility Pharyngeal -- Pharyngeal Comment --  CHL IP CERVICAL ESOPHAGEAL PHASE 03/23/2020 Cervical Esophageal Phase Impaired Pudding Teaspoon -- Pudding Cup -- Honey Teaspoon -- Honey Cup -- Nectar Teaspoon -- Nectar Cup -- Nectar Straw -- Thin Teaspoon -- Thin Cup Reduced cricopharyngeal relaxation Thin Straw Reduced cricopharyngeal relaxation Puree Reduced cricopharyngeal relaxation Mechanical Soft -- Regular Reduced cricopharyngeal relaxation Multi-consistency -- Pill Reduced cricopharyngeal relaxation Cervical Esophageal Comment -- Sonia Baller, MA, CCC-SLP Speech Therapy             ECHOCARDIOGRAM COMPLETE  Result Date: 03/21/2020    ECHOCARDIOGRAM REPORT   Patient Name:   DONZELL COLLER Date of Exam: 03/21/2020 Medical Rec #:  [id number]       Height:       70.5 in Accession #:    [id number]      Weight:       190.9 lb Date of Birth:  1961/02/13       BSA:          2.057 m Patient Age:    23 years        BP:           113/72 mmHg Patient Gender: M               HR:           89 bpm. Exam Location:  Inpatient Procedure: 2D Echo, Cardiac Doppler, Color Doppler and Intracardiac            Opacification Agent Indications:    Dyspnea 786.09 / R06.00  History:        Patient has no prior history of Echocardiogram examinations.                 Risk Factors:Hypertension, Dyslipidemia and Former Smoker.  Sonographer:    Vickie Epley RDCS Referring Phys: [id number] Harold Hedge  Sonographer Comments:  Suboptimal parasternal window. IMPRESSIONS  1. Left ventricular ejection fraction, by estimation, is 60 to 65%. The left ventricle has normal function. The left ventricle has no regional wall motion abnormalities. Left ventricular diastolic parameters are consistent with Grade I diastolic dysfunction (impaired relaxation). Elevated left ventricular end-diastolic pressure.  2. Right ventricular systolic function is normal. The right ventricular size is normal.  3. A small pericardial effusion is present. The pericardial effusion is posterior to the left ventricle. There is no evidence of cardiac tamponade.  4. The mitral valve is grossly normal. Trivial mitral valve regurgitation.  5. The aortic valve was not well visualized. Aortic valve regurgitation is not visualized.  6. Aortic dilatation noted. There is borderline dilatation of the ascending aorta, measuring 38 mm. There is mild dilatation of the aortic root, measuring 41 mm.  7. The inferior vena cava is normal in size with <50% respiratory variability, suggesting right atrial pressure of 8 mmHg. FINDINGS  Left Ventricle: Left ventricular ejection fraction, by estimation, is 60 to 65%. The left ventricle has normal function. The left ventricle has no regional wall motion abnormalities. Definity contrast agent was given IV to delineate the left ventricular  endocardial borders. The left ventricular internal cavity size was normal in size. There is no left ventricular hypertrophy. Left ventricular diastolic parameters are consistent with Grade I diastolic dysfunction (impaired relaxation). Elevated left ventricular end-diastolic pressure. Right Ventricle: The right ventricular size is normal. No increase in right ventricular wall thickness. Right ventricular systolic function is normal. Left Atrium: Left atrial size  was normal in size. Right Atrium: Right atrial size was normal in size. Pericardium: A small pericardial effusion is present. The pericardial  effusion is posterior to the left ventricle. There is no evidence of cardiac tamponade. Mitral Valve: The mitral valve is grossly normal. Trivial mitral valve regurgitation. Tricuspid Valve: The tricuspid valve is grossly normal. Tricuspid valve regurgitation is trivial. Aortic Valve: The aortic valve was not well visualized. Aortic valve regurgitation is not visualized. Pulmonic Valve: The pulmonic valve was grossly normal. Pulmonic valve regurgitation is not visualized. Aorta: Aortic dilatation noted. There is borderline dilatation of the ascending aorta, measuring 38 mm. There is mild dilatation of the aortic root, measuring 41 mm. Venous: The inferior vena cava is normal in size with less than 50% respiratory variability, suggesting right atrial pressure of 8 mmHg. IAS/Shunts: No atrial level shunt detected by color flow Doppler.  LEFT VENTRICLE PLAX 2D LVIDd:         5.10 cm      Diastology LVIDs:         3.90 cm      LV e' medial:    7.38 cm/s LV PW:         0.70 cm      LV E/e' medial:  18.6 LV IVS:        0.70 cm      LV e' lateral:   8.44 cm/s LVOT diam:     2.50 cm      LV E/e' lateral: 16.2 LV SV:         129 LV SV Index:   63 LVOT Area:     4.91 cm  LV Volumes (MOD) LV vol d, MOD A2C: 190.0 ml LV vol d, MOD A4C: 180.0 ml LV vol s, MOD A2C: 66.1 ml LV vol s, MOD A4C: 62.7 ml LV SV MOD A2C:     123.9 ml LV SV MOD A4C:     180.0 ml LV SV MOD BP:      121.8 ml RIGHT VENTRICLE RV S prime:     10.20 cm/s TAPSE (M-mode): 2.1 cm LEFT ATRIUM             Index       RIGHT ATRIUM           Index LA diam:        4.50 cm 2.19 cm/m  RA Area:     12.50 cm LA Vol (A2C):   52.2 ml 25.37 ml/m RA Volume:   25.70 ml  12.49 ml/m LA Vol (A4C):   48.1 ml 23.38 ml/m LA Biplane Vol: 51.3 ml 24.94 ml/m  AORTIC VALVE LVOT Vmax:   113.00 cm/s LVOT Vmean:  77.300 cm/s LVOT VTI:    0.263 m  AORTA Ao Root diam: 3.90 cm Ao Asc diam:  3.80 cm MITRAL VALVE MV Area (PHT): 5.13 cm     SHUNTS MV Decel Time: 148 msec     Systemic  VTI:  0.26 m MV E velocity: 137.00 cm/s  Systemic Diam: 2.50 cm MV A velocity: 95.50 cm/s MV E/A ratio:  1.43 Lyman Bishop MD Electronically signed by Lyman Bishop MD Signature Date/Time: 03/21/2020/12:33:26 PM    Final    DG C-ARM BRONCHOSCOPY  Result Date: 04/05/2020 C-ARM BRONCHOSCOPY: Fluoroscopy was utilized by the requesting physician.  No radiographic interpretation.    Lab Results:  CBC    Component Value Date/Time   WBC 10.3 04/07/2020 0134   RBC 2.78 (L) 04/07/2020 0134   HGB  8.2 (L) 04/07/2020 0134   HGB 9.2 (L) 03/09/2020 0841   HCT 24.1 (L) 04/07/2020 0134   PLT 463 (H) 04/07/2020 0134   PLT 128 (L) 03/09/2020 0841   MCV 86.7 04/07/2020 0134   MCH 29.5 04/07/2020 0134   MCHC 34.0 04/07/2020 0134   RDW 13.8 04/07/2020 0134   LYMPHSABS 0.5 (L) 04/07/2020 0134   MONOABS 0.7 04/07/2020 0134   EOSABS 0.0 04/07/2020 0134   BASOSABS 0.0 04/07/2020 0134    BMET    Component Value Date/Time   NA 136 04/07/2020 0134   K 3.7 04/07/2020 0134   CL 101 04/07/2020 0134   CO2 26 04/07/2020 0134   GLUCOSE 147 (H) 04/07/2020 0134   BUN 11 04/07/2020 0134   CREATININE 0.64 04/07/2020 0134   CREATININE 0.69 03/09/2020 0841   CREATININE 1.26 11/06/2019 1132   CALCIUM 8.7 (L) 04/07/2020 0134   GFRNONAA >60 04/07/2020 0134   GFRNONAA >60 03/09/2020 0841   GFRNONAA 62 11/06/2019 1132   GFRAA >60 01/17/2020 0837   GFRAA 72 11/06/2019 1132    BNP No results found for: BNP  ProBNP No results found for: PROBNP  Specialty Problems      Pulmonary Problems   Allergic rhinitis    Qualifier: Diagnosis of  By: Julien Girt CMA, Leigh        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HCC)    Qualifier: Diagnosis of  By: Julien Girt CMA, Leigh        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   Pulmonary nodules/lesions, multiple   Adenocarcinoma of right lung, stage 2 (HCC)   Sinus congestion   Multifocal pneumonia   COPD with acute exacerbation  (HCC)   SOB (shortness of breath)   Sepsis due to pneumonia ([place])      No Known Allergies  Immunization History  Administered Date(s) Administered  . Influenza Split 01/25/2012  . Influenza Whole 02/13/2009, 01/25/2010  . Influenza,inj,Quad PF,6+ Mos 03/30/2015, 02/16/2016, 02/27/2017, 01/24/2018, 03/01/2019, 02/10/2020  . Influenza,inj,quad, With Preservative 01/23/2017  . Tdap 12/12/2011    Past Medical History:  Diagnosis Date  . Allergic rhinitis, cause unspecified   . Anxiety state, unspecified   . Asthma    as a child  . Chronic airway obstruction, not elsewhere classified   . Esophageal reflux   . History of kidney stones   . Hypertension   . Lumbago   . Other and unspecified hyperlipidemia   . Other chest pain     Tobacco History: Social History   Tobacco Use  Smoking Status Former Smoker  . Packs/day: 2.00  . Years: 28.00  . Pack years: 56.00  . Types: Cigarettes  . Quit date: 04/25/2005  . Years since quitting: 14.9  Smokeless Tobacco Never Used   Counseling given: Not Answered   Continue to not smoke  Outpatient Encounter Medications as of 04/13/2020  Medication Sig  . acetaminophen (TYLENOL) 500 MG tablet Take 1,000 mg by mouth every 6 (six) hours as needed for mild pain.  [name] amoxicillin-clavulanate (AUGMENTIN) 875-125 MG tablet Take 1 tablet by mouth every 12 (twelve) hours for 7 days.  [name] dextromethorphan (DELSYM) 30 MG/5ML liquid Take 30 mg by mouth at bedtime as needed for cough.  . dextromethorphan-guaiFENesin (MUCINEX DM) 30-600 MG 12hr tablet Take 1 tablet by mouth 2 (two) times daily.  . fenofibrate 160 MG tablet Take 1 tablet (160 mg total) by mouth daily.  . folic acid (FOLVITE) 1 MG tablet Take 1  tablet (1 mg total) by mouth daily.  . Ipratropium-Albuterol (COMBIVENT RESPIMAT) 20-100 MCG/ACT AERS respimat Inhale 1-2 puffs into the lungs every 6 (six) hours as needed for wheezing.  [name] LORazepam (ATIVAN) 1 MG tablet TAKE 1 TABLET BY MOUTH  THREE TIMES DAILY AS NEEDED FOR ANXIETY (Patient taking differently: Take 1 mg by mouth See admin instructions. Take 1 mg by mouth at bedtime and an additional 1 mg up to two times a day as needed for anxiety)  . losartan (COZAAR) 100 MG tablet Take 1 tablet (100 mg total) by mouth daily.  . Multiple Vitamin (MULTIVITAMIN WITH MINERALS) TABS tablet Take 1 tablet by mouth daily.  . predniSONE (DELTASONE) 20 MG tablet Take 2 tablets (40 mg total) by mouth daily with breakfast for 7 days, THEN 1 tablet (20 mg total) daily with breakfast for 7 days.  . prochlorperazine (COMPAZINE) 10 MG tablet TAKE 1 TABLET BY MOUTH EVERY 6 HOURS AS NEEDED FOR NAUSEA FOR VOMITING (Patient taking differently: Take 10 mg by mouth every 6 (six) hours as needed for nausea or vomiting.)  . traZODone (DESYREL) 50 MG tablet TAKE 1 TO 2 TABLETS BY MOUTH AT BEDTIME AS NEEDED FOR SLEEP (Patient taking differently: Take 50 mg by mouth at bedtime.)  . vitamin B-12 1000 MCG tablet Take 1 tablet (1,000 mcg total) by mouth daily.  [name] amoxicillin-clavulanate (AUGMENTIN) 875-125 MG tablet Take 1 tablet by mouth 2 (two) times daily for 14 days.   Facility-Administered Encounter Medications as of 04/13/2020  Medication  . 0.9 %  sodium chloride infusion     Review of Systems n/a  Physical Exam  BP 118/78   Pulse (!) 120   Temp 97.8 F (36.6 C) (Temporal)   SpO2 97% Comment: on RA  Wt Readings from Last 5 Encounters:  04/04/20 168 lb 11.2 oz (76.5 kg)  03/23/20 198 lb 3.2 oz (89.9 kg)  03/13/20 207 lb 8 oz (94.1 kg)  03/09/20 207 lb 8 oz (94.1 kg)  03/08/20 195 lb 8.8 oz (88.7 kg)    BMI Readings from Last 5 Encounters:  04/04/20 24.21 kg/m  03/23/20 28.04 kg/m  03/13/20 29.35 kg/m  03/09/20 29.35 kg/m  03/08/20 27.66 kg/m     Physical Exam General: Ill appearing, in NAD Eyes: EOMI, no icterus Respiratory: Coarse bronchial breath sounds on inspiration throughout the right, left more clear Cardiovascular:  Borderline tachycardic, no murmurs Extremities: No edema, warm   Assessment & Plan:   Productive cough, CXR infiltrate: 4 hospitalizations for the same. BAL with OP flora. Unfortunately no cell counts sent. Given location of tumor and images from bronch, fear obstruction of portion of upper lobe leading to post-obstructive pneumonia and lack of resolution. Foul smelling green purulent sputum. Favor RML and RLL infiltrates to represent aspiration of mucous down bronchial tree. Timing of symptoms during radiation does not fit with radiation pneumonitis. Productive cough also does not fit. Stop prednisone. Add 2 weeks of Augmentin to current course. Repeat CT scan ordered. Possible this represents explosive tumor growth via lymphangitic spread but degree and type of mucous seems to argue for more infection. Taxane toxicity considered but felt less likely.    Return in about 4 weeks (around 05/11/2020).   Lanier Clam, MD 04/14/2020   This appointment required 65 minutes of patient care (this includes precharting, chart review, review of results, face-to-face care, etc.).

## 2020-04-17 ENCOUNTER — Encounter (HOSPITAL_COMMUNITY): Payer: Self-pay

## 2020-04-17 ENCOUNTER — Ambulatory Visit (HOSPITAL_COMMUNITY)
Admission: RE | Admit: 2020-04-17 | Discharge: 2020-04-17 | Disposition: A | Discharge status: Home / Self Care | Payer: BC Managed Care – PPO | Source: Ambulatory Visit | Attending: Pulmonary Disease | Admitting: Pulmonary Disease

## 2020-04-17 ENCOUNTER — Other Ambulatory Visit: Payer: Self-pay

## 2020-04-17 DIAGNOSIS — J189 Pneumonia, unspecified organism: Secondary | ICD-10-CM

## 2020-04-17 HISTORY — DX: Malignant (primary) neoplasm, unspecified: C80.1

## 2020-04-17 IMAGING — CT CT CHEST W/O CM
2 of 4 series · 15 of 36 positions shown, 18 images · non-contrast
Comparison: [DATE]

CLINICAL DATA: Unresolved pneumonia. History of lung cancer. Cough
3 months and worsening shortness of breath 1 week.

EXAM:
CT CHEST WITHOUT CONTRAST
TECHNIQUE: Multidetector CT imaging of the chest was performed following the
standard protocol without IV contrast.

[Series 2: thorax · axial · 0.83mm/px · z∈[+733,+1031]mm · 12 of 177 slices shown, 15 images]
[im 14/177  mediastinal]
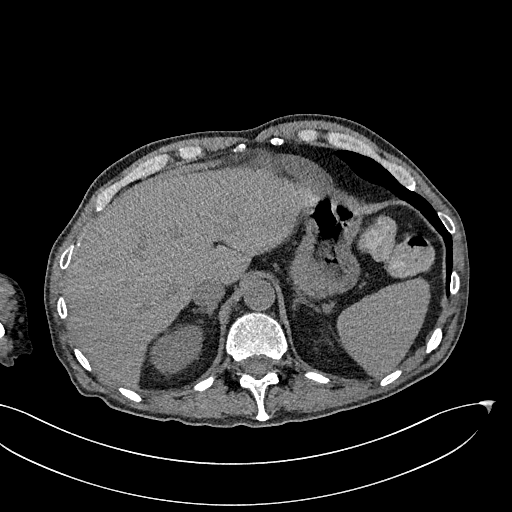
[im 14/177  lung]
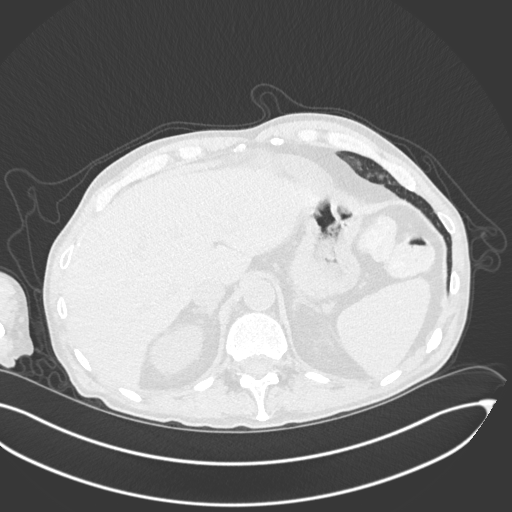
[im 28/177  lung]
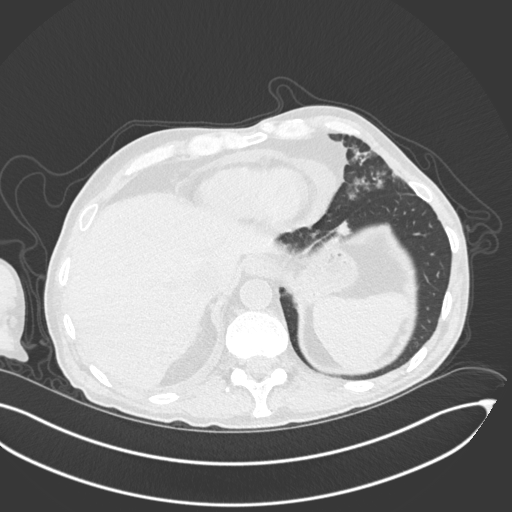
[im 41/177  lung]
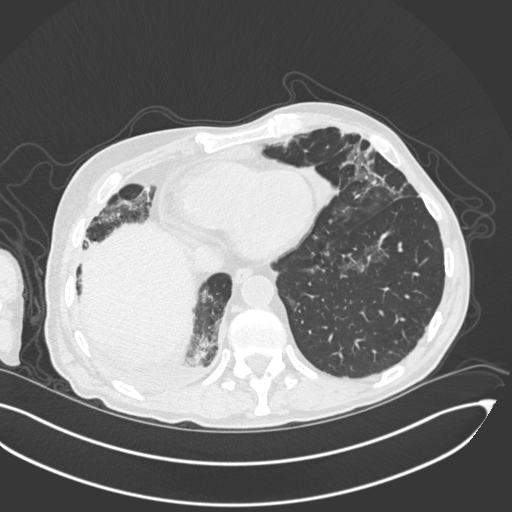
[im 55/177  lung]
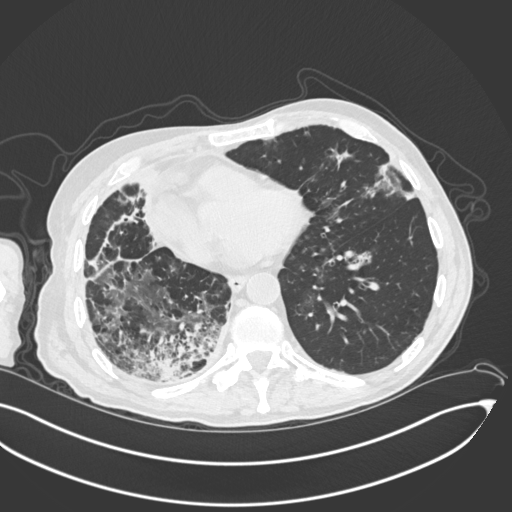
[im 68/177  mediastinal]
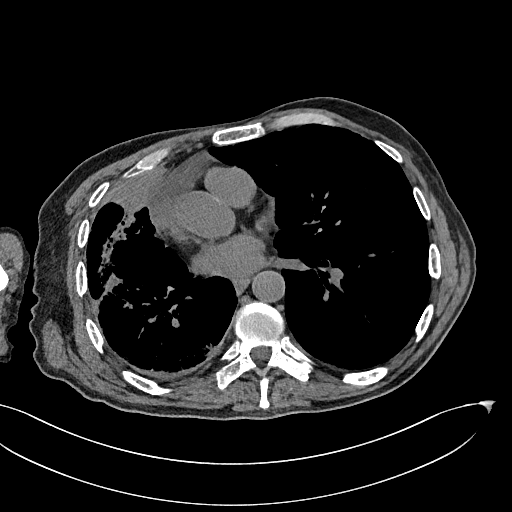
[im 68/177  lung]
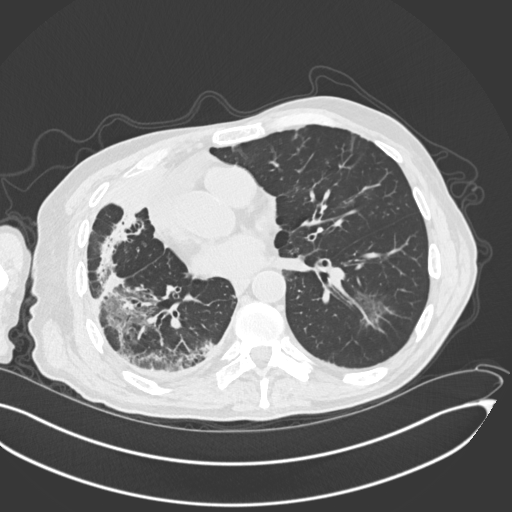
[im 82/177  lung]
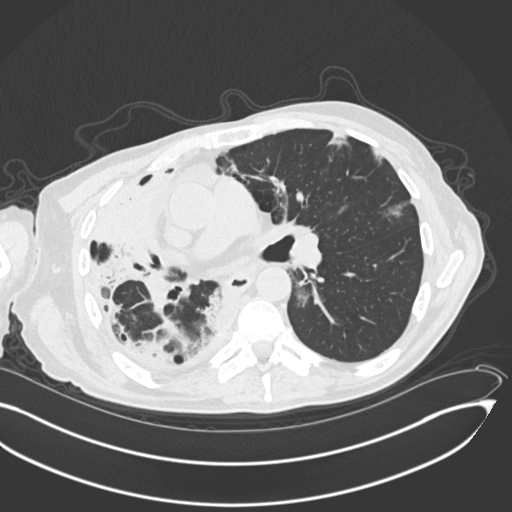
[im 95/177  lung]
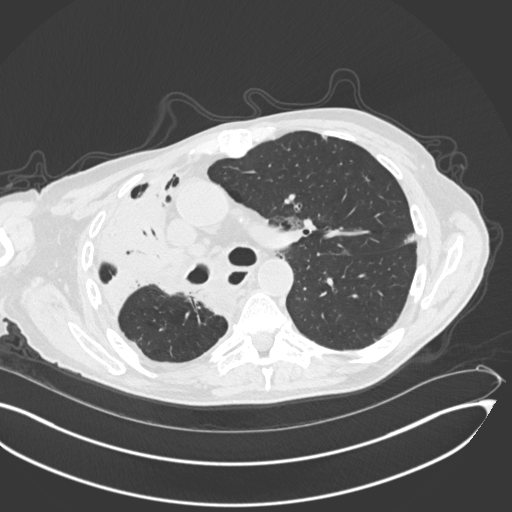
[im 109/177  lung]
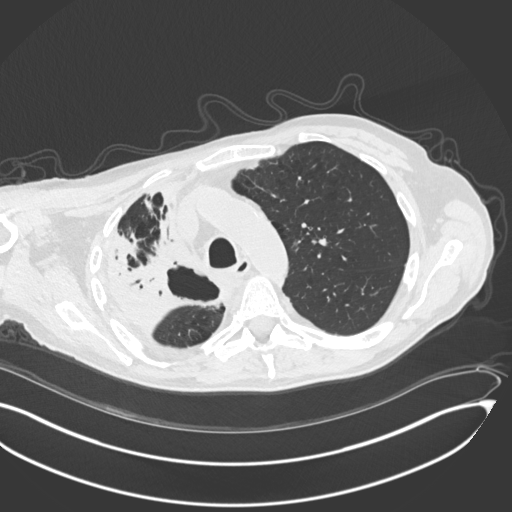
[im 122/177  mediastinal]
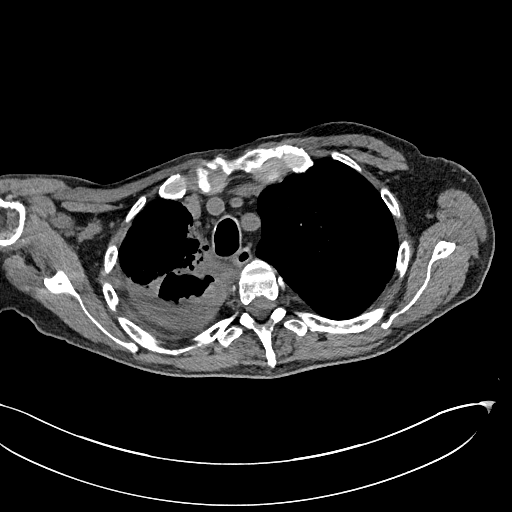
[im 122/177  lung]
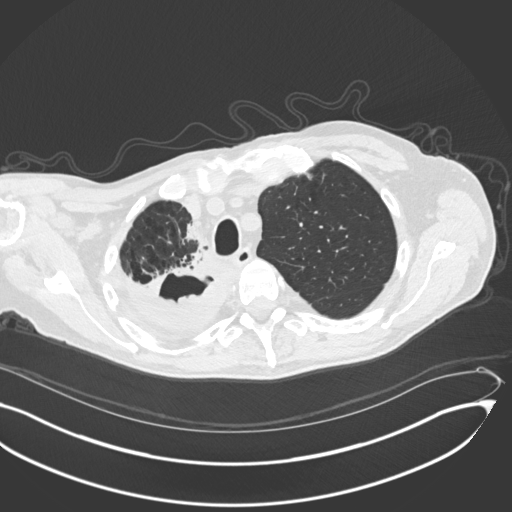
[im 136/177  lung]
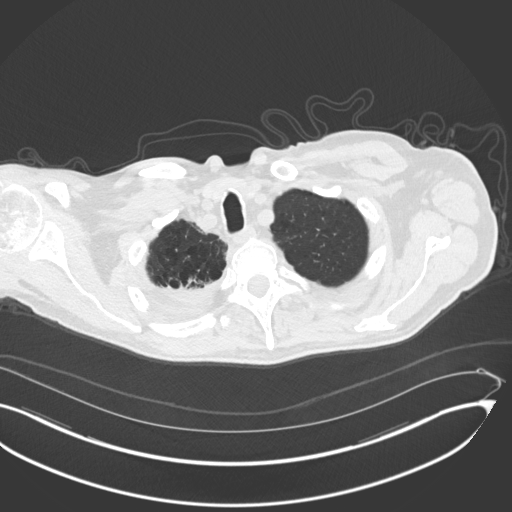
[im 149/177  lung]
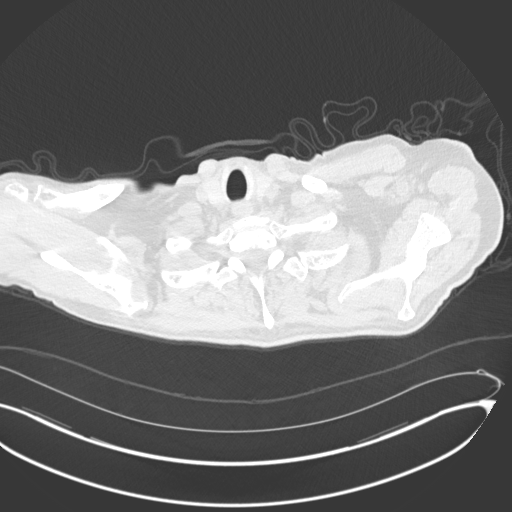
[im 163/177  lung]
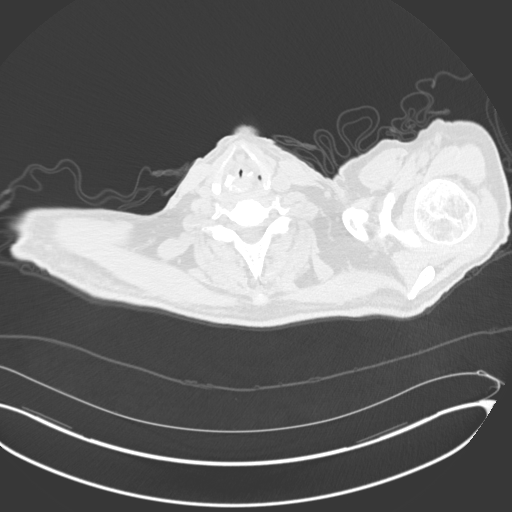

[Series 5: coronal · coronal · 0.69mm/px · 3 of 139 slices shown]
[im 28/139  lung]
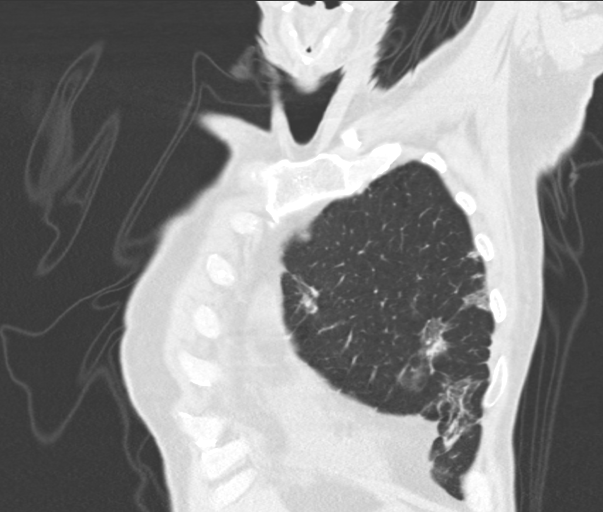
[im 56/139  lung]
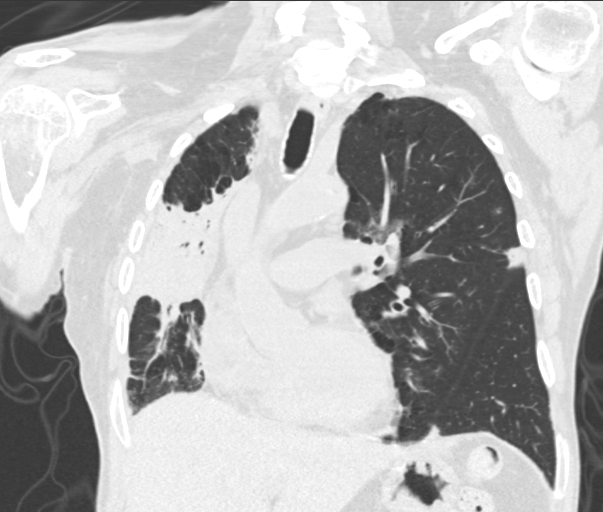
[im 83/139  lung]
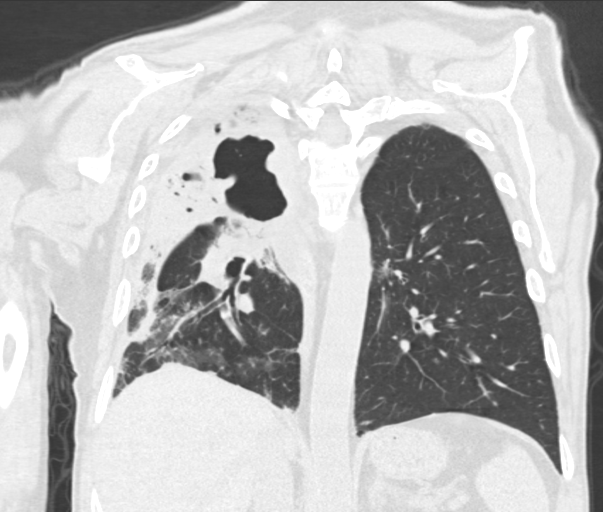

[15 of 36 positions shown; findings below may reference images not displayed]

FINDINGS: Cardiovascular: Heart is normal size. There is a small pericardial
effusion. Thoracic aorta is unremarkable. Remaining vascular
structures are unchanged.

Mediastinum/Nodes: There is cardiomediastinal shift to the right
which is more pronounced. 1.6 cm subcarinal lymph node without
significant change. Few other shotty mediastinal lymph nodes. No
definite hilar adenopathy on this noncontrast exam. Remaining
mediastinal structures are unremarkable.

Lungs/Pleura: Evidence patient's known cavitary lung cancer over the
central right upper lobe as the cavitary component is larger
measuring 3.7 x 5.1 x 6 cm (previously 3 x 4.1 x 2.7 cm). There is
associated adjacent consolidation with worsening consolidation over
the anteromedial right upper lobe/middle lobe. Mild improved patchy
airspace opacification with better aeration over the right lower
lobe and right middle lobe. Tiny right pleural fluid collection
improved.

Persistent patchy airspace process throughout the left lung slightly
worse overall. Stable discrete 5-6 mm solid nodule over the left
lower lobe. No left effusion. Airways unremarkable.

Upper Abdomen: No acute findings.

Musculoskeletal: Unchanged.
IMPRESSION: 1. Evidence patient's known cavitary lung cancer over the central
right upper lobe larger with cavitary component measuring 3.7 x
x 6 cm (previously 3 x 4.1 x 2.7 cm). Stable adjacent consolidation
with worsening consolidation over the anteromedial right upper
lobe/middle lobe. Improved patchy airspace opacification with better
aeration over the right lower lobe and right middle lobe. Tiny right
pleural fluid collection improved.
2. Persistent patchy airspace process throughout the left lung
slightly worse overall which may be due to ongoing infection. Stable
5-6 mm solid nodule over the left lower lobe as recommend attention
on follow-up. Stable 1.6 cm subcarinal lymph node.
3. Small pericardial effusion.

## 2020-04-22 ENCOUNTER — Inpatient Hospital Stay: Payer: BC Managed Care – PPO

## 2020-04-22 ENCOUNTER — Other Ambulatory Visit: Payer: Self-pay

## 2020-04-22 ENCOUNTER — Inpatient Hospital Stay: Payer: BC Managed Care – PPO | Attending: Internal Medicine | Admitting: Internal Medicine

## 2020-04-22 ENCOUNTER — Telehealth: Payer: Self-pay | Admitting: *Deleted

## 2020-04-22 VITALS — BP 112/75 | HR 128 | Temp 97.7°F | Resp 18 | Ht 70.0 in | Wt 165.2 lb

## 2020-04-22 DIAGNOSIS — K219 Gastro-esophageal reflux disease without esophagitis: Secondary | ICD-10-CM | POA: Diagnosis not present

## 2020-04-22 DIAGNOSIS — R634 Abnormal weight loss: Secondary | ICD-10-CM | POA: Insufficient documentation

## 2020-04-22 DIAGNOSIS — R5383 Other fatigue: Secondary | ICD-10-CM | POA: Diagnosis not present

## 2020-04-22 DIAGNOSIS — I10 Essential (primary) hypertension: Secondary | ICD-10-CM | POA: Diagnosis not present

## 2020-04-22 DIAGNOSIS — A419 Sepsis, unspecified organism: Secondary | ICD-10-CM | POA: Insufficient documentation

## 2020-04-22 DIAGNOSIS — E43 Unspecified severe protein-calorie malnutrition: Secondary | ICD-10-CM

## 2020-04-22 DIAGNOSIS — I313 Pericardial effusion (noninflammatory): Secondary | ICD-10-CM | POA: Insufficient documentation

## 2020-04-22 DIAGNOSIS — Z923 Personal history of irradiation: Secondary | ICD-10-CM | POA: Insufficient documentation

## 2020-04-22 DIAGNOSIS — E876 Hypokalemia: Secondary | ICD-10-CM | POA: Diagnosis not present

## 2020-04-22 DIAGNOSIS — M6281 Muscle weakness (generalized): Secondary | ICD-10-CM | POA: Diagnosis not present

## 2020-04-22 DIAGNOSIS — C3491 Malignant neoplasm of unspecified part of right bronchus or lung: Secondary | ICD-10-CM

## 2020-04-22 DIAGNOSIS — C3411 Malignant neoplasm of upper lobe, right bronchus or lung: Secondary | ICD-10-CM | POA: Insufficient documentation

## 2020-04-22 DIAGNOSIS — C3490 Malignant neoplasm of unspecified part of unspecified bronchus or lung: Secondary | ICD-10-CM

## 2020-04-22 DIAGNOSIS — J440 Chronic obstructive pulmonary disease with acute lower respiratory infection: Secondary | ICD-10-CM | POA: Insufficient documentation

## 2020-04-22 DIAGNOSIS — E785 Hyperlipidemia, unspecified: Secondary | ICD-10-CM | POA: Diagnosis not present

## 2020-04-22 DIAGNOSIS — J189 Pneumonia, unspecified organism: Secondary | ICD-10-CM | POA: Insufficient documentation

## 2020-04-22 DIAGNOSIS — Z79899 Other long term (current) drug therapy: Secondary | ICD-10-CM | POA: Insufficient documentation

## 2020-04-22 DIAGNOSIS — Z9221 Personal history of antineoplastic chemotherapy: Secondary | ICD-10-CM | POA: Insufficient documentation

## 2020-04-22 LAB — CBC WITH DIFFERENTIAL (CANCER CENTER ONLY)
Abs Immature Granulocytes: 0.05 10*3/uL (ref 0.00–0.07)
Basophils Absolute: 0 10*3/uL (ref 0.0–0.1)
Basophils Relative: 0 %
Eosinophils Absolute: 0.2 10*3/uL (ref 0.0–0.5)
Eosinophils Relative: 2 %
HCT: 28.1 % — ABNORMAL LOW (ref 39.0–52.0)
Hemoglobin: 9 g/dL — ABNORMAL LOW (ref 13.0–17.0)
Immature Granulocytes: 1 %
Lymphocytes Relative: 10 %
Lymphs Abs: 1.1 10*3/uL (ref 0.7–4.0)
MCH: 28.2 pg (ref 26.0–34.0)
MCHC: 32 g/dL (ref 30.0–36.0)
MCV: 88.1 fL (ref 80.0–100.0)
Monocytes Absolute: 0.9 10*3/uL (ref 0.1–1.0)
Monocytes Relative: 8 %
Neutro Abs: 8.7 10*3/uL — ABNORMAL HIGH (ref 1.7–7.7)
Neutrophils Relative %: 79 %
Platelet Count: 439 10*3/uL — ABNORMAL HIGH (ref 150–400)
RBC: 3.19 MIL/uL — ABNORMAL LOW (ref 4.22–5.81)
RDW: 15.1 % (ref 11.5–15.5)
WBC Count: 11 10*3/uL — ABNORMAL HIGH (ref 4.0–10.5)
nRBC: 0 % (ref 0.0–0.2)

## 2020-04-22 LAB — CMP (CANCER CENTER ONLY)
ALT: 15 U/L (ref 0–44)
AST: 16 U/L (ref 15–41)
Albumin: 2.4 g/dL — ABNORMAL LOW (ref 3.5–5.0)
Alkaline Phosphatase: 111 U/L (ref 38–126)
Anion gap: 10 (ref 5–15)
BUN: 11 mg/dL (ref 6–20)
CO2: 27 mmol/L (ref 22–32)
Calcium: 9.7 mg/dL (ref 8.9–10.3)
Chloride: 103 mmol/L (ref 98–111)
Creatinine: 0.68 mg/dL (ref 0.61–1.24)
GFR, Estimated: >60 mL/min (ref >60)
Glucose, Bld: 109 mg/dL — ABNORMAL HIGH (ref 70–99)
Potassium: 2.8 mmol/L — ABNORMAL LOW (ref 3.5–5.1)
Sodium: 140 mmol/L (ref 135–145)
Total Bilirubin: 0.6 mg/dL (ref 0.3–1.2)
Total Protein: 7.2 g/dL (ref 6.5–8.1)

## 2020-04-22 MED ORDER — POTASSIUM CHLORIDE CRYS ER 20 MEQ PO TBCR: 40.0000 meq | EXTENDED_RELEASE_TABLET | Freq: Every day | ORAL | 0 refills | Status: DC

## 2020-04-22 NOTE — Progress Notes (Signed)
CT shows right middle and right lower pneumonia is a bit better. Finish antibiotics and ask him to call us and let me know how the cough and sputum is doing next week. Thanks!

## 2020-04-22 NOTE — Progress Notes (Signed)
[place] [phone]   [phone]  OFFICE PROGRESS NOTE  Richard Nutting, DO [place] [address]  DIAGNOSIS: Stage IIB (T3, N0, M0) non-small cell lung cancer, adenocarcinoma presented with large central perihilar mass with suspicious groundglass opacity in the right middle lobe and left upper lobe diagnosed in September 2021.  PRIOR THERAPY: Concurrent chemoradiation with weekly carboplatin for AUC of 2 and paclitaxel 45 mg/M2.  First dose February 10, 2020.  Status post 5  cycles.    CURRENT THERAPY: Observation.  INTERVAL HISTORY: Richard Li 59 y.o. male returns to the clinic today for follow-up visit accompanied by his wife.  The patient continues to have significant fatigue and weakness as well as cough productive of greenish sputum but no hemoptysis.  He has shortness of breath with exertion.  He denied having any recent headache or visual changes.  He has no nausea, vomiting, diarrhea or constipation.  He received 5 cycles of concurrent chemoradiation which was complicated with frequent hospitalization for radiation pneumonitis as well as pneumonia.  He was admitted few times and treated with a tapered dose of prednisone as well as several courses of antibiotics.  He lost a lot of weight recently.  The patient is followed by Dr. Silas Flood from pulmonary medicine.  He had repeat CT scan of the chest performed recently and he is here for evaluation and discussion of his scan results.  MEDICAL HISTORY: Past Medical History:  Diagnosis Date  . Allergic rhinitis, cause unspecified   . Anxiety state, unspecified   . Asthma    as a child  . Chronic airway obstruction, not elsewhere classified   . Esophageal reflux   . History of kidney stones   . Hypertension   . Lumbago   . lung ca dx'd 12/2019  . Other and unspecified hyperlipidemia   . Other chest pain     ALLERGIES:  has No Known  Allergies.  MEDICATIONS:  Current Outpatient Medications  Medication Sig Dispense Refill  . acetaminophen (TYLENOL) 500 MG tablet Take 1,000 mg by mouth every 6 (six) hours as needed for mild pain.    [name] amoxicillin-clavulanate (AUGMENTIN) 875-125 MG tablet Take 1 tablet by mouth 2 (two) times daily for 14 days. 28 tablet 0  . dextromethorphan (DELSYM) 30 MG/5ML liquid Take 30 mg by mouth at bedtime as needed for cough.    . dextromethorphan-guaiFENesin (MUCINEX DM) 30-600 MG 12hr tablet Take 1 tablet by mouth 2 (two) times daily. 30 tablet 0  . fenofibrate 160 MG tablet Take 1 tablet (160 mg total) by mouth daily. 90 tablet 3  . folic acid (FOLVITE) 1 MG tablet Take 1 tablet (1 mg total) by mouth daily. 30 tablet 2  . Ipratropium-Albuterol (COMBIVENT RESPIMAT) 20-100 MCG/ACT AERS respimat Inhale 1-2 puffs into the lungs every 6 (six) hours as needed for wheezing. 4 g 3  . LORazepam (ATIVAN) 1 MG tablet TAKE 1 TABLET BY MOUTH THREE TIMES DAILY AS NEEDED FOR ANXIETY (Patient taking differently: Take 1 mg by mouth See admin instructions. Take 1 mg by mouth at bedtime and an additional 1 mg up to two times a day as needed for anxiety) 90 tablet 2  . losartan (COZAAR) 100 MG tablet Take 1 tablet (100 mg total) by mouth daily. 90 tablet 3  . Multiple Vitamin (MULTIVITAMIN WITH MINERALS) TABS tablet Take 1 tablet by mouth daily.    . prochlorperazine (COMPAZINE)  10 MG tablet TAKE 1 TABLET BY MOUTH EVERY 6 HOURS AS NEEDED FOR NAUSEA FOR VOMITING (Patient taking differently: Take 10 mg by mouth every 6 (six) hours as needed for nausea or vomiting.) 30 tablet 0  . traZODone (DESYREL) 50 MG tablet TAKE 1 TO 2 TABLETS BY MOUTH AT BEDTIME AS NEEDED FOR SLEEP (Patient taking differently: Take 50 mg by mouth at bedtime.) 45 tablet 5  . vitamin B-12 1000 MCG tablet Take 1 tablet (1,000 mcg total) by mouth daily. 30 tablet 0   No current facility-administered medications for this visit.   Facility-Administered  Medications Ordered in Other Visits  Medication Dose Route Frequency Provider Last Rate Last Admin  . 0.9 %  sodium chloride infusion   Intravenous Continuous Curt Bears, MD   Stopped at 03/05/20 1410    SURGICAL HISTORY:  Past Surgical History:  Procedure Laterality Date  . APPENDECTOMY    . BRONCHIAL BIOPSY  01/07/2020   Procedure: BRONCHIAL BIOPSIES;  Surgeon: Collene Gobble, MD;  Location: [facility] ENDOSCOPY;  Service: Pulmonary;;  . BRONCHIAL BIOPSY  04/05/2020   Procedure: BRONCHIAL BIOPSIES;  Surgeon: Rigoberto Noel, MD;  Location: [place];  Service: Cardiopulmonary;;  . BRONCHIAL BRUSHINGS  01/07/2020   Procedure: BRONCHIAL BRUSHINGS;  Surgeon: Collene Gobble, MD;  Location: [facility] ENDOSCOPY;  Service: Pulmonary;;  . BRONCHIAL NEEDLE ASPIRATION BIOPSY  01/07/2020   Procedure: BRONCHIAL NEEDLE ASPIRATION BIOPSIES;  Surgeon: Collene Gobble, MD;  Location: [facility] ENDOSCOPY;  Service: Pulmonary;;  . BRONCHIAL WASHINGS  01/07/2020   Procedure: BRONCHIAL WASHINGS;  Surgeon: Collene Gobble, MD;  Location: [facility] ENDOSCOPY;  Service: Pulmonary;;  . BRONCHIAL WASHINGS  04/05/2020   Procedure: BRONCHIAL WASHINGS;  Surgeon: Rigoberto Noel, MD;  Location: [place];  Service: Cardiopulmonary;;  . COLONOSCOPY  15 years ago  . HEMOSTASIS CONTROL  01/07/2020   Procedure: HEMOSTASIS CONTROL;  Surgeon: Collene Gobble, MD;  Location: [place] ENDOSCOPY;  Service: Pulmonary;;  cold saline  . NASAL TURBINATE REDUCTION  2002   Dr.Crossley  . VASECTOMY    . VIDEO BRONCHOSCOPY Right 04/05/2020   Procedure: VIDEO BRONCHOSCOPY WITH FLUORO;  Surgeon: Rigoberto Noel, MD;  Location: [place];  Service: Cardiopulmonary;  Laterality: Right;  [name] VIDEO BRONCHOSCOPY WITH ENDOBRONCHIAL NAVIGATION N/A 01/07/2020   Procedure: VIDEO BRONCHOSCOPY WITH ENDOBRONCHIAL NAVIGATION;  Surgeon: Collene Gobble, MD;  Location: [place] ENDOSCOPY;  Service: Pulmonary;  Laterality: N/A;  . VIDEO BRONCHOSCOPY WITH ENDOBRONCHIAL ULTRASOUND  N/A 01/07/2020   Procedure: VIDEO BRONCHOSCOPY WITH ENDOBRONCHIAL ULTRASOUND;  Surgeon: Collene Gobble, MD;  Location: [place] ENDOSCOPY;  Service: Pulmonary;  Laterality: N/A;    REVIEW OF SYSTEMS:  Constitutional: positive for anorexia, fatigue and weight loss Eyes: negative Ears, nose, mouth, throat, and face: negative Respiratory: positive for cough, dyspnea on exertion, pleurisy/chest pain and sputum Cardiovascular: negative Gastrointestinal: negative Genitourinary:negative Integument/breast: negative Hematologic/lymphatic: negative Musculoskeletal:positive for muscle weakness Neurological: negative Behavioral/Psych: negative Endocrine: negative Allergic/Immunologic: negative   PHYSICAL EXAMINATION: General appearance: alert, cooperative, fatigued and no distress Head: Normocephalic, without obvious abnormality, atraumatic Neck: no adenopathy, no JVD, supple, symmetrical, trachea midline and thyroid not enlarged, symmetric, no tenderness/mass/nodules Lymph nodes: Cervical, supraclavicular, and axillary nodes normal. Resp: rales RUL and rhonchi RUL Back: symmetric, no curvature. ROM normal. No CVA tenderness. Cardio: regular rate and rhythm, S1, S2 normal, no murmur, click, rub or gallop GI: soft, non-tender; bowel sounds normal; no masses,  no organomegaly Extremities: extremities normal, atraumatic, no cyanosis or edema Neurologic: Alert and oriented  X 3, normal strength and tone. Normal symmetric reflexes. Normal coordination and gait  ECOG PERFORMANCE STATUS: 1 - Symptomatic but completely ambulatory  Blood pressure 112/75, pulse (!) 128, temperature 97.7 F (36.5 C), temperature source Tympanic, resp. rate 18, height 5\' 10"  (1.778 m), weight 165 lb 3.2 oz (74.9 kg), SpO2 96 %.  LABORATORY DATA: Lab Results  Component Value Date   WBC 11.0 (H) 04/22/2020   HGB 9.0 (L) 04/22/2020   HCT 28.1 (L) 04/22/2020   MCV 88.1 04/22/2020   PLT 439 (H) 04/22/2020      Chemistry       Component Value Date/Time   NA 140 04/22/2020 1004   K 2.8 (L) 04/22/2020 1004   CL 103 04/22/2020 1004   CO2 27 04/22/2020 1004   BUN 11 04/22/2020 1004   CREATININE 0.68 04/22/2020 1004   CREATININE 1.26 11/06/2019 1132      Component Value Date/Time   CALCIUM 9.7 04/22/2020 1004   ALKPHOS 111 04/22/2020 1004   AST 16 04/22/2020 1004   ALT 15 04/22/2020 1004   BILITOT 0.6 04/22/2020 1004       RADIOGRAPHIC STUDIES: CT Chest Wo Contrast  Result Date: 04/17/2020 CLINICAL DATA:  Unresolved pneumonia. History of lung cancer. Cough 3 months and worsening shortness of breath 1 week. EXAM: CT CHEST WITHOUT CONTRAST TECHNIQUE: Multidetector CT imaging of the chest was performed following the standard protocol without IV contrast. COMPARISON:  03/13/2020 FINDINGS: Cardiovascular: Heart is normal size. There is a small pericardial effusion. Thoracic aorta is unremarkable. Remaining vascular structures are unchanged. Mediastinum/Nodes: There is cardiomediastinal shift to the right which is more pronounced. 1.6 cm subcarinal lymph node without significant change. Few other shotty mediastinal lymph nodes. No definite hilar adenopathy on this noncontrast exam. Remaining mediastinal structures are unremarkable. 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 There is associated adjacent consolidation with worsening consolidation over the anteromedial right upper lobe/middle lobe. Mild improved patchy airspace opacification with better aeration over the right lower lobe and right middle lobe. Tiny right pleural fluid collection improved. Persistent patchy airspace process throughout the left lung slightly worse overall. Stable discrete 5-6 mm solid nodule over the left lower lobe. No left effusion. Airways unremarkable. Upper Abdomen: No acute findings.  Musculoskeletal: Unchanged. IMPRESSION: 1. 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 Stable adjacent consolidation with worsening consolidation over the anteromedial right upper lobe/middle lobe. Improved patchy airspace opacification with better aeration over the right lower lobe and right middle lobe. Tiny right pleural fluid collection improved. 2. Persistent patchy airspace process throughout the left lung slightly worse overall which may be due to ongoing infection. Stable 5-6 mm solid nodule over the left lower lobe as recommend attention on follow-up. Stable 1.6 cm subcarinal lymph node. 3. Small pericardial effusion. Electronically Signed   By: Marin Olp M.D.   On: 04/17/2020 14:02   DG CHEST PORT 1 VIEW  Result Date: 04/05/2020 CLINICAL DATA:  59 year old male with history of bronchoscopy and biopsy. EXAM: PORTABLE CHEST 1 VIEW COMPARISON:  Chest x-ray 04/04/2020. FINDINGS: Irregular opacities are again noted throughout the right mid to lower lung, with multiple nodular densities again noted in the left lung, similar to prior examinations. No  definite pleural effusions. No evidence of pulmonary edema. Heart size is mildly enlarged. Mediastinal contours are distorted. IMPRESSION: 1. The overall appearance the chest is very similar to prior examinations, with presumed atelectasis and consolidation throughout the right mid to lower lung, and multiple metastatic nodules in the left lung. No new acute findings. Electronically Signed   By: Vinnie Langton M.D.   On: 04/05/2020 12:37   DG Chest Port 1 View  Result Date: 04/04/2020 CLINICAL DATA:  59 year old male with history of increasing shortness of breath. EXAM: PORTABLE CHEST 1 VIEW COMPARISON:  Chest x-ray 03/20/2020. FINDINGS: Significant worsening aeration in the right lung, which may reflect worsening atelectasis  and/or consolidation, most evident throughout the right mid to lower lung. Slight rightward shift of cardiomediastinal structures, indicative of substantial right-sided volume loss. Left lung appears relatively clear, with exception of several nodular densities which could represent areas of infectious consolidation, or metastatic lesions. Probable small right pleural effusion. No definite left pleural effusion. No pneumothorax. Heart size is normal. IMPRESSION: 1. Substantially worsened aeration in the right lung, which in large part appears to reflect worsening atelectasis, although there is also likely worsening airspace disease as well. 2. The multiple left-sided pulmonary nodules are again noted, which could be infectious or neoplastic in etiology. Continued attention on follow-up studies is recommended. Electronically Signed   By: Vinnie Langton M.D.   On: 04/04/2020 10:53   DG Swallowing Func-Speech Pathology  Result Date: 03/23/2020 Objective Swallowing Evaluation: Type of Study: MBS-Modified Barium Swallow Study  Patient Details Name: OSEIAS HORSEY MRN: [id number] Date of Birth: 01/07/1961 Today's Date: 03/23/2020 Time: SLP Start Time (ACUTE ONLY): 7829 -SLP Stop Time (ACUTE ONLY): 1320 SLP Time Calculation (min) (ACUTE ONLY): 25 min Past Medical History: Past Medical History: Diagnosis Date . Allergic rhinitis, cause unspecified  . Anxiety state, unspecified  . Asthma   as a child . Chronic airway obstruction, not elsewhere classified  . Esophageal reflux  . History of kidney stones  . Hypertension  . Lumbago  . Other and unspecified hyperlipidemia  . Other chest pain  Past Surgical History: Past Surgical History: Procedure Laterality Date . APPENDECTOMY   . BRONCHIAL BIOPSY  01/07/2020  Procedure: BRONCHIAL BIOPSIES;  Surgeon: Collene Gobble, MD;  Location: [facility] ENDOSCOPY;  Service: Pulmonary;; . BRONCHIAL BRUSHINGS  01/07/2020  Procedure: BRONCHIAL BRUSHINGS;  Surgeon: Collene Gobble, MD;  Location:  [facility] ENDOSCOPY;  Service: Pulmonary;; . BRONCHIAL NEEDLE ASPIRATION BIOPSY  01/07/2020  Procedure: BRONCHIAL NEEDLE ASPIRATION BIOPSIES;  Surgeon: Collene Gobble, MD;  Location: [facility] ENDOSCOPY;  Service: Pulmonary;; . BRONCHIAL WASHINGS  01/07/2020  Procedure: BRONCHIAL WASHINGS;  Surgeon: Collene Gobble, MD;  Location: [facility] ENDOSCOPY;  Service: Pulmonary;; . COLONOSCOPY  15 years ago . HEMOSTASIS CONTROL  01/07/2020  Procedure: HEMOSTASIS CONTROL;  Surgeon: Collene Gobble, MD;  Location: [facility] ENDOSCOPY;  Service: Pulmonary;;  cold saline . NASAL TURBINATE REDUCTION  2002  Dr.Crossley . VASECTOMY   . VIDEO BRONCHOSCOPY WITH ENDOBRONCHIAL NAVIGATION N/A 01/07/2020  Procedure: VIDEO BRONCHOSCOPY WITH ENDOBRONCHIAL NAVIGATION;  Surgeon: Collene Gobble, MD;  Location: [place] ENDOSCOPY;  Service: Pulmonary;  Laterality: N/A; . VIDEO BRONCHOSCOPY WITH ENDOBRONCHIAL ULTRASOUND N/A 01/07/2020  Procedure: VIDEO BRONCHOSCOPY WITH ENDOBRONCHIAL ULTRASOUND;  Surgeon: Collene Gobble, MD;  Location: [place] ENDOSCOPY;  Service: Pulmonary;  Laterality: N/A; HPI: This is a 59 year old male with past medical history of NSCLC, asthma, hypertension, recent admission for septic shock secondary to multifocal pneumonia from 11/19-11/23 who presented to the ED  with persistent shortness of breath, cough and generalized weakness. CXR 03/20/20: Extensive airspace disease throughout the right lung concerning for pneumonia versus changes from radiation treatment. Small right pleural effusion. Findings similar to prior study.  Subjective: pleasant, alert, just had radiation treatment at 11:45am Assessment / Plan / Recommendation CHL IP CLINICAL IMPRESSIONS 03/23/2020 Clinical Impression Patient presents with a mild oral and a mild-moderate pharyngeal dysphagia which appears to be sensorimotor based. Patient has been with dry mouth and sore throat from coughing which he stated has resulted in him not eating that much lately. During oral phase, patient exhbiited  decreased anterior to posterior transit of puree and regular texture boluses as well as 1/2 barium tablet (patient requested to have it split in half). During pharyngeal phase, patient had decreased hyolaryngeal elevation with decreased pharyngeal contraction leading to moderate vallecular sinus residuals with puree solids and regular solids, mild pyriform sinus residuals with puree solids, regular solids and thin liquds. Majority of vallecular residuals cleared with sips of thin liquids and extra/dry swallows. Patient did exhibit one instance of flash penetration with thin liquids of trace amount, as well as silent penetration of trace amount of thin liquids that led to deep penetration that was also not sensed. No aspiration was observed. Pyriform sinus residuals were also impacted by patient's decreased UES opening which was observed with all consistencies. SLP Visit Diagnosis Dysphagia, oropharyngeal phase (R13.12) Attention and concentration deficit following -- Frontal lobe and executive function deficit following -- Impact on safety and function Mild aspiration risk;Risk for inadequate nutrition/hydration   CHL IP TREATMENT RECOMMENDATION 03/23/2020 Treatment Recommendations Therapy as outlined in treatment plan below   Prognosis 03/23/2020 Prognosis for Safe Diet Advancement Good Barriers to Reach Goals -- Barriers/Prognosis Comment -- CHL IP DIET RECOMMENDATION 03/23/2020 SLP Diet Recommendations Regular solids;Thin liquid Liquid Administration via Cup;Straw Medication Administration Whole meds with liquid Compensations Small sips/bites;Slow rate;Follow solids with liquid Postural Changes Seated upright at 90 degrees   CHL IP OTHER RECOMMENDATIONS 03/23/2020 Recommended Consults -- Oral Care Recommendations Oral care BID;Patient independent with oral care Other Recommendations --   CHL IP FOLLOW UP RECOMMENDATIONS 03/23/2020 Follow up Recommendations Other (comment)   CHL IP FREQUENCY AND DURATION  03/23/2020 Speech Therapy Frequency (ACUTE ONLY) min 2x/week Treatment Duration 1 week      CHL IP ORAL PHASE 03/23/2020 Oral Phase Impaired Oral - Pudding Teaspoon -- Oral - Pudding Cup -- Oral - Honey Teaspoon -- Oral - Honey Cup -- Oral - Nectar Teaspoon -- Oral - Nectar Cup -- Oral - Nectar Straw -- Oral - Thin Teaspoon -- Oral - Thin Cup -- Oral - Thin Straw -- Oral - Puree Reduced posterior propulsion;Delayed oral transit;Weak lingual manipulation Oral - Mech Soft -- Oral - Regular Weak lingual manipulation;Delayed oral transit;Reduced posterior propulsion Oral - Multi-Consistency -- Oral - Pill Reduced posterior propulsion;Delayed oral transit;Weak lingual manipulation;Decreased bolus cohesion Oral Phase - Comment --  CHL IP PHARYNGEAL PHASE 03/23/2020 Pharyngeal Phase Impaired Pharyngeal- Pudding Teaspoon -- Pharyngeal -- Pharyngeal- Pudding Cup -- Pharyngeal -- Pharyngeal- Honey Teaspoon -- Pharyngeal -- Pharyngeal- Honey Cup -- Pharyngeal -- Pharyngeal- Nectar Teaspoon -- Pharyngeal -- Pharyngeal- Nectar Cup -- Pharyngeal -- Pharyngeal- Nectar Straw -- Pharyngeal -- Pharyngeal- Thin Teaspoon -- Pharyngeal -- Pharyngeal- Thin Cup Penetration/Aspiration during swallow;Penetration/Apiration after swallow;Pharyngeal residue - pyriform;Compensatory strategies attempted (with notebox) Pharyngeal Material enters airway, remains ABOVE vocal cords then ejected out;Material enters airway, remains ABOVE vocal cords and not ejected out Pharyngeal- Thin Straw Penetration/Aspiration during swallow;Penetration/Apiration after swallow;Pharyngeal  residue - pyriform;Reduced anterior laryngeal mobility Pharyngeal Material enters airway, remains ABOVE vocal cords then ejected out;Material enters airway, remains ABOVE vocal cords and not ejected out Pharyngeal- Puree Reduced pharyngeal peristalsis;Reduced tongue base retraction;Pharyngeal residue - valleculae;Pharyngeal residue - pyriform;Reduced anterior laryngeal mobility  Pharyngeal -- Pharyngeal- Mechanical Soft -- Pharyngeal -- Pharyngeal- Regular Reduced tongue base retraction;Pharyngeal residue - valleculae;Pharyngeal residue - pyriform;Reduced pharyngeal peristalsis;Reduced anterior laryngeal mobility Pharyngeal -- Pharyngeal- Multi-consistency -- Pharyngeal -- Pharyngeal- Pill Delayed swallow initiation-vallecula;Reduced anterior laryngeal mobility Pharyngeal -- Pharyngeal Comment --  CHL IP CERVICAL ESOPHAGEAL PHASE 03/23/2020 Cervical Esophageal Phase Impaired Pudding Teaspoon -- Pudding Cup -- Honey Teaspoon -- Honey Cup -- Nectar Teaspoon -- Nectar Cup -- Nectar Straw -- Thin Teaspoon -- Thin Cup Reduced cricopharyngeal relaxation Thin Straw Reduced cricopharyngeal relaxation Puree Reduced cricopharyngeal relaxation Mechanical Soft -- Regular Reduced cricopharyngeal relaxation Multi-consistency -- Pill Reduced cricopharyngeal relaxation Cervical Esophageal Comment -- Sonia Baller, MA, CCC-SLP Speech Therapy             DG C-ARM BRONCHOSCOPY  Result Date: 04/05/2020 C-ARM BRONCHOSCOPY: Fluoroscopy was utilized by the requesting physician.  No radiographic interpretation.    ASSESSMENT AND PLAN: This is a very pleasant 59 years old white male recently diagnosed with a stage IIb (T3, N0, M0) non-small cell lung cancer, adenocarcinoma presented with perihilar right upper lobe lung mass with suspicious groundglass opacity in the right middle lobe. The patient is not a good surgical candidate for resection according to Dr. Roxan Hockey. The patient underwent a course of concurrent chemoradiation with weekly carboplatin for AUC of 2 and paclitaxel 45 mg/M2 status post 5 cycles.  He has been tolerating this treatment well except for fatigue, mild odynophagia as well as recurrent pneumonia and septicemia.  The patient also has radiation-induced pneumonitis. He was admitted to the hospital several times recently for treatment of recurrent pneumonia.  He finished a course  of tapered steroids and he is currently on antibiotics with Augmentin by Dr. Silas Flood. The patient had repeat CT scan of the chest performed recently.  I personally and independently reviewed the scan images and discussed the results with the patient and his wife today. His scan showed extensive and persistent patchy airspace processes throughout the right lower lobe and right middle lobe with consolidation and worsening consolidation over the anterior medial right upper lobe/middle lobe.  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 I recommended for the patient to continue on observation for now.  It is unclear if the patient has any progressive disease at this point because of the severe consolidation in the area of the previous treatment. His last bronchoscopy showed no evidence of malignancy on the cytology. I will see him back for follow-up visit in 3 months for evaluation and repeat CT scan of the chest for reevaluation of his disease.  The patient is not a great candidate for any consolidation treatment at this point with immunotherapy or systemic chemotherapy. He will continue his current treatment for the pneumonia and follow-up by pulmonary medicine. For the hypokalemia, I will start the patient on potassium supplement with potassium chloride 40 mEq p.o. daily for 7 days. The patient was advised to call immediately if he has any other concerning symptoms in the interval. He was advised to call immediately or go to the emergency department if no improvement in his condition. The patient voices understanding of current disease  status and treatment options and is in agreement with the current care plan.  All questions were answered. The patient knows to call the clinic with any problems, questions or concerns. We can certainly see the patient much sooner if necessary.  The total time  spent in the appointment was 30 minutes.  Disclaimer: This note was dictated with voice recognition software. Similar sounding words can inadvertently be transcribed and may not be corrected upon review.

## 2020-04-22 NOTE — Telephone Encounter (Signed)
Patient wanted to cancel his follow up appointment with Dr. Sondra Come and did not want to reschedule at this time.

## 2020-04-23 ENCOUNTER — Ambulatory Visit: Payer: BC Managed Care – PPO | Admitting: Radiation Oncology

## 2020-04-23 ENCOUNTER — Telehealth: Payer: Self-pay | Admitting: Internal Medicine

## 2020-04-23 NOTE — Telephone Encounter (Signed)
Scheduled follow-up appointments per 12/29 los. Patient is aware.

## 2020-04-23 NOTE — Progress Notes (Signed)
Called and spoke with patient, advised of results/recommendations per Dr. Silas Flood.  He verbalized understanding.  Nothing further needed.

## 2020-04-26 LAB — CULTURE, FUNGUS WITHOUT SMEAR

## 2020-04-27 ENCOUNTER — Telehealth: Payer: Self-pay | Admitting: Pulmonary Disease

## 2020-04-27 MED ORDER — PREDNISONE 20 MG PO TABS: 20.0000 mg | ORAL_TABLET | Freq: Every day | ORAL | 0 refills | Status: DC

## 2020-04-27 NOTE — Telephone Encounter (Signed)
Spoke with wife Santiago Glad, aware of recs.  Nothing further needed at this time- will close encounter.

## 2020-04-27 NOTE — Telephone Encounter (Signed)
Dr. Silas Flood please advise on patient mychart message   Since the steroid has finished, Richard Li seems to be feeling worse. He's coughing more and producing more mucus. He has been running a fever ranging 102.1 to 99.5 since last night. He's also been dizzy more in the mornings when he gets up. Due to the rise in [place] cases he really doesn't want to go to the ER. Would adding the steroid back in help? If you call, please call my number. He doesn't answer his phone. Richard Li [phone]. Thank you.

## 2020-04-27 NOTE — Telephone Encounter (Signed)
Patient complains of increased cough, congestion , more dyspnea , No appetite, more fatigue .  Worse since finishing steroids , last dose 04/21/20.   Wants to restart steroids until ov with Dr. Silas Flood on 05/13/19.

## 2020-04-28 ENCOUNTER — Telehealth: Payer: Self-pay | Admitting: Medical Oncology

## 2020-04-28 ENCOUNTER — Other Ambulatory Visit: Payer: Self-pay | Admitting: Pulmonary Disease

## 2020-04-28 DIAGNOSIS — J189 Pneumonia, unspecified organism: Secondary | ICD-10-CM

## 2020-04-28 NOTE — Telephone Encounter (Signed)
Received  disability form for [facility] to complete and sign. Placed in blue folder to give to [facility].

## 2020-04-29 ENCOUNTER — Telehealth: Payer: Self-pay

## 2020-04-29 NOTE — Telephone Encounter (Signed)
Pt wife LM stating [employer] [facility] has not received his paperwork from [place].  I called the pts wife back and advised it was faxed and confirmation was received. I also shared she may want to give them at least 24hrs for their systems to update as this was a 40 page documents. She expressed understanding of this information.

## 2020-04-30 ENCOUNTER — Other Ambulatory Visit: Payer: Self-pay

## 2020-04-30 ENCOUNTER — Ambulatory Visit (INDEPENDENT_AMBULATORY_CARE_PROVIDER_SITE_OTHER): Payer: BC Managed Care – PPO | Admitting: Family Medicine

## 2020-04-30 ENCOUNTER — Encounter: Payer: Self-pay | Admitting: Family Medicine

## 2020-04-30 DIAGNOSIS — F411 Generalized anxiety disorder: Secondary | ICD-10-CM

## 2020-04-30 DIAGNOSIS — C3491 Malignant neoplasm of unspecified part of right bronchus or lung: Secondary | ICD-10-CM | POA: Diagnosis not present

## 2020-04-30 DIAGNOSIS — J189 Pneumonia, unspecified organism: Secondary | ICD-10-CM

## 2020-04-30 MED ORDER — LORAZEPAM 1 MG PO TABS: ORAL_TABLET | ORAL | 2 refills | Status: DC

## 2020-04-30 NOTE — Patient Instructions (Signed)
Nice to see you! Discontinue trazodone and losartan.  You may continue lorazepam as needed.  See me again in about 3 months.

## 2020-05-01 MED ORDER — AMOXICILLIN-POT CLAVULANATE 875-125 MG PO TABS: 1.0000 | ORAL_TABLET | Freq: Two times a day (BID) | ORAL | 0 refills | Status: DC

## 2020-05-01 NOTE — Telephone Encounter (Signed)
Antibiotic sent to pharmacy by Dr. Silas Flood.  Nothing further needed.

## 2020-05-01 NOTE — Telephone Encounter (Signed)
Called and spoke with Patient's Wife Santiago Glad.  Santiago Glad stated Patient had a very bad night with increased coughing. Santiago Glad stated Patient is having a increased productive cough, with yellow/green sputum. Santiago Glad stated sputum still has a infection smell. Patient is coughing hard and at times is hard to catch his breath. Patient finished antibiotic 04/28/20. Patient is taking Mucinex DM and Delsym to help with cough, but it is not working. Santiago Glad wanted to know if it would help the Patient to more antibiotic?  Message routed to Dr. Silas Flood to advise   Current Outpatient Medications on File Prior to Visit  Medication Sig Dispense Refill  . acetaminophen (TYLENOL) 500 MG tablet Take 1,000 mg by mouth every 6 (six) hours as needed for mild pain.    [name] dextromethorphan (DELSYM) 30 MG/5ML liquid Take 30 mg by mouth at bedtime as needed for cough.    . fenofibrate 160 MG tablet Take 1 tablet (160 mg total) by mouth daily. 90 tablet 3  . folic acid (FOLVITE) 1 MG tablet Take 1 tablet (1 mg total) by mouth daily. 30 tablet 2  . Ipratropium-Albuterol (COMBIVENT RESPIMAT) 20-100 MCG/ACT AERS respimat Inhale 1-2 puffs into the lungs every 6 (six) hours as needed for wheezing. 4 g 3  . Multiple Vitamin (MULTIVITAMIN WITH MINERALS) TABS tablet Take 1 tablet by mouth daily.    . potassium chloride SA (KLOR-CON) 20 MEQ tablet Take 2 tablets (40 mEq total) by mouth daily. 14 tablet 0  . prochlorperazine (COMPAZINE) 10 MG tablet TAKE 1 TABLET BY MOUTH EVERY 6 HOURS AS NEEDED FOR NAUSEA FOR VOMITING (Patient taking differently: Take 10 mg by mouth every 6 (six) hours as needed for nausea or vomiting.) 30 tablet 0  . vitamin B-12 1000 MCG tablet Take 1 tablet (1,000 mcg total) by mouth daily. 30 tablet 0   Current Facility-Administered Medications on File Prior to Visit  Medication Dose Route Frequency Provider Last Rate Last Admin  . 0.9 %  sodium chloride infusion   Intravenous Continuous Curt Bears, MD   Stopped  at 03/05/20 1410

## 2020-05-03 ENCOUNTER — Encounter: Payer: Self-pay | Admitting: Family Medicine

## 2020-05-03 DIAGNOSIS — J189 Pneumonia, unspecified organism: Secondary | ICD-10-CM | POA: Insufficient documentation

## 2020-05-03 NOTE — Assessment & Plan Note (Signed)
Likely post obstructive 2/2 to malignancy.  Seeing pulmonology.  Recently completed course of augmentin.   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

## 2020-05-03 NOTE — Assessment & Plan Note (Signed)
He will continue on lorazepam as needed for now.

## 2020-05-03 NOTE — Progress Notes (Signed)
Richard Li - 60 y.o. male MRN [id number]  Date of birth: 10/18/1960  Subjective Chief Complaint  Patient presents with  . Hospitalization Follow-up    HPI Richard Li is a 60 y/o male here today for hospital f/u.  He has a history of COPD, anxiety, and lung cancer.  He has been admitted multiple times over the past couple of months due to recurrent pneumonia.  Has associated sepsis with admission November.  Latest admission treated with cefepime and vanc initially and switched to PO augmentin.  He was also treated with steroids.  He was transfused 1 unit PRBC due to Hgb of 8 during hospitalization.  Discharged with H96 and folic acid supplementation.  Repeat CBC on 12/29 with hgb 11.0.  Had f/u with pulmonology and recurrent pneumonia though to be due to post obstructive cause related to malignancy.    He continues on prednisone until follow up with pulmonology in a couple of weeks.    Continues on lorazepam for anxiety and feeling of dyspnea.      ROS:  A comprehensive ROS was completed and negative except as noted per HPI  No Known Allergies  Past Medical History:  Diagnosis Date  . Allergic rhinitis, cause unspecified   . Anxiety state, unspecified   . Asthma    as a child  . Chronic airway obstruction, not elsewhere classified   . Esophageal reflux   . History of kidney stones   . Hypertension   . Lumbago   . lung ca dx'd 12/2019  . Other and unspecified hyperlipidemia   . Other chest pain     Past Surgical History:  Procedure Laterality Date  . APPENDECTOMY    . BRONCHIAL BIOPSY  01/07/2020   Procedure: BRONCHIAL BIOPSIES;  Surgeon: Collene Gobble, MD;  Location: [facility] ENDOSCOPY;  Service: Pulmonary;;  . BRONCHIAL BIOPSY  04/05/2020   Procedure: BRONCHIAL BIOPSIES;  Surgeon: Rigoberto Noel, MD;  Location: [place];  Service: Cardiopulmonary;;  . BRONCHIAL BRUSHINGS  01/07/2020   Procedure: BRONCHIAL BRUSHINGS;  Surgeon: Collene Gobble, MD;  Location: [facility] ENDOSCOPY;   Service: Pulmonary;;  . BRONCHIAL NEEDLE ASPIRATION BIOPSY  01/07/2020   Procedure: BRONCHIAL NEEDLE ASPIRATION BIOPSIES;  Surgeon: Collene Gobble, MD;  Location[facility] ENDOSCOPY;  Service: Pulmonary;;  . BRONCHIAL WASHINGS  01/07/2020   Procedure: BRONCHIAL WASHINGS;  Surgeon: Collene Gobble, MD;  Location: [facility] ENDOSCOPY;  Service: Pulmonary;;  . BRONCHIAL WASHINGS  04/05/2020   Procedure: BRONCHIAL WASHINGS;  Surgeon: Rigoberto Noel, MD;  Location: [place];  Service: Cardiopulmonary;;  . COLONOSCOPY  15 years ago  . HEMOSTASIS CONTROL  01/07/2020   Procedure: HEMOSTASIS CONTROL;  Surgeon: Collene Gobble, MD;  Location: [facility] ENDOSCOPY;  Service: Pulmonary;;  cold saline  . NASAL TURBINATE REDUCTION  2002   Dr.Crossley  . VASECTOMY    . VIDEO BRONCHOSCOPY Right 04/05/2020   Procedure: VIDEO BRONCHOSCOPY WITH FLUORO;  Surgeon: Rigoberto Noel, MD;  Location: [place];  Service: Cardiopulmonary;  Laterality: Right;  [name] VIDEO BRONCHOSCOPY WITH ENDOBRONCHIAL NAVIGATION N/A 01/07/2020   Procedure: VIDEO BRONCHOSCOPY WITH ENDOBRONCHIAL NAVIGATION;  Surgeon: Collene Gobble, MD;  Location: [place] ENDOSCOPY;  Service: Pulmonary;  Laterality: N/A;  . VIDEO BRONCHOSCOPY WITH ENDOBRONCHIAL ULTRASOUND N/A 01/07/2020   Procedure: VIDEO BRONCHOSCOPY WITH ENDOBRONCHIAL ULTRASOUND;  Surgeon: Collene Gobble, MD;  Location: [place] ENDOSCOPY;  Service: Pulmonary;  Laterality: N/A;    Social History   Socioeconomic History  . Marital status: Married  Spouse name: karen Fowles  . Number of children: Not on file  . Years of education: Not on file  . Highest education level: Not on file  Occupational History  . Occupation: truck Education administrator: [employer]  Tobacco Use  . Smoking status: Former Smoker    Packs/day: 2.00    Years: 28.00    Pack years: 56.00    Types: Cigarettes    Quit date: 04/25/2005    Years since quitting: 15.0  . Smokeless tobacco: Never Used  Vaping Use  . Vaping Use: Never  used  Substance and Sexual Activity  . Alcohol use: Yes    Alcohol/week: 12.0 standard drinks    Types: 12 Cans of beer per week    Comment: social use  . Drug use: No  . Sexual activity: Not on file  Other Topics Concern  . Not on file  Social History Narrative  . Not on file   Social Determinants of Health   Financial Resource Strain: Not on file  Food Insecurity: Not on file  Transportation Needs: Not on file  Physical Activity: Not on file  Stress: Not on file  Social Connections: Not on file    Family History  Problem Relation Age of Onset  . Breast cancer Mother   . Cancer Father   . Heart attack Brother   . Emphysema Maternal Uncle   . COPD Maternal Uncle   . Heart disease Maternal Uncle   . Colon cancer Neg Hx     Health Maintenance  Topic Date Due  . Hepatitis C Screening  Never done  . COVID-19 Vaccine (1) 05/16/2020 (Originally 12/08/1972)  . TETANUS/TDAP  12/11/2021  . COLONOSCOPY (Pts 45-28yrs Insurance coverage will need to be confirmed)  04/23/2023  . INFLUENZA VACCINE  Completed  . HIV Screening  Completed     ----------------------------------------------------------------------------------------------------------------------------------------------------------------------------------------------------------------- Physical Exam BP 96/62 (BP Location: Left Arm, Patient Position: Sitting, Cuff Size: Normal)   Pulse (!) 129   Wt 160 lb (72.6 kg)   SpO2 94%   BMI 22.96 kg/m   Physical Exam Constitutional:      Comments: Think male, nad  Eyes:     General: No scleral icterus. Cardiovascular:     Rate and Rhythm: Normal rate and regular rhythm.  Pulmonary:     Effort: Pulmonary effort is normal.     Breath sounds: Normal breath sounds.  Skin:    General: Skin is warm and dry.  Neurological:     General: No focal deficit present.  Psychiatric:        Mood and Affect: Mood normal.        Behavior: Behavior normal.      ------------------------------------------------------------------------------------------------------------------------------------------------------------------------------------------------------------------- Assessment and Plan  Anxiety state He will continue on lorazepam as needed for now.   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  Reports having repeat scans scheduled in a few weeks.   Recurrent pneumonia Likely post obstructive 2/2 to malignancy.  Seeing pulmonology.  Recently completed course of augmentin.   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     Meds ordered this encounter  Medications  . LORazepam (ATIVAN) 1 MG tablet    Sig: TAKE 1 TABLET BY MOUTH THREE TIMES DAILY AS NEEDED FOR ANXIETY    Dispense:  90 tablet    Refill:  2    Return in about 3 months (around 07/29/2020) for HTN/Anxiety.    This visit occurred during  the SARS-CoV-2 public health emergency.  Safety protocols were in place, including screening questions prior to the visit, additional usage of staff PPE, and extensive cleaning of exam room while observing appropriate contact time as indicated for disinfecting solutions.

## 2020-05-03 NOTE — Assessment & Plan Note (Signed)
Management per oncology.  Reports having repeat scans scheduled in a few weeks.

## 2020-05-06 ENCOUNTER — Other Ambulatory Visit: Payer: Self-pay | Admitting: Family Medicine

## 2020-05-06 ENCOUNTER — Other Ambulatory Visit: Payer: Self-pay | Admitting: Pulmonary Disease

## 2020-05-06 DIAGNOSIS — F411 Generalized anxiety disorder: Secondary | ICD-10-CM

## 2020-05-07 ENCOUNTER — Telehealth: Payer: Self-pay | Admitting: Family Medicine

## 2020-05-07 NOTE — Telephone Encounter (Signed)
Patients wife called, stated that patients blood pressure had been running low , it was 90-46 and now it is 100/55 , patients wife stated that patient has lung cancer & pneumonia and that he has been coughing a lot & thinks patient could have either pulled a muscle in his heart or it could be his heart. Went to speak with triage and since we have no more available appointments for today triage said to let patient know to go to the ER. Patients wife was agreeable and understanding. AM

## 2020-05-08 ENCOUNTER — Emergency Department (HOSPITAL_COMMUNITY): Payer: BC Managed Care – PPO

## 2020-05-08 ENCOUNTER — Inpatient Hospital Stay (HOSPITAL_COMMUNITY)
Admission: EM | Admit: 2020-05-08 | Discharge: 2020-05-11 | DRG: 871 | Disposition: A | Discharge status: Home / Self Care | Payer: BC Managed Care – PPO | Attending: Internal Medicine | Admitting: Internal Medicine

## 2020-05-08 ENCOUNTER — Encounter (HOSPITAL_COMMUNITY): Payer: Self-pay

## 2020-05-08 ENCOUNTER — Other Ambulatory Visit: Payer: Self-pay

## 2020-05-08 DIAGNOSIS — Z803 Family history of malignant neoplasm of breast: Secondary | ICD-10-CM

## 2020-05-08 DIAGNOSIS — D509 Iron deficiency anemia, unspecified: Secondary | ICD-10-CM | POA: Diagnosis present

## 2020-05-08 DIAGNOSIS — E785 Hyperlipidemia, unspecified: Secondary | ICD-10-CM | POA: Diagnosis present

## 2020-05-08 DIAGNOSIS — R0602 Shortness of breath: Secondary | ICD-10-CM

## 2020-05-08 DIAGNOSIS — R9389 Abnormal findings on diagnostic imaging of other specified body structures: Secondary | ICD-10-CM

## 2020-05-08 DIAGNOSIS — Z9221 Personal history of antineoplastic chemotherapy: Secondary | ICD-10-CM

## 2020-05-08 DIAGNOSIS — Z79899 Other long term (current) drug therapy: Secondary | ICD-10-CM | POA: Diagnosis not present

## 2020-05-08 DIAGNOSIS — A419 Sepsis, unspecified organism: Secondary | ICD-10-CM | POA: Diagnosis present

## 2020-05-08 DIAGNOSIS — C3401 Malignant neoplasm of right main bronchus: Secondary | ICD-10-CM

## 2020-05-08 DIAGNOSIS — E876 Hypokalemia: Secondary | ICD-10-CM | POA: Diagnosis present

## 2020-05-08 DIAGNOSIS — J860 Pyothorax with fistula: Secondary | ICD-10-CM | POA: Diagnosis present

## 2020-05-08 DIAGNOSIS — Z8249 Family history of ischemic heart disease and other diseases of the circulatory system: Secondary | ICD-10-CM

## 2020-05-08 DIAGNOSIS — C3411 Malignant neoplasm of upper lobe, right bronchus or lung: Secondary | ICD-10-CM | POA: Diagnosis present

## 2020-05-08 DIAGNOSIS — Z20822 Contact with and (suspected) exposure to covid-19: Secondary | ICD-10-CM | POA: Diagnosis present

## 2020-05-08 DIAGNOSIS — Z825 Family history of asthma and other chronic lower respiratory diseases: Secondary | ICD-10-CM

## 2020-05-08 DIAGNOSIS — Y842 Radiological procedure and radiotherapy as the cause of abnormal reaction of the patient, or of later complication, without mention of misadventure at the time of the procedure: Secondary | ICD-10-CM | POA: Diagnosis present

## 2020-05-08 DIAGNOSIS — I959 Hypotension, unspecified: Secondary | ICD-10-CM | POA: Diagnosis present

## 2020-05-08 DIAGNOSIS — Z87891 Personal history of nicotine dependence: Secondary | ICD-10-CM | POA: Diagnosis not present

## 2020-05-08 DIAGNOSIS — D638 Anemia in other chronic diseases classified elsewhere: Secondary | ICD-10-CM | POA: Diagnosis present

## 2020-05-08 DIAGNOSIS — J189 Pneumonia, unspecified organism: Secondary | ICD-10-CM | POA: Diagnosis present

## 2020-05-08 DIAGNOSIS — D72829 Elevated white blood cell count, unspecified: Secondary | ICD-10-CM | POA: Diagnosis not present

## 2020-05-08 DIAGNOSIS — Z87442 Personal history of urinary calculi: Secondary | ICD-10-CM | POA: Diagnosis not present

## 2020-05-08 DIAGNOSIS — J700 Acute pulmonary manifestations due to radiation: Secondary | ICD-10-CM | POA: Diagnosis present

## 2020-05-08 DIAGNOSIS — I10 Essential (primary) hypertension: Secondary | ICD-10-CM | POA: Diagnosis present

## 2020-05-08 DIAGNOSIS — J470 Bronchiectasis with acute lower respiratory infection: Secondary | ICD-10-CM | POA: Diagnosis present

## 2020-05-08 LAB — CBC
HCT: 27.3 % — ABNORMAL LOW (ref 39.0–52.0)
Hemoglobin: 8 g/dL — ABNORMAL LOW (ref 13.0–17.0)
MCH: 26 pg (ref 26.0–34.0)
MCHC: 29.3 g/dL — ABNORMAL LOW (ref 30.0–36.0)
MCV: 88.6 fL (ref 80.0–100.0)
Platelets: 703 10*3/uL — ABNORMAL HIGH (ref 150–400)
RBC: 3.08 MIL/uL — ABNORMAL LOW (ref 4.22–5.81)
RDW: 16.2 % — ABNORMAL HIGH (ref 11.5–15.5)
WBC: 19.4 10*3/uL — ABNORMAL HIGH (ref 4.0–10.5)
nRBC: 0 % (ref 0.0–0.2)

## 2020-05-08 LAB — PROTIME-INR
INR: 1.2 (ref 0.8–1.2)
Prothrombin Time: 14.9 seconds (ref 11.4–15.2)

## 2020-05-08 LAB — COMPREHENSIVE METABOLIC PANEL
ALT: 15 U/L (ref 0–44)
AST: 17 U/L (ref 15–41)
Albumin: 2.3 g/dL — ABNORMAL LOW (ref 3.5–5.0)
Alkaline Phosphatase: 95 U/L (ref 38–126)
Anion gap: 15 (ref 5–15)
BUN: 13 mg/dL (ref 6–20)
CO2: 23 mmol/L (ref 22–32)
Calcium: 9.4 mg/dL (ref 8.9–10.3)
Chloride: 98 mmol/L (ref 98–111)
Creatinine, Ser: 0.72 mg/dL (ref 0.61–1.24)
GFR, Estimated: >60 mL/min (ref >60)
Glucose, Bld: 111 mg/dL — ABNORMAL HIGH (ref 70–99)
Potassium: 3 mmol/L — ABNORMAL LOW (ref 3.5–5.1)
Sodium: 136 mmol/L (ref 135–145)
Total Bilirubin: 0.3 mg/dL (ref 0.3–1.2)
Total Protein: 6.8 g/dL (ref 6.5–8.1)

## 2020-05-08 LAB — PROCALCITONIN: Procalcitonin: 0.23 ng/mL

## 2020-05-08 LAB — URINALYSIS, ROUTINE W REFLEX MICROSCOPIC
Bilirubin Urine: NEGATIVE
Glucose, UA: NEGATIVE mg/dL
Hgb urine dipstick: NEGATIVE
Ketones, ur: NEGATIVE mg/dL
Leukocytes,Ua: NEGATIVE
Nitrite: NEGATIVE
Protein, ur: NEGATIVE mg/dL
Specific Gravity, Urine: 1.014 (ref 1.005–1.030)
pH: 6 (ref 5.0–8.0)

## 2020-05-08 LAB — TROPONIN I (HIGH SENSITIVITY)
Troponin I (High Sensitivity): 12 ng/L (ref <18)
Troponin I (High Sensitivity): 4 ng/L (ref <18)

## 2020-05-08 LAB — RESP PANEL BY RT-PCR (RSV, FLU A&B, COVID)  RVPGX2
Influenza A by PCR: NEGATIVE
Influenza B by PCR: NEGATIVE
Resp Syncytial Virus by PCR: NEGATIVE
SARS Coronavirus 2 by RT PCR: NEGATIVE

## 2020-05-08 LAB — LACTIC ACID, PLASMA
Lactic Acid, Venous: 1.1 mmol/L (ref 0.5–1.9)
Lactic Acid, Venous: 1 mmol/L (ref 0.5–1.9)

## 2020-05-08 LAB — APTT: aPTT: 36 seconds (ref 24–36)

## 2020-05-08 IMAGING — DX DG CHEST 2V
2 series · 2 of 2 positions shown · non-contrast
Comparison: [DATE], [DATE]

CLINICAL DATA: Shortness of breath. Trach cough. History of lung
cancer.

EXAM:
CHEST - 2 VIEW

[chest pa]
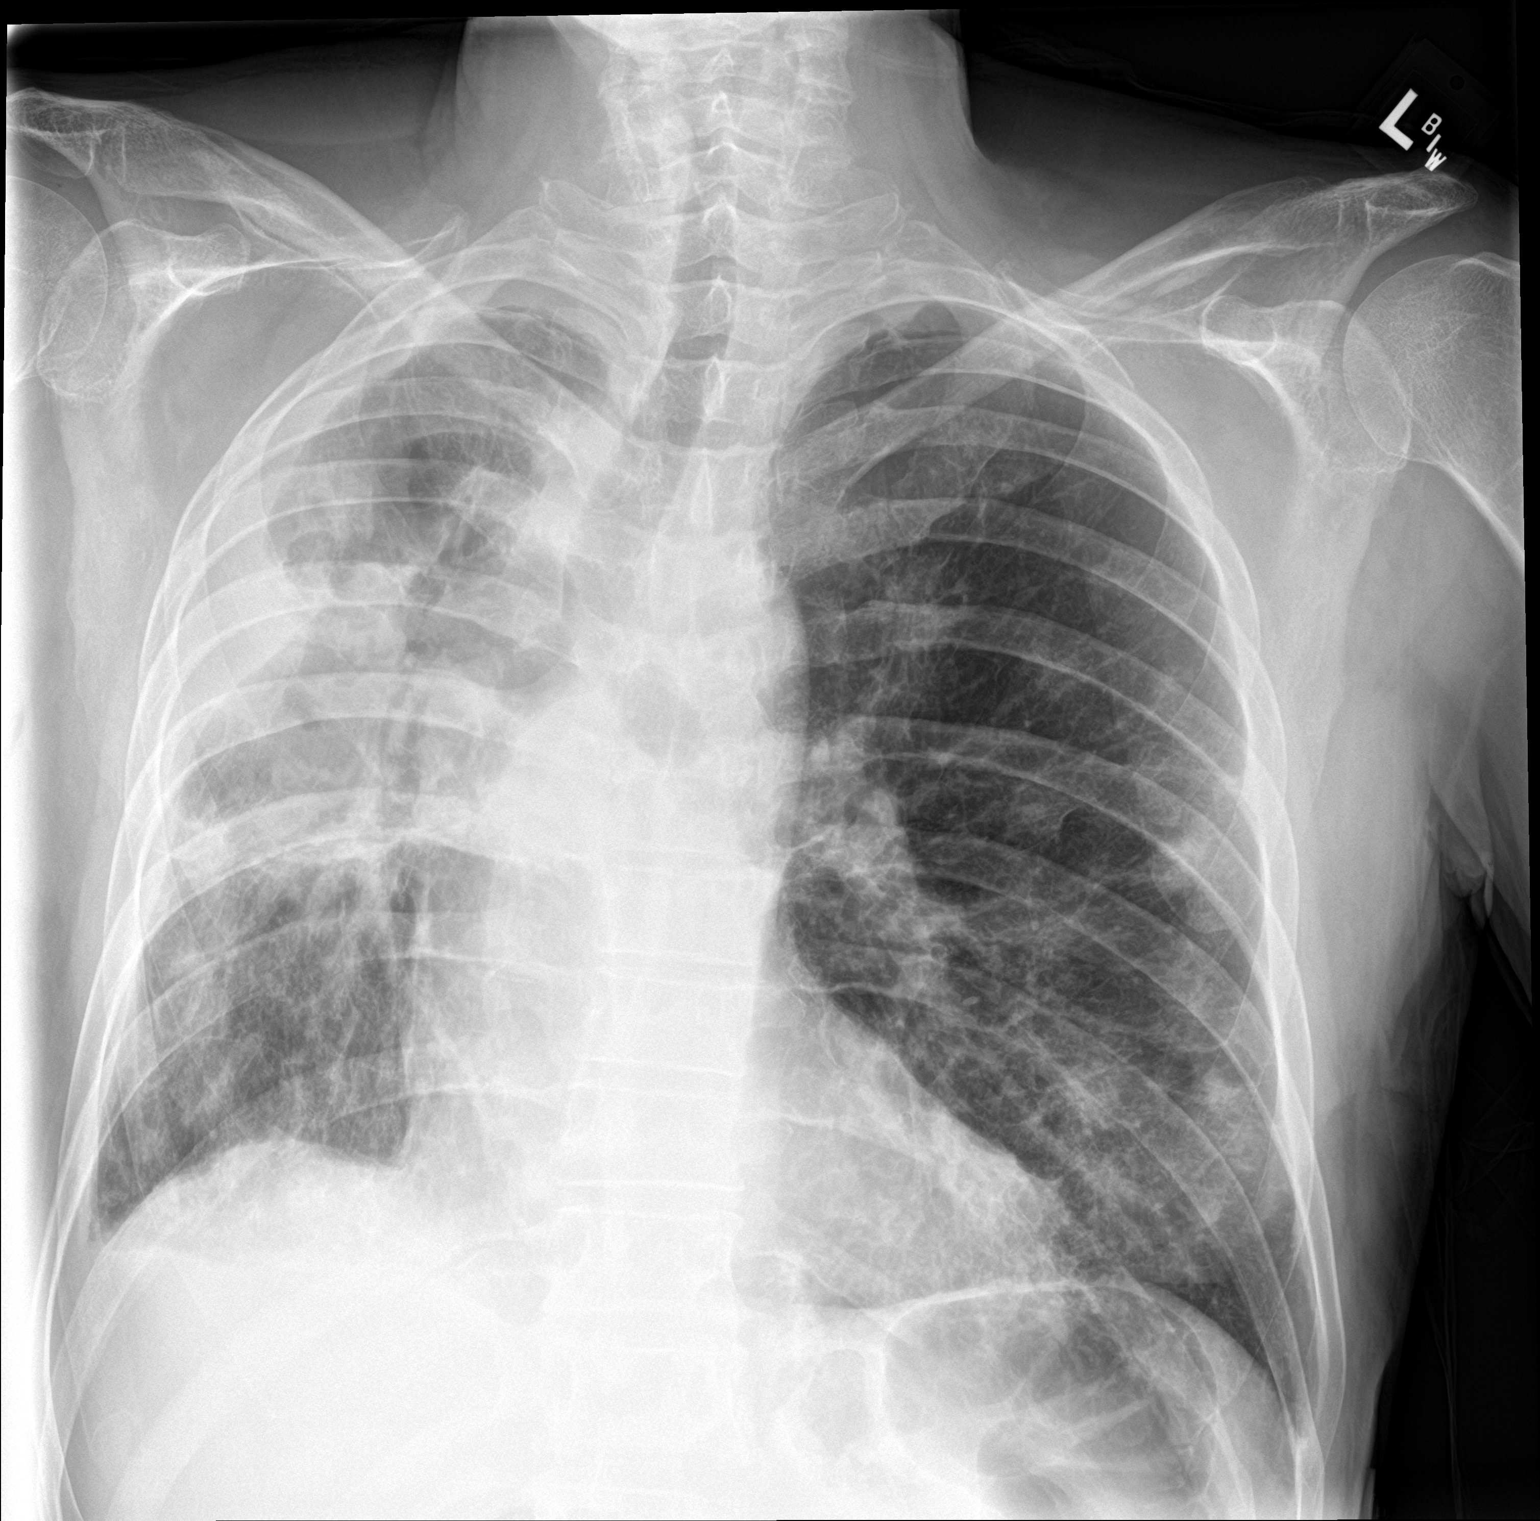

[chest lat]
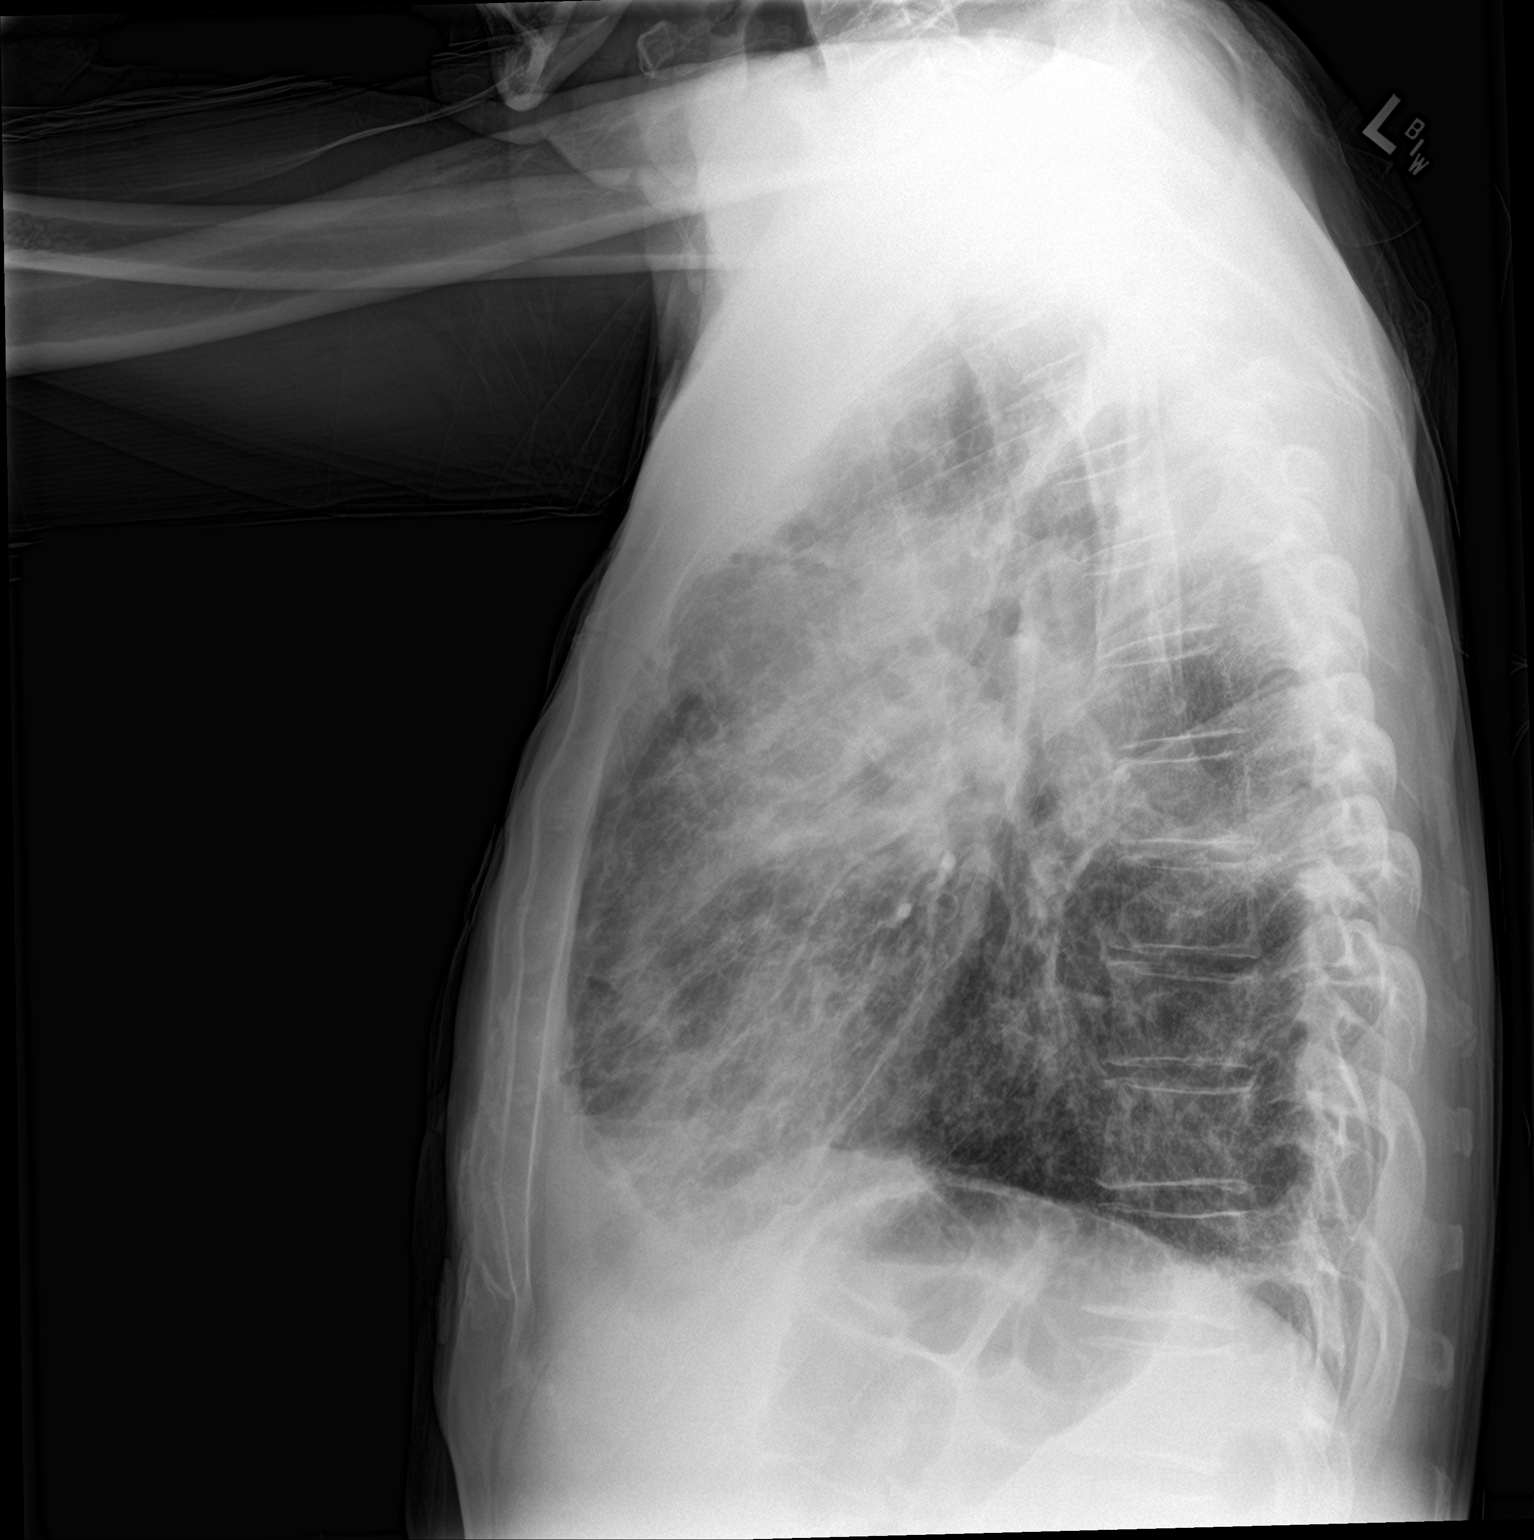

[2 of 2 positions shown; findings below may reference images not displayed]

FINDINGS: Irregular cavitary right perihilar mass with adjacent airspace
opacity. The degree of adjacent airspace opacity has slightly
improved from prior. Improving aeration within the right lower lobe.
Increasing patchy interstitial opacities within the left mid to
lower lung fields. Small right pleural effusion. No pneumothorax.
IMPRESSION: 1. Increasing patchy interstitial opacities within the left mid to
lower lung fields, suspicious for multifocal infection.
2. Improving aeration of the right lower lobe.
3. Small right pleural effusion.
4. Persistent cavitary right perihilar mass with adjacent airspace
opacity. Degree of airspace opacity has slightly improved.

## 2020-05-08 IMAGING — CT CT ANGIO CHEST
2 of 6 series · 14 of 46 positions shown · IV contrast (omnipaque)
Comparison: [DATE]
COMPARISON: [DATE]

Addendum:
CLINICAL DATA: Shortness of breath

EXAM:
CT ANGIOGRAPHY CHEST WITH CONTRAST
TECHNIQUE: Multidetector CT imaging of the chest was performed using the
standard protocol during bolus administration of intravenous
contrast. Multiplanar CT image reconstructions and MIPs were
obtained to evaluate the vascular anatomy.
CONTRAST:  64mL OMNIPAQUE IOHEXOL 350 MG/ML SOLN

[Series 6: thins · axial · 0.80mm/px · z∈[+844,+1159]mm · 11 of 363 slices shown]
[im 32/363  lung]
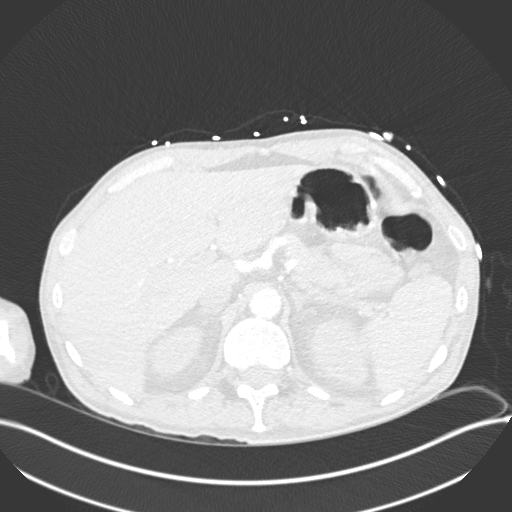
[im 63/363  soft-tissue]
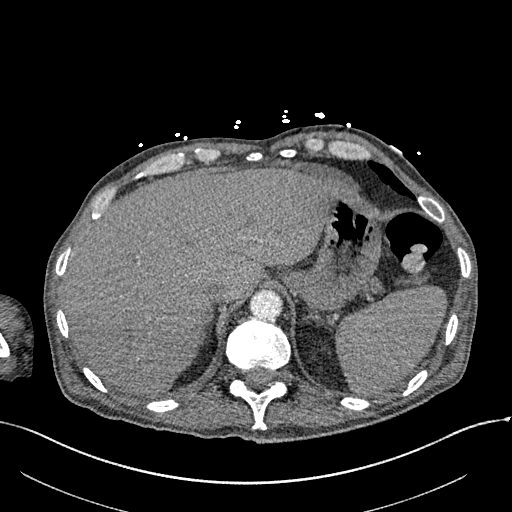
[im 95/363  lung]
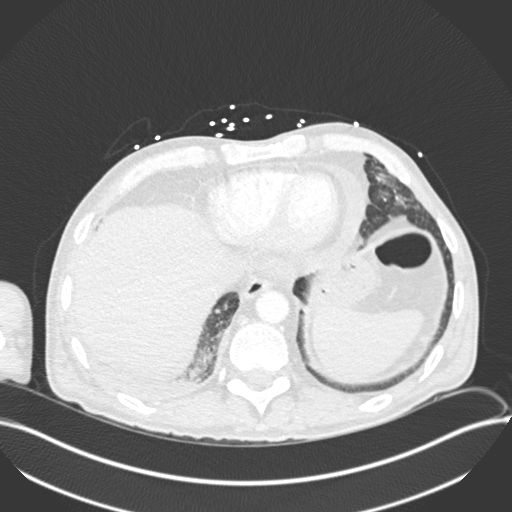
[im 126/363  soft-tissue]
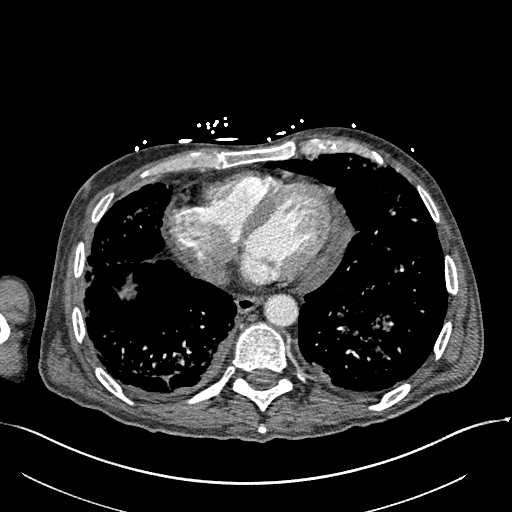
[im 158/363  lung]
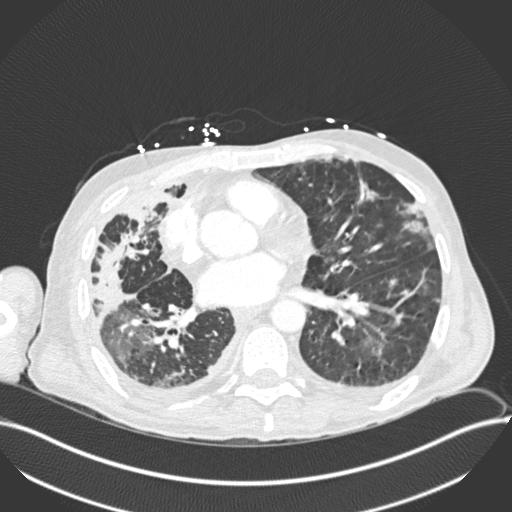
[im 189/363  soft-tissue]
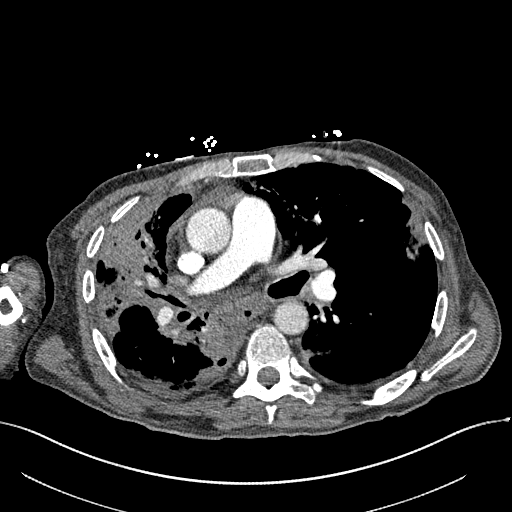
[im 221/363  lung]
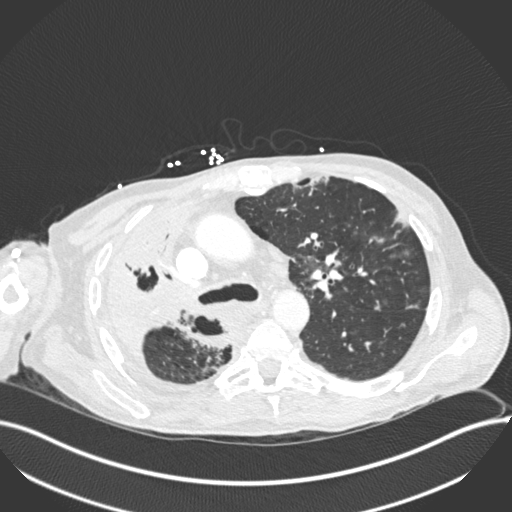
[im 252/363  soft-tissue]
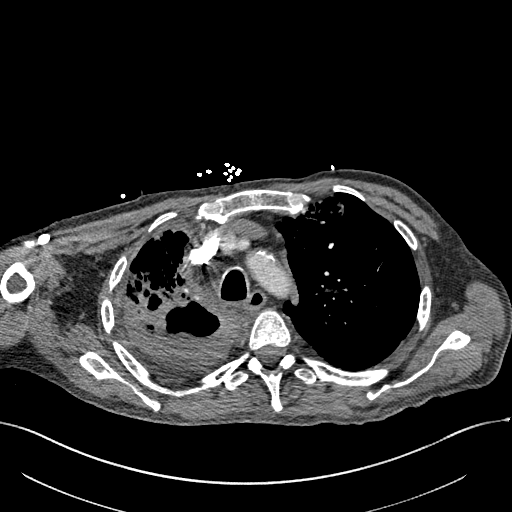
[im 284/363  lung]
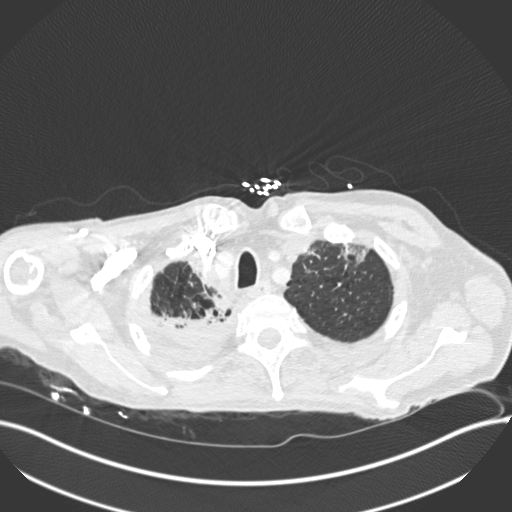
[im 315/363  soft-tissue]
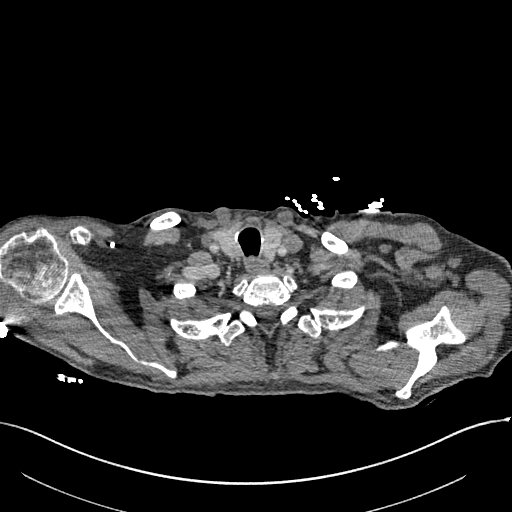
[im 347/363  lung]
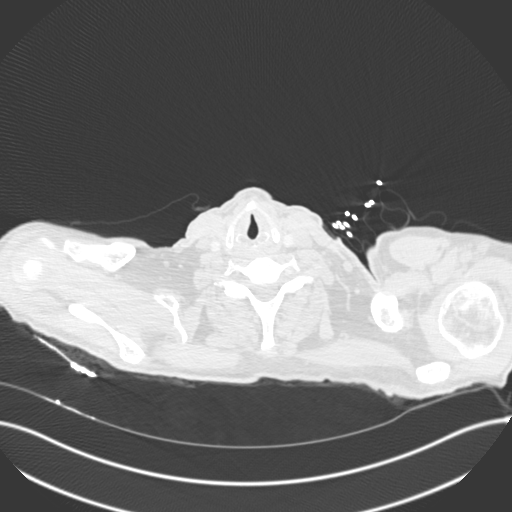

[Series 8: coronal mpr · coronal · 0.71mm/px · 3 of 151 slices shown]
[im 38/151  soft-tissue]
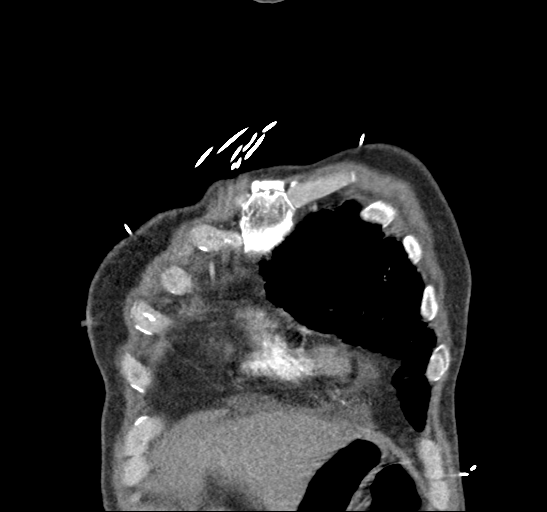
[im 76/151  soft-tissue]
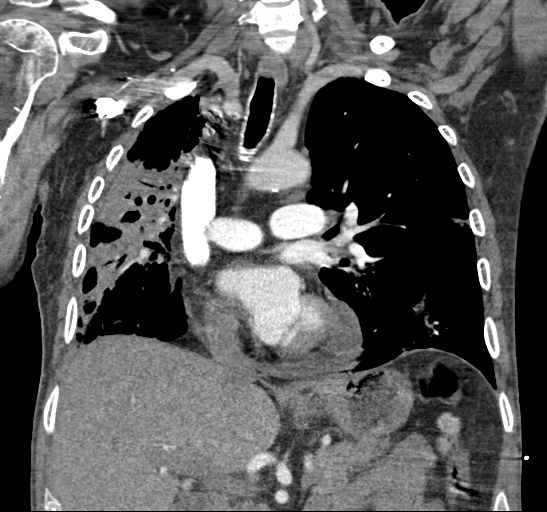
[im 113/151  soft-tissue]
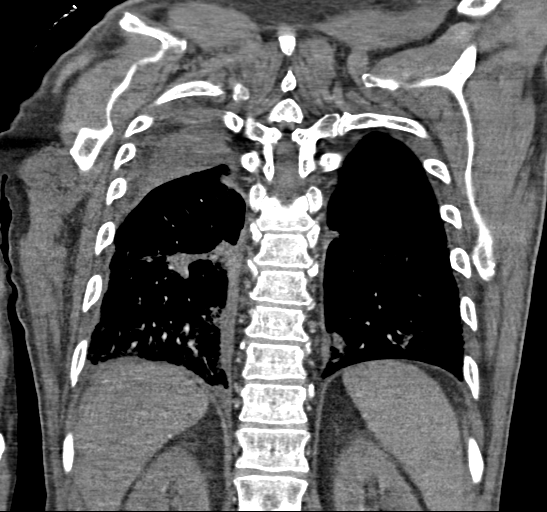

[14 of 46 positions shown; findings below may reference images not displayed]

FINDINGS: Cardiovascular: Normal caliber thoracic aorta. Normal heart size
with small to moderate pericardial effusion that is similar to the
prior study. Scattered mild aortic atherosclerosis.

No sign of pulmonary embolism. Study quality is adequate though
mildly limited by bolus timing

Mediastinum/Nodes: No thoracic inlet, axillary or LEFT hilar
adenopathy. Fullness of soft tissue about the LEFT infrahilar region
and subcarinal region with similar appearance to recent imaging.
Subcarinal nodal tissue approximately 1.5 cm short axis as compared
to 1.6 cm on the prior study.

Lungs/Pleura: Cavitary area in the RIGHT upper lobe extending to the
RIGHT lung apex from the RIGHT hilum in communication with the RIGHT
mainstem bronchus best seen on image 46 of series 7. Small amount of
fluid dependently within this location. The cavity measuring 4.9 x
2.7 cm as compared to 5.1 x 3.7 cm. Consolidative changes in the
RIGHT upper and RIGHT middle lobes with bronchiectatic changes with
similar appearance. Soft tissue/consolidative process in the medial
RIGHT lung base in the as ago esophageal recess measuring
approximately 3.6 x 2.8 cm is stable compared to previous imaging.

Since the prior study there is been worsening of ground-glass
outside of areas of lung involved with post treatment changes with
diffuse ground-glass nodularity throughout the LEFT chest with
peripheral predominance and basilar predominance.

Small pleural effusions and or mild pleural thickening with slight
increase bilaterally.

Upper Abdomen: Incidental imaging of upper abdominal contents
without acute process.

Musculoskeletal: No acute musculoskeletal process. Question of
permeative changes in the RIGHT proximal humerus about the humeral
head. Not evident on more remote radiographs.

Review of the MIP images confirms the above findings.
IMPRESSION: 1. No evidence of pulmonary embolism.
2. Slightly smaller size of bronchopleural fistula in the RIGHT
upper lobe amidst post treatment and consolidative changes in the
RIGHT chest.
3. Worsening of potential multifocal infection, correlate with any
history of [S8] infection given appearance
4. The appearance of multifocal pneumonia could also be related to a
large bronchopleural communication, RIGHT mainstem bronchus in
communication with cavitary focus in the RIGHT upper lobe and
dissemination subsequently of infected and or inflammatory material
throughout bronchi in the chest resulting in pneumonitis.
5. Question of permeative changes in the RIGHT proximal humerus
about the humeral head. Not evident on more remote radiographs.
Correlation with any symptoms in this area suggested. Subtle changes
seen at the margin of images from [facility]. Dedicated humeral
imaging may be helpful. Primary differential consideration given
relative chronicity would be aggressive osteopenia. Correlate with
any pain that might indicate infection. Aggressive osteopenia could
also be seen with limited use of the RIGHT upper extremity, imply
based on arm position.
6. Small to moderate pericardial effusion, similar to the prior
study.
7. Small pleural effusions and or mild pleural thickening with
slight increase bilaterally.
8. Aortic atherosclerosis.

Call is out to the referring provider to further discuss findings in
the above case.

Aortic Atherosclerosis ([S8]-[S8]).

ADDENDUM:
These results were called by telephone at the time of interpretation
on [DATE] at [DATE] to provider [facility] is, who
verbally acknowledged these results.

*** End of Addendum ***
FINDINGS: Cardiovascular: Normal caliber thoracic aorta. Normal heart size
with small to moderate pericardial effusion that is similar to the
prior study. Scattered mild aortic atherosclerosis.

No sign of pulmonary embolism. Study quality is adequate though
mildly limited by bolus timing

Mediastinum/Nodes: No thoracic inlet, axillary or LEFT hilar
adenopathy. Fullness of soft tissue about the LEFT infrahilar region
and subcarinal region with similar appearance to recent imaging.
Subcarinal nodal tissue approximately 1.5 cm short axis as compared
to 1.6 cm on the prior study.

Lungs/Pleura: Cavitary area in the RIGHT upper lobe extending to the
RIGHT lung apex from the RIGHT hilum in communication with the RIGHT
mainstem bronchus best seen on image 46 of series 7. Small amount of
fluid dependently within this location. The cavity measuring 4.9 x
2.7 cm as compared to 5.1 x 3.7 cm. Consolidative changes in the
RIGHT upper and RIGHT middle lobes with bronchiectatic changes with
similar appearance. Soft tissue/consolidative process in the medial
RIGHT lung base in the as ago esophageal recess measuring
approximately 3.6 x 2.8 cm is stable compared to previous imaging.

Since the prior study there is been worsening of ground-glass
outside of areas of lung involved with post treatment changes with
diffuse ground-glass nodularity throughout the LEFT chest with
peripheral predominance and basilar predominance.

Small pleural effusions and or mild pleural thickening with slight
increase bilaterally.

Upper Abdomen: Incidental imaging of upper abdominal contents
without acute process.

Musculoskeletal: No acute musculoskeletal process. Question of
permeative changes in the RIGHT proximal humerus about the humeral
head. Not evident on more remote radiographs.

Review of the MIP images confirms the above findings.
IMPRESSION: 1. No evidence of pulmonary embolism.
2. Slightly smaller size of bronchopleural fistula in the RIGHT
upper lobe amidst post treatment and consolidative changes in the
RIGHT chest.
3. Worsening of potential multifocal infection, correlate with any
history of [S8] infection given appearance
4. The appearance of multifocal pneumonia could also be related to a
large bronchopleural communication, RIGHT mainstem bronchus in
communication with cavitary focus in the RIGHT upper lobe and
dissemination subsequently of infected and or inflammatory material
throughout bronchi in the chest resulting in pneumonitis.
5. Question of permeative changes in the RIGHT proximal humerus
about the humeral head. Not evident on more remote radiographs.
Correlation with any symptoms in this area suggested. Subtle changes
seen at the margin of images from [facility]. Dedicated humeral
imaging may be helpful. Primary differential consideration given
relative chronicity would be aggressive osteopenia. Correlate with
any pain that might indicate infection. Aggressive osteopenia could
also be seen with limited use of the RIGHT upper extremity, imply
based on arm position.
6. Small to moderate pericardial effusion, similar to the prior
study.
7. Small pleural effusions and or mild pleural thickening with
slight increase bilaterally.
8. Aortic atherosclerosis.

Call is out to the referring provider to further discuss findings in
the above case.

Aortic Atherosclerosis ([S8]-[S8]).

## 2020-05-08 IMAGING — DX DG HUMERUS 2V *R*
2 series · 2 of 2 positions shown · non-contrast
Comparison: [DATE]

CLINICAL DATA: Right shoulder pain with limited mobility

EXAM:
RIGHT HUMERUS - 2+ VIEW

[humerus ap]
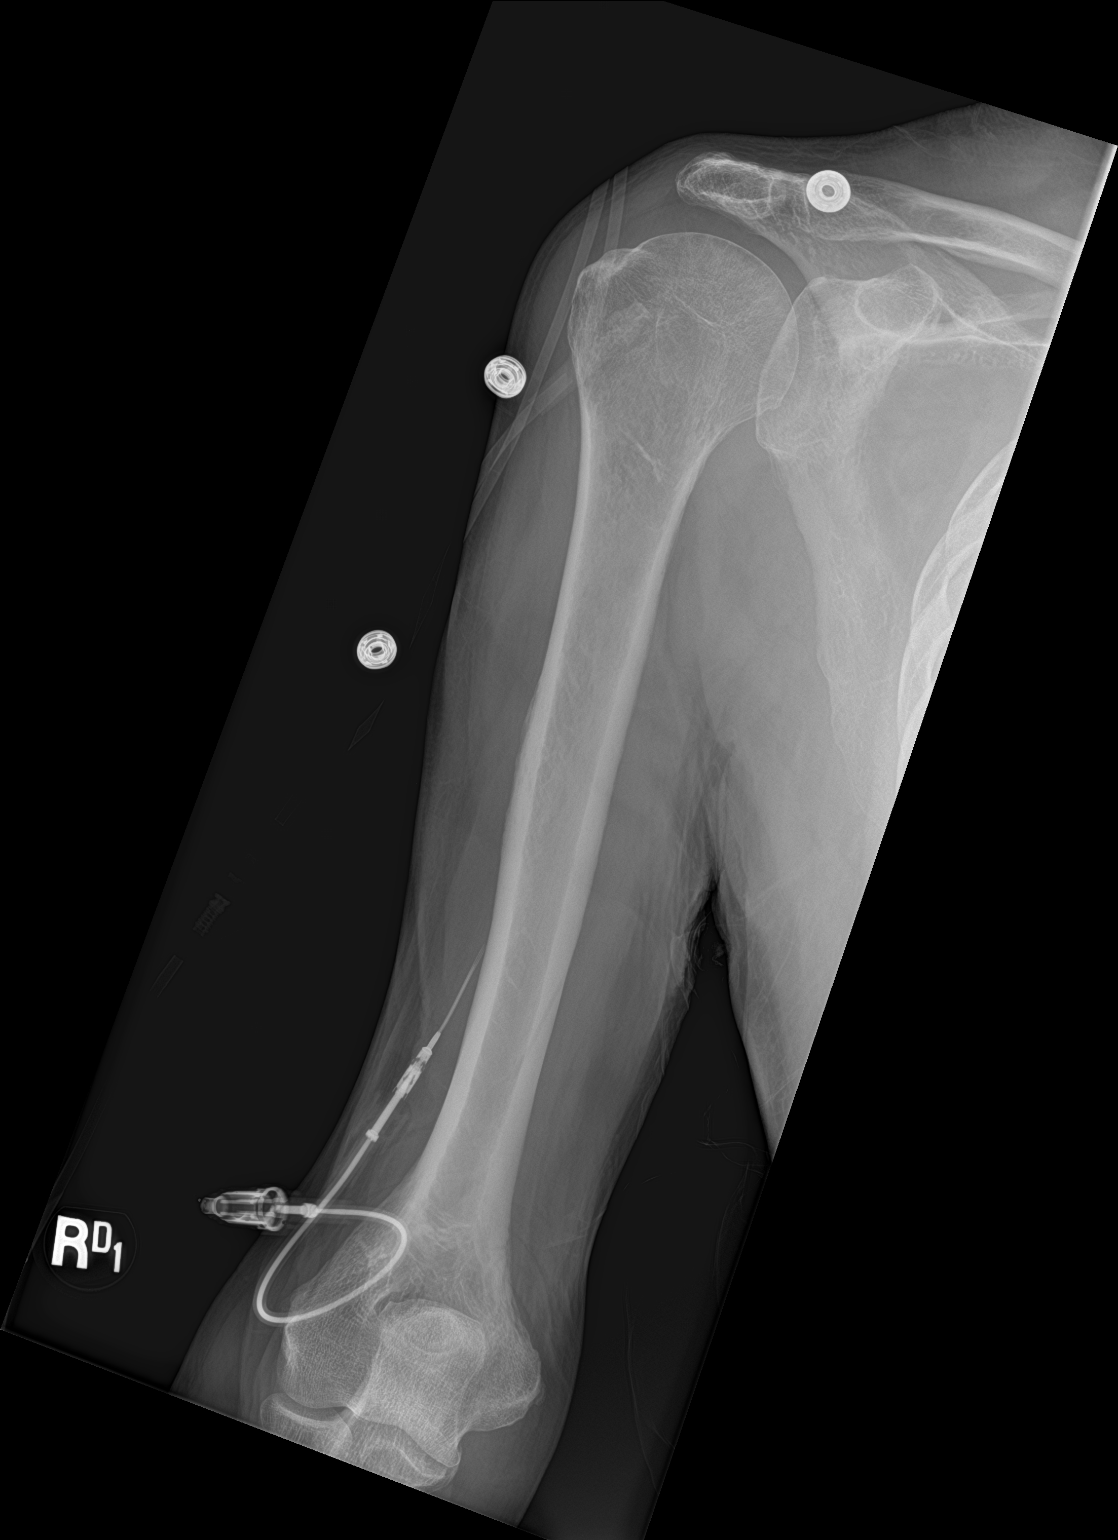

[humerus lat]
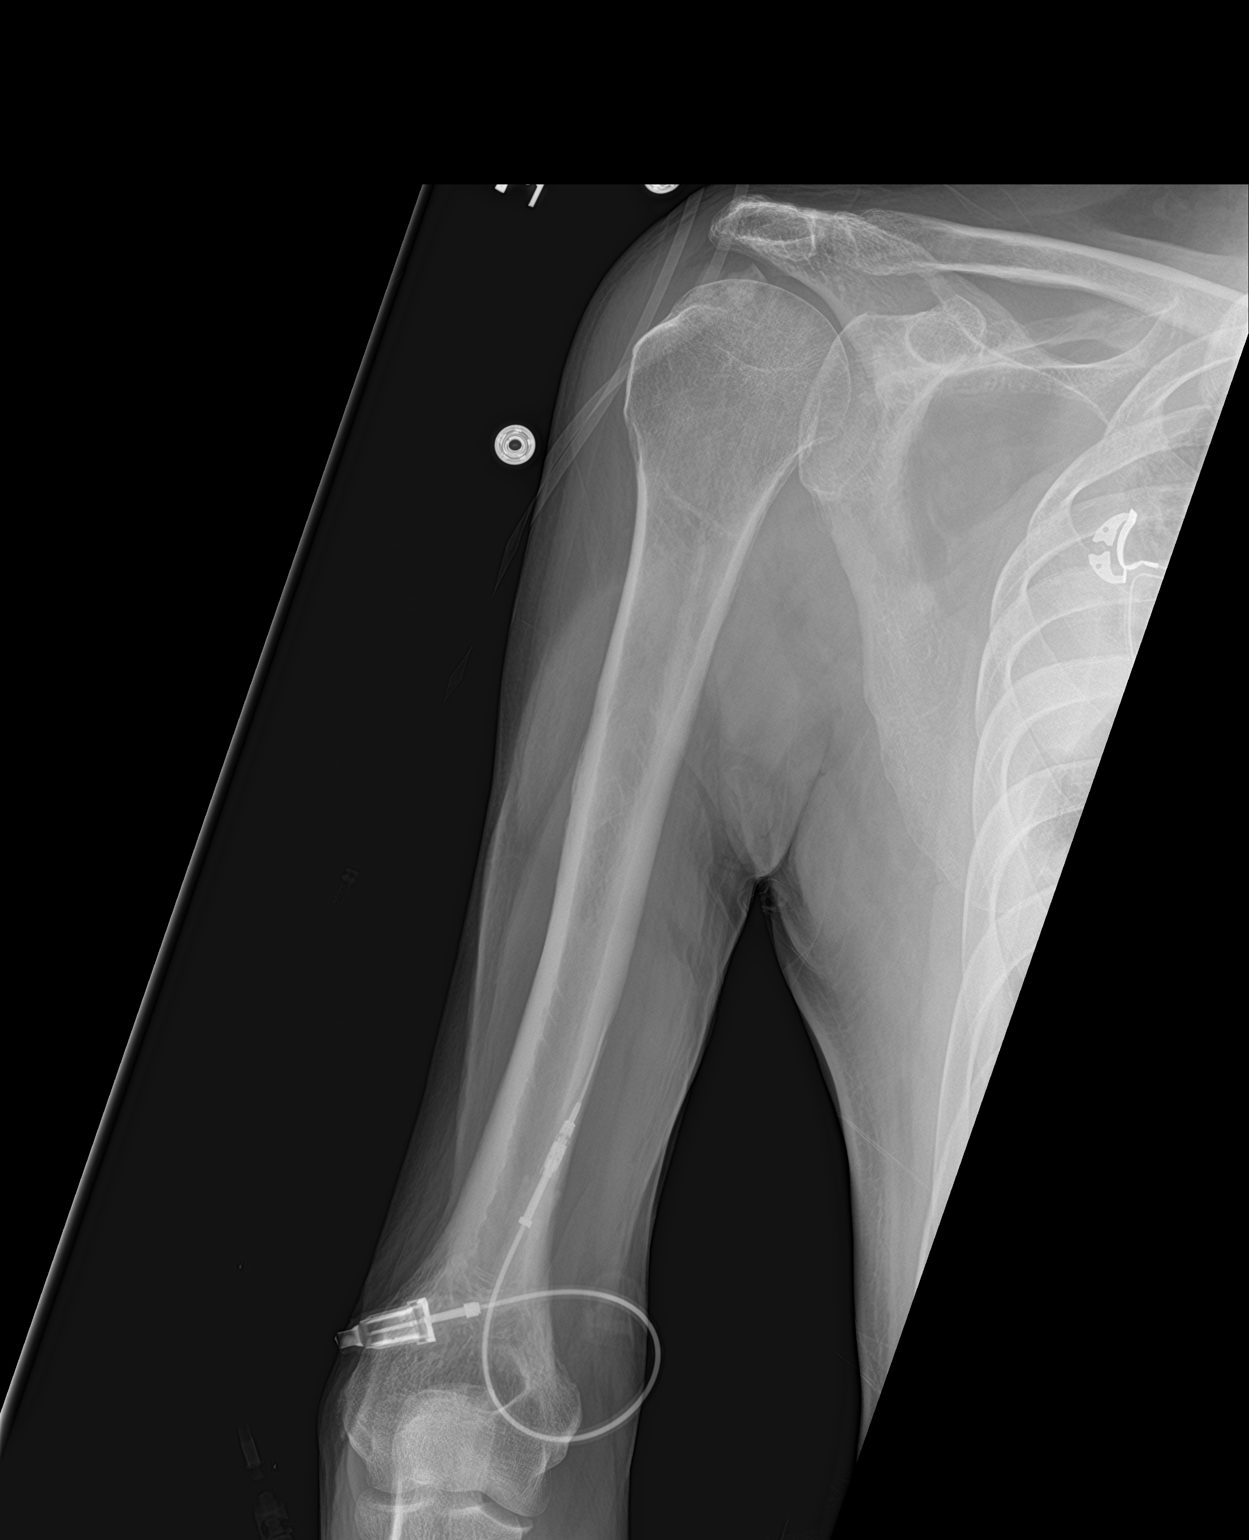

[2 of 2 positions shown; findings below may reference images not displayed]

FINDINGS: There is no evidence of fracture or other focal bone lesions. Soft
tissues are unremarkable.
IMPRESSION: Negative.

## 2020-05-08 MED ORDER — FERROUS SULFATE 325 (65 FE) MG PO TABS
325.0000 mg | ORAL_TABLET | Freq: Every day | ORAL | Status: DC
  Administered 2020-05-09 – 2020-05-11 (×3): 325 mg via ORAL
  Filled 2020-05-08 (×3): qty 1

## 2020-05-08 MED ORDER — PROCHLORPERAZINE MALEATE 10 MG PO TABS
10.0000 mg | ORAL_TABLET | Freq: Four times a day (QID) | ORAL | Status: DC | PRN
  Administered 2020-05-11: 10 mg via ORAL
  Filled 2020-05-08 (×3): qty 1

## 2020-05-08 MED ORDER — PREDNISONE 20 MG PO TABS: 10.0000 mg | ORAL_TABLET | Freq: Every day | ORAL | Status: DC

## 2020-05-08 MED ORDER — ACETAMINOPHEN 650 MG RE SUPP: 650.0000 mg | Freq: Four times a day (QID) | RECTAL | Status: DC | PRN

## 2020-05-08 MED ORDER — VITAMIN B-12 1000 MCG PO TABS
1000.0000 ug | ORAL_TABLET | Freq: Every day | ORAL | Status: DC
  Administered 2020-05-09 – 2020-05-11 (×3): 1000 ug via ORAL
  Filled 2020-05-08 (×3): qty 1

## 2020-05-08 MED ORDER — IOHEXOL 350 MG/ML SOLN
64.0000 mL | Freq: Once | INTRAVENOUS | Status: AC | PRN
  Administered 2020-05-08: 64 mL via INTRAVENOUS

## 2020-05-08 MED ORDER — DM-GUAIFENESIN ER 30-600 MG PO TB12
1.0000 | ORAL_TABLET | Freq: Two times a day (BID) | ORAL | Status: DC
  Administered 2020-05-08 – 2020-05-11 (×6): 1 via ORAL
  Filled 2020-05-08 (×7): qty 1

## 2020-05-08 MED ORDER — HYDROCORTISONE NA SUCCINATE PF 100 MG IJ SOLR
50.0000 mg | Freq: Four times a day (QID) | INTRAMUSCULAR | Status: DC
  Administered 2020-05-08 – 2020-05-09 (×4): 50 mg via INTRAVENOUS
  Filled 2020-05-08 (×4): qty 2

## 2020-05-08 MED ORDER — FENOFIBRATE 160 MG PO TABS
160.0000 mg | ORAL_TABLET | Freq: Every day | ORAL | Status: DC
  Administered 2020-05-09 – 2020-05-11 (×3): 160 mg via ORAL
  Filled 2020-05-08 (×4): qty 1

## 2020-05-08 MED ORDER — IPRATROPIUM-ALBUTEROL 20-100 MCG/ACT IN AERS
1.0000 | INHALATION_SPRAY | Freq: Four times a day (QID) | RESPIRATORY_TRACT | Status: DC | PRN
  Filled 2020-05-08: qty 4

## 2020-05-08 MED ORDER — LACTATED RINGERS IV BOLUS (SEPSIS)
1000.0000 mL | Freq: Once | INTRAVENOUS | Status: AC
  Administered 2020-05-08: 1000 mL via INTRAVENOUS

## 2020-05-08 MED ORDER — HYDROCORTISONE NA SUCCINATE PF 100 MG IJ SOLR
100.0000 mg | Freq: Once | INTRAMUSCULAR | Status: AC
  Administered 2020-05-08: 100 mg via INTRAVENOUS
  Filled 2020-05-08: qty 2

## 2020-05-08 MED ORDER — LACTATED RINGERS IV BOLUS (SEPSIS)
250.0000 mL | Freq: Once | INTRAVENOUS | Status: AC
  Administered 2020-05-08: 250 mL via INTRAVENOUS

## 2020-05-08 MED ORDER — ENOXAPARIN SODIUM 40 MG/0.4ML ~~LOC~~ SOLN
40.0000 mg | SUBCUTANEOUS | Status: DC
  Administered 2020-05-09 – 2020-05-10 (×2): 40 mg via SUBCUTANEOUS
  Filled 2020-05-08 (×3): qty 0.4

## 2020-05-08 MED ORDER — METRONIDAZOLE 500 MG PO TABS
500.0000 mg | ORAL_TABLET | Freq: Three times a day (TID) | ORAL | Status: DC
  Administered 2020-05-09 – 2020-05-11 (×8): 500 mg via ORAL
  Filled 2020-05-08 (×9): qty 1

## 2020-05-08 MED ORDER — SODIUM CHLORIDE 0.9 % IV SOLN
2.0000 g | Freq: Three times a day (TID) | INTRAVENOUS | Status: DC
  Administered 2020-05-08 – 2020-05-11 (×9): 2 g via INTRAVENOUS
  Filled 2020-05-08 (×9): qty 2

## 2020-05-08 MED ORDER — VANCOMYCIN HCL IN DEXTROSE 1-5 GM/200ML-% IV SOLN: 1000.0000 mg | Freq: Once | INTRAVENOUS | Status: DC

## 2020-05-08 MED ORDER — LORAZEPAM 1 MG PO TABS
1.0000 mg | ORAL_TABLET | Freq: Three times a day (TID) | ORAL | Status: DC | PRN
  Administered 2020-05-08 – 2020-05-11 (×4): 1 mg via ORAL
  Filled 2020-05-08 (×4): qty 1

## 2020-05-08 MED ORDER — LACTATED RINGERS IV SOLN: INTRAVENOUS | Status: AC

## 2020-05-08 MED ORDER — ACETAMINOPHEN 500 MG PO TABS
1000.0000 mg | ORAL_TABLET | Freq: Two times a day (BID) | ORAL | Status: DC
Start: 2020-05-08 — End: 2020-05-08

## 2020-05-08 MED ORDER — VANCOMYCIN HCL 1250 MG/250ML IV SOLN
1250.0000 mg | Freq: Two times a day (BID) | INTRAVENOUS | Status: DC
  Administered 2020-05-09 (×2): 1250 mg via INTRAVENOUS
  Filled 2020-05-08 (×3): qty 250

## 2020-05-08 MED ORDER — POTASSIUM CHLORIDE 10 MEQ/100ML IV SOLN: 10.0000 meq | INTRAVENOUS | Status: AC

## 2020-05-08 MED ORDER — ADULT MULTIVITAMIN W/MINERALS CH
1.0000 | ORAL_TABLET | Freq: Every day | ORAL | Status: DC
  Administered 2020-05-09 – 2020-05-11 (×3): 1 via ORAL
  Filled 2020-05-08 (×3): qty 1

## 2020-05-08 MED ORDER — ACETAMINOPHEN 325 MG PO TABS
650.0000 mg | ORAL_TABLET | Freq: Four times a day (QID) | ORAL | Status: DC | PRN
  Administered 2020-05-09 – 2020-05-11 (×5): 650 mg via ORAL
  Filled 2020-05-08 (×5): qty 2

## 2020-05-08 MED ORDER — SODIUM CHLORIDE 0.9 % IV SOLN
2.0000 g | Freq: Once | INTRAVENOUS | Status: AC
  Administered 2020-05-08: 2 g via INTRAVENOUS
  Filled 2020-05-08: qty 2

## 2020-05-08 MED ORDER — KETOROLAC TROMETHAMINE 15 MG/ML IJ SOLN: 15.0000 mg | Freq: Four times a day (QID) | INTRAMUSCULAR | Status: DC | PRN

## 2020-05-08 MED ORDER — VANCOMYCIN HCL 1500 MG/300ML IV SOLN
1500.0000 mg | Freq: Once | INTRAVENOUS | Status: AC
  Administered 2020-05-08: 1500 mg via INTRAVENOUS
  Filled 2020-05-08: qty 300

## 2020-05-08 MED ORDER — LIDOCAINE 5 % EX PTCH
1.0000 | MEDICATED_PATCH | CUTANEOUS | Status: DC
  Administered 2020-05-10: 1 via TRANSDERMAL
  Filled 2020-05-08 (×3): qty 1

## 2020-05-08 MED ORDER — FOLIC ACID 1 MG PO TABS
1.0000 mg | ORAL_TABLET | Freq: Every day | ORAL | Status: DC
  Administered 2020-05-09 – 2020-05-11 (×3): 1 mg via ORAL
  Filled 2020-05-08 (×3): qty 1

## 2020-05-08 MED ORDER — SODIUM CHLORIDE 0.9 % IV SOLN: INTRAVENOUS | Status: DC

## 2020-05-08 NOTE — H&P (Signed)
History and Physical    Richard Li RJJ:[id number] DOB: [birth year] DOA: 05/08/2020  PCP: [name], DO (Confirm with patient/family/NH records and if not entered, this has to be entered at [facility] point of entry) Patient coming from: Home  I have personally briefly reviewed patient's old medical records in [place]  Chief Complaint: Cough, SOB, chest pain  HPI: Richard Li is a 60 y.o. male with medical history significant of stage II non-small cell lung cancer status post radiation therapy, COPD, HTN, HLD, recurrent right upper lobe pneumonia, presented with increasing cough shortness of breath and chest pain.  Patient was treated for right upper lobe pneumonia since November 2021, it was last admitted in mid December and was discharged home with 7 days of Augmentin.  Patient reported that he continued to have productive cough with thick yellow and full smell sputum, but no fever.  He has been followed by pulmonology as outpatient who recently decided to extend his p.o. antibiotic treatment.  And patient since discharge has received 4 weeks of p.o. antibiotics +2 weeks of p.o. steroid 20 mg daily which both of them he has been still taking until today.  Last 2 days, he developed left-sided pleuritic chest pain, worsening with cough and chest movement, he decided any coughing up blood, no fever or chills.  But he does have exertional dyspnea which gradually getting worse. ED Course: Blood pressure on the lower side, was given 2.5 L IV boluses and chest x-ray showed concerning increasing right upper lobe infiltration for pneumonia.  Broad-spectrum antibiotic vancomycin plus cefepime started in ED.  CTA negative for PE, but showed persistent infiltrates consolidation on the right mid and upper lobe.  Lab work WBC 19.4, hemoglobin 8.0 compared to 9.0 last month.  Potassium 3.0.  Review of Systems: As per HPI otherwise 14 point review of systems negative.    Past Medical History:   Diagnosis Date  . Allergic rhinitis, cause unspecified   . Anxiety state, unspecified   . Asthma    as a child  . Chronic airway obstruction, not elsewhere classified   . Esophageal reflux   . History of kidney stones   . Hypertension   . Lumbago   . lung ca dx'd 12/2019  . Other and unspecified hyperlipidemia   . Other chest pain     Past Surgical History:  Procedure Laterality Date  . APPENDECTOMY    . BRONCHIAL BIOPSY  01/07/2020   Procedure: BRONCHIAL BIOPSIES;  Surgeon: Collene Gobble, MD;  Location[facility] ENDOSCOPY;  Service: Pulmonary;;  . BRONCHIAL BIOPSY  04/05/2020   Procedure: BRONCHIAL BIOPSIES;  Surgeon: Rigoberto Noel, MD;  Location: [place];  Service: Cardiopulmonary;;  . BRONCHIAL BRUSHINGS  01/07/2020   Procedure: BRONCHIAL BRUSHINGS;  Surgeon: Collene Gobble, MD;  Location: [facility] ENDOSCOPY;  Service: Pulmonary;;  . BRONCHIAL NEEDLE ASPIRATION BIOPSY  01/07/2020   Procedure: BRONCHIAL NEEDLE ASPIRATION BIOPSIES;  Surgeon: Collene Gobble, MD;  Location: [facility] ENDOSCOPY;  Service: Pulmonary;;  . BRONCHIAL WASHINGS  01/07/2020   Procedure: BRONCHIAL WASHINGS;  Surgeon: Collene Gobble, MD;  Location: [facility] ENDOSCOPY;  Service: Pulmonary;;  . BRONCHIAL WASHINGS  04/05/2020   Procedure: BRONCHIAL WASHINGS;  Surgeon: Rigoberto Noel, MD;  Location: [place] [facility] [place];  Service: Cardiopulmonary;;  . COLONOSCOPY  15 years ago  . HEMOSTASIS CONTROL  01/07/2020   Procedure: HEMOSTASIS CONTROL;  Surgeon: Collene Gobble, MD;  Location: [facility] ENDOSCOPY;  Service: Pulmonary;;  cold saline  . NASAL  TURBINATE REDUCTION  2002   Dr.Crossley  . VASECTOMY    . VIDEO BRONCHOSCOPY Right 04/05/2020   Procedure: VIDEO BRONCHOSCOPY WITH FLUORO;  Surgeon: Rigoberto Noel, MD;  Location: [place];  Service: Cardiopulmonary;  Laterality: Right;  [name] VIDEO BRONCHOSCOPY WITH ENDOBRONCHIAL NAVIGATION N/A 01/07/2020   Procedure: VIDEO BRONCHOSCOPY WITH ENDOBRONCHIAL NAVIGATION;  Surgeon: Collene Gobble, MD;   Location: [place] ENDOSCOPY;  Service: Pulmonary;  Laterality: N/A;  . VIDEO BRONCHOSCOPY WITH ENDOBRONCHIAL ULTRASOUND N/A 01/07/2020   Procedure: VIDEO BRONCHOSCOPY WITH ENDOBRONCHIAL ULTRASOUND;  Surgeon: Collene Gobble, MD;  Location: [place] ENDOSCOPY;  Service: Pulmonary;  Laterality: N/A;     reports that he quit smoking about 15 years ago. His smoking use included cigarettes. He has a 56.00 pack-year smoking history. He has never used smokeless tobacco. He reports current alcohol use of about 12.0 standard drinks of alcohol per week. He reports that he does not use drugs.  No Known Allergies  Family History  Problem Relation Age of Onset  . Breast cancer Mother   . Cancer Father   . Heart attack Brother   . Emphysema Maternal Uncle   . COPD Maternal Uncle   . Heart disease Maternal Uncle   . Colon cancer Neg Hx     Prior to Admission medications   Medication Sig Start Date End Date Taking? Authorizing Provider  acetaminophen (TYLENOL) 500 MG tablet Take 1,000 mg by mouth 2 (two) times daily.   Yes [provider]  amoxicillin-clavulanate (AUGMENTIN) 875-125 MG tablet Take 1 tablet by mouth 2 (two) times daily for 28 days. 05/01/20 05/29/20 Yes Hunsucker, Bonna Gains, MD  dextromethorphan-guaiFENesin [facility] DM) 30-600 MG 12hr tablet Take 1 tablet by mouth 2 (two) times daily.   Yes [provider]  fenofibrate 160 MG tablet Take 1 tablet (160 mg total) by mouth daily. 04/17/19  Yes [name], DO  folic acid (FOLVITE) 1 MG tablet Take 1 tablet (1 mg total) by mouth daily. 03/08/20  Yes Patrecia Pour, MD  Ipratropium-Albuterol (COMBIVENT RESPIMAT) 20-100 MCG/ACT AERS respimat Inhale 1-2 puffs into the lungs every 6 (six) hours as needed for wheezing. 01/22/20  Yes [name], DO  LORazepam (ATIVAN) 1 MG tablet TAKE 1 TABLET BY MOUTH THREE TIMES DAILY AS NEEDED FOR ANXIETY Patient taking differently: Take 1 mg by mouth See admin instructions. Take one tablet (1 mg) by  mouth daily at bedtime, may also take one tablet (1 mg) twice during the day as needed for anxiety 05/08/20  Yes [name], DO  Multiple Vitamin (MULTIVITAMIN WITH MINERALS) TABS tablet Take 1 tablet by mouth daily. 03/18/20  Yes Mercy Riding, MD  predniSONE (DELTASONE) 20 MG tablet Take 1 tablet (20 mg total) by mouth daily with breakfast for 14 days. 04/27/20 05/11/20 Yes Hunsucker, Bonna Gains, MD  prochlorperazine (COMPAZINE) 10 MG tablet TAKE 1 TABLET BY MOUTH EVERY 6 HOURS AS NEEDED FOR NAUSEA FOR VOMITING Patient taking differently: Take 10 mg by mouth every 6 (six) hours as needed for nausea or vomiting. 03/26/20  Yes Curt Bears, MD  vitamin B-12 1000 MCG tablet Take 1 tablet (1,000 mcg total) by mouth daily. 04/08/20 05/08/20 Yes Alma Friendly, MD    Physical Exam: Vitals:   05/08/20 1645 05/08/20 1700 05/08/20 1715 05/08/20 1745  BP: 117/64 99/62 110/64 93/67  Pulse: (!) 111 (!) 112 (!) 111 (!) 107  Resp: 19 18 (!) 21 17  Temp:      TempSrc:  SpO2: 94% 96% 96% 96%  Weight:      Height:        Constitutional: NAD, calm, comfortable Vitals:   05/08/20 1645 05/08/20 1700 05/08/20 1715 05/08/20 1745  BP: 117/64 99/62 110/64 93/67  Pulse: (!) 111 (!) 112 (!) 111 (!) 107  Resp: 19 18 (!) 21 17  Temp:      TempSrc:      SpO2: 94% 96% 96% 96%  Weight:      Height:       Eyes: PERRL, lids and conjunctivae normal ENMT: Mucous membranes are moist. Posterior pharynx clear of any exudate or lesions.Normal dentition.  Neck: normal, supple, no masses, no thyromegaly Respiratory: Coarse breathing sounds and diffuse crackle on the upper field on right side.  Increasing respiratory effort. No accessory muscle use.  Tenderness on palpation on the left rib cage Cardiovascular: Regular rate and rhythm, no murmurs / rubs / gallops. No extremity edema. 2+ pedal pulses. No carotid bruits.  Abdomen: no tenderness, no masses palpated. No hepatosplenomegaly. Bowel sounds positive.   Musculoskeletal: no clubbing / cyanosis. No joint deformity upper and lower extremities. Good ROM, no contractures. Normal muscle tone.  Skin: no rashes, lesions, ulcers. No induration Neurologic: CN 2-12 grossly intact. Sensation intact, DTR normal. Strength 5/5 in all 4.  Psychiatric: Normal judgment and insight. Alert and oriented x 3. Normal mood.     Labs on Admission: I have personally reviewed following labs and imaging studies  CBC: Recent Labs  Lab 05/08/20 1137  WBC 19.4*  HGB 8.0*  HCT 27.3*  MCV 88.6  PLT 010*   Basic Metabolic Panel: Recent Labs  Lab 05/08/20 1135  NA 136  K 3.0*  CL 98  CO2 23  GLUCOSE 111*  BUN 13  CREATININE 0.72  CALCIUM 9.4   GFR: Estimated Creatinine Clearance: 102.1 mL/min (by C-G formula based on SCr of 0.72 mg/dL). Liver Function Tests: Recent Labs  Lab 05/08/20 1135  AST 17  ALT 15  ALKPHOS 95  BILITOT 0.3  PROT 6.8  ALBUMIN 2.3*   No results for input(s): LIPASE, AMYLASE in the last 168 hours. No results for input(s): AMMONIA in the last 168 hours. Coagulation Profile: Recent Labs  Lab 05/08/20 1249  INR 1.2   Cardiac Enzymes: No results for input(s): CKTOTAL, CKMB, CKMBINDEX, TROPONINI in the last 168 hours. BNP (last 3 results) No results for input(s): PROBNP in the last 8760 hours. HbA1C: No results for input(s): HGBA1C in the last 72 hours. CBG: No results for input(s): GLUCAP in the last 168 hours. Lipid Profile: No results for input(s): CHOL, HDL, LDLCALC, TRIG, CHOLHDL, LDLDIRECT in the last 72 hours. Thyroid Function Tests: No results for input(s): TSH, T4TOTAL, FREET4, T3FREE, THYROIDAB in the last 72 hours. Anemia Panel: No results for input(s): VITAMINB12, FOLATE, FERRITIN, TIBC, IRON, RETICCTPCT in the last 72 hours. Urine analysis:    Component Value Date/Time   COLORURINE STRAW (A) 05/08/2020 1708   APPEARANCEUR CLEAR 05/08/2020 1708   LABSPEC 1.014 05/08/2020 1708   PHURINE 6.0  05/08/2020 1708   GLUCOSEU NEGATIVE 05/08/2020 1708   GLUCOSEU NEGATIVE 12/12/2011 0944   HGBUR NEGATIVE 05/08/2020 1708   BILIRUBINUR NEGATIVE 05/08/2020 1708   KETONESUR NEGATIVE 05/08/2020 1708   PROTEINUR NEGATIVE 05/08/2020 1708   UROBILINOGEN 0.2 12/12/2011 0944   NITRITE NEGATIVE 05/08/2020 1708   LEUKOCYTESUR NEGATIVE 05/08/2020 1708    Radiological Exams on Admission: DG Chest 2 View  Result Date: 05/08/2020 CLINICAL DATA:  Shortness  of breath. Trach cough. History of lung cancer. EXAM: CHEST - 2 VIEW COMPARISON:  04/17/2020, 04/05/2020 FINDINGS: Irregular cavitary right perihilar mass with adjacent airspace opacity. The degree of adjacent airspace opacity has slightly improved from prior. Improving aeration within the right lower lobe. Increasing patchy interstitial opacities within the left mid to lower lung fields. Small right pleural effusion. No pneumothorax. IMPRESSION: 1. Increasing patchy interstitial opacities within the left mid to lower lung fields, suspicious for multifocal infection. 2. Improving aeration of the right lower lobe. 3. Small right pleural effusion. 4. Persistent cavitary right perihilar mass with adjacent airspace opacity. Degree of airspace opacity has slightly improved. Electronically Signed   By: Davina Poke D.O.   On: 05/08/2020 11:23   CT Angio Chest PE W and/or Wo Contrast  Addendum Date: 05/08/2020   ADDENDUM REPORT: 05/08/2020 15:48 ADDENDUM: These results were called by telephone at the time of interpretation on 05/08/2020 at 3:47 pm to provider Vanetta Mulders, MD is, who verbally acknowledged these results. Electronically Signed   By: Zetta Bills M.D.   On: 05/08/2020 15:48   Result Date: 05/08/2020 CLINICAL DATA:  Shortness of breath EXAM: CT ANGIOGRAPHY CHEST WITH CONTRAST TECHNIQUE: Multidetector CT imaging of the chest was performed using the standard protocol during bolus administration of intravenous contrast. Multiplanar CT image  reconstructions and MIPs were obtained to evaluate the vascular anatomy. CONTRAST:  22mL OMNIPAQUE IOHEXOL 350 MG/ML SOLN COMPARISON:  April 17, 2020 FINDINGS: Cardiovascular: Normal caliber thoracic aorta. Normal heart size with small to moderate pericardial effusion that is similar to the prior study. Scattered mild aortic atherosclerosis. No sign of pulmonary embolism. Study quality is adequate though mildly limited by bolus timing Mediastinum/Nodes: No thoracic inlet, axillary or LEFT hilar adenopathy. Fullness of soft tissue about the LEFT infrahilar region and subcarinal region with similar appearance to recent imaging. Subcarinal nodal tissue approximately 1.5 cm short axis as compared to 1.6 cm on the prior study. Lungs/Pleura: Cavitary area in the RIGHT upper lobe extending to the RIGHT lung apex from the RIGHT hilum in communication with the RIGHT mainstem bronchus best seen on image 46 of series 7. Small amount of fluid dependently within this location. The cavity measuring 4.9 x 2.7 cm as compared to 5.1 x 3.7 cm. Consolidative changes in the RIGHT upper and RIGHT middle lobes with bronchiectatic changes with similar appearance. Soft tissue/consolidative process in the medial RIGHT lung base in the as ago esophageal recess measuring approximately 3.6 x 2.8 cm is stable compared to previous imaging. Since the prior study there is been worsening of ground-glass outside of areas of lung involved with post treatment changes with diffuse ground-glass nodularity throughout the LEFT chest with peripheral predominance and basilar predominance. Small pleural effusions and or mild pleural thickening with slight increase bilaterally. Upper Abdomen: Incidental imaging of upper abdominal contents without acute process. Musculoskeletal: No acute musculoskeletal process. Question of permeative changes in the RIGHT proximal humerus about the humeral head. Not evident on more remote radiographs. Review of the MIP  images confirms the above findings. IMPRESSION: 1. No evidence of pulmonary embolism. 2. 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 3. Worsening of potential multifocal infection, correlate with any history of COVID-19 infection given appearance 4. The appearance of multifocal pneumonia could also be related to a large bronchopleural communication, RIGHT mainstem bronchus in communication with cavitary focus in the RIGHT upper lobe and dissemination subsequently of  infected and or inflammatory material throughout bronchi in the chest resulting in pneumonitis. 5. Question of permeative changes in the RIGHT proximal humerus about the humeral head. Not evident on more remote radiographs. Correlation with any symptoms in this area suggested. Subtle changes seen at the margin of images from November. Dedicated humeral imaging may be helpful. Primary differential consideration given relative chronicity would be aggressive osteopenia. Correlate with any pain that might indicate infection. Aggressive osteopenia could also be seen with limited use of the RIGHT upper extremity, imply based on arm position. 6. Small to moderate pericardial effusion, similar to the prior study. 7. Small pleural effusions and or mild pleural thickening with slight increase bilaterally. 8. Aortic atherosclerosis. Call is out to the referring provider to further discuss findings in the above case. Aortic Atherosclerosis (ICD10-I70.0). Electronically Signed: By: Zetta Bills M.D. On: 05/08/2020 15:36   DG Humerus Right  Result Date: 05/08/2020 CLINICAL DATA:  Right shoulder pain with limited mobility EXAM: RIGHT HUMERUS - 2+ VIEW COMPARISON:  06/21/2019 FINDINGS: There is no evidence of fracture or other focal bone lesions. Soft tissues are unremarkable. IMPRESSION: Negative. Electronically Signed   By: Donavan Foil M.D.   On: 05/08/2020 16:17     EKG: Independently reviewed.  Chronic RBBB  Assessment/Plan Active Problems:   PNA (pneumonia)   Sepsis ([place])  (please populate well all problems here in Problem List. (For example, if patient is on BP meds at home and you resume or decide to hold them, it is a problem that needs to be her. Same for CAD, COPD, HLD and so on)  Recurrent right upper and middle lobe pneumonia -Suspect obstructive pneumonia/pneumonitis -Failed outpatient treatment. -Discussed with pulmonology, probably will need repeat bronchoscopy versus some kind of ablation treatment if it scar tissue from post radiation causing the obstruction. -Occurred with broad-spectrum antibiotics, will give also anatrophic coverage given foul smell sputum described the patient and his family.  MRSA screen sent and probably can D/C vancomycin if negative.  Sepsis -Secondary to right-sided pneumonia -Evidenced by hypotension, tachycardia and tachypnea, source is pneumonia -Stress dose steroid -Start maintenance IV fluid normal saline 125 mL/h x 12 hours  Hypokalemia -Replace and recheck  Pleural chest pain -From strenuous cough, ACS rule out. -Lidocaine patch and Toradol.  Stage II lung CA -Outpatient oncology follow-up  Chronic iron deficiency anemia -Iron studies done last admission, start iron supplement.  DVT prophylaxis: Code Status: Partial code, agreed with intubation but no CPR or CPR medications were defibrillator Family Communication: Wife at bedside Disposition Plan: Expect more than 2 midnight hospital stay consults called: Pulmonology admission status: PCU   Lequita Halt MD [facility] Pager [phone]  05/08/2020, 5:51 PM

## 2020-05-08 NOTE — Progress Notes (Signed)
Pharmacy Antibiotic Note  Richard Li is a 60 y.o. male admitted on 05/08/2020 with pneumonia.  Pharmacy has been consulted for vancomycin/cefepime dosing. SCr 0.72 on admit.  Plan: Cefepime 2g IV x 1; then 2g IV q8h Vancomycin 1500mg  IV x1; then Vancomycin 1250 mg IV Q 12 hrs. Goal AUC 400-550. Expected AUC: 537 SCr used: 0.8 Monitor clinical progress, c/s, renal function F/u de-escalation plan/LOT, vancomycin levels as indicated   Height: 5\' 10"  (177.8 cm) Weight: 72.6 kg (160 lb) IBW/kg (Calculated) : 73  Temp (24hrs), Avg:98.7 F (37.1 C), Min:98.7 F (37.1 C), Max:98.7 F (37.1 C)  No results for input(s): WBC, CREATININE, LATICACIDVEN, VANCOTROUGH, VANCOPEAK, VANCORANDOM, GENTTROUGH, GENTPEAK, GENTRANDOM, TOBRATROUGH, TOBRAPEAK, TOBRARND, AMIKACINPEAK, AMIKACINTROU, AMIKACIN in the last 168 hours.  Estimated Creatinine Clearance: 102.1 mL/min (by C-G formula based on SCr of 0.68 mg/dL).    No Known Allergies  Antimicrobials this admission: 1/14 vancomycin >>  1/14 cefepime >>   Dose adjustments this admission:   Microbiology results:   Arturo Morton, PharmD, BCPS Please check AMION for all [place] contact numbers Clinical Pharmacist 05/08/2020 11:17 AM

## 2020-05-08 NOTE — ED Notes (Signed)
Pt taken to xray 

## 2020-05-08 NOTE — Progress Notes (Signed)
[facility] following sepsis protocol.

## 2020-05-08 NOTE — Consult Note (Signed)
NAME:  Richard Li, MRN:  [id number], DOB:  Nov 17, 1960, LOS: 0 ADMISSION DATE:  05/08/2020, CONSULTATION DATE:  05/08/2020 REFERRING MD:  Dr. Gordy Levan, CHIEF COMPLAINT:  Recurrent PNA   Brief History:  59 yoM with recent dx of right lung adenocarcinoma stage IIB in 12/2019 s/p chemo and radiation presenting with pleuritic left chest pain, worsening SOB, and ongoing productive cough, no fever or chills, with CT chest showing worsening multifocal pneumonia despite recent multiple rounds of abx and steroids with negative bronch 04/05/2020. Pulmonary consulted for recurrent pneumonia.   History of Present Illness:   60 year old male recent diagnosis of adenocarcinoma of right lung in 12/2019, former smoker, COPD, and HTN presenting from home with worsening shortness of breath, ongoing productive green mucous sputum, and left sided chest pain, sharp in nature and worse with coughing/ movement.  Has additionally had poor PO intake at home.  Denies fever, chills, change in sputum production or color, no bleeding.    He has had four prior hospitalizations since November with multiple antibiotic and steroid courses without improvement in symptoms or radiographic improvement.   Previous SLP showed mild aspiration risk. S/p chemoradiation with radiation completed 11/30 and 4 cycles of carboplatin/paclitaxel on 11/15.  Had bronchoscopy during recent hospitalization on 04/05/2020 with negative workup. He is followed in our office, last seen on 12/20 by Dr. Silas Flood with concern for post-obstructive pneumonia vs lymphangitic spread and was given additional 14 days of augmentin without improvement followed by steroid course, with no improvement as well.     In ER, was afebrile and initially hypotensive with SBP in the 80's and tachycardia of 130's on ER arrival. Room air saturations 95%.  Labs noted for WBC 19.4, Hgb 8, Plts 703, LA 1.1, neg trop hs x 2, non acute EKG, K 3, albumin 2.3, COVID/ flu neg,  CXR showed  progressive multifocal pneumonia, small right pleural effusion, and persistent cavitary right perihilar mass with adjacent airspace opacity.  CTA PE was performed which was negative for PE but showed worsening multifocal infection and RUL bronchopleural fistula.  BP improved after fluid bolus and empirically started on stress dose steroids given multiple courses of steroids.  Cultured and started on empiric vancomycin and cefepime. He remains hemodynamically stable and on room air.  TRH to admit, Pulmonary consulted for recurrent pneumonia.   Past Medical History:  Former smoker, adenocarcinoma of right lung in 12/2019, COPD, and HTN  Significant Hospital Events:  1/14 admitted [facility]  Consults:  Pulmonary   Procedures:   Significant Diagnostic Tests:  05/08/2020 CTA PE >> 1. No evidence of pulmonary embolism. 2. 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 3. Worsening of potential multifocal infection, correlate with any history of COVID-19 infection given appearance 4. The appearance of multifocal pneumonia could also be related to a large bronchopleural communication, RIGHT mainstem bronchus in communication with cavitary focus in the RIGHT upper lobe and dissemination subsequently of infected and or inflammatory material throughout bronchi in the chest resulting in pneumonitis. 5. Question of permeative changes in the RIGHT proximal humerus about the humeral head. Not evident on more remote radiographs. Correlation with any symptoms in this area suggested. Subtle changes seen at the margin of images from November. Dedicated humeral imaging may be helpful. Primary differential consideration given relative chronicity would be aggressive osteopenia. Correlate with any pain that might indicate infection. Aggressive osteopenia could also be seen with  limited use of the RIGHT upper extremity,  imply based on arm position. 6. Small to moderate pericardial effusion, similar to the prior study. 7. Small pleural effusions and or mild pleural thickening with slight increase bilaterally. 8. Aortic atherosclerosis.  Micro Data:  1/14 SARS 2/ Flu >> 1/14 BCx 2 >> 1/14 UC >> 1/14 MRSA >>  Antimicrobials:  1/14 vanc >> 1/14 cefepime >>  Interim History / Subjective:  Cough, shortness of breath, chest discomfort  Objective   Blood pressure 105/63, pulse (!) 105, temperature 98.7 F (37.1 C), temperature source Oral, resp. rate 12, height 5\' 10"  (1.778 m), weight 72.6 kg, SpO2 96 %.        Intake/Output Summary (Last 24 hours) at 05/08/2020 1636 Last data filed at 05/08/2020 1629 Gross per 24 hour  Intake 2750 ml  Output --  Net 2750 ml   Filed Weights   05/08/20 1050  Weight: 72.6 kg    Examination: General: Middle-age, does not appear to be in distress HENT: Moist oral mucosa Lungs: Decreased air movement bilaterally Cardiovascular: S1-S2 appreciated Abdomen: Soft, bowel sounds appreciated Extremities: No clubbing  Resolved Hospital Problem list     Assessment & Plan:   Recurrent PNA ddx post-obstruction pna vs lymphangitic spread -Patient recently had a bronchoscopy that did not reveal any organisms -Bronchoscopic sampling only revealed histology specimen from the airway -Patient recently completed started on a course of Augmentin started on 05/01/2020 -Was recently on steroids for possible radiation pneumonitis-steroids was started 04/27/2020  Extensive changes in the lung is difficult to decipher whether this is just related to lymphangitic spread or pneumonic infiltrate -He is not having any fevers or chills -Leukocytosis may be related to steroids -Doubt if bronchoscopy will be helpful in the short-term may be helpful if the concern for lymphangitic spread is the overarching diagnosis  Will recommend to continue current antibiotic therapy-vancomycin  and cefepime -Send sputum for gram stain and cultures to see whether this will guide antibiotic therapy -If no fever, signs of ongoing infection in 72 hours -Antibiotics may be discontinued/de-escalated  Hypotension Possible AI component given steroid use  -he has been on 20 mg of prednisone daily -Started on hydrocortisone, this can be  discontinued and prednisone 10 mg daily from a.m. -Consider weaning this off as soon as possible -Unlikely that he will develop adrenal insufficiency with the duration of the current course of steroids however he has been on steroids multiple times recently  Possibility of radiation pneumonitis causing infiltrative process  CT scan with a large cavitary process in the right upper lobe with bronchiectatic changes , right upper lobe bronchopleural fistula  Pain management as needed   Sherrilyn Rist, MD [facility] Pager: [phone]

## 2020-05-08 NOTE — Progress Notes (Signed)
Notified bedside RN of need for initial LA to be drawn.  RN informed me she had been in an emergency and unable to draw earlier, but RN is en route now to draw and then give abx.

## 2020-05-08 NOTE — ED Notes (Signed)
Attempted to call report to the floor. 

## 2020-05-08 NOTE — ED Triage Notes (Signed)
Pt presents with Left side chest pain, dizziness and SOB  x1 day. HR 136 in triage

## 2020-05-08 NOTE — ED Provider Notes (Addendum)
[facility] EMERGENCY DEPARTMENT Provider Note   CSN: [id number] Arrival date & time: 05/08/20  1042     History Chief Complaint  Patient presents with  . Chest Pain    Richard Li is a 60 y.o. male.  The history is provided by the patient.  Chest Pain Pain location:  L chest Pain quality: aching   Pain radiates to:  Does not radiate Pain severity:  Mild Onset quality:  Gradual Timing:  Constant Progression:  Unchanged Context: at rest   Relieved by:  Nothing Worsened by:  Nothing Associated symptoms: no abdominal pain, no back pain, no cough, no fever, no palpitations, no shortness of breath and no vomiting   Risk factors comment:  Lung cancer s/p chemoradtion, on steroids, just finished antibiotics      Past Medical History:  Diagnosis Date  . Allergic rhinitis, cause unspecified   . Anxiety state, unspecified   . Asthma    as a child  . Chronic airway obstruction, not elsewhere classified   . Esophageal reflux   . History of kidney stones   . Hypertension   . Lumbago   . lung ca dx'd 12/2019  . Other and unspecified hyperlipidemia   . Other chest pain     Patient Active Problem List   Diagnosis Date Noted  . Recurrent pneumonia 05/03/2020  . Protein-calorie malnutrition, severe 04/07/2020  . Sepsis due to pneumonia ([place]) 04/04/2020  . Symptomatic anemia 03/20/2020  . Volume overload 03/20/2020  . Sepsis ([place]) 03/13/2020  . SOB (shortness of breath) 03/06/2020  . COPD with acute exacerbation ([place]) 03/06/2020  . AKI (acute kidney injury) ([place]) 03/06/2020  . Dehydration 03/05/2020  . Multifocal pneumonia 03/05/2020  . Encounter for antineoplastic chemotherapy 01/30/2020  . Sinus congestion 01/22/2020  . Adenocarcinoma of right lung, stage 2 ([place]) 01/17/2020  . Pulmonary nodules/lesions, multiple 01/07/2020  . 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 12/21/2019  . Thoracic aortic atherosclerosis ([place]) 12/21/2019  . Erectile  dysfunction 11/06/2019  . Fatigue 10/16/2019  . Insomnia 09/10/2019  . BPPV (benign paroxysmal positional vertigo) 08/03/2017  . Physical exam, annual 03/30/2015  . Essential hypertension 06/03/2014  . Kidney stone 12/25/2012  . GERD 01/27/2010  . Hyperlipidemia 12/11/2009  . GAD (generalized anxiety disorder) 11/17/2007  . Anxiety state 11/17/2007  . Allergic rhinitis 02/26/2007  . 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[place]) 02/26/2007  . ABNORMAL CHEST XRAY 02/26/2007    Past Surgical History:  Procedure Laterality Date  . APPENDECTOMY    . BRONCHIAL BIOPSY  01/07/2020   Procedure: BRONCHIAL BIOPSIES;  Surgeon: Collene Gobble, MD;  Location: [facility] ENDOSCOPY;  Service: Pulmonary;;  . BRONCHIAL BIOPSY  04/05/2020   Procedure: BRONCHIAL BIOPSIES;  Surgeon: Rigoberto Noel, MD;  Location: [place] [facility];  Service: Cardiopulmonary;;  . BRONCHIAL BRUSHINGS  01/07/2020   Procedure: BRONCHIAL BRUSHINGS;  Surgeon: Collene Gobble, MD;  Location: [facility] ENDOSCOPY;  Service: Pulmonary;;  . BRONCHIAL NEEDLE ASPIRATION BIOPSY  01/07/2020   Procedure: BRONCHIAL NEEDLE ASPIRATION BIOPSIES;  Surgeon: Collene Gobble, MD;  Location: [facility] ENDOSCOPY;  Service: Pulmonary;;  . BRONCHIAL WASHINGS  01/07/2020   Procedure: BRONCHIAL WASHINGS;  Surgeon: Collene Gobble, MD;  Location: [facility] ENDOSCOPY;  Service: Pulmonary;;  . BRONCHIAL WASHINGS  04/05/2020   Procedure: BRONCHIAL WASHINGS;  Surgeon: Rigoberto Noel, MD;  Location: [place];  Service: Cardiopulmonary;;  . COLONOSCOPY  15 years ago  . HEMOSTASIS CONTROL  01/07/2020   Procedure:  HEMOSTASIS CONTROL;  Surgeon: Collene Gobble, MD;  Location: [facility] ENDOSCOPY;  Service: Pulmonary;;  cold saline  . NASAL TURBINATE REDUCTION  2002   Dr.Crossley  . VASECTOMY    . VIDEO BRONCHOSCOPY Right 04/05/2020   Procedure: VIDEO BRONCHOSCOPY WITH FLUORO;  Surgeon: Rigoberto Noel, MD;  Location: [place];  Service: Cardiopulmonary;   Laterality: Right;  [name] VIDEO BRONCHOSCOPY WITH ENDOBRONCHIAL NAVIGATION N/A 01/07/2020   Procedure: VIDEO BRONCHOSCOPY WITH ENDOBRONCHIAL NAVIGATION;  Surgeon: Collene Gobble, MD;  Location: [place] ENDOSCOPY;  Service: Pulmonary;  Laterality: N/A;  . VIDEO BRONCHOSCOPY WITH ENDOBRONCHIAL ULTRASOUND N/A 01/07/2020   Procedure: VIDEO BRONCHOSCOPY WITH ENDOBRONCHIAL ULTRASOUND;  Surgeon: Collene Gobble, MD;  Location: [place] ENDOSCOPY;  Service: Pulmonary;  Laterality: N/A;       Family History  Problem Relation Age of Onset  . Breast cancer Mother   . Cancer Father   . Heart attack Brother   . Emphysema Maternal Uncle   . COPD Maternal Uncle   . Heart disease Maternal Uncle   . Colon cancer Neg Hx     Social History   Tobacco Use  . Smoking status: Former Smoker    Packs/day: 2.00    Years: 28.00    Pack years: 56.00    Types: Cigarettes    Quit date: 04/25/2005    Years since quitting: 15.0  . Smokeless tobacco: Never Used  Vaping Use  . Vaping Use: Never used  Substance Use Topics  . Alcohol use: Yes    Alcohol/week: 12.0 standard drinks    Types: 12 Cans of beer per week    Comment: social use  . Drug use: No    Home Medications Prior to Admission medications   Medication Sig Start Date End Date Taking? Authorizing Provider  acetaminophen (TYLENOL) 500 MG tablet Take 1,000 mg by mouth 2 (two) times daily.   Yes [provider]  amoxicillin-clavulanate (AUGMENTIN) 875-125 MG tablet Take 1 tablet by mouth 2 (two) times daily for 28 days. 05/01/20 05/29/20 Yes Hunsucker, Bonna Gains, MD  dextromethorphan-guaiFENesin [facility] DM) 30-600 MG 12hr tablet Take 1 tablet by mouth 2 (two) times daily.   Yes [provider]  fenofibrate 160 MG tablet Take 1 tablet (160 mg total) by mouth daily. 04/17/19  Yes [name], DO  folic acid (FOLVITE) 1 MG tablet Take 1 tablet (1 mg total) by mouth daily. 03/08/20  Yes Patrecia Pour, MD  Ipratropium-Albuterol (COMBIVENT RESPIMAT)  20-100 MCG/ACT AERS respimat Inhale 1-2 puffs into the lungs every 6 (six) hours as needed for wheezing. 01/22/20  Yes [name], DO  LORazepam (ATIVAN) 1 MG tablet TAKE 1 TABLET BY MOUTH THREE TIMES DAILY AS NEEDED FOR ANXIETY Patient taking differently: Take 1 mg by mouth See admin instructions. Take one tablet (1 mg) by mouth daily at bedtime, may also take one tablet (1 mg) twice during the day as needed for anxiety 05/08/20  Yes [name], DO  Multiple Vitamin (MULTIVITAMIN WITH MINERALS) TABS tablet Take 1 tablet by mouth daily. 03/18/20  Yes Mercy Riding, MD  predniSONE (DELTASONE) 20 MG tablet Take 1 tablet (20 mg total) by mouth daily with breakfast for 14 days. 04/27/20 05/11/20 Yes Hunsucker, Bonna Gains, MD  prochlorperazine (COMPAZINE) 10 MG tablet TAKE 1 TABLET BY MOUTH EVERY 6 HOURS AS NEEDED FOR NAUSEA FOR VOMITING Patient taking differently: Take 10 mg by mouth every 6 (six) hours as needed for nausea or vomiting. 03/26/20  Yes Curt Bears, MD  vitamin B-12 1000 MCG tablet Take 1 tablet (1,000 mcg total) by mouth daily. 04/08/20 05/08/20 Yes Alma Friendly, MD    Allergies    Patient has no known allergies.  Review of Systems   Review of Systems  Constitutional: Negative for chills and fever.  HENT: Negative for ear pain and sore throat.   Eyes: Negative for pain and visual disturbance.  Respiratory: Negative for cough and shortness of breath.   Cardiovascular: Positive for chest pain. Negative for palpitations.  Gastrointestinal: Negative for abdominal pain and vomiting.  Genitourinary: Negative for dysuria and hematuria.  Musculoskeletal: Negative for arthralgias and back pain.  Skin: Negative for color change and rash.  Neurological: Negative for seizures and syncope.  All other systems reviewed and are negative.   Physical Exam Updated Vital Signs  ED Triage Vitals  Enc Vitals Group     BP 05/08/20 1050 (!) 89/54     Pulse Rate 05/08/20 1050 (!) 134      Resp 05/08/20 1050 20     Temp 05/08/20 1050 98.7 F (37.1 C)     Temp Source 05/08/20 1050 Oral     SpO2 05/08/20 1050 95 %     Weight 05/08/20 1050 160 lb (72.6 kg)     Height 05/08/20 1050 5\' 10"  (1.778 m)     Head Circumference --      Peak Flow --      Pain Score 05/08/20 1052 8     Pain Loc --      Pain Edu? --      Excl. in [place]? --     Physical Exam Vitals and nursing note reviewed.  Constitutional:      Appearance: He is well-developed and well-nourished. He is cachectic.  HENT:     Head: Normocephalic and atraumatic.  Eyes:     Extraocular Movements: Extraocular movements intact.     Conjunctiva/sclera: Conjunctivae normal.     Pupils: Pupils are equal, round, and reactive to light.  Cardiovascular:     Rate and Rhythm: Normal rate and regular rhythm.     Pulses:          Radial pulses are 2+ on the right side and 2+ on the left side.     Heart sounds: No murmur heard.   Pulmonary:     Effort: Pulmonary effort is normal. Tachypnea present. No respiratory distress.     Breath sounds: Decreased breath sounds present.  Abdominal:     Palpations: Abdomen is soft.     Tenderness: There is no abdominal tenderness.  Musculoskeletal:        General: No edema.     Cervical back: Normal range of motion and neck supple.     Right lower leg: No edema.     Left lower leg: No edema.  Skin:    General: Skin is warm and dry.     Capillary Refill: Capillary refill takes less than 2 seconds.  Neurological:     General: No focal deficit present.     Mental Status: He is alert.  Psychiatric:        Mood and Affect: Mood and affect normal.     ED Results / Procedures / Treatments   Labs (all labs ordered are listed, but only abnormal results are displayed) Labs Reviewed  CBC - Abnormal; Notable for the following components:      Result Value   WBC 19.4 (*)    RBC 3.08 (*)  Hemoglobin 8.0 (*)    HCT 27.3 (*)    MCHC 29.3 (*)    RDW 16.2 (*)    Platelets 703  (*)    All other components within normal limits  COMPREHENSIVE METABOLIC PANEL - Abnormal; Notable for the following components:   Potassium 3.0 (*)    Glucose, Bld 111 (*)    Albumin 2.3 (*)    All other components within normal limits  RESP PANEL BY RT-PCR (RSV, FLU A&B, COVID)  RVPGX2  CULTURE, BLOOD (ROUTINE X 2)  CULTURE, BLOOD (ROUTINE X 2)  URINE CULTURE  LACTIC ACID, PLASMA  PROTIME-INR  APTT  LACTIC ACID, PLASMA  URINALYSIS, ROUTINE W REFLEX MICROSCOPIC  TROPONIN I (HIGH SENSITIVITY)  TROPONIN I (HIGH SENSITIVITY)    EKG EKG Interpretation  Date/Time:  Friday May 08 2020 10:47:44 EST Ventricular Rate:  136 PR Interval:    QRS Duration: 80 QT Interval:  362 QTC Calculation: 544 R Axis:   -162 Text Interpretation: Sinus tachycardia with short PR Right superior axis deviation Right ventricular hypertrophy Abnormal ECG Confirmed by Lennice Sites [phone]) on 05/08/2020 10:59:16 AM   Radiology DG Chest 2 View  Result Date: 05/08/2020 CLINICAL DATA:  Shortness of breath. Trach cough. History of lung cancer. EXAM: CHEST - 2 VIEW COMPARISON:  04/17/2020, 04/05/2020 FINDINGS: Irregular cavitary right perihilar mass with adjacent airspace opacity. The degree of adjacent airspace opacity has slightly improved from prior. Improving aeration within the right lower lobe. Increasing patchy interstitial opacities within the left mid to lower lung fields. Small right pleural effusion. No pneumothorax. IMPRESSION: 1. Increasing patchy interstitial opacities within the left mid to lower lung fields, suspicious for multifocal infection. 2. Improving aeration of the right lower lobe. 3. Small right pleural effusion. 4. Persistent cavitary right perihilar mass with adjacent airspace opacity. Degree of airspace opacity has slightly improved. Electronically Signed   By: Davina Poke D.O.   On: 05/08/2020 11:23   CT Angio Chest PE W and/or Wo Contrast  Result Date: 05/08/2020 CLINICAL  DATA:  Shortness of breath EXAM: CT ANGIOGRAPHY CHEST WITH CONTRAST TECHNIQUE: Multidetector CT imaging of the chest was performed using the standard protocol during bolus administration of intravenous contrast. Multiplanar CT image reconstructions and MIPs were obtained to evaluate the vascular anatomy. CONTRAST:  18mL OMNIPAQUE IOHEXOL 350 MG/ML SOLN COMPARISON:  April 17, 2020 FINDINGS: Cardiovascular: Normal caliber thoracic aorta. Normal heart size with small to moderate pericardial effusion that is similar to the prior study. Scattered mild aortic atherosclerosis. No sign of pulmonary embolism. Study quality is adequate though mildly limited by bolus timing Mediastinum/Nodes: No thoracic inlet, axillary or LEFT hilar adenopathy. Fullness of soft tissue about the LEFT infrahilar region and subcarinal region with similar appearance to recent imaging. Subcarinal nodal tissue approximately 1.5 cm short axis as compared to 1.6 cm on the prior study. Lungs/Pleura: Cavitary area in the RIGHT upper lobe extending to the RIGHT lung apex from the RIGHT hilum in communication with the RIGHT mainstem bronchus best seen on image 46 of series 7. Small amount of fluid dependently within this location. The cavity measuring 4.9 x 2.7 cm as compared to 5.1 x 3.7 cm. Consolidative changes in the RIGHT upper and RIGHT middle lobes with bronchiectatic changes with similar appearance. Soft tissue/consolidative process in the medial RIGHT lung base in the as ago esophageal recess measuring approximately 3.6 x 2.8 cm is stable compared to previous imaging. Since the prior study there is been worsening of ground-glass outside of  areas of lung involved with post treatment changes with diffuse ground-glass nodularity throughout the LEFT chest with peripheral predominance and basilar predominance. Small pleural effusions and or mild pleural thickening with slight increase bilaterally. Upper Abdomen: Incidental imaging of upper  abdominal contents without acute process. Musculoskeletal: No acute musculoskeletal process. Question of permeative changes in the RIGHT proximal humerus about the humeral head. Not evident on more remote radiographs. Review of the MIP images confirms the above findings. IMPRESSION: 1. No evidence of pulmonary embolism. 2. 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 3. Worsening of potential multifocal infection, correlate with any history of COVID-19 infection given appearance 4. The appearance of multifocal pneumonia could also be related to a large bronchopleural communication, RIGHT mainstem bronchus in communication with cavitary focus in the RIGHT upper lobe and dissemination subsequently of infected and or inflammatory material throughout bronchi in the chest resulting in pneumonitis. 5. Question of permeative changes in the RIGHT proximal humerus about the humeral head. Not evident on more remote radiographs. Correlation with any symptoms in this area suggested. Subtle changes seen at the margin of images from November. Dedicated humeral imaging may be helpful. Primary differential consideration given relative chronicity would be aggressive osteopenia. Correlate with any pain that might indicate infection. Aggressive osteopenia could also be seen with limited use of the RIGHT upper extremity, imply based on arm position. 6. Small to moderate pericardial effusion, similar to the prior study. 7. Small pleural effusions and or mild pleural thickening with slight increase bilaterally. 8. Aortic atherosclerosis. Call is out to the referring provider to further discuss findings in the above case. Aortic Atherosclerosis (ICD10-I70.0). Electronically Signed   By: Zetta Bills M.D.   On: 05/08/2020 15:36    Procedures .Critical Care Performed by: Lennice Sites, DO Authorized by: Lennice Sites, DO   Critical  care provider statement:    Critical care time (minutes):  40   Critical care was necessary to treat or prevent imminent or life-threatening deterioration of the following conditions:  Sepsis   Critical care was time spent personally by me on the following activities:  Blood draw for specimens, development of treatment plan with patient or surrogate, discussions with primary provider, evaluation of patient's response to treatment, examination of patient, obtaining history from patient or surrogate, ordering and performing treatments and interventions, ordering and review of laboratory studies, ordering and review of radiographic studies, pulse oximetry, re-evaluation of patient's condition and review of old charts   I assumed direction of critical care for this patient from another provider in my specialty: no     (including critical care time)  Medications Ordered in ED Medications  lactated ringers infusion (has no administration in time range)  potassium chloride 10 mEq in 100 mL IVPB (has no administration in time range)  ceFEPIme (MAXIPIME) 2 g in sodium chloride 0.9 % 100 mL IVPB (has no administration in time range)  vancomycin (VANCOREADY) IVPB 1250 mg/250 mL (has no administration in time range)  lactated ringers bolus 1,000 mL (1,000 mLs Intravenous New Bag/Given 05/08/20 1352)    And  lactated ringers bolus 1,000 mL (0 mLs Intravenous Stopped 05/08/20 1353)    And  lactated ringers bolus 250 mL (250 mLs Intravenous New Bag/Given 05/08/20 1356)  ceFEPIme (MAXIPIME) 2 g in sodium chloride 0.9 % 100 mL IVPB (2 g Intravenous New Bag/Given 05/08/20 1335)  hydrocortisone sodium succinate (SOLU-CORTEF) 100 MG injection 100 mg (  100 mg Intravenous Given 05/08/20 1332)  vancomycin (VANCOREADY) IVPB 1500 mg/300 mL (1,500 mg Intravenous New Bag/Given 05/08/20 1307)  iohexol (OMNIPAQUE) 350 MG/ML injection 64 mL (64 mLs Intravenous Contrast Given 05/08/20 1511)    ED Course  I have reviewed the  triage vital signs and the nursing notes.  Pertinent labs & imaging results that were available during my care of the patient were reviewed by me and considered in my medical decision making (see chart for details).    MDM Rules/Calculators/A&P                          Richard Li is a 60 year old male with history of lung cancer status post chemoradiation currently on steroids and just finished a course of antibiotics who presents to the ED with chest pain, shortness of breath, cough.  Patient hypotensive in the 80s and tachycardic in the 130s upon arrival.  Symptoms over the last several days.  Worsening cough and shortness of breath as well.  Did have a bronchoscopy last hospital stay that appeared did not grow any bacteria.  Negative for TB.  Patient not currently on any chemotherapy.  He has diminished breath sounds throughout, appears cachectic, mildly tachypneic.  EKG shows sinus tachycardia.  No ischemic process seen on EKG.  Will get troponin.  We will get a CT scan to evaluate for PE.  Chest x-ray shows persistent right-sided infiltrate with left-sided infiltrate as well.  Possibly multifocal pneumonia.  Will start broad-spectrum antibiotics again especially given the hypotension.  He did not have a fever.  Has not had fevers at home.  We will give stress dose steroid with IV Solu-Cortef given that he has been on steroids for the last several weeks.  He denies any GI symptoms but has had poor p.o. intake.  Could be dehydrated.  Anticipate admission and will pursue septic work-up.  He denies any bloody or dark stools.  Has needed transfusions in the past.  Suspect that he has some aspect of chronic/recurrent aspiration pneumonia versus radiation pneumonitis.  Lactic acid is normal. COVID test is normal. ET scan shows no PE. 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 He also has ongoing multifocal pneumonia with right mainstem bronchus cavitary focus in the right upper lobe and could be  pneumonitis. As stated previous pt had negative acid fast on bronch recently. May be some osteopenia changes to the right humeral head. Will get x-ray to further eval, low suspicion for infection.  Has had some chronic right arm pain but low concern for infectious process as no fever.  To be admitted for further care.  This chart was dictated using voice recognition software.  Despite best efforts to proofread,  errors can occur which can change the documentation meaning.   Final Clinical Impression(s) / ED Diagnoses Final diagnoses:  SOB (shortness of breath)  Hypotension, unspecified hypotension type  Multifocal pneumonia  Sepsis, due to unspecified organism, unspecified whether acute organ dysfunction present [facility])    Rx / [place] Orders ED Discharge Orders    None       Lennice Sites, DO 05/08/20 [place], [place], DO 05/08/20 1601

## 2020-05-09 DIAGNOSIS — A419 Sepsis, unspecified organism: Principal | ICD-10-CM

## 2020-05-09 LAB — BASIC METABOLIC PANEL
Anion gap: 11 (ref 5–15)
BUN: 14 mg/dL (ref 6–20)
CO2: 24 mmol/L (ref 22–32)
Calcium: 8.5 mg/dL — ABNORMAL LOW (ref 8.9–10.3)
Chloride: 102 mmol/L (ref 98–111)
Creatinine, Ser: 0.57 mg/dL — ABNORMAL LOW (ref 0.61–1.24)
GFR, Estimated: >60 mL/min (ref >60)
Glucose, Bld: 114 mg/dL — ABNORMAL HIGH (ref 70–99)
Potassium: 3.4 mmol/L — ABNORMAL LOW (ref 3.5–5.1)
Sodium: 137 mmol/L (ref 135–145)

## 2020-05-09 LAB — CBC
HCT: 24 % — ABNORMAL LOW (ref 39.0–52.0)
Hemoglobin: 7 g/dL — ABNORMAL LOW (ref 13.0–17.0)
MCH: 26.2 pg (ref 26.0–34.0)
MCHC: 29.2 g/dL — ABNORMAL LOW (ref 30.0–36.0)
MCV: 89.9 fL (ref 80.0–100.0)
Platelets: 522 10*3/uL — ABNORMAL HIGH (ref 150–400)
RBC: 2.67 MIL/uL — ABNORMAL LOW (ref 4.22–5.81)
RDW: 16.2 % — ABNORMAL HIGH (ref 11.5–15.5)
WBC: 15.2 10*3/uL — ABNORMAL HIGH (ref 4.0–10.5)
nRBC: 0 % (ref 0.0–0.2)

## 2020-05-09 LAB — PROCALCITONIN: Procalcitonin: 0.28 ng/mL

## 2020-05-09 LAB — MRSA PCR SCREENING: MRSA by PCR: NEGATIVE

## 2020-05-09 MED ORDER — ENSURE ENLIVE PO LIQD
237.0000 mL | Freq: Three times a day (TID) | ORAL | Status: DC
  Administered 2020-05-09 – 2020-05-11 (×5): 237 mL via ORAL

## 2020-05-09 MED ORDER — PREDNISONE 10 MG PO TABS
10.0000 mg | ORAL_TABLET | Freq: Every day | ORAL | Status: DC
  Administered 2020-05-10 – 2020-05-11 (×2): 10 mg via ORAL
  Filled 2020-05-09 (×2): qty 1

## 2020-05-09 NOTE — Progress Notes (Signed)
PROGRESS NOTE    RYE DECOSTE  SWN:[id number] DOB: [birth year] DOA: 05/08/2020 PCP: [name], DO   Brief Narrative:  HPI per Dr. Wynetta Fines on 05/08/20 NICKALOS PETERSEN is a 60 y.o. male with medical history significant of stage II non-small cell lung cancer status post radiation therapy, COPD, HTN, HLD, recurrent right upper lobe pneumonia, presented with increasing cough shortness of breath and chest pain.  Patient was treated for right upper lobe pneumonia since November 2021, it was last admitted in mid December and was discharged home with 7 days of Augmentin.  Patient reported that he continued to have productive cough with thick yellow and full smell sputum, but no fever.  He has been followed by pulmonology as outpatient who recently decided to extend his p.o. antibiotic treatment.  And patient since discharge has received 4 weeks of p.o. antibiotics +2 weeks of p.o. steroid 20 mg daily which both of them he has been still taking until today.  Last 2 days, he developed left-sided pleuritic chest pain, worsening with cough and chest movement, he decided any coughing up blood, no fever or chills.  But he does have exertional dyspnea which gradually getting worse. ED Course: Blood pressure on the lower side, was given 2.5 L IV boluses and chest x-ray showed concerning increasing right upper lobe infiltration for pneumonia.  Broad-spectrum antibiotic vancomycin plus cefepime started in ED.  CTA negative for PE, but showed persistent infiltrates consolidation on the right mid and upper lobe.  Lab work WBC 19.4, hemoglobin 8.0 compared to 9.0 last month.  Potassium 3.0.  **Interim History  Pulmonary recommending continuing Abx and holding off on Bronchoscopy for now. De-escalated Abx. Continue to Monitor and will repeat CXR in the AM.   Assessment & Plan:   Active Problems:   PNA (pneumonia)   Sepsis ([place])  Recurrent right upper and middle lobe pneumonia -Suspect obstructive  pneumonia/pneumonitis -Failed outpatient treatment with Augmentin started 05/01/2020 and he was also started on steroids for possible radiation pneumonitis history was restarted on 04/27/2020. -Discussed with pulmonology, probably will need repeat bronchoscopy versus some kind of ablation treatment if it scar tissue from post radiation causing the obstruction. -Pulmonary evaluated and he had a recent bronchoscopy about 3 to 4 weeks ago that did not reveal any organisms -At that time bronchoscopy sampling only revealed histology specimen from the airway. -Occurred with broad-spectrum antibiotics, will give also anaerobic coverage given foul smell sputum described the patient and his family.   -MRSA screen sent and probably can D/C vancomycin if negative.  Since it was negative now will discontinue vancomycin -Pulmonary feels that he has extensive changes in the lung and is difficult to decipher whether this is just related to his lymphangitic spread or pneumonic infiltrate -Currently he is not having fevers or chills and leukocytosis could be attributable to his steroid demargination -Pulmonary feels that repeating a bronchoscopy would not really be helpful in the short-term but may be helpful if there is concern for lymphangitic spread -Recommended continuing antibiotic course but we have de-escalate vancomycin given his negative MRSA -Pulmonary recommending sending sputum for gram stain culture received which will guide into by therapy  -He is started on hydrocortisone but this can be changed to prednisone 10 mg daily and consider weaning this soon as possible and his we have done this now -C/w Combivent, Mucinex DM  Sepsis -Secondary to right-sided pneumonia -Evidenced by hypotension, tachycardia and tachypnea, source is pneumonia; he did also have a leukocytosis but  this could be likely from steroids -Stress dose steroid given and this has not been stopped as- -Start maintenance IV fluid normal  saline 125 mL/h x 12 hours now stopped  -continue monitor-pulmonary consulted -Sepsis physiology is improving and WBC is trending down from 19.4 and is now 15.2 and he is no longer tachycardic or tachypneic -Calcitonin level is 0.23 and has slightly trended up to 0.28 -Continue to Monitor for S/Sx of Infection  Hypokalemia -Patient's potassium this morning was 3.4 -Replete with p.o. potassium chloride 40 mill course twice daily x2 doses We will continue to monitor and replete as necessary -Repeat CMP in the a.m.  Pleural chest pain -From strenuous cough, ACS rule out. -Lidocaine patch and Toradol.  Stage II lung CA -Outpatient oncology follow-up  Chronic iron deficiency anemia/anemia of chronic disease -Iron studies done last admission, start iron supplement and C/w Ferrous Sulfate 325 mg po Daily  -Patient's Hgb/Hct went from 8.0/27.3 -> 7.0/24.0 -Check Anemia Panel -Continue to Monitor S/Sx of Bleeding; No overt bleeding noted -Repeat CBC in the AM   DVT prophylaxis: Enopaxparin 40 mg sq 24h Code Status: FULL CODE  Family Communication: No family present at bedside  Disposition Plan: No family present at bedside   Status is: Inpatient  Remains inpatient appropriate because:Unsafe d/c plan, IV treatments appropriate due to intensity of illness or inability to take PO and Inpatient level of care appropriate due to severity of illness   Dispo: The patient is from: Home              Anticipated d/c is to: Home              Anticipated d/c date is: 2 days              Patient currently is not medically stable to d/c.  Consultants:   Pulmonary    Procedures: None  Antimicrobials:  Anti-infectives (From admission, onward)   Start     Dose/Rate Route Frequency Ordered Stop   05/09/20 0100  vancomycin (VANCOREADY) IVPB 1250 mg/250 mL        1,250 mg 166.7 mL/hr over 90 Minutes Intravenous Every 12 hours 05/08/20 1401     05/08/20 2130  ceFEPIme (MAXIPIME) 2 g in  sodium chloride 0.9 % 100 mL IVPB        2 g 200 mL/hr over 30 Minutes Intravenous Every 8 hours 05/08/20 1401     05/08/20 1815  metroNIDAZOLE (FLAGYL) tablet 500 mg        500 mg Oral Every 8 hours 05/08/20 1801     05/08/20 1130  vancomycin (VANCOREADY) IVPB 1500 mg/300 mL        1,500 mg 150 mL/hr over 120 Minutes Intravenous  Once 05/08/20 1118 05/08/20 1629   05/08/20 1115  vancomycin (VANCOCIN) IVPB 1000 mg/200 mL premix  Status:  Discontinued        1,000 mg 200 mL/hr over 60 Minutes Intravenous  Once 05/08/20 1104 05/08/20 1118   05/08/20 1115  ceFEPIme (MAXIPIME) 2 g in sodium chloride 0.9 % 100 mL IVPB        2 g 200 mL/hr over 30 Minutes Intravenous  Once 05/08/20 1104 05/08/20 1629        Subjective: Seen and examined at bedside and still coughing productive sputum. No Nausea or Vomiting. SOB is improving. Denied any other concerns or complaints at this time.   Objective: Vitals:   05/08/20 1945 05/08/20 2015 05/09/20 0351 05/09/20 0821  BP: 107/68  114/64 110/66 118/66  Pulse: (!) 112 100 94 98  Resp: 16 17 20 16   Temp:  97.7 F (36.5 C) 97.7 F (36.5 C) 98 F (36.7 C)  TempSrc:  Oral Oral Oral  SpO2: 96% 97% 96% 97%  Weight:  71.8 kg    Height:  5\' 10"  (1.778 m)      Intake/Output Summary (Last 24 hours) at 05/09/2020 0911 Last data filed at 05/08/2020 1629 Gross per 24 hour  Intake 2750 ml  Output --  Net 2750 ml   Filed Weights   05/08/20 1050 05/08/20 2015  Weight: 72.6 kg 71.8 kg   Examination: Physical Exam:  Constitutional: WN/WD Slightly ill-appearing Caucasian male in NAD and appears calm and comfortable Eyes: Lids and conjunctivae normal, sclerae anicteric  ENMT: External Ears, Nose appear normal. Grossly normal hearing.  Neck: Appears normal, supple, no cervical masses, normal ROM, no appreciable thyromegaly; no JVD Respiratory: Diminished to auscultation bilaterally, no wheezing, rales, rhonchi or crackles. Normal respiratory effort and  patient is not tachypenic. No accessory muscle use. Unlabored breathing  Cardiovascular: RRR, no murmurs / rubs / gallops. S1 and S2 auscultated. Trace edema Abdomen: Soft, non-tender, non-distended.  Bowel sounds positive.  GU: Deferred. Musculoskeletal: No clubbing / cyanosis of digits/nails. No joint deformity upper and lower extremities.  Skin: No rashes, lesions, ulcers on a limited skin evaluation. No induration; Warm and dry.  Neurologic: CN 2-12 grossly intact with no focal deficits. Romberg sign and cerebellar reflexes not assessed.  Psychiatric: Normal judgment and insight. Alert and oriented x 3. Normal mood and appropriate affect.   Data Reviewed: I have personally reviewed following labs and imaging studies  CBC: Recent Labs  Lab 05/08/20 1137 05/09/20 0125  WBC 19.4* 15.2*  HGB 8.0* 7.0*  HCT 27.3* 24.0*  MCV 88.6 89.9  PLT 703* 426*   Basic Metabolic Panel: Recent Labs  Lab 05/08/20 1135 05/09/20 0125  NA 136 137  K 3.0* 3.4*  CL 98 102  CO2 23 24  GLUCOSE 111* 114*  BUN 13 14  CREATININE 0.72 0.57*  CALCIUM 9.4 8.5*   GFR: Estimated Creatinine Clearance: 101.1 mL/min (A) (by C-G formula based on SCr of 0.57 mg/dL (L)). Liver Function Tests: Recent Labs  Lab 05/08/20 1135  AST 17  ALT 15  ALKPHOS 95  BILITOT 0.3  PROT 6.8  ALBUMIN 2.3*   No results for input(s): LIPASE, AMYLASE in the last 168 hours. No results for input(s): AMMONIA in the last 168 hours. Coagulation Profile: Recent Labs  Lab 05/08/20 1249  INR 1.2   Cardiac Enzymes: No results for input(s): CKTOTAL, CKMB, CKMBINDEX, TROPONINI in the last 168 hours. BNP (last 3 results) No results for input(s): PROBNP in the last 8760 hours. HbA1C: No results for input(s): HGBA1C in the last 72 hours. CBG: No results for input(s): GLUCAP in the last 168 hours. Lipid Profile: No results for input(s): CHOL, HDL, LDLCALC, TRIG, CHOLHDL, LDLDIRECT in the last 72 hours. Thyroid Function  Tests: No results for input(s): TSH, T4TOTAL, FREET4, T3FREE, THYROIDAB in the last 72 hours. Anemia Panel: No results for input(s): VITAMINB12, FOLATE, FERRITIN, TIBC, IRON, RETICCTPCT in the last 72 hours. Sepsis Labs: Recent Labs  Lab 05/08/20 1249 05/08/20 2033 05/09/20 0125  PROCALCITON  --  0.23 0.28  LATICACIDVEN 1.1 1.0  --     Recent Results (from the past 240 hour(s))  Resp panel by RT-PCR (RSV, Flu A&B, Covid) Nasopharyngeal Swab     Status: None  Collection Time: 05/08/20 12:52 PM   Specimen: Nasopharyngeal Swab; Nasopharyngeal(NP) swabs in vial transport medium  Result Value Ref Range Status   SARS Coronavirus 2 by RT PCR NEGATIVE NEGATIVE Final    Comment: (NOTE) SARS-CoV-2 target nucleic acids are NOT DETECTED.  The SARS-CoV-2 RNA is generally detectable in upper respiratory specimens during the acute phase of infection. The lowest concentration of SARS-CoV-2 viral copies this assay can detect is 138 copies/mL. A negative result does not preclude SARS-Cov-2 infection and should not be used as the sole basis for treatment or other patient management decisions. A negative result may occur with  improper specimen collection/handling, submission of specimen other than nasopharyngeal swab, presence of viral mutation(s) within the areas targeted by this assay, and inadequate number of viral copies(<138 copies/mL). A negative result must be combined with clinical observations, patient history, and epidemiological information. The expected result is Negative.  Fact Sheet for Patients:  EntrepreneurPulse.com.au  Fact Sheet for Healthcare Providers:  IncredibleEmployment.be  This test is no t yet approved or cleared by the [place] FDA and  has been authorized for detection and/or diagnosis of SARS-CoV-2 by FDA under an Emergency Use Authorization (EUA). This EUA will remain  in effect (meaning this test can be used) for the  duration of the COVID-19 declaration under Section 564(b)(1) of the Act, 21 U.S.C.section 360bbb-3(b)(1), unless the authorization is terminated  or revoked sooner.       Influenza A by PCR NEGATIVE NEGATIVE Final   Influenza B by PCR NEGATIVE NEGATIVE Final    Comment: (NOTE) The Xpert Xpress SARS-CoV-2/FLU/RSV plus assay is intended as an aid in the diagnosis of influenza from Nasopharyngeal swab specimens and should not be used as a sole basis for treatment. Nasal washings and aspirates are unacceptable for Xpert Xpress SARS-CoV-2/FLU/RSV testing.  Fact Sheet for Patients: EntrepreneurPulse.com.au  Fact Sheet for Healthcare Providers: IncredibleEmployment.be  This test is not yet approved or cleared by the [place] FDA and has been authorized for detection and/or diagnosis of SARS-CoV-2 by FDA under an Emergency Use Authorization (EUA). This EUA will remain in effect (meaning this test can be used) for the duration of the COVID-19 declaration under Section 564(b)(1) of the Act, 21 U.S.C. section 360bbb-3(b)(1), unless the authorization is terminated or revoked.     Resp Syncytial Virus by PCR NEGATIVE NEGATIVE Final    Comment: (NOTE) Fact Sheet for Patients: EntrepreneurPulse.com.au  Fact Sheet for Healthcare Providers: IncredibleEmployment.be  This test is not yet approved or cleared by the [place] FDA and has been authorized for detection and/or diagnosis of SARS-CoV-2 by FDA under an Emergency Use Authorization (EUA). This EUA will remain in effect (meaning this test can be used) for the duration of the COVID-19 declaration under Section 564(b)(1) of the Act, 21 U.S.C. section 360bbb-3(b)(1), unless the authorization is terminated or revoked.  Performed at [facility], [place] [address] [place], [place] [address]   MRSA PCR Screening     Status: None   Collection Time:  05/08/20  8:17 PM   Specimen: Nasal Mucosa; Nasopharyngeal  Result Value Ref Range Status   MRSA by PCR NEGATIVE NEGATIVE Final    Comment:        The GeneXpert MRSA Assay (FDA approved for NASAL specimens only), is one component of a comprehensive MRSA colonization surveillance program. It is not intended to diagnose MRSA infection nor to guide or monitor treatment for MRSA infections. Performed at [facility], [place]  [address], [place], [place] [address]     RN Pressure Injury Documentation:     Estimated body mass index is 22.73 kg/m as calculated from the following:   Height as of this encounter: 5\' 10"  (1.778 m).   Weight as of this encounter: 71.8 kg.  Malnutrition Type:   Malnutrition Characteristics:   Nutrition Interventions:    Radiology Studies: DG Chest 2 View  Result Date: 05/08/2020 CLINICAL DATA:  Shortness of breath. Trach cough. History of lung cancer. EXAM: CHEST - 2 VIEW COMPARISON:  04/17/2020, 04/05/2020 FINDINGS: Irregular cavitary right perihilar mass with adjacent airspace opacity. The degree of adjacent airspace opacity has slightly improved from prior. Improving aeration within the right lower lobe. Increasing patchy interstitial opacities within the left mid to lower lung fields. Small right pleural effusion. No pneumothorax. IMPRESSION: 1. Increasing patchy interstitial opacities within the left mid to lower lung fields, suspicious for multifocal infection. 2. Improving aeration of the right lower lobe. 3. Small right pleural effusion. 4. Persistent cavitary right perihilar mass with adjacent airspace opacity. Degree of airspace opacity has slightly improved. Electronically Signed   By: Davina Poke D.O.   On: 05/08/2020 11:23   CT Angio Chest PE W and/or Wo Contrast  Addendum Date: 05/08/2020   ADDENDUM REPORT: 05/08/2020 15:48 ADDENDUM: These results were called by telephone at the time of interpretation on 05/08/2020 at 3:47 pm to provider  Vanetta Mulders, MD is, who verbally acknowledged these results. Electronically Signed   By: Zetta Bills M.D.   On: 05/08/2020 15:48   Result Date: 05/08/2020 CLINICAL DATA:  Shortness of breath EXAM: CT ANGIOGRAPHY CHEST WITH CONTRAST TECHNIQUE: Multidetector CT imaging of the chest was performed using the standard protocol during bolus administration of intravenous contrast. Multiplanar CT image reconstructions and MIPs were obtained to evaluate the vascular anatomy. CONTRAST:  55mL OMNIPAQUE IOHEXOL 350 MG/ML SOLN COMPARISON:  April 17, 2020 FINDINGS: Cardiovascular: Normal caliber thoracic aorta. Normal heart size with small to moderate pericardial effusion that is similar to the prior study. Scattered mild aortic atherosclerosis. No sign of pulmonary embolism. Study quality is adequate though mildly limited by bolus timing Mediastinum/Nodes: No thoracic inlet, axillary or LEFT hilar adenopathy. Fullness of soft tissue about the LEFT infrahilar region and subcarinal region with similar appearance to recent imaging. Subcarinal nodal tissue approximately 1.5 cm short axis as compared to 1.6 cm on the prior study. Lungs/Pleura: Cavitary area in the RIGHT upper lobe extending to the RIGHT lung apex from the RIGHT hilum in communication with the RIGHT mainstem bronchus best seen on image 46 of series 7. Small amount of fluid dependently within this location. The cavity measuring 4.9 x 2.7 cm as compared to 5.1 x 3.7 cm. Consolidative changes in the RIGHT upper and RIGHT middle lobes with bronchiectatic changes with similar appearance. Soft tissue/consolidative process in the medial RIGHT lung base in the as ago esophageal recess measuring approximately 3.6 x 2.8 cm is stable compared to previous imaging. Since the prior study there is been worsening of ground-glass outside of areas of lung involved with post treatment changes with diffuse ground-glass nodularity throughout the LEFT chest with peripheral  predominance and basilar predominance. Small pleural effusions and or mild pleural thickening with slight increase bilaterally. Upper Abdomen: Incidental imaging of upper abdominal contents without acute process. Musculoskeletal: No acute musculoskeletal process. Question of permeative changes in the RIGHT proximal humerus about the humeral head. Not evident on more remote radiographs. Review of the MIP images confirms the above findings.  IMPRESSION: 1. No evidence of pulmonary embolism. 2. 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 3. Worsening of potential multifocal infection, correlate with any history of COVID-19 infection given appearance 4. The appearance of multifocal pneumonia could also be related to a large bronchopleural communication, RIGHT mainstem bronchus in communication with cavitary focus in the RIGHT upper lobe and dissemination subsequently of infected and or inflammatory material throughout bronchi in the chest resulting in pneumonitis. 5. Question of permeative changes in the RIGHT proximal humerus about the humeral head. Not evident on more remote radiographs. Correlation with any symptoms in this area suggested. Subtle changes seen at the margin of images from November. Dedicated humeral imaging may be helpful. Primary differential consideration given relative chronicity would be aggressive osteopenia. Correlate with any pain that might indicate infection. Aggressive osteopenia could also be seen with limited use of the RIGHT upper extremity, imply based on arm position. 6. Small to moderate pericardial effusion, similar to the prior study. 7. Small pleural effusions and or mild pleural thickening with slight increase bilaterally. 8. Aortic atherosclerosis. Call is out to the referring provider to further discuss findings in the above case. Aortic Atherosclerosis (ICD10-I70.0). Electronically  Signed: By: Zetta Bills M.D. On: 05/08/2020 15:36   DG Humerus Right  Result Date: 05/08/2020 CLINICAL DATA:  Right shoulder pain with limited mobility EXAM: RIGHT HUMERUS - 2+ VIEW COMPARISON:  06/21/2019 FINDINGS: There is no evidence of fracture or other focal bone lesions. Soft tissues are unremarkable. IMPRESSION: Negative. Electronically Signed   By: Donavan Foil M.D.   On: 05/08/2020 16:17   Scheduled Meds: . dextromethorphan-guaiFENesin  1 tablet Oral BID  . enoxaparin (LOVENOX) injection  40 mg Subcutaneous Q24H  . fenofibrate  160 mg Oral Daily  . ferrous sulfate  325 mg Oral Q breakfast  . folic acid  1 mg Oral Daily  . hydrocortisone sod succinate (SOLU-CORTEF) inj  50 mg Intravenous Q6H  . lidocaine  1 patch Transdermal Q24H  . metroNIDAZOLE  500 mg Oral Q8H  . multivitamin with minerals  1 tablet Oral Daily  . cyanocobalamin  1,000 mcg Oral Daily   Continuous Infusions: . ceFEPime (MAXIPIME) IV 2 g (05/09/20 0535)  . vancomycin 1,250 mg (05/09/20 0200)    LOS: 1 day   Kerney Elbe, DO [facility] PAGER is on [place]  If 7PM-7AM, please contact night-coverage [email or web address]

## 2020-05-09 NOTE — Progress Notes (Signed)
Initial Nutrition Assessment  DOCUMENTATION CODES:   Not applicable  INTERVENTION:   -Ensure Enlive po TID, each supplement provides 350 kcal and 20 grams of protein -MVI with minerals daily  NUTRITION DIAGNOSIS:   Increased nutrient needs related to chronic illness (COPD) as evidenced by estimated needs.  GOAL:   Patient will meet greater than or equal to 90% of their needs  MONITOR:   PO intake,Supplement acceptance,Labs,Skin,I & O's,Weight trends  REASON FOR ASSESSMENT:   Malnutrition Screening Tool    ASSESSMENT:   Richard Li is a 60 y.o. male with medical history significant of stage II non-small cell lung cancer status post radiation therapy, COPD, HTN, HLD, recurrent right upper lobe pneumonia, presented with increasing cough shortness of breath and chest pain.  Pt admitted with recurrent rt upper and middle lobe pneumonia and sepsis.   Reviewed I/O's: +2.8 L x 24 hours  Pt unavailable at time of attempted contact.   No meal completions documented at this time.   Reviewed wt hx; pt has experienced a 6.1% wt loss over the past month, which is significant for time frame.   Pt is at high risk for malnutrition, however, RD unable to identify at this time. Pt would greatly benefit from addition of oral nutrition supplements.   Medications reviewed and include ferrous sulfate, folic acid, solu-cortef, and vitamin B-12.  Labs reviewed.   Diet Order:   Diet Order            Diet Heart Room service appropriate? Yes; Fluid consistency: Thin  Diet effective now                 EDUCATION NEEDS:   No education needs have been identified at this time  Skin:  Skin Assessment: Reviewed RN Assessment  Last BM:  Unknown  Height:   Ht Readings from Last 1 Encounters:  05/08/20 5\' 10"  (1.778 m)    Weight:   Wt Readings from Last 1 Encounters:  05/08/20 71.8 kg    Ideal Body Weight:  75.5 kg  BMI:  Body mass index is 22.73 kg/m.  Estimated  Nutritional Needs:   Kcal:  2300-2500  Protein:  130-145 grams  Fluid:  > 2 L    Loistine Chance, RD, LDN, CDCES Registered Dietitian II Certified Diabetes Care and Education Specialist Please refer to [facility] for RD and/or RD on-call/weekend/after hours pager

## 2020-05-10 ENCOUNTER — Inpatient Hospital Stay (HOSPITAL_COMMUNITY): Payer: BC Managed Care – PPO

## 2020-05-10 DIAGNOSIS — D509 Iron deficiency anemia, unspecified: Secondary | ICD-10-CM

## 2020-05-10 DIAGNOSIS — D72829 Elevated white blood cell count, unspecified: Secondary | ICD-10-CM

## 2020-05-10 DIAGNOSIS — J189 Pneumonia, unspecified organism: Secondary | ICD-10-CM

## 2020-05-10 LAB — COMPREHENSIVE METABOLIC PANEL
ALT: 10 U/L (ref 0–44)
AST: 12 U/L — ABNORMAL LOW (ref 15–41)
Albumin: 1.9 g/dL — ABNORMAL LOW (ref 3.5–5.0)
Alkaline Phosphatase: 74 U/L (ref 38–126)
Anion gap: 8 (ref 5–15)
BUN: 14 mg/dL (ref 6–20)
CO2: 27 mmol/L (ref 22–32)
Calcium: 8.5 mg/dL — ABNORMAL LOW (ref 8.9–10.3)
Chloride: 103 mmol/L (ref 98–111)
Creatinine, Ser: 0.64 mg/dL (ref 0.61–1.24)
GFR, Estimated: >60 mL/min (ref >60)
Glucose, Bld: 136 mg/dL — ABNORMAL HIGH (ref 70–99)
Potassium: 3.1 mmol/L — ABNORMAL LOW (ref 3.5–5.1)
Sodium: 138 mmol/L (ref 135–145)
Total Bilirubin: 0.5 mg/dL (ref 0.3–1.2)
Total Protein: 5.5 g/dL — ABNORMAL LOW (ref 6.5–8.1)

## 2020-05-10 LAB — CBC WITH DIFFERENTIAL/PLATELET
Abs Immature Granulocytes: 0.13 10*3/uL — ABNORMAL HIGH (ref 0.00–0.07)
Basophils Absolute: 0 10*3/uL (ref 0.0–0.1)
Basophils Relative: 0 %
Eosinophils Absolute: 0 10*3/uL (ref 0.0–0.5)
Eosinophils Relative: 0 %
HCT: 21.7 % — ABNORMAL LOW (ref 39.0–52.0)
Hemoglobin: 6.8 g/dL — CL (ref 13.0–17.0)
Immature Granulocytes: 1 %
Lymphocytes Relative: 6 %
Lymphs Abs: 0.9 10*3/uL (ref 0.7–4.0)
MCH: 26.8 pg (ref 26.0–34.0)
MCHC: 31.3 g/dL (ref 30.0–36.0)
MCV: 85.4 fL (ref 80.0–100.0)
Monocytes Absolute: 1.1 10*3/uL — ABNORMAL HIGH (ref 0.1–1.0)
Monocytes Relative: 7 %
Neutro Abs: 13.3 10*3/uL — ABNORMAL HIGH (ref 1.7–7.7)
Neutrophils Relative %: 86 %
Platelets: 479 10*3/uL — ABNORMAL HIGH (ref 150–400)
RBC: 2.54 MIL/uL — ABNORMAL LOW (ref 4.22–5.81)
RDW: 16.1 % — ABNORMAL HIGH (ref 11.5–15.5)
WBC: 15.4 10*3/uL — ABNORMAL HIGH (ref 4.0–10.5)
nRBC: 0 % (ref 0.0–0.2)

## 2020-05-10 LAB — URINE CULTURE: Culture: NO GROWTH

## 2020-05-10 LAB — RETICULOCYTES
Immature Retic Fract: 32.3 % — ABNORMAL HIGH (ref 2.3–15.9)
RBC.: 2.52 MIL/uL — ABNORMAL LOW (ref 4.22–5.81)
Retic Count, Absolute: 80.1 10*3/uL (ref 19.0–186.0)
Retic Ct Pct: 3.2 % — ABNORMAL HIGH (ref 0.4–3.1)

## 2020-05-10 LAB — VITAMIN B12: Vitamin B-12: 440 pg/mL (ref 180–914)

## 2020-05-10 LAB — IRON AND TIBC
Iron: 34 ug/dL — ABNORMAL LOW (ref 45–182)
Saturation Ratios: 21 % (ref 17.9–39.5)
TIBC: 161 ug/dL — ABNORMAL LOW (ref 250–450)
UIBC: 127 ug/dL

## 2020-05-10 LAB — PHOSPHORUS: Phosphorus: 2.7 mg/dL (ref 2.5–4.6)

## 2020-05-10 LAB — MAGNESIUM: Magnesium: 1.5 mg/dL — ABNORMAL LOW (ref 1.7–2.4)

## 2020-05-10 LAB — PREPARE RBC (CROSSMATCH)

## 2020-05-10 LAB — FOLATE: Folate: 16.6 ng/mL (ref >5.9)

## 2020-05-10 LAB — PROCALCITONIN: Procalcitonin: 0.12 ng/mL

## 2020-05-10 LAB — FERRITIN: Ferritin: 828 ng/mL — ABNORMAL HIGH (ref 24–336)

## 2020-05-10 IMAGING — DX DG CHEST 1V PORT
1 series · 1 of 1 positions shown · non-contrast
Comparison: [DATE] chest radiograph.

CLINICAL DATA: Follow-up multifocal infection, history of right
lung cancer

EXAM:
PORTABLE CHEST 1 VIEW

[chest]
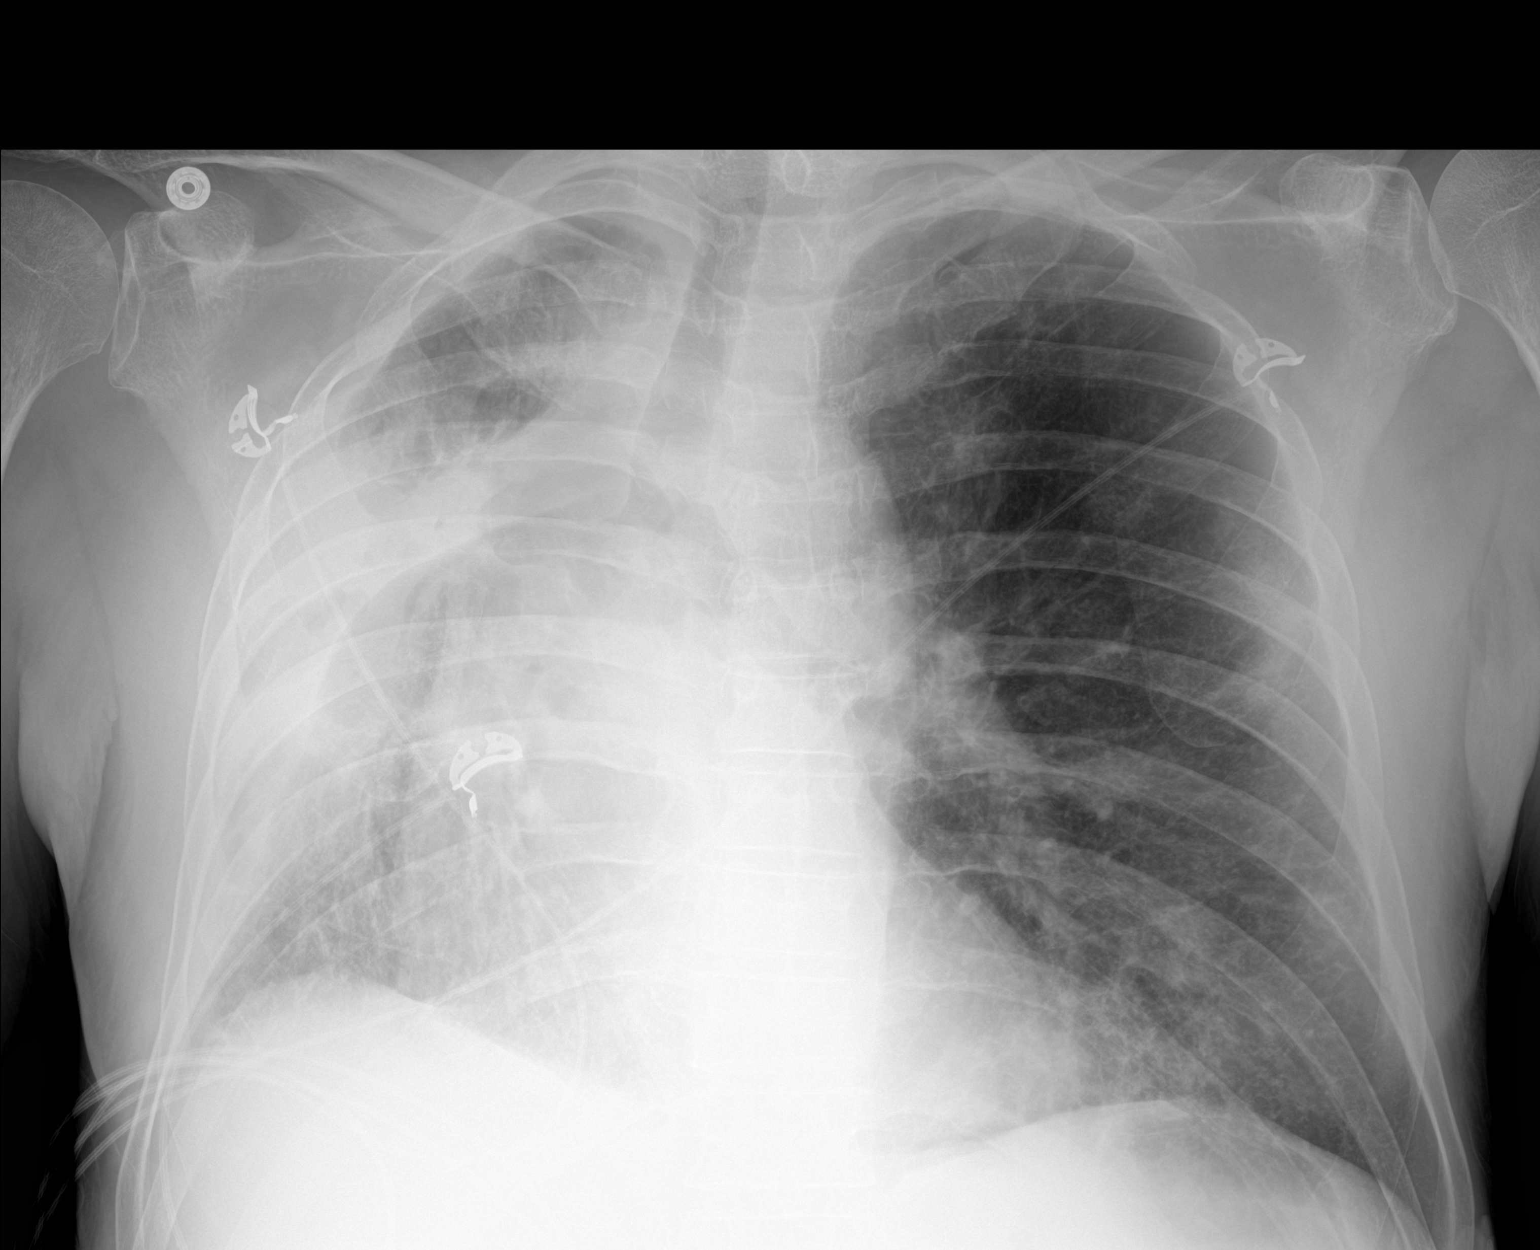

[1 of 1 positions shown; findings below may reference images not displayed]

FINDINGS: Stable cardiomediastinal silhouette with normal heart size. No
pneumothorax. Volume loss in the right hemithorax with dense patchy
right perihilar lung consolidation and distortion, mildly worsened.
Mild to moderate patchy opacities throughout the left lung, similar.
IMPRESSION: Stable mild to moderate patchy opacities throughout the left lung
compatible with multilobar pneumonia. Volume loss in the right
hemithorax and dense patchy right perihilar lung consolidation and
distortion, mildly worsened, favor predominantly evolving post
treatment change.

## 2020-05-10 MED ORDER — MAGNESIUM SULFATE 50 % IJ SOLN
3.0000 g | Freq: Once | INTRAVENOUS | Status: AC
  Administered 2020-05-10: 3 g via INTRAVENOUS
  Filled 2020-05-10: qty 6

## 2020-05-10 MED ORDER — SODIUM CHLORIDE 0.9% IV SOLUTION: Freq: Once | INTRAVENOUS | Status: DC

## 2020-05-10 MED ORDER — POTASSIUM CHLORIDE CRYS ER 20 MEQ PO TBCR
40.0000 meq | EXTENDED_RELEASE_TABLET | Freq: Two times a day (BID) | ORAL | Status: AC
  Administered 2020-05-10 (×2): 40 meq via ORAL
  Filled 2020-05-10 (×2): qty 2

## 2020-05-10 NOTE — Plan of Care (Signed)

## 2020-05-10 NOTE — Progress Notes (Signed)
PROGRESS NOTE    Richard Li  OIZ:[id number] DOB: 09/01/1960 DOA: 05/08/2020 PCP: [name], DO   Brief Narrative:  HPI per Dr. Wynetta Fines on 05/08/20 Richard Li is a 60 y.o. male with medical history significant of stage II non-small cell lung cancer status post radiation therapy, COPD, HTN, HLD, recurrent right upper lobe pneumonia, presented with increasing cough shortness of breath and chest pain.  Patient was treated for right upper lobe pneumonia since November 2021, it was last admitted in mid December and was discharged home with 7 days of Augmentin.  Patient reported that he continued to have productive cough with thick yellow and full smell sputum, but no fever.  He has been followed by pulmonology as outpatient who recently decided to extend his p.o. antibiotic treatment.  And patient since discharge has received 4 weeks of p.o. antibiotics +2 weeks of p.o. steroid 20 mg daily which both of them he has been still taking until today.  Last 2 days, he developed left-sided pleuritic chest pain, worsening with cough and chest movement, he decided any coughing up blood, no fever or chills.  But he does have exertional dyspnea which gradually getting worse. ED Course: Blood pressure on the lower side, was given 2.5 L IV boluses and chest x-ray showed concerning increasing right upper lobe infiltration for pneumonia.  Broad-spectrum antibiotic vancomycin plus cefepime started in ED.  CTA negative for PE, but showed persistent infiltrates consolidation on the right mid and upper lobe.  Lab work WBC 19.4, hemoglobin 8.0 compared to 9.0 last month.  Potassium 3.0.  **Interim History  Pulmonary recommending continuing Abx and holding off on Bronchoscopy for now. De-escalated Abx off of Vanc given MRSA PCR being negat. Continue to Monitor and  repeat CXR this AM showed "Stable mild to moderate patchy opacities throughout the left lung compatible with multilobar pneumonia. Volume loss in  the right hemithorax and dense patchy right perihilar lung consolidation and distortion, mildly worsened, favor predominantly evolving post treatment change."  Patient continues to cough significantly and his hemoglobin dropped slightly so we will type and screen and transfuse 1 unit of PRBCs.  Electrolytes are also abnormal so we will replete. Assessment & Plan:   Active Problems:   PNA (pneumonia)   Sepsis ([place])  Recurrent right upper and middle lobe pneumonia -Suspect obstructive pneumonia/pneumonitis -Failed outpatient treatment with Augmentin started 05/01/2020 and he was also started on steroids for possible radiation pneumonitis history was restarted on 04/27/2020. -Discussed with pulmonology, probably will need repeat bronchoscopy versus some kind of ablation treatment if it scar tissue from post radiation causing the obstruction. -Pulmonary evaluated and he had a recent bronchoscopy about 3 to 4 weeks ago that did not reveal any organisms -At that time bronchoscopy sampling only revealed histology specimen from the airway. -Occurred with broad-spectrum antibiotics, will give also anaerobic coverage given foul smell sputum described the patient and his family.   -MRSA screen sent and since it was negative will discontinue vancomycin -Pulmonary feels that he has extensive changes in the lung and is difficult to decipher whether this is just related to his lymphangitic spread or pneumonic infiltrate -Currently he is not having fevers or chills and leukocytosis could be attributable to his steroid demargination -Pulmonary feels that repeating a bronchoscopy would not really be helpful in the short-term but may be helpful if there is concern for lymphangitic spread -Recommended continuing antibiotic course but we have de-escalate vancomycin given his negative MRSA -Pulmonary recommending sending sputum  for gram stain culture received which will guide into by therapy  -He is started on  hydrocortisone but this can be changed to prednisone 10 mg daily and consider weaning this soon as possible and his we have done this now -Continues to cough up his sputum significantly -Repeat chest x-ray showed "Stable mild to moderate patchy opacities throughout the left lung compatible with multilobar pneumonia. Volume loss in the right hemithorax and dense patchy right perihilar lung consolidation and distortion, mildly worsened, favor predominantly evolving post treatment change." -C/w Combivent, Mucinex DM -Further care per pulmonary  Sepsis, improving slowly -Secondary to right-sided pneumonia -Evidenced by hypotension, tachycardia and tachypnea, source is pneumonia; he did also have a leukocytosis but this could be likely from steroids -Stress dose steroid given and this has not been stopped as- -Start maintenance IV fluid normal saline 125 mL/h x 12 hours now stopped  -continue monitor-pulmonary consulted -Sepsis physiology is improving and WBC is trending down from 19.4 and is now 15.2 yesterday and today it is 15.4 ALK remain elevated in the setting of steroid demargination given his hydrocortisone and he is no longer tachycardic or tachypneic -PRoCalcitonin level is 0.23 and has slightly trended up to 0.28 and is now trending back down to 0.12 -Continue to Monitor for S/Sx of Infection  Hypokalemia -Patient's potassium this morning was 3.1 -Replete with p.o. potassium chloride 40 mill course twice daily x2 doses -Continue to monitor and replete as necessary -Repeat CMP in the a.m.  Hypomagnesemia -Patient's magnesium level this morning was 1.5 -Replete with  IV mag sulfate 3 g -Continue to monitor and replete as necessary -Repeat magnesium level in the a.m.  Pleural chest pain -From strenuous cough, ACS rule out. -Lidocaine patch and Toradol.  Stage II lung CA -Outpatient oncology follow-up -May discuss with his oncologist in the morning  Acute on Chronic iron  deficiency anemia/anemia of chronic disease -Iron studies done last admission, start iron supplement and C/w Ferrous Sulfate 325 mg po Daily  -Patient's Hgb/Hct went from 8.0/27.3 -> 7.0/24.0 and now has further trended down to 6.8/21.7 -We will type and screen and transfuse 1 unit of PRBCs -Check Anemia Panel -Continue to Monitor S/Sx of Bleeding; No overt bleeding noted -Repeat CBC in the AM   DVT prophylaxis: Enopaxparin 40 mg sq 24h Code Status: FULL CODE  Family Communication: No family present at bedside  Disposition Plan: No family present at bedside   Status is: Inpatient  Remains inpatient appropriate because:Unsafe d/c plan, IV treatments appropriate due to intensity of illness or inability to take PO and Inpatient level of care appropriate due to severity of illness   Dispo: The patient is from: Home              Anticipated d/c is to: Home              Anticipated d/c date is: 2 days              Patient currently is not medically stable to d/c.  Consultants:   Pulmonary    Procedures: None  Antimicrobials:  Anti-infectives (From admission, onward)   Start     Dose/Rate Route Frequency Ordered Stop   05/09/20 0100  vancomycin (VANCOREADY) IVPB 1250 mg/250 mL  Status:  Discontinued        1,250 mg 166.7 mL/hr over 90 Minutes Intravenous Every 12 hours 05/08/20 1401 05/09/20 1805   05/08/20 2130  ceFEPIme (MAXIPIME) 2 g in sodium chloride 0.9 % 100 mL  IVPB        2 g 200 mL/hr over 30 Minutes Intravenous Every 8 hours 05/08/20 1401     05/08/20 1815  metroNIDAZOLE (FLAGYL) tablet 500 mg        500 mg Oral Every 8 hours 05/08/20 1801     05/08/20 1130  vancomycin (VANCOREADY) IVPB 1500 mg/300 mL        1,500 mg 150 mL/hr over 120 Minutes Intravenous  Once 05/08/20 1118 05/08/20 1629   05/08/20 1115  vancomycin (VANCOCIN) IVPB 1000 mg/200 mL premix  Status:  Discontinued        1,000 mg 200 mL/hr over 60 Minutes Intravenous  Once 05/08/20 1104 05/08/20 1118    05/08/20 1115  ceFEPIme (MAXIPIME) 2 g in sodium chloride 0.9 % 100 mL IVPB        2 g 200 mL/hr over 30 Minutes Intravenous  Once 05/08/20 1104 05/08/20 1629        Subjective: Seen and examined at bedside and he states that he still coughing up a significant amount of sputum.  Denies any nausea or vomiting.  No pain.  Does not feel as well today.  States his cough is his biggest issue he continues to have significant amount of sputum production.  No other concerns reported at this time.  Objective: Vitals:   05/09/20 1843 05/09/20 2200 05/10/20 0004 05/10/20 0315  BP: 120/66  114/67 124/66  Pulse: 83  73 72  Resp: _0 Temp: 97.6 F (36.4 C)  97.6 F (36.4 C) 98 F (36.7 C)  TempSrc: Oral  Oral Oral  SpO2: 100%  100% 100%  Weight:      Height:        Intake/Output Summary (Last 24 hours) at 05/10/2020 1001 Last data filed at 05/10/2020 0100 Gross per 24 hour  Intake 780 ml  Output --  Net 780 ml   Filed Weights   05/08/20 1050 05/08/20 2015  Weight: 72.6 kg 71.8 kg   Examination: Physical Exam:  Constitutional: WN/WD chronically ill-appearing Caucasian male currently in no acute distress appears calm and mildly uncomfortable Eyes: Lids and conjunctivae normal, sclerae anicteric  ENMT: External Ears, Nose appear normal. Grossly normal hearing.  Neck: Appears normal, supple, no cervical masses, normal ROM, no appreciable thyromegaly; no JVD Respiratory: Diminished to auscultation bilaterally with coarse breath sounds, no wheezing, rales, rhonchi or crackles. Normal respiratory effort and patient is not tachypenic. No accessory muscle use.  Unlabored breathing Cardiovascular: RRR, no murmurs / rubs / gallops. S1 and S2 auscultated.  Will extremity edema noted Abdomen: Soft, non-tender, non-distended. Bowel sounds positive.  GU: Deferred. Musculoskeletal: No clubbing / cyanosis of digits/nails. No joint deformity upper and lower extremities. Skin: No rashes,  lesions, ulcers on limited skin evaluation. No induration; Warm and dry.  Neurologic: CN 2-12 grossly intact with no focal deficits. Romberg sign and cerebellar reflexes not assessed.  Psychiatric: Normal judgment and insight. Alert and oriented x 3. Normal mood and appropriate affect.   Data Reviewed: I have personally reviewed following labs and imaging studies  CBC: Recent Labs  Lab 05/08/20 1137 05/09/20 0125 05/10/20 0124  WBC 19.4* 15.2* 15.4*  NEUTROABS  --   --  13.3*  HGB 8.0* 7.0* 6.8*  HCT 27.3* 24.0* 21.7*  MCV 88.6 89.9 85.4  PLT 703* 522* 161*   Basic Metabolic Panel: Recent Labs  Lab 05/08/20 1135 05/09/20 0125 05/10/20 0124  NA 136 137 138  K 3.0* 3.4* 3.1*  CL 98 102 103  CO2 _0 GLUCOSE 111* 114* 136*  BUN _1 CREATININE 0.72 0.57* 0.64  CALCIUM 9.4 8.5* 8.5*  MG  --   --  1.5*  PHOS  --   --  2.7   GFR: Estimated Creatinine Clearance: 101.1 mL/min (by C-G formula based on SCr of 0.64 mg/dL). Liver Function Tests: Recent Labs  Lab 05/08/20 1135 05/10/20 0124  AST 17 12*  ALT 15 10  ALKPHOS 95 74  BILITOT 0.3 0.5  PROT 6.8 5.5*  ALBUMIN 2.3* 1.9*   No results for input(s): LIPASE, AMYLASE in the last 168 hours. No results for input(s): AMMONIA in the last 168 hours. Coagulation Profile: Recent Labs  Lab 05/08/20 1249  INR 1.2   Cardiac Enzymes: No results for input(s): CKTOTAL, CKMB, CKMBINDEX, TROPONINI in the last 168 hours. BNP (last 3 results) No results for input(s): PROBNP in the last 8760 hours. HbA1C: No results for input(s): HGBA1C in the last 72 hours. CBG: No results for input(s): GLUCAP in the last 168 hours. Lipid Profile: No results for input(s): CHOL, HDL, LDLCALC, TRIG, CHOLHDL, LDLDIRECT in the last 72 hours. Thyroid Function Tests: No results for input(s): TSH, T4TOTAL, FREET4, T3FREE, THYROIDAB in the last 72 hours. Anemia Panel: No results for input(s): VITAMINB12, FOLATE, FERRITIN, TIBC, IRON,  RETICCTPCT in the last 72 hours. Sepsis Labs: Recent Labs  Lab 05/08/20 1249 05/08/20 2033 05/09/20 0125 05/10/20 0124  PROCALCITON  --  0.23 0.28 0.12  LATICACIDVEN 1.1 1.0  --   --     Recent Results (from the past 240 hour(s))  Urine culture     Status: None   Collection Time: 05/08/20 11:04 AM   Specimen: In/Out Cath Urine  Result Value Ref Range Status   Specimen Description IN/OUT CATH URINE  Final   Special Requests NONE  Final   Culture   Final    NO GROWTH Performed at [facility], [place] [address] [place], [place] [address]    Report Status 05/10/2020 FINAL  Final  Blood Culture (routine x 2)     Status: None (Preliminary result)   Collection Time: 05/08/20 12:48 PM   Specimen: BLOOD  Result Value Ref Range Status   Specimen Description BLOOD RIGHT ANTECUBITAL  Final   Special Requests   Final    BOTTLES DRAWN AEROBIC AND ANAEROBIC Blood Culture results may not be optimal due to an excessive volume of blood received in culture bottles   Culture   Final    NO GROWTH 2 DAYS Performed at [facility], [place] [address] [place],  [address]    Report Status PENDING  Incomplete  Resp panel by RT-PCR (RSV, Flu A&B, Covid) Nasopharyngeal Swab     Status: None   Collection Time: 05/08/20 12:52 PM   Specimen: Nasopharyngeal Swab; Nasopharyngeal(NP) swabs in vial transport medium  Result Value Ref Range Status   SARS Coronavirus 2 by RT PCR NEGATIVE NEGATIVE Final    Comment: (NOTE) SARS-CoV-2 target nucleic acids are NOT DETECTED.  The SARS-CoV-2 RNA is generally detectable in upper respiratory specimens during the acute phase of infection. The lowest concentration of SARS-CoV-2 viral copies this assay can detect is 138 copies/mL. A negative result does not preclude SARS-Cov-2 infection and should not be used as the sole basis for treatment or other patient management decisions. A negative result may occur with  improper specimen collection/handling,  submission of specimen other than nasopharyngeal  swab, presence of viral mutation(s) within the areas targeted by this assay, and inadequate number of viral copies(<138 copies/mL). A negative result must be combined with clinical observations, patient history, and epidemiological information. The expected result is Negative.  Fact Sheet for Patients:  EntrepreneurPulse.com.au  Fact Sheet for Healthcare Providers:  IncredibleEmployment.be  This test is no t yet approved or cleared by the [place] FDA and  has been authorized for detection and/or diagnosis of SARS-CoV-2 by FDA under an Emergency Use Authorization (EUA). This EUA will remain  in effect (meaning this test can be used) for the duration of the COVID-19 declaration under Section 564(b)(1) of the Act, 21 U.S.C.section 360bbb-3(b)(1), unless the authorization is terminated  or revoked sooner.       Influenza A by PCR NEGATIVE NEGATIVE Final   Influenza B by PCR NEGATIVE NEGATIVE Final    Comment: (NOTE) The Xpert Xpress SARS-CoV-2/FLU/RSV plus assay is intended as an aid in the diagnosis of influenza from Nasopharyngeal swab specimens and should not be used as a sole basis for treatment. Nasal washings and aspirates are unacceptable for Xpert Xpress SARS-CoV-2/FLU/RSV testing.  Fact Sheet for Patients: EntrepreneurPulse.com.au  Fact Sheet for Healthcare Providers: IncredibleEmployment.be  This test is not yet approved or cleared by the [place] FDA and has been authorized for detection and/or diagnosis of SARS-CoV-2 by FDA under an Emergency Use Authorization (EUA). This EUA will remain in effect (meaning this test can be used) for the duration of the COVID-19 declaration under Section 564(b)(1) of the Act, 21 U.S.C. section 360bbb-3(b)(1), unless the authorization is terminated or revoked.     Resp Syncytial Virus by PCR NEGATIVE  NEGATIVE Final    Comment: (NOTE) Fact Sheet for Patients: EntrepreneurPulse.com.au  Fact Sheet for Healthcare Providers: IncredibleEmployment.be  This test is not yet approved or cleared by the [place] FDA and has been authorized for detection and/or diagnosis of SARS-CoV-2 by FDA under an Emergency Use Authorization (EUA). This EUA will remain in effect (meaning this test can be used) for the duration of the COVID-19 declaration under Section 564(b)(1) of the Act, 21 U.S.C. section 360bbb-3(b)(1), unless the authorization is terminated or revoked.  Performed at [facility], [place] [address] [place], [place] [address]   Blood Culture (routine x 2)     Status: None (Preliminary result)   Collection Time: 05/08/20  1:04 PM   Specimen: BLOOD  Result Value Ref Range Status   Specimen Description BLOOD SITE NOT SPECIFIED  Final   Special Requests   Final    BOTTLES DRAWN AEROBIC AND ANAEROBIC Blood Culture adequate volume   Culture   Final    NO GROWTH 2 DAYS Performed at [facility] [address] [place], [place] [address]    Report Status PENDING  Incomplete  MRSA PCR Screening     Status: None   Collection Time: 05/08/20  8:17 PM   Specimen: Nasal Mucosa; Nasopharyngeal  Result Value Ref Range Status   MRSA by PCR NEGATIVE NEGATIVE Final    Comment:        The GeneXpert MRSA Assay (FDA approved for NASAL specimens only), is one component of a comprehensive MRSA colonization surveillance program. It is not intended to diagnose MRSA infection nor to guide or monitor treatment for MRSA infections. Performed at [facility], [place] [address], [place], [place] [address]     RN Pressure Injury Documentation:     Estimated body mass index is 22.73  kg/m as calculated from the following:   Height as of this encounter: _0  (1.778 m).   Weight as of this encounter: 71.8 kg.  Malnutrition Type: Nutrition  Problem: Increased nutrient needs Etiology: chronic illness (COPD) Malnutrition Characteristics: Signs/Symptoms: estimated needs Nutrition Interventions: Interventions: Ensure Enlive (each supplement provides 350kcal and 20 grams of protein),MVI  Radiology Studies: DG Chest 2 View  Result Date: 05/08/2020 CLINICAL DATA:  Shortness of breath. Trach cough. History of lung cancer. EXAM: CHEST - 2 VIEW COMPARISON:  04/17/2020, 04/05/2020 FINDINGS: Irregular cavitary right perihilar mass with adjacent airspace opacity. The degree of adjacent airspace opacity has slightly improved from prior. Improving aeration within the right lower lobe. Increasing patchy interstitial opacities within the left mid to lower lung fields. Small right pleural effusion. No pneumothorax. IMPRESSION: 1. Increasing patchy interstitial opacities within the left mid to lower lung fields, suspicious for multifocal infection. 2. Improving aeration of the right lower lobe. 3. Small right pleural effusion. 4. Persistent cavitary right perihilar mass with adjacent airspace opacity. Degree of airspace opacity has slightly improved. Electronically Signed   By: Davina Poke D.O.   On: 05/08/2020 11:23   CT Angio Chest PE W and/or Wo Contrast  Addendum Date: 05/08/2020   ADDENDUM REPORT: 05/08/2020 15:48 ADDENDUM: These results were called by telephone at the time of interpretation on 05/08/2020 at 3:47 pm to provider Vanetta Mulders, MD is, who verbally acknowledged these results. Electronically Signed   By: Zetta Bills M.D.   On: 05/08/2020 15:48   Result Date: 05/08/2020 CLINICAL DATA:  Shortness of breath EXAM: CT ANGIOGRAPHY CHEST WITH CONTRAST TECHNIQUE: Multidetector CT imaging of the chest was performed using the standard protocol during bolus administration of intravenous contrast. Multiplanar CT image reconstructions and MIPs were obtained to evaluate the vascular anatomy. CONTRAST:  21m OMNIPAQUE IOHEXOL 350 MG/ML SOLN  COMPARISON:  April 17, 2020 FINDINGS: Cardiovascular: Normal caliber thoracic aorta. Normal heart size with small to moderate pericardial effusion that is similar to the prior study. Scattered mild aortic atherosclerosis. No sign of pulmonary embolism. Study quality is adequate though mildly limited by bolus timing Mediastinum/Nodes: No thoracic inlet, axillary or LEFT hilar adenopathy. Fullness of soft tissue about the LEFT infrahilar region and subcarinal region with similar appearance to recent imaging. Subcarinal nodal tissue approximately 1.5 cm short axis as compared to 1.6 cm on the prior study. Lungs/Pleura: Cavitary area in the RIGHT upper lobe extending to the RIGHT lung apex from the RIGHT hilum in communication with the RIGHT mainstem bronchus best seen on image 46 of series 7. Small amount of fluid dependently within this location. The cavity measuring 4.9 x 2.7 cm as compared to 5.1 x 3.7 cm. Consolidative changes in the RIGHT upper and RIGHT middle lobes with bronchiectatic changes with similar appearance. Soft tissue/consolidative process in the medial RIGHT lung base in the as ago esophageal recess measuring approximately 3.6 x 2.8 cm is stable compared to previous imaging. Since the prior study there is been worsening of ground-glass outside of areas of lung involved with post treatment changes with diffuse ground-glass nodularity throughout the LEFT chest with peripheral predominance and basilar predominance. Small pleural effusions and or mild pleural thickening with slight increase bilaterally. Upper Abdomen: Incidental imaging of upper abdominal contents without acute process. Musculoskeletal: No acute musculoskeletal process. Question of permeative changes in the RIGHT proximal humerus about the humeral head. Not evident on more remote radiographs. Review of the MIP images confirms the above findings. IMPRESSION: 1. No evidence  of pulmonary embolism. 2. 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 3. Worsening of potential multifocal infection, correlate with any history of COVID-19 infection given appearance 4. The appearance of multifocal pneumonia could also be related to a large bronchopleural communication, RIGHT mainstem bronchus in communication with cavitary focus in the RIGHT upper lobe and dissemination subsequently of infected and or inflammatory material throughout bronchi in the chest resulting in pneumonitis. 5. Question of permeative changes in the RIGHT proximal humerus about the humeral head. Not evident on more remote radiographs. Correlation with any symptoms in this area suggested. Subtle changes seen at the margin of images from November. Dedicated humeral imaging may be helpful. Primary differential consideration given relative chronicity would be aggressive osteopenia. Correlate with any pain that might indicate infection. Aggressive osteopenia could also be seen with limited use of the RIGHT upper extremity, imply based on arm position. 6. Small to moderate pericardial effusion, similar to the prior study. 7. Small pleural effusions and or mild pleural thickening with slight increase bilaterally. 8. Aortic atherosclerosis. Call is out to the referring provider to further discuss findings in the above case. Aortic Atherosclerosis (ICD10-I70.0). Electronically Signed: By: Zetta Bills M.D. On: 05/08/2020 15:36   DG CHEST PORT 1 VIEW  Result Date: 05/10/2020 CLINICAL DATA:  Follow-up multifocal infection, history of right lung cancer EXAM: PORTABLE CHEST 1 VIEW COMPARISON:  05/08/2020 chest radiograph. FINDINGS: Stable cardiomediastinal silhouette with normal heart size. No pneumothorax. Volume loss in the right hemithorax with dense patchy right perihilar lung consolidation and distortion, mildly worsened. Mild to moderate patchy opacities throughout the left lung,  similar. IMPRESSION: Stable mild to moderate patchy opacities throughout the left lung compatible with multilobar pneumonia. Volume loss in the right hemithorax and dense patchy right perihilar lung consolidation and distortion, mildly worsened, favor predominantly evolving post treatment change. Electronically Signed   By: Ilona Sorrel M.D.   On: 05/10/2020 08:40   DG Humerus Right  Result Date: 05/08/2020 CLINICAL DATA:  Right shoulder pain with limited mobility EXAM: RIGHT HUMERUS - 2+ VIEW COMPARISON:  06/21/2019 FINDINGS: There is no evidence of fracture or other focal bone lesions. Soft tissues are unremarkable. IMPRESSION: Negative. Electronically Signed   By: Donavan Foil M.D.   On: 05/08/2020 16:17   Scheduled Meds: . sodium chloride   Intravenous Once  . dextromethorphan-guaiFENesin  1 tablet Oral BID  . enoxaparin (LOVENOX) injection  40 mg Subcutaneous Q24H  . feeding supplement  237 mL Oral TID BM  . fenofibrate  160 mg Oral Daily  . ferrous sulfate  325 mg Oral Q breakfast  . folic acid  1 mg Oral Daily  . lidocaine  1 patch Transdermal Q24H  . metroNIDAZOLE  500 mg Oral Q8H  . multivitamin with minerals  1 tablet Oral Daily  . potassium chloride  40 mEq Oral BID  . predniSONE  10 mg Oral Q breakfast  . cyanocobalamin  1,000 mcg Oral Daily   Continuous Infusions: . ceFEPime (MAXIPIME) IV 2 g (05/10/20 0630)  . magnesium sulfate bolus IVPB 3 g (05/10/20 0941)    LOS: 2 days   Kerney Elbe, DO [facility] PAGER is on [place]  If 7PM-7AM, please contact night-coverage [email or web address]

## 2020-05-10 NOTE — Progress Notes (Signed)
CRITICAL VALUE ALERT  Critical Value:  Hemoglobin 6.8  Date & Time Notied:  05/10/20, 0303  Provider Notified: text paged DR. H Duncan  Orders Received/Actions taken: see progress notes.

## 2020-05-10 NOTE — Progress Notes (Signed)
Night floor coverage  Hemoglobin 6.8.  Was 7.0 24 hours prior. Essentially stable.  No overt bleeding.   Likely secondary to chronic iron deficiency anemia.   Continue ferrous sulfate and continue to monitor.

## 2020-05-10 NOTE — Plan of Care (Signed)
  Problem: Education: Goal: Knowledge of General Education information will improve Description Including pain rating scale, medication(s)/side effects and non-pharmacologic comfort measures Outcome: Progressing   

## 2020-05-11 DIAGNOSIS — J189 Pneumonia, unspecified organism: Secondary | ICD-10-CM

## 2020-05-11 LAB — CBC WITH DIFFERENTIAL/PLATELET
Abs Immature Granulocytes: 0.09 10*3/uL — ABNORMAL HIGH (ref 0.00–0.07)
Basophils Absolute: 0 10*3/uL (ref 0.0–0.1)
Basophils Relative: 0 %
Eosinophils Absolute: 0 10*3/uL (ref 0.0–0.5)
Eosinophils Relative: 0 %
HCT: 25 % — ABNORMAL LOW (ref 39.0–52.0)
Hemoglobin: 7.8 g/dL — ABNORMAL LOW (ref 13.0–17.0)
Immature Granulocytes: 1 %
Lymphocytes Relative: 9 %
Lymphs Abs: 0.9 10*3/uL (ref 0.7–4.0)
MCH: 27.1 pg (ref 26.0–34.0)
MCHC: 31.2 g/dL (ref 30.0–36.0)
MCV: 86.8 fL (ref 80.0–100.0)
Monocytes Absolute: 1 10*3/uL (ref 0.1–1.0)
Monocytes Relative: 9 %
Neutro Abs: 8.2 10*3/uL — ABNORMAL HIGH (ref 1.7–7.7)
Neutrophils Relative %: 81 %
Platelets: 461 10*3/uL — ABNORMAL HIGH (ref 150–400)
RBC: 2.88 MIL/uL — ABNORMAL LOW (ref 4.22–5.81)
RDW: 16 % — ABNORMAL HIGH (ref 11.5–15.5)
WBC: 10.2 10*3/uL (ref 4.0–10.5)
nRBC: 0 % (ref 0.0–0.2)

## 2020-05-11 LAB — COMPREHENSIVE METABOLIC PANEL
ALT: 11 U/L (ref 0–44)
AST: 11 U/L — ABNORMAL LOW (ref 15–41)
Albumin: 1.9 g/dL — ABNORMAL LOW (ref 3.5–5.0)
Alkaline Phosphatase: 68 U/L (ref 38–126)
Anion gap: 9 (ref 5–15)
BUN: 11 mg/dL (ref 6–20)
CO2: 26 mmol/L (ref 22–32)
Calcium: 8.3 mg/dL — ABNORMAL LOW (ref 8.9–10.3)
Chloride: 105 mmol/L (ref 98–111)
Creatinine, Ser: 0.55 mg/dL — ABNORMAL LOW (ref 0.61–1.24)
GFR, Estimated: >60 mL/min (ref >60)
Glucose, Bld: 83 mg/dL (ref 70–99)
Potassium: 3.4 mmol/L — ABNORMAL LOW (ref 3.5–5.1)
Sodium: 140 mmol/L (ref 135–145)
Total Bilirubin: 0.7 mg/dL (ref 0.3–1.2)
Total Protein: 5.5 g/dL — ABNORMAL LOW (ref 6.5–8.1)

## 2020-05-11 LAB — TYPE AND SCREEN
ABO/RH(D): O POS
Antibody Screen: NEGATIVE
Unit division: 0

## 2020-05-11 LAB — BPAM RBC
Blood Product Expiration Date: 202202152359
ISSUE DATE / TIME: 202201161105
Unit Type and Rh: 5100

## 2020-05-11 LAB — PHOSPHORUS: Phosphorus: 1.7 mg/dL — ABNORMAL LOW (ref 2.5–4.6)

## 2020-05-11 LAB — MAGNESIUM: Magnesium: 1.8 mg/dL (ref 1.7–2.4)

## 2020-05-11 MED ORDER — PREDNISONE 10 MG PO TABS: 10.0000 mg | ORAL_TABLET | Freq: Every day | ORAL | 0 refills | Status: DC

## 2020-05-11 MED ORDER — AMOXICILLIN-POT CLAVULANATE 875-125 MG PO TABS: 1.0000 | ORAL_TABLET | Freq: Two times a day (BID) | ORAL | 0 refills | Status: AC

## 2020-05-11 MED ORDER — CYANOCOBALAMIN 1000 MCG PO TABS: 1000.0000 ug | ORAL_TABLET | Freq: Every day | ORAL | 0 refills | Status: AC

## 2020-05-11 MED ORDER — POTASSIUM PHOSPHATES 15 MMOLE/5ML IV SOLN
20.0000 mmol | Freq: Once | INTRAVENOUS | Status: AC
  Administered 2020-05-11: 20 mmol via INTRAVENOUS
  Filled 2020-05-11: qty 6.67

## 2020-05-11 MED ORDER — POTASSIUM CHLORIDE CRYS ER 20 MEQ PO TBCR
40.0000 meq | EXTENDED_RELEASE_TABLET | Freq: Once | ORAL | Status: AC
  Administered 2020-05-11: 40 meq via ORAL
  Filled 2020-05-11: qty 2

## 2020-05-11 MED ORDER — FERROUS SULFATE 325 (65 FE) MG PO TABS: 325.0000 mg | ORAL_TABLET | Freq: Every day | ORAL | 0 refills | Status: AC

## 2020-05-11 MED ORDER — ENSURE ENLIVE PO LIQD: 237.0000 mL | Freq: Three times a day (TID) | ORAL | 12 refills | Status: DC

## 2020-05-11 NOTE — Progress Notes (Signed)
Pharmacy Antibiotic Note  Richard Li is a 60 y.o. male admitted on 05/08/2020 with pneumonia.  cefepime dosing (day 4 of antibiotics -SCr= 0.5, WBC= 10.2, afebrile -cultures- ngtd   Plan: Cefepime 2g IV q8h Will follow renal function, cultures and clinical progress    Height: 5\' 10"  (177.8 cm) Weight: 71.8 kg (158 lb 6.4 oz) IBW/kg (Calculated) : 73  Temp (24hrs), Avg:97.9 F (36.6 C), Min:97.5 F (36.4 C), Max:98.4 F (36.9 C)  Recent Labs  Lab 05/08/20 1135 05/08/20 1137 05/08/20 1249 05/08/20 2033 05/09/20 0125 05/10/20 0124 05/11/20 0103  WBC  --  19.4*  --   --  15.2* 15.4* 10.2  CREATININE 0.72  --   --   --  0.57* 0.64 0.55*  LATICACIDVEN  --   --  1.1 1.0  --   --   --     Estimated Creatinine Clearance: 101.1 mL/min (A) (by C-G formula based on SCr of 0.55 mg/dL (L)).    No Known Allergies  Antimicrobials this admission: 1/14 vancomycin >> 1/15 1/14 cefepime >>   Hildred Laser, PharmD Clinical Pharmacist **Pharmacist phone directory can now be found on [email or web address] ([email or web address]).  Listed under [place].

## 2020-05-11 NOTE — Progress Notes (Signed)
NAME:  Richard Li, MRN:  [id number], DOB:  04-19-1961, LOS: 3 ADMISSION DATE:  05/08/2020, CONSULTATION DATE:  05/08/2020 REFERRING MD:  Dr. Gordy Levan, CHIEF COMPLAINT:  Recurrent PNA   Brief History:  75 yoM with recent dx of right lung adenocarcinoma stage IIB in 12/2019 s/p chemo and radiation presenting with pleuritic left chest pain, worsening SOB, and ongoing productive cough, no fever or chills, with CT chest showing worsening multifocal pneumonia despite recent multiple rounds of abx and steroids with negative bronch 04/05/2020. Pulmonary consulted for recurrent pneumonia.   History of Present Illness:   60 year old male recent diagnosis of adenocarcinoma of right lung in 12/2019, former smoker, COPD, and HTN presenting from home with worsening shortness of breath, ongoing productive green mucous sputum, and left sided chest pain, sharp in nature and worse with coughing/ movement.  Has additionally had poor PO intake at home.  Denies fever, chills, change in sputum production or color, no bleeding.    He has had four prior hospitalizations since November with multiple antibiotic and steroid courses without improvement in symptoms or radiographic improvement.   Previous SLP showed mild aspiration risk. S/p chemoradiation with radiation completed 11/30 and 4 cycles of carboplatin/paclitaxel on 11/15.  Had bronchoscopy during recent hospitalization on 04/05/2020 with negative workup. He is followed in our office, last seen on 12/20 by Dr. Silas Flood with concern for post-obstructive pneumonia vs lymphangitic spread and was given additional 14 days of augmentin without improvement followed by steroid course, with no improvement as well.     In ER, was afebrile and initially hypotensive with SBP in the 80's and tachycardia of 130's on ER arrival. Room air saturations 95%.  Labs noted for WBC 19.4, Hgb 8, Plts 703, LA 1.1, neg trop hs x 2, non acute EKG, K 3, albumin 2.3, COVID/ flu neg. CXR showed  progressive multifocal pneumonia, small right pleural effusion, and persistent cavitary right perihilar mass with adjacent airspace opacity.  CTA PE was performed which was negative for PE but showed worsening multifocal infection and RUL bronchopleural fistula.  BP improved after fluid bolus and empirically started on stress dose steroids given multiple courses of steroids.  Cultured and started on empiric vancomycin and cefepime. He remains hemodynamically stable and on room air.  TRH to admit, Pulmonary consulted for recurrent pneumonia.   Past Medical History:  Former smoker, adenocarcinoma of right lung in 12/2019, COPD, and HTN  Significant Hospital Events:  1/14 admitted [place]  Consults:  Pulmonary   Procedures:    Significant Diagnostic Tests:  05/08/2020 CTA PE >> 1. No evidence of pulmonary embolism. 2. 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 3. Worsening of potential multifocal infection, correlate with any history of COVID-19 infection given appearance 4. The appearance of multifocal pneumonia could also be related to a large bronchopleural communication, RIGHT mainstem bronchus in communication with cavitary focus in the RIGHT upper lobe and dissemination subsequently of infected and or inflammatory material throughout bronchi in the chest resulting in pneumonitis. 5. Question of permeative changes in the RIGHT proximal humerus about the humeral head. Not evident on more remote radiographs. Correlation with any symptoms in this area suggested. Subtle changes seen at the margin of images from November. Dedicated humeral imaging may be helpful. Primary differential consideration given relative chronicity would be aggressive osteopenia. Correlate with any pain that might indicate infection. Aggressive osteopenia could also be seen with  limited use of the RIGHT upper extremity,  imply based on arm position. 6. Small to moderate pericardial effusion, similar to the prior study. 7. Small pleural effusions and or mild pleural thickening with slight increase bilaterally. 8. Aortic atherosclerosis.  Micro Data:  1/14 SARS 2/ Flu >> Negative  1/14 BCx 2 >> 1/14 UC >> Negative  1/14 MRSA >> Negative   Antimicrobials:  1/14 vanc >> 1/14 cefepime >>  Interim History / Subjective:  Seen sitting up in bed, states "I feel the same, can I go home today" Continues to report significant lime green sputum production  Objective   Blood pressure 119/64, pulse (!) 104, temperature 97.6 F (36.4 C), temperature source Oral, resp. rate 20, height 5\' 10"  (1.778 m), weight 71.8 kg, SpO2 91 %.        Intake/Output Summary (Last 24 hours) at 05/11/2020 0910 Last data filed at 05/10/2020 1415 Gross per 24 hour  Intake 372.92 ml  Output --  Net 372.92 ml   Filed Weights   05/08/20 1050 05/08/20 2015  Weight: 72.6 kg 71.8 kg    Examination: General: Slightly deconditioned middle aged male lying in bed in NAD HEENT: [place]/AT, MM pink/moist, PERRL,  Neuro: Alert and oriented x4, non-focal  CV: s1s2 regular rate and rhythm, no murmur, rubs, or gallops,  PULM:  Clear breath sounds, bilaterally, slightly diminished breath sounds bilaterally, no increased work of breathing  GI: soft, bowel sounds active in all 4 quadrants, non-tender, non-distended Extremities: warm/dry, no edema  Skin: no rashes or lesions  Resolved Hospital Problem list   Hypotension   Assessment & Plan:   Recurrent PNA -ddx post-obstruction pna vs lymphangitic spread -Patient recently had a bronchoscopy that did not reveal any organisms -Patient recently completed started on a course of Augmentin started on 05/01/2020 -Was recently on steroids for possible radiation pneumonitis-steroids was started 04/27/2020 -Doubt if bronchoscopy will be helpful in the short-term may be helpful if the concern for  lymphangitic spread is the overarching diagnosis -Possibility of radiation pneumonitis causing infiltrative process P: Continues to remain afebrile  Procalcitonin reassuring  Leukocytosis improving  Consider descilation of antibiotics today, will discuss with attending  Continue to trend WBC and fever curve  Does not appear that sputum culture was collected  Ensure adequate pain management  Encourage frequent pulmonary hygiene    Johnsie Cancel, NP-C [facility] Contact / Pager information can be found on Amion  05/11/2020, 9:21 AM

## 2020-05-11 NOTE — Discharge Summary (Signed)
Physician Discharge Summary  KAVONTE BEARSE DGL:[id number] DOB: 23-[name]-1960 DOA: 05/08/2020  PCP: [name], DO  Admit date: 05/08/2020 Discharge date: 05/11/2020  Admitted From: Home Disposition:  Home  Recommendations for Outpatient Follow-up:  1. Follow up with PCP in 1-2 weeks 2. Follow up with Pulmonary in 1-2 weeks 3. Please obtain CMP/CBC, Mag, Phos in one week 4. Please follow up on the following pending results: Sputum Cx results for Bacterial, Fungal, and AFB  Home Health: No  Equipment/Devices: None  Discharge Condition: Stable CODE STATUS: FULL CODE Diet recommendation: Heart Healthy Diet   Brief/Interim Summary: HPI per Dr. Wynetta Fines on 05/08/20 Kenard W Maynardis a 60 y.o.malewith medical history significant ofstage II non-small cell lung cancer status post radiation therapy, COPD, HTN, HLD, recurrent right upper lobe pneumonia, presented with increasing cough shortness of breath and chest pain.  Patient was treated for right upper lobe pneumonia since November 2021, it was last admitted in mid December and was discharged home with 7 days of Augmentin. Patient reported that he continued to have productive cough with thick yellow and full smell sputum, but no fever.He has been followed by pulmonology as outpatient who recently decided to extend his p.o. antibiotic treatment. And patient since discharge has received 4 weeks of p.o. antibiotics +2 weeks of p.o. steroid 20 mg daily which both of them he has been still taking until today.  Last 2 days, he developed left-sided pleuritic chest pain, worsening with cough and chest movement, he decided any coughing up blood, no fever or chills. But he does have exertional dyspnea which gradually getting worse. ED Course:Blood pressure on the lower side, was given 2.5 L IV boluses and chest x-ray showed concerning increasing right upper lobe infiltration for pneumonia. Broad-spectrum antibiotic vancomycin plus cefepime  started in ED. CTA negative for PE, but showed persistent infiltrates consolidation on the right mid and upper lobe. Lab work WBC 19.4, hemoglobin 8.0 compared to 9.0 last month. Potassium 3.0.  **Interim History  Pulmonary recommending continuing Abx and holding off on Bronchoscopy for now. De-escalated Abx off of Vanc given MRSA PCR being negat. Continue to Monitor and  repeat CXR this AM showed "Stable mild to moderate patchy opacities throughout the left lung compatible with multilobar pneumonia. Volume loss in the right hemithorax and dense patchy right perihilar lung consolidation and distortion, mildly worsened, favor predominantly evolving post treatment change."  Patient continues to cough significantly and his hemoglobin dropped slightly so we will type and screen and transfuse 1 unit of PRBCs.  Electrolytes are also abnormal so we will replete.  He is improved but continues to Cough. Pulmonary evaluated and deemed patient stable for D/C. He will follow up with Pulmonary within 1 week and continue Abx and Steroids. Pulmonary to follow up on his Sputum Cx.   Discharge Diagnoses:  Active Problems:   PNA (pneumonia)   Sepsis ([place])   Multifocal pneumonia   Iron deficiency anemia   Leukocytosis  Recurrent right upper and middle lobe pneumonia -Suspect obstructive pneumonia/pneumonitis -Failed outpatient treatment with Augmentin started 05/01/2020 and he was also started on steroids for possible radiation pneumonitis history was restarted on 04/27/2020. -Discussed with pulmonology, probably will need repeat bronchoscopy versus some kind of ablation treatment if it scar tissue from post radiation causing the obstruction. -Pulmonary evaluated and he had a recent bronchoscopy about 3 to 4 weeks ago that did not reveal any organisms -At that time bronchoscopy sampling only revealed histology specimen from the airway. -Occurred with  broad-spectrum antibiotics, will give also anaerobic coverage  givenfoulsmell sputum described the patient and his family.  -MRSA screen sent and since it was negative will discontinue vancomycin -Pulmonary feels that he has extensive changes in the lung and is difficult to decipher whether this is just related to his lymphangitic spread or pneumonic infiltrate -Currently he is not having fevers or chills and leukocytosis could be attributable to his steroid demargination -Pulmonary feels that repeating a bronchoscopy would not really be helpful in the short-term but may be helpful if there is concern for lymphangitic spread -Recommended continuing antibiotic course but we have de-escalate vancomycin given his negative MRSA -Pulmonary recommending sending sputum for gram stain culture received which will guide into by therapy  -He is started on hydrocortisone but this can be changed to prednisone 10 mg daily and consider weaning this soon as possible and his we have done this now -Continues to cough up his sputum significantly -Repeat chest x-ray showed "Stable mild to moderate patchy opacities throughout the left lung compatible with multilobar pneumonia. Volume loss in the right hemithorax and dense patchy right perihilar lung consolidation and distortion, mildly worsened, favor predominantly evolving post treatment change." -C/w Combivent, Mucinex DM -Further care per pulmonary and they have recommended a sputum culture for AFB, fungal, bacterial organisms -Pulmonary has cleared the patient for discharge recommending Augmentin for 14 days and continuing steroids for 10 mg p.o. daily-he has an appointment with pulmonary soon and will follow-up within 1 week and they will discuss sputum culture results at that time.  He has been stable for discharge now and he did not desaturate on ambulatory home O2 screen.  Sepsis, improving slowly -Secondary to right-sided pneumonia -Evidenced by hypotension, tachycardia and tachypnea, source is pneumonia; he did also  have a leukocytosis but this could be likely from steroids -Stress dose steroid given and this has not been stopped as- -Start maintenance IV fluid normal saline 125 mL/h x 12 hours now stopped  -continue monitor-pulmonary consulted -Sepsis physiology is improving and WBC is trending down from 19.4 and is now 15.2 yesterday and today it is 15.4 and is improved to 10.2 remain elevated in the setting of steroid demargination given his hydrocortisone and he is no longer tachycardic or tachypneic -PRoCalcitonin level is 0.23 and has slightly trended up to 0.28 and is now trending back down to 0.12 -Continue to Monitor for S/Sx of Infection and pulmonary is cleared the patient for discharge  Hypokalemia -Patient's potassium this morning was 3.4 -Replete with p.o. potassium chloride 40 mill course twice daily x2 doses -Continue to monitor and replete as necessary -Repeat CMP within 1 week  Hypomagnesemia -Patient's magnesium level this morning was 1.7 -Replete with  IV mag sulfate 2 g -Continue to monitor and replete as necessary -Repeat magnesium level within 1 week  Pleural chest pain -From strenuous cough, ACS rule out. -Lidocaine patch and Toradol.  Stage II lung CA -Outpatient oncology follow-up -May discuss with his oncologist in the morning but since he has been discharged follow-up with them as outpatient setting  Acute on Chronic iron deficiency anemia/anemia of chronic disease -Iron studies done last admission, start iron supplement and C/w Ferrous Sulfate 325 mg po Daily  -Patient's Hgb/Hct went from 8.0/27.3 -> 7.0/24.0 and now has further trended down to 6.8/21.7 -We will type and screen and transfuse 1 unit of PRBCs; after transfusion of 1 unit of PRBCs his hemoglobin/marked improved to 7.8/25.0 -Checked Anemia Panel prior to blood transfusion and showed iron  level of 34, U IBC 127, TIBC 161, saturation ratios of 21%, ferritin level of 828, folate of 16.6, and vitamin B12  level 440 -Continue to Monitor S/Sx of Bleeding; No overt bleeding noted -Repeat CBC in the AM    Discharge Instructions  Discharge Instructions    Call MD for:  difficulty breathing, headache or visual disturbances   Complete by: As directed    Call MD for:  extreme fatigue   Complete by: As directed    Call MD for:  hives   Complete by: As directed    Call MD for:  persistant dizziness or light-headedness   Complete by: As directed    Call MD for:  persistant nausea and vomiting   Complete by: As directed    Call MD for:  redness, tenderness, or signs of infection (pain, swelling, redness, odor or green/yellow discharge around incision site)   Complete by: As directed    Call MD for:  severe uncontrolled pain   Complete by: As directed    Call MD for:  temperature >100.4   Complete by: As directed    Diet - low sodium heart healthy   Complete by: As directed    Discharge instructions   Complete by: As directed    You were cared for by a hospitalist during your hospital stay. If you have any questions about your discharge medications or the care you received while you were in the hospital after you are discharged, you can call the unit and ask to speak with the hospitalist on call if the hospitalist that took care of you is not available. Once you are discharged, your primary care physician will handle any further medical issues. Please note that NO REFILLS for any discharge medications will be authorized once you are discharged, as it is imperative that you return to your primary care physician (or establish a relationship with a primary care physician if you do not have one) for your aftercare needs so that they can reassess your need for medications and monitor your lab values.  Follow up with PCP and Pulmonary within 1 week. Take all medications as prescribed. If symptoms change or worsen please return to the ED for evaluation   Increase activity slowly   Complete by: As  directed      Allergies as of 05/11/2020   No Known Allergies     Medication List    TAKE these medications   acetaminophen 500 MG tablet Commonly known as: TYLENOL Take 1,000 mg by mouth 2 (two) times daily.   amoxicillin-clavulanate 875-125 MG tablet Commonly known as: AUGMENTIN Take 1 tablet by mouth 2 (two) times daily for 14 days.   Combivent Respimat 20-100 MCG/ACT Aers respimat Generic drug: Ipratropium-Albuterol Inhale 1-2 puffs into the lungs every 6 (six) hours as needed for wheezing.   cyanocobalamin 1000 MCG tablet Take 1 tablet (1,000 mcg total) by mouth daily.   dextromethorphan-guaiFENesin 30-600 MG 12hr tablet Commonly known as: MUCINEX DM Take 1 tablet by mouth 2 (two) times daily.   feeding supplement Liqd Take 237 mLs by mouth 3 (three) times daily between meals.   fenofibrate 160 MG tablet Take 1 tablet (160 mg total) by mouth daily.   ferrous sulfate 325 (65 FE) MG tablet Take 1 tablet (325 mg total) by mouth daily with breakfast. Start taking on: May 13, 7827   folic acid 1 MG tablet Commonly known as: FOLVITE Take 1 tablet (1 mg total) by mouth daily.  LORazepam 1 MG tablet Commonly known as: ATIVAN TAKE 1 TABLET BY MOUTH THREE TIMES DAILY AS NEEDED FOR ANXIETY What changed:   how much to take  how to take this  when to take this  reasons to take this  additional instructions   multivitamin with minerals Tabs tablet Take 1 tablet by mouth daily.   predniSONE 10 MG tablet Commonly known as: DELTASONE Take 1 tablet (10 mg total) by mouth daily with breakfast. Start taking on: May 12, 2020 What changed:   medication strength  how much to take   prochlorperazine 10 MG tablet Commonly known as: COMPAZINE TAKE 1 TABLET BY MOUTH EVERY 6 HOURS AS NEEDED FOR NAUSEA FOR VOMITING What changed: See the new instructions.       No Known Allergies  Consultations:  Pulmonary  Procedures/Studies: DG Chest 2  View  Result Date: 05/08/2020 CLINICAL DATA:  Shortness of breath. Trach cough. History of lung cancer. EXAM: CHEST - 2 VIEW COMPARISON:  04/17/2020, 04/05/2020 FINDINGS: Irregular cavitary right perihilar mass with adjacent airspace opacity. The degree of adjacent airspace opacity has slightly improved from prior. Improving aeration within the right lower lobe. Increasing patchy interstitial opacities within the left mid to lower lung fields. Small right pleural effusion. No pneumothorax. IMPRESSION: 1. Increasing patchy interstitial opacities within the left mid to lower lung fields, suspicious for multifocal infection. 2. Improving aeration of the right lower lobe. 3. Small right pleural effusion. 4. Persistent cavitary right perihilar mass with adjacent airspace opacity. Degree of airspace opacity has slightly improved. Electronically Signed   By: Davina Poke D.O.   On: 05/08/2020 11:23   CT Chest Wo Contrast  Result Date: 04/17/2020 CLINICAL DATA:  Unresolved pneumonia. History of lung cancer. Cough 3 months and worsening shortness of breath 1 week. EXAM: CT CHEST WITHOUT CONTRAST TECHNIQUE: Multidetector CT imaging of the chest was performed following the standard protocol without IV contrast. COMPARISON:  03/13/2020 FINDINGS: Cardiovascular: Heart is normal size. There is a small pericardial effusion. Thoracic aorta is unremarkable. Remaining vascular structures are unchanged. Mediastinum/Nodes: There is cardiomediastinal shift to the right which is more pronounced. 1.6 cm subcarinal lymph node without significant change. Few other shotty mediastinal lymph nodes. No definite hilar adenopathy on this noncontrast exam. Remaining mediastinal structures are unremarkable. 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 There is associated adjacent consolidation  with worsening consolidation over the anteromedial right upper lobe/middle lobe. Mild improved patchy airspace opacification with better aeration over the right lower lobe and right middle lobe. Tiny right pleural fluid collection improved. Persistent patchy airspace process throughout the left lung slightly worse overall. Stable discrete 5-6 mm solid nodule over the left lower lobe. No left effusion. Airways unremarkable. Upper Abdomen: No acute findings. Musculoskeletal: Unchanged. IMPRESSION: 1. 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 Stable adjacent consolidation with worsening consolidation over the anteromedial right upper lobe/middle lobe. Improved patchy airspace opacification with better aeration over the right lower lobe and right middle lobe. Tiny right pleural fluid collection improved. 2. Persistent patchy airspace process throughout the left lung slightly worse overall which may be due to ongoing infection. Stable 5-6 mm solid nodule over the left lower lobe as recommend attention on follow-up.  Stable 1.6 cm subcarinal lymph node. 3. Small pericardial effusion. Electronically Signed   By: Marin Olp M.D.   On: 04/17/2020 14:02   CT Angio Chest PE W and/or Wo Contrast  Addendum Date: 05/08/2020   ADDENDUM REPORT: 05/08/2020 15:48 ADDENDUM: These results were called by telephone at the time of interpretation on 05/08/2020 at 3:47 pm to provider Vanetta Mulders, MD is, who verbally acknowledged these results. Electronically Signed   By: Zetta Bills M.D.   On: 05/08/2020 15:48   Result Date: 05/08/2020 CLINICAL DATA:  Shortness of breath EXAM: CT ANGIOGRAPHY CHEST WITH CONTRAST TECHNIQUE: Multidetector CT imaging of the chest was performed using the standard protocol during bolus administration of intravenous contrast. Multiplanar CT image reconstructions and MIPs were  obtained to evaluate the vascular anatomy. CONTRAST:  58mL OMNIPAQUE IOHEXOL 350 MG/ML SOLN COMPARISON:  April 17, 2020 FINDINGS: Cardiovascular: Normal caliber thoracic aorta. Normal heart size with small to moderate pericardial effusion that is similar to the prior study. Scattered mild aortic atherosclerosis. No sign of pulmonary embolism. Study quality is adequate though mildly limited by bolus timing Mediastinum/Nodes: No thoracic inlet, axillary or LEFT hilar adenopathy. Fullness of soft tissue about the LEFT infrahilar region and subcarinal region with similar appearance to recent imaging. Subcarinal nodal tissue approximately 1.5 cm short axis as compared to 1.6 cm on the prior study. Lungs/Pleura: Cavitary area in the RIGHT upper lobe extending to the RIGHT lung apex from the RIGHT hilum in communication with the RIGHT mainstem bronchus best seen on image 46 of series 7. Small amount of fluid dependently within this location. The cavity measuring 4.9 x 2.7 cm as compared to 5.1 x 3.7 cm. Consolidative changes in the RIGHT upper and RIGHT middle lobes with bronchiectatic changes with similar appearance. Soft tissue/consolidative process in the medial RIGHT lung base in the as ago esophageal recess measuring approximately 3.6 x 2.8 cm is stable compared to previous imaging. Since the prior study there is been worsening of ground-glass outside of areas of lung involved with post treatment changes with diffuse ground-glass nodularity throughout the LEFT chest with peripheral predominance and basilar predominance. Small pleural effusions and or mild pleural thickening with slight increase bilaterally. Upper Abdomen: Incidental imaging of upper abdominal contents without acute process. Musculoskeletal: No acute musculoskeletal process. Question of permeative changes in the RIGHT proximal humerus about the humeral head. Not evident on more remote radiographs. Review of the MIP images confirms the above  findings. IMPRESSION: 1. No evidence of pulmonary embolism. 2. 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 3. Worsening of potential multifocal infection, correlate with any history of COVID-19 infection given appearance 4. The appearance of multifocal pneumonia could also be related to a large bronchopleural communication, RIGHT mainstem bronchus in communication with cavitary focus in the RIGHT upper lobe and dissemination subsequently of infected and or inflammatory material throughout bronchi in the chest resulting in pneumonitis. 5. Question of permeative changes in the RIGHT proximal humerus about the humeral head. Not evident on more remote radiographs. Correlation with any symptoms in this area suggested. Subtle changes seen at the margin of images from November. Dedicated humeral imaging may be helpful. Primary differential consideration given relative chronicity would be aggressive osteopenia. Correlate with any pain that might indicate infection. Aggressive osteopenia could also be seen with limited use of the RIGHT upper extremity, imply based on arm position. 6. Small to moderate pericardial effusion, similar  to the prior study. 7. Small pleural effusions and or mild pleural thickening with slight increase bilaterally. 8. Aortic atherosclerosis. Call is out to the referring provider to further discuss findings in the above case. Aortic Atherosclerosis (ICD10-I70.0). Electronically Signed: By: Zetta Bills M.D. On: 05/08/2020 15:36   DG CHEST PORT 1 VIEW  Result Date: 05/10/2020 CLINICAL DATA:  Follow-up multifocal infection, history of right lung cancer EXAM: PORTABLE CHEST 1 VIEW COMPARISON:  05/08/2020 chest radiograph. FINDINGS: Stable cardiomediastinal silhouette with normal heart size. No pneumothorax. Volume loss in the right hemithorax with dense patchy right perihilar lung consolidation and  distortion, mildly worsened. Mild to moderate patchy opacities throughout the left lung, similar. IMPRESSION: Stable mild to moderate patchy opacities throughout the left lung compatible with multilobar pneumonia. Volume loss in the right hemithorax and dense patchy right perihilar lung consolidation and distortion, mildly worsened, favor predominantly evolving post treatment change. Electronically Signed   By: Ilona Sorrel M.D.   On: 05/10/2020 08:40   DG Humerus Right  Result Date: 05/08/2020 CLINICAL DATA:  Right shoulder pain with limited mobility EXAM: RIGHT HUMERUS - 2+ VIEW COMPARISON:  06/21/2019 FINDINGS: There is no evidence of fracture or other focal bone lesions. Soft tissues are unremarkable. IMPRESSION: Negative. Electronically Signed   By: Donavan Foil M.D.   On: 05/08/2020 16:17     Subjective: Seen and examined at bedside and he was feeling better but continues to cough up significant mount of sputum.  Did not desaturate but felt better since he came in.  No lightheadedness or dizziness.  No other concerns at present time is ready to go home.  Discharge Exam: Vitals:   05/11/20 0827 05/11/20 1313  BP: 119/64 (!) 116/59  Pulse: (!) 104 (!) 113  Resp: 20 20  Temp: 97.6 F (36.4 C) 98.4 F (36.9 C)  SpO2: 91% 96%   Vitals:   05/11/20 0009 05/11/20 0538 05/11/20 0827 05/11/20 1313  BP: 116/63 116/60 119/64 (!) 116/59  Pulse: 79 89 (!) 104 (!) 113  Resp: 20 (!) 26 20 20   Temp: 97.6 F (36.4 C) 98.4 F (36.9 C) 97.6 F (36.4 C) 98.4 F (36.9 C)  TempSrc: Oral Oral Oral Oral  SpO2: 96% 93% 91% 96%  Weight:      Height:       General: Pt is alert, awake, not in acute distress Cardiovascular: RRR, S1/S2 +, no rubs, no gallops Respiratory: Diminished bilaterally, no wheezing, no rhonchi, unlabored breathing but does have some coughing and is not wearing supplemental oxygen via nasal cannula Abdominal: Soft, NT, ND, bowel sounds + Extremities: trace edema, no  cyanosis  The results of significant diagnostics from this hospitalization (including imaging, microbiology, ancillary and laboratory) are listed below for reference.     Microbiology: Recent Results (from the past 240 hour(s))  Urine culture     Status: None   Collection Time: 05/08/20 11:04 AM   Specimen: In/Out Cath Urine  Result Value Ref Range Status   Specimen Description IN/OUT CATH URINE  Final   Special Requests NONE  Final   Culture   Final    NO GROWTH Performed at [facility], [address], [place], [place] [address]    Report Status 05/10/2020 FINAL  Final  Blood Culture (routine x 2)     Status: None (Preliminary result)   Collection Time: 05/08/20 12:48 PM   Specimen: BLOOD  Result Value Ref Range Status   Specimen Description BLOOD RIGHT ANTECUBITAL  Final   Special Requests   Final    BOTTLES DRAWN AEROBIC AND ANAEROBIC Blood Culture results may not be optimal due to an excessive volume of blood received in culture bottles   Culture   Final    NO GROWTH 3 DAYS Performed at [facility], [place] [address], [place], [place] [address]    Report Status PENDING  Incomplete  Resp panel by RT-PCR (RSV, Flu A&B, Covid) Nasopharyngeal Swab     Status: None   Collection Time: 05/08/20 12:52 PM   Specimen: Nasopharyngeal Swab; Nasopharyngeal(NP) swabs in vial transport medium  Result Value Ref Range Status   SARS Coronavirus 2 by RT PCR NEGATIVE NEGATIVE Final    Comment: (NOTE) SARS-CoV-2 target nucleic acids are NOT DETECTED.  The SARS-CoV-2 RNA is generally detectable in upper respiratory specimens during the acute phase of infection. The lowest concentration of SARS-CoV-2 viral copies this assay can detect is 138 copies/mL. A negative result does not preclude SARS-Cov-2 infection and should not be used as the sole basis for treatment or other patient management decisions. A negative result may occur with  improper specimen collection/handling,  submission of specimen other than nasopharyngeal swab, presence of viral mutation(s) within the areas targeted by this assay, and inadequate number of viral copies(<138 copies/mL). A negative result must be combined with clinical observations, patient history, and epidemiological information. The expected result is Negative.  Fact Sheet for Patients:  EntrepreneurPulse.com.au  Fact Sheet for Healthcare Providers:  IncredibleEmployment.be  This test is no t yet approved or cleared by the [place] FDA and  has been authorized for detection and/or diagnosis of SARS-CoV-2 by FDA under an Emergency Use Authorization (EUA). This EUA will remain  in effect (meaning this test can be used) for the duration of the COVID-19 declaration under Section 564(b)(1) of the Act, 21 U.S.C.section 360bbb-3(b)(1), unless the authorization is terminated  or revoked sooner.       Influenza A by PCR NEGATIVE NEGATIVE Final   Influenza B by PCR NEGATIVE NEGATIVE Final    Comment: (NOTE) The Xpert Xpress SARS-CoV-2/FLU/RSV plus assay is intended as an aid in the diagnosis of influenza from Nasopharyngeal swab specimens and should not be used as a sole basis for treatment. Nasal washings and aspirates are unacceptable for Xpert Xpress SARS-CoV-2/FLU/RSV testing.  Fact Sheet for Patients: EntrepreneurPulse.com.au  Fact Sheet for Healthcare Providers: IncredibleEmployment.be  This test is not yet approved or cleared by the [place] FDA and has been authorized for detection and/or diagnosis of SARS-CoV-2 by FDA under an Emergency Use Authorization (EUA). This EUA will remain in effect (meaning this test can be used) for the duration of the COVID-19 declaration under Section 564(b)(1) of the Act, 21 U.S.C. section 360bbb-3(b)(1), unless the authorization is terminated or revoked.     Resp Syncytial Virus by PCR NEGATIVE  NEGATIVE Final    Comment: (NOTE) Fact Sheet for Patients: EntrepreneurPulse.com.au  Fact Sheet for Healthcare Providers: IncredibleEmployment.be  This test is not yet approved or cleared by the [place] FDA and has been authorized for detection and/or diagnosis of SARS-CoV-2 by FDA under an Emergency Use Authorization (EUA). This EUA will remain in effect (meaning this test can be used) for the duration of the COVID-19 declaration under Section 564(b)(1) of the Act, 21 U.S.C. section 360bbb-3(b)(1), unless the authorization is terminated or revoked.  Performed at [facility], [place] [address], [place], [place] [address]   Blood Culture (routine x 2)  Status: None (Preliminary result)   Collection Time: 05/08/20  1:04 PM   Specimen: BLOOD  Result Value Ref Range Status   Specimen Description BLOOD SITE NOT SPECIFIED  Final   Special Requests   Final    BOTTLES DRAWN AEROBIC AND ANAEROBIC Blood Culture adequate volume   Culture   Final    NO GROWTH 3 DAYS Performed at [facility], [address], [place], [place] [address]    Report Status PENDING  Incomplete  MRSA PCR Screening     Status: None   Collection Time: 05/08/20  8:17 PM   Specimen: Nasal Mucosa; Nasopharyngeal  Result Value Ref Range Status   MRSA by PCR NEGATIVE NEGATIVE Final    Comment:        The GeneXpert MRSA Assay (FDA approved for NASAL specimens only), is one component of a comprehensive MRSA colonization surveillance program. It is not intended to diagnose MRSA infection nor to guide or monitor treatment for MRSA infections. Performed at [facility], [place] [address], [place], [place] [address]     Labs: BNP (last 3 results) No results for input(s): BNP in the last 8760 hours. Basic Metabolic Panel: Recent Labs  Lab 05/08/20 1135 05/09/20 0125 05/10/20 0124 05/11/20 0103  NA 136 137 138 140  K 3.0* 3.4* 3.1* 3.4*  CL 98 102  103 105  CO2 23 24 27 26   GLUCOSE 111* 114* 136* 83  BUN 13 14 14 11   CREATININE 0.72 0.57* 0.64 0.55*  CALCIUM 9.4 8.5* 8.5* 8.3*  MG  --   --  1.5* 1.8  PHOS  --   --  2.7 1.7*   Liver Function Tests: Recent Labs  Lab 05/08/20 1135 05/10/20 0124 05/11/20 0103  AST 17 12* 11*  ALT 15 10 11   ALKPHOS 95 74 68  BILITOT 0.3 0.5 0.7  PROT 6.8 5.5* 5.5*  ALBUMIN 2.3* 1.9* 1.9*   No results for input(s): LIPASE, AMYLASE in the last 168 hours. No results for input(s): AMMONIA in the last 168 hours. CBC: Recent Labs  Lab 05/08/20 1137 05/09/20 0125 05/10/20 0124 05/11/20 0103  WBC 19.4* 15.2* 15.4* 10.2  NEUTROABS  --   --  13.3* 8.2*  HGB 8.0* 7.0* 6.8* 7.8*  HCT 27.3* 24.0* 21.7* 25.0*  MCV 88.6 89.9 85.4 86.8  PLT 703* 522* 479* 461*   Cardiac Enzymes: No results for input(s): CKTOTAL, CKMB, CKMBINDEX, TROPONINI in the last 168 hours. BNP: Invalid input(s): POCBNP CBG: No results for input(s): GLUCAP in the last 168 hours. D-Dimer No results for input(s): DDIMER in the last 72 hours. Hgb A1c No results for input(s): HGBA1C in the last 72 hours. Lipid Profile No results for input(s): CHOL, HDL, LDLCALC, TRIG, CHOLHDL, LDLDIRECT in the last 72 hours. Thyroid function studies No results for input(s): TSH, T4TOTAL, T3FREE, THYROIDAB in the last 72 hours.  Invalid input(s): FREET3 Anemia work up Recent Labs    05/10/20 0124 05/10/20 1015  VITAMINB12  --  440  FOLATE  --  16.6  FERRITIN  --  828*  TIBC  --  161*  IRON  --  34*  RETICCTPCT 3.2*  --    Urinalysis    Component Value Date/Time   COLORURINE STRAW (A) 05/08/2020 1708   APPEARANCEUR CLEAR 05/08/2020 1708   LABSPEC 1.014 05/08/2020 1708   PHURINE 6.0 05/08/2020 1708   GLUCOSEU NEGATIVE 05/08/2020 1708   GLUCOSEU NEGATIVE 12/12/2011 0944   HGBUR NEGATIVE 05/08/2020 1708  BILIRUBINUR NEGATIVE 05/08/2020 1708   KETONESUR NEGATIVE 05/08/2020 1708   PROTEINUR NEGATIVE 05/08/2020 1708    UROBILINOGEN 0.2 12/12/2011 0944   NITRITE NEGATIVE 05/08/2020 1708   LEUKOCYTESUR NEGATIVE 05/08/2020 1708   Sepsis Labs Invalid input(s): PROCALCITONIN,  WBC,  LACTICIDVEN Microbiology Recent Results (from the past 240 hour(s))  Urine culture     Status: None   Collection Time: 05/08/20 11:04 AM   Specimen: In/Out Cath Urine  Result Value Ref Range Status   Specimen Description IN/OUT CATH URINE  Final   Special Requests NONE  Final   Culture   Final    NO GROWTH Performed at [facility], [place] [address], [place], [place] [address]    Report Status 05/10/2020 FINAL  Final  Blood Culture (routine x 2)     Status: None (Preliminary result)   Collection Time: 05/08/20 12:48 PM   Specimen: BLOOD  Result Value Ref Range Status   Specimen Description BLOOD RIGHT ANTECUBITAL  Final   Special Requests   Final    BOTTLES DRAWN AEROBIC AND ANAEROBIC Blood Culture results may not be optimal due to an excessive volume of blood received in culture bottles   Culture   Final    NO GROWTH 3 DAYS Performed at [facility], [place] [address], [place], [place] [address]    Report Status PENDING  Incomplete  Resp panel by RT-PCR (RSV, Flu A&B, Covid) Nasopharyngeal Swab     Status: None   Collection Time: 05/08/20 12:52 PM   Specimen: Nasopharyngeal Swab; Nasopharyngeal(NP) swabs in vial transport medium  Result Value Ref Range Status   SARS Coronavirus 2 by RT PCR NEGATIVE NEGATIVE Final    Comment: (NOTE) SARS-CoV-2 target nucleic acids are NOT DETECTED.  The SARS-CoV-2 RNA is generally detectable in upper respiratory specimens during the acute phase of infection. The lowest concentration of SARS-CoV-2 viral copies this assay can detect is 138 copies/mL. A negative result does not preclude SARS-Cov-2 infection and should not be used as the sole basis for treatment or other patient management decisions. A negative result may occur with  improper specimen collection/handling,  submission of specimen other than nasopharyngeal swab, presence of viral mutation(s) within the areas targeted by this assay, and inadequate number of viral copies(<138 copies/mL). A negative result must be combined with clinical observations, patient history, and epidemiological information. The expected result is Negative.  Fact Sheet for Patients:  EntrepreneurPulse.com.au  Fact Sheet for Healthcare Providers:  IncredibleEmployment.be  This test is no t yet approved or cleared by the [place] FDA and  has been authorized for detection and/or diagnosis of SARS-CoV-2 by FDA under an Emergency Use Authorization (EUA). This EUA will remain  in effect (meaning this test can be used) for the duration of the COVID-19 declaration under Section 564(b)(1) of the Act, 21 U.S.C.section 360bbb-3(b)(1), unless the authorization is terminated  or revoked sooner.       Influenza A by PCR NEGATIVE NEGATIVE Final   Influenza B by PCR NEGATIVE NEGATIVE Final    Comment: (NOTE) The Xpert Xpress SARS-CoV-2/FLU/RSV plus assay is intended as an aid in the diagnosis of influenza from Nasopharyngeal swab specimens and should not be used as a sole basis for treatment. Nasal washings and aspirates are unacceptable for Xpert Xpress SARS-CoV-2/FLU/RSV testing.  Fact Sheet for Patients: EntrepreneurPulse.com.au  Fact Sheet for Healthcare Providers: IncredibleEmployment.be  This test is not yet approved or cleared by the [place] and has been authorized for  detection and/or diagnosis of SARS-CoV-2 by FDA under an Emergency Use Authorization (EUA). This EUA will remain in effect (meaning this test can be used) for the duration of the COVID-19 declaration under Section 564(b)(1) of the Act, 21 U.S.C. section 360bbb-3(b)(1), unless the authorization is terminated or revoked.     Resp Syncytial Virus by PCR NEGATIVE  NEGATIVE Final    Comment: (NOTE) Fact Sheet for Patients: EntrepreneurPulse.com.au  Fact Sheet for Healthcare Providers: IncredibleEmployment.be  This test is not yet approved or cleared by the [place] FDA and has been authorized for detection and/or diagnosis of SARS-CoV-2 by FDA under an Emergency Use Authorization (EUA). This EUA will remain in effect (meaning this test can be used) for the duration of the COVID-19 declaration under Section 564(b)(1) of the Act, 21 U.S.C. section 360bbb-3(b)(1), unless the authorization is terminated or revoked.  Performed at [facility], [place] [address] [place], [place] [address]   Blood Culture (routine x 2)     Status: None (Preliminary result)   Collection Time: 05/08/20  1:04 PM   Specimen: BLOOD  Result Value Ref Range Status   Specimen Description BLOOD SITE NOT SPECIFIED  Final   Special Requests   Final    BOTTLES DRAWN AEROBIC AND ANAEROBIC Blood Culture adequate volume   Culture   Final    NO GROWTH 3 DAYS Performed at [facility], [address], [place], [place] [address]    Report Status PENDING  Incomplete  MRSA PCR Screening     Status: None   Collection Time: 05/08/20  8:17 PM   Specimen: Nasal Mucosa; Nasopharyngeal  Result Value Ref Range Status   MRSA by PCR NEGATIVE NEGATIVE Final    Comment:        The GeneXpert MRSA Assay (FDA approved for NASAL specimens only), is one component of a comprehensive MRSA colonization surveillance program. It is not intended to diagnose MRSA infection nor to guide or monitor treatment for MRSA infections. Performed at [facility], [place] [address], [place], [place] [address]    Time coordinating discharge: 35 minutes  SIGNED:  Kerney Elbe, DO [facility] 05/11/2020, 6:53 PM Pager is on [place]  If 7PM-7AM, please contact night-coverage [email or web address]

## 2020-05-11 NOTE — Progress Notes (Signed)
SATURATION QUALIFICATIONS:   Patient Saturations on Room Air at Rest = 98%   Patient Saturations on Room Air while Ambulating = 93%   Patient Saturations on 0 Liters of oxygen while Ambulating = 96%   Please briefly explain why patient needs home oxygen:

## 2020-05-11 NOTE — Progress Notes (Signed)
Discharge instructions reviewed with patient. Follow-up appointments and medication changes reviewed. All questions answered. IVs removed. Patient to be escorted home by his wife.   Gailen Shelter RN

## 2020-05-11 NOTE — Progress Notes (Signed)
Mobility Specialist - Progress Note   05/11/20 1514  Mobility  Activity Ambulated in hall  Level of Assistance Standby assist, set-up cues, supervision of patient - no hands on  Assistive Device None  Distance Ambulated (ft) 80 ft  Mobility Response Tolerated well  Mobility performed by Mobility specialist  $Mobility charge 1 Mobility    Pre-mobility: 106 HR, 98% SpO2 During mobility: 114 HR, 93% SpO2 Post-mobility: 106 HR, 96% SpO2  Pt ambulated on RA. Distance limited by pt due to SOB. Pt sitting up on edge of bed after walk.   Pricilla Handler Mobility Specialist Mobility Specialist Phone: [phone]

## 2020-05-12 ENCOUNTER — Other Ambulatory Visit: Payer: Self-pay

## 2020-05-12 ENCOUNTER — Ambulatory Visit: Payer: BC Managed Care – PPO | Admitting: Pulmonary Disease

## 2020-05-12 ENCOUNTER — Encounter: Payer: Self-pay | Admitting: Pulmonary Disease

## 2020-05-12 VITALS — BP 118/72 | HR 120 | Temp 98.2°F | Ht 70.0 in | Wt 166.6 lb

## 2020-05-12 DIAGNOSIS — J189 Pneumonia, unspecified organism: Secondary | ICD-10-CM

## 2020-05-12 DIAGNOSIS — R0602 Shortness of breath: Secondary | ICD-10-CM | POA: Diagnosis not present

## 2020-05-12 DIAGNOSIS — J984 Other disorders of lung: Secondary | ICD-10-CM | POA: Diagnosis not present

## 2020-05-12 DIAGNOSIS — J441 Chronic obstructive pulmonary disease with (acute) exacerbation: Secondary | ICD-10-CM

## 2020-05-12 LAB — EXPECTORATED SPUTUM ASSESSMENT W GRAM STAIN, RFLX TO RESP C

## 2020-05-12 NOTE — Patient Instructions (Addendum)
Continue the Augmentin - let's keep it going unless I decide to change or stop it. Let me know when you need refills  I will follow the cultures to see if a new bacteria grows  Call me first of February and let me know how things are going  I will see you back in 4 weeks with Dr. Silas Flood

## 2020-05-13 LAB — CULTURE, BLOOD (ROUTINE X 2)
Culture: NO GROWTH
Culture: NO GROWTH
Special Requests: ADEQUATE

## 2020-05-14 LAB — CULTURE, RESPIRATORY W GRAM STAIN: Culture: NORMAL

## 2020-05-14 LAB — ACID FAST SMEAR (AFB, MYCOBACTERIA): Acid Fast Smear: NEGATIVE

## 2020-05-16 ENCOUNTER — Other Ambulatory Visit: Payer: Self-pay | Admitting: Family Medicine

## 2020-05-16 NOTE — Progress Notes (Signed)
_0  ID: Richard Li, male    DOB: [birth year], 61 y.o.   MRN: [id number]  Chief Complaint  Patient presents with  . Follow-up    Was diagnosed from the hospital yesterday around 5pm. States so far he has been feeling ok.     Referring provider: [name], DO  HPI:   60 year old man with recently diagnosed right-sided lung cancer 12/2019 via bronchoscopy/EBUS who spent essentially all of November in the hospital for recurrent right-sided pneumonia whom are seeing in hospital follow-up after another admission 04/2020.  Extensive notes from multiple hospitalizations reviewed.  Doing ok. Just discharged. Was feeling weak. BP low. Went to hospital. Got IV abx then changed back to orals. Got some hydration. Resumed prednisone at discharge. Feels more energy, eats better. Doesn't help cough/sputum production.  Worried about the infection. Contemplating what is important if cancer directed therapy is not an option.   HPI at initial visit: Started with productive cough it seems early November.  This is prompted 4 separate hospitalizations and multiple antibiotic courses.  He had recently completed first cycle of carboplatin/paclitaxel.  Was in the midst of radiation therapy to the right upper lobe mass which he completed around Thanksgiving 2021.  He has had cough productive of green sputum.  It is foul tasting.  Smells bad.  Despite multiple rounds of antibiotic is not improved.  His shortness of breath is worsening.  The most recent hospitalization 03/2020 a bronchoscopy was pursued. Mucopurulent secretions noted emanating from RUL. Secretions likely aspirated seen in BI, RLL. Culture with OP flora. No cell counts sent.  Continually coughing, producing sputum as above. Worried because not getting chemotherapy for cancer. Recently completed coures 14 day prednisone for possible radiation pneumonitis. This did not help symptoms. Finishing course of augmentin. Not seen improvement.  Serial  CXRs reviewed which show persistent and worsening R mid and upper lung field opacities. CT 02/2020 reviewed with necrotic appearing mass near RUL takeoff centrally, dense consolidations posterior RUL and throughout R with scattered GGOs in left lung.   Questionaires / Pulmonary Flowsheets:   ACT:  No flowsheet data found.  MMRC: No flowsheet data found.  Epworth:  No flowsheet data found.  Tests:   FENO:  No results found for: NITRICOXIDE  PFT: No flowsheet data found.  WALK:  No flowsheet data found.  Imaging: Personally reviewed and as per EMR and discussion this note  DG Chest 2 View  Result Date: 05/08/2020 CLINICAL DATA:  Shortness of breath. Trach cough. History of lung cancer. EXAM: CHEST - 2 VIEW COMPARISON:  04/17/2020, 04/05/2020 FINDINGS: Irregular cavitary right perihilar mass with adjacent airspace opacity. The degree of adjacent airspace opacity has slightly improved from prior. Improving aeration within the right lower lobe. Increasing patchy interstitial opacities within the left mid to lower lung fields. Small right pleural effusion. No pneumothorax. IMPRESSION: 1. Increasing patchy interstitial opacities within the left mid to lower lung fields, suspicious for multifocal infection. 2. Improving aeration of the right lower lobe. 3. Small right pleural effusion. 4. Persistent cavitary right perihilar mass with adjacent airspace opacity. Degree of airspace opacity has slightly improved. Electronically Signed   By: Davina Poke D.O.   On: 05/08/2020 11:23   CT Chest Wo Contrast  Result Date: 04/17/2020 CLINICAL DATA:  Unresolved pneumonia. History of lung cancer. Cough 3 months and worsening shortness of breath 1 week. EXAM: CT CHEST WITHOUT CONTRAST TECHNIQUE: Multidetector CT imaging of the chest was performed following the standard protocol without IV  contrast. COMPARISON:  03/13/2020 FINDINGS: Cardiovascular: Heart is normal size. There is a small pericardial  effusion. Thoracic aorta is unremarkable. Remaining vascular structures are unchanged. Mediastinum/Nodes: There is cardiomediastinal shift to the right which is more pronounced. 1.6 cm subcarinal lymph node without significant change. Few other shotty mediastinal lymph nodes. No definite hilar adenopathy on this noncontrast exam. Remaining mediastinal structures are unremarkable. 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 There is associated adjacent consolidation with worsening consolidation over the anteromedial right upper lobe/middle lobe. Mild improved patchy airspace opacification with better aeration over the right lower lobe and right middle lobe. Tiny right pleural fluid collection improved. Persistent patchy airspace process throughout the left lung slightly worse overall. Stable discrete 5-6 mm solid nodule over the left lower lobe. No left effusion. Airways unremarkable. Upper Abdomen: No acute findings. Musculoskeletal: Unchanged. IMPRESSION: 1. 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 Stable adjacent consolidation with worsening consolidation over the anteromedial right upper lobe/middle lobe. Improved patchy airspace opacification with better aeration over the right lower lobe and right middle lobe. Tiny right pleural fluid collection improved. 2. Persistent patchy airspace process throughout the left lung slightly worse overall which may be due to ongoing infection. Stable 5-6 mm solid nodule over the left lower lobe as recommend attention on follow-up. Stable 1.6 cm subcarinal lymph node. 3. Small pericardial effusion. Electronically Signed   By: Marin Olp M.D.   On: 04/17/2020 14:02   CT Angio Chest PE W and/or Wo  Contrast  Addendum Date: 05/08/2020   ADDENDUM REPORT: 05/08/2020 15:48 ADDENDUM: These results were called by telephone at the time of interpretation on 05/08/2020 at 3:47 pm to provider Vanetta Mulders, MD is, who verbally acknowledged these results. Electronically Signed   By: Zetta Bills M.D.   On: 05/08/2020 15:48   Result Date: 05/08/2020 CLINICAL DATA:  Shortness of breath EXAM: CT ANGIOGRAPHY CHEST WITH CONTRAST TECHNIQUE: Multidetector CT imaging of the chest was performed using the standard protocol during bolus administration of intravenous contrast. Multiplanar CT image reconstructions and MIPs were obtained to evaluate the vascular anatomy. CONTRAST:  40m OMNIPAQUE IOHEXOL 350 MG/ML SOLN COMPARISON:  April 17, 2020 FINDINGS: Cardiovascular: Normal caliber thoracic aorta. Normal heart size with small to moderate pericardial effusion that is similar to the prior study. Scattered mild aortic atherosclerosis. No sign of pulmonary embolism. Study quality is adequate though mildly limited by bolus timing Mediastinum/Nodes: No thoracic inlet, axillary or LEFT hilar adenopathy. Fullness of soft tissue about the LEFT infrahilar region and subcarinal region with similar appearance to recent imaging. Subcarinal nodal tissue approximately 1.5 cm short axis as compared to 1.6 cm on the prior study. Lungs/Pleura: Cavitary area in the RIGHT upper lobe extending to the RIGHT lung apex from the RIGHT hilum in communication with the RIGHT mainstem bronchus best seen on image 46 of series 7. Small amount of fluid dependently within this location. The cavity measuring 4.9 x 2.7 cm as compared to 5.1 x 3.7 cm. Consolidative changes in the RIGHT upper and RIGHT middle lobes with bronchiectatic changes with similar appearance. Soft tissue/consolidative process in the medial RIGHT lung base in the as ago esophageal recess measuring approximately 3.6 x 2.8 cm is stable compared  to previous imaging. Since the prior study  there is been worsening of ground-glass outside of areas of lung involved with post treatment changes with diffuse ground-glass nodularity throughout the LEFT chest with peripheral predominance and basilar predominance. Small pleural effusions and or mild pleural thickening with slight increase bilaterally. Upper Abdomen: Incidental imaging of upper abdominal contents without acute process. Musculoskeletal: No acute musculoskeletal process. Question of permeative changes in the RIGHT proximal humerus about the humeral head. Not evident on more remote radiographs. Review of the MIP images confirms the above findings. IMPRESSION: 1. No evidence of pulmonary embolism. 2. 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 3. Worsening of potential multifocal infection, correlate with any history of COVID-19 infection given appearance 4. The appearance of multifocal pneumonia could also be related to a large bronchopleural communication, RIGHT mainstem bronchus in communication with cavitary focus in the RIGHT upper lobe and dissemination subsequently of infected and or inflammatory material throughout bronchi in the chest resulting in pneumonitis. 5. Question of permeative changes in the RIGHT proximal humerus about the humeral head. Not evident on more remote radiographs. Correlation with any symptoms in this area suggested. Subtle changes seen at the margin of images from November. Dedicated humeral imaging may be helpful. Primary differential consideration given relative chronicity would be aggressive osteopenia. Correlate with any pain that might indicate infection. Aggressive osteopenia could also be seen with limited use of the RIGHT upper extremity, imply based on arm position. 6. Small to moderate pericardial effusion, similar to the prior study. 7. Small pleural effusions and or mild pleural thickening with slight increase  bilaterally. 8. Aortic atherosclerosis. Call is out to the referring provider to further discuss findings in the above case. Aortic Atherosclerosis (ICD10-I70.0). Electronically Signed: By: Zetta Bills M.D. On: 05/08/2020 15:36   DG CHEST PORT 1 VIEW  Result Date: 05/10/2020 CLINICAL DATA:  Follow-up multifocal infection, history of right lung cancer EXAM: PORTABLE CHEST 1 VIEW COMPARISON:  05/08/2020 chest radiograph. FINDINGS: Stable cardiomediastinal silhouette with normal heart size. No pneumothorax. Volume loss in the right hemithorax with dense patchy right perihilar lung consolidation and distortion, mildly worsened. Mild to moderate patchy opacities throughout the left lung, similar. IMPRESSION: Stable mild to moderate patchy opacities throughout the left lung compatible with multilobar pneumonia. Volume loss in the right hemithorax and dense patchy right perihilar lung consolidation and distortion, mildly worsened, favor predominantly evolving post treatment change. Electronically Signed   By: Ilona Sorrel M.D.   On: 05/10/2020 08:40   DG Humerus Right  Result Date: 05/08/2020 CLINICAL DATA:  Right shoulder pain with limited mobility EXAM: RIGHT HUMERUS - 2+ VIEW COMPARISON:  06/21/2019 FINDINGS: There is no evidence of fracture or other focal bone lesions. Soft tissues are unremarkable. IMPRESSION: Negative. Electronically Signed   By: Donavan Foil M.D.   On: 05/08/2020 16:17    Lab Results: Personally reviewed CBC    Component Value Date/Time   WBC 10.2 05/11/2020 0103   RBC 2.88 (L) 05/11/2020 0103   HGB 7.8 (L) 05/11/2020 0103   HGB 9.0 (L) 04/22/2020 1004   HCT 25.0 (L) 05/11/2020 0103   PLT 461 (H) 05/11/2020 0103   PLT 439 (H) 04/22/2020 1004   MCV 86.8 05/11/2020 0103   MCH 27.1 05/11/2020 0103   MCHC 31.2 05/11/2020 0103   RDW 16.0 (H) 05/11/2020 0103   LYMPHSABS 0.9 05/11/2020 0103   MONOABS 1.0 05/11/2020 0103  EOSABS 0.0 05/11/2020 0103   BASOSABS 0.0  05/11/2020 0103    BMET    Component Value Date/Time   NA 140 05/11/2020 0103   K 3.4 (L) 05/11/2020 0103   CL 105 05/11/2020 0103   CO2 26 05/11/2020 0103   GLUCOSE 83 05/11/2020 0103   BUN 11 05/11/2020 0103   CREATININE 0.55 (L) 05/11/2020 0103   CREATININE 0.68 04/22/2020 1004   CREATININE 1.26 11/06/2019 1132   CALCIUM 8.3 (L) 05/11/2020 0103   GFRNONAA >60 05/11/2020 0103   GFRNONAA >60 04/22/2020 1004   GFRNONAA 62 11/06/2019 1132   GFRAA >60 01/17/2020 0837   GFRAA 72 11/06/2019 1132    BNP No results found for: BNP  ProBNP No results found for: PROBNP  Specialty Problems      Pulmonary Problems   Allergic rhinitis    Qualifier: Diagnosis of  By: Julien Girt CMA, Leigh        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HCC)    Qualifier: Diagnosis of  By: Julien Girt CMA, Leigh        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 Influenza Split 01/25/2012  . Influenza Whole 02/13/2009, 01/25/2010  . Influenza,inj,Quad PF,6+ Mos 03/30/2015, 02/16/2016, 02/27/2017, 01/24/2018, 03/01/2019, 02/10/2020  . Influenza,inj,quad, With Preservative 01/23/2017  . Tdap 12/12/2011    Past Medical History:  Diagnosis Date  . Allergic rhinitis, cause unspecified   . Anxiety state, unspecified   . Asthma    as a child  . Chronic airway obstruction, not elsewhere classified   . Esophageal reflux   . History of kidney stones   . Hypertension   . Lumbago   . lung ca dx'd 12/2019  . Other and unspecified hyperlipidemia   . Other chest pain     Tobacco History: Social History   Tobacco Use   Smoking Status Former Smoker  . Packs/day: 2.00  . Years: 28.00  . Pack years: 56.00  . Types: Cigarettes  . Quit date: 04/25/2005  . Years since quitting: 15.0  Smokeless Tobacco Never Used   Counseling given: Not Answered   Continue to not smoke  Outpatient Encounter Medications as of 05/12/2020  Medication Sig  . acetaminophen (TYLENOL) 500 MG tablet Take 1,000 mg by mouth 2 (two) times daily.  [name] amoxicillin-clavulanate (AUGMENTIN) 875-125 MG tablet Take 1 tablet by mouth 2 (two) times daily for 14 days.  . cyanocobalamin 1000 MCG tablet Take 1 tablet (1,000 mcg total) by mouth daily.  [name] dextromethorphan-guaiFENesin (MUCINEX DM) 30-600 MG 12hr tablet Take 1 tablet by mouth 2 (two) times daily.  . feeding supplement (ENSURE ENLIVE / ENSURE PLUS) LIQD Take 237 mLs by mouth 3 (three) times daily between meals.  . fenofibrate 160 MG tablet Take 1 tablet (160 mg total) by mouth daily.  . ferrous sulfate 325 (65 FE) MG tablet Take 1 tablet (325 mg total) by mouth daily with breakfast.  . folic acid (FOLVITE) 1 MG tablet Take 1 tablet (1 mg total) by mouth daily.  . Ipratropium-Albuterol (COMBIVENT RESPIMAT) 20-100 MCG/ACT AERS respimat Inhale 1-2 puffs into the lungs  every 6 (six) hours as needed for wheezing.  [name] LORazepam (ATIVAN) 1 MG tablet TAKE 1 TABLET BY MOUTH THREE TIMES DAILY AS NEEDED FOR ANXIETY (Patient taking differently: Take 1 mg by mouth See admin instructions. Take one tablet (1 mg) by mouth daily at bedtime, may also take one tablet (1 mg) twice during the day as needed for anxiety)  . Multiple Vitamin (MULTIVITAMIN WITH MINERALS) TABS tablet Take 1 tablet by mouth daily.  . predniSONE (DELTASONE) 10 MG tablet Take 1 tablet (10 mg total) by mouth daily with breakfast.  . prochlorperazine (COMPAZINE) 10 MG tablet TAKE 1 TABLET BY MOUTH EVERY 6 HOURS AS NEEDED FOR NAUSEA FOR VOMITING (Patient taking differently: Take 10 mg by mouth every 6 (six) hours as needed for nausea or  vomiting.)   Facility-Administered Encounter Medications as of 05/12/2020  Medication  . 0.9 %  sodium chloride infusion     Review of Systems n/a  Physical Exam  BP 118/72   Pulse (!) 120   Temp 98.2 F (36.8 C) (Temporal)   Ht _0  (1.778 m)   Wt 166 lb 9.6 oz (75.6 kg)   SpO2 94% Comment: on RA  BMI 23.90 kg/m   Wt Readings from Last 5 Encounters:  05/12/20 166 lb 9.6 oz (75.6 kg)  05/08/20 158 lb 6.4 oz (71.8 kg)  04/30/20 160 lb (72.6 kg)  04/22/20 165 lb 3.2 oz (74.9 kg)  04/04/20 168 lb 11.2 oz (76.5 kg)    BMI Readings from Last 5 Encounters:  05/12/20 23.90 kg/m  05/08/20 22.73 kg/m  04/30/20 22.96 kg/m  04/22/20 23.70 kg/m  04/04/20 24.21 kg/m     Physical Exam General: Ill appearing, in NAD Eyes: EOMI, no icterus Respiratory: Coarse bronchial breath sounds on inspiration throughout the right, left more clear Cardiovascular: Borderline tachycardic, no murmurs Extremities: No edema, warm   Assessment & Plan:   Productive cough, CT/CXR infiltrate: Multiple hospitalizations for the same, most recent last week. BAL with OP flora. Unfortunately no cell counts sent. Given location of tumor and images from bronch, fear obstruction of portion of upper lobe leading to post-obstructive pneumonia and lack of resolution. Foul smelling green purulent sputum. Favor RML and RLL infiltrates to represent aspiration of mucous down bronchial tree. Most recent Timing of symptoms during radiation does not fit with radiation pneumonitis. Productive cough also does not fit.  Continue Augmentin indefintiely. Repeat CT scan recently worsened compared to end 03/2020. Recent sputum culture pending. Do not feel there is a role for BAL given he produces sputum. Discussed case with interventional colleagues, no role or additional interventions. Fear this will not get better. Started conversations regarding care if could not get cancer directed therapy. He was clear he would want  to be comfortable. Will continue to try to treat infection.     Return in about 4 weeks (around 06/09/2020).   Lanier Clam, MD 05/16/2020   This appointment required 45 minutes of patient care (this includes precharting, chart review, review of results, face-to-face care, etc.).

## 2020-05-21 LAB — ACID FAST CULTURE WITH REFLEXED SENSITIVITIES (MYCOBACTERIA): Acid Fast Culture: NEGATIVE

## 2020-05-26 ENCOUNTER — Encounter: Payer: Self-pay | Admitting: Family Medicine

## 2020-05-28 ENCOUNTER — Other Ambulatory Visit: Payer: Self-pay

## 2020-05-28 ENCOUNTER — Ambulatory Visit (INDEPENDENT_AMBULATORY_CARE_PROVIDER_SITE_OTHER): Payer: BC Managed Care – PPO | Admitting: Medical-Surgical

## 2020-05-28 ENCOUNTER — Encounter: Payer: Self-pay | Admitting: Medical-Surgical

## 2020-05-28 VITALS — BP 97/57 | HR 114 | Temp 97.8°F | Ht 70.0 in | Wt 162.0 lb

## 2020-05-28 DIAGNOSIS — R233 Spontaneous ecchymoses: Secondary | ICD-10-CM | POA: Diagnosis not present

## 2020-05-28 LAB — CBC WITH DIFFERENTIAL/PLATELET
Absolute Monocytes: 852 cells/uL (ref 200–950)
Basophils Absolute: 26 cells/uL (ref 0–200)
Basophils Relative: 0.2 %
Eosinophils Absolute: 66 cells/uL (ref 15–500)
Eosinophils Relative: 0.5 %
HCT: 27.5 % — ABNORMAL LOW (ref 38.5–50.0)
Hemoglobin: 8.7 g/dL — ABNORMAL LOW (ref 13.2–17.1)
Lymphs Abs: 655 cells/uL — ABNORMAL LOW (ref 850–3900)
MCH: 25.7 pg — ABNORMAL LOW (ref 27.0–33.0)
MCHC: 31.6 g/dL — ABNORMAL LOW (ref 32.0–36.0)
MCV: 81.1 fL (ref 80.0–100.0)
MPV: 8.4 fL (ref 7.5–12.5)
Monocytes Relative: 6.5 %
Neutro Abs: 11502 cells/uL — ABNORMAL HIGH (ref 1500–7800)
Neutrophils Relative %: 87.8 %
Platelets: 676 10*3/uL — ABNORMAL HIGH (ref 140–400)
RBC: 3.39 10*6/uL — ABNORMAL LOW (ref 4.20–5.80)
RDW: 15.9 % — ABNORMAL HIGH (ref 11.0–15.0)
Total Lymphocyte: 5 %
WBC: 13.1 10*3/uL — ABNORMAL HIGH (ref 3.8–10.8)

## 2020-05-28 NOTE — Progress Notes (Signed)
Subjective:    CC: rash  HPI: Pleasant 60 year old male presenting for evaluation of a rash that started on his left lateral foot and ankle on Tuesday night that has worsened over the last 24-36 hours and now involves both feet, ankles, and posterior calves. The rash started out pruritic but is now painful. He is currently on antibiotics and steroids, neither of which are new. He was admitted in the hospital for sepsis and pneumonia 2-3 weeks ago. Currently undergoing evaluation and treatment for lung cancer. Unable to identify any potential causes for his rash.. No other associated symptoms. Tried Benadryl and steroid cream topically with no improvement in the rash or the discomfort.   I reviewed the past medical history, family history, social history, surgical history, and allergies today and no changes were needed.  Please see the problem list section below in epic for further details.  Past Medical History: Past Medical History:  Diagnosis Date  . Allergic rhinitis, cause unspecified   . Anxiety state, unspecified   . Asthma    as a child  . Chronic airway obstruction, not elsewhere classified   . Esophageal reflux   . History of kidney stones   . Hypertension   . Lumbago   . lung ca dx'd 12/2019  . Other and unspecified hyperlipidemia   . Other chest pain    Past Surgical History: Past Surgical History:  Procedure Laterality Date  . APPENDECTOMY    . BRONCHIAL BIOPSY  01/07/2020   Procedure: BRONCHIAL BIOPSIES;  Surgeon: Collene Gobble, MD;  Location: [facility] ENDOSCOPY;  Service: Pulmonary;;  . BRONCHIAL BIOPSY  04/05/2020   Procedure: BRONCHIAL BIOPSIES;  Surgeon: Rigoberto Noel, MD;  Location: [place];  Service: Cardiopulmonary;;  . BRONCHIAL BRUSHINGS  01/07/2020   Procedure: BRONCHIAL BRUSHINGS;  Surgeon: Collene Gobble, MD;  Location: [facility] ENDOSCOPY;  Service: Pulmonary;;  . BRONCHIAL NEEDLE ASPIRATION BIOPSY  01/07/2020   Procedure: BRONCHIAL NEEDLE ASPIRATION BIOPSIES;   Surgeon: Collene Gobble, MD;  Location: [facility] ENDOSCOPY;  Service: Pulmonary;;  . BRONCHIAL WASHINGS  01/07/2020   Procedure: BRONCHIAL WASHINGS;  Surgeon: Collene Gobble, MD;  Location: [facility] ENDOSCOPY;  Service: Pulmonary;;  . BRONCHIAL WASHINGS  04/05/2020   Procedure: BRONCHIAL WASHINGS;  Surgeon: Rigoberto Noel, MD;  Location: [place];  Service: Cardiopulmonary;;  . COLONOSCOPY  15 years ago  . HEMOSTASIS CONTROL  01/07/2020   Procedure: HEMOSTASIS CONTROL;  Surgeon: Collene Gobble, MD;  Location: [facility] ENDOSCOPY;  Service: Pulmonary;;  cold saline  . NASAL TURBINATE REDUCTION  2002   Dr.Crossley  . VASECTOMY    . VIDEO BRONCHOSCOPY Right 04/05/2020   Procedure: VIDEO BRONCHOSCOPY WITH FLUORO;  Surgeon: Rigoberto Noel, MD;  Location: [place];  Service: Cardiopulmonary;  Laterality: Right;  [name] VIDEO BRONCHOSCOPY WITH ENDOBRONCHIAL NAVIGATION N/A 01/07/2020   Procedure: VIDEO BRONCHOSCOPY WITH ENDOBRONCHIAL NAVIGATION;  Surgeon: Collene Gobble, MD;  Location: [place] ENDOSCOPY;  Service: Pulmonary;  Laterality: N/A;  . VIDEO BRONCHOSCOPY WITH ENDOBRONCHIAL ULTRASOUND N/A 01/07/2020   Procedure: VIDEO BRONCHOSCOPY WITH ENDOBRONCHIAL ULTRASOUND;  Surgeon: Collene Gobble, MD;  Location: [place] ENDOSCOPY;  Service: Pulmonary;  Laterality: N/A;   Social History: Social History   Socioeconomic History  . Marital status: Married    Spouse name: Designer, industrial/product  . Number of children: Not on file  . Years of education: Not on file  . Highest education level: Not on file  Occupational History  . Occupation: truck Education administrator: [employer]  Tobacco Use  . Smoking status: Former Smoker    Packs/day: 2.00    Years: 28.00    Pack years: 56.00    Types: Cigarettes    Quit date: 04/25/2005    Years since quitting: 15.1  . Smokeless tobacco: Never Used  Vaping Use  . Vaping Use: Never used  Substance and Sexual Activity  . Alcohol use: Yes    Alcohol/week: 12.0 standard drinks    Types: 12  Cans of beer per week    Comment: social use  . Drug use: No  . Sexual activity: Not on file  Other Topics Concern  . Not on file  Social History Narrative  . Not on file   Social Determinants of Health   Financial Resource Strain: Not on file  Food Insecurity: Not on file  Transportation Needs: Not on file  Physical Activity: Not on file  Stress: Not on file  Social Connections: Not on file   Family History: Family History  Problem Relation Age of Onset  . Breast cancer Mother   . Cancer Father   . Heart attack Brother   . Emphysema Maternal Uncle   . COPD Maternal Uncle   . Heart disease Maternal Uncle   . Colon cancer Neg Hx    Allergies: No Known Allergies Medications: See med rec.  Review of Systems: See HPI for pertinent positives and negatives.   Objective:    General: Well Developed, well nourished, and in no acute distress.  Neuro: Alert and oriented x3.  HEENT: Normocephalic, atraumatic.  Skin: Warm and dry. Nonblanching maculopapular rash encompassing both feet, ankles, and extending up the posterior calves.  Cardiac: Regular rate and rhythm, no murmurs rubs or gallops, bilateral nonpitting lower extremity edema.  Respiratory: Scattered wheezing to auscultation bilaterally. Not using accessory muscles, speaking in full sentences.  Impression and Recommendations:    1. Petechial rash Checking CBC with differential. Reviewed hospital labs with noted elevated platelets. Unclear etiology in a complicated case. Consulted with Dr. Dianah Field who evaluated patient in the exam room. Recommend taking oral Benadryl 25-50mg  every 6 hours as needed and monitoring symptoms. If blood work looks stable, plan to return for re-evaluation in one week unless symptoms worsen. In that case, return sooner.  - CBC with Differential/Platelet  Return in about 1 week (around 06/04/2020) for rash follow up with PCP . ___________________________________________ Clearnce Sorrel, DNP,  APRN, FNP-BC [facility] [name]

## 2020-05-31 ENCOUNTER — Other Ambulatory Visit: Payer: Self-pay | Admitting: Pulmonary Disease

## 2020-06-01 ENCOUNTER — Telehealth: Payer: Self-pay | Admitting: Pulmonary Disease

## 2020-06-01 ENCOUNTER — Telehealth: Payer: Self-pay

## 2020-06-01 NOTE — Telephone Encounter (Signed)
Pt lvm stating medication given for rash is not working. Rx given by Samuel Bouche. He states the rash actually looks worse. Requesting another Rx.

## 2020-06-01 NOTE — Telephone Encounter (Signed)
He was just told to use Benadryl orally per Dr. Darene Lamer. He is supposed to be coming in to see you on Wednesday but likely needs to be seen first thing tomorrow. Advised him to go to the ED or UC should he develop a worsening of his symptoms.

## 2020-06-01 NOTE — Telephone Encounter (Signed)
ATC karen with recs from Dr. Silas Flood [facility]

## 2020-06-01 NOTE — Telephone Encounter (Signed)
Richard Li wife is returning phone call. Richard Li phone number is [phone].

## 2020-06-01 NOTE — Telephone Encounter (Signed)
Called and spoke with wife who states that patient has developed a rash all overboth feet causing them to be sore & swollen. She states that symptoms started last Tuesday and went and saw PCP Thursday and has been taking Benadryl since. States it was clearing up Saturday and was almost completely gone Sunday morning when he woke up and then later that evening flared up again with more than before and is even on the bottom of his feet this time. He has a big red spot on his left ankle that is painful to touch. Denies any fevers. PCP wanted Dr. Silas Flood to look into medications that he has put patient on to see if any of them are causing reaction.   Dr. Silas Flood please advise

## 2020-06-01 NOTE — Telephone Encounter (Signed)
Forwarding to [place] as well since she was last to see him for this.  May be vasculitis related to ongoing infection.  May want to  consider biopsy if this continues.

## 2020-06-01 NOTE — Telephone Encounter (Signed)
Reviewed call about rash to bilateral feet, pruritic, painful.  Reviewed recent visit from PCP office.  I prescribed Augmentin and prednisone for essentially nonhealing chronic postobstructive pneumonia in the setting of right upper lobe mass.  It would be atypical presentation of antibiotic rash.  Would not think prednisone would cause this.  Advise PCP to review recent labs (I cannot see the results of these labs), if evidence of eosinophilia then this could result present drug eruption with eosinophilia.  If eosinophilia present, could consider stopping Augmentin and choose a different antibiotic.  Unfortunately he needs to be on antibiotics for the foreseeable future given inability to clear pneumonia.  Skin rashes or not my area of expertise, recommend dermatologic consult if worsens at the discretion of PCP.

## 2020-06-01 NOTE — Telephone Encounter (Signed)
Spoke with patient's wife, she verbalized understanding of recommendations. She did state that he was starting to feel better while on the Augmentin. She will hold it until Wednesday or Thursday and then call our office back to let us know how the rash did.   Nothing further needed at time of call.

## 2020-06-02 NOTE — Telephone Encounter (Signed)
Left a message for patient to schedule an appointment, per Joy.

## 2020-06-03 ENCOUNTER — Telehealth: Payer: Self-pay | Admitting: Pulmonary Disease

## 2020-06-03 ENCOUNTER — Ambulatory Visit: Payer: BC Managed Care – PPO | Admitting: Family Medicine

## 2020-06-03 MED ORDER — DOXYCYCLINE HYCLATE 100 MG PO TABS: 100.0000 mg | ORAL_TABLET | Freq: Two times a day (BID) | ORAL | 0 refills | Status: DC

## 2020-06-03 NOTE — Telephone Encounter (Signed)
augmentin stopped 2 days ago , rash is better.  Congestion worse off abx , breathing worse.  Rx Doxycycline x 7 days  Increase pred 20mg  daily for 7 days then 10mg  daily   Ov in 2 days for eval .  Please contact office for sooner follow up if symptoms do not improve or worsen or seek emergency care

## 2020-06-05 ENCOUNTER — Ambulatory Visit: Payer: BC Managed Care – PPO | Admitting: Pulmonary Disease

## 2020-06-05 ENCOUNTER — Other Ambulatory Visit: Payer: Self-pay

## 2020-06-05 ENCOUNTER — Encounter: Payer: Self-pay | Admitting: Pulmonary Disease

## 2020-06-05 VITALS — BP 108/70 | HR 105 | Temp 98.4°F | Ht 70.0 in | Wt 160.0 lb

## 2020-06-05 DIAGNOSIS — C3491 Malignant neoplasm of unspecified part of right bronchus or lung: Secondary | ICD-10-CM | POA: Diagnosis not present

## 2020-06-05 DIAGNOSIS — J189 Pneumonia, unspecified organism: Secondary | ICD-10-CM

## 2020-06-05 MED ORDER — DOXYCYCLINE HYCLATE 100 MG PO TABS: 100.0000 mg | ORAL_TABLET | Freq: Two times a day (BID) | ORAL | 0 refills | Status: DC

## 2020-06-05 MED ORDER — PREDNISONE 10 MG PO TABS: ORAL_TABLET | ORAL | 0 refills | Status: AC

## 2020-06-05 NOTE — Patient Instructions (Signed)
I am glad you are feeling better  Continue the doxycycline and prednisone as previously prescribed  After that, I provided additional doxycycline for 14 days. I can renew this and send refills. I would plan to continue this indefinitely until I speak with Dr. Earlie Server and decide plans for repeat CT scan.  I provided a prednisone taper. We will continue 20 mg an additional week, then 15 mg for a week, then 10 mg (provided additional 4 weeks at this dose).  I will let Dr. Earlie Server take control of the prednisone from there although I am always happy to weigh in and assist and help where I can.  Return to clinic in 6 weeks for follow-up with Dr. Silas Flood (early April 2022)

## 2020-06-07 ENCOUNTER — Encounter: Payer: Self-pay | Admitting: Internal Medicine

## 2020-06-08 ENCOUNTER — Other Ambulatory Visit: Payer: Self-pay | Admitting: Adult Health

## 2020-06-08 NOTE — Telephone Encounter (Signed)
Dr. Silas Flood, please advise if you are okay refilling med or if this is a duplicate request.

## 2020-06-09 NOTE — Progress Notes (Signed)
_0  ID: Richard Li, male    DOB: [birth year], 60 y.o.   MRN: [id number]  Chief Complaint  Patient presents with  . Follow-up    F/U after starting antibiotics. Recently switched to doxy. Stated that his cough has improved but he is still coughing up green phlegm. Still having issues with SOB. Slight pressure in the middle of his chest.     Referring provider: [name], DO  HPI:   60 year old man with recently diagnosed right-sided lung cancer 12/2019 via bronchoscopy/EBUS who spent essentially all of November in the hospital for recurrent right-sided pneumonia whom are seeing in hospital follow-up after another admission 04/2020.  Extensive notes from multiple hospitalizations reviewed.  Patient return to clinic today.  He states he is improved.  He reports that duration of cough particular in the morning has been improving over time.  This was on the Augmentin as previously prescribed.  Coughing up less phlegm, spitting more like 5 minutes the morning clearing out junk as opposed to the normal 15-20.  Had more energy.  Continued on prednisone as prescribed.  Unfortunate, last week, he developed bilateral rash on foot that was described as petechial.  Images reviewed.  Still has some sequela of rash today on exam.  Is associate with pain and itching.  This improved shortly after stopping Augmentin.  However cough was worsening in the interim after a couple of days.  Prescribed doxycycline by Rexene Edison, NP.  Symptoms returned to improved and continue to improve per his report.  HPI at initial visit: Started with productive cough it seems early November.  This is prompted 4 separate hospitalizations and multiple antibiotic courses.  He had recently completed first cycle of carboplatin/paclitaxel.  Was in the midst of radiation therapy to the right upper lobe mass which he completed around Thanksgiving 2021.  He has had cough productive of green sputum.  It is foul tasting.  Smells  bad.  Despite multiple rounds of antibiotic is not improved.  His shortness of breath is worsening.  The most recent hospitalization 03/2020 a bronchoscopy was pursued. Mucopurulent secretions noted emanating from RUL. Secretions likely aspirated seen in BI, RLL. Culture with OP flora. No cell counts sent.  Continually coughing, producing sputum as above. Worried because not getting chemotherapy for cancer. Recently completed coures 14 day prednisone for possible radiation pneumonitis. This did not help symptoms. Finishing course of augmentin. Not seen improvement.  Serial CXRs reviewed which show persistent and worsening R mid and upper lung field opacities. CT 02/2020 reviewed with necrotic appearing mass near RUL takeoff centrally, dense consolidations posterior RUL and throughout R with scattered GGOs in left lung.   Questionaires / Pulmonary Flowsheets:   ACT:  No flowsheet data found.  MMRC: mMRC Dyspnea Scale mMRC Score  06/05/2020 2    Epworth:  No flowsheet data found.  Tests:   FENO:  No results found for: NITRICOXIDE  PFT: No flowsheet data found.  WALK:  No flowsheet data found.  Imaging: Personally reviewed and as per EMR and discussion this note  No results found.  Lab Results: Personally reviewed CBC    Component Value Date/Time   WBC 13.1 (H) 05/28/2020 0000   RBC 3.39 (L) 05/28/2020 0000   HGB 8.7 (L) 05/28/2020 0000   HGB 9.0 (L) 04/22/2020 1004   HCT 27.5 (L) 05/28/2020 0000   PLT 676 (H) 05/28/2020 0000   PLT 439 (H) 04/22/2020 1004   MCV 81.1 05/28/2020 0000   MCH 25.7 (  L) 05/28/2020 0000   MCHC 31.6 (L) 05/28/2020 0000   RDW 15.9 (H) 05/28/2020 0000   LYMPHSABS 655 (L) 05/28/2020 0000   MONOABS 1.0 05/11/2020 0103   EOSABS 66 05/28/2020 0000   BASOSABS 26 05/28/2020 0000    BMET    Component Value Date/Time   NA 140 05/11/2020 0103   K 3.4 (L) 05/11/2020 0103   CL 105 05/11/2020 0103   CO2 26 05/11/2020 0103   GLUCOSE 83 05/11/2020  0103   BUN 11 05/11/2020 0103   CREATININE 0.55 (L) 05/11/2020 0103   CREATININE 0.68 04/22/2020 1004   CREATININE 1.26 11/06/2019 1132   CALCIUM 8.3 (L) 05/11/2020 0103   GFRNONAA >60 05/11/2020 0103   GFRNONAA >60 04/22/2020 1004   GFRNONAA 62 11/06/2019 1132   GFRAA >60 01/17/2020 0837   GFRAA 72 11/06/2019 1132    BNP No results found for: BNP  ProBNP No results found for: PROBNP  Specialty Problems      Pulmonary Problems   Allergic rhinitis    Qualifier: Diagnosis of  By: Julien Girt CMA, Leigh        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HCC)    Qualifier: Diagnosis of  By: Julien Girt CMA, Leigh        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 Amoxicillin Rash    Immunization History  Administered Date(s) Administered  . Influenza Split 01/25/2012  . Influenza Whole 02/13/2009, 01/25/2010  . Influenza,inj,Quad PF,6+ Mos 03/30/2015, 02/16/2016, 02/27/2017, 01/24/2018, 03/01/2019, 02/10/2020  . Influenza,inj,quad, With Preservative 01/23/2017  . Tdap 12/12/2011    Past Medical History:  Diagnosis Date  . Allergic rhinitis, cause unspecified   . Anxiety state, unspecified   . Asthma    as a child  . Chronic airway obstruction, not elsewhere classified   . Esophageal reflux   . History of kidney stones   . Hypertension   . Lumbago   . lung ca dx'd 12/2019  . Other and unspecified hyperlipidemia   . Other chest pain     Tobacco History: Social History   Tobacco Use  Smoking Status Former Smoker  . Packs/day: 2.00  . Years: 28.00  . Pack years: 56.00  . Types: Cigarettes  . Quit date: 04/25/2005  . Years since  quitting: 15.1  Smokeless Tobacco Never Used   Counseling given: Not Answered   Continue to not smoke  Outpatient Encounter Medications as of 06/05/2020  Medication Sig  . acetaminophen (TYLENOL) 500 MG tablet Take 1,000 mg by mouth 2 (two) times daily.  . cyanocobalamin 1000 MCG tablet Take 1 tablet (1,000 mcg total) by mouth daily.  [name] dextromethorphan-guaiFENesin (MUCINEX DM) 30-600 MG 12hr tablet Take 1 tablet by mouth 2 (two) times daily.  [name] doxycycline (VIBRA-TABS) 100 MG tablet Take 1 tablet (100 mg total) by mouth 2 (two) times daily.  . feeding supplement (ENSURE ENLIVE / ENSURE PLUS) LIQD Take 237 mLs by mouth 3 (three) times daily between meals.  . fenofibrate 160 MG tablet Take 1 tablet by mouth once daily  . ferrous sulfate 325 (65 FE) MG tablet Take 1 tablet (325 mg total)  by mouth daily with breakfast.  . folic acid (FOLVITE) 1 MG tablet Take 1 tablet (1 mg total) by mouth daily.  . Ipratropium-Albuterol (COMBIVENT RESPIMAT) 20-100 MCG/ACT AERS respimat Inhale 1-2 puffs into the lungs every 6 (six) hours as needed for wheezing.  [name] LORazepam (ATIVAN) 1 MG tablet TAKE 1 TABLET BY MOUTH THREE TIMES DAILY AS NEEDED FOR ANXIETY (Patient taking differently: Take 1 mg by mouth See admin instructions. Take one tablet (1 mg) by mouth daily at bedtime, may also take one tablet (1 mg) twice during the day as needed for anxiety)  . Multiple Vitamin (MULTIVITAMIN WITH MINERALS) TABS tablet Take 1 tablet by mouth daily.  . predniSONE (DELTASONE) 10 MG tablet Take 1 tablet (10 mg total) by mouth daily with breakfast.  . predniSONE (DELTASONE) 10 MG tablet Take 2 tablets (20 mg total) by mouth daily with breakfast for 7 days, THEN 1.5 tablets (15 mg total) daily with breakfast for 7 days, THEN 1 tablet (10 mg total) daily with breakfast for 28 days.  . prochlorperazine (COMPAZINE) 10 MG tablet TAKE 1 TABLET BY MOUTH EVERY 6 HOURS AS NEEDED FOR NAUSEA FOR VOMITING (Patient taking differently: Take  10 mg by mouth every 6 (six) hours as needed for nausea or vomiting.)  . [DISCONTINUED] doxycycline (VIBRA-TABS) 100 MG tablet Take 1 tablet (100 mg total) by mouth 2 (two) times daily.   Facility-Administered Encounter Medications as of 06/05/2020  Medication  . 0.9 %  sodium chloride infusion     Review of Systems n/a  Physical Exam  BP 108/70   Pulse (!) 105   Temp 98.4 F (36.9 C) (Temporal)   Ht _0  (1.778 m)   Wt 160 lb (72.6 kg)   SpO2 98% Comment: on RA  BMI 22.96 kg/m   Wt Readings from Last 5 Encounters:  06/05/20 160 lb (72.6 kg)  05/28/20 162 lb (73.5 kg)  05/12/20 166 lb 9.6 oz (75.6 kg)  05/08/20 158 lb 6.4 oz (71.8 kg)  04/30/20 160 lb (72.6 kg)    BMI Readings from Last 5 Encounters:  06/05/20 22.96 kg/m  05/28/20 23.24 kg/m  05/12/20 23.90 kg/m  05/08/20 22.73 kg/m  04/30/20 22.96 kg/m     Physical Exam General: Sitting up in exam chair, in NAD Eyes: EOMI, no icterus Respiratory: Coarse bronchial breath sounds on inspiration throughout the right particularly mid to upper lung fields, left clear, both improved from prior Cardiovascular: Borderline tachycardic, no murmurs Extremities: No edema, warm   Assessment & Plan:   Productive cough, CT/CXR infiltrate: Multiple hospitalizations for the same. BAL with OP flora. Unfortunately no cell counts sent. Repeat Sputum culture with same. Given location of tumor and images from bronch, fear obstruction of portion of upper lobe leading to post-obstructive pneumonia and lack of resolution. Foul smelling green purulent sputum. Favor RML and RLL infiltrates to represent aspiration of mucous down bronchial tree. Most recent Timing of symptoms during radiation does not fit with radiation pneumonitis. Productive cough also does not fit.  Cough a bit improving and symptoms have been improving but developed bilateral petechial rash that blanched on feet with pain and itching.  Symptoms improved with Augmentin  was stopped.  Unfortunately cough worsened shortly thereafter.  Placed on doxycycline with continued improvement of symptoms.  We will continue doxycycline indefinitely.  Prednisone taper provided to ended 10 mg daily to continue indefinitely.  Encouraged by his improvement overall.  He looks stronger, cough improving but still present.  We will  coordinate timing of repeat CT scan with oncologist, tentative plan to continue antibiotics until that time.  Discussed care with oncologist and plan for repeat CT scan in of March.  Defer additional prednisone therapy, dose, duration, necessity to oncologist in the future.   Return in about 6 weeks (around 07/17/2020).   Lanier Clam, MD 06/09/2020   This appointment required 32 minutes of patient care (this includes precharting, chart review, review of results, face-to-face care, etc.).

## 2020-06-09 NOTE — Telephone Encounter (Signed)
Please fill doxy - he is to continue on doxycycline after current course finishes. Thanks!

## 2020-06-12 ENCOUNTER — Ambulatory Visit: Payer: BC Managed Care – PPO | Admitting: Pulmonary Disease

## 2020-06-12 LAB — FUNGUS CULTURE WITH STAIN

## 2020-06-12 LAB — FUNGUS CULTURE RESULT

## 2020-06-12 LAB — FUNGAL ORGANISM REFLEX

## 2020-06-15 ENCOUNTER — Telehealth: Payer: Self-pay | Admitting: Medical Oncology

## 2020-06-15 NOTE — Telephone Encounter (Addendum)
Disability forms received and I recommended wife to send to [facility] pulmonary provider since [facility] is not treating him. She will send to pulmonary/

## 2020-06-16 ENCOUNTER — Telehealth: Payer: Self-pay | Admitting: Pulmonary Disease

## 2020-06-17 DIAGNOSIS — Z0289 Encounter for other administrative examinations: Secondary | ICD-10-CM

## 2020-06-17 NOTE — Telephone Encounter (Signed)
Forms did not need a physician signature. Sent email to Richard Li to drop charge for forms -pr

## 2020-06-18 NOTE — Telephone Encounter (Signed)
Charge dropped, called and spoke to patient's wife, pd $29 fee via phone. Faxed forms to [employer]  at [phone]

## 2020-06-20 ENCOUNTER — Other Ambulatory Visit: Payer: Self-pay | Admitting: Family Medicine

## 2020-06-22 ENCOUNTER — Ambulatory Visit: Payer: BC Managed Care – PPO | Admitting: Pulmonary Disease

## 2020-06-24 ENCOUNTER — Other Ambulatory Visit: Payer: Self-pay

## 2020-06-24 ENCOUNTER — Ambulatory Visit (INDEPENDENT_AMBULATORY_CARE_PROVIDER_SITE_OTHER): Payer: BC Managed Care – PPO | Admitting: Osteopathic Medicine

## 2020-06-24 ENCOUNTER — Ambulatory Visit (INDEPENDENT_AMBULATORY_CARE_PROVIDER_SITE_OTHER): Payer: BC Managed Care – PPO

## 2020-06-24 ENCOUNTER — Encounter: Payer: Self-pay | Admitting: Osteopathic Medicine

## 2020-06-24 VITALS — BP 126/71 | HR 97 | Temp 98.0°F | Wt 164.1 lb

## 2020-06-24 DIAGNOSIS — N2889 Other specified disorders of kidney and ureter: Secondary | ICD-10-CM | POA: Diagnosis not present

## 2020-06-24 DIAGNOSIS — R310 Gross hematuria: Secondary | ICD-10-CM | POA: Diagnosis not present

## 2020-06-24 DIAGNOSIS — C3491 Malignant neoplasm of unspecified part of right bronchus or lung: Secondary | ICD-10-CM

## 2020-06-24 LAB — POCT URINALYSIS DIP (CLINITEK)
Bilirubin, UA: NEGATIVE
Glucose, UA: NEGATIVE mg/dL
Ketones, POC UA: NEGATIVE mg/dL
Nitrite, UA: NEGATIVE
POC PROTEIN,UA: >=300 — AB
Spec Grav, UA: 1.015 (ref 1.010–1.025)
Urobilinogen, UA: 0.2 E.U./dL
pH, UA: 7 (ref 5.0–8.0)

## 2020-06-24 IMAGING — CT CT ABD-PELV W/ CM
2 of 5 series · 15 of 46 positions shown, 17 images · IV contrast (APPLIED)
Comparison: PET-CT on [DATE]

CLINICAL DATA: Gross hematuria.  Right lung carcinoma.

EXAM:
CT ABDOMEN AND PELVIS WITH CONTRAST
TECHNIQUE: Multidetector CT imaging of the abdomen and pelvis was performed
using the standard protocol following bolus administration of
intravenous contrast.
CONTRAST:  100mL OMNIPAQUE IOHEXOL 300 MG/ML  SOLN

[Series 2: axial st · axial · 0.72mm/px · z∈[-550,-135]mm · 12 of 97 slices shown, 14 images]
[im 7/97  soft-tissue]
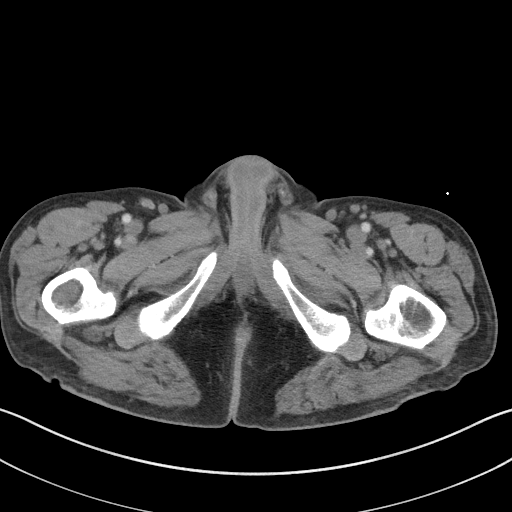
[im 7/97  bone]
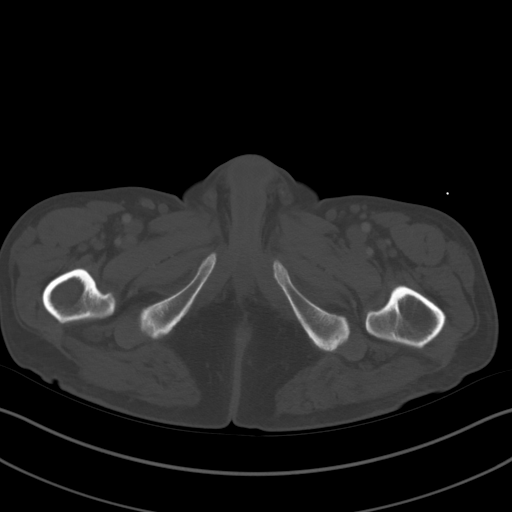
[im 14/97  soft-tissue]
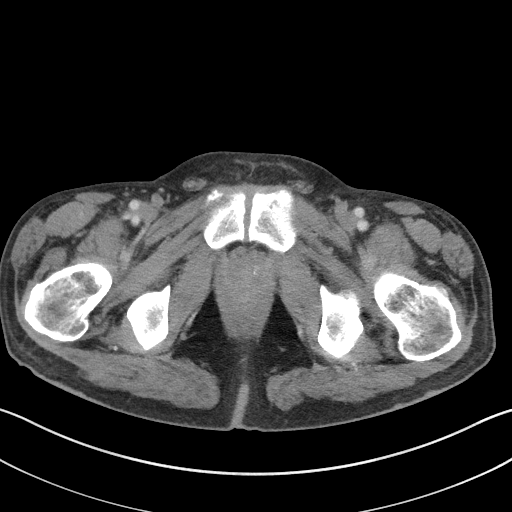
[im 21/97  soft-tissue]
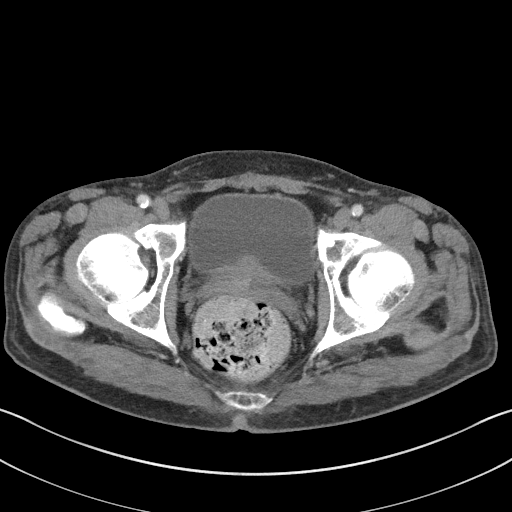
[im 28/97  soft-tissue]
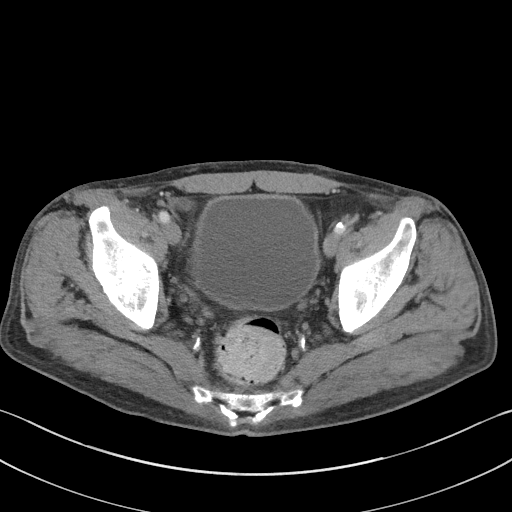
[im 35/97  soft-tissue]
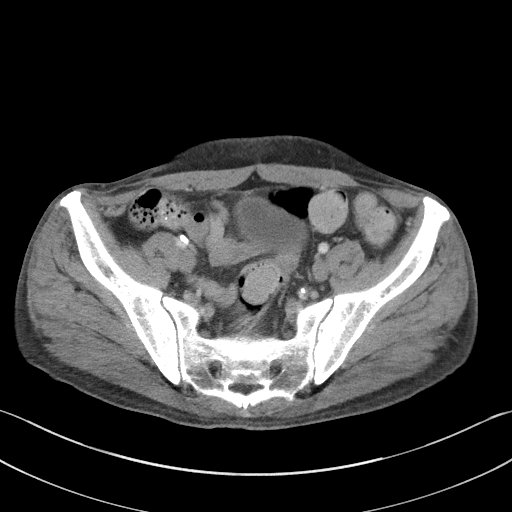
[im 42/97  soft-tissue]
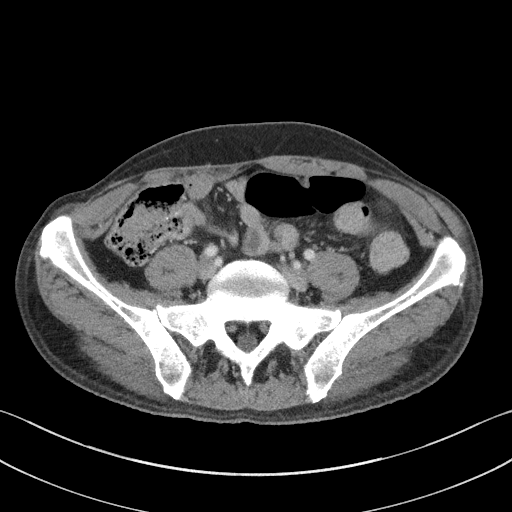
[im 55/97  soft-tissue]
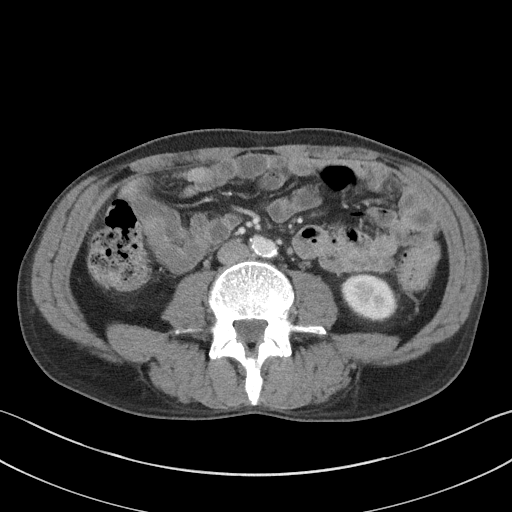
[im 62/97  soft-tissue]
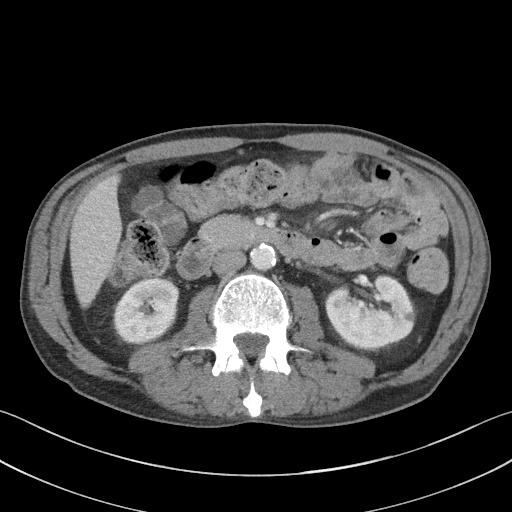
[im 69/97  soft-tissue]
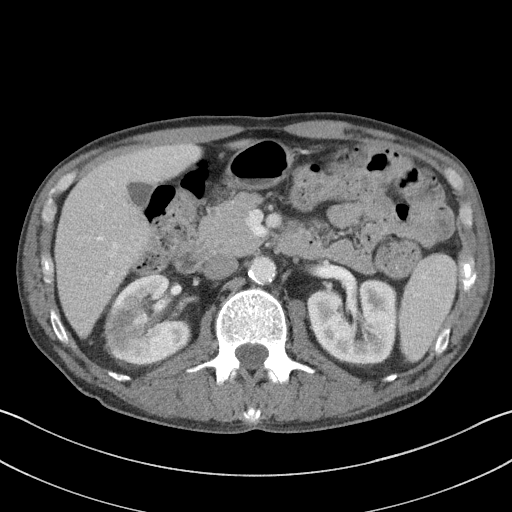
[im 69/97  bone]
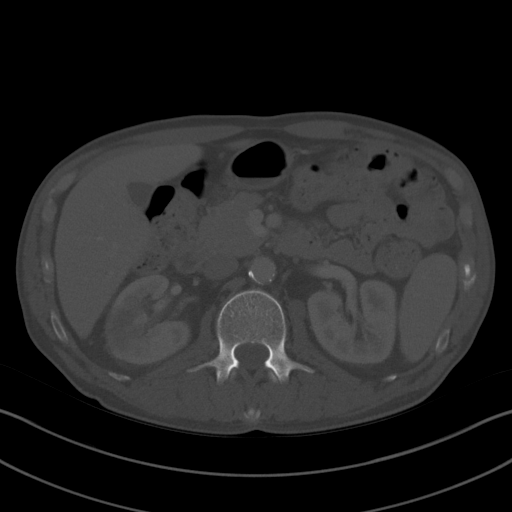
[im 76/97  soft-tissue]
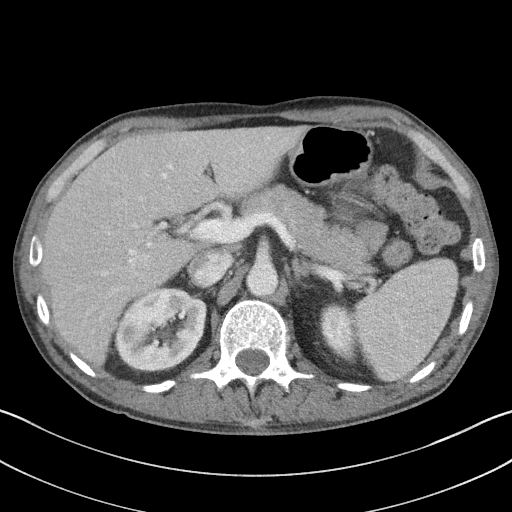
[im 83/97  soft-tissue]
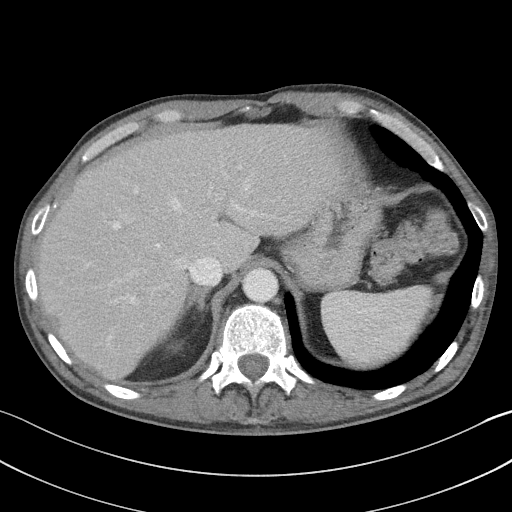
[im 90/97  soft-tissue]
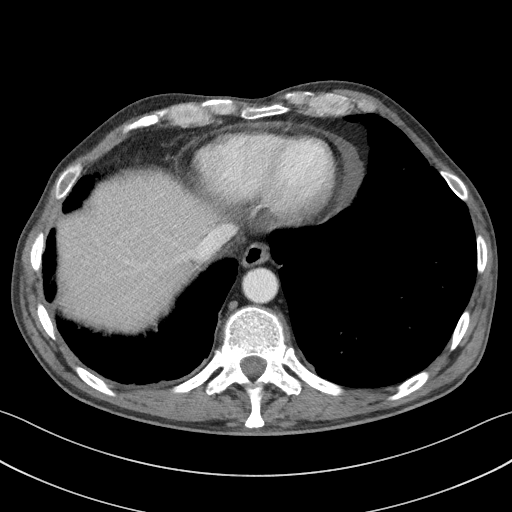

[Series 5: coronal st · coronal · 0.68mm/px · 3 of 84 slices shown]
[im 28/84  soft-tissue]
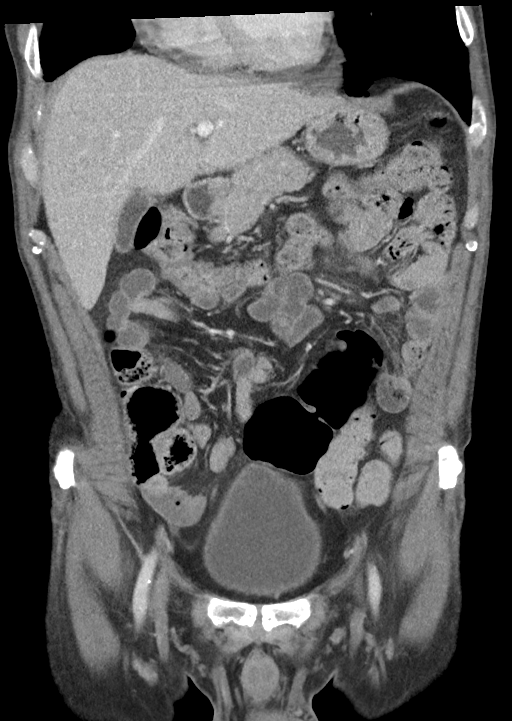
[im 37/84  soft-tissue]
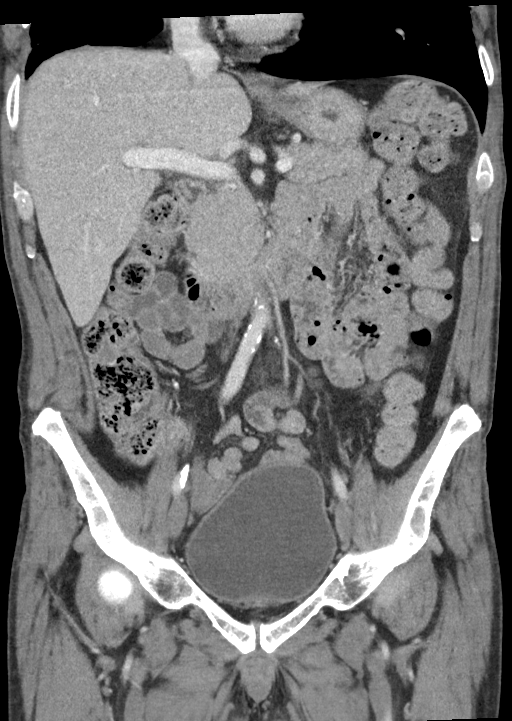
[im 47/84  soft-tissue]
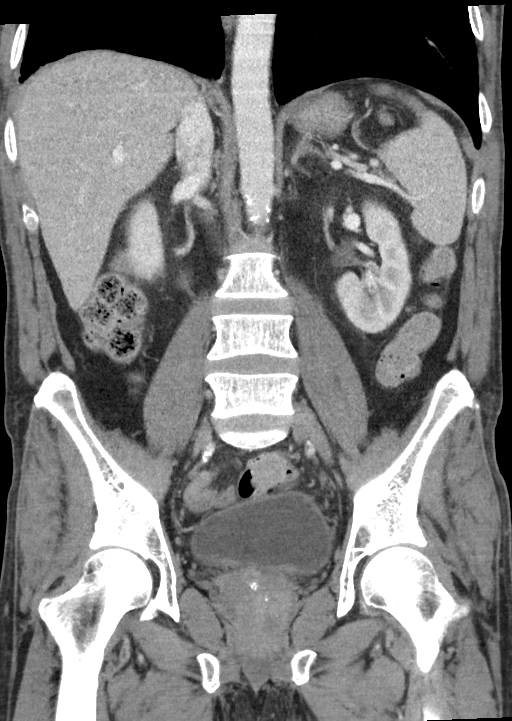

[15 of 46 positions shown; findings below may reference images not displayed]

FINDINGS: Lower Chest: No acute findings.

Hepatobiliary: No hepatic masses identified. Gallbladder is
unremarkable. No evidence of biliary ductal dilatation.

Pancreas:  No mass or inflammatory changes.

Spleen: Within normal limits in size and appearance.

Adrenals/Urinary Tract: Normal adrenal glands lesion with decreased
attenuation is seen in the midpole of the right kidney which
measures 3.8 x 3.3 cm on image [DATE]. This appears new since prior
study approximately 6 months ago. Differential diagnosis includes
pyelonephritis, renal cell carcinoma, and renal metastasis. No
evidence of ureteral calculi or hydronephrosis. Unremarkable
unopacified urinary bladder.

Stomach/Bowel: No evidence of obstruction, inflammatory process or
abnormal fluid collections.

Vascular/Lymphatic: No pathologically enlarged lymph nodes. No
evidence of renal vein or IVC thrombus. Aortic atherosclerotic
calcification noted. No abdominal aortic aneurysm.

Reproductive:  No mass or other significant abnormality.

Other:  None.

Musculoskeletal:  No suspicious bone lesions identified.
IMPRESSION: New 3.8 cm poorly defined low-attenuation lesion in midpole of right
kidney. Differential diagnosis including pyelonephritis, renal cell
carcinoma, and renal metastasis. Suggest correlation with
urinalysis, and consider abdomen MRI without and with contrast for
further characterization.

No other masses or lymphadenopathy within the abdomen or pelvis.

Aortic Atherosclerosis ([81]-[81]).

## 2020-06-24 MED ORDER — IOHEXOL 300 MG/ML  SOLN
100.0000 mL | Freq: Once | INTRAMUSCULAR | Status: AC | PRN
  Administered 2020-06-24: 100 mL via INTRAVENOUS

## 2020-06-24 NOTE — Progress Notes (Signed)
Richard Li is a 60 y.o. male who presents to  [place] at [facility]  today, 06/24/20, seeking care for the following:  Gross hematuria   Context: lung CA, recent PNA d/t obstructive mass. Anemia, last Hgb 05/28/20 (3 weeks ago) was 8.7. PET 12/2019 findings c/w known lung CA, otherwise in abdomen "3. No hypermetabolic thoracic adenopathy or extrathoracic or skeletal metastatic disease. 4. Nonspecific nonfocal heterogeneous prostate hypermetabolism. Suggest correlation with serum PSA." Last PSA 0.5 was 02/2019  Onset this AM, dark red urine, later went and it was amber, this void in office as quite red. Nonpainful. No N/V, no new/worse dizziness, no rash. No increased frequency/dysuria. No fever.        ASSESSMENT & PLAN with other pertinent findings:  The primary encounter diagnosis was Gross hematuria. A diagnosis of Adenocarcinoma of right lung, stage 2 (HCC) was also pertinent to this visit.   Ddx: UTi seems unlikely given on other symptoms, prostate CA shouldn't cause this unless erosion through into urinary tract, uncertain probability but metastatic disease certainly possible, uncertain to what extent nephrotoxicity of recent chemotherapy might be playing a role here. Pt also already anemic, depending on Hgb may need referral for transfusion. CT and labs today to evaluate. Suspect either way will need nephrology referral id urinary tract or renal or prostate pathology is found, and if no etiology obvious form w/u will probably need cystoscopy.   Patient Instructions  Not sure what might be the cause We are looking for potential infection, prostate problem, kidney problem, worst case would be a cancer problem.  I should have most results back by tomorrow or the day after. We will call you / message you with updates.      Orders Placed This Encounter  Procedures  . Urine Culture  . CT ABDOMEN PELVIS W CONTRAST  . CBC with  Differential/Platelet  . COMPLETE METABOLIC PANEL WITH GFR  . TSH  . PSA, Total with Reflex to PSA, Free  . Urinalysis, Routine w reflex microscopic  . Ambulatory referral to Urology  . POCT URINALYSIS DIP (CLINITEK)    No orders of the defined types were placed in this encounter.   CT ABDOMEN PELVIS W CONTRAST  Result Date: 06/24/2020 CLINICAL DATA:  Gross hematuria.  Right lung carcinoma. EXAM: CT ABDOMEN AND PELVIS WITH CONTRAST TECHNIQUE: Multidetector CT imaging of the abdomen and pelvis was performed using the standard protocol following bolus administration of intravenous contrast. CONTRAST:  122mL OMNIPAQUE IOHEXOL 300 MG/ML  SOLN COMPARISON:  PET-CT on 01/16/2020 FINDINGS: Lower Chest: No acute findings. Hepatobiliary: No hepatic masses identified. Gallbladder is unremarkable. No evidence of biliary ductal dilatation. Pancreas:  No mass or inflammatory changes. Spleen: Within normal limits in size and appearance. Adrenals/Urinary Tract: Normal adrenal glands lesion with decreased attenuation is seen in the midpole of the right kidney which measures 3.8 x 3.3 cm on image 15/7. This appears new since prior study approximately 6 months ago. Differential diagnosis includes pyelonephritis, renal cell carcinoma, and renal metastasis. No evidence of ureteral calculi or hydronephrosis. Unremarkable unopacified urinary bladder. Stomach/Bowel: No evidence of obstruction, inflammatory process or abnormal fluid collections. Vascular/Lymphatic: No pathologically enlarged lymph nodes. No evidence of renal vein or IVC thrombus. Aortic atherosclerotic calcification noted. No abdominal aortic aneurysm. Reproductive:  No mass or other significant abnormality. Other:  None. Musculoskeletal:  No suspicious bone lesions identified. IMPRESSION: New 3.8 cm poorly defined low-attenuation lesion in midpole of right kidney. Differential diagnosis  including pyelonephritis, renal cell carcinoma, and renal metastasis.  Suggest correlation with urinalysis, and consider abdomen MRI without and with contrast for further characterization. No other masses or lymphadenopathy within the abdomen or pelvis. Aortic Atherosclerosis (ICD10-I70.0). Electronically Signed   By: Marlaine Hind M.D.   On: 06/24/2020 17:06    See below for relevant physical exam findings  See below for recent lab and imaging results reviewed  Medications, allergies, PMH, PSH, SocH, FamH reviewed below    Follow-up instructions: Return for RECHECK PENDING RESULTS / IF WORSE OR CHANGE.                                        Exam:  BP 126/71 (BP Location: Left Arm, Patient Position: Sitting, Cuff Size: Normal)   Pulse 97   Temp 98 F (36.7 C) (Oral)   Wt 164 lb 1.9 oz (74.4 kg)   BMI 23.55 kg/m   Constitutional: VS see above. General Appearance: alert, well-developed, well-nourished, NAD  Neck: No masses, trachea midline.   Respiratory: Normal respiratory effort. no wheeze, no rhonchi, no rales  Cardiovascular: S1/S2 normal, no murmur, no rub/gallop auscultated. RRR.   Musculoskeletal: Gait normal. Symmetric and independent movement of all extremities. No CVA tenderness   Abdominal: non-tender, non-distended, no appreciable organomegaly, neg Murphy's, BS WNLx4  Neurological: Normal balance/coordination. No tremor.  Skin: warm, dry, intact.   Psychiatric: Normal judgment/insight. Normal mood and affect. Oriented x3.   Current Meds  Medication Sig  . acetaminophen (TYLENOL) 500 MG tablet Take 1,000 mg by mouth 2 (two) times daily.  [name] dextromethorphan-guaiFENesin (MUCINEX DM) 30-600 MG 12hr tablet Take 1 tablet by mouth 2 (two) times daily.  [name] doxycycline (VIBRA-TABS) 100 MG tablet Take 1 tablet (100 mg total) by mouth 2 (two) times daily.  [name] doxycycline (VIBRA-TABS) 100 MG tablet Take 1 tablet by mouth twice daily  . feeding supplement (ENSURE ENLIVE / ENSURE PLUS) LIQD Take 237 mLs by mouth  3 (three) times daily between meals.  . fenofibrate 160 MG tablet Take 1 tablet by mouth once daily  . ferrous sulfate 325 (65 FE) MG tablet Take 1 tablet (325 mg total) by mouth daily with breakfast.  . folic acid (FOLVITE) 1 MG tablet Take 1 tablet (1 mg total) by mouth daily.  . Ipratropium-Albuterol (COMBIVENT RESPIMAT) 20-100 MCG/ACT AERS respimat Inhale 1-2 puffs into the lungs every 6 (six) hours as needed for wheezing.  [name] LORazepam (ATIVAN) 1 MG tablet TAKE 1 TABLET BY MOUTH THREE TIMES DAILY AS NEEDED FOR ANXIETY (Patient taking differently: Take 1 mg by mouth See admin instructions. Take one tablet (1 mg) by mouth daily at bedtime, may also take one tablet (1 mg) twice during the day as needed for anxiety)  . meclizine (ANTIVERT) 25 MG tablet Take 1 tablet (25 mg total) by mouth every 6 (six) hours as needed for dizziness.  . Multiple Vitamin (MULTIVITAMIN WITH MINERALS) TABS tablet Take 1 tablet by mouth daily.  . predniSONE (DELTASONE) 10 MG tablet Take 1 tablet (10 mg total) by mouth daily with breakfast.  . predniSONE (DELTASONE) 10 MG tablet Take 2 tablets (20 mg total) by mouth daily with breakfast for 7 days, THEN 1.5 tablets (15 mg total) daily with breakfast for 7 days, THEN 1 tablet (10 mg total) daily with breakfast for 28 days.  . prochlorperazine (COMPAZINE) 10 MG tablet TAKE 1 TABLET BY MOUTH  EVERY 6 HOURS AS NEEDED FOR NAUSEA FOR VOMITING (Patient taking differently: Take 10 mg by mouth every 6 (six) hours as needed for nausea or vomiting.)    Allergies  Allergen Reactions  . Amoxicillin Rash    Patient Active Problem List   Diagnosis Date Noted  . Multifocal pneumonia   . Iron deficiency anemia   . Leukocytosis   . Hypotension   . Recurrent pneumonia 05/03/2020  . Protein-calorie malnutrition, severe 04/07/2020  . Sepsis due to pneumonia ([place]) 04/04/2020  . Symptomatic anemia 03/20/2020  . Volume overload 03/20/2020  . Sepsis ([place]) 03/13/2020  . SOB (shortness  of breath) 03/06/2020  . COPD with acute exacerbation ([place]) 03/06/2020  . AKI (acute kidney injury) ([place]) 03/06/2020  . Dehydration 03/05/2020  . PNA (pneumonia) 03/05/2020  . Encounter for antineoplastic chemotherapy 01/30/2020  . Sinus congestion 01/22/2020  . Adenocarcinoma of right lung, stage 2 ([place]) 01/17/2020  . Pulmonary nodules/lesions, multiple 01/07/2020  . 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 12/21/2019  . Thoracic aortic atherosclerosis ([place]) 12/21/2019  . Erectile dysfunction 11/06/2019  . Fatigue 10/16/2019  . Insomnia 09/10/2019  . BPPV (benign paroxysmal positional vertigo) 08/03/2017  . Physical exam, annual 03/30/2015  . Essential hypertension 06/03/2014  . Kidney stone 12/25/2012  . GERD 01/27/2010  . Hyperlipidemia 12/11/2009  . GAD (generalized anxiety disorder) 11/17/2007  . Anxiety state 11/17/2007  . Allergic rhinitis 02/26/2007  . 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 ABNORMAL CHEST XRAY 02/26/2007    Family History  Problem Relation Age of Onset  . Breast cancer Mother   . Cancer Father   . Heart attack Brother   . Emphysema Maternal Uncle   . COPD Maternal Uncle   . Heart disease Maternal Uncle   . Colon cancer Neg Hx     Social History   Tobacco Use  Smoking Status Former Smoker  . Packs/day: 2.00  . Years: 28.00  . Pack years: 56.00  . Types: Cigarettes  . Quit date: 04/25/2005  . Years since quitting: 15.1  Smokeless Tobacco Never Used    Past Surgical History:  Procedure Laterality Date  . APPENDECTOMY    . BRONCHIAL BIOPSY  01/07/2020   Procedure: BRONCHIAL BIOPSIES;  Surgeon: Collene Gobble, MD;  Location[facility] END[facility]COPY;  Service: Pulmonary;;  . BRONCHIAL BIOPSY  04/05/2020   Procedure: BRONCHIAL BIOPSIES;  Surgeon: Rigoberto Noel, MD;  Location: [place];  Service: Cardiopulmonary;;  . BRONCHIAL BRUSHINGS  01/07/2020   Procedure: BRONCHIAL BRUSHINGS;  Surgeon: Collene Gobble, MD;  Location: [facility] ENDOSCOPY;  Service: Pulmonary;;  . BRONCHIAL NEEDLE ASPIRATION BIOPSY  01/07/2020   Procedure: BRONCHIAL NEEDLE ASPIRATION BIOPSIES;  Surgeon: Collene Gobble, MD;  Location: [facility] ENDOSCOPY;  Service: Pulmonary;;  . BRONCHIAL WASHINGS  01/07/2020   Procedure: BRONCHIAL WASHINGS;  Surgeon: Collene Gobble, MD;  Location: [facility] ENDOSCOPY;  Service: Pulmonary;;  . BRONCHIAL WASHINGS  04/05/2020   Procedure: BRONCHIAL WASHINGS;  Surgeon: Rigoberto Noel, MD;  Location: [place];  Service: Cardiopulmonary;;  . COLONOSCOPY  15 years ago  . HEMOSTASIS CONTROL  01/07/2020   Procedure: HEMOSTASIS CONTROL;  Surgeon: Collene Gobble, MD;  Location: [facility] ENDOSCOPY;  Service: Pulmonary;;  cold saline  . NASAL TURBINATE REDUCTION  2002   Dr.Crossley  . VASECTOMY    . VIDEO BRONCHOSCOPY Right 04/05/2020   Procedure: VIDEO BRONCHOSCOPY WITH FLUORO;  Surgeon: Rigoberto Noel, MD;  Location: [place];  Service:  Cardiopulmonary;  Laterality: Right;  [name] VIDEO BRONCHOSCOPY WITH ENDOBRONCHIAL NAVIGATION N/A 01/07/2020   Procedure: VIDEO BRONCHOSCOPY WITH ENDOBRONCHIAL NAVIGATION;  Surgeon: Collene Gobble, MD;  Location: [place] ENDOSCOPY;  Service: Pulmonary;  Laterality: N/A;  . VIDEO BRONCHOSCOPY WITH ENDOBRONCHIAL ULTRASOUND N/A 01/07/2020   Procedure: VIDEO BRONCHOSCOPY WITH ENDOBRONCHIAL ULTRASOUND;  Surgeon: Collene Gobble, MD;  Location: [place] ENDOSCOPY;  Service: Pulmonary;  Laterality: N/A;    Immunization History  Administered Date(s) Administered  . Influenza Split 01/25/2012  . Influenza Whole 02/13/2009, 01/25/2010  . Influenza,inj,Quad PF,6+ Mos 03/30/2015, 02/16/2016, 02/27/2017, 01/24/2018, 03/01/2019, 02/10/2020  . Influenza,inj,quad, With Preservative 01/23/2017  . Tdap 12/12/2011    Recent Results (from the past 2160 hour(s))  Basic metabolic panel     Status: Abnormal   Collection Time: 04/04/20 10:40 AM  Result Value Ref Range   Sodium 134 (L) 135 - 145 mmol/L    Potassium 3.7 3.5 - 5.1 mmol/L   Chloride 96 (L) 98 - 111 mmol/L   CO2 25 22 - 32 mmol/L   Glucose, Bld 133 (H) 70 - 99 mg/dL    Comment: Glucose reference range applies only to samples taken after fasting for at least 8 hours.   BUN 19 6 - 20 mg/dL   Creatinine, Ser 1.16 0.61 - 1.24 mg/dL   Calcium 9.3 8.9 - 10.3 mg/dL   GFR, Estimated >60 >60 mL/min    Comment: (NOTE) Calculated using the CKD-EPI Creatinine Equation (2021)    Anion gap 13 5 - 15    Comment: Performed at [facility] [address] [place], [place] [address]  CBC     Status: Abnormal   Collection Time: 04/04/20 10:40 AM  Result Value Ref Range   WBC 17.1 (H) 4.0 - 10.5 K/uL   RBC 3.22 (L) 4.22 - 5.81 MIL/uL   Hemoglobin 9.5 (L) 13.0 - 17.0 g/dL   HCT 28.8 (L) 39.0 - 52.0 %   MCV 89.4 80.0 - 100.0 fL   MCH 29.5 26.0 - 34.0 pg   MCHC 33.0 30.0 - 36.0 g/dL   RDW 13.6 11.5 - 15.5 %   Platelets 853 (H) 150 - 400 K/uL   nRBC 0.0 0.0 - 0.2 %    Comment: Performed at [facility], [place] [address] [place], [place] [address]  Troponin I (High Sensitivity)     Status: None   Collection Time: 04/04/20 10:40 AM  Result Value Ref Range   Troponin I (High Sensitivity) 6 <18 ng/L    Comment: (NOTE) Elevated high sensitivity troponin I (hsTnI) values and significant  changes across serial measurements may suggest ACS but many other  chronic and acute conditions are known to elevate hsTnI results.  Refer to the "Links" section for chest pain algorithms and additional  guidance. Performed at [facility], [place] [address], [place], [place] [address]   Lactic acid, plasma     Status: Abnormal   Collection Time: 04/04/20 10:41 AM  Result Value Ref Range   Lactic Acid, Venous 3.2 (HH) 0.5 - 1.9 mmol/L    Comment: CRITICAL RESULT CALLED TO, READ BACK BY AND VERIFIED WITH: PETERS,M RN @ 1146 04/04/20 LEONARD,A Performed at [facility], [place] [address], [place], [place] [address]   Blood Culture  (routine x 2)     Status: None   Collection Time: 04/04/20 10:56 AM   Specimen: BLOOD  Result Value Ref Range   Specimen Description BLOOD BLOOD RIGHT HAND  Special Requests      BOTTLES DRAWN AEROBIC AND ANAEROBIC Blood Culture adequate volume   Culture      NO GROWTH 6 DAYS Performed at [facility], [place] [address], [place], [place] [address]    Report Status 04/10/2020 FINAL   Blood Culture (routine x 2)     Status: None   Collection Time: 04/04/20 11:12 AM   Specimen: BLOOD  Result Value Ref Range   Specimen Description BLOOD RIGHT ANTECUBITAL    Special Requests      BOTTLES DRAWN AEROBIC AND ANAEROBIC Blood Culture adequate volume   Culture      NO GROWTH 6 DAYS Performed at [facility], [place] [address], [place], [place] [address]    Report Status 04/10/2020 FINAL   Resp Panel by RT-PCR (Flu A&B, Covid) Nasopharyngeal Swab     Status: None   Collection Time: 04/04/20 11:13 AM   Specimen: Nasopharyngeal Swab; Nasopharyngeal(NP) swabs in vial transport medium  Result Value Ref Range   SARS Coronavirus 2 by RT PCR NEGATIVE NEGATIVE    Comment: (NOTE) SARS-CoV-2 target nucleic acids are NOT DETECTED.  The SARS-CoV-2 RNA is generally detectable in upper respiratory specimens during the acute phase of infection. The lowest concentration of SARS-CoV-2 viral copies this assay can detect is 138 copies/mL. A negative result does not preclude SARS-Cov-2 infection and should not be used as the sole basis for treatment or other patient management decisions. A negative result may occur with  improper specimen collection/handling, submission of specimen other than nasopharyngeal swab, presence of viral mutation(s) within the areas targeted by this assay, and inadequate number of viral copies(<138 copies/mL). A negative result must be combined with clinical observations, patient history, and epidemiological information. The expected result is Negative.  Fact Sheet for  Patients:  EntrepreneurPulse.com.au  Fact Sheet for Healthcare Providers:  IncredibleEmployment.be  This test is no t yet approved or cleared by the [place] FDA and  has been authorized for detection and/or diagnosis of SARS-CoV-2 by FDA under an Emergency Use Authorization (EUA). This EUA will remain  in effect (meaning this test can be used) for the duration of the COVID-19 declaration under Section 564(b)(1) of the Act, 21 U.S.C.section 360bbb-3(b)(1), unless the authorization is terminated  or revoked sooner.       Influenza A by PCR NEGATIVE NEGATIVE   Influenza B by PCR NEGATIVE NEGATIVE    Comment: (NOTE) The Xpert Xpress SARS-CoV-2/FLU/RSV plus assay is intended as an aid in the diagnosis of influenza from Nasopharyngeal swab specimens and should not be used as a sole basis for treatment. Nasal washings and aspirates are unacceptable for Xpert Xpress SARS-CoV-2/FLU/RSV testing.  Fact Sheet for Patients: EntrepreneurPulse.com.au  Fact Sheet for Healthcare Providers: IncredibleEmployment.be  This test is not yet approved or cleared by the [place] FDA and has been authorized for detection and/or diagnosis of SARS-CoV-2 by FDA under an Emergency Use Authorization (EUA). This EUA will remain in effect (meaning this test can be used) for the duration of the COVID-19 declaration under Section 564(b)(1) of the Act, 21 U.S.C. section 360bbb-3(b)(1), unless the authorization is terminated or revoked.  Performed at [facility], [place] [address] [place], [place] [address]   Troponin I (High Sensitivity)     Status: None   Collection Time: 04/04/20 12:54 PM  Result Value Ref Range   Troponin I (High Sensitivity) 4 <18 ng/L    Comment: (NOTE) Elevated high sensitivity troponin I (hsTnI)  values and significant  changes across serial measurements may suggest ACS but many other  chronic and  acute conditions are known to elevate hsTnI results.  Refer to the "Links" section for chest pain algorithms and additional  guidance. Performed at [facility], [place] [address], [place], [place] [address]   Protime-INR     Status: None   Collection Time: 04/04/20 12:54 PM  Result Value Ref Range   Prothrombin Time 15.0 11.4 - 15.2 seconds   INR 1.2 0.8 - 1.2    Comment: (NOTE) INR goal varies based on device and disease states. Performed at [facility], [place] [address] [place], [place] [address]   APTT     Status: Abnormal   Collection Time: 04/04/20 12:54 PM  Result Value Ref Range   aPTT 38 (H) 24 - 36 seconds    Comment:        IF BASELINE aPTT IS ELEVATED, SUGGEST PATIENT RISK ASSESSMENT BE USED TO DETERMINE APPROPRIATE ANTICOAGULANT THERAPY. Performed at [facility], [place] [address], [place], [place] [address]   Lactic acid, plasma     Status: None   Collection Time: 04/04/20  7:13 PM  Result Value Ref Range   Lactic Acid, Venous 1.4 0.5 - 1.9 mmol/L    Comment: Performed at [facility] [address] [place], [place] [address]  CBC     Status: Abnormal   Collection Time: 04/04/20  7:13 PM  Result Value Ref Range   WBC 14.3 (H) 4.0 - 10.5 K/uL   RBC 2.87 (L) 4.22 - 5.81 MIL/uL   Hemoglobin 8.2 (L) 13.0 - 17.0 g/dL   HCT 25.4 (L) 39.0 - 52.0 %   MCV 88.5 80.0 - 100.0 fL   MCH 28.6 26.0 - 34.0 pg   MCHC 32.3 30.0 - 36.0 g/dL   RDW 13.4 11.5 - 15.5 %   Platelets 608 (H) 150 - 400 K/uL   nRBC 0.0 0.0 - 0.2 %    Comment: Performed at [facility] [address], [place], [place] [address]  Creatinine, serum     Status: None   Collection Time: 04/04/20  7:13 PM  Result Value Ref Range   Creatinine, Ser 0.84 0.61 - 1.24 mg/dL   GFR, Estimated >60 >60 mL/min    Comment: (NOTE) Calculated using the CKD-EPI Creatinine Equation (2021) Performed at [facility] [address], [place], [place] [address]   Urinalysis, Routine w  reflex microscopic     Status: None   Collection Time: 04/04/20 10:17 PM  Result Value Ref Range   Color, Urine YELLOW YELLOW   APPearance CLEAR CLEAR   Specific Gravity, Urine 1.006 1.005 - 1.030   pH 6.0 5.0 - 8.0   Glucose, UA NEGATIVE NEGATIVE mg/dL   Hgb urine dipstick NEGATIVE NEGATIVE   Bilirubin Urine NEGATIVE NEGATIVE   Ketones, ur NEGATIVE NEGATIVE mg/dL   Protein, ur NEGATIVE NEGATIVE mg/dL   Nitrite NEGATIVE NEGATIVE   Leukocytes,Ua NEGATIVE NEGATIVE    Comment: Performed at [facility] [address], [place], [place] [address]  Urine culture     Status: None   Collection Time: 04/04/20 10:17 PM   Specimen: In/Out Cath Urine  Result Value Ref Range   Specimen Description IN/OUT CATH URINE    Special Requests NONE    Culture      NO GROWTH Performed at [facility], [place],  [place] [address]    Report Status 04/06/2020 FINAL   MRSA PCR Screening     Status: None   Collection Time: 04/04/20 10:17 PM   Specimen: Nasopharyngeal  Result Value Ref Range   MRSA by PCR NEGATIVE NEGATIVE    Comment:        The GeneXpert MRSA Assay (FDA approved for NASAL specimens only), is one component of a comprehensive MRSA colonization surveillance program. It is not intended to diagnose MRSA infection nor to guide or monitor treatment for MRSA infections. Performed at [facility], [place] [address], [place], [place] [address]   Basic metabolic panel     Status: Abnormal   Collection Time: 04/05/20  2:18 AM  Result Value Ref Range   Sodium 133 (L) 135 - 145 mmol/L   Potassium 5.4 (H) 3.5 - 5.1 mmol/L   Chloride 101 98 - 111 mmol/L   CO2 20 (L) 22 - 32 mmol/L   Glucose, Bld 138 (H) 70 - 99 mg/dL    Comment: Glucose reference range applies only to samples taken after fasting for at least 8 hours.   BUN 15 6 - 20 mg/dL   Creatinine, Ser 0.70 0.61 - 1.24 mg/dL   Calcium 8.4 (L) 8.9 - 10.3 mg/dL   GFR, Estimated >60 >60 mL/min    Comment:  (NOTE) Calculated using the CKD-EPI Creatinine Equation (2021)    Anion gap 12 5 - 15    Comment: Performed at [facility] [address], [place], [place] [address]  CBC     Status: Abnormal   Collection Time: 04/05/20  2:18 AM  Result Value Ref Range   WBC 9.9 4.0 - 10.5 K/uL   RBC 2.38 (L) 4.22 - 5.81 MIL/uL   Hemoglobin 7.0 (L) 13.0 - 17.0 g/dL    Comment: REPEATED TO VERIFY   HCT 20.8 (L) 39.0 - 52.0 %   MCV 87.4 80.0 - 100.0 fL   MCH 29.4 26.0 - 34.0 pg   MCHC 33.7 30.0 - 36.0 g/dL   RDW 13.7 11.5 - 15.5 %   Platelets 480 (H) 150 - 400 K/uL   nRBC 0.0 0.0 - 0.2 %    Comment: Performed at [facility] [address], [place], [place] [address]  Type and screen [place]     Status: None   Collection Time: 04/05/20  7:41 AM  Result Value Ref Range   ABO/RH(D) O POS    Antibody Screen NEG    Sample Expiration 04/08/2020,2359    Unit Number [id number]    Blood Component Type RED CELLS,LR    Unit division 00    Status of Unit ISSUED,FINAL    Transfusion Status OK TO TRANSFUSE    Crossmatch Result      Compatible Performed at [facility], [place] [address] [place], [place] [address]   Vitamin B12     Status: None   Collection Time: 04/05/20  7:41 AM  Result Value Ref Range   Vitamin B-12 225 180 - 914 pg/mL    Comment: (NOTE) This assay is not validated for testing neonatal or myeloproliferative syndrome specimens for Vitamin B12 levels. Performed at [facility], [place] [address] [place], [place] [address]   Folate     Status: None   Collection Time: 04/05/20  7:41 AM  Result Value Ref Range   Folate 10.4 >5.9 ng/mL    Comment: Performed at [facility],  [address] [place], [place] [address]  Iron and TIBC     Status: Abnormal   Collection Time: 04/05/20  7:41 AM  Result Value Ref Range   Iron 21 (L) 45 - 182 ug/dL   TIBC 98 (L) 250 - 450 ug/dL   Saturation Ratios 21 17.9 - 39.5 %   UIBC 77 ug/dL     Comment: Performed at [facility], [place] [address] [place], [place] [address]  Ferritin     Status: Abnormal   Collection Time: 04/05/20  7:41 AM  Result Value Ref Range   Ferritin 853 (H) 24 - 336 ng/mL    Comment: Performed at [facility], [place] [address] [place], [place] [address]  Basic metabolic panel     Status: Abnormal   Collection Time: 04/05/20  7:41 AM  Result Value Ref Range   Sodium 136 135 - 145 mmol/L   Potassium 4.1 3.5 - 5.1 mmol/L    Comment: DELTA CHECK NOTED   Chloride 102 98 - 111 mmol/L   CO2 22 22 - 32 mmol/L   Glucose, Bld 98 70 - 99 mg/dL    Comment: Glucose reference range applies only to samples taken after fasting for at least 8 hours.   BUN 12 6 - 20 mg/dL   Creatinine, Ser 0.57 (L) 0.61 - 1.24 mg/dL   Calcium 8.2 (L) 8.9 - 10.3 mg/dL   GFR, Estimated >60 >60 mL/min    Comment: (NOTE) Calculated using the CKD-EPI Creatinine Equation (2021)    Anion gap 12 5 - 15    Comment: Performed at [facility] [address] [place], [place] [address]  Prepare RBC (crossmatch)     Status: None   Collection Time: 04/05/20  7:41 AM  Result Value Ref Range   Order Confirmation      ORDER PROCESSED BY BLOOD BANK Performed at [facility], [place] [address] [place], [place] [address]   BPAM RBC     Status: None   Collection Time: 04/05/20  7:41 AM  Result Value Ref Range   ISSUE DATE / TIME [id number]    Blood Product Unit Number [id number]    PRODUCT CODE [id number]    Unit Type and Rh 5100    Blood Product Expiration Date [id number]   Cytology - Non PAP;     Status: None   Collection Time: 04/05/20 11:26 AM  Result Value Ref Range   CYTOLOGY - NON GYN      CYTOLOGY - NON PAP CASE: [id number] PATIENT: Clint Lipps Non-Gynecological Cytology Report     Clinical History: Pneumonia Specimen Submitted:  A. LUNG, RIGHT, BRONCHIAL WASHING:   FINAL MICROSCOPIC DIAGNOSIS: - No malignant cells identified  SPECIMEN  ADEQUACY: Satisfactory for evaluation  GROSS: Received is/are 5 cc's of light red fluid with mucoid material (GW:gw) Smears: 2 Concentration Method (Thin Prep): 0 Cell Block: 0 Additional Studies: N/A     Final Diagnosis performed by Gillie Manners, MD.   Electronically signed 04/07/2020 Technical and / or Professional components performed at [employer]. [facility], [place] [address], [place],  [address].  Immunohistochemistry Technical component (if applicable) was performed at [facility]. [address] [place], [place],  [address].   IMMUNOHISTOCHEMISTRY DISCLAIMER (if applicable): Some of these immunohistochemical stains may have been developed and the  performance characteristics determine by [facility]. Some may not have been cleared or approved by the U.S. Food and  Drug Administration. The FDA has determined that such clearance or approval is not necessary. This test is used for clinical purposes. It should not be regarded as investigational or for research. This laboratory is certified under the [place] (CLIA-88) as qualified to perform high complexity clinical laboratory testing.  The controls stained appropriately.   Culture, fungus without smear     Status: None   Collection Time: 04/05/20 11:26 AM   Specimen: Bronchial Washing, Right; Lung  Result Value Ref Range   Specimen Description BRONCHIAL ALVEOLAR LAVAGE    Special Requests BRONCH [place]    Culture      NO FUNGUS ISOLATED AFTER 21 DAYS Performed at [facility], [place] [address] [place] [place], [place] [address]    Report Status 04/26/2020 FINAL   Culture, respiratory     Status: None   Collection Time: 04/05/20 11:26 AM   Specimen: Bronchial Washing, Right; Lung  Result Value Ref Range   Specimen Description BRONCHIAL ALVEOLAR LAVAGE    Special Requests [place]    Gram Stain      ABUNDANT WBC PRESENT,BOTH  PMN AND MONONUCLEAR RARE GRAM POSITIVE COCCI IN PAIRS    Culture      RARE Normal respiratory flora-no Staph aureus or Pseudomonas seen Performed at [facility] [address] [place], [place] [address]    Report Status 04/07/2020 FINAL   Acid Fast Culture with reflexed sensitivities     Status: None   Collection Time: 04/05/20 11:26 AM   Specimen: Bronchial Washing, Right; Lung  Result Value Ref Range   Acid Fast Culture Negative     Comment: (NOTE) No acid fast bacilli isolated after 6 weeks. Performed At: [facility] [place], [place] [id number] Rush Farmer MD QQ:[id number]    Source of Sample BRONCHIAL ALVEOLAR LAVAGE     Comment: Performed at [facility], [place] [address], [place], [place] [address]  Acid Fast Smear (AFB)     Status: None   Collection Time: 04/05/20 11:26 AM   Specimen: Bronchial Washing, Right; Lung  Result Value Ref Range   AFB Specimen Processing Concentration    Acid Fast Smear Negative     Comment: (NOTE) Performed At: [facility] [place], [place] [id number] Rush Farmer MD NK:[id number]    Source (AFB) BRONCHIAL ALVEOLAR LAVAGE     Comment: Performed at [facility], [employer] [address] [place], [place] [address]  Surgical pathology     Status: None   Collection Time: 04/05/20 11:29 AM  Result Value Ref Range   SURGICAL PATHOLOGY      SURGICAL PATHOLOGY CASE: [id number] PATIENT: Clint Lipps Surgical Pathology Report     Clinical History: Pneumonia (cm)     FINAL MICROSCOPIC DIAGNOSIS:  A. BRONCHUS, RIGHT LOWER LOBE, BIOPSY: - Benign bronchial wall and scant lung parenchyma with mild reactive changes. - No malignancy identified.   COMMENT:  Dr. Vic Ripper reviewed.  GROSS DESCRIPTION:  Received in formalin are multiple tan soft tissues, 0.3 x 0.2 x 0.2 cm, in toto 1 block.  [facility] 04/06/2020)   Final Diagnosis performed by Gillie Manners, MD.    Electronically signed 04/07/2020 Technical and / or Professional components performed at [facility]. [facility], [place] [address] [place], [place] [address].  Immunohistochemistry Technical component (if applicable) was performed at [facility]. [address], [place], [place], [place] [address].   IMMUNOHISTOCHEMISTRY DISCLAIMER (if applicable): Some  of these immunohistochemical stains may have been developed  and the performance characteristics determine by [facility]. Some may not have been cleared or approved by the U.S. Food and Drug Administration. The FDA has determined that such clearance or approval is not necessary. This test is used for clinical purposes. It should not be regarded as investigational or for research. This laboratory is certified under the [place] (CLIA-88) as qualified to perform high complexity clinical laboratory testing.  The controls stained appropriately.   Hemoglobin and hematocrit, blood     Status: Abnormal   Collection Time: 04/05/20  8:16 PM  Result Value Ref Range   Hemoglobin 9.2 (L) 13.0 - 17.0 g/dL    Comment: REPEATED TO VERIFY POST TRANSFUSION SPECIMEN    HCT 28.4 (L) 39.0 - 52.0 %    Comment: Performed at [facility] [address] [place], [place] [address]  Basic metabolic panel     Status: Abnormal   Collection Time: 04/06/20  8:34 AM  Result Value Ref Range   Sodium 137 135 - 145 mmol/L   Potassium 3.9 3.5 - 5.1 mmol/L   Chloride 101 98 - 111 mmol/L   CO2 26 22 - 32 mmol/L   Glucose, Bld 117 (H) 70 - 99 mg/dL    Comment: Glucose reference range applies only to samples taken after fasting for at least 8 hours.   BUN 11 6 - 20 mg/dL   Creatinine, Ser 0.62 0.61 - 1.24 mg/dL   Calcium 8.7 (L) 8.9 - 10.3 mg/dL   GFR, Estimated >60 >60 mL/min    Comment: (NOTE) Calculated using the CKD-EPI Creatinine Equation (2021)    Anion gap 10 5 - 15     Comment: Performed at [facility] [address], [place], [place] [address]  CBC with Differential/Platelet     Status: Abnormal   Collection Time: 04/06/20  8:34 AM  Result Value Ref Range   WBC 10.3 4.0 - 10.5 K/uL   RBC 2.92 (L) 4.22 - 5.81 MIL/uL   Hemoglobin 8.4 (L) 13.0 - 17.0 g/dL   HCT 25.3 (L) 39.0 - 52.0 %   MCV 86.6 80.0 - 100.0 fL   MCH 28.8 26.0 - 34.0 pg   MCHC 33.2 30.0 - 36.0 g/dL   RDW 13.8 11.5 - 15.5 %   Platelets 484 (H) 150 - 400 K/uL   nRBC 0.0 0.0 - 0.2 %   Neutrophils Relative % 92 %   Neutro Abs 9.4 (H) 1.7 - 7.7 K/uL   Lymphocytes Relative 3 %   Lymphs Abs 0.3 (L) 0.7 - 4.0 K/uL   Monocytes Relative 4 %   Monocytes Absolute 0.4 0.1 - 1.0 K/uL   Eosinophils Relative 0 %   Eosinophils Absolute 0.0 0.0 - 0.5 K/uL   Basophils Relative 0 %   Basophils Absolute 0.0 0.0 - 0.1 K/uL   Immature Granulocytes 1 %   Abs Immature Granulocytes 0.09 (H) 0.00 - 0.07 K/uL    Comment: Performed at [facility] [address] [place], [place] [address]  Basic metabolic panel     Status: Abnormal   Collection Time: 04/07/20  1:34 AM  Result Value Ref Range   Sodium 136 135 - 145 mmol/L   Potassium 3.7 3.5 - 5.1 mmol/L   Chloride 101 98 - 111 mmol/L   CO2 26 22 - 32 mmol/L   Glucose, Bld 147 (H) 70 - 99 mg/dL  Comment: Glucose reference range applies only to samples taken after fasting for at least 8 hours.   BUN 11 6 - 20 mg/dL   Creatinine, Ser 0.64 0.61 - 1.24 mg/dL   Calcium 8.7 (L) 8.9 - 10.3 mg/dL   GFR, Estimated >60 >60 mL/min    Comment: (NOTE) Calculated using the CKD-EPI Creatinine Equation (2021)    Anion gap 9 5 - 15    Comment: Performed at [facility] [address], [place], [place] [address]  CBC with Differential/Platelet     Status: Abnormal   Collection Time: 04/07/20  1:34 AM  Result Value Ref Range   WBC 10.3 4.0 - 10.5 K/uL   RBC 2.78 (L) 4.22 - 5.81 MIL/uL   Hemoglobin 8.2 (L) 13.0 - 17.0 g/dL   HCT 24.1 (L)  39.0 - 52.0 %   MCV 86.7 80.0 - 100.0 fL   MCH 29.5 26.0 - 34.0 pg   MCHC 34.0 30.0 - 36.0 g/dL   RDW 13.8 11.5 - 15.5 %   Platelets 463 (H) 150 - 400 K/uL   nRBC 0.0 0.0 - 0.2 %   Neutrophils Relative % 87 %   Neutro Abs 9.0 (H) 1.7 - 7.7 K/uL   Lymphocytes Relative 5 %   Lymphs Abs 0.5 (L) 0.7 - 4.0 K/uL   Monocytes Relative 7 %   Monocytes Absolute 0.7 0.1 - 1.0 K/uL   Eosinophils Relative 0 %   Eosinophils Absolute 0.0 0.0 - 0.5 K/uL   Basophils Relative 0 %   Basophils Absolute 0.0 0.0 - 0.1 K/uL   Immature Granulocytes 1 %   Abs Immature Granulocytes 0.09 (H) 0.00 - 0.07 K/uL    Comment: Performed at [facility], [address] [place], [place] [address]  CMP ([place] only)     Status: Abnormal   Collection Time: 04/22/20 10:04 AM  Result Value Ref Range   Sodium 140 135 - 145 mmol/L   Potassium 2.8 (L) 3.5 - 5.1 mmol/L   Chloride 103 98 - 111 mmol/L   CO2 27 22 - 32 mmol/L   Glucose, Bld 109 (H) 70 - 99 mg/dL    Comment: Glucose reference range applies only to samples taken after fasting for at least 8 hours.   BUN 11 6 - 20 mg/dL   Creatinine 0.68 0.61 - 1.24 mg/dL   Calcium 9.7 8.9 - 10.3 mg/dL   Total Protein 7.2 6.5 - 8.1 g/dL   Albumin 2.4 (L) 3.5 - 5.0 g/dL   AST 16 15 - 41 U/L   ALT 15 0 - 44 U/L   Alkaline Phosphatase 111 38 - 126 U/L   Total Bilirubin 0.6 0.3 - 1.2 mg/dL   GFR, Estimated >60 >60 mL/min    Comment: (NOTE) Calculated using the CKD-EPI Creatinine Equation (2021)    Anion gap 10 5 - 15    Comment: Performed at [facility], [place] [address], [place], [place] [address]  CBC with Differential ([place] Only)     Status: Abnormal   Collection Time: 04/22/20 10:04 AM  Result Value Ref Range   WBC Count 11.0 (H) 4.0 - 10.5 K/uL   RBC 3.19 (L) 4.22 - 5.81 MIL/uL   Hemoglobin 9.0 (L) 13.0 - 17.0 g/dL   HCT 28.1 (L) 39.0 - 52.0 %   MCV 88.1 80.0 - 100.0 fL   MCH 28.2 26.0 - 34.0 pg   MCHC 32.0 30.0 -  36.0  g/dL   RDW 15.1 11.5 - 15.5 %   Platelet Count 439 (H) 150 - 400 K/uL   nRBC 0.0 0.0 - 0.2 %   Neutrophils Relative % 79 %   Neutro Abs 8.7 (H) 1.7 - 7.7 K/uL   Lymphocytes Relative 10 %   Lymphs Abs 1.1 0.7 - 4.0 K/uL   Monocytes Relative 8 %   Monocytes Absolute 0.9 0.1 - 1.0 K/uL   Eosinophils Relative 2 %   Eosinophils Absolute 0.2 0.0 - 0.5 K/uL   Basophils Relative 0 %   Basophils Absolute 0.0 0.0 - 0.1 K/uL   Immature Granulocytes 1 %   Abs Immature Granulocytes 0.05 0.00 - 0.07 K/uL    Comment: Performed at [facility], [place] [address], [place], [place] [address]  Urine culture     Status: None   Collection Time: 05/08/20 11:04 AM   Specimen: In/Out Cath Urine  Result Value Ref Range   Specimen Description IN/OUT CATH URINE    Special Requests NONE    Culture      NO GROWTH Performed at [facility], [place] [address] [place], [place] [address]    Report Status 05/10/2020 FINAL   Comprehensive metabolic panel     Status: Abnormal   Collection Time: 05/08/20 11:35 AM  Result Value Ref Range   Sodium 136 135 - 145 mmol/L   Potassium 3.0 (L) 3.5 - 5.1 mmol/L   Chloride 98 98 - 111 mmol/L   CO2 23 22 - 32 mmol/L   Glucose, Bld 111 (H) 70 - 99 mg/dL    Comment: Glucose reference range applies only to samples taken after fasting for at least 8 hours.   BUN 13 6 - 20 mg/dL   Creatinine, Ser 0.72 0.61 - 1.24 mg/dL   Calcium 9.4 8.9 - 10.3 mg/dL   Total Protein 6.8 6.5 - 8.1 g/dL   Albumin 2.3 (L) 3.5 - 5.0 g/dL   AST 17 15 - 41 U/L   ALT 15 0 - 44 U/L   Alkaline Phosphatase 95 38 - 126 U/L   Total Bilirubin 0.3 0.3 - 1.2 mg/dL   GFR, Estimated >60 >60 mL/min    Comment: (NOTE) Calculated using the CKD-EPI Creatinine Equation (2021)    Anion gap 15 5 - 15    Comment: Performed at [facility] [address], [place], [place] [address]  Troponin I (High Sensitivity)     Status: None   Collection Time: 05/08/20 11:35 AM   Result Value Ref Range   Troponin I (High Sensitivity) 12 <18 ng/L    Comment: (NOTE) Elevated high sensitivity troponin I (hsTnI) values and significant  changes across serial measurements may suggest ACS but many other  chronic and acute conditions are known to elevate hsTnI results.  Refer to the "Links" section for chest pain algorithms and additional  guidance. Performed at [facility], [place] [address] [place], [place] [address]   CBC     Status: Abnormal   Collection Time: 05/08/20 11:37 AM  Result Value Ref Range   WBC 19.4 (H) 4.0 - 10.5 K/uL   RBC 3.08 (L) 4.22 - 5.81 MIL/uL   Hemoglobin 8.0 (L) 13.0 - 17.0 g/dL   HCT 27.3 (L) 39.0 - 52.0 %   MCV 88.6 80.0 - 100.0 fL   MCH 26.0 26.0 - 34.0 pg   MCHC 29.3 (L) 30.0 - 36.0 g/dL   RDW 16.2 (H) 11.5 -  15.5 %   Platelets 703 (H) 150 - 400 K/uL   nRBC 0.0 0.0 - 0.2 %    Comment: Performed at [facility], [place] [address], [place], [place] [address]  Blood Culture (routine x 2)     Status: None   Collection Time: 05/08/20 12:48 PM   Specimen: BLOOD  Result Value Ref Range   Specimen Description BLOOD RIGHT ANTECUBITAL    Special Requests      BOTTLES DRAWN AEROBIC AND ANAEROBIC Blood Culture results may not be optimal due to an excessive volume of blood received in culture bottles   Culture      NO GROWTH 5 DAYS Performed at [facility] [address], [place], [place] [address]    Report Status 05/13/2020 FINAL   Lactic acid, plasma     Status: None   Collection Time: 05/08/20 12:49 PM  Result Value Ref Range   Lactic Acid, Venous 1.1 0.5 - 1.9 mmol/L    Comment: Performed at [facility], [place] [address] [place], [place] [address]  Protime-INR     Status: None   Collection Time: 05/08/20 12:49 PM  Result Value Ref Range   Prothrombin Time 14.9 11.4 - 15.2 seconds   INR 1.2 0.8 - 1.2    Comment: (NOTE) INR goal varies based on device and disease states. Performed at [facility], [place] [address] [place], [place] [address]   APTT     Status: None   Collection Time: 05/08/20 12:49 PM  Result Value Ref Range   aPTT 36 24 - 36 seconds    Comment: Performed at [facility] [address], [place], [place] [address]  Troponin I (High Sensitivity)     Status: None   Collection Time: 05/08/20 12:49 PM  Result Value Ref Range   Troponin I (High Sensitivity) 4 <18 ng/L    Comment: (NOTE) Elevated high sensitivity troponin I (hsTnI) values and significant  changes across serial measurements may suggest ACS but many other  chronic and acute conditions are known to elevate hsTnI results.  Refer to the "Links" section for chest pain algorithms and additional  guidance. Performed at [facility], [place] [address], [place], [place] [address]   Resp panel by RT-PCR (RSV, Flu A&B, Covid) Nasopharyngeal Swab     Status: None   Collection Time: 05/08/20 12:52 PM   Specimen: Nasopharyngeal Swab; Nasopharyngeal(NP) swabs in vial transport medium  Result Value Ref Range   SARS Coronavirus 2 by RT PCR NEGATIVE NEGATIVE    Comment: (NOTE) SARS-CoV-2 target nucleic acids are NOT DETECTED.  The SARS-CoV-2 RNA is generally detectable in upper respiratory specimens during the acute phase of infection. The lowest concentration of SARS-CoV-2 viral copies this assay can detect is 138 copies/mL. A negative result does not preclude SARS-Cov-2 infection and should not be used as the sole basis for treatment or other patient management decisions. A negative result may occur with  improper specimen collection/handling, submission of specimen other than nasopharyngeal swab, presence of viral mutation(s) within the areas targeted by this assay, and inadequate number of viral copies(<138 copies/mL). A negative result must be combined with clinical observations, patient history, and epidemiological information. The expected result is Negative.  Fact Sheet for Patients:   EntrepreneurPulse.com.au  Fact Sheet for Healthcare Providers:  IncredibleEmployment.be  This test is no t yet approved or cleared by the [place] and  has been authorized for  detection and/or diagnosis of SARS-CoV-2 by FDA under an Emergency Use Authorization (EUA). This EUA will remain  in effect (meaning this test can be used) for the duration of the COVID-19 declaration under Section 564(b)(1) of the Act, 21 U.S.C.section 360bbb-3(b)(1), unless the authorization is terminated  or revoked sooner.       Influenza A by PCR NEGATIVE NEGATIVE   Influenza B by PCR NEGATIVE NEGATIVE    Comment: (NOTE) The Xpert Xpress SARS-CoV-2/FLU/RSV plus assay is intended as an aid in the diagnosis of influenza from Nasopharyngeal swab specimens and should not be used as a sole basis for treatment. Nasal washings and aspirates are unacceptable for Xpert Xpress SARS-CoV-2/FLU/RSV testing.  Fact Sheet for Patients: EntrepreneurPulse.com.au  Fact Sheet for Healthcare Providers: IncredibleEmployment.be  This test is not yet approved or cleared by the [place] FDA and has been authorized for detection and/or diagnosis of SARS-CoV-2 by FDA under an Emergency Use Authorization (EUA). This EUA will remain in effect (meaning this test can be used) for the duration of the COVID-19 declaration under Section 564(b)(1) of the Act, 21 U.S.C. section 360bbb-3(b)(1), unless the authorization is terminated or revoked.     Resp Syncytial Virus by PCR NEGATIVE NEGATIVE    Comment: (NOTE) Fact Sheet for Patients: EntrepreneurPulse.com.au  Fact Sheet for Healthcare Providers: IncredibleEmployment.be  This test is not yet approved or cleared by the [place] FDA and has been authorized for detection and/or diagnosis of SARS-CoV-2 by FDA under an Emergency Use Authorization (EUA).  This EUA will remain in effect (meaning this test can be used) for the duration of the COVID-19 declaration under Section 564(b)(1) of the Act, 21 U.S.C. section 360bbb-3(b)(1), unless the authorization is terminated or revoked.  Performed at [facility], [place] [address] [place], [place] [address]   Blood Culture (routine x 2)     Status: None   Collection Time: 05/08/20  1:04 PM   Specimen: BLOOD  Result Value Ref Range   Specimen Description BLOOD SITE NOT SPECIFIED    Special Requests      BOTTLES DRAWN AEROBIC AND ANAEROBIC Blood Culture adequate volume   Culture      NO GROWTH 5 DAYS Performed at [facility], [place] [address] [place], [place] [address]    Report Status 05/13/2020 FINAL   Urinalysis, Routine w reflex microscopic Urine, Clean Catch     Status: Abnormal   Collection Time: 05/08/20  5:08 PM  Result Value Ref Range   Color, Urine STRAW (A) YELLOW   APPearance CLEAR CLEAR   Specific Gravity, Urine 1.014 1.005 - 1.030   pH 6.0 5.0 - 8.0   Glucose, UA NEGATIVE NEGATIVE mg/dL   Hgb urine dipstick NEGATIVE NEGATIVE   Bilirubin Urine NEGATIVE NEGATIVE   Ketones, ur NEGATIVE NEGATIVE mg/dL   Protein, ur NEGATIVE NEGATIVE mg/dL   Nitrite NEGATIVE NEGATIVE   Leukocytes,Ua NEGATIVE NEGATIVE    Comment: Performed at [facility] [address], [place], [place] [address]  MRSA PCR Screening     Status: None   Collection Time: 05/08/20  8:17 PM   Specimen: Nasal Mucosa; Nasopharyngeal  Result Value Ref Range   MRSA by PCR NEGATIVE NEGATIVE    Comment:        The GeneXpert MRSA Assay (FDA approved for NASAL specimens only), is one component of a comprehensive MRSA colonization surveillance program. It is not intended to diagnose MRSA infection nor to guide or monitor treatment for MRSA  infections. Performed at [facility], [place] [address] [place], [place] [address]   Lactic acid, plasma     Status: None   Collection Time: 05/08/20   8:33 PM  Result Value Ref Range   Lactic Acid, Venous 1.0 0.5 - 1.9 mmol/L    Comment: Performed at [facility] [address], [place], [place] [address]  Procalcitonin - Baseline     Status: None   Collection Time: 05/08/20  8:33 PM  Result Value Ref Range   Procalcitonin 0.23 ng/mL    Comment:        Interpretation: PCT (Procalcitonin) <= 0.5 ng/mL: Systemic infection (sepsis) is not likely. Local bacterial infection is possible. (NOTE)       Sepsis PCT Algorithm           Lower Respiratory Tract                                      Infection PCT Algorithm    ----------------------------     ----------------------------         PCT < 0.25 ng/mL                PCT < 0.10 ng/mL          Strongly encourage             Strongly discourage   discontinuation of antibiotics    initiation of antibiotics    ----------------------------     -----------------------------       PCT 0.25 - 0.50 ng/mL            PCT 0.10 - 0.25 ng/mL               OR       >80% decrease in PCT            Discourage initiation of                                            antibiotics      Encourage discontinuation           of antibiotics    ----------------------------     -----------------------------         PCT >= 0.50 ng/mL              PCT 0.26 - 0.50 ng/mL               AND        <80% decrease in PCT             Encourage initiation of                                             antibiotics       Encourage continuation           of antibiotics    ----------------------------     -----------------------------        PCT >= 0.50 ng/mL                  PCT > 0.50 ng/mL               AND  increase in PCT                  Strongly encourage                                      initiation of antibiotics    Strongly encourage escalation           of antibiotics                                     -----------------------------                                           PCT <= 0.25 ng/mL                                                  OR                                        > 80% decrease in PCT                                      Discontinue / Do not initiate                                             antibiotics  Performed at [facility], [address], [place], [place] [address]   CBC     Status: Abnormal   Collection Time: 05/09/20  1:25 AM  Result Value Ref Range   WBC 15.2 (H) 4.0 - 10.5 K/uL   RBC 2.67 (L) 4.22 - 5.81 MIL/uL   Hemoglobin 7.0 (L) 13.0 - 17.0 g/dL   HCT 24.0 (L) 39.0 - 52.0 %   MCV 89.9 80.0 - 100.0 fL   MCH 26.2 26.0 - 34.0 pg   MCHC 29.2 (L) 30.0 - 36.0 g/dL   RDW 16.2 (H) 11.5 - 15.5 %   Platelets 522 (H) 150 - 400 K/uL   nRBC 0.0 0.0 - 0.2 %    Comment: Performed at [facility] [address] [place], [place] [address]  Basic metabolic panel     Status: Abnormal   Collection Time: 05/09/20  1:25 AM  Result Value Ref Range   Sodium 137 135 - 145 mmol/L   Potassium 3.4 (L) 3.5 - 5.1 mmol/L   Chloride 102 98 - 111 mmol/L   CO2 24 22 - 32 mmol/L   Glucose, Bld 114 (H) 70 - 99 mg/dL    Comment: Glucose reference range applies only to samples taken after fasting for at least 8 hours.   BUN 14 6 - 20 mg/dL   Creatinine, Ser 0.57 (L) 0.61 - 1.24 mg/dL   Calcium 8.5 (L) 8.9 - 10.3 mg/dL   GFR, Estimated >  60 >60 mL/min    Comment: (NOTE) Calculated using the CKD-EPI Creatinine Equation (2021)    Anion gap 11 5 - 15    Comment: Performed at [facility], [place] [address], [place], [place] [address]  Procalcitonin     Status: None   Collection Time: 05/09/20  1:25 AM  Result Value Ref Range   Procalcitonin 0.28 ng/mL    Comment:        Interpretation: PCT (Procalcitonin) <= 0.5 ng/mL: Systemic infection (sepsis) is not likely. Local bacterial infection is possible. (NOTE)       Sepsis PCT Algorithm           Lower Respiratory Tract                                      Infection PCT Algorithm     ----------------------------     ----------------------------         PCT < 0.25 ng/mL                PCT < 0.10 ng/mL          Strongly encourage             Strongly discourage   discontinuation of antibiotics    initiation of antibiotics    ----------------------------     -----------------------------       PCT 0.25 - 0.50 ng/mL            PCT 0.10 - 0.25 ng/mL               OR       >80% decrease in PCT            Discourage initiation of                                            antibiotics      Encourage discontinuation           of antibiotics    ----------------------------     -----------------------------         PCT >= 0.50 ng/mL              PCT 0.26 - 0.50 ng/mL               AND        <80% decrease in PCT             Encourage initiation of                                             antibiotics       Encourage continuation           of antibiotics    ----------------------------     -----------------------------        PCT >= 0.50 ng/mL                  PCT > 0.50 ng/mL               AND         increase in PCT  Strongly encourage                                      initiation of antibiotics    Strongly encourage escalation           of antibiotics                                     -----------------------------                                           PCT <= 0.25 ng/mL                                                 OR                                        > 80% decrease in PCT                                      Discontinue / Do not initiate                                             antibiotics  Performed at [facility], [place] [address] [place], [place] [address]   Procalcitonin     Status: None   Collection Time: 05/10/20  1:24 AM  Result Value Ref Range   Procalcitonin 0.12 ng/mL    Comment:        Interpretation: PCT (Procalcitonin) <= 0.5 ng/mL: Systemic infection (sepsis) is not likely. Local bacterial infection is  possible. (NOTE)       Sepsis PCT Algorithm           Lower Respiratory Tract                                      Infection PCT Algorithm    ----------------------------     ----------------------------         PCT < 0.25 ng/mL                PCT < 0.10 ng/mL          Strongly encourage             Strongly discourage   discontinuation of antibiotics    initiation of antibiotics    ----------------------------     -----------------------------       PCT 0.25 - 0.50 ng/mL            PCT 0.10 - 0.25 ng/mL               OR       >80% decrease in PCT  Discourage initiation of                                            antibiotics      Encourage discontinuation           of antibiotics    ----------------------------     -----------------------------         PCT >= 0.50 ng/mL              PCT 0.26 - 0.50 ng/mL               AND        <80% decrease in PCT             Encourage initiation of                                             antibiotics       Encourage continuation           of antibiotics    ----------------------------     -----------------------------        PCT >= 0.50 ng/mL                  PCT > 0.50 ng/mL               AND         increase in PCT                  Strongly encourage                                      initiation of antibiotics    Strongly encourage escalation           of antibiotics                                     -----------------------------                                           PCT <= 0.25 ng/mL                                                 OR                                        > 80% decrease in PCT                                      Discontinue / Do not initiate  antibiotics  Performed at [facility], [place] [address] [place], [place] [address]   CBC with Differential/Platelet     Status: Abnormal   Collection Time: 05/10/20  1:24 AM  Result Value Ref Range   WBC  15.4 (H) 4.0 - 10.5 K/uL   RBC 2.54 (L) 4.22 - 5.81 MIL/uL   Hemoglobin 6.8 (LL) 13.0 - 17.0 g/dL    Comment: REPEATED TO VERIFY THIS CRITICAL RESULT HAS VERIFIED AND BEEN CALLED TO S.GURENG,RN BY MELISSA BROGDON ON 01 16 2022 AT 0253, AND HAS BEEN READ BACK.     HCT 21.7 (L) 39.0 - 52.0 %   MCV 85.4 80.0 - 100.0 fL   MCH 26.8 26.0 - 34.0 pg   MCHC 31.3 30.0 - 36.0 g/dL   RDW 16.1 (H) 11.5 - 15.5 %   Platelets 479 (H) 150 - 400 K/uL   nRBC 0.0 0.0 - 0.2 %   Neutrophils Relative % 86 %   Neutro Abs 13.3 (H) 1.7 - 7.7 K/uL   Lymphocytes Relative 6 %   Lymphs Abs 0.9 0.7 - 4.0 K/uL   Monocytes Relative 7 %   Monocytes Absolute 1.1 (H) 0.1 - 1.0 K/uL   Eosinophils Relative 0 %   Eosinophils Absolute 0.0 0.0 - 0.5 K/uL   Basophils Relative 0 %   Basophils Absolute 0.0 0.0 - 0.1 K/uL   Immature Granulocytes 1 %   Abs Immature Granulocytes 0.13 (H) 0.00 - 0.07 K/uL    Comment: Performed at [facility] [address], [place], [place] [address]  Comprehensive metabolic panel     Status: Abnormal   Collection Time: 05/10/20  1:24 AM  Result Value Ref Range   Sodium 138 135 - 145 mmol/L   Potassium 3.1 (L) 3.5 - 5.1 mmol/L   Chloride 103 98 - 111 mmol/L   CO2 27 22 - 32 mmol/L   Glucose, Bld 136 (H) 70 - 99 mg/dL    Comment: Glucose reference range applies only to samples taken after fasting for at least 8 hours.   BUN 14 6 - 20 mg/dL   Creatinine, Ser 0.64 0.61 - 1.24 mg/dL   Calcium 8.5 (L) 8.9 - 10.3 mg/dL   Total Protein 5.5 (L) 6.5 - 8.1 g/dL   Albumin 1.9 (L) 3.5 - 5.0 g/dL   AST 12 (L) 15 - 41 U/L   ALT 10 0 - 44 U/L   Alkaline Phosphatase 74 38 - 126 U/L   Total Bilirubin 0.5 0.3 - 1.2 mg/dL   GFR, Estimated >60 >60 mL/min    Comment: (NOTE) Calculated using the CKD-EPI Creatinine Equation (2021)    Anion gap 8 5 - 15    Comment: Performed at [facility], [place] [address], [place], [place] [address]  Magnesium     Status: Abnormal   Collection Time:  05/10/20  1:24 AM  Result Value Ref Range   Magnesium 1.5 (L) 1.7 - 2.4 mg/dL    Comment: Performed at [facility] [address], [place], [place] [address]  Phosphorus     Status: None   Collection Time: 05/10/20  1:24 AM  Result Value Ref Range   Phosphorus 2.7 2.5 - 4.6 mg/dL    Comment: Performed at [facility] [address] [place], [place] [address]  Reticulocytes     Status: Abnormal   Collection Time: 05/10/20  1:24 AM  Result Value Ref Range   Retic Ct  Pct 3.2 (H) 0.4 - 3.1 %   RBC. 2.52 (L) 4.22 - 5.81 MIL/uL   Retic Count, Absolute 80.1 19.0 - 186.0 K/uL   Immature Retic Fract 32.3 (H) 2.3 - 15.9 %    Comment: Performed at [facility] [address], [place], [place] [address]  Prepare RBC (crossmatch)     Status: None   Collection Time: 05/10/20  8:37 AM  Result Value Ref Range   Order Confirmation      ORDER PROCESSED BY BLOOD BANK Performed at [facility], [place] [address], [place], [place] [address]   Type and screen [place]     Status: None   Collection Time: 05/10/20  8:42 AM  Result Value Ref Range   ABO/RH(D) O POS    Antibody Screen NEG    Sample Expiration 05/13/2020,2359    Unit Number [id number]    Blood Component Type RED CELLS,LR    Unit division 00    Status of Unit ISSUED,FINAL    Transfusion Status OK TO TRANSFUSE    Crossmatch Result      Compatible Performed at [facility], [place] [address], [place], [place] [address]   BPAM RBC     Status: None   Collection Time: 05/10/20  8:42 AM  Result Value Ref Range   ISSUE DATE / TIME [id number]    Blood Product Unit Number [id number]    PRODUCT CODE [id number]    Unit Type and Rh 5100    Blood Product Expiration Date 751700174944   Vitamin B12     Status: None   Collection Time: 05/10/20 10:15 AM  Result Value Ref Range   Vitamin B-12 440 180 - 914 pg/mL    Comment: (NOTE) This assay is not validated for testing neonatal  or myeloproliferative syndrome specimens for Vitamin B12 levels. Performed at [facility], [place] [address] [place], [place] [address]   Iron and TIBC     Status: Abnormal   Collection Time: 05/10/20 10:15 AM  Result Value Ref Range   Iron 34 (L) 45 - 182 ug/dL   TIBC 161 (L) 250 - 450 ug/dL   Saturation Ratios 21 17.9 - 39.5 %   UIBC 127 ug/dL    Comment: Performed at [facility], [place] [address] [place], [place] [address]  Ferritin     Status: Abnormal   Collection Time: 05/10/20 10:15 AM  Result Value Ref Range   Ferritin 828 (H) 24 - 336 ng/mL    Comment: Performed at [facility], [place] [address] [place], [place] [address]  Folate     Status: None   Collection Time: 05/10/20 10:15 AM  Result Value Ref Range   Folate 16.6 >5.9 ng/mL    Comment: Performed at [facility] [address] [place], [place] [address]  Comprehensive metabolic panel     Status: Abnormal   Collection Time: 05/11/20  1:03 AM  Result Value Ref Range   Sodium 140 135 - 145 mmol/L   Potassium 3.4 (L) 3.5 - 5.1 mmol/L   Chloride 105 98 - 111 mmol/L   CO2 26 22 - 32 mmol/L   Glucose, Bld 83 70 - 99 mg/dL    Comment: Glucose reference range applies only to samples taken after fasting for at least 8 hours.   BUN 11 6 - 20 mg/dL   Creatinine, Ser 0.55 (L) 0.61 - 1.24 mg/dL   Calcium  8.3 (L) 8.9 - 10.3 mg/dL   Total Protein 5.5 (L) 6.5 - 8.1 g/dL   Albumin 1.9 (L) 3.5 - 5.0 g/dL   AST 11 (L) 15 - 41 U/L   ALT 11 0 - 44 U/L   Alkaline Phosphatase 68 38 - 126 U/L   Total Bilirubin 0.7 0.3 - 1.2 mg/dL   GFR, Estimated >60 >60 mL/min    Comment: (NOTE) Calculated using the CKD-EPI Creatinine Equation (2021)    Anion gap 9 5 - 15    Comment: Performed at [facility] [address] [place], [place] [address]  CBC with Differential/Platelet     Status: Abnormal   Collection Time: 05/11/20  1:03 AM  Result Value Ref Range   WBC 10.2 4.0 - 10.5 K/uL   RBC 2.88 (L) 4.22 -  5.81 MIL/uL   Hemoglobin 7.8 (L) 13.0 - 17.0 g/dL   HCT 25.0 (L) 39.0 - 52.0 %   MCV 86.8 80.0 - 100.0 fL   MCH 27.1 26.0 - 34.0 pg   MCHC 31.2 30.0 - 36.0 g/dL   RDW 16.0 (H) 11.5 - 15.5 %   Platelets 461 (H) 150 - 400 K/uL   nRBC 0.0 0.0 - 0.2 %   Neutrophils Relative % 81 %   Neutro Abs 8.2 (H) 1.7 - 7.7 K/uL   Lymphocytes Relative 9 %   Lymphs Abs 0.9 0.7 - 4.0 K/uL   Monocytes Relative 9 %   Monocytes Absolute 1.0 0.1 - 1.0 K/uL   Eosinophils Relative 0 %   Eosinophils Absolute 0.0 0.0 - 0.5 K/uL   Basophils Relative 0 %   Basophils Absolute 0.0 0.0 - 0.1 K/uL   Immature Granulocytes 1 %   Abs Immature Granulocytes 0.09 (H) 0.00 - 0.07 K/uL    Comment: Performed at [facility] [address], [place], [place] [address]  Phosphorus     Status: Abnormal   Collection Time: 05/11/20  1:03 AM  Result Value Ref Range   Phosphorus 1.7 (L) 2.5 - 4.6 mg/dL    Comment: Performed at [facility] [address], [place], [place] [address]  Magnesium     Status: None   Collection Time: 05/11/20  1:03 AM  Result Value Ref Range   Magnesium 1.8 1.7 - 2.4 mg/dL    Comment: Performed at [facility] [address], [place], [place] [address]  Expectorated sputum assessment w rflx to resp cult     Status: None   Collection Time: 05/11/20 11:41 AM   Specimen: Expectorated Sputum  Result Value Ref Range   Specimen Description EXPECTORATED SPUTUM    Special Requests NONE    Sputum evaluation      THIS SPECIMEN IS ACCEPTABLE FOR SPUTUM CULTURE Performed at [facility], [place] [address], [place],  [address]    Report Status 05/12/2020 FINAL   Fungus Culture With Stain     Status: None   Collection Time: 05/11/20 11:41 AM  Result Value Ref Range   Fungus Stain Final report    Fungus (Mycology) Culture Final report     Comment: (NOTE) Performed At: [facility] [address] [place], [place] [id number] Rush Farmer MD [id number]     Fungal Source SPUTUM     Comment: Performed at [facility], [place] [address], [place], [place] [address]  Acid Fast Smear (AFB)     Status: None   Collection Time: 05/11/20 11:41  AM   Specimen: Sputum  Result Value Ref Range   AFB Specimen Processing Concentration    Acid Fast Smear Negative     Comment: (NOTE) Performed At: [facility] [place], [place] [id number] Rush Farmer MD [id number]    Source (AFB) SPUTUM     Comment: Performed at [facility], [place] [address] [place], [place] [address]  Culture, respiratory     Status: None   Collection Time: 05/11/20 11:41 AM  Result Value Ref Range   Specimen Description EXPECTORATED SPUTUM    Special Requests NONE Reflexed from [id number]    Gram Stain      ABUNDANT WBC PRESENT, PREDOMINANTLY PMN ABUNDANT GRAM POSITIVE COCCI FEW GRAM POSITIVE RODS    Culture      Consistent with normal respiratory flora. No Pseudomonas species isolated Performed at [facility] [address], [place], [place] [address]    Report Status 05/14/2020 FINAL   Fungus Culture Result     Status: None   Collection Time: 05/11/20 11:41 AM  Result Value Ref Range   Result 1 Comment     Comment: (NOTE) KOH/Calcofluor preparation:  no fungus observed. Performed At: [facility] [place], [place] [id number] Rush Farmer MD [id number]   Fungal organism reflex     Status: None   Collection Time: 05/11/20 11:41 AM  Result Value Ref Range   Fungal result 1 Comment     Comment: (NOTE) No yeast or mold isolated after 4 weeks. Performed At: [facility] [place], [place] [id number] Rush Farmer MD [id number]   CBC with Differential/Platelet     Status: Abnormal   Collection Time: 05/28/20 12:00 AM  Result Value Ref Range   WBC 13.1 (H) 3.8 - 10.8 Thousand/uL   RBC 3.39 (L) 4.20 - 5.80 Million/uL   Hemoglobin 8.7 (L) 13.2 - 17.1 g/dL   HCT 27.5 (L) 38.5 -  50.0 %   MCV 81.1 80.0 - 100.0 fL   MCH 25.7 (L) 27.0 - 33.0 pg   MCHC 31.6 (L) 32.0 - 36.0 g/dL   RDW 15.9 (H) 11.0 - 15.0 %   Platelets 676 (H) 140 - 400 Thousand/uL   MPV 8.4 7.5 - 12.5 fL   Neutro Abs 11,502 (H) 1,500 - 7,800 cells/uL   Lymphs Abs 655 (L) 850 - 3,900 cells/uL   Absolute Monocytes 852 200 - 950 cells/uL   Eosinophils Absolute 66 15 - 500 cells/uL   Basophils Absolute 26 0 - 200 cells/uL   Neutrophils Relative % 87.8 %   Total Lymphocyte 5.0 %   Monocytes Relative 6.5 %   Eosinophils Relative 0.5 %   Basophils Relative 0.2 %  POCT URINALYSIS DIP (CLINITEK)     Status: Abnormal   Collection Time: 06/24/20  2:51 PM  Result Value Ref Range   Color, UA brown (A) yellow   Clarity, UA cloudy (A) clear   Glucose, UA negative negative mg/dL   Bilirubin, UA negative negative   Ketones, POC UA negative negative mg/dL   Spec Grav, UA 1.015 1.010 - 1.025   Blood, UA large (A) negative   pH, UA 7.0 5.0 - 8.0   POC PROTEIN,UA >=300 (A) negative, trace   Urobilinogen, UA 0.2 0.2 or 1.0 E.U./dL   Nitrite, UA Negative Negative   Leukocytes, UA Small (1+) (A) Negative    No results found.     All questions at time of visit were answered -  patient instructed to contact office with any additional concerns or updates. ER/RTC precautions were reviewed with the patient as applicable.   Please note: manual typing as well as voice recognition software may have been used to produce this document - typos may escape review. Please contact Dr. Sheppard Coil for any needed clarifications.   Total encounter time on date of service, 06/24/20, was 40  minutes spent addressing problems/issues as noted above in [place], including time spent in discussion with patient regarding the HPI, ROS, confirming history, reviewing Assessment & Plan, as well as time spent on coordination of care, record review.

## 2020-06-24 NOTE — Patient Instructions (Signed)
Not sure what might be the cause We are looking for potential infection, prostate problem, kidney problem, worst case would be a cancer problem.  I should have most results back by tomorrow or the day after. We will call you / message you with updates.

## 2020-06-24 NOTE — Addendum Note (Signed)
Addended by: Maryla Morrow on: 06/24/2020 05:25 PM   Modules accepted: Orders

## 2020-06-24 NOTE — Progress Notes (Signed)
Richard Li is a 60 y.o. male who presents to  [facility]  today, 06/24/20, seeking care for the following:  Gross hematuria   Context: lung CA, recent PNA d/t obstructive mass. Anemia, last Hgb 05/28/20 (3 weeks ago) was 8.7. PET 12/2019 findings c/w known lung CA, otherwise in abdomen "3. No hypermetabolic thoracic adenopathy or extrathoracic or skeletal metastatic disease. 4. Nonspecific nonfocal heterogeneous prostate hypermetabolism. Suggest correlation with serum PSA." Last PSA 0.5 was 02/2019  Onset this AM, dark red urine, later went and it was amber, this void in office as quite red. Nonpainful. No N/V, no new/worse dizziness, no rash. No increased frequency/dysuria. No fever.        ASSESSMENT & PLAN with other pertinent findings:  The primary encounter diagnosis was Gross hematuria. A diagnosis of Adenocarcinoma of right lung, stage 2 (HCC) was also pertinent to this visit.   Ddx: UTi seems unlikely given on other symptoms, prostate CA shouldn't cause this unless erosion through into urinary tract, uncertain probability but metastatic disease certainly possible, uncertain to what extent nephrotoxicity of recent chemotherapy might be playing a role here. Pt also already anemic, depending on Hgb may need referral for transfusion. CT and labs today to evaluate. Suspect either way will need nephrology referral id urinary tract or renal or prostate pathology is found, and if no etiology obvious form w/u will probably need cystoscopy.   Patient Instructions  Not sure what might be the cause We are looking for potential infection, prostate problem, kidney problem, worst case would be a cancer problem.  I should have most results back by tomorrow or the day after. We will call you / message you with updates.      Orders Placed This Encounter  Procedures  . Urine Culture  . CT ABDOMEN PELVIS W CONTRAST  . CBC with  Differential/Platelet  . COMPLETE METABOLIC PANEL WITH GFR  . TSH  . PSA, Total with Reflex to PSA, Free  . Urinalysis, Routine w reflex microscopic  . Ambulatory referral to Urology  . POCT URINALYSIS DIP (CLINITEK)    No orders of the defined types were placed in this encounter.    See below for relevant physical exam findings  See below for recent lab and imaging results reviewed  Medications, allergies, PMH, PSH, SocH, FamH reviewed below    Follow-up instructions: Return for RECHECK PENDING RESULTS / IF WORSE OR CHANGE.                                        Exam:  BP 126/71 (BP Location: Left Arm, Patient Position: Sitting, Cuff Size: Normal)   Pulse 97   Temp 98 F (36.7 C) (Oral)   Wt 164 lb 1.9 oz (74.4 kg)   BMI 23.55 kg/m   Constitutional: VS see above. General Appearance: alert, well-developed, well-nourished, NAD  Neck: No masses, trachea midline.   Respiratory: Normal respiratory effort. no wheeze, no rhonchi, no rales  Cardiovascular: S1/S2 normal, no murmur, no rub/gallop auscultated. RRR.   Musculoskeletal: Gait normal. Symmetric and independent movement of all extremities. No CVA tenderness   Abdominal: non-tender, non-distended, no appreciable organomegaly, neg Murphy's, BS WNLx4  Neurological: Normal balance/coordination. No tremor.  Skin: warm, dry, intact.   Psychiatric: Normal judgment/insight. Normal mood and affect. Oriented x3.   Current Meds  Medication Sig  .  acetaminophen (TYLENOL) 500 MG tablet Take 1,000 mg by mouth 2 (two) times daily.  [name] dextromethorphan-guaiFENesin (MUCINEX DM) 30-600 MG 12hr tablet Take 1 tablet by mouth 2 (two) times daily.  [name] doxycycline (VIBRA-TABS) 100 MG tablet Take 1 tablet (100 mg total) by mouth 2 (two) times daily.  [name] doxycycline (VIBRA-TABS) 100 MG tablet Take 1 tablet by mouth twice daily  . feeding supplement (ENSURE ENLIVE / ENSURE PLUS) LIQD Take 237 mLs by  mouth 3 (three) times daily between meals.  . fenofibrate 160 MG tablet Take 1 tablet by mouth once daily  . ferrous sulfate 325 (65 FE) MG tablet Take 1 tablet (325 mg total) by mouth daily with breakfast.  . folic acid (FOLVITE) 1 MG tablet Take 1 tablet (1 mg total) by mouth daily.  . Ipratropium-Albuterol (COMBIVENT RESPIMAT) 20-100 MCG/ACT AERS respimat Inhale 1-2 puffs into the lungs every 6 (six) hours as needed for wheezing.  [name] LORazepam (ATIVAN) 1 MG tablet TAKE 1 TABLET BY MOUTH THREE TIMES DAILY AS NEEDED FOR ANXIETY (Patient taking differently: Take 1 mg by mouth See admin instructions. Take one tablet (1 mg) by mouth daily at bedtime, may also take one tablet (1 mg) twice during the day as needed for anxiety)  . meclizine (ANTIVERT) 25 MG tablet Take 1 tablet (25 mg total) by mouth every 6 (six) hours as needed for dizziness.  . Multiple Vitamin (MULTIVITAMIN WITH MINERALS) TABS tablet Take 1 tablet by mouth daily.  . predniSONE (DELTASONE) 10 MG tablet Take 1 tablet (10 mg total) by mouth daily with breakfast.  . predniSONE (DELTASONE) 10 MG tablet Take 2 tablets (20 mg total) by mouth daily with breakfast for 7 days, THEN 1.5 tablets (15 mg total) daily with breakfast for 7 days, THEN 1 tablet (10 mg total) daily with breakfast for 28 days.  . prochlorperazine (COMPAZINE) 10 MG tablet TAKE 1 TABLET BY MOUTH EVERY 6 HOURS AS NEEDED FOR NAUSEA FOR VOMITING (Patient taking differently: Take 10 mg by mouth every 6 (six) hours as needed for nausea or vomiting.)    Allergies  Allergen Reactions  . Amoxicillin Rash    Patient Active Problem List   Diagnosis Date Noted  . Multifocal pneumonia   . Iron deficiency anemia   . Leukocytosis   . Hypotension   . Recurrent pneumonia 05/03/2020  . Protein-calorie malnutrition, severe 04/07/2020  . Sepsis due to pneumonia ([place]) 04/04/2020  . Symptomatic anemia 03/20/2020  . Volume overload 03/20/2020  . Sepsis ([place]) 03/13/2020  . SOB  (shortness of breath) 03/06/2020  . COPD with acute exacerbation ([place]) 03/06/2020  . AKI (acute kidney injury) ([place]) 03/06/2020  . Dehydration 03/05/2020  . PNA (pneumonia) 03/05/2020  . Encounter for antineoplastic chemotherapy 01/30/2020  . Sinus congestion 01/22/2020  . Adenocarcinoma of right lung, stage 2 ([place]) 01/17/2020  . Pulmonary nodules/lesions, multiple 01/07/2020  . 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 12/21/2019  . Thoracic aortic atherosclerosis ([place]) 12/21/2019  . Erectile dysfunction 11/06/2019  . Fatigue 10/16/2019  . Insomnia 09/10/2019  . BPPV (benign paroxysmal positional vertigo) 08/03/2017  . Physical exam, annual 03/30/2015  . Essential hypertension 06/03/2014  . Kidney stone 12/25/2012  . GERD 01/27/2010  . Hyperlipidemia 12/11/2009  . GAD (generalized anxiety disorder) 11/17/2007  . Anxiety state 11/17/2007  . Allergic rhinitis 02/26/2007  . 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 ABNORMAL CHEST XRAY 02/26/2007    Family History  Problem Relation Age of Onset  .  Breast cancer Mother   . Cancer Father   . Heart attack Brother   . Emphysema Maternal Uncle   . COPD Maternal Uncle   . Heart disease Maternal Uncle   . Colon cancer Neg Hx     Social History   Tobacco Use  Smoking Status Former Smoker  . Packs/day: 2.00  . Years: 28.00  . Pack years: 56.00  . Types: Cigarettes  . Quit date: 04/25/2005  . Years since quitting: 15.1  Smokeless Tobacco Never Used    Past Surgical History:  Procedure Laterality Date  . APPENDECTOMY    . BRONCHIAL BIOPSY  01/07/2020   Procedure: BRONCHIAL BIOPSIES;  Surgeon: Collene Gobble, MD;  Location: [facility] ENDOSCOPY;  Service: Pulmonary;;  . BRONCHIAL BIOPSY  04/05/2020   Procedure: BRONCHIAL BIOPSIES;  Surgeon: Rigoberto Noel, MD;  Location: [place];  Service: Cardiopulmonary;;  . BRONCHIAL BRUSHINGS  01/07/2020   Procedure: BRONCHIAL BRUSHINGS;   Surgeon: Collene Gobble, MD;  Location: [facility] ENDOSCOPY;  Service: Pulmonary;;  . BRONCHIAL NEEDLE ASPIRATION BIOPSY  01/07/2020   Procedure: BRONCHIAL NEEDLE ASPIRATION BIOPSIES;  Surgeon: Collene Gobble, MD;  Location: [facility] ENDOSCOPY;  Service: Pulmonary;;  . BRONCHIAL WASHINGS  01/07/2020   Procedure: BRONCHIAL WASHINGS;  Surgeon: Collene Gobble, MD;  Location[facility] ENDOSCOPY;  Service: Pulmonary;;  . BRONCHIAL WASHINGS  04/05/2020   Procedure: BRONCHIAL WASHINGS;  Surgeon: Rigoberto Noel, MD;  Location: [place];  Service: Cardiopulmonary;;  . COLONOSCOPY  15 years ago  . HEMOSTASIS CONTROL  01/07/2020   Procedure: HEMOSTASIS CONTROL;  Surgeon: Collene Gobble, MD;  Location: [facility] ENDOSCOPY;  Service: Pulmonary;;  cold saline  . NASAL TURBINATE REDUCTION  2002   Dr.Crossley  . VASECTOMY    . VIDEO BRONCHOSCOPY Right 04/05/2020   Procedure: VIDEO BRONCHOSCOPY WITH FLUORO;  Surgeon: Rigoberto Noel, MD;  Location: [place];  Service: Cardiopulmonary;  Laterality: Right;  [name] VIDEO BRONCHOSCOPY WITH ENDOBRONCHIAL NAVIGATION N/A 01/07/2020   Procedure: VIDEO BRONCHOSCOPY WITH ENDOBRONCHIAL NAVIGATION;  Surgeon: Collene Gobble, MD;  Location: [place] ENDOSCOPY;  Service: Pulmonary;  Laterality: N/A;  . VIDEO BRONCHOSCOPY WITH ENDOBRONCHIAL ULTRASOUND N/A 01/07/2020   Procedure: VIDEO BRONCHOSCOPY WITH ENDOBRONCHIAL ULTRASOUND;  Surgeon: Collene Gobble, MD;  Location: [place] ENDOSCOPY;  Service: Pulmonary;  Laterality: N/A;    Immunization History  Administered Date(s) Administered  . Influenza Split 01/25/2012  . Influenza Whole 02/13/2009, 01/25/2010  . Influenza,inj,Quad PF,6+ Mos 03/30/2015, 02/16/2016, 02/27/2017, 01/24/2018, 03/01/2019, 02/10/2020  . Influenza,inj,quad, With Preservative 01/23/2017  . Tdap 12/12/2011    Recent Results (from the past 2160 hour(s))  Basic metabolic panel     Status: Abnormal   Collection Time: 04/04/20 10:40 AM  Result Value Ref Range   Sodium 134 (L) 135 -  145 mmol/L   Potassium 3.7 3.5 - 5.1 mmol/L   Chloride 96 (L) 98 - 111 mmol/L   CO2 25 22 - 32 mmol/L   Glucose, Bld 133 (H) 70 - 99 mg/dL    Comment: Glucose reference range applies only to samples taken after fasting for at least 8 hours.   BUN 19 6 - 20 mg/dL   Creatinine, Ser 1.16 0.61 - 1.24 mg/dL   Calcium 9.3 8.9 - 10.3 mg/dL   GFR, Estimated >60 >60 mL/min    Comment: (NOTE) Calculated using the CKD-EPI Creatinine Equation (2021)    Anion gap 13 5 - 15    Comment: Performed at [facility] [address]  [place], [place] [address]  CBC     Status: Abnormal   Collection Time: 04/04/20 10:40 AM  Result Value Ref Range   WBC 17.1 (H) 4.0 - 10.5 K/uL   RBC 3.22 (L) 4.22 - 5.81 MIL/uL   Hemoglobin 9.5 (L) 13.0 - 17.0 g/dL   HCT 28.8 (L) 39.0 - 52.0 %   MCV 89.4 80.0 - 100.0 fL   MCH 29.5 26.0 - 34.0 pg   MCHC 33.0 30.0 - 36.0 g/dL   RDW 13.6 11.5 - 15.5 %   Platelets 853 (H) 150 - 400 K/uL   nRBC 0.0 0.0 - 0.2 %    Comment: Performed at [facility] [address] [place], [place] [address]  Troponin I (High Sensitivity)     Status: None   Collection Time: 04/04/20 10:40 AM  Result Value Ref Range   Troponin I (High Sensitivity) 6 <18 ng/L    Comment: (NOTE) Elevated high sensitivity troponin I (hsTnI) values and significant  changes across serial measurements may suggest ACS but many other  chronic and acute conditions are known to elevate hsTnI results.  Refer to the "Links" section for chest pain algorithms and additional  guidance. Performed at [facility], [place] [address] [place], [place] [address]   Lactic acid, plasma     Status: Abnormal   Collection Time: 04/04/20 10:41 AM  Result Value Ref Range   Lactic Acid, Venous 3.2 (HH) 0.5 - 1.9 mmol/L    Comment: CRITICAL RESULT CALLED TO, READ BACK BY AND VERIFIED WITH: PETERS,M RN @ 1146 04/04/20 LEONARD,A Performed at [facility], [place] [address] [place], [place] [address]   Blood  Culture (routine x 2)     Status: None   Collection Time: 04/04/20 10:56 AM   Specimen: BLOOD  Result Value Ref Range   Specimen Description BLOOD BLOOD RIGHT HAND    Special Requests      BOTTLES DRAWN AEROBIC AND ANAEROBIC Blood Culture adequate volume   Culture      NO GROWTH 6 DAYS Performed at [facility], [place] [address] [place], [place] [address]    Report Status 04/10/2020 FINAL   Blood Culture (routine x 2)     Status: None   Collection Time: 04/04/20 11:12 AM   Specimen: BLOOD  Result Value Ref Range   Specimen Description BLOOD RIGHT ANTECUBITAL    Special Requests      BOTTLES DRAWN AEROBIC AND ANAEROBIC Blood Culture adequate volume   Culture      NO GROWTH 6 DAYS Performed at [facility], [place] [address], [place], [place] [address]    Report Status 04/10/2020 FINAL   Resp Panel by RT-PCR (Flu A&B, Covid) Nasopharyngeal Swab     Status: None   Collection Time: 04/04/20 11:13 AM   Specimen: Nasopharyngeal Swab; Nasopharyngeal(NP) swabs in vial transport medium  Result Value Ref Range   SARS Coronavirus 2 by RT PCR NEGATIVE NEGATIVE    Comment: (NOTE) SARS-CoV-2 target nucleic acids are NOT DETECTED.  The SARS-CoV-2 RNA is generally detectable in upper respiratory specimens during the acute phase of infection. The lowest concentration of SARS-CoV-2 viral copies this assay can detect is 138 copies/mL. A negative result does not preclude SARS-Cov-2 infection and should not be used as the sole basis for treatment or other patient management decisions. A negative result may occur with  improper specimen collection/handling, submission of specimen other than nasopharyngeal swab, presence of viral  mutation(s) within the areas targeted by this assay, and inadequate number of viral copies(<138 copies/mL). A negative result must be combined with clinical observations, patient history, and epidemiological information. The expected result is Negative.  Fact  Sheet for Patients:  EntrepreneurPulse.com.au  Fact Sheet for Healthcare Providers:  IncredibleEmployment.be  This test is no t yet approved or cleared by the [place] FDA and  has been authorized for detection and/or diagnosis of SARS-CoV-2 by FDA under an Emergency Use Authorization (EUA). This EUA will remain  in effect (meaning this test can be used) for the duration of the COVID-19 declaration under Section 564(b)(1) of the Act, 21 U.S.C.section 360bbb-3(b)(1), unless the authorization is terminated  or revoked sooner.       Influenza A by PCR NEGATIVE NEGATIVE   Influenza B by PCR NEGATIVE NEGATIVE    Comment: (NOTE) The Xpert Xpress SARS-CoV-2/FLU/RSV plus assay is intended as an aid in the diagnosis of influenza from Nasopharyngeal swab specimens and should not be used as a sole basis for treatment. Nasal washings and aspirates are unacceptable for Xpert Xpress SARS-CoV-2/FLU/RSV testing.  Fact Sheet for Patients: EntrepreneurPulse.com.au  Fact Sheet for Healthcare Providers: IncredibleEmployment.be  This test is not yet approved or cleared by the [place] FDA and has been authorized for detection and/or diagnosis of SARS-CoV-2 by FDA under an Emergency Use Authorization (EUA). This EUA will remain in effect (meaning this test can be used) for the duration of the COVID-19 declaration under Section 564(b)(1) of the Act, 21 U.S.C. section 360bbb-3(b)(1), unless the authorization is terminated or revoked.  Performed at [facility], [place] [address] [place], [place] [address]   Troponin I (High Sensitivity)     Status: None   Collection Time: 04/04/20 12:54 PM  Result Value Ref Range   Troponin I (High Sensitivity) 4 <18 ng/L    Comment: (NOTE) Elevated high sensitivity troponin I (hsTnI) values and significant  changes across serial measurements may suggest ACS but many other   chronic and acute conditions are known to elevate hsTnI results.  Refer to the "Links" section for chest pain algorithms and additional  guidance. Performed at [facility], [place] [address] [place], [place] [address]   Protime-INR     Status: None   Collection Time: 04/04/20 12:54 PM  Result Value Ref Range   Prothrombin Time 15.0 11.4 - 15.2 seconds   INR 1.2 0.8 - 1.2    Comment: (NOTE) INR goal varies based on device and disease states. Performed at [facility], [place] [address], [place], [place] [address]   APTT     Status: Abnormal   Collection Time: 04/04/20 12:54 PM  Result Value Ref Range   aPTT 38 (H) 24 - 36 seconds    Comment:        IF BASELINE aPTT IS ELEVATED, SUGGEST PATIENT RISK ASSESSMENT BE USED TO DETERMINE APPROPRIATE ANTICOAGULANT THERAPY. Performed at [facility], [place] [address], [place], [place] [address]   Lactic acid, plasma     Status: None   Collection Time: 04/04/20  7:13 PM  Result Value Ref Range   Lactic Acid, Venous 1.4 0.5 - 1.9 mmol/L    Comment: Performed at [facility] [address], [place], [place] [address]  CBC     Status: Abnormal   Collection Time: 04/04/20  7:13 PM  Result Value Ref Range   WBC 14.3 (H) 4.0 - 10.5 K/uL   RBC 2.87 (  L) 4.22 - 5.81 MIL/uL   Hemoglobin 8.2 (L) 13.0 - 17.0 g/dL   HCT 25.4 (L) 39.0 - 52.0 %   MCV 88.5 80.0 - 100.0 fL   MCH 28.6 26.0 - 34.0 pg   MCHC 32.3 30.0 - 36.0 g/dL   RDW 13.4 11.5 - 15.5 %   Platelets 608 (H) 150 - 400 K/uL   nRBC 0.0 0.0 - 0.2 %    Comment: Performed at [facility] [address] [place], [place] [address]  Creatinine, serum     Status: None   Collection Time: 04/04/20  7:13 PM  Result Value Ref Range   Creatinine, Ser 0.84 0.61 - 1.24 mg/dL   GFR, Estimated >60 >60 mL/min    Comment: (NOTE) Calculated using the CKD-EPI Creatinine Equation (2021) Performed at [facility] [address] [place], [place] [address]    Urinalysis, Routine w reflex microscopic     Status: None   Collection Time: 04/04/20 10:17 PM  Result Value Ref Range   Color, Urine YELLOW YELLOW   APPearance CLEAR CLEAR   Specific Gravity, Urine 1.006 1.005 - 1.030   pH 6.0 5.0 - 8.0   Glucose, UA NEGATIVE NEGATIVE mg/dL   Hgb urine dipstick NEGATIVE NEGATIVE   Bilirubin Urine NEGATIVE NEGATIVE   Ketones, ur NEGATIVE NEGATIVE mg/dL   Protein, ur NEGATIVE NEGATIVE mg/dL   Nitrite NEGATIVE NEGATIVE   Leukocytes,Ua NEGATIVE NEGATIVE    Comment: Performed at [facility] [address], [place], [place] [address]  Urine culture     Status: None   Collection Time: 04/04/20 10:17 PM   Specimen: In/Out Cath Urine  Result Value Ref Range   Specimen Description IN/OUT CATH URINE    Special Requests NONE    Culture      NO GROWTH Performed at [facility], [place] [address], [place], [place] [address]    Report Status 04/06/2020 FINAL   MRSA PCR Screening     Status: None   Collection Time: 04/04/20 10:17 PM   Specimen: Nasopharyngeal  Result Value Ref Range   MRSA by PCR NEGATIVE NEGATIVE    Comment:        The GeneXpert MRSA Assay (FDA approved for NASAL specimens only), is one component of a comprehensive MRSA colonization surveillance program. It is not intended to diagnose MRSA infection nor to guide or monitor treatment for MRSA infections. Performed at [facility], [place] [address] [place], [place] [address]   Basic metabolic panel     Status: Abnormal   Collection Time: 04/05/20  2:18 AM  Result Value Ref Range   Sodium 133 (L) 135 - 145 mmol/L   Potassium 5.4 (H) 3.5 - 5.1 mmol/L   Chloride 101 98 - 111 mmol/L   CO2 20 (L) 22 - 32 mmol/L   Glucose, Bld 138 (H) 70 - 99 mg/dL    Comment: Glucose reference range applies only to samples taken after fasting for at least 8 hours.   BUN 15 6 - 20 mg/dL   Creatinine, Ser 0.70 0.61 - 1.24 mg/dL   Calcium 8.4 (L) 8.9 - 10.3 mg/dL   GFR, Estimated >60  >60 mL/min    Comment: (NOTE) Calculated using the CKD-EPI Creatinine Equation (2021)    Anion gap 12 5 - 15    Comment: Performed at [facility] [address] [place], [place] [address]  CBC     Status: Abnormal  Collection Time: 04/05/20  2:18 AM  Result Value Ref Range   WBC 9.9 4.0 - 10.5 K/uL   RBC 2.38 (L) 4.22 - 5.81 MIL/uL   Hemoglobin 7.0 (L) 13.0 - 17.0 g/dL    Comment: REPEATED TO VERIFY   HCT 20.8 (L) 39.0 - 52.0 %   MCV 87.4 80.0 - 100.0 fL   MCH 29.4 26.0 - 34.0 pg   MCHC 33.7 30.0 - 36.0 g/dL   RDW 13.7 11.5 - 15.5 %   Platelets 480 (H) 150 - 400 K/uL   nRBC 0.0 0.0 - 0.2 %    Comment: Performed at [facility] [address], [place], [place] [address]  Type and screen [place]     Status: None   Collection Time: 04/05/20  7:41 AM  Result Value Ref Range   ABO/RH(D) O POS    Antibody Screen NEG    Sample Expiration 04/08/2020,2359    Unit Number [id number]    Blood Component Type RED CELLS,LR    Unit division 00    Status of Unit ISSUED,FINAL    Transfusion Status OK TO TRANSFUSE    Crossmatch Result      Compatible Performed at [facility], [place] [address], [place], [place] [address]   Vitamin B12     Status: None   Collection Time: 04/05/20  7:41 AM  Result Value Ref Range   Vitamin B-12 225 180 - 914 pg/mL    Comment: (NOTE) This assay is not validated for testing neonatal or myeloproliferative syndrome specimens for Vitamin B12 levels. Performed at [facility], [place] [address] [place], [place] [address]   Folate     Status: None   Collection Time: 04/05/20  7:41 AM  Result Value Ref Range   Folate 10.4 >5.9 ng/mL    Comment: Performed at [facility] [address], [place], [place] [address]  Iron and TIBC     Status: Abnormal   Collection Time: 04/05/20  7:41 AM  Result Value Ref Range   Iron 21 (L) 45 - 182 ug/dL   TIBC 98 (L) 250 - 450 ug/dL   Saturation Ratios 21 17.9 - 39.5 %    UIBC 77 ug/dL    Comment: Performed at [facility], [place] [address], [place], [place] [address]  Ferritin     Status: Abnormal   Collection Time: 04/05/20  7:41 AM  Result Value Ref Range   Ferritin 853 (H) 24 - 336 ng/mL    Comment: Performed at [facility], [place] [address], [place], [place] [address]  Basic metabolic panel     Status: Abnormal   Collection Time: 04/05/20  7:41 AM  Result Value Ref Range   Sodium 136 135 - 145 mmol/L   Potassium 4.1 3.5 - 5.1 mmol/L    Comment: DELTA CHECK NOTED   Chloride 102 98 - 111 mmol/L   CO2 22 22 - 32 mmol/L   Glucose, Bld 98 70 - 99 mg/dL    Comment: Glucose reference range applies only to samples taken after fasting for at least 8 hours.   BUN 12 6 - 20 mg/dL   Creatinine, Ser 0.57 (L) 0.61 - 1.24 mg/dL   Calcium 8.2 (L) 8.9 - 10.3 mg/dL   GFR, Estimated >60 >60 mL/min    Comment: (NOTE) Calculated using the CKD-EPI Creatinine Equation (2021)    Anion gap 12 5 - 15  Comment: Performed at [facility], [place] [address], [place], [place] [address]  Prepare RBC (crossmatch)     Status: None   Collection Time: 04/05/20  7:41 AM  Result Value Ref Range   Order Confirmation      ORDER PROCESSED BY BLOOD BANK Performed at [facility], [place] [address] [place], [place] [address]   BPAM RBC     Status: None   Collection Time: 04/05/20  7:41 AM  Result Value Ref Range   ISSUE DATE / TIME [id number]    Blood Product Unit Number [id number]    PRODUCT CODE [id number]    Unit Type and Rh 5100    Blood Product Expiration Date [id number]   Cytology - Non PAP;     Status: None   Collection Time: 04/05/20 11:26 AM  Result Value Ref Range   CYTOLOGY - NON GYN      CYTOLOGY - NON PAP CASE: [id number] PATIENT: Clint Lipps Non-Gynecological Cytology Report     Clinical History: Pneumonia Specimen Submitted:  A. LUNG, RIGHT, BRONCHIAL WASHING:   FINAL MICROSCOPIC DIAGNOSIS: - No malignant cells  identified  SPECIMEN ADEQUACY: Satisfactory for evaluation  GROSS: Received is/are 5 cc's of light red fluid with mucoid material (GW:gw) Smears: 2 Concentration Method (Thin Prep): 0 Cell Block: 0 Additional Studies: N/A     Final Diagnosis performed by Gillie Manners, MD.   Electronically signed 04/07/2020 Technical and / or Professional components performed at [employer]. [facility], [place] [address], [place], [place] [address].  Immunohistochemistry Technical component (if applicable) was performed at [facility]. [address] [place], [place], [place] [address].   IMMUNOHISTOCHEMISTRY DISCLAIMER (if applicable): Some of these immunohistochemical stains may have been developed and the  performance characteristics determine by [facility]. Some may not have been cleared or approved by the U.S. Food and Drug Administration. The FDA has determined that such clearance or approval is not necessary. This test is used for clinical purposes. It should not be regarded as investigational or for research. This laboratory is certified under the [place] (CLIA-88) as qualified to perform high complexity clinical laboratory testing.  The controls stained appropriately.   Culture, fungus without smear     Status: None   Collection Time: 04/05/20 11:26 AM   Specimen: Bronchial Washing, Right; Lung  Result Value Ref Range   Specimen Description BRONCHIAL ALVEOLAR LAVAGE    Special Requests BRONCH [place]    Culture      NO FUNGUS ISOLATED AFTER 21 DAYS Performed at [facility], [place] [address], [place], [place] [address]    Report Status 04/26/2020 FINAL   Culture, respiratory     Status: None   Collection Time: 04/05/20 11:26 AM   Specimen: Bronchial Washing, Right; Lung  Result Value Ref Range   Specimen Description BRONCHIAL ALVEOLAR LAVAGE    Special Requests [place] RT    Gram Stain       ABUNDANT WBC PRESENT,BOTH PMN AND MONONUCLEAR RARE GRAM POSITIVE COCCI IN PAIRS    Culture      RARE Normal respiratory flora-no Staph aureus or Pseudomonas seen Performed at [facility] [address] [place], [place] [address]    Report Status 04/07/2020 FINAL   Acid Fast Culture with reflexed sensitivities     Status: None   Collection Time: 04/05/20 11:26 AM   Specimen: Bronchial Washing, Right; Lung  Result Value Ref Range  Acid Fast Culture Negative     Comment: (NOTE) No acid fast bacilli isolated after 6 weeks. Performed At: [facility] [place], [place] [id number] Rush Farmer MD QP:[id number]    Source of Sample BRONCHIAL ALVEOLAR LAVAGE     Comment: Performed at [facility], [place] [address], [place], [place] [address]  Acid Fast Smear (AFB)     Status: None   Collection Time: 04/05/20 11:26 AM   Specimen: Bronchial Washing, Right; Lung  Result Value Ref Range   AFB Specimen Processing Concentration    Acid Fast Smear Negative     Comment: (NOTE) Performed At: [facility] [place], [place] [id number] Rush Farmer MD KN:[id number]    Source (AFB) BRONCHIAL ALVEOLAR LAVAGE     Comment: Performed at [facility], [place] [address], [place], [place] [address]  Surgical pathology     Status: None   Collection Time: 04/05/20 11:29 AM  Result Value Ref Range   SURGICAL PATHOLOGY      SURGICAL PATHOLOGY CASE: [id number] PATIENT: Clint Lipps Surgical Pathology Report     Clinical History: Pneumonia (cm)     FINAL MICROSCOPIC DIAGNOSIS:  A. BRONCHUS, RIGHT LOWER LOBE, BIOPSY: - Benign bronchial wall and scant lung parenchyma with mild reactive changes. - No malignancy identified.   COMMENT:  Dr. Vic Ripper reviewed.  GROSS DESCRIPTION:  Received in formalin are multiple tan soft tissues, 0.3 x 0.2 x 0.2 cm, in toto 1 block.  [facility] 04/06/2020)   Final Diagnosis performed by  Gillie Manners, MD.   Electronically signed 04/07/2020 Technical and / or Professional components performed at [facility]. [facility], [place] [address] [place], [place] [address].  Immunohistochemistry Technical component (if applicable) was performed at [facility]. [address] [place], [place], [place] [address].   IMMUNOHISTOCHEMISTRY DISCLAIMER (if applicable): Some of these immunohistochemical stains may have been developed  and the performance characteristics determine by [facility]. Some may not have been cleared or approved by the U.S. Food and Drug Administration. The FDA has determined that such clearance or approval is not necessary. This test is used for clinical purposes. It should not be regarded as investigational or for research. This laboratory is certified under the [place] (CLIA-88) as qualified to perform high complexity clinical laboratory testing.  The controls stained appropriately.   Hemoglobin and hematocrit, blood     Status: Abnormal   Collection Time: 04/05/20  8:16 PM  Result Value Ref Range   Hemoglobin 9.2 (L) 13.0 - 17.0 g/dL    Comment: REPEATED TO VERIFY POST TRANSFUSION SPECIMEN    HCT 28.4 (L) 39.0 - 52.0 %    Comment: Performed at [facility] [address], [place], [place] [address]  Basic metabolic panel     Status: Abnormal   Collection Time: 04/06/20  8:34 AM  Result Value Ref Range   Sodium 137 135 - 145 mmol/L   Potassium 3.9 3.5 - 5.1 mmol/L   Chloride 101 98 - 111 mmol/L   CO2 26 22 - 32 mmol/L   Glucose, Bld 117 (H) 70 - 99 mg/dL    Comment: Glucose reference range applies only to samples taken after fasting for at least 8 hours.   BUN 11 6 - 20 mg/dL   Creatinine, Ser 0.62 0.61 - 1.24 mg/dL   Calcium 8.7 (L) 8.9 - 10.3 mg/dL   GFR, Estimated >60 >60 mL/min  Comment: (NOTE) Calculated using the CKD-EPI Creatinine Equation (2021)     Anion gap 10 5 - 15    Comment: Performed at [facility], [place] [address], [place], [place] [address]  CBC with Differential/Platelet     Status: Abnormal   Collection Time: 04/06/20  8:34 AM  Result Value Ref Range   WBC 10.3 4.0 - 10.5 K/uL   RBC 2.92 (L) 4.22 - 5.81 MIL/uL   Hemoglobin 8.4 (L) 13.0 - 17.0 g/dL   HCT 25.3 (L) 39.0 - 52.0 %   MCV 86.6 80.0 - 100.0 fL   MCH 28.8 26.0 - 34.0 pg   MCHC 33.2 30.0 - 36.0 g/dL   RDW 13.8 11.5 - 15.5 %   Platelets 484 (H) 150 - 400 K/uL   nRBC 0.0 0.0 - 0.2 %   Neutrophils Relative % 92 %   Neutro Abs 9.4 (H) 1.7 - 7.7 K/uL   Lymphocytes Relative 3 %   Lymphs Abs 0.3 (L) 0.7 - 4.0 K/uL   Monocytes Relative 4 %   Monocytes Absolute 0.4 0.1 - 1.0 K/uL   Eosinophils Relative 0 %   Eosinophils Absolute 0.0 0.0 - 0.5 K/uL   Basophils Relative 0 %   Basophils Absolute 0.0 0.0 - 0.1 K/uL   Immature Granulocytes 1 %   Abs Immature Granulocytes 0.09 (H) 0.00 - 0.07 K/uL    Comment: Performed at [facility] [address], [place], [place] [address]  Basic metabolic panel     Status: Abnormal   Collection Time: 04/07/20  1:34 AM  Result Value Ref Range   Sodium 136 135 - 145 mmol/L   Potassium 3.7 3.5 - 5.1 mmol/L   Chloride 101 98 - 111 mmol/L   CO2 26 22 - 32 mmol/L   Glucose, Bld 147 (H) 70 - 99 mg/dL    Comment: Glucose reference range applies only to samples taken after fasting for at least 8 hours.   BUN 11 6 - 20 mg/dL   Creatinine, Ser 0.64 0.61 - 1.24 mg/dL   Calcium 8.7 (L) 8.9 - 10.3 mg/dL   GFR, Estimated >60 >60 mL/min    Comment: (NOTE) Calculated using the CKD-EPI Creatinine Equation (2021)    Anion gap 9 5 - 15    Comment: Performed at [facility] [address], [place], [place] [address]  CBC with Differential/Platelet     Status: Abnormal   Collection Time: 04/07/20  1:34 AM  Result Value Ref Range   WBC 10.3 4.0 - 10.5 K/uL   RBC 2.78 (L) 4.22 - 5.81 MIL/uL   Hemoglobin 8.2 (L) 13.0 - 17.0  g/dL   HCT 24.1 (L) 39.0 - 52.0 %   MCV 86.7 80.0 - 100.0 fL   MCH 29.5 26.0 - 34.0 pg   MCHC 34.0 30.0 - 36.0 g/dL   RDW 13.8 11.5 - 15.5 %   Platelets 463 (H) 150 - 400 K/uL   nRBC 0.0 0.0 - 0.2 %   Neutrophils Relative % 87 %   Neutro Abs 9.0 (H) 1.7 - 7.7 K/uL   Lymphocytes Relative 5 %   Lymphs Abs 0.5 (L) 0.7 - 4.0 K/uL   Monocytes Relative 7 %   Monocytes Absolute 0.7 0.1 - 1.0 K/uL   Eosinophils Relative 0 %   Eosinophils Absolute 0.0 0.0 - 0.5 K/uL   Basophils Relative 0 %   Basophils Absolute 0.0 0.0 - 0.1 K/uL   Immature Granulocytes  1 %   Abs Immature Granulocytes 0.09 (H) 0.00 - 0.07 K/uL    Comment: Performed at [facility], [place] [address], [place], [place] [address]  CMP ([place] only)     Status: Abnormal   Collection Time: 04/22/20 10:04 AM  Result Value Ref Range   Sodium 140 135 - 145 mmol/L   Potassium 2.8 (L) 3.5 - 5.1 mmol/L   Chloride 103 98 - 111 mmol/L   CO2 27 22 - 32 mmol/L   Glucose, Bld 109 (H) 70 - 99 mg/dL    Comment: Glucose reference range applies only to samples taken after fasting for at least 8 hours.   BUN 11 6 - 20 mg/dL   Creatinine 0.68 0.61 - 1.24 mg/dL   Calcium 9.7 8.9 - 10.3 mg/dL   Total Protein 7.2 6.5 - 8.1 g/dL   Albumin 2.4 (L) 3.5 - 5.0 g/dL   AST 16 15 - 41 U/L   ALT 15 0 - 44 U/L   Alkaline Phosphatase 111 38 - 126 U/L   Total Bilirubin 0.6 0.3 - 1.2 mg/dL   GFR, Estimated >60 >60 mL/min    Comment: (NOTE) Calculated using the CKD-EPI Creatinine Equation (2021)    Anion gap 10 5 - 15    Comment: Performed at [facility], [place] [address], [place], [place] [address]  CBC with Differential ([place] Only)     Status: Abnormal   Collection Time: 04/22/20 10:04 AM  Result Value Ref Range   WBC Count 11.0 (H) 4.0 - 10.5 K/uL   RBC 3.19 (L) 4.22 - 5.81 MIL/uL   Hemoglobin 9.0 (L) 13.0 - 17.0 g/dL   HCT 28.1 (L) 39.0 - 52.0 %   MCV 88.1 80.0 - 100.0 fL   MCH 28.2 26.0 - 34.0 pg    MCHC 32.0 30.0 - 36.0 g/dL   RDW 15.1 11.5 - 15.5 %   Platelet Count 439 (H) 150 - 400 K/uL   nRBC 0.0 0.0 - 0.2 %   Neutrophils Relative % 79 %   Neutro Abs 8.7 (H) 1.7 - 7.7 K/uL   Lymphocytes Relative 10 %   Lymphs Abs 1.1 0.7 - 4.0 K/uL   Monocytes Relative 8 %   Monocytes Absolute 0.9 0.1 - 1.0 K/uL   Eosinophils Relative 2 %   Eosinophils Absolute 0.2 0.0 - 0.5 K/uL   Basophils Relative 0 %   Basophils Absolute 0.0 0.0 - 0.1 K/uL   Immature Granulocytes 1 %   Abs Immature Granulocytes 0.05 0.00 - 0.07 K/uL    Comment: Performed at [facility], [place] [address], [place], [place] [address]  Urine culture     Status: None   Collection Time: 05/08/20 11:04 AM   Specimen: In/Out Cath Urine  Result Value Ref Range   Specimen Description IN/OUT CATH URINE    Special Requests NONE    Culture      NO GROWTH Performed at [facility], [place] [address] [place], [place] [address]    Report Status 05/10/2020 FINAL   Comprehensive metabolic panel     Status: Abnormal   Collection Time: 05/08/20 11:35 AM  Result Value Ref Range   Sodium 136 135 - 145 mmol/L   Potassium 3.0 (L) 3.5 - 5.1 mmol/L   Chloride 98 98 - 111 mmol/L   CO2 23 22 - 32 mmol/L   Glucose, Bld 111 (H) 70 - 99 mg/dL  Comment: Glucose reference range applies only to samples taken after fasting for at least 8 hours.   BUN 13 6 - 20 mg/dL   Creatinine, Ser 0.72 0.61 - 1.24 mg/dL   Calcium 9.4 8.9 - 10.3 mg/dL   Total Protein 6.8 6.5 - 8.1 g/dL   Albumin 2.3 (L) 3.5 - 5.0 g/dL   AST 17 15 - 41 U/L   ALT 15 0 - 44 U/L   Alkaline Phosphatase 95 38 - 126 U/L   Total Bilirubin 0.3 0.3 - 1.2 mg/dL   GFR, Estimated >60 >60 mL/min    Comment: (NOTE) Calculated using the CKD-EPI Creatinine Equation (2021)    Anion gap 15 5 - 15    Comment: Performed at [facility] [address] [place], [place] [address]  Troponin I (High Sensitivity)     Status: None   Collection Time:  05/08/20 11:35 AM  Result Value Ref Range   Troponin I (High Sensitivity) 12 <18 ng/L    Comment: (NOTE) Elevated high sensitivity troponin I (hsTnI) values and significant  changes across serial measurements may suggest ACS but many other  chronic and acute conditions are known to elevate hsTnI results.  Refer to the "Links" section for chest pain algorithms and additional  guidance. Performed at [facility], [place] [address], [place], [place] [address]   CBC     Status: Abnormal   Collection Time: 05/08/20 11:37 AM  Result Value Ref Range   WBC 19.4 (H) 4.0 - 10.5 K/uL   RBC 3.08 (L) 4.22 - 5.81 MIL/uL   Hemoglobin 8.0 (L) 13.0 - 17.0 g/dL   HCT 27.3 (L) 39.0 - 52.0 %   MCV 88.6 80.0 - 100.0 fL   MCH 26.0 26.0 - 34.0 pg   MCHC 29.3 (L) 30.0 - 36.0 g/dL   RDW 16.2 (H) 11.5 - 15.5 %   Platelets 703 (H) 150 - 400 K/uL   nRBC 0.0 0.0 - 0.2 %    Comment: Performed at [facility] [address] [place], [place] [address]  Blood Culture (routine x 2)     Status: None   Collection Time: 05/08/20 12:48 PM   Specimen: BLOOD  Result Value Ref Range   Specimen Description BLOOD RIGHT ANTECUBITAL    Special Requests      BOTTLES DRAWN AEROBIC AND ANAEROBIC Blood Culture results may not be optimal due to an excessive volume of blood received in culture bottles   Culture      NO GROWTH 5 DAYS Performed at [facility] [address] [place], [place] [address]    Report Status 05/13/2020 FINAL   Lactic acid, plasma     Status: None   Collection Time: 05/08/20 12:49 PM  Result Value Ref Range   Lactic Acid, Venous 1.1 0.5 - 1.9 mmol/L    Comment: Performed at [facility], [place] [address], [place], [place] [address]  Protime-INR     Status: None   Collection Time: 05/08/20 12:49 PM  Result Value Ref Range   Prothrombin Time 14.9 11.4 - 15.2 seconds   INR 1.2 0.8 - 1.2    Comment: (NOTE) INR goal varies based on device and disease states. Performed at  [facility], [place] [address], [place], [place] [address]   APTT     Status: None   Collection Time: 05/08/20 12:49 PM  Result Value Ref Range   aPTT 36 24 -  36 seconds    Comment: Performed at [facility], [place] [address], [place], [place] [address]  Troponin I (High Sensitivity)     Status: None   Collection Time: 05/08/20 12:49 PM  Result Value Ref Range   Troponin I (High Sensitivity) 4 <18 ng/L    Comment: (NOTE) Elevated high sensitivity troponin I (hsTnI) values and significant  changes across serial measurements may suggest ACS but many other  chronic and acute conditions are known to elevate hsTnI results.  Refer to the "Links" section for chest pain algorithms and additional  guidance. Performed at [facility], [place] [address] [place],  [address]   Resp panel by RT-PCR (RSV, Flu A&B, Covid) Nasopharyngeal Swab     Status: None   Collection Time: 05/08/20 12:52 PM   Specimen: Nasopharyngeal Swab; Nasopharyngeal(NP) swabs in vial transport medium  Result Value Ref Range   SARS Coronavirus 2 by RT PCR NEGATIVE NEGATIVE    Comment: (NOTE) SARS-CoV-2 target nucleic acids are NOT DETECTED.  The SARS-CoV-2 RNA is generally detectable in upper respiratory specimens during the acute phase of infection. The lowest concentration of SARS-CoV-2 viral copies this assay can detect is 138 copies/mL. A negative result does not preclude SARS-Cov-2 infection and should not be used as the sole basis for treatment or other patient management decisions. A negative result may occur with  improper specimen collection/handling, submission of specimen other than nasopharyngeal swab, presence of viral mutation(s) within the areas targeted by this assay, and inadequate number of viral copies(<138 copies/mL). A negative result must be combined with clinical observations, patient history, and epidemiological information. The expected result is Negative.  Fact Sheet for  Patients:  EntrepreneurPulse.com.au  Fact Sheet for Healthcare Providers:  IncredibleEmployment.be  This test is no t yet approved or cleared by the [place] FDA and  has been authorized for detection and/or diagnosis of SARS-CoV-2 by FDA under an Emergency Use Authorization (EUA). This EUA will remain  in effect (meaning this test can be used) for the duration of the COVID-19 declaration under Section 564(b)(1) of the Act, 21 U.S.C.section 360bbb-3(b)(1), unless the authorization is terminated  or revoked sooner.       Influenza A by PCR NEGATIVE NEGATIVE   Influenza B by PCR NEGATIVE NEGATIVE    Comment: (NOTE) The Xpert Xpress SARS-CoV-2/FLU/RSV plus assay is intended as an aid in the diagnosis of influenza from Nasopharyngeal swab specimens and should not be used as a sole basis for treatment. Nasal washings and aspirates are unacceptable for Xpert Xpress SARS-CoV-2/FLU/RSV testing.  Fact Sheet for Patients: EntrepreneurPulse.com.au  Fact Sheet for Healthcare Providers: IncredibleEmployment.be  This test is not yet approved or cleared by the [place] FDA and has been authorized for detection and/or diagnosis of SARS-CoV-2 by FDA under an Emergency Use Authorization (EUA). This EUA will remain in effect (meaning this test can be used) for the duration of the COVID-19 declaration under Section 564(b)(1) of the Act, 21 U.S.C. section 360bbb-3(b)(1), unless the authorization is terminated or revoked.     Resp Syncytial Virus by PCR NEGATIVE NEGATIVE    Comment: (NOTE) Fact Sheet for Patients: EntrepreneurPulse.com.au  Fact Sheet for Healthcare Providers: IncredibleEmployment.be  This test is not yet approved or cleared by the [place] FDA and has been authorized for detection and/or diagnosis of SARS-CoV-2 by FDA under an Emergency Use Authorization  (EUA). This EUA will remain in effect (meaning this test can be used) for the duration of the  COVID-19 declaration under Section 564(b)(1) of the Act, 21 U.S.C. section 360bbb-3(b)(1), unless the authorization is terminated or revoked.  Performed at [facility], [place] [address] [place], [place] [address]   Blood Culture (routine x 2)     Status: None   Collection Time: 05/08/20  1:04 PM   Specimen: BLOOD  Result Value Ref Range   Specimen Description BLOOD SITE NOT SPECIFIED    Special Requests      BOTTLES DRAWN AEROBIC AND ANAEROBIC Blood Culture adequate volume   Culture      NO GROWTH 5 DAYS Performed at [facility], [place] [address] [place], [place] [address]    Report Status 05/13/2020 FINAL   Urinalysis, Routine w reflex microscopic Urine, Clean Catch     Status: Abnormal   Collection Time: 05/08/20  5:08 PM  Result Value Ref Range   Color, Urine STRAW (A) YELLOW   APPearance CLEAR CLEAR   Specific Gravity, Urine 1.014 1.005 - 1.030   pH 6.0 5.0 - 8.0   Glucose, UA NEGATIVE NEGATIVE mg/dL   Hgb urine dipstick NEGATIVE NEGATIVE   Bilirubin Urine NEGATIVE NEGATIVE   Ketones, ur NEGATIVE NEGATIVE mg/dL   Protein, ur NEGATIVE NEGATIVE mg/dL   Nitrite NEGATIVE NEGATIVE   Leukocytes,Ua NEGATIVE NEGATIVE    Comment: Performed at [facility] [address], [place], [place] [address]  MRSA PCR Screening     Status: None   Collection Time: 05/08/20  8:17 PM   Specimen: Nasal Mucosa; Nasopharyngeal  Result Value Ref Range   MRSA by PCR NEGATIVE NEGATIVE    Comment:        The GeneXpert MRSA Assay (FDA approved for NASAL specimens only), is one component of a comprehensive MRSA colonization surveillance program. It is not intended to diagnose MRSA infection nor to guide or monitor treatment for MRSA infections. Performed at [facility], [place] [address], [place], [place] [address]   Lactic acid, plasma     Status: None   Collection Time:  05/08/20  8:33 PM  Result Value Ref Range   Lactic Acid, Venous 1.0 0.5 - 1.9 mmol/L    Comment: Performed at [facility] [address], [place], [place] [address]  Procalcitonin - Baseline     Status: None   Collection Time: 05/08/20  8:33 PM  Result Value Ref Range   Procalcitonin 0.23 ng/mL    Comment:        Interpretation: PCT (Procalcitonin) <= 0.5 ng/mL: Systemic infection (sepsis) is not likely. Local bacterial infection is possible. (NOTE)       Sepsis PCT Algorithm           Lower Respiratory Tract                                      Infection PCT Algorithm    ----------------------------     ----------------------------         PCT < 0.25 ng/mL                PCT < 0.10 ng/mL          Strongly encourage             Strongly discourage   discontinuation of antibiotics    initiation of antibiotics    ----------------------------     -----------------------------       PCT  0.25 - 0.50 ng/mL            PCT 0.10 - 0.25 ng/mL               OR       >80% decrease in PCT            Discourage initiation of                                            antibiotics      Encourage discontinuation           of antibiotics    ----------------------------     -----------------------------         PCT >= 0.50 ng/mL              PCT 0.26 - 0.50 ng/mL               AND        <80% decrease in PCT             Encourage initiation of                                             antibiotics       Encourage continuation           of antibiotics    ----------------------------     -----------------------------        PCT >= 0.50 ng/mL                  PCT > 0.50 ng/mL               AND         increase in PCT                  Strongly encourage                                      initiation of antibiotics    Strongly encourage escalation           of antibiotics                                     -----------------------------                                           PCT <=  0.25 ng/mL                                                 OR                                        > 80% decrease in PCT  Discontinue / Do not initiate                                             antibiotics  Performed at [facility], [place] [address] [place], [place] [address]   CBC     Status: Abnormal   Collection Time: 05/09/20  1:25 AM  Result Value Ref Range   WBC 15.2 (H) 4.0 - 10.5 K/uL   RBC 2.67 (L) 4.22 - 5.81 MIL/uL   Hemoglobin 7.0 (L) 13.0 - 17.0 g/dL   HCT 24.0 (L) 39.0 - 52.0 %   MCV 89.9 80.0 - 100.0 fL   MCH 26.2 26.0 - 34.0 pg   MCHC 29.2 (L) 30.0 - 36.0 g/dL   RDW 16.2 (H) 11.5 - 15.5 %   Platelets 522 (H) 150 - 400 K/uL   nRBC 0.0 0.0 - 0.2 %    Comment: Performed at [facility] [address] [place], [place] [address]  Basic metabolic panel     Status: Abnormal   Collection Time: 05/09/20  1:25 AM  Result Value Ref Range   Sodium 137 135 - 145 mmol/L   Potassium 3.4 (L) 3.5 - 5.1 mmol/L   Chloride 102 98 - 111 mmol/L   CO2 24 22 - 32 mmol/L   Glucose, Bld 114 (H) 70 - 99 mg/dL    Comment: Glucose reference range applies only to samples taken after fasting for at least 8 hours.   BUN 14 6 - 20 mg/dL   Creatinine, Ser 0.57 (L) 0.61 - 1.24 mg/dL   Calcium 8.5 (L) 8.9 - 10.3 mg/dL   GFR, Estimated >60 >60 mL/min    Comment: (NOTE) Calculated using the CKD-EPI Creatinine Equation (2021)    Anion gap 11 5 - 15    Comment: Performed at [facility] [address], [place],  [address]  Procalcitonin     Status: None   Collection Time: 05/09/20  1:25 AM  Result Value Ref Range   Procalcitonin 0.28 ng/mL    Comment:        Interpretation: PCT (Procalcitonin) <= 0.5 ng/mL: Systemic infection (sepsis) is not likely. Local bacterial infection is possible. (NOTE)       Sepsis PCT Algorithm           Lower Respiratory Tract                                      Infection PCT Algorithm     ----------------------------     ----------------------------         PCT < 0.25 ng/mL                PCT < 0.10 ng/mL          Strongly encourage             Strongly discourage   discontinuation of antibiotics    initiation of antibiotics    ----------------------------     -----------------------------       PCT 0.25 - 0.50 ng/mL            PCT 0.10 - 0.25 ng/mL               OR       >80% decrease  in PCT            Discourage initiation of                                            antibiotics      Encourage discontinuation           of antibiotics    ----------------------------     -----------------------------         PCT >= 0.50 ng/mL              PCT 0.26 - 0.50 ng/mL               AND        <80% decrease in PCT             Encourage initiation of                                             antibiotics       Encourage continuation           of antibiotics    ----------------------------     -----------------------------        PCT >= 0.50 ng/mL                  PCT > 0.50 ng/mL               AND         increase in PCT                  Strongly encourage                                      initiation of antibiotics    Strongly encourage escalation           of antibiotics                                     -----------------------------                                           PCT <= 0.25 ng/mL                                                 OR                                        > 80% decrease in PCT                                      Discontinue / Do not initiate  antibiotics  Performed at [facility], [place] [address] [place], [place] [address]   Procalcitonin     Status: None   Collection Time: 05/10/20  1:24 AM  Result Value Ref Range   Procalcitonin 0.12 ng/mL    Comment:        Interpretation: PCT (Procalcitonin) <= 0.5 ng/mL: Systemic infection (sepsis) is not likely. Local bacterial infection is  possible. (NOTE)       Sepsis PCT Algorithm           Lower Respiratory Tract                                      Infection PCT Algorithm    ----------------------------     ----------------------------         PCT < 0.25 ng/mL                PCT < 0.10 ng/mL          Strongly encourage             Strongly discourage   discontinuation of antibiotics    initiation of antibiotics    ----------------------------     -----------------------------       PCT 0.25 - 0.50 ng/mL            PCT 0.10 - 0.25 ng/mL               OR       >80% decrease in PCT            Discourage initiation of                                            antibiotics      Encourage discontinuation           of antibiotics    ----------------------------     -----------------------------         PCT >= 0.50 ng/mL              PCT 0.26 - 0.50 ng/mL               AND        <80% decrease in PCT             Encourage initiation of                                             antibiotics       Encourage continuation           of antibiotics    ----------------------------     -----------------------------        PCT >= 0.50 ng/mL                  PCT > 0.50 ng/mL               AND         increase in PCT                  Strongly encourage  initiation of antibiotics    Strongly encourage escalation           of antibiotics                                     -----------------------------                                           PCT <= 0.25 ng/mL                                                 OR                                        > 80% decrease in PCT                                      Discontinue / Do not initiate                                             antibiotics  Performed at [facility], [address] [place], [place] [address]   CBC with Differential/Platelet     Status: Abnormal   Collection Time: 05/10/20  1:24 AM  Result Value Ref Range   WBC  15.4 (H) 4.0 - 10.5 K/uL   RBC 2.54 (L) 4.22 - 5.81 MIL/uL   Hemoglobin 6.8 (LL) 13.0 - 17.0 g/dL    Comment: REPEATED TO VERIFY THIS CRITICAL RESULT HAS VERIFIED AND BEEN CALLED TO S.GURENG,RN BY MELISSA BROGDON ON 01 16 2022 AT 0253, AND HAS BEEN READ BACK.     HCT 21.7 (L) 39.0 - 52.0 %   MCV 85.4 80.0 - 100.0 fL   MCH 26.8 26.0 - 34.0 pg   MCHC 31.3 30.0 - 36.0 g/dL   RDW 16.1 (H) 11.5 - 15.5 %   Platelets 479 (H) 150 - 400 K/uL   nRBC 0.0 0.0 - 0.2 %   Neutrophils Relative % 86 %   Neutro Abs 13.3 (H) 1.7 - 7.7 K/uL   Lymphocytes Relative 6 %   Lymphs Abs 0.9 0.7 - 4.0 K/uL   Monocytes Relative 7 %   Monocytes Absolute 1.1 (H) 0.1 - 1.0 K/uL   Eosinophils Relative 0 %   Eosinophils Absolute 0.0 0.0 - 0.5 K/uL   Basophils Relative 0 %   Basophils Absolute 0.0 0.0 - 0.1 K/uL   Immature Granulocytes 1 %   Abs Immature Granulocytes 0.13 (H) 0.00 - 0.07 K/uL    Comment: Performed at [facility] [address], [place], [place] [address]  Comprehensive metabolic panel     Status: Abnormal   Collection Time: 05/10/20  1:24 AM  Result Value Ref Range   Sodium 138 135 - 145 mmol/L   Potassium 3.1 (L) 3.5 - 5.1 mmol/L   Chloride 103 98 -  111 mmol/L   CO2 27 22 - 32 mmol/L   Glucose, Bld 136 (H) 70 - 99 mg/dL    Comment: Glucose reference range applies only to samples taken after fasting for at least 8 hours.   BUN 14 6 - 20 mg/dL   Creatinine, Ser 0.64 0.61 - 1.24 mg/dL   Calcium 8.5 (L) 8.9 - 10.3 mg/dL   Total Protein 5.5 (L) 6.5 - 8.1 g/dL   Albumin 1.9 (L) 3.5 - 5.0 g/dL   AST 12 (L) 15 - 41 U/L   ALT 10 0 - 44 U/L   Alkaline Phosphatase 74 38 - 126 U/L   Total Bilirubin 0.5 0.3 - 1.2 mg/dL   GFR, Estimated >60 >60 mL/min    Comment: (NOTE) Calculated using the CKD-EPI Creatinine Equation (2021)    Anion gap 8 5 - 15    Comment: Performed at [facility], [place] [address], [place], [place] [address]  Magnesium     Status: Abnormal   Collection Time:  05/10/20  1:24 AM  Result Value Ref Range   Magnesium 1.5 (L) 1.7 - 2.4 mg/dL    Comment: Performed at [facility] [address], [place], [place] [address]  Phosphorus     Status: None   Collection Time: 05/10/20  1:24 AM  Result Value Ref Range   Phosphorus 2.7 2.5 - 4.6 mg/dL    Comment: Performed at [facility] [address] [place], [place] [address]  Reticulocytes     Status: Abnormal   Collection Time: 05/10/20  1:24 AM  Result Value Ref Range   Retic Ct Pct 3.2 (H) 0.4 - 3.1 %   RBC. 2.52 (L) 4.22 - 5.81 MIL/uL   Retic Count, Absolute 80.1 19.0 - 186.0 K/uL   Immature Retic Fract 32.3 (H) 2.3 - 15.9 %    Comment: Performed at [facility] [address] [place], [place] [address]  Prepare RBC (crossmatch)     Status: None   Collection Time: 05/10/20  8:37 AM  Result Value Ref Range   Order Confirmation      ORDER PROCESSED BY BLOOD BANK Performed at [facility], [place] [address], [place], [place] [address]   Type and screen [employer]     Status: None   Collection Time: 05/10/20  8:42 AM  Result Value Ref Range   ABO/RH(D) O POS    Antibody Screen NEG    Sample Expiration 05/13/2020,2359    Unit Number [id number]    Blood Component Type RED CELLS,LR    Unit division 00    Status of Unit ISSUED,FINAL    Transfusion Status OK TO TRANSFUSE    Crossmatch Result      Compatible Performed at [facility], [place] [address], [place],  [address]   BPAM RBC     Status: None   Collection Time: 05/10/20  8:42 AM  Result Value Ref Range   ISSUE DATE / TIME [id number]    Blood Product Unit Number [id number]    PRODUCT CODE [id number]    Unit Type and Rh 5100    Blood Product Expiration Date [id number]   Vitamin B12     Status: None   Collection Time: 05/10/20 10:15 AM  Result Value Ref Range   Vitamin B-12 440 180 - 914 pg/mL    Comment: (NOTE) This assay is not validated for testing neonatal  or myeloproliferative syndrome specimens  for Vitamin B12 levels. Performed at [facility], [place] [address], [place], [place] [address]   Iron and TIBC     Status: Abnormal   Collection Time: 05/10/20 10:15 AM  Result Value Ref Range   Iron 34 (L) 45 - 182 ug/dL   TIBC 161 (L) 250 - 450 ug/dL   Saturation Ratios 21 17.9 - 39.5 %   UIBC 127 ug/dL    Comment: Performed at [facility], [place] [address], [place], [place] [address]  Ferritin     Status: Abnormal   Collection Time: 05/10/20 10:15 AM  Result Value Ref Range   Ferritin 828 (H) 24 - 336 ng/mL    Comment: Performed at [facility], [place] [address] [place], [place] [address]  Folate     Status: None   Collection Time: 05/10/20 10:15 AM  Result Value Ref Range   Folate 16.6 >5.9 ng/mL    Comment: Performed at [facility] [address], [place], [place] [address]  Comprehensive metabolic panel     Status: Abnormal   Collection Time: 05/11/20  1:03 AM  Result Value Ref Range   Sodium 140 135 - 145 mmol/L   Potassium 3.4 (L) 3.5 - 5.1 mmol/L   Chloride 105 98 - 111 mmol/L   CO2 26 22 - 32 mmol/L   Glucose, Bld 83 70 - 99 mg/dL    Comment: Glucose reference range applies only to samples taken after fasting for at least 8 hours.   BUN 11 6 - 20 mg/dL   Creatinine, Ser 0.55 (L) 0.61 - 1.24 mg/dL   Calcium 8.3 (L) 8.9 - 10.3 mg/dL   Total Protein 5.5 (L) 6.5 - 8.1 g/dL   Albumin 1.9 (L) 3.5 - 5.0 g/dL   AST 11 (L) 15 - 41 U/L   ALT 11 0 - 44 U/L   Alkaline Phosphatase 68 38 - 126 U/L   Total Bilirubin 0.7 0.3 - 1.2 mg/dL   GFR, Estimated >60 >60 mL/min    Comment: (NOTE) Calculated using the CKD-EPI Creatinine Equation (2021)    Anion gap 9 5 - 15    Comment: Performed at [facility], [place] [address] [place], [place] [address]  CBC with Differential/Platelet     Status: Abnormal   Collection Time: 05/11/20  1:03 AM  Result Value Ref Range   WBC 10.2 4.0 - 10.5 K/uL   RBC 2.88 (L) 4.22 -  5.81 MIL/uL   Hemoglobin 7.8 (L) 13.0 - 17.0 g/dL   HCT 25.0 (L) 39.0 - 52.0 %   MCV 86.8 80.0 - 100.0 fL   MCH 27.1 26.0 - 34.0 pg   MCHC 31.2 30.0 - 36.0 g/dL   RDW 16.0 (H) 11.5 - 15.5 %   Platelets 461 (H) 150 - 400 K/uL   nRBC 0.0 0.0 - 0.2 %   Neutrophils Relative % 81 %   Neutro Abs 8.2 (H) 1.7 - 7.7 K/uL   Lymphocytes Relative 9 %   Lymphs Abs 0.9 0.7 - 4.0 K/uL   Monocytes Relative 9 %   Monocytes Absolute 1.0 0.1 - 1.0 K/uL   Eosinophils Relative 0 %   Eosinophils Absolute 0.0 0.0 - 0.5 K/uL   Basophils Relative 0 %   Basophils Absolute 0.0 0.0 - 0.1 K/uL   Immature Granulocytes 1 %   Abs Immature Granulocytes 0.09 (H) 0.00 - 0.07 K/uL    Comment: Performed at [facility], [address], [place], [place] [address]  Phosphorus     Status: Abnormal   Collection Time: 05/11/20  1:03 AM  Result Value Ref Range   Phosphorus 1.7 (L) 2.5 - 4.6 mg/dL    Comment: Performed at [facility] [address], [place], [place] [address]  Magnesium     Status: None   Collection Time: 05/11/20  1:03 AM  Result Value Ref Range   Magnesium 1.8 1.7 - 2.4 mg/dL    Comment: Performed at [facility] [address], [place], [place] [address]  Expectorated sputum assessment w rflx to resp cult     Status: None   Collection Time: 05/11/20 11:41 AM   Specimen: Expectorated Sputum  Result Value Ref Range   Specimen Description EXPECTORATED SPUTUM    Special Requests NONE    Sputum evaluation      THIS SPECIMEN IS ACCEPTABLE FOR SPUTUM CULTURE Performed at [facility], [place] [address] [place], [place] [address]    Report Status 05/12/2020 FINAL   Fungus Culture With Stain     Status: None   Collection Time: 05/11/20 11:41 AM  Result Value Ref Range   Fungus Stain Final report    Fungus (Mycology) Culture Final report     Comment: (NOTE) Performed At: [facility] [address] [place], [place] [id number] Rush Farmer MD IN:[id number]     Fungal Source SPUTUM     Comment: Performed at [facility], [place] [address] [place], [place] [address]  Acid Fast Smear (AFB)     Status: None   Collection Time: 05/11/20 11:41 AM   Specimen: Sputum  Result Value Ref Range   AFB Specimen Processing Concentration    Acid Fast Smear Negative     Comment: (NOTE) Performed At: [facility] [address] [place], [place] [id number] Rush Farmer MD YY:[id number]    Source (AFB) SPUTUM     Comment: Performed at [facility], [place] [address] [place], [place] [address]  Culture, respiratory     Status: None   Collection Time: 05/11/20 11:41 AM  Result Value Ref Range   Specimen Description EXPECTORATED SPUTUM    Special Requests NONE Reflexed from [id number]    Gram Stain      ABUNDANT WBC PRESENT, PREDOMINANTLY PMN ABUNDANT GRAM POSITIVE COCCI FEW GRAM POSITIVE RODS    Culture      Consistent with normal respiratory flora. No Pseudomonas species isolated Performed at [facility] [address] [place], [place] [address]    Report Status 05/14/2020 FINAL   Fungus Culture Result     Status: None   Collection Time: 05/11/20 11:41 AM  Result Value Ref Range   Result 1 Comment     Comment: (NOTE) KOH/Calcofluor preparation:  no fungus observed. Performed At: [facility] [place], [place] [id number] Rush Farmer MD ZL:[id number]   Fungal organism reflex     Status: None   Collection Time: 05/11/20 11:41 AM  Result Value Ref Range   Fungal result 1 Comment     Comment: (NOTE) No yeast or mold isolated after 4 weeks. Performed At: [facility] [place], [place] [id number] Rush Farmer MD AQ:[id number]   CBC with Differential/Platelet     Status: Abnormal   Collection Time: 05/28/20 12:00 AM  Result Value Ref Range   WBC 13.1 (H) 3.8 - 10.8 Thousand/uL   RBC 3.39 (L) 4.20 -  5.80 Million/uL   Hemoglobin 8.7 (L) 13.2 - 17.1 g/dL   HCT 27.5 (L) 38.5 -  50.0 %   MCV 81.1 80.0 - 100.0 fL   MCH 25.7 (L) 27.0 - 33.0 pg   MCHC 31.6 (L) 32.0 - 36.0 g/dL   RDW 15.9 (H) 11.0 - 15.0 %   Platelets 676 (H) 140 - 400 Thousand/uL   MPV 8.4 7.5 - 12.5 fL   Neutro Abs 11,502 (H) 1,500 - 7,800 cells/uL   Lymphs Abs 655 (L) 850 - 3,900 cells/uL   Absolute Monocytes 852 200 - 950 cells/uL   Eosinophils Absolute 66 15 - 500 cells/uL   Basophils Absolute 26 0 - 200 cells/uL   Neutrophils Relative % 87.8 %   Total Lymphocyte 5.0 %   Monocytes Relative 6.5 %   Eosinophils Relative 0.5 %   Basophils Relative 0.2 %  POCT URINALYSIS DIP (CLINITEK)     Status: Abnormal   Collection Time: 06/24/20  2:51 PM  Result Value Ref Range   Color, UA brown (A) yellow   Clarity, UA cloudy (A) clear   Glucose, UA negative negative mg/dL   Bilirubin, UA negative negative   Ketones, POC UA negative negative mg/dL   Spec Grav, UA 1.015 1.010 - 1.025   Blood, UA large (A) negative   pH, UA 7.0 5.0 - 8.0   POC PROTEIN,UA >=300 (A) negative, trace   Urobilinogen, UA 0.2 0.2 or 1.0 E.U./dL   Nitrite, UA Negative Negative   Leukocytes, UA Small (1+) (A) Negative    No results found.     All questions at time of visit were answered - patient instructed to contact office with any additional concerns or updates. ER/RTC precautions were reviewed with the patient as applicable.   Please note: manual typing as well as voice recognition software may have been used to produce this document - typos may escape review. Please contact Dr. Sheppard Coil for any needed clarifications.   Total encounter time on date of service, 06/24/20, was 40  minutes spent addressing problems/issues as noted above in [place], including time spent in discussion with patient regarding the HPI, ROS, confirming history, reviewing Assessment & Plan, as well as time spent on coordination of care, record review.

## 2020-06-25 LAB — COMPLETE METABOLIC PANEL WITH GFR
AG Ratio: 1.1 (calc) (ref 1.0–2.5)
ALT: 10 U/L (ref 9–46)
AST: 12 U/L (ref 10–35)
Albumin: 3.8 g/dL (ref 3.6–5.1)
Alkaline phosphatase (APISO): 81 U/L (ref 35–144)
BUN/Creatinine Ratio: 22 (calc) (ref 6–22)
BUN: 15 mg/dL (ref 7–25)
CO2: 28 mmol/L (ref 20–32)
Calcium: 9.7 mg/dL (ref 8.6–10.3)
Chloride: 99 mmol/L (ref 98–110)
Creat: 0.67 mg/dL — ABNORMAL LOW (ref 0.70–1.33)
GFR, Est African American: 122 mL/min/{1.73_m2} (ref >=60)
GFR, Est Non African American: 105 mL/min/{1.73_m2} (ref >=60)
Globulin: 3.5 g/dL (calc) (ref 1.9–3.7)
Glucose, Bld: 100 mg/dL — ABNORMAL HIGH (ref 65–99)
Potassium: 4.3 mmol/L (ref 3.5–5.3)
Sodium: 137 mmol/L (ref 135–146)
Total Bilirubin: 0.4 mg/dL (ref 0.2–1.2)
Total Protein: 7.3 g/dL (ref 6.1–8.1)

## 2020-06-25 LAB — CBC WITH DIFFERENTIAL/PLATELET
Absolute Monocytes: 678 cells/uL (ref 200–950)
Basophils Absolute: 36 cells/uL (ref 0–200)
Basophils Relative: 0.3 %
Eosinophils Absolute: 131 cells/uL (ref 15–500)
Eosinophils Relative: 1.1 %
HCT: 33.5 % — ABNORMAL LOW (ref 38.5–50.0)
Hemoglobin: 10.4 g/dL — ABNORMAL LOW (ref 13.2–17.1)
Lymphs Abs: 1142 cells/uL (ref 850–3900)
MCH: 24.9 pg — ABNORMAL LOW (ref 27.0–33.0)
MCHC: 31 g/dL — ABNORMAL LOW (ref 32.0–36.0)
MCV: 80.3 fL (ref 80.0–100.0)
MPV: 8.7 fL (ref 7.5–12.5)
Monocytes Relative: 5.7 %
Neutro Abs: 9913 cells/uL — ABNORMAL HIGH (ref 1500–7800)
Neutrophils Relative %: 83.3 %
Platelets: 423 10*3/uL — ABNORMAL HIGH (ref 140–400)
RBC: 4.17 10*6/uL — ABNORMAL LOW (ref 4.20–5.80)
RDW: 16.4 % — ABNORMAL HIGH (ref 11.0–15.0)
Total Lymphocyte: 9.6 %
WBC: 11.9 10*3/uL — ABNORMAL HIGH (ref 3.8–10.8)

## 2020-06-25 LAB — URINE CULTURE
MICRO NUMBER:: 11599039
Result:: NO GROWTH
SPECIMEN QUALITY:: ADEQUATE

## 2020-06-25 LAB — PSA, TOTAL WITH REFLEX TO PSA, FREE: PSA, Total: 0.4 ng/mL (ref <=4.0)

## 2020-06-25 LAB — URINALYSIS, ROUTINE W REFLEX MICROSCOPIC
Bacteria, UA: NONE SEEN /HPF
Bilirubin Urine: NEGATIVE
Glucose, UA: NEGATIVE
Hyaline Cast: NONE SEEN /LPF
Ketones, ur: NEGATIVE
Nitrite: NEGATIVE
RBC / HPF: >=60 /HPF — AB (ref 0–2)
Specific Gravity, Urine: 1.005 (ref 1.001–1.03)
Squamous Epithelial / HPF: NONE SEEN /HPF (ref <=5)
pH: 7 (ref 5.0–8.0)

## 2020-06-25 LAB — TSH: TSH: 1.04 mIU/L (ref 0.40–4.50)

## 2020-06-26 LAB — ACID FAST CULTURE WITH REFLEXED SENSITIVITIES (MYCOBACTERIA): Acid Fast Culture: NEGATIVE

## 2020-06-27 ENCOUNTER — Ambulatory Visit (HOSPITAL_BASED_OUTPATIENT_CLINIC_OR_DEPARTMENT_OTHER)
Admission: RE | Admit: 2020-06-27 | Discharge: 2020-06-27 | Disposition: A | Discharge status: Home / Self Care | Payer: BC Managed Care – PPO | Source: Ambulatory Visit | Attending: Osteopathic Medicine | Admitting: Osteopathic Medicine

## 2020-06-27 ENCOUNTER — Encounter (HOSPITAL_BASED_OUTPATIENT_CLINIC_OR_DEPARTMENT_OTHER): Payer: Self-pay

## 2020-06-27 ENCOUNTER — Other Ambulatory Visit: Payer: Self-pay

## 2020-06-27 ENCOUNTER — Ambulatory Visit (HOSPITAL_BASED_OUTPATIENT_CLINIC_OR_DEPARTMENT_OTHER): Admission: RE | Admit: 2020-06-27 | Payer: BC Managed Care – PPO | Source: Ambulatory Visit

## 2020-06-27 DIAGNOSIS — N2889 Other specified disorders of kidney and ureter: Secondary | ICD-10-CM | POA: Insufficient documentation

## 2020-06-27 IMAGING — MR MR ABDOMEN WO/W CM
11 of 20 series · 29 of 48 positions shown · IV contrast (gadavist)
Comparison: CT abdomen pelvis, [DATE]

CLINICAL DATA: Characterize renal lesion, hematuria, history of
lung cancer

EXAM:
MRI ABDOMEN WITHOUT AND WITH CONTRAST
TECHNIQUE: Multiplanar multisequence MR imaging of the abdomen was performed
both before and after the administration of intravenous contrast.
CONTRAST:  7.5mL GADAVIST GADOBUTROL 1 MMOL/ML IV SOLN

[Series 4: T2 · coronal · 6.0mm · 1.48mm/px · 3 of 36 slices shown (1 of 3)]
[im 1/36]
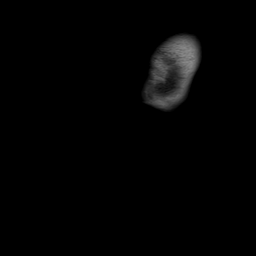
[im 18/36]
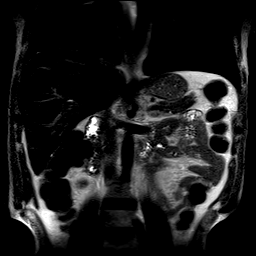
[im 36/36]
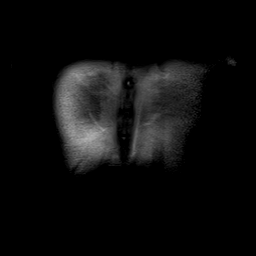

[Series 5: T2 fat-sat · axial · 6.0mm · 1.48mm/px · z∈[-92,+171]mm · 3 of 36 slices shown]
[im 1/36]
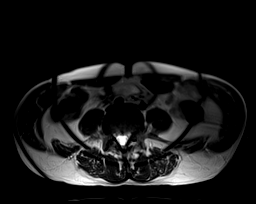
[im 18/36]
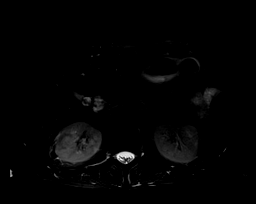
[im 36/36]
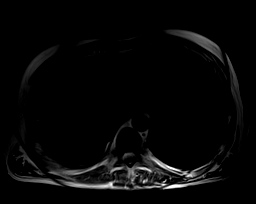

[Series 6: T2 · coronal · 6.0mm · 1.48mm/px · 3 of 36 slices shown (2 of 3)]
[im 1/36]
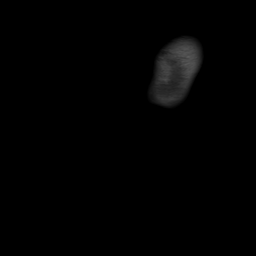
[im 18/36]
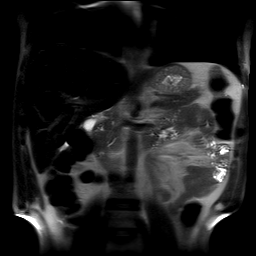
[im 36/36]
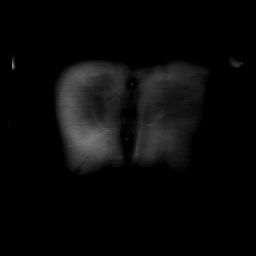

[Series 8: ep2d_diff_b50_500_800 free breathing · axial · 6.0mm · 1.98mm/px · z∈[-84,+178]mm · 6 of 107 slices shown]
[im 1/107]
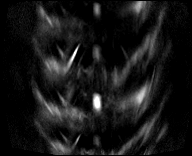
[im 22/107]
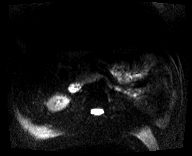
[im 43/107]
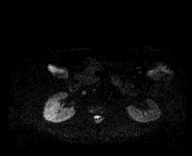
[im 64/107]
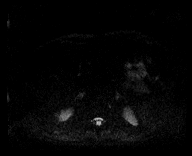
[im 85/107]
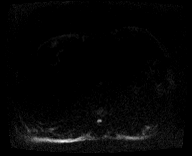
[im 107/107]
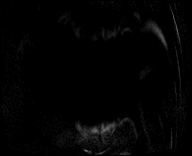

[Series 9: ep2d_diff_b50_500_800 free breathing_adc · axial · 6.0mm · 1.98mm/px · z∈[-84,+178]mm · 2 of 36 slices shown]
[im 1/36]
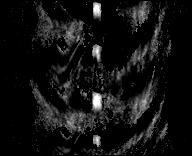
[im 36/36]
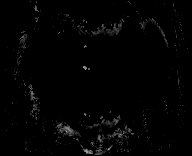

[Series 10: in + out · axial · 6.0mm · 0.74mm/px · z∈[-87,+161]mm · 3 of 68 slices shown]
[im 1/68]
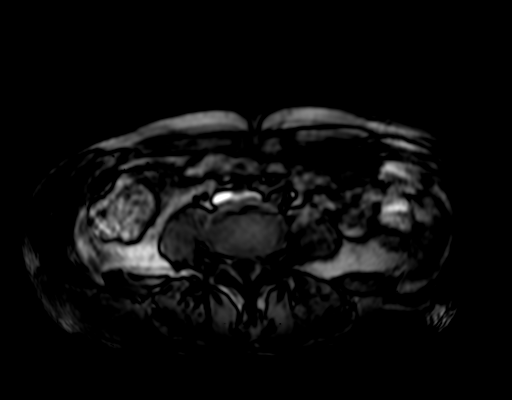
[im 34/68]
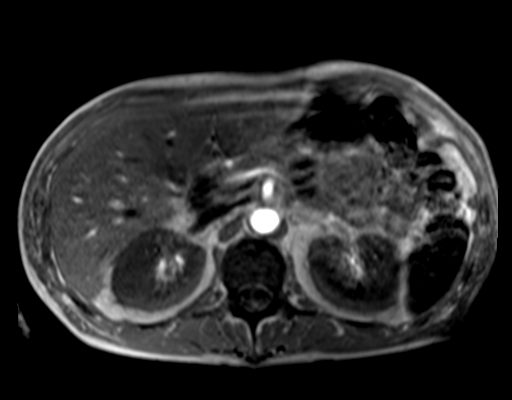
[im 68/68]
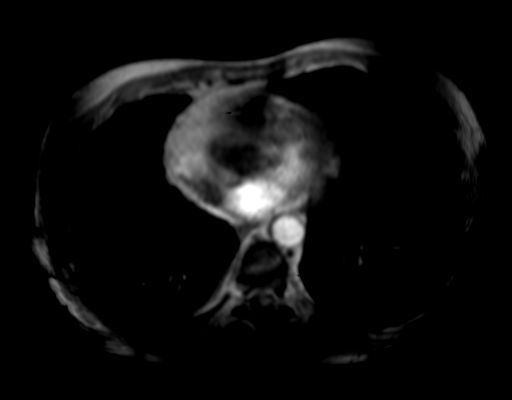

[Series 11: DWI · axial · 6.0mm · 0.74mm/px · z∈[-80,+167]mm · 2 of 34 slices shown]
[im 1/34]
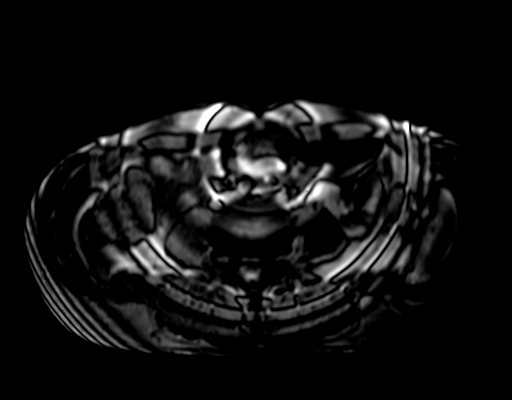
[im 34/34]
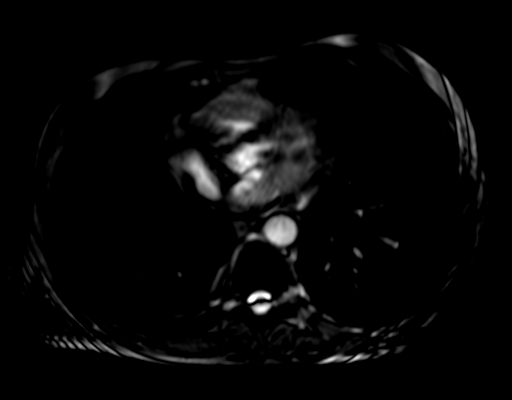

[Series 20: T2 · axial · 6.0mm · 1.48mm/px · z∈[-87,+161]mm · 2 of 34 slices shown (3 of 3)]
[im 1/34]
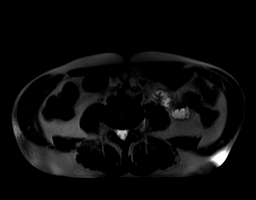
[im 34/34]
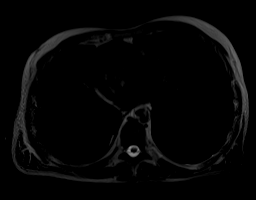

[Series 21: T1 dynamic fat-sat post-contrast · axial · delayed · 5.0mm · 0.74mm/px · z∈[-76,+159]mm · 2 of 48 slices shown]
[im 1/48]
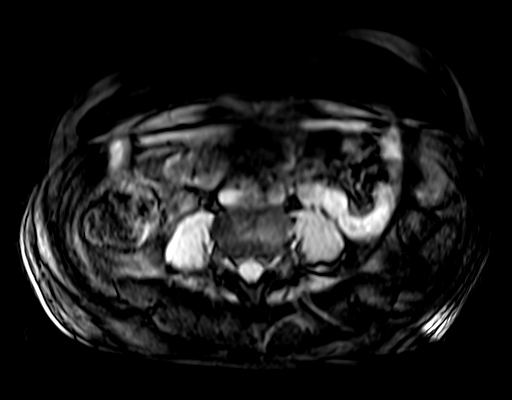
[im 48/48]
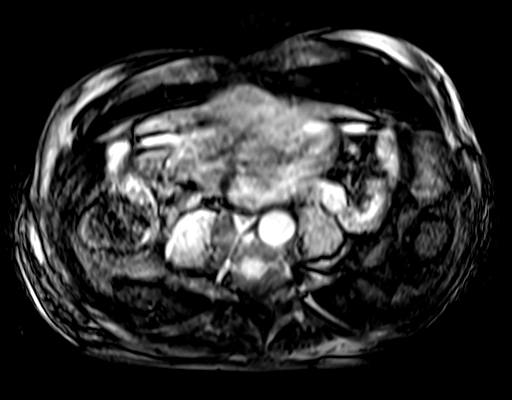

[Series 5002: sub_s21-s12_1 · axial · 5.0mm · 0.74mm/px · z∈[-76,+159]mm · 2 of 48 slices shown]
[im 1/48]
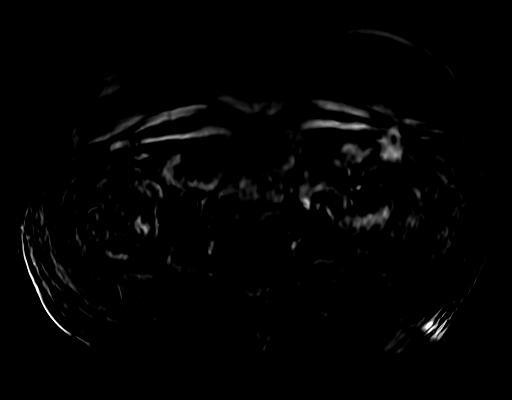
[im 48/48]
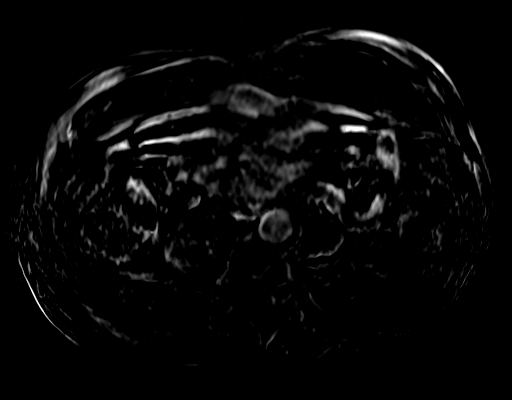

[Series 5003: hx · axial · 10.0mm · 0.82mm/px · 1 of 20 slices shown]
[im 1/20]
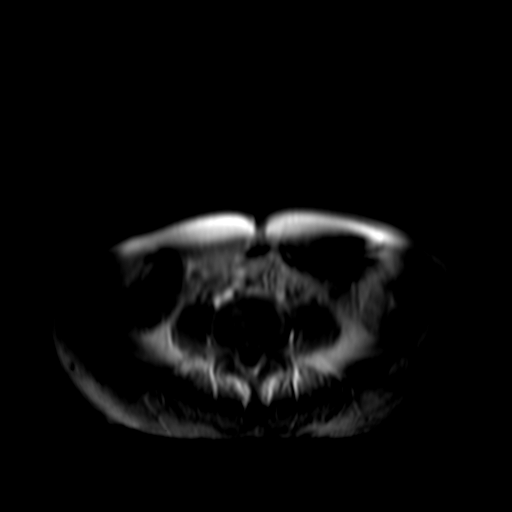

[29 of 48 positions shown; findings below may reference images not displayed]

FINDINGS: Lower chest: No acute findings.

Hepatobiliary: No mass or other parenchymal abnormality identified.

Pancreas: No mass, inflammatory changes, or other parenchymal
abnormality identified.

Spleen:  Within normal limits in size and appearance.

Adrenals/Urinary Tract: There is a redemonstrated, ill-defined
lesion of the lateral midportion of the right kidney, demonstrating
heterogeneous intrinsic T1 and T2 signal with some evidence of fluid
diffusion characteristics. There is ill-defined hypoenhancement of
this lesion and most clearly defined components measure
approximately 3.4 x 2.9 x 2.6 cm (series 15, image 27). No evidence
of hydronephrosis.

Stomach/Bowel: Visualized portions within the abdomen are
unremarkable.

Vascular/Lymphatic: No pathologically enlarged lymph nodes
identified. No abdominal aortic aneurysm demonstrated.

Other:  None.

Musculoskeletal: No suspicious bone lesions identified.
IMPRESSION: There is a redemonstrated, ill-defined, heterogeneous and
hypoenhancing lesion of the lateral midportion of the kidney,
difficult to accurately measure but approximately 3.4 x 2.9 x
cm. Generally favor pyelonephritis and renal phlegmon given this
appearance, which is not typical for renal cell carcinoma. Given
history of lung cancer, metastasis to the renal parenchyma, although
unusual, remains a substantial differential consideration.

These results will be called to the ordering clinician or
representative by the Radiologist Assistant, and communication
documented in the PACS or [REDACTED].

## 2020-06-27 MED ORDER — GADOBUTROL 1 MMOL/ML IV SOLN
7.5000 mL | Freq: Once | INTRAVENOUS | Status: AC | PRN
  Administered 2020-06-27: 7.5 mL via INTRAVENOUS

## 2020-06-28 ENCOUNTER — Other Ambulatory Visit: Payer: Self-pay | Admitting: Pulmonary Disease

## 2020-06-29 NOTE — Telephone Encounter (Signed)
Received a request for doxy 100mg  1 tablet twice daily. Patient was last seen on 06/05/20 and a 14 day supply was sent in for him. Another week supply was sent in on 06/09/20.   His AVS states he will need to stay on this until he spoke with Dr. Earlie Server.   MH, please advise if you wish for him to stay on the doxy 100mg  twice daily. Thanks!

## 2020-07-01 ENCOUNTER — Encounter: Payer: Self-pay | Admitting: Osteopathic Medicine

## 2020-07-02 ENCOUNTER — Encounter: Payer: Self-pay | Admitting: Medical Oncology

## 2020-07-02 NOTE — Telephone Encounter (Signed)
I thought I had put this order in as an urgent referral, a month seems a pretty long time to wait for someone with an abnormal MRI concerning for possible renal cancer and gross hematuria, can we call and see if there is any way we can get him in sooner?  Thanks!

## 2020-07-03 ENCOUNTER — Telehealth: Payer: Self-pay | Admitting: Medical Oncology

## 2020-07-03 NOTE — Telephone Encounter (Signed)
I called [facility] and they are sending a message to the doctor to how soon they can get the patient in. I called Cong to let him know [facility] will be calling him and if he does not hear from them by Monday by 2 to call me back and I will call [facility] again to see if they heard from the doctor yet.

## 2020-07-03 NOTE — Telephone Encounter (Signed)
Wife requested I email her the L[employer] documents. She will take them to Dr Silas Flood to complete. Dr Julien Nordmann will not complete since [place] is under observation with [facility].   I faxed the LTD forms back to [employer] to email to wife so she can take the forms to Dr Silas Flood.

## 2020-07-08 ENCOUNTER — Telehealth: Payer: Self-pay | Admitting: Medical Oncology

## 2020-07-08 NOTE — Telephone Encounter (Signed)
LVM to please fax all disability forms to Dr Julien Nordmann to complete. Fax number and phone number left on VM.

## 2020-07-09 ENCOUNTER — Other Ambulatory Visit (HOSPITAL_COMMUNITY): Payer: Self-pay | Admitting: Urology

## 2020-07-09 DIAGNOSIS — D49511 Neoplasm of unspecified behavior of right kidney: Secondary | ICD-10-CM

## 2020-07-15 ENCOUNTER — Telehealth: Payer: Self-pay | Admitting: Medical Oncology

## 2020-07-15 NOTE — Telephone Encounter (Signed)
Disability Form -Wife stated she no longer needs for Richard Li to complete his disability form.

## 2020-07-17 ENCOUNTER — Ambulatory Visit (HOSPITAL_COMMUNITY): Payer: BC Managed Care – PPO

## 2020-07-17 ENCOUNTER — Encounter (HOSPITAL_COMMUNITY): Payer: Self-pay

## 2020-07-20 ENCOUNTER — Inpatient Hospital Stay: Payer: BC Managed Care – PPO | Attending: Internal Medicine

## 2020-07-20 ENCOUNTER — Ambulatory Visit (HOSPITAL_COMMUNITY)
Admission: RE | Admit: 2020-07-20 | Discharge: 2020-07-20 | Disposition: A | Discharge status: Home / Self Care | Payer: BC Managed Care – PPO | Source: Ambulatory Visit | Attending: Internal Medicine | Admitting: Internal Medicine

## 2020-07-20 ENCOUNTER — Other Ambulatory Visit: Payer: Self-pay

## 2020-07-20 DIAGNOSIS — C3490 Malignant neoplasm of unspecified part of unspecified bronchus or lung: Secondary | ICD-10-CM

## 2020-07-20 DIAGNOSIS — I10 Essential (primary) hypertension: Secondary | ICD-10-CM | POA: Insufficient documentation

## 2020-07-20 DIAGNOSIS — J449 Chronic obstructive pulmonary disease, unspecified: Secondary | ICD-10-CM | POA: Diagnosis not present

## 2020-07-20 DIAGNOSIS — Z7952 Long term (current) use of systemic steroids: Secondary | ICD-10-CM | POA: Diagnosis not present

## 2020-07-20 DIAGNOSIS — R5383 Other fatigue: Secondary | ICD-10-CM | POA: Insufficient documentation

## 2020-07-20 DIAGNOSIS — E785 Hyperlipidemia, unspecified: Secondary | ICD-10-CM | POA: Insufficient documentation

## 2020-07-20 DIAGNOSIS — K219 Gastro-esophageal reflux disease without esophagitis: Secondary | ICD-10-CM | POA: Diagnosis not present

## 2020-07-20 DIAGNOSIS — C3411 Malignant neoplasm of upper lobe, right bronchus or lung: Secondary | ICD-10-CM | POA: Diagnosis present

## 2020-07-20 DIAGNOSIS — Z79899 Other long term (current) drug therapy: Secondary | ICD-10-CM | POA: Insufficient documentation

## 2020-07-20 DIAGNOSIS — C3491 Malignant neoplasm of unspecified part of right bronchus or lung: Secondary | ICD-10-CM

## 2020-07-20 LAB — CBC WITH DIFFERENTIAL (CANCER CENTER ONLY)
Abs Immature Granulocytes: 0.04 10*3/uL (ref 0.00–0.07)
Basophils Absolute: 0 10*3/uL (ref 0.0–0.1)
Basophils Relative: 1 %
Eosinophils Absolute: 0.1 10*3/uL (ref 0.0–0.5)
Eosinophils Relative: 1 %
HCT: 34.8 % — ABNORMAL LOW (ref 39.0–52.0)
Hemoglobin: 10.6 g/dL — ABNORMAL LOW (ref 13.0–17.0)
Immature Granulocytes: 1 %
Lymphocytes Relative: 14 %
Lymphs Abs: 1.1 10*3/uL (ref 0.7–4.0)
MCH: 25.4 pg — ABNORMAL LOW (ref 26.0–34.0)
MCHC: 30.5 g/dL (ref 30.0–36.0)
MCV: 83.3 fL (ref 80.0–100.0)
Monocytes Absolute: 0.6 10*3/uL (ref 0.1–1.0)
Monocytes Relative: 7 %
Neutro Abs: 6.3 10*3/uL (ref 1.7–7.7)
Neutrophils Relative %: 76 %
Platelet Count: 278 10*3/uL (ref 150–400)
RBC: 4.18 MIL/uL — ABNORMAL LOW (ref 4.22–5.81)
RDW: 17.4 % — ABNORMAL HIGH (ref 11.5–15.5)
WBC Count: 8.2 10*3/uL (ref 4.0–10.5)
nRBC: 0 % (ref 0.0–0.2)

## 2020-07-20 LAB — CMP (CANCER CENTER ONLY)
ALT: 10 U/L (ref 0–44)
AST: 13 U/L — ABNORMAL LOW (ref 15–41)
Albumin: 3.6 g/dL (ref 3.5–5.0)
Alkaline Phosphatase: 72 U/L (ref 38–126)
Anion gap: 9 (ref 5–15)
BUN: 12 mg/dL (ref 6–20)
CO2: 27 mmol/L (ref 22–32)
Calcium: 9.3 mg/dL (ref 8.9–10.3)
Chloride: 103 mmol/L (ref 98–111)
Creatinine: 0.84 mg/dL (ref 0.61–1.24)
GFR, Estimated: >60 mL/min (ref >60)
Glucose, Bld: 106 mg/dL — ABNORMAL HIGH (ref 70–99)
Potassium: 4.6 mmol/L (ref 3.5–5.1)
Sodium: 139 mmol/L (ref 135–145)
Total Bilirubin: 0.3 mg/dL (ref 0.3–1.2)
Total Protein: 7.6 g/dL (ref 6.5–8.1)

## 2020-07-20 IMAGING — CT CT CHEST W/ CM
2 of 4 series · 13 of 36 positions shown, 16 images · IV contrast (OMNIPAQUE)
Comparison: Most recent CT chest [DATE].  [DATE] PET-CT.

CLINICAL DATA: Primary Cancer Type: Lung
TECHNIQUE: Multidetector CT imaging of the chest was performed during
intravenous contrast administration.

CONTRAST:  75mL OMNIPAQUE IOHEXOL 300 MG/ML  SOLN

[Series 2: axial st · axial · 0.78mm/px · z∈[-334,-48]mm · 10 of 171 slices shown, 13 images]
[im 14/171  mediastinal]
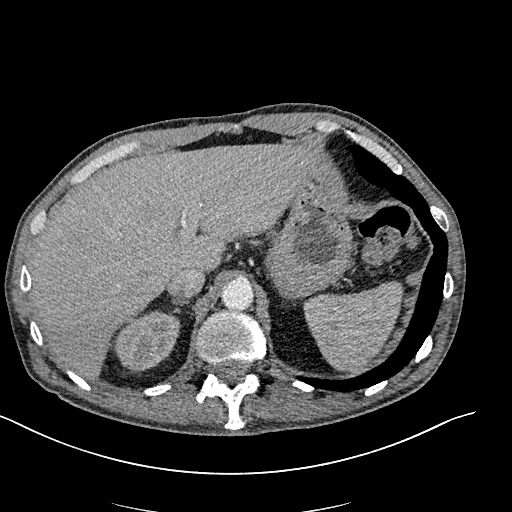
[im 14/171  lung]
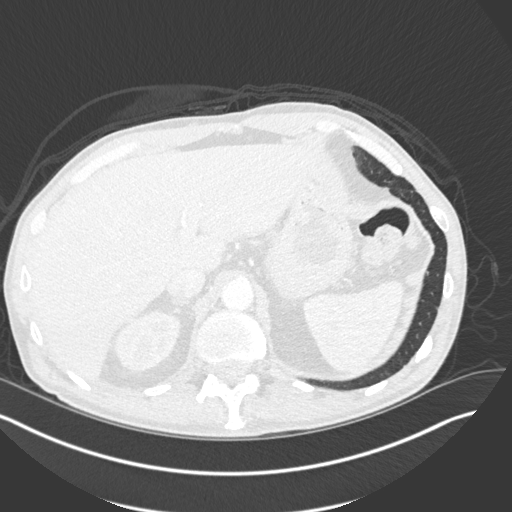
[im 27/171  lung]
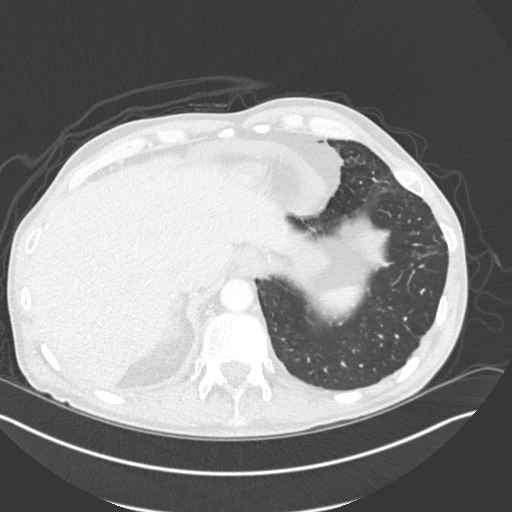
[im 53/171  lung]
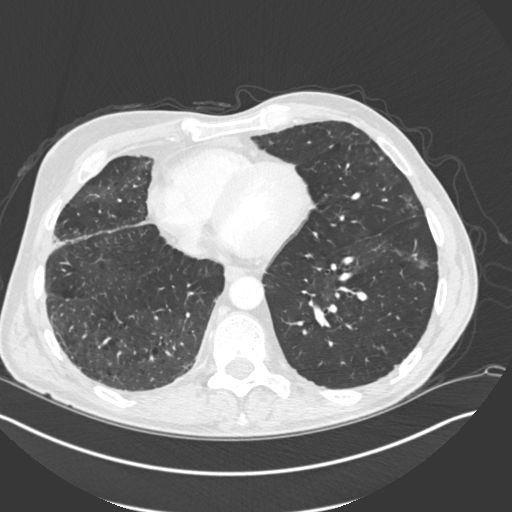
[im 66/171  lung]
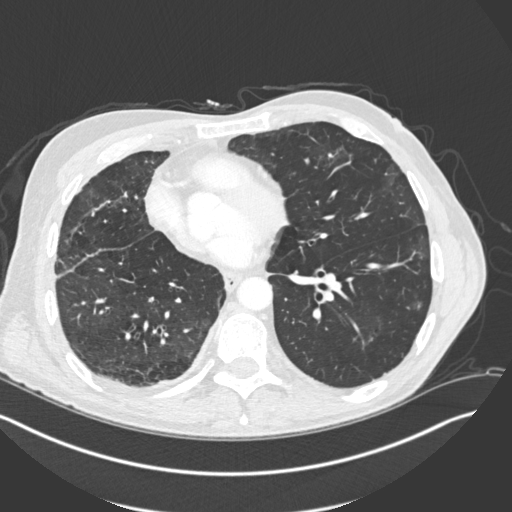
[im 79/171  mediastinal]
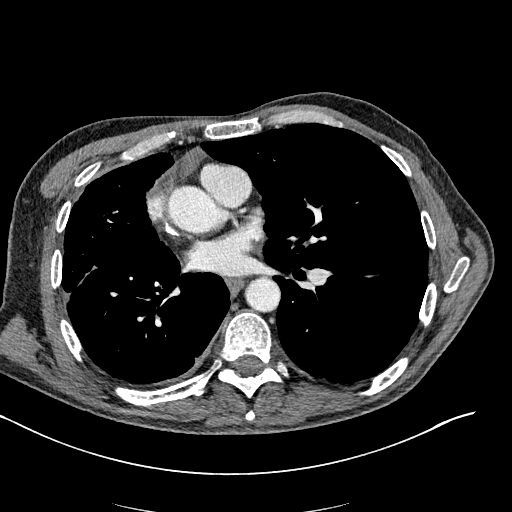
[im 79/171  lung]
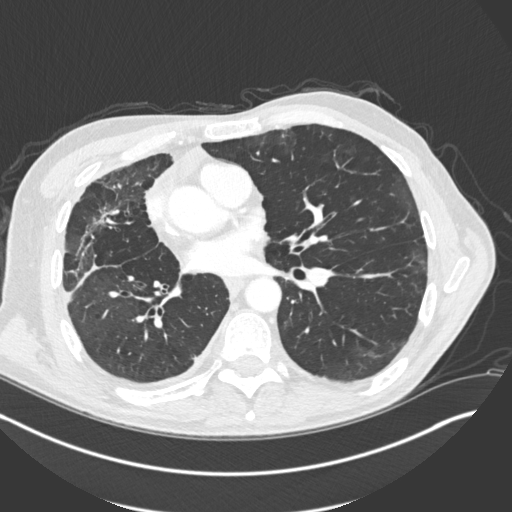
[im 92/171  lung]
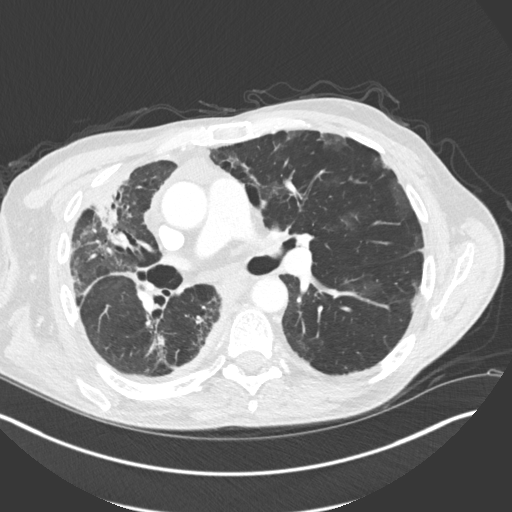
[im 105/171  lung]
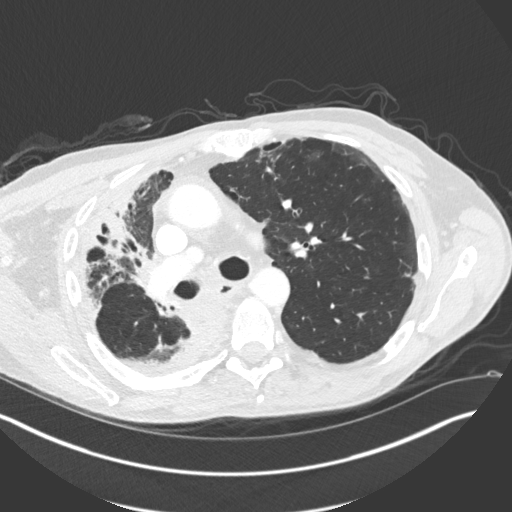
[im 131/171  lung]
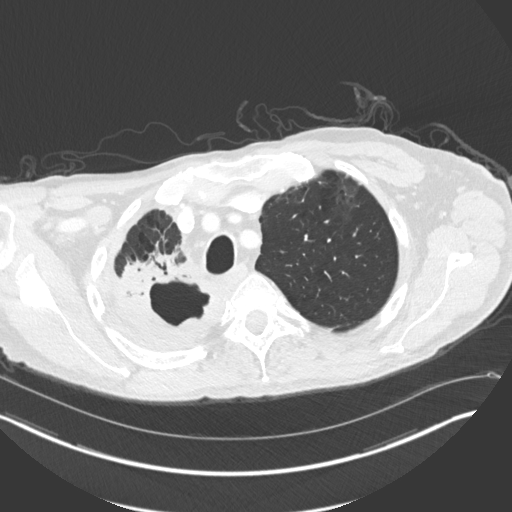
[im 144/171  mediastinal]
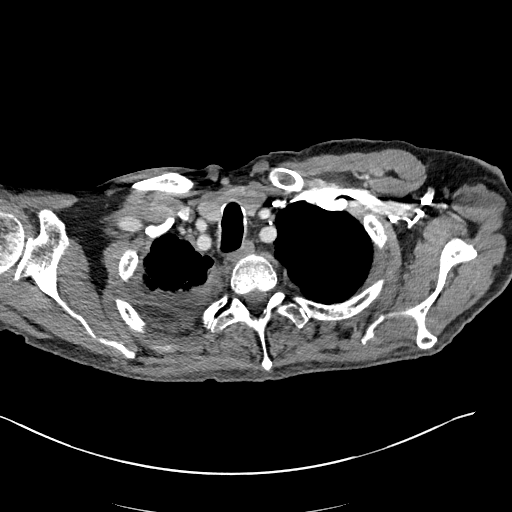
[im 144/171  lung]
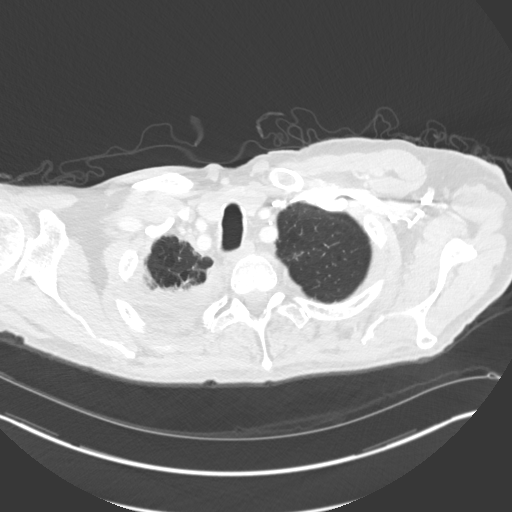
[im 157/171  lung]
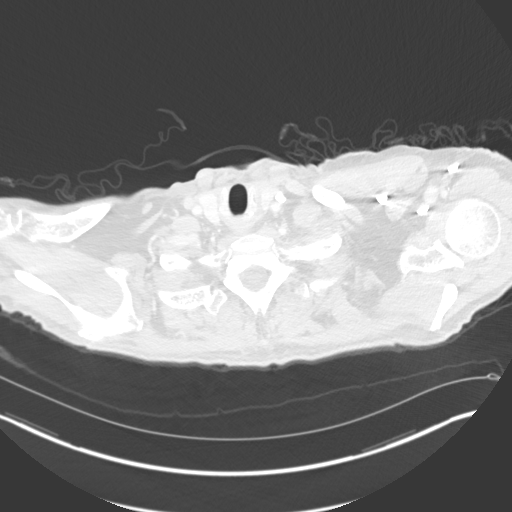

[Series 6: coronal · coronal · 0.71mm/px · 3 of 134 slices shown]
[im 27/134  lung]
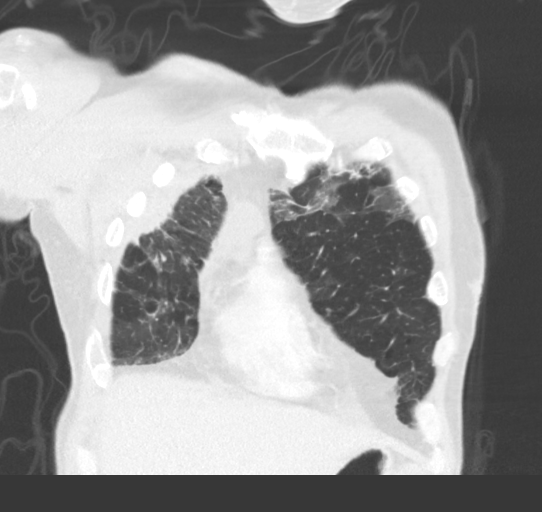
[im 54/134  lung]
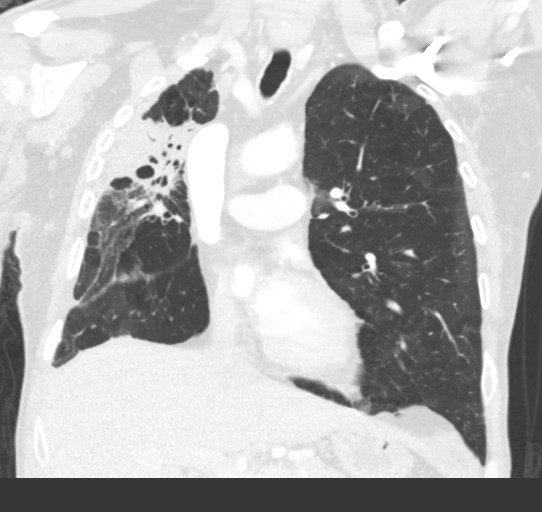
[im 80/134  lung]
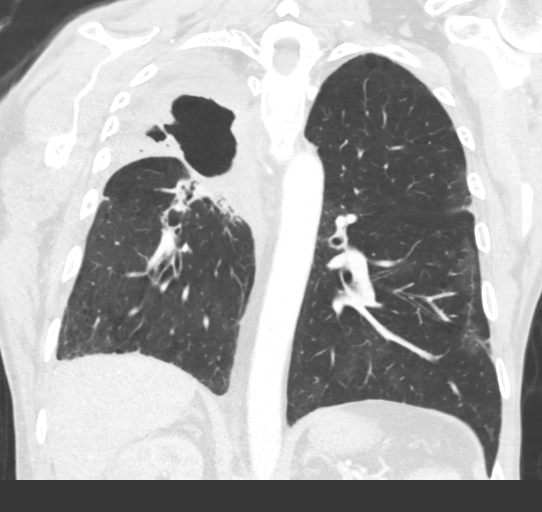

[13 of 36 positions shown; findings below may reference images not displayed]

Imaging Indication: Routine surveillance

Interval therapy since last imaging? No

Initial Cancer Diagnosis

Date: [DATE]; Established by: Biopsy-proven

Detailed Pathology: Stage IIB non-small cell lung cancer,
adenocarcinoma.

Primary Tumor location:  Perihilar right lung mass.

Surgeries: Appendectomy.

Chemotherapy: Yes; Ongoing?  No; Most recent administration: [DATE]

Immunotherapy? No

Radiation therapy? Yes; Date Range: [DATE] - [DATE]; Target:
Chest

EXAM:
CT CHEST WITH CONTRAST
FINDINGS: Cardiovascular: Small pericardial effusion slightly diminished
subjectively compared to recent comparison imaging. Aorta normal
caliber with scattered atheromatous plaque. Central pulmonary
vasculature unremarkable on venous phase aside from soft tissue
encasing RIGHT hilar structures that shows a similar appearance to
previous imaging

Mediastinum/Nodes: RIGHT paramediastinal soft tissue blending with
fibrosis in the RIGHT upper lobe. Subcarinal soft tissue and mildly
enlarged lymph nodes in the subcarinal region measuring 16 mm
greatest thickness, stable compared to previous imaging. No axillary
lymphadenopathy. No thoracic inlet lymphadenopathy

Lungs/Pleura: Cavitary area in the RIGHT upper lobe amidst post
treatment changes measuring 5.2 x 4.0 cm, in communication with
bronchial structures previously approximately 5.0 x 2.6 cm.
Thick-walled changes seen posterior to this area are similar.
Confluent soft tissue about the RIGHT hilum and along the medial
RIGHT chest also with similar appearance.

There is however pleural nodularity which is evident along the
medial RIGHT chest, for instance 7 mm nodule on image 102 of series
2 following resolution of much of the pleural fluid that was present
in small volume on the prior study in this location. Extrapleural
fat with soft tissue extending along the anterior surface of the
RIGHT posterior fourth rib (image 33, series 2) this has convex
margins.

Subtle pleural nodularity in the LEFT chest not definitive, for
instance on image 136 of series 2 of today's study 2-3 mm areas of
nodularity along the extrapleural fat.

Extensive ill-defined nodularity that was present on the previous
study shows resolution of soft tissue component throughout the chest
now with only ground-glass remaining in the mixed density areas that
were seen previously. There is however a solid appearing small
nodule in the posterior LEFT chest on image 96 of series 5 that
measures 7 mm, potentially present previously and partially obscured
by interstitial and airspace disease that was seen on the prior
study.

Upper Abdomen: Ill-defined area in the upper pole laterally of the
RIGHT kidney seen on previous imaging, incompletely visualized.
Imaged portions the liver, pancreas and spleen are unremarkable. The
adrenal glands are normal. Imaged gastrointestinal tract without
acute process.

Musculoskeletal: No acute bone finding or destructive bone process
IMPRESSION: 1. Extensive ill-defined nodularity that was present on the previous
study shows resolution of soft tissue component throughout the chest
now with only ground-glass remaining in the mixed density areas that
were seen previously. This may represent resolving infection or
inflammation. Attention on follow-up, resolving drug reaction could
also have this appearance.
2. Pleural thickening with convex margin in the RIGHT upper lobe
along the posterior aspect of the treated area and bronchopleural
fistula, findings raise the question of disease in the chest and are
seen in the context of subtle pleural nodularity that is developing
after resolution of much of the pleural fluid that was present on
previous study.
3. Subtle pleural nodularity in the LEFT chest may be indicative of
developing disease as well. Attention on follow-up is suggested.
4. Infiltrative partially imaged RIGHT renal lesion remains
nonspecific, in the absence of known infection infiltrative
metastatic lesion or primary renal neoplasm are favored. Consider
short interval follow-up as warranted and correlation with prior
urine studies to determine whether infection is a viable
differential consideration
5. Small LEFT lower lobe nodule, attention on follow-up.
6. Small pericardial effusion slightly diminished subjectively
compared to recent comparison imaging.
7. Aortic atherosclerosis.

Aortic Atherosclerosis ([PC]-[PC]).

## 2020-07-20 MED ORDER — IOHEXOL 300 MG/ML  SOLN
75.0000 mL | Freq: Once | INTRAMUSCULAR | Status: AC | PRN
  Administered 2020-07-20: 75 mL via INTRAVENOUS

## 2020-07-22 ENCOUNTER — Telehealth: Payer: Self-pay | Admitting: Internal Medicine

## 2020-07-22 ENCOUNTER — Other Ambulatory Visit: Payer: Self-pay

## 2020-07-22 ENCOUNTER — Inpatient Hospital Stay (HOSPITAL_BASED_OUTPATIENT_CLINIC_OR_DEPARTMENT_OTHER): Payer: BC Managed Care – PPO | Admitting: Internal Medicine

## 2020-07-22 DIAGNOSIS — C3411 Malignant neoplasm of upper lobe, right bronchus or lung: Secondary | ICD-10-CM | POA: Diagnosis not present

## 2020-07-22 DIAGNOSIS — C3490 Malignant neoplasm of unspecified part of unspecified bronchus or lung: Secondary | ICD-10-CM | POA: Diagnosis not present

## 2020-07-22 NOTE — Telephone Encounter (Signed)
Scheduled follow-up appointments per 3/30 los. Patient is aware. 

## 2020-07-22 NOTE — Progress Notes (Signed)
[place] [phone]   [phone]  OFFICE PROGRESS NOTE  [name], DO [place] [address]  DIAGNOSIS: Stage IIB (T3, N0, M0) non-small cell lung cancer, adenocarcinoma presented with large central perihilar mass with suspicious groundglass opacity in the right middle lobe and left upper lobe diagnosed in September 2021.  PRIOR THERAPY: Concurrent chemoradiation with weekly carboplatin for AUC of 2 and paclitaxel 45 mg/M2.  First dose February 10, 2020.  Status post 5  cycles.    CURRENT THERAPY: Observation.  INTERVAL HISTORY: Richard Li 60 y.o. male returns to the clinic today for follow-up visit accompanied by his wife.  The patient is feeling fine today with no concerning complaints except for the baseline shortness of breath increased with exertion.  He denied having any current chest pain, cough or hemoptysis.  He has no nausea, vomiting, diarrhea or constipation.  He has no recent weight loss or night sweats.  The patient had repeat CT scan of the chest performed recently and is here for evaluation and discussion of his scan results.   MEDICAL HISTORY: Past Medical History:  Diagnosis Date  . Allergic rhinitis, cause unspecified   . Anxiety state, unspecified   . Asthma    as a child  . Chronic airway obstruction, not elsewhere classified   . Esophageal reflux   . History of kidney stones   . Hypertension   . Lumbago   . lung ca dx'd 12/2019  . Other and unspecified hyperlipidemia   . Other chest pain     ALLERGIES:  is allergic to amoxicillin.  MEDICATIONS:  Current Outpatient Medications  Medication Sig Dispense Refill  . acetaminophen (TYLENOL) 500 MG tablet Take 1,000 mg by mouth 2 (two) times daily.    [name] dextromethorphan-guaiFENesin (MUCINEX DM) 30-600 MG 12hr tablet Take 1 tablet by mouth 2 (two) times daily.    [name] doxycycline (VIBRA-TABS) 100 MG tablet Take 1 tablet (100  mg total) by mouth 2 (two) times daily. 28 tablet 0  . doxycycline (VIBRA-TABS) 100 MG tablet Take 1 tablet by mouth twice daily 60 tablet 0  . feeding supplement (ENSURE ENLIVE / ENSURE PLUS) LIQD Take 237 mLs by mouth 3 (three) times daily between meals. 237 mL 12  . fenofibrate 160 MG tablet Take 1 tablet by mouth once daily 90 tablet 3  . ferrous sulfate 325 (65 FE) MG tablet Take 1 tablet (325 mg total) by mouth daily with breakfast. 30 tablet 0  . folic acid (FOLVITE) 1 MG tablet Take 1 tablet (1 mg total) by mouth daily. 30 tablet 2  . Ipratropium-Albuterol (COMBIVENT RESPIMAT) 20-100 MCG/ACT AERS respimat Inhale 1-2 puffs into the lungs every 6 (six) hours as needed for wheezing. 4 g 3  . LORazepam (ATIVAN) 1 MG tablet TAKE 1 TABLET BY MOUTH THREE TIMES DAILY AS NEEDED FOR ANXIETY (Patient taking differently: Take 1 mg by mouth See admin instructions. Take one tablet (1 mg) by mouth daily at bedtime, may also take one tablet (1 mg) twice during the day as needed for anxiety) 90 tablet 0  . meclizine (ANTIVERT) 25 MG tablet Take 1 tablet (25 mg total) by mouth every 6 (six) hours as needed for dizziness. 60 tablet 0  . Multiple Vitamin (MULTIVITAMIN WITH MINERALS) TABS tablet Take 1 tablet by mouth daily.    . predniSONE (DELTASONE) 10 MG tablet Take 1 tablet (10 mg total) by mouth  daily with breakfast. 30 tablet 0  . prochlorperazine (COMPAZINE) 10 MG tablet TAKE 1 TABLET BY MOUTH EVERY 6 HOURS AS NEEDED FOR NAUSEA FOR VOMITING (Patient taking differently: Take 10 mg by mouth every 6 (six) hours as needed for nausea or vomiting.) 30 tablet 0   No current facility-administered medications for this visit.   Facility-Administered Medications Ordered in Other Visits  Medication Dose Route Frequency Provider Last Rate Last Admin  . 0.9 %  sodium chloride infusion   Intravenous Continuous Curt Bears, MD   Stopped at 03/05/20 1410    SURGICAL HISTORY:  Past Surgical History:  Procedure  Laterality Date  . APPENDECTOMY    . BRONCHIAL BIOPSY  01/07/2020   Procedure: BRONCHIAL BIOPSIES;  Surgeon: Collene Gobble, MD;  Location: [facility] ENDOSCOPY;  Service: Pulmonary;;  . BRONCHIAL BIOPSY  04/05/2020   Procedure: BRONCHIAL BIOPSIES;  Surgeon: Rigoberto Noel, MD;  Location: [place];  Service: Cardiopulmonary;;  . BRONCHIAL BRUSHINGS  01/07/2020   Procedure: BRONCHIAL BRUSHINGS;  Surgeon: Collene Gobble, MD;  Location: [facility] ENDOSCOPY;  Service: Pulmonary;;  . BRONCHIAL NEEDLE ASPIRATION BIOPSY  01/07/2020   Procedure: BRONCHIAL NEEDLE ASPIRATION BIOPSIES;  Surgeon: Collene Gobble, MD;  Location: [facility] ENDOSCOPY;  Service: Pulmonary;;  . BRONCHIAL WASHINGS  01/07/2020   Procedure: BRONCHIAL WASHINGS;  Surgeon: Collene Gobble, MD;  Location: [facility] ENDOSCOPY;  Service: Pulmonary;;  . BRONCHIAL WASHINGS  04/05/2020   Procedure: BRONCHIAL WASHINGS;  Surgeon: Rigoberto Noel, MD;  Location: [place];  Service: Cardiopulmonary;;  . COLONOSCOPY  15 years ago  . HEMOSTASIS CONTROL  01/07/2020   Procedure: HEMOSTASIS CONTROL;  Surgeon: Collene Gobble, MD;  Location: [facility] ENDOSCOPY;  Service: Pulmonary;;  cold saline  . NASAL TURBINATE REDUCTION  2002   Dr.Crossley  . VASECTOMY    . VIDEO BRONCHOSCOPY Right 04/05/2020   Procedure: VIDEO BRONCHOSCOPY WITH FLUORO;  Surgeon: Rigoberto Noel, MD;  Location: [place];  Service: Cardiopulmonary;  Laterality: Right;  [name] VIDEO BRONCHOSCOPY WITH ENDOBRONCHIAL NAVIGATION N/A 01/07/2020   Procedure: VIDEO BRONCHOSCOPY WITH ENDOBRONCHIAL NAVIGATION;  Surgeon: Collene Gobble, MD;  Location: [place] ENDOSCOPY;  Service: Pulmonary;  Laterality: N/A;  . VIDEO BRONCHOSCOPY WITH ENDOBRONCHIAL ULTRASOUND N/A 01/07/2020   Procedure: VIDEO BRONCHOSCOPY WITH ENDOBRONCHIAL ULTRASOUND;  Surgeon: Collene Gobble, MD;  Location: [place] ENDOSCOPY;  Service: Pulmonary;  Laterality: N/A;    REVIEW OF SYSTEMS:  A comprehensive review of systems was negative except for:  Constitutional: positive for fatigue Respiratory: positive for dyspnea on exertion   PHYSICAL EXAMINATION: General appearance: alert, cooperative, fatigued and no distress Head: Normocephalic, without obvious abnormality, atraumatic Neck: no adenopathy, no JVD, supple, symmetrical, trachea midline and thyroid not enlarged, symmetric, no tenderness/mass/nodules Lymph nodes: Cervical, supraclavicular, and axillary nodes normal. Resp: rales RUL and rhonchi RUL Back: symmetric, no curvature. ROM normal. No CVA tenderness. Cardio: regular rate and rhythm, S1, S2 normal, no murmur, click, rub or gallop GI: soft, non-tender; bowel sounds normal; no masses,  no organomegaly Extremities: extremities normal, atraumatic, no cyanosis or edema  ECOG PERFORMANCE STATUS: 1 - Symptomatic but completely ambulatory  Blood pressure 112/71, pulse 95, temperature (!) 94.6 F (34.8 C), temperature source Tympanic, resp. rate 17, weight 175 lb (79.4 kg), SpO2 99 %.  LABORATORY DATA: Lab Results  Component Value Date   WBC 8.2 07/20/2020   HGB 10.6 (L) 07/20/2020   HCT 34.8 (L) 07/20/2020   MCV 83.3 07/20/2020   PLT 278 07/20/2020  Chemistry      Component Value Date/Time   NA 139 07/20/2020 1520   K 4.6 07/20/2020 1520   CL 103 07/20/2020 1520   CO2 27 07/20/2020 1520   BUN 12 07/20/2020 1520   CREATININE 0.84 07/20/2020 1520   CREATININE 0.67 (L) 06/24/2020 1517      Component Value Date/Time   CALCIUM 9.3 07/20/2020 1520   ALKPHOS 72 07/20/2020 1520   AST 13 (L) 07/20/2020 1520   ALT 10 07/20/2020 1520   BILITOT 0.3 07/20/2020 1520       RADIOGRAPHIC STUDIES: CT Chest W Contrast  Result Date: 07/21/2020 CLINICAL DATA:  Primary Cancer Type: Lung Imaging Indication: Routine surveillance Interval therapy since last imaging? No Initial Cancer Diagnosis Date: 01/07/2020; Established by: Biopsy-proven Detailed Pathology: Stage IIB non-small cell lung cancer, adenocarcinoma. Primary Tumor  location:  Perihilar right lung mass. Surgeries: Appendectomy. Chemotherapy: Yes; Ongoing?  No; Most recent administration: 02/2020 Immunotherapy? No Radiation therapy? Yes; Date Range: 02/12/2020 - 03/24/2020; Target: Chest EXAM: CT CHEST WITH CONTRAST TECHNIQUE: Multidetector CT imaging of the chest was performed during intravenous contrast administration. CONTRAST:  13mL OMNIPAQUE IOHEXOL 300 MG/ML  SOLN COMPARISON:  Most recent CT chest 05/08/2020.  01/16/2020 PET-CT. FINDINGS: Cardiovascular: Small pericardial effusion slightly diminished subjectively compared to recent comparison imaging. Aorta normal caliber with scattered atheromatous plaque. Central pulmonary vasculature unremarkable on venous phase aside from soft tissue encasing RIGHT hilar structures that shows a similar appearance to previous imaging Mediastinum/Nodes: RIGHT paramediastinal soft tissue blending with fibrosis in the RIGHT upper lobe. Subcarinal soft tissue and mildly enlarged lymph nodes in the subcarinal region measuring 16 mm greatest thickness, stable compared to previous imaging. No axillary lymphadenopathy. No thoracic inlet lymphadenopathy Lungs/Pleura: Cavitary area in the RIGHT upper lobe amidst post treatment changes measuring 5.2 x 4.0 cm, in communication with bronchial structures previously approximately 5.0 x 2.6 cm. Thick-walled changes seen posterior to this area are similar. Confluent soft tissue about the RIGHT hilum and along the medial RIGHT chest also with similar appearance. There is however pleural nodularity which is evident along the medial RIGHT chest, for instance 7 mm nodule on image 102 of series 2 following resolution of much of the pleural fluid that was present in small volume on the prior study in this location. Extrapleural fat with soft tissue extending along the anterior surface of the RIGHT posterior fourth rib (image 33, series 2) this has convex margins. Subtle pleural nodularity in the LEFT chest  not definitive, for instance on image 136 of series 2 of today's study 2-3 mm areas of nodularity along the extrapleural fat. Extensive ill-defined nodularity that was present on the previous study shows resolution of soft tissue component throughout the chest now with only ground-glass remaining in the mixed density areas that were seen previously. There is however a solid appearing small nodule in the posterior LEFT chest on image 96 of series 5 that measures 7 mm, potentially present previously and partially obscured by interstitial and airspace disease that was seen on the prior study. Upper Abdomen: Ill-defined area in the upper pole laterally of the RIGHT kidney seen on previous imaging, incompletely visualized. Imaged portions the liver, pancreas and spleen are unremarkable. The adrenal glands are normal. Imaged gastrointestinal tract without acute process. Musculoskeletal: No acute bone finding or destructive bone process IMPRESSION: 1. Extensive ill-defined nodularity that was present on the previous study shows resolution of soft tissue component throughout the chest now with only ground-glass remaining in the mixed density  areas that were seen previously. This may represent resolving infection or inflammation. Attention on follow-up, resolving drug reaction could also have this appearance. 2. Pleural thickening with convex margin in the RIGHT upper lobe along the posterior aspect of the treated area and bronchopleural fistula, findings raise the question of disease in the chest and are seen in the context of subtle pleural nodularity that is developing after resolution of much of the pleural fluid that was present on previous study. 3. Subtle pleural nodularity in the LEFT chest may be indicative of developing disease as well. Attention on follow-up is suggested. 4. Infiltrative partially imaged RIGHT renal lesion remains nonspecific, in the absence of known infection infiltrative metastatic lesion or  primary renal neoplasm are favored. Consider short interval follow-up as warranted and correlation with prior urine studies to determine whether infection is a viable differential consideration 5. Small LEFT lower lobe nodule, attention on follow-up. 6. Small pericardial effusion slightly diminished subjectively compared to recent comparison imaging. 7. Aortic atherosclerosis. Aortic Atherosclerosis (ICD10-I70.0). Electronically Signed   By: Zetta Bills M.D.   On: 07/21/2020 11:22   MR Abdomen W Wo Contrast  Result Date: 06/29/2020 CLINICAL DATA:  Characterize renal lesion, hematuria, history of lung cancer EXAM: MRI ABDOMEN WITHOUT AND WITH CONTRAST TECHNIQUE: Multiplanar multisequence MR imaging of the abdomen was performed both before and after the administration of intravenous contrast. CONTRAST:  7.50mL GADAVIST GADOBUTROL 1 MMOL/ML IV SOLN COMPARISON:  CT abdomen pelvis, 06/24/2020 FINDINGS: Lower chest: No acute findings. Hepatobiliary: No mass or other parenchymal abnormality identified. Pancreas: No mass, inflammatory changes, or other parenchymal abnormality identified. Spleen:  Within normal limits in size and appearance. Adrenals/Urinary Tract: There is a redemonstrated, ill-defined lesion of the lateral midportion of the right kidney, demonstrating heterogeneous intrinsic T1 and T2 signal with some evidence of fluid diffusion characteristics. There is ill-defined hypoenhancement of this lesion and most clearly defined components measure approximately 3.4 x 2.9 x 2.6 cm (series 15, image 27). No evidence of hydronephrosis. Stomach/Bowel: Visualized portions within the abdomen are unremarkable. Vascular/Lymphatic: No pathologically enlarged lymph nodes identified. No abdominal aortic aneurysm demonstrated. Other:  None. Musculoskeletal: No suspicious bone lesions identified. IMPRESSION: There is a redemonstrated, ill-defined, heterogeneous and hypoenhancing lesion of the lateral midportion of the  kidney, difficult to accurately measure but approximately 3.4 x 2.9 x 2.6 cm. Generally favor pyelonephritis and renal phlegmon given this appearance, which is not typical for renal cell carcinoma. Given history of lung cancer, metastasis to the renal parenchyma, although unusual, remains a substantial differential consideration. These results will be called to the ordering clinician or representative by the Radiologist Assistant, and communication documented in the PACS or [employer]. Electronically Signed   By: Eddie Candle M.D.   On: 06/29/2020 07:21   CT ABDOMEN PELVIS W CONTRAST  Result Date: 06/24/2020 CLINICAL DATA:  Gross hematuria.  Right lung carcinoma. EXAM: CT ABDOMEN AND PELVIS WITH CONTRAST TECHNIQUE: Multidetector CT imaging of the abdomen and pelvis was performed using the standard protocol following bolus administration of intravenous contrast. CONTRAST:  172mL OMNIPAQUE IOHEXOL 300 MG/ML  SOLN COMPARISON:  PET-CT on 01/16/2020 FINDINGS: Lower Chest: No acute findings. Hepatobiliary: No hepatic masses identified. Gallbladder is unremarkable. No evidence of biliary ductal dilatation. Pancreas:  No mass or inflammatory changes. Spleen: Within normal limits in size and appearance. Adrenals/Urinary Tract: Normal adrenal glands lesion with decreased attenuation is seen in the midpole of the right kidney which measures 3.8 x 3.3 cm on image 15/7. This appears  new since prior study approximately 6 months ago. Differential diagnosis includes pyelonephritis, renal cell carcinoma, and renal metastasis. No evidence of ureteral calculi or hydronephrosis. Unremarkable unopacified urinary bladder. Stomach/Bowel: No evidence of obstruction, inflammatory process or abnormal fluid collections. Vascular/Lymphatic: No pathologically enlarged lymph nodes. No evidence of renal vein or IVC thrombus. Aortic atherosclerotic calcification noted. No abdominal aortic aneurysm. Reproductive:  No mass or other  significant abnormality. Other:  None. Musculoskeletal:  No suspicious bone lesions identified. IMPRESSION: New 3.8 cm poorly defined low-attenuation lesion in midpole of right kidney. Differential diagnosis including pyelonephritis, renal cell carcinoma, and renal metastasis. Suggest correlation with urinalysis, and consider abdomen MRI without and with contrast for further characterization. No other masses or lymphadenopathy within the abdomen or pelvis. Aortic Atherosclerosis (ICD10-I70.0). Electronically Signed   By: Marlaine Hind M.D.   On: 06/24/2020 17:06    ASSESSMENT AND PLAN: This is a very pleasant 60 years old white male recently diagnosed with a stage IIb (T3, N0, M0) non-small cell lung cancer, adenocarcinoma presented with perihilar right upper lobe lung mass with suspicious groundglass opacity in the right middle lobe. The patient is not a good surgical candidate for resection according to Dr. Roxan Hockey. The patient underwent a course of concurrent chemoradiation with weekly carboplatin for AUC of 2 and paclitaxel 45 mg/M2 status post 5 cycles.  He has been tolerating this treatment well except for fatigue, mild odynophagia as well as recurrent pneumonia and septicemia.  The patient also has radiation-induced pneumonitis. He was admitted to the hospital several times with recurrent pneumonia.  The patient is feeling much better since his last visit with no further admission. He had repeat CT scan of the chest performed recently.  I personally and independently reviewed the scan images and discussed the results with the patient and his wife. His scan showed no evidence for disease progression and there was gradual resolution of the inflammatory process in the right lung. I recommended for the patient to continue on observation with repeat CT scan of the chest in 3 months. The patient was advised to call immediately if he has any other concerning symptoms in the interval. The patient voices  understanding of current disease status and treatment options and is in agreement with the current care plan.  All questions were answered. The patient knows to call the clinic with any problems, questions or concerns. We can certainly see the patient much sooner if necessary.  Disclaimer: This note was dictated with voice recognition software. Similar sounding words can inadvertently be transcribed and may not be corrected upon review.

## 2020-07-26 ENCOUNTER — Encounter: Payer: Self-pay | Admitting: Internal Medicine

## 2020-07-27 ENCOUNTER — Ambulatory Visit: Payer: BC Managed Care – PPO | Admitting: Pulmonary Disease

## 2020-07-27 ENCOUNTER — Encounter: Payer: Self-pay | Admitting: Pulmonary Disease

## 2020-07-27 ENCOUNTER — Other Ambulatory Visit: Payer: Self-pay

## 2020-07-27 VITALS — BP 118/70 | HR 91 | Temp 98.1°F | Ht 70.0 in | Wt 173.8 lb

## 2020-07-27 DIAGNOSIS — J189 Pneumonia, unspecified organism: Secondary | ICD-10-CM

## 2020-07-27 DIAGNOSIS — C3491 Malignant neoplasm of unspecified part of right bronchus or lung: Secondary | ICD-10-CM

## 2020-07-27 MED ORDER — COMBIVENT RESPIMAT 20-100 MCG/ACT IN AERS: 1.0000 | INHALATION_SPRAY | Freq: Four times a day (QID) | RESPIRATORY_TRACT | 3 refills | Status: DC | PRN

## 2020-07-27 MED ORDER — DOXYCYCLINE HYCLATE 100 MG PO TABS: 100.0000 mg | ORAL_TABLET | Freq: Two times a day (BID) | ORAL | 0 refills | Status: AC

## 2020-07-27 NOTE — Patient Instructions (Signed)
Nice to see you again, I am glad you continue to feel okay  I sent a refill for doxycycline 1 pill twice a day.  I provided additional 4 weeks of treatment.  Given the CT scan improvement a week ago, I anticipate that we can stop the doxycycline after this next 4 weeks.  However, we can always reevaluate and resume if symptoms were to return.  I recommend we do not resume the prednisone at this time.  Return to clinic in 2 months for follow-up with Dr. Silas Flood

## 2020-07-28 ENCOUNTER — Encounter: Payer: Self-pay | Admitting: Medical Oncology

## 2020-07-28 NOTE — Telephone Encounter (Signed)
Please advise on patient mychart message  Richard Li is asking for a description of my health issues that are holding me back from returning to work. They need detailed information regarding the issues with my lungs. Can you put a letter into My Chart so we can print it out and send to them? Thank you.

## 2020-07-28 NOTE — Progress Notes (Signed)
_0  ID: Richard Li, male    DOB: 1960-05-02, 60 y.o.   MRN: [id number]  Chief Complaint  Patient presents with  . Follow-up    2 mo f/u. States he is improving each day. Still taking Doxycycline, current dose will end on Thursday. Last dose of prednisone today. Has been using his inhalers daily.     Referring provider: [name], DO  HPI:   60 year old man with recently diagnosed right-sided lung cancer 12/2019 via bronchoscopy/EBUS who spent essentially all of November in the hospital for recurrent right-sided pneumonia whom are seeing in hospital follow-up after another admission 04/2020.  Extensive notes from multiple hospitalizations reviewed.  Patient return to clinic today.  He states he continues to improve.  Still has productive cough but much less than even before last visit which is markedly improved from baseline.  Still some in the morning.  Still bringing up gray the foul-smelling sputum but overall less frequent and less amount even compared to last visit and certainly much better overall than baseline.  Recently seen by Dr. Earlie Server 07/21/2020 with CT scan showed no progression of disease, continue monitoring.  On my interpretation and review of the film right middle lobe and right lower lobe infiltrates have markedly improved and nearly resolved with persistent right upper lobe consolidation given air bronchograms and component of atelectasis.   HPI at initial visit: Started with productive cough it seems early November.  This is prompted 4 separate hospitalizations and multiple antibiotic courses.  He had recently completed first cycle of carboplatin/paclitaxel.  Was in the midst of radiation therapy to the right upper lobe mass which he completed around Thanksgiving 2021.  He has had cough productive of green sputum.  It is foul tasting.  Smells bad.  Despite multiple rounds of antibiotic is not improved.  His shortness of breath is worsening.  The most recent  hospitalization 03/2020 a bronchoscopy was pursued. Mucopurulent secretions noted emanating from RUL. Secretions likely aspirated seen in BI, RLL. Culture with OP flora. No cell counts sent.  Continually coughing, producing sputum as above. Worried because not getting chemotherapy for cancer. Recently completed coures 14 day prednisone for possible radiation pneumonitis. This did not help symptoms. Finishing course of augmentin. Not seen improvement.  Serial CXRs reviewed which show persistent and worsening R mid and upper lung field opacities. CT 02/2020 reviewed with necrotic appearing mass near RUL takeoff centrally, dense consolidations posterior RUL and throughout R with scattered GGOs in left lung.   Questionaires / Pulmonary Flowsheets:   ACT:  No flowsheet data found.  MMRC: mMRC Dyspnea Scale mMRC Score  06/05/2020 2    Epworth:  No flowsheet data found.  Tests:   FENO:  No results found for: NITRICOXIDE  PFT: No flowsheet data found.  WALK:  No flowsheet data found.  Imaging: Personally reviewed and as per EMR and discussion this note  CT Chest W Contrast  Result Date: 07/21/2020 CLINICAL DATA:  Primary Cancer Type: Lung Imaging Indication: Routine surveillance Interval therapy since last imaging? No Initial Cancer Diagnosis Date: 01/07/2020; Established by: Biopsy-proven Detailed Pathology: Stage IIB non-small cell lung cancer, adenocarcinoma. Primary Tumor location:  Perihilar right lung mass. Surgeries: Appendectomy. Chemotherapy: Yes; Ongoing?  No; Most recent administration: 02/2020 Immunotherapy? No Radiation therapy? Yes; Date Range: 02/12/2020 - 03/24/2020; Target: Chest EXAM: CT CHEST WITH CONTRAST TECHNIQUE: Multidetector CT imaging of the chest was performed during intravenous contrast administration. CONTRAST:  50m OMNIPAQUE IOHEXOL 300 MG/ML  SOLN COMPARISON:  Most  recent CT chest 05/08/2020.  01/16/2020 PET-CT. FINDINGS: Cardiovascular: Small pericardial  effusion slightly diminished subjectively compared to recent comparison imaging. Aorta normal caliber with scattered atheromatous plaque. Central pulmonary vasculature unremarkable on venous phase aside from soft tissue encasing RIGHT hilar structures that shows a similar appearance to previous imaging Mediastinum/Nodes: RIGHT paramediastinal soft tissue blending with fibrosis in the RIGHT upper lobe. Subcarinal soft tissue and mildly enlarged lymph nodes in the subcarinal region measuring 16 mm greatest thickness, stable compared to previous imaging. No axillary lymphadenopathy. No thoracic inlet lymphadenopathy Lungs/Pleura: Cavitary area in the RIGHT upper lobe amidst post treatment changes measuring 5.2 x 4.0 cm, in communication with bronchial structures previously approximately 5.0 x 2.6 cm. Thick-walled changes seen posterior to this area are similar. Confluent soft tissue about the RIGHT hilum and along the medial RIGHT chest also with similar appearance. There is however pleural nodularity which is evident along the medial RIGHT chest, for instance 7 mm nodule on image 102 of series 2 following resolution of much of the pleural fluid that was present in small volume on the prior study in this location. Extrapleural fat with soft tissue extending along the anterior surface of the RIGHT posterior fourth rib (image 33, series 2) this has convex margins. Subtle pleural nodularity in the LEFT chest not definitive, for instance on image 136 of series 2 of today's study 2-3 mm areas of nodularity along the extrapleural fat. Extensive ill-defined nodularity that was present on the previous study shows resolution of soft tissue component throughout the chest now with only ground-glass remaining in the mixed density areas that were seen previously. There is however a solid appearing small nodule in the posterior LEFT chest on image 96 of series 5 that measures 7 mm, potentially present previously and partially  obscured by interstitial and airspace disease that was seen on the prior study. Upper Abdomen: Ill-defined area in the upper pole laterally of the RIGHT kidney seen on previous imaging, incompletely visualized. Imaged portions the liver, pancreas and spleen are unremarkable. The adrenal glands are normal. Imaged gastrointestinal tract without acute process. Musculoskeletal: No acute bone finding or destructive bone process IMPRESSION: 1. Extensive ill-defined nodularity that was present on the previous study shows resolution of soft tissue component throughout the chest now with only ground-glass remaining in the mixed density areas that were seen previously. This may represent resolving infection or inflammation. Attention on follow-up, resolving drug reaction could also have this appearance. 2. Pleural thickening with convex margin in the RIGHT upper lobe along the posterior aspect of the treated area and bronchopleural fistula, findings raise the question of disease in the chest and are seen in the context of subtle pleural nodularity that is developing after resolution of much of the pleural fluid that was present on previous study. 3. Subtle pleural nodularity in the LEFT chest may be indicative of developing disease as well. Attention on follow-up is suggested. 4. Infiltrative partially imaged RIGHT renal lesion remains nonspecific, in the absence of known infection infiltrative metastatic lesion or primary renal neoplasm are favored. Consider short interval follow-up as warranted and correlation with prior urine studies to determine whether infection is a viable differential consideration 5. Small LEFT lower lobe nodule, attention on follow-up. 6. Small pericardial effusion slightly diminished subjectively compared to recent comparison imaging. 7. Aortic atherosclerosis. Aortic Atherosclerosis (ICD10-I70.0). Electronically Signed   By: Zetta Bills M.D.   On: 07/21/2020 11:22    Lab Results: Personally  reviewed CBC    Component Value  Date/Time   WBC 8.2 07/20/2020 1520   WBC 11.9 (H) 06/24/2020 1517   RBC 4.18 (L) 07/20/2020 1520   HGB 10.6 (L) 07/20/2020 1520   HCT 34.8 (L) 07/20/2020 1520   PLT 278 07/20/2020 1520   MCV 83.3 07/20/2020 1520   MCH 25.4 (L) 07/20/2020 1520   MCHC 30.5 07/20/2020 1520   RDW 17.4 (H) 07/20/2020 1520   LYMPHSABS 1.1 07/20/2020 1520   MONOABS 0.6 07/20/2020 1520   EOSABS 0.1 07/20/2020 1520   BASOSABS 0.0 07/20/2020 1520    BMET    Component Value Date/Time   NA 139 07/20/2020 1520   K 4.6 07/20/2020 1520   CL 103 07/20/2020 1520   CO2 27 07/20/2020 1520   GLUCOSE 106 (H) 07/20/2020 1520   BUN 12 07/20/2020 1520   CREATININE 0.84 07/20/2020 1520   CREATININE 0.67 (L) 06/24/2020 1517   CALCIUM 9.3 07/20/2020 1520   GFRNONAA >60 07/20/2020 1520   GFRNONAA 105 06/24/2020 1517   GFRAA 122 06/24/2020 1517    BNP No results found for: BNP  ProBNP No results found for: PROBNP  Specialty Problems      Pulmonary Problems   Allergic rhinitis    Qualifier: Diagnosis of  By: Julien Girt CMA, Leigh        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[place])    Qualifier: Diagnosis of  By: Julien Girt CMA, Leigh        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   Pulmonary nodules/lesions, multiple   Adenocarcinoma of right lung, stage 2 (HCC)   Sinus congestion   SOB (shortness of breath)   Recurrent pneumonia      Allergies  Allergen Reactions  . Amoxicillin Rash    Immunization History  Administered Date(s) Administered  . Influenza Split 01/25/2012  . Influenza Whole 02/13/2009, 01/25/2010  . Influenza,inj,Quad PF,6+ Mos 03/30/2015, 02/16/2016, 02/27/2017, 01/24/2018, 03/01/2019, 02/10/2020  . Influenza,inj,quad, With Preservative 01/23/2017  . Tdap 12/12/2011    Past Medical History:  Diagnosis Date  . Allergic rhinitis, cause unspecified   . Anxiety state, unspecified   . Asthma    as a child  .  Chronic airway obstruction, not elsewhere classified   . Esophageal reflux   . History of kidney stones   . Hypertension   . Lumbago   . lung ca dx'd 12/2019  . Other and unspecified hyperlipidemia   . Other chest pain     Tobacco History: Social History   Tobacco Use  Smoking Status Former Smoker  . Packs/day: 2.00  . Years: 28.00  . Pack years: 56.00  . Types: Cigarettes  . Quit date: 04/25/2005  . Years since quitting: 15.2  Smokeless Tobacco Never Used   Counseling given: Not Answered   Continue to not smoke  Outpatient Encounter Medications as of 07/27/2020  Medication Sig  . acetaminophen (TYLENOL) 500 MG tablet Take 1,000 mg by mouth 2 (two) times daily.  [name] dextromethorphan-guaiFENesin (MUCINEX DM) 30-600 MG 12hr tablet Take 1 tablet by mouth 2 (two) times daily.  . feeding supplement (ENSURE ENLIVE / ENSURE PLUS) LIQD Take 237 mLs by mouth 3 (three) times daily between meals.  . fenofibrate 160 MG tablet Take 1 tablet by mouth once daily  . ferrous sulfate 325 (65 FE) MG tablet Take 1 tablet (325 mg total) by mouth daily with breakfast.  . folic acid (FOLVITE) 1 MG tablet Take 1 tablet (1 mg total) by mouth daily.  [name] LORazepam (ATIVAN) 1  MG tablet TAKE 1 TABLET BY MOUTH THREE TIMES DAILY AS NEEDED FOR ANXIETY (Patient taking differently: Take 1 mg by mouth See admin instructions. Take one tablet (1 mg) by mouth daily at bedtime, may also take one tablet (1 mg) twice during the day as needed for anxiety)  . meclizine (ANTIVERT) 25 MG tablet Take 1 tablet (25 mg total) by mouth every 6 (six) hours as needed for dizziness.  . Multiple Vitamin (MULTIVITAMIN WITH MINERALS) TABS tablet Take 1 tablet by mouth daily.  . prochlorperazine (COMPAZINE) 10 MG tablet TAKE 1 TABLET BY MOUTH EVERY 6 HOURS AS NEEDED FOR NAUSEA FOR VOMITING (Patient taking differently: Take 10 mg by mouth every 6 (six) hours as needed for nausea or vomiting.)  . [DISCONTINUED] doxycycline (VIBRA-TABS) 100 MG  tablet Take 1 tablet (100 mg total) by mouth 2 (two) times daily.  . [DISCONTINUED] Ipratropium-Albuterol (COMBIVENT RESPIMAT) 20-100 MCG/ACT AERS respimat Inhale 1-2 puffs into the lungs every 6 (six) hours as needed for wheezing.  . [DISCONTINUED] predniSONE (DELTASONE) 10 MG tablet Take 1 tablet (10 mg total) by mouth daily with breakfast.  . doxycycline (VIBRA-TABS) 100 MG tablet Take 1 tablet (100 mg total) by mouth 2 (two) times daily for 28 days.  . Ipratropium-Albuterol (COMBIVENT RESPIMAT) 20-100 MCG/ACT AERS respimat Inhale 1-2 puffs into the lungs every 6 (six) hours as needed for wheezing.  . [DISCONTINUED] doxycycline (VIBRA-TABS) 100 MG tablet Take 1 tablet by mouth twice daily  . [DISCONTINUED] potassium chloride SA (KLOR-CON) 20 MEQ tablet Take 2 tablets (40 mEq total) by mouth daily.  . [DISCONTINUED] sucralfate (CARAFATE) 1 g tablet Take 1 tablet (1 g total) by mouth 4 (four) times daily -  with meals and at bedtime. (Patient not taking: Reported on 03/05/2020)   Facility-Administered Encounter Medications as of 07/27/2020  Medication  . 0.9 %  sodium chloride infusion     Review of Systems n/a  Physical Exam  BP 118/70   Pulse 91   Temp 98.1 F (36.7 C) (Temporal)   Ht _0  (1.778 m)   Wt 173 lb 12.8 oz (78.8 kg)   SpO2 100% Comment: on RA  BMI 24.94 kg/m   Wt Readings from Last 5 Encounters:  07/27/20 173 lb 12.8 oz (78.8 kg)  07/22/20 175 lb (79.4 kg)  06/24/20 164 lb 1.9 oz (74.4 kg)  06/05/20 160 lb (72.6 kg)  05/28/20 162 lb (73.5 kg)    BMI Readings from Last 5 Encounters:  07/27/20 24.94 kg/m  07/22/20 25.11 kg/m  06/24/20 23.55 kg/m  06/05/20 22.96 kg/m  05/28/20 23.24 kg/m     Physical Exam General: Sitting up in exam chair, in NAD Eyes: EOMI, no icterus Respiratory: Coarse bronchial breath sounds on inspiration throughout the right particularly mid to upper lung fields, left clear, both improved from prior Cardiovascular: Borderline  tachycardic, no murmurs Extremities: No edema, warm   Assessment & Plan:   Productive cough, CT/CXR infiltrate: Multiple hospitalizations for the same. BAL with OP flora. Unfortunately no cell counts sent. Repeat Sputum culture with same. Given location of tumor and images from bronch, fear obstruction of portion of upper lobe leading to post-obstructive pneumonia and lack of resolution. Slowly improved over time. CT 07/21/20 with improved RML and RLL infiltrates with persistent mixed consolidation and atelectasis RUL. Still with productive cough. Continue doxycycline for 4 more weeks, Suspect can stop then but will re-evaluate symptoms. Stop prednisone.   Dyspnea exertion, weakness: In the setting of lung cancer with severe  deconditioning due to therapy as well as severe deconditioning due to prolonged pulmonary infection over the last several months requires ongoing treatment.  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 His weakness would be a risk to himself as well as other driver/computers on the road where he to return to work at this time.   Return in about 2 months (around 09/26/2020).   Lanier Clam, MD 07/28/2020   This appointment required 34 minutes of patient care (this includes precharting, chart review, review of results, face-to-face care, etc.).

## 2020-08-03 ENCOUNTER — Encounter: Payer: Self-pay | Admitting: Internal Medicine

## 2020-08-04 MED ORDER — PREDNISONE 10 MG PO TABS: 10.0000 mg | ORAL_TABLET | Freq: Every day | ORAL | 1 refills | Status: DC

## 2020-08-04 NOTE — Telephone Encounter (Signed)
Resume prednisone 10 mg daily. Patient informed via MyChart message.

## 2020-08-04 NOTE — Telephone Encounter (Signed)
Received following message from patient:    Just a follow up from the last visit on 4/4. Since stopping the prednisone his cough has increased, the mucus has increased and he's having trouble getting the mucus up and out. He's wanting to know what you suggest.   Thank you,  Richard Li      Prednisone       [facility], can you please advise? Thanks!

## 2020-08-09 ENCOUNTER — Other Ambulatory Visit: Payer: Self-pay | Admitting: Family Medicine

## 2020-08-09 DIAGNOSIS — F411 Generalized anxiety disorder: Secondary | ICD-10-CM

## 2020-08-14 ENCOUNTER — Other Ambulatory Visit (HOSPITAL_COMMUNITY): Payer: Self-pay | Admitting: Urology

## 2020-08-14 DIAGNOSIS — D3001 Benign neoplasm of right kidney: Secondary | ICD-10-CM

## 2020-08-17 ENCOUNTER — Telehealth: Payer: Self-pay | Admitting: Pulmonary Disease

## 2020-08-17 NOTE — Telephone Encounter (Signed)
Rec'd faxed disability forms from patient wife. Updated forms and faxed to attn of Berna Bue at [employer] at [phone]

## 2020-08-18 ENCOUNTER — Encounter (HOSPITAL_COMMUNITY): Payer: Self-pay

## 2020-08-18 NOTE — Progress Notes (Signed)
[place] Male, 60 y.o., [birth year]  MRN:  [id number] Phone:  [phone] Jerilynn Mages)       PCP:  [name], DO Coverage:  Sherre Poot [employer]  Next Appt With Oncology 10/20/2020 at 9:00 AM           RE: Biopsy Received: 4 days ago  Message Details  Arne Cleveland, MD  Lenore Cordia Ok   CT core R renal mass  Met v rcca  Consider sending for culture too    DDH    Previous Messages  ----- Message -----  From: Lenore Cordia  Sent: 08/14/2020  3:57 PM EDT  To: Ir Procedure Requests  Subject: Biopsy                      Procedure Requested: Right Renal Biopsy     Reason for Procedure: Benign tumor right kidney    Provider Requesting:  Rexene Alberts  Provider Telephone:  [phone]

## 2020-08-20 ENCOUNTER — Other Ambulatory Visit: Payer: Self-pay

## 2020-08-20 ENCOUNTER — Ambulatory Visit (HOSPITAL_COMMUNITY)
Admission: RE | Admit: 2020-08-20 | Discharge: 2020-08-20 | Disposition: A | Discharge status: Home / Self Care | Payer: BC Managed Care – PPO | Source: Ambulatory Visit | Attending: Urology | Admitting: Urology

## 2020-08-20 DIAGNOSIS — Z85118 Personal history of other malignant neoplasm of bronchus and lung: Secondary | ICD-10-CM | POA: Diagnosis not present

## 2020-08-20 DIAGNOSIS — C7901 Secondary malignant neoplasm of right kidney and renal pelvis: Secondary | ICD-10-CM | POA: Insufficient documentation

## 2020-08-20 DIAGNOSIS — D3001 Benign neoplasm of right kidney: Secondary | ICD-10-CM | POA: Diagnosis present

## 2020-08-20 LAB — CBC
HCT: 36.6 % — ABNORMAL LOW (ref 39.0–52.0)
Hemoglobin: 11.4 g/dL — ABNORMAL LOW (ref 13.0–17.0)
MCH: 26.8 pg (ref 26.0–34.0)
MCHC: 31.1 g/dL (ref 30.0–36.0)
MCV: 85.9 fL (ref 80.0–100.0)
Platelets: 280 10*3/uL (ref 150–400)
RBC: 4.26 MIL/uL (ref 4.22–5.81)
RDW: 17.7 % — ABNORMAL HIGH (ref 11.5–15.5)
WBC: 7.9 10*3/uL (ref 4.0–10.5)
nRBC: 0 % (ref 0.0–0.2)

## 2020-08-20 LAB — PROTIME-INR
INR: 1 (ref 0.8–1.2)
Prothrombin Time: 13.3 seconds (ref 11.4–15.2)

## 2020-08-20 LAB — APTT: aPTT: 29 seconds (ref 24–36)

## 2020-08-20 IMAGING — CT CT BIOPSY CORE RENAL
1 of 2 series · 13 of 32 positions shown, 18 images · non-contrast
Comparison: none

INDICATION: 59-year-old male with an irregular mass in the interpolar right
kidney. Patient has a history of pulmonary adenocarcinoma. Findings
are concerning for urothelial carcinoma versus metastatic lesion to
the kidney. Patient presents for image guided core biopsy

[Series 2: i-spiral 5.0 b40f · axial · 0.89mm/px · z∈[+570,+818]mm · 13 of 79 slices shown, 18 images]
[im 4/79  soft-tissue]
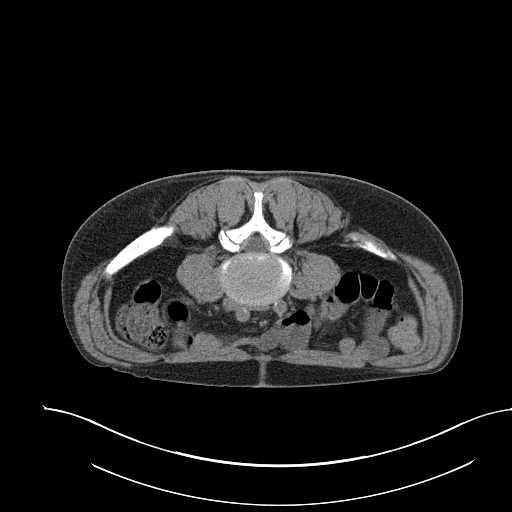
[im 4/79  bone]
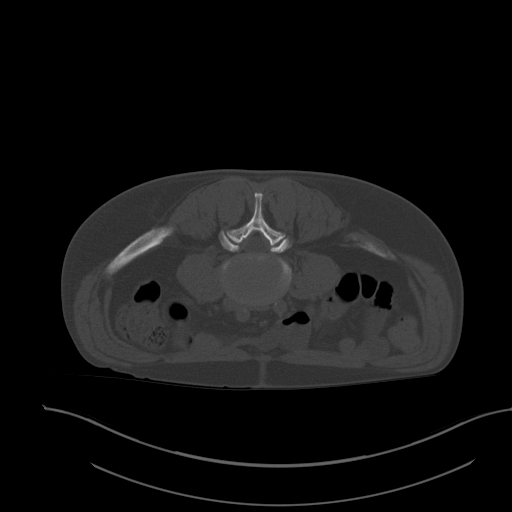
[im 12/79  soft-tissue]
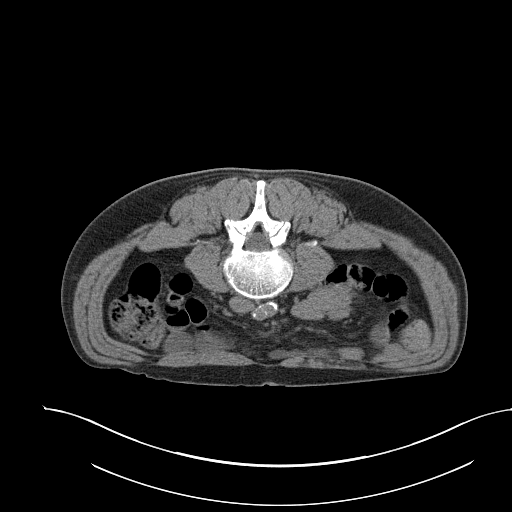
[im 16/79  soft-tissue]
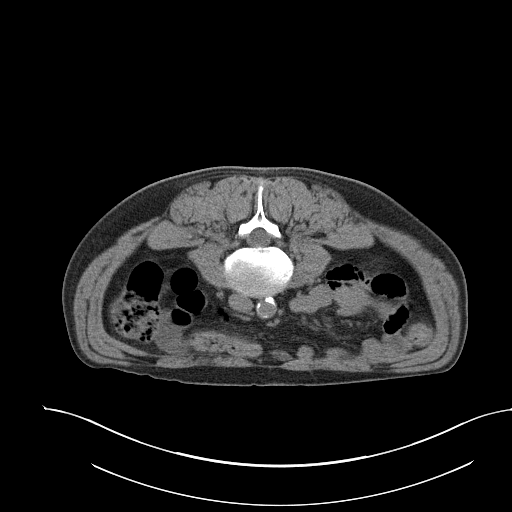
[im 24/79  soft-tissue]
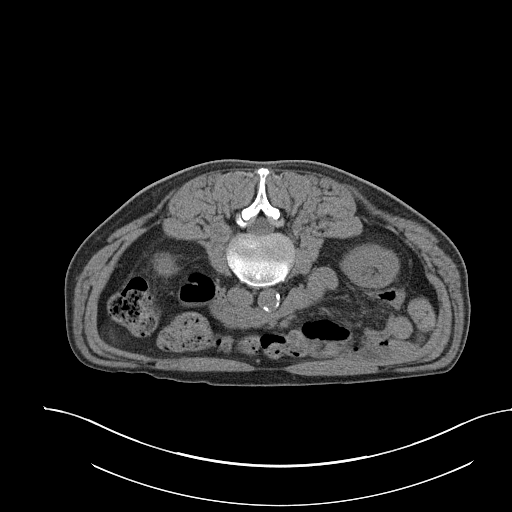
[im 32/79  soft-tissue]
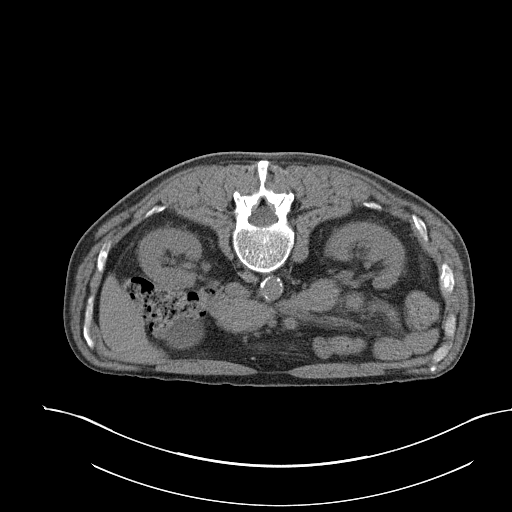
[im 36/79  soft-tissue]
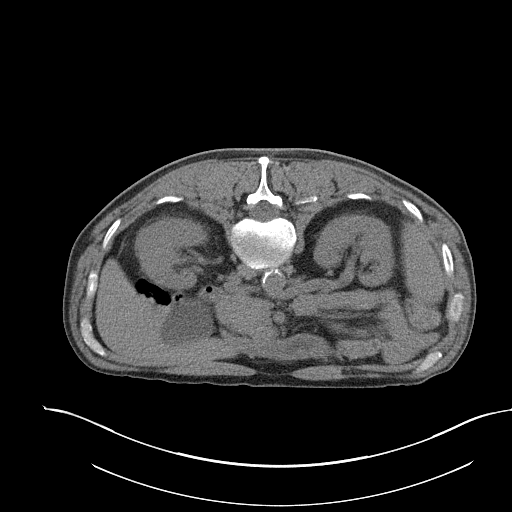
[im 43/79  soft-tissue]
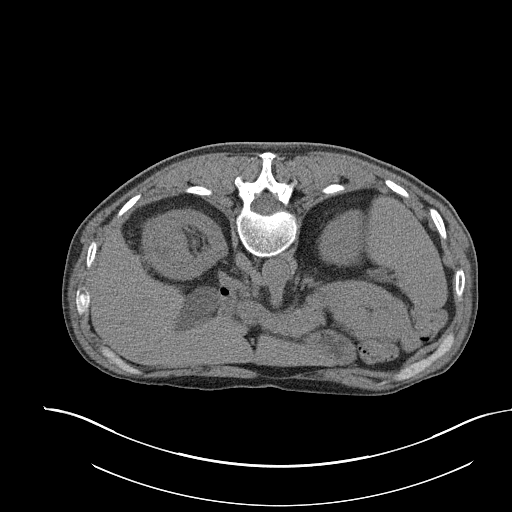
[im 47/79  soft-tissue]
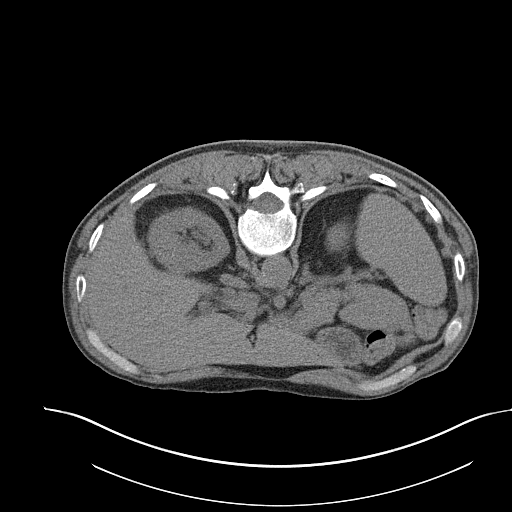
[im 55/79  soft-tissue]
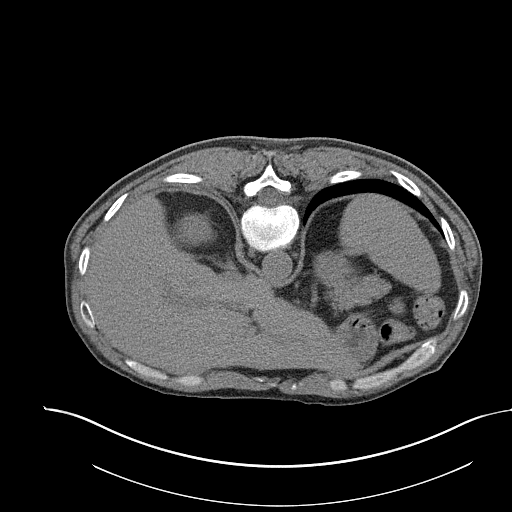
[im 55/79  bone]
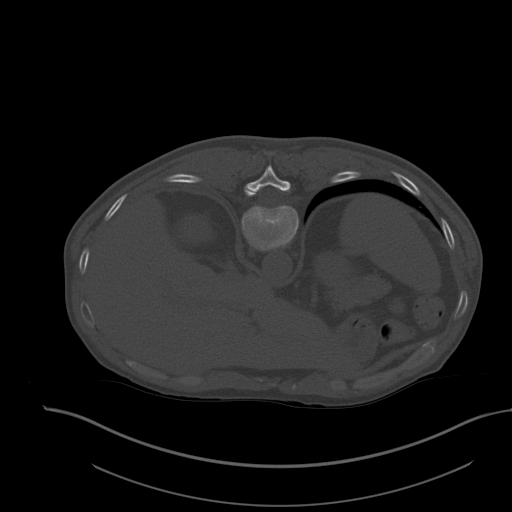
[im 63/79  soft-tissue]
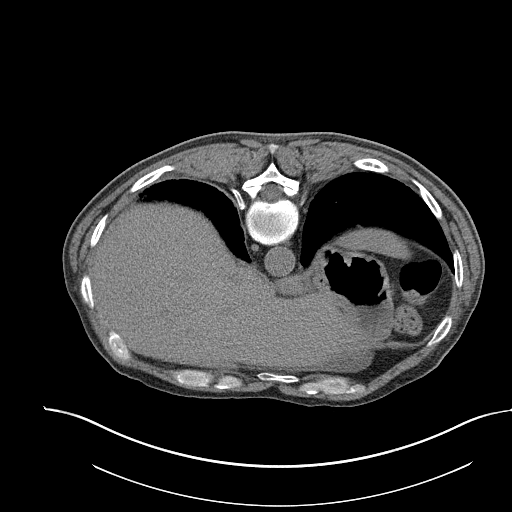
[im 63/79  lung]
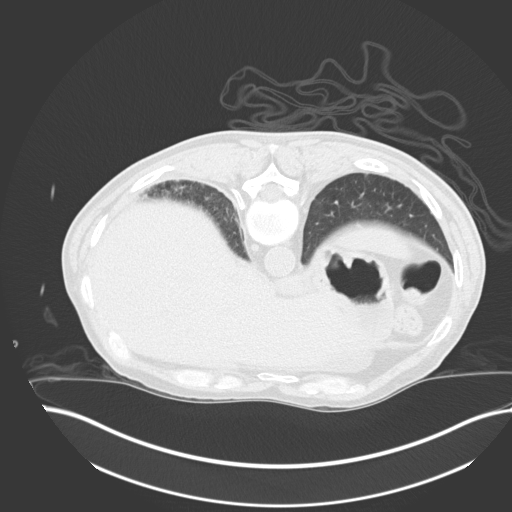
[im 67/79  soft-tissue]
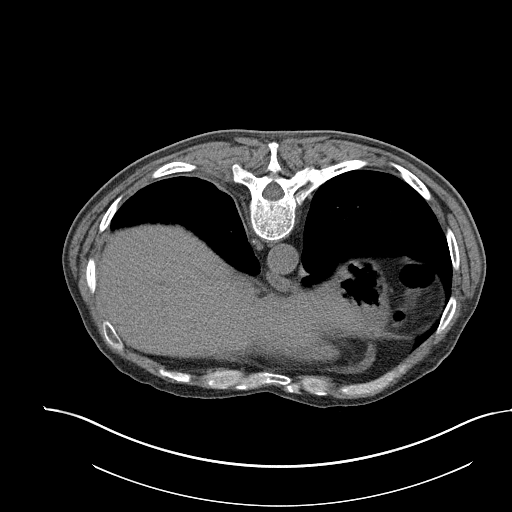
[im 67/79  lung]
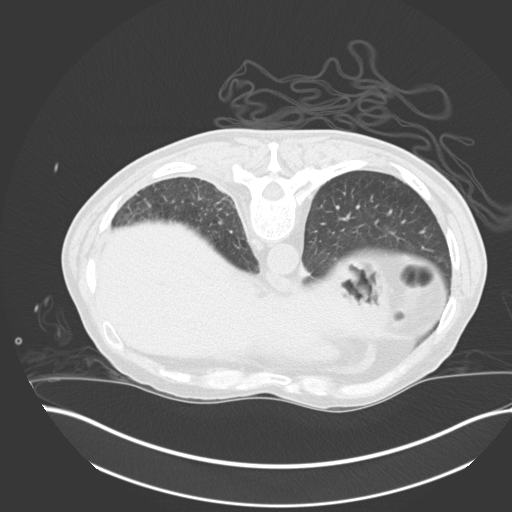
[im 71/79  lung]
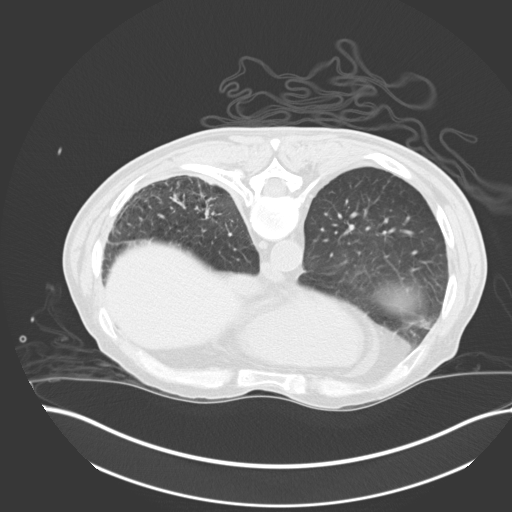
[im 75/79  soft-tissue]
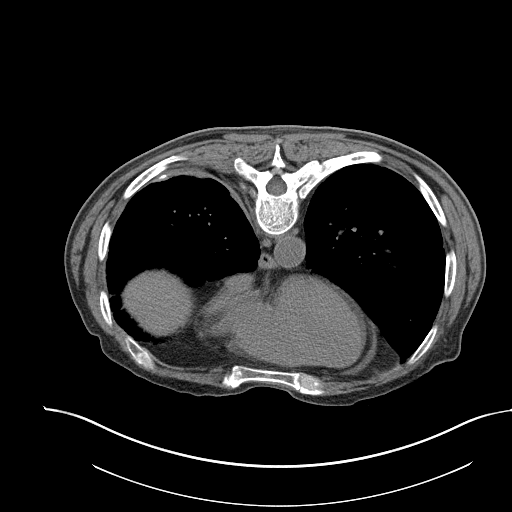
[im 75/79  lung]
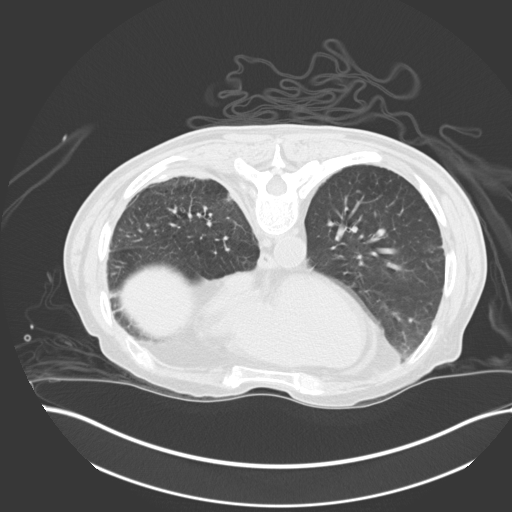

[13 of 32 positions shown; findings below may reference images not displayed]

EXAM:
CT BIOPSY CORE RENAL

MEDICATIONS:
None.

ANESTHESIA/SEDATION:
Moderate (conscious) sedation was employed during this procedure. A
total of Versed 1 mg and Fentanyl 50 mcg was administered
intravenously.

Moderate Sedation Time: 15 minutes. The patient's level of
consciousness and vital signs were monitored continuously by
radiology nursing throughout the procedure under my direct
supervision.

FLUOROSCOPY TIME:  None

COMPLICATIONS:
None immediate.

PROCEDURE:
Informed written consent was obtained from the patient after a
thorough discussion of the procedural risks, benefits and
alternatives. All questions were addressed. Maximal Sterile Barrier
Technique was utilized including caps, mask, sterile gowns, sterile
gloves, sterile drape, hand hygiene and skin antiseptic. A timeout
was performed prior to the initiation of the procedure.

Initial CT imaging was performed. The mass is nearly occult by CT.
Therefore, ultrasound was performed while the patient was on the CT
gantry. Ultrasound demonstrates the irregular mass in the central
aspect of the kidney. A suitable skin entry site was selected and
marked. The skin was sterilely prepped and draped in the standard
fashion using chlorhexidine skin prep. Local anesthesia was attained
by infiltration with 1% lidocaine. A small dermatotomy was made.
Under real-time ultrasound guidance, an 18 gauge trocar needle was
advanced into the margin of the mass. Multiple 18 gauge core
biopsies were then obtained coaxially using the SHANTY automated
biopsy device. Biopsy specimens were placed in formalin and
delivered to pathology for further analysis.

Repeat CT imaging was performed demonstrating a very small amount of
perinephric hemorrhage. This is not unexpected. Patient remained
clinically asymptomatic.
IMPRESSION: Technically successful combined ultrasound and CT guided biopsy of
right renal mass.

## 2020-08-20 IMAGING — US CT BIOPSY CORE RENAL
1 series · 4 of 4 positions shown · non-contrast
Comparison: none

INDICATION: 59-year-old male with an irregular mass in the interpolar right
kidney. Patient has a history of pulmonary adenocarcinoma. Findings
are concerning for urothelial carcinoma versus metastatic lesion to
the kidney. Patient presents for image guided core biopsy

[Series 1: renal us · 4 of 4 slices shown]
[im 1/4]
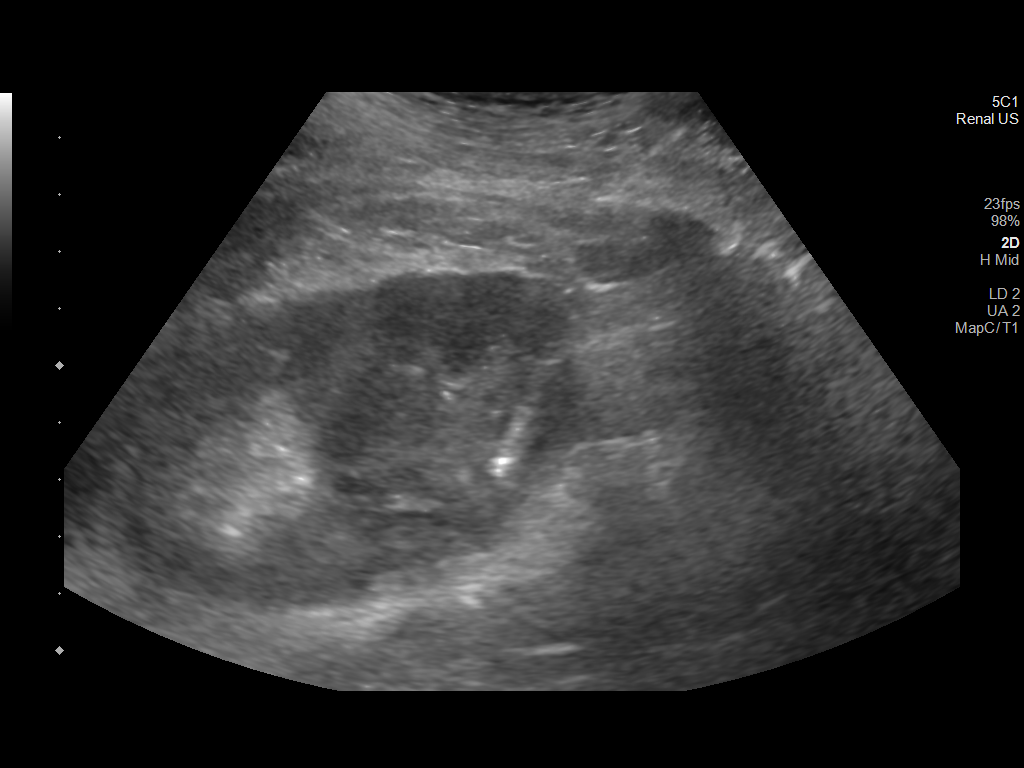
[im 2/4]
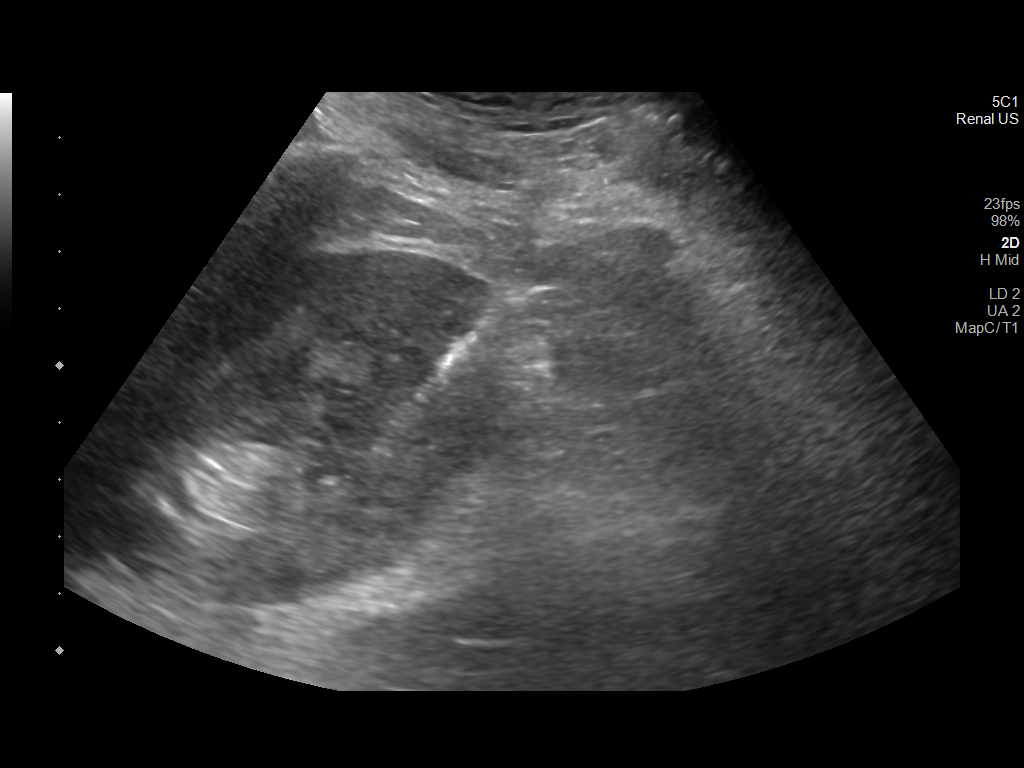
[im 3/4]
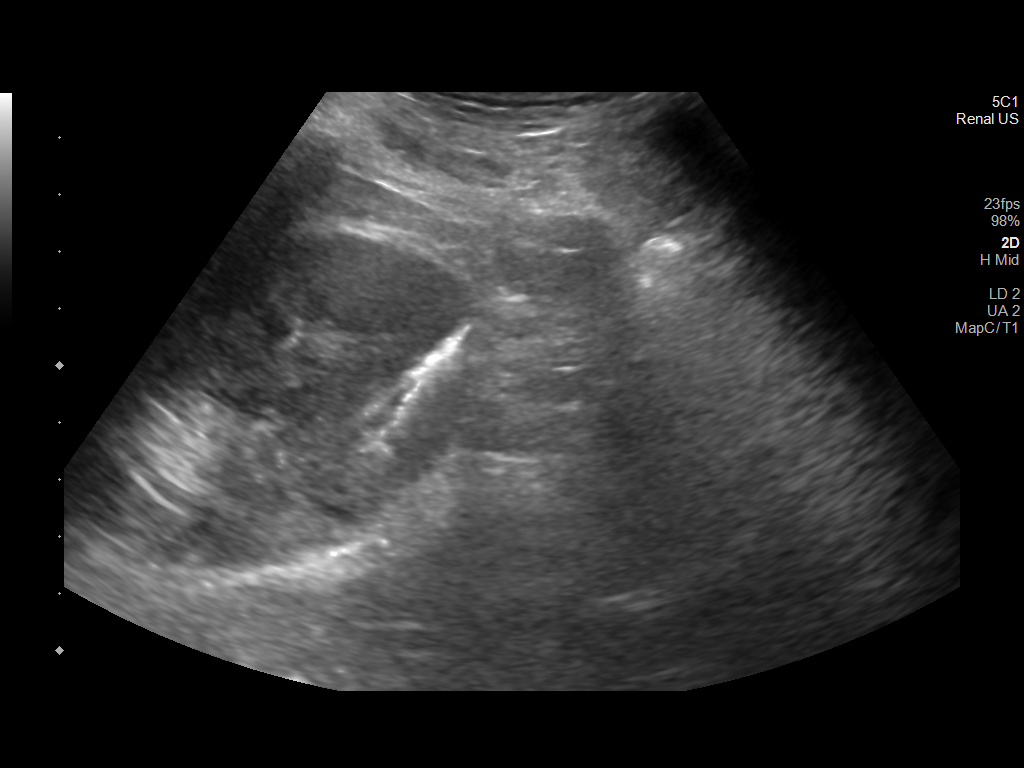
[im 4/4]
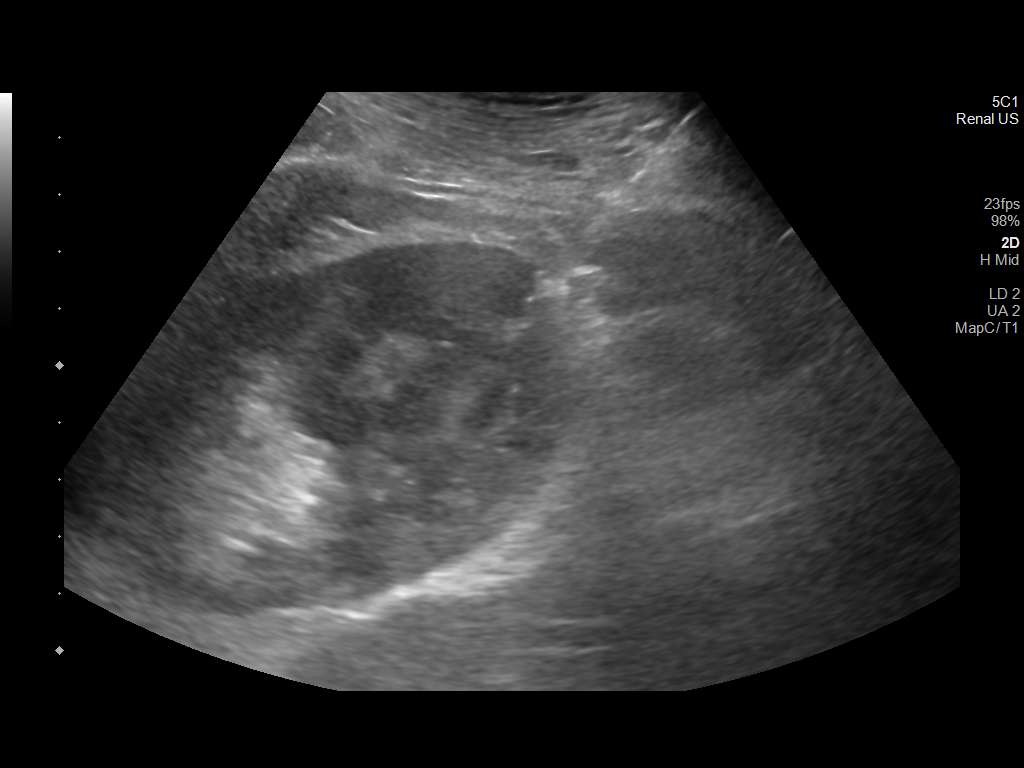

[4 of 4 positions shown; findings below may reference images not displayed]

EXAM:
CT BIOPSY CORE RENAL

MEDICATIONS:
None.

ANESTHESIA/SEDATION:
Moderate (conscious) sedation was employed during this procedure. A
total of Versed 1 mg and Fentanyl 50 mcg was administered
intravenously.

Moderate Sedation Time: 15 minutes. The patient's level of
consciousness and vital signs were monitored continuously by
radiology nursing throughout the procedure under my direct
supervision.

FLUOROSCOPY TIME:  None

COMPLICATIONS:
None immediate.

PROCEDURE:
Informed written consent was obtained from the patient after a
thorough discussion of the procedural risks, benefits and
alternatives. All questions were addressed. Maximal Sterile Barrier
Technique was utilized including caps, mask, sterile gowns, sterile
gloves, sterile drape, hand hygiene and skin antiseptic. A timeout
was performed prior to the initiation of the procedure.

Initial CT imaging was performed. The mass is nearly occult by CT.
Therefore, ultrasound was performed while the patient was on the CT
gantry. Ultrasound demonstrates the irregular mass in the central
aspect of the kidney. A suitable skin entry site was selected and
marked. The skin was sterilely prepped and draped in the standard
fashion using chlorhexidine skin prep. Local anesthesia was attained
by infiltration with 1% lidocaine. A small dermatotomy was made.
Under real-time ultrasound guidance, an 18 gauge trocar needle was
advanced into the margin of the mass. Multiple 18 gauge core
biopsies were then obtained coaxially using the SHANTY automated
biopsy device. Biopsy specimens were placed in formalin and
delivered to pathology for further analysis.

Repeat CT imaging was performed demonstrating a very small amount of
perinephric hemorrhage. This is not unexpected. Patient remained
clinically asymptomatic.
IMPRESSION: Technically successful combined ultrasound and CT guided biopsy of
right renal mass.

## 2020-08-20 MED ORDER — MIDAZOLAM HCL 2 MG/2ML IJ SOLN
INTRAMUSCULAR | Status: AC
  Filled 2020-08-20: qty 2

## 2020-08-20 MED ORDER — FENTANYL CITRATE (PF) 100 MCG/2ML IJ SOLN
INTRAMUSCULAR | Status: AC | PRN
  Administered 2020-08-20: 50 ug via INTRAVENOUS

## 2020-08-20 MED ORDER — LIDOCAINE HCL 1 % IJ SOLN
INTRAMUSCULAR | Status: AC
  Filled 2020-08-20: qty 20

## 2020-08-20 MED ORDER — FENTANYL CITRATE (PF) 100 MCG/2ML IJ SOLN
INTRAMUSCULAR | Status: AC
  Filled 2020-08-20: qty 2

## 2020-08-20 MED ORDER — MIDAZOLAM HCL 2 MG/2ML IJ SOLN
INTRAMUSCULAR | Status: AC | PRN
  Administered 2020-08-20: 1 mg via INTRAVENOUS

## 2020-08-20 MED ORDER — SODIUM CHLORIDE 0.9 % IV SOLN: INTRAVENOUS | Status: DC

## 2020-08-20 NOTE — Discharge Instructions (Signed)
[facility] Test Reference (13th ed., pp. [phone]). [place], [place]: Elsevier."> [facility] (7th ed., pp. [phone]). [place], [place]: [employer].">  Open Kidney Biopsy, Care After This sheet gives you information about how to care for yourself after your procedure. Your health care provider may also give you more specific instructions. If you have problems or questions, contact your health care provider. What can I expect after the procedure? After the procedure, it is common to have:  Pain or soreness near the incision.  Bright pink or cloudy urine for 24 hours after the procedure. This is normal. Follow these instructions at home: Incision care  Follow instructions from your health care provider about how to take care of your incision. Make sure you: ? Wash your hands with soap and water for at least 20 seconds before you change your bandage (dressing). If soap and water are not available, use hand sanitizer. ? Change your dressing as told by your health care provider. ? Leave stitches (sutures), skin glue, or adhesive strips in place. These skin closures may need to stay in place for 2 weeks or longer. If adhesive strip edges start to loosen and curl up, you may trim the loose edges. Do not remove adhesive strips completely unless your health care provider tells you to do that.  Check your incision area every day for signs of infection. Check for: ? More redness, swelling, or pain. ? Fluid or blood. ? Warmth. ? Pus or a bad smell.   Activity  Return to your normal activities as told by your health care provider. Ask your health care provider what activities are safe for you.  If you were given a sedative during the procedure, it can affect you for several hours. Do not drive or operate machinery until your health care provider says that it is safe.  Do not lift anything that is heavier than 10 lb (4.5 kg), or the limit that you are told,  until your health care provider tells you that it is safe.  Avoid activities that may put pressure on your abdomen, such as forceful coughing or straining.  Avoid activities that take a lot of effort until your health care provider approves. Most people will have to wait 2 weeks before returning to activities such as exercise or sex. General instructions  Take over-the-counter and prescription medicines only as told by your health care provider.  Follow instructions from your health care provider about eating or drinking restrictions.  Keep all follow-up visits as told by your health care provider. This is important. Contact a health care provider if:  You have more redness, swelling, or pain around your incision.  You have fluid or blood coming from your incision.  Your incision feels warm to the touch.  You have pus or a bad smell coming from your incision.  You have blood in your urine more than 24 hours after your procedure. Get help right away if:  Your urine is dark red or brown.  You have a fever.  You are unable to urinate.  You feel burning when you urinate.  You feel like you may faint.  You have severe pain in your abdomen or side. Summary  After the procedure, it is common to have pain or soreness near the incision.  Bright pink or cloudy urine for 24 hours after the procedure is also normal.  Contact your health care provider if you have more pain, signs of infection, or blood in your urine  more than 24 hours after your procedure. This information is not intended to replace advice given to you by your health care provider. Make sure you discuss any questions you have with your health care provider. Document Revised: 07/05/2019 Document Reviewed: 07/05/2019 Elsevier Patient Education  2021 [name]. Moderate Conscious Sedation, Adult, Care After This sheet gives you information about how to care for yourself after your procedure. Your health care  provider may also give you more specific instructions. If you have problems or questions, contact your health care provider. What can I expect after the procedure? After the procedure, it is common to have:  Sleepiness for several hours.  Impaired judgment for several hours.  Difficulty with balance.  Vomiting if you eat too soon. Follow these instructions at home: For the time period you were told by your health care provider:  Rest.  Do not participate in activities where you could fall or become injured.  Do not drive or use machinery.  Do not drink alcohol.  Do not take sleeping pills or medicines that cause drowsiness.  Do not make important decisions or sign legal documents.  Do not take care of children on your own.      Eating and drinking  Follow the diet recommended by your health care provider.  Drink enough fluid to keep your urine pale yellow.  If you vomit: ? Drink water, juice, or soup when you can drink without vomiting. ? Make sure you have little or no nausea before eating solid foods.   General instructions  Take over-the-counter and prescription medicines only as told by your health care provider.  Have a responsible adult stay with you for the time you are told. It is important to have someone help care for you until you are awake and alert.  Do not smoke.  Keep all follow-up visits as told by your health care provider. This is important. Contact a health care provider if:  You are still sleepy or having trouble with balance after 24 hours.  You feel light-headed.  You keep feeling nauseous or you keep vomiting.  You develop a rash.  You have a fever.  You have redness or swelling around the IV site. Get help right away if:  You have trouble breathing.  You have new-onset confusion at home. Summary  After the procedure, it is common to feel sleepy, have impaired judgment, or feel nauseous if you eat too soon.  Rest after you  get home. Know the things you should not do after the procedure.  Follow the diet recommended by your health care provider and drink enough fluid to keep your urine pale yellow.  Get help right away if you have trouble breathing or new-onset confusion at home. This information is not intended to replace advice given to you by your health care provider. Make sure you discuss any questions you have with your health care provider. Document Revised: 08/09/2019 Document Reviewed: 03/07/2019 Elsevier Patient Education  2021 [employer].

## 2020-08-20 NOTE — Consult Note (Signed)
Chief Complaint: Patient was seen in consultation today for image guided right renal mass biopsy  Referring Physician(s): Gay,Matthew R  Supervising Physician: Jacqulynn Cadet  Patient Status: [facility] - Out-pt  History of Present Illness: Richard Li is a 60 y.o. male with history of hypertension, nephrolithiasis, anxiety, and right lung carcinoma with prior chemoradiation.  Recent imaging secondary to hematuria has revealed a new 3.8 cm lesion in the midpole of the right kidney.  He presents today for image guided biopsy of the right renal mass for further evaluation.  Past Medical History:  Diagnosis Date  . Allergic rhinitis, cause unspecified   . Anxiety state, unspecified   . Asthma    as a child  . Chronic airway obstruction, not elsewhere classified   . Esophageal reflux   . History of kidney stones   . Hypertension   . Lumbago   . lung ca dx'd 12/2019  . Other and unspecified hyperlipidemia   . Other chest pain     Past Surgical History:  Procedure Laterality Date  . APPENDECTOMY    . BRONCHIAL BIOPSY  01/07/2020   Procedure: BRONCHIAL BIOPSIES;  Surgeon: Collene Gobble, MD;  Location: [facility] ENDOSCOPY;  Service: Pulmonary;;  . BRONCHIAL BIOPSY  04/05/2020   Procedure: BRONCHIAL BIOPSIES;  Surgeon: Rigoberto Noel, MD;  Location: [place];  Service: Cardiopulmonary;;  . BRONCHIAL BRUSHINGS  01/07/2020   Procedure: BRONCHIAL BRUSHINGS;  Surgeon: Collene Gobble, MD;  Location: [facility] ENDOSCOPY;  Service: Pulmonary;;  . BRONCHIAL NEEDLE ASPIRATION BIOPSY  01/07/2020   Procedure: BRONCHIAL NEEDLE ASPIRATION BIOPSIES;  Surgeon: Collene Gobble, MD;  Location: [facility] ENDOSCOPY;  Service: Pulmonary;;  . BRONCHIAL WASHINGS  01/07/2020   Procedure: BRONCHIAL WASHINGS;  Surgeon: Collene Gobble, MD;  Location: [facility] ENDOSCOPY;  Service: Pulmonary;;  . BRONCHIAL WASHINGS  04/05/2020   Procedure: BRONCHIAL WASHINGS;  Surgeon: Rigoberto Noel, MD;  Location: [place];  Service:  Cardiopulmonary;;  . COLONOSCOPY  15 years ago  . HEMOSTASIS CONTROL  01/07/2020   Procedure: HEMOSTASIS CONTROL;  Surgeon: Collene Gobble, MD;  Location: [facility] ENDOSCOPY;  Service: Pulmonary;;  cold saline  . NASAL TURBINATE REDUCTION  2002   Dr.Crossley  . VASECTOMY    . VIDEO BRONCHOSCOPY Right 04/05/2020   Procedure: VIDEO BRONCHOSCOPY WITH FLUORO;  Surgeon: Rigoberto Noel, MD;  Location: [place];  Service: Cardiopulmonary;  Laterality: Right;  [name] VIDEO BRONCHOSCOPY WITH ENDOBRONCHIAL NAVIGATION N/A 01/07/2020   Procedure: VIDEO BRONCHOSCOPY WITH ENDOBRONCHIAL NAVIGATION;  Surgeon: Collene Gobble, MD;  Location: [place] ENDOSCOPY;  Service: Pulmonary;  Laterality: N/A;  . VIDEO BRONCHOSCOPY WITH ENDOBRONCHIAL ULTRASOUND N/A 01/07/2020   Procedure: VIDEO BRONCHOSCOPY WITH ENDOBRONCHIAL ULTRASOUND;  Surgeon: Collene Gobble, MD;  Location: [place] ENDOSCOPY;  Service: Pulmonary;  Laterality: N/A;    Allergies: Amoxicillin  Medications: Prior to Admission medications   Medication Sig Start Date End Date Taking? Authorizing Provider  acetaminophen (TYLENOL) 500 MG tablet Take 1,000 mg by mouth 2 (two) times daily.   Yes [provider]  dextromethorphan-guaiFENesin (MUCINEX DM) 30-600 MG 12hr tablet Take 1 tablet by mouth 2 (two) times daily.   Yes [provider]  doxycycline (VIBRA-TABS) 100 MG tablet Take 1 tablet (100 mg total) by mouth 2 (two) times daily for 28 days. 07/27/20 08/24/20 Yes Hunsucker, Bonna Gains, MD  feeding supplement (ENSURE ENLIVE / ENSURE PLUS) LIQD Take 237 mLs by mouth 3 (three) times daily between meals. 05/11/20  Yes Raiford Noble [place], DO  fenofibrate 160 MG tablet Take 1 tablet by mouth once daily 05/18/20  Yes [name], DO  ferrous sulfate 325 (65 FE) MG tablet Take 1 tablet (325 mg total) by mouth daily with breakfast. 05/12/20  Yes Sheikh, Omair Latif, DO  folic acid (FOLVITE) 1 MG tablet Take 1 tablet (1 mg total) by mouth daily. 03/08/20  Yes  Patrecia Pour, MD  Ipratropium-Albuterol (COMBIVENT RESPIMAT) 20-100 MCG/ACT AERS respimat Inhale 1-2 puffs into the lungs every 6 (six) hours as needed for wheezing. 07/27/20  Yes Hunsucker, Bonna Gains, MD  LORazepam (ATIVAN) 1 MG tablet Take 1 tablet (1 mg total) by mouth See admin instructions. Take one tablet (1 mg) by mouth daily at bedtime, may also take one tablet (1 mg) twice during the day as needed for anxiety 08/12/20  Yes [name], DO  meclizine (ANTIVERT) 25 MG tablet Take 1 tablet (25 mg total) by mouth every 6 (six) hours as needed for dizziness. 06/22/20  Yes [name], DO  Multiple Vitamin (MULTIVITAMIN WITH MINERALS) TABS tablet Take 1 tablet by mouth daily. 03/18/20  Yes Mercy Riding, MD  predniSONE (DELTASONE) 10 MG tablet Take 1 tablet (10 mg total) by mouth daily with breakfast. 08/04/20  Yes Hunsucker, Bonna Gains, MD  prochlorperazine (COMPAZINE) 10 MG tablet TAKE 1 TABLET BY MOUTH EVERY 6 HOURS AS NEEDED FOR NAUSEA FOR VOMITING Patient taking differently: Take 10 mg by mouth every 6 (six) hours as needed for nausea or vomiting. 03/26/20  Yes Curt Bears, MD  potassium chloride SA (KLOR-CON) 20 MEQ tablet Take 2 tablets (40 mEq total) by mouth daily. 04/22/20 05/08/20  Curt Bears, MD  sucralfate (CARAFATE) 1 g tablet Take 1 tablet (1 g total) by mouth 4 (four) times daily -  with meals and at bedtime. Patient not taking: Reported on 03/05/2020 03/02/20 03/17/20  Curt Bears, MD     Family History  Problem Relation Age of Onset  . Breast cancer Mother   . Cancer Father   . Heart attack Brother   . Emphysema Maternal Uncle   . COPD Maternal Uncle   . Heart disease Maternal Uncle   . Colon cancer Neg Hx     Social History   Socioeconomic History  . Marital status: Married    Spouse name: Designer, industrial/product  . Number of children: Not on file  . Years of education: Not on file  . Highest education level: Not on file  Occupational History  . Occupation:  truck Education administrator: [employer]  Tobacco Use  . Smoking status: Former Smoker    Packs/day: 2.00    Years: 28.00    Pack years: 56.00    Types: Cigarettes    Quit date: 04/25/2005    Years since quitting: 15.3  . Smokeless tobacco: Never Used  Vaping Use  . Vaping Use: Never used  Substance and Sexual Activity  . Alcohol use: Yes    Alcohol/week: 12.0 standard drinks    Types: 12 Cans of beer per week    Comment: social use  . Drug use: No  . Sexual activity: Not on file  Other Topics Concern  . Not on file  Social History Narrative  . Not on file   Social Determinants of Health   Financial Resource Strain: Not on file  Food Insecurity: Not on file  Transportation Needs: Not on file  Physical Activity: Not on file  Stress: Not on file  Social Connections: Not on file  Review of Systems denies fever, headache, chest pain, dyspnea, cough, abdominal/back pain, nausea, vomiting.  Vital Signs: BP 120/69   Pulse 75   Resp 20   Ht 5\' 10"  (1.778 m)   Wt 165 lb (74.8 kg)   SpO2 98%   BMI 23.68 kg/m   Physical Exam awake, alert.  Chest with distant but clear breath sounds bilaterally.  Heart with regular rate and rhythm.  Abdomen soft, positive bowel sounds, nontender.  No lower extremity edema.  Imaging: No results found.  Labs:  CBC: Recent Labs    05/11/20 0103 05/28/20 0000 06/24/20 1517 07/20/20 1520  WBC 10.2 13.1* 11.9* 8.2  HGB 7.8* 8.7* 10.4* 10.6*  HCT 25.0* 27.5* 33.5* 34.8*  PLT 461* 676* 423* 278    COAGS: Recent Labs    03/05/20 1708 03/06/20 0544 03/13/20 1043 04/04/20 1254 05/08/20 1249  INR 1.2 1.2 1.1 1.2 1.2  APTT 44*  --  29 38* 36    BMP: Recent Labs    11/06/19 1132 01/07/20 1018 01/17/20 0837 02/10/20 0803 05/10/20 0124 05/11/20 0103 06/24/20 1517 07/20/20 1520  NA 136 136 137   < > 138 140 137 139  K 4.4 3.5 4.0   < > 3.1* 3.4* 4.3 4.6  CL 100 101 101   < > 103 105 99 103  CO2 29 24 29    < > 27 26 28  27   GLUCOSE 91 87 87   < > 136* 83 100* 106*  BUN 15 12 17    < > 14 11 15 12   CALCIUM 9.3 8.9 9.1   < > 8.5* 8.3* 9.7 9.3  CREATININE 1.26 1.06 0.96   < > 0.64 0.55* 0.67* 0.84  GFRNONAA 62 >60 >60   < > >60 >60 105 >60  GFRAA 72 >60 >60  --   --   --  122  --    < > = values in this interval not displayed.    LIVER FUNCTION TESTS: Recent Labs    05/08/20 1135 05/10/20 0124 05/11/20 0103 06/24/20 1517 07/20/20 1520  BILITOT 0.3 0.5 0.7 0.4 0.3  AST 17 12* 11* 12 13*  ALT 15 10 11 10 10   ALKPHOS 95 74 68  --  72  PROT 6.8 5.5* 5.5* 7.3 7.6  ALBUMIN 2.3* 1.9* 1.9*  --  3.6    TUMOR MARKERS: No results for input(s): AFPTM, CEA, CA199, CHROMGRNA in the last 8760 hours.  Assessment and Plan: 60 y.o. male, ex smoker,  with history of hypertension, nephrolithiasis, anxiety, and right lung carcinoma with prior chemoradiation.  Recent imaging secondary to hematuria has revealed a new 3.8 cm lesion in the midpole of the right kidney.  He presents today for image guided biopsy of the right renal mass for further evaluation.Risks and benefits of procedure was discussed with the patient  including, but not limited to bleeding, infection, damage to adjacent structures or low yield requiring additional tests.  All of the questions were answered and there is agreement to proceed.  Consent signed and in chart.  LABS PENDING   Thank you for this interesting consult.  I greatly enjoyed meeting Richard Li and look forward to participating in their care.  A copy of this report was sent to the requesting provider on this date.  Electronically Signed: D. Rowe Robert, PA-C 08/20/2020, 10:33 AM   I spent a total of  25 minutes   in face to face in clinical consultation, greater than  50% of which was counseling/coordinating care for image guided right renal mass biopsy

## 2020-08-20 NOTE — Procedures (Signed)
Interventional Radiology Procedure Note  Procedure: [place] and CT guided bx of right renal mass.  Complications: None  Estimated Blood Loss: <25 mL  Recommendations: - Path sent - Bedrest x 2 hrs - DC home   Signed,  Criselda Peaches, MD

## 2020-08-23 ENCOUNTER — Other Ambulatory Visit: Payer: Self-pay | Admitting: Pulmonary Disease

## 2020-08-24 LAB — SURGICAL PATHOLOGY

## 2020-08-26 ENCOUNTER — Telehealth: Payer: Self-pay | Admitting: *Deleted

## 2020-08-26 DIAGNOSIS — C3491 Malignant neoplasm of unspecified part of right bronchus or lung: Secondary | ICD-10-CM

## 2020-08-26 NOTE — Telephone Encounter (Signed)
I received a message from Dr. Julien Nordmann. He would like to see patient this Friday at 9:30. I called him to update him of appt and labs at 9:00.  Patient verbalized understanding of appt.

## 2020-08-27 NOTE — Telephone Encounter (Signed)
Received the following message from patient:   "We are just checking to see about the Doxycycline. [facility] said it was declined by Dr. Silas Flood. Richard Li is still having coughing issues that's producing mucus. All was going well until he came off the prednisone but since going back on it, he's had some improvement, so we are wondering if coming off the antibiotic will be taking a step backwards. Thank you. "  I viewed his chart. It looks like the refill was denied on 08/24/20 by Amy. MH, please advise if you want him to remain on doxy. Thanks!

## 2020-08-28 ENCOUNTER — Other Ambulatory Visit: Payer: Self-pay

## 2020-08-28 ENCOUNTER — Inpatient Hospital Stay: Payer: BC Managed Care – PPO | Attending: Internal Medicine | Admitting: Internal Medicine

## 2020-08-28 ENCOUNTER — Inpatient Hospital Stay: Payer: BC Managed Care – PPO

## 2020-08-28 VITALS — BP 123/70 | HR 96 | Temp 97.5°F | Resp 20 | Ht 70.0 in | Wt 182.8 lb

## 2020-08-28 DIAGNOSIS — Z9221 Personal history of antineoplastic chemotherapy: Secondary | ICD-10-CM | POA: Insufficient documentation

## 2020-08-28 DIAGNOSIS — C3481 Malignant neoplasm of overlapping sites of right bronchus and lung: Secondary | ICD-10-CM | POA: Diagnosis not present

## 2020-08-28 DIAGNOSIS — C7951 Secondary malignant neoplasm of bone: Secondary | ICD-10-CM | POA: Diagnosis not present

## 2020-08-28 DIAGNOSIS — N400 Enlarged prostate without lower urinary tract symptoms: Secondary | ICD-10-CM | POA: Diagnosis not present

## 2020-08-28 DIAGNOSIS — C3491 Malignant neoplasm of unspecified part of right bronchus or lung: Secondary | ICD-10-CM | POA: Diagnosis not present

## 2020-08-28 DIAGNOSIS — C3490 Malignant neoplasm of unspecified part of unspecified bronchus or lung: Secondary | ICD-10-CM

## 2020-08-28 DIAGNOSIS — C787 Secondary malignant neoplasm of liver and intrahepatic bile duct: Secondary | ICD-10-CM | POA: Insufficient documentation

## 2020-08-28 DIAGNOSIS — Z7952 Long term (current) use of systemic steroids: Secondary | ICD-10-CM | POA: Insufficient documentation

## 2020-08-28 DIAGNOSIS — Z923 Personal history of irradiation: Secondary | ICD-10-CM | POA: Insufficient documentation

## 2020-08-28 DIAGNOSIS — Z79899 Other long term (current) drug therapy: Secondary | ICD-10-CM | POA: Insufficient documentation

## 2020-08-28 DIAGNOSIS — C3482 Malignant neoplasm of overlapping sites of left bronchus and lung: Secondary | ICD-10-CM | POA: Diagnosis present

## 2020-08-28 DIAGNOSIS — C7901 Secondary malignant neoplasm of right kidney and renal pelvis: Secondary | ICD-10-CM | POA: Insufficient documentation

## 2020-08-28 LAB — CMP (CANCER CENTER ONLY)
ALT: 6 U/L (ref 0–44)
AST: 14 U/L — ABNORMAL LOW (ref 15–41)
Albumin: 3.7 g/dL (ref 3.5–5.0)
Alkaline Phosphatase: 63 U/L (ref 38–126)
Anion gap: 9 (ref 5–15)
BUN: 13 mg/dL (ref 6–20)
CO2: 28 mmol/L (ref 22–32)
Calcium: 9.5 mg/dL (ref 8.9–10.3)
Chloride: 104 mmol/L (ref 98–111)
Creatinine: 0.93 mg/dL (ref 0.61–1.24)
GFR, Estimated: >60 mL/min (ref >60)
Glucose, Bld: 77 mg/dL (ref 70–99)
Potassium: 3.8 mmol/L (ref 3.5–5.1)
Sodium: 141 mmol/L (ref 135–145)
Total Bilirubin: 0.3 mg/dL (ref 0.3–1.2)
Total Protein: 7.1 g/dL (ref 6.5–8.1)

## 2020-08-28 LAB — CBC WITH DIFFERENTIAL (CANCER CENTER ONLY)
Abs Immature Granulocytes: 0.03 10*3/uL (ref 0.00–0.07)
Basophils Absolute: 0 10*3/uL (ref 0.0–0.1)
Basophils Relative: 1 %
Eosinophils Absolute: 0.2 10*3/uL (ref 0.0–0.5)
Eosinophils Relative: 3 %
HCT: 35.9 % — ABNORMAL LOW (ref 39.0–52.0)
Hemoglobin: 11.7 g/dL — ABNORMAL LOW (ref 13.0–17.0)
Immature Granulocytes: 1 %
Lymphocytes Relative: 19 %
Lymphs Abs: 1.3 10*3/uL (ref 0.7–4.0)
MCH: 27.3 pg (ref 26.0–34.0)
MCHC: 32.6 g/dL (ref 30.0–36.0)
MCV: 83.9 fL (ref 80.0–100.0)
Monocytes Absolute: 0.9 10*3/uL (ref 0.1–1.0)
Monocytes Relative: 14 %
Neutro Abs: 4.1 10*3/uL (ref 1.7–7.7)
Neutrophils Relative %: 62 %
Platelet Count: 229 10*3/uL (ref 150–400)
RBC: 4.28 MIL/uL (ref 4.22–5.81)
RDW: 17.3 % — ABNORMAL HIGH (ref 11.5–15.5)
WBC Count: 6.5 10*3/uL (ref 4.0–10.5)
nRBC: 0 % (ref 0.0–0.2)

## 2020-08-28 MED ORDER — DOXYCYCLINE HYCLATE 100 MG PO TABS: 100.0000 mg | ORAL_TABLET | Freq: Two times a day (BID) | ORAL | 1 refills | Status: DC

## 2020-08-28 NOTE — Progress Notes (Signed)
[facility] [phone]   [phone]  OFFICE PROGRESS NOTE  [name], DO [place]  [address] [place] [address]  DIAGNOSIS: Metastatic non-small cell lung cancer initially diagnosed as stage IIB (T3, N0, M0) non-small cell lung cancer, adenocarcinoma presented with large central perihilar mass with suspicious groundglass opacity in the right middle lobe and left upper lobe diagnosed in September 2021.  The patient had evidence of metastatic disease to the right kidney in March 2022.  PRIOR THERAPY: Concurrent chemoradiation with weekly carboplatin for AUC of 2 and paclitaxel 45 mg/M2.  First dose February 10, 2020.  Status post 5  cycles.   CURRENT THERAPY: Observation.  INTERVAL HISTORY: Richard Li 60 y.o. male returns to the clinic today for follow-up visit accompanied by his wife.  The patient is feeling fine today with no concerning complaints except for mild persistent shortness of breath and he has been on long-term treatment with doxycycline.  He denied having any chest pain but has mild cough with no hemoptysis.  He denied having any recent weight loss or night sweats.  He has no nausea, vomiting, diarrhea or constipation.  He denied having any headache or visual changes.  The patient was seen by urology for the suspicious right renal mass and CT biopsy of the lesion was consistent with metastatic adenocarcinoma of lung primary.  He was referred back to me today for evaluation and recommendation regarding his condition.    MEDICAL HISTORY: Past Medical History:  Diagnosis Date  . Allergic rhinitis, cause unspecified   . Anxiety state, unspecified   . Asthma    as a child  . Chronic airway obstruction, not elsewhere classified   . Esophageal reflux   . History of kidney stones   . Hypertension   . Lumbago   . lung ca dx'd 12/2019  . Other and unspecified hyperlipidemia   . Other chest pain     ALLERGIES:   is allergic to amoxicillin.  MEDICATIONS:  Current Outpatient Medications  Medication Sig Dispense Refill  . acetaminophen (TYLENOL) 500 MG tablet Take 1,000 mg by mouth 2 (two) times daily.    [name] dextromethorphan-guaiFENesin (MUCINEX DM) 30-600 MG 12hr tablet Take 1 tablet by mouth 2 (two) times daily.    . feeding supplement (ENSURE ENLIVE / ENSURE PLUS) LIQD Take 237 mLs by mouth 3 (three) times daily between meals. 237 mL 12  . fenofibrate 160 MG tablet Take 1 tablet by mouth once daily 90 tablet 3  . ferrous sulfate 325 (65 FE) MG tablet Take 1 tablet (325 mg total) by mouth daily with breakfast. 30 tablet 0  . folic acid (FOLVITE) 1 MG tablet Take 1 tablet (1 mg total) by mouth daily. 30 tablet 2  . Ipratropium-Albuterol (COMBIVENT RESPIMAT) 20-100 MCG/ACT AERS respimat Inhale 1-2 puffs into the lungs every 6 (six) hours as needed for wheezing. 4 g 3  . LORazepam (ATIVAN) 1 MG tablet Take 1 tablet (1 mg total) by mouth See admin instructions. Take one tablet (1 mg) by mouth daily at bedtime, may also take one tablet (1 mg) twice during the day as needed for anxiety 90 tablet 2  . meclizine (ANTIVERT) 25 MG tablet Take 1 tablet (25 mg total) by mouth every 6 (six) hours as needed for dizziness. 60 tablet 0  . Multiple Vitamin (MULTIVITAMIN WITH MINERALS) TABS tablet Take 1 tablet by mouth daily.    . predniSONE (DELTASONE) 10  MG tablet Take 1 tablet (10 mg total) by mouth daily with breakfast. 30 tablet 1  . prochlorperazine (COMPAZINE) 10 MG tablet TAKE 1 TABLET BY MOUTH EVERY 6 HOURS AS NEEDED FOR NAUSEA FOR VOMITING (Patient taking differently: Take 10 mg by mouth every 6 (six) hours as needed for nausea or vomiting.) 30 tablet 0   No current facility-administered medications for this visit.   Facility-Administered Medications Ordered in Other Visits  Medication Dose Route Frequency Provider Last Rate Last Admin  . 0.9 %  sodium chloride infusion   Intravenous Continuous Curt Bears, MD   Stopped at 03/05/20 1410    SURGICAL HISTORY:  Past Surgical History:  Procedure Laterality Date  . APPENDECTOMY    . BRONCHIAL BIOPSY  01/07/2020   Procedure: BRONCHIAL BIOPSIES;  Surgeon: Collene Gobble, MD;  Location: [facility] ENDOSCOPY;  Service: Pulmonary;;  . BRONCHIAL BIOPSY  04/05/2020   Procedure: BRONCHIAL BIOPSIES;  Surgeon: Rigoberto Noel, MD;  Location: [place];  Service: Cardiopulmonary;;  . BRONCHIAL BRUSHINGS  01/07/2020   Procedure: BRONCHIAL BRUSHINGS;  Surgeon: Collene Gobble, MD;  Location: [facility] ENDOSCOPY;  Service: Pulmonary;;  . BRONCHIAL NEEDLE ASPIRATION BIOPSY  01/07/2020   Procedure: BRONCHIAL NEEDLE ASPIRATION BIOPSIES;  Surgeon: Collene Gobble, MD;  Location: [facility] ENDOSCOPY;  Service: Pulmonary;;  . BRONCHIAL WASHINGS  01/07/2020   Procedure: BRONCHIAL WASHINGS;  Surgeon: Collene Gobble, MD;  Location: [facility] ENDOSCOPY;  Service: Pulmonary;;  . BRONCHIAL WASHINGS  04/05/2020   Procedure: BRONCHIAL WASHINGS;  Surgeon: Rigoberto Noel, MD;  Location: [place];  Service: Cardiopulmonary;;  . COLONOSCOPY  15 years ago  . HEMOSTASIS CONTROL  01/07/2020   Procedure: HEMOSTASIS CONTROL;  Surgeon: Collene Gobble, MD;  Location: [facility] ENDOSCOPY;  Service: Pulmonary;;  cold saline  . NASAL TURBINATE REDUCTION  2002   Dr.Crossley  . VASECTOMY    . VIDEO BRONCHOSCOPY Right 04/05/2020   Procedure: VIDEO BRONCHOSCOPY WITH FLUORO;  Surgeon: Rigoberto Noel, MD;  Location: [place];  Service: Cardiopulmonary;  Laterality: Right;  [name] VIDEO BRONCHOSCOPY WITH ENDOBRONCHIAL NAVIGATION N/A 01/07/2020   Procedure: VIDEO BRONCHOSCOPY WITH ENDOBRONCHIAL NAVIGATION;  Surgeon: Collene Gobble, MD;  Location: [place] ENDOSCOPY;  Service: Pulmonary;  Laterality: N/A;  . VIDEO BRONCHOSCOPY WITH ENDOBRONCHIAL ULTRASOUND N/A 01/07/2020   Procedure: VIDEO BRONCHOSCOPY WITH ENDOBRONCHIAL ULTRASOUND;  Surgeon: Collene Gobble, MD;  Location: [place] ENDOSCOPY;  Service: Pulmonary;  Laterality:  N/A;    REVIEW OF SYSTEMS:  Constitutional: positive for fatigue Eyes: negative Ears, nose, mouth, throat, and face: negative Respiratory: positive for dyspnea on exertion Cardiovascular: negative Gastrointestinal: negative Genitourinary:negative Integument/breast: negative Hematologic/lymphatic: negative Musculoskeletal:negative Neurological: negative Behavioral/Psych: negative Endocrine: negative Allergic/Immunologic: negative   PHYSICAL EXAMINATION: General appearance: alert, cooperative, fatigued and no distress Head: Normocephalic, without obvious abnormality, atraumatic Neck: no adenopathy, no JVD, supple, symmetrical, trachea midline and thyroid not enlarged, symmetric, no tenderness/mass/nodules Lymph nodes: Cervical, supraclavicular, and axillary nodes normal. Resp: clear to auscultation bilaterally Back: symmetric, no curvature. ROM normal. No CVA tenderness. Cardio: regular rate and rhythm, S1, S2 normal, no murmur, click, rub or gallop GI: soft, non-tender; bowel sounds normal; no masses,  no organomegaly Extremities: extremities normal, atraumatic, no cyanosis or edema Neurologic: Alert and oriented X 3, normal strength and tone. Normal symmetric reflexes. Normal coordination and gait  ECOG PERFORMANCE STATUS: 1 - Symptomatic but completely ambulatory  Blood pressure 123/70, pulse 96, temperature (!) 97.5 F (36.4 C), temperature source Tympanic, resp. rate 20, height 5' 10" (1.778  m), weight 182 lb 12.8 oz (82.9 kg), SpO2 100 %.  LABORATORY DATA: Lab Results  Component Value Date   WBC 6.5 08/28/2020   HGB 11.7 (L) 08/28/2020   HCT 35.9 (L) 08/28/2020   MCV 83.9 08/28/2020   PLT 229 08/28/2020      Chemistry      Component Value Date/Time   NA 139 07/20/2020 1520   K 4.6 07/20/2020 1520   CL 103 07/20/2020 1520   CO2 27 07/20/2020 1520   BUN 12 07/20/2020 1520   CREATININE 0.84 07/20/2020 1520   CREATININE 0.67 (L) 06/24/2020 1517      Component  Value Date/Time   CALCIUM 9.3 07/20/2020 1520   ALKPHOS 72 07/20/2020 1520   AST 13 (L) 07/20/2020 1520   ALT 10 07/20/2020 1520   BILITOT 0.3 07/20/2020 1520       RADIOGRAPHIC STUDIES: CT RENAL BIOPSY  Result Date: 08/26/2020 INDICATION: 60 year old male with an irregular mass in the interpolar right kidney. Patient has a history of pulmonary adenocarcinoma. Findings are concerning for urothelial carcinoma versus metastatic lesion to the kidney. Patient presents for image guided core biopsy EXAM: CT BIOPSY CORE RENAL MEDICATIONS: None. ANESTHESIA/SEDATION: Moderate (conscious) sedation was employed during this procedure. A total of Versed 1 mg and Fentanyl 50 mcg was administered intravenously. Moderate Sedation Time: 15 minutes. The patient's level of consciousness and vital signs were monitored continuously by radiology nursing throughout the procedure under my direct supervision. FLUOROSCOPY TIME:  None COMPLICATIONS: None immediate. PROCEDURE: Informed written consent was obtained from the patient after a thorough discussion of the procedural risks, benefits and alternatives. All questions were addressed. Maximal Sterile Barrier Technique was utilized including caps, mask, sterile gowns, sterile gloves, sterile drape, hand hygiene and skin antiseptic. A timeout was performed prior to the initiation of the procedure. Initial CT imaging was performed. The mass is nearly occult by CT. Therefore, ultrasound was performed while the patient was on the CT gantry. Ultrasound demonstrates the irregular mass in the central aspect of the kidney. A suitable skin entry site was selected and marked. The skin was sterilely prepped and draped in the standard fashion using chlorhexidine skin prep. Local anesthesia was attained by infiltration with 1% lidocaine. A small dermatotomy was made. Under real-time ultrasound guidance, an 18 gauge trocar needle was advanced into the margin of the mass. Multiple 18 gauge  core biopsies were then obtained coaxially using the bio Pince automated biopsy device. Biopsy specimens were placed in formalin and delivered to pathology for further analysis. Repeat CT imaging was performed demonstrating a very small amount of perinephric hemorrhage. This is not unexpected. Patient remained clinically asymptomatic. IMPRESSION: Technically successful combined ultrasound and CT guided biopsy of right renal mass. Electronically Signed   By: Jacqulynn Cadet M.D.   On: 08/26/2020 12:21    ASSESSMENT AND PLAN: This is a very pleasant 60 years old white male with metastatic non-small cell lung cancer that was initially diagnosed as stage IIb (T3, N0, M0) non-small cell lung cancer, adenocarcinoma presented with perihilar right upper lobe lung mass with suspicious groundglass opacity in the right middle lobe.  He had evidence for disease metastasis in March 2022 to the right kidney that was biopsy-proven in April 2022. The patient is not a good surgical candidate for resection according to Dr. Roxan Hockey. The patient underwent a course of concurrent chemoradiation with weekly carboplatin for AUC of 2 and paclitaxel 45 mg/M2 status post 5 cycles.  He has been tolerating this  treatment well except for fatigue, mild odynophagia as well as recurrent pneumonia and septicemia.  The patient also has radiation-induced pneumonitis. He was admitted to the hospital several times with recurrent pneumonia.   He is feeling a little bit better this days but unfortunately the CT biopsy of the renal mass was consistent with metastatic adenocarcinoma of lung primary. I had a lengthy discussion with the patient today about his current condition and treatment options. I recommended for the patient to have repeat PET scan for further evaluation of his condition and to complete the staging work-up of his disease. I will also send the blood test to [facility] for molecular studies and if needed this will be  followed by tissue for molecular and PD-L1 expression. I will see the patient back for follow-up visit in 2 weeks for evaluation and discussion of his treatment options based on the staging work-up and molecular studies. The patient was advised to call immediately if he has any concerning symptoms in the interval. The patient voices understanding of current disease status and treatment options and is in agreement with the current care plan.  All questions were answered. The patient knows to call the clinic with any problems, questions or concerns. We can certainly see the patient much sooner if necessary.  Disclaimer: This note was dictated with voice recognition software. Similar sounding words can inadvertently be transcribed and may not be corrected upon review.

## 2020-08-31 ENCOUNTER — Encounter: Payer: Self-pay | Admitting: *Deleted

## 2020-08-31 NOTE — Progress Notes (Signed)
I followed up on Richard Li plan of care. Per Dr. Julien Nordmann patient needs PET scan and [facility] lab. PET scan is scheduled next week and [facility] obtained on 08/28/20.

## 2020-09-02 ENCOUNTER — Telehealth: Payer: Self-pay | Admitting: Internal Medicine

## 2020-09-02 NOTE — Telephone Encounter (Signed)
Scheduled per los. Called and left msg.

## 2020-09-07 ENCOUNTER — Encounter: Payer: Self-pay | Admitting: Internal Medicine

## 2020-09-07 ENCOUNTER — Encounter: Payer: Self-pay | Admitting: *Deleted

## 2020-09-07 NOTE — Progress Notes (Signed)
I received a message from [facility].  They needed a signature on his forms for molecular testing.  I had Dr. Julien Nordmann sign and faxed to them, with return fax received.

## 2020-09-08 LAB — GUARDANT 360

## 2020-09-10 ENCOUNTER — Other Ambulatory Visit: Payer: Self-pay

## 2020-09-10 ENCOUNTER — Encounter (HOSPITAL_COMMUNITY)
Admission: RE | Admit: 2020-09-10 | Discharge: 2020-09-10 | Disposition: A | Discharge status: Home / Self Care | Payer: BC Managed Care – PPO | Source: Ambulatory Visit | Attending: Internal Medicine | Admitting: Internal Medicine

## 2020-09-10 DIAGNOSIS — C3490 Malignant neoplasm of unspecified part of unspecified bronchus or lung: Secondary | ICD-10-CM

## 2020-09-10 LAB — GLUCOSE, CAPILLARY: Glucose-Capillary: 94 mg/dL (ref 70–99)

## 2020-09-10 IMAGING — CT NM PET TUM IMG RESTAG (PS) SKULL BASE T - THIGH
7 series · 25 of 25 positions shown · non-contrast
Comparison: Chest CT of [DATE]. Abdominal MRI of [DATE].
Most recent PET of [DATE].

CLINICAL DATA: Subsequent treatment strategy for non-small-cell
lung cancer diagnosed last [REDACTED]. Radiation therapy and
chemotherapy complete.

EXAM:
NUCLEAR MEDICINE PET SKULL BASE TO THIGH
TECHNIQUE: mCi F-18 FDG was injected intravenously. Full-ring PET imaging was
performed from the skull base to thigh after the radiotracer. CT
data was obtained and used for attenuation correction and anatomic
localization.
Fasting blood glucose:  mg/dl

[Series 3: pet sk_thigh ac · axial · 5.0mm · 4.07mm/px · z∈[-1528,-528]mm · 6 of 251 slices shown]
[im 1/251]
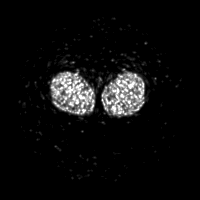
[im 51/251]
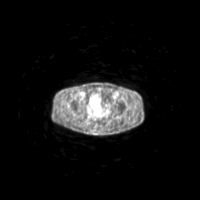
[im 101/251]
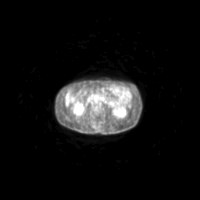
[im 151/251]
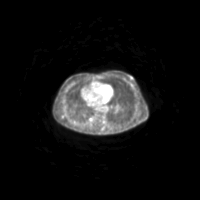
[im 201/251]
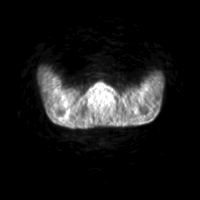
[im 251/251]
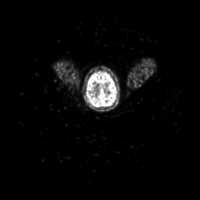

[Series 4: ct sk_thigh 5.0 bf37 · axial · 5.0mm · 0.98mm/px · z∈[-1528,-528]mm · 5 of 251 slices shown]
[im 1/251]
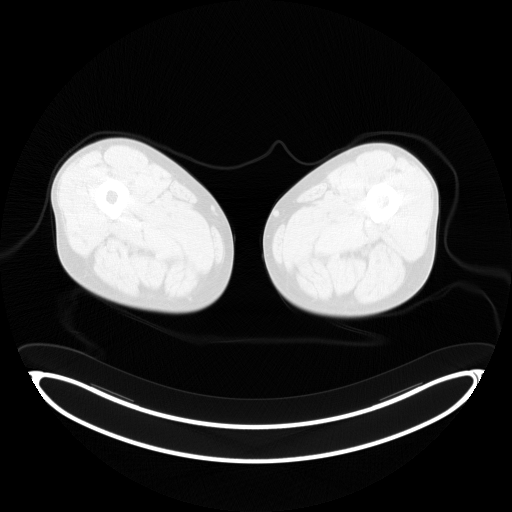
[im 63/251]
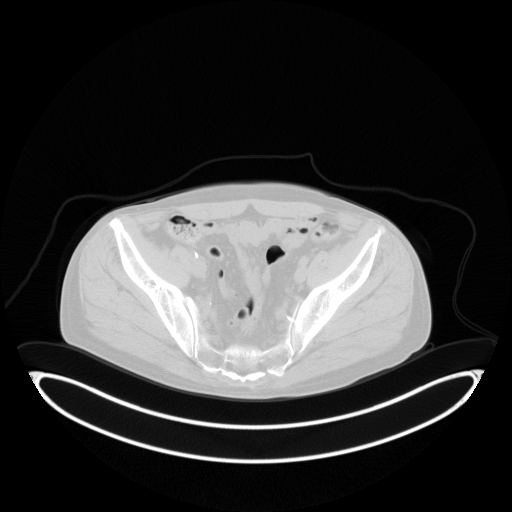
[im 126/251]
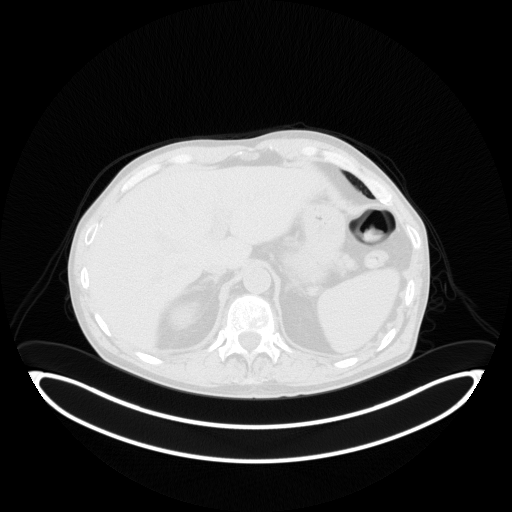
[im 188/251]
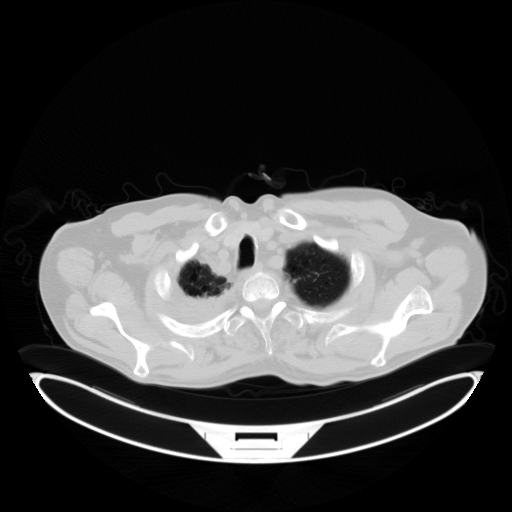
[im 251/251  brain]
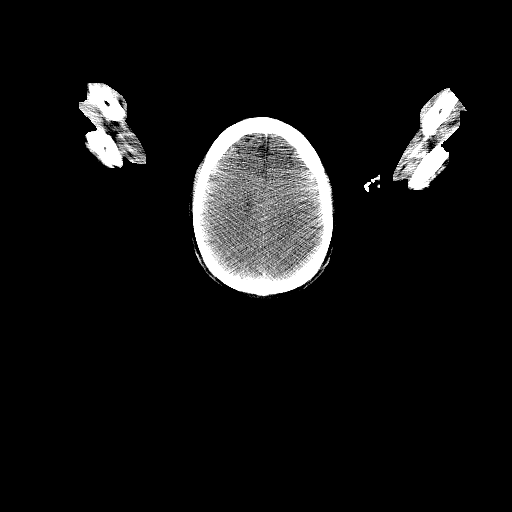

[Series 5: pet sk_thigh nac · axial · 5.0mm · 4.07mm/px · z∈[-1528,-528]mm · 5 of 251 slices shown]
[im 1/251]
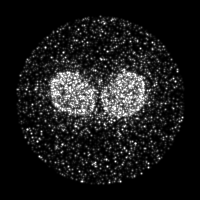
[im 63/251]
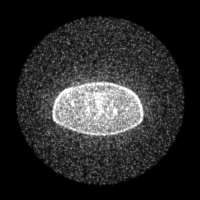
[im 126/251]
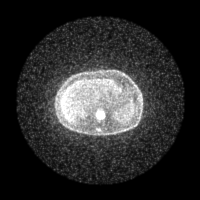
[im 188/251]
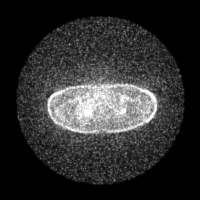
[im 251/251]
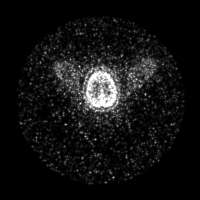

[Series 8: ct sk_thigh 5.0 br59 (id)_bone · axial · 5.0mm · 0.76mm/px · z∈[-1034,-738]mm · 2 of 75 slices shown]
[im 1/75]
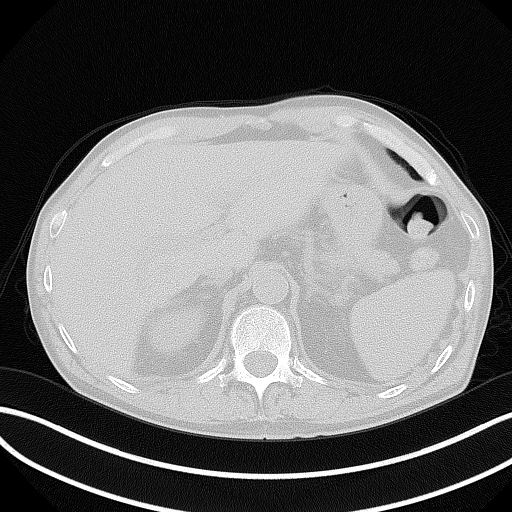
[im 75/75]
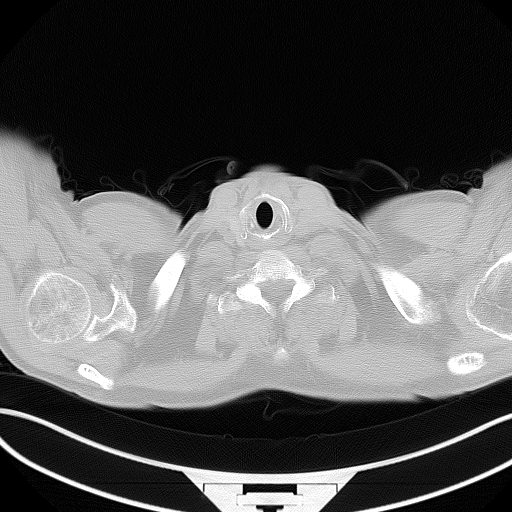

[Series 603: <mip collection> · coronal · 2.07mm/px · 1 of 32 slices shown]
[im 1/32]
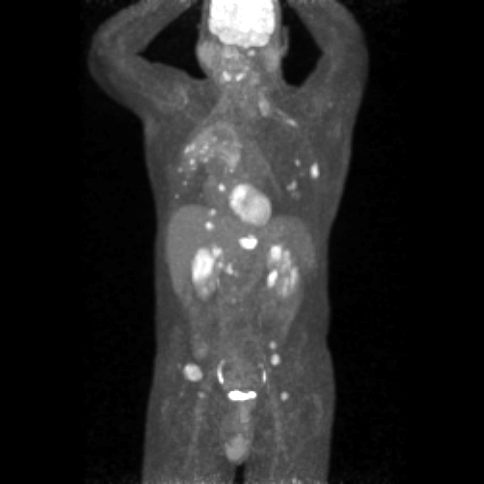

[Series 604: fused cor · 1 of 56 slices shown]
[im 1/56]
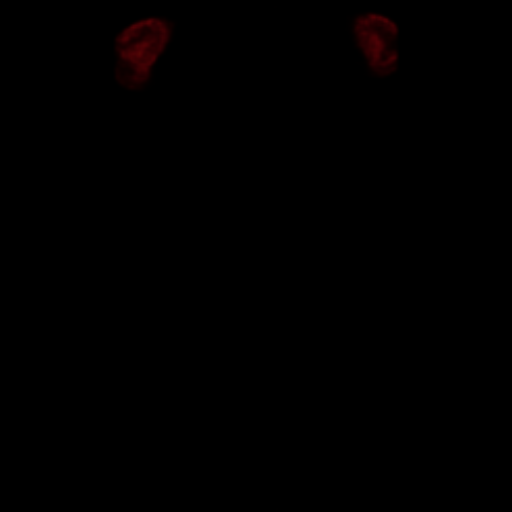

[Series 605: range-ct sk_thigh 5.0 bf37-tra-<alpha range> · 5 of 243 slices shown]
[im 1/243]
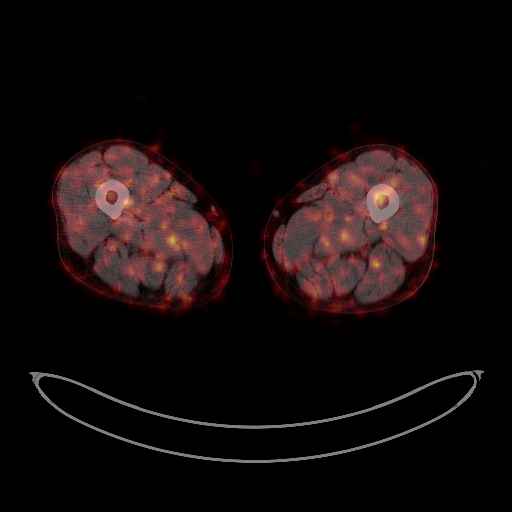
[im 61/243]
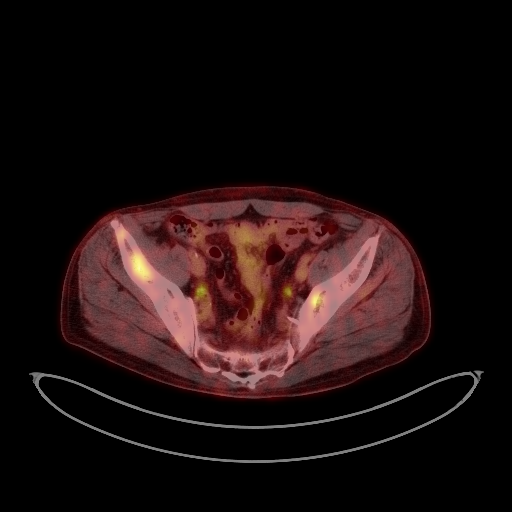
[im 122/243]
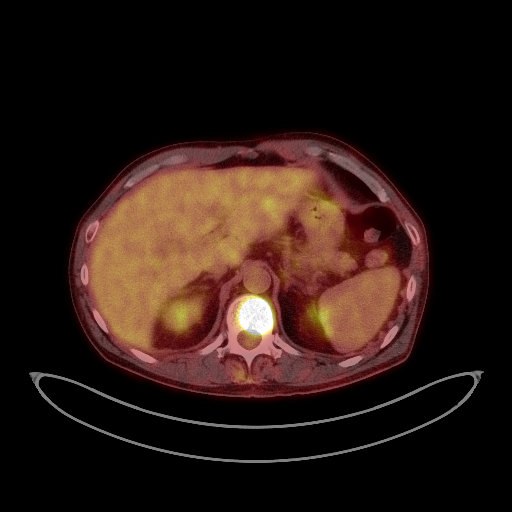
[im 182/243]
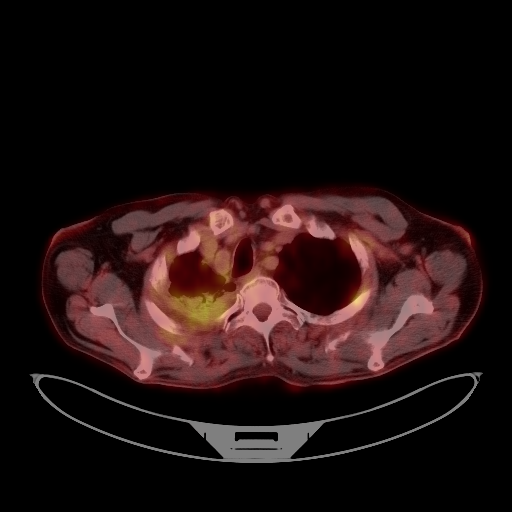
[im 243/243]
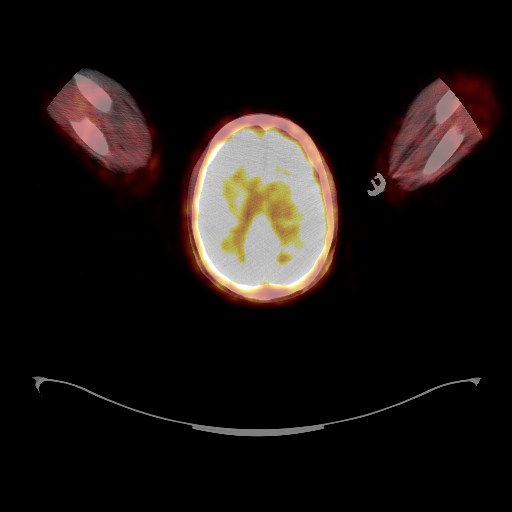

[25 of 25 positions shown; findings below may reference images not displayed]

FINDINGS: Mediastinal blood pool activity: SUV max

Liver activity: SUV max NA

NECK: No cervical nodal hypermetabolism. Lower cervical left-sided
scalene muscular hypermetabolism is likely due to motion after
radiopharmaceutical.

Incidental CT findings: No cervical adenopathy.

CHEST: Circumferential right apical pleural thickening with
low-level hypermetabolism is is suspicious but may be partially
treatment related. However, new areas of extensive right-sided
pleural irregularity and hypermetabolism more inferiorly are
consistent with metastatic disease. Example at 7 mm and a S.U.V. max
of 7.2 on 103/4. Hypermetabolism along the right cardiac border is
favored to be due to medial pleural disease including at a S.U.V.
max of 11.2 on image 100/4.

Right paratracheal node measures 6 mm and a S.U.V. max of 5.0 on
75/4.

Probable left-sided pleural disease is evidenced by hypermetabolism
which is favored to be pleural rather than arising from the adjacent
rib. Example at a S.U.V. max of 5.3 on 85/4.

Incidental CT findings: Aortic atherosclerosis. Moderate
centrilobular emphysema. Consolidation and cavitation in the right
apex at the site of treated tumor.

Index left lower lobe pulmonary nodule of 7 mm on 43/8 measured 6 mm
on [DATE].

ABDOMEN/PELVIS: New metastasis within the high left hepatic lobe at
1.4 cm and a S.U.V. max of 12.0 on 118/4 .

The previously detailed inter/upper pole right renal mass is
hypermetabolic, on the order of 3.3 cm and a S.U.V. max of 13.0 on
139/4.

Retrocaval node measures 7 mm and a S.U.V. max of 8.8 on 141/4.

Incidental CT findings: Abdominal aortic atherosclerosis. Mild
prostatomegaly.

SKELETON: Widespread osseous metastasis. Index T12 lesion measures a
S.U.V. max of 16.7.

A right acetabular lesion is relatively CT occult at a S.U.V. max of
9.7 on 195/4.

T11 spinous process lesion measures a S.U.V. max of 8.9.

Metastasis involving the left C1 arch is relatively CT occult at a
S.U.V. max of 7.7 on [DATE].

Incidental CT findings: none
IMPRESSION: 1. Widespread hypermetabolic metastatic disease, including to the
right pleural space, liver, thoracoabdominal nodes, bones. Probable
left-sided pleural metastasis as well.
2. Pulmonary nodules which are below PET resolution. Suspect mild
enlargement of a left lower lobe nodule, also suspicious for
metastatic disease.
3. Aortic atherosclerosis ([0R]-[0R]) and emphysema ([0R]-[0R]).

## 2020-09-10 MED ORDER — FLUDEOXYGLUCOSE F - 18 (FDG) INJECTION
9.0500 | Freq: Once | INTRAVENOUS | Status: AC | PRN
  Administered 2020-09-10: 9.05 via INTRAVENOUS

## 2020-09-11 ENCOUNTER — Encounter (HOSPITAL_COMMUNITY): Payer: Self-pay

## 2020-09-11 NOTE — Progress Notes (Signed)
[facility] OFFICE PROGRESS NOTE  Richard Nutting, Richard Li [address]  [address] [place] [address]  DIAGNOSIS: Metastatic non-small cell lung cancer initially diagnosed as stage IIB (T3, N0, M0) non-small cell lung cancer, adenocarcinoma presented with large central perihilar mass with suspicious groundglass opacity in the right middle lobe and left upper lobe diagnosed in September 2021.  The patient had evidence of metastatic disease to the right kidney, right pleural space, liver, thoracic:/Abdominal lymph nodes, and bones.  There is also a probable left-sided pleural metastasis as well in March 2022/May 2022.   Molecular studies by guardant 360: No actionable mutations.  PDl1: 80%  PRIOR THERAPY: Concurrent chemoradiation with weekly carboplatin for AUC of 2 and paclitaxel 45 mg/M2.  First dose February 10, 2020.  Status post 5  cycles.  CURRENT THERAPY: Palliative systemic chemotherapy with carboplatin AUC of 5, Alimta 500 mg per metered squared, Keytruda 200 mg IV every 3 weeks.  First dose expected on 09/23/2020.   INTERVAL HISTORY: Richard Li 60 y.o. male returns to the clinic today for a follow-up visit.  The patient is feeling fair today without any concerning complaints except for bilateral flank pain and left lateral breast pain. He takes two 325 mg tylenol BID and makes his pain "tolerable". The patient was last seen in the clinic on 08/28/2020.  The patient had a CT-guided biopsy of the right renal mass lesion which was consistent with metastatic adenocarcinoma.  Therefore, Dr. Julien Nordmann wanted to perform a restaging PET scan and perform guardant 360 molecular testing to see if he has any actionable mutations.  Otherwise the patient denies any recent fever or chills. He has been having night sweats on and off for about 1 month. He reports some shortness of breath on exertion which may have worsened over the last few weeks..  He has a cough but denies any  hemoptysis.  He denies any nausea, vomiting, diarrhea, or constipation.  He denies any headache or visual changes.  The patient is here today to review his scan and molecular studies and for a more detailed discussion about his current condition and recommended treatment options.  MEDICAL HISTORY: Past Medical History:  Diagnosis Date  . Allergic rhinitis, cause unspecified   . Anxiety state, unspecified   . Asthma    as a child  . Chronic airway obstruction, not elsewhere classified   . Esophageal reflux   . History of kidney stones   . Hypertension   . Lumbago   . lung ca dx'd 12/2019  . Other and unspecified hyperlipidemia   . Other chest pain     ALLERGIES:  is allergic to amoxicillin.  MEDICATIONS:  Current Outpatient Medications  Medication Sig Dispense Refill  . folic acid (FOLVITE) 1 MG tablet Take 1 tablet (1 mg total) by mouth daily. 30 tablet 2  . oxyCODONE-acetaminophen (PERCOCET/ROXICET) 5-325 MG tablet Take 1 tablet by mouth every 6 (six) hours as needed for severe pain. 30 tablet 0  . prochlorperazine (COMPAZINE) 10 MG tablet Take 1 tablet (10 mg total) by mouth every 6 (six) hours as needed. 30 tablet 2  . acetaminophen (TYLENOL) 500 MG tablet Take 1,000 mg by mouth 2 (two) times daily.    [name] dextromethorphan-guaiFENesin (MUCINEX DM) 30-600 MG 12hr tablet Take 1 tablet by mouth 2 (two) times daily.    [name] doxycycline (VIBRA-TABS) 100 MG tablet Take 1 tablet (100 mg total) by mouth 2 (two) times daily. 60 tablet 1  .  feeding supplement (ENSURE ENLIVE / ENSURE PLUS) LIQD Take 237 mLs by mouth 3 (three) times daily between meals. 237 mL 12  . fenofibrate 160 MG tablet Take 1 tablet by mouth once daily 90 tablet 3  . ferrous sulfate 325 (65 FE) MG tablet Take 1 tablet (325 mg total) by mouth daily with breakfast. 30 tablet 0  . Ipratropium-Albuterol (COMBIVENT RESPIMAT) 20-100 MCG/ACT AERS respimat Inhale 1-2 puffs into the lungs every 6 (six) hours as needed for wheezing. 4  g 3  . LORazepam (ATIVAN) 1 MG tablet Take 1 tablet (1 mg total) by mouth See admin instructions. Take one tablet (1 mg) by mouth daily at bedtime, may also take one tablet (1 mg) twice during the day as needed for anxiety 90 tablet 2  . meclizine (ANTIVERT) 25 MG tablet Take 1 tablet (25 mg total) by mouth every 6 (six) hours as needed for dizziness. 60 tablet 0  . Multiple Vitamin (MULTIVITAMIN WITH MINERALS) TABS tablet Take 1 tablet by mouth daily.    . predniSONE (DELTASONE) 10 MG tablet Take 1 tablet (10 mg total) by mouth daily with breakfast. 30 tablet 1   No current facility-administered medications for this visit.   Facility-Administered Medications Ordered in Other Visits  Medication Dose Route Frequency Provider Last Rate Last Admin  . 0.9 %  sodium chloride infusion   Intravenous Continuous Curt Bears, MD   Stopped at 03/05/20 1410    SURGICAL HISTORY:  Past Surgical History:  Procedure Laterality Date  . APPENDECTOMY    . BRONCHIAL BIOPSY  01/07/2020   Procedure: BRONCHIAL BIOPSIES;  Surgeon: Collene Gobble, MD;  Location: [facility] ENDOSCOPY;  Service: Pulmonary;;  . BRONCHIAL BIOPSY  04/05/2020   Procedure: BRONCHIAL BIOPSIES;  Surgeon: Rigoberto Noel, MD;  Location: [facility];  Service: Cardiopulmonary;;  . BRONCHIAL BRUSHINGS  01/07/2020   Procedure: BRONCHIAL BRUSHINGS;  Surgeon: Collene Gobble, MD;  Location: [facility] ENDOSCOPY;  Service: Pulmonary;;  . BRONCHIAL NEEDLE ASPIRATION BIOPSY  01/07/2020   Procedure: BRONCHIAL NEEDLE ASPIRATION BIOPSIES;  Surgeon: Collene Gobble, MD;  Location: [facility] ENDOSCOPY;  Service: Pulmonary;;  . BRONCHIAL WASHINGS  01/07/2020   Procedure: BRONCHIAL WASHINGS;  Surgeon: Collene Gobble, MD;  Location: [facility] ENDOSCOPY;  Service: Pulmonary;;  . BRONCHIAL WASHINGS  04/05/2020   Procedure: BRONCHIAL WASHINGS;  Surgeon: Rigoberto Noel, MD;  Location: [place];  Service: Cardiopulmonary;;  . COLONOSCOPY  15 years ago  . HEMOSTASIS CONTROL   01/07/2020   Procedure: HEMOSTASIS CONTROL;  Surgeon: Collene Gobble, MD;  Location: [facility] ENDOSCOPY;  Service: Pulmonary;;  cold saline  . NASAL TURBINATE REDUCTION  2002   Dr.Crossley  . VASECTOMY    . VIDEO BRONCHOSCOPY Right 04/05/2020   Procedure: VIDEO BRONCHOSCOPY WITH FLUORO;  Surgeon: Rigoberto Noel, MD;  Location: [place];  Service: Cardiopulmonary;  Laterality: Right;  [name] VIDEO BRONCHOSCOPY WITH ENDOBRONCHIAL NAVIGATION N/A 01/07/2020   Procedure: VIDEO BRONCHOSCOPY WITH ENDOBRONCHIAL NAVIGATION;  Surgeon: Collene Gobble, MD;  Location: [place] ENDOSCOPY;  Service: Pulmonary;  Laterality: N/A;  . VIDEO BRONCHOSCOPY WITH ENDOBRONCHIAL ULTRASOUND N/A 01/07/2020   Procedure: VIDEO BRONCHOSCOPY WITH ENDOBRONCHIAL ULTRASOUND;  Surgeon: Collene Gobble, MD;  Location: [place] ENDOSCOPY;  Service: Pulmonary;  Laterality: N/A;    REVIEW OF SYSTEMS:   Review of Systems  Constitutional: Negative for appetite change, chills, fatigue, fever and unexpected weight change.  HENT: Negative for mouth sores, nosebleeds, sore throat and trouble swallowing.   Eyes: Negative for eye problems  and icterus.  Respiratory: Positive for cough and dyspnea on exertion.  Negative for hemoptysis and wheezing.   Cardiovascular: Positive for left breast pain. negative for leg swelling.  Gastrointestinal: Negative for abdominal pain, constipation, diarrhea, nausea and vomiting.  Genitourinary: Negative for bladder incontinence, difficulty urinating, dysuria, frequency and hematuria.   Musculoskeletal: Positive for bilateral flank pain.  Negative for back pain, gait problem, neck pain and neck stiffness.  Skin: Negative for itching and rash.  Neurological: Negative for dizziness, extremity weakness, gait problem, headaches, light-headedness and seizures.  Hematological: Negative for adenopathy. Does not bruise/bleed easily.  Psychiatric/Behavioral: Negative for confusion, depression and sleep disturbance. The patient is not  nervous/anxious.     PHYSICAL EXAMINATION:  Blood pressure 119/70, pulse (!) 103, temperature 98.4 F (36.9 C), temperature source Tympanic, resp. rate 17, height 5' 10"  (1.778 m), weight 187 lb (84.8 kg), SpO2 100 %.  ECOG PERFORMANCE STATUS: 1 - Symptomatic but completely ambulatory  Physical Exam  Constitutional: Oriented to person, place, and time and well-developed, well-nourished, and in no distress.  HENT:  Head: Normocephalic and atraumatic.  Mouth/Throat: Oropharynx is clear and moist. No oropharyngeal exudate.  Eyes: Conjunctivae are normal. Right eye exhibits no discharge. Left eye exhibits no discharge. No scleral icterus.  Neck: Normal range of motion. Neck supple.  Cardiovascular: Normal rate, regular rhythm, normal heart sounds and intact distal pulses.   Pulmonary/Chest: Effort normal and breath sounds normal. No respiratory distress. No wheezes. No rales.  Abdominal: Soft. Bowel sounds are normal. Exhibits no distension and no mass. There is no tenderness.  Musculoskeletal: Tenderness to palpation over the left breast/rib.  Normal range of motion. Exhibits no edema.  Lymphadenopathy:    No cervical adenopathy.  Neurological: Alert and oriented to person, place, and time. Exhibits normal muscle tone. Gait normal. Coordination normal.  Skin: Skin is warm and dry. No rash noted. Not diaphoretic. No erythema. No pallor.  Psychiatric: Mood, memory and judgment normal.  Vitals reviewed.  LABORATORY DATA: Lab Results  Component Value Date   WBC 7.2 09/14/2020   HGB 11.9 (L) 09/14/2020   HCT 36.9 (L) 09/14/2020   MCV 84.4 09/14/2020   PLT 239 09/14/2020      Chemistry      Component Value Date/Time   NA 141 09/14/2020 0812   K 3.5 09/14/2020 0812   CL 106 09/14/2020 0812   CO2 27 09/14/2020 0812   BUN 16 09/14/2020 0812   CREATININE 0.95 09/14/2020 0812   CREATININE 0.67 (L) 06/24/2020 1517      Component Value Date/Time   CALCIUM 9.9 09/14/2020 0812    ALKPHOS 65 09/14/2020 0812   AST 15 09/14/2020 0812   ALT 9 09/14/2020 0812   BILITOT 0.4 09/14/2020 1829       RADIOGRAPHIC STUDIES:  NM PET Image Restag (PS) Skull Base To Thigh  Result Date: 09/11/2020 CLINICAL DATA:  Subsequent treatment strategy for non-small-cell lung cancer diagnosed last September. Radiation therapy and chemotherapy complete. EXAM: NUCLEAR MEDICINE PET SKULL BASE TO THIGH TECHNIQUE: mCi F-18 FDG was injected intravenously. Full-ring PET imaging was performed from the skull base to thigh after the radiotracer. CT data was obtained and used for attenuation correction and anatomic localization. Fasting blood glucose:  mg/dl COMPARISON:  Chest CT of 07/20/2020. Abdominal MRI of 06/27/2020. Most recent PET of 01/16/2020. FINDINGS: Mediastinal blood pool activity: SUV max 2.3 Liver activity: SUV max NA NECK: No cervical nodal hypermetabolism. Lower cervical left-sided scalene muscular hypermetabolism is likely due to  motion after radiopharmaceutical. Incidental CT findings: No cervical adenopathy. CHEST: Circumferential right apical pleural thickening with low-level hypermetabolism is is suspicious but may be partially treatment related. However, new areas of extensive right-sided pleural irregularity and hypermetabolism more inferiorly are consistent with metastatic disease. Example at 7 mm and a S.U.V. max of 7.2 on 103/4. Hypermetabolism along the right cardiac border is favored to be due to medial pleural disease including at a S.U.V. max of 11.2 on image 100/4. Right paratracheal node measures 6 mm and a S.U.V. max of 5.0 on 75/4. Probable left-sided pleural disease is evidenced by hypermetabolism which is favored to be pleural rather than arising from the adjacent rib. Example at a S.U.V. max of 5.3 on 85/4. Incidental CT findings: Aortic atherosclerosis. Moderate centrilobular emphysema. Consolidation and cavitation in the right apex at the site of treated tumor. Index left  lower lobe pulmonary nodule of 7 mm on 43/8 measured 6 mm on 07/20/2020. ABDOMEN/PELVIS: New metastasis within the high left hepatic lobe at 1.4 cm and a S.U.V. max of 12.0 on 118/4 . The previously detailed inter/upper pole right renal mass is hypermetabolic, on the order of 3.3 cm and a S.U.V. max of 13.0 on 139/4. Retrocaval node measures 7 mm and a S.U.V. max of 8.8 on 141/4. Incidental CT findings: Abdominal aortic atherosclerosis. Mild prostatomegaly. SKELETON: Widespread osseous metastasis. Index T12 lesion measures a S.U.V. max of 16.7. A right acetabular lesion is relatively CT occult at a S.U.V. max of 9.7 on 195/4. T11 spinous process lesion measures a S.U.V. max of 8.9. Metastasis involving the left C1 arch is relatively CT occult at a S.U.V. max of 7.7 on 28/4. Incidental CT findings: none IMPRESSION: 1. Widespread hypermetabolic metastatic disease, including to the right pleural space, liver, thoracoabdominal nodes, bones. Probable left-sided pleural metastasis as well. 2. Pulmonary nodules which are below PET resolution. Suspect mild enlargement of a left lower lobe nodule, also suspicious for metastatic disease. 3. Aortic atherosclerosis (ICD10-I70.0) and emphysema (ICD10-J43.9). Electronically Signed   By: Abigail Miyamoto M.D.   On: 09/11/2020 09:15   CT RENAL BIOPSY  Result Date: 08/26/2020 INDICATION: 60 year old male with an irregular mass in the interpolar right kidney. Patient has a history of pulmonary adenocarcinoma. Findings are concerning for urothelial carcinoma versus metastatic lesion to the kidney. Patient presents for image guided core biopsy EXAM: CT BIOPSY CORE RENAL MEDICATIONS: None. ANESTHESIA/SEDATION: Moderate (conscious) sedation was employed during this procedure. A total of Versed 1 mg and Fentanyl 50 mcg was administered intravenously. Moderate Sedation Time: 15 minutes. The patient's level of consciousness and vital signs were monitored continuously by radiology nursing  throughout the procedure under my direct supervision. FLUOROSCOPY TIME:  None COMPLICATIONS: None immediate. PROCEDURE: Informed written consent was obtained from the patient after a thorough discussion of the procedural risks, benefits and alternatives. All questions were addressed. Maximal Sterile Barrier Technique was utilized including caps, mask, sterile gowns, sterile gloves, sterile drape, hand hygiene and skin antiseptic. A timeout was performed prior to the initiation of the procedure. Initial CT imaging was performed. The mass is nearly occult by CT. Therefore, ultrasound was performed while the patient was on the CT gantry. Ultrasound demonstrates the irregular mass in the central aspect of the kidney. A suitable skin entry site was selected and marked. The skin was sterilely prepped and draped in the standard fashion using chlorhexidine skin prep. Local anesthesia was attained by infiltration with 1% lidocaine. A small dermatotomy was made. Under real-time ultrasound guidance, an 36  gauge trocar needle was advanced into the margin of the mass. Multiple 18 gauge core biopsies were then obtained coaxially using the bio Pince automated biopsy device. Biopsy specimens were placed in formalin and delivered to pathology for further analysis. Repeat CT imaging was performed demonstrating a very small amount of perinephric hemorrhage. This is not unexpected. Patient remained clinically asymptomatic. IMPRESSION: Technically successful combined ultrasound and CT guided biopsy of right renal mass. Electronically Signed   By: Jacqulynn Cadet M.D.   On: 08/26/2020 12:21     ASSESSMENT/PLAN:  This is a very pleasant 60 year old Caucasian male with metastatic non-small cell lung cancer.  He was initially diagnosed as a stage IIb (T3, N0, M0) non-small cell lung cancer, adenocarcinoma.  He originally presented with a perihilar right upper lobe lung mass and suspicious groundglass opacity in the right middle lobe.   He had evidence of metastatic disease in March/ May 2022 with a biopsy-proven metastatic lesion in the right kidney as well as right pleural space, liver, thoracic/Abdominal lymph nodes, and bones.  There is also a probable left-sided pleural metastasis.  The patient's PD-L1 expression is 80%.  He is negative for any actionable mutations by guardant 360.  The patient was not a good candidate for resection according to Dr. Roxan Hockey.  Therefore, the patient underwent a course of concurrent chemoradiation with weekly carboplatin for an AUC of 2 and paclitaxel 45 mg per metered squared status post 5 cycles.  He experienced some fatigue, mild odynophagia as well as recurrent pneumonia and septicemia.  He also had radiation-induced pneumonitis.  He was admitted to the hospital several times with recurrent pneumonia.   In March 2022, the patient had a biopsy confirmed metastatic lesion to the kidney.  Therefore, Dr. Julien Nordmann recommended that the patient repeat the staging work-up with a PET scan to restage his disease.  He also arrange for the patient to have molecular testing performed to see what treatment options he is a candidate for.  This was negative for any actionable mutations.   The patient was seen with Dr. Julien Nordmann today.  Dr. Julien Nordmann discussed that he has metastatic disease. Dr. Julien Nordmann had a lengthly discussion with the patient today about her current condition and treatment options. The patient was given the option of a referral to hospice/palliative vs. Treatment with systemic chemotherapy with carboplatin for an AUC of 5, Alimta 500 mg/m, and Keytruda 200 mg IV every 3 weeks vs. Single agent immunotherapy with Keytruda.  The patient is interested in proceeding with systemic chemotherapy/immunotherapy.  Dr. Julien Nordmann discussed that if the patient is not able to tolerate chemotherapy then he would proceed on single agent immunotherapy with [facility]. He is expected to start her first dose of this  treatment on 09/23/20.  We discussed the adverse side effects of treatment including but not limited to alopecia, myelosuppression, nausea and vomiting, peripheral neuropathy, liver or renal dysfunction as well as immunotherapy mediated adverse effects.   We will arrange for the patient to have a B12 injection while in the clinic today.     I sent prescriptions for 1 mg folic acid p.o. daily as well as Compazine 10 mg every 6 hours as needed for nausea.  I also sent prescription for Percocet every 6 hours as needed for pain.  The patient will follow-up in 2 weeks for a one-week follow-up visit after completing his first cycle of chemotherapy.  I have referred the patient to radiation oncology for consideration of palliative radiotherapy to some of the  metastatic bone lesions.  We will arrange for restaging brain MRI to rule out metastatic disease to the brain.  The patient was advised to call immediately if she has any concerning symptoms in the interval. The patient voices understanding of current disease status and treatment options and is in agreement with the current care plan. All questions were answered. The patient knows to call the clinic with any problems, questions or concerns. We can certainly see the patient much sooner if necessary        Orders Placed This Encounter  Procedures  . MR Brain W Wo Contrast    Standing Status:   Future    Standing Expiration Date:   09/14/2021    Order Specific Question:   If indicated for the ordered procedure, I authorize the administration of contrast media per Radiology protocol    Answer:   Yes    Order Specific Question:   What is the patient's sedation requirement?    Answer:   No Sedation    Order Specific Question:   Does the patient have a pacemaker or implanted devices?    Answer:   No    Order Specific Question:   Use SRS Protocol?    Answer:   No    Order Specific Question:   Preferred imaging location?    Answer:   [facility] (table limit - 550 lbs)  . CBC with Differential (Cancer Center Only)    Standing Status:   Standing    Number of Occurrences:   12    Standing Expiration Date:   09/14/2021  . CMP ([place] only)    Standing Status:   Standing    Number of Occurrences:   12    Standing Expiration Date:   09/14/2021  . TSH    Standing Status:   Standing    Number of Occurrences:   4    Standing Expiration Date:   09/14/2021  . Ambulatory referral to Radiation Oncology    Referral Priority:   Routine    Referral Type:   Consultation    Referral Reason:   Specialty Services Required    Requested Specialty:   Radiation Oncology    Number of Visits Requested:   [place], PA-C 09/14/20  ADDENDUM: Hematology/Oncology Attending: I had a face-to-face encounter with the patient today.  I reviewed his records, scan and recommended his care plan.  This is a very pleasant 60 years old white male with stage IV non-small cell lung cancer that was initially diagnosed as unresectable stage IIb (T3, N0, M0) adenocarcinoma status post a course of concurrent chemoradiation discontinued secondary to intolerance and frequent hospitalization with pneumonia and radiation pneumonitis. The patient was in observation and recently was found to have evidence of metastatic disease to the right kidney which was consistent with metastatic adenocarcinoma of lung primary. The patient underwent PET scan on 09/10/2020 and it showed widespread hypermetabolic metastatic disease including the right pleural space, liver, thoracoabdominal nodes, bones as well as probable left sided pleural metastasis in addition to pulmonary nodules in the left lower lobe. I had a lengthy discussion with the patient and his wife today about his current condition and treatment options. I recommended for the patient to complete the staging work-up by ordering MRI of the brain to rule out brain metastasis. The molecular  studies by Guardant 360 showed no actionable mutations. Has PD-L1 expression performed at the time of  the initial diagnosis was 80%. I discussed with the patient his treatment options including palliative care versus palliative systemic chemotherapy either with single agent immunotherapy with Libtayo (Cempilimab) or Keytruda versus a combination of systemic chemotherapy with carboplatin, Alimta and Keytruda.  The patient would like to proceed with the combination of systemic chemotherapy and Keytruda. Is expected to start the first cycle of this treatment next week. I discussed with the patient the adverse effect of this treatment including but not limited to mild alopecia, myelosuppression, nausea and vomiting, peripheral neuropathy, liver or renal dysfunction as well as immunotherapy adverse effects. The patient will receive vitamin B12 injection today. Will call his pharmacy with prescription for folic acid and Compazine. He will come back for follow-up visit in 2 weeks for evaluation and management of any adverse effect of his treatment. The patient was advised to call immediately if he has any other concerning symptoms in the interval. The total time spent in the appointment was 40 minutes.  Disclaimer: This note was dictated with voice recognition software. Similar sounding words can inadvertently be transcribed and may be missed upon review. Eilleen Kempf, MD 09/14/20

## 2020-09-14 ENCOUNTER — Encounter: Payer: Self-pay | Admitting: Internal Medicine

## 2020-09-14 ENCOUNTER — Inpatient Hospital Stay (HOSPITAL_BASED_OUTPATIENT_CLINIC_OR_DEPARTMENT_OTHER): Payer: BC Managed Care – PPO | Admitting: Physician Assistant

## 2020-09-14 ENCOUNTER — Other Ambulatory Visit: Payer: Self-pay

## 2020-09-14 ENCOUNTER — Inpatient Hospital Stay: Payer: BC Managed Care – PPO

## 2020-09-14 ENCOUNTER — Other Ambulatory Visit: Payer: Self-pay | Admitting: Medical Oncology

## 2020-09-14 ENCOUNTER — Other Ambulatory Visit: Payer: Self-pay | Admitting: Internal Medicine

## 2020-09-14 VITALS — BP 119/70 | HR 103 | Temp 98.4°F | Resp 17 | Ht 70.0 in | Wt 187.0 lb

## 2020-09-14 DIAGNOSIS — G893 Neoplasm related pain (acute) (chronic): Secondary | ICD-10-CM | POA: Insufficient documentation

## 2020-09-14 DIAGNOSIS — Z7189 Other specified counseling: Secondary | ICD-10-CM | POA: Diagnosis not present

## 2020-09-14 DIAGNOSIS — C3482 Malignant neoplasm of overlapping sites of left bronchus and lung: Secondary | ICD-10-CM | POA: Diagnosis not present

## 2020-09-14 DIAGNOSIS — I10 Essential (primary) hypertension: Secondary | ICD-10-CM | POA: Diagnosis not present

## 2020-09-14 DIAGNOSIS — C3491 Malignant neoplasm of unspecified part of right bronchus or lung: Secondary | ICD-10-CM

## 2020-09-14 DIAGNOSIS — Z5111 Encounter for antineoplastic chemotherapy: Secondary | ICD-10-CM

## 2020-09-14 DIAGNOSIS — I878 Other specified disorders of veins: Secondary | ICD-10-CM

## 2020-09-14 DIAGNOSIS — Z5112 Encounter for antineoplastic immunotherapy: Secondary | ICD-10-CM | POA: Insufficient documentation

## 2020-09-14 LAB — CMP (CANCER CENTER ONLY)
ALT: 9 U/L (ref 0–44)
AST: 15 U/L (ref 15–41)
Albumin: 3.8 g/dL (ref 3.5–5.0)
Alkaline Phosphatase: 65 U/L (ref 38–126)
Anion gap: 8 (ref 5–15)
BUN: 16 mg/dL (ref 6–20)
CO2: 27 mmol/L (ref 22–32)
Calcium: 9.9 mg/dL (ref 8.9–10.3)
Chloride: 106 mmol/L (ref 98–111)
Creatinine: 0.95 mg/dL (ref 0.61–1.24)
GFR, Estimated: >60 mL/min (ref >60)
Glucose, Bld: 85 mg/dL (ref 70–99)
Potassium: 3.5 mmol/L (ref 3.5–5.1)
Sodium: 141 mmol/L (ref 135–145)
Total Bilirubin: 0.4 mg/dL (ref 0.3–1.2)
Total Protein: 7.3 g/dL (ref 6.5–8.1)

## 2020-09-14 LAB — CBC WITH DIFFERENTIAL (CANCER CENTER ONLY)
Abs Immature Granulocytes: 0.02 10*3/uL (ref 0.00–0.07)
Basophils Absolute: 0.1 10*3/uL (ref 0.0–0.1)
Basophils Relative: 1 %
Eosinophils Absolute: 0.3 10*3/uL (ref 0.0–0.5)
Eosinophils Relative: 4 %
HCT: 36.9 % — ABNORMAL LOW (ref 39.0–52.0)
Hemoglobin: 11.9 g/dL — ABNORMAL LOW (ref 13.0–17.0)
Immature Granulocytes: 0 %
Lymphocytes Relative: 17 %
Lymphs Abs: 1.3 10*3/uL (ref 0.7–4.0)
MCH: 27.2 pg (ref 26.0–34.0)
MCHC: 32.2 g/dL (ref 30.0–36.0)
MCV: 84.4 fL (ref 80.0–100.0)
Monocytes Absolute: 0.9 10*3/uL (ref 0.1–1.0)
Monocytes Relative: 12 %
Neutro Abs: 4.7 10*3/uL (ref 1.7–7.7)
Neutrophils Relative %: 66 %
Platelet Count: 239 10*3/uL (ref 150–400)
RBC: 4.37 MIL/uL (ref 4.22–5.81)
RDW: 17.1 % — ABNORMAL HIGH (ref 11.5–15.5)
WBC Count: 7.2 10*3/uL (ref 4.0–10.5)
nRBC: 0 % (ref 0.0–0.2)

## 2020-09-14 MED ORDER — FOLIC ACID 1 MG PO TABS: 1.0000 mg | ORAL_TABLET | Freq: Every day | ORAL | 2 refills | Status: DC

## 2020-09-14 MED ORDER — CYANOCOBALAMIN 1000 MCG/ML IJ SOLN
1000.0000 ug | Freq: Once | INTRAMUSCULAR | Status: AC
  Administered 2020-09-14: 1000 ug via INTRAMUSCULAR

## 2020-09-14 MED ORDER — CYANOCOBALAMIN 1000 MCG/ML IJ SOLN
INTRAMUSCULAR | Status: AC
  Filled 2020-09-14: qty 1

## 2020-09-14 MED ORDER — OXYCODONE-ACETAMINOPHEN 5-325 MG PO TABS: 1.0000 | ORAL_TABLET | Freq: Four times a day (QID) | ORAL | 0 refills | Status: DC | PRN

## 2020-09-14 MED ORDER — CYANOCOBALAMIN 1000 MCG/ML IJ SOLN: 1000.0000 ug | Freq: Once | INTRAMUSCULAR | Status: DC

## 2020-09-14 MED ORDER — PROCHLORPERAZINE MALEATE 10 MG PO TABS: 10.0000 mg | ORAL_TABLET | Freq: Four times a day (QID) | ORAL | 2 refills | Status: AC | PRN

## 2020-09-14 NOTE — Progress Notes (Signed)
DISCONTINUE ON PATHWAY REGIMEN - Non-Small Cell Lung     Administer weekly:     Paclitaxel      Carboplatin   **Always confirm dose/schedule in your pharmacy ordering system**  REASON: Disease Progression PRIOR TREATMENT: [id number]: Carboplatin AUC=2 + Paclitaxel 45 mg/m2 Weekly During Radiation TREATMENT RESPONSE: Stable Disease (SD)  START ON PATHWAY REGIMEN - Non-Small Cell Lung     A cycle is every 21 days:     Pembrolizumab      Pemetrexed      Carboplatin   **Always confirm dose/schedule in your pharmacy ordering system**  Patient Characteristics: Stage IV Metastatic, Nonsquamous, Molecular Analysis Completed, Molecular Alteration Present and Targeted Therapy Exhausted OR EGFR Exon 20+ or KRAS G12C+ Present and No Prior Chemo/Immunotherapy OR No Alteration Present, Initial  Chemotherapy/Immunotherapy, PS = 0, 1, No Alteration Present, No Alteration Present, Candidate for Immunotherapy, PD-L1 Expression Positive  ? 50% (TPS) and Immunotherapy Candidate Therapeutic Status: Stage IV Metastatic Histology: Nonsquamous Cell Broad Molecular Profiling Status: Engineer, manufacturing Analysis Results: No Alteration Present ECOG Performance Status: 1 Chemotherapy/Immunotherapy Line of Therapy: Initial Chemotherapy/Immunotherapy EGFR Exons 18-21 Mutation Testing Status: Completed and Negative ALK Fusion/Rearrangement Testing Status: Completed and Negative BRAF V600 Mutation Testing Status: Completed and Negative KRAS G12C Mutation Testing Status: Completed and Negative MET Exon 14 Mutation Testing Status: Completed and Negative RET Fusion/Rearrangement Testing Status: Completed and Negative NTRK Fusion/Rearrangement Testing Status: Completed and Negative ROS1 Fusion/Rearrangement Testing Status: Completed and Negative Immunotherapy Candidate Status: Candidate for Immunotherapy PD-L1 Expression Status: PD-L1 Positive ? 50% (TPS) Intent of Therapy: Non-Curative /  Palliative Intent, Discussed with Patient

## 2020-09-14 NOTE — Progress Notes (Signed)
Histology and Location of Primary Cancer: right lung  Sites of Visceral and Bony Metastatic Disease: He had evidence of metastatic disease in March/ May 2022 with a biopsy-proven metastatic lesion in the right kidney as well as right pleural space, liver, thoracic/Abdominal lymph nodes, and bones.  There is also a probable left-sided pleural metastasis.  Location(s) of Symptomatic Metastases: bilateral flank pain  Past/Anticipated chemotherapy by medical oncology, if any: Dr Julien Nordmann PRIOR THERAPY: Concurrent chemoradiation with weekly carboplatin for AUC of 2 and paclitaxel 45 mg/M2. First dose February 10, 2020. Status post 5 cycles.  CURRENT THERAPY: Palliative systemic chemotherapy with carboplatin AUC of 5, Alimta 500 mg per metered squared, Keytruda 200 mg IV every 3 weeks.  First dose expected on 09/23/2020.   Pain on a scale of 0-10 is: 6 in bilateral flank   If Spine Met(s), symptoms, if any, include:  Bowel/Bladder retention or incontinence (please describe): denies bladder retention or incontinence. Reports hematuria 2-3 times within the last 2 months but none within the last 2 weeks  Numbness or weakness in extremities (please describe): Reports tingling in right foot and weakness in arms and legs. Reports general weakness.  Current Decadron regimen, if applicable: none  Ambulatory status? Walker? Wheelchair?: Ambulatory  SAFETY ISSUES:  Prior radiation? yes, right lung beginning 02/07/2020  Pacemaker/ICD? no  Possible current pregnancy? no  Is the patient on methotrexate? no  Current Complaints / other details:  General weakness  Vitals:   09/16/20 0916  BP: (!) 142/59  Pulse: 99  Resp: 18  Temp: 98 F (36.7 C)  SpO2: 100%  Weight: 191 lb (86.6 kg)  Height: 5' 10"  (1.778 m)

## 2020-09-14 NOTE — Progress Notes (Signed)
ON PATHWAY REGIMEN - Non-Small Cell Lung  No Change  Continue With Treatment as Ordered.  Original Decision Date/Time: 01/30/2020 15:06     Administer weekly:     Paclitaxel      Carboplatin   **Always confirm dose/schedule in your pharmacy ordering system**  Patient Characteristics: Preoperative or Nonsurgical Candidate (Clinical Staging), Stage II, Nonsurgical Candidate Therapeutic Status: Preoperative or Nonsurgical Candidate (Clinical Staging) AJCC T Category: cT3 AJCC N Category: cN0 AJCC M Category: cM0 AJCC 8 Stage Grouping: IIB Intent of Therapy: Curative Intent, Discussed with Patient

## 2020-09-14 NOTE — Patient Instructions (Signed)
Summary:  -There are two main categories of lung cancer, they are named based on the size of the cancer cell. One is called Non-Small cell lung cancer. The other type is Small Cell Lung Cancer -The sample (biopsy) that they took of your tumor was consistent with a subtype of Non-small cell lung cancer called Adenocarcinoma. This is the most common type of lung cancer.  -We covered a lot of important information at your appointment today regarding what the treatment plan is moving forward. Here are the the main points that were discussed at your office visit with [place] today:  -The treatment that you will receive consists of two chemotherapy drugs, called Carboplatin and Alimta (also called Pemetrexed) and one immunotherapy drug called Keytruda (pembrolizumab).  -We are planning on starting your treatment next week on 09/23/20  -Your treatment will be given once every 3 weeks. We will check your labs once a week for the first ~5 treatments just to make sure that important components of your blood are in an acceptable range -We will get a CT scan after 3 treatments to check on the progress of treatment  Medications:  -I have sent a few important medication prescriptions to your pharmacy.  -Compazine was sent to your pharmacy. This medication is for nausea. You may take this every 6 hours as needed if you feel nauseous.  -I have also sent a prescription for 1 mg of folic acid to your pharmacy. We need you to take 1 tablet every day.  -We will administer vitamin B12 every 9 weeks while you are here in the clinic. You have received your first dose today.   Referrals or Imaging: -I will order a repeat brain MRI to make sure nothing spread to this area.  -I will place an order to radiation oncology.   Follow up:  -We will see you back for a follow up visit 1 week after your first treatment to see how it went and help manage any side effects of treatment that you may have   -If you need to reach [place] at any  time, the main office number to the cancer center is [phone], when you call, ask to speak to either Cassie's or Dr. Worthy Flank nurse.

## 2020-09-15 ENCOUNTER — Telehealth: Payer: Self-pay | Admitting: *Deleted

## 2020-09-15 NOTE — Telephone Encounter (Signed)
Begin processing PA for CoverMyMeds Key: [id number] request received today for Oxycodone-acetaminophen 5-325 mg reads "Archived" per [facility].  Reached [facility] for clarification.   "Richard Li came in, picked order up paying zero cost." No further activity performed or required by this nurse.

## 2020-09-15 NOTE — Progress Notes (Signed)
Radiation Oncology         [phone] ________________________________  Follow-up New Visit Note   Name: Richard Li MRN: [id number]  Date: 09/16/2020  DOB: 09-30-1960  VQ:[id number], Einar Pheasant, DO  Curt Bears, MD   REFERRING PHYSICIAN: Curt Bears, MD  DIAGNOSIS: Stage IIB (T3, N0, M0) non-small cell lung cancer, adenocarcinoma presented with large central perihilar mass with suspicious groundglass opacity in the right middle lobe and left upper lobe diagnosed in September 2021.  Interval since last radiation: 5 months, 3 weeks, 4 days   Radiation treatment dates: 02/12/2021- 03/24/2020  Dose: 45 of 45 (Gy) delivered, 25 of 25 Fx delivered     Narrative: The patient returns today for follow-up  regarding the treatment of his non-small cell right upper lobe adenocarcinoma. Since his initial consultation on 02/10/2021, the patient underwent radiation treatment from 02/13/20-03/24/20. Patient reported  tolerating this treatment well except for fatigue, mild odynophagia as well as recurrent pneumonia and septicemia.  The patient also has radiation-induced pneumonitis.  Although he tolerated treatment well, the patient presented to the [facility] on 04/04/2020 for multifocal pneumonia and was admitted for three days. He presented with increasing weakness, shortness of breath, persistent productive cough of frothy white phlegm. In the ED the patient was noted to be afebrile, and his chest x-ray showed worsened aeration of the right lung likely due to increased infiltrates. The patient was last discharged from [facility] on 03/24/2020 after being managed for septic shock likely 2/2 multifocal pneumonia/aspiration pneumonia. (It is worth noting that the patient was also admitted to ED on 03/13/2020 for sepsis with acute organ dysfunction.)  On 04/05/2020, the patient underwent a bronchoscopy with biopsy to the right lower lobe bronchus. Findings from surgical pathology showed benign  bronchial wall and scant lung parenchyma with mild reactive changes. No malignancy was identified upon biopsy.   The patient followed up at [facility] on 04/13/20 regarding his most recent hospitalization for pneumonia on 04/04/20. Patient stated that his SOB and fatigue had gotten worse since being home following recent bronchoscopy. He reported lime green phlegm as well.  Chest CT received by the patient on 04/17/20 showed the cavitary lung cancer over the central right upper lobe had grown, with the cavitary component measuring 6 cm at the largest measurement (previously measuring 2 x 4.1 x 2.7 cm). Stable adjacent consolidation with worsening consolidation was seen over the anteromedial right upper lobe/middle lobe. There was persistent patchy airspace process seen throughout the left lung which was slightly worse overall, this may have been due to ongoing infection. Chest CT also showed the stable 5-6 mm solid nodule over the left lower lobe, and the stable 1.6 cm subcarinal lymph node.  The patient was again admitted to ED on 01/14 2022 reporting left-sided pleuritic chest pain worsening with cough and chest movement and dyspnea on exertion. He denied any coughing up blood fever or chills. Chest x-ray taken showed concerning increasing right upper lobe infiltration for pneumonia. CTA taken was negative for PE, but showed persistent infiltrates consolidation on the right mid and upper lobe.   The patient followed up with Dr. Julien Nordmann on 07/22/2020 to discuss recent repeat chest CT results from 07/20/2020. Dr. Julien Nordmann reviewed the scan and observed that there was no evidence for disease progression and there was gradual resolution of the inflammatory process in the right lung. Dr. Julien Nordmann also mentioned that the patient is feeling much better in regards to his pneumonia since his last hospital visit with  no further admissions.  Patient received PET scan on 09/10/2020 showing probable left-sided  pleural metastasis as well as suspected mild enlargement of a left lower lobe nodule, also suspicious for metastatic disease.  PET scan was also significant for osseous metastasis.  Patient is been having significant pain in the left anterior rib cage area as well as the mid back area.  He is now referred to radiation oncology for consideration of palliative treatment for his painful osseous metastasis.  PREVIOUS RADIATION THERAPY: yes  PAST MEDICAL HISTORY:  Past Medical History:  Diagnosis Date  . Allergic rhinitis, cause unspecified   . Anxiety state, unspecified   . Asthma    as a child  . Chronic airway obstruction, not elsewhere classified   . Esophageal reflux   . History of kidney stones   . History of radiation therapy 02/13/2020-03/24/2020   right lung        Dr Gery Pray  . Hypertension   . Lumbago   . lung ca dx'd 12/2019  . Other and unspecified hyperlipidemia   . Other chest pain     PAST SURGICAL HISTORY: Past Surgical History:  Procedure Laterality Date  . APPENDECTOMY    . BRONCHIAL BIOPSY  01/07/2020   Procedure: BRONCHIAL BIOPSIES;  Surgeon: Collene Gobble, MD;  Location: [facility] ENDOSCOPY;  Service: Pulmonary;;  . BRONCHIAL BIOPSY  04/05/2020   Procedure: BRONCHIAL BIOPSIES;  Surgeon: Rigoberto Noel, MD;  Location: [place];  Service: Cardiopulmonary;;  . BRONCHIAL BRUSHINGS  01/07/2020   Procedure: BRONCHIAL BRUSHINGS;  Surgeon: Collene Gobble, MD;  Location: [facility] ENDOSCOPY;  Service: Pulmonary;;  . BRONCHIAL NEEDLE ASPIRATION BIOPSY  01/07/2020   Procedure: BRONCHIAL NEEDLE ASPIRATION BIOPSIES;  Surgeon: Collene Gobble, MD;  Location: [facility] ENDOSCOPY;  Service: Pulmonary;;  . BRONCHIAL WASHINGS  01/07/2020   Procedure: BRONCHIAL WASHINGS;  Surgeon: Collene Gobble, MD;  Location: [facility] ENDOSCOPY;  Service: Pulmonary;;  . BRONCHIAL WASHINGS  04/05/2020   Procedure: BRONCHIAL WASHINGS;  Surgeon: Rigoberto Noel, MD;  Location: [place];  Service:  Cardiopulmonary;;  . COLONOSCOPY  15 years ago  . HEMOSTASIS CONTROL  01/07/2020   Procedure: HEMOSTASIS CONTROL;  Surgeon: Collene Gobble, MD;  Location: [facility] ENDOSCOPY;  Service: Pulmonary;;  cold saline  . NASAL TURBINATE REDUCTION  2002   Dr.Crossley  . VASECTOMY    . VIDEO BRONCHOSCOPY Right 04/05/2020   Procedure: VIDEO BRONCHOSCOPY WITH FLUORO;  Surgeon: Rigoberto Noel, MD;  Location: [place];  Service: Cardiopulmonary;  Laterality: Right;  [name] VIDEO BRONCHOSCOPY WITH ENDOBRONCHIAL NAVIGATION N/A 01/07/2020   Procedure: VIDEO BRONCHOSCOPY WITH ENDOBRONCHIAL NAVIGATION;  Surgeon: Collene Gobble, MD;  Location: [place] ENDOSCOPY;  Service: Pulmonary;  Laterality: N/A;  . VIDEO BRONCHOSCOPY WITH ENDOBRONCHIAL ULTRASOUND N/A 01/07/2020   Procedure: VIDEO BRONCHOSCOPY WITH ENDOBRONCHIAL ULTRASOUND;  Surgeon: Collene Gobble, MD;  Location: [place] ENDOSCOPY;  Service: Pulmonary;  Laterality: N/A;    FAMILY HISTORY:  Family History  Problem Relation Age of Onset  . Breast cancer Mother   . Cancer Father   . Heart attack Brother   . Emphysema Maternal Uncle   . COPD Maternal Uncle   . Heart disease Maternal Uncle   . Colon cancer Neg Hx     SOCIAL HISTORY:  Social History   Tobacco Use  . Smoking status: Former Smoker    Packs/day: 2.00    Years: 28.00    Pack years: 56.00    Types: Cigarettes    Quit date:  04/25/2005    Years since quitting: 15.4  . Smokeless tobacco: Never Used  Vaping Use  . Vaping Use: Never used  Substance Use Topics  . Alcohol use: Yes    Alcohol/week: 12.0 standard drinks    Types: 12 Cans of beer per week    Comment: social use  . Drug use: No    ALLERGIES:  Allergies  Allergen Reactions  . Amoxicillin Rash    MEDICATIONS:  Current Outpatient Medications  Medication Sig Dispense Refill  . acetaminophen (TYLENOL) 500 MG tablet Take 1,000 mg by mouth 2 (two) times daily.    [name] dextromethorphan-guaiFENesin (MUCINEX DM) 30-600 MG 12hr tablet Take 1  tablet by mouth 2 (two) times daily.    [name] doxycycline (VIBRA-TABS) 100 MG tablet Take 1 tablet (100 mg total) by mouth 2 (two) times daily. 60 tablet 1  . feeding supplement (ENSURE ENLIVE / ENSURE PLUS) LIQD Take 237 mLs by mouth 3 (three) times daily between meals. (Patient taking differently: Take 237 mLs by mouth 2 (two) times daily between meals.) 237 mL 12  . fenofibrate 160 MG tablet Take 1 tablet by mouth once daily (Patient taking differently: Take 160 mg by mouth daily.) 90 tablet 3  . ferrous sulfate 325 (65 FE) MG tablet Take 1 tablet (325 mg total) by mouth daily with breakfast. 30 tablet 0  . folic acid (FOLVITE) 1 MG tablet Take 1 tablet (1 mg total) by mouth daily. 30 tablet 2  . Ipratropium-Albuterol (COMBIVENT RESPIMAT) 20-100 MCG/ACT AERS respimat Inhale 1-2 puffs into the lungs every 6 (six) hours as needed for wheezing. 4 g 3  . LORazepam (ATIVAN) 1 MG tablet Take 1 tablet (1 mg total) by mouth See admin instructions. Take one tablet (1 mg) by mouth daily at bedtime, may also take one tablet (1 mg) twice during the day as needed for anxiety (Patient taking differently: Take 1 mg by mouth 3 (three) times daily. Take one tablet (1 mg) by mouth daily at bedtime, may also take one tablet (1 mg) twice during the day as needed for anxiety) 90 tablet 2  . meclizine (ANTIVERT) 25 MG tablet Take 1 tablet (25 mg total) by mouth every 6 (six) hours as needed for dizziness. 60 tablet 0  . Multiple Vitamin (MULTIVITAMIN WITH MINERALS) TABS tablet Take 1 tablet by mouth daily.    [name] oxyCODONE-acetaminophen (PERCOCET/ROXICET) 5-325 MG tablet Take 1 tablet by mouth every 6 (six) hours as needed for severe pain. (Patient taking differently: Take 1 tablet by mouth every 8 (eight) hours as needed for severe pain.) 30 tablet 0  . predniSONE (DELTASONE) 10 MG tablet Take 1 tablet (10 mg total) by mouth daily with breakfast. (Patient not taking: Reported on 09/16/2020) 30 tablet 1  . prochlorperazine  (COMPAZINE) 10 MG tablet Take 1 tablet (10 mg total) by mouth every 6 (six) hours as needed. (Patient taking differently: Take 10 mg by mouth every 6 (six) hours as needed for nausea or vomiting.) 30 tablet 2  . Brimonidine Tartrate (LUMIFY) 0.025 % SOLN Place 1 drop into both eyes 2 (two) times a week. As needed     No current facility-administered medications for this encounter.   Facility-Administered Medications Ordered in Other Encounters  Medication Dose Route Frequency Provider Last Rate Last Admin  . 0.9 %  sodium chloride infusion   Intravenous Continuous Curt Bears, MD   Stopped at 03/05/20 1410    REVIEW OF SYSTEMS:  A 10+ POINT REVIEW OF SYSTEMS  WAS OBTAINED including neurology, dermatology, psychiatry, cardiac, respiratory, lymph, extremities, GI, GU, musculoskeletal, constitutional, reproductive, HEENT.  The patient reports pain in the left anterior rib cage area there is breast tissue.  There is developed approximately 2 weeks ago.  Patient also complains of pain in the mid back region with radiation of this pain into the left and right flank area.  He denies any other areas of pain.  He describes the pain as intense. PHYSICAL EXAM:  height is 5' 10" (1.778 m) and weight is 191 lb (86.6 kg). His temperature is 98 F (36.7 C). His blood pressure is 142/59 (abnormal) and his pulse is 99. His respiration is 18 and oxygen saturation is 100%.   General: Alert and oriented, in no acute distress HEENT: Head is normocephalic. Extraocular movements are intact. Neck: Neck is supple, no palpable cervical or supraclavicular lymphadenopathy. Heart: Regular in rate and rhythm with no murmurs, rubs, or gallops. Chest:  Some wheezing noted along the right side of the lung.  Decreased breath sounds slightly in this region too Abdomen: Soft, nontender, nondistended, with no rigidity or guarding. Extremities: No cyanosis or edema.  Lymphatics: see Neck Exam Skin: No concerning  lesions. Musculoskeletal: symmetric strength and muscle tone throughout.  Limited mobility of his right shoulder secondary to being diagnosed with "frozen shoulder" Neurologic: Cranial nerves II through XII are grossly intact. No obvious focalities. Speech is fluent. Coordination is intact. Psychiatric: Judgment and insight are intact. Affect is appropriate.     ECOG = 1  0 - Asymptomatic (Fully active, able to carry on all predisease activities without restriction)  1 - Symptomatic but completely ambulatory (Restricted in physically strenuous activity but ambulatory and able to carry out work of a light or sedentary nature. For example, light housework, office work)  2 - Symptomatic, <50% in bed during the day (Ambulatory and capable of all self care but unable to carry out any work activities. Up and about more than 50% of waking hours)  3 - Symptomatic, >50% in bed, but not bedbound (Capable of only limited self-care, confined to bed or chair 50% or more of waking hours)  4 - Bedbound (Completely disabled. Cannot carry on any self-care. Totally confined to bed or chair)  5 - Death   Eustace Pen MM, Creech RH, Tormey DC, et al. [phone]). "Toxicity and response criteria of the [facility]". [place] Oncol. 5 (6): 649-55  LABORATORY DATA:  Lab Results  Component Value Date   WBC 7.2 09/14/2020   HGB 11.9 (L) 09/14/2020   HCT 36.9 (L) 09/14/2020   MCV 84.4 09/14/2020   PLT 239 09/14/2020   NEUTROABS 4.7 09/14/2020   Lab Results  Component Value Date   NA 141 09/14/2020   K 3.5 09/14/2020   CL 106 09/14/2020   CO2 27 09/14/2020   GLUCOSE 85 09/14/2020   CREATININE 0.95 09/14/2020   CALCIUM 9.9 09/14/2020      RADIOGRAPHY: NM PET Image Restag (PS) Skull Base To Thigh  Result Date: 09/11/2020 CLINICAL DATA:  Subsequent treatment strategy for non-small-cell lung cancer diagnosed last September. Radiation therapy and chemotherapy complete. EXAM: NUCLEAR  MEDICINE PET SKULL BASE TO THIGH TECHNIQUE: mCi F-18 FDG was injected intravenously. Full-ring PET imaging was performed from the skull base to thigh after the radiotracer. CT data was obtained and used for attenuation correction and anatomic localization. Fasting blood glucose:  mg/dl COMPARISON:  Chest CT of 07/20/2020. Abdominal MRI of 06/27/2020. Most recent PET of  01/16/2020. FINDINGS: Mediastinal blood pool activity: SUV max 2.3 Liver activity: SUV max NA NECK: No cervical nodal hypermetabolism. Lower cervical left-sided scalene muscular hypermetabolism is likely due to motion after radiopharmaceutical. Incidental CT findings: No cervical adenopathy. CHEST: Circumferential right apical pleural thickening with low-level hypermetabolism is is suspicious but may be partially treatment related. However, new areas of extensive right-sided pleural irregularity and hypermetabolism more inferiorly are consistent with metastatic disease. Example at 7 mm and a S.U.V. max of 7.2 on 103/4. Hypermetabolism along the right cardiac border is favored to be due to medial pleural disease including at a S.U.V. max of 11.2 on image 100/4. Right paratracheal node measures 6 mm and a S.U.V. max of 5.0 on 75/4. Probable left-sided pleural disease is evidenced by hypermetabolism which is favored to be pleural rather than arising from the adjacent rib. Example at a S.U.V. max of 5.3 on 85/4. Incidental CT findings: Aortic atherosclerosis. Moderate centrilobular emphysema. Consolidation and cavitation in the right apex at the site of treated tumor. Index left lower lobe pulmonary nodule of 7 mm on 43/8 measured 6 mm on 07/20/2020. ABDOMEN/PELVIS: New metastasis within the high left hepatic lobe at 1.4 cm and a S.U.V. max of 12.0 on 118/4 . The previously detailed inter/upper pole right renal mass is hypermetabolic, on the order of 3.3 cm and a S.U.V. max of 13.0 on 139/4. Retrocaval node measures 7 mm and a S.U.V. max of 8.8 on 141/4.  Incidental CT findings: Abdominal aortic atherosclerosis. Mild prostatomegaly. SKELETON: Widespread osseous metastasis. Index T12 lesion measures a S.U.V. max of 16.7. A right acetabular lesion is relatively CT occult at a S.U.V. max of 9.7 on 195/4. T11 spinous process lesion measures a S.U.V. max of 8.9. Metastasis involving the left C1 arch is relatively CT occult at a S.U.V. max of 7.7 on 28/4. Incidental CT findings: none IMPRESSION: 1. Widespread hypermetabolic metastatic disease, including to the right pleural space, liver, thoracoabdominal nodes, bones. Probable left-sided pleural metastasis as well. 2. Pulmonary nodules which are below PET resolution. Suspect mild enlargement of a left lower lobe nodule, also suspicious for metastatic disease. 3. Aortic atherosclerosis (ICD10-I70.0) and emphysema (ICD10-J43.9). Electronically Signed   By: Abigail Miyamoto M.D.   On: 09/11/2020 09:15   CT RENAL BIOPSY  Result Date: 08/26/2020 INDICATION: 60 year old male with an irregular mass in the interpolar right kidney. Patient has a history of pulmonary adenocarcinoma. Findings are concerning for urothelial carcinoma versus metastatic lesion to the kidney. Patient presents for image guided core biopsy EXAM: CT BIOPSY CORE RENAL MEDICATIONS: None. ANESTHESIA/SEDATION: Moderate (conscious) sedation was employed during this procedure. A total of Versed 1 mg and Fentanyl 50 mcg was administered intravenously. Moderate Sedation Time: 15 minutes. The patient's level of consciousness and vital signs were monitored continuously by radiology nursing throughout the procedure under my direct supervision. FLUOROSCOPY TIME:  None COMPLICATIONS: None immediate. PROCEDURE: Informed written consent was obtained from the patient after a thorough discussion of the procedural risks, benefits and alternatives. All questions were addressed. Maximal Sterile Barrier Technique was utilized including caps, mask, sterile gowns, sterile gloves,  sterile drape, hand hygiene and skin antiseptic. A timeout was performed prior to the initiation of the procedure. Initial CT imaging was performed. The mass is nearly occult by CT. Therefore, ultrasound was performed while the patient was on the CT gantry. Ultrasound demonstrates the irregular mass in the central aspect of the kidney. A suitable skin entry site was selected and marked. The skin was sterilely prepped and  draped in the standard fashion using chlorhexidine skin prep. Local anesthesia was attained by infiltration with 1% lidocaine. A small dermatotomy was made. Under real-time ultrasound guidance, an 18 gauge trocar needle was advanced into the margin of the mass. Multiple 18 gauge core biopsies were then obtained coaxially using the bio Pince automated biopsy device. Biopsy specimens were placed in formalin and delivered to pathology for further analysis. Repeat CT imaging was performed demonstrating a very small amount of perinephric hemorrhage. This is not unexpected. Patient remained clinically asymptomatic. IMPRESSION: Technically successful combined ultrasound and CT guided biopsy of right renal mass. Electronically Signed   By: Jacqulynn Cadet M.D.   On: 08/26/2020 12:21      IMPRESSION: Stage IIB (T3, N0, M0) non-small cell lung cancer, adenocarcinoma presented with large central perihilar mass with suspicious groundglass opacity in the right middle lobe and left upper lobe diagnosed in September 2021.  The patient is quite symptomatic from his osseous metastasis in the 2 areas mentioned above.  He would be a good candidate for short course of palliative radiation therapy.  PLAN: Patient will undergo CT simulation on May 27 with treatments to begin early next week.  Anticipate 10 treatments to both areas.  Patient has met with Dr.Mohamed and is decided to proceed with systemic therapy for management of his recurrence ------------------------------------------------  Blair Promise,  PhD, MD  This document serves as a record of services personally performed by Gery Pray, MD. It was created on his behalf by Roney Mans, a trained medical scribe. The creation of this record is based on the scribe's personal observations and the provider's statements to them. This document has been checked and approved by the attending provider.

## 2020-09-16 ENCOUNTER — Ambulatory Visit
Admission: RE | Admit: 2020-09-16 | Discharge: 2020-09-16 | Disposition: A | Discharge status: Home / Self Care | Payer: BC Managed Care – PPO | Source: Ambulatory Visit | Attending: Radiation Oncology | Admitting: Radiation Oncology

## 2020-09-16 ENCOUNTER — Other Ambulatory Visit: Payer: Self-pay

## 2020-09-16 ENCOUNTER — Other Ambulatory Visit: Payer: Self-pay | Admitting: Radiology

## 2020-09-16 ENCOUNTER — Encounter: Payer: Self-pay | Admitting: Radiation Oncology

## 2020-09-16 ENCOUNTER — Encounter: Payer: Self-pay | Admitting: *Deleted

## 2020-09-16 VITALS — BP 142/59 | HR 99 | Temp 98.0°F | Resp 18 | Ht 70.0 in | Wt 191.0 lb

## 2020-09-16 DIAGNOSIS — C7951 Secondary malignant neoplasm of bone: Secondary | ICD-10-CM | POA: Insufficient documentation

## 2020-09-16 DIAGNOSIS — C3412 Malignant neoplasm of upper lobe, left bronchus or lung: Secondary | ICD-10-CM | POA: Diagnosis not present

## 2020-09-16 DIAGNOSIS — R918 Other nonspecific abnormal finding of lung field: Secondary | ICD-10-CM

## 2020-09-16 DIAGNOSIS — R911 Solitary pulmonary nodule: Secondary | ICD-10-CM

## 2020-09-16 DIAGNOSIS — C3491 Malignant neoplasm of unspecified part of right bronchus or lung: Secondary | ICD-10-CM

## 2020-09-16 HISTORY — DX: Personal history of irradiation: Z92.3

## 2020-09-16 NOTE — Progress Notes (Signed)
See MD note for nursing evaluation. °

## 2020-09-16 NOTE — Progress Notes (Signed)
[place] Psychosocial Distress Screening Clinical Social Work  Clinical Social Work was referred by distress screening protocol.  The patient scored a 5 on the Psychosocial Distress Thermometer which indicates moderate distress. Clinical Social Worker contacted patient by phone to assess for distress and other psychosocial needs.  Patient stated he was doing "ok", and doing "his best" to manage all of his appointments.  CSW provided education on the support team and support programs at [facility].  Patient stated he was currently receiving long term disability through his employer and has applied for [place] through the lawyer with his LTD policy (approximately 2 months ago).  CSW and patient discussed the [place] and patient plans to come to the patient and family support center to enroll in the grant before his appointment on 09/18/20.  CSW provided contact information and encouraged patient to call with questions or concerns.         ON[facility]N DISTRESS SCREENING 09/16/2020  Screening Type Initial Screening  Distress experienced in past week (1-10) 5  Practical problem type Insurance  Emotional problem type Nervousness/Anxiety;Adjusting to illness  Physical Problem type Pain;Other (comment);Changes in urination  Referral to clinical social work   Other     Johnnye Lana, MSW, [place], OSW-C Clinical Social Worker [employer] [phone]

## 2020-09-17 ENCOUNTER — Other Ambulatory Visit: Payer: Self-pay | Admitting: Radiology

## 2020-09-17 ENCOUNTER — Telehealth: Payer: Self-pay | Admitting: Physician Assistant

## 2020-09-17 NOTE — Telephone Encounter (Signed)
Scheduled per los. Called and spoke with patient. Advised 6/1 appt will be scheduled once approved and may be in [facility]. Confirmed appts

## 2020-09-18 ENCOUNTER — Other Ambulatory Visit: Payer: Self-pay | Admitting: Physician Assistant

## 2020-09-18 ENCOUNTER — Other Ambulatory Visit: Payer: Self-pay

## 2020-09-18 ENCOUNTER — Ambulatory Visit (HOSPITAL_COMMUNITY)
Admission: RE | Admit: 2020-09-18 | Discharge: 2020-09-18 | Disposition: A | Discharge status: Home / Self Care | Payer: BC Managed Care – PPO | Source: Ambulatory Visit | Attending: Physician Assistant | Admitting: Physician Assistant

## 2020-09-18 ENCOUNTER — Ambulatory Visit
Admission: RE | Admit: 2020-09-18 | Discharge: 2020-09-18 | Disposition: A | Discharge status: Home / Self Care | Payer: BC Managed Care – PPO | Source: Ambulatory Visit | Attending: Radiation Oncology | Admitting: Radiation Oncology

## 2020-09-18 DIAGNOSIS — J449 Chronic obstructive pulmonary disease, unspecified: Secondary | ICD-10-CM | POA: Insufficient documentation

## 2020-09-18 DIAGNOSIS — I878 Other specified disorders of veins: Secondary | ICD-10-CM

## 2020-09-18 DIAGNOSIS — Z510 Encounter for antineoplastic radiation therapy: Secondary | ICD-10-CM | POA: Insufficient documentation

## 2020-09-18 DIAGNOSIS — C3411 Malignant neoplasm of upper lobe, right bronchus or lung: Secondary | ICD-10-CM | POA: Insufficient documentation

## 2020-09-18 DIAGNOSIS — Z79899 Other long term (current) drug therapy: Secondary | ICD-10-CM | POA: Insufficient documentation

## 2020-09-18 DIAGNOSIS — I10 Essential (primary) hypertension: Secondary | ICD-10-CM | POA: Insufficient documentation

## 2020-09-18 DIAGNOSIS — Z881 Allergy status to other antibiotic agents status: Secondary | ICD-10-CM | POA: Insufficient documentation

## 2020-09-18 DIAGNOSIS — C3491 Malignant neoplasm of unspecified part of right bronchus or lung: Secondary | ICD-10-CM

## 2020-09-18 DIAGNOSIS — F419 Anxiety disorder, unspecified: Secondary | ICD-10-CM | POA: Diagnosis not present

## 2020-09-18 DIAGNOSIS — C3490 Malignant neoplasm of unspecified part of unspecified bronchus or lung: Secondary | ICD-10-CM | POA: Insufficient documentation

## 2020-09-18 DIAGNOSIS — Z87891 Personal history of nicotine dependence: Secondary | ICD-10-CM | POA: Insufficient documentation

## 2020-09-18 HISTORY — PX: IR IMAGING GUIDED PORT INSERTION: IMG5740

## 2020-09-18 IMAGING — XA IR IMAGING GUIDED PORT INSERTION
2 series · 3 of 3 positions shown · non-contrast
Comparison: None.

INDICATION: 59-year-old male with history of non-small cell lung cancer
requiring central venous access for chemotherapy.

EXAM:
IMPLANTED PORT A CATH PLACEMENT WITH ULTRASOUND AND FLUOROSCOPIC
GUIDANCE

[Series 1: ir imaging guided port insertion · 2 of 2 slices shown]
[im 1/2]
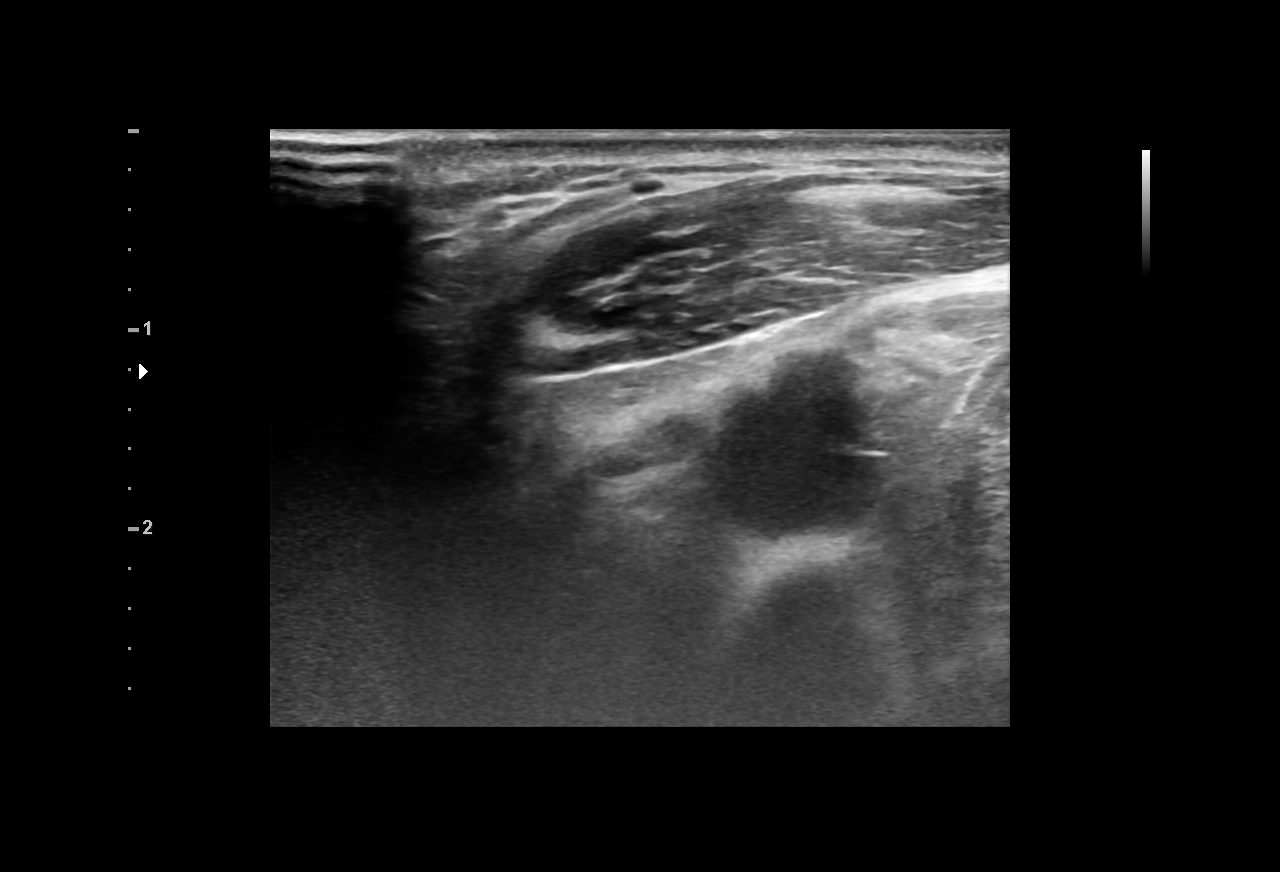
[im 2/2]
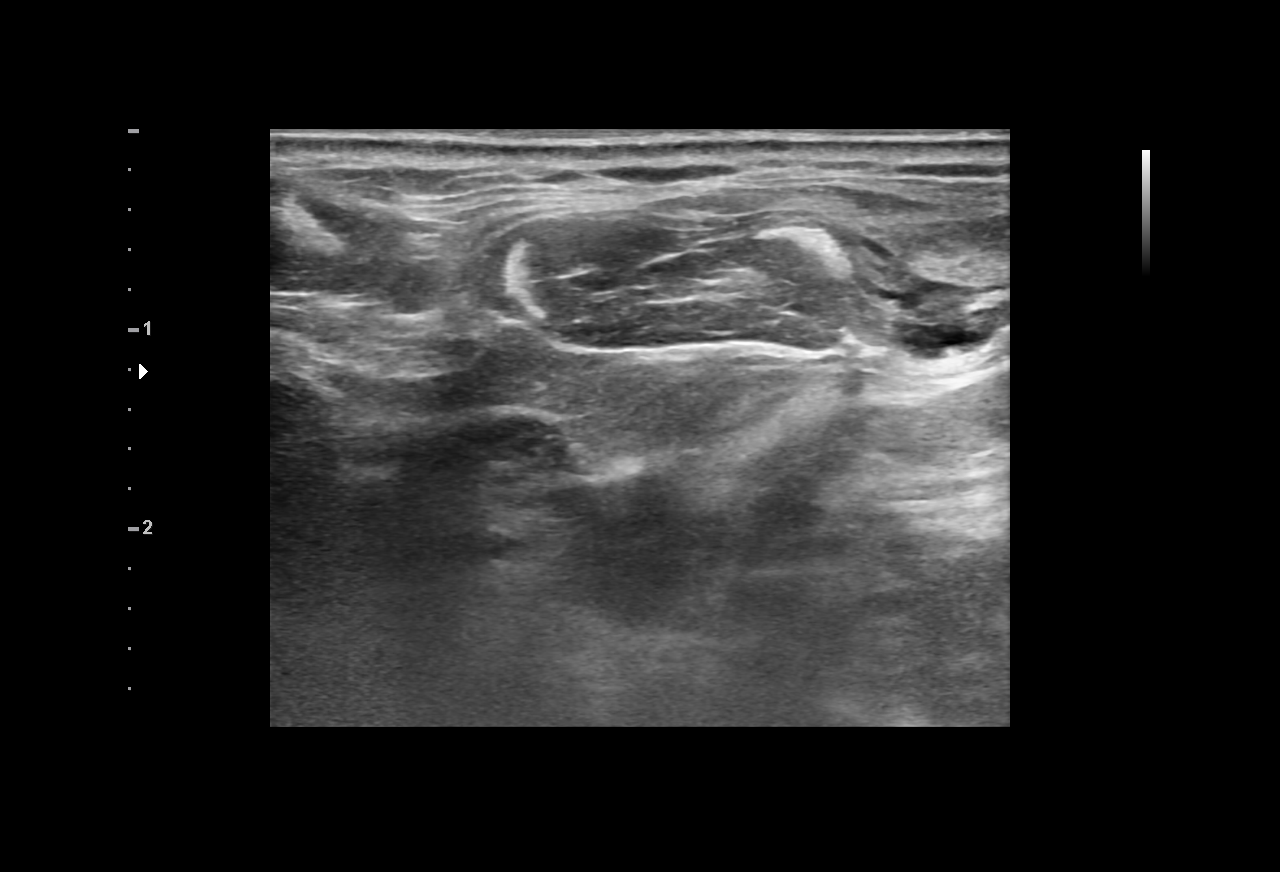

[Series 1: single · 1 of 1 slices shown]
[im 1/1]
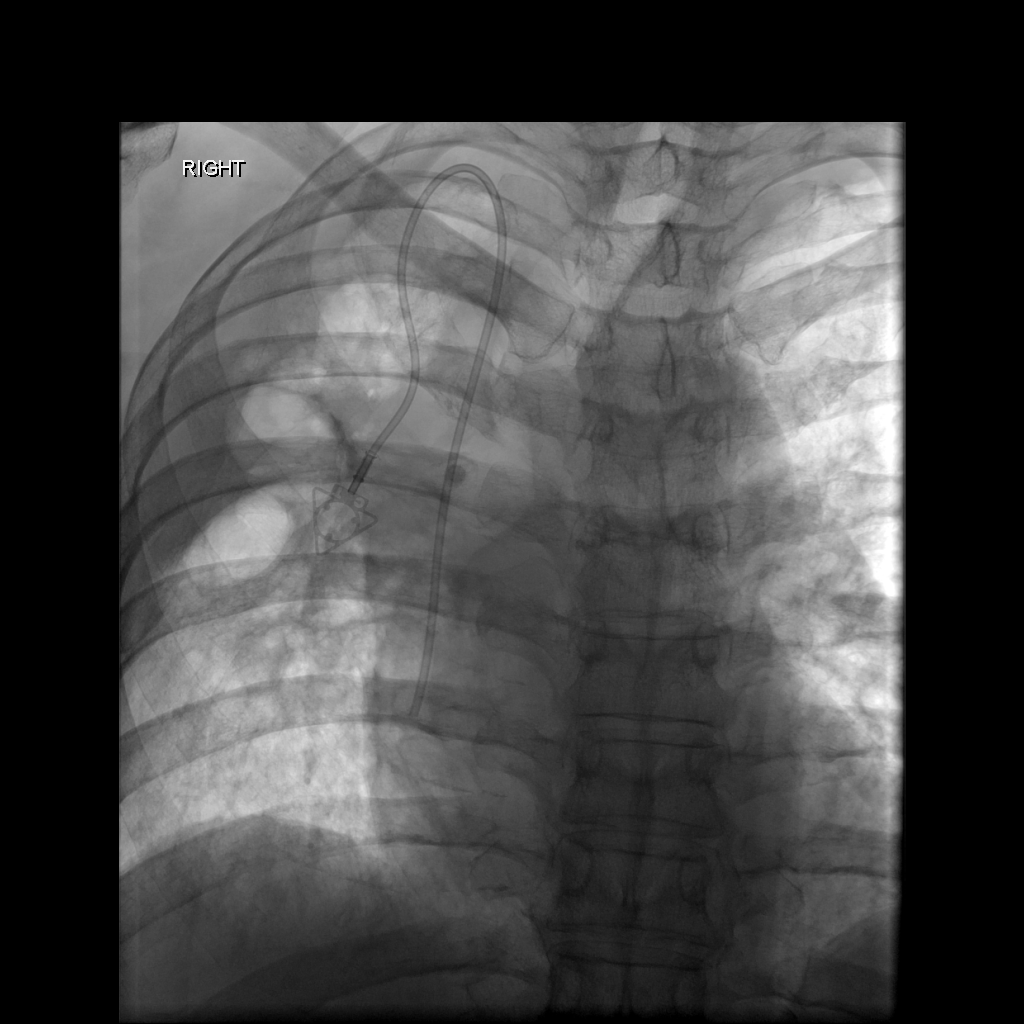

[3 of 3 positions shown; findings below may reference images not displayed]

MEDICATIONS:
None.

ANESTHESIA/SEDATION:
Moderate (conscious) sedation was employed during this procedure. A
total of Versed 2 mg and Fentanyl 100 mcg was administered
intravenously.

Moderate Sedation Time: 16 minutes. The patient's level of
consciousness and vital signs were monitored continuously by
radiology nursing throughout the procedure under my direct
supervision.

CONTRAST:  None

FLUOROSCOPY TIME:  0 minutes, 24 seconds (2 mGy)

COMPLICATIONS:
None immediate.

PROCEDURE:
The procedure, risks, benefits, and alternatives were explained to
the patient. Questions regarding the procedure were encouraged and
answered. The patient understands and consents to the procedure.

The right neck and chest were prepped with chlorhexidine in a
sterile fashion, and a sterile drape was applied covering the
operative field. Maximum barrier sterile technique with sterile
gowns and gloves were used for the procedure. A timeout was
performed prior to the initiation of the procedure.

Ultrasound was used to examine the jugular vein which was
compressible and free of internal echoes. A skin marker was used to
demarcate the planned venotomy and port pocket incision sites. Local
anesthesia was provided to these sites and the subcutaneous tunnel
track with 1% lidocaine with [DATE] epinephrine.

A small incision was created at the jugular access site and blunt
dissection was performed of the subcutaneous tissues. Under
ultrasound guidance, the jugular vein was accessed with a 21 ga
micropuncture needle and an 0.018" wire was inserted to the superior
vena cava. Real-time ultrasound guidance was utilized for vascular
access including the acquisition of a permanent ultrasound image
documenting patency of the accessed vessel. A 5 Fr micopuncture set
was then used, through which a 0.035" Rosen wire was passed under
fluoroscopic guidance into the inferior vena cava. An 8 Fr dilator
was then placed over the wire.

A subcutaneous port pocket was then created along the upper chest
wall utilizing a combination of sharp and blunt dissection. The
pocket was irrigated with sterile saline, packed with gauze, and
observed for hemorrhage. A single lumen "ISP" sized power injectable
port was chosen for placement. The 8 Fr catheter was tunneled from
the port pocket site to the venotomy incision. The port was placed
in the pocket. The external catheter was trimmed to appropriate
length. The dilator was exchanged for an 8 Fr peel-away sheath under
fluoroscopic guidance. The catheter was then placed through the
sheath and the sheath was removed. Final catheter positioning was
confirmed and documented with a fluoroscopic spot radiograph. The
port was accessed with MARCO ZARATTINI needle, aspirated, and flushed with
heparinized saline.

The deep dermal layer of the port pocket incision was closed with
interrupted 3-0 Vicryl suture. Dermabond was then placed over the
port pocket and neck incisions. The patient tolerated the procedure
well without immediate post procedural complication.
FINDINGS: After catheter placement, the tip lies within the superior
cavoatrial junction. The catheter aspirates and flushes normally and
is ready for immediate use.
IMPRESSION: Successful placement of a power injectable Port-A-Cath via the right
internal jugular vein. The catheter is ready for immediate use.

## 2020-09-18 MED ORDER — SODIUM CHLORIDE 0.9 % IV SOLN: INTRAVENOUS | Status: DC

## 2020-09-18 MED ORDER — LIDOCAINE-EPINEPHRINE 1 %-1:100000 IJ SOLN
INTRAMUSCULAR | Status: AC
  Filled 2020-09-18: qty 1

## 2020-09-18 MED ORDER — HEPARIN SOD (PORK) LOCK FLUSH 100 UNIT/ML IV SOLN
INTRAVENOUS | Status: AC
  Administered 2020-09-18: 500 [IU]
  Filled 2020-09-18: qty 5

## 2020-09-18 MED ORDER — MIDAZOLAM HCL 2 MG/2ML IJ SOLN
INTRAMUSCULAR | Status: AC
  Filled 2020-09-18: qty 4

## 2020-09-18 MED ORDER — VANCOMYCIN HCL IN DEXTROSE 1-5 GM/200ML-% IV SOLN: 1000.0000 mg | INTRAVENOUS | Status: DC

## 2020-09-18 MED ORDER — FENTANYL CITRATE (PF) 100 MCG/2ML IJ SOLN
INTRAMUSCULAR | Status: AC
  Filled 2020-09-18: qty 4

## 2020-09-18 MED ORDER — FENTANYL CITRATE (PF) 100 MCG/2ML IJ SOLN
INTRAMUSCULAR | Status: AC | PRN
  Administered 2020-09-18 (×2): 50 ug via INTRAVENOUS

## 2020-09-18 MED ORDER — MIDAZOLAM HCL 2 MG/2ML IJ SOLN
INTRAMUSCULAR | Status: AC | PRN
  Administered 2020-09-18 (×2): 1 mg via INTRAVENOUS

## 2020-09-18 NOTE — Discharge Instructions (Addendum)
Implanted Port Insertion, Care After This sheet gives you information about how to care for yourself after your procedure. Your health care provider may also give you more specific instructions. If you have problems or questions, contact your health care provider. What can I expect after the procedure? After the procedure, it is common to have:  Discomfort at the port insertion site.  Bruising on the skin over the port. This should improve over 3-4 days. Follow these instructions at home: [facility] care  After your port is placed, you will get a manufacturer's information card. The card has information about your port. Keep this card with you at all times.  Take care of the port as told by your health care provider. Ask your health care provider if you or a family member can get training for taking care of the port at home. A home health care nurse may also take care of the port.  Make sure to remember what type of port you have. Incision care  Follow instructions from your health care provider about how to take care of your port insertion site. Make sure you: ? Wash your hands with soap and water before and after you change your bandage (dressing). If soap and water are not available, use hand sanitizer. ? Change your dressing as told by your health care provider. ? Leave stitches (sutures), skin glue, or adhesive strips in place. These skin closures may need to stay in place for 2 weeks or longer. If adhesive strip edges start to loosen and curl up, you may trim the loose edges. Do not remove adhesive strips completely unless your health care provider tells you to do that.  Check your port insertion site every day for signs of infection. Check for: ? Redness, swelling, or pain. ? Fluid or blood. ? Warmth. ? Pus or a bad smell.      Activity  Return to your normal activities as told by your health care provider. Ask your health care provider what activities are safe for you.  Do not  lift anything that is heavier than 10 lb (4.5 kg), or the limit that you are told, until your health care provider says that it is safe. General instructions  Take over-the-counter and prescription medicines only as told by your health care provider.  Do not take baths, swim, or use a hot tub until your health care provider approves. Ask your health care provider if you may take showers. You may only be allowed to take sponge baths.  Do not drive for 24 hours if you were given a sedative during your procedure.  Wear a medical alert bracelet in case of an emergency. This will tell any health care providers that you have a port.  Keep all follow-up visits as told by your health care provider. This is important. Contact a health care provider if:  You cannot flush your port with saline as directed, or you cannot draw blood from the port.  You have a fever or chills.  You have redness, swelling, or pain around your port insertion site.  You have fluid or blood coming from your port insertion site.  Your port insertion site feels warm to the touch.  You have pus or a bad smell coming from the port insertion site. Get help right away if:  You have chest pain or shortness of breath.  You have bleeding from your port that you cannot control. Summary  Take care of the port as told by your  health care provider. Keep the manufacturer's information card with you at all times.  Change your dressing as told by your health care provider.  Contact a health care provider if you have a fever or chills or if you have redness, swelling, or pain around your port insertion site.  Keep all follow-up visits as told by your health care provider. This information is not intended to replace advice given to you by your health care provider. Make sure you discuss any questions you have with your health care provider. Document Revised: 11/07/2017 Document Reviewed: 11/07/2017 Elsevier Patient Education   2021 [place].  Moderate Conscious Sedation, Adult, Care After This sheet gives you information about how to care for yourself after your procedure. Your health care provider may also give you more specific instructions. If you have problems or questions, contact your health care provider. What can I expect after the procedure? After the procedure, it is common to have:  Sleepiness for several hours.  Impaired judgment for several hours.  Difficulty with balance.  Vomiting if you eat too soon. Follow these instructions at home: For the time period you were told by your health care provider: 24 hours  Rest.  Do not participate in activities where you could fall or become injured.  Do not drive or use machinery.  Do not drink alcohol.  Do not take sleeping pills or medicines that cause drowsiness.  Do not make important decisions or sign legal documents.  Do not take care of children on your own.      Eating and drinking  Follow the diet recommended by your health care provider.  Drink enough fluid to keep your urine pale yellow.  If you vomit: ? Drink water, juice, or soup when you can drink without vomiting. ? Make sure you have little or no nausea before eating solid foods.   General instructions  Take over-the-counter and prescription medicines only as told by your health care provider.  Have a responsible adult stay with you for the time you are told. It is important to have someone help care for you until you are awake and alert.  Do not smoke.  Keep all follow-up visits as told by your health care provider. This is important. Contact a health care provider if:  You are still sleepy or having trouble with balance after 24 hours.  You feel light-headed.  You keep feeling nauseous or you keep vomiting.  You develop a rash.  You have a fever.  You have redness or swelling around the IV site. Get help right away if:  You have trouble  breathing.  You have new-onset confusion at home. Summary  After the procedure, it is common to feel sleepy, have impaired judgment, or feel nauseous if you eat too soon.  Rest after you get home. Know the things you should not do after the procedure.  Follow the diet recommended by your health care provider and drink enough fluid to keep your urine pale yellow.  Get help right away if you have trouble breathing or new-onset confusion at home. This information is not intended to replace advice given to you by your health care provider. Make sure you discuss any questions you have with your health care provider. Document Revised: 08/09/2019 Document Reviewed: 03/07/2019 Elsevier Patient Education  2021 [employer].

## 2020-09-18 NOTE — Procedures (Signed)
Interventional Radiology Procedure Note  Procedure: Port Placement  Findings: Please refer to procedural dictation for full description. Right IJ single lumen port, 27 cm. Tip at cavoatrial junction.  Complications: None immediate  Estimated Blood Loss: < 5 mL  Recommendations: Port ready for immediate use.   Ruthann Cancer, MD

## 2020-09-18 NOTE — H&P (Signed)
Chief Complaint: Patient was seen in consultation today for port placement.  Referring Physician(s): Heilingoetter,Cassandra L  Supervising Physician: Ruthann Cancer  Patient Status: [facility] - Out-pt  History of Present Illness: Richard Li is a 60 y.o. male with a past medical history significant for anxiety, HTN, COPD and recently diagnosed metastatic non-small cell lung cancer who presents today for port placement. Richard Li was first diagnosed with non-small cell lung cancer in September of 2021 and began chemoradiation in October. He is currently planned to undergo further palliative systemic chemotherapy and IR has been asked to place a port for durable venous access.   Past Medical History:  Diagnosis Date  . Allergic rhinitis, cause unspecified   . Anxiety state, unspecified   . Asthma    as a child  . Chronic airway obstruction, not elsewhere classified   . Esophageal reflux   . History of kidney stones   . History of radiation therapy 02/13/2020-03/24/2020   right lung        Dr Gery Pray  . Hypertension   . Lumbago   . lung ca dx'd 12/2019  . Other and unspecified hyperlipidemia   . Other chest pain     Past Surgical History:  Procedure Laterality Date  . APPENDECTOMY    . BRONCHIAL BIOPSY  01/07/2020   Procedure: BRONCHIAL BIOPSIES;  Surgeon: Collene Gobble, MD;  Location: [facility] ENDOSCOPY;  Service: Pulmonary;;  . BRONCHIAL BIOPSY  04/05/2020   Procedure: BRONCHIAL BIOPSIES;  Surgeon: Rigoberto Noel, MD;  Location: [place];  Service: Cardiopulmonary;;  . BRONCHIAL BRUSHINGS  01/07/2020   Procedure: BRONCHIAL BRUSHINGS;  Surgeon: Collene Gobble, MD;  Location: [facility] ENDOSCOPY;  Service: Pulmonary;;  . BRONCHIAL NEEDLE ASPIRATION BIOPSY  01/07/2020   Procedure: BRONCHIAL NEEDLE ASPIRATION BIOPSIES;  Surgeon: Collene Gobble, MD;  Location: [facility] ENDOSCOPY;  Service: Pulmonary;;  . BRONCHIAL WASHINGS  01/07/2020   Procedure: BRONCHIAL WASHINGS;  Surgeon: Collene Gobble, MD;  Location: [facility] ENDOSCOPY;  Service: Pulmonary;;  . BRONCHIAL WASHINGS  04/05/2020   Procedure: BRONCHIAL WASHINGS;  Surgeon: Rigoberto Noel, MD;  Location: [place];  Service: Cardiopulmonary;;  . COLONOSCOPY  15 years ago  . HEMOSTASIS CONTROL  01/07/2020   Procedure: HEMOSTASIS CONTROL;  Surgeon: Collene Gobble, MD;  Location: [facility] ENDOSCOPY;  Service: Pulmonary;;  cold saline  . NASAL TURBINATE REDUCTION  2002   Dr.Crossley  . VASECTOMY    . VIDEO BRONCHOSCOPY Right 04/05/2020   Procedure: VIDEO BRONCHOSCOPY WITH FLUORO;  Surgeon: Rigoberto Noel, MD;  Location: [place];  Service: Cardiopulmonary;  Laterality: Right;  [name] VIDEO BRONCHOSCOPY WITH ENDOBRONCHIAL NAVIGATION N/A 01/07/2020   Procedure: VIDEO BRONCHOSCOPY WITH ENDOBRONCHIAL NAVIGATION;  Surgeon: Collene Gobble, MD;  Location: [place] ENDOSCOPY;  Service: Pulmonary;  Laterality: N/A;  . VIDEO BRONCHOSCOPY WITH ENDOBRONCHIAL ULTRASOUND N/A 01/07/2020   Procedure: VIDEO BRONCHOSCOPY WITH ENDOBRONCHIAL ULTRASOUND;  Surgeon: Collene Gobble, MD;  Location: [place] ENDOSCOPY;  Service: Pulmonary;  Laterality: N/A;    Allergies: Amoxicillin  Medications: Prior to Admission medications   Medication Sig Start Date End Date Taking? Authorizing Provider  acetaminophen (TYLENOL) 500 MG tablet Take 1,000 mg by mouth 2 (two) times daily.   Yes [provider]  Brimonidine Tartrate (LUMIFY) 0.025 % SOLN Place 1 drop into both eyes 2 (two) times a week. As needed   Yes [provider]  dextromethorphan-guaiFENesin (MUCINEX DM) 30-600 MG 12hr tablet Take 1 tablet by mouth 2 (two) times daily.  Yes [provider]  doxycycline (VIBRA-TABS) 100 MG tablet Take 1 tablet (100 mg total) by mouth 2 (two) times daily. 08/28/20  Yes Hunsucker, Bonna Gains, MD  feeding supplement (ENSURE ENLIVE / ENSURE PLUS) LIQD Take 237 mLs by mouth 3 (three) times daily between meals. Patient taking differently: Take 237 mLs by mouth  2 (two) times daily between meals. 05/11/20  Yes Sheikh, Omair Latif, DO  fenofibrate 160 MG tablet Take 1 tablet by mouth once daily Patient taking differently: Take 160 mg by mouth daily. 05/18/20  Yes [name], DO  ferrous sulfate 325 (65 FE) MG tablet Take 1 tablet (325 mg total) by mouth daily with breakfast. 05/12/20  Yes Sheikh, Omair Latif, DO  folic acid (FOLVITE) 1 MG tablet Take 1 tablet (1 mg total) by mouth daily. 09/14/20  Yes Heilingoetter, Cassandra L, PA-C  Ipratropium-Albuterol (COMBIVENT RESPIMAT) 20-100 MCG/ACT AERS respimat Inhale 1-2 puffs into the lungs every 6 (six) hours as needed for wheezing. 07/27/20  Yes Hunsucker, Bonna Gains, MD  LORazepam (ATIVAN) 1 MG tablet Take 1 tablet (1 mg total) by mouth See admin instructions. Take one tablet (1 mg) by mouth daily at bedtime, may also take one tablet (1 mg) twice during the day as needed for anxiety Patient taking differently: Take 1 mg by mouth 3 (three) times daily. Take one tablet (1 mg) by mouth daily at bedtime, may also take one tablet (1 mg) twice during the day as needed for anxiety 08/12/20  Yes [name], DO  meclizine (ANTIVERT) 25 MG tablet Take 1 tablet (25 mg total) by mouth every 6 (six) hours as needed for dizziness. 06/22/20  Yes [name], DO  Multiple Vitamin (MULTIVITAMIN WITH MINERALS) TABS tablet Take 1 tablet by mouth daily. 03/18/20  Yes Mercy Riding, MD  oxyCODONE-acetaminophen (PERCOCET/ROXICET) 5-325 MG tablet Take 1 tablet by mouth every 6 (six) hours as needed for severe pain. Patient taking differently: Take 1 tablet by mouth every 8 (eight) hours as needed for severe pain. 09/14/20  Yes Heilingoetter, Cassandra L, PA-C  prochlorperazine (COMPAZINE) 10 MG tablet Take 1 tablet (10 mg total) by mouth every 6 (six) hours as needed. Patient taking differently: Take 10 mg by mouth every 6 (six) hours as needed for nausea or vomiting. 09/14/20  Yes Heilingoetter, Cassandra L, PA-C  predniSONE  (DELTASONE) 10 MG tablet Take 1 tablet (10 mg total) by mouth daily with breakfast. Patient not taking: Reported on 09/16/2020 08/04/20   Hunsucker, Bonna Gains, MD  potassium chloride SA (KLOR-CON) 20 MEQ tablet Take 2 tablets (40 mEq total) by mouth daily. 04/22/20 05/08/20  Curt Bears, MD  sucralfate (CARAFATE) 1 g tablet Take 1 tablet (1 g total) by mouth 4 (four) times daily -  with meals and at bedtime. Patient not taking: Reported on 03/05/2020 03/02/20 03/17/20  Curt Bears, MD     Family History  Problem Relation Age of Onset  . Breast cancer Mother   . Cancer Father   . Heart attack Brother   . Emphysema Maternal Uncle   . COPD Maternal Uncle   . Heart disease Maternal Uncle   . Colon cancer Neg Hx     Social History   Socioeconomic History  . Marital status: Married    Spouse name: Designer, industrial/product  . Number of children: Not on file  . Years of education: Not on file  . Highest education level: Not on file  Occupational History  . Occupation: truck Geophysicist/field seismologist  Employer: [employer]  Tobacco Use  . Smoking status: Former Smoker    Packs/day: 2.00    Years: 28.00    Pack years: 56.00    Types: Cigarettes    Quit date: 04/25/2005    Years since quitting: 15.4  . Smokeless tobacco: Never Used  Vaping Use  . Vaping Use: Never used  Substance and Sexual Activity  . Alcohol use: Yes    Alcohol/week: 12.0 standard drinks    Types: 12 Cans of beer per week    Comment: social use  . Drug use: No  . Sexual activity: Not on file  Other Topics Concern  . Not on file  Social History Narrative  . Not on file   Social Determinants of Health   Financial Resource Strain: Not on file  Food Insecurity: Not on file  Transportation Needs: Not on file  Physical Activity: Not on file  Stress: Not on file  Social Connections: Not on file     Review of Systems: A 12 point ROS discussed and pertinent positives are indicated in the HPI above.  All other systems are  negative.  Review of Systems  Constitutional: Negative for chills and fever.  Respiratory: Negative for cough and shortness of breath.   Cardiovascular: Negative for chest pain.  Gastrointestinal: Negative for abdominal pain, nausea and vomiting.  Musculoskeletal: Negative for back pain.  Neurological: Negative for dizziness and headaches.    Vital Signs: BP 119/67   Pulse 93   Temp 98 F (36.7 C) (Oral)   Resp 18   Ht 5\' 10"  (1.778 m)   Wt 180 lb (81.6 kg)   SpO2 98%   BMI 25.83 kg/m   Physical Exam Vitals reviewed.  Constitutional:      General: He is not in acute distress. HENT:     Head: Normocephalic.     Mouth/Throat:     Mouth: Mucous membranes are moist.     Pharynx: Oropharynx is clear. No oropharyngeal exudate or posterior oropharyngeal erythema.  Cardiovascular:     Rate and Rhythm: Normal rate and regular rhythm.  Pulmonary:     Effort: Pulmonary effort is normal.     Breath sounds: Normal breath sounds.  Abdominal:     General: There is no distension.     Palpations: Abdomen is soft.     Tenderness: There is no abdominal tenderness.  Skin:    General: Skin is warm and dry.  Neurological:     Mental Status: He is alert and oriented to person, place, and time.  Psychiatric:        Mood and Affect: Mood normal.        Behavior: Behavior normal.        Thought Content: Thought content normal.        Judgment: Judgment normal.          Imaging: NM PET Image Restag (PS) Skull Base To Thigh  Result Date: 09/11/2020 CLINICAL DATA:  Subsequent treatment strategy for non-small-cell lung cancer diagnosed last September. Radiation therapy and chemotherapy complete. EXAM: NUCLEAR MEDICINE PET SKULL BASE TO THIGH TECHNIQUE: mCi F-18 FDG was injected intravenously. Full-ring PET imaging was performed from the skull base to thigh after the radiotracer. CT data was obtained and used for attenuation correction and anatomic localization. Fasting blood glucose:   mg/dl COMPARISON:  Chest CT of 07/20/2020. Abdominal MRI of 06/27/2020. Most recent PET of 01/16/2020. FINDINGS: Mediastinal blood pool activity: SUV max 2.3 Liver activity: SUV max NA  NECK: No cervical nodal hypermetabolism. Lower cervical left-sided scalene muscular hypermetabolism is likely due to motion after radiopharmaceutical. Incidental CT findings: No cervical adenopathy. CHEST: Circumferential right apical pleural thickening with low-level hypermetabolism is is suspicious but may be partially treatment related. However, new areas of extensive right-sided pleural irregularity and hypermetabolism more inferiorly are consistent with metastatic disease. Example at 7 mm and a S.U.V. max of 7.2 on 103/4. Hypermetabolism along the right cardiac border is favored to be due to medial pleural disease including at a S.U.V. max of 11.2 on image 100/4. Right paratracheal node measures 6 mm and a S.U.V. max of 5.0 on 75/4. Probable left-sided pleural disease is evidenced by hypermetabolism which is favored to be pleural rather than arising from the adjacent rib. Example at a S.U.V. max of 5.3 on 85/4. Incidental CT findings: Aortic atherosclerosis. Moderate centrilobular emphysema. Consolidation and cavitation in the right apex at the site of treated tumor. Index left lower lobe pulmonary nodule of 7 mm on 43/8 measured 6 mm on 07/20/2020. ABDOMEN/PELVIS: New metastasis within the high left hepatic lobe at 1.4 cm and a S.U.V. max of 12.0 on 118/4 . The previously detailed inter/upper pole right renal mass is hypermetabolic, on the order of 3.3 cm and a S.U.V. max of 13.0 on 139/4. Retrocaval node measures 7 mm and a S.U.V. max of 8.8 on 141/4. Incidental CT findings: Abdominal aortic atherosclerosis. Mild prostatomegaly. SKELETON: Widespread osseous metastasis. Index T12 lesion measures a S.U.V. max of 16.7. A right acetabular lesion is relatively CT occult at a S.U.V. max of 9.7 on 195/4. T11 spinous process lesion  measures a S.U.V. max of 8.9. Metastasis involving the left C1 arch is relatively CT occult at a S.U.V. max of 7.7 on 28/4. Incidental CT findings: none IMPRESSION: 1. Widespread hypermetabolic metastatic disease, including to the right pleural space, liver, thoracoabdominal nodes, bones. Probable left-sided pleural metastasis as well. 2. Pulmonary nodules which are below PET resolution. Suspect mild enlargement of a left lower lobe nodule, also suspicious for metastatic disease. 3. Aortic atherosclerosis (ICD10-I70.0) and emphysema (ICD10-J43.9). Electronically Signed   By: Abigail Miyamoto M.D.   On: 09/11/2020 09:15   CT RENAL BIOPSY  Result Date: 08/26/2020 INDICATION: 60 year old male with an irregular mass in the interpolar right kidney. Patient has a history of pulmonary adenocarcinoma. Findings are concerning for urothelial carcinoma versus metastatic lesion to the kidney. Patient presents for image guided core biopsy EXAM: CT BIOPSY CORE RENAL MEDICATIONS: None. ANESTHESIA/SEDATION: Moderate (conscious) sedation was employed during this procedure. A total of Versed 1 mg and Fentanyl 50 mcg was administered intravenously. Moderate Sedation Time: 15 minutes. The patient's level of consciousness and vital signs were monitored continuously by radiology nursing throughout the procedure under my direct supervision. FLUOROSCOPY TIME:  None COMPLICATIONS: None immediate. PROCEDURE: Informed written consent was obtained from the patient after a thorough discussion of the procedural risks, benefits and alternatives. All questions were addressed. Maximal Sterile Barrier Technique was utilized including caps, mask, sterile gowns, sterile gloves, sterile drape, hand hygiene and skin antiseptic. A timeout was performed prior to the initiation of the procedure. Initial CT imaging was performed. The mass is nearly occult by CT. Therefore, ultrasound was performed while the patient was on the CT gantry. Ultrasound  demonstrates the irregular mass in the central aspect of the kidney. A suitable skin entry site was selected and marked. The skin was sterilely prepped and draped in the standard fashion using chlorhexidine skin prep. Local anesthesia was attained by  infiltration with 1% lidocaine. A small dermatotomy was made. Under real-time ultrasound guidance, an 18 gauge trocar needle was advanced into the margin of the mass. Multiple 18 gauge core biopsies were then obtained coaxially using the bio Pince automated biopsy device. Biopsy specimens were placed in formalin and delivered to pathology for further analysis. Repeat CT imaging was performed demonstrating a very small amount of perinephric hemorrhage. This is not unexpected. Patient remained clinically asymptomatic. IMPRESSION: Technically successful combined ultrasound and CT guided biopsy of right renal mass. Electronically Signed   By: Jacqulynn Cadet M.D.   On: 08/26/2020 12:21    Labs:  CBC: Recent Labs    07/20/20 1520 08/20/20 0941 08/28/20 0849 09/14/20 0812  WBC 8.2 7.9 6.5 7.2  HGB 10.6* 11.4* 11.7* 11.9*  HCT 34.8* 36.6* 35.9* 36.9*  PLT 278 280 229 239    COAGS: Recent Labs    03/13/20 1043 04/04/20 1254 05/08/20 1249 08/20/20 0941  INR 1.1 1.2 1.2 1.0  APTT 29 38* 36 29    BMP: Recent Labs    11/06/19 1132 01/07/20 1018 01/17/20 0837 02/10/20 0803 06/24/20 1517 07/20/20 1520 08/28/20 0849 09/14/20 0812  NA 136 136 137   < > 137 139 141 141  K 4.4 3.5 4.0   < > 4.3 4.6 3.8 3.5  CL 100 101 101   < > 99 103 104 106  CO2 29 24 29    < > 28 27 28 27   GLUCOSE 91 87 87   < > 100* 106* 77 85  BUN 15 12 17    < > 15 12 13 16   CALCIUM 9.3 8.9 9.1   < > 9.7 9.3 9.5 9.9  CREATININE 1.26 1.06 0.96   < > 0.67* 0.84 0.93 0.95  GFRNONAA 62 >60 >60   < > 105 >60 >60 >60  GFRAA 72 >60 >60  --  122  --   --   --    < > = values in this interval not displayed.    LIVER FUNCTION TESTS: Recent Labs    05/11/20 0103  06/24/20 1517 07/20/20 1520 08/28/20 0849 09/14/20 0812  BILITOT 0.7 0.4 0.3 0.3 0.4  AST 11* 12 13* 14* 15  ALT 11 10 10 6 9   ALKPHOS 68  --  72 63 65  PROT 5.5* 7.3 7.6 7.1 7.3  ALBUMIN 1.9*  --  3.6 3.7 3.8    TUMOR MARKERS: No results for input(s): AFPTM, CEA, CA199, CHROMGRNA in the last 8760 hours.  Assessment and Plan:  60 y/o M with non-small cell lung cancer planned to begin systemic palliative chemotherapy who presents today for a port placement for durable venous access.  Risks and benefits of image-guided Port-a-catheter placement were discussed with the patient including, but not limited to bleeding, infection, pneumothorax, or fibrin sheath development and need for additional procedures.  All of the patient's questions were answered, patient is agreeable to proceed.  Consent signed and in chart.  Thank you for this interesting consult.  I greatly enjoyed meeting Richard Li and look forward to participating in their care.  A copy of this report was sent to the requesting provider on this date.  Electronically Signed: Joaquim Nam, PA-C 09/18/2020, 8:32 AM   I spent a total of  30 Minutes  in face to face in clinical consultation, greater than 50% of which was counseling/coordinating care for port placement.

## 2020-09-22 ENCOUNTER — Telehealth: Payer: Self-pay | Admitting: Internal Medicine

## 2020-09-22 ENCOUNTER — Other Ambulatory Visit: Payer: Self-pay

## 2020-09-22 ENCOUNTER — Ambulatory Visit
Admission: RE | Admit: 2020-09-22 | Discharge: 2020-09-22 | Disposition: A | Discharge status: Home / Self Care | Payer: BC Managed Care – PPO | Source: Ambulatory Visit | Attending: Radiation Oncology | Admitting: Radiation Oncology

## 2020-09-22 DIAGNOSIS — C3411 Malignant neoplasm of upper lobe, right bronchus or lung: Secondary | ICD-10-CM | POA: Diagnosis not present

## 2020-09-22 DIAGNOSIS — C3491 Malignant neoplasm of unspecified part of right bronchus or lung: Secondary | ICD-10-CM

## 2020-09-22 MED ORDER — SONAFINE EX EMUL
1.0000 "application " | Freq: Once | CUTANEOUS | Status: AC
  Administered 2020-09-22: 1 via TOPICAL

## 2020-09-22 NOTE — Telephone Encounter (Signed)
Called and spoke with patient to advise of added appts and change in schedule for 6/2. Radiation was moved to an earlier time and chemo was scheduled. Patient is aware of all appts.

## 2020-09-23 ENCOUNTER — Ambulatory Visit
Admission: RE | Admit: 2020-09-23 | Discharge: 2020-09-23 | Disposition: A | Discharge status: Home / Self Care | Payer: BC Managed Care – PPO | Source: Ambulatory Visit | Attending: Radiation Oncology | Admitting: Radiation Oncology

## 2020-09-23 ENCOUNTER — Encounter: Payer: Self-pay | Admitting: Internal Medicine

## 2020-09-23 DIAGNOSIS — C7931 Secondary malignant neoplasm of brain: Secondary | ICD-10-CM | POA: Insufficient documentation

## 2020-09-23 DIAGNOSIS — Z510 Encounter for antineoplastic radiation therapy: Secondary | ICD-10-CM | POA: Insufficient documentation

## 2020-09-23 DIAGNOSIS — C3481 Malignant neoplasm of overlapping sites of right bronchus and lung: Secondary | ICD-10-CM | POA: Diagnosis present

## 2020-09-23 NOTE — Progress Notes (Signed)
Pharmacist Chemotherapy Monitoring - Initial Assessment    Anticipated start date: 09/24/20  Regimen:  . Are orders appropriate based on the patient's diagnosis, regimen, and cycle? Yes . Does the plan date match the patient's scheduled date? Yes . Is the sequencing of drugs appropriate? Yes . Are the premedications appropriate for the patient's regimen? Yes . Prior Authorization for treatment is: Approved o If applicable, is the correct biosimilar selected based on the patient's insurance? not applicable  Organ Function and Labs: [name] Are dose adjustments needed based on the patient's renal function, hepatic function, or hematologic function? No . Are appropriate labs ordered prior to the start of patient's treatment? Yes . Other organ system assessment, if indicated: N/A . The following baseline labs, if indicated, have been ordered: pembrolizumab: baseline TSH +/- T4  Dose Assessment: . Are the drug doses appropriate? Yes . Are the following correct: o Drug concentrations Yes o IV fluid compatible with drug Yes o Administration routes Yes o Timing of therapy Yes . If applicable, does the patient have documented access for treatment and/or plans for port-a-cath placement? yes . If applicable, have lifetime cumulative doses been properly documented and assessed? not applicable Lifetime Dose Tracking  . Carboplatin: 940 mg = 0.01 % of the maximum lifetime dose of 999,999,999 mg  o   Toxicity Monitoring/Prevention: . The patient has the following take home antiemetics prescribed: Prochlorperazine . The patient has the following take home medications prescribed: B12 for pemetrexed . Medication allergies and previous infusion related reactions, if applicable, have been reviewed and addressed. Yes . The patient's current medication list has been assessed for drug-drug interactions with their chemotherapy regimen. no significant drug-drug interactions were identified on review.  Order  Review: . Are the treatment plan orders signed? Yes . Is the patient scheduled to see a provider prior to their treatment? No  I verify that I have reviewed each item in the above checklist and answered each question accordingly.  Romualdo Bolk Jay, [place], 09/23/2020  2:21 PM

## 2020-09-24 ENCOUNTER — Inpatient Hospital Stay: Payer: BC Managed Care – PPO | Attending: Internal Medicine

## 2020-09-24 ENCOUNTER — Ambulatory Visit
Admission: RE | Admit: 2020-09-24 | Discharge: 2020-09-24 | Disposition: A | Discharge status: Home / Self Care | Payer: BC Managed Care – PPO | Source: Ambulatory Visit | Attending: Radiation Oncology | Admitting: Radiation Oncology

## 2020-09-24 ENCOUNTER — Inpatient Hospital Stay: Payer: BC Managed Care – PPO

## 2020-09-24 ENCOUNTER — Other Ambulatory Visit: Payer: Self-pay

## 2020-09-24 ENCOUNTER — Encounter (HOSPITAL_COMMUNITY): Payer: Self-pay | Admitting: Internal Medicine

## 2020-09-24 ENCOUNTER — Other Ambulatory Visit: Payer: Self-pay | Admitting: Internal Medicine

## 2020-09-24 VITALS — BP 121/72 | HR 90 | Temp 98.7°F | Resp 16

## 2020-09-24 DIAGNOSIS — C3491 Malignant neoplasm of unspecified part of right bronchus or lung: Secondary | ICD-10-CM

## 2020-09-24 DIAGNOSIS — Z9221 Personal history of antineoplastic chemotherapy: Secondary | ICD-10-CM | POA: Diagnosis not present

## 2020-09-24 DIAGNOSIS — Z5111 Encounter for antineoplastic chemotherapy: Secondary | ICD-10-CM | POA: Insufficient documentation

## 2020-09-24 DIAGNOSIS — Z79899 Other long term (current) drug therapy: Secondary | ICD-10-CM | POA: Diagnosis not present

## 2020-09-24 DIAGNOSIS — C7951 Secondary malignant neoplasm of bone: Secondary | ICD-10-CM | POA: Diagnosis not present

## 2020-09-24 DIAGNOSIS — C7989 Secondary malignant neoplasm of other specified sites: Secondary | ICD-10-CM | POA: Diagnosis not present

## 2020-09-24 DIAGNOSIS — Z7952 Long term (current) use of systemic steroids: Secondary | ICD-10-CM | POA: Diagnosis not present

## 2020-09-24 DIAGNOSIS — I10 Essential (primary) hypertension: Secondary | ICD-10-CM | POA: Insufficient documentation

## 2020-09-24 DIAGNOSIS — E785 Hyperlipidemia, unspecified: Secondary | ICD-10-CM | POA: Insufficient documentation

## 2020-09-24 DIAGNOSIS — G893 Neoplasm related pain (acute) (chronic): Secondary | ICD-10-CM | POA: Insufficient documentation

## 2020-09-24 DIAGNOSIS — C7931 Secondary malignant neoplasm of brain: Secondary | ICD-10-CM | POA: Diagnosis not present

## 2020-09-24 DIAGNOSIS — N400 Enlarged prostate without lower urinary tract symptoms: Secondary | ICD-10-CM | POA: Insufficient documentation

## 2020-09-24 DIAGNOSIS — C3482 Malignant neoplasm of overlapping sites of left bronchus and lung: Secondary | ICD-10-CM | POA: Diagnosis present

## 2020-09-24 DIAGNOSIS — Z5112 Encounter for antineoplastic immunotherapy: Secondary | ICD-10-CM | POA: Diagnosis present

## 2020-09-24 DIAGNOSIS — Z923 Personal history of irradiation: Secondary | ICD-10-CM | POA: Insufficient documentation

## 2020-09-24 DIAGNOSIS — Z8673 Personal history of transient ischemic attack (TIA), and cerebral infarction without residual deficits: Secondary | ICD-10-CM | POA: Insufficient documentation

## 2020-09-24 DIAGNOSIS — C787 Secondary malignant neoplasm of liver and intrahepatic bile duct: Secondary | ICD-10-CM | POA: Diagnosis not present

## 2020-09-24 LAB — CMP (CANCER CENTER ONLY)
ALT: 6 U/L (ref 0–44)
AST: 12 U/L — ABNORMAL LOW (ref 15–41)
Albumin: 3.6 g/dL (ref 3.5–5.0)
Alkaline Phosphatase: 77 U/L (ref 38–126)
Anion gap: 10 (ref 5–15)
BUN: 13 mg/dL (ref 6–20)
CO2: 24 mmol/L (ref 22–32)
Calcium: 9.7 mg/dL (ref 8.9–10.3)
Chloride: 104 mmol/L (ref 98–111)
Creatinine: 0.91 mg/dL (ref 0.61–1.24)
GFR, Estimated: >60 mL/min (ref >60)
Glucose, Bld: 110 mg/dL — ABNORMAL HIGH (ref 70–99)
Potassium: 3.7 mmol/L (ref 3.5–5.1)
Sodium: 138 mmol/L (ref 135–145)
Total Bilirubin: 0.5 mg/dL (ref 0.3–1.2)
Total Protein: 7.1 g/dL (ref 6.5–8.1)

## 2020-09-24 LAB — CBC WITH DIFFERENTIAL (CANCER CENTER ONLY)
Abs Immature Granulocytes: 0.04 10*3/uL (ref 0.00–0.07)
Basophils Absolute: 0 10*3/uL (ref 0.0–0.1)
Basophils Relative: 0 %
Eosinophils Absolute: 0.1 10*3/uL (ref 0.0–0.5)
Eosinophils Relative: 2 %
HCT: 34.5 % — ABNORMAL LOW (ref 39.0–52.0)
Hemoglobin: 11.4 g/dL — ABNORMAL LOW (ref 13.0–17.0)
Immature Granulocytes: 1 %
Lymphocytes Relative: 7 %
Lymphs Abs: 0.6 10*3/uL — ABNORMAL LOW (ref 0.7–4.0)
MCH: 27.9 pg (ref 26.0–34.0)
MCHC: 33 g/dL (ref 30.0–36.0)
MCV: 84.4 fL (ref 80.0–100.0)
Monocytes Absolute: 0.8 10*3/uL (ref 0.1–1.0)
Monocytes Relative: 10 %
Neutro Abs: 6.3 10*3/uL (ref 1.7–7.7)
Neutrophils Relative %: 80 %
Platelet Count: 221 10*3/uL (ref 150–400)
RBC: 4.09 MIL/uL — ABNORMAL LOW (ref 4.22–5.81)
RDW: 16.3 % — ABNORMAL HIGH (ref 11.5–15.5)
WBC Count: 7.8 10*3/uL (ref 4.0–10.5)
nRBC: 0 % (ref 0.0–0.2)

## 2020-09-24 LAB — TSH: TSH: 1.165 u[IU]/mL (ref 0.320–4.118)

## 2020-09-24 MED ORDER — PALONOSETRON HCL INJECTION 0.25 MG/5ML
0.2500 mg | Freq: Once | INTRAVENOUS | Status: AC
Start: 2020-09-24 — End: 2020-09-24
  Administered 2020-09-24: 0.25 mg via INTRAVENOUS

## 2020-09-24 MED ORDER — DEXAMETHASONE SODIUM PHOSPHATE 100 MG/10ML IJ SOLN
10.0000 mg | Freq: Once | INTRAMUSCULAR | Status: AC
  Administered 2020-09-24: 10 mg via INTRAVENOUS
  Filled 2020-09-24: qty 10

## 2020-09-24 MED ORDER — SODIUM CHLORIDE 0.9 % IV SOLN
500.0000 mg/m2 | Freq: Once | INTRAVENOUS | Status: AC
  Administered 2020-09-24: 1000 mg via INTRAVENOUS
  Filled 2020-09-24: qty 40

## 2020-09-24 MED ORDER — SODIUM CHLORIDE 0.9% FLUSH
10.0000 mL | INTRAVENOUS | Status: DC | PRN
  Administered 2020-09-24: 10 mL
  Filled 2020-09-24: qty 10

## 2020-09-24 MED ORDER — SODIUM CHLORIDE 0.9 % IV SOLN
627.0000 mg | Freq: Once | INTRAVENOUS | Status: AC
  Administered 2020-09-24: 630 mg via INTRAVENOUS
  Filled 2020-09-24: qty 63

## 2020-09-24 MED ORDER — SODIUM CHLORIDE 0.9 % IV SOLN
200.0000 mg | Freq: Once | INTRAVENOUS | Status: AC
  Administered 2020-09-24: 200 mg via INTRAVENOUS
  Filled 2020-09-24: qty 8

## 2020-09-24 MED ORDER — SODIUM CHLORIDE 0.9 % IV SOLN
Freq: Once | INTRAVENOUS | Status: AC
  Filled 2020-09-24: qty 250

## 2020-09-24 MED ORDER — SODIUM CHLORIDE 0.9 % IV SOLN
150.0000 mg | Freq: Once | INTRAVENOUS | Status: AC
  Administered 2020-09-24: 150 mg via INTRAVENOUS
  Filled 2020-09-24: qty 150

## 2020-09-24 MED ORDER — PALONOSETRON HCL INJECTION 0.25 MG/5ML
INTRAVENOUS | Status: AC
  Filled 2020-09-24: qty 5

## 2020-09-24 MED ORDER — OXYCODONE-ACETAMINOPHEN 5-325 MG PO TABS: 1.0000 | ORAL_TABLET | Freq: Four times a day (QID) | ORAL | 0 refills | Status: DC | PRN

## 2020-09-24 MED ORDER — HEPARIN SOD (PORK) LOCK FLUSH 100 UNIT/ML IV SOLN
500.0000 [IU] | Freq: Once | INTRAVENOUS | Status: AC | PRN
  Administered 2020-09-24: 500 [IU]
  Filled 2020-09-24: qty 5

## 2020-09-24 NOTE — Patient Instructions (Signed)
[facility]  Discharge Instructions: Thank you for choosing [facility] to provide your oncology and hematology care.   If you have a lab appointment with the [facility], please go directly to the [facility] and check in at the registration area.   Wear comfortable clothing and clothing appropriate for easy access to any Portacath or PICC line.   We strive to give you quality time with your provider. You may need to reschedule your appointment if you arrive late (15 or more minutes).  Arriving late affects you and other patients whose appointments are after yours.  Also, if you miss three or more appointments without notifying the office, you may be dismissed from the clinic at the provider's discretion.      For prescription refill requests, have your pharmacy contact our office and allow 72 hours for refills to be completed.    Today you received the following chemotherapy and/or immunotherapy agents; Keytruda, Alimta, carboplatin     To help prevent nausea and vomiting after your treatment, we encourage you to take your nausea medication as directed.  BELOW ARE SYMPTOMS THAT SHOULD BE REPORTED IMMEDIATELY: . *FEVER GREATER THAN 100.4 F (38 C) OR HIGHER . *CHILLS OR SWEATING . *NAUSEA AND VOMITING THAT IS NOT CONTROLLED WITH YOUR NAUSEA MEDICATION . *UNUSUAL SHORTNESS OF BREATH . *UNUSUAL BRUISING OR BLEEDING . *URINARY PROBLEMS (pain or burning when urinating, or frequent urination) . *BOWEL PROBLEMS (unusual diarrhea, constipation, pain near the anus) . TENDERNESS IN MOUTH AND THROAT WITH OR WITHOUT PRESENCE OF ULCERS (sore throat, sores in mouth, or a toothache) . UNUSUAL RASH, SWELLING OR PAIN  . UNUSUAL VAGINAL DISCHARGE OR ITCHING   Items with * indicate a potential emergency and should be followed up as soon as possible or go to the Emergency Department if any problems should occur.  Please show the CHEMOTHERAPY ALERT CARD or  IMMUNOTHERAPY ALERT CARD at check-in to the Emergency Department and triage nurse.  Should you have questions after your visit or need to cancel or reschedule your appointment, please contact [place]  Dept: [phone]  and follow the prompts.  Office hours are 8:00 a.m. to 4:30 p.m. Monday - Friday. Please note that voicemails left after 4:00 p.m. may not be returned until the following business day.  We are closed weekends and major holidays. You have access to a nurse at all times for urgent questions. Please call the main number to the clinic Dept: [phone] and follow the prompts.   For any non-urgent questions, you may also contact your provider using MyChart. We now offer e-Visits for anyone 97 and older to request care online for non-urgent symptoms. For details visit [email or web address].   Also download the MyChart app! Go to the app store, search "MyChart", open the app, select [facility], and log in with your MyChart username and password.  Due to Covid, a mask is required upon entering the hospital/clinic. If you do not have a mask, one will be given to you upon arrival. For doctor visits, patients may have 1 support person aged 33 or older with them. For treatment visits, patients cannot have anyone with them due to current Covid guidelines and our immunocompromised population.

## 2020-09-25 ENCOUNTER — Ambulatory Visit
Admission: RE | Admit: 2020-09-25 | Discharge: 2020-09-25 | Disposition: A | Discharge status: Home / Self Care | Payer: BC Managed Care – PPO | Source: Ambulatory Visit | Attending: Radiation Oncology | Admitting: Radiation Oncology

## 2020-09-25 ENCOUNTER — Ambulatory Visit (HOSPITAL_COMMUNITY): Payer: BC Managed Care – PPO

## 2020-09-25 ENCOUNTER — Encounter: Payer: Self-pay | Admitting: Nutrition

## 2020-09-25 DIAGNOSIS — C7931 Secondary malignant neoplasm of brain: Secondary | ICD-10-CM | POA: Diagnosis not present

## 2020-09-25 NOTE — Progress Notes (Signed)
Provided 1 complementary case of Ensure Plus.

## 2020-09-25 NOTE — Progress Notes (Signed)
[place] OFFICE PROGRESS NOTE  [name], DO [place]  [address] [place] [address]  DIAGNOSIS: Metastatic non-small cell lung cancer initially diagnosed as stage IIB (T3, N0, M0) non-small cell lung cancer, adenocarcinoma presented with large central perihilar mass with suspicious groundglass opacity in the right middle lobe and left upper lobe diagnosed in September 2021.The patient had evidence of metastatic disease to the right kidney, right pleural space, liver, thoracic:/Abdominal lymph nodes, and bones.  There is also a probable left-sided pleural metastasis as well in March 2022/May 2022.   Molecular studies by guardant 360: No actionable mutations.  PDl1: 80%  PRIOR THERAPY: Concurrent chemoradiation with weekly carboplatin for AUC of 2 and paclitaxel 45 mg/M2. First dose February 10, 2020. Status post 5 cycles.  CURRENT THERAPY:  1) Palliative systemic chemotherapy with carboplatin AUC of 5, Alimta 500 mg per metered squared, Keytruda 200 mg IV every 3 weeks.  First dose expected on 09/24/2020. Status post 1 cycle.  2) Palliative radiation to the left anterior rib cage area as well as the mid back area under the care of Dr. Sondra Come. Last treatment expected on 10/05/20.  INTERVAL HISTORY: Richard Li 60 y.o. male returns to the clinic today for a follow-up visit accompanied by his wife.  The patient is feeling fatigued today.  The patient recently was found to have evidence of disease progression; therefore, the patient started palliative systemic chemotherapy.  He had his first dose of treatment last week and he tolerated it fair except for fatigue and nausea without vomiting.  He takes Compazine for his nausea with some improvement in his nausea. he also was referred to Dr. 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  His last dose of radiation is  expected on 10/05/2020.  He takes oxycodone 2-3 times a day for pain as well as Tylenol.  He states that without the pain medication he rates his pain a 7-9 out of 10 which is a sharp pain.  With the pain medication, takes his pain to a 2-3 out of 10.   Otherwise, the patient denies any recent fever or chills.  He has been having night sweats on and off.  He reports some dyspnea on exertion and a chronic cough but denies any hemoptysis.  He takes inhalers and is scheduled to see pulmonology later today.  He has a cough and takes Mucinex.  He has not tried taking Robitussin or Delsym at this time.  He reports a good appetite and his weight is stable. He denies any diarrhea.  He has some constipation likely secondary to his pain medication use.  He only uses a stool softener 1 time at nighttime. He denies any headache or visual changes.  The patient also had a repeat brain MRI performed to rule out metastatic disease to the brain.  The patient has a small patch of a rash on his right shin which predated his infusion last week.  The patient is here today for evaluation in 1 week follow-up visit to manage any adverse side effects of treatment.  MEDICAL HISTORY: Past Medical History:  Diagnosis Date  . Allergic rhinitis, cause unspecified   . Anxiety state, unspecified   . Asthma    as a child  . Chronic airway obstruction, not elsewhere classified   . Esophageal reflux   . History of kidney stones   .  History of radiation therapy 02/13/2020-03/24/2020   right lung        Dr Gery Pray  . Hypertension   . Lumbago   . lung ca dx'd 12/2019  . Other and unspecified hyperlipidemia   . Other chest pain     ALLERGIES:  is allergic to amoxicillin.  MEDICATIONS:  Current Outpatient Medications  Medication Sig Dispense Refill  . acetaminophen (TYLENOL) 500 MG tablet Take 1,000 mg by mouth 2 (two) times daily.    . Brimonidine Tartrate (LUMIFY) 0.025 % SOLN Place 1 drop into both eyes 2 (two) times a  week. As needed    . dextromethorphan-guaiFENesin (MUCINEX DM) 30-600 MG 12hr tablet Take 1 tablet by mouth 2 (two) times daily.    [name] doxycycline (VIBRA-TABS) 100 MG tablet Take 1 tablet (100 mg total) by mouth 2 (two) times daily. 60 tablet 1  . feeding supplement (ENSURE ENLIVE / ENSURE PLUS) LIQD Take 237 mLs by mouth 3 (three) times daily between meals. (Patient taking differently: Take 237 mLs by mouth 2 (two) times daily between meals.) 237 mL 12  . fenofibrate 160 MG tablet Take 1 tablet by mouth once daily (Patient taking differently: Take 160 mg by mouth daily.) 90 tablet 3  . ferrous sulfate 325 (65 FE) MG tablet Take 1 tablet (325 mg total) by mouth daily with breakfast. 30 tablet 0  . folic acid (FOLVITE) 1 MG tablet Take 1 tablet (1 mg total) by mouth daily. 30 tablet 2  . Ipratropium-Albuterol (COMBIVENT RESPIMAT) 20-100 MCG/ACT AERS respimat Inhale 1-2 puffs into the lungs every 6 (six) hours as needed for wheezing. 4 g 3  . lidocaine-prilocaine (EMLA) cream Apply 1 application topically as needed. 30 g 2  . LORazepam (ATIVAN) 1 MG tablet Take 1 tablet (1 mg total) by mouth See admin instructions. Take one tablet (1 mg) by mouth daily at bedtime, may also take one tablet (1 mg) twice during the day as needed for anxiety (Patient taking differently: Take 1 mg by mouth 3 (three) times daily. Take one tablet (1 mg) by mouth daily at bedtime, may also take one tablet (1 mg) twice during the day as needed for anxiety) 90 tablet 2  . meclizine (ANTIVERT) 25 MG tablet Take 1 tablet (25 mg total) by mouth every 6 (six) hours as needed for dizziness. 60 tablet 0  . Multiple Vitamin (MULTIVITAMIN WITH MINERALS) TABS tablet Take 1 tablet by mouth daily.    . ondansetron (ZOFRAN) 8 MG tablet Take 1 tablet (8 mg total) by mouth every 8 (eight) hours as needed for nausea or vomiting. 30 tablet 2  . oxyCODONE-acetaminophen (PERCOCET/ROXICET) 5-325 MG tablet Take 1 tablet by mouth every 6 (six) hours as  needed for severe pain. 30 tablet 0  . predniSONE (DELTASONE) 10 MG tablet Take 1 tablet (10 mg total) by mouth daily with breakfast. 30 tablet 1  . prochlorperazine (COMPAZINE) 10 MG tablet Take 1 tablet (10 mg total) by mouth every 6 (six) hours as needed. (Patient not taking: Reported on 09/30/2020) 30 tablet 2   No current facility-administered medications for this visit.   Facility-Administered Medications Ordered in Other Visits  Medication Dose Route Frequency Provider Last Rate Last Admin  . 0.9 %  sodium chloride infusion   Intravenous Continuous Curt Bears, MD   Stopped at 03/05/20 1410    SURGICAL HISTORY:  Past Surgical History:  Procedure Laterality Date  . APPENDECTOMY    . BRONCHIAL BIOPSY  01/07/2020  Procedure: BRONCHIAL BIOPSIES;  Surgeon: Collene Gobble, MD;  Location: [facility] END[facility]COPY;  Service: Pulmonary;;  . BRONCHIAL BIOPSY  04/05/2020   Procedure: BRONCHIAL BIOPSIES;  Surgeon: Rigoberto Noel, MD;  Location: [place];  Service: Cardiopulmonary;;  . BRONCHIAL BRUSHINGS  01/07/2020   Procedure: BRONCHIAL BRUSHINGS;  Surgeon: Collene Gobble, MD;  Location: [facility] ENDOSCOPY;  Service: Pulmonary;;  . BRONCHIAL NEEDLE ASPIRATION BIOPSY  01/07/2020   Procedure: BRONCHIAL NEEDLE ASPIRATION BIOPSIES;  Surgeon: Collene Gobble, MD;  Location: [facility] ENDOSCOPY;  Service: Pulmonary;;  . BRONCHIAL WASHINGS  01/07/2020   Procedure: BRONCHIAL WASHINGS;  Surgeon: Collene Gobble, MD;  Location: [facility] ENDOSCOPY;  Service: Pulmonary;;  . BRONCHIAL WASHINGS  04/05/2020   Procedure: BRONCHIAL WASHINGS;  Surgeon: Rigoberto Noel, MD;  Location: [place];  Service: Cardiopulmonary;;  . COLONOSCOPY  15 years ago  . HEMOSTASIS CONTROL  01/07/2020   Procedure: HEMOSTASIS CONTROL;  Surgeon: Collene Gobble, MD;  Location[facility] ENDOSCOPY;  Service: Pulmonary;;  cold saline  . IR IMAGING GUIDED PORT INSERTION  09/18/2020  . NASAL TURBINATE REDUCTION  2002   Dr.Crossley  . VASECTOMY    . VIDEO  BRONCHOSCOPY Right 04/05/2020   Procedure: VIDEO BRONCHOSCOPY WITH FLUORO;  Surgeon: Rigoberto Noel, MD;  Location: [place];  Service: Cardiopulmonary;  Laterality: Right;  [name] VIDEO BRONCHOSCOPY WITH ENDOBRONCHIAL NAVIGATION N/A 01/07/2020   Procedure: VIDEO BRONCHOSCOPY WITH ENDOBRONCHIAL NAVIGATION;  Surgeon: Collene Gobble, MD;  Location: [place] ENDOSCOPY;  Service: Pulmonary;  Laterality: N/A;  . VIDEO BRONCHOSCOPY WITH ENDOBRONCHIAL ULTRASOUND N/A 01/07/2020   Procedure: VIDEO BRONCHOSCOPY WITH ENDOBRONCHIAL ULTRASOUND;  Surgeon: Collene Gobble, MD;  Location: [place] ENDOSCOPY;  Service: Pulmonary;  Laterality: N/A;    REVIEW OF SYSTEMS:   Review of Systems  Constitutional: Positive for fatigue.  Negative for appetite change, chills, fever and unexpected weight change.  HENT: Negative for mouth sores, nosebleeds, sore throat and trouble swallowing.   Eyes: Negative for eye problems and icterus.  Respiratory: Positive for cough and shortness of breath with exertion.  Positive for wheezing.  Negative for hemoptysis Cardiovascular: Negative for chest pain and leg swelling.  Gastrointestinal: Positive for nausea and constipation. Negative for abdominal pain,  diarrhea, and vomiting.  Genitourinary: Negative for bladder incontinence, difficulty urinating, dysuria, frequency and hematuria.   Musculoskeletal: Positive for bilateral flank pain right greater than left. Negative for gait problem, neck pain and neck stiffness.  Skin: Rash on right shin.  Neurological: Negative for dizziness, extremity weakness, gait problem, headaches, light-headedness and seizures.  Hematological: Negative for adenopathy. Does not bruise/bleed easily.  Psychiatric/Behavioral: Negative for confusion, depression and sleep disturbance. The patient is not nervous/anxious.     PHYSICAL EXAMINATION:  Blood pressure 139/60, pulse 83, temperature 97.7 F (36.5 C), temperature source Tympanic, resp. rate 17, height 5' 10"  (1.778 m), weight 190 lb 12.8 oz (86.5 kg), SpO2 100 %.  ECOG PERFORMANCE STATUS: 1 - Symptomatic but completely ambulatory  Physical Exam  Constitutional: Oriented to person, place, and time and well-developed, well-nourished, and in no distress.  HENT:  Head: Normocephalic and atraumatic.  Mouth/Throat: Oropharynx is clear and moist. No oropharyngeal exudate.  Eyes: Conjunctivae are normal. Right eye exhibits no discharge. Left eye exhibits no discharge. No scleral icterus.  Neck: Normal range of motion. Neck supple.  Cardiovascular: Normal rate, regular rhythm, normal heart sounds and intact distal pulses.   Pulmonary/Chest: Effort normal.  Positive for bilateral rhonchi/wheezing. No respiratory distress. No rales.  Abdominal: Soft.  Bowel sounds are normal. Exhibits no distension and no mass. There is no tenderness.  Musculoskeletal: Normal range of motion. Exhibits no edema.  Lymphadenopathy:    No cervical adenopathy.  Neurological: Alert and oriented to person, place, and time. Exhibits normal muscle tone. Gait normal. Coordination normal.  Skin: Skin is warm and dry. No rash noted. Not diaphoretic. No erythema. No pallor.  Psychiatric: Mood, memory and judgment normal.  Vitals reviewed.  LABORATORY DATA: Lab Results  Component Value Date   WBC 4.1 09/30/2020   HGB 10.4 (L) 09/30/2020   HCT 32.3 (L) 09/30/2020   MCV 86.4 09/30/2020   PLT 191 09/30/2020      Chemistry      Component Value Date/Time   NA 139 09/30/2020 1425   K 5.0 09/30/2020 1425   CL 101 09/30/2020 1425   CO2 28 09/30/2020 1425   BUN 16 09/30/2020 1425   CREATININE 0.96 09/30/2020 1425   CREATININE 0.67 (L) 06/24/2020 1517      Component Value Date/Time   CALCIUM 9.6 09/30/2020 1425   ALKPHOS 77 09/30/2020 1425   AST 17 09/30/2020 1425   ALT 13 09/30/2020 1425   BILITOT 0.5 09/30/2020 1425       RADIOGRAPHIC STUDIES:  MR Brain W Wo Contrast  Result Date: 09/27/2020 CLINICAL DATA:   60 year old male with metastatic non-small cell lung cancer. Restaging. EXAM: MRI HEAD WITHOUT AND WITH CONTRAST TECHNIQUE: Multiplanar, multiecho pulse sequences of the brain and surrounding structures were obtained without and with intravenous contrast. CONTRAST:  62m GADAVIST GADOBUTROL 1 MMOL/ML IV SOLN COMPARISON:  Brain MRI 01/25/2020. FINDINGS: Brain: Solitary new rim enhancing lesion in the central right cerebellum measures 9 mm diameter (series 16, image 33) with mild regional T2 and FLAIR hyperintense edema. No significant posterior fossa mass effect. The lesion is mildly positive on DWI. Elsewhere punctate restricted diffusion in the left centrum semiovale (series 5, image 91) is without associated enhancement, but rather has punctate T2/FLAIR hyperintensity and decreased postcontrast T1 signal. This most resembles a punctate acute lacunar infarct. No other abnormal diffusion or enhancement identified. No dural thickening. Minimal additional nonspecific cerebral white matter T2 and FLAIR hyperintensity with no cortical encephalomalacia. No chronic cerebral blood products identified. No midline shift, ventriculomegaly, extra-axial collection or acute intracranial hemorrhage. Cervicomedullary junction and pituitary are within normal limits. Vascular: Major intracranial vascular flow voids are stable since last year. Dominant left vertebral artery, left transverse and sigmoid sinuses. The major dural venous sinuses demonstrates stable enhancement on postcontrast images. Skull and upper cervical spine: Negative visible cervical spine and spinal cord. Visualized bone marrow signal is within normal limits. Sinuses/Orbits: Stable, negative. Other: Stable mild mastoid effusions. Grossly normal visible internal auditory structures. Negative visible scalp and face. IMPRESSION: 1. Positive for a solitary new 9 mm rim enhancing Metastasis in the central right cerebellum. Mild edema but no mass effect. 2. A  superimposed punctate focus of nonenhancing abnormal diffusion in the left centrum semiovale is most compatible with Recent white matter Lacunar Infarct. Attention directed on follow-up to ensure expected resolution. 3. No other metastatic disease or acute intracranial abnormality identified. Electronically Signed   By: HGenevie AnnM.D.   On: 09/27/2020 08:26   NM PET Image Restag (PS) Skull Base To Thigh  Result Date: 09/11/2020 CLINICAL DATA:  Subsequent treatment strategy for non-small-cell lung cancer diagnosed last September. Radiation therapy and chemotherapy complete. EXAM: NUCLEAR MEDICINE PET SKULL BASE TO THIGH TECHNIQUE: mCi F-18 FDG was injected intravenously. Full-ring PET  imaging was performed from the skull base to thigh after the radiotracer. CT data was obtained and used for attenuation correction and anatomic localization. Fasting blood glucose:  mg/dl COMPARISON:  Chest CT of 07/20/2020. Abdominal MRI of 06/27/2020. Most recent PET of 01/16/2020. FINDINGS: Mediastinal blood pool activity: SUV max 2.3 Liver activity: SUV max NA NECK: No cervical nodal hypermetabolism. Lower cervical left-sided scalene muscular hypermetabolism is likely due to motion after radiopharmaceutical. Incidental CT findings: No cervical adenopathy. CHEST: Circumferential right apical pleural thickening with low-level hypermetabolism is is suspicious but may be partially treatment related. However, new areas of extensive right-sided pleural irregularity and hypermetabolism more inferiorly are consistent with metastatic disease. Example at 7 mm and a S.U.V. max of 7.2 on 103/4. Hypermetabolism along the right cardiac border is favored to be due to medial pleural disease including at a S.U.V. max of 11.2 on image 100/4. Right paratracheal node measures 6 mm and a S.U.V. max of 5.0 on 75/4. Probable left-sided pleural disease is evidenced by hypermetabolism which is favored to be pleural rather than arising from the adjacent  rib. Example at a S.U.V. max of 5.3 on 85/4. Incidental CT findings: Aortic atherosclerosis. Moderate centrilobular emphysema. Consolidation and cavitation in the right apex at the site of treated tumor. Index left lower lobe pulmonary nodule of 7 mm on 43/8 measured 6 mm on 07/20/2020. ABDOMEN/PELVIS: New metastasis within the high left hepatic lobe at 1.4 cm and a S.U.V. max of 12.0 on 118/4 . The previously detailed inter/upper pole right renal mass is hypermetabolic, on the order of 3.3 cm and a S.U.V. max of 13.0 on 139/4. Retrocaval node measures 7 mm and a S.U.V. max of 8.8 on 141/4. Incidental CT findings: Abdominal aortic atherosclerosis. Mild prostatomegaly. SKELETON: Widespread osseous metastasis. Index T12 lesion measures a S.U.V. max of 16.7. A right acetabular lesion is relatively CT occult at a S.U.V. max of 9.7 on 195/4. T11 spinous process lesion measures a S.U.V. max of 8.9. Metastasis involving the left C1 arch is relatively CT occult at a S.U.V. max of 7.7 on 28/4. Incidental CT findings: none IMPRESSION: 1. Widespread hypermetabolic metastatic disease, including to the right pleural space, liver, thoracoabdominal nodes, bones. Probable left-sided pleural metastasis as well. 2. Pulmonary nodules which are below PET resolution. Suspect mild enlargement of a left lower lobe nodule, also suspicious for metastatic disease. 3. Aortic atherosclerosis (ICD10-I70.0) and emphysema (ICD10-J43.9). Electronically Signed   By: Abigail Miyamoto M.D.   On: 09/11/2020 09:15   IR IMAGING GUIDED PORT INSERTION  Result Date: 09/18/2020 INDICATION: 60 year old male with history of non-small cell lung cancer requiring central venous access for chemotherapy. EXAM: IMPLANTED PORT A CATH PLACEMENT WITH ULTRASOUND AND FLUOROSCOPIC GUIDANCE COMPARISON:  None. MEDICATIONS: None. ANESTHESIA/SEDATION: Moderate (conscious) sedation was employed during this procedure. A total of Versed 2 mg and Fentanyl 100 mcg was  administered intravenously. Moderate Sedation Time: 16 minutes. The patient's level of consciousness and vital signs were monitored continuously by radiology nursing throughout the procedure under my direct supervision. CONTRAST:  None FLUOROSCOPY TIME:  0 minutes, 24 seconds (2 mGy) COMPLICATIONS: None immediate. PROCEDURE: The procedure, risks, benefits, and alternatives were explained to the patient. Questions regarding the procedure were encouraged and answered. The patient understands and consents to the procedure. The right neck and chest were prepped with chlorhexidine in a sterile fashion, and a sterile drape was applied covering the operative field. Maximum barrier sterile technique with sterile gowns and gloves were used for the procedure. A  timeout was performed prior to the initiation of the procedure. Ultrasound was used to examine the jugular vein which was compressible and free of internal echoes. A skin marker was used to demarcate the planned venotomy and port pocket incision sites. Local anesthesia was provided to these sites and the subcutaneous tunnel track with 1% lidocaine with 1:100,000 epinephrine. A small incision was created at the jugular access site and blunt dissection was performed of the subcutaneous tissues. Under ultrasound guidance, the jugular vein was accessed with a 21 ga micropuncture needle and an 0.018" wire was inserted to the superior vena cava. Real-time ultrasound guidance was utilized for vascular access including the acquisition of a permanent ultrasound image documenting patency of the accessed vessel. A 5 Fr micopuncture set was then used, through which a 0.035" Rosen wire was passed under fluoroscopic guidance into the inferior vena cava. An 8 Fr dilator was then placed over the wire. A subcutaneous port pocket was then created along the upper chest wall utilizing a combination of sharp and blunt dissection. The pocket was irrigated with sterile saline, packed with  gauze, and observed for hemorrhage. A single lumen "ISP" sized power injectable port was chosen for placement. The 8 Fr catheter was tunneled from the port pocket site to the venotomy incision. The port was placed in the pocket. The external catheter was trimmed to appropriate length. The dilator was exchanged for an 8 Fr peel-away sheath under fluoroscopic guidance. The catheter was then placed through the sheath and the sheath was removed. Final catheter positioning was confirmed and documented with a fluoroscopic spot radiograph. The port was accessed with a Huber needle, aspirated, and flushed with heparinized saline. The deep dermal layer of the port pocket incision was closed with interrupted 3-0 Vicryl suture. Dermabond was then placed over the port pocket and neck incisions. The patient tolerated the procedure well without immediate post procedural complication. FINDINGS: After catheter placement, the tip lies within the superior cavoatrial junction. The catheter aspirates and flushes normally and is ready for immediate use. IMPRESSION: Successful placement of a power injectable Port-A-Cath via the right internal jugular vein. The catheter is ready for immediate use. Ruthann Cancer, MD [facility] Radiology Electronically Signed   By: Ruthann Cancer MD   On: 09/18/2020 10:15     ASSESSMENT/PLAN:  This is a very pleasant 60 year old Caucasian male with metastatic non-small cell lung cancer.  He was initially diagnosed as a stage IIb (T3, N0, M0) non-small cell lung cancer, adenocarcinoma.  He originally presented with a perihilar right upper lobe lung mass and suspicious groundglass opacity in the right middle lobe.  He had evidence of metastatic disease in March/ May 2022 with a biopsy-proven metastatic lesion in the right kidney as well as right pleural space, liver, thoracic/abdominal lymph nodes, and bones.  There is also a probable left-sided pleural  metastasis.  The patient's PD-L1 expression is 80%.  He is negative for any actionable mutations by guardant 360.  The patient was not a good candidate for resection according to Dr. Roxan Hockey.  Therefore, the patient underwent a course of concurrent chemoradiation with weekly carboplatin for an AUC of 2 and paclitaxel 45 mg per metered squared status post 5 cycles.  He experienced some fatigue, mild odynophagia as well as recurrent pneumonia and septicemia.  He also had radiation-induced pneumonitis.  He was admitted to the hospital several times with recurrent pneumonia.   In March 2022, the patient had a biopsy confirmed metastatic  lesion to the kidney.   The patient is currently undergoing palliative systemic chemotherapy with carboplatin for an AUC of 5, Alimta 500 mg per metered squared, Keytruda 20 mg IV every 3 weeks.  He is status post 1 cycle and tolerated it fair except for fatigue and nausea without vomiting  The patient recently had a staging brain MRI which showed a small metastatic brain lesion.  The patient is currently working with Dr. Sondra Come and is going to undergo SRS to this brain lesion  He is also currently undergoing palliative radiation to the painful ribs and mid thoracic metastatic bone lesions under the care of Dr. Sondra Come.  The patient was seen by Dr. Julien Nordmann.  Labs reviewed.  Recommend that he continue on the same treatment at the same dose.  We will see him back for follow-up visit in 2 weeks for evaluation before starting cycle #2.  The patient will continue taking oxycodone for pain control.  He is also undergoing palliative radiation which will hopefully help with pain.  I reviewed constipation education with the patient today and provided instructions on his AVS.  The patient will follow up with pulmonology regarding his wheezing at his appointment which is scheduled for later today.  In the meantime, the patient was advised he can use Robitussin or Delsym as  needed to suppress his cough.  He also will continue taking Mucinex.  For his nausea, I also sent a prescription for Zofran every 8 hours as needed for nausea starting day 3 after chemotherapy   The patient was advised to call immediately if he has any concerning symptoms in the interval. The patient voices understanding of current disease status and treatment options and is in agreement with the current care plan. All questions were answered. The patient knows to call the clinic with any problems, questions or concerns. We can certainly see the patient much sooner if necessary      No orders of the defined types were placed in this encounter.     Cassandra L Heilingoetter, PA-C 09/30/20  ADDENDUM: Hematology/Oncology Attending: I had a face-to-face encounter with the patient today.  I reviewed his record, labs and recommended his care plan.  This is a very pleasant 60 years old white male with metastatic non-small cell lung cancer in May 2022 that was initially diagnosed as a stage IIb (T3, N0, M0) non-small cell lung cancer, adenocarcinoma in September 2021 status post a course of concurrent chemoradiation with weekly carboplatin and paclitaxel that was complicated with several admissions and treatment for pneumonia.  The patient did not receive any consolidation immunotherapy because of his frequent infection and poor condition at that time. His molecular studies were negative for actionable mutation and PD-L1 expression was 80%. The patient is currently undergoing systemic chemotherapy with carboplatin for AUC of 5, Alimta 500 Mg/M2 and Keytruda 200 Mg IV every 3 weeks status post 1 cycle started last week.  He is also undergoing palliative radiotherapy to some of the painful bony lesions in addition to his solitary brain metastasis with SRS. He tolerated the first week of his treatment well.  I recommended for him to continue his current treatment with systemic chemotherapy and he is  expected to start cycle #2 in 2 weeks. The patient was advised to call immediately if he has any other concerning symptoms in the interval. Disclaimer: This note was dictated with voice recognition software. Similar sounding words can inadvertently be transcribed and may be missed upon review. Eilleen Kempf,  MD 09/30/20

## 2020-09-26 ENCOUNTER — Other Ambulatory Visit: Payer: Self-pay

## 2020-09-26 ENCOUNTER — Ambulatory Visit (HOSPITAL_COMMUNITY)
Admission: RE | Admit: 2020-09-26 | Discharge: 2020-09-26 | Disposition: A | Discharge status: Home / Self Care | Payer: BC Managed Care – PPO | Source: Ambulatory Visit | Attending: Physician Assistant | Admitting: Physician Assistant

## 2020-09-26 DIAGNOSIS — C3491 Malignant neoplasm of unspecified part of right bronchus or lung: Secondary | ICD-10-CM | POA: Diagnosis not present

## 2020-09-26 IMAGING — MR MR HEAD WO/W CM
15 of 16 series · 41 of 48 positions shown · IV contrast (gadavist)
Comparison: Brain MRI [DATE].

CLINICAL DATA: 59-year-old male with metastatic non-small cell lung
cancer. Restaging.

EXAM:
MRI HEAD WITHOUT AND WITH CONTRAST
TECHNIQUE: Multiplanar, multiecho pulse sequences of the brain and surrounding
structures were obtained without and with intravenous contrast.
CONTRAST:  8mL GADAVIST GADOBUTROL 1 MMOL/ML IV SOLN

[Series 5: DWI · axial · 3.0mm · 1.36mm/px · z∈[-31,+113]mm · 6 of 104 slices shown (1 of 2)]
[im 1/104]
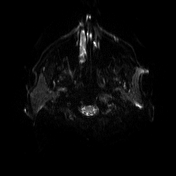
[im 21/104]
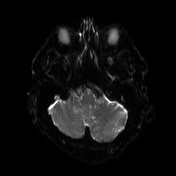
[im 42/104]
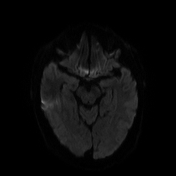
[im 62/104]
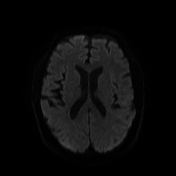
[im 83/104]
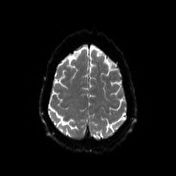
[im 104/104]
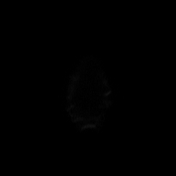

[Series 6: DWI · axial · 3.0mm · 1.36mm/px · z∈[-31,+113]mm · 3 of 52 slices shown (2 of 2)]
[im 1/52]
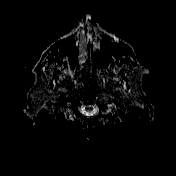
[im 26/52]
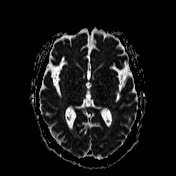
[im 52/52]
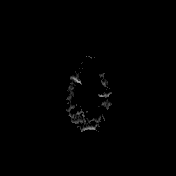

[Series 7: T1 · sagittal · 5.0mm · 0.75mm/px · 1 of 26 slices shown (1 of 4)]
[im 1/26]
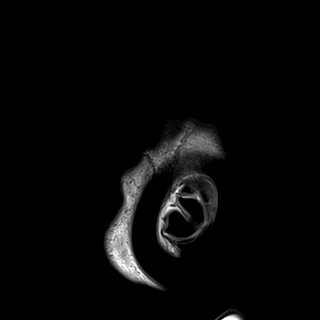

[Series 8: T2 · axial · 5.0mm · 0.62mm/px · 1 of 25 slices shown (1 of 2)]
[im 1/25]
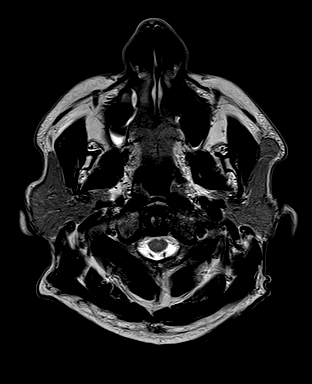

[Series 9: swi_images · axial · 3.0mm · 0.75mm/px · z∈[-38,+118]mm · 3 of 56 slices shown]
[im 1/56]
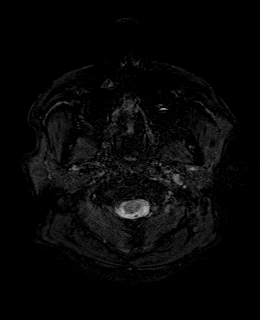
[im 28/56]
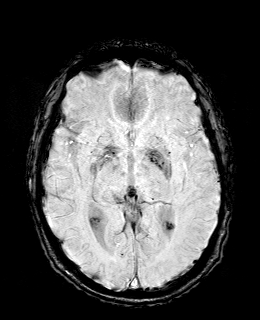
[im 56/56]
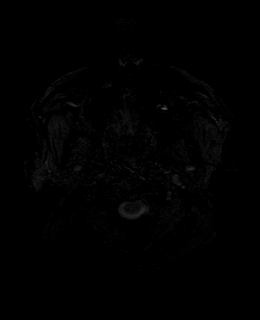

[Series 11: FLAIR · axial · 3.0mm · 0.75mm/px · z∈[-32,+113]mm · 2 of 52 slices shown]
[im 1/52]
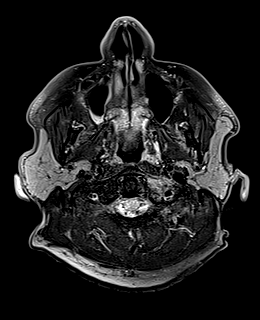
[im 52/52]
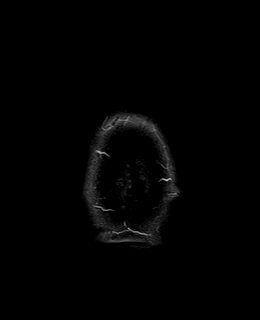

[Series 12: T1 · axial · 1.0mm · 0.94mm/px · z∈[-32,+118]mm · 7 of 160 slices shown (2 of 4)]
[im 1/160]
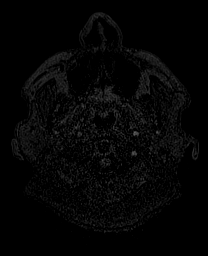
[im 27/160]
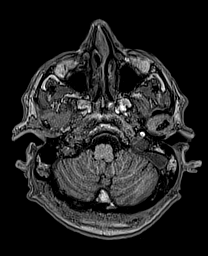
[im 54/160]
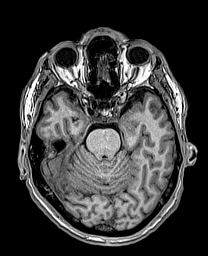
[im 80/160]
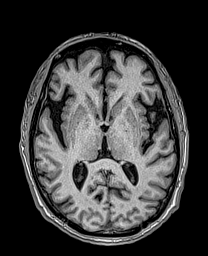
[im 107/160]
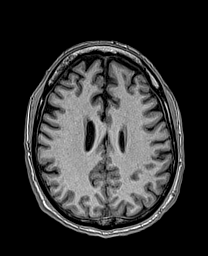
[im 133/160]
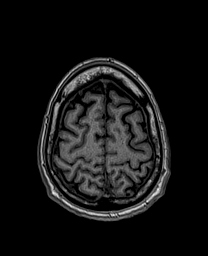
[im 160/160]
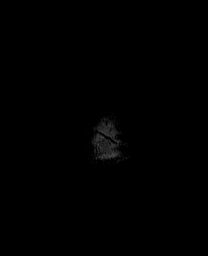

[Series 13: cor dwi_tracew · coronal · 5.0mm · 1.53mm/px · 3 of 64 slices shown]
[im 1/64]
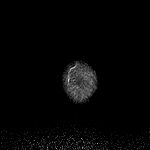
[im 32/64]
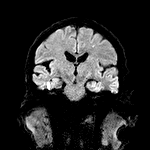
[im 64/64]
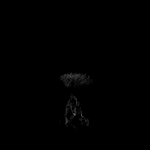

[Series 14: cor dwi_adc · coronal · 5.0mm · 1.53mm/px · 1 of 32 slices shown]
[im 1/32]
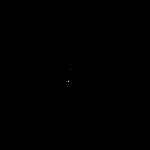

[Series 15: T2 · coronal · 5.0mm · 0.57mm/px · 1 of 32 slices shown (2 of 2)]
[im 1/32]
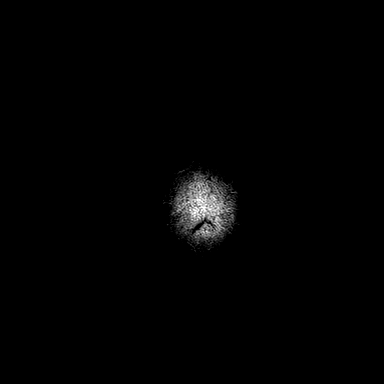

[Series 16: T1 post-contrast · axial · 1.0mm · 0.94mm/px · z∈[-32,+118]mm · 7 of 160 slices shown (1 of 3)]
[im 1/160]
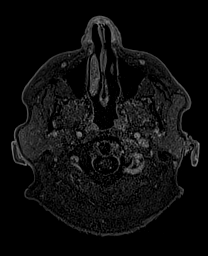
[im 27/160]
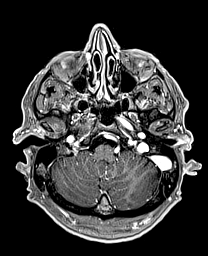
[im 54/160]
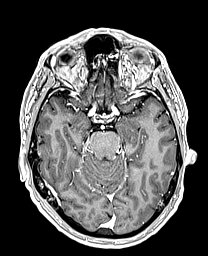
[im 80/160]
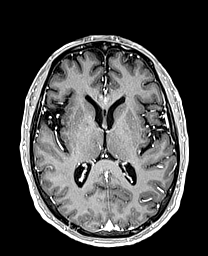
[im 107/160]
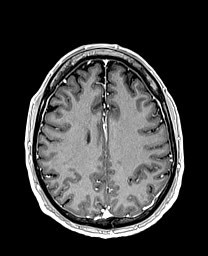
[im 133/160]
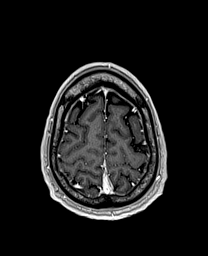
[im 160/160]
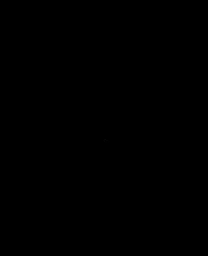

[Series 17: T1 · sagittal · 4.0mm · 0.94mm/px · 2 of 35 slices shown (3 of 4)]
[im 1/35]
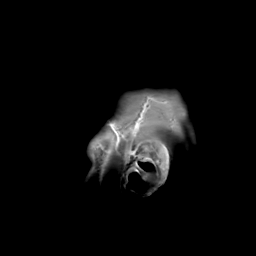
[im 35/35]
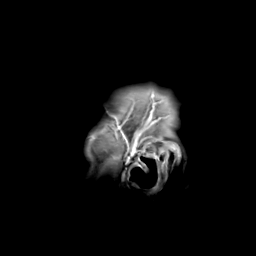

[Series 18: T1 · coronal · 4.0mm · 0.94mm/px · 2 of 44 slices shown (4 of 4)]
[im 1/44]
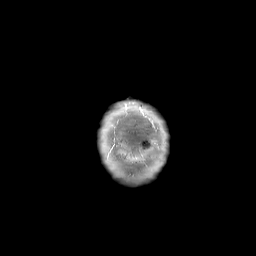
[im 44/44]
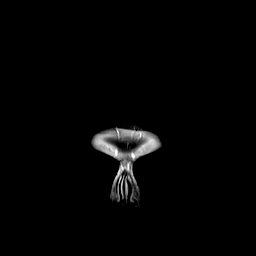

[Series 19: T1 post-contrast · coronal · 5.0mm · 0.43mm/px · 1 of 32 slices shown (2 of 3)]
[im 1/32]
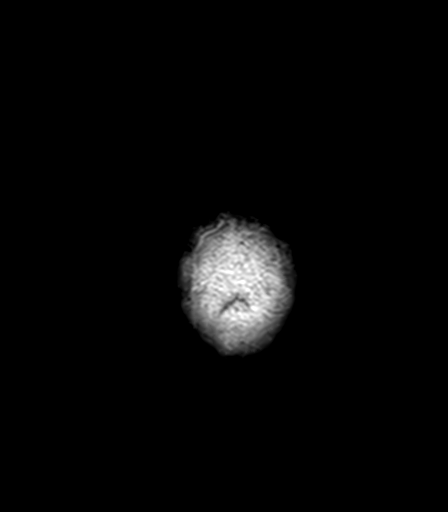

[Series 20: T1 post-contrast · sagittal · 5.0mm · 0.75mm/px · 1 of 26 slices shown (3 of 3)]
[im 1/26]
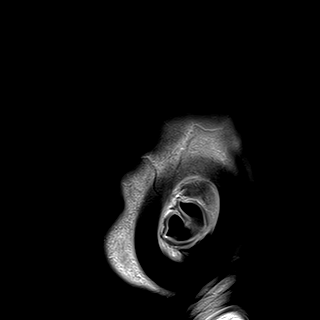

[41 of 48 positions shown; findings below may reference images not displayed]

FINDINGS: Brain: Solitary new rim enhancing lesion in the central right
cerebellum measures 9 mm diameter (series 16, image 33) with mild
regional T2 and FLAIR hyperintense edema. No significant posterior
fossa mass effect. The lesion is mildly positive on DWI.

Elsewhere punctate restricted diffusion in the left centrum
semiovale (series 5, image 91) is without associated enhancement,
but rather has punctate T2/FLAIR hyperintensity and decreased
postcontrast T1 signal. This most resembles a punctate acute lacunar
infarct.

No other abnormal diffusion or enhancement identified. No dural
thickening.

Minimal additional nonspecific cerebral white matter T2 and FLAIR
hyperintensity with no cortical encephalomalacia. No chronic
cerebral blood products identified. No midline shift,
ventriculomegaly, extra-axial collection or acute intracranial
hemorrhage. Cervicomedullary junction and pituitary are within
normal limits.

Vascular: Major intracranial vascular flow voids are stable since
last year. Dominant left vertebral artery, left transverse and
sigmoid sinuses. The major dural venous sinuses demonstrates stable
enhancement on postcontrast images.

Skull and upper cervical spine: Negative visible cervical spine and
spinal cord. Visualized bone marrow signal is within normal limits.

Sinuses/Orbits: Stable, negative.

Other: Stable mild mastoid effusions. Grossly normal visible
internal auditory structures. Negative visible scalp and face.
IMPRESSION: 1. Positive for a solitary new 9 mm rim enhancing Metastasis in the
central right cerebellum. Mild edema but no mass effect.

2. A superimposed punctate focus of nonenhancing abnormal diffusion
in the left centrum semiovale is most compatible with Recent white
matter Lacunar Infarct. Attention directed on follow-up to ensure
expected resolution.

3. No other metastatic disease or acute intracranial abnormality
identified.

## 2020-09-26 MED ORDER — GADOBUTROL 1 MMOL/ML IV SOLN
8.0000 mL | Freq: Once | INTRAVENOUS | Status: AC | PRN
  Administered 2020-09-26: 8 mL via INTRAVENOUS

## 2020-09-28 ENCOUNTER — Ambulatory Visit
Admission: RE | Admit: 2020-09-28 | Discharge: 2020-09-28 | Disposition: A | Discharge status: Home / Self Care | Payer: BC Managed Care – PPO | Source: Ambulatory Visit | Attending: Radiation Oncology | Admitting: Radiation Oncology

## 2020-09-28 ENCOUNTER — Other Ambulatory Visit: Payer: Self-pay

## 2020-09-28 DIAGNOSIS — C7931 Secondary malignant neoplasm of brain: Secondary | ICD-10-CM | POA: Diagnosis not present

## 2020-09-29 ENCOUNTER — Other Ambulatory Visit: Payer: Self-pay | Admitting: Radiation Therapy

## 2020-09-29 ENCOUNTER — Ambulatory Visit
Admission: RE | Admit: 2020-09-29 | Discharge: 2020-09-29 | Disposition: A | Discharge status: Home / Self Care | Payer: BC Managed Care – PPO | Source: Ambulatory Visit | Attending: Radiation Oncology | Admitting: Radiation Oncology

## 2020-09-29 DIAGNOSIS — C7931 Secondary malignant neoplasm of brain: Secondary | ICD-10-CM | POA: Diagnosis not present

## 2020-09-30 ENCOUNTER — Inpatient Hospital Stay (HOSPITAL_BASED_OUTPATIENT_CLINIC_OR_DEPARTMENT_OTHER): Payer: BC Managed Care – PPO | Admitting: Physician Assistant

## 2020-09-30 ENCOUNTER — Ambulatory Visit
Admission: RE | Admit: 2020-09-30 | Discharge: 2020-09-30 | Disposition: A | Discharge status: Home / Self Care | Payer: BC Managed Care – PPO | Source: Ambulatory Visit | Attending: Radiation Oncology | Admitting: Radiation Oncology

## 2020-09-30 ENCOUNTER — Inpatient Hospital Stay: Payer: BC Managed Care – PPO

## 2020-09-30 ENCOUNTER — Ambulatory Visit (INDEPENDENT_AMBULATORY_CARE_PROVIDER_SITE_OTHER): Payer: BC Managed Care – PPO | Admitting: Pulmonary Disease

## 2020-09-30 ENCOUNTER — Other Ambulatory Visit: Payer: Self-pay

## 2020-09-30 ENCOUNTER — Encounter: Payer: Self-pay | Admitting: Pulmonary Disease

## 2020-09-30 ENCOUNTER — Other Ambulatory Visit: Payer: Self-pay | Admitting: Radiation Therapy

## 2020-09-30 ENCOUNTER — Encounter: Payer: Self-pay | Admitting: Nutrition

## 2020-09-30 VITALS — BP 139/60 | HR 83 | Temp 97.7°F | Resp 17 | Ht 70.0 in | Wt 190.8 lb

## 2020-09-30 VITALS — BP 100/60 | HR 77 | Ht 70.0 in | Wt 190.0 lb

## 2020-09-30 DIAGNOSIS — R110 Nausea: Secondary | ICD-10-CM | POA: Diagnosis not present

## 2020-09-30 DIAGNOSIS — C3491 Malignant neoplasm of unspecified part of right bronchus or lung: Secondary | ICD-10-CM

## 2020-09-30 DIAGNOSIS — K5900 Constipation, unspecified: Secondary | ICD-10-CM

## 2020-09-30 DIAGNOSIS — J189 Pneumonia, unspecified organism: Secondary | ICD-10-CM | POA: Diagnosis not present

## 2020-09-30 DIAGNOSIS — C3482 Malignant neoplasm of overlapping sites of left bronchus and lung: Secondary | ICD-10-CM | POA: Diagnosis not present

## 2020-09-30 DIAGNOSIS — C7931 Secondary malignant neoplasm of brain: Secondary | ICD-10-CM | POA: Diagnosis not present

## 2020-09-30 LAB — CMP (CANCER CENTER ONLY)
ALT: 13 U/L (ref 0–44)
AST: 17 U/L (ref 15–41)
Albumin: 3.6 g/dL (ref 3.5–5.0)
Alkaline Phosphatase: 77 U/L (ref 38–126)
Anion gap: 10 (ref 5–15)
BUN: 16 mg/dL (ref 6–20)
CO2: 28 mmol/L (ref 22–32)
Calcium: 9.6 mg/dL (ref 8.9–10.3)
Chloride: 101 mmol/L (ref 98–111)
Creatinine: 0.96 mg/dL (ref 0.61–1.24)
GFR, Estimated: >60 mL/min (ref >60)
Glucose, Bld: 160 mg/dL — ABNORMAL HIGH (ref 70–99)
Potassium: 5 mmol/L (ref 3.5–5.1)
Sodium: 139 mmol/L (ref 135–145)
Total Bilirubin: 0.5 mg/dL (ref 0.3–1.2)
Total Protein: 7.1 g/dL (ref 6.5–8.1)

## 2020-09-30 LAB — CBC WITH DIFFERENTIAL (CANCER CENTER ONLY)
Abs Immature Granulocytes: 0.02 10*3/uL (ref 0.00–0.07)
Basophils Absolute: 0 10*3/uL (ref 0.0–0.1)
Basophils Relative: 1 %
Eosinophils Absolute: 0.1 10*3/uL (ref 0.0–0.5)
Eosinophils Relative: 2 %
HCT: 32.3 % — ABNORMAL LOW (ref 39.0–52.0)
Hemoglobin: 10.4 g/dL — ABNORMAL LOW (ref 13.0–17.0)
Immature Granulocytes: 1 %
Lymphocytes Relative: 7 %
Lymphs Abs: 0.3 10*3/uL — ABNORMAL LOW (ref 0.7–4.0)
MCH: 27.8 pg (ref 26.0–34.0)
MCHC: 32.2 g/dL (ref 30.0–36.0)
MCV: 86.4 fL (ref 80.0–100.0)
Monocytes Absolute: 0.2 10*3/uL (ref 0.1–1.0)
Monocytes Relative: 4 %
Neutro Abs: 3.5 10*3/uL (ref 1.7–7.7)
Neutrophils Relative %: 85 %
Platelet Count: 191 10*3/uL (ref 150–400)
RBC: 3.74 MIL/uL — ABNORMAL LOW (ref 4.22–5.81)
RDW: 15.1 % (ref 11.5–15.5)
WBC Count: 4.1 10*3/uL (ref 4.0–10.5)
nRBC: 0 % (ref 0.0–0.2)

## 2020-09-30 MED ORDER — PREDNISONE 10 MG PO TABS: 10.0000 mg | ORAL_TABLET | Freq: Every day | ORAL | 0 refills | Status: DC

## 2020-09-30 MED ORDER — ONDANSETRON HCL 8 MG PO TABS: 8.0000 mg | ORAL_TABLET | Freq: Three times a day (TID) | ORAL | 2 refills | Status: DC | PRN

## 2020-09-30 MED ORDER — LIDOCAINE-PRILOCAINE 2.5-2.5 % EX CREA: 1.0000 "application " | TOPICAL_CREAM | CUTANEOUS | 2 refills | Status: AC | PRN

## 2020-09-30 NOTE — Patient Instructions (Signed)
It is common for patients who are undergoing treatment and taking certain prescribed medications to experience side-effects with constipation.  If you experience constipation, please take stool softener such as Colace or Senna one tablet twice a day everyday to avoid constipation.  These medications are available over the counter.  Of course, if you have diarrhea, stop taking stool softeners.  Drinking plenty of fluid, eating fruits and vegetable, and being active also reduces the risk of constipation.   If despite taking stool softeners, and you still have no bowel movement for 2 days or more than your normal bowel habit frequency, please take one of the following over the counter laxatives:  MiraLax, Milk of Magnesia or Mag Citrate everyday. The goal is to have at least one bowel movement every other day.    Constipation, Adult Constipation is when a person has trouble pooping (having a bowel movement). When you have this condition, you may poop fewer than 3 times a week. Your poop (stool) may also be dry, hard, or bigger than normal. Follow these instructions at home: Eating and drinking  Eat foods that have a lot of fiber, such as: ? Fresh fruits and vegetables. ? Whole grains. ? Beans.  Eat less of foods that are low in fiber and high in fat and sugar, such as: ? [place] fries. ? Hamburgers. ? Cookies. ? Candy. ? Soda.  Drink enough fluid to keep your pee (urine) pale yellow.   General instructions  Exercise regularly or as told by your doctor. Try to do 150 minutes of exercise each week.  Go to the restroom when you feel like you need to poop. Do not hold it in.  Take over-the-counter and prescription medicines only as told by your doctor. These include any fiber supplements.  When you poop: ? Do deep breathing while relaxing your lower belly (abdomen). ? Relax your pelvic floor. The pelvic floor is a group of muscles that support the rectum, bladder, and intestines (as well  as the uterus in women).  Watch your condition for any changes. Tell your doctor if you notice any.  Keep all follow-up visits as told by your doctor. This is important. Contact a doctor if:  You have pain that gets worse.  You have a fever.  You have not pooped for 4 days.  You vomit.  You are not hungry.  You lose weight.  You are bleeding from the opening of the butt (anus).  You have thin, pencil-like poop. Get help right away if:  You have a fever, and your symptoms suddenly get worse.  You leak poop or have blood in your poop.  Your belly feels hard or bigger than normal (bloated).  You have very bad belly pain.  You feel dizzy or you faint. Summary  Constipation is when a person poops fewer than 3 times a week, has trouble pooping, or has poop that is dry, hard, or bigger than normal.  Eat foods that have a lot of fiber.  Drink enough fluid to keep your pee (urine) pale yellow.  Take over-the-counter and prescription medicines only as told by your doctor. These include any fiber supplements. This information is not intended to replace advice given to you by your health care provider. Make sure you discuss any questions you have with your health care provider. Document Revised: 02/27/2019 Document Reviewed: 02/27/2019 Elsevier Patient Education  [place].

## 2020-09-30 NOTE — Progress Notes (Signed)
Provided one complimentary case of ensure plus. 

## 2020-09-30 NOTE — Patient Instructions (Signed)
Nice to see you again  I refilled the prednisone, 10 mg every day.  Please call me if you need refills of the doxycycline or prednisone.  Give me a few days of advanced warning as sometimes I am out of the office for several days.  I plan to see you in 3 months, if things are doing well we can space this out.  I am happy to see you sooner anytime as needed.  Return to clinic in 3 months for follow-up with Dr. Silas Flood

## 2020-10-01 ENCOUNTER — Ambulatory Visit
Admission: RE | Admit: 2020-10-01 | Discharge: 2020-10-01 | Disposition: A | Discharge status: Home / Self Care | Payer: BC Managed Care – PPO | Source: Ambulatory Visit | Attending: Radiation Oncology | Admitting: Radiation Oncology

## 2020-10-01 DIAGNOSIS — C7931 Secondary malignant neoplasm of brain: Secondary | ICD-10-CM | POA: Diagnosis not present

## 2020-10-02 ENCOUNTER — Ambulatory Visit
Admission: RE | Admit: 2020-10-02 | Discharge: 2020-10-02 | Disposition: A | Discharge status: Home / Self Care | Payer: BC Managed Care – PPO | Source: Ambulatory Visit | Attending: Radiation Oncology | Admitting: Radiation Oncology

## 2020-10-02 ENCOUNTER — Other Ambulatory Visit: Payer: Self-pay

## 2020-10-02 DIAGNOSIS — C7931 Secondary malignant neoplasm of brain: Secondary | ICD-10-CM

## 2020-10-02 IMAGING — MR MR HEAD WO/W CM
13 series · 48 of 48 positions shown · IV contrast (Multihance 15ml)
Comparison: MR head without and with contrast [DATE]

CLINICAL DATA: Lung cancer. Solitary brain metastasis. Planning for
SRS.

EXAM:
MRI HEAD WITHOUT AND WITH CONTRAST
TECHNIQUE: Multiplanar, multiecho pulse sequences of the brain and surrounding
structures were obtained without and with intravenous contrast.
CONTRAST:  15mL MULTIHANCE GADOBENATE DIMEGLUMINE 529 MG/ML IV SOLN

[Series 2: FLAIR · sagittal · 3.0mm · 0.75mm/px · 1 of 38 slices shown (1 of 2)]
[im 1/38]
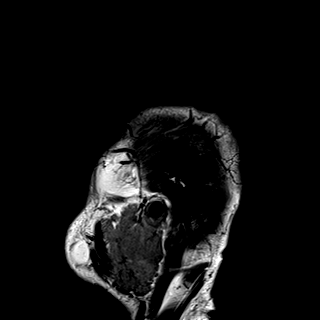

[Series 3: DWI · axial · 3.0mm · 1.50mm/px · z∈[-101,+67]mm · 4 of 88 slices shown (1 of 2)]
[im 1/88]
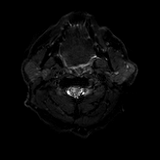
[im 30/88]
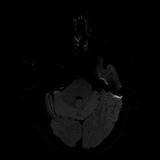
[im 59/88]
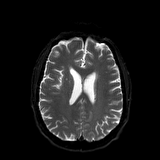
[im 88/88]
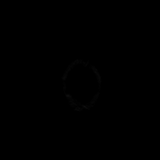

[Series 4: DWI · axial · 3.0mm · 1.50mm/px · z∈[-101,+67]mm · 2 of 43 slices shown (2 of 2)]
[im 1/43]
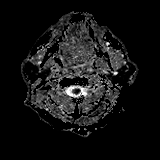
[im 43/43]
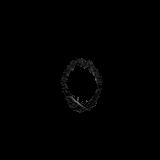

[Series 5: T2 · axial · 5.0mm · 0.57mm/px · 1 of 29 slices shown (1 of 2)]
[im 1/29]
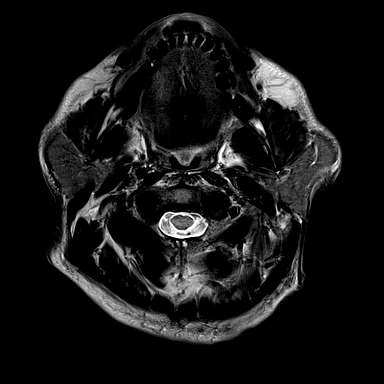

[Series 6: swi_images · axial · 1.5mm · 0.90mm/px · z∈[-93,+62]mm · 5 of 104 slices shown]
[im 1/104]
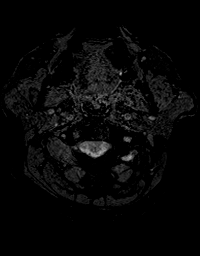
[im 26/104]
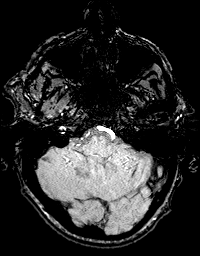
[im 52/104]
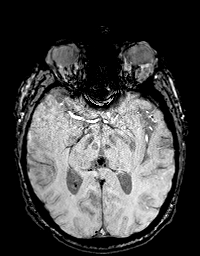
[im 78/104]
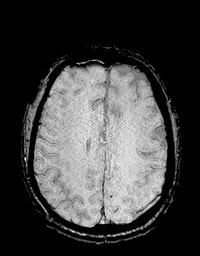
[im 104/104]
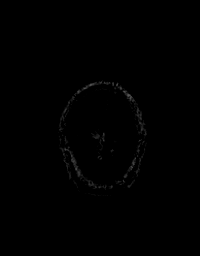

[Series 7: mip_images(sw) · axial · 12.0mm · 0.90mm/px · z∈[-87,+57]mm · 4 of 97 slices shown]
[im 1/97]
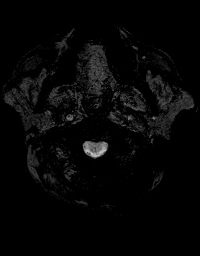
[im 33/97]
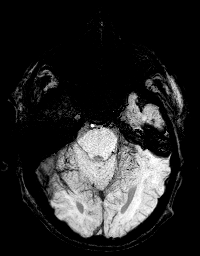
[im 65/97]
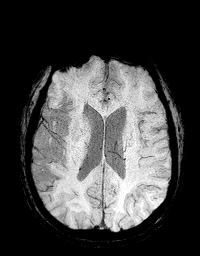
[im 97/97]
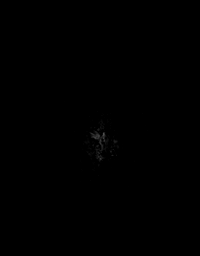

[Series 8: FLAIR · axial · 3.0mm · 0.86mm/px · z∈[-100,+65]mm · 2 of 56 slices shown (2 of 2)]
[im 1/56]
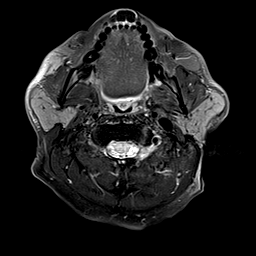
[im 56/56]
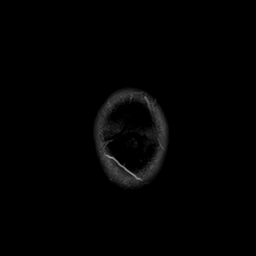

[Series 9: T2 · axial · non-contrast · 1.0mm · 0.86mm/px · z∈[-103,+69]mm · 8 of 176 slices shown (2 of 2)]
[im 1/176]
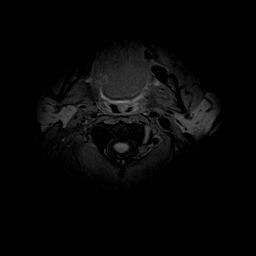
[im 26/176]
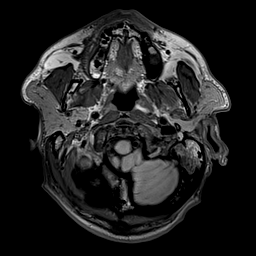
[im 51/176]
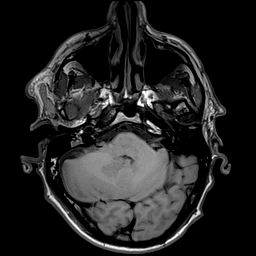
[im 76/176]
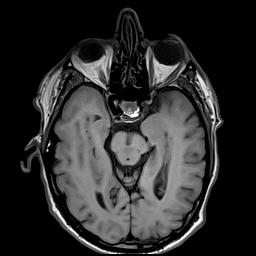
[im 101/176]
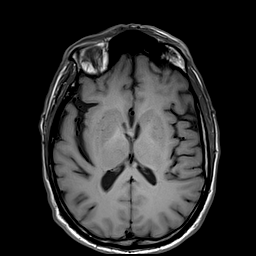
[im 126/176]
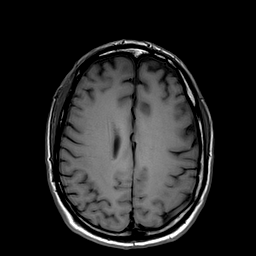
[im 151/176]
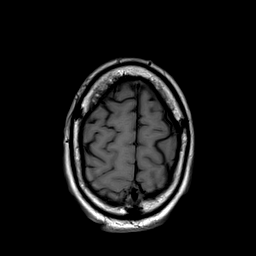
[im 176/176]
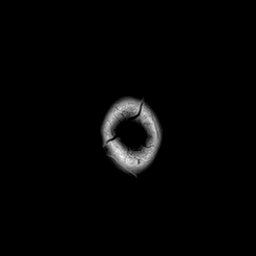

[Series 10: T2 post-contrast · coronal · 3.0mm · 0.57mm/px · 2 of 47 slices shown (1 of 2)]
[im 1/47]
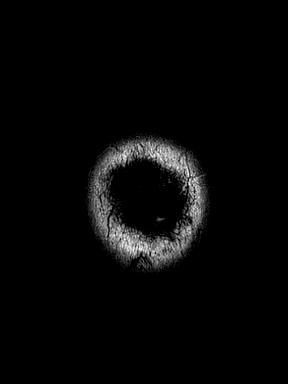
[im 47/47]
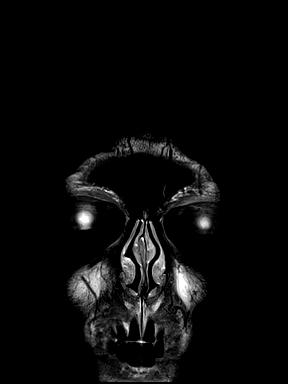

[Series 11: T2 post-contrast · axial · 1.0mm · 0.86mm/px · z∈[-103,+69]mm · 8 of 176 slices shown (2 of 2)]
[im 1/176]
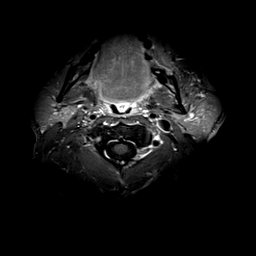
[im 26/176]
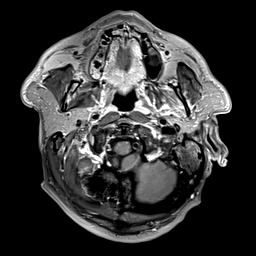
[im 51/176]
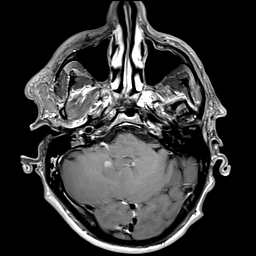
[im 76/176]
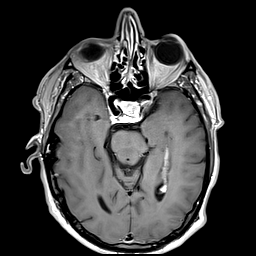
[im 101/176]
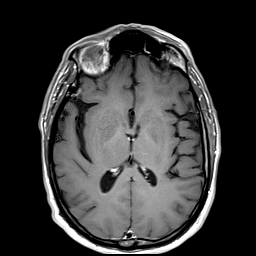
[im 126/176]
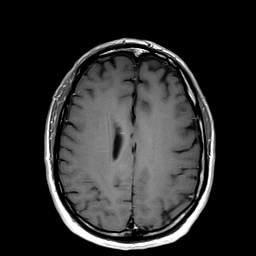
[im 151/176]
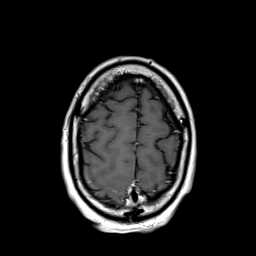
[im 176/176]
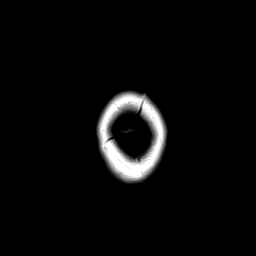

[Series 12: T1 post-contrast · axial · 1.0mm · 0.75mm/px · z∈[-96,+63]mm · 7 of 160 slices shown (1 of 2)]
[im 1/160]
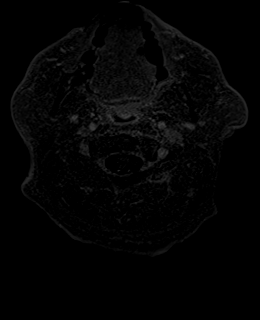
[im 27/160]
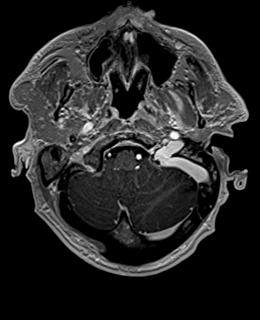
[im 54/160]
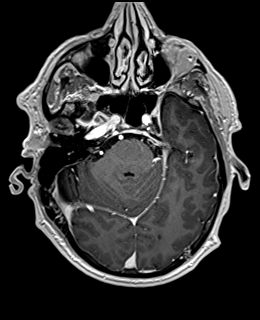
[im 80/160]
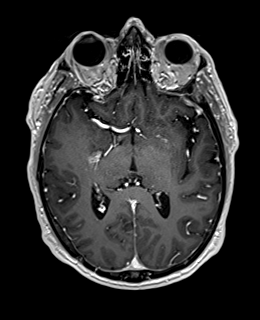
[im 107/160]
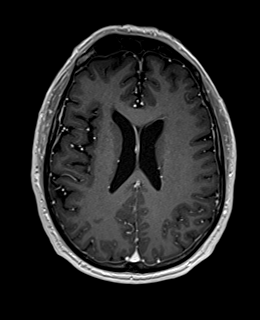
[im 133/160]
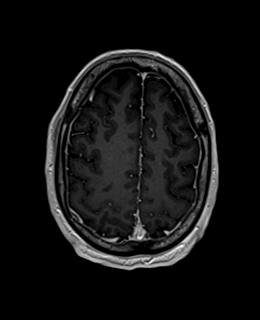
[im 160/160]
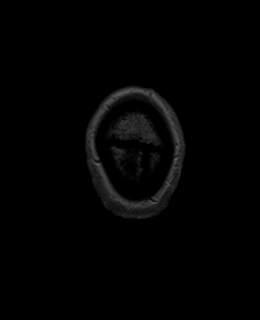

[Series 13: T1 post-contrast · coronal · 3.0mm · 0.57mm/px · 2 of 47 slices shown (2 of 2)]
[im 1/47]
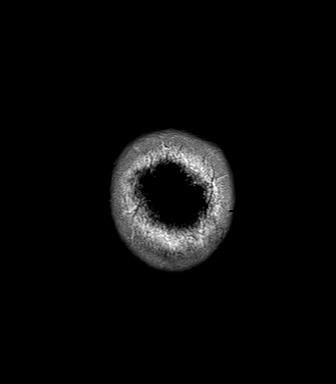
[im 47/47]
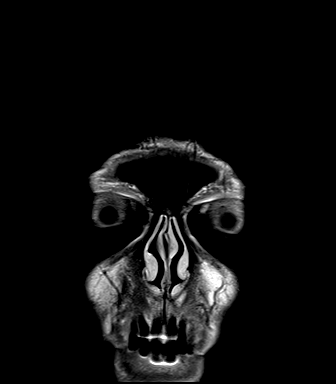

[Series 14: FLAIR post-contrast · sagittal · 3.0mm · 0.75mm/px · 2 of 39 slices shown]
[im 1/39]
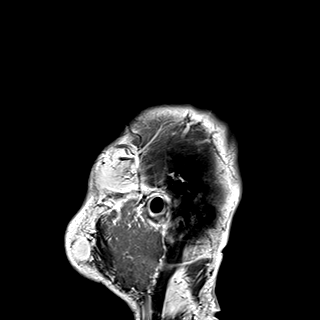
[im 39/39]
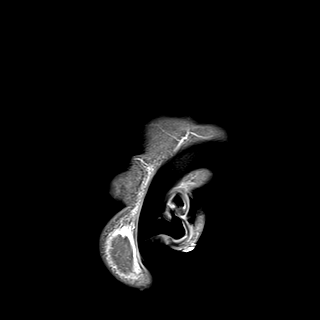

[48 of 48 positions shown; findings below may reference images not displayed]

FINDINGS: Brain: The rim enhancing inferior right cerebellar metastasis is
stable, measuring 9 x 7 x 6 mm. There is surrounding vasogenic edema
without significant mass effect.

A second punctate focus of enhancement in the right cerebellum on
image 46 of series 12 likely represents a second metastasis.

Faint enhancement is present in the left frontal lobe on image 130
of series 11. This corresponds to the area previously noted
restricted diffusion. The effusion is no longer restricted. This
likely represents a remote lacunar infarct rather than a metastasis.

A cortical focus of enhancement is present on image 136 of series 11
and 125 of series 12 consistent with a 3 mm lesion in the posterior
left frontal lobe.

No acute infarct or hemorrhage is present. No other significant
white matter disease is present. The ventricles are of normal size.
No significant extraaxial fluid collection is present.

The internal auditory canals are within normal limits. The brainstem
and cerebellum are otherwise within normal limits.

Vascular: Flow is present in the major intracranial arteries.

Skull and upper cervical spine: The craniocervical junction is
normal. Upper cervical spine is within normal limits. Marrow signal
is unremarkable.

Sinuses/Orbits: A polyp or mucous retention cyst is present in the
inferior left maxillary sinus. The paranasal sinuses and mastoid air
cells are otherwise clear. The globes and orbits are within normal
limits.
IMPRESSION: 1. Stable 9 x 7 x 6 mm rim enhancing metastasis in the inferior
right cerebellar hemisphere with surrounding vasogenic edema but no
significant mass effect.
2. Two additional foci of enhancement consistent with additional
mets.
3. Additional punctate focus of enhancement in the right cerebellum
likely represents a second metastasis.
4. 3 mm cortical focus of enhancement in the posterior left frontal
lobe consistent with a metastasis.
5. Probable remote lacunar infarct of the left frontal lobe.

## 2020-10-02 MED ORDER — GADOBENATE DIMEGLUMINE 529 MG/ML IV SOLN
15.0000 mL | Freq: Once | INTRAVENOUS | Status: AC | PRN
  Administered 2020-10-02: 15 mL via INTRAVENOUS

## 2020-10-05 ENCOUNTER — Other Ambulatory Visit: Payer: Self-pay

## 2020-10-05 ENCOUNTER — Ambulatory Visit
Admission: RE | Admit: 2020-10-05 | Discharge: 2020-10-05 | Disposition: A | Discharge status: Home / Self Care | Payer: BC Managed Care – PPO | Source: Ambulatory Visit | Attending: Radiation Oncology | Admitting: Radiation Oncology

## 2020-10-05 DIAGNOSIS — C7931 Secondary malignant neoplasm of brain: Secondary | ICD-10-CM | POA: Diagnosis not present

## 2020-10-05 NOTE — Progress Notes (Signed)
Histology and Location of Primary Cancer: Stage IIB (T3, N0, M0) non-small cell lung cancer (Metastatic non-small cell lung cancer)  Location(s) of Symptomatic tumor(s):  10/02/2020 --MRI Brain w/ and w/o Contrast IMPRESSION: 1. Stable 9 x 7 x 6 mm rim enhancing metastasis in the inferior right cerebellar hemisphere with surrounding vasogenic edema but no significant mass effect. 2. Two additional foci of enhancement consistent with additional mets. 3. Additional punctate focus of enhancement in the right cerebellum likely represents a second metastasis. 4. 3 mm cortical focus of enhancement in the posterior left frontal lobe consistent with a metastasis. 5. 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  Past/Anticipated chemotherapy by medical oncology, if any:  Under care of Dr. Curt Bears  09/30/2020 (Cassie Heilingoetter-PA)  --He originally presented with a perihilar right upper lobe lung mass and suspicious groundglass opacity in the right middle lobe.   --He had evidence of metastatic disease in March/ May 2022 with a biopsy-proven metastatic lesion in the right kidney as well as right pleural space, liver, thoracic/abdominal lymph nodes, and bones.   --There is also a probable left-sided pleural metastasis.   --The patient's PD-L1 expression is 80%.   --He is negative for any actionable mutations by guardant 360. --The patient was not a good candidate for resection according to Dr. Roxan Hockey. 1) Palliative systemic chemotherapy with carboplatin AUC of 5, Alimta 500 mg per metered squared, Keytruda 200 mg IV every 3 weeks.  First dose expected on 09/24/2020. Status post 1 cycle.  2) Palliative radiation to the left anterior rib cage area as well as the mid back area under the care of Dr. Sondra Come. Last treatment expected on 10/05/20.   Patient's main complaints related to symptomatic tumor(s) are: Reports lingering pain to his mid-lower back (over kidneys) and left upper  ribs/side. Without pain medication he reports these areas are a 6-7 out of 10. States the pain inhibits him from doing certain activities he enjoys  Recent neurologic symptoms, if any:  Seizures: Patient denies Headaches: Reports occasional headache on the sides of his head (2 out of 10 pain). States they resolve on their own Nausea: Reports occasional nausea, but is able to manage with PRN medication. Reports a healthy appetite Dizziness/ataxia: Patient denies Difficulty with hand coordination: Patient denies, but does report it takes him longer to do fine motor skills (picking up a pen, buttoning a shirt, etc) and he tires more quickly Focal numbness/weakness: States his right foot often "falls asleep". Reports it will resolve after some time, but then returns ~30-40 min later Visual deficits/changes: Patient denies Confusion/Memory deficits: Patient denies  Pain on a scale of 0-10 is: 6-7 out of 10 without pain medication    Current Decadron regimen, if applicable: Not currently taking, but does have a prednisone prescription (10 mg daily with breakfast). Was prescribed by his pulmonologist Dr. Larey Days [facility])  Ambulatory status? Walker? Wheelchair?: Patient is able to ambulate unassisted  SAFETY ISSUES: Prior radiation? Yes 09/22/2020--10/05/2020  02/13/2020-03/24/2020: 45 of 45 (Gy) delivered, 25 of 25 Fx delivered  Pacemaker/ICD? No Possible current pregnancy? N/A Is the patient on methotrexate? No  Additional Complaints / other details:  Patient reports he has developed a rash to both his feet and up his right lower extremity. States it seemed to start around the same time he start PRN pain medication (oxycodone) and first cycle of systemic therapy

## 2020-10-05 NOTE — Progress Notes (Signed)
Radiation Oncology         [phone] ________________________________  Outpatient Re-Consultation  Name: IVAR DOMANGUE MRN: [id number]  Date: 10/06/2020  DOB: 05/25/1960  FU:[id number], Einar Pheasant, DO  Curt Bears, MD   REFERRING PHYSICIAN: Curt Bears, MD  DIAGNOSIS:  STAGE IV non-small cell lung cancer, adenocarcinoma with Brain metastases     ICD-10-CM   1. Metastasis to brain [facility])  C79.31     2. Adenocarcinoma of right lung, stage 4 (HCC)  C34.91 CBC with Differential ([place])    CMP ([place] only)    3. Brain metastases [facility]  C79.31        HISTORY OF PRESENT ILLNESS::Melvyn Viona Gilmore Beckford is a 60 y.o. male who who initially presented with non-small cell lung cancer diagnosed in September 2021. The patient initially received radiation therapy under Dr. Sondra Come from 02/13/20 to 03/14/20.   Chest CT received by the patient on 04/17/20 showed the cavitary lung cancer over the central right upper lobe had grown, with the cavitary component measuring 6 cm at the largest measurement (previously measuring 2 x 4.1 x 2.7 cm).  The patient was admitted to ED on 01/14 2022 reporting left-sided pleuritic chest pain worsening with cough and chest movement and dyspnea on exertion. He denied any coughing up blood fever or chills. Chest x-ray taken showed concerning increasing right upper lobe infiltration for pneumonia. CTA taken was negative for PE, but showed persistent infiltrates consolidation on the right mid and upper lobe.  He had evidence of metastatic disease with a biopsy-proven metastatic lesion in the right kidney on on 08/20/20.  Patient received PET scan on 09/10/2020 showing probable left-sided pleural metastasis as well as suspected mild enlargement of a left lower lobe nodule, also suspicious for metastatic disease.  PET scan was also significant for osseous metastasis.  Subsequently, the patient saw Dr. Sondra Come on 09/16/20 for consideration of  palliative radiation treatment. The patient received radiation treatment from 09/22/20-10/05/20.   MRI of the brain on 09/26/20 demonstrated a solitary new 9 mm rim enhancing Metastasis in the central right cerebellum with mild edema (but no mass effect).   Follow-up SRS protocol 3 Tesla MRI taken on 10/02/20 showed Stable 9 x 7 x 6 mm rim enhancing metastasis in the inferior right cerebellar hemisphere with surrounding vasogenic edema. Additional punctate focus of enhancement was seen in the right cerebellum noted to likely represent a second metastasis, as well as a 3 mm cortical focus of enhancement in the posterior left frontal lobe consistent with a metastasis.  I have personally reviewed his images with our CNS tumor board.  The consensus was that he is a good candidate for stereotactic radiosurgery.   Patient's main complaints related to symptomatic tumor(s) are: Reports lingering pain to his mid-lower back (over kidneys) and left upper ribs/side. Without pain medication he reports these areas are a 6-7 out of 10. States the pain inhibits him from doing certain activities he enjoys  Recent neurologic symptoms, if any:  Seizures: Patient denies Headaches: Reports occasional headache on the sides of his head (2 out of 10 pain). States they resolve on their own Nausea: Reports occasional nausea, but is able to manage with PRN medication. Reports a healthy appetite Dizziness/ataxia: Patient denies Difficulty with hand coordination: Patient denies, but does report it takes him longer to do fine motor skills (picking up a pen, buttoning a shirt, etc) and he tires more quickly Focal numbness/weakness: States his right foot often "falls asleep".  Reports it will resolve after some time, but then returns ~30-40 min later Visual deficits/changes: Patient denies Confusion/Memory deficits: Patient denies  Pain on a scale of 0-10 is: 6-7 out of 10 without pain medication    Current Decadron regimen,  if applicable: Not currently taking, but does have a prednisone prescription (10 mg daily with breakfast). Was prescribed by his pulmonologist Dr. Larey Days [facility])  Ambulatory status? Walker? Wheelchair?: Patient is able to ambulate unassisted  SAFETY ISSUES: Prior radiation? Yes 09/22/2020--10/05/2020  02/13/2020-03/24/2020: 45 of 45 (Gy) delivered, 25 of 25 Fx delivered  Pacemaker/ICD? No Possible current pregnancy? N/A Is the patient on methotrexate? No  Additional Complaints / other details:  Patient reports he has developed a rash to both his feet and up his right lower extremity. States it seemed to start around the same time he start PRN pain medication (oxycodone) and first cycle of systemic therapy   PREVIOUS RADIATION THERAPY: Yes; 02/13/20 to 03/14/20 and 09/22/20 to 10/05/20 as above  PAST MEDICAL HISTORY:  has a past medical history of Allergic rhinitis, cause unspecified, Anxiety state, unspecified, Asthma, Chronic airway obstruction, not elsewhere classified, Esophageal reflux, History of kidney stones, History of radiation therapy (02/13/2020-03/24/2020), Hypertension, Lumbago, lung ca (dx'd 12/2019), Other and unspecified hyperlipidemia, and Other chest pain.    PAST SURGICAL HISTORY: Past Surgical History:  Procedure Laterality Date   APPENDECTOMY     BRONCHIAL BIOPSY  01/07/2020   Procedure: BRONCHIAL BIOPSIES;  Surgeon: Collene Gobble, MD;  Location: [facility] ENDOSCOPY;  Service: Pulmonary;;   BRONCHIAL BIOPSY  04/05/2020   Procedure: BRONCHIAL BIOPSIES;  Surgeon: Rigoberto Noel, MD;  Location: [place];  Service: Cardiopulmonary;;   BRONCHIAL BRUSHINGS  01/07/2020   Procedure: BRONCHIAL BRUSHINGS;  Surgeon: Collene Gobble, MD;  Location: [facility] ENDOSCOPY;  Service: Pulmonary;;   BRONCHIAL NEEDLE ASPIRATION BIOPSY  01/07/2020   Procedure: BRONCHIAL NEEDLE ASPIRATION BIOPSIES;  Surgeon: Collene Gobble, MD;  Location: [facility] ENDOSCOPY;  Service: Pulmonary;;    BRONCHIAL WASHINGS  01/07/2020   Procedure: BRONCHIAL WASHINGS;  Surgeon: Collene Gobble, MD;  Location: [facility] ENDOSCOPY;  Service: Pulmonary;;   BRONCHIAL WASHINGS  04/05/2020   Procedure: BRONCHIAL WASHINGS;  Surgeon: Rigoberto Noel, MD;  Location: [place];  Service: Cardiopulmonary;;   COLONOSCOPY  15 years ago   HEMOSTASIS CONTROL  01/07/2020   Procedure: HEMOSTASIS CONTROL;  Surgeon: Collene Gobble, MD;  Location: [facility] ENDOSCOPY;  Service: Pulmonary;;  cold saline   IR IMAGING GUIDED PORT INSERTION  09/18/2020   NASAL TURBINATE REDUCTION  2002   Dr.Crossley   VASECTOMY     VIDEO BRONCHOSCOPY Right 04/05/2020   Procedure: VIDEO BRONCHOSCOPY WITH FLUORO;  Surgeon: Rigoberto Noel, MD;  Location: [place];  Service: Cardiopulmonary;  Laterality: Right;   VIDEO BRONCHOSCOPY WITH ENDOBRONCHIAL NAVIGATION N/A 01/07/2020   Procedure: VIDEO BRONCHOSCOPY WITH ENDOBRONCHIAL NAVIGATION;  Surgeon: Collene Gobble, MD;  Location: [place] ENDOSCOPY;  Service: Pulmonary;  Laterality: N/A;   VIDEO BRONCHOSCOPY WITH ENDOBRONCHIAL ULTRASOUND N/A 01/07/2020   Procedure: VIDEO BRONCHOSCOPY WITH ENDOBRONCHIAL ULTRASOUND;  Surgeon: Collene Gobble, MD;  Location: [place] ENDOSCOPY;  Service: Pulmonary;  Laterality: N/A;    FAMILY HISTORY: family history includes Breast cancer in his mother; COPD in his maternal uncle; Cancer in his father; Emphysema in his maternal uncle; Heart attack in his brother; Heart disease in his maternal uncle.  SOCIAL HISTORY:  reports that he quit smoking about 15 years ago. His smoking use included cigarettes. He has  a 56.00 pack-year smoking history. He has never used smokeless tobacco. He reports current alcohol use of about 12.0 standard drinks of alcohol per week. He reports that he does not use drugs.  ALLERGIES: Amoxicillin  MEDICATIONS:  Current Outpatient Medications  Medication Sig Dispense Refill   acetaminophen (TYLENOL) 500 MG tablet Take 1,000 mg by mouth 2 (two) times  daily.     Brimonidine Tartrate (LUMIFY) 0.025 % SOLN Place 1 drop into both eyes 2 (two) times a week. As needed     dextromethorphan-guaiFENesin (MUCINEX DM) 30-600 MG 12hr tablet Take 1 tablet by mouth 2 (two) times daily.     doxycycline (VIBRA-TABS) 100 MG tablet Take 1 tablet (100 mg total) by mouth 2 (two) times daily. 60 tablet 1   feeding supplement (ENSURE ENLIVE / ENSURE PLUS) LIQD Take 237 mLs by mouth 3 (three) times daily between meals. (Patient taking differently: Take 237 mLs by mouth 2 (two) times daily between meals.) 237 mL 12   fenofibrate 160 MG tablet Take 1 tablet by mouth once daily (Patient taking differently: Take 160 mg by mouth daily.) 90 tablet 3   ferrous sulfate 325 (65 FE) MG tablet Take 1 tablet (325 mg total) by mouth daily with breakfast. 30 tablet 0   folic acid (FOLVITE) 1 MG tablet Take 1 tablet (1 mg total) by mouth daily. 30 tablet 2   Ipratropium-Albuterol (COMBIVENT RESPIMAT) 20-100 MCG/ACT AERS respimat Inhale 1-2 puffs into the lungs every 6 (six) hours as needed for wheezing. 4 g 3   lidocaine-prilocaine (EMLA) cream Apply 1 application topically as needed. 30 g 2   LORazepam (ATIVAN) 1 MG tablet Take 1 tablet (1 mg total) by mouth See admin instructions. Take one tablet (1 mg) by mouth daily at bedtime, may also take one tablet (1 mg) twice during the day as needed for anxiety (Patient taking differently: Take 1 mg by mouth 3 (three) times daily. Take one tablet (1 mg) by mouth daily at bedtime, may also take one tablet (1 mg) twice during the day as needed for anxiety) 90 tablet 2   meclizine (ANTIVERT) 25 MG tablet Take 1 tablet (25 mg total) by mouth every 6 (six) hours as needed for dizziness. 60 tablet 0   Multiple Vitamin (MULTIVITAMIN WITH MINERALS) TABS tablet Take 1 tablet by mouth daily.     ondansetron (ZOFRAN) 8 MG tablet Take 1 tablet (8 mg total) by mouth every 8 (eight) hours as needed for nausea or vomiting. Starting day 3 after chemotherapy  30 tablet 2   oxyCODONE-acetaminophen (PERCOCET/ROXICET) 5-325 MG tablet Take 1 tablet by mouth every 6 (six) hours as needed for severe pain. 30 tablet 0   predniSONE (DELTASONE) 10 MG tablet Take 1 tablet (10 mg total) by mouth daily with breakfast. 90 tablet 0   prochlorperazine (COMPAZINE) 10 MG tablet Take 1 tablet (10 mg total) by mouth every 6 (six) hours as needed. 30 tablet 2   No current facility-administered medications for this encounter.   Facility-Administered Medications Ordered in Other Encounters  Medication Dose Route Frequency Provider Last Rate Last Admin   0.9 %  sodium chloride infusion   Intravenous Continuous Curt Bears, MD   Stopped at 03/05/20 1410    REVIEW OF SYSTEMS:  As above.   PHYSICAL EXAM:  vitals were not taken for this visit.    Vitals - 1 value per visit 5/39/7673  SYSTOLIC 419  DIASTOLIC 65  Pulse 379  Temperature 97.9  Respirations 18  Weight (lb) 189.2  Height 5\' 10"   BMI 27.15  VISIT REPORT    General: Alert and oriented, in no acute distress   HEENT: Head is normocephalic. Extraocular movements are intact. Oropharynx is clear. Heart: Slightly tachycardic and regular in rhythm with no murmurs, rubs, or gallops. Chest: grossly clear to auscultation bilaterally, with no rhonchi, wheezes, or rales.  Skin: Faint erythematous rash to the right lower leg Musculoskeletal: symmetric strength and muscle tone throughout. Neurologic: Cranial nerves II through XII are grossly intact. No obvious focalities. Speech is fluent. Coordination is intact. Psychiatric: Judgment and insight are intact. Affect is appropriate. KPS = 80  100 - Normal; no complaints; no evidence of disease. 90   - Able to carry on normal activity; minor signs or symptoms of disease. 80   - Normal activity with effort; some signs or symptoms of disease. 52   - Cares for self; unable to carry on normal activity or to do active work. 60   - Requires occasional assistance,  but is able to care for most of his personal needs. 50   - Requires considerable assistance and frequent medical care. 68   - Disabled; requires special care and assistance. 18   - Severely disabled; hospital admission is indicated although death not imminent. 32   - Very sick; hospital admission necessary; active supportive treatment necessary. 10   - Moribund; fatal processes progressing rapidly. 0     - Dead  Karnofsky DA, [place], [place] and [place] [phone]) The use of the nitrogen mustards in the palliative treatment of carcinoma: with particular reference to bronchogenic carcinoma Cancer 1 634-56   LABORATORY DATA:  Lab Results  Component Value Date   WBC 4.1 09/30/2020   HGB 10.4 (L) 09/30/2020   HCT 32.3 (L) 09/30/2020   MCV 86.4 09/30/2020   PLT 191 09/30/2020   CMP     Component Value Date/Time   NA 139 09/30/2020 1425   K 5.0 09/30/2020 1425   CL 101 09/30/2020 1425   CO2 28 09/30/2020 1425   GLUCOSE 160 (H) 09/30/2020 1425   BUN 16 09/30/2020 1425   CREATININE 0.96 09/30/2020 1425   CREATININE 0.67 (L) 06/24/2020 1517   CALCIUM 9.6 09/30/2020 1425   PROT 7.1 09/30/2020 1425   ALBUMIN 3.6 09/30/2020 1425   AST 17 09/30/2020 1425   ALT 13 09/30/2020 1425   ALKPHOS 77 09/30/2020 1425   BILITOT 0.5 09/30/2020 1425   GFRNONAA >60 09/30/2020 1425   GFRNONAA 105 06/24/2020 1517   GFRAA 122 06/24/2020 1517         RADIOGRAPHY: MR Brain W Wo Contrast  Result Date: 10/03/2020 CLINICAL DATA:  Lung cancer. Solitary brain metastasis. Planning for SRS. EXAM: MRI HEAD WITHOUT AND WITH CONTRAST TECHNIQUE: Multiplanar, multiecho pulse sequences of the brain and surrounding structures were obtained without and with intravenous contrast. CONTRAST:  83mL MULTIHANCE GADOBENATE DIMEGLUMINE 529 MG/ML IV SOLN COMPARISON:  MR head without and with contrast 09/26/2020 FINDINGS: Brain: The rim enhancing inferior right cerebellar metastasis is stable, measuring 9 x 7 x 6 mm.  There is surrounding vasogenic edema without significant mass effect. A second punctate focus of enhancement in the right cerebellum on image 46 of series 12 likely represents a second metastasis. Faint enhancement is present in the left frontal lobe on image 130 of series 11. This corresponds to the area previously noted restricted diffusion. The effusion is no longer restricted. This likely represents a remote lacunar infarct rather  than a metastasis. A cortical focus of enhancement is present on image 136 of series 11 and 125 of series 12 consistent with a 3 mm lesion in the posterior left frontal lobe. No acute infarct or hemorrhage is present. No other significant white matter disease is present. The ventricles are of normal size. No significant extraaxial fluid collection is present. The internal auditory canals are within normal limits. The brainstem and cerebellum are otherwise within normal limits. Vascular: Flow is present in the major intracranial arteries. Skull and upper cervical spine: The craniocervical junction is normal. Upper cervical spine is within normal limits. Marrow signal is unremarkable. Sinuses/Orbits: A polyp or mucous retention cyst is present in the inferior left maxillary sinus. The paranasal sinuses and mastoid air cells are otherwise clear. The globes and orbits are within normal limits. IMPRESSION: 1. Stable 9 x 7 x 6 mm rim enhancing metastasis in the inferior right cerebellar hemisphere with surrounding vasogenic edema but no significant mass effect. 2. Two additional foci of enhancement consistent with additional mets. 3. Additional punctate focus of enhancement in the right cerebellum likely represents a second metastasis. 4. 3 mm cortical focus of enhancement in the posterior left frontal lobe consistent with a metastasis. 5. 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 Electronically Signed   By: San Morelle M.D.   On: 10/03/2020 12:18   MR Brain W Wo  Contrast  Result Date: 09/27/2020 CLINICAL DATA:  60 year old male with metastatic non-small cell lung cancer. Restaging. EXAM: MRI HEAD WITHOUT AND WITH CONTRAST TECHNIQUE: Multiplanar, multiecho pulse sequences of the brain and surrounding structures were obtained without and with intravenous contrast. CONTRAST:  50mL GADAVIST GADOBUTROL 1 MMOL/ML IV SOLN COMPARISON:  Brain MRI 01/25/2020. FINDINGS: Brain: Solitary new rim enhancing lesion in the central right cerebellum measures 9 mm diameter (series 16, image 33) with mild regional T2 and FLAIR hyperintense edema. No significant posterior fossa mass effect. The lesion is mildly positive on DWI. Elsewhere punctate restricted diffusion in the left centrum semiovale (series 5, image 91) is without associated enhancement, but rather has punctate T2/FLAIR hyperintensity and decreased postcontrast T1 signal. This most resembles a punctate acute lacunar infarct. No other abnormal diffusion or enhancement identified. No dural thickening. Minimal additional nonspecific cerebral white matter T2 and FLAIR hyperintensity with no cortical encephalomalacia. No chronic cerebral blood products identified. No midline shift, ventriculomegaly, extra-axial collection or acute intracranial hemorrhage. Cervicomedullary junction and pituitary are within normal limits. Vascular: Major intracranial vascular flow voids are stable since last year. Dominant left vertebral artery, left transverse and sigmoid sinuses. The major dural venous sinuses demonstrates stable enhancement on postcontrast images. Skull and upper cervical spine: Negative visible cervical spine and spinal cord. Visualized bone marrow signal is within normal limits. Sinuses/Orbits: Stable, negative. Other: Stable mild mastoid effusions. Grossly normal visible internal auditory structures. Negative visible scalp and face. IMPRESSION: 1. Positive for a solitary new 9 mm rim enhancing Metastasis in the central right  cerebellum. Mild edema but no mass effect. 2. A superimposed punctate focus of nonenhancing abnormal diffusion in the left centrum semiovale is most compatible with Recent white matter Lacunar Infarct. Attention directed on follow-up to ensure expected resolution. 3. No other metastatic disease or acute intracranial abnormality identified. Electronically Signed   By: Genevie Ann M.D.   On: 09/27/2020 08:26   NM PET Image Restag (PS) Skull Base To Thigh  Result Date: 09/11/2020 CLINICAL DATA:  Subsequent treatment strategy for non-small-cell lung cancer diagnosed last September. Radiation  therapy and chemotherapy complete. EXAM: NUCLEAR MEDICINE PET SKULL BASE TO THIGH TECHNIQUE: mCi F-18 FDG was injected intravenously. Full-ring PET imaging was performed from the skull base to thigh after the radiotracer. CT data was obtained and used for attenuation correction and anatomic localization. Fasting blood glucose:  mg/dl COMPARISON:  Chest CT of 07/20/2020. Abdominal MRI of 06/27/2020. Most recent PET of 01/16/2020. FINDINGS: Mediastinal blood pool activity: SUV max 2.3 Liver activity: SUV max NA NECK: No cervical nodal hypermetabolism. Lower cervical left-sided scalene muscular hypermetabolism is likely due to motion after radiopharmaceutical. Incidental CT findings: No cervical adenopathy. CHEST: Circumferential right apical pleural thickening with low-level hypermetabolism is is suspicious but may be partially treatment related. However, new areas of extensive right-sided pleural irregularity and hypermetabolism more inferiorly are consistent with metastatic disease. Example at 7 mm and a S.U.V. max of 7.2 on 103/4. Hypermetabolism along the right cardiac border is favored to be due to medial pleural disease including at a S.U.V. max of 11.2 on image 100/4. Right paratracheal node measures 6 mm and a S.U.V. max of 5.0 on 75/4. Probable left-sided pleural disease is evidenced by hypermetabolism which is favored to be  pleural rather than arising from the adjacent rib. Example at a S.U.V. max of 5.3 on 85/4. Incidental CT findings: Aortic atherosclerosis. Moderate centrilobular emphysema. Consolidation and cavitation in the right apex at the site of treated tumor. Index left lower lobe pulmonary nodule of 7 mm on 43/8 measured 6 mm on 07/20/2020. ABDOMEN/PELVIS: New metastasis within the high left hepatic lobe at 1.4 cm and a S.U.V. max of 12.0 on 118/4 . The previously detailed inter/upper pole right renal mass is hypermetabolic, on the order of 3.3 cm and a S.U.V. max of 13.0 on 139/4. Retrocaval node measures 7 mm and a S.U.V. max of 8.8 on 141/4. Incidental CT findings: Abdominal aortic atherosclerosis. Mild prostatomegaly. SKELETON: Widespread osseous metastasis. Index T12 lesion measures a S.U.V. max of 16.7. A right acetabular lesion is relatively CT occult at a S.U.V. max of 9.7 on 195/4. T11 spinous process lesion measures a S.U.V. max of 8.9. Metastasis involving the left C1 arch is relatively CT occult at a S.U.V. max of 7.7 on 28/4. Incidental CT findings: none IMPRESSION: 1. Widespread hypermetabolic metastatic disease, including to the right pleural space, liver, thoracoabdominal nodes, bones. Probable left-sided pleural metastasis as well. 2. Pulmonary nodules which are below PET resolution. Suspect mild enlargement of a left lower lobe nodule, also suspicious for metastatic disease. 3. Aortic atherosclerosis (ICD10-I70.0) and emphysema (ICD10-J43.9). Electronically Signed   By: Abigail Miyamoto M.D.   On: 09/11/2020 09:15   IR IMAGING GUIDED PORT INSERTION  Result Date: 09/18/2020 INDICATION: 60 year old male with history of non-small cell lung cancer requiring central venous access for chemotherapy. EXAM: IMPLANTED PORT A CATH PLACEMENT WITH ULTRASOUND AND FLUOROSCOPIC GUIDANCE COMPARISON:  None. MEDICATIONS: None. ANESTHESIA/SEDATION: Moderate (conscious) sedation was employed during this procedure. A total of  Versed 2 mg and Fentanyl 100 mcg was administered intravenously. Moderate Sedation Time: 16 minutes. The patient's level of consciousness and vital signs were monitored continuously by radiology nursing throughout the procedure under my direct supervision. CONTRAST:  None FLUOROSCOPY TIME:  0 minutes, 24 seconds (2 mGy) COMPLICATIONS: None immediate. PROCEDURE: The procedure, risks, benefits, and alternatives were explained to the patient. Questions regarding the procedure were encouraged and answered. The patient understands and consents to the procedure. The right neck and chest were prepped with chlorhexidine in a sterile fashion, and a sterile drape  was applied covering the operative field. Maximum barrier sterile technique with sterile gowns and gloves were used for the procedure. A timeout was performed prior to the initiation of the procedure. Ultrasound was used to examine the jugular vein which was compressible and free of internal echoes. A skin marker was used to demarcate the planned venotomy and port pocket incision sites. Local anesthesia was provided to these sites and the subcutaneous tunnel track with 1% lidocaine with 1:100,000 epinephrine. A small incision was created at the jugular access site and blunt dissection was performed of the subcutaneous tissues. Under ultrasound guidance, the jugular vein was accessed with a 21 ga micropuncture needle and an 0.018" wire was inserted to the superior vena cava. Real-time ultrasound guidance was utilized for vascular access including the acquisition of a permanent ultrasound image documenting patency of the accessed vessel. A 5 Fr micopuncture set was then used, through which a 0.035" Rosen wire was passed under fluoroscopic guidance into the inferior vena cava. An 8 Fr dilator was then placed over the wire. A subcutaneous port pocket was then created along the upper chest wall utilizing a combination of sharp and blunt dissection. The pocket was  irrigated with sterile saline, packed with gauze, and observed for hemorrhage. A single lumen "ISP" sized power injectable port was chosen for placement. The 8 Fr catheter was tunneled from the port pocket site to the venotomy incision. The port was placed in the pocket. The external catheter was trimmed to appropriate length. The dilator was exchanged for an 8 Fr peel-away sheath under fluoroscopic guidance. The catheter was then placed through the sheath and the sheath was removed. Final catheter positioning was confirmed and documented with a fluoroscopic spot radiograph. The port was accessed with a Huber needle, aspirated, and flushed with heparinized saline. The deep dermal layer of the port pocket incision was closed with interrupted 3-0 Vicryl suture. Dermabond was then placed over the port pocket and neck incisions. The patient tolerated the procedure well without immediate post procedural complication. FINDINGS: After catheter placement, the tip lies within the superior cavoatrial junction. The catheter aspirates and flushes normally and is ready for immediate use. IMPRESSION: Successful placement of a power injectable Port-A-Cath via the right internal jugular vein. The catheter is ready for immediate use. Ruthann Cancer, MD [facility] Electronically Signed   By: Ruthann Cancer MD   On: 09/18/2020 10:15      IMPRESSION/PLAN: This is a very pleasant 59 year old male with metastatic disease to the brain, previous history of non-small cell lung cancer.  I had a lengthy discussion with the patient after reviewing their MRI results with him and his family member.  We spoke about whole brain radiotherapy versus stereotactic radiosurgery to the brain. We spoke about the differing risks benefits and side effects of both of these treatments. During part of our discussion, we spoke about the hair loss, fatigue and cognitive effects that can result from  whole brain radiotherapy.  Additionally, we spoke about radionecrosis that can result from stereotactic radiosurgery. I explained that whole brain radiotherapy is more comprehensive and therefore can decrease the chance of recurrences elsewhere in the brain, while stereotactic radiosurgery only treats the areas of gross disease while sparing the rest of the brain parenchyma.  After lengthy discussion, the patient would like to proceed with stereotactic brain radiosurgery to their metastatic disease. They will meet with neurosurgery in the near future to discuss this further; a neurosurgeon will  participate in their case.  CT simulation will take place on today and treatment on next week.  I plan to deliver 20 Gy in 1 fraction to the 3 metastases.  On date of service, in total, I spent 45 minutes on this encounter. Patient was seen in person.   __________________________________________   Eppie Gibson, MD  This document serves as a record of services personally performed by Eppie Gibson, MD. It was created on her behalf by Roney Mans, a trained medical scribe. The creation of this record is based on the scribe's personal observations and the provider's statements to them. This document has been checked and approved by the attending provider.

## 2020-10-06 ENCOUNTER — Ambulatory Visit
Admission: RE | Admit: 2020-10-06 | Discharge: 2020-10-06 | Disposition: A | Discharge status: Home / Self Care | Payer: BC Managed Care – PPO | Source: Ambulatory Visit | Attending: Radiation Oncology | Admitting: Radiation Oncology

## 2020-10-06 ENCOUNTER — Other Ambulatory Visit: Payer: Self-pay | Admitting: *Deleted

## 2020-10-06 VITALS — BP 134/65 | HR 106 | Temp 97.9°F | Resp 18 | Ht 70.0 in | Wt 189.2 lb

## 2020-10-06 DIAGNOSIS — C7951 Secondary malignant neoplasm of bone: Secondary | ICD-10-CM | POA: Insufficient documentation

## 2020-10-06 DIAGNOSIS — M545 Low back pain, unspecified: Secondary | ICD-10-CM | POA: Diagnosis not present

## 2020-10-06 DIAGNOSIS — C7931 Secondary malignant neoplasm of brain: Secondary | ICD-10-CM

## 2020-10-06 DIAGNOSIS — Z95828 Presence of other vascular implants and grafts: Secondary | ICD-10-CM

## 2020-10-06 DIAGNOSIS — I10 Essential (primary) hypertension: Secondary | ICD-10-CM | POA: Diagnosis not present

## 2020-10-06 DIAGNOSIS — C3491 Malignant neoplasm of unspecified part of right bronchus or lung: Secondary | ICD-10-CM

## 2020-10-06 DIAGNOSIS — Z79899 Other long term (current) drug therapy: Secondary | ICD-10-CM | POA: Diagnosis not present

## 2020-10-06 DIAGNOSIS — K219 Gastro-esophageal reflux disease without esophagitis: Secondary | ICD-10-CM | POA: Diagnosis not present

## 2020-10-06 DIAGNOSIS — C3482 Malignant neoplasm of overlapping sites of left bronchus and lung: Secondary | ICD-10-CM | POA: Diagnosis not present

## 2020-10-06 DIAGNOSIS — E785 Hyperlipidemia, unspecified: Secondary | ICD-10-CM | POA: Diagnosis not present

## 2020-10-06 DIAGNOSIS — C3411 Malignant neoplasm of upper lobe, right bronchus or lung: Secondary | ICD-10-CM | POA: Insufficient documentation

## 2020-10-06 DIAGNOSIS — Z923 Personal history of irradiation: Secondary | ICD-10-CM | POA: Insufficient documentation

## 2020-10-06 LAB — CBC WITH DIFFERENTIAL (CANCER CENTER ONLY)
Abs Immature Granulocytes: 0.01 10*3/uL (ref 0.00–0.07)
Basophils Absolute: 0 10*3/uL (ref 0.0–0.1)
Basophils Relative: 0 %
Eosinophils Absolute: 0.1 10*3/uL (ref 0.0–0.5)
Eosinophils Relative: 3 %
HCT: 30.3 % — ABNORMAL LOW (ref 39.0–52.0)
Hemoglobin: 10.3 g/dL — ABNORMAL LOW (ref 13.0–17.0)
Immature Granulocytes: 0 %
Lymphocytes Relative: 11 %
Lymphs Abs: 0.3 10*3/uL — ABNORMAL LOW (ref 0.7–4.0)
MCH: 28.4 pg (ref 26.0–34.0)
MCHC: 34 g/dL (ref 30.0–36.0)
MCV: 83.5 fL (ref 80.0–100.0)
Monocytes Absolute: 0.5 10*3/uL (ref 0.1–1.0)
Monocytes Relative: 16 %
Neutro Abs: 2.2 10*3/uL (ref 1.7–7.7)
Neutrophils Relative %: 70 %
Platelet Count: 120 10*3/uL — ABNORMAL LOW (ref 150–400)
RBC: 3.63 MIL/uL — ABNORMAL LOW (ref 4.22–5.81)
RDW: 14.4 % (ref 11.5–15.5)
WBC Count: 3.1 10*3/uL — ABNORMAL LOW (ref 4.0–10.5)
nRBC: 0 % (ref 0.0–0.2)

## 2020-10-06 LAB — CMP (CANCER CENTER ONLY)
ALT: 15 U/L (ref 0–44)
AST: 19 U/L (ref 15–41)
Albumin: 3.7 g/dL (ref 3.5–5.0)
Alkaline Phosphatase: 87 U/L (ref 38–126)
Anion gap: 9 (ref 5–15)
BUN: 13 mg/dL (ref 6–20)
CO2: 27 mmol/L (ref 22–32)
Calcium: 9.6 mg/dL (ref 8.9–10.3)
Chloride: 103 mmol/L (ref 98–111)
Creatinine: 0.78 mg/dL (ref 0.61–1.24)
GFR, Estimated: >60 mL/min (ref >60)
Glucose, Bld: 99 mg/dL (ref 70–99)
Potassium: 3.7 mmol/L (ref 3.5–5.1)
Sodium: 139 mmol/L (ref 135–145)
Total Bilirubin: 0.5 mg/dL (ref 0.3–1.2)
Total Protein: 7.2 g/dL (ref 6.5–8.1)

## 2020-10-06 MED ORDER — SODIUM CHLORIDE 0.9% FLUSH
10.0000 mL | Freq: Once | INTRAVENOUS | Status: AC
  Administered 2020-10-06: 10 mL via INTRAVENOUS

## 2020-10-06 MED ORDER — HEPARIN SOD (PORK) LOCK FLUSH 100 UNIT/ML IV SOLN
500.0000 [IU] | Freq: Once | INTRAVENOUS | Status: AC
  Administered 2020-10-06: 500 [IU] via INTRAVENOUS

## 2020-10-06 NOTE — Progress Notes (Signed)
Has armband been applied?  Yes.    Does patient have an allergy to IV contrast dye?: No.   Has patient ever received premedication for IV contrast dye?: No.   Does patient take metformin?: No.  Date of lab work: September 30, 2020 BUN: 16 CR: 0.96 eGFR: >60  IV site: subclavian right, condition patent and no redness  Has IV site been added to flowsheet?  Yes.    **CBC and CMP for [facility] team also drawn at time of port access for CT simulation

## 2020-10-07 ENCOUNTER — Other Ambulatory Visit: Payer: Self-pay | Admitting: Internal Medicine

## 2020-10-07 ENCOUNTER — Encounter: Payer: Self-pay | Admitting: Radiation Oncology

## 2020-10-07 ENCOUNTER — Inpatient Hospital Stay: Payer: BC Managed Care – PPO

## 2020-10-07 ENCOUNTER — Other Ambulatory Visit: Payer: BC Managed Care – PPO

## 2020-10-07 DIAGNOSIS — C3491 Malignant neoplasm of unspecified part of right bronchus or lung: Secondary | ICD-10-CM

## 2020-10-07 DIAGNOSIS — C7931 Secondary malignant neoplasm of brain: Secondary | ICD-10-CM | POA: Diagnosis not present

## 2020-10-07 MED ORDER — OXYCODONE-ACETAMINOPHEN 5-325 MG PO TABS: 1.0000 | ORAL_TABLET | Freq: Four times a day (QID) | ORAL | 0 refills | Status: DC | PRN

## 2020-10-09 ENCOUNTER — Telehealth: Payer: Self-pay

## 2020-10-09 NOTE — Progress Notes (Signed)
_0  ID: Richard Li, male    DOB: Feb 21, 1961, 60 y.o.   MRN: [id number]  Chief Complaint  Patient presents with   Follow-up    Adenocarcinoma, currently getting treatments    Referring provider: [name], DO  HPI:   60 year old man with recently diagnosed right-sided lung cancer 12/2019 via bronchoscopy/EBUS who spent essentially all of November in the hospital for recurrent right-sided pneumonia whom are seeing in hospital follow-up after another admission 04/2020.  Extensive notes from multiple hospitalizations reviewed.  Patient return to clinic today.  He states he continues to improve.  Still has productive cough but much much improved and stable. Out of prednisone. Continuing doxycycline. Unfortunately recent PET reviewed with new mets in bones, etc. MRI brain with suspicion for met. Getting radiation to bone mets. On chemotherapy. Tolerating ok. CT chest reviewed with mild improvement compared to prior but persistent atelectasis/infiltrate on right.   HPI at initial visit: Started with productive cough it seems early November.  This is prompted 4 separate hospitalizations and multiple antibiotic courses.  He had recently completed first cycle of carboplatin/paclitaxel.  Was in the midst of radiation therapy to the right upper lobe mass which he completed around Thanksgiving 2021.  He has had cough productive of green sputum.  It is foul tasting.  Smells bad.  Despite multiple rounds of antibiotic is not improved.  His shortness of breath is worsening.  The most recent hospitalization 03/2020 a bronchoscopy was pursued. Mucopurulent secretions noted emanating from RUL. Secretions likely aspirated seen in BI, RLL. Culture with OP flora. No cell counts sent.  Continually coughing, producing sputum as above. Worried because not getting chemotherapy for cancer. Recently completed coures 14 day prednisone for possible radiation pneumonitis. This did not help symptoms. Finishing  course of augmentin. Not seen improvement.  Serial CXRs reviewed which show persistent and worsening R mid and upper lung field opacities. CT 02/2020 reviewed with necrotic appearing mass near RUL takeoff centrally, dense consolidations posterior RUL and throughout R with scattered GGOs in left lung.   Questionaires / Pulmonary Flowsheets:   ACT:  No flowsheet data found.  MMRC: mMRC Dyspnea Scale mMRC Score  06/05/2020 2    Epworth:  No flowsheet data found.  Tests:   FENO:  No results found for: NITRICOXIDE  PFT: No flowsheet data found.  WALK:  No flowsheet data found.  Imaging: Personally reviewed and as per EMR and discussion this note  MR Brain W Wo Contrast  Result Date: 10/03/2020 CLINICAL DATA:  Lung cancer. Solitary brain metastasis. Planning for SRS. EXAM: MRI HEAD WITHOUT AND WITH CONTRAST TECHNIQUE: Multiplanar, multiecho pulse sequences of the brain and surrounding structures were obtained without and with intravenous contrast. CONTRAST:  67m MULTIHANCE GADOBENATE DIMEGLUMINE 529 MG/ML IV SOLN COMPARISON:  MR head without and with contrast 09/26/2020 FINDINGS: Brain: The rim enhancing inferior right cerebellar metastasis is stable, measuring 9 x 7 x 6 mm. There is surrounding vasogenic edema without significant mass effect. A second punctate focus of enhancement in the right cerebellum on image 46 of series 12 likely represents a second metastasis. Faint enhancement is present in the left frontal lobe on image 130 of series 11. This corresponds to the area previously noted restricted diffusion. The effusion is no longer restricted. This likely represents a remote lacunar infarct rather than a metastasis. A cortical focus of enhancement is present on image 136 of series 11 and 125 of series 12 consistent with a 3 mm lesion in the  posterior left frontal lobe. No acute infarct or hemorrhage is present. No other significant white matter disease is present. The ventricles are  of normal size. No significant extraaxial fluid collection is present. The internal auditory canals are within normal limits. The brainstem and cerebellum are otherwise within normal limits. Vascular: Flow is present in the major intracranial arteries. Skull and upper cervical spine: The craniocervical junction is normal. Upper cervical spine is within normal limits. Marrow signal is unremarkable. Sinuses/Orbits: A polyp or mucous retention cyst is present in the inferior left maxillary sinus. The paranasal sinuses and mastoid air cells are otherwise clear. The globes and orbits are within normal limits. IMPRESSION: 1. Stable 9 x 7 x 6 mm rim enhancing metastasis in the inferior right cerebellar hemisphere with surrounding vasogenic edema but no significant mass effect. 2. Two additional foci of enhancement consistent with additional mets. 3. Additional punctate focus of enhancement in the right cerebellum likely represents a second metastasis. 4. 3 mm cortical focus of enhancement in the posterior left frontal lobe consistent with a metastasis. 5. 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 Electronically Signed   By: San Morelle M.D.   On: 10/03/2020 12:18   MR Brain W Wo Contrast  Result Date: 09/27/2020 CLINICAL DATA:  60 year old male with metastatic non-small cell lung cancer. Restaging. EXAM: MRI HEAD WITHOUT AND WITH CONTRAST TECHNIQUE: Multiplanar, multiecho pulse sequences of the brain and surrounding structures were obtained without and with intravenous contrast. CONTRAST:  25m GADAVIST GADOBUTROL 1 MMOL/ML IV SOLN COMPARISON:  Brain MRI 01/25/2020. FINDINGS: Brain: Solitary new rim enhancing lesion in the central right cerebellum measures 9 mm diameter (series 16, image 33) with mild regional T2 and FLAIR hyperintense edema. No significant posterior fossa mass effect. The lesion is mildly positive on DWI. Elsewhere punctate restricted diffusion in the left centrum semiovale  (series 5, image 91) is without associated enhancement, but rather has punctate T2/FLAIR hyperintensity and decreased postcontrast T1 signal. This most resembles a punctate acute lacunar infarct. No other abnormal diffusion or enhancement identified. No dural thickening. Minimal additional nonspecific cerebral white matter T2 and FLAIR hyperintensity with no cortical encephalomalacia. No chronic cerebral blood products identified. No midline shift, ventriculomegaly, extra-axial collection or acute intracranial hemorrhage. Cervicomedullary junction and pituitary are within normal limits. Vascular: Major intracranial vascular flow voids are stable since last year. Dominant left vertebral artery, left transverse and sigmoid sinuses. The major dural venous sinuses demonstrates stable enhancement on postcontrast images. Skull and upper cervical spine: Negative visible cervical spine and spinal cord. Visualized bone marrow signal is within normal limits. Sinuses/Orbits: Stable, negative. Other: Stable mild mastoid effusions. Grossly normal visible internal auditory structures. Negative visible scalp and face. IMPRESSION: 1. Positive for a solitary new 9 mm rim enhancing Metastasis in the central right cerebellum. Mild edema but no mass effect. 2. A superimposed punctate focus of nonenhancing abnormal diffusion in the left centrum semiovale is most compatible with Recent white matter Lacunar Infarct. Attention directed on follow-up to ensure expected resolution. 3. No other metastatic disease or acute intracranial abnormality identified. Electronically Signed   By: HGenevie AnnM.D.   On: 09/27/2020 08:26   NM PET Image Restag (PS) Skull Base To Thigh  Result Date: 09/11/2020 CLINICAL DATA:  Subsequent treatment strategy for non-small-cell lung cancer diagnosed last September. Radiation therapy and chemotherapy complete. EXAM: NUCLEAR MEDICINE PET SKULL BASE TO THIGH TECHNIQUE: mCi F-18 FDG was injected intravenously.  Full-ring PET imaging was performed from the skull base  to thigh after the radiotracer. CT data was obtained and used for attenuation correction and anatomic localization. Fasting blood glucose:  mg/dl COMPARISON:  Chest CT of 07/20/2020. Abdominal MRI of 06/27/2020. Most recent PET of 01/16/2020. FINDINGS: Mediastinal blood pool activity: SUV max 2.3 Liver activity: SUV max NA NECK: No cervical nodal hypermetabolism. Lower cervical left-sided scalene muscular hypermetabolism is likely due to motion after radiopharmaceutical. Incidental CT findings: No cervical adenopathy. CHEST: Circumferential right apical pleural thickening with low-level hypermetabolism is is suspicious but may be partially treatment related. However, new areas of extensive right-sided pleural irregularity and hypermetabolism more inferiorly are consistent with metastatic disease. Example at 7 mm and a S.U.V. max of 7.2 on 103/4. Hypermetabolism along the right cardiac border is favored to be due to medial pleural disease including at a S.U.V. max of 11.2 on image 100/4. Right paratracheal node measures 6 mm and a S.U.V. max of 5.0 on 75/4. Probable left-sided pleural disease is evidenced by hypermetabolism which is favored to be pleural rather than arising from the adjacent rib. Example at a S.U.V. max of 5.3 on 85/4. Incidental CT findings: Aortic atherosclerosis. Moderate centrilobular emphysema. Consolidation and cavitation in the right apex at the site of treated tumor. Index left lower lobe pulmonary nodule of 7 mm on 43/8 measured 6 mm on 07/20/2020. ABDOMEN/PELVIS: New metastasis within the high left hepatic lobe at 1.4 cm and a S.U.V. max of 12.0 on 118/4 . The previously detailed inter/upper pole right renal mass is hypermetabolic, on the order of 3.3 cm and a S.U.V. max of 13.0 on 139/4. Retrocaval node measures 7 mm and a S.U.V. max of 8.8 on 141/4. Incidental CT findings: Abdominal aortic atherosclerosis. Mild prostatomegaly.  SKELETON: Widespread osseous metastasis. Index T12 lesion measures a S.U.V. max of 16.7. A right acetabular lesion is relatively CT occult at a S.U.V. max of 9.7 on 195/4. T11 spinous process lesion measures a S.U.V. max of 8.9. Metastasis involving the left C1 arch is relatively CT occult at a S.U.V. max of 7.7 on 28/4. Incidental CT findings: none IMPRESSION: 1. Widespread hypermetabolic metastatic disease, including to the right pleural space, liver, thoracoabdominal nodes, bones. Probable left-sided pleural metastasis as well. 2. Pulmonary nodules which are below PET resolution. Suspect mild enlargement of a left lower lobe nodule, also suspicious for metastatic disease. 3. Aortic atherosclerosis (ICD10-I70.0) and emphysema (ICD10-J43.9). Electronically Signed   By: Abigail Miyamoto M.D.   On: 09/11/2020 09:15   IR IMAGING GUIDED PORT INSERTION  Result Date: 09/18/2020 INDICATION: 60 year old male with history of non-small cell lung cancer requiring central venous access for chemotherapy. EXAM: IMPLANTED PORT A CATH PLACEMENT WITH ULTRASOUND AND FLUOROSCOPIC GUIDANCE COMPARISON:  None. MEDICATIONS: None. ANESTHESIA/SEDATION: Moderate (conscious) sedation was employed during this procedure. A total of Versed 2 mg and Fentanyl 100 mcg was administered intravenously. Moderate Sedation Time: 16 minutes. The patient's level of consciousness and vital signs were monitored continuously by radiology nursing throughout the procedure under my direct supervision. CONTRAST:  None FLUOROSCOPY TIME:  0 minutes, 24 seconds (2 mGy) COMPLICATIONS: None immediate. PROCEDURE: The procedure, risks, benefits, and alternatives were explained to the patient. Questions regarding the procedure were encouraged and answered. The patient understands and consents to the procedure. The right neck and chest were prepped with chlorhexidine in a sterile fashion, and a sterile drape was applied covering the operative field. Maximum barrier  sterile technique with sterile gowns and gloves were used for the procedure. A timeout was performed prior to the initiation  of the procedure. Ultrasound was used to examine the jugular vein which was compressible and free of internal echoes. A skin marker was used to demarcate the planned venotomy and port pocket incision sites. Local anesthesia was provided to these sites and the subcutaneous tunnel track with 1% lidocaine with 1:100,000 epinephrine. A small incision was created at the jugular access site and blunt dissection was performed of the subcutaneous tissues. Under ultrasound guidance, the jugular vein was accessed with a 21 ga micropuncture needle and an 0.018" wire was inserted to the superior vena cava. Real-time ultrasound guidance was utilized for vascular access including the acquisition of a permanent ultrasound image documenting patency of the accessed vessel. A 5 Fr micopuncture set was then used, through which a 0.035" Rosen wire was passed under fluoroscopic guidance into the inferior vena cava. An 8 Fr dilator was then placed over the wire. A subcutaneous port pocket was then created along the upper chest wall utilizing a combination of sharp and blunt dissection. The pocket was irrigated with sterile saline, packed with gauze, and observed for hemorrhage. A single lumen "ISP" sized power injectable port was chosen for placement. The 8 Fr catheter was tunneled from the port pocket site to the venotomy incision. The port was placed in the pocket. The external catheter was trimmed to appropriate length. The dilator was exchanged for an 8 Fr peel-away sheath under fluoroscopic guidance. The catheter was then placed through the sheath and the sheath was removed. Final catheter positioning was confirmed and documented with a fluoroscopic spot radiograph. The port was accessed with a Huber needle, aspirated, and flushed with heparinized saline. The deep dermal layer of the port pocket incision was  closed with interrupted 3-0 Vicryl suture. Dermabond was then placed over the port pocket and neck incisions. The patient tolerated the procedure well without immediate post procedural complication. FINDINGS: After catheter placement, the tip lies within the superior cavoatrial junction. The catheter aspirates and flushes normally and is ready for immediate use. IMPRESSION: Successful placement of a power injectable Port-A-Cath via the right internal jugular vein. The catheter is ready for immediate use. Ruthann Cancer, MD [facility] Electronically Signed   By: Ruthann Cancer MD   On: 09/18/2020 10:15     Lab Results: Personally reviewed CBC    Component Value Date/Time   WBC 3.1 (L) 10/06/2020 0950   WBC 7.9 08/20/2020 0941   RBC 3.63 (L) 10/06/2020 0950   HGB 10.3 (L) 10/06/2020 0950   HCT 30.3 (L) 10/06/2020 0950   PLT 120 (L) 10/06/2020 0950   MCV 83.5 10/06/2020 0950   MCH 28.4 10/06/2020 0950   MCHC 34.0 10/06/2020 0950   RDW 14.4 10/06/2020 0950   LYMPHSABS 0.3 (L) 10/06/2020 0950   MONOABS 0.5 10/06/2020 0950   EOSABS 0.1 10/06/2020 0950   BASOSABS 0.0 10/06/2020 0950    BMET    Component Value Date/Time   NA 139 10/06/2020 0950   K 3.7 10/06/2020 0950   CL 103 10/06/2020 0950   CO2 27 10/06/2020 0950   GLUCOSE 99 10/06/2020 0950   BUN 13 10/06/2020 0950   CREATININE 0.78 10/06/2020 0950   CREATININE 0.67 (L) 06/24/2020 1517   CALCIUM 9.6 10/06/2020 0950   GFRNONAA >60 10/06/2020 0950   GFRNONAA 105 06/24/2020 1517   GFRAA 122 06/24/2020 1517    BNP No results found for: BNP  ProBNP No results found for: PROBNP  Specialty Problems  Pulmonary Problems   Allergic rhinitis    Qualifier: Diagnosis of  By: Julien Girt CMA, Leigh         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HCC)    Qualifier: Diagnosis of  By: Julien Girt CMA, Leigh         Cavitating mass in right  middle lung lobe   Pulmonary nodules/lesions, multiple   Adenocarcinoma of right lung, stage 2 (HCC)   Sinus congestion   SOB (shortness of breath)   Recurrent pneumonia   Adenocarcinoma of right lung, stage 4 (HCC)    Allergies  Allergen Reactions   Amoxicillin Rash    Immunization History  Administered Date(s) Administered   Influenza Split 01/25/2012   Influenza Whole 02/13/2009, 01/25/2010   Influenza,inj,Quad PF,6+ Mos 03/30/2015, 02/16/2016, 02/27/2017, 01/24/2018, 03/01/2019, 02/10/2020   Influenza,inj,quad, With Preservative 01/23/2017   Tdap 12/12/2011    Past Medical History:  Diagnosis Date   Allergic rhinitis, cause unspecified    Anxiety state, unspecified    Asthma    as a child   Chronic airway obstruction, not elsewhere classified    Esophageal reflux    History of kidney stones    History of radiation therapy 02/13/2020-03/24/2020   right lung        Dr Gery Pray   Hypertension    Lumbago    lung ca dx'd 12/2019   Other and unspecified hyperlipidemia    Other chest pain     Tobacco History: Social History   Tobacco Use  Smoking Status Former   Packs/day: 2.00   Years: 28.00   Pack years: 56.00   Types: Cigarettes   Quit date: 04/25/2005   Years since quitting: 15.4  Smokeless Tobacco Never   Counseling given: Not Answered   Continue to not smoke  Outpatient Encounter Medications as of 09/30/2020  Medication Sig   acetaminophen (TYLENOL) 500 MG tablet Take 1,000 mg by mouth 2 (two) times daily.   Brimonidine Tartrate (LUMIFY) 0.025 % SOLN Place 1 drop into both eyes 2 (two) times a week. As needed   dextromethorphan-guaiFENesin (MUCINEX DM) 30-600 MG 12hr tablet Take 1 tablet by mouth 2 (two) times daily.   doxycycline (VIBRA-TABS) 100 MG tablet Take 1 tablet (100 mg total) by mouth 2 (two) times daily.   feeding supplement (ENSURE ENLIVE / ENSURE PLUS) LIQD Take 237 mLs by mouth 3 (three) times daily between meals. (Patient taking  differently: Take 237 mLs by mouth 2 (two) times daily between meals.)   fenofibrate 160 MG tablet Take 1 tablet by mouth once daily (Patient taking differently: Take 160 mg by mouth daily.)   ferrous sulfate 325 (65 FE) MG tablet Take 1 tablet (325 mg total) by mouth daily with breakfast.   folic acid (FOLVITE) 1 MG tablet Take 1 tablet (1 mg total) by mouth daily.   Ipratropium-Albuterol (COMBIVENT RESPIMAT) 20-100 MCG/ACT AERS respimat Inhale 1-2 puffs into the lungs every 6 (six) hours as needed for wheezing.   lidocaine-prilocaine (EMLA) cream Apply 1 application topically as needed.   LORazepam (ATIVAN) 1 MG tablet Take 1 tablet (1 mg total) by mouth See admin instructions. Take one tablet (1 mg) by mouth daily at bedtime, may also take one tablet (1 mg) twice during the day as needed for anxiety (Patient taking differently: Take 1 mg by mouth 3 (three) times daily. Take one tablet (1 mg) by mouth daily at bedtime, may also take one tablet (1 mg) twice during the day as needed  for anxiety)   meclizine (ANTIVERT) 25 MG tablet Take 1 tablet (25 mg total) by mouth every 6 (six) hours as needed for dizziness.   Multiple Vitamin (MULTIVITAMIN WITH MINERALS) TABS tablet Take 1 tablet by mouth daily.   ondansetron (ZOFRAN) 8 MG tablet Take 1 tablet (8 mg total) by mouth every 8 (eight) hours as needed for nausea or vomiting. Starting day 3 after chemotherapy   prochlorperazine (COMPAZINE) 10 MG tablet Take 1 tablet (10 mg total) by mouth every 6 (six) hours as needed.   [DISCONTINUED] oxyCODONE-acetaminophen (PERCOCET/ROXICET) 5-325 MG tablet Take 1 tablet by mouth every 6 (six) hours as needed for severe pain.   [DISCONTINUED] predniSONE (DELTASONE) 10 MG tablet Take 1 tablet (10 mg total) by mouth daily with breakfast.   predniSONE (DELTASONE) 10 MG tablet Take 1 tablet (10 mg total) by mouth daily with breakfast.   [DISCONTINUED] ondansetron (ZOFRAN) 8 MG tablet Take 1 tablet (8 mg total) by mouth  every 8 (eight) hours as needed for nausea or vomiting.   [DISCONTINUED] potassium chloride SA (KLOR-CON) 20 MEQ tablet Take 2 tablets (40 mEq total) by mouth daily.   [DISCONTINUED] sucralfate (CARAFATE) 1 g tablet Take 1 tablet (1 g total) by mouth 4 (four) times daily -  with meals and at bedtime. (Patient not taking: Reported on 03/05/2020)   Facility-Administered Encounter Medications as of 09/30/2020  Medication   0.9 %  sodium chloride infusion     Review of Systems n/a  Physical Exam  BP 100/60   Pulse 77   Ht _0  (1.778 m)   Wt 190 lb (86.2 kg)   SpO2 100%   BMI 27.26 kg/m   Wt Readings from Last 5 Encounters:  10/06/20 189 lb 3.2 oz (85.8 kg)  09/30/20 190 lb (86.2 kg)  09/30/20 190 lb 12.8 oz (86.5 kg)  09/18/20 180 lb (81.6 kg)  09/16/20 191 lb (86.6 kg)    BMI Readings from Last 5 Encounters:  10/06/20 27.15 kg/m  09/30/20 27.26 kg/m  09/30/20 27.38 kg/m  09/18/20 25.83 kg/m  09/16/20 27.41 kg/m     Physical Exam General: Sitting up in exam chair, in NAD Eyes: EOMI, no icterus Respiratory: Coarse bronchial breath sounds on inspiration throughout the right particularly mid to upper lung fields, left clear, both improved from prior Cardiovascular: Borderline tachycardic, no murmurs Extremities: No edema, warm   Assessment & Plan:   Productive cough, CT/CXR infiltrate: CT/PET 09/10/20 with improved RML and RLL infiltrates with persistent mixed consolidation and atelectasis RUL. Still with productive cough. Continue doxycycline and prednisone.   Dyspnea exertion, weakness: In the setting of lung cancer with severe deconditioning due to therapy as well as severe deconditioning due to prolonged pulmonary infection over the last several months requires ongoing treatment.  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 His weakness would be a risk to himself as well as other driver/computers on the road where he to return  to work at this time.   Return in about 3 months (around 12/31/2020).   Lanier Clam, MD 10/09/2020

## 2020-10-09 NOTE — Telephone Encounter (Signed)
Disability/FMLA forms received via fax and signed in to the Disability/FMLA/PA office for processing.  I have called pts wife and advised of this. She expressed understanding of this information.

## 2020-10-13 ENCOUNTER — Ambulatory Visit
Admission: RE | Admit: 2020-10-13 | Discharge: 2020-10-13 | Disposition: A | Discharge status: Home / Self Care | Payer: BC Managed Care – PPO | Source: Ambulatory Visit | Attending: Radiation Oncology | Admitting: Radiation Oncology

## 2020-10-13 ENCOUNTER — Encounter: Payer: Self-pay | Admitting: Internal Medicine

## 2020-10-13 ENCOUNTER — Other Ambulatory Visit: Payer: Self-pay

## 2020-10-13 ENCOUNTER — Encounter: Payer: Self-pay | Admitting: Radiation Oncology

## 2020-10-13 VITALS — BP 125/73 | HR 104 | Temp 97.8°F | Resp 20

## 2020-10-13 DIAGNOSIS — C7931 Secondary malignant neoplasm of brain: Secondary | ICD-10-CM

## 2020-10-13 NOTE — Progress Notes (Signed)
Nurse monitoring complete status post 1 of 1 SRS treatments. Patient without complaints. Patient denies new or worsening neurologic symptoms. Vitals stable. Instructed patient to avoid strenuous activity for the next 24 hours. Instructed patient to call [phone] with needs related to treatment after hours or over the weekend. Patient verbalized understanding. Ambulated out of clinic unassisted to wife's vehicle without incident  Vitals:   10/13/20 1524  BP: 125/73  Pulse: (!) 104  Resp: 20  Temp: 97.8 F (36.6 C)  SpO2: 100%

## 2020-10-13 NOTE — Op Note (Signed)
Name: ALEXYS GASSETT    MRN: [id number]   Date: 10/13/2020    DOB: [birth year]   STEREOTACTIC RADIOSURGERY OPERATIVE NOTE  PRE-OPERATIVE DIAGNOSIS:  Metastatic Non Small Cell Carcinoma  POST-OPERATIVE DIAGNOSIS:  Same  PROCEDURE:  Stereotactic Radiosurgery, three lesions  SURGEON:  Elwin Sleight, DO  RADIATION ONCOLOGIST: Lowella Dandy, MD  TECHNIQUE:  The patient underwent a radiation treatment planning session in the radiation oncology simulation suite under the care of the radiation oncology physician and physicist.  I participated closely in the radiation treatment planning afterwards. The patient underwent planning CT which was fused to 3T high resolution MRI with 1 mm axial slices.  These images were fused on the planning system.  We contoured the gross targets volumes and subsequently expanded this to yield the Planning Target Volume. I actively participated in the planning process.  I helped to define and review the targets contours and also the contours of the optic pathway, eyes, brainstem and selected nearby organs at risk.  All the dose constraints for critical structures were reviewed and compared to AAPM Task Group 101.  The prescription dose conformity was reviewed.  I approved the plan electronically.    Accordingly, [name]  was brought to the TrueBeam stereotactic radiation treatment linac and placed in the custom immobilization mask.  The patient was aligned according to the IR fiducial markers with BrainLab Exactrac, then orthogonal x-rays were used in ExacTrac with the 6DOF robotic table and the shifts were made to align the patient.  [name] received stereotactic radiosurgery to a prescription dose of 20Gy in a single fraction to three targets uneventfully.    The detailed description of the procedure is recorded in the radiation oncology procedure note.  I was present for the duration of the procedure.  DISPOSITION:   Following delivery, the patient was  transported to nursing in stable condition and monitored for possible acute effects to be discharged to home in stable condition with follow-up in one month.  Elwin Sleight, [facility]

## 2020-10-15 ENCOUNTER — Inpatient Hospital Stay: Payer: BC Managed Care – PPO

## 2020-10-15 ENCOUNTER — Other Ambulatory Visit: Payer: BC Managed Care – PPO

## 2020-10-15 ENCOUNTER — Inpatient Hospital Stay (HOSPITAL_BASED_OUTPATIENT_CLINIC_OR_DEPARTMENT_OTHER): Payer: BC Managed Care – PPO | Admitting: Internal Medicine

## 2020-10-15 ENCOUNTER — Other Ambulatory Visit: Payer: Self-pay

## 2020-10-15 ENCOUNTER — Inpatient Hospital Stay: Payer: BC Managed Care – PPO | Admitting: Nutrition

## 2020-10-15 VITALS — BP 124/75 | HR 108 | Temp 97.8°F | Resp 18 | Ht 70.0 in | Wt 190.1 lb

## 2020-10-15 VITALS — HR 88

## 2020-10-15 DIAGNOSIS — C3491 Malignant neoplasm of unspecified part of right bronchus or lung: Secondary | ICD-10-CM

## 2020-10-15 DIAGNOSIS — C7931 Secondary malignant neoplasm of brain: Secondary | ICD-10-CM

## 2020-10-15 DIAGNOSIS — I10 Essential (primary) hypertension: Secondary | ICD-10-CM

## 2020-10-15 DIAGNOSIS — Z5112 Encounter for antineoplastic immunotherapy: Secondary | ICD-10-CM

## 2020-10-15 DIAGNOSIS — Z5111 Encounter for antineoplastic chemotherapy: Secondary | ICD-10-CM

## 2020-10-15 DIAGNOSIS — C3482 Malignant neoplasm of overlapping sites of left bronchus and lung: Secondary | ICD-10-CM | POA: Diagnosis not present

## 2020-10-15 LAB — CBC WITH DIFFERENTIAL (CANCER CENTER ONLY)
Abs Immature Granulocytes: 0.02 10*3/uL (ref 0.00–0.07)
Basophils Absolute: 0 10*3/uL (ref 0.0–0.1)
Basophils Relative: 1 %
Eosinophils Absolute: 0.1 10*3/uL (ref 0.0–0.5)
Eosinophils Relative: 2 %
HCT: 32.9 % — ABNORMAL LOW (ref 39.0–52.0)
Hemoglobin: 11 g/dL — ABNORMAL LOW (ref 13.0–17.0)
Immature Granulocytes: 1 %
Lymphocytes Relative: 10 %
Lymphs Abs: 0.4 10*3/uL — ABNORMAL LOW (ref 0.7–4.0)
MCH: 28.8 pg (ref 26.0–34.0)
MCHC: 33.4 g/dL (ref 30.0–36.0)
MCV: 86.1 fL (ref 80.0–100.0)
Monocytes Absolute: 0.8 10*3/uL (ref 0.1–1.0)
Monocytes Relative: 19 %
Neutro Abs: 2.8 10*3/uL (ref 1.7–7.7)
Neutrophils Relative %: 67 %
Platelet Count: 175 10*3/uL (ref 150–400)
RBC: 3.82 MIL/uL — ABNORMAL LOW (ref 4.22–5.81)
RDW: 16 % — ABNORMAL HIGH (ref 11.5–15.5)
WBC Count: 4.1 10*3/uL (ref 4.0–10.5)
nRBC: 0 % (ref 0.0–0.2)

## 2020-10-15 LAB — CMP (CANCER CENTER ONLY)
ALT: 19 U/L (ref 0–44)
AST: 25 U/L (ref 15–41)
Albumin: 3.8 g/dL (ref 3.5–5.0)
Alkaline Phosphatase: 79 U/L (ref 38–126)
Anion gap: 8 (ref 5–15)
BUN: 15 mg/dL (ref 6–20)
CO2: 26 mmol/L (ref 22–32)
Calcium: 9.7 mg/dL (ref 8.9–10.3)
Chloride: 105 mmol/L (ref 98–111)
Creatinine: 0.98 mg/dL (ref 0.61–1.24)
GFR, Estimated: >60 mL/min (ref >60)
Glucose, Bld: 91 mg/dL (ref 70–99)
Potassium: 4 mmol/L (ref 3.5–5.1)
Sodium: 139 mmol/L (ref 135–145)
Total Bilirubin: 0.6 mg/dL (ref 0.3–1.2)
Total Protein: 7.2 g/dL (ref 6.5–8.1)

## 2020-10-15 LAB — TSH: TSH: 2.405 u[IU]/mL (ref 0.320–4.118)

## 2020-10-15 MED ORDER — SODIUM CHLORIDE 0.9 % IV SOLN
Freq: Once | INTRAVENOUS | Status: AC
  Filled 2020-10-15: qty 250

## 2020-10-15 MED ORDER — SODIUM CHLORIDE 0.9% FLUSH
10.0000 mL | INTRAVENOUS | Status: AC | PRN
Start: 2020-10-15 — End: 2020-10-15
  Administered 2020-10-15: 10 mL
  Filled 2020-10-15: qty 10

## 2020-10-15 MED ORDER — SODIUM CHLORIDE 0.9 % IV SOLN
10.0000 mg | Freq: Once | INTRAVENOUS | Status: AC
  Administered 2020-10-15: 10 mg via INTRAVENOUS
  Filled 2020-10-15: qty 10

## 2020-10-15 MED ORDER — SODIUM CHLORIDE 0.9 % IV SOLN
200.0000 mg | Freq: Once | INTRAVENOUS | Status: AC
  Administered 2020-10-15: 200 mg via INTRAVENOUS
  Filled 2020-10-15: qty 8

## 2020-10-15 MED ORDER — SODIUM CHLORIDE 0.9 % IV SOLN
500.0000 mg/m2 | Freq: Once | INTRAVENOUS | Status: AC
  Administered 2020-10-15: 1000 mg via INTRAVENOUS
  Filled 2020-10-15: qty 40

## 2020-10-15 MED ORDER — FOSAPREPITANT DIMEGLUMINE INJECTION 150 MG
150.0000 mg | Freq: Once | INTRAVENOUS | Status: AC
  Administered 2020-10-15: 150 mg via INTRAVENOUS
  Filled 2020-10-15: qty 150

## 2020-10-15 MED ORDER — SODIUM CHLORIDE 0.9 % IV SOLN
611.5000 mg | Freq: Once | INTRAVENOUS | Status: AC
  Administered 2020-10-15: 610 mg via INTRAVENOUS
  Filled 2020-10-15: qty 61

## 2020-10-15 MED ORDER — SODIUM CHLORIDE 0.9 % IV SOLN: 721.5000 mg | Freq: Once | INTRAVENOUS | Status: DC

## 2020-10-15 MED ORDER — PALONOSETRON HCL INJECTION 0.25 MG/5ML
0.2500 mg | Freq: Once | INTRAVENOUS | Status: AC
  Administered 2020-10-15: 0.25 mg via INTRAVENOUS

## 2020-10-15 NOTE — Progress Notes (Signed)
60 year old male diagnosed with metastatic non-small cell lung cancer/brain metastases receiving concurrent chemoradiation therapy.  Past medical history includes anxiety, asthma, esophageal reflux, hypertension, hyperlipidemia.  Medications include ferrous sulfate, Folvite, Ativan, multivitamin, Zofran, prednisone, Compazine.  Labs include glucose 160.  Height: 5 feet 10 inches. Weight: 190.1 pounds on June 23. Usual body weight: 230 pounds last October per patient. Patient weighed 158 pounds May 08, 2020. BMI: 27 0.3  Patient reports he is currently eating better.  He has been regaining weight that he lost.  He is still 83% of his usual body weight.  Reports occasional nausea which is improved with antiemetics.  He is taking a stool softener for occasional constipation.  He is drinking 3 bottles of Ensure Plus a day but prefers equate versus Ensure.  Presently he has no nutrition concerns.  Nutrition diagnosis: Unintended weight loss related to metastatic cancer as evidenced by 31% weight loss in less than 1 year which is significant.  Malnutrition is improving.  Intervention: Continue small frequent meals and snacks with high-calorie, high-protein foods. Encouraged patient to continue to oral nutrition supplements, 3 cartons daily, Ensure Plus or equivalent.  Provided coupons. Reviewed strategies and stressed importance of bowel regimen. Encouraged increased fluid intake. Questions answered.  Teach back method used.  Contact information provided.  Monitoring, evaluation, goals: Patient will tolerate adequate calories and protein to minimize weight loss.  Next visit: To be scheduled as needed.  **Disclaimer: This note was dictated with voice recognition software. Similar sounding words can inadvertently be transcribed and this note may contain transcription errors which may not have been corrected upon publication of note.**

## 2020-10-15 NOTE — Patient Instructions (Signed)
[facility]  Discharge Instructions: Thank you for choosing [facility] to provide your oncology and hematology care.   If you have a lab appointment with the [facility], please go directly to the [facility] and check in at the registration area.   Wear comfortable clothing and clothing appropriate for easy access to any Portacath or PICC line.   We strive to give you quality time with your provider. You may need to reschedule your appointment if you arrive late (15 or more minutes).  Arriving late affects you and other patients whose appointments are after yours.  Also, if you miss three or more appointments without notifying the office, you may be dismissed from the clinic at the provider's discretion.      For prescription refill requests, have your pharmacy contact our office and allow 72 hours for refills to be completed.    Today you received the following chemotherapy and/or immunotherapy agents; Keytruda, Alimta, carboplatin     To help prevent nausea and vomiting after your treatment, we encourage you to take your nausea medication as directed.  BELOW ARE SYMPTOMS THAT SHOULD BE REPORTED IMMEDIATELY: *FEVER GREATER THAN 100.4 F (38 C) OR HIGHER *CHILLS OR SWEATING *NAUSEA AND VOMITING THAT IS NOT CONTROLLED WITH YOUR NAUSEA MEDICATION *UNUSUAL SHORTNESS OF BREATH *UNUSUAL BRUISING OR BLEEDING *URINARY PROBLEMS (pain or burning when urinating, or frequent urination) *BOWEL PROBLEMS (unusual diarrhea, constipation, pain near the anus) TENDERNESS IN MOUTH AND THROAT WITH OR WITHOUT PRESENCE OF ULCERS (sore throat, sores in mouth, or a toothache) UNUSUAL RASH, SWELLING OR PAIN  UNUSUAL VAGINAL DISCHARGE OR ITCHING   Items with * indicate a potential emergency and should be followed up as soon as possible or go to the Emergency Department if any problems should occur.  Please show the CHEMOTHERAPY ALERT CARD or IMMUNOTHERAPY ALERT  CARD at check-in to the Emergency Department and triage nurse.  Should you have questions after your visit or need to cancel or reschedule your appointment, please contact [facility]  Dept: [phone]  and follow the prompts.  Office hours are 8:00 a.m. to 4:30 p.m. Monday - Friday. Please note that voicemails left after 4:00 p.m. may not be returned until the following business day.  We are closed weekends and major holidays. You have access to a nurse at all times for urgent questions. Please call the main number to the clinic Dept: [phone] and follow the prompts.   For any non-urgent questions, you may also contact your provider using MyChart. We now offer e-Visits for anyone 80 and older to request care online for non-urgent symptoms. For details visit [email or web address].   Also download the MyChart app! Go to the app store, search "MyChart", open the app, select [facility], and log in with your MyChart username and password.  Due to Covid, a mask is required upon entering the hospital/clinic. If you do not have a mask, one will be given to you upon arrival. For doctor visits, patients may have 1 support person aged 38 or older with them. For treatment visits, patients cannot have anyone with them due to current Covid guidelines and our immunocompromised population.

## 2020-10-15 NOTE — Progress Notes (Signed)
Richard Li [phone]   [phone]  OFFICE PROGRESS NOTE  [name], DO [address]  [address] [place] [address]  DIAGNOSIS: Metastatic non-small cell lung Li initially diagnosed as stage IIB (T3, N0, M0) non-small cell lung Li, adenocarcinoma presented with large central perihilar mass with suspicious groundglass opacity in the right middle lobe and left upper lobe diagnosed in September 2021.  The patient had evidence of metastatic disease to the right kidney in March 2022.  Molecular studies by guardant 360: No actionable mutations.   PDl1: 80%   PRIOR THERAPY: Concurrent chemoradiation with weekly carboplatin for AUC of 2 and paclitaxel 45 mg/M2.  First dose February 10, 2020.  Status post 5  cycles.   CURRENT THERAPY:  1) Palliative systemic chemotherapy with carboplatin AUC of 5, Alimta 500 mg per metered squared, Keytruda 200 mg IV every 3 weeks.  First dose expected on 09/24/2020. Status post 1 cycle.  2) Palliative radiation to the left anterior rib cage area as well as the mid back area under the care of Dr. Sondra Come. Last treatment on 10/05/20.  INTERVAL HISTORY: Richard Li 60 y.o. male returns to the clinic today for follow-up visit.  The patient is feeling fine today with no concerning complaints except for mild pain on the left rib cage.  He has improvement of the pain on the right side with the oxycodone and after the palliative radiotherapy.  He denied having any shortness of breath, cough or hemoptysis.  He denied having any fever or chills.  He has no nausea, vomiting, diarrhea or constipation.  He denied having any headache or visual changes.  He has no recent weight loss or night sweats.  He is here today for evaluation before starting cycle #2.   MEDICAL HISTORY: Past Medical History:  Diagnosis Date   Allergic rhinitis, cause unspecified    Anxiety state, unspecified    Asthma    as a child    Chronic airway obstruction, not elsewhere classified    Esophageal reflux    History of kidney stones    History of radiation therapy 02/13/2020-03/24/2020   right lung        Dr Gery Pray   Hypertension    Lumbago    lung ca dx'd 12/2019   Other and unspecified hyperlipidemia    Other chest pain     ALLERGIES:  is allergic to amoxicillin.  MEDICATIONS:  Current Outpatient Medications  Medication Sig Dispense Refill   acetaminophen (TYLENOL) 500 MG tablet Take 1,000 mg by mouth 2 (two) times daily.     Brimonidine Tartrate (LUMIFY) 0.025 % SOLN Place 1 drop into both eyes 2 (two) times a week. As needed     dextromethorphan-guaiFENesin (MUCINEX DM) 30-600 MG 12hr tablet Take 1 tablet by mouth 2 (two) times daily.     doxycycline (VIBRA-TABS) 100 MG tablet Take 1 tablet (100 mg total) by mouth 2 (two) times daily. 60 tablet 1   feeding supplement (ENSURE ENLIVE / ENSURE PLUS) LIQD Take 237 mLs by mouth 3 (three) times daily between meals. (Patient taking differently: Take 237 mLs by mouth 2 (two) times daily between meals.) 237 mL 12   fenofibrate 160 MG tablet Take 1 tablet by mouth once daily (Patient taking differently: Take 160 mg by mouth daily.) 90 tablet 3   ferrous sulfate 325 (65 FE) MG tablet Take 1 tablet (325 mg total) by mouth daily with breakfast.  30 tablet 0   folic acid (FOLVITE) 1 MG tablet Take 1 tablet (1 mg total) by mouth daily. 30 tablet 2   Ipratropium-Albuterol (COMBIVENT RESPIMAT) 20-100 MCG/ACT AERS respimat Inhale 1-2 puffs into the lungs every 6 (six) hours as needed for wheezing. 4 g 3   lidocaine-prilocaine (EMLA) cream Apply 1 application topically as needed. 30 g 2   LORazepam (ATIVAN) 1 MG tablet Take 1 tablet (1 mg total) by mouth See admin instructions. Take one tablet (1 mg) by mouth daily at bedtime, may also take one tablet (1 mg) twice during the day as needed for anxiety (Patient taking differently: Take 1 mg by mouth 3 (three) times daily. Take one  tablet (1 mg) by mouth daily at bedtime, may also take one tablet (1 mg) twice during the day as needed for anxiety) 90 tablet 2   meclizine (ANTIVERT) 25 MG tablet Take 1 tablet (25 mg total) by mouth every 6 (six) hours as needed for dizziness. 60 tablet 0   Multiple Vitamin (MULTIVITAMIN WITH MINERALS) TABS tablet Take 1 tablet by mouth daily.     ondansetron (ZOFRAN) 8 MG tablet Take 1 tablet (8 mg total) by mouth every 8 (eight) hours as needed for nausea or vomiting. Starting day 3 after chemotherapy 30 tablet 2   predniSONE (DELTASONE) 10 MG tablet Take 1 tablet (10 mg total) by mouth daily with breakfast. 90 tablet 0   prochlorperazine (COMPAZINE) 10 MG tablet Take 1 tablet (10 mg total) by mouth every 6 (six) hours as needed. 30 tablet 2   oxyCODONE-acetaminophen (PERCOCET/ROXICET) 5-325 MG tablet Take 1 tablet by mouth every 6 (six) hours as needed for severe pain. (Patient not taking: Reported on 10/15/2020) 30 tablet 0   No current facility-administered medications for this visit.   Facility-Administered Medications Ordered in Other Visits  Medication Dose Route Frequency Provider Last Rate Last Admin   0.9 %  sodium chloride infusion   Intravenous Continuous Curt Bears, MD   Stopped at 03/05/20 1410    SURGICAL HISTORY:  Past Surgical History:  Procedure Laterality Date   APPENDECTOMY     BRONCHIAL BIOPSY  01/07/2020   Procedure: BRONCHIAL BIOPSIES;  Surgeon: Collene Gobble, MD;  Location: [facility] ENDOSCOPY;  Service: Pulmonary;;   BRONCHIAL BIOPSY  04/05/2020   Procedure: BRONCHIAL BIOPSIES;  Surgeon: Rigoberto Noel, MD;  Location: [place];  Service: Cardiopulmonary;;   BRONCHIAL BRUSHINGS  01/07/2020   Procedure: BRONCHIAL BRUSHINGS;  Surgeon: Collene Gobble, MD;  Location[facility] ENDOSCOPY;  Service: Pulmonary;;   BRONCHIAL NEEDLE ASPIRATION BIOPSY  01/07/2020   Procedure: BRONCHIAL NEEDLE ASPIRATION BIOPSIES;  Surgeon: Collene Gobble, MD;  Location: [facility] ENDOSCOPY;  Service:  Pulmonary;;   BRONCHIAL WASHINGS  01/07/2020   Procedure: BRONCHIAL WASHINGS;  Surgeon: Collene Gobble, MD;  Location: [facility] ENDOSCOPY;  Service: Pulmonary;;   BRONCHIAL WASHINGS  04/05/2020   Procedure: BRONCHIAL WASHINGS;  Surgeon: Rigoberto Noel, MD;  Location: [place];  Service: Cardiopulmonary;;   COLONOSCOPY  15 years ago   HEMOSTASIS CONTROL  01/07/2020   Procedure: HEMOSTASIS CONTROL;  Surgeon: Collene Gobble, MD;  Location: [facility] ENDOSCOPY;  Service: Pulmonary;;  cold saline   IR IMAGING GUIDED PORT INSERTION  09/18/2020   NASAL TURBINATE REDUCTION  2002   Dr.Crossley   VASECTOMY     VIDEO BRONCHOSCOPY Right 04/05/2020   Procedure: VIDEO BRONCHOSCOPY WITH FLUORO;  Surgeon: Rigoberto Noel, MD;  Location: [place];  Service: Cardiopulmonary;  Laterality: Right;  VIDEO BRONCHOSCOPY WITH ENDOBRONCHIAL NAVIGATION N/A 01/07/2020   Procedure: VIDEO BRONCHOSCOPY WITH ENDOBRONCHIAL NAVIGATION;  Surgeon: Collene Gobble, MD;  Location: [place] ENDOSCOPY;  Service: Pulmonary;  Laterality: N/A;   VIDEO BRONCHOSCOPY WITH ENDOBRONCHIAL ULTRASOUND N/A 01/07/2020   Procedure: VIDEO BRONCHOSCOPY WITH ENDOBRONCHIAL ULTRASOUND;  Surgeon: Collene Gobble, MD;  Location: [facility] ENDOSCOPY;  Service: Pulmonary;  Laterality: N/A;    REVIEW OF SYSTEMS:  A comprehensive review of systems was negative except for: Constitutional: positive for fatigue Musculoskeletal: positive for bone pain   PHYSICAL EXAMINATION: General appearance: alert, cooperative, fatigued, and no distress Head: Normocephalic, without obvious abnormality, atraumatic Neck: no adenopathy, no JVD, supple, symmetrical, trachea midline, and thyroid not enlarged, symmetric, no tenderness/mass/nodules Lymph nodes: Cervical, supraclavicular, and axillary nodes normal. Resp: clear to auscultation bilaterally Back: symmetric, no curvature. ROM normal. No CVA tenderness. Cardio: regular rate and rhythm, S1, S2 normal, no murmur, click, rub or  gallop GI: soft, non-tender; bowel sounds normal; no masses,  no organomegaly Extremities: extremities normal, atraumatic, no cyanosis or edema  ECOG PERFORMANCE STATUS: 1 - Symptomatic but completely ambulatory  Blood pressure 124/75, pulse (!) 108, temperature 97.8 F (36.6 C), temperature source Tympanic, resp. rate 18, height 5' 10"  (1.778 m), weight 190 lb 1.6 oz (86.2 kg), SpO2 99 %.  LABORATORY DATA: Lab Results  Component Value Date   WBC 4.1 10/15/2020   HGB 11.0 (L) 10/15/2020   HCT 32.9 (L) 10/15/2020   MCV 86.1 10/15/2020   PLT 175 10/15/2020      Chemistry      Component Value Date/Time   NA 139 10/06/2020 0950   K 3.7 10/06/2020 0950   CL 103 10/06/2020 0950   CO2 27 10/06/2020 0950   BUN 13 10/06/2020 0950   CREATININE 0.78 10/06/2020 0950   CREATININE 0.67 (L) 06/24/2020 1517      Component Value Date/Time   CALCIUM 9.6 10/06/2020 0950   ALKPHOS 87 10/06/2020 0950   AST 19 10/06/2020 0950   ALT 15 10/06/2020 0950   BILITOT 0.5 10/06/2020 0950       RADIOGRAPHIC STUDIES: MR Brain W Wo Contrast  Result Date: 10/03/2020 CLINICAL DATA:  Lung Li. Solitary brain metastasis. Planning for SRS. EXAM: MRI HEAD WITHOUT AND WITH CONTRAST TECHNIQUE: Multiplanar, multiecho pulse sequences of the brain and surrounding structures were obtained without and with intravenous contrast. CONTRAST:  39m MULTIHANCE GADOBENATE DIMEGLUMINE 529 MG/ML IV SOLN COMPARISON:  MR head without and with contrast 09/26/2020 FINDINGS: Brain: The rim enhancing inferior right cerebellar metastasis is stable, measuring 9 x 7 x 6 mm. There is surrounding vasogenic edema without significant mass effect. A second punctate focus of enhancement in the right cerebellum on image 46 of series 12 likely represents a second metastasis. Faint enhancement is present in the left frontal lobe on image 130 of series 11. This corresponds to the area previously noted restricted diffusion. The effusion is no  longer restricted. This likely represents a remote lacunar infarct rather than a metastasis. A cortical focus of enhancement is present on image 136 of series 11 and 125 of series 12 consistent with a 3 mm lesion in the posterior left frontal lobe. No acute infarct or hemorrhage is present. No other significant white matter disease is present. The ventricles are of normal size. No significant extraaxial fluid collection is present. The internal auditory canals are within normal limits. The brainstem and cerebellum are otherwise within normal limits. Vascular: Flow is present in the major intracranial arteries. Skull  and upper cervical spine: The craniocervical junction is normal. Upper cervical spine is within normal limits. Marrow signal is unremarkable. Sinuses/Orbits: A polyp or mucous retention cyst is present in the inferior left maxillary sinus. The paranasal sinuses and mastoid air cells are otherwise clear. The globes and orbits are within normal limits. IMPRESSION: 1. Stable 9 x 7 x 6 mm rim enhancing metastasis in the inferior right cerebellar hemisphere with surrounding vasogenic edema but no significant mass effect. 2. Two additional foci of enhancement consistent with additional mets. 3. Additional punctate focus of enhancement in the right cerebellum likely represents a second metastasis. 4. 3 mm cortical focus of enhancement in the posterior left frontal lobe consistent with a metastasis. 5. 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 Electronically Signed   By: San Morelle M.D.   On: 10/03/2020 12:18   MR Brain W Wo Contrast  Result Date: 09/27/2020 CLINICAL DATA:  60 year old male with metastatic non-small cell lung Li. Restaging. EXAM: MRI HEAD WITHOUT AND WITH CONTRAST TECHNIQUE: Multiplanar, multiecho pulse sequences of the brain and surrounding structures were obtained without and with intravenous contrast. CONTRAST:  74m GADAVIST GADOBUTROL 1 MMOL/ML IV SOLN  COMPARISON:  Brain MRI 01/25/2020. FINDINGS: Brain: Solitary new rim enhancing lesion in the central right cerebellum measures 9 mm diameter (series 16, image 33) with mild regional T2 and FLAIR hyperintense edema. No significant posterior fossa mass effect. The lesion is mildly positive on DWI. Elsewhere punctate restricted diffusion in the left centrum semiovale (series 5, image 91) is without associated enhancement, but rather has punctate T2/FLAIR hyperintensity and decreased postcontrast T1 signal. This most resembles a punctate acute lacunar infarct. No other abnormal diffusion or enhancement identified. No dural thickening. Minimal additional nonspecific cerebral white matter T2 and FLAIR hyperintensity with no cortical encephalomalacia. No chronic cerebral blood products identified. No midline shift, ventriculomegaly, extra-axial collection or acute intracranial hemorrhage. Cervicomedullary junction and pituitary are within normal limits. Vascular: Major intracranial vascular flow voids are stable since last year. Dominant left vertebral artery, left transverse and sigmoid sinuses. The major dural venous sinuses demonstrates stable enhancement on postcontrast images. Skull and upper cervical spine: Negative visible cervical spine and spinal cord. Visualized bone marrow signal is within normal limits. Sinuses/Orbits: Stable, negative. Other: Stable mild mastoid effusions. Grossly normal visible internal auditory structures. Negative visible scalp and face. IMPRESSION: 1. Positive for a solitary new 9 mm rim enhancing Metastasis in the central right cerebellum. Mild edema but no mass effect. 2. A superimposed punctate focus of nonenhancing abnormal diffusion in the left centrum semiovale is most compatible with Recent white matter Lacunar Infarct. Attention directed on follow-up to ensure expected resolution. 3. No other metastatic disease or acute intracranial abnormality identified. Electronically Signed    By: HGenevie AnnM.D.   On: 09/27/2020 08:26   IR IMAGING GUIDED PORT INSERTION  Result Date: 09/18/2020 INDICATION: 60year old male with history of non-small cell lung Li requiring central venous access for chemotherapy. EXAM: IMPLANTED PORT A CATH PLACEMENT WITH ULTRASOUND AND FLUOROSCOPIC GUIDANCE COMPARISON:  None. MEDICATIONS: None. ANESTHESIA/SEDATION: Moderate (conscious) sedation was employed during this procedure. A total of Versed 2 mg and Fentanyl 100 mcg was administered intravenously. Moderate Sedation Time: 16 minutes. The patient's level of consciousness and vital signs were monitored continuously by radiology nursing throughout the procedure under my direct supervision. CONTRAST:  None FLUOROSCOPY TIME:  0 minutes, 24 seconds (2 mGy) COMPLICATIONS: None immediate. PROCEDURE: The procedure, risks, benefits, and alternatives were explained to the  patient. Questions regarding the procedure were encouraged and answered. The patient understands and consents to the procedure. The right neck and chest were prepped with chlorhexidine in a sterile fashion, and a sterile drape was applied covering the operative field. Maximum barrier sterile technique with sterile gowns and gloves were used for the procedure. A timeout was performed prior to the initiation of the procedure. Ultrasound was used to examine the jugular vein which was compressible and free of internal echoes. A skin marker was used to demarcate the planned venotomy and port pocket incision sites. Local anesthesia was provided to these sites and the subcutaneous tunnel track with 1% lidocaine with 1:100,000 epinephrine. A small incision was created at the jugular access site and blunt dissection was performed of the subcutaneous tissues. Under ultrasound guidance, the jugular vein was accessed with a 21 ga micropuncture needle and an 0.018" wire was inserted to the superior vena cava. Real-time ultrasound guidance was utilized for vascular  access including the acquisition of a permanent ultrasound image documenting patency of the accessed vessel. A 5 Fr micopuncture set was then used, through which a 0.035" Rosen wire was passed under fluoroscopic guidance into the inferior vena cava. An 8 Fr dilator was then placed over the wire. A subcutaneous port pocket was then created along the upper chest wall utilizing a combination of sharp and blunt dissection. The pocket was irrigated with sterile saline, packed with gauze, and observed for hemorrhage. A single lumen "ISP" sized power injectable port was chosen for placement. The 8 Fr catheter was tunneled from the port pocket site to the venotomy incision. The port was placed in the pocket. The external catheter was trimmed to appropriate length. The dilator was exchanged for an 8 Fr peel-away sheath under fluoroscopic guidance. The catheter was then placed through the sheath and the sheath was removed. Final catheter positioning was confirmed and documented with a fluoroscopic spot radiograph. The port was accessed with a Huber needle, aspirated, and flushed with heparinized saline. The deep dermal layer of the port pocket incision was closed with interrupted 3-0 Vicryl suture. Dermabond was then placed over the port pocket and neck incisions. The patient tolerated the procedure well without immediate post procedural complication. FINDINGS: After catheter placement, the tip lies within the superior cavoatrial junction. The catheter aspirates and flushes normally and is ready for immediate use. IMPRESSION: Successful placement of a power injectable Port-A-Cath via the right internal jugular vein. The catheter is ready for immediate use. Richard Cancer, MD [facility] Rad[facility] Electronically Signed   By: Richard Cancer MD   On: 09/18/2020 10:15     ASSESSMENT AND PLAN: This is a very pleasant 60 years old white male with metastatic non-small cell lung Li  that was initially diagnosed as stage IIb (T3, N0, M0) non-small cell lung Li, adenocarcinoma presented with perihilar right upper lobe lung mass with suspicious groundglass opacity in the right middle lobe.  He had evidence for disease metastasis in March 2022 to the right kidney that was biopsy-proven in April 2022. The patient is not a good surgical candidate for resection according to Dr. Roxan Hockey. The patient underwent a course of concurrent chemoradiation with weekly carboplatin for AUC of 2 and paclitaxel 45 mg/M2 status post 5 cycles.  He has been tolerating this treatment well except for fatigue, mild odynophagia as well as recurrent pneumonia and septicemia.  The patient also has radiation-induced pneumonitis. He was admitted to the hospital several times with recurrent  pneumonia.   The patient had evidence for metastatic disease in April 2022 and he is currently undergoing systemic chemotherapy with carboplatin for AUC of 5, Alimta 500 Mg/M2 and Keytruda 200 Mg IV every 3 weeks status post 1 cycle.  He tolerated the first cycle of his treatment well except for mild fatigue. I recommended for him to proceed with cycle #2 today as planned. He will come back for follow-up visit in 3 weeks for evaluation before starting cycle #3 and will consider him for imaging studies after the next cycle. The patient was advised to call immediately if he has any concerning complaints in the interval. The patient voices understanding of current disease status and treatment options and is in agreement with the current care plan.  All questions were answered. The patient knows to call the clinic with any problems, questions or concerns. We can certainly see the patient much sooner if necessary.  Disclaimer: This note was dictated with voice recognition software. Similar sounding words can inadvertently be transcribed and may not be corrected upon review.

## 2020-10-16 ENCOUNTER — Encounter: Payer: Self-pay | Admitting: Radiation Oncology

## 2020-10-16 ENCOUNTER — Other Ambulatory Visit: Payer: BC Managed Care – PPO | Admitting: *Deleted

## 2020-10-16 NOTE — Progress Notes (Signed)
  Radiation Oncology         [phone] ________________________________  Name: Richard Li MRN: [id number]  Date: 10/13/2020  DOB: July 21, 1960  SPECIAL TREATMENT PROCEDURE  Outpatient    ICD-10-CM   1. Brain metastases ([place])  C79.31       3D TREATMENT PLANNING AND DOSIMETRY:  The patient's radiation plan was reviewed and approved by neurosurgery and radiation oncology prior to treatment.  It showed 3-dimensional radiation distributions overlaid onto the planning CT/MRI image set.  The [facility] for the target structures as well as the organs at risk were reviewed. The documentation of the 3D plan and dosimetry are filed in the radiation oncology EMR.  NARRATIVE:  Richard Li was brought to the TrueBeam stereotactic radiation treatment machine and placed supine on the CT couch. The head frame was applied, and the patient was set up for stereotactic radiosurgery.  Neurosurgery was present for the set-up and delivery  SIMULATION VERIFICATION:  In the couch zero-angle position, the patient underwent Exactrac imaging using the Brainlab system with orthogonal KV images.  These were carefully aligned and repeated to confirm treatment position for each of the isocenters.  The Exactrac snap film verification was repeated at each couch angle.  SPECIAL TREATMENT PROCEDURE: Richard Li received stereotactic radiosurgery to the following targets using 6MV FFF photons:     This constitutes a special treatment procedure due to the ablative dose delivered and the technical nature of treatment.  This highly technical modality of treatment ensures that the ablative dose is centered on the patient's tumor while sparing normal tissues from excessive dose and risk of detrimental effects.  STEREOTACTIC TREATMENT MANAGEMENT:  Following delivery, the patient was transported to nursing in stable condition and monitored for possible acute effects.  Vital signs were recorded BP 125/73 (BP Location: Left  Arm, Patient Position: Sitting, Cuff Size: Normal)   Pulse (!) 104   Temp 97.8 F (36.6 C)   Resp 20   SpO2 100% . The patient tolerated treatment without significant acute effects, and was discharged to home in stable condition.    PLAN: Follow-up in one month. ________________________________   Eppie Gibson, MD

## 2020-10-19 ENCOUNTER — Encounter: Payer: Self-pay | Admitting: Internal Medicine

## 2020-10-20 ENCOUNTER — Telehealth: Payer: Self-pay | Admitting: Pulmonary Disease

## 2020-10-20 ENCOUNTER — Encounter: Payer: Self-pay | Admitting: Internal Medicine

## 2020-10-20 ENCOUNTER — Other Ambulatory Visit: Payer: BC Managed Care – PPO

## 2020-10-20 ENCOUNTER — Other Ambulatory Visit: Payer: Self-pay | Admitting: Internal Medicine

## 2020-10-20 DIAGNOSIS — C3491 Malignant neoplasm of unspecified part of right bronchus or lung: Secondary | ICD-10-CM

## 2020-10-20 MED ORDER — OXYCODONE-ACETAMINOPHEN 5-325 MG PO TABS: 1.0000 | ORAL_TABLET | Freq: Four times a day (QID) | ORAL | 0 refills | Status: DC | PRN

## 2020-10-20 NOTE — Telephone Encounter (Signed)
Rec'd disability forms - preparing forms for Dr. Kavin Leech signature. -pr

## 2020-10-20 NOTE — Telephone Encounter (Signed)
Pt is requesting a refill of the following:  Percocet

## 2020-10-21 ENCOUNTER — Ambulatory Visit (HOSPITAL_COMMUNITY)
Admission: RE | Admit: 2020-10-21 | Discharge: 2020-10-21 | Disposition: A | Discharge status: Home / Self Care | Payer: BC Managed Care – PPO | Source: Ambulatory Visit | Attending: Internal Medicine | Admitting: Internal Medicine

## 2020-10-21 ENCOUNTER — Other Ambulatory Visit: Payer: Self-pay

## 2020-10-21 ENCOUNTER — Inpatient Hospital Stay: Payer: BC Managed Care – PPO

## 2020-10-21 ENCOUNTER — Other Ambulatory Visit: Payer: BC Managed Care – PPO

## 2020-10-21 DIAGNOSIS — C3490 Malignant neoplasm of unspecified part of unspecified bronchus or lung: Secondary | ICD-10-CM | POA: Diagnosis present

## 2020-10-21 DIAGNOSIS — C3482 Malignant neoplasm of overlapping sites of left bronchus and lung: Secondary | ICD-10-CM | POA: Diagnosis not present

## 2020-10-21 DIAGNOSIS — C3491 Malignant neoplasm of unspecified part of right bronchus or lung: Secondary | ICD-10-CM

## 2020-10-21 DIAGNOSIS — E860 Dehydration: Secondary | ICD-10-CM

## 2020-10-21 LAB — CBC WITH DIFFERENTIAL (CANCER CENTER ONLY)
Abs Immature Granulocytes: 0.02 10*3/uL (ref 0.00–0.07)
Basophils Absolute: 0 10*3/uL (ref 0.0–0.1)
Basophils Relative: 0 %
Eosinophils Absolute: 0 10*3/uL (ref 0.0–0.5)
Eosinophils Relative: 1 %
HCT: 27.3 % — ABNORMAL LOW (ref 39.0–52.0)
Hemoglobin: 9.5 g/dL — ABNORMAL LOW (ref 13.0–17.0)
Immature Granulocytes: 1 %
Lymphocytes Relative: 7 %
Lymphs Abs: 0.2 10*3/uL — ABNORMAL LOW (ref 0.7–4.0)
MCH: 29.8 pg (ref 26.0–34.0)
MCHC: 34.8 g/dL (ref 30.0–36.0)
MCV: 85.6 fL (ref 80.0–100.0)
Monocytes Absolute: 0.2 10*3/uL (ref 0.1–1.0)
Monocytes Relative: 7 %
Neutro Abs: 2.8 10*3/uL (ref 1.7–7.7)
Neutrophils Relative %: 84 %
Platelet Count: 122 10*3/uL — ABNORMAL LOW (ref 150–400)
RBC: 3.19 MIL/uL — ABNORMAL LOW (ref 4.22–5.81)
RDW: 15.5 % (ref 11.5–15.5)
WBC Count: 3.3 10*3/uL — ABNORMAL LOW (ref 4.0–10.5)
nRBC: 0 % (ref 0.0–0.2)

## 2020-10-21 LAB — CMP (CANCER CENTER ONLY)
ALT: 21 U/L (ref 0–44)
AST: 29 U/L (ref 15–41)
Albumin: 3.4 g/dL — ABNORMAL LOW (ref 3.5–5.0)
Alkaline Phosphatase: 88 U/L (ref 38–126)
Anion gap: 9 (ref 5–15)
BUN: 19 mg/dL (ref 6–20)
CO2: 26 mmol/L (ref 22–32)
Calcium: 9.4 mg/dL (ref 8.9–10.3)
Chloride: 102 mmol/L (ref 98–111)
Creatinine: 0.84 mg/dL (ref 0.61–1.24)
GFR, Estimated: >60 mL/min (ref >60)
Glucose, Bld: 102 mg/dL — ABNORMAL HIGH (ref 70–99)
Potassium: 4.2 mmol/L (ref 3.5–5.1)
Sodium: 137 mmol/L (ref 135–145)
Total Bilirubin: 0.7 mg/dL (ref 0.3–1.2)
Total Protein: 6.8 g/dL (ref 6.5–8.1)

## 2020-10-21 IMAGING — CT CT CHEST W/ CM
2 of 4 series · 15 of 36 positions shown, 18 images · IV contrast (omnipaque)
Comparison: Most recent CT chest [DATE].  [DATE] PET-CT.

CLINICAL DATA: Primary Cancer Type: Lung
TECHNIQUE: Multidetector CT imaging of the chest was performed during
intravenous contrast administration.

CONTRAST:  75mL OMNIPAQUE IOHEXOL 300 MG/ML  SOLN

[Series 2: axial st · axial · 0.78mm/px · z∈[-389,-85]mm · 12 of 180 slices shown, 15 images]
[im 14/180  mediastinal]
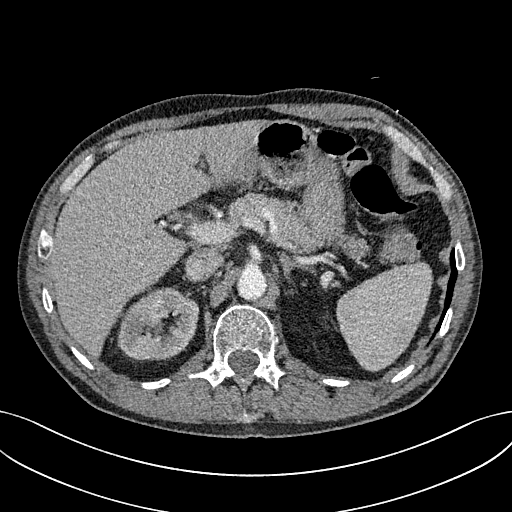
[im 14/180  lung]
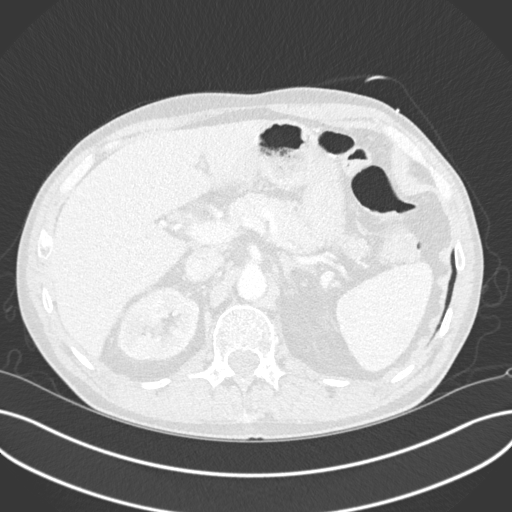
[im 28/180  lung]
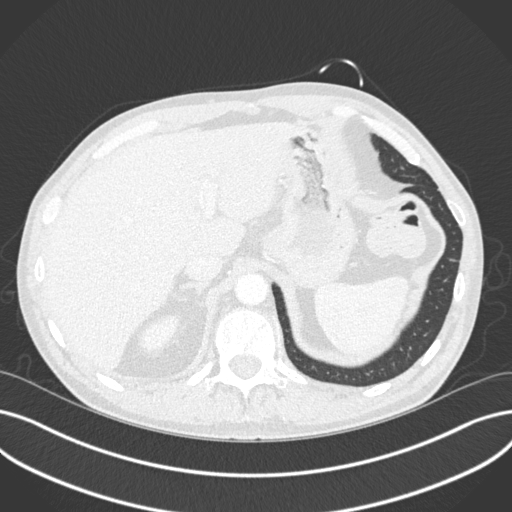
[im 42/180  lung]
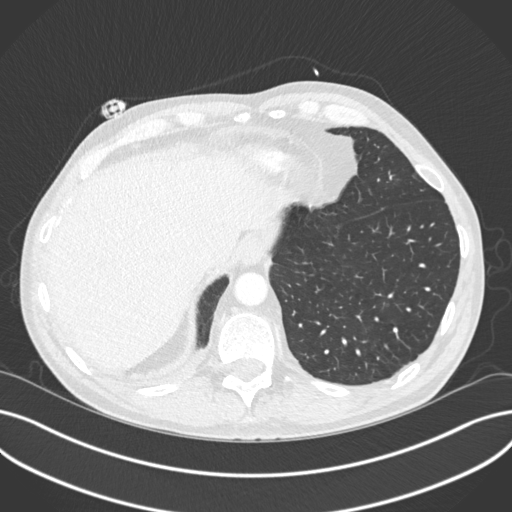
[im 56/180  lung]
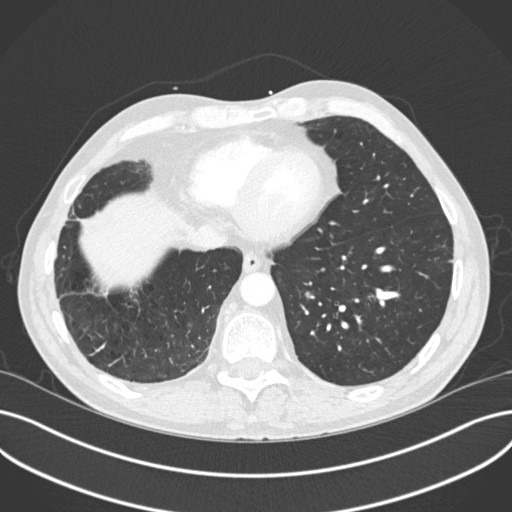
[im 69/180  mediastinal]
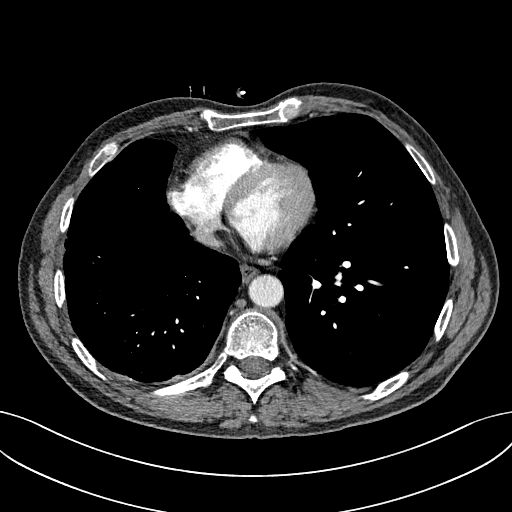
[im 69/180  lung]
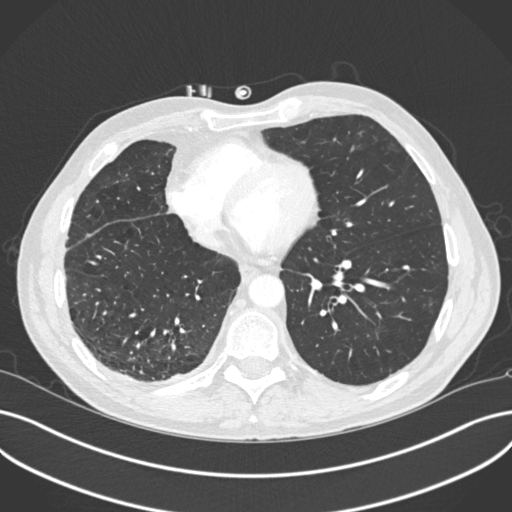
[im 83/180  lung]
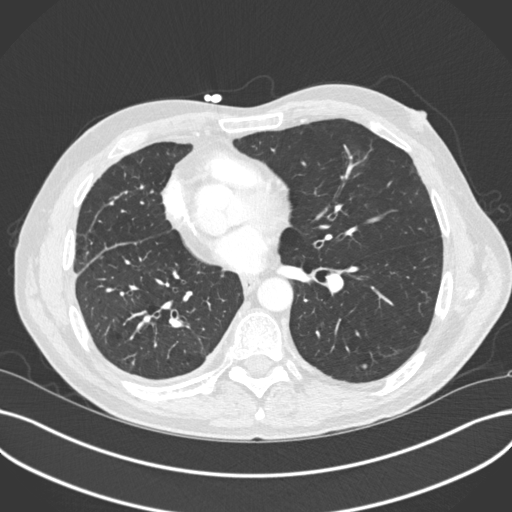
[im 97/180  lung]
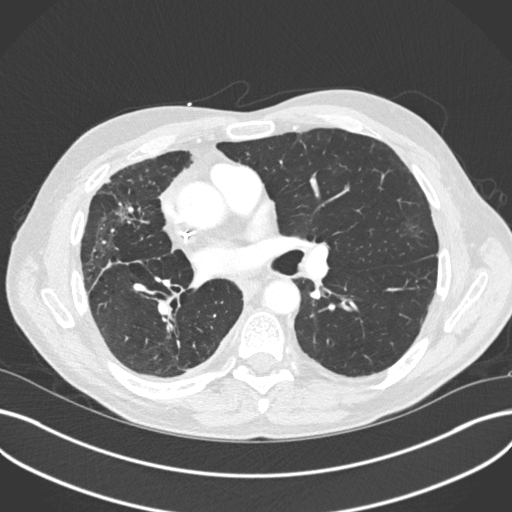
[im 111/180  lung]
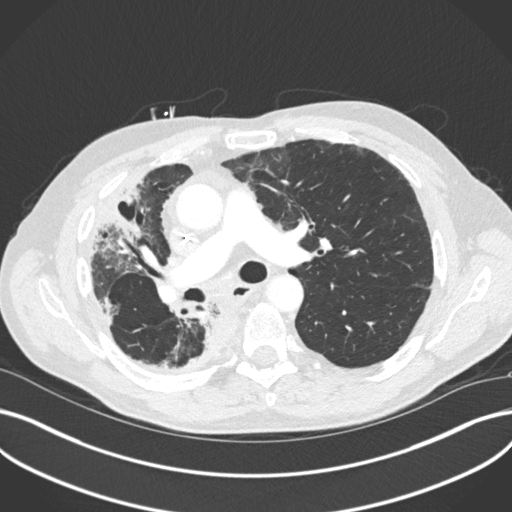
[im 124/180  mediastinal]
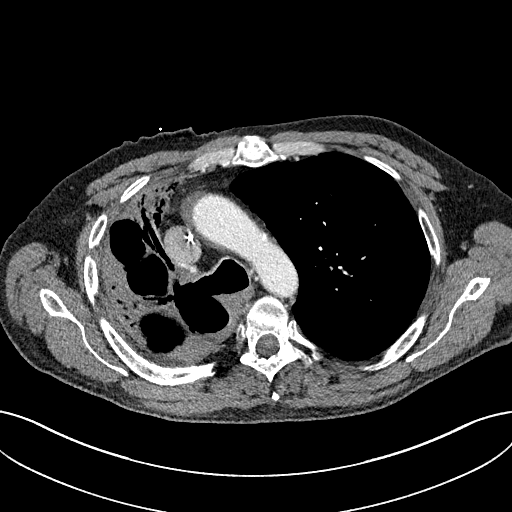
[im 124/180  lung]
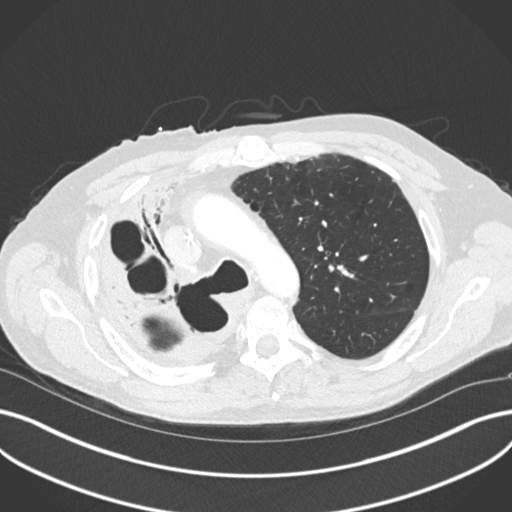
[im 138/180  lung]
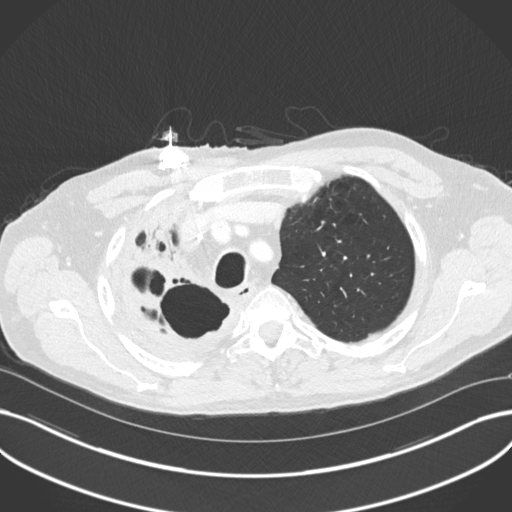
[im 152/180  lung]
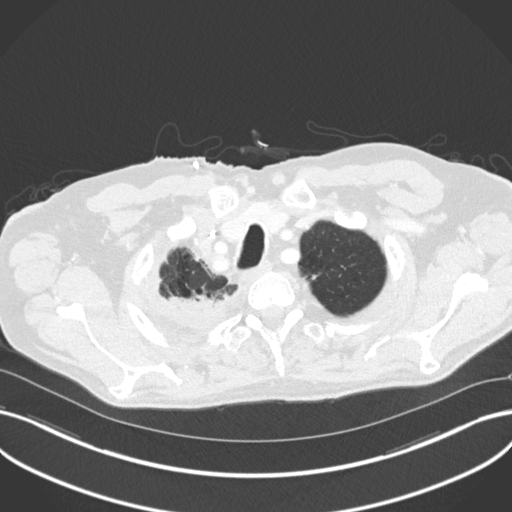
[im 166/180  lung]
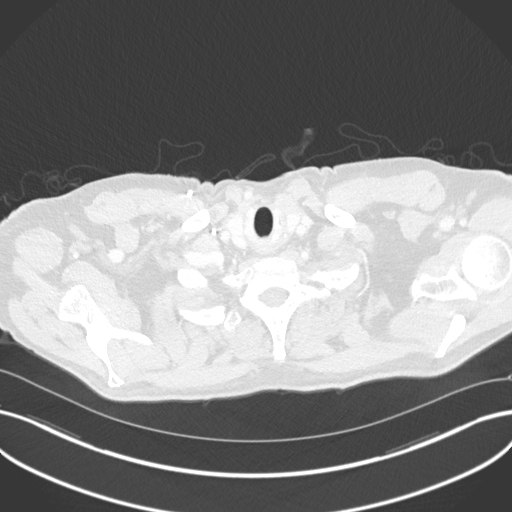

[Series 6: coronal · coronal · 0.71mm/px · 3 of 138 slices shown]
[im 28/138  lung]
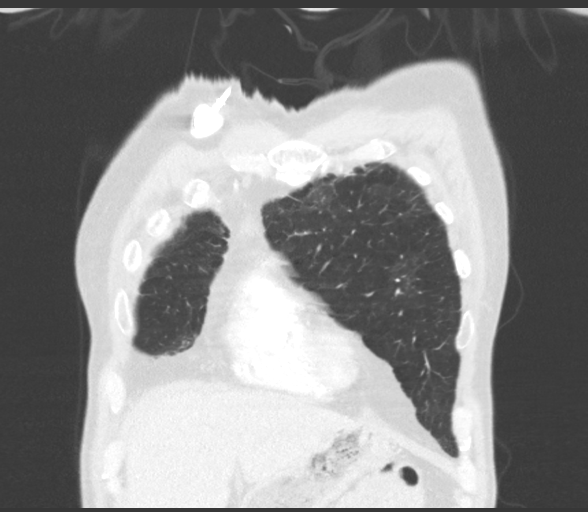
[im 55/138  lung]
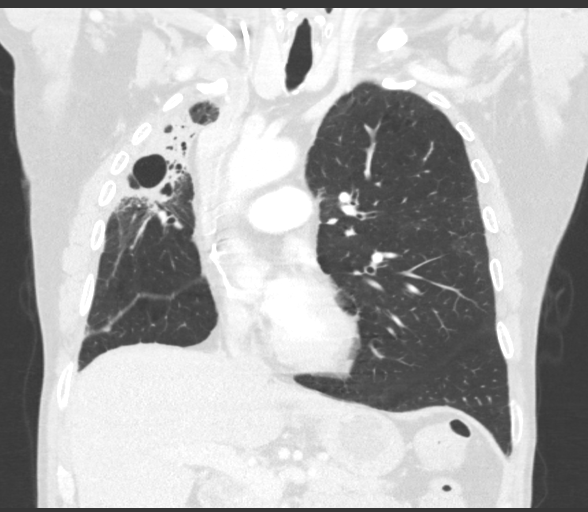
[im 83/138  lung]
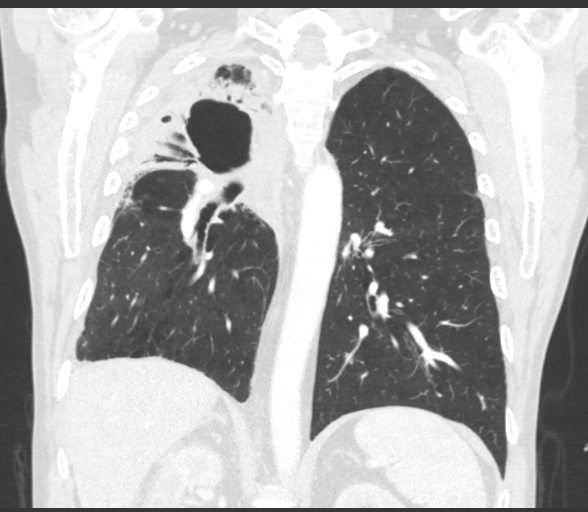

[15 of 36 positions shown; findings below may reference images not displayed]

Imaging Indication: Assess response to therapy

Interval therapy since last imaging? Yes

Initial Cancer Diagnosis

Date: [DATE]; Established by: Biopsy-proven

Detailed Pathology: Stage IIB non-small cell lung cancer,
adenocarcinoma.

Primary Tumor location: Perihilar right lung mass. Osseous, brain,
and right kidney metastases.

Surgeries: Appendectomy.

Chemotherapy: Yes; Ongoing? Yes; Most recent administration:
[DATE]

Immunotherapy?  Yes; Type: Keytruda; Ongoing? Yes

Radiation therapy? Yes

Date Range: [DATE]; Target: Brain

Date Range: [DATE] - [DATE]; Target: Left chest and spine

Date Range: [DATE] - [DATE]; Target: Right lung

EXAM:
CT CHEST WITH CONTRAST
FINDINGS: Cardiovascular: Port in the anterior chest wall with tip in distal
SVC. No acute findings of the aorta great vessels. Truncation of the
LEFT upper lobe pulmonary artery related to prior surgery.

Mediastinum/Nodes: No axillary or supraclavicular adenopathy. No
mediastinal or hilar adenopathy. No pericardial fluid. Esophagus
normal. Small hypermetabolic pleural metastasis seen on comparison
PET-CT scan are less conspicuous

Consolidation with cavitation in the superior RIGHT upper lobe
unchanged. No new nodularity.

Lungs/Pleura:

Upper Abdomen: Hepatic metastasis measuring 11 mm (image 141/2)
appears decreased from 16 mm. No new lesions in liver.

Infiltrative mass in the upper pole of the RIGHT kidney is again
demonstrated concerning for metastasis. Adrenal glands normal.

Musculoskeletal: New superior endplate compression fracture T12 site
of prior hypermetabolic skeletal metastasis. No new skeletal lesions
are identified however lesions were relatively occult by CT imaging
on comparison FDG PET scan.
IMPRESSION: 1. No evidence of metastatic lung cancer progression.
2. Stable consolidation and cavitation in the superior aspect of the
RIGHT upper lobe.
3. Previously hypermetabolic small hypermetabolic pleural nodules
are less conspicuous.
4. LEFT hepatic lobe metastatic lesion appears slightly decreased in
size.
5. RIGHT renal metastasis is incompletely imaged.
6. No clear progression of skeletal metastasis however lesions were
relatively occult on the CT portion of prior FDG PET scan. New
superior endplate compression deformity at metastatic lesion at T12.

## 2020-10-21 MED ORDER — SODIUM CHLORIDE (PF) 0.9 % IJ SOLN
INTRAMUSCULAR | Status: AC
  Filled 2020-10-21: qty 50

## 2020-10-21 MED ORDER — SODIUM CHLORIDE 0.9% FLUSH
10.0000 mL | Freq: Once | INTRAVENOUS | Status: DC | PRN
  Filled 2020-10-21: qty 10

## 2020-10-21 MED ORDER — HEPARIN SOD (PORK) LOCK FLUSH 100 UNIT/ML IV SOLN
500.0000 [IU] | Freq: Once | INTRAVENOUS | Status: AC
  Administered 2020-10-21: 500 [IU] via INTRAVENOUS

## 2020-10-21 MED ORDER — HEPARIN SOD (PORK) LOCK FLUSH 100 UNIT/ML IV SOLN
INTRAVENOUS | Status: AC
  Filled 2020-10-21: qty 5

## 2020-10-21 MED ORDER — IOHEXOL 300 MG/ML  SOLN
75.0000 mL | Freq: Once | INTRAMUSCULAR | Status: AC | PRN
  Administered 2020-10-21: 75 mL via INTRAVENOUS

## 2020-10-22 ENCOUNTER — Telehealth: Payer: Self-pay

## 2020-10-22 NOTE — Telephone Encounter (Signed)
Patient notified that Disability forms were completed and signed by Provider and sent to [facility] and [employer] as requested. Copies were mailed to Patient as requested.

## 2020-10-27 ENCOUNTER — Inpatient Hospital Stay: Payer: BC Managed Care – PPO | Attending: Internal Medicine | Admitting: Internal Medicine

## 2020-10-27 ENCOUNTER — Other Ambulatory Visit: Payer: Self-pay

## 2020-10-27 ENCOUNTER — Other Ambulatory Visit: Payer: Self-pay | Admitting: Pulmonary Disease

## 2020-10-27 VITALS — BP 134/63 | HR 88 | Temp 98.2°F | Resp 20 | Ht 70.0 in | Wt 192.6 lb

## 2020-10-27 DIAGNOSIS — C3482 Malignant neoplasm of overlapping sites of left bronchus and lung: Secondary | ICD-10-CM | POA: Diagnosis present

## 2020-10-27 DIAGNOSIS — Z79899 Other long term (current) drug therapy: Secondary | ICD-10-CM | POA: Insufficient documentation

## 2020-10-27 DIAGNOSIS — Z5112 Encounter for antineoplastic immunotherapy: Secondary | ICD-10-CM | POA: Diagnosis present

## 2020-10-27 DIAGNOSIS — C3411 Malignant neoplasm of upper lobe, right bronchus or lung: Secondary | ICD-10-CM | POA: Diagnosis not present

## 2020-10-27 DIAGNOSIS — Z5111 Encounter for antineoplastic chemotherapy: Secondary | ICD-10-CM

## 2020-10-27 DIAGNOSIS — Z923 Personal history of irradiation: Secondary | ICD-10-CM | POA: Insufficient documentation

## 2020-10-27 DIAGNOSIS — I10 Essential (primary) hypertension: Secondary | ICD-10-CM | POA: Diagnosis not present

## 2020-10-27 DIAGNOSIS — C7901 Secondary malignant neoplasm of right kidney and renal pelvis: Secondary | ICD-10-CM | POA: Diagnosis not present

## 2020-10-27 DIAGNOSIS — C7931 Secondary malignant neoplasm of brain: Secondary | ICD-10-CM

## 2020-10-27 NOTE — Progress Notes (Signed)
[facility] Telephone:([phone]) [phone]   Fax:(336) [phone]  OFFICE PROGRESS NOTE  [name], DO [place]  [address] [place] [address]  DIAGNOSIS: Metastatic non-small cell lung cancer initially diagnosed as stage IIB (T3, N0, M0) non-small cell lung cancer, adenocarcinoma presented with large central perihilar mass with suspicious groundglass opacity in the right middle lobe and left upper lobe diagnosed in September 2021.  The patient had evidence of metastatic disease to the right kidney in March 2022.  Molecular studies by guardant 360: No actionable mutations.   PDl1: 80%   PRIOR THERAPY: Concurrent chemoradiation with weekly carboplatin for AUC of 2 and paclitaxel 45 mg/M2.  First dose February 10, 2020.  Status post 5  cycles.   CURRENT THERAPY:  1) Palliative systemic chemotherapy with carboplatin AUC of 5, Alimta 500 mg per metered squared, Keytruda 200 mg IV every 3 weeks.  First dose expected on 09/24/2020. Status post 2 cycles.  2) Palliative radiation to the left anterior rib cage area as well as the mid back area under the care of Dr. Sondra Come. Last treatment on 10/05/20.  INTERVAL HISTORY: Richard Li 60 y.o. male returns to the clinic today for follow-up visit accompanied by his wife.  The patient is feeling fine with no concerning complaints except for fatigue and lack of stamina to do anything.  He denied having any current chest pain, shortness of breath, cough or hemoptysis.  He denied having any fever or chills.  He has no nausea, vomiting, diarrhea or constipation.  He has no headache or visual changes.  He has no recent weight loss or night sweats.  He continues to tolerate his systemic chemotherapy fairly well.  He had repeat CT scan of the chest that was previously scheduled before his disease recurrence and he is here for evaluation and discussion of his scan results.    MEDICAL HISTORY: Past Medical History:  Diagnosis  Date   Allergic rhinitis, cause unspecified    Anxiety state, unspecified    Asthma    as a child   Chronic airway obstruction, not elsewhere classified    Esophageal reflux    History of kidney stones    History of radiation therapy 02/13/2020-03/24/2020   right lung        Dr Gery Pray   Hypertension    Lumbago    lung ca dx'd 12/2019   Other and unspecified hyperlipidemia    Other chest pain     ALLERGIES:  is allergic to amoxicillin.  MEDICATIONS:  Current Outpatient Medications  Medication Sig Dispense Refill   acetaminophen (TYLENOL) 500 MG tablet Take 1,000 mg by mouth 2 (two) times daily.     Brimonidine Tartrate (LUMIFY) 0.025 % SOLN Place 1 drop into both eyes 2 (two) times a week. As needed     dextromethorphan-guaiFENesin (MUCINEX DM) 30-600 MG 12hr tablet Take 1 tablet by mouth 2 (two) times daily.     doxycycline (VIBRA-TABS) 100 MG tablet Take 1 tablet by mouth twice daily 60 tablet 0   feeding supplement (ENSURE ENLIVE / ENSURE PLUS) LIQD Take 237 mLs by mouth 3 (three) times daily between meals. (Patient taking differently: Take 237 mLs by mouth 2 (two) times daily between meals.) 237 mL 12   fenofibrate 160 MG tablet Take 1 tablet by mouth once daily (Patient taking differently: Take 160 mg by mouth daily.) 90 tablet 3   ferrous sulfate 325 (65 FE) MG tablet  Take 1 tablet (325 mg total) by mouth daily with breakfast. 30 tablet 0   folic acid (FOLVITE) 1 MG tablet Take 1 tablet (1 mg total) by mouth daily. 30 tablet 2   Ipratropium-Albuterol (COMBIVENT RESPIMAT) 20-100 MCG/ACT AERS respimat Inhale 1-2 puffs into the lungs every 6 (six) hours as needed for wheezing. 4 g 3   lidocaine-prilocaine (EMLA) cream Apply 1 application topically as needed. 30 g 2   LORazepam (ATIVAN) 1 MG tablet Take 1 tablet (1 mg total) by mouth See admin instructions. Take one tablet (1 mg) by mouth daily at bedtime, may also take one tablet (1 mg) twice during the day as needed for  anxiety (Patient taking differently: Take 1 mg by mouth 3 (three) times daily. Take one tablet (1 mg) by mouth daily at bedtime, may also take one tablet (1 mg) twice during the day as needed for anxiety) 90 tablet 2   meclizine (ANTIVERT) 25 MG tablet Take 1 tablet (25 mg total) by mouth every 6 (six) hours as needed for dizziness. 60 tablet 0   Multiple Vitamin (MULTIVITAMIN WITH MINERALS) TABS tablet Take 1 tablet by mouth daily.     ondansetron (ZOFRAN) 8 MG tablet Take 1 tablet (8 mg total) by mouth every 8 (eight) hours as needed for nausea or vomiting. Starting day 3 after chemotherapy 30 tablet 2   oxyCODONE-acetaminophen (PERCOCET/ROXICET) 5-325 MG tablet Take 1 tablet by mouth every 6 (six) hours as needed for severe pain. 30 tablet 0   predniSONE (DELTASONE) 10 MG tablet Take 1 tablet (10 mg total) by mouth daily with breakfast. 90 tablet 0   prochlorperazine (COMPAZINE) 10 MG tablet Take 1 tablet (10 mg total) by mouth every 6 (six) hours as needed. 30 tablet 2   No current facility-administered medications for this visit.   Facility-Administered Medications Ordered in Other Visits  Medication Dose Route Frequency Provider Last Rate Last Admin   0.9 %  sodium chloride infusion   Intravenous Continuous Curt Bears, MD   Stopped at 03/05/20 1410    SURGICAL HISTORY:  Past Surgical History:  Procedure Laterality Date   APPENDECTOMY     BRONCHIAL BIOPSY  01/07/2020   Procedure: BRONCHIAL BIOPSIES;  Surgeon: Collene Gobble, MD;  Location: [facility] ENDOSCOPY;  Service: Pulmonary;;   BRONCHIAL BIOPSY  04/05/2020   Procedure: BRONCHIAL BIOPSIES;  Surgeon: Rigoberto Noel, MD;  Location: [facility];  Service: Cardiopulmonary;;   BRONCHIAL BRUSHINGS  01/07/2020   Procedure: BRONCHIAL BRUSHINGS;  Surgeon: Collene Gobble, MD;  Location: [facility] ENDOSCOPY;  Service: Pulmonary;;   BRONCHIAL NEEDLE ASPIRATION BIOPSY  01/07/2020   Procedure: BRONCHIAL NEEDLE ASPIRATION BIOPSIES;  Surgeon: Collene Gobble, MD;  Location: [facility] ENDOSCOPY;  Service: Pulmonary;;   BRONCHIAL WASHINGS  01/07/2020   Procedure: BRONCHIAL WASHINGS;  Surgeon: Collene Gobble, MD;  Location: [facility] ENDOSCOPY;  Service: Pulmonary;;   BRONCHIAL WASHINGS  04/05/2020   Procedure: BRONCHIAL WASHINGS;  Surgeon: Rigoberto Noel, MD;  Location: [facility];  Service: Cardiopulmonary;;   COLONOSCOPY  15 years ago   HEMOSTASIS CONTROL  01/07/2020   Procedure: HEMOSTASIS CONTROL;  Surgeon: Collene Gobble, MD;  Location: [facility] ENDOSCOPY;  Service: Pulmonary;;  cold saline   IR IMAGING GUIDED PORT INSERTION  09/18/2020   NASAL TURBINATE REDUCTION  2002   Dr.Crossley   VASECTOMY     VIDEO BRONCHOSCOPY Right 04/05/2020   Procedure: VIDEO BRONCHOSCOPY WITH FLUORO;  Surgeon: Rigoberto Noel, MD;  Location: [facility];  Service: Cardiopulmonary;  Laterality: Right;   VIDEO BRONCHOSCOPY WITH ENDOBRONCHIAL NAVIGATION N/A 01/07/2020   Procedure: VIDEO BRONCHOSCOPY WITH ENDOBRONCHIAL NAVIGATION;  Surgeon: Collene Gobble, MD;  Location: [facility] ENDOSCOPY;  Service: Pulmonary;  Laterality: N/A;   VIDEO BRONCHOSCOPY WITH ENDOBRONCHIAL ULTRASOUND N/A 01/07/2020   Procedure: VIDEO BRONCHOSCOPY WITH ENDOBRONCHIAL ULTRASOUND;  Surgeon: Collene Gobble, MD;  Location: [place] ENDOSCOPY;  Service: Pulmonary;  Laterality: N/A;    REVIEW OF SYSTEMS:  Constitutional: positive for fatigue Eyes: negative Ears, nose, mouth, throat, and face: negative Respiratory: negative Cardiovascular: negative Gastrointestinal: negative Genitourinary:negative Integument/breast: negative Hematologic/lymphatic: negative Musculoskeletal:positive for bone pain Neurological: negative Behavioral/Psych: negative Endocrine: negative Allergic/Immunologic: negative   PHYSICAL EXAMINATION: General appearance: alert, cooperative, fatigued, and no distress Head: Normocephalic, without obvious abnormality, atraumatic Neck: no adenopathy, no JVD, supple, symmetrical, trachea midline,  and thyroid not enlarged, symmetric, no tenderness/mass/nodules Lymph nodes: Cervical, supraclavicular, and axillary nodes normal. Resp: clear to auscultation bilaterally Back: symmetric, no curvature. ROM normal. No CVA tenderness. Cardio: regular rate and rhythm, S1, S2 normal, no murmur, click, rub or gallop GI: soft, non-tender; bowel sounds normal; no masses,  no organomegaly Extremities: extremities normal, atraumatic, no cyanosis or edema Neurologic: Alert and oriented X 3, normal strength and tone. Normal symmetric reflexes. Normal coordination and gait  ECOG PERFORMANCE STATUS: 1 - Symptomatic but completely ambulatory  Blood pressure 134/63, pulse 88, temperature 98.2 F (36.8 C), temperature source Tympanic, resp. rate 20, height 5' 10"  (1.778 m), weight 192 lb 9.6 oz (87.4 kg), SpO2 100 %.  LABORATORY DATA: Lab Results  Component Value Date   WBC 3.3 (L) 10/21/2020   HGB 9.5 (L) 10/21/2020   HCT 27.3 (L) 10/21/2020   MCV 85.6 10/21/2020   PLT 122 (L) 10/21/2020      Chemistry      Component Value Date/Time   NA 137 10/21/2020 1415   K 4.2 10/21/2020 1415   CL 102 10/21/2020 1415   CO2 26 10/21/2020 1415   BUN 19 10/21/2020 1415   CREATININE 0.84 10/21/2020 1415   CREATININE 0.67 (L) 06/24/2020 1517      Component Value Date/Time   CALCIUM 9.4 10/21/2020 1415   ALKPHOS 88 10/21/2020 1415   AST 29 10/21/2020 1415   ALT 21 10/21/2020 1415   BILITOT 0.7 10/21/2020 1415       RADIOGRAPHIC STUDIES: CT Chest W Contrast  Result Date: 10/22/2020 CLINICAL DATA:  Primary Cancer Type: Lung Imaging Indication: Assess response to therapy Interval therapy since last imaging? Yes Initial Cancer Diagnosis Date: 01/07/2020; Established by: Biopsy-proven Detailed Pathology: Stage IIB non-small cell lung cancer, adenocarcinoma. Primary Tumor location: Perihilar right lung mass. Osseous, brain, and right kidney metastases. Surgeries: Appendectomy. Chemotherapy: Yes; Ongoing?  Yes; Most recent administration: 10/15/2020 Immunotherapy?  Yes; Type: Keytruda; Ongoing? Yes Radiation therapy? Yes Date Range: 10/13/2020; Target: Brain Date Range: 09/22/2020 - 10/05/2020; Target: Left chest and spine Date Range: 02/12/2020 - 03/24/2020; Target: Right lung EXAM: CT CHEST WITH CONTRAST TECHNIQUE: Multidetector CT imaging of the chest was performed during intravenous contrast administration. CONTRAST:  37m OMNIPAQUE IOHEXOL 300 MG/ML  SOLN COMPARISON:  Most recent CT chest 07/20/2020.  09/10/2020 PET-CT. FINDINGS: Cardiovascular: Port in the anterior chest wall with tip in distal SVC. No acute findings of the aorta great vessels. Truncation of the LEFT upper lobe pulmonary artery related to prior surgery. Mediastinum/Nodes: No axillary or supraclavicular adenopathy. No mediastinal or hilar adenopathy. No pericardial fluid. Esophagus normal. Small hypermetabolic pleural metastasis seen on comparison PET-CT scan  are less conspicuous Consolidation with cavitation in the superior RIGHT upper lobe unchanged. No new nodularity. Lungs/Pleura: Upper Abdomen: Hepatic metastasis measuring 11 mm (image 141/2) appears decreased from 16 mm. No new lesions in liver. Infiltrative mass in the upper pole of the RIGHT kidney is again demonstrated concerning for metastasis. Adrenal glands normal. Musculoskeletal: New superior endplate compression fracture T12 site of prior hypermetabolic skeletal metastasis. No new skeletal lesions are identified however lesions were relatively occult by CT imaging on comparison FDG PET scan. IMPRESSION: 1. No evidence of metastatic lung cancer progression. 2. Stable consolidation and cavitation in the superior aspect of the RIGHT upper lobe. 3. Previously hypermetabolic small hypermetabolic pleural nodules are less conspicuous. 4. LEFT hepatic lobe metastatic lesion appears slightly decreased in size. 5. RIGHT renal metastasis is incompletely imaged. 6. No clear progression of  skeletal metastasis however lesions were relatively occult on the CT portion of prior FDG PET scan. New superior endplate compression deformity at metastatic lesion at T12. Electronically Signed   By: Suzy Bouchard M.D.   On: 10/22/2020 10:21   MR Brain W Wo Contrast  Result Date: 10/03/2020 CLINICAL DATA:  Lung cancer. Solitary brain metastasis. Planning for SRS. EXAM: MRI HEAD WITHOUT AND WITH CONTRAST TECHNIQUE: Multiplanar, multiecho pulse sequences of the brain and surrounding structures were obtained without and with intravenous contrast. CONTRAST:  73m MULTIHANCE GADOBENATE DIMEGLUMINE 529 MG/ML IV SOLN COMPARISON:  MR head without and with contrast 09/26/2020 FINDINGS: Brain: The rim enhancing inferior right cerebellar metastasis is stable, measuring 9 x 7 x 6 mm. There is surrounding vasogenic edema without significant mass effect. A second punctate focus of enhancement in the right cerebellum on image 46 of series 12 likely represents a second metastasis. Faint enhancement is present in the left frontal lobe on image 130 of series 11. This corresponds to the area previously noted restricted diffusion. The effusion is no longer restricted. This likely represents a remote lacunar infarct rather than a metastasis. A cortical focus of enhancement is present on image 136 of series 11 and 125 of series 12 consistent with a 3 mm lesion in the posterior left frontal lobe. No acute infarct or hemorrhage is present. No other significant white matter disease is present. The ventricles are of normal size. No significant extraaxial fluid collection is present. The internal auditory canals are within normal limits. The brainstem and cerebellum are otherwise within normal limits. Vascular: Flow is present in the major intracranial arteries. Skull and upper cervical spine: The craniocervical junction is normal. Upper cervical spine is within normal limits. Marrow signal is unremarkable. Sinuses/Orbits: A polyp or  mucous retention cyst is present in the inferior left maxillary sinus. The paranasal sinuses and mastoid air cells are otherwise clear. The globes and orbits are within normal limits. IMPRESSION: 1. Stable 9 x 7 x 6 mm rim enhancing metastasis in the inferior right cerebellar hemisphere with surrounding vasogenic edema but no significant mass effect. 2. Two additional foci of enhancement consistent with additional mets. 3. Additional punctate focus of enhancement in the right cerebellum likely represents a second metastasis. 4. 3 mm cortical focus of enhancement in the posterior left frontal lobe consistent with a metastasis. 5. 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 Electronically Signed   By: CSan MorelleM.D.   On: 10/03/2020 12:18     ASSESSMENT AND PLAN: This is a very pleasant 60years old white male with metastatic non-small cell lung cancer that was initially diagnosed as stage  IIb (T3, N0, M0) non-small cell lung cancer, adenocarcinoma presented with perihilar right upper lobe lung mass with suspicious groundglass opacity in the right middle lobe.  He had evidence for disease metastasis in March 2022 to the right kidney that was biopsy-proven in April 2022. The patient is not a good surgical candidate for resection according to Dr. Roxan Hockey. The patient underwent a course of concurrent chemoradiation with weekly carboplatin for AUC of 2 and paclitaxel 45 mg/M2 status post 5 cycles.  He has been tolerating this treatment well except for fatigue, mild odynophagia as well as recurrent pneumonia and septicemia.  The patient also has radiation-induced pneumonitis. He was admitted to the hospital several times with recurrent pneumonia.   The patient had evidence for metastatic disease in April 2022 and he is currently undergoing systemic chemotherapy with carboplatin for AUC of 5, Alimta 500 Mg/M2 and Keytruda 200 Mg IV every 3 weeks status post 2 cycles. The patient has been  tolerating this treatment well with no concerning adverse effect except for fatigue. He had repeat CT scan of the chest performed recently.  I personally and independently reviewed the scans and discussed the results with the patient and his wife. His scan showed improvement of his disease after starting the systemic chemotherapy. I recommended for the patient to continue his treatment as planned and he is expected to start cycle #3 on November 05, 2020. He will come back for follow-up visit at that time. We will repeat imaging studies including CT scan of the chest, abdomen pelvis after cycle #4. The patient was advised to call immediately if he has any other concerning symptoms in the interval. The patient voices understanding of current disease status and treatment options and is in agreement with the current care plan.  All questions were answered. The patient knows to call the clinic with any problems, questions or concerns. We can certainly see the patient much sooner if necessary.  Disclaimer: This note was dictated with voice recognition software. Similar sounding words can inadvertently be transcribed and may not be corrected upon review.

## 2020-10-28 ENCOUNTER — Other Ambulatory Visit: Payer: BC Managed Care – PPO

## 2020-10-28 ENCOUNTER — Inpatient Hospital Stay: Payer: BC Managed Care – PPO

## 2020-10-28 ENCOUNTER — Encounter: Payer: Self-pay | Admitting: Internal Medicine

## 2020-10-28 DIAGNOSIS — C3491 Malignant neoplasm of unspecified part of right bronchus or lung: Secondary | ICD-10-CM

## 2020-10-28 DIAGNOSIS — Z95828 Presence of other vascular implants and grafts: Secondary | ICD-10-CM

## 2020-10-28 DIAGNOSIS — C3482 Malignant neoplasm of overlapping sites of left bronchus and lung: Secondary | ICD-10-CM | POA: Diagnosis not present

## 2020-10-28 LAB — CBC WITH DIFFERENTIAL (CANCER CENTER ONLY)
Abs Immature Granulocytes: 0.01 10*3/uL (ref 0.00–0.07)
Basophils Absolute: 0 10*3/uL (ref 0.0–0.1)
Basophils Relative: 0 %
Eosinophils Absolute: 0 10*3/uL (ref 0.0–0.5)
Eosinophils Relative: 0 %
HCT: 24.2 % — ABNORMAL LOW (ref 39.0–52.0)
Hemoglobin: 8.5 g/dL — ABNORMAL LOW (ref 13.0–17.0)
Immature Granulocytes: 0 %
Lymphocytes Relative: 10 %
Lymphs Abs: 0.3 10*3/uL — ABNORMAL LOW (ref 0.7–4.0)
MCH: 30.4 pg (ref 26.0–34.0)
MCHC: 35.1 g/dL (ref 30.0–36.0)
MCV: 86.4 fL (ref 80.0–100.0)
Monocytes Absolute: 0.4 10*3/uL (ref 0.1–1.0)
Monocytes Relative: 11 %
Neutro Abs: 2.5 10*3/uL (ref 1.7–7.7)
Neutrophils Relative %: 79 %
Platelet Count: 81 10*3/uL — ABNORMAL LOW (ref 150–400)
RBC: 2.8 MIL/uL — ABNORMAL LOW (ref 4.22–5.81)
RDW: 14.7 % (ref 11.5–15.5)
WBC Count: 3.2 10*3/uL — ABNORMAL LOW (ref 4.0–10.5)
nRBC: 0 % (ref 0.0–0.2)

## 2020-10-28 LAB — CMP (CANCER CENTER ONLY)
ALT: 31 U/L (ref 0–44)
AST: 30 U/L (ref 15–41)
Albumin: 3.5 g/dL (ref 3.5–5.0)
Alkaline Phosphatase: 80 U/L (ref 38–126)
Anion gap: 6 (ref 5–15)
BUN: 13 mg/dL (ref 6–20)
CO2: 27 mmol/L (ref 22–32)
Calcium: 9.1 mg/dL (ref 8.9–10.3)
Chloride: 107 mmol/L (ref 98–111)
Creatinine: 0.91 mg/dL (ref 0.61–1.24)
GFR, Estimated: >60 mL/min (ref >60)
Glucose, Bld: 112 mg/dL — ABNORMAL HIGH (ref 70–99)
Potassium: 4 mmol/L (ref 3.5–5.1)
Sodium: 140 mmol/L (ref 135–145)
Total Bilirubin: 0.6 mg/dL (ref 0.3–1.2)
Total Protein: 6.6 g/dL (ref 6.5–8.1)

## 2020-10-28 MED ORDER — SODIUM CHLORIDE 0.9% FLUSH
10.0000 mL | INTRAVENOUS | Status: DC | PRN
  Administered 2020-10-28: 10 mL via INTRAVENOUS
  Filled 2020-10-28: qty 10

## 2020-10-28 MED ORDER — HEPARIN SOD (PORK) LOCK FLUSH 100 UNIT/ML IV SOLN
500.0000 [IU] | Freq: Once | INTRAVENOUS | Status: AC
Start: 2020-10-28 — End: 2020-10-28
  Administered 2020-10-28: 500 [IU] via INTRAVENOUS
  Filled 2020-10-28: qty 5

## 2020-10-29 ENCOUNTER — Encounter: Payer: Self-pay | Admitting: Internal Medicine

## 2020-10-29 ENCOUNTER — Other Ambulatory Visit: Payer: Self-pay

## 2020-10-30 MED ORDER — PREDNISONE 10 MG PO TABS: 10.0000 mg | ORAL_TABLET | Freq: Every day | ORAL | 0 refills | Status: DC

## 2020-11-04 NOTE — Progress Notes (Deleted)
[facility] OFFICE PROGRESS NOTE  [name], DO [place]  [address] [place] [address]  DIAGNOSIS: Metastatic non-small cell lung cancer initially diagnosed as stage IIB (T3, N0, M0) non-small cell lung cancer, adenocarcinoma presented with large central perihilar mass with suspicious groundglass opacity in the right middle lobe and left upper lobe diagnosed in September 2021.  The patient had evidence of metastatic disease to the right kidney, right pleural space, liver, thoracic:/Abdominal lymph nodes, and bones.  There is also a probable left-sided pleural metastasis as well in March 2022/May 2022.    Molecular studies by guardant 360: No actionable mutations.   PDl1: 80%  PRIOR THERAPY: 1) Concurrent chemoradiation with weekly carboplatin for AUC of 2 and paclitaxel 45 mg/M2.  First dose February 10, 2020.  Status post 5  cycles. 2) Palliative radiation to the left anterior rib cage area as well as the mid back area under the care of Dr. Sondra Come. Last treatment on 10/05/20. 3) SRS to the metastatic brain lesion under the care of Dr. Isidore Moos. Completed on 10/13/20.   CURRENT THERAPY: 1) Palliative systemic chemotherapy with carboplatin AUC of 5, Alimta 500 mg per metered squared, Keytruda 200 mg IV every 3 weeks.  First dose expected on 09/24/2020. Status post 2 cycles.   INTERVAL HISTORY: Richard Li 60 y.o. male returns to the clinic today for a follow up visit accompanied by his wife. The patient is feeling fatigued.  The patient recently was found to have evidence of disease progression; therefore, the patient started palliative systemic chemotherapy. He had a restaging CT scan at the last appointment which showed a positive response to treatment thus far. Otherwise, the patient denies any recent fever or chills.  He has been having night sweats on and off.  He reports some dyspnea on exertion and a chronic cough but denies any hemoptysis. He was seen by  pulmonology last month who recommended _. He has a cough and takes Mucinex.  He has not tried taking Robitussin or Delsym at this time.  He reports a good appetite and his weight is stable. He denies any diarrhea.  He has some constipation likely secondary to his pain medication use.  He only uses a stool softener 1 time at nighttime. He denies any headache or visual changes.  He denies any rashes or skin changes. He has nausea without vomiting.  He takes Compazine for his nausea with some improvement in his nausea. The patient is here for evaluation before starting cycle #3.   MEDICAL HISTORY: Past Medical History:  Diagnosis Date   Allergic rhinitis, cause unspecified    Anxiety state, unspecified    Asthma    as a child   Chronic airway obstruction, not elsewhere classified    Esophageal reflux    History of kidney stones    History of radiation therapy 02/13/2020-03/24/2020   right lung        Dr Gery Pray   Hypertension    Lumbago    lung ca dx'd 12/2019   Other and unspecified hyperlipidemia    Other chest pain     ALLERGIES:  is allergic to amoxicillin.  MEDICATIONS:  Current Outpatient Medications  Medication Sig Dispense Refill   acetaminophen (TYLENOL) 500 MG tablet Take 1,000 mg by mouth 2 (two) times daily.     Brimonidine Tartrate (LUMIFY) 0.025 % SOLN Place 1 drop into both eyes 2 (two) times a week. As needed  dextromethorphan-guaiFENesin (MUCINEX DM) 30-600 MG 12hr tablet Take 1 tablet by mouth 2 (two) times daily.     doxycycline (VIBRA-TABS) 100 MG tablet Take 1 tablet by mouth twice daily 60 tablet 0   feeding supplement (ENSURE ENLIVE / ENSURE PLUS) LIQD Take 237 mLs by mouth 3 (three) times daily between meals. (Patient taking differently: Take 237 mLs by mouth 2 (two) times daily between meals.) 237 mL 12   fenofibrate 160 MG tablet Take 1 tablet by mouth once daily (Patient taking differently: Take 160 mg by mouth daily.) 90 tablet 3   ferrous sulfate 325  (65 FE) MG tablet Take 1 tablet (325 mg total) by mouth daily with breakfast. 30 tablet 0   folic acid (FOLVITE) 1 MG tablet Take 1 tablet (1 mg total) by mouth daily. 30 tablet 2   Ipratropium-Albuterol (COMBIVENT RESPIMAT) 20-100 MCG/ACT AERS respimat Inhale 1-2 puffs into the lungs every 6 (six) hours as needed for wheezing. 4 g 3   lidocaine-prilocaine (EMLA) cream Apply 1 application topically as needed. 30 g 2   LORazepam (ATIVAN) 1 MG tablet Take 1 tablet (1 mg total) by mouth See admin instructions. Take one tablet (1 mg) by mouth daily at bedtime, may also take one tablet (1 mg) twice during the day as needed for anxiety (Patient taking differently: Take 1 mg by mouth 3 (three) times daily. Take one tablet (1 mg) by mouth daily at bedtime, may also take one tablet (1 mg) twice during the day as needed for anxiety) 90 tablet 2   meclizine (ANTIVERT) 25 MG tablet Take 1 tablet (25 mg total) by mouth every 6 (six) hours as needed for dizziness. 60 tablet 0   Multiple Vitamin (MULTIVITAMIN WITH MINERALS) TABS tablet Take 1 tablet by mouth daily.     ondansetron (ZOFRAN) 8 MG tablet Take 1 tablet (8 mg total) by mouth every 8 (eight) hours as needed for nausea or vomiting. Starting day 3 after chemotherapy 30 tablet 2   oxyCODONE-acetaminophen (PERCOCET/ROXICET) 5-325 MG tablet Take 1 tablet by mouth every 6 (six) hours as needed for severe pain. 30 tablet 0   predniSONE (DELTASONE) 10 MG tablet Take 1 tablet (10 mg total) by mouth daily with breakfast. 90 tablet 0   prochlorperazine (COMPAZINE) 10 MG tablet Take 1 tablet (10 mg total) by mouth every 6 (six) hours as needed. 30 tablet 2   No current facility-administered medications for this visit.   Facility-Administered Medications Ordered in Other Visits  Medication Dose Route Frequency Provider Last Rate Last Admin   0.9 %  sodium chloride infusion   Intravenous Continuous Curt Bears, MD   Stopped at 03/05/20 1410    SURGICAL  HISTORY:  Past Surgical History:  Procedure Laterality Date   APPENDECTOMY     BRONCHIAL BIOPSY  01/07/2020   Procedure: BRONCHIAL BIOPSIES;  Surgeon: Collene Gobble, MD;  Location: [facility];  Service: Pulmonary;;   BRONCHIAL BIOPSY  04/05/2020   Procedure: BRONCHIAL BIOPSIES;  Surgeon: Rigoberto Noel, MD;  Location: [facility];  Service: Cardiopulmonary;;   BRONCHIAL BRUSHINGS  01/07/2020   Procedure: BRONCHIAL BRUSHINGS;  Surgeon: Collene Gobble, MD;  [facility];  Service: Pulmonary;;   BRONCHIAL NEEDLE ASPIRATION BIOPSY  01/07/2020   Procedure: BRONCHIAL NEEDLE ASPIRATION BIOPSIES;  Surgeon: Collene Gobble, MD;  Location: [facility];  Service: Pulmonary;;   BRONCHIAL WASHINGS  01/07/2020   Procedure: BRONCHIAL WASHINGS;  Surgeon: Collene Gobble, MD;  Location: [facility];  Service:  Pulmonary;;   BRONCHIAL WASHINGS  04/05/2020   Procedure: BRONCHIAL WASHINGS;  Surgeon: Rigoberto Noel, MD;  Location: [facility] ENDOSCOPY;  Service: Cardiopulmonary;;   COLONOSCOPY  15 years ago   HEMOSTASIS CONTROL  01/07/2020   Procedure: HEMOSTASIS CONTROL;  Surgeon: Collene Gobble, MD;  Location: [facility] ENDOSCOPY;  Service: Pulmonary;;  cold saline   IR IMAGING GUIDED PORT INSERTION  09/18/2020   NASAL TURBINATE REDUCTION  2002   Dr.Crossley   VASECTOMY     VIDEO BRONCHOSCOPY Right 04/05/2020   Procedure: VIDEO BRONCHOSCOPY WITH FLUORO;  Surgeon: Rigoberto Noel, MD;  Location: [facility];  Service: Cardiopulmonary;  Laterality: Right;   VIDEO BRONCHOSCOPY WITH ENDOBRONCHIAL NAVIGATION N/A 01/07/2020   Procedure: VIDEO BRONCHOSCOPY WITH ENDOBRONCHIAL NAVIGATION;  Surgeon: Collene Gobble, MD;  Location: [facility] ENDOSCOPY;  Service: Pulmonary;  Laterality: N/A;   VIDEO BRONCHOSCOPY WITH ENDOBRONCHIAL ULTRASOUND N/A 01/07/2020   Procedure: VIDEO BRONCHOSCOPY WITH ENDOBRONCHIAL ULTRASOUND;  Surgeon: Collene Gobble, MD;  Location: [facility] ENDOSCOPY;  Service: Pulmonary;  Laterality: N/A;    REVIEW OF  SYSTEMS:   Review of Systems  Constitutional: Negative for appetite change, chills, fatigue, fever and unexpected weight change.  HENT:   Negative for mouth sores, nosebleeds, sore throat and trouble swallowing.   Eyes: Negative for eye problems and icterus.  Respiratory: Negative for cough, hemoptysis, shortness of breath and wheezing.   Cardiovascular: Negative for chest pain and leg swelling.  Gastrointestinal: Negative for abdominal pain, constipation, diarrhea, nausea and vomiting.  Genitourinary: Negative for bladder incontinence, difficulty urinating, dysuria, frequency and hematuria.   Musculoskeletal: Negative for back pain, gait problem, neck pain and neck stiffness.  Skin: Negative for itching and rash.  Neurological: Negative for dizziness, extremity weakness, gait problem, headaches, light-headedness and seizures.  Hematological: Negative for adenopathy. Does not bruise/bleed easily.  Psychiatric/Behavioral: Negative for confusion, depression and sleep disturbance. The patient is not nervous/anxious.     PHYSICAL EXAMINATION:  There were no vitals taken for this visit.  ECOG PERFORMANCE STATUS: {CHL ONC ECOG [id number]  Physical Exam  Constitutional: Oriented to person, place, and time and well-developed, well-nourished, and in no distress. No distress.  HENT:  Head: Normocephalic and atraumatic.  Mouth/Throat: Oropharynx is clear and moist. No oropharyngeal exudate.  Eyes: Conjunctivae are normal. Right eye exhibits no discharge. Left eye exhibits no discharge. No scleral icterus.  Neck: Normal range of motion. Neck supple.  Cardiovascular: Normal rate, regular rhythm, normal heart sounds and intact distal pulses.   Pulmonary/Chest: Effort normal and breath sounds normal. No respiratory distress. No wheezes. No rales.  Abdominal: Soft. Bowel sounds are normal. Exhibits no distension and no mass. There is no tenderness.  Musculoskeletal: Normal range of motion.  Exhibits no edema.  Lymphadenopathy:    No cervical adenopathy.  Neurological: Alert and oriented to person, place, and time. Exhibits normal muscle tone. Gait normal. Coordination normal.  Skin: Skin is warm and dry. No rash noted. Not diaphoretic. No erythema. No pallor.  Psychiatric: Mood, memory and judgment normal.  Vitals reviewed.  LABORATORY DATA: Lab Results  Component Value Date   WBC 3.2 (L) 10/28/2020   HGB 8.5 (L) 10/28/2020   HCT 24.2 (L) 10/28/2020   MCV 86.4 10/28/2020   PLT 81 (L) 10/28/2020      Chemistry      Component Value Date/Time   NA 140 10/28/2020 1426   K 4.0 10/28/2020 1426   CL 107 10/28/2020 1426   CO2 27 10/28/2020 1426   BUN  13 10/28/2020 1426   CREATININE 0.91 10/28/2020 1426   CREATININE 0.67 (L) 06/24/2020 1517      Component Value Date/Time   CALCIUM 9.1 10/28/2020 1426   ALKPHOS 80 10/28/2020 1426   AST 30 10/28/2020 1426   ALT 31 10/28/2020 1426   BILITOT 0.6 10/28/2020 1426       RADIOGRAPHIC STUDIES:  CT Chest W Contrast  Result Date: 10/22/2020 CLINICAL DATA:  Primary Cancer Type: Lung Imaging Indication: Assess response to therapy Interval therapy since last imaging? Yes Initial Cancer Diagnosis Date: 01/07/2020; Established by: Biopsy-proven Detailed Pathology: Stage IIB non-small cell lung cancer, adenocarcinoma. Primary Tumor location: Perihilar right lung mass. Osseous, brain, and right kidney metastases. Surgeries: Appendectomy. Chemotherapy: Yes; Ongoing? Yes; Most recent administration: 10/15/2020 Immunotherapy?  Yes; Type: Keytruda; Ongoing? Yes Radiation therapy? Yes Date Range: 10/13/2020; Target: Brain Date Range: 09/22/2020 - 10/05/2020; Target: Left chest and spine Date Range: 02/12/2020 - 03/24/2020; Target: Right lung EXAM: CT CHEST WITH CONTRAST TECHNIQUE: Multidetector CT imaging of the chest was performed during intravenous contrast administration. CONTRAST:  78m OMNIPAQUE IOHEXOL 300 MG/ML  SOLN COMPARISON:  Most  recent CT chest 07/20/2020.  09/10/2020 PET-CT. FINDINGS: Cardiovascular: Port in the anterior chest wall with tip in distal SVC. No acute findings of the aorta great vessels. Truncation of the LEFT upper lobe pulmonary artery related to prior surgery. Mediastinum/Nodes: No axillary or supraclavicular adenopathy. No mediastinal or hilar adenopathy. No pericardial fluid. Esophagus normal. Small hypermetabolic pleural metastasis seen on comparison PET-CT scan are less conspicuous Consolidation with cavitation in the superior RIGHT upper lobe unchanged. No new nodularity. Lungs/Pleura: Upper Abdomen: Hepatic metastasis measuring 11 mm (image 141/2) appears decreased from 16 mm. No new lesions in liver. Infiltrative mass in the upper pole of the RIGHT kidney is again demonstrated concerning for metastasis. Adrenal glands normal. Musculoskeletal: New superior endplate compression fracture T12 site of prior hypermetabolic skeletal metastasis. No new skeletal lesions are identified however lesions were relatively occult by CT imaging on comparison FDG PET scan. IMPRESSION: 1. No evidence of metastatic lung cancer progression. 2. Stable consolidation and cavitation in the superior aspect of the RIGHT upper lobe. 3. Previously hypermetabolic small hypermetabolic pleural nodules are less conspicuous. 4. LEFT hepatic lobe metastatic lesion appears slightly decreased in size. 5. RIGHT renal metastasis is incompletely imaged. 6. No clear progression of skeletal metastasis however lesions were relatively occult on the CT portion of prior FDG PET scan. New superior endplate compression deformity at metastatic lesion at T12. Electronically Signed   By: SSuzy BouchardM.D.   On: 10/22/2020 10:21     ASSESSMENT/PLAN:  This is a very pleasant 60year old Caucasian male with metastatic non-small cell lung cancer.  He was initially diagnosed as a stage IIb (T3, N0, M0) non-small cell lung cancer, adenocarcinoma.  He originally  presented with a perihilar right upper lobe lung mass and suspicious groundglass opacity in the right middle lobe.  He had evidence of metastatic disease in March/ May 2022 with a biopsy-proven metastatic lesion in the right kidney as well as right pleural space, liver, thoracic/abdominal lymph nodes, and bones.  There is also a probable left-sided pleural metastasis.  The patient's PD-L1 expression is 80%.  He is negative for any actionable mutations by guardant 360.   The patient was not a good candidate for resection according to Dr. HRoxan Hockey  Therefore, the patient underwent a course of concurrent chemoradiation with weekly carboplatin for an AUC of 2 and paclitaxel 45 mg per metered squared  status post 5 cycles.  He experienced some fatigue, mild odynophagia as well as recurrent pneumonia and septicemia.  He also had radiation-induced pneumonitis.  He was admitted to the hospital several times with recurrent pneumonia.   In March 2022, the patient had a biopsy confirmed metastatic lesion to the kidney.    The patient is currently undergoing palliative systemic chemotherapy with carboplatin for an AUC of 5, Alimta 500 mg per metered squared, Keytruda 20 mg IV every 3 weeks.  He is status post 2 cycle and tolerated it fair except for fatigue and nausea without vomiting   He completed SRS to a small metastatic brain lesion. He also completed palliative radiation to the painful ribs and mid thoracic metastatic bone lesions under the care of Dr. Sondra Come in June.   The patient was seen with Dr. Julien Nordmann. Labs were reviewed. Recommend that he  _ with cycle #3 today as scheduled.   We will see him back for a follow up visit in 3 weeks for evaluation before starting cycle #4.   The patient was advised to call immediately if he has any concerning symptoms in the interval. The patient voices understanding of current disease status and treatment options and is in agreement with the current care  plan. All questions were answered. The patient knows to call the clinic with any problems, questions or concerns. We can certainly see the patient much sooner if necessary         No orders of the defined types were placed in this encounter.    I spent {CHL ONC TIME VISIT - EKBTC:[id number]} counseling the patient face to face. The total time spent in the appointment was {CHL ONC TIME VISIT - PETKK:[id number]}.  Dhani Imel L Trinika Cortese, PA-C 11/04/20

## 2020-11-05 ENCOUNTER — Inpatient Hospital Stay: Payer: BC Managed Care – PPO

## 2020-11-05 ENCOUNTER — Inpatient Hospital Stay (HOSPITAL_BASED_OUTPATIENT_CLINIC_OR_DEPARTMENT_OTHER): Payer: BC Managed Care – PPO | Admitting: Internal Medicine

## 2020-11-05 ENCOUNTER — Other Ambulatory Visit: Payer: Self-pay | Admitting: Physician Assistant

## 2020-11-05 ENCOUNTER — Other Ambulatory Visit: Payer: Self-pay | Admitting: Family Medicine

## 2020-11-05 ENCOUNTER — Other Ambulatory Visit: Payer: Self-pay

## 2020-11-05 ENCOUNTER — Other Ambulatory Visit: Payer: BC Managed Care – PPO

## 2020-11-05 ENCOUNTER — Encounter: Payer: Self-pay | Admitting: Internal Medicine

## 2020-11-05 VITALS — HR 95

## 2020-11-05 VITALS — BP 134/68 | HR 104 | Temp 100.0°F | Resp 18 | Ht 70.0 in | Wt 192.5 lb

## 2020-11-05 DIAGNOSIS — Z5112 Encounter for antineoplastic immunotherapy: Secondary | ICD-10-CM

## 2020-11-05 DIAGNOSIS — Z5111 Encounter for antineoplastic chemotherapy: Secondary | ICD-10-CM | POA: Diagnosis not present

## 2020-11-05 DIAGNOSIS — C3491 Malignant neoplasm of unspecified part of right bronchus or lung: Secondary | ICD-10-CM

## 2020-11-05 DIAGNOSIS — F411 Generalized anxiety disorder: Secondary | ICD-10-CM

## 2020-11-05 DIAGNOSIS — C3482 Malignant neoplasm of overlapping sites of left bronchus and lung: Secondary | ICD-10-CM | POA: Diagnosis not present

## 2020-11-05 DIAGNOSIS — Z95828 Presence of other vascular implants and grafts: Secondary | ICD-10-CM

## 2020-11-05 LAB — CMP (CANCER CENTER ONLY)
ALT: 13 U/L (ref 0–44)
AST: 19 U/L (ref 15–41)
Albumin: 3.6 g/dL (ref 3.5–5.0)
Alkaline Phosphatase: 74 U/L (ref 38–126)
Anion gap: 8 (ref 5–15)
BUN: 14 mg/dL (ref 6–20)
CO2: 26 mmol/L (ref 22–32)
Calcium: 9.2 mg/dL (ref 8.9–10.3)
Chloride: 106 mmol/L (ref 98–111)
Creatinine: 0.95 mg/dL (ref 0.61–1.24)
GFR, Estimated: >60 mL/min (ref >60)
Glucose, Bld: 101 mg/dL — ABNORMAL HIGH (ref 70–99)
Potassium: 3.1 mmol/L — ABNORMAL LOW (ref 3.5–5.1)
Sodium: 140 mmol/L (ref 135–145)
Total Bilirubin: 0.5 mg/dL (ref 0.3–1.2)
Total Protein: 6.8 g/dL (ref 6.5–8.1)

## 2020-11-05 LAB — CBC WITH DIFFERENTIAL (CANCER CENTER ONLY)
Abs Immature Granulocytes: 0.01 10*3/uL (ref 0.00–0.07)
Basophils Absolute: 0 10*3/uL (ref 0.0–0.1)
Basophils Relative: 0 %
Eosinophils Absolute: 0.1 10*3/uL (ref 0.0–0.5)
Eosinophils Relative: 2 %
HCT: 26.5 % — ABNORMAL LOW (ref 39.0–52.0)
Hemoglobin: 9.2 g/dL — ABNORMAL LOW (ref 13.0–17.0)
Immature Granulocytes: 0 %
Lymphocytes Relative: 12 %
Lymphs Abs: 0.4 10*3/uL — ABNORMAL LOW (ref 0.7–4.0)
MCH: 31.3 pg (ref 26.0–34.0)
MCHC: 34.7 g/dL (ref 30.0–36.0)
MCV: 90.1 fL (ref 80.0–100.0)
Monocytes Absolute: 0.7 10*3/uL (ref 0.1–1.0)
Monocytes Relative: 20 %
Neutro Abs: 2.1 10*3/uL (ref 1.7–7.7)
Neutrophils Relative %: 66 %
Platelet Count: 146 10*3/uL — ABNORMAL LOW (ref 150–400)
RBC: 2.94 MIL/uL — ABNORMAL LOW (ref 4.22–5.81)
RDW: 19.9 % — ABNORMAL HIGH (ref 11.5–15.5)
WBC Count: 3.2 10*3/uL — ABNORMAL LOW (ref 4.0–10.5)
nRBC: 0 % (ref 0.0–0.2)

## 2020-11-05 LAB — TSH: TSH: 1.549 u[IU]/mL (ref 0.320–4.118)

## 2020-11-05 MED ORDER — CYANOCOBALAMIN 1000 MCG/ML IJ SOLN
INTRAMUSCULAR | Status: AC
  Filled 2020-11-05: qty 1

## 2020-11-05 MED ORDER — SODIUM CHLORIDE 0.9% FLUSH
10.0000 mL | INTRAVENOUS | Status: DC | PRN
  Administered 2020-11-05: 10 mL
  Filled 2020-11-05: qty 10

## 2020-11-05 MED ORDER — SODIUM CHLORIDE 0.9 % IV SOLN
500.0000 mg/m2 | Freq: Once | INTRAVENOUS | Status: AC
  Administered 2020-11-05: 1000 mg via INTRAVENOUS
  Filled 2020-11-05: qty 40

## 2020-11-05 MED ORDER — SODIUM CHLORIDE 0.9 % IV SOLN
200.0000 mg | Freq: Once | INTRAVENOUS | Status: AC
  Administered 2020-11-05: 200 mg via INTRAVENOUS
  Filled 2020-11-05: qty 8

## 2020-11-05 MED ORDER — HEPARIN SOD (PORK) LOCK FLUSH 100 UNIT/ML IV SOLN
500.0000 [IU] | Freq: Once | INTRAVENOUS | Status: AC | PRN
  Administered 2020-11-05: 500 [IU]
  Filled 2020-11-05: qty 5

## 2020-11-05 MED ORDER — PALONOSETRON HCL INJECTION 0.25 MG/5ML
INTRAVENOUS | Status: AC
  Filled 2020-11-05: qty 5

## 2020-11-05 MED ORDER — SODIUM CHLORIDE 0.9% FLUSH
10.0000 mL | INTRAVENOUS | Status: AC | PRN
Start: 2020-11-05 — End: 2020-11-05
  Administered 2020-11-05: 10 mL
  Filled 2020-11-05: qty 10

## 2020-11-05 MED ORDER — SODIUM CHLORIDE 0.9 % IV SOLN
10.0000 mg | Freq: Once | INTRAVENOUS | Status: AC
  Administered 2020-11-05: 10 mg via INTRAVENOUS
  Filled 2020-11-05: qty 10

## 2020-11-05 MED ORDER — PALONOSETRON HCL INJECTION 0.25 MG/5ML
0.2500 mg | Freq: Once | INTRAVENOUS | Status: AC
  Administered 2020-11-05: 0.25 mg via INTRAVENOUS

## 2020-11-05 MED ORDER — SODIUM CHLORIDE 0.9 % IV SOLN
627.0000 mg | Freq: Once | INTRAVENOUS | Status: AC
  Administered 2020-11-05: 630 mg via INTRAVENOUS
  Filled 2020-11-05: qty 63

## 2020-11-05 MED ORDER — CYANOCOBALAMIN 1000 MCG/ML IJ SOLN
1000.0000 ug | Freq: Once | INTRAMUSCULAR | Status: AC
  Administered 2020-11-05: 1000 ug via INTRAMUSCULAR

## 2020-11-05 MED ORDER — SODIUM CHLORIDE 0.9 % IV SOLN
150.0000 mg | Freq: Once | INTRAVENOUS | Status: AC
  Administered 2020-11-05: 150 mg via INTRAVENOUS
  Filled 2020-11-05: qty 150

## 2020-11-05 MED ORDER — POTASSIUM CHLORIDE CRYS ER 20 MEQ PO TBCR: 20.0000 meq | EXTENDED_RELEASE_TABLET | Freq: Every day | ORAL | 0 refills | Status: DC

## 2020-11-05 MED ORDER — SODIUM CHLORIDE 0.9 % IV SOLN
Freq: Once | INTRAVENOUS | Status: AC
  Filled 2020-11-05: qty 250

## 2020-11-05 MED ORDER — OXYCODONE-ACETAMINOPHEN 5-325 MG PO TABS: 1.0000 | ORAL_TABLET | Freq: Four times a day (QID) | ORAL | 0 refills | Status: DC | PRN

## 2020-11-05 NOTE — Patient Instructions (Signed)
[facility]COL[facility]  Discharge Instructions: Thank you for choosing [facility] to provide your oncology and hematology care.   If you have a lab appointment with the [facility], please go directly to the [facility] and check in at the registration area.   Wear comfortable clothing and clothing appropriate for easy access to any Portacath or PICC line.   We strive to give you quality time with your provider. You may need to reschedule your appointment if you arrive late (15 or more minutes).  Arriving late affects you and other patients whose appointments are after yours.  Also, if you miss three or more appointments without notifying the office, you may be dismissed from the clinic at the provider's discretion.      For prescription refill requests, have your pharmacy contact our office and allow 72 hours for refills to be completed.    Today you received the following chemotherapy and/or immunotherapy agents; Keytruda, Alimta, carboplatin     To help prevent nausea and vomiting after your treatment, we encourage you to take your nausea medication as directed.  BELOW ARE SYMPTOMS THAT SHOULD BE REPORTED IMMEDIATELY: *FEVER GREATER THAN 100.4 F (38 C) OR HIGHER *CHILLS OR SWEATING *NAUSEA AND VOMITING THAT IS NOT CONTROLLED WITH YOUR NAUSEA MEDICATION *UNUSUAL SHORTNESS OF BREATH *UNUSUAL BRUISING OR BLEEDING *URINARY PROBLEMS (pain or burning when urinating, or frequent urination) *BOWEL PROBLEMS (unusual diarrhea, constipation, pain near the anus) TENDERNESS IN MOUTH AND THROAT WITH OR WITHOUT PRESENCE OF ULCERS (sore throat, sores in mouth, or a toothache) UNUSUAL RASH, SWELLING OR PAIN  UNUSUAL VAGINAL DISCHARGE OR ITCHING   Items with * indicate a potential emergency and should be followed up as soon as possible or go to the Emergency Department if any problems should occur.  Please show the CHEMOTHERAPY ALERT CARD or IMMUNOTHERAPY ALERT  CARD at check-in to the Emergency Department and triage nurse.  Should you have questions after your visit or need to cancel or reschedule your appointment, please contact [facility]  Dept: [phone]  and follow the prompts.  Office hours are 8:00 a.m. to 4:30 p.m. Monday - Friday. Please note that voicemails left after 4:00 p.m. may not be returned until the following business day.  We are closed weekends and major holidays. You have access to a nurse at all times for urgent questions. Please call the main number to the clinic Dept: [phone] and follow the prompts.   For any non-urgent questions, you may also contact your provider using MyChart. We now offer e-Visits for anyone 43 and older to request care online for non-urgent symptoms. For details visit [email or web address].   Also download the MyChart app! Go to the app store, search "MyChart", open the app, select [facility], and log in with your MyChart username and password.  Due to Covid, a mask is required upon entering the hospital/clinic. If you do not have a mask, one will be given to you upon arrival. For doctor visits, patients may have 1 support person aged 9 or older with them. For treatment visits, patients cannot have anyone with them due to current Covid guidelines and our immunocompromised population.

## 2020-11-05 NOTE — Progress Notes (Signed)
[facility] [phone]   [phone]  OFFICE PROGRESS NOTE  [name], DO [place]  [address] [place] [address]  DIAGNOSIS: Metastatic non-small cell lung cancer initially diagnosed as stage IIB (T3, N0, M0) non-small cell lung cancer, adenocarcinoma presented with large central perihilar mass with suspicious groundglass opacity in the right middle lobe and left upper lobe diagnosed in September 2021.  The patient had evidence of metastatic disease to the right kidney in March 2022.  Molecular studies by guardant 360: No actionable mutations.   PDl1: 80%   PRIOR THERAPY: Concurrent chemoradiation with weekly carboplatin for AUC of 2 and paclitaxel 45 mg/M2.  First dose February 10, 2020.  Status post 5  cycles.   CURRENT THERAPY:  1) Palliative systemic chemotherapy with carboplatin AUC of 5, Alimta 500 mg per metered squared, Keytruda 200 mg IV every 3 weeks.  First dose expected on 09/24/2020. Status post 2 cycles.  2) Palliative radiation to the left anterior rib cage area as well as the mid back area under the care of Dr. Sondra Come. Last treatment on 10/05/20.  INTERVAL HISTORY: Richard Li 60 y.o. male returns to the clinic today for follow-up visit.  The patient is feeling fine today with no concerning complaints except for occasional pain on the left side of the chest which has significantly improved after starting systemic chemotherapy.  He used Tylenol and oxycodone on as-needed basis.  He denied having any current shortness of breath, cough or hemoptysis.  He denied having any nausea, vomiting, diarrhea or constipation.  He has no headache or visual changes.  He tolerated the last cycle of his treatment well except for fatigue.  He is here today for evaluation before starting cycle #3 of his chemotherapy.   MEDICAL HISTORY: Past Medical History:  Diagnosis Date   Allergic rhinitis, cause unspecified    Anxiety state,  unspecified    Asthma    as a child   Chronic airway obstruction, not elsewhere classified    Esophageal reflux    History of kidney stones    History of radiation therapy 02/13/2020-03/24/2020   right lung        Dr Gery Pray   Hypertension    Lumbago    lung ca dx'd 12/2019   Other and unspecified hyperlipidemia    Other chest pain     ALLERGIES:  is allergic to amoxicillin.  MEDICATIONS:  Current Outpatient Medications  Medication Sig Dispense Refill   acetaminophen (TYLENOL) 500 MG tablet Take 1,000 mg by mouth 2 (two) times daily.     Brimonidine Tartrate (LUMIFY) 0.025 % SOLN Place 1 drop into both eyes 2 (two) times a week. As needed     dextromethorphan-guaiFENesin (MUCINEX DM) 30-600 MG 12hr tablet Take 1 tablet by mouth 2 (two) times daily.     doxycycline (VIBRA-TABS) 100 MG tablet Take 1 tablet by mouth twice daily 60 tablet 0   feeding supplement (ENSURE ENLIVE / ENSURE PLUS) LIQD Take 237 mLs by mouth 3 (three) times daily between meals. (Patient taking differently: Take 237 mLs by mouth 2 (two) times daily between meals.) 237 mL 12   fenofibrate 160 MG tablet Take 1 tablet by mouth once daily (Patient taking differently: Take 160 mg by mouth daily.) 90 tablet 3   ferrous sulfate 325 (65 FE) MG tablet Take 1 tablet (325 mg total) by mouth daily with breakfast. 30 tablet 0   folic  acid (FOLVITE) 1 MG tablet Take 1 tablet (1 mg total) by mouth daily. 30 tablet 2   Ipratropium-Albuterol (COMBIVENT RESPIMAT) 20-100 MCG/ACT AERS respimat Inhale 1-2 puffs into the lungs every 6 (six) hours as needed for wheezing. 4 g 3   lidocaine-prilocaine (EMLA) cream Apply 1 application topically as needed. 30 g 2   LORazepam (ATIVAN) 1 MG tablet Take 1 tablet (1 mg total) by mouth See admin instructions. Take one tablet (1 mg) by mouth daily at bedtime, may also take one tablet (1 mg) twice during the day as needed for anxiety (Patient taking differently: Take 1 mg by mouth 3 (three)  times daily. Take one tablet (1 mg) by mouth daily at bedtime, may also take one tablet (1 mg) twice during the day as needed for anxiety) 90 tablet 2   meclizine (ANTIVERT) 25 MG tablet Take 1 tablet (25 mg total) by mouth every 6 (six) hours as needed for dizziness. 60 tablet 0   Multiple Vitamin (MULTIVITAMIN WITH MINERALS) TABS tablet Take 1 tablet by mouth daily.     ondansetron (ZOFRAN) 8 MG tablet Take 1 tablet (8 mg total) by mouth every 8 (eight) hours as needed for nausea or vomiting. Starting day 3 after chemotherapy 30 tablet 2   oxyCODONE-acetaminophen (PERCOCET/ROXICET) 5-325 MG tablet Take 1 tablet by mouth every 6 (six) hours as needed for severe pain. 30 tablet 0   predniSONE (DELTASONE) 10 MG tablet Take 1 tablet (10 mg total) by mouth daily with breakfast. 90 tablet 0   prochlorperazine (COMPAZINE) 10 MG tablet Take 1 tablet (10 mg total) by mouth every 6 (six) hours as needed. 30 tablet 2   No current facility-administered medications for this visit.   Facility-Administered Medications Ordered in Other Visits  Medication Dose Route Frequency Provider Last Rate Last Admin   0.9 %  sodium chloride infusion   Intravenous Continuous Curt Bears, MD   Stopped at 03/05/20 1410    SURGICAL HISTORY:  Past Surgical History:  Procedure Laterality Date   APPENDECTOMY     BRONCHIAL BIOPSY  01/07/2020   Procedure: BRONCHIAL BIOPSIES;  Surgeon: Collene Gobble, MD;  Location: [facility] ENDOSCOPY;  Service: Pulmonary;;   BRONCHIAL BIOPSY  04/05/2020   Procedure: BRONCHIAL BIOPSIES;  Surgeon: Rigoberto Noel, MD;  Location: [facility];  Service: Cardiopulmonary;;   BRONCHIAL BRUSHINGS  01/07/2020   Procedure: BRONCHIAL BRUSHINGS;  Surgeon: Collene Gobble, MD;  Location: [facility] ENDOSCOPY;  Service: Pulmonary;;   BRONCHIAL NEEDLE ASPIRATION BIOPSY  01/07/2020   Procedure: BRONCHIAL NEEDLE ASPIRATION BIOPSIES;  Surgeon: Collene Gobble, MD;  Location: [facility] ENDOSCOPY;  Service: Pulmonary;;    BRONCHIAL WASHINGS  01/07/2020   Procedure: BRONCHIAL WASHINGS;  Surgeon: Collene Gobble, MD;  Location: [facility] ENDOSCOPY;  Service: Pulmonary;;   BRONCHIAL WASHINGS  04/05/2020   Procedure: BRONCHIAL WASHINGS;  Surgeon: Rigoberto Noel, MD;  Location: [facility];  Service: Cardiopulmonary;;   COLONOSCOPY  15 years ago   HEMOSTASIS CONTROL  01/07/2020   Procedure: HEMOSTASIS CONTROL;  Surgeon: Collene Gobble, MD;  Location: [facility] ENDOSCOPY;  Service: Pulmonary;;  cold saline   IR IMAGING GUIDED PORT INSERTION  09/18/2020   NASAL TURBINATE REDUCTION  2002   Dr.Crossley   VASECTOMY     VIDEO BRONCHOSCOPY Right 04/05/2020   Procedure: VIDEO BRONCHOSCOPY WITH FLUORO;  Surgeon: Rigoberto Noel, MD;  Location: [facility];  Service: Cardiopulmonary;  Laterality: Right;   VIDEO BRONCHOSCOPY WITH ENDOBRONCHIAL NAVIGATION N/A 01/07/2020   Procedure:  VIDEO BRONCHOSCOPY WITH ENDOBRONCHIAL NAVIGATION;  Surgeon: Collene Gobble, MD;  Location: [facility] ENDOSCOPY;  Service: Pulmonary;  Laterality: N/A;   VIDEO BRONCHOSCOPY WITH ENDOBRONCHIAL ULTRASOUND N/A 01/07/2020   Procedure: VIDEO BRONCHOSCOPY WITH ENDOBRONCHIAL ULTRASOUND;  Surgeon: Collene Gobble, MD;  Location: [facility] ENDOSCOPY;  Service: Pulmonary;  Laterality: N/A;    REVIEW OF SYSTEMS:  A comprehensive review of systems was negative except for: Constitutional: positive for fatigue Respiratory: positive for pleurisy/chest pain   PHYSICAL EXAMINATION: General appearance: alert, cooperative, fatigued, and no distress Head: Normocephalic, without obvious abnormality, atraumatic Neck: no adenopathy, no JVD, supple, symmetrical, trachea midline, and thyroid not enlarged, symmetric, no tenderness/mass/nodules Lymph nodes: Cervical, supraclavicular, and axillary nodes normal. Resp: clear to auscultation bilaterally Back: symmetric, no curvature. ROM normal. No CVA tenderness. Cardio: regular rate and rhythm, S1, S2 normal, no murmur, click, rub or gallop GI: soft,  non-tender; bowel sounds normal; no masses,  no organomegaly Extremities: extremities normal, atraumatic, no cyanosis or edema  ECOG PERFORMANCE STATUS: 1 - Symptomatic but completely ambulatory  Blood pressure 134/68, pulse (!) 104, temperature 100 F (37.8 C), temperature source Oral, resp. rate 18, height 5' 10"  (1.778 m), weight 192 lb 8 oz (87.3 kg), SpO2 100 %.  LABORATORY DATA: Lab Results  Component Value Date   WBC 3.2 (L) 11/05/2020   HGB 9.2 (L) 11/05/2020   HCT 26.5 (L) 11/05/2020   MCV 90.1 11/05/2020   PLT 146 (L) 11/05/2020      Chemistry      Component Value Date/Time   NA 140 10/28/2020 1426   K 4.0 10/28/2020 1426   CL 107 10/28/2020 1426   CO2 27 10/28/2020 1426   BUN 13 10/28/2020 1426   CREATININE 0.91 10/28/2020 1426   CREATININE 0.67 (L) 06/24/2020 1517      Component Value Date/Time   CALCIUM 9.1 10/28/2020 1426   ALKPHOS 80 10/28/2020 1426   AST 30 10/28/2020 1426   ALT 31 10/28/2020 1426   BILITOT 0.6 10/28/2020 1426       RADIOGRAPHIC STUDIES: CT Chest W Contrast  Result Date: 10/22/2020 CLINICAL DATA:  Primary Cancer Type: Lung Imaging Indication: Assess response to therapy Interval therapy since last imaging? Yes Initial Cancer Diagnosis Date: 01/07/2020; Established by: Biopsy-proven Detailed Pathology: Stage IIB non-small cell lung cancer, adenocarcinoma. Primary Tumor location: Perihilar right lung mass. Osseous, brain, and right kidney metastases. Surgeries: Appendectomy. Chemotherapy: Yes; Ongoing? Yes; Most recent administration: 10/15/2020 Immunotherapy?  Yes; Type: Keytruda; Ongoing? Yes Radiation therapy? Yes Date Range: 10/13/2020; Target: Brain Date Range: 09/22/2020 - 10/05/2020; Target: Left chest and spine Date Range: 02/12/2020 - 03/24/2020; Target: Right lung EXAM: CT CHEST WITH CONTRAST TECHNIQUE: Multidetector CT imaging of the chest was performed during intravenous contrast administration. CONTRAST:  30m OMNIPAQUE IOHEXOL 300  MG/ML  SOLN COMPARISON:  Most recent CT chest 07/20/2020.  09/10/2020 PET-CT. FINDINGS: Cardiovascular: Port in the anterior chest wall with tip in distal SVC. No acute findings of the aorta great vessels. Truncation of the LEFT upper lobe pulmonary artery related to prior surgery. Mediastinum/Nodes: No axillary or supraclavicular adenopathy. No mediastinal or hilar adenopathy. No pericardial fluid. Esophagus normal. Small hypermetabolic pleural metastasis seen on comparison PET-CT scan are less conspicuous Consolidation with cavitation in the superior RIGHT upper lobe unchanged. No new nodularity. Lungs/Pleura: Upper Abdomen: Hepatic metastasis measuring 11 mm (image 141/2) appears decreased from 16 mm. No new lesions in liver. Infiltrative mass in the upper pole of the RIGHT kidney is again demonstrated concerning for  metastasis. Adrenal glands normal. Musculoskeletal: New superior endplate compression fracture T12 site of prior hypermetabolic skeletal metastasis. No new skeletal lesions are identified however lesions were relatively occult by CT imaging on comparison FDG PET scan. IMPRESSION: 1. No evidence of metastatic lung cancer progression. 2. Stable consolidation and cavitation in the superior aspect of the RIGHT upper lobe. 3. Previously hypermetabolic small hypermetabolic pleural nodules are less conspicuous. 4. LEFT hepatic lobe metastatic lesion appears slightly decreased in size. 5. RIGHT renal metastasis is incompletely imaged. 6. No clear progression of skeletal metastasis however lesions were relatively occult on the CT portion of prior FDG PET scan. New superior endplate compression deformity at metastatic lesion at T12. Electronically Signed   By: Suzy Bouchard M.D.   On: 10/22/2020 10:21     ASSESSMENT AND PLAN: This is a very pleasant 60 years old white male with metastatic non-small cell lung cancer that was initially diagnosed as stage IIb (T3, N0, M0) non-small cell lung cancer,  adenocarcinoma presented with perihilar right upper lobe lung mass with suspicious groundglass opacity in the right middle lobe.  He had evidence for disease metastasis in March 2022 to the right kidney that was biopsy-proven in April 2022. The patient is not a good surgical candidate for resection according to Dr. Roxan Hockey. The patient underwent a course of concurrent chemoradiation with weekly carboplatin for AUC of 2 and paclitaxel 45 mg/M2 status post 5 cycles.  He has been tolerating this treatment well except for fatigue, mild odynophagia as well as recurrent pneumonia and septicemia.  The patient also has radiation-induced pneumonitis. He was admitted to the hospital several times with recurrent pneumonia.   The patient had evidence for metastatic disease in April 2022 and he is currently undergoing systemic chemotherapy with carboplatin for AUC of 5, Alimta 500 Mg/M2 and Keytruda 200 Mg IV every 3 weeks status post 2 cycles. The patient has been tolerating this treatment well with no concerning adverse effect except for mild fatigue. I recommended for him to proceed with cycle #8 today as planned. I will see him back for follow-up visit in 3 weeks for evaluation before starting cycle #4. For pain management I will give him refill of oxycodone and he will continue to alternate with Tylenol on as-needed basis. The patient was advised to call immediately if he has any other concerning symptoms in the interval. The patient voices understanding of current disease status and treatment options and is in agreement with the current care plan.  All questions were answered. The patient knows to call the clinic with any problems, questions or concerns. We can certainly see the patient much sooner if necessary.  Disclaimer: This note was dictated with voice recognition software. Similar sounding words can inadvertently be transcribed and may not be corrected upon review.

## 2020-11-11 ENCOUNTER — Other Ambulatory Visit: Payer: BC Managed Care – PPO

## 2020-11-11 ENCOUNTER — Other Ambulatory Visit: Payer: Self-pay

## 2020-11-11 ENCOUNTER — Inpatient Hospital Stay: Payer: BC Managed Care – PPO

## 2020-11-11 ENCOUNTER — Other Ambulatory Visit: Payer: Self-pay | Admitting: Physician Assistant

## 2020-11-11 DIAGNOSIS — C3482 Malignant neoplasm of overlapping sites of left bronchus and lung: Secondary | ICD-10-CM | POA: Diagnosis not present

## 2020-11-11 DIAGNOSIS — D649 Anemia, unspecified: Secondary | ICD-10-CM

## 2020-11-11 DIAGNOSIS — C3491 Malignant neoplasm of unspecified part of right bronchus or lung: Secondary | ICD-10-CM

## 2020-11-11 DIAGNOSIS — E860 Dehydration: Secondary | ICD-10-CM

## 2020-11-11 LAB — CBC WITH DIFFERENTIAL (CANCER CENTER ONLY)
Abs Immature Granulocytes: 0.04 10*3/uL (ref 0.00–0.07)
Basophils Absolute: 0 10*3/uL (ref 0.0–0.1)
Basophils Relative: 0 %
Eosinophils Absolute: 0 10*3/uL (ref 0.0–0.5)
Eosinophils Relative: 0 %
HCT: 24.1 % — ABNORMAL LOW (ref 39.0–52.0)
Hemoglobin: 8.4 g/dL — ABNORMAL LOW (ref 13.0–17.0)
Immature Granulocytes: 1 %
Lymphocytes Relative: 10 %
Lymphs Abs: 0.3 10*3/uL — ABNORMAL LOW (ref 0.7–4.0)
MCH: 31.2 pg (ref 26.0–34.0)
MCHC: 34.9 g/dL (ref 30.0–36.0)
MCV: 89.6 fL (ref 80.0–100.0)
Monocytes Absolute: 0.3 10*3/uL (ref 0.1–1.0)
Monocytes Relative: 10 %
Neutro Abs: 2.4 10*3/uL (ref 1.7–7.7)
Neutrophils Relative %: 79 %
Platelet Count: 147 10*3/uL — ABNORMAL LOW (ref 150–400)
RBC: 2.69 MIL/uL — ABNORMAL LOW (ref 4.22–5.81)
RDW: 18.8 % — ABNORMAL HIGH (ref 11.5–15.5)
WBC Count: 3.1 10*3/uL — ABNORMAL LOW (ref 4.0–10.5)
nRBC: 0 % (ref 0.0–0.2)

## 2020-11-11 LAB — CMP (CANCER CENTER ONLY)
ALT: 20 U/L (ref 0–44)
AST: 26 U/L (ref 15–41)
Albumin: 4.1 g/dL (ref 3.5–5.0)
Alkaline Phosphatase: 78 U/L (ref 38–126)
Anion gap: 10 (ref 5–15)
BUN: 16 mg/dL (ref 6–20)
CO2: 28 mmol/L (ref 22–32)
Calcium: 9.3 mg/dL (ref 8.9–10.3)
Chloride: 101 mmol/L (ref 98–111)
Creatinine: 0.85 mg/dL (ref 0.61–1.24)
GFR, Estimated: >60 mL/min (ref >60)
Glucose, Bld: 110 mg/dL — ABNORMAL HIGH (ref 70–99)
Potassium: 4 mmol/L (ref 3.5–5.1)
Sodium: 139 mmol/L (ref 135–145)
Total Bilirubin: 1.1 mg/dL (ref 0.3–1.2)
Total Protein: 7.2 g/dL (ref 6.5–8.1)

## 2020-11-11 MED ORDER — HEPARIN SOD (PORK) LOCK FLUSH 100 UNIT/ML IV SOLN
500.0000 [IU] | Freq: Once | INTRAVENOUS | Status: AC | PRN
  Administered 2020-11-11: 500 [IU]
  Filled 2020-11-11: qty 5

## 2020-11-11 MED ORDER — SODIUM CHLORIDE 0.9% FLUSH
10.0000 mL | Freq: Once | INTRAVENOUS | Status: AC | PRN
  Administered 2020-11-11: 10 mL
  Filled 2020-11-11: qty 10

## 2020-11-13 ENCOUNTER — Encounter: Payer: Self-pay | Admitting: Radiation Oncology

## 2020-11-13 ENCOUNTER — Ambulatory Visit
Admission: RE | Admit: 2020-11-13 | Discharge: 2020-11-13 | Disposition: A | Discharge status: Home / Self Care | Payer: BC Managed Care – PPO | Source: Ambulatory Visit | Attending: Radiation Oncology | Admitting: Radiation Oncology

## 2020-11-13 ENCOUNTER — Other Ambulatory Visit: Payer: Self-pay

## 2020-11-13 VITALS — BP 126/65 | HR 93 | Temp 97.4°F | Resp 18 | Ht 70.0 in | Wt 192.1 lb

## 2020-11-13 DIAGNOSIS — C787 Secondary malignant neoplasm of liver and intrahepatic bile duct: Secondary | ICD-10-CM | POA: Diagnosis not present

## 2020-11-13 DIAGNOSIS — R51 Headache with orthostatic component, not elsewhere classified: Secondary | ICD-10-CM | POA: Insufficient documentation

## 2020-11-13 DIAGNOSIS — Z79899 Other long term (current) drug therapy: Secondary | ICD-10-CM | POA: Diagnosis not present

## 2020-11-13 DIAGNOSIS — C7931 Secondary malignant neoplasm of brain: Secondary | ICD-10-CM | POA: Diagnosis present

## 2020-11-13 DIAGNOSIS — C3411 Malignant neoplasm of upper lobe, right bronchus or lung: Secondary | ICD-10-CM | POA: Insufficient documentation

## 2020-11-13 DIAGNOSIS — C7951 Secondary malignant neoplasm of bone: Secondary | ICD-10-CM | POA: Diagnosis not present

## 2020-11-13 DIAGNOSIS — R569 Unspecified convulsions: Secondary | ICD-10-CM | POA: Insufficient documentation

## 2020-11-13 DIAGNOSIS — R42 Dizziness and giddiness: Secondary | ICD-10-CM | POA: Insufficient documentation

## 2020-11-13 DIAGNOSIS — R110 Nausea: Secondary | ICD-10-CM | POA: Insufficient documentation

## 2020-11-13 DIAGNOSIS — C7971 Secondary malignant neoplasm of right adrenal gland: Secondary | ICD-10-CM | POA: Insufficient documentation

## 2020-11-13 DIAGNOSIS — Z7951 Long term (current) use of inhaled steroids: Secondary | ICD-10-CM | POA: Insufficient documentation

## 2020-11-13 NOTE — Progress Notes (Signed)
Mr. Keltner presents today for follow-up after completing to brain [facility]) on 10/13/2020  Recent neurologic symptoms, if any:  Seizures: Patient denies Headaches: Reports mild headaches throughout the day. States they feel more intense/sharper first thing in the morning. States they're generally located on the top of head towards forehead. Nausea: States he feels most nauseous 2-3 days after a cycle of chemotherapy, but then continues to feel mildly nauseous for a couple more weeks. Reports appetite has decreased as a result Wt Readings from Last 3 Encounters:  11/13/20 192 lb 2 oz (87.1 kg)  11/05/20 192 lb 8 oz (87.3 kg)  10/27/20 192 lb 9.6 oz (87.4 kg)   Dizziness/ataxia: States very rarely he'll feel dizzy when he first stands up, but otherwise denies any issues Difficulty with hand coordination: Patient denies Focal numbness/weakness: Patient denies Visual deficits/changes: Patient denies Confusion/Memory deficits: Patient denies   Vitals:   11/13/20 1426  BP: 126/65  Pulse: 93  Resp: 18  Temp: (!) 97.4 F (36.3 C)  SpO2: 100%

## 2020-11-13 NOTE — Progress Notes (Signed)
Radiation Oncology         [phone] ________________________________  Name: Richard Li MRN: [id number]  Date: 11/13/2020  DOB: 08/25/1960  Follow-Up Visit Note  outpatient  CC: Richard Nutting, DO  Curt Bears, MD  Diagnosis and Prior Radiotherapy:    ICD-10-CM   1. Brain metastases ([place])  C79.31       CHIEF COMPLAINT: Here for follow-up and surveillance of brain cancer  Narrative:  The patient returns today for routine follow-up.  [name] presents today for follow-up after completing to brain [facility]) on 10/13/2020  Recent neurologic symptoms, if any:  Seizures: Patient denies Headaches: Reports mild headaches throughout the day. States they feel more intense/sharper first thing in the morning. States they're generally located on the top of head towards forehead. Nausea: States he feels most nauseous 2-3 days after a cycle of chemotherapy, but then continues to feel mildly nauseous for a couple more weeks. Reports appetite has decreased as a result Wt Readings from Last 3 Encounters:  11/13/20 192 lb 2 oz (87.1 kg)  11/05/20 192 lb 8 oz (87.3 kg)  10/27/20 192 lb 9.6 oz (87.4 kg)   Dizziness/ataxia: States very rarely he'll feel dizzy when he first stands up, but otherwise denies any issues Difficulty with hand coordination: Patient denies Focal numbness/weakness: Patient denies Visual deficits/changes: Patient denies Confusion/Memory deficits: Patient denies  He denies any significant bone pain. Vitals:   11/13/20 1426  BP: 126/65  Pulse: 93  Resp: 18  Temp: (!) 97.4 F (36.3 C)  SpO2: 100%                                 ALLERGIES:  is allergic to amoxicillin.  Meds: Current Outpatient Medications  Medication Sig Dispense Refill   acetaminophen (TYLENOL) 500 MG tablet Take 1,000 mg by mouth 2 (two) times daily.     Brimonidine Tartrate (LUMIFY) 0.025 % SOLN Place 1 drop into both eyes 2 (two) times a week. As needed      dextromethorphan-guaiFENesin (MUCINEX DM) 30-600 MG 12hr tablet Take 1 tablet by mouth 2 (two) times daily.     doxycycline (VIBRA-TABS) 100 MG tablet Take 1 tablet by mouth twice daily 60 tablet 0   feeding supplement (ENSURE ENLIVE / ENSURE PLUS) LIQD Take 237 mLs by mouth 3 (three) times daily between meals. (Patient taking differently: Take 237 mLs by mouth 2 (two) times daily between meals.) 237 mL 12   fenofibrate 160 MG tablet Take 1 tablet by mouth once daily (Patient taking differently: Take 160 mg by mouth daily.) 90 tablet 3   ferrous sulfate 325 (65 FE) MG tablet Take 1 tablet (325 mg total) by mouth daily with breakfast. 30 tablet 0   folic acid (FOLVITE) 1 MG tablet Take 1 tablet (1 mg total) by mouth daily. 30 tablet 2   Ipratropium-Albuterol (COMBIVENT RESPIMAT) 20-100 MCG/ACT AERS respimat Inhale 1-2 puffs into the lungs every 6 (six) hours as needed for wheezing. 4 g 3   lidocaine-prilocaine (EMLA) cream Apply 1 application topically as needed. 30 g 2   LORazepam (ATIVAN) 1 MG tablet TAKE 1 TABLET BY MOUTH AT BEDTIME(MAY ALSO TAKE 1 TABLET TWICE DURING THE DAY AS NEEDED FOR ANXIETY) 90 tablet 0   meclizine (ANTIVERT) 25 MG tablet Take 1 tablet (25 mg total) by mouth every 6 (six) hours as needed for dizziness. 60 tablet 0   Multiple Vitamin (MULTIVITAMIN WITH  MINERALS) TABS tablet Take 1 tablet by mouth daily.     ondansetron (ZOFRAN) 8 MG tablet Take 1 tablet (8 mg total) by mouth every 8 (eight) hours as needed for nausea or vomiting. Starting day 3 after chemotherapy 30 tablet 2   oxyCODONE-acetaminophen (PERCOCET/ROXICET) 5-325 MG tablet Take 1 tablet by mouth every 6 (six) hours as needed for severe pain. 30 tablet 0   potassium chloride SA (KLOR-CON) 20 MEQ tablet Take 1 tablet (20 mEq total) by mouth daily. 6 tablet 0   predniSONE (DELTASONE) 10 MG tablet Take 1 tablet (10 mg total) by mouth daily with breakfast. 90 tablet 0   prochlorperazine (COMPAZINE) 10 MG tablet Take 1  tablet (10 mg total) by mouth every 6 (six) hours as needed. 30 tablet 2   No current facility-administered medications for this encounter.   Facility-Administered Medications Ordered in Other Encounters  Medication Dose Route Frequency Provider Last Rate Last Admin   0.9 %  sodium chloride infusion   Intravenous Continuous Curt Bears, MD   Stopped at 03/05/20 1410    Physical Findings: The patient is in no acute distress. Patient is alert and oriented.  height is 5\' 10"  (1.778 m) and weight is 192 lb 2 oz (87.1 kg). His temporal temperature is 97.4 F (36.3 C) (abnormal). His blood pressure is 126/65 and his pulse is 93. His respiration is 18 and oxygen saturation is 100%. .    General he is in no acute distress, alert and oriented HEENT: Extraocular movements are intact.  No ptosis.  No oral thrush.  No facial droop Neuro: No gross focalities.  No tremor.  He is ambulatory.  Strength is symmetric. MSK: Excellent muscle tone.  Ambulatory  Lab Findings: Lab Results  Component Value Date   WBC 3.1 (L) 11/11/2020   HGB 8.4 (L) 11/11/2020   HCT 24.1 (L) 11/11/2020   MCV 89.6 11/11/2020   PLT 147 (L) 11/11/2020    Radiographic Findings: CT Chest W Contrast  Result Date: 10/22/2020 CLINICAL DATA:  Primary Cancer Type: Lung Imaging Indication: Assess response to therapy Interval therapy since last imaging? Yes Initial Cancer Diagnosis Date: 01/07/2020; Established by: Biopsy-proven Detailed Pathology: Stage IIB non-small cell lung cancer, adenocarcinoma. Primary Tumor location: Perihilar right lung mass. Osseous, brain, and right kidney metastases. Surgeries: Appendectomy. Chemotherapy: Yes; Ongoing? Yes; Most recent administration: 10/15/2020 Immunotherapy?  Yes; Type: Keytruda; Ongoing? Yes Radiation therapy? Yes Date Range: 10/13/2020; Target: Brain Date Range: 09/22/2020 - 10/05/2020; Target: Left chest and spine Date Range: 02/12/2020 - 03/24/2020; Target: Right lung EXAM: CT  CHEST WITH CONTRAST TECHNIQUE: Multidetector CT imaging of the chest was performed during intravenous contrast administration. CONTRAST:  33mL OMNIPAQUE IOHEXOL 300 MG/ML  SOLN COMPARISON:  Most recent CT chest 07/20/2020.  09/10/2020 PET-CT. FINDINGS: Cardiovascular: Port in the anterior chest wall with tip in distal SVC. No acute findings of the aorta great vessels. Truncation of the LEFT upper lobe pulmonary artery related to prior surgery. Mediastinum/Nodes: No axillary or supraclavicular adenopathy. No mediastinal or hilar adenopathy. No pericardial fluid. Esophagus normal. Small hypermetabolic pleural metastasis seen on comparison PET-CT scan are less conspicuous Consolidation with cavitation in the superior RIGHT upper lobe unchanged. No new nodularity. Lungs/Pleura: Upper Abdomen: Hepatic metastasis measuring 11 mm (image 141/2) appears decreased from 16 mm. No new lesions in liver. Infiltrative mass in the upper pole of the RIGHT kidney is again demonstrated concerning for metastasis. Adrenal glands normal. Musculoskeletal: New superior endplate compression fracture T12 site of prior hypermetabolic  skeletal metastasis. No new skeletal lesions are identified however lesions were relatively occult by CT imaging on comparison FDG PET scan. IMPRESSION: 1. No evidence of metastatic lung cancer progression. 2. Stable consolidation and cavitation in the superior aspect of the RIGHT upper lobe. 3. Previously hypermetabolic small hypermetabolic pleural nodules are less conspicuous. 4. LEFT hepatic lobe metastatic lesion appears slightly decreased in size. 5. RIGHT renal metastasis is incompletely imaged. 6. No clear progression of skeletal metastasis however lesions were relatively occult on the CT portion of prior FDG PET scan. New superior endplate compression deformity at metastatic lesion at T12. Electronically Signed   By: Suzy Bouchard M.D.   On: 10/22/2020 10:21    Impression/Plan: He is recovering well  from radiosurgery.  We will arrange an MRI of his brain in 2 months and he will see neurosurgery to review those results.  I will see him back for the subsequent MRI.  He is pleased with this plan we will continue to follow closely with medical oncology as he received systemic therapy.  On date of service, in total, I spent 20 minutes on this encounter. Patient was seen in person.  _____________________________________   Eppie Gibson, MD

## 2020-11-18 ENCOUNTER — Other Ambulatory Visit: Payer: BC Managed Care – PPO

## 2020-11-18 ENCOUNTER — Inpatient Hospital Stay: Payer: BC Managed Care – PPO

## 2020-11-18 ENCOUNTER — Other Ambulatory Visit: Payer: Self-pay

## 2020-11-18 ENCOUNTER — Other Ambulatory Visit: Payer: Self-pay | Admitting: Physician Assistant

## 2020-11-18 DIAGNOSIS — D649 Anemia, unspecified: Secondary | ICD-10-CM

## 2020-11-18 DIAGNOSIS — C3482 Malignant neoplasm of overlapping sites of left bronchus and lung: Secondary | ICD-10-CM | POA: Diagnosis not present

## 2020-11-18 DIAGNOSIS — C3491 Malignant neoplasm of unspecified part of right bronchus or lung: Secondary | ICD-10-CM

## 2020-11-18 DIAGNOSIS — E860 Dehydration: Secondary | ICD-10-CM

## 2020-11-18 LAB — CMP (CANCER CENTER ONLY)
ALT: 15 U/L (ref 0–44)
AST: 21 U/L (ref 15–41)
Albumin: 3.6 g/dL (ref 3.5–5.0)
Alkaline Phosphatase: 83 U/L (ref 38–126)
Anion gap: 7 (ref 5–15)
BUN: 11 mg/dL (ref 6–20)
CO2: 27 mmol/L (ref 22–32)
Calcium: 9.4 mg/dL (ref 8.9–10.3)
Chloride: 107 mmol/L (ref 98–111)
Creatinine: 0.95 mg/dL (ref 0.61–1.24)
GFR, Estimated: >60 mL/min (ref >60)
Glucose, Bld: 112 mg/dL — ABNORMAL HIGH (ref 70–99)
Potassium: 4 mmol/L (ref 3.5–5.1)
Sodium: 141 mmol/L (ref 135–145)
Total Bilirubin: 0.7 mg/dL (ref 0.3–1.2)
Total Protein: 6.7 g/dL (ref 6.5–8.1)

## 2020-11-18 LAB — CBC WITH DIFFERENTIAL (CANCER CENTER ONLY)
Abs Immature Granulocytes: 0.01 10*3/uL (ref 0.00–0.07)
Basophils Absolute: 0 10*3/uL (ref 0.0–0.1)
Basophils Relative: 0 %
Eosinophils Absolute: 0 10*3/uL (ref 0.0–0.5)
Eosinophils Relative: 0 %
HCT: 20.9 % — ABNORMAL LOW (ref 39.0–52.0)
Hemoglobin: 7.5 g/dL — ABNORMAL LOW (ref 13.0–17.0)
Immature Granulocytes: 0 %
Lymphocytes Relative: 8 %
Lymphs Abs: 0.3 10*3/uL — ABNORMAL LOW (ref 0.7–4.0)
MCH: 32.5 pg (ref 26.0–34.0)
MCHC: 35.9 g/dL (ref 30.0–36.0)
MCV: 90.5 fL (ref 80.0–100.0)
Monocytes Absolute: 0.4 10*3/uL (ref 0.1–1.0)
Monocytes Relative: 12 %
Neutro Abs: 2.8 10*3/uL (ref 1.7–7.7)
Neutrophils Relative %: 80 %
Platelet Count: 97 10*3/uL — ABNORMAL LOW (ref 150–400)
RBC: 2.31 MIL/uL — ABNORMAL LOW (ref 4.22–5.81)
RDW: 19.9 % — ABNORMAL HIGH (ref 11.5–15.5)
WBC Count: 3.5 10*3/uL — ABNORMAL LOW (ref 4.0–10.5)
nRBC: 0 % (ref 0.0–0.2)

## 2020-11-18 LAB — SAMPLE TO BLOOD BANK

## 2020-11-18 MED ORDER — HEPARIN SOD (PORK) LOCK FLUSH 100 UNIT/ML IV SOLN
500.0000 [IU] | Freq: Once | INTRAVENOUS | Status: AC | PRN
Start: 2020-11-18 — End: 2020-11-18
  Administered 2020-11-18: 500 [IU]
  Filled 2020-11-18: qty 5

## 2020-11-18 MED ORDER — SODIUM CHLORIDE 0.9% FLUSH
10.0000 mL | Freq: Once | INTRAVENOUS | Status: AC | PRN
  Administered 2020-11-18: 10 mL
  Filled 2020-11-18: qty 10

## 2020-11-19 ENCOUNTER — Other Ambulatory Visit: Payer: Self-pay | Admitting: Physician Assistant

## 2020-11-19 ENCOUNTER — Telehealth: Payer: Self-pay | Admitting: Physician Assistant

## 2020-11-19 DIAGNOSIS — C3491 Malignant neoplasm of unspecified part of right bronchus or lung: Secondary | ICD-10-CM

## 2020-11-19 NOTE — Telephone Encounter (Signed)
I called the patient to discuss arranging for 2 units of blood for his anemia. I called the patient. He states that he feels "pretty good" and him and his wife decided to leave town this weekend to go to [employer]. Denies significant fatigue, shortness of breath worse than normal, or lightheadedness. We will arrange for a lab appointment on 11/23/20 when he returns to the clinic and will arrange a blood transfusion if his Hbg is <8 at that time. Of course, I discussed with the patient if he has worsening symptoms in the interval such as significant fatigue, weakness, shortness of breath, dizziness, light headedness, or chest discomfort, to seek emergency evaluation.

## 2020-11-20 ENCOUNTER — Telehealth: Payer: Self-pay | Admitting: Physician Assistant

## 2020-11-20 ENCOUNTER — Telehealth: Payer: Self-pay | Admitting: Internal Medicine

## 2020-11-20 NOTE — Telephone Encounter (Signed)
Scheduled appts per 7/28 sch msg. Pt aware.

## 2020-11-20 NOTE — Telephone Encounter (Signed)
Appts changed due to staffing shortages. Pt is aware of all changes and is aware of all appts including his appt in [place]>

## 2020-11-23 ENCOUNTER — Inpatient Hospital Stay: Payer: BC Managed Care – PPO | Attending: Internal Medicine

## 2020-11-23 ENCOUNTER — Other Ambulatory Visit: Payer: BC Managed Care – PPO

## 2020-11-23 ENCOUNTER — Other Ambulatory Visit: Payer: Self-pay

## 2020-11-23 ENCOUNTER — Other Ambulatory Visit: Payer: Self-pay | Admitting: Physician Assistant

## 2020-11-23 ENCOUNTER — Ambulatory Visit: Payer: BC Managed Care – PPO

## 2020-11-23 ENCOUNTER — Inpatient Hospital Stay: Payer: BC Managed Care – PPO

## 2020-11-23 DIAGNOSIS — Z923 Personal history of irradiation: Secondary | ICD-10-CM | POA: Diagnosis not present

## 2020-11-23 DIAGNOSIS — Z5111 Encounter for antineoplastic chemotherapy: Secondary | ICD-10-CM | POA: Insufficient documentation

## 2020-11-23 DIAGNOSIS — Z95828 Presence of other vascular implants and grafts: Secondary | ICD-10-CM

## 2020-11-23 DIAGNOSIS — Z79899 Other long term (current) drug therapy: Secondary | ICD-10-CM | POA: Insufficient documentation

## 2020-11-23 DIAGNOSIS — C787 Secondary malignant neoplasm of liver and intrahepatic bile duct: Secondary | ICD-10-CM | POA: Insufficient documentation

## 2020-11-23 DIAGNOSIS — C7951 Secondary malignant neoplasm of bone: Secondary | ICD-10-CM | POA: Insufficient documentation

## 2020-11-23 DIAGNOSIS — I10 Essential (primary) hypertension: Secondary | ICD-10-CM | POA: Insufficient documentation

## 2020-11-23 DIAGNOSIS — D6481 Anemia due to antineoplastic chemotherapy: Secondary | ICD-10-CM | POA: Insufficient documentation

## 2020-11-23 DIAGNOSIS — Z5112 Encounter for antineoplastic immunotherapy: Secondary | ICD-10-CM | POA: Insufficient documentation

## 2020-11-23 DIAGNOSIS — C7901 Secondary malignant neoplasm of right kidney and renal pelvis: Secondary | ICD-10-CM | POA: Insufficient documentation

## 2020-11-23 DIAGNOSIS — C3491 Malignant neoplasm of unspecified part of right bronchus or lung: Secondary | ICD-10-CM

## 2020-11-23 DIAGNOSIS — C772 Secondary and unspecified malignant neoplasm of intra-abdominal lymph nodes: Secondary | ICD-10-CM | POA: Insufficient documentation

## 2020-11-23 DIAGNOSIS — T451X5A Adverse effect of antineoplastic and immunosuppressive drugs, initial encounter: Secondary | ICD-10-CM | POA: Diagnosis not present

## 2020-11-23 DIAGNOSIS — C3482 Malignant neoplasm of overlapping sites of left bronchus and lung: Secondary | ICD-10-CM | POA: Insufficient documentation

## 2020-11-23 LAB — CMP (CANCER CENTER ONLY)
ALT: 15 U/L (ref 0–44)
AST: 22 U/L (ref 15–41)
Albumin: 3.5 g/dL (ref 3.5–5.0)
Alkaline Phosphatase: 73 U/L (ref 38–126)
Anion gap: 5 (ref 5–15)
BUN: 11 mg/dL (ref 6–20)
CO2: 26 mmol/L (ref 22–32)
Calcium: 9.5 mg/dL (ref 8.9–10.3)
Chloride: 109 mmol/L (ref 98–111)
Creatinine: 0.93 mg/dL (ref 0.61–1.24)
GFR, Estimated: >60 mL/min (ref >60)
Glucose, Bld: 95 mg/dL (ref 70–99)
Potassium: 3.7 mmol/L (ref 3.5–5.1)
Sodium: 140 mmol/L (ref 135–145)
Total Bilirubin: 0.7 mg/dL (ref 0.3–1.2)
Total Protein: 6.5 g/dL (ref 6.5–8.1)

## 2020-11-23 LAB — CBC WITH DIFFERENTIAL (CANCER CENTER ONLY)
Abs Immature Granulocytes: 0.01 10*3/uL (ref 0.00–0.07)
Basophils Absolute: 0 10*3/uL (ref 0.0–0.1)
Basophils Relative: 0 %
Eosinophils Absolute: 0 10*3/uL (ref 0.0–0.5)
Eosinophils Relative: 1 %
HCT: 23 % — ABNORMAL LOW (ref 39.0–52.0)
Hemoglobin: 7.9 g/dL — ABNORMAL LOW (ref 13.0–17.0)
Immature Granulocytes: 0 %
Lymphocytes Relative: 10 %
Lymphs Abs: 0.3 10*3/uL — ABNORMAL LOW (ref 0.7–4.0)
MCH: 32.5 pg (ref 26.0–34.0)
MCHC: 34.3 g/dL (ref 30.0–36.0)
MCV: 94.7 fL (ref 80.0–100.0)
Monocytes Absolute: 0.6 10*3/uL (ref 0.1–1.0)
Monocytes Relative: 23 %
Neutro Abs: 1.7 10*3/uL (ref 1.7–7.7)
Neutrophils Relative %: 66 %
Platelet Count: 131 10*3/uL — ABNORMAL LOW (ref 150–400)
RBC: 2.43 MIL/uL — ABNORMAL LOW (ref 4.22–5.81)
RDW: 22.2 % — ABNORMAL HIGH (ref 11.5–15.5)
WBC Count: 2.6 10*3/uL — ABNORMAL LOW (ref 4.0–10.5)
nRBC: 0 % (ref 0.0–0.2)

## 2020-11-23 LAB — SAMPLE TO BLOOD BANK

## 2020-11-23 LAB — PREPARE RBC (CROSSMATCH)

## 2020-11-23 MED ORDER — ACETAMINOPHEN 325 MG PO TABS
ORAL_TABLET | ORAL | Status: AC
  Filled 2020-11-23: qty 2

## 2020-11-23 MED ORDER — SODIUM CHLORIDE 0.9% IV SOLUTION
250.0000 mL | Freq: Once | INTRAVENOUS | Status: AC
  Administered 2020-11-23: 250 mL via INTRAVENOUS
  Filled 2020-11-23: qty 250

## 2020-11-23 MED ORDER — SODIUM CHLORIDE 0.9% FLUSH
10.0000 mL | INTRAVENOUS | Status: AC | PRN
  Administered 2020-11-23: 10 mL
  Filled 2020-11-23: qty 10

## 2020-11-23 MED ORDER — HEPARIN SOD (PORK) LOCK FLUSH 100 UNIT/ML IV SOLN
500.0000 [IU] | Freq: Every day | INTRAVENOUS | Status: AC | PRN
  Administered 2020-11-23: 500 [IU]
  Filled 2020-11-23: qty 5

## 2020-11-23 MED ORDER — ACETAMINOPHEN 325 MG PO TABS
650.0000 mg | ORAL_TABLET | Freq: Once | ORAL | Status: AC
  Administered 2020-11-23: 650 mg via ORAL

## 2020-11-23 MED ORDER — DIPHENHYDRAMINE HCL 25 MG PO CAPS
25.0000 mg | ORAL_CAPSULE | Freq: Once | ORAL | Status: AC
  Administered 2020-11-23: 25 mg via ORAL

## 2020-11-23 MED ORDER — DIPHENHYDRAMINE HCL 25 MG PO CAPS
ORAL_CAPSULE | ORAL | Status: AC
  Filled 2020-11-23: qty 1

## 2020-11-23 NOTE — Patient Instructions (Signed)
https://www.redcrossblood.org/donate-blood/blood-donation-process/what-happens-to-donated-blood/blood-transfusions/types-of-blood-transfusions.html"> https://www.hematology.org/education/patients/blood-basics/blood-safety-and-matching"> https://www.nhlbi.nih.gov/health-topics/blood-transfusion">  Blood Transfusion, Adult A blood transfusion is a procedure in which you receive blood or a type of blood cell (blood component) through an IV. You may need a blood transfusion when your blood level is low. This may result from a bleeding disorder, illness, injury, or surgery. The blood may come from a donor. You may also be able to donate blood for yourself (autologous blood donation) before a planned surgery. The blood given in a transfusion is made up of different blood components. You may receive: Red blood cells. These carry oxygen to the cells in the body. Platelets. These help your blood to clot. Plasma. This is the liquid part of your blood. It carries proteins and other substances throughout the body. White blood cells. These help you fight infections. If you have hemophilia or another clotting disorder, you may also receive othertypes of blood products. Tell a health care provider about: Any blood disorders you have. Any previous reactions you have had during a blood transfusion. Any allergies you have. All medicines you are taking, including vitamins, herbs, eye drops, creams, and over-the-counter medicines. Any surgeries you have had. Any medical conditions you have, including any recent fever or cold symptoms. Whether you are pregnant or may be pregnant. What are the risks? Generally, this is a safe procedure. However, problems may occur. The most common problems include: A mild allergic reaction, such as red, swollen areas of skin (hives) and itching. Fever or chills. This may be the body's response to new blood cells received. This may occur during or up to 4 hours after the  transfusion. More serious problems may include: Transfusion-associated circulatory overload (TACO), or too much fluid in the lungs. This may cause breathing problems. A serious allergic reaction, such as difficulty breathing or swelling around the face and lips. Transfusion-related acute lung injury (TRALI), which causes breathing difficulty and low oxygen in the blood. This can occur within hours of the transfusion or several days later. Iron overload. This can happen after receiving many blood transfusions over a period of time. Infection or virus being transmitted. This is rare because donated blood is carefully tested before it is given. Hemolytic transfusion reaction. This is rare. It happens when your body's defense system (immune system)tries to attack the new blood cells. Symptoms may include fever, chills, nausea, low blood pressure, and low back or chest pain. Transfusion-associated graft-versus-host disease (TAGVHD). This is rare. It happens when donated cells attack your body's healthy tissues. What happens before the procedure? Medicines Ask your health care provider about: Changing or stopping your regular medicines. This is especially important if you are taking diabetes medicines or blood thinners. Taking medicines such as aspirin and ibuprofen. These medicines can thin your blood. Do not take these medicines unless your health care provider tells you to take them. Taking over-the-counter medicines, vitamins, herbs, and supplements. General instructions Follow instructions from your health care provider about eating and drinking restrictions. You will have a blood test to determine your blood type. This is necessary to know what kind of blood your body will accept and to match it to the donor blood. If you are going to have a planned surgery, you may be able to do an autologous blood donation. This may be done in case you need to have a transfusion. You will have your temperature,  blood pressure, and pulse monitored before the transfusion. If you have had an allergic reaction to a transfusion in the past, you may be given   medicine to help prevent a reaction. This medicine may be given to you by mouth (orally) or through an IV. Set aside time for the blood transfusion. This procedure generally takes 1-4 hours to complete. What happens during the procedure?  An IV will be inserted into one of your veins. The bag of donated blood will be attached to your IV. The blood will then enter through your vein. Your temperature, blood pressure, and pulse will be monitored regularly during the transfusion. This monitoring is done to detect early signs of a transfusion reaction. Tell your nurse right away if you have any of these symptoms during the transfusion: Shortness of breath or trouble breathing. Chest or back pain. Fever or chills. Hives or itching. If you have any signs or symptoms of a reaction, your transfusion will be stopped and you may be given medicine. When the transfusion is complete, your IV will be removed. Pressure may be applied to the IV site for a few minutes. A bandage (dressing)will be applied. The procedure may vary among health care providers and hospitals. What happens after the procedure? Your temperature, blood pressure, pulse, breathing rate, and blood oxygen level will be monitored until you leave the hospital or clinic. Your blood may be tested to see how you are responding to the transfusion. You may be warmed with fluids or blankets to maintain a normal body temperature. If you receive your blood transfusion in an outpatient setting, you will be told whom to contact to report any reactions. Where to find more information For more information on blood transfusions, visit the American Red Cross: redcross.org Summary A blood transfusion is a procedure in which you receive blood or a type of blood cell (blood component) through an IV. The blood you  receive may come from a donor or be donated by yourself (autologous blood donation) before a planned surgery. The blood given in a transfusion is made up of different blood components. You may receive red blood cells, platelets, plasma, or white blood cells depending on the condition treated. Your temperature, blood pressure, and pulse will be monitored before, during, and after the transfusion. After the transfusion, your blood may be tested to see how your body has responded. This information is not intended to replace advice given to you by your health care provider. Make sure you discuss any questions you have with your healthcare provider. Document Revised: 02/14/2019 Document Reviewed: 10/04/2018 Elsevier Patient Education  2022 Elsevier Inc.  

## 2020-11-24 ENCOUNTER — Other Ambulatory Visit: Payer: Self-pay | Admitting: Pulmonary Disease

## 2020-11-24 LAB — TYPE AND SCREEN
ABO/RH(D): O POS
Antibody Screen: NEGATIVE
Unit division: 0

## 2020-11-24 LAB — BPAM RBC
Blood Product Expiration Date: 202209032359
ISSUE DATE / TIME: 202208011120
Unit Type and Rh: 5100

## 2020-11-25 ENCOUNTER — Other Ambulatory Visit: Payer: Self-pay | Admitting: Internal Medicine

## 2020-11-25 ENCOUNTER — Encounter: Payer: Self-pay | Admitting: Internal Medicine

## 2020-11-25 ENCOUNTER — Inpatient Hospital Stay: Payer: BC Managed Care – PPO

## 2020-11-25 ENCOUNTER — Other Ambulatory Visit: Payer: BC Managed Care – PPO

## 2020-11-25 ENCOUNTER — Inpatient Hospital Stay (HOSPITAL_BASED_OUTPATIENT_CLINIC_OR_DEPARTMENT_OTHER): Payer: BC Managed Care – PPO | Admitting: Internal Medicine

## 2020-11-25 ENCOUNTER — Other Ambulatory Visit: Payer: Self-pay

## 2020-11-25 VITALS — BP 133/66 | HR 95 | Temp 97.9°F | Resp 20 | Ht 70.0 in | Wt 195.1 lb

## 2020-11-25 DIAGNOSIS — Z5112 Encounter for antineoplastic immunotherapy: Secondary | ICD-10-CM

## 2020-11-25 DIAGNOSIS — C7931 Secondary malignant neoplasm of brain: Secondary | ICD-10-CM

## 2020-11-25 DIAGNOSIS — C3490 Malignant neoplasm of unspecified part of unspecified bronchus or lung: Secondary | ICD-10-CM

## 2020-11-25 DIAGNOSIS — C3491 Malignant neoplasm of unspecified part of right bronchus or lung: Secondary | ICD-10-CM

## 2020-11-25 DIAGNOSIS — C3482 Malignant neoplasm of overlapping sites of left bronchus and lung: Secondary | ICD-10-CM | POA: Diagnosis not present

## 2020-11-25 DIAGNOSIS — Z5111 Encounter for antineoplastic chemotherapy: Secondary | ICD-10-CM

## 2020-11-25 DIAGNOSIS — E860 Dehydration: Secondary | ICD-10-CM

## 2020-11-25 DIAGNOSIS — D649 Anemia, unspecified: Secondary | ICD-10-CM

## 2020-11-25 LAB — CBC WITH DIFFERENTIAL (CANCER CENTER ONLY)
Abs Immature Granulocytes: 0.04 10*3/uL (ref 0.00–0.07)
Basophils Absolute: 0 10*3/uL (ref 0.0–0.1)
Basophils Relative: 0 %
Eosinophils Absolute: 0 10*3/uL (ref 0.0–0.5)
Eosinophils Relative: 1 %
HCT: 26.4 % — ABNORMAL LOW (ref 39.0–52.0)
Hemoglobin: 9.2 g/dL — ABNORMAL LOW (ref 13.0–17.0)
Immature Granulocytes: 1 %
Lymphocytes Relative: 8 %
Lymphs Abs: 0.3 10*3/uL — ABNORMAL LOW (ref 0.7–4.0)
MCH: 32.3 pg (ref 26.0–34.0)
MCHC: 34.8 g/dL (ref 30.0–36.0)
MCV: 92.6 fL (ref 80.0–100.0)
Monocytes Absolute: 0.7 10*3/uL (ref 0.1–1.0)
Monocytes Relative: 19 %
Neutro Abs: 2.5 10*3/uL (ref 1.7–7.7)
Neutrophils Relative %: 71 %
Platelet Count: 137 10*3/uL — ABNORMAL LOW (ref 150–400)
RBC: 2.85 MIL/uL — ABNORMAL LOW (ref 4.22–5.81)
RDW: 21.2 % — ABNORMAL HIGH (ref 11.5–15.5)
WBC Count: 3.5 10*3/uL — ABNORMAL LOW (ref 4.0–10.5)
nRBC: 0 % (ref 0.0–0.2)

## 2020-11-25 LAB — CMP (CANCER CENTER ONLY)
ALT: 15 U/L (ref 0–44)
AST: 22 U/L (ref 15–41)
Albumin: 3.7 g/dL (ref 3.5–5.0)
Alkaline Phosphatase: 75 U/L (ref 38–126)
Anion gap: 9 (ref 5–15)
BUN: 12 mg/dL (ref 6–20)
CO2: 24 mmol/L (ref 22–32)
Calcium: 9.4 mg/dL (ref 8.9–10.3)
Chloride: 106 mmol/L (ref 98–111)
Creatinine: 0.99 mg/dL (ref 0.61–1.24)
GFR, Estimated: >60 mL/min (ref >60)
Glucose, Bld: 111 mg/dL — ABNORMAL HIGH (ref 70–99)
Potassium: 3.5 mmol/L (ref 3.5–5.1)
Sodium: 139 mmol/L (ref 135–145)
Total Bilirubin: 0.6 mg/dL (ref 0.3–1.2)
Total Protein: 6.7 g/dL (ref 6.5–8.1)

## 2020-11-25 LAB — SAMPLE TO BLOOD BANK

## 2020-11-25 MED ORDER — SODIUM CHLORIDE 0.9 % IV SOLN
Freq: Once | INTRAVENOUS | Status: AC
  Filled 2020-11-25: qty 250

## 2020-11-25 MED ORDER — PALONOSETRON HCL INJECTION 0.25 MG/5ML
0.2500 mg | Freq: Once | INTRAVENOUS | Status: AC
  Administered 2020-11-25: 0.25 mg via INTRAVENOUS

## 2020-11-25 MED ORDER — SODIUM CHLORIDE 0.9 % IV SOLN
200.0000 mg | Freq: Once | INTRAVENOUS | Status: AC
  Administered 2020-11-25: 200 mg via INTRAVENOUS
  Filled 2020-11-25: qty 8

## 2020-11-25 MED ORDER — SODIUM CHLORIDE 0.9% FLUSH
10.0000 mL | Freq: Once | INTRAVENOUS | Status: AC | PRN
  Administered 2020-11-25: 10 mL
  Filled 2020-11-25: qty 10

## 2020-11-25 MED ORDER — SODIUM CHLORIDE 0.9 % IV SOLN
150.0000 mg | Freq: Once | INTRAVENOUS | Status: AC
  Administered 2020-11-25: 150 mg via INTRAVENOUS
  Filled 2020-11-25: qty 150

## 2020-11-25 MED ORDER — SODIUM CHLORIDE 0.9 % IV SOLN
10.0000 mg | Freq: Once | INTRAVENOUS | Status: AC
  Administered 2020-11-25: 10 mg via INTRAVENOUS
  Filled 2020-11-25: qty 10

## 2020-11-25 MED ORDER — SODIUM CHLORIDE 0.9% FLUSH
10.0000 mL | INTRAVENOUS | Status: DC | PRN
  Administered 2020-11-25: 10 mL
  Filled 2020-11-25: qty 10

## 2020-11-25 MED ORDER — SODIUM CHLORIDE 0.9 % IV SOLN
500.0000 mg/m2 | Freq: Once | INTRAVENOUS | Status: AC
  Administered 2020-11-25: 1000 mg via INTRAVENOUS
  Filled 2020-11-25: qty 40

## 2020-11-25 MED ORDER — PALONOSETRON HCL INJECTION 0.25 MG/5ML
INTRAVENOUS | Status: AC
  Filled 2020-11-25: qty 5

## 2020-11-25 MED ORDER — HEPARIN SOD (PORK) LOCK FLUSH 100 UNIT/ML IV SOLN
500.0000 [IU] | Freq: Once | INTRAVENOUS | Status: AC | PRN
  Administered 2020-11-25: 500 [IU]
  Filled 2020-11-25: qty 5

## 2020-11-25 MED ORDER — SODIUM CHLORIDE 0.9 % IV SOLN
607.0000 mg | Freq: Once | INTRAVENOUS | Status: AC
  Administered 2020-11-25: 610 mg via INTRAVENOUS
  Filled 2020-11-25: qty 61

## 2020-11-25 NOTE — Patient Instructions (Signed)
[facility]  Discharge Instructions: Thank you for choosing [facility] to provide your oncology and hematology care.   If you have a lab appointment with the [facility], please go directly to the [facility] and check in at the registration area.   Wear comfortable clothing and clothing appropriate for easy access to any Portacath or PICC line.   We strive to give you quality time with your provider. You may need to reschedule your appointment if you arrive late (15 or more minutes).  Arriving late affects you and other patients whose appointments are after yours.  Also, if you miss three or more appointments without notifying the office, you may be dismissed from the clinic at the provider's discretion.      For prescription refill requests, have your pharmacy contact our office and allow 72 hours for refills to be completed.    Today you received the following chemotherapy and/or immunotherapy agents; Keytruda, Alimta, carboplatin     To help prevent nausea and vomiting after your treatment, we encourage you to take your nausea medication as directed.  BELOW ARE SYMPTOMS THAT SHOULD BE REPORTED IMMEDIATELY: *FEVER GREATER THAN 100.4 F (38 C) OR HIGHER *CHILLS OR SWEATING *NAUSEA AND VOMITING THAT IS NOT CONTROLLED WITH YOUR NAUSEA MEDICATION *UNUSUAL SHORTNESS OF BREATH *UNUSUAL BRUISING OR BLEEDING *URINARY PROBLEMS (pain or burning when urinating, or frequent urination) *BOWEL PROBLEMS (unusual diarrhea, constipation, pain near the anus) TENDERNESS IN MOUTH AND THROAT WITH OR WITHOUT PRESENCE OF ULCERS (sore throat, sores in mouth, or a toothache) UNUSUAL RASH, SWELLING OR PAIN  UNUSUAL VAGINAL DISCHARGE OR ITCHING   Items with * indicate a potential emergency and should be followed up as soon as possible or go to the Emergency Department if any problems should occur.  Please show the CHEMOTHERAPY ALERT CARD or IMMUNOTHERAPY ALERT  CARD at check-in to the Emergency Department and triage nurse.  Should you have questions after your visit or need to cancel or reschedule your appointment, please contact [facility]  Dept: [phone]  and follow the prompts.  Office hours are 8:00 a.m. to 4:30 p.m. Monday - Friday. Please note that voicemails left after 4:00 p.m. may not be returned until the following business day.  We are closed weekends and major holidays. You have access to a nurse at all times for urgent questions. Please call the main number to the clinic Dept: [phone] and follow the prompts.   For any non-urgent questions, you may also contact your provider using MyChart. We now offer e-Visits for anyone 82 and older to request care online for non-urgent symptoms. For details visit [email or web address].   Also download the MyChart app! Go to the app store, search "MyChart", open the app, select [facility], and log in with your MyChart username and password.  Due to Covid, a mask is required upon entering the hospital/clinic. If you do not have a mask, one will be given to you upon arrival. For doctor visits, patients may have 1 support person aged 17 or older with them. For treatment visits, patients cannot have anyone with them due to current Covid guidelines and our immunocompromised population.

## 2020-11-25 NOTE — Progress Notes (Signed)
[facility] [phone]   [phone]  OFFICE PROGRESS NOTE  [name], DO [facility]  [address] [place] [address]  DIAGNOSIS: Metastatic non-small cell lung cancer initially diagnosed as stage IIB (T3, N0, M0) non-small cell lung cancer, adenocarcinoma presented with large central perihilar mass with suspicious groundglass opacity in the right middle lobe and left upper lobe diagnosed in September 2021.  The patient had evidence of metastatic disease to the right kidney in March 2022.  Molecular studies by guardant 360: No actionable mutations.   PDl1: 80%   PRIOR THERAPY: Concurrent chemoradiation with weekly carboplatin for AUC of 2 and paclitaxel 45 mg/M2.  First dose February 10, 2020.  Status post 5 cycles.   CURRENT THERAPY:  1) Palliative systemic chemotherapy with carboplatin AUC of 5, Alimta 500 mg per metered squared, Keytruda 200 mg IV every 3 weeks.  First dose expected on 09/24/2020. Status post 3 cycles.  2) Palliative radiation to the left anterior rib cage area as well as the mid back area under the care of Dr. Sondra Come. Last treatment on 10/05/20.  INTERVAL HISTORY: Richard Li 60 y.o. male returns to the clinic today for follow-up visit.  The patient is feeling fine today with no concerning complaints except for fatigue secondary to chemotherapy-induced anemia.  He received PRBCs transfusion few days ago.  He denied having any current chest pain but has shortness of breath with exertion with mild cough and no hemoptysis.  He denied having any fever or chills.  He has no nausea, vomiting, diarrhea or constipation.  He has no headache or visual changes.  He continues to tolerate his treatment with chemotherapy well except for the anemia.  The patient is here today for evaluation before starting cycle #4 of his treatment.   MEDICAL HISTORY: Past Medical History:  Diagnosis Date   Allergic rhinitis, cause  unspecified    Anxiety state, unspecified    Asthma    as a child   Chronic airway obstruction, not elsewhere classified    Esophageal reflux    History of kidney stones    History of radiation therapy 02/13/2020-03/24/2020   right lung        Dr Gery Pray   Hypertension    Lumbago    lung ca dx'd 12/2019   Other and unspecified hyperlipidemia    Other chest pain     ALLERGIES:  is allergic to amoxicillin.  MEDICATIONS:  Current Outpatient Medications  Medication Sig Dispense Refill   acetaminophen (TYLENOL) 500 MG tablet Take 1,000 mg by mouth 2 (two) times daily.     Brimonidine Tartrate (LUMIFY) 0.025 % SOLN Place 1 drop into both eyes 2 (two) times a week. As needed     dextromethorphan-guaiFENesin (MUCINEX DM) 30-600 MG 12hr tablet Take 1 tablet by mouth 2 (two) times daily.     doxycycline (VIBRA-TABS) 100 MG tablet Take 1 tablet by mouth twice daily 60 tablet 0   feeding supplement (ENSURE ENLIVE / ENSURE PLUS) LIQD Take 237 mLs by mouth 3 (three) times daily between meals. (Patient taking differently: Take 237 mLs by mouth 2 (two) times daily between meals.) 237 mL 12   fenofibrate 160 MG tablet Take 1 tablet by mouth once daily (Patient taking differently: Take 160 mg by mouth daily.) 90 tablet 3   ferrous sulfate 325 (65 FE) MG tablet Take 1 tablet (325 mg total) by mouth daily with breakfast. 30 tablet  0   folic acid (FOLVITE) 1 MG tablet Take 1 tablet (1 mg total) by mouth daily. 30 tablet 2   Ipratropium-Albuterol (COMBIVENT RESPIMAT) 20-100 MCG/ACT AERS respimat Inhale 1-2 puffs into the lungs every 6 (six) hours as needed for wheezing. 4 g 3   lidocaine-prilocaine (EMLA) cream Apply 1 application topically as needed. 30 g 2   LORazepam (ATIVAN) 1 MG tablet TAKE 1 TABLET BY MOUTH AT BEDTIME(MAY ALSO TAKE 1 TABLET TWICE DURING THE DAY AS NEEDED FOR ANXIETY) 90 tablet 0   meclizine (ANTIVERT) 25 MG tablet Take 1 tablet (25 mg total) by mouth every 6 (six) hours as needed  for dizziness. 60 tablet 0   Multiple Vitamin (MULTIVITAMIN WITH MINERALS) TABS tablet Take 1 tablet by mouth daily.     ondansetron (ZOFRAN) 8 MG tablet Take 1 tablet (8 mg total) by mouth every 8 (eight) hours as needed for nausea or vomiting. Starting day 3 after chemotherapy 30 tablet 2   oxyCODONE-acetaminophen (PERCOCET/ROXICET) 5-325 MG tablet Take 1 tablet by mouth every 6 (six) hours as needed for severe pain. 30 tablet 0   potassium chloride SA (KLOR-CON) 20 MEQ tablet Take 1 tablet (20 mEq total) by mouth daily. 6 tablet 0   predniSONE (DELTASONE) 10 MG tablet Take 1 tablet (10 mg total) by mouth daily with breakfast. 90 tablet 0   prochlorperazine (COMPAZINE) 10 MG tablet Take 1 tablet (10 mg total) by mouth every 6 (six) hours as needed. 30 tablet 2   No current facility-administered medications for this visit.   Facility-Administered Medications Ordered in Other Visits  Medication Dose Route Frequency Provider Last Rate Last Admin   0.9 %  sodium chloride infusion   Intravenous Continuous Curt Bears, MD   Stopped at 03/05/20 1410    SURGICAL HISTORY:  Past Surgical History:  Procedure Laterality Date   APPENDECTOMY     BRONCHIAL BIOPSY  01/07/2020   Procedure: BRONCHIAL BIOPSIES;  Surgeon: Collene Gobble, MD;  Location: [facility] ENDOSCOPY;  Service: Pulmonary;;   BRONCHIAL BIOPSY  04/05/2020   Procedure: BRONCHIAL BIOPSIES;  Surgeon: Rigoberto Noel, MD;  Location: [facility];  Service: Cardiopulmonary;;   BRONCHIAL BRUSHINGS  01/07/2020   Procedure: BRONCHIAL BRUSHINGS;  Surgeon: Collene Gobble, MD;  Location: [facility] ENDOSCOPY;  Service: Pulmonary;;   BRONCHIAL NEEDLE ASPIRATION BIOPSY  01/07/2020   Procedure: BRONCHIAL NEEDLE ASPIRATION BIOPSIES;  Surgeon: Collene Gobble, MD;  Location: [facility] ENDOSCOPY;  Service: Pulmonary;;   BRONCHIAL WASHINGS  01/07/2020   Procedure: BRONCHIAL WASHINGS;  Surgeon: Collene Gobble, MD;  [facility] ENDOSCOPY;  Service: Pulmonary;;   BRONCHIAL  WASHINGS  04/05/2020   Procedure: BRONCHIAL WASHINGS;  Surgeon: Rigoberto Noel, MD;  Location: [facility];  Service: Cardiopulmonary;;   COLONOSCOPY  15 years ago   HEMOSTASIS CONTROL  01/07/2020   Procedure: HEMOSTASIS CONTROL;  Surgeon: Collene Gobble, MD;  Location[facility] ENDOSCOPY;  Service: Pulmonary;;  cold saline   IR IMAGING GUIDED PORT INSERTION  09/18/2020   NASAL TURBINATE REDUCTION  2002   Dr.Crossley   VASECTOMY     VIDEO BRONCHOSCOPY Right 04/05/2020   Procedure: VIDEO BRONCHOSCOPY WITH FLUORO;  Surgeon: Rigoberto Noel, MD;  Location: [facility];  Service: Cardiopulmonary;  Laterality: Right;   VIDEO BRONCHOSCOPY WITH ENDOBRONCHIAL NAVIGATION N/A 01/07/2020   Procedure: VIDEO BRONCHOSCOPY WITH ENDOBRONCHIAL NAVIGATION;  Surgeon: Collene Gobble, MD;  Location: [facility] ENDOSCOPY;  Service: Pulmonary;  Laterality: N/A;   VIDEO BRONCHOSCOPY WITH ENDOBRONCHIAL ULTRASOUND N/A  01/07/2020   Procedure: VIDEO BRONCHOSCOPY WITH ENDOBRONCHIAL ULTRASOUND;  Surgeon: Collene Gobble, MD;  Location: [facility] ENDOSCOPY;  Service: Pulmonary;  Laterality: N/A;    REVIEW OF SYSTEMS:  A comprehensive review of systems was negative except for: Constitutional: positive for fatigue Respiratory: positive for cough and dyspnea on exertion   PHYSICAL EXAMINATION: General appearance: alert, cooperative, fatigued, and no distress Head: Normocephalic, without obvious abnormality, atraumatic Neck: no adenopathy, no JVD, supple, symmetrical, trachea midline, and thyroid not enlarged, symmetric, no tenderness/mass/nodules Lymph nodes: Cervical, supraclavicular, and axillary nodes normal. Resp: clear to auscultation bilaterally Back: symmetric, no curvature. ROM normal. No CVA tenderness. Cardio: regular rate and rhythm, S1, S2 normal, no murmur, click, rub or gallop GI: soft, non-tender; bowel sounds normal; no masses,  no organomegaly Extremities: extremities normal, atraumatic, no cyanosis or edema  ECOG  PERFORMANCE STATUS: 1 - Symptomatic but completely ambulatory  Blood pressure 133/66, pulse 95, temperature 97.9 F (36.6 C), temperature source Tympanic, resp. rate 20, height _0  (1.778 m), weight 195 lb 1.6 oz (88.5 kg), SpO2 100 %.  LABORATORY DATA: Lab Results  Component Value Date   WBC 3.5 (L) 11/25/2020   HGB 9.2 (L) 11/25/2020   HCT 26.4 (L) 11/25/2020   MCV 92.6 11/25/2020   PLT 137 (L) 11/25/2020      Chemistry      Component Value Date/Time   NA 140 11/23/2020 0933   K 3.7 11/23/2020 0933   CL 109 11/23/2020 0933   CO2 26 11/23/2020 0933   BUN 11 11/23/2020 0933   CREATININE 0.93 11/23/2020 0933   CREATININE 0.67 (L) 06/24/2020 1517      Component Value Date/Time   CALCIUM 9.5 11/23/2020 0933   ALKPHOS 73 11/23/2020 0933   AST 22 11/23/2020 0933   ALT 15 11/23/2020 0933   BILITOT 0.7 11/23/2020 0933       RADIOGRAPHIC STUDIES: No results found.   ASSESSMENT AND PLAN: This is a very pleasant 60 years old white male with metastatic non-small cell lung cancer that was initially diagnosed as stage IIb (T3, N0, M0) non-small cell lung cancer, adenocarcinoma presented with perihilar right upper lobe lung mass with suspicious groundglass opacity in the right middle lobe.  He had evidence for disease metastasis in March 2022 to the right kidney that was biopsy-proven in April 2022. The patient is not a good surgical candidate for resection according to Dr. Roxan Hockey. The patient underwent a course of concurrent chemoradiation with weekly carboplatin for AUC of 2 and paclitaxel 45 mg/M2 status post 5 cycles.  He has been tolerating this treatment well except for fatigue, mild odynophagia as well as recurrent pneumonia and septicemia.  The patient also has radiation-induced pneumonitis. He was admitted to the hospital several times with recurrent pneumonia.   The patient had evidence for metastatic disease in April 2022 and he is currently undergoing systemic  chemotherapy with carboplatin for AUC of 5, Alimta 500 Mg/M2 and Keytruda 200 Mg IV every 3 weeks status post 3 cycles. The patient continues to tolerate this treatment well except for fatigue secondary to chemotherapy-induced anemia. I recommended for him to proceed with cycle #4 today. I will see him back for follow-up visit in 3 weeks for evaluation with repeat CT scan of the chest, abdomen pelvis for restaging of his disease. For the chemotherapy-induced anemia, he received 1 unit of PRBCs transfusion few days ago.  We will continue to monitor his hemoglobin and hematocrit closely and consider him for transfusion  if needed. For pain management I will give him refill of oxycodone and he will continue to alternate with Tylenol on as-needed basis. The patient was advised to call immediately if he has any other concerning symptoms in the interval. The patient voices understanding of current disease status and treatment options and is in agreement with the current care plan.  All questions were answered. The patient knows to call the clinic with any problems, questions or concerns. We can certainly see the patient much sooner if necessary.  Disclaimer: This note was dictated with voice recognition software. Similar sounding words can inadvertently be transcribed and may not be corrected upon review.

## 2020-11-26 LAB — TSH: TSH: 0.703 u[IU]/mL (ref 0.320–4.118)

## 2020-12-01 ENCOUNTER — Encounter: Payer: Self-pay | Admitting: Internal Medicine

## 2020-12-03 ENCOUNTER — Inpatient Hospital Stay: Payer: BC Managed Care – PPO

## 2020-12-03 ENCOUNTER — Other Ambulatory Visit: Payer: Self-pay

## 2020-12-03 DIAGNOSIS — C3490 Malignant neoplasm of unspecified part of unspecified bronchus or lung: Secondary | ICD-10-CM

## 2020-12-03 DIAGNOSIS — D649 Anemia, unspecified: Secondary | ICD-10-CM

## 2020-12-03 DIAGNOSIS — C3482 Malignant neoplasm of overlapping sites of left bronchus and lung: Secondary | ICD-10-CM | POA: Diagnosis not present

## 2020-12-03 DIAGNOSIS — E860 Dehydration: Secondary | ICD-10-CM

## 2020-12-03 DIAGNOSIS — C3491 Malignant neoplasm of unspecified part of right bronchus or lung: Secondary | ICD-10-CM

## 2020-12-03 LAB — CBC WITH DIFFERENTIAL (CANCER CENTER ONLY)
Abs Immature Granulocytes: 0.02 10*3/uL (ref 0.00–0.07)
Basophils Absolute: 0 10*3/uL (ref 0.0–0.1)
Basophils Relative: 0 %
Eosinophils Absolute: 0 10*3/uL (ref 0.0–0.5)
Eosinophils Relative: 1 %
HCT: 23.7 % — ABNORMAL LOW (ref 39.0–52.0)
Hemoglobin: 8.4 g/dL — ABNORMAL LOW (ref 13.0–17.0)
Immature Granulocytes: 1 %
Lymphocytes Relative: 19 %
Lymphs Abs: 0.5 10*3/uL — ABNORMAL LOW (ref 0.7–4.0)
MCH: 32.7 pg (ref 26.0–34.0)
MCHC: 35.4 g/dL (ref 30.0–36.0)
MCV: 92.2 fL (ref 80.0–100.0)
Monocytes Absolute: 0.5 10*3/uL (ref 0.1–1.0)
Monocytes Relative: 18 %
Neutro Abs: 1.5 10*3/uL — ABNORMAL LOW (ref 1.7–7.7)
Neutrophils Relative %: 61 %
Platelet Count: 113 10*3/uL — ABNORMAL LOW (ref 150–400)
RBC: 2.57 MIL/uL — ABNORMAL LOW (ref 4.22–5.81)
RDW: 18.3 % — ABNORMAL HIGH (ref 11.5–15.5)
WBC Count: 2.5 10*3/uL — ABNORMAL LOW (ref 4.0–10.5)
nRBC: 0 % (ref 0.0–0.2)

## 2020-12-03 LAB — CMP (CANCER CENTER ONLY)
ALT: 34 U/L (ref 0–44)
AST: 37 U/L (ref 15–41)
Albumin: 3.6 g/dL (ref 3.5–5.0)
Alkaline Phosphatase: 84 U/L (ref 38–126)
Anion gap: 9 (ref 5–15)
BUN: 16 mg/dL (ref 6–20)
CO2: 26 mmol/L (ref 22–32)
Calcium: 9.3 mg/dL (ref 8.9–10.3)
Chloride: 104 mmol/L (ref 98–111)
Creatinine: 0.91 mg/dL (ref 0.61–1.24)
GFR, Estimated: >60 mL/min (ref >60)
Glucose, Bld: 98 mg/dL (ref 70–99)
Potassium: 3.7 mmol/L (ref 3.5–5.1)
Sodium: 139 mmol/L (ref 135–145)
Total Bilirubin: 0.8 mg/dL (ref 0.3–1.2)
Total Protein: 6.7 g/dL (ref 6.5–8.1)

## 2020-12-03 LAB — TYPE AND SCREEN
ABO/RH(D): O POS
Antibody Screen: NEGATIVE

## 2020-12-03 MED ORDER — SODIUM CHLORIDE 0.9% FLUSH
10.0000 mL | Freq: Once | INTRAVENOUS | Status: AC | PRN
  Administered 2020-12-03: 10 mL
  Filled 2020-12-03: qty 10

## 2020-12-03 MED ORDER — HEPARIN SOD (PORK) LOCK FLUSH 100 UNIT/ML IV SOLN
500.0000 [IU] | Freq: Once | INTRAVENOUS | Status: AC | PRN
  Administered 2020-12-03: 500 [IU]
  Filled 2020-12-03: qty 5

## 2020-12-04 ENCOUNTER — Encounter: Payer: Self-pay | Admitting: Family Medicine

## 2020-12-04 ENCOUNTER — Ambulatory Visit (INDEPENDENT_AMBULATORY_CARE_PROVIDER_SITE_OTHER): Payer: BC Managed Care – PPO | Admitting: Family Medicine

## 2020-12-04 DIAGNOSIS — J449 Chronic obstructive pulmonary disease, unspecified: Secondary | ICD-10-CM | POA: Diagnosis not present

## 2020-12-04 DIAGNOSIS — C3491 Malignant neoplasm of unspecified part of right bronchus or lung: Secondary | ICD-10-CM | POA: Diagnosis not present

## 2020-12-04 DIAGNOSIS — F411 Generalized anxiety disorder: Secondary | ICD-10-CM | POA: Diagnosis not present

## 2020-12-04 MED ORDER — LORAZEPAM 1 MG PO TABS: 1.0000 mg | ORAL_TABLET | Freq: Three times a day (TID) | ORAL | 5 refills | Status: DC | PRN

## 2020-12-04 NOTE — Assessment & Plan Note (Signed)
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  He remains on chronic doxycycline and prednisone.

## 2020-12-04 NOTE — Assessment & Plan Note (Signed)
Currently receiving palliative chemo is managed by Dr. Julien Nordmann.  He has had some slightly worsening dyspnea.  I did complete a handicap placard application for him today.

## 2020-12-04 NOTE — Progress Notes (Signed)
Richard Li - 60 y.o. 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  He also has history of hypertension, generalized anxiety and hypertriglyceridemia.  He is currently receiving palliative chemotherapy that is managed by Dr. Julien Nordmann.  States he feels pretty good at this time although he does get winded much more easily.  He is on chronic doxycycline and prednisone for recurrent postobstructive pneumonia.  This is managed by his pulmonologist.  He is prescribed Percocet for management of pain however does not use this too often.  He is chronically been taking lorazepam for his anxiety.  He feels like this continues to work fairly well for management of his anxiety symptoms.  He has not noted any significant side effects including oversedation.  He would like to continue this due to his increased anxiety during this time.  ROS:  A comprehensive ROS was completed and negative except as noted per HPI  Allergies  Allergen Reactions   Penicillins     Unknown reaction   Amoxicillin Rash    Past Medical History:  Diagnosis Date   Allergic rhinitis, cause unspecified    Anxiety state, unspecified    Asthma    as a child   Chronic airway obstruction, not elsewhere classified    Esophageal reflux    History of kidney stones    History of radiation therapy 02/13/2020-03/24/2020   right lung        Dr Gery Pray   Hypertension    Lumbago    lung ca dx'd 12/2019   Other and unspecified hyperlipidemia    Other chest pain     Past Surgical History:  Procedure Laterality Date   APPENDECTOMY     BRONCHIAL BIOPSY  01/07/2020   Procedure: BRONCHIAL BIOPSIES;  Surgeon: Collene Gobble, MD;  Location: [facility] ENDOSCOPY;  Service: Pulmonary;;   BRONCHIAL BIOPSY  04/05/2020   Procedure: BRONCHIAL BIOPSIES;  Surgeon: Rigoberto Noel, MD;  Location: [place];  Service: Cardiopulmonary;;   BRONCHIAL BRUSHINGS  01/07/2020   Procedure: BRONCHIAL BRUSHINGS;  Surgeon: Collene Gobble, MD;  Location: [facility] ENDOSCOPY;  Service: Pulmonary;;   BRONCHIAL NEEDLE ASPIRATION BIOPSY  01/07/2020   Procedure: BRONCHIAL NEEDLE ASPIRATION BIOPSIES;  Surgeon: Collene Gobble, MD;  Location[facility] ENDOSCOPY;  Service: Pulmonary;;   BRONCHIAL WASHINGS  01/07/2020   Procedure: BRONCHIAL WASHINGS;  Surgeon: Collene Gobble, MD;  Location: [facility] ENDOSCOPY;  Service: Pulmonary;;   BRONCHIAL WASHINGS  04/05/2020   Procedure: BRONCHIAL WASHINGS;  Surgeon: Rigoberto Noel, MD;  Location: [place];  Service: Cardiopulmonary;;   COLONOSCOPY  15 years ago   HEMOSTASIS CONTROL  01/07/2020   Procedure: HEMOSTASIS CONTROL;  Surgeon: Collene Gobble, MD;  Location: [facility] ENDOSCOPY;  Service: Pulmonary;;  cold saline   IR IMAGING GUIDED PORT INSERTION  09/18/2020   NASAL TURBINATE REDUCTION  2002   Dr.Crossley   VASECTOMY     VIDEO BRONCHOSCOPY Right 04/05/2020   Procedure: VIDEO BRONCHOSCOPY WITH FLUORO;  Surgeon: Rigoberto Noel, MD;  Location: [place];  Service: Cardiopulmonary;  Laterality: Right;   VIDEO BRONCHOSCOPY WITH ENDOBRONCHIAL NAVIGATION N/A 01/07/2020   Procedure: VIDEO BRONCHOSCOPY WITH ENDOBRONCHIAL NAVIGATION;  Surgeon: Collene Gobble, MD;  Location: [place] ENDOSCOPY;  Service: Pulmonary;  Laterality: N/A;   VIDEO BRONCHOSCOPY WITH ENDOBRONCHIAL ULTRASOUND N/A  01/07/2020   Procedure: VIDEO BRONCHOSCOPY WITH ENDOBRONCHIAL ULTRASOUND;  Surgeon: Collene Gobble, MD;  Location: [facility] ENDOSCOPY;  Service: Pulmonary;  Laterality: N/A;    Social History   Socioeconomic History   Marital status: Married    Spouse name: Designer, industrial/product   Number of children: Not on file   Years of education: Not on file   Highest education level: Not on file  Occupational History   Occupation: truck Education administrator: [employer]  Tobacco Use   Smoking  status: Former    Packs/day: 2.00    Years: 28.00    Pack years: 56.00    Types: Cigarettes    Quit date: 04/25/2005    Years since quitting: 15.6   Smokeless tobacco: Never  Vaping Use   Vaping Use: Never used  Substance and Sexual Activity   Alcohol use: Yes    Alcohol/week: 12.0 standard drinks    Types: 12 Cans of beer per week    Comment: social use   Drug use: No   Sexual activity: Not on file  Other Topics Concern   Not on file  Social History Narrative   Not on file   Social Determinants of Health   Financial Resource Strain: Not on file  Food Insecurity: Not on file  Transportation Needs: Not on file  Physical Activity: Not on file  Stress: Not on file  Social Connections: Not on file    Family History  Problem Relation Age of Onset   Breast cancer Mother    Cancer Father    Heart attack Brother    Emphysema Maternal Uncle    COPD Maternal Uncle    Heart disease Maternal Uncle    Colon cancer Neg Hx     Health Maintenance  Topic Date Due   COVID-19 Vaccine (1) Never done   Pneumococcal Vaccine 60-67 Years old (1 - PCV) Never done   Hepatitis C Screening  Never done   Zoster Vaccines- Shingrix (1 of 2) Never done   INFLUENZA VACCINE  01/29/2021 (Originally 11/23/2020)   TETANUS/TDAP  12/11/2021   COLONOSCOPY (Pts 45-39yrs Insurance coverage will need to be confirmed)  04/23/2023   HIV Screening  Completed   HPV VACCINES  Aged Out     ----------------------------------------------------------------------------------------------------------------------------------------------------------------------------------------------------------------- Physical Exam BP 119/75 (BP Location: Left Arm, Patient Position: Sitting, Cuff Size: Normal)   Pulse (!) 108   Wt 196 lb 1.9 oz (89 kg)   SpO2 100%   BMI 28.14 kg/m   Physical Exam Constitutional:      Appearance: Normal appearance.  HENT:     Head: Normocephalic and atraumatic.  Eyes:     General: No  scleral icterus. Cardiovascular:     Rate and Rhythm: Normal rate and regular rhythm.  Pulmonary:     Effort: Pulmonary effort is normal.  Musculoskeletal:     Cervical back: Neck supple.  Neurological:     General: No focal deficit present.     Mental Status: He is alert and oriented to person, place, and time.  Psychiatric:        Mood and Affect: Mood normal.        Behavior: Behavior normal.    ------------------------------------------------------------------------------------------------------------------------------------------------------------------------------------------------------------------- Assessment and Plan  Adenocarcinoma of right lung, stage 4 (HCC) Currently receiving palliative chemo is managed by Dr. Julien Nordmann.  He has had some slightly worsening dyspnea.  I did complete a handicap placard application for him today.  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  [place]) COPD with history of recurrent postobstructive pneumonia.  He remains on chronic doxycycline and prednisone.  GAD (generalized anxiety disorder) This is well controlled with lorazepam 3 times daily as needed.  Denies significant side effects with this.  We will continue at this time.   Meds ordered this encounter  Medications   LORazepam (ATIVAN) 1 MG tablet    Sig: Take 1 tablet (1 mg total) by mouth every 8 (eight) hours as needed for anxiety.    Dispense:  90 tablet    Refill:  5    Return in about 6 months (around 06/06/2021) for GAD.    This visit occurred during the SARS-CoV-2 public health emergency.  Safety protocols were in place, including screening questions prior to the visit, additional usage of staff PPE, and extensive cleaning of exam room while observing appropriate contact time as indicated for disinfecting solutions.

## 2020-12-04 NOTE — Assessment & Plan Note (Signed)
This is well controlled with lorazepam 3 times daily as needed.  Denies significant side effects with this.  We will continue at this time.

## 2020-12-07 ENCOUNTER — Encounter: Payer: Self-pay | Admitting: Internal Medicine

## 2020-12-07 ENCOUNTER — Other Ambulatory Visit: Payer: Self-pay | Admitting: Physician Assistant

## 2020-12-07 ENCOUNTER — Other Ambulatory Visit: Payer: Self-pay | Admitting: Pulmonary Disease

## 2020-12-07 DIAGNOSIS — C3491 Malignant neoplasm of unspecified part of right bronchus or lung: Secondary | ICD-10-CM

## 2020-12-07 NOTE — Progress Notes (Signed)
                                                                                                                                                             Patient Name: Richard Li MRN: [id number] DOB: 17-Aug-1960 Referring Physician: Curt Bears (Profile Not Attached) Date of Service: 10/13/2020 [facility], [place]                                                        End Of Treatment Note  Diagnoses: C79.31-Secondary malignant neoplasm of brain  Cancer Staging: STAGE IV Cancer Staging Adenocarcinoma of right lung, stage 4 ([place] [place] [facility]) Staging form: Lung, AJCC 8th Edition - Clinical: Stage IVA (cT3, cN0, cM1b) - Signed by Curt Bears, MD on 08/28/2020  Intent: Palliative  Radiation Treatment Date:  10/13/2020 Site Technique Total Dose (Gy) Dose per Fx (Gy) Completed Fx Beam Energies  Brain: Brain_SRS IMRT 20/20 20 1/1 [id number]     Narrative: The patient tolerated radiation therapy relatively well.   Plan: The patient will follow-up with radiation oncology in 55mo. -----------------------------------  Eppie Gibson, MD

## 2020-12-08 ENCOUNTER — Other Ambulatory Visit: Payer: Self-pay | Admitting: Internal Medicine

## 2020-12-08 ENCOUNTER — Encounter: Payer: Self-pay | Admitting: Internal Medicine

## 2020-12-08 DIAGNOSIS — C3491 Malignant neoplasm of unspecified part of right bronchus or lung: Secondary | ICD-10-CM

## 2020-12-08 MED ORDER — OXYCODONE-ACETAMINOPHEN 5-325 MG PO TABS
1.0000 | ORAL_TABLET | Freq: Four times a day (QID) | ORAL | 0 refills | Status: DC | PRN
Start: 2020-12-08 — End: 2021-01-07

## 2020-12-10 ENCOUNTER — Ambulatory Visit (HOSPITAL_COMMUNITY)
Admission: RE | Admit: 2020-12-10 | Discharge: 2020-12-10 | Disposition: A | Discharge status: Home / Self Care | Payer: BC Managed Care – PPO | Source: Ambulatory Visit | Attending: Internal Medicine | Admitting: Internal Medicine

## 2020-12-10 ENCOUNTER — Inpatient Hospital Stay: Payer: BC Managed Care – PPO

## 2020-12-10 ENCOUNTER — Other Ambulatory Visit: Payer: Self-pay

## 2020-12-10 ENCOUNTER — Encounter: Payer: Self-pay | Admitting: Internal Medicine

## 2020-12-10 DIAGNOSIS — C3482 Malignant neoplasm of overlapping sites of left bronchus and lung: Secondary | ICD-10-CM | POA: Diagnosis not present

## 2020-12-10 DIAGNOSIS — C3490 Malignant neoplasm of unspecified part of unspecified bronchus or lung: Secondary | ICD-10-CM | POA: Diagnosis not present

## 2020-12-10 DIAGNOSIS — Z95828 Presence of other vascular implants and grafts: Secondary | ICD-10-CM

## 2020-12-10 DIAGNOSIS — C3491 Malignant neoplasm of unspecified part of right bronchus or lung: Secondary | ICD-10-CM

## 2020-12-10 LAB — CMP (CANCER CENTER ONLY)
ALT: 16 U/L (ref 0–44)
AST: 26 U/L (ref 15–41)
Albumin: 3.7 g/dL (ref 3.5–5.0)
Alkaline Phosphatase: 95 U/L (ref 38–126)
Anion gap: 9 (ref 5–15)
BUN: 8 mg/dL (ref 6–20)
CO2: 24 mmol/L (ref 22–32)
Calcium: 9.5 mg/dL (ref 8.9–10.3)
Chloride: 104 mmol/L (ref 98–111)
Creatinine: 0.95 mg/dL (ref 0.61–1.24)
GFR, Estimated: >60 mL/min (ref >60)
Glucose, Bld: 104 mg/dL — ABNORMAL HIGH (ref 70–99)
Potassium: 4.1 mmol/L (ref 3.5–5.1)
Sodium: 137 mmol/L (ref 135–145)
Total Bilirubin: 0.6 mg/dL (ref 0.3–1.2)
Total Protein: 6.9 g/dL (ref 6.5–8.1)

## 2020-12-10 LAB — CBC WITH DIFFERENTIAL (CANCER CENTER ONLY)
Abs Immature Granulocytes: 0.01 10*3/uL (ref 0.00–0.07)
Basophils Absolute: 0 10*3/uL (ref 0.0–0.1)
Basophils Relative: 0 %
Eosinophils Absolute: 0 10*3/uL (ref 0.0–0.5)
Eosinophils Relative: 1 %
HCT: 22.3 % — ABNORMAL LOW (ref 39.0–52.0)
Hemoglobin: 7.8 g/dL — ABNORMAL LOW (ref 13.0–17.0)
Immature Granulocytes: 0 %
Lymphocytes Relative: 9 %
Lymphs Abs: 0.3 10*3/uL — ABNORMAL LOW (ref 0.7–4.0)
MCH: 33.3 pg (ref 26.0–34.0)
MCHC: 35 g/dL (ref 30.0–36.0)
MCV: 95.3 fL (ref 80.0–100.0)
Monocytes Absolute: 0.5 10*3/uL (ref 0.1–1.0)
Monocytes Relative: 16 %
Neutro Abs: 2.5 10*3/uL (ref 1.7–7.7)
Neutrophils Relative %: 74 %
Platelet Count: 113 10*3/uL — ABNORMAL LOW (ref 150–400)
RBC: 2.34 MIL/uL — ABNORMAL LOW (ref 4.22–5.81)
RDW: 19.5 % — ABNORMAL HIGH (ref 11.5–15.5)
WBC Count: 3.3 10*3/uL — ABNORMAL LOW (ref 4.0–10.5)
nRBC: 0 % (ref 0.0–0.2)

## 2020-12-10 IMAGING — CT CT ABD-PELV W/ CM
2 of 5 series · 12 of 36 positions shown, 15 images · IV contrast (OMNIPAQUE)
Comparison: CT chest dated [DATE]; PET-CT dated [DATE]

CLINICAL DATA: Non-small cell lung cancer

EXAM:
CT CHEST, ABDOMEN AND PELVIS WITHOUT CONTRAST
TECHNIQUE: Multidetector CT imaging of the chest, abdomen and pelvis was
performed following the standard protocol without IV contrast.

[Series 2: cap with · axial · 0.87mm/px · z∈[-510,+55]mm · 9 of 143 slices shown, 12 images]
[im 15/143  mediastinal]
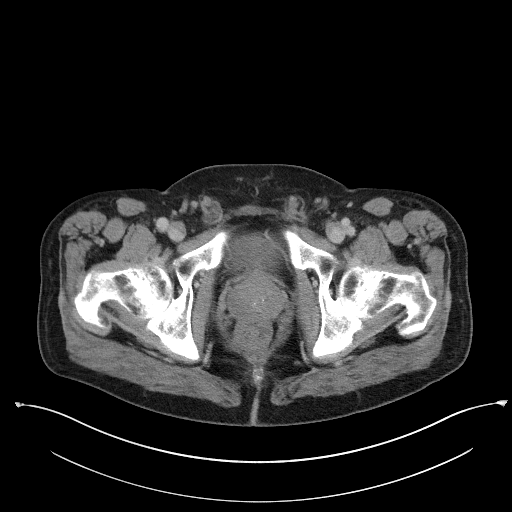
[im 15/143  lung]
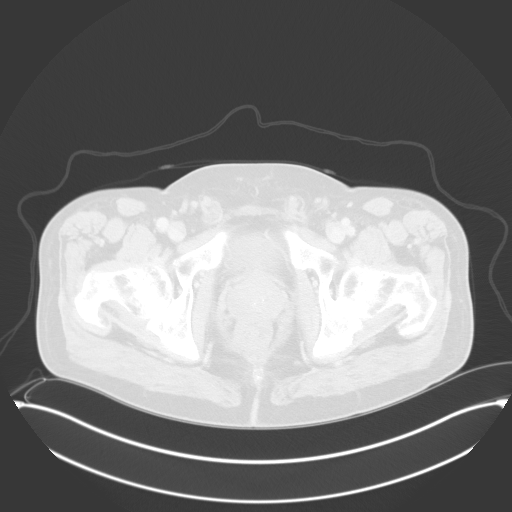
[im 29/143  lung]
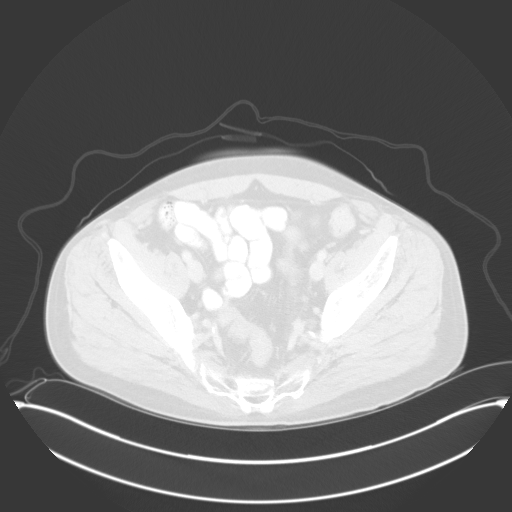
[im 43/143  lung]
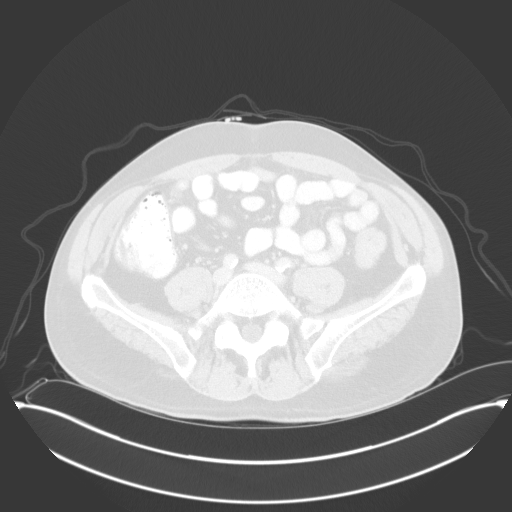
[im 57/143  lung]
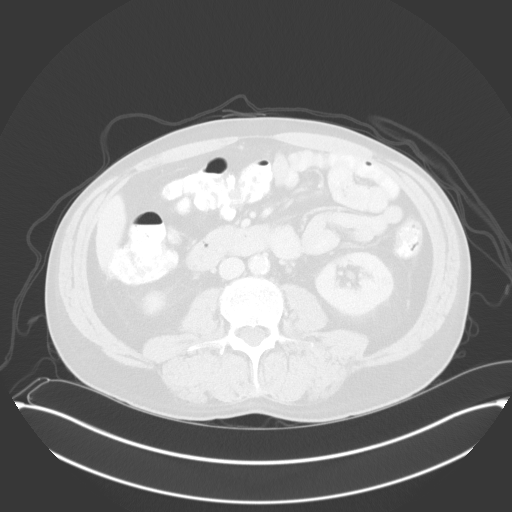
[im 72/143  mediastinal]
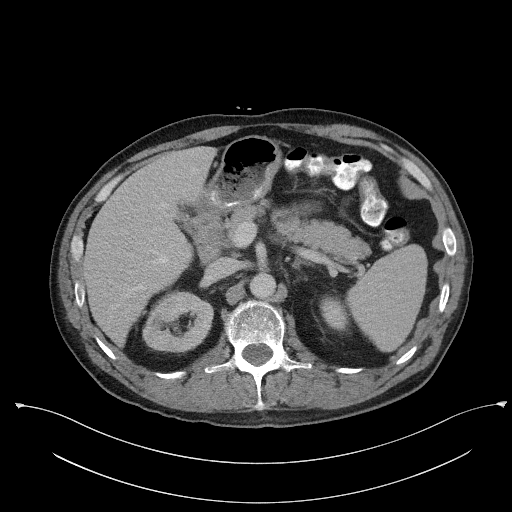
[im 72/143  lung]
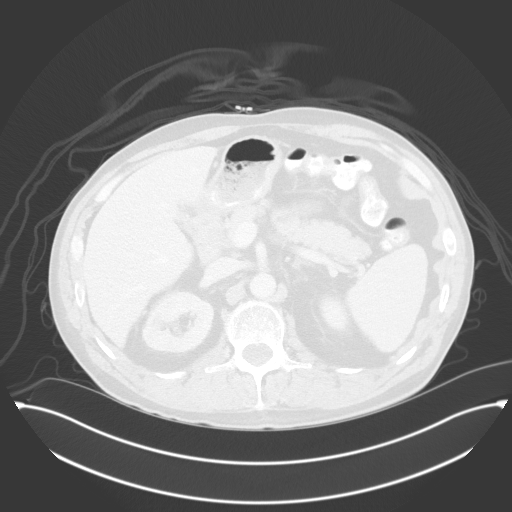
[im 86/143  lung]
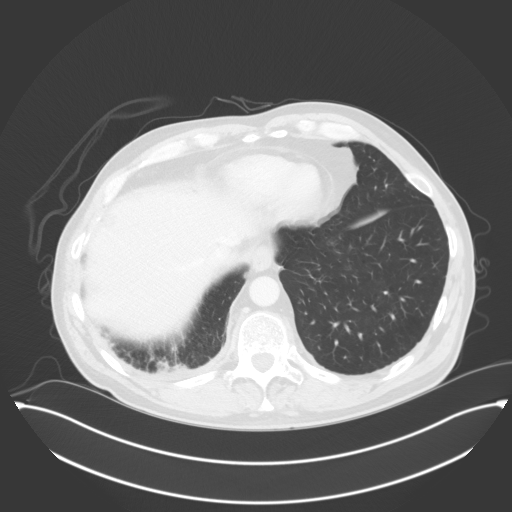
[im 100/143  lung]
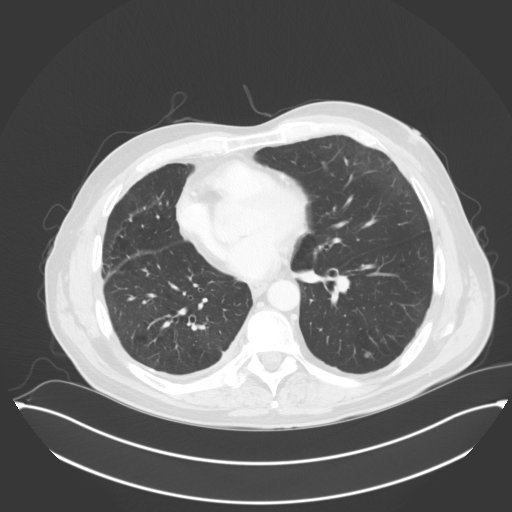
[im 114/143  lung]
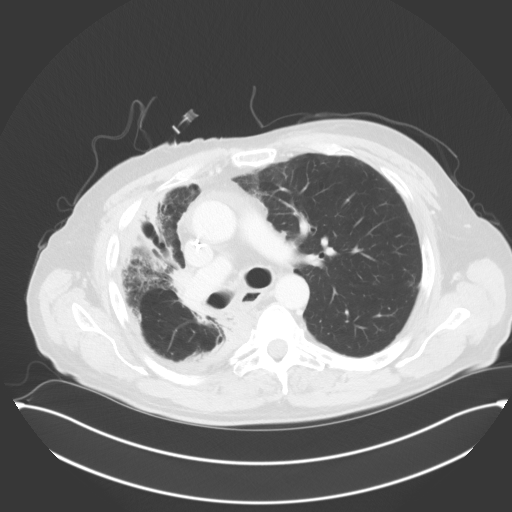
[im 128/143  mediastinal]
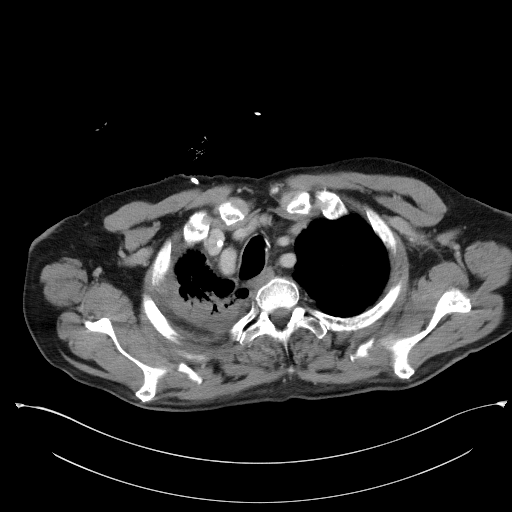
[im 128/143  lung]
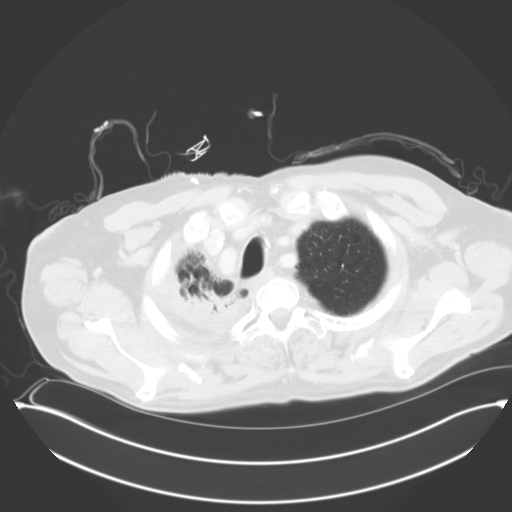

[Series 5: coronals · coronal · 0.84mm/px · 3 of 138 slices shown]
[im 28/138  lung]
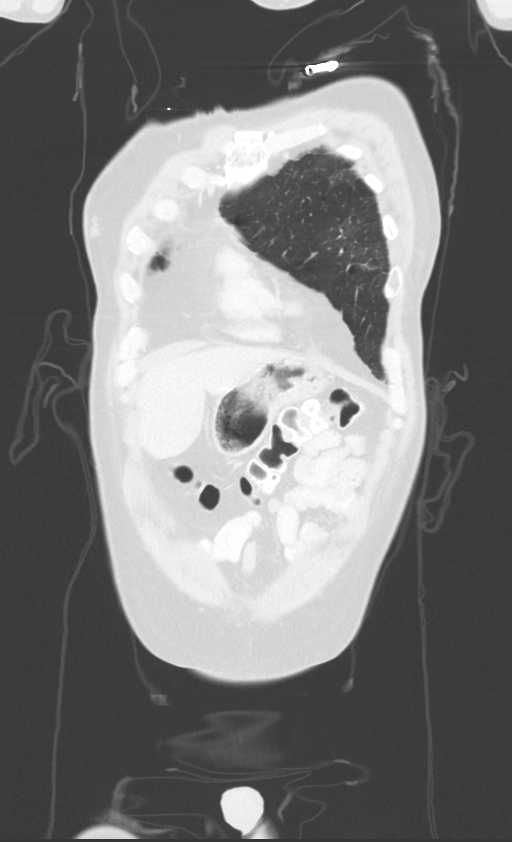
[im 55/138  lung]
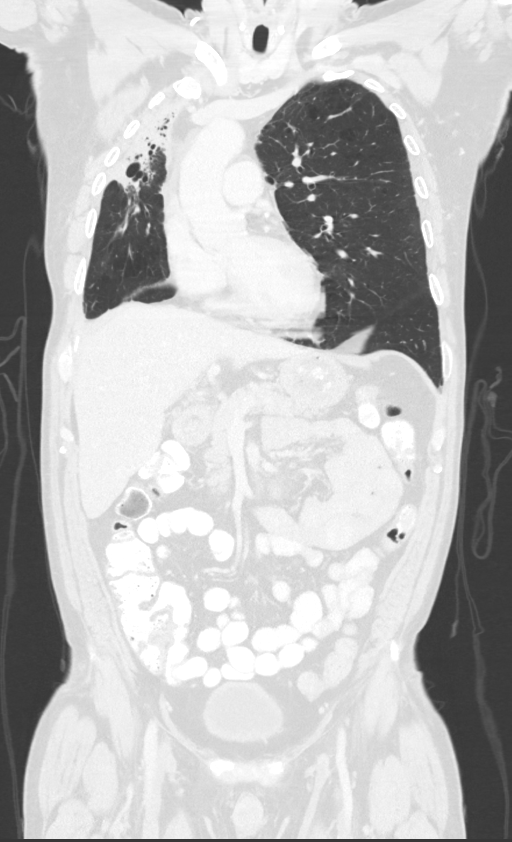
[im 83/138  lung]
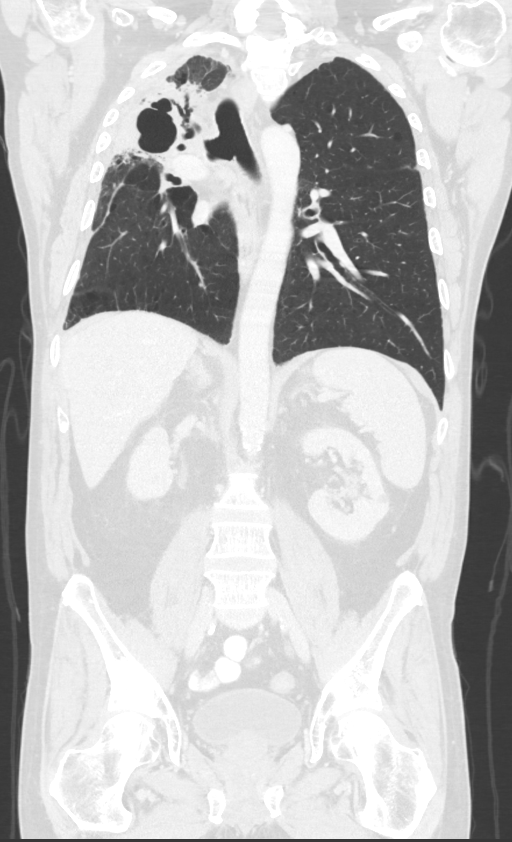

[12 of 36 positions shown; findings below may reference images not displayed]

FINDINGS: CT CHEST FINDINGS

Cardiovascular: Normal heart size. Unchanged small pericardial
effusion. No significant coronary artery calcifications.
Atherosclerotic disease of the thoracic aorta. Right chest wall port
with tip near the superior cavoatrial junction.

Mediastinum/Nodes: No enlarged lymph nodes in the chest. Esophagus
and thyroid are unremarkable.

Lungs/Pleura: Central airways are patent. Upper lobe predominant
centrilobular emphysema. Left upper lobe linear opacities with
associated cystic change and traction bronchiectasis, likely post
radiation change, findings are unchanged compared to prior.
Unchanged right pleural thickening. Previously seen hypermetabolic
pleural nodules are less conspicuous. Reference right lower lobe
subpleural nodule measures 3 mm on series 2, image 45, previously
measured 5 mm. Stable left lower lobe solid pulmonary nodule
measuring 6 mm on series 4, image 106.

Musculoskeletal: Redemonstrated compression deformity of T12. No new
osseous metastatic disease.

CT ABDOMEN PELVIS FINDINGS

Hepatobiliary: Left hepatic lobe metastatic lesion which was seen on
prior PET-CT is not visualized on today's exam. No gallstones,
gallbladder wall thickening, or biliary dilatation.

Pancreas: Unremarkable. No pancreatic ductal dilatation or
surrounding inflammatory changes.

Spleen: Normal in size without focal abnormality.

Adrenals/Urinary Tract: Bilateral adrenal glands are unremarkable.
Lesion of the mid region of the right kidney has decreased in size
compared to prior PET-CT. No hydronephrosis or nephrolithiasis. Mild
bladder wall thickening.

Stomach/Bowel: Stomach is within normal limits. Appendix is not
visualized. No evidence of bowel wall thickening, distention, or
inflammatory changes.

Vascular/Lymphatic: Aortic atherosclerosis. Decreased size of
retroperitoneal lymph nodes. For example, decreased size pericaval
node which was hypermetabolic on prior PET-CT measuring 4 mm
previously 7 mm. No enlarged abdominal or pelvic lymph nodes.

Reproductive: Prostatomegaly.

Other: No abdominal wall hernia or abnormality. No abdominopelvic
ascites.

Musculoskeletal: Unchanged subtle sclerotic lesions of the pelvis
and bilateral ribs. No new osseous metastatic disease.
IMPRESSION: Previously seen hypermetabolic pleural nodules are less conspicuous
compared with prior chest CT dated [DATE].

Left hepatic lobe metastatic lesion which was seen on prior PET-CT
is not visualized today's exam.

Decreased size of right renal lesion when compared with prior
PET-CT.

Decreased size of retroperitoneal lymph nodes when compared with
prior PET-CT.

Unchanged subtle sclerotic lesions which correlate with areas of
previously seen FDG avidity on prior PET-CT. No evidence worsening
osseous metastatic disease.

Aortic Atherosclerosis ([ND]-[ND]) and Emphysema ([ND]-[ND]).

## 2020-12-10 MED ORDER — HEPARIN SOD (PORK) LOCK FLUSH 100 UNIT/ML IV SOLN
INTRAVENOUS | Status: AC
  Filled 2020-12-10: qty 5

## 2020-12-10 MED ORDER — IOHEXOL 350 MG/ML SOLN
80.0000 mL | Freq: Once | INTRAVENOUS | Status: AC | PRN
  Administered 2020-12-10: 80 mL via INTRAVENOUS

## 2020-12-10 MED ORDER — HEPARIN SOD (PORK) LOCK FLUSH 100 UNIT/ML IV SOLN
500.0000 [IU] | Freq: Once | INTRAVENOUS | Status: AC
  Administered 2020-12-10: 500 [IU] via INTRAVENOUS

## 2020-12-10 MED ORDER — SODIUM CHLORIDE 0.9% FLUSH
10.0000 mL | INTRAVENOUS | Status: AC | PRN
  Administered 2020-12-10: 10 mL

## 2020-12-11 ENCOUNTER — Telehealth: Payer: Self-pay

## 2020-12-11 NOTE — Telephone Encounter (Signed)
Anna Genre, RN Case Manager with [facility] wanting to ensure their information was available in the even pt needs assistance with a number of available services, such as PT/OT. Her/their call back number is [phone]. She requested no call back.

## 2020-12-15 ENCOUNTER — Encounter: Payer: Self-pay | Admitting: *Deleted

## 2020-12-15 ENCOUNTER — Other Ambulatory Visit: Payer: Self-pay | Admitting: Physician Assistant

## 2020-12-15 DIAGNOSIS — D649 Anemia, unspecified: Secondary | ICD-10-CM

## 2020-12-15 NOTE — Progress Notes (Signed)
[facility] OFFICE PROGRESS NOTE  [name], DO [place]  [address] [place] [address]  DIAGNOSIS: Metastatic non-small cell lung cancer initially diagnosed as stage IIB (T3, N0, M0) non-small cell lung cancer, adenocarcinoma presented with large central perihilar mass with suspicious groundglass opacity in the right middle lobe and left upper lobe diagnosed in September 2021.  The patient had evidence of metastatic disease to the right kidney, right pleural space, liver, thoracic:/Abdominal lymph nodes, and bones.  There is also a probable left-sided pleural metastasis as well in March 2022/May 2022.    Molecular studies by guardant 360: No actionable mutations.   PDl1: 80%  PRIOR THERAPY: 1) Concurrent chemoradiation with weekly carboplatin for AUC of 2 and paclitaxel 45 mg/M2.  First dose February 10, 2020.  Status post 5  cycles. 2) Palliative radiation to the left anterior rib cage area as well as the mid back area under the care of Dr. Sondra Come. Last treatment on 10/05/20. 3) SRS to the brain (central right cerebellum) lesion completed on 10/13/20.   CURRENT THERAPY:  1) Palliative systemic chemotherapy with carboplatin AUC of 5, Alimta 500 mg per metered squared, Keytruda 200 mg IV every 3 weeks.  First dose expected on 09/24/2020. Status post 4 cycles.  Starting from cycle #5, the patient will begin maintenance Alimta and Keytruda.  INTERVAL HISTORY: Richard Li 60 y.o. male returns to the clinic today for a follow up visit. The patient is feeling fairly well today. He notes last week he felt sluggish/fatigued. He did rest and eat well over th weekend so does feel a little bit better. His Hbg was 8.2 today. He often needs transfusion support. He is scheduled to receive 2 units of blood on Saturday. He is scheduled to start maintenance alimta and keytruda starting from this cycle.  The patient denies any fever, chills, or weight loss.  He has baseline  night sweats approximately 1x per week. He reports dyspnea on exertion and occasional cough and wheezing which is stable has COPD and some component of this may be secondary to his anemia. He has the inhaler for his wheezing. He uses mucinex BID. He used to use robitussin for his cough but he prefers to be able to cough phlegm up so he has not been taking it. He intermittently has rib pain likely due to his metastatic disease for which he previously received palliative radiation which helped significantly.  He takes Percocet if his pain is not controlled by Tylenol and estimates he takes this once every 3 days. He also notes occasional mild pain in his hips bilaterally but states it is mild and resolves with tylenol.  He denies any vomiting but has nausea. He has zofran and compazine which helps.  Denies recent diarrhea or constipation. He has mild headaches every once in awhile but states it resolves with tylenol. The patient recently completed SRS to the small metastatic brain lesion. I am unsure when his next appointment is with neurosurgery or radiation oncology and will reach out to them to ensure he has follow up. He recently had a restaging CT scan performed. The patient is here today for evaluation and to review his scan results before starting cycle #5.   MEDICAL HISTORY: Past Medical History:  Diagnosis Date   Allergic rhinitis, cause unspecified    Anxiety state, unspecified    Asthma    as a child   Chronic airway obstruction, not elsewhere classified  Esophageal reflux    History of kidney stones    History of radiation therapy 02/13/2020-03/24/2020   right lung        Dr Gery Pray   Hypertension    Lumbago    lung ca dx'd 12/2019   Other and unspecified hyperlipidemia    Other chest pain     ALLERGIES:  is allergic to penicillins and amoxicillin.  MEDICATIONS:  Current Outpatient Medications  Medication Sig Dispense Refill   acetaminophen (TYLENOL) 500 MG tablet Take  1,000 mg by mouth 2 (two) times daily.     Brimonidine Tartrate (LUMIFY) 0.025 % SOLN Place 1 drop into both eyes 2 (two) times a week. As needed     COMBIVENT RESPIMAT 20-100 MCG/ACT AERS respimat INHALE 1 TO 2 PUFFS BY MOUTH EVERY 6 HOURS AS NEEDED FOR WHEEZING 4 g 5   dextromethorphan-guaiFENesin (MUCINEX DM) 30-600 MG 12hr tablet Take 1 tablet by mouth 2 (two) times daily.     doxycycline (VIBRA-TABS) 100 MG tablet Take 1 tablet by mouth twice daily 60 tablet 1   feeding supplement (ENSURE ENLIVE / ENSURE PLUS) LIQD Take 237 mLs by mouth 3 (three) times daily between meals. (Patient taking differently: Take 237 mLs by mouth 2 (two) times daily between meals.) 237 mL 12   fenofibrate 160 MG tablet Take 1 tablet by mouth once daily (Patient taking differently: Take 160 mg by mouth daily.) 90 tablet 3   ferrous sulfate 325 (65 FE) MG tablet Take 1 tablet (325 mg total) by mouth daily with breakfast. 30 tablet 0   folic acid (FOLVITE) 1 MG tablet Take 1 tablet by mouth once daily 30 tablet 0   LORazepam (ATIVAN) 1 MG tablet Take 1 tablet (1 mg total) by mouth every 8 (eight) hours as needed for anxiety. 90 tablet 5   Multiple Vitamin (MULTIVITAMIN WITH MINERALS) TABS tablet Take 1 tablet by mouth daily.     ondansetron (ZOFRAN) 8 MG tablet Take 1 tablet (8 mg total) by mouth every 8 (eight) hours as needed for nausea or vomiting. Starting day 3 after chemotherapy 30 tablet 2   oxyCODONE-acetaminophen (PERCOCET/ROXICET) 5-325 MG tablet Take 1 tablet by mouth every 6 (six) hours as needed for severe pain. 30 tablet 0   potassium chloride SA (KLOR-CON) 20 MEQ tablet Take 1 tablet (20 mEq total) by mouth daily. 6 tablet 0   predniSONE (DELTASONE) 10 MG tablet Take 1 tablet (10 mg total) by mouth daily with breakfast. 90 tablet 0   prochlorperazine (COMPAZINE) 10 MG tablet Take 1 tablet (10 mg total) by mouth every 6 (six) hours as needed. 30 tablet 2   lidocaine-prilocaine (EMLA) cream Apply 1  application topically as needed. (Patient not taking: Reported on 12/17/2020) 30 g 2   meclizine (ANTIVERT) 25 MG tablet Take 1 tablet (25 mg total) by mouth every 6 (six) hours as needed for dizziness. (Patient not taking: Reported on 12/17/2020) 60 tablet 0   No current facility-administered medications for this visit.   Facility-Administered Medications Ordered in Other Visits  Medication Dose Route Frequency Provider Last Rate Last Admin   0.9 %  sodium chloride infusion   Intravenous Continuous Curt Bears, MD   Stopped at 03/05/20 1410   0.9 %  sodium chloride infusion   Intravenous Once Curt Bears, MD       heparin lock flush 100 unit/mL  500 Units Intracatheter Once PRN Curt Bears, MD       pembrolizumab [facility]) 200 mg  in sodium chloride 0.9 % 50 mL chemo infusion  200 mg Intravenous Once Curt Bears, MD       PEMEtrexed (ALIMTA) 1,000 mg in sodium chloride 0.9 % 100 mL chemo infusion  500 mg/m2 (Treatment Plan Recorded) Intravenous Once Curt Bears, MD       prochlorperazine (COMPAZINE) tablet 10 mg  10 mg Oral Once Curt Bears, MD       sodium chloride flush (NS) 0.9 % injection 10 mL  10 mL Intracatheter PRN Curt Bears, MD        SURGICAL HISTORY:  Past Surgical History:  Procedure Laterality Date   APPENDECTOMY     BRONCHIAL BIOPSY  01/07/2020   Procedure: BRONCHIAL BIOPSIES;  Surgeon: Collene Gobble, MD;  Location: [facility] ENDOSCOPY;  Service: Pulmonary;;   BRONCHIAL BIOPSY  04/05/2020   Procedure: BRONCHIAL BIOPSIES;  Surgeon: Rigoberto Noel, MD;  Location: [facility];  Service: Cardiopulmonary;;   BRONCHIAL BRUSHINGS  01/07/2020   Procedure: BRONCHIAL BRUSHINGS;  Surgeon: Collene Gobble, MD;  Location: [facility];  Service: Pulmonary;;   BRONCHIAL NEEDLE ASPIRATION BIOPSY  01/07/2020   Procedure: BRONCHIAL NEEDLE ASPIRATION BIOPSIES;  Surgeon: Collene Gobble, MD;  Location[facility] ENDOSCOPY;  Service: Pulmonary;;   BRONCHIAL WASHINGS   01/07/2020   Procedure: BRONCHIAL WASHINGS;  Surgeon: Collene Gobble, MD;  Location[facility] ENDOSCOPY;  Service: Pulmonary;;   BRONCHIAL WASHINGS  04/05/2020   Procedure: BRONCHIAL WASHINGS;  Surgeon: Rigoberto Noel, MD;  Location: [facility];  Service: Cardiopulmonary;;   COLONOSCOPY  15 years ago   HEMOSTASIS CONTROL  01/07/2020   Procedure: HEMOSTASIS CONTROL;  Surgeon: Collene Gobble, MD;  Location: [facility] ENDOSCOPY;  Service: Pulmonary;;  cold saline   IR IMAGING GUIDED PORT INSERTION  09/18/2020   NASAL TURBINATE REDUCTION  2002   Dr.Crossley   VASECTOMY     VIDEO BRONCHOSCOPY Right 04/05/2020   Procedure: VIDEO BRONCHOSCOPY WITH FLUORO;  Surgeon: Rigoberto Noel, MD;  Location: [facility];  Service: Cardiopulmonary;  Laterality: Right;   VIDEO BRONCHOSCOPY WITH ENDOBRONCHIAL NAVIGATION N/A 01/07/2020   Procedure: VIDEO BRONCHOSCOPY WITH ENDOBRONCHIAL NAVIGATION;  Surgeon: Collene Gobble, MD;  Location: [facility] ENDOSCOPY;  Service: Pulmonary;  Laterality: N/A;   VIDEO BRONCHOSCOPY WITH ENDOBRONCHIAL ULTRASOUND N/A 01/07/2020   Procedure: VIDEO BRONCHOSCOPY WITH ENDOBRONCHIAL ULTRASOUND;  Surgeon: Collene Gobble, MD;  Location: [facility] ENDOSCOPY;  Service: Pulmonary;  Laterality: N/A;    REVIEW OF SYSTEMS:   Review of Systems  Constitutional: Positive for fatigue.  Negative for appetite change, chills, fever and unexpected weight change.  HENT: Negative for mouth sores, nosebleeds, sore throat and trouble swallowing.   Eyes: Negative for eye problems and icterus.  Respiratory: Positive for cough and shortness of breath with exertion.  Positive for wheezing.  Negative for hemoptysis Cardiovascular: Negative for chest pain and leg swelling.  Gastrointestinal: Positive for intermittent nausea.. Negative for abdominal pain,  diarrhea, and vomiting.  Genitourinary: Negative for bladder incontinence, difficulty urinating, dysuria, frequency and hematuria.   Musculoskeletal: Positive for mild hip pain  (bilateral) and left anterior chest (improved from prior).  Negative for gait problem, neck pain and neck stiffness.  Skin: Negative for rash and itching.  Neurological: Positive for occasional headaches. Negative for dizziness, extremity weakness, gait problem,  light-headedness and seizures.  Hematological: Negative for adenopathy. Does not bruise/bleed easily.  Psychiatric/Behavioral: Negative for confusion, depression and sleep disturbance. The patient is not nervous/anxious.     PHYSICAL EXAMINATION:  Blood pressure 126/78, pulse [name])  110, temperature 98.5 F (36.9 C), temperature source Oral, resp. rate 18, weight 196 lb 1.6 oz (89 kg), SpO2 100 %.  ECOG PERFORMANCE STATUS: 1  Physical Exam  Constitutional: Oriented to person, place, and time and well-developed, well-nourished, and in no distress.  HENT:  Head: Normocephalic and atraumatic.  Mouth/Throat: Oropharynx is clear and moist. No oropharyngeal exudate.  Eyes: Conjunctivae are normal. Right eye exhibits no discharge. Left eye exhibits no discharge. No scleral icterus.  Neck: Normal range of motion. Neck supple.  Cardiovascular: Normal rate, regular rhythm, normal heart sounds and intact distal pulses.   Pulmonary/Chest: Effort normal and clear to ascultation bilaterally. No respiratory distress. No rales.  Abdominal: Soft. Bowel sounds are normal. Exhibits no distension and no mass. There is no tenderness.  Musculoskeletal: Normal range of motion. Exhibits no edema.  Lymphadenopathy:    No cervical adenopathy.  Neurological: Alert and oriented to person, place, and time. Exhibits normal muscle tone. Gait normal. Coordination normal.  Skin: Skin is warm and dry. No rash noted. Not diaphoretic. No erythema. No pallor.  Psychiatric: Mood, memory and judgment normal.  Vitals reviewed.  LABORATORY DATA: Lab Results  Component Value Date   WBC 3.0 (L) 12/17/2020   HGB 8.2 (L) 12/17/2020   HCT 24.4 (L) 12/17/2020   MCV  100.0 12/17/2020   PLT 154 12/17/2020      Chemistry      Component Value Date/Time   NA 138 12/17/2020 1010   K 3.3 (L) 12/17/2020 1010   CL 107 12/17/2020 1010   CO2 24 12/17/2020 1010   BUN 15 12/17/2020 1010   CREATININE 1.01 12/17/2020 1010   CREATININE 0.67 (L) 06/24/2020 1517      Component Value Date/Time   CALCIUM 9.4 12/17/2020 1010   ALKPHOS 86 12/17/2020 1010   AST 18 12/17/2020 1010   ALT 11 12/17/2020 1010   BILITOT 0.6 12/17/2020 1010       RADIOGRAPHIC STUDIES:  CT Chest W Contrast  Result Date: 12/11/2020 CLINICAL DATA:  Non-small cell lung cancer EXAM: CT CHEST, ABDOMEN AND PELVIS WITHOUT CONTRAST TECHNIQUE: Multidetector CT imaging of the chest, abdomen and pelvis was performed following the standard protocol without IV contrast. COMPARISON:  CT chest dated October 21, 2020; PET-CT dated Sep 10, 2020 FINDINGS: CT CHEST FINDINGS Cardiovascular: Normal heart size. Unchanged small pericardial effusion. No significant coronary artery calcifications. Atherosclerotic disease of the thoracic aorta. Right chest wall port with tip near the superior cavoatrial junction. Mediastinum/Nodes: No enlarged lymph nodes in the chest. Esophagus and thyroid are unremarkable. Lungs/Pleura: Central airways are patent. Upper lobe predominant centrilobular emphysema. Left upper lobe linear opacities with associated cystic change and traction bronchiectasis, likely post radiation change, findings are unchanged compared to prior. Unchanged right pleural thickening. Previously seen hypermetabolic pleural nodules are less conspicuous. Reference right lower lobe subpleural nodule measures 3 mm on series 2, image 45, previously measured 5 mm. Stable left lower lobe solid pulmonary nodule measuring 6 mm on series 4, image 106. Musculoskeletal: Redemonstrated compression deformity of T12. No new osseous metastatic disease. CT ABDOMEN PELVIS FINDINGS Hepatobiliary: Left hepatic lobe metastatic lesion  which was seen on prior PET-CT is not visualized on today's exam. No gallstones, gallbladder wall thickening, or biliary dilatation. Pancreas: Unremarkable. No pancreatic ductal dilatation or surrounding inflammatory changes. Spleen: Normal in size without focal abnormality. Adrenals/Urinary Tract: Bilateral adrenal glands are unremarkable. Lesion of the mid region of the right kidney has decreased in size compared to prior PET-CT. No  hydronephrosis or nephrolithiasis. Mild bladder wall thickening. Stomach/Bowel: Stomach is within normal limits. Appendix is not visualized. No evidence of bowel wall thickening, distention, or inflammatory changes. Vascular/Lymphatic: Aortic atherosclerosis. Decreased size of retroperitoneal lymph nodes. For example, decreased size pericaval node which was hypermetabolic on prior PET-CT measuring 4 mm previously 7 mm. No enlarged abdominal or pelvic lymph nodes. Reproductive: Prostatomegaly. Other: No abdominal wall hernia or abnormality. No abdominopelvic ascites. Musculoskeletal: Unchanged subtle sclerotic lesions of the pelvis and bilateral ribs. No new osseous metastatic disease. IMPRESSION: Previously seen hypermetabolic pleural nodules are less conspicuous compared with prior chest CT dated October 21, 2020. Left hepatic lobe metastatic lesion which was seen on prior PET-CT is not visualized today's exam. Decreased size of right renal lesion when compared with prior PET-CT. Decreased size of retroperitoneal lymph nodes when compared with prior PET-CT. Unchanged subtle sclerotic lesions which correlate with areas of previously seen FDG avidity on prior PET-CT. No evidence worsening osseous metastatic disease. Aortic Atherosclerosis (ICD10-I70.0) and Emphysema (ICD10-J43.9). Electronically Signed   By: Yetta Glassman M.D.   On: 12/11/2020 17:02   CT Abdomen Pelvis W Contrast  Result Date: 12/11/2020 CLINICAL DATA:  Non-small cell lung cancer EXAM: CT CHEST, ABDOMEN AND PELVIS  WITHOUT CONTRAST TECHNIQUE: Multidetector CT imaging of the chest, abdomen and pelvis was performed following the standard protocol without IV contrast. COMPARISON:  CT chest dated October 21, 2020; PET-CT dated Sep 10, 2020 FINDINGS: CT CHEST FINDINGS Cardiovascular: Normal heart size. Unchanged small pericardial effusion. No significant coronary artery calcifications. Atherosclerotic disease of the thoracic aorta. Right chest wall port with tip near the superior cavoatrial junction. Mediastinum/Nodes: No enlarged lymph nodes in the chest. Esophagus and thyroid are unremarkable. Lungs/Pleura: Central airways are patent. Upper lobe predominant centrilobular emphysema. Left upper lobe linear opacities with associated cystic change and traction bronchiectasis, likely post radiation change, findings are unchanged compared to prior. Unchanged right pleural thickening. Previously seen hypermetabolic pleural nodules are less conspicuous. Reference right lower lobe subpleural nodule measures 3 mm on series 2, image 45, previously measured 5 mm. Stable left lower lobe solid pulmonary nodule measuring 6 mm on series 4, image 106. Musculoskeletal: Redemonstrated compression deformity of T12. No new osseous metastatic disease. CT ABDOMEN PELVIS FINDINGS Hepatobiliary: Left hepatic lobe metastatic lesion which was seen on prior PET-CT is not visualized on today's exam. No gallstones, gallbladder wall thickening, or biliary dilatation. Pancreas: Unremarkable. No pancreatic ductal dilatation or surrounding inflammatory changes. Spleen: Normal in size without focal abnormality. Adrenals/Urinary Tract: Bilateral adrenal glands are unremarkable. Lesion of the mid region of the right kidney has decreased in size compared to prior PET-CT. No hydronephrosis or nephrolithiasis. Mild bladder wall thickening. Stomach/Bowel: Stomach is within normal limits. Appendix is not visualized. No evidence of bowel wall thickening, distention, or  inflammatory changes. Vascular/Lymphatic: Aortic atherosclerosis. Decreased size of retroperitoneal lymph nodes. For example, decreased size pericaval node which was hypermetabolic on prior PET-CT measuring 4 mm previously 7 mm. No enlarged abdominal or pelvic lymph nodes. Reproductive: Prostatomegaly. Other: No abdominal wall hernia or abnormality. No abdominopelvic ascites. Musculoskeletal: Unchanged subtle sclerotic lesions of the pelvis and bilateral ribs. No new osseous metastatic disease. IMPRESSION: Previously seen hypermetabolic pleural nodules are less conspicuous compared with prior chest CT dated October 21, 2020. Left hepatic lobe metastatic lesion which was seen on prior PET-CT is not visualized today's exam. Decreased size of right renal lesion when compared with prior PET-CT. Decreased size of retroperitoneal lymph nodes when compared with prior PET-CT. Unchanged  subtle sclerotic lesions which correlate with areas of previously seen FDG avidity on prior PET-CT. No evidence worsening osseous metastatic disease. Aortic Atherosclerosis (ICD10-I70.0) and Emphysema (ICD10-J43.9). Electronically Signed   By: Yetta Glassman M.D.   On: 12/11/2020 17:02     ASSESSMENT/PLAN:  This is a very pleasant 60 year old Caucasian male with metastatic non-small cell lung cancer.  He was initially diagnosed as a stage IIb (T3, N0, M0) non-small cell lung cancer, adenocarcinoma.  He originally presented with a perihilar right upper lobe lung mass and suspicious groundglass opacity in the right middle lobe.  He had evidence of metastatic disease in March/May 2022 with a biopsy-proven metastatic lesion in the right kidney as well as right pleural space, liver, thoracic/abdominal lymph nodes, and bones.  There is also a probable left-sided pleural metastasis.  He also had a small metastatic brain lesion.  The patient's PD-L1 expression is 80%.  He is negative for any actionable mutations by guardant 360.  The patient was  not a good candidate for resection according to Dr. Roxan Hockey.   Therefore, the patient underwent a course of concurrent chemoradiation with weekly carboplatin for an AUC of 2 and paclitaxel 45 mg per metered squared status post 5 cycles.  He experienced some fatigue, mild odynophagia as well as recurrent pneumonia and septicemia.  He also had radiation-induced pneumonitis.  He was admitted to the hospital several times with recurrent pneumonia.   In March 2022, the patient had a biopsy confirmed metastatic lesion to the kidney.    The patient is currently undergoing palliative systemic chemotherapy with carboplatin for an AUC of 5, Alimta 500 mg per metered squared, Keytruda 20 mg IV every 3 weeks.  He is status post 4 cycles and tolerated it fair except for cytopenias.   The patient completed palliative radiation to the painful ribs and mid thoracic metastatic bone lesions under the care of Dr. Sondra Come.  This was completed on 10/05/2020.  The patient also underwent SRS to the metastatic brain lesion under the care of Dr. Isidore Moos which was completed on 10/13/2020. I will reach out to radiation oncology to ensure he has follow up.   The patient was seen with Dr. Julien Nordmann today.  Dr. Julien Nordmann personally and independently reviewed his restaging CT scan and discussed the results with the patient. The scan showed improvement in his disease. Dr. Julien Nordmann recommends Labs are reviewed.  Recommend that he proceed with cycle #5 today which will begin maintenance Alimta and Keytruda.  We will see him back for follow-up visit in 3 weeks for evaluation and repeat blood work.  The patient will continue taking oxycodone for pain control.  His hip pain is mild and he does have metastatic disease the the pelvis. Discussed that if this every became significant, that we can consider palliative radiation. However, it does not sound like that is needed at this time.   He will continue to use zofran and compazine for his  nausea.   We will arrange for him to receive 2 units of blood on Saturday due to his symptomatic anemia. Hopefully, he will not have significant cytopenia's moving forward since he is starting maintenance treatment.   The patient was advised to call immediately if he has any concerning symptoms in the interval. The patient voices understanding of current disease status and treatment options and is in agreement with the current care plan. All questions were answered. The patient knows to call the clinic with any problems, questions or concerns. We can certainly  see the patient much sooner if necessary      Orders Placed This Encounter  Procedures   CBC with Differential ([facility] Only)    Standing Status:   Standing    Number of Occurrences:   30    Standing Expiration Date:   12/17/2021   CMP ([facility] only)    Standing Status:   Standing    Number of Occurrences:   30    Standing Expiration Date:   12/17/2021   TSH    Standing Status:   Standing    Number of Occurrences:   30    Standing Expiration Date:   12/17/2021    Tobe Sos Latrisha Coiro, PA-C 12/17/20  ADDENDUM: Hematology/Oncology Attending: I had a face-to-face encounter with the patient.  I reviewed his record, lab and scan and recommended his care plan.  This is a very pleasant 60 years old white male with metastatic non-small cell lung cancer that was initially diagnosed as unresectable stage IIb (T3, N0, M0) adenocarcinoma in September 2021 status post a course of concurrent chemoradiation but unfortunately the patient had evidence for disease metastasis in May 2022. He is currently undergoing systemic chemotherapy with carboplatin, Alimta and Keytruda status post 5 cycles.  Starting from cycle #5 he is on maintenance treatment with Alimta and Keytruda every 3 weeks.  The patient continues to tolerate this treatment well except for fatigue secondary to chemotherapy-induced anemia. He had repeat CT scan of the  chest, abdomen pelvis performed recently.  I personally and independently reviewed the scans and discussed the results with the patient today. His scan showed further improvement of his disease. I recommended for the patient to continue his current treatment with maintenance Alimta and Keytruda every 3 weeks. For the chemotherapy-induced anemia, will arrange for the patient to receive 2 units of PRBCs transfusion. The patient was advised to call immediately if he has any other concerning symptoms in the interval. The total time spent in the appointment was 35 minutes.  Disclaimer: This note was dictated with voice recognition software. Similar sounding words can inadvertently be transcribed and may be missed upon review. Eilleen Kempf, MD 12/18/20

## 2020-12-17 ENCOUNTER — Other Ambulatory Visit: Payer: Self-pay | Admitting: Medical Oncology

## 2020-12-17 ENCOUNTER — Inpatient Hospital Stay: Payer: BC Managed Care – PPO

## 2020-12-17 ENCOUNTER — Inpatient Hospital Stay (HOSPITAL_BASED_OUTPATIENT_CLINIC_OR_DEPARTMENT_OTHER): Payer: BC Managed Care – PPO | Admitting: Physician Assistant

## 2020-12-17 ENCOUNTER — Encounter: Payer: Self-pay | Admitting: Physician Assistant

## 2020-12-17 ENCOUNTER — Other Ambulatory Visit: Payer: Self-pay

## 2020-12-17 VITALS — BP 126/78 | HR 110 | Temp 98.5°F | Resp 18 | Wt 196.1 lb

## 2020-12-17 VITALS — HR 100

## 2020-12-17 DIAGNOSIS — C3491 Malignant neoplasm of unspecified part of right bronchus or lung: Secondary | ICD-10-CM

## 2020-12-17 DIAGNOSIS — Z5112 Encounter for antineoplastic immunotherapy: Secondary | ICD-10-CM

## 2020-12-17 DIAGNOSIS — C3482 Malignant neoplasm of overlapping sites of left bronchus and lung: Secondary | ICD-10-CM | POA: Diagnosis not present

## 2020-12-17 DIAGNOSIS — Z5111 Encounter for antineoplastic chemotherapy: Secondary | ICD-10-CM

## 2020-12-17 DIAGNOSIS — D649 Anemia, unspecified: Secondary | ICD-10-CM | POA: Diagnosis not present

## 2020-12-17 DIAGNOSIS — Z95828 Presence of other vascular implants and grafts: Secondary | ICD-10-CM

## 2020-12-17 DIAGNOSIS — C3490 Malignant neoplasm of unspecified part of unspecified bronchus or lung: Secondary | ICD-10-CM

## 2020-12-17 LAB — CBC WITH DIFFERENTIAL (CANCER CENTER ONLY)
Abs Immature Granulocytes: 0.03 10*3/uL (ref 0.00–0.07)
Basophils Absolute: 0 10*3/uL (ref 0.0–0.1)
Basophils Relative: 0 %
Eosinophils Absolute: 0 10*3/uL (ref 0.0–0.5)
Eosinophils Relative: 1 %
HCT: 24.4 % — ABNORMAL LOW (ref 39.0–52.0)
Hemoglobin: 8.2 g/dL — ABNORMAL LOW (ref 13.0–17.0)
Immature Granulocytes: 1 %
Lymphocytes Relative: 11 %
Lymphs Abs: 0.3 10*3/uL — ABNORMAL LOW (ref 0.7–4.0)
MCH: 33.6 pg (ref 26.0–34.0)
MCHC: 33.6 g/dL (ref 30.0–36.0)
MCV: 100 fL (ref 80.0–100.0)
Monocytes Absolute: 0.7 10*3/uL (ref 0.1–1.0)
Monocytes Relative: 23 %
Neutro Abs: 1.9 10*3/uL (ref 1.7–7.7)
Neutrophils Relative %: 64 %
Platelet Count: 154 10*3/uL (ref 150–400)
RBC: 2.44 MIL/uL — ABNORMAL LOW (ref 4.22–5.81)
RDW: 20.2 % — ABNORMAL HIGH (ref 11.5–15.5)
WBC Count: 3 10*3/uL — ABNORMAL LOW (ref 4.0–10.5)
nRBC: 0 % (ref 0.0–0.2)

## 2020-12-17 LAB — CMP (CANCER CENTER ONLY)
ALT: 11 U/L (ref 0–44)
AST: 18 U/L (ref 15–41)
Albumin: 3.6 g/dL (ref 3.5–5.0)
Alkaline Phosphatase: 86 U/L (ref 38–126)
Anion gap: 7 (ref 5–15)
BUN: 15 mg/dL (ref 6–20)
CO2: 24 mmol/L (ref 22–32)
Calcium: 9.4 mg/dL (ref 8.9–10.3)
Chloride: 107 mmol/L (ref 98–111)
Creatinine: 1.01 mg/dL (ref 0.61–1.24)
GFR, Estimated: >60 mL/min (ref >60)
Glucose, Bld: 108 mg/dL — ABNORMAL HIGH (ref 70–99)
Potassium: 3.3 mmol/L — ABNORMAL LOW (ref 3.5–5.1)
Sodium: 138 mmol/L (ref 135–145)
Total Bilirubin: 0.6 mg/dL (ref 0.3–1.2)
Total Protein: 6.7 g/dL (ref 6.5–8.1)

## 2020-12-17 LAB — SAMPLE TO BLOOD BANK

## 2020-12-17 LAB — PREPARE RBC (CROSSMATCH)

## 2020-12-17 MED ORDER — SODIUM CHLORIDE 0.9 % IV SOLN
500.0000 mg/m2 | Freq: Once | INTRAVENOUS | Status: AC
  Administered 2020-12-17: 1000 mg via INTRAVENOUS
  Filled 2020-12-17: qty 40

## 2020-12-17 MED ORDER — SODIUM CHLORIDE 0.9% FLUSH: 10.0000 mL | Freq: Once | INTRAVENOUS | Status: DC

## 2020-12-17 MED ORDER — SODIUM CHLORIDE 0.9% FLUSH
10.0000 mL | INTRAVENOUS | Status: DC | PRN
  Administered 2020-12-17: 10 mL

## 2020-12-17 MED ORDER — SODIUM CHLORIDE 0.9 % IV SOLN
Freq: Once | INTRAVENOUS | Status: AC
Start: 2020-12-17 — End: 2020-12-17

## 2020-12-17 MED ORDER — PROCHLORPERAZINE MALEATE 10 MG PO TABS
10.0000 mg | ORAL_TABLET | Freq: Once | ORAL | Status: AC
  Administered 2020-12-17: 10 mg via ORAL
  Filled 2020-12-17: qty 1

## 2020-12-17 MED ORDER — SODIUM CHLORIDE 0.9 % IV SOLN
200.0000 mg | Freq: Once | INTRAVENOUS | Status: AC
  Administered 2020-12-17: 200 mg via INTRAVENOUS
  Filled 2020-12-17: qty 8

## 2020-12-17 MED ORDER — SODIUM CHLORIDE 0.9 % IV SOLN: Freq: Once | INTRAVENOUS | Status: DC

## 2020-12-17 MED ORDER — HEPARIN SOD (PORK) LOCK FLUSH 100 UNIT/ML IV SOLN
500.0000 [IU] | Freq: Once | INTRAVENOUS | Status: AC | PRN
  Administered 2020-12-17: 500 [IU]

## 2020-12-17 NOTE — Patient Instructions (Signed)
[facility]  Discharge Instructions: Thank you for choosing [facility] to provide your oncology and hematology care.   If you have a lab appointment with the [facility], please go directly to the [facility] and check in at the registration area.   Wear comfortable clothing and clothing appropriate for easy access to any Portacath or PICC line.   We strive to give you quality time with your provider. You may need to reschedule your appointment if you arrive late (15 or more minutes).  Arriving late affects you and other patients whose appointments are after yours.  Also, if you miss three or more appointments without notifying the office, you may be dismissed from the clinic at the provider's discretion.      For prescription refill requests, have your pharmacy contact our office and allow 72 hours for refills to be completed.    Today you received the following chemotherapy and/or immunotherapy agents: Keytruda, Alimta   To help prevent nausea and vomiting after your treatment, we encourage you to take your nausea medication as directed.  BELOW ARE SYMPTOMS THAT SHOULD BE REPORTED IMMEDIATELY: *FEVER GREATER THAN 100.4 F (38 C) OR HIGHER *CHILLS OR SWEATING *NAUSEA AND VOMITING THAT IS NOT CONTROLLED WITH YOUR NAUSEA MEDICATION *UNUSUAL SHORTNESS OF BREATH *UNUSUAL BRUISING OR BLEEDING *URINARY PROBLEMS (pain or burning when urinating, or frequent urination) *BOWEL PROBLEMS (unusual diarrhea, constipation, pain near the anus) TENDERNESS IN MOUTH AND THROAT WITH OR WITHOUT PRESENCE OF ULCERS (sore throat, sores in mouth, or a toothache) UNUSUAL RASH, SWELLING OR PAIN  UNUSUAL VAGINAL DISCHARGE OR ITCHING   Items with * indicate a potential emergency and should be followed up as soon as possible or go to the Emergency Department if any problems should occur.  Please show the CHEMOTHERAPY ALERT CARD or IMMUNOTHERAPY ALERT CARD at check-in  to the Emergency Department and triage nurse.  Should you have questions after your visit or need to cancel or reschedule your appointment, please contact [facility]  Dept: [phone]  and follow the prompts.  Office hours are 8:00 a.m. to 4:30 p.m. Monday - Friday. Please note that voicemails left after 4:00 p.m. may not be returned until the following business day.  We are closed weekends and major holidays. You have access to a nurse at all times for urgent questions. Please call the main number to the clinic Dept: [phone] and follow the prompts.   For any non-urgent questions, you may also contact your provider using MyChart. We now offer e-Visits for anyone 44 and older to request care online for non-urgent symptoms. For details visit [email or web address].   Also download the MyChart app! Go to the app store, search "MyChart", open the app, select [facility], and log in with your MyChart username and password.  Due to Covid, a mask is required upon entering the hospital/clinic. If you do not have a mask, one will be given to you upon arrival. For doctor visits, patients may have 1 support person aged 58 or older with them. For treatment visits, patients cannot have anyone with them due to current Covid guidelines and our immunocompromised population.

## 2020-12-18 ENCOUNTER — Other Ambulatory Visit: Payer: Self-pay | Admitting: Radiation Therapy

## 2020-12-18 ENCOUNTER — Encounter: Payer: Self-pay | Admitting: Internal Medicine

## 2020-12-18 DIAGNOSIS — C7931 Secondary malignant neoplasm of brain: Secondary | ICD-10-CM

## 2020-12-19 ENCOUNTER — Other Ambulatory Visit: Payer: Self-pay

## 2020-12-19 ENCOUNTER — Inpatient Hospital Stay: Payer: BC Managed Care – PPO

## 2020-12-19 DIAGNOSIS — C3482 Malignant neoplasm of overlapping sites of left bronchus and lung: Secondary | ICD-10-CM | POA: Diagnosis not present

## 2020-12-19 DIAGNOSIS — D649 Anemia, unspecified: Secondary | ICD-10-CM

## 2020-12-19 DIAGNOSIS — C3491 Malignant neoplasm of unspecified part of right bronchus or lung: Secondary | ICD-10-CM

## 2020-12-19 MED ORDER — ACETAMINOPHEN 325 MG PO TABS
650.0000 mg | ORAL_TABLET | Freq: Once | ORAL | Status: AC
  Administered 2020-12-19: 650 mg via ORAL

## 2020-12-19 MED ORDER — SODIUM CHLORIDE 0.9% FLUSH
10.0000 mL | INTRAVENOUS | Status: AC | PRN
  Administered 2020-12-19: 10 mL

## 2020-12-19 MED ORDER — DIPHENHYDRAMINE HCL 25 MG PO CAPS
25.0000 mg | ORAL_CAPSULE | Freq: Once | ORAL | Status: AC
  Administered 2020-12-19: 25 mg via ORAL

## 2020-12-19 MED ORDER — DIPHENHYDRAMINE HCL 25 MG PO CAPS
ORAL_CAPSULE | ORAL | Status: AC
  Filled 2020-12-19: qty 1

## 2020-12-19 MED ORDER — SODIUM CHLORIDE 0.9% IV SOLUTION
250.0000 mL | Freq: Once | INTRAVENOUS | Status: AC
  Administered 2020-12-19: 250 mL via INTRAVENOUS

## 2020-12-19 MED ORDER — ACETAMINOPHEN 325 MG PO TABS
ORAL_TABLET | ORAL | Status: AC
  Filled 2020-12-19: qty 2

## 2020-12-19 MED ORDER — HEPARIN SOD (PORK) LOCK FLUSH 100 UNIT/ML IV SOLN: 250.0000 [IU] | INTRAVENOUS | Status: DC | PRN

## 2020-12-19 MED ORDER — HEPARIN SOD (PORK) LOCK FLUSH 100 UNIT/ML IV SOLN
500.0000 [IU] | Freq: Every day | INTRAVENOUS | Status: AC | PRN
  Administered 2020-12-19: 500 [IU]

## 2020-12-19 NOTE — Patient Instructions (Signed)
https://www.[facility].org/donate-blood/blood-donation-process/what-happens-to-donated-blood/blood-transfusions/types-of-blood-transfusions.html"> https://www.[facility].org/donate-blood/blood-donation-process/what-happens-to-donated-blood/blood-transfusions/risks-complications.html">  Blood Transfusion, Adult, Care After This sheet gives you information about how to care for yourself after your procedure. Your health care provider may also give you more specific instructions. If you have problems or questions, contact your health careprovider. What can I expect after the procedure? After the procedure, it is common to have: Bruising and soreness where the IV was inserted. A fever or chills on the day of the procedure. This may be your body's response to the new blood cells received. A headache. Follow these instructions at home: IV insertion site care     Follow instructions from your health care provider about how to take care of your IV insertion site. Make sure you: Wash your hands with soap and water before and after you change your bandage (dressing). If soap and water are not available, use hand sanitizer. Change your dressing as told by your health care provider. Check your IV insertion site every day for signs of infection. Check for: Redness, swelling, or pain. Bleeding from the site. Warmth. Pus or a bad smell. General instructions Take over-the-counter and prescription medicines only as told by your health care provider. Rest as told by your health care provider. Return to your normal activities as told by your health care provider. Keep all follow-up visits as told by your health care provider. This is important. Contact a health care provider if: You have itching or red, swollen areas of skin (hives). You feel anxious. You feel weak after doing your normal activities. You have redness, swelling, warmth, or pain around the IV insertion site. You have blood coming  from the IV insertion site that does not stop with pressure. You have pus or a bad smell coming from your IV insertion site. Get help right away if: You have symptoms of a serious allergic or immune system reaction, including: Trouble breathing or shortness of breath. Swelling of the face or feeling flushed. Fever or chills. Pain in the head, back, or chest. Dark urine or blood in the urine. Widespread rash. Fast heartbeat. Feeling dizzy or light-headed. If you receive your blood transfusion in an outpatient setting, you will betold whom to contact to report any reactions. These symptoms may represent a serious problem that is an emergency. Do not wait to see if the symptoms will go away. Get medical help right away. Call your local emergency services (911 in the [place] Do not drive yourself to the hospital. Summary Bruising and tenderness around the IV insertion site are common. Check your IV insertion site every day for signs of infection. Rest as told by your health care provider. Return to your normal activities as told by your health care provider. Get help right away for symptoms of a serious allergic or immune system reaction to blood transfusion. This information is not intended to replace advice given to you by your health care provider. Make sure you discuss any questions you have with your healthcare provider. Document Revised: 10/04/2018 Document Reviewed: 10/04/2018 [employer] Patient Education  2022 [employer]  

## 2020-12-21 LAB — TYPE AND SCREEN
ABO/RH(D): O POS
Antibody Screen: NEGATIVE
Unit division: 0
Unit division: 0

## 2020-12-21 LAB — BPAM RBC
Blood Product Expiration Date: 202209272359
Blood Product Expiration Date: 202209272359
ISSUE DATE / TIME: 202208270829
ISSUE DATE / TIME: 202208270829
Unit Type and Rh: 5100
Unit Type and Rh: 5100

## 2020-12-24 ENCOUNTER — Inpatient Hospital Stay: Payer: BC Managed Care – PPO

## 2020-12-25 ENCOUNTER — Encounter: Payer: Self-pay | Admitting: Internal Medicine

## 2020-12-31 ENCOUNTER — Inpatient Hospital Stay: Payer: BC Managed Care – PPO

## 2021-01-01 ENCOUNTER — Encounter: Payer: Self-pay | Admitting: Internal Medicine

## 2021-01-07 ENCOUNTER — Inpatient Hospital Stay: Payer: BC Managed Care – PPO

## 2021-01-07 ENCOUNTER — Telehealth: Payer: Self-pay | Admitting: Internal Medicine

## 2021-01-07 ENCOUNTER — Inpatient Hospital Stay: Payer: BC Managed Care – PPO | Attending: Internal Medicine | Admitting: Internal Medicine

## 2021-01-07 ENCOUNTER — Other Ambulatory Visit: Payer: Self-pay

## 2021-01-07 ENCOUNTER — Encounter: Payer: Self-pay | Admitting: Internal Medicine

## 2021-01-07 VITALS — BP 117/68 | HR 99 | Temp 97.7°F | Resp 19 | Ht 70.0 in | Wt 196.5 lb

## 2021-01-07 DIAGNOSIS — Z9221 Personal history of antineoplastic chemotherapy: Secondary | ICD-10-CM | POA: Diagnosis not present

## 2021-01-07 DIAGNOSIS — C787 Secondary malignant neoplasm of liver and intrahepatic bile duct: Secondary | ICD-10-CM | POA: Diagnosis not present

## 2021-01-07 DIAGNOSIS — Z923 Personal history of irradiation: Secondary | ICD-10-CM | POA: Diagnosis not present

## 2021-01-07 DIAGNOSIS — C3482 Malignant neoplasm of overlapping sites of left bronchus and lung: Secondary | ICD-10-CM | POA: Insufficient documentation

## 2021-01-07 DIAGNOSIS — Z5112 Encounter for antineoplastic immunotherapy: Secondary | ICD-10-CM | POA: Insufficient documentation

## 2021-01-07 DIAGNOSIS — Z5111 Encounter for antineoplastic chemotherapy: Secondary | ICD-10-CM | POA: Diagnosis present

## 2021-01-07 DIAGNOSIS — C3491 Malignant neoplasm of unspecified part of right bronchus or lung: Secondary | ICD-10-CM

## 2021-01-07 DIAGNOSIS — Z7952 Long term (current) use of systemic steroids: Secondary | ICD-10-CM | POA: Insufficient documentation

## 2021-01-07 DIAGNOSIS — D649 Anemia, unspecified: Secondary | ICD-10-CM

## 2021-01-07 DIAGNOSIS — Z79899 Other long term (current) drug therapy: Secondary | ICD-10-CM | POA: Diagnosis not present

## 2021-01-07 DIAGNOSIS — E785 Hyperlipidemia, unspecified: Secondary | ICD-10-CM | POA: Diagnosis not present

## 2021-01-07 DIAGNOSIS — Z95828 Presence of other vascular implants and grafts: Secondary | ICD-10-CM

## 2021-01-07 DIAGNOSIS — I10 Essential (primary) hypertension: Secondary | ICD-10-CM | POA: Diagnosis not present

## 2021-01-07 LAB — CMP (CANCER CENTER ONLY)
ALT: 11 U/L (ref 0–44)
AST: 23 U/L (ref 15–41)
Albumin: 3.3 g/dL — ABNORMAL LOW (ref 3.5–5.0)
Alkaline Phosphatase: 89 U/L (ref 38–126)
Anion gap: 8 (ref 5–15)
BUN: 10 mg/dL (ref 6–20)
CO2: 26 mmol/L (ref 22–32)
Calcium: 9.3 mg/dL (ref 8.9–10.3)
Chloride: 106 mmol/L (ref 98–111)
Creatinine: 0.87 mg/dL (ref 0.61–1.24)
GFR, Estimated: >60 mL/min (ref >60)
Glucose, Bld: 86 mg/dL (ref 70–99)
Potassium: 3.3 mmol/L — ABNORMAL LOW (ref 3.5–5.1)
Sodium: 140 mmol/L (ref 135–145)
Total Bilirubin: 0.4 mg/dL (ref 0.3–1.2)
Total Protein: 6.6 g/dL (ref 6.5–8.1)

## 2021-01-07 LAB — CBC WITH DIFFERENTIAL (CANCER CENTER ONLY)
Abs Immature Granulocytes: 0.04 10*3/uL (ref 0.00–0.07)
Basophils Absolute: 0 10*3/uL (ref 0.0–0.1)
Basophils Relative: 1 %
Eosinophils Absolute: 0.1 10*3/uL (ref 0.0–0.5)
Eosinophils Relative: 1 %
HCT: 29.7 % — ABNORMAL LOW (ref 39.0–52.0)
Hemoglobin: 10 g/dL — ABNORMAL LOW (ref 13.0–17.0)
Immature Granulocytes: 1 %
Lymphocytes Relative: 7 %
Lymphs Abs: 0.5 10*3/uL — ABNORMAL LOW (ref 0.7–4.0)
MCH: 31.7 pg (ref 26.0–34.0)
MCHC: 33.7 g/dL (ref 30.0–36.0)
MCV: 94.3 fL (ref 80.0–100.0)
Monocytes Absolute: 1.3 10*3/uL — ABNORMAL HIGH (ref 0.1–1.0)
Monocytes Relative: 19 %
Neutro Abs: 4.8 10*3/uL (ref 1.7–7.7)
Neutrophils Relative %: 71 %
Platelet Count: 245 10*3/uL (ref 150–400)
RBC: 3.15 MIL/uL — ABNORMAL LOW (ref 4.22–5.81)
RDW: 18.9 % — ABNORMAL HIGH (ref 11.5–15.5)
WBC Count: 6.6 10*3/uL (ref 4.0–10.5)
nRBC: 0 % (ref 0.0–0.2)

## 2021-01-07 LAB — TSH: TSH: 1.001 u[IU]/mL (ref 0.320–4.118)

## 2021-01-07 MED ORDER — HEPARIN SOD (PORK) LOCK FLUSH 100 UNIT/ML IV SOLN
500.0000 [IU] | Freq: Once | INTRAVENOUS | Status: AC | PRN
  Administered 2021-01-07: 500 [IU]

## 2021-01-07 MED ORDER — SODIUM CHLORIDE 0.9% FLUSH
10.0000 mL | Freq: Once | INTRAVENOUS | Status: AC
  Administered 2021-01-07: 10 mL via INTRAVENOUS

## 2021-01-07 MED ORDER — OXYCODONE-ACETAMINOPHEN 5-325 MG PO TABS: 1.0000 | ORAL_TABLET | Freq: Four times a day (QID) | ORAL | 0 refills | Status: DC | PRN

## 2021-01-07 MED ORDER — CYANOCOBALAMIN 1000 MCG/ML IJ SOLN
1000.0000 ug | Freq: Once | INTRAMUSCULAR | Status: AC
  Administered 2021-01-07: 1000 ug via INTRAMUSCULAR

## 2021-01-07 MED ORDER — SODIUM CHLORIDE 0.9% FLUSH
10.0000 mL | INTRAVENOUS | Status: DC | PRN
  Administered 2021-01-07: 10 mL

## 2021-01-07 MED ORDER — SODIUM CHLORIDE 0.9 % IV SOLN
500.0000 mg/m2 | Freq: Once | INTRAVENOUS | Status: AC
  Administered 2021-01-07: 1000 mg via INTRAVENOUS
  Filled 2021-01-07: qty 40

## 2021-01-07 MED ORDER — SODIUM CHLORIDE 0.9 % IV SOLN
200.0000 mg | Freq: Once | INTRAVENOUS | Status: AC
  Administered 2021-01-07: 200 mg via INTRAVENOUS
  Filled 2021-01-07: qty 8

## 2021-01-07 MED ORDER — PROCHLORPERAZINE MALEATE 10 MG PO TABS
ORAL_TABLET | ORAL | Status: AC
  Filled 2021-01-07: qty 1

## 2021-01-07 MED ORDER — CYANOCOBALAMIN 1000 MCG/ML IJ SOLN
INTRAMUSCULAR | Status: AC
  Filled 2021-01-07: qty 1

## 2021-01-07 MED ORDER — SODIUM CHLORIDE 0.9 % IV SOLN: Freq: Once | INTRAVENOUS | Status: AC

## 2021-01-07 MED ORDER — PROCHLORPERAZINE MALEATE 10 MG PO TABS
10.0000 mg | ORAL_TABLET | Freq: Once | ORAL | Status: AC
  Administered 2021-01-07: 10 mg via ORAL

## 2021-01-07 NOTE — Progress Notes (Signed)
[facility] [phone]   [phone]  OFFICE PROGRESS NOTE  [name], DO [address]  [address] [place] [address]  DIAGNOSIS: Metastatic non-small cell lung cancer initially diagnosed as stage IIB (T3, N0, M0) non-small cell lung cancer, adenocarcinoma presented with large central perihilar mass with suspicious groundglass opacity in the right middle lobe and left upper lobe diagnosed in September 2021.  The patient had evidence of metastatic disease to the right kidney in March 2022.  Molecular studies by guardant 360: No actionable mutations.   PDl1: 80%   PRIOR THERAPY: Concurrent chemoradiation with weekly carboplatin for AUC of 2 and paclitaxel 45 mg/M2.  First dose February 10, 2020.  Status post 5 cycles.   CURRENT THERAPY:  1) Palliative systemic chemotherapy with carboplatin AUC of 5, Alimta 500 mg per metered squared, Keytruda 200 mg IV every 3 weeks.  First dose expected on 09/24/2020. Status post 5 cycles.  Starting from cycle #5 the patient is on maintenance treatment with Alimta and Keytruda every 3 weeks. 2) Palliative radiation to the left anterior rib cage area as well as the mid back area under the care of Dr. Sondra Come. Last treatment on 10/05/20.  INTERVAL HISTORY: Richard Li 60 y.o. male returns to the clinic today for follow-up visit.  The patient is feeling fine today with no concerning complaints except for intermittent pain at the lower rib cage and more on the left side.  He denied having any shortness of breath, cough or hemoptysis.  He denied having any fever or chills.  He has no nausea, vomiting, diarrhea or constipation.  He has no headache or visual changes.  He denied having any significant weight loss or night sweats.  He is here today for evaluation before starting cycle #6 of his treatment.   MEDICAL HISTORY: Past Medical History:  Diagnosis Date   Allergic rhinitis, cause unspecified     Anxiety state, unspecified    Asthma    as a child   Chronic airway obstruction, not elsewhere classified    Esophageal reflux    History of kidney stones    History of radiation therapy 02/13/2020-03/24/2020   right lung        Dr Gery Pray   Hypertension    Lumbago    lung ca dx'd 12/2019   Other and unspecified hyperlipidemia    Other chest pain     ALLERGIES:  is allergic to penicillins and amoxicillin.  MEDICATIONS:  Current Outpatient Medications  Medication Sig Dispense Refill   acetaminophen (TYLENOL) 500 MG tablet Take 1,000 mg by mouth 2 (two) times daily.     Brimonidine Tartrate (LUMIFY) 0.025 % SOLN Place 1 drop into both eyes 2 (two) times a week. As needed     COMBIVENT RESPIMAT 20-100 MCG/ACT AERS respimat INHALE 1 TO 2 PUFFS BY MOUTH EVERY 6 HOURS AS NEEDED FOR WHEEZING 4 g 5   dextromethorphan-guaiFENesin (MUCINEX DM) 30-600 MG 12hr tablet Take 1 tablet by mouth 2 (two) times daily.     doxycycline (VIBRA-TABS) 100 MG tablet Take 1 tablet by mouth twice daily 60 tablet 1   feeding supplement (ENSURE ENLIVE / ENSURE PLUS) LIQD Take 237 mLs by mouth 3 (three) times daily between meals. (Patient taking differently: Take 237 mLs by mouth 2 (two) times daily between meals.) 237 mL 12   fenofibrate 160 MG tablet Take 1 tablet by mouth once daily (Patient taking differently: Take  160 mg by mouth daily.) 90 tablet 3   ferrous sulfate 325 (65 FE) MG tablet Take 1 tablet (325 mg total) by mouth daily with breakfast. 30 tablet 0   folic acid (FOLVITE) 1 MG tablet Take 1 tablet by mouth once daily 30 tablet 0   lidocaine-prilocaine (EMLA) cream Apply 1 application topically as needed. (Patient not taking: Reported on 12/17/2020) 30 g 2   LORazepam (ATIVAN) 1 MG tablet Take 1 tablet (1 mg total) by mouth every 8 (eight) hours as needed for anxiety. 90 tablet 5   meclizine (ANTIVERT) 25 MG tablet Take 1 tablet (25 mg total) by mouth every 6 (six) hours as needed for dizziness.  (Patient not taking: Reported on 12/17/2020) 60 tablet 0   Multiple Vitamin (MULTIVITAMIN WITH MINERALS) TABS tablet Take 1 tablet by mouth daily.     ondansetron (ZOFRAN) 8 MG tablet Take 1 tablet (8 mg total) by mouth every 8 (eight) hours as needed for nausea or vomiting. Starting day 3 after chemotherapy 30 tablet 2   oxyCODONE-acetaminophen (PERCOCET/ROXICET) 5-325 MG tablet Take 1 tablet by mouth every 6 (six) hours as needed for severe pain. 30 tablet 0   potassium chloride SA (KLOR-CON) 20 MEQ tablet Take 1 tablet (20 mEq total) by mouth daily. 6 tablet 0   predniSONE (DELTASONE) 10 MG tablet Take 1 tablet (10 mg total) by mouth daily with breakfast. 90 tablet 0   prochlorperazine (COMPAZINE) 10 MG tablet Take 1 tablet (10 mg total) by mouth every 6 (six) hours as needed. 30 tablet 2   No current facility-administered medications for this visit.   Facility-Administered Medications Ordered in Other Visits  Medication Dose Route Frequency Provider Last Rate Last Admin   0.9 %  sodium chloride infusion   Intravenous Continuous Curt Bears, MD   Stopped at 03/05/20 1410    SURGICAL HISTORY:  Past Surgical History:  Procedure Laterality Date   APPENDECTOMY     BRONCHIAL BIOPSY  01/07/2020   Procedure: BRONCHIAL BIOPSIES;  Surgeon: Collene Gobble, MD;  Location[facility] ENDOSCOPY;  Service: Pulmonary;;   BRONCHIAL BIOPSY  04/05/2020   Procedure: BRONCHIAL BIOPSIES;  Surgeon: Rigoberto Noel, MD;  Location: [facility];  Service: Cardiopulmonary;;   BRONCHIAL BRUSHINGS  01/07/2020   Procedure: BRONCHIAL BRUSHINGS;  Surgeon: Collene Gobble, MD;  Location: [facility] ENDOSCOPY;  Service: Pulmonary;;   BRONCHIAL NEEDLE ASPIRATION BIOPSY  01/07/2020   Procedure: BRONCHIAL NEEDLE ASPIRATION BIOPSIES;  Surgeon: Collene Gobble, MD;  Location[facility] ENDOSCOPY;  Service: Pulmonary;;   BRONCHIAL WASHINGS  01/07/2020   Procedure: BRONCHIAL WASHINGS;  Surgeon: Collene Gobble, MD;  Location: [facility] ENDOSCOPY;   Service: Pulmonary;;   BRONCHIAL WASHINGS  04/05/2020   Procedure: BRONCHIAL WASHINGS;  Surgeon: Rigoberto Noel, MD;  Location: [facility];  Service: Cardiopulmonary;;   COLONOSCOPY  15 years ago   HEMOSTASIS CONTROL  01/07/2020   Procedure: HEMOSTASIS CONTROL;  Surgeon: Collene Gobble, MD;  Location: [facility] ENDOSCOPY;  Service: Pulmonary;;  cold saline   IR IMAGING GUIDED PORT INSERTION  09/18/2020   NASAL TURBINATE REDUCTION  2002   Dr.Crossley   VASECTOMY     VIDEO BRONCHOSCOPY Right 04/05/2020   Procedure: VIDEO BRONCHOSCOPY WITH FLUORO;  Surgeon: Rigoberto Noel, MD;  Location: [facility];  Service: Cardiopulmonary;  Laterality: Right;   VIDEO BRONCHOSCOPY WITH ENDOBRONCHIAL NAVIGATION N/A 01/07/2020   Procedure: VIDEO BRONCHOSCOPY WITH ENDOBRONCHIAL NAVIGATION;  Surgeon: Collene Gobble, MD;  Location: [facility] ENDOSCOPY;  Service: Pulmonary;  Laterality: N/A;   VIDEO BRONCHOSCOPY WITH ENDOBRONCHIAL ULTRASOUND N/A 01/07/2020   Procedure: VIDEO BRONCHOSCOPY WITH ENDOBRONCHIAL ULTRASOUND;  Surgeon: Collene Gobble, MD;  Location: [facility] ENDOSCOPY;  Service: Pulmonary;  Laterality: N/A;    REVIEW OF SYSTEMS:  A comprehensive review of systems was negative except for: Respiratory: positive for pleurisy/chest pain   PHYSICAL EXAMINATION: General appearance: alert, cooperative, and no distress Head: Normocephalic, without obvious abnormality, atraumatic Neck: no adenopathy, no JVD, supple, symmetrical, trachea midline, and thyroid not enlarged, symmetric, no tenderness/mass/nodules Lymph nodes: Cervical, supraclavicular, and axillary nodes normal. Resp: clear to auscultation bilaterally Back: symmetric, no curvature. ROM normal. No CVA tenderness. Cardio: regular rate and rhythm, S1, S2 normal, no murmur, click, rub or gallop GI: soft, non-tender; bowel sounds normal; no masses,  no organomegaly Extremities: extremities normal, atraumatic, no cyanosis or edema  ECOG PERFORMANCE STATUS: 1 -  Symptomatic but completely ambulatory  Blood pressure 117/68, pulse 99, temperature 97.7 F (36.5 C), temperature source Tympanic, resp. rate 19, height _0  (1.778 m), weight 196 lb 8 oz (89.1 kg), SpO2 100 %.  LABORATORY DATA: Lab Results  Component Value Date   WBC 6.6 01/07/2021   HGB 10.0 (L) 01/07/2021   HCT 29.7 (L) 01/07/2021   MCV 94.3 01/07/2021   PLT 245 01/07/2021      Chemistry      Component Value Date/Time   NA 138 12/17/2020 1010   K 3.3 (L) 12/17/2020 1010   CL 107 12/17/2020 1010   CO2 24 12/17/2020 1010   BUN 15 12/17/2020 1010   CREATININE 1.01 12/17/2020 1010   CREATININE 0.67 (L) 06/24/2020 1517      Component Value Date/Time   CALCIUM 9.4 12/17/2020 1010   ALKPHOS 86 12/17/2020 1010   AST 18 12/17/2020 1010   ALT 11 12/17/2020 1010   BILITOT 0.6 12/17/2020 1010       RADIOGRAPHIC STUDIES: CT Chest W Contrast  Result Date: 12/11/2020 CLINICAL DATA:  Non-small cell lung cancer EXAM: CT CHEST, ABDOMEN AND PELVIS WITHOUT CONTRAST TECHNIQUE: Multidetector CT imaging of the chest, abdomen and pelvis was performed following the standard protocol without IV contrast. COMPARISON:  CT chest dated October 21, 2020; PET-CT dated Sep 10, 2020 FINDINGS: CT CHEST FINDINGS Cardiovascular: Normal heart size. Unchanged small pericardial effusion. No significant coronary artery calcifications. Atherosclerotic disease of the thoracic aorta. Right chest wall port with tip near the superior cavoatrial junction. Mediastinum/Nodes: No enlarged lymph nodes in the chest. Esophagus and thyroid are unremarkable. Lungs/Pleura: Central airways are patent. Upper lobe predominant centrilobular emphysema. Left upper lobe linear opacities with associated cystic change and traction bronchiectasis, likely post radiation change, findings are unchanged compared to prior. Unchanged right pleural thickening. Previously seen hypermetabolic pleural nodules are less conspicuous. Reference right  lower lobe subpleural nodule measures 3 mm on series 2, image 45, previously measured 5 mm. Stable left lower lobe solid pulmonary nodule measuring 6 mm on series 4, image 106. Musculoskeletal: Redemonstrated compression deformity of T12. No new osseous metastatic disease. CT ABDOMEN PELVIS FINDINGS Hepatobiliary: Left hepatic lobe metastatic lesion which was seen on prior PET-CT is not visualized on today's exam. No gallstones, gallbladder wall thickening, or biliary dilatation. Pancreas: Unremarkable. No pancreatic ductal dilatation or surrounding inflammatory changes. Spleen: Normal in size without focal abnormality. Adrenals/Urinary Tract: Bilateral adrenal glands are unremarkable. Lesion of the mid region of the right kidney has decreased in size compared to prior PET-CT. No hydronephrosis or nephrolithiasis. Mild bladder wall thickening. Stomach/Bowel: Stomach is  within normal limits. Appendix is not visualized. No evidence of bowel wall thickening, distention, or inflammatory changes. Vascular/Lymphatic: Aortic atherosclerosis. Decreased size of retroperitoneal lymph nodes. For example, decreased size pericaval node which was hypermetabolic on prior PET-CT measuring 4 mm previously 7 mm. No enlarged abdominal or pelvic lymph nodes. Reproductive: Prostatomegaly. Other: No abdominal wall hernia or abnormality. No abdominopelvic ascites. Musculoskeletal: Unchanged subtle sclerotic lesions of the pelvis and bilateral ribs. No new osseous metastatic disease. IMPRESSION: Previously seen hypermetabolic pleural nodules are less conspicuous compared with prior chest CT dated October 21, 2020. Left hepatic lobe metastatic lesion which was seen on prior PET-CT is not visualized today's exam. Decreased size of right renal lesion when compared with prior PET-CT. Decreased size of retroperitoneal lymph nodes when compared with prior PET-CT. Unchanged subtle sclerotic lesions which correlate with areas of previously seen FDG  avidity on prior PET-CT. No evidence worsening osseous metastatic disease. Aortic Atherosclerosis (ICD10-I70.0) and Emphysema (ICD10-J43.9). Electronically Signed   By: Yetta Glassman M.D.   On: 12/11/2020 17:02   CT Abdomen Pelvis W Contrast  Result Date: 12/11/2020 CLINICAL DATA:  Non-small cell lung cancer EXAM: CT CHEST, ABDOMEN AND PELVIS WITHOUT CONTRAST TECHNIQUE: Multidetector CT imaging of the chest, abdomen and pelvis was performed following the standard protocol without IV contrast. COMPARISON:  CT chest dated October 21, 2020; PET-CT dated Sep 10, 2020 FINDINGS: CT CHEST FINDINGS Cardiovascular: Normal heart size. Unchanged small pericardial effusion. No significant coronary artery calcifications. Atherosclerotic disease of the thoracic aorta. Right chest wall port with tip near the superior cavoatrial junction. Mediastinum/Nodes: No enlarged lymph nodes in the chest. Esophagus and thyroid are unremarkable. Lungs/Pleura: Central airways are patent. Upper lobe predominant centrilobular emphysema. Left upper lobe linear opacities with associated cystic change and traction bronchiectasis, likely post radiation change, findings are unchanged compared to prior. Unchanged right pleural thickening. Previously seen hypermetabolic pleural nodules are less conspicuous. Reference right lower lobe subpleural nodule measures 3 mm on series 2, image 45, previously measured 5 mm. Stable left lower lobe solid pulmonary nodule measuring 6 mm on series 4, image 106. Musculoskeletal: Redemonstrated compression deformity of T12. No new osseous metastatic disease. CT ABDOMEN PELVIS FINDINGS Hepatobiliary: Left hepatic lobe metastatic lesion which was seen on prior PET-CT is not visualized on today's exam. No gallstones, gallbladder wall thickening, or biliary dilatation. Pancreas: Unremarkable. No pancreatic ductal dilatation or surrounding inflammatory changes. Spleen: Normal in size without focal abnormality.  Adrenals/Urinary Tract: Bilateral adrenal glands are unremarkable. Lesion of the mid region of the right kidney has decreased in size compared to prior PET-CT. No hydronephrosis or nephrolithiasis. Mild bladder wall thickening. Stomach/Bowel: Stomach is within normal limits. Appendix is not visualized. No evidence of bowel wall thickening, distention, or inflammatory changes. Vascular/Lymphatic: Aortic atherosclerosis. Decreased size of retroperitoneal lymph nodes. For example, decreased size pericaval node which was hypermetabolic on prior PET-CT measuring 4 mm previously 7 mm. No enlarged abdominal or pelvic lymph nodes. Reproductive: Prostatomegaly. Other: No abdominal wall hernia or abnormality. No abdominopelvic ascites. Musculoskeletal: Unchanged subtle sclerotic lesions of the pelvis and bilateral ribs. No new osseous metastatic disease. IMPRESSION: Previously seen hypermetabolic pleural nodules are less conspicuous compared with prior chest CT dated October 21, 2020. Left hepatic lobe metastatic lesion which was seen on prior PET-CT is not visualized today's exam. Decreased size of right renal lesion when compared with prior PET-CT. Decreased size of retroperitoneal lymph nodes when compared with prior PET-CT. Unchanged subtle sclerotic lesions which correlate with areas of previously seen  FDG avidity on prior PET-CT. No evidence worsening osseous metastatic disease. Aortic Atherosclerosis (ICD10-I70.0) and Emphysema (ICD10-J43.9). Electronically Signed   By: Yetta Glassman M.D.   On: 12/11/2020 17:02     ASSESSMENT AND PLAN: This is a very pleasant 60 years old white male with metastatic non-small cell lung cancer that was initially diagnosed as stage IIb (T3, N0, M0) non-small cell lung cancer, adenocarcinoma presented with perihilar right upper lobe lung mass with suspicious groundglass opacity in the right middle lobe.  He had evidence for disease metastasis in March 2022 to the right kidney that was  biopsy-proven in April 2022. The patient is not a good surgical candidate for resection according to Dr. Roxan Hockey. The patient underwent a course of concurrent chemoradiation with weekly carboplatin for AUC of 2 and paclitaxel 45 mg/M2 status post 5 cycles.  He has been tolerating this treatment well except for fatigue, mild odynophagia as well as recurrent pneumonia and septicemia.  The patient also has radiation-induced pneumonitis. He was admitted to the hospital several times with recurrent pneumonia.   The patient had evidence for metastatic disease in April 2022 and he is currently undergoing systemic chemotherapy with carboplatin for AUC of 5, Alimta 500 Mg/M2 and Keytruda 200 Mg IV every 3 weeks status post 5 cycles.  Starting from cycle #5 the patient is on maintenance treatment with Alimta and Keytruda every 3 weeks. He tolerated the last cycle of his treatment fairly well. I recommended for him to proceed with cycle #6 today as planned. For pain management, he will continue his treatment with oxycodone on as-needed basis.  I will give him refill of his medication today. The patient was advised to call immediately if he has any concerning symptoms in the interval. The patient voices understanding of current disease status and treatment options and is in agreement with the current care plan.  All questions were answered. The patient knows to call the clinic with any problems, questions or concerns. We can certainly see the patient much sooner if necessary.  Disclaimer: This note was dictated with voice recognition software. Similar sounding words can inadvertently be transcribed and may not be corrected upon review.

## 2021-01-07 NOTE — Patient Instructions (Signed)
[facility]  Discharge Instructions: Thank you for choosing [facility] to provide your oncology and hematology care.   If you have a lab appointment with the [facility], please go directly to the [facility] and check in at the registration area.   Wear comfortable clothing and clothing appropriate for easy access to any Portacath or PICC line.   We strive to give you quality time with your provider. You may need to reschedule your appointment if you arrive late (15 or more minutes).  Arriving late affects you and other patients whose appointments are after yours.  Also, if you miss three or more appointments without notifying the office, you may be dismissed from the clinic at the provider's discretion.      For prescription refill requests, have your pharmacy contact our office and allow 72 hours for refills to be completed.    Today you received the following chemotherapy and/or immunotherapy agents Pembrolizumab and Pemetrexed      To help prevent nausea and vomiting after your treatment, we encourage you to take your nausea medication as directed.  BELOW ARE SYMPTOMS THAT SHOULD BE REPORTED IMMEDIATELY: *FEVER GREATER THAN 100.4 F (38 C) OR HIGHER *CHILLS OR SWEATING *NAUSEA AND VOMITING THAT IS NOT CONTROLLED WITH YOUR NAUSEA MEDICATION *UNUSUAL SHORTNESS OF BREATH *UNUSUAL BRUISING OR BLEEDING *URINARY PROBLEMS (pain or burning when urinating, or frequent urination) *BOWEL PROBLEMS (unusual diarrhea, constipation, pain near the anus) TENDERNESS IN MOUTH AND THROAT WITH OR WITHOUT PRESENCE OF ULCERS (sore throat, sores in mouth, or a toothache) UNUSUAL RASH, SWELLING OR PAIN  UNUSUAL VAGINAL DISCHARGE OR ITCHING   Items with * indicate a potential emergency and should be followed up as soon as possible or go to the Emergency Department if any problems should occur.  Please show the CHEMOTHERAPY ALERT CARD or IMMUNOTHERAPY ALERT  CARD at check-in to the Emergency Department and triage nurse.  Should you have questions after your visit or need to cancel or reschedule your appointment, please contact [facility]  Dept: [phone]  and follow the prompts.  Office hours are 8:00 a.m. to 4:30 p.m. Monday - Friday. Please note that voicemails left after 4:00 p.m. may not be returned until the following business day.  We are closed weekends and major holidays. You have access to a nurse at all times for urgent questions. Please call the main number to the clinic Dept: [phone] and follow the prompts.   For any non-urgent questions, you may also contact your provider using MyChart. We now offer e-Visits for anyone 25 and older to request care online for non-urgent symptoms. For details visit [email or web address].   Also download the MyChart app! Go to the app store, search "MyChart", open the app, select [facility], and log in with your MyChart username and password.  Due to Covid, a mask is required upon entering the hospital/clinic. If you do not have a mask, one will be given to you upon arrival. For doctor visits, patients may have 1 support person aged 37 or older with them. For treatment visits, patients cannot have anyone with them due to current Covid guidelines and our immunocompromised population.

## 2021-01-07 NOTE — Telephone Encounter (Signed)
Scheduled appt per 9/15 los  - pt to get an updated schedule next visit.

## 2021-01-12 ENCOUNTER — Other Ambulatory Visit: Payer: Self-pay | Admitting: Internal Medicine

## 2021-01-12 DIAGNOSIS — C3491 Malignant neoplasm of unspecified part of right bronchus or lung: Secondary | ICD-10-CM

## 2021-01-14 ENCOUNTER — Inpatient Hospital Stay: Payer: BC Managed Care – PPO

## 2021-01-14 NOTE — Telephone Encounter (Signed)
Called and spoke to patient - he had not called [place] and did not know anything about an update to his disability forms.  He stated he is now receiving SS disability.  I let him know that Dr. Julien Nordmann is the provider that completed the disability papers in July and that if there is anything we need to do to please call us back.

## 2021-01-14 NOTE — Telephone Encounter (Signed)
Please advise on update. Thanks.  

## 2021-01-15 ENCOUNTER — Other Ambulatory Visit: Payer: Self-pay

## 2021-01-15 ENCOUNTER — Ambulatory Visit
Admission: RE | Admit: 2021-01-15 | Discharge: 2021-01-15 | Disposition: A | Discharge status: Home / Self Care | Payer: Self-pay | Source: Ambulatory Visit | Attending: Radiation Oncology | Admitting: Radiation Oncology

## 2021-01-15 DIAGNOSIS — C7949 Secondary malignant neoplasm of other parts of nervous system: Secondary | ICD-10-CM

## 2021-01-15 DIAGNOSIS — C7931 Secondary malignant neoplasm of brain: Secondary | ICD-10-CM

## 2021-01-15 IMAGING — MR MR HEAD WO/W CM
12 series · 48 of 48 positions shown · IV contrast (20 mL Multihance)
Comparison: [DATE]

CLINICAL DATA: Metastatic lung cancer, surveillance

EXAM:
MRI HEAD WITHOUT AND WITH CONTRAST
TECHNIQUE: Multiplanar, multiecho pulse sequences of the brain and surrounding
structures were obtained without and with intravenous contrast.
CONTRAST:  19mL MULTIHANCE GADOBENATE DIMEGLUMINE 529 MG/ML IV SOLN

[Series 2: FLAIR · sagittal · 3.0mm · 0.75mm/px · 3 of 40 slices shown (1 of 2)]
[im 1/40]
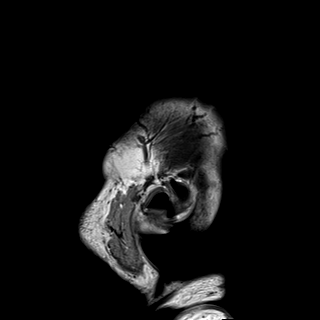
[im 20/40]
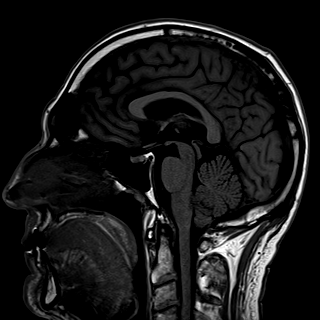
[im 40/40]
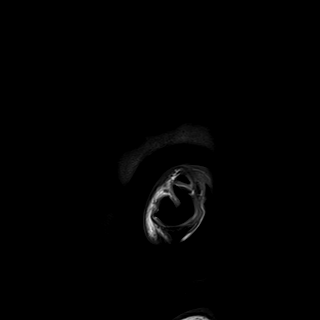

[Series 3: DWI · axial · 3.0mm · 1.50mm/px · z∈[-42,+98]mm · 4 of 78 slices shown (1 of 2)]
[im 1/78]
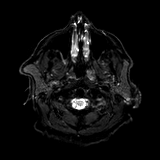
[im 26/78]
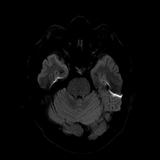
[im 52/78]
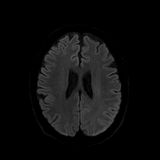
[im 78/78]
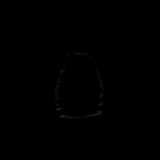

[Series 4: DWI · axial · 3.0mm · 1.50mm/px · z∈[-42,+98]mm · 2 of 37 slices shown (2 of 2)]
[im 1/37]
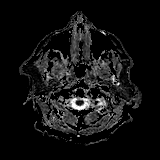
[im 37/37]
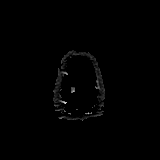

[Series 5: T2 · axial · 5.0mm · 0.57mm/px · 1 of 27 slices shown (1 of 2)]
[im 1/27]
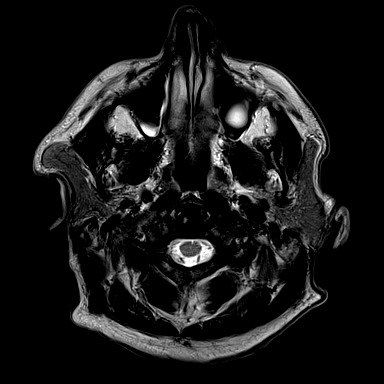

[Series 6: swi_images · axial · 1.5mm · 0.90mm/px · z∈[-48,+99]mm · 5 of 104 slices shown]
[im 1/104]
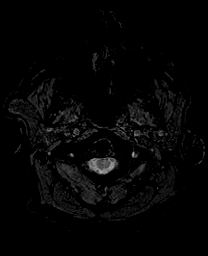
[im 26/104]
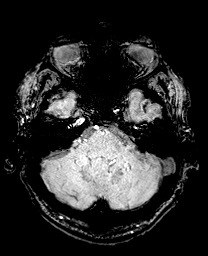
[im 52/104]
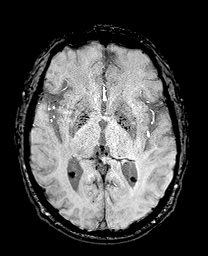
[im 78/104]
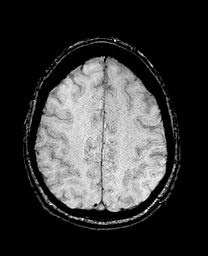
[im 104/104]
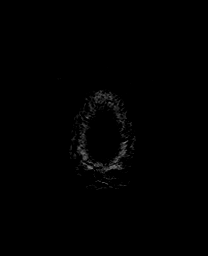

[Series 8: FLAIR · axial · 3.0mm · 0.86mm/px · z∈[-120,+72]mm · 3 of 65 slices shown (2 of 2)]
[im 1/65]
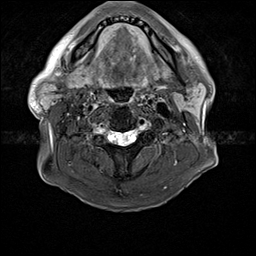
[im 33/65]
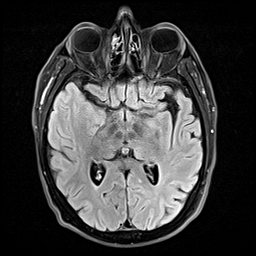
[im 65/65]
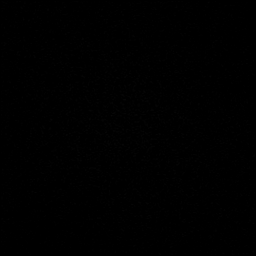

[Series 9: T2 · axial · non-contrast · 1.0mm · 0.86mm/px · z∈[-105,+67]mm · 8 of 176 slices shown (2 of 2)]
[im 1/176]
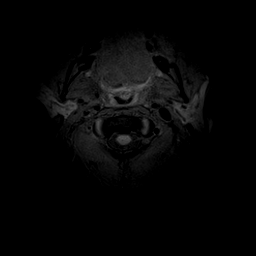
[im 26/176]
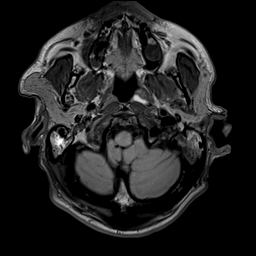
[im 51/176]
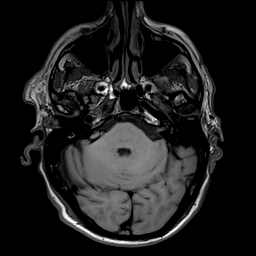
[im 76/176]
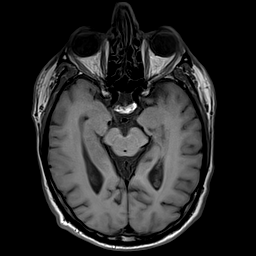
[im 101/176]
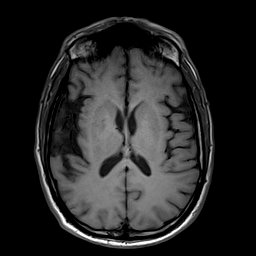
[im 126/176]
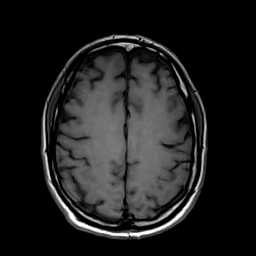
[im 151/176]
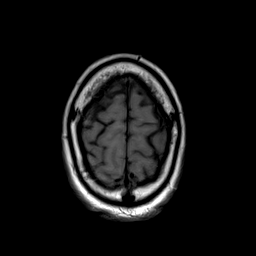
[im 176/176]
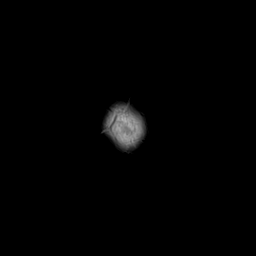

[Series 10: T2 post-contrast · coronal · 3.0mm · 0.57mm/px · 2 of 47 slices shown (1 of 2)]
[im 1/47]
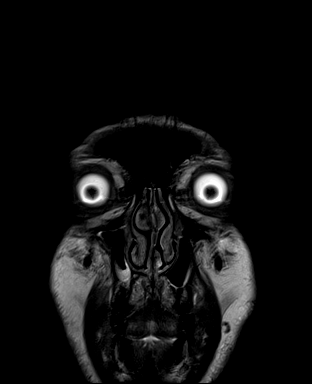
[im 47/47]
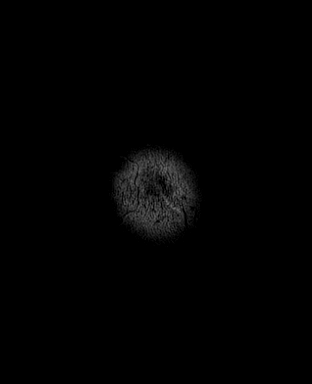

[Series 11: T2 post-contrast · axial · 1.0mm · 0.86mm/px · z∈[-105,+67]mm · 8 of 176 slices shown (2 of 2)]
[im 1/176]
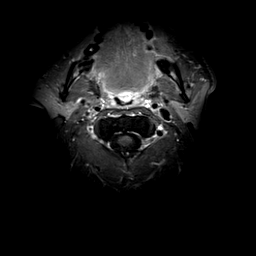
[im 26/176]
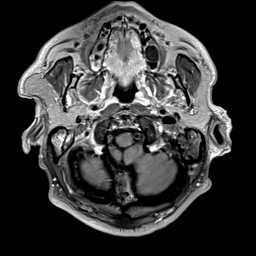
[im 51/176]
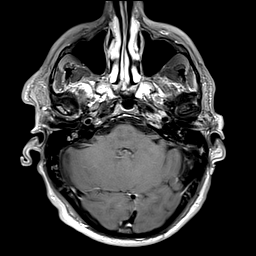
[im 76/176]
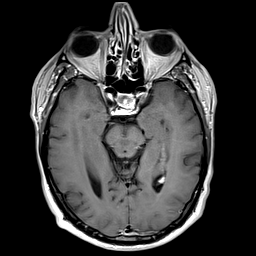
[im 101/176]
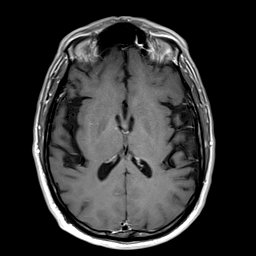
[im 126/176]
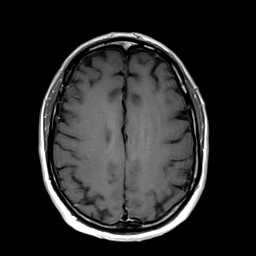
[im 151/176]
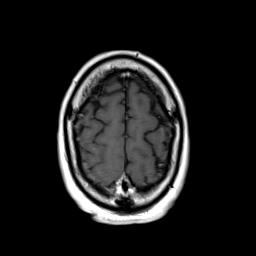
[im 176/176]
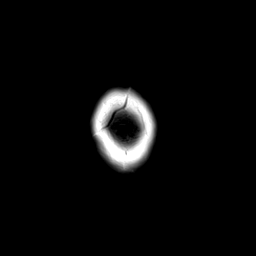

[Series 12: T1 post-contrast · axial · 1.0mm · 0.75mm/px · z∈[-98,+61]mm · 8 of 160 slices shown (1 of 2)]
[im 1/160]
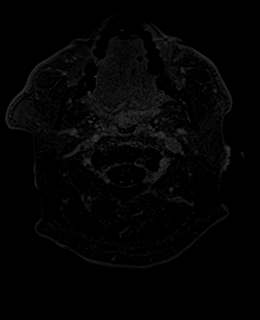
[im 23/160]
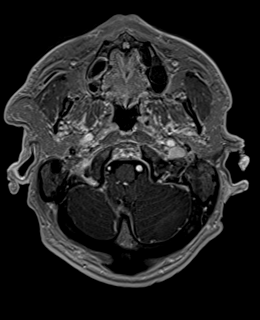
[im 46/160]
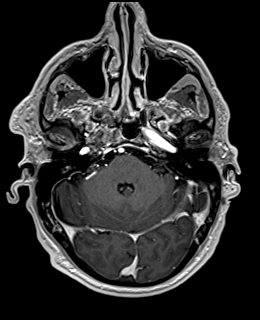
[im 69/160]
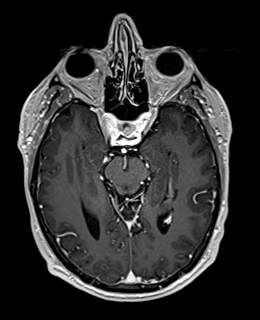
[im 91/160]
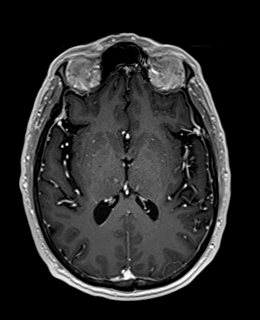
[im 114/160]
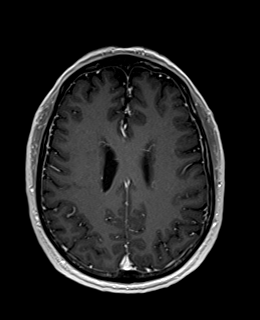
[im 137/160]
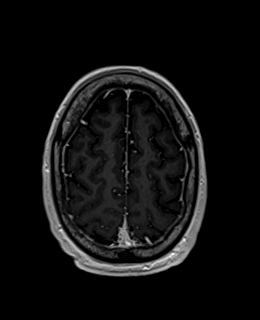
[im 160/160]
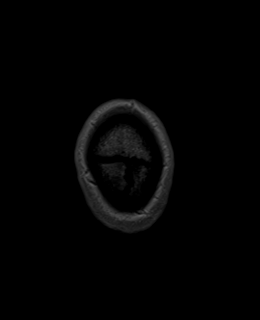

[Series 13: T1 post-contrast · coronal · 3.0mm · 0.57mm/px · 2 of 47 slices shown (2 of 2)]
[im 1/47]
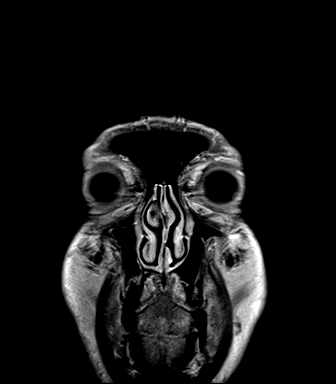
[im 47/47]
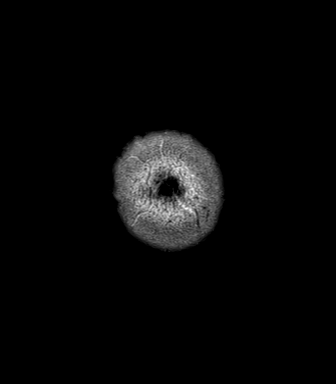

[Series 14: FLAIR post-contrast · sagittal · 3.0mm · 0.75mm/px · 2 of 40 slices shown]
[im 1/40]
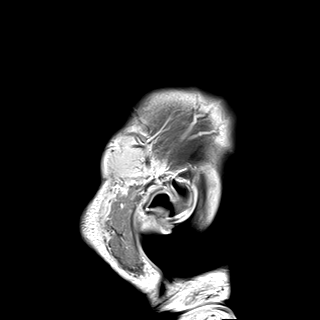
[im 40/40]
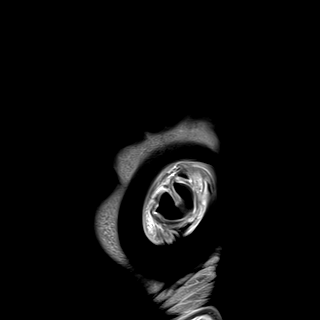

[48 of 48 positions shown; findings below may reference images not displayed]

FINDINGS: Brain: Decrease in size of peripherally enhancing right cerebellar
lesion now measuring 6 x 3 mm (previously 8 x 9 mm). Resolution of
associated edema. The second punctate focus of enhancement within
the right cerebellum on the prior study is no longer identified.
Additional left frontal enhancement reported on the prior study is
no longer identified.

No new mass or abnormal enhancement.

There is no acute infarction.  Ventricles are stable in size.

Vascular: Major vessel flow voids at the skull base are preserved.

Skull and upper cervical spine: Normal marrow signal is preserved.

Sinuses/Orbits: Minor mucosal thickening.  Orbits are unremarkable.

Other: Sella is unremarkable.  Minor mastoid fluid opacification.
IMPRESSION: Decrease in size of right cerebellar lesion. Additional foci of
enhancement in the right cerebellum and left frontal lobe are no
longer seen. No new lesion.

## 2021-01-15 MED ORDER — GADOBENATE DIMEGLUMINE 529 MG/ML IV SOLN
19.0000 mL | Freq: Once | INTRAVENOUS | Status: AC | PRN
  Administered 2021-01-15: 19 mL via INTRAVENOUS

## 2021-01-18 ENCOUNTER — Inpatient Hospital Stay: Payer: BC Managed Care – PPO

## 2021-01-21 ENCOUNTER — Inpatient Hospital Stay: Payer: BC Managed Care – PPO

## 2021-01-22 ENCOUNTER — Other Ambulatory Visit: Payer: Self-pay | Admitting: Radiation Therapy

## 2021-01-22 DIAGNOSIS — C7931 Secondary malignant neoplasm of brain: Secondary | ICD-10-CM

## 2021-01-25 ENCOUNTER — Other Ambulatory Visit: Payer: Self-pay | Admitting: Pulmonary Disease

## 2021-01-28 ENCOUNTER — Inpatient Hospital Stay: Payer: BC Managed Care – PPO | Attending: Internal Medicine | Admitting: Internal Medicine

## 2021-01-28 ENCOUNTER — Inpatient Hospital Stay: Payer: BC Managed Care – PPO

## 2021-01-28 ENCOUNTER — Telehealth: Payer: Self-pay | Admitting: Physician Assistant

## 2021-01-28 ENCOUNTER — Other Ambulatory Visit: Payer: Self-pay

## 2021-01-28 ENCOUNTER — Other Ambulatory Visit: Payer: Self-pay | Admitting: Physician Assistant

## 2021-01-28 ENCOUNTER — Encounter: Payer: Self-pay | Admitting: Internal Medicine

## 2021-01-28 VITALS — BP 112/74 | HR 110 | Temp 98.2°F | Resp 19 | Ht 70.0 in | Wt 192.2 lb

## 2021-01-28 VITALS — HR 93

## 2021-01-28 DIAGNOSIS — C7901 Secondary malignant neoplasm of right kidney and renal pelvis: Secondary | ICD-10-CM | POA: Diagnosis not present

## 2021-01-28 DIAGNOSIS — C3482 Malignant neoplasm of overlapping sites of left bronchus and lung: Secondary | ICD-10-CM | POA: Diagnosis present

## 2021-01-28 DIAGNOSIS — D649 Anemia, unspecified: Secondary | ICD-10-CM

## 2021-01-28 DIAGNOSIS — K5900 Constipation, unspecified: Secondary | ICD-10-CM | POA: Diagnosis not present

## 2021-01-28 DIAGNOSIS — Z79899 Other long term (current) drug therapy: Secondary | ICD-10-CM | POA: Diagnosis not present

## 2021-01-28 DIAGNOSIS — Z923 Personal history of irradiation: Secondary | ICD-10-CM | POA: Insufficient documentation

## 2021-01-28 DIAGNOSIS — C3491 Malignant neoplasm of unspecified part of right bronchus or lung: Secondary | ICD-10-CM

## 2021-01-28 DIAGNOSIS — E876 Hypokalemia: Secondary | ICD-10-CM

## 2021-01-28 DIAGNOSIS — E860 Dehydration: Secondary | ICD-10-CM

## 2021-01-28 DIAGNOSIS — G893 Neoplasm related pain (acute) (chronic): Secondary | ICD-10-CM | POA: Diagnosis not present

## 2021-01-28 DIAGNOSIS — R000 Tachycardia, unspecified: Secondary | ICD-10-CM | POA: Insufficient documentation

## 2021-01-28 DIAGNOSIS — Z5111 Encounter for antineoplastic chemotherapy: Secondary | ICD-10-CM | POA: Insufficient documentation

## 2021-01-28 DIAGNOSIS — C7931 Secondary malignant neoplasm of brain: Secondary | ICD-10-CM

## 2021-01-28 DIAGNOSIS — Z9221 Personal history of antineoplastic chemotherapy: Secondary | ICD-10-CM | POA: Insufficient documentation

## 2021-01-28 DIAGNOSIS — Z79891 Long term (current) use of opiate analgesic: Secondary | ICD-10-CM | POA: Insufficient documentation

## 2021-01-28 DIAGNOSIS — Z5112 Encounter for antineoplastic immunotherapy: Secondary | ICD-10-CM | POA: Diagnosis present

## 2021-01-28 DIAGNOSIS — Z95828 Presence of other vascular implants and grafts: Secondary | ICD-10-CM

## 2021-01-28 DIAGNOSIS — C3490 Malignant neoplasm of unspecified part of unspecified bronchus or lung: Secondary | ICD-10-CM

## 2021-01-28 DIAGNOSIS — R058 Other specified cough: Secondary | ICD-10-CM | POA: Insufficient documentation

## 2021-01-28 LAB — CMP (CANCER CENTER ONLY)
ALT: 12 U/L (ref 0–44)
AST: 26 U/L (ref 15–41)
Albumin: 3.5 g/dL (ref 3.5–5.0)
Alkaline Phosphatase: 94 U/L (ref 38–126)
Anion gap: 10 (ref 5–15)
BUN: 20 mg/dL (ref 6–20)
CO2: 23 mmol/L (ref 22–32)
Calcium: 9.4 mg/dL (ref 8.9–10.3)
Chloride: 106 mmol/L (ref 98–111)
Creatinine: 1.07 mg/dL (ref 0.61–1.24)
GFR, Estimated: >60 mL/min (ref >60)
Glucose, Bld: 99 mg/dL (ref 70–99)
Potassium: 3.2 mmol/L — ABNORMAL LOW (ref 3.5–5.1)
Sodium: 139 mmol/L (ref 135–145)
Total Bilirubin: 0.5 mg/dL (ref 0.3–1.2)
Total Protein: 7 g/dL (ref 6.5–8.1)

## 2021-01-28 LAB — CBC WITH DIFFERENTIAL (CANCER CENTER ONLY)
Abs Immature Granulocytes: 0.03 10*3/uL (ref 0.00–0.07)
Basophils Absolute: 0 10*3/uL (ref 0.0–0.1)
Basophils Relative: 1 %
Eosinophils Absolute: 0.1 10*3/uL (ref 0.0–0.5)
Eosinophils Relative: 2 %
HCT: 30.3 % — ABNORMAL LOW (ref 39.0–52.0)
Hemoglobin: 10.5 g/dL — ABNORMAL LOW (ref 13.0–17.0)
Immature Granulocytes: 1 %
Lymphocytes Relative: 11 %
Lymphs Abs: 0.7 10*3/uL (ref 0.7–4.0)
MCH: 33.1 pg (ref 26.0–34.0)
MCHC: 34.7 g/dL (ref 30.0–36.0)
MCV: 95.6 fL (ref 80.0–100.0)
Monocytes Absolute: 1.3 10*3/uL — ABNORMAL HIGH (ref 0.1–1.0)
Monocytes Relative: 21 %
Neutro Abs: 4 10*3/uL (ref 1.7–7.7)
Neutrophils Relative %: 64 %
Platelet Count: 284 10*3/uL (ref 150–400)
RBC: 3.17 MIL/uL — ABNORMAL LOW (ref 4.22–5.81)
RDW: 17.6 % — ABNORMAL HIGH (ref 11.5–15.5)
WBC Count: 6.2 10*3/uL (ref 4.0–10.5)
nRBC: 0 % (ref 0.0–0.2)

## 2021-01-28 LAB — TSH: TSH: 1.53 u[IU]/mL (ref 0.320–4.118)

## 2021-01-28 MED ORDER — SODIUM CHLORIDE 0.9 % IV SOLN
Freq: Once | INTRAVENOUS | Status: AC
Start: 2021-01-28 — End: 2021-01-28

## 2021-01-28 MED ORDER — HEPARIN SOD (PORK) LOCK FLUSH 100 UNIT/ML IV SOLN
500.0000 [IU] | Freq: Once | INTRAVENOUS | Status: AC | PRN
  Administered 2021-01-28: 500 [IU]

## 2021-01-28 MED ORDER — SODIUM CHLORIDE 0.9 % IV SOLN
500.0000 mg/m2 | Freq: Once | INTRAVENOUS | Status: AC
  Administered 2021-01-28: 1000 mg via INTRAVENOUS
  Filled 2021-01-28: qty 40

## 2021-01-28 MED ORDER — POTASSIUM CHLORIDE CRYS ER 20 MEQ PO TBCR: 20.0000 meq | EXTENDED_RELEASE_TABLET | Freq: Every day | ORAL | 0 refills | Status: DC

## 2021-01-28 MED ORDER — PROCHLORPERAZINE MALEATE 10 MG PO TABS
ORAL_TABLET | ORAL | Status: AC
  Filled 2021-01-28: qty 1

## 2021-01-28 MED ORDER — SODIUM CHLORIDE 0.9% FLUSH
10.0000 mL | INTRAVENOUS | Status: AC | PRN
  Administered 2021-01-28: 10 mL

## 2021-01-28 MED ORDER — PROCHLORPERAZINE MALEATE 10 MG PO TABS
10.0000 mg | ORAL_TABLET | Freq: Once | ORAL | Status: AC
  Administered 2021-01-28: 10 mg via ORAL

## 2021-01-28 MED ORDER — SODIUM CHLORIDE 0.9 % IV SOLN
200.0000 mg | Freq: Once | INTRAVENOUS | Status: AC
  Administered 2021-01-28: 200 mg via INTRAVENOUS
  Filled 2021-01-28: qty 8

## 2021-01-28 MED ORDER — SODIUM CHLORIDE 0.9 % IV SOLN: Freq: Once | INTRAVENOUS | Status: AC

## 2021-01-28 MED ORDER — SODIUM CHLORIDE 0.9% FLUSH
10.0000 mL | INTRAVENOUS | Status: DC | PRN
  Administered 2021-01-28: 10 mL

## 2021-01-28 NOTE — Telephone Encounter (Signed)
I called the patient to let him know that I am sending potassium supplements to his pharmacy.

## 2021-01-28 NOTE — Progress Notes (Signed)
[facility] [phone]   [phone]  OFFICE PROGRESS NOTE  [name], DO [facility]  [address] [place] [address]  DIAGNOSIS: Metastatic non-small cell lung cancer initially diagnosed as stage IIB (T3, N0, M0) non-small cell lung cancer, adenocarcinoma presented with large central perihilar mass with suspicious groundglass opacity in the right middle lobe and left upper lobe diagnosed in September 2021.  The patient had evidence of metastatic disease to the right kidney in March 2022.  Molecular studies by [facility] 360: No actionable mutations.   PDl1: 80%   PRIOR THERAPY: Concurrent chemoradiation with weekly carboplatin for AUC of 2 and paclitaxel 45 mg/M2.  First dose February 10, 2020.  Status post 5 cycles.   CURRENT THERAPY:  1) Palliative systemic chemotherapy with carboplatin AUC of 5, Alimta 500 mg per metered squared, Keytruda 200 mg IV every 3 weeks.  First dose expected on 09/24/2020. Status post 6 cycles.  Starting from cycle #5 the patient is on maintenance treatment with Alimta and Keytruda every 3 weeks. 2) Palliative radiation to the left anterior rib cage area as well as the mid back area under the care of Dr. Sondra Come. Last treatment on 10/05/20.  INTERVAL HISTORY: Richard Li 60 y.o. male returns to the clinic today for follow-up visit.  The patient is feeling fine today with no concerning complaints except for the baseline shortness of breath increased with exertion.  The pain on the left side of the chest has significantly improved and he is taking Tylenol on as-needed basis.  He used oxycodone few times for the left hip pain but most of the time he is using Tylenol.  He is current on treatment with doxycycline by Dr. Silas Flood.  He denied having any nausea, vomiting, diarrhea or constipation.  He denied having any fever or chills.  He has no significant weight loss or night sweats.  The patient is here today  for evaluation before starting cycle #7 of his treatment.   MEDICAL HISTORY: Past Medical History:  Diagnosis Date   Allergic rhinitis, cause unspecified    Anxiety state, unspecified    Asthma    as a child   Chronic airway obstruction, not elsewhere classified    Esophageal reflux    History of kidney stones    History of radiation therapy 02/13/2020-03/24/2020   right lung        Dr Gery Pray   Hypertension    Lumbago    lung ca dx'd 12/2019   Other and unspecified hyperlipidemia    Other chest pain     ALLERGIES:  is allergic to penicillins and amoxicillin.  MEDICATIONS:  Current Outpatient Medications  Medication Sig Dispense Refill   acetaminophen (TYLENOL) 500 MG tablet Take 1,000 mg by mouth 2 (two) times daily.     Brimonidine Tartrate (LUMIFY) 0.025 % SOLN Place 1 drop into both eyes 2 (two) times a week. As needed     COMBIVENT RESPIMAT 20-100 MCG/ACT AERS respimat INHALE 1 TO 2 PUFFS BY MOUTH EVERY 6 HOURS AS NEEDED FOR WHEEZING 4 g 5   dextromethorphan-guaiFENesin (MUCINEX DM) 30-600 MG 12hr tablet Take 1 tablet by mouth 2 (two) times daily.     doxycycline (VIBRA-TABS) 100 MG tablet Take 1 tablet by mouth twice daily 60 tablet 0   feeding supplement (ENSURE ENLIVE / ENSURE PLUS) LIQD Take 237 mLs by mouth 3 (three) times daily between meals. (Patient taking differently: Take 237  mLs by mouth 2 (two) times daily between meals.) 237 mL 12   fenofibrate 160 MG tablet Take 1 tablet by mouth once daily (Patient taking differently: Take 160 mg by mouth daily.) 90 tablet 3   ferrous sulfate 325 (65 FE) MG tablet Take 1 tablet (325 mg total) by mouth daily with breakfast. 30 tablet 0   folic acid (FOLVITE) 1 MG tablet Take 1 tablet by mouth once daily 30 tablet 0   lidocaine-prilocaine (EMLA) cream Apply 1 application topically as needed. (Patient not taking: Reported on 12/17/2020) 30 g 2   LORazepam (ATIVAN) 1 MG tablet Take 1 tablet (1 mg total) by mouth every 8 (eight)  hours as needed for anxiety. 90 tablet 5   meclizine (ANTIVERT) 25 MG tablet Take 1 tablet (25 mg total) by mouth every 6 (six) hours as needed for dizziness. (Patient not taking: Reported on 12/17/2020) 60 tablet 0   Multiple Vitamin (MULTIVITAMIN WITH MINERALS) TABS tablet Take 1 tablet by mouth daily.     ondansetron (ZOFRAN) 8 MG tablet Take 1 tablet (8 mg total) by mouth every 8 (eight) hours as needed for nausea or vomiting. Starting day 3 after chemotherapy 30 tablet 2   oxyCODONE-acetaminophen (PERCOCET/ROXICET) 5-325 MG tablet Take 1 tablet by mouth every 6 (six) hours as needed for severe pain. 30 tablet 0   potassium chloride SA (KLOR-CON) 20 MEQ tablet Take 1 tablet (20 mEq total) by mouth daily. 6 tablet 0   predniSONE (DELTASONE) 10 MG tablet Take 1 tablet (10 mg total) by mouth daily with breakfast. 90 tablet 0   prochlorperazine (COMPAZINE) 10 MG tablet Take 1 tablet (10 mg total) by mouth every 6 (six) hours as needed. 30 tablet 2   No current facility-administered medications for this visit.   Facility-Administered Medications Ordered in Other Visits  Medication Dose Route Frequency Provider Last Rate Last Admin   0.9 %  sodium chloride infusion   Intravenous Continuous Curt Bears, MD   Stopped at 03/05/20 1410    SURGICAL HISTORY:  Past Surgical History:  Procedure Laterality Date   APPENDECTOMY     BRONCHIAL BIOPSY  01/07/2020   Procedure: BRONCHIAL BIOPSIES;  Surgeon: Collene Gobble, MD;  Location: [facility] ENDOSCOPY;  Service: Pulmonary;;   BRONCHIAL BIOPSY  04/05/2020   Procedure: BRONCHIAL BIOPSIES;  Surgeon: Rigoberto Noel, MD;  Location: [facility];  Service: Cardiopulmonary;;   BRONCHIAL BRUSHINGS  01/07/2020   Procedure: BRONCHIAL BRUSHINGS;  Surgeon: Collene Gobble, MD;  Location: [facility] ENDOSCOPY;  Service: Pulmonary;;   BRONCHIAL NEEDLE ASPIRATION BIOPSY  01/07/2020   Procedure: BRONCHIAL NEEDLE ASPIRATION BIOPSIES;  Surgeon: Collene Gobble, MD;  Location: [facility]  ENDOSCOPY;  Service: Pulmonary;;   BRONCHIAL WASHINGS  01/07/2020   Procedure: BRONCHIAL WASHINGS;  Surgeon: Collene Gobble, MD;  Location: [facility] ENDOSCOPY;  Service: Pulmonary;;   BRONCHIAL WASHINGS  04/05/2020   Procedure: BRONCHIAL WASHINGS;  Surgeon: Rigoberto Noel, MD;  Location: [facility];  Service: Cardiopulmonary;;   COLONOSCOPY  15 years ago   HEMOSTASIS CONTROL  01/07/2020   Procedure: HEMOSTASIS CONTROL;  Surgeon: Collene Gobble, MD;  Location: [facility] ENDOSCOPY;  Service: Pulmonary;;  cold saline   IR IMAGING GUIDED PORT INSERTION  09/18/2020   NASAL TURBINATE REDUCTION  2002   Dr.Crossley   VASECTOMY     VIDEO BRONCHOSCOPY Right 04/05/2020   Procedure: VIDEO BRONCHOSCOPY WITH FLUORO;  Surgeon: Rigoberto Noel, MD;  Location: [facility];  Service: Cardiopulmonary;  Laterality: Right;  VIDEO BRONCHOSCOPY WITH ENDOBRONCHIAL NAVIGATION N/A 01/07/2020   Procedure: VIDEO BRONCHOSCOPY WITH ENDOBRONCHIAL NAVIGATION;  Surgeon: Collene Gobble, MD;  Location: [facility] ENDOSCOPY;  Service: Pulmonary;  Laterality: N/A;   VIDEO BRONCHOSCOPY WITH ENDOBRONCHIAL ULTRASOUND N/A 01/07/2020   Procedure: VIDEO BRONCHOSCOPY WITH ENDOBRONCHIAL ULTRASOUND;  Surgeon: Collene Gobble, MD;  Location: [facility] ENDOSCOPY;  Service: Pulmonary;  Laterality: N/A;    REVIEW OF SYSTEMS:  A comprehensive review of systems was negative except for: Respiratory: positive for dyspnea on exertion and pleurisy/chest pain Musculoskeletal: positive for arthralgias   PHYSICAL EXAMINATION: General appearance: alert, cooperative, and no distress Head: Normocephalic, without obvious abnormality, atraumatic Neck: no adenopathy, no JVD, supple, symmetrical, trachea midline, and thyroid not enlarged, symmetric, no tenderness/mass/nodules Lymph nodes: Cervical, supraclavicular, and axillary nodes normal. Resp: clear to auscultation bilaterally Back: symmetric, no curvature. ROM normal. No CVA tenderness. Cardio: regular rate and rhythm, S1,  S2 normal, no murmur, click, rub or gallop GI: soft, non-tender; bowel sounds normal; no masses,  no organomegaly Extremities: extremities normal, atraumatic, no cyanosis or edema  ECOG PERFORMANCE STATUS: 1 - Symptomatic but completely ambulatory  Blood pressure 112/74, pulse (!) 110, temperature 98.2 F (36.8 C), temperature source Tympanic, resp. rate 19, height _0  (1.778 m), weight 192 lb 3.2 oz (87.2 kg), SpO2 99 %.  LABORATORY DATA: Lab Results  Component Value Date   WBC 6.2 01/28/2021   HGB 10.5 (L) 01/28/2021   HCT 30.3 (L) 01/28/2021   MCV 95.6 01/28/2021   PLT 284 01/28/2021      Chemistry      Component Value Date/Time   NA 140 01/07/2021 1036   K 3.3 (L) 01/07/2021 1036   CL 106 01/07/2021 1036   CO2 26 01/07/2021 1036   BUN 10 01/07/2021 1036   CREATININE 0.87 01/07/2021 1036   CREATININE 0.67 (L) 06/24/2020 1517      Component Value Date/Time   CALCIUM 9.3 01/07/2021 1036   ALKPHOS 89 01/07/2021 1036   AST 23 01/07/2021 1036   ALT 11 01/07/2021 1036   BILITOT 0.4 01/07/2021 1036       RADIOGRAPHIC STUDIES: MR Brain W Wo Contrast  Result Date: 01/16/2021 CLINICAL DATA:  Metastatic lung cancer, surveillance EXAM: MRI HEAD WITHOUT AND WITH CONTRAST TECHNIQUE: Multiplanar, multiecho pulse sequences of the brain and surrounding structures were obtained without and with intravenous contrast. CONTRAST:  103m MULTIHANCE GADOBENATE DIMEGLUMINE 529 MG/ML IV SOLN COMPARISON:  October 02, 2020 FINDINGS: Brain: Decrease in size of peripherally enhancing right cerebellar lesion now measuring 6 x 3 mm (previously 8 x 9 mm). Resolution of associated edema. The second punctate focus of enhancement within the right cerebellum on the prior study is no longer identified. Additional left frontal enhancement reported on the prior study is no longer identified. No new mass or abnormal enhancement. There is no acute infarction.  Ventricles are stable in size. Vascular: Major vessel  flow voids at the skull base are preserved. Skull and upper cervical spine: Normal marrow signal is preserved. Sinuses/Orbits: Minor mucosal thickening.  Orbits are unremarkable. Other: Sella is unremarkable.  Minor mastoid fluid opacification. IMPRESSION: Decrease in size of right cerebellar lesion. Additional foci of enhancement in the right cerebellum and left frontal lobe are no longer seen. No new lesion. Electronically Signed   By: PMacy MisM.D.   On: 01/16/2021 15:35     ASSESSMENT AND PLAN: This is a very pleasant 60years old white male with metastatic non-small cell lung cancer that was  initially diagnosed as stage IIb (T3, N0, M0) non-small cell lung cancer, adenocarcinoma presented with perihilar right upper lobe lung mass with suspicious groundglass opacity in the right middle lobe.  He had evidence for disease metastasis in March 2022 to the right kidney that was biopsy-proven in April 2022. The patient is not a good surgical candidate for resection according to Dr. Roxan Hockey. The patient underwent a course of concurrent chemoradiation with weekly carboplatin for AUC of 2 and paclitaxel 45 mg/M2 status post 5 cycles.  He has been tolerating this treatment well except for fatigue, mild odynophagia as well as recurrent pneumonia and septicemia.  The patient also has radiation-induced pneumonitis. He was admitted to the hospital several times with recurrent pneumonia.   The patient had evidence for metastatic disease in April 2022 and he is currently undergoing systemic chemotherapy with carboplatin for AUC of 5, Alimta 500 Mg/M2 and Keytruda 200 Mg IV every 3 weeks status post 6 cycles.  Starting from cycle #5 the patient is on maintenance treatment with Alimta and Keytruda every 3 weeks. The patient continues to tolerate this treatment well with no significant adverse effects. I recommended for him to proceed with cycle #7 today as planned. I will see him back for follow-up visit in 3  weeks for evaluation with repeat CT scan of the chest, abdomen and pelvis for restaging of his disease. For the tachycardia, he was advised to increase his oral intake and hydration. For pain management, he will continue his treatment with oxycodone on as-needed basis.  I will give him refill of his medication today. The patient was advised to call immediately if he has any concerning symptoms in the interval. The patient voices understanding of current disease status and treatment options and is in agreement with the current care plan.  All questions were answered. The patient knows to call the clinic with any problems, questions or concerns. We can certainly see the patient much sooner if necessary.  Disclaimer: This note was dictated with voice recognition software. Similar sounding words can inadvertently be transcribed and may not be corrected upon review.

## 2021-01-28 NOTE — Patient Instructions (Addendum)
[facility]  Discharge Instructions: Thank you for choosing [facility] to provide your oncology and hematology care.   If you have a lab appointment with the [facility], please go directly to the [facility] and check in at the registration area.   Wear comfortable clothing and clothing appropriate for easy access to any Portacath or PICC line.   We strive to give you quality time with your provider. You may need to reschedule your appointment if you arrive late (15 or more minutes).  Arriving late affects you and other patients whose appointments are after yours.  Also, if you miss three or more appointments without notifying the office, you may be dismissed from the clinic at the provider's discretion.      For prescription refill requests, have your pharmacy contact our office and allow 72 hours for refills to be completed.    Today you received the following chemotherapy and/or immunotherapy agents: Pembrolizumab (Keytruda), and Pemetrexed (Alimta).     To help prevent nausea and vomiting after your treatment, we encourage you to take your nausea medication as directed.  BELOW ARE SYMPTOMS THAT SHOULD BE REPORTED IMMEDIATELY: *FEVER GREATER THAN 100.4 F (38 C) OR HIGHER *CHILLS OR SWEATING *NAUSEA AND VOMITING THAT IS NOT CONTROLLED WITH YOUR NAUSEA MEDICATION *UNUSUAL SHORTNESS OF BREATH *UNUSUAL BRUISING OR BLEEDING *URINARY PROBLEMS (pain or burning when urinating, or frequent urination) *BOWEL PROBLEMS (unusual diarrhea, constipation, pain near the anus) TENDERNESS IN MOUTH AND THROAT WITH OR WITHOUT PRESENCE OF ULCERS (sore throat, sores in mouth, or a toothache) UNUSUAL RASH, SWELLING OR PAIN  UNUSUAL VAGINAL DISCHARGE OR ITCHING   Items with * indicate a potential emergency and should be followed up as soon as possible or go to the Emergency Department if any problems should occur.  Please show the CHEMOTHERAPY ALERT CARD or  IMMUNOTHERAPY ALERT CARD at check-in to the Emergency Department and triage nurse.  Should you have questions after your visit or need to cancel or reschedule your appointment, please contact [facility]  Dept: [phone]  and follow the prompts.  Office hours are 8:00 a.m. to 4:30 p.m. Monday - Friday. Please note that voicemails left after 4:00 p.m. may not be returned until the following business day.  We are closed weekends and major holidays. You have access to a nurse at all times for urgent questions. Please call the main number to the clinic Dept: [phone] and follow the prompts.   For any non-urgent questions, you may also contact your provider using MyChart. We now offer e-Visits for anyone 27 and older to request care online for non-urgent symptoms. For details visit [email or web address].   Also download the MyChart app! Go to the app store, search "MyChart", open the app, select [facility], and log in with your MyChart username and password.  Due to Covid, a mask is required upon entering the hospital/clinic. If you do not have a mask, one will be given to you upon arrival. For doctor visits, patients may have 1 support person aged 31 or older with them. For treatment visits, patients cannot have anyone with them due to current Covid guidelines and our immunocompromised population.   Rehydration, Adult Rehydration is the replacement of body fluids, salts, and minerals (electrolytes) that are lost during dehydration. Dehydration is when there is not enough water or other fluids in the body. This happens when you lose more fluids than you take in. Common causes of  dehydration include: Not drinking enough fluids. This can occur when you are ill or doing activities that require a lot of energy, especially in hot weather. Conditions that cause loss of water or other fluids, such as diarrhea, vomiting, sweating, or urinating a lot. Other illnesses, such as  fever or infection. Certain medicines, such as those that remove excess fluid from the body (diuretics). Symptoms of mild or moderate dehydration may include thirst, dry lips and mouth, and dizziness. Symptoms of severe dehydration may include increased heart rate, confusion, fainting, and not urinating. For severe dehydration, you may need to get fluids through an IV at the hospital. For mild or moderate dehydration, you can usually rehydrate at home by drinking certain fluids as told by your health care provider. What are the risks? Generally, rehydration is safe. However, taking in too much fluid (overhydration) can be a problem. This is rare. Overhydration can cause an electrolyte imbalance, kidney failure, or a decrease in salt (sodium) levels in the body. Supplies needed You will need an oral rehydration solution (ORS) if your health care provider tells you to use one. This is a drink to treat dehydration. It can be found in pharmacies and retail stores. How to rehydrate Fluids Follow instructions from your health care provider for rehydration. The kind of fluid and the amount you should drink depend on your condition. In general, you should choose drinks that you prefer. If told by your health care provider, drink an ORS. Make an ORS by following instructions on the package. Start by drinking small amounts, about  cup (120 mL) every 5-10 minutes. Slowly increase how much you drink until you have taken the amount recommended by your health care provider. Drink enough clear fluids to keep your urine pale yellow. If you were told to drink an ORS, finish it first, then start slowly drinking other clear fluids. Drink fluids such as: Water. This includes sparkling water and flavored water. Drinking only water can lead to having too little sodium in your body (hyponatremia). Follow the advice of your health care provider. Water from ice chips you suck on. Fruit juice with water you add to it  (diluted). Sports drinks. Hot or cold herbal teas. Broth-based soups. Milk or milk products. Food Follow instructions from your health care provider about what to eat while you rehydrate. Your health care provider may recommend that you slowly begin eating regular foods in small amounts. Eat foods that contain a healthy balance of electrolytes, such as bananas, oranges, potatoes, tomatoes, and spinach. Avoid foods that are greasy or contain a lot of sugar. In some cases, you may get nutrition through a feeding tube that is passed through your nose and into your stomach (nasogastric tube, or NG tube). This may be done if you have uncontrolled vomiting or diarrhea. Beverages to avoid Certain beverages may make dehydration worse. While you rehydrate, avoid drinking alcohol. How to tell if you are recovering from dehydration You may be recovering from dehydration if: You are urinating more often than before you started rehydrating. Your urine is pale yellow. Your energy level improves. You vomit less frequently. You have diarrhea less frequently. Your appetite improves or returns to normal. You feel less dizzy or less light-headed. Your skin tone and color start to look more normal. Follow these instructions at home: Take over-the-counter and prescription medicines only as told by your health care provider. Do not take sodium tablets. Doing this can lead to having too much sodium in your body (hypernatremia).  Contact a health care provider if: You continue to have symptoms of mild or moderate dehydration, such as: Thirst. Dry lips. Slightly dry mouth. Dizziness. Dark urine or less urine than normal. Muscle cramps. You continue to vomit or have diarrhea. Get help right away if you: Have symptoms of dehydration that get worse. Have a fever. Have a severe headache. Have been vomiting and the following happens: Your vomiting gets worse or does not go away. Your vomit includes blood or  green matter (bile). You cannot eat or drink without vomiting. Have problems with urination or bowel movements, such as: Diarrhea that gets worse or does not go away. Blood in your stool (feces). This may cause stool to look black and tarry. Not urinating, or urinating only a small amount of very dark urine, within 6-8 hours. Have trouble breathing. Have symptoms that get worse with treatment. These symptoms may represent a serious problem that is an emergency. Do not wait to see if the symptoms will go away. Get medical help right away. Call your local emergency services (911 in the [place] Do not drive yourself to the hospital. Summary Rehydration is the replacement of body fluids and minerals (electrolytes) that are lost during dehydration. Follow instructions from your health care provider for rehydration. The kind of fluid and amount you should drink depend on your condition. Slowly increase how much you drink until you have taken the amount recommended by your health care provider. Contact your health care provider if you continue to show signs of mild or moderate dehydration. This information is not intended to replace advice given to you by your health care provider. Make sure you discuss any questions you have with your health care provider. Document Revised: 06/12/2019 Document Reviewed: 04/22/2019 Elsevier Patient Education  2022 [employer].

## 2021-01-29 ENCOUNTER — Telehealth: Payer: Self-pay | Admitting: Internal Medicine

## 2021-01-29 NOTE — Telephone Encounter (Signed)
Scheduled follow-up appointment per 10/6 los. Patient is aware.

## 2021-02-04 ENCOUNTER — Encounter: Payer: Self-pay | Admitting: Internal Medicine

## 2021-02-04 ENCOUNTER — Other Ambulatory Visit: Payer: Self-pay | Admitting: Medical Oncology

## 2021-02-04 DIAGNOSIS — K5900 Constipation, unspecified: Secondary | ICD-10-CM

## 2021-02-04 MED ORDER — LACTULOSE 10 GM/15ML PO SOLN: 10.0000 g | Freq: Three times a day (TID) | ORAL | 0 refills | Status: DC

## 2021-02-11 ENCOUNTER — Telehealth: Payer: Self-pay | Admitting: Pulmonary Disease

## 2021-02-11 NOTE — Telephone Encounter (Signed)
We rec'd a faxed request from insurance for office visit notes to support the patient's disability claim.  I faxed OV notes from 05/12/20, 06/05/20, 07/27/20 and 09/30/20 (54 pages total) to [facility] fax# [phone]

## 2021-02-15 ENCOUNTER — Encounter (HOSPITAL_COMMUNITY): Payer: Self-pay

## 2021-02-15 ENCOUNTER — Other Ambulatory Visit: Payer: Self-pay | Admitting: Internal Medicine

## 2021-02-15 ENCOUNTER — Ambulatory Visit (HOSPITAL_COMMUNITY)
Admission: RE | Admit: 2021-02-15 | Discharge: 2021-02-15 | Disposition: A | Discharge status: Home / Self Care | Payer: BC Managed Care – PPO | Source: Ambulatory Visit | Attending: Internal Medicine | Admitting: Internal Medicine

## 2021-02-15 DIAGNOSIS — C3490 Malignant neoplasm of unspecified part of unspecified bronchus or lung: Secondary | ICD-10-CM | POA: Diagnosis not present

## 2021-02-15 DIAGNOSIS — C3491 Malignant neoplasm of unspecified part of right bronchus or lung: Secondary | ICD-10-CM

## 2021-02-15 IMAGING — CT CT ABD-PELV W/ CM
2 of 5 series · 12 of 36 positions shown, 15 images · IV contrast (omnipaque)
Comparison: Comparison made with imaging from [facility] and [DATE] (CT evaluations), also with prior PET scan from [DATE].

CLINICAL DATA: A 60-year-old male presents with history of
non-small cell lung cancer diagnosed in [B0]. Post chemo and
radiotherapy, currently on systemic therapy.

EXAM:
CT CHEST, ABDOMEN, AND PELVIS WITH CONTRAST
TECHNIQUE: Multidetector CT imaging of the chest, abdomen and pelvis was
performed following the standard protocol during bolus
administration of intravenous contrast.
CONTRAST:  75mL OMNIPAQUE IOHEXOL 350 MG/ML SOLN

[Series 2: cap with · axial · 0.86mm/px · z∈[-683,-98]mm · 9 of 147 slices shown, 12 images]
[im 15/147  mediastinal]
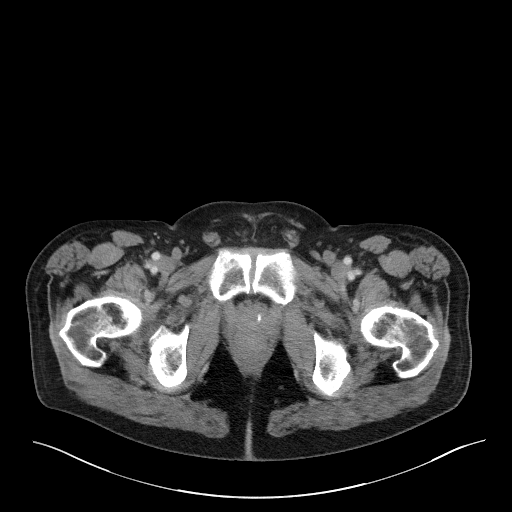
[im 15/147  lung]
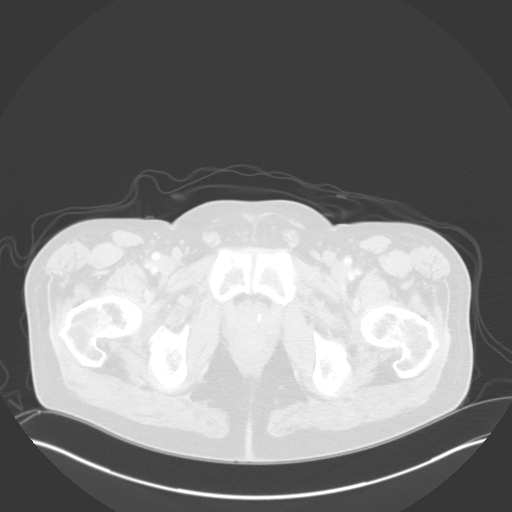
[im 30/147  lung]
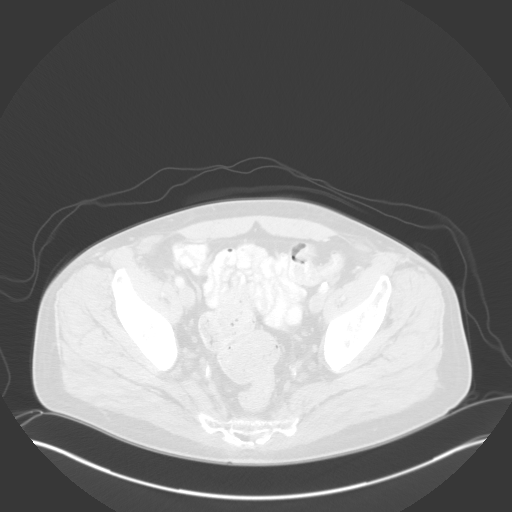
[im 44/147  lung]
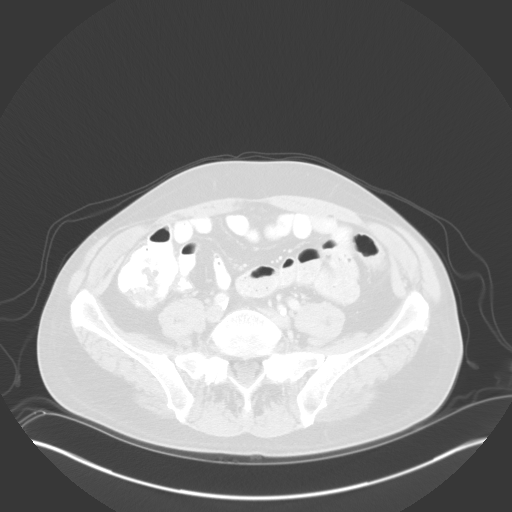
[im 59/147  lung]
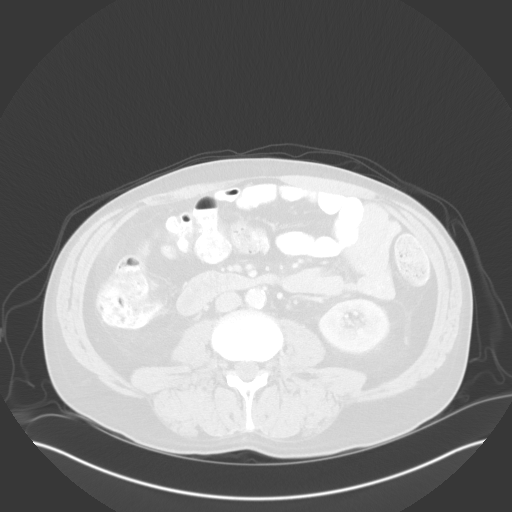
[im 74/147  mediastinal]
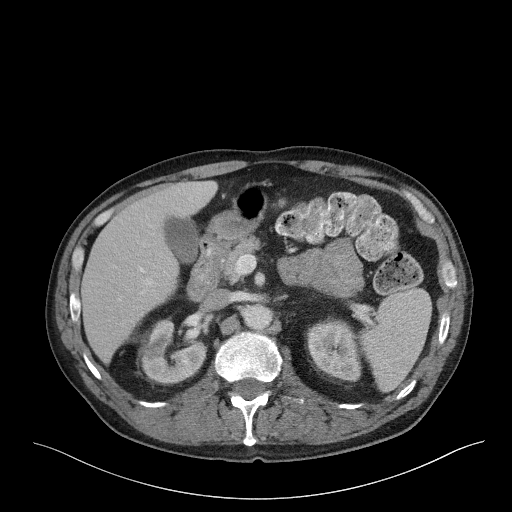
[im 74/147  lung]
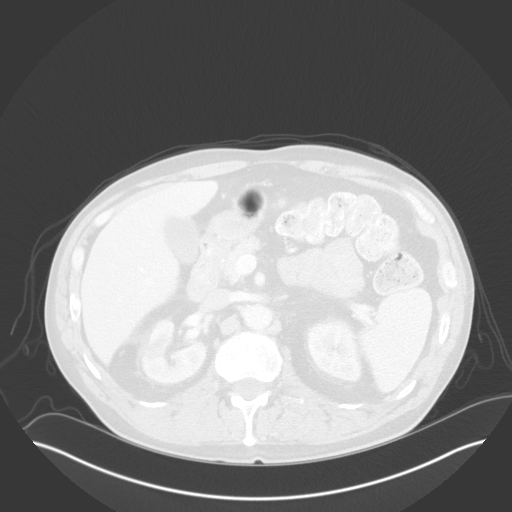
[im 88/147  lung]
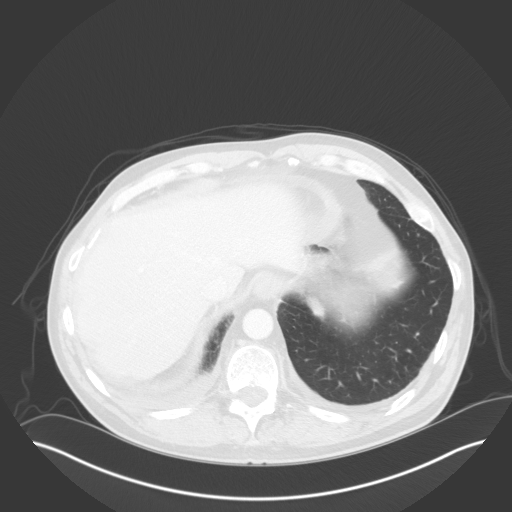
[im 103/147  lung]
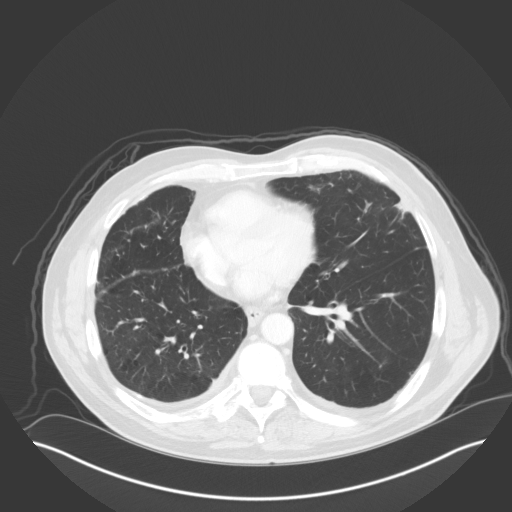
[im 117/147  lung]
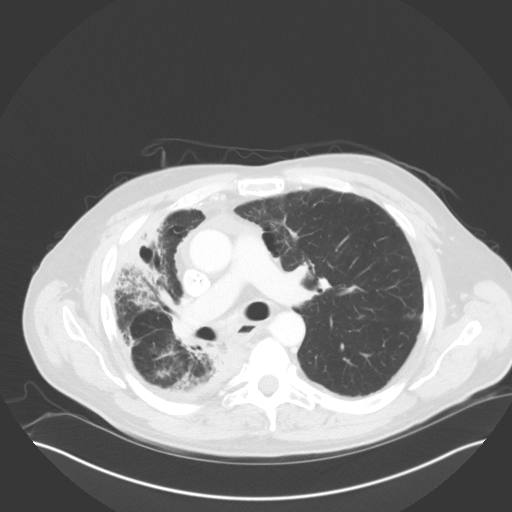
[im 132/147  mediastinal]
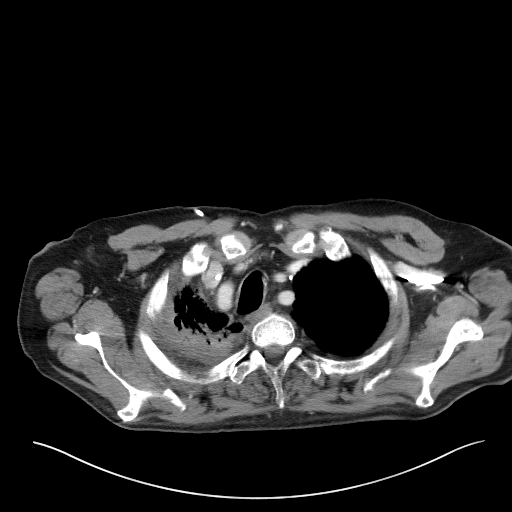
[im 132/147  lung]
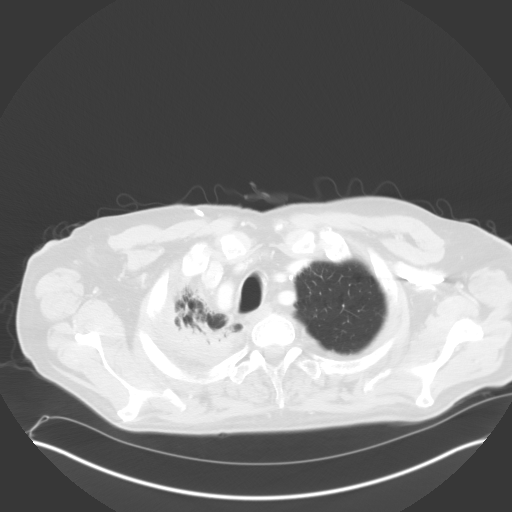

[Series 4: coronals · coronal · 0.88mm/px · 3 of 155 slices shown]
[im 31/155  lung]
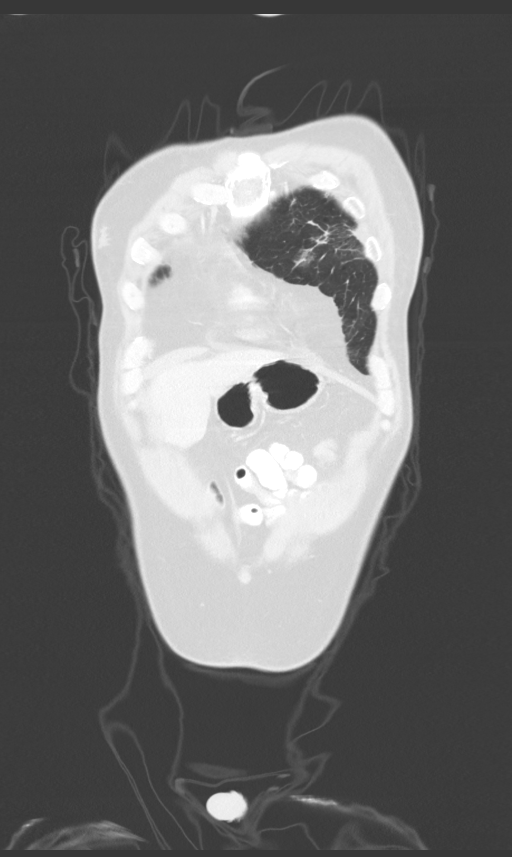
[im 62/155  lung]
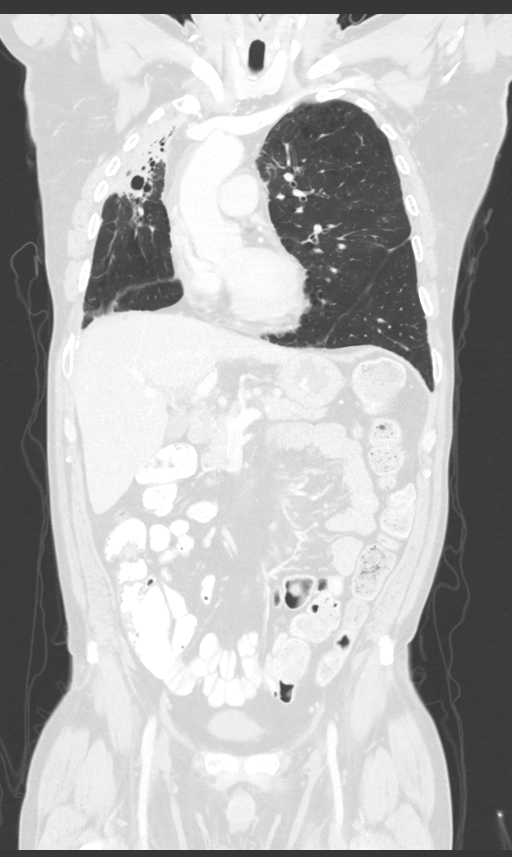
[im 93/155  lung]
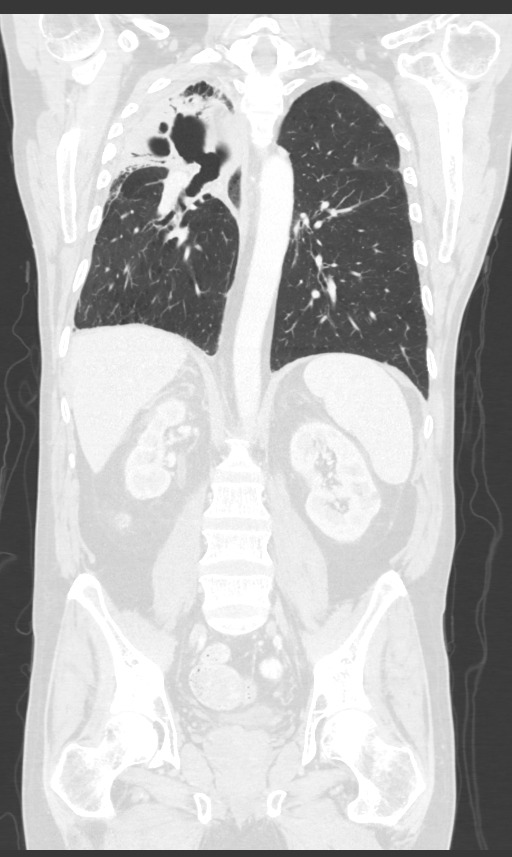

[12 of 36 positions shown; findings below may reference images not displayed]

FINDINGS: CT CHEST FINDINGS

Cardiovascular: RIGHT-sided Port-A-Cath terminates at the caval to
atrial junction as before. Heart size is stable without signs of
pericardial effusion of substantial volume, small amount of
pericardial fluid is similar to the prior exam. Central pulmonary
vasculature is unremarkable.

Mediastinum/Nodes: No adenopathy at the thoracic inlet or in the
bilateral axillary regions.

No mediastinal adenopathy. No hilar adenopathy. Soft tissue
extending from the RIGHT hilum peripherally compatible with post
treatment changes, similar to previous imaging

Lungs/Pleura:

Volume loss, bronchiectasis and cystic changes in the RIGHT upper
lobe similar to previous imaging.

Interval development of nodular area in the LEFT upper lobe (image
95/6) this measures 2.4 x 1.1 cm. Bandlike changes associated with
nodularity elsewhere within this area.

Nodular area in the LEFT upper lobe anteriorly and medially (image
68/6) site of previous ground-glass on the prior study now measuring
11 x 9 mm.

Stable LEFT lower lobe pulmonary nodule (image 107/6) 7 mm.

Nodularity extending into the extrapleural fat of the RIGHT upper
chest is similar to previous imaging.

Musculoskeletal: See below for full musculoskeletal details.

CT ABDOMEN PELVIS FINDINGS

Hepatobiliary: No focal, suspicious hepatic lesion. No
pericholecystic stranding. No biliary duct dilation. Portal vein is
patent.

Pancreas: Normal, without mass, inflammation or ductal dilatation.

Spleen: Spleen normal size and contour.

Adrenals/Urinary Tract: Adrenal glands are normal.

Ill-defined area in the RIGHT kidney measuring 2.0 x 1.4 cm
previously approximately 2.4 x 1.9 cm. Urinary bladder under
distended but without gross abnormality.

Stomach/Bowel: No acute gastrointestinal findings.

Vascular/Lymphatic:

Aortic atherosclerosis. No sign of aneurysm. Smooth contour of the
IVC. There is no gastrohepatic or hepatoduodenal ligament
lymphadenopathy. No retroperitoneal or mesenteric lymphadenopathy.

No pelvic sidewall lymphadenopathy.

Reproductive: Unremarkable by CT.

Other: No ascites.

Musculoskeletal: Subtle sclerosis in LEFT posterior iliac is
unchanged. No new suspicious bone lesions though again bony
metastases were largely occult on previous imaging. Unchanged
appearance of T12 compression fracture
IMPRESSION: Interval development of nodular area in the LEFT upper lobe this
measures 2.4 x 1.1 cm. This finding is concerning for metastatic
disease. Adjacent pleural thickening in this area in the setting of
prior radiotherapy makes this area indeterminate with rapid
development. Linear border of this on axial images but without
bandlike appearance on coronal and sagittal images. Perhaps related
to post radiation changes and associated with adjacent nodularity
and bandlike changes. Short interval follow-up may be helpful to
distinguish from metastatic process. PET scan could also be
considered.

Nodular area in the LEFT upper lobe anteriorly and medially site of
previous ground-glass on the prior study now measuring 11 x 9 mm.
This too may relate to post treatment changes but is suspicious
given patient history and appearance.

Lesion in the RIGHT kidney with similar appearance to recent imaging
and with diminished size compared to prior studies from [DATE].

No evidence of worsening bony metastatic disease with subtle
sclerotic lesions and unchanged appearance of T12 compression
fracture.

Aortic atherosclerosis.

## 2021-02-15 MED ORDER — IOHEXOL 350 MG/ML SOLN
75.0000 mL | Freq: Once | INTRAVENOUS | Status: AC | PRN
  Administered 2021-02-15: 75 mL via INTRAVENOUS

## 2021-02-16 ENCOUNTER — Other Ambulatory Visit: Payer: Self-pay

## 2021-02-16 ENCOUNTER — Encounter: Payer: Self-pay | Admitting: Pulmonary Disease

## 2021-02-16 ENCOUNTER — Ambulatory Visit: Payer: BC Managed Care – PPO | Admitting: Pulmonary Disease

## 2021-02-16 VITALS — BP 108/62 | HR 87 | Temp 99.8°F | Ht 70.0 in | Wt 194.4 lb

## 2021-02-16 DIAGNOSIS — C3491 Malignant neoplasm of unspecified part of right bronchus or lung: Secondary | ICD-10-CM

## 2021-02-16 DIAGNOSIS — Z23 Encounter for immunization: Secondary | ICD-10-CM | POA: Diagnosis not present

## 2021-02-16 NOTE — Patient Instructions (Addendum)
Nice to see you  Stop doxycycline.  If cough and phlegm production is stable we will plan to slowly decrease prednisone in about 3-4 weeks. Please call me in a couple of weeks and let me know how things are off the doxycycline.  IF things get worse please let me know sooner!  Return to clinic in 3 months or sooner  as needed

## 2021-02-18 ENCOUNTER — Inpatient Hospital Stay: Payer: BC Managed Care – PPO

## 2021-02-18 ENCOUNTER — Inpatient Hospital Stay (HOSPITAL_BASED_OUTPATIENT_CLINIC_OR_DEPARTMENT_OTHER): Payer: BC Managed Care – PPO | Admitting: Internal Medicine

## 2021-02-18 ENCOUNTER — Other Ambulatory Visit: Payer: Self-pay

## 2021-02-18 ENCOUNTER — Encounter: Payer: Self-pay | Admitting: Internal Medicine

## 2021-02-18 DIAGNOSIS — E860 Dehydration: Secondary | ICD-10-CM

## 2021-02-18 DIAGNOSIS — C3491 Malignant neoplasm of unspecified part of right bronchus or lung: Secondary | ICD-10-CM

## 2021-02-18 DIAGNOSIS — Z5111 Encounter for antineoplastic chemotherapy: Secondary | ICD-10-CM | POA: Diagnosis not present

## 2021-02-18 DIAGNOSIS — K5900 Constipation, unspecified: Secondary | ICD-10-CM | POA: Diagnosis not present

## 2021-02-18 DIAGNOSIS — D649 Anemia, unspecified: Secondary | ICD-10-CM

## 2021-02-18 LAB — CMP (CANCER CENTER ONLY)
ALT: 11 U/L (ref 0–44)
AST: 22 U/L (ref 15–41)
Albumin: 3.3 g/dL — ABNORMAL LOW (ref 3.5–5.0)
Alkaline Phosphatase: 80 U/L (ref 38–126)
Anion gap: 10 (ref 5–15)
BUN: 11 mg/dL (ref 6–20)
CO2: 23 mmol/L (ref 22–32)
Calcium: 9.3 mg/dL (ref 8.9–10.3)
Chloride: 106 mmol/L (ref 98–111)
Creatinine: 1 mg/dL (ref 0.61–1.24)
GFR, Estimated: >60 mL/min (ref >60)
Glucose, Bld: 94 mg/dL (ref 70–99)
Potassium: 3.5 mmol/L (ref 3.5–5.1)
Sodium: 139 mmol/L (ref 135–145)
Total Bilirubin: 0.5 mg/dL (ref 0.3–1.2)
Total Protein: 6.7 g/dL (ref 6.5–8.1)

## 2021-02-18 LAB — CBC WITH DIFFERENTIAL (CANCER CENTER ONLY)
Abs Immature Granulocytes: 0.04 10*3/uL (ref 0.00–0.07)
Basophils Absolute: 0 10*3/uL (ref 0.0–0.1)
Basophils Relative: 0 %
Eosinophils Absolute: 0.1 10*3/uL (ref 0.0–0.5)
Eosinophils Relative: 1 %
HCT: 29.9 % — ABNORMAL LOW (ref 39.0–52.0)
Hemoglobin: 10.3 g/dL — ABNORMAL LOW (ref 13.0–17.0)
Immature Granulocytes: 1 %
Lymphocytes Relative: 6 %
Lymphs Abs: 0.4 10*3/uL — ABNORMAL LOW (ref 0.7–4.0)
MCH: 33.4 pg (ref 26.0–34.0)
MCHC: 34.4 g/dL (ref 30.0–36.0)
MCV: 97.1 fL (ref 80.0–100.0)
Monocytes Absolute: 1.1 10*3/uL — ABNORMAL HIGH (ref 0.1–1.0)
Monocytes Relative: 15 %
Neutro Abs: 5.8 10*3/uL (ref 1.7–7.7)
Neutrophils Relative %: 77 %
Platelet Count: 220 10*3/uL (ref 150–400)
RBC: 3.08 MIL/uL — ABNORMAL LOW (ref 4.22–5.81)
RDW: 16.5 % — ABNORMAL HIGH (ref 11.5–15.5)
WBC Count: 7.5 10*3/uL (ref 4.0–10.5)
nRBC: 0 % (ref 0.0–0.2)

## 2021-02-18 LAB — TSH: TSH: 1.954 u[IU]/mL (ref 0.320–4.118)

## 2021-02-18 MED ORDER — SODIUM CHLORIDE 0.9 % IV SOLN: Freq: Once | INTRAVENOUS | Status: AC

## 2021-02-18 MED ORDER — SODIUM CHLORIDE 0.9 % IV SOLN
500.0000 mg/m2 | Freq: Once | INTRAVENOUS | Status: AC
  Administered 2021-02-18: 1000 mg via INTRAVENOUS
  Filled 2021-02-18: qty 40

## 2021-02-18 MED ORDER — SODIUM CHLORIDE 0.9 % IV SOLN
200.0000 mg | Freq: Once | INTRAVENOUS | Status: AC
  Administered 2021-02-18: 200 mg via INTRAVENOUS
  Filled 2021-02-18: qty 8

## 2021-02-18 MED ORDER — SODIUM CHLORIDE 0.9% FLUSH
10.0000 mL | INTRAVENOUS | Status: DC | PRN
  Administered 2021-02-18: 10 mL

## 2021-02-18 MED ORDER — LACTULOSE 10 GM/15ML PO SOLN: 10.0000 g | Freq: Three times a day (TID) | ORAL | 0 refills | Status: DC

## 2021-02-18 MED ORDER — SODIUM CHLORIDE 0.9% FLUSH
10.0000 mL | Freq: Once | INTRAVENOUS | Status: AC | PRN
  Administered 2021-02-18: 10 mL

## 2021-02-18 MED ORDER — CYANOCOBALAMIN 1000 MCG/ML IJ SOLN
1000.0000 ug | Freq: Once | INTRAMUSCULAR | Status: AC
  Administered 2021-02-18: 1000 ug via INTRAMUSCULAR
  Filled 2021-02-18: qty 1

## 2021-02-18 MED ORDER — PROCHLORPERAZINE MALEATE 10 MG PO TABS
10.0000 mg | ORAL_TABLET | Freq: Once | ORAL | Status: AC
  Administered 2021-02-18: 10 mg via ORAL
  Filled 2021-02-18: qty 1

## 2021-02-18 MED ORDER — HEPARIN SOD (PORK) LOCK FLUSH 100 UNIT/ML IV SOLN
500.0000 [IU] | Freq: Once | INTRAVENOUS | Status: AC | PRN
  Administered 2021-02-18: 500 [IU]

## 2021-02-18 NOTE — Progress Notes (Signed)
[facility] [phone]   [phone]) [phone]  OFFICE PROGRESS NOTE  [name], DO [place]  [address] [place] [address]  DIAGNOSIS: Metastatic non-small cell lung cancer initially diagnosed as stage IIB (T3, N0, M0) non-small cell lung cancer, adenocarcinoma presented with large central perihilar mass with suspicious groundglass opacity in the right middle lobe and left upper lobe diagnosed in September 2021.  The patient had evidence of metastatic disease to the right kidney in March 2022.  Molecular studies by guardant 360: No actionable mutations.   PDl1: 80%   PRIOR THERAPY: Concurrent chemoradiation with weekly carboplatin for AUC of 2 and paclitaxel 45 mg/M2.  First dose February 10, 2020.  Status post 5 cycles.   CURRENT THERAPY:  1) Palliative systemic chemotherapy with carboplatin AUC of 5, Alimta 500 mg/M2, Keytruda 200 mg IV every 3 weeks.  First dose expected on 09/24/2020. Status post 7 cycles.  Starting from cycle #5 the patient is on maintenance treatment with Alimta and Keytruda every 3 weeks. 2) Palliative radiation to the left anterior rib cage area as well as the mid back area under the care of Dr. Sondra Come. Last treatment on 10/05/20.  INTERVAL HISTORY: UPTON RUSSEY 60 y.o. male returns to the clinic today for follow-up visit.  The patient is feeling fine today with no concerning complaints except for constipation.  His last bowel movement was 3 days ago.  He was treated with lactulose and he has significant improvement in his constipation but he is running out of his medication.  He also has occasional pain in his left iliac bone area and also under the left breast.  He takes Tylenol and it does help his pain.  He denied having any shortness of breath but continues to have cough productive of whitish sputum with no hemoptysis.  He has no nausea, vomiting, abdominal pain or diarrhea.  He denied having any fever or  chills.  He has no headache or visual changes.  He has been tolerating his treatment with maintenance Alimta and Keytruda fairly well.  The patient is here today for evaluation with repeat CT scan of the chest, abdomen and pelvis for restaging of his disease.   MEDICAL HISTORY: Past Medical History:  Diagnosis Date   Allergic rhinitis, cause unspecified    Anxiety state, unspecified    Asthma    as a child   Chronic airway obstruction, not elsewhere classified    Esophageal reflux    History of kidney stones    History of radiation therapy 02/13/2020-03/24/2020   right lung        Dr Gery Pray   Hypertension    Lumbago    lung ca dx'd 12/2019   Other and unspecified hyperlipidemia    Other chest pain     ALLERGIES:  is allergic to penicillins and amoxicillin.  MEDICATIONS:  Current Outpatient Medications  Medication Sig Dispense Refill   acetaminophen (TYLENOL) 500 MG tablet Take 1,000 mg by mouth 2 (two) times daily.     Brimonidine Tartrate (LUMIFY) 0.025 % SOLN Place 1 drop into both eyes 2 (two) times a week. As needed (Patient not taking: Reported on 02/16/2021)     COMBIVENT RESPIMAT 20-100 MCG/ACT AERS respimat INHALE 1 TO 2 PUFFS BY MOUTH EVERY 6 HOURS AS NEEDED FOR WHEEZING 4 g 5   dextromethorphan-guaiFENesin (MUCINEX DM) 30-600 MG 12hr tablet Take 1 tablet by mouth 2 (two) times daily.  feeding supplement (ENSURE ENLIVE / ENSURE PLUS) LIQD Take 237 mLs by mouth 3 (three) times daily between meals. (Patient taking differently: Take 237 mLs by mouth 2 (two) times daily between meals.) 237 mL 12   fenofibrate 160 MG tablet Take 1 tablet by mouth once daily (Patient taking differently: Take 160 mg by mouth daily.) 90 tablet 3   ferrous sulfate 325 (65 FE) MG tablet Take 1 tablet (325 mg total) by mouth daily with breakfast. 30 tablet 0   folic acid (FOLVITE) 1 MG tablet Take 1 tablet by mouth once daily 30 tablet 0   lactulose (CHRONULAC) 10 GM/15ML solution Take 15 mLs  (10 g total) by mouth 3 (three) times daily. 236 mL 0   lidocaine-prilocaine (EMLA) cream Apply 1 application topically as needed. 30 g 2   LORazepam (ATIVAN) 1 MG tablet Take 1 tablet (1 mg total) by mouth every 8 (eight) hours as needed for anxiety. 90 tablet 5   meclizine (ANTIVERT) 25 MG tablet Take 1 tablet (25 mg total) by mouth every 6 (six) hours as needed for dizziness. 60 tablet 0   Multiple Vitamin (MULTIVITAMIN WITH MINERALS) TABS tablet Take 1 tablet by mouth daily.     ondansetron (ZOFRAN) 8 MG tablet Take 1 tablet (8 mg total) by mouth every 8 (eight) hours as needed for nausea or vomiting. Starting day 3 after chemotherapy 30 tablet 2   oxyCODONE-acetaminophen (PERCOCET/ROXICET) 5-325 MG tablet Take 1 tablet by mouth every 6 (six) hours as needed for severe pain. 30 tablet 0   potassium chloride SA (KLOR-CON) 20 MEQ tablet Take 1 tablet (20 mEq total) by mouth daily. (Patient not taking: Reported on 02/16/2021) 5 tablet 0   predniSONE (DELTASONE) 10 MG tablet Take 1 tablet (10 mg total) by mouth daily with breakfast. 90 tablet 0   prochlorperazine (COMPAZINE) 10 MG tablet Take 1 tablet (10 mg total) by mouth every 6 (six) hours as needed. 30 tablet 2   No current facility-administered medications for this visit.   Facility-Administered Medications Ordered in Other Visits  Medication Dose Route Frequency Provider Last Rate Last Admin   0.9 %  sodium chloride infusion   Intravenous Continuous Curt Bears, MD   Stopped at 03/05/20 1410    SURGICAL HISTORY:  Past Surgical History:  Procedure Laterality Date   APPENDECTOMY     BRONCHIAL BIOPSY  01/07/2020   Procedure: BRONCHIAL BIOPSIES;  Surgeon: Collene Gobble, MD;  Location: [facility];  Service: Pulmonary;;   BRONCHIAL BIOPSY  04/05/2020   Procedure: BRONCHIAL BIOPSIES;  Surgeon: Rigoberto Noel, MD;  Location: [facility];  Service: Cardiopulmonary;;   BRONCHIAL BRUSHINGS  01/07/2020   Procedure: BRONCHIAL BRUSHINGS;   Surgeon: Collene Gobble, MD;  Location: [facility] ENDOSCOPY;  Service: Pulmonary;;   BRONCHIAL NEEDLE ASPIRATION BIOPSY  01/07/2020   Procedure: BRONCHIAL NEEDLE ASPIRATION BIOPSIES;  Surgeon: Collene Gobble, MD;  Location: [facility];  Service: Pulmonary;;   BRONCHIAL WASHINGS  01/07/2020   Procedure: BRONCHIAL WASHINGS;  Surgeon: Collene Gobble, MD;  Location[facility] ENDOSCOPY;  Service: Pulmonary;;   BRONCHIAL WASHINGS  04/05/2020   Procedure: BRONCHIAL WASHINGS;  Surgeon: Rigoberto Noel, MD;  Location: [place];  Service: Cardiopulmonary;;   COLONOSCOPY  15 years ago   HEMOSTASIS CONTROL  01/07/2020   Procedure: HEMOSTASIS CONTROL;  Surgeon: Collene Gobble, MD;  Location: [facility] ENDOSCOPY;  Service: Pulmonary;;  cold saline   IR IMAGING GUIDED PORT INSERTION  09/18/2020   NASAL TURBINATE REDUCTION  2002   Dr.Crossley   VASECTOMY     VIDEO BRONCHOSCOPY Right 04/05/2020   Procedure: VIDEO BRONCHOSCOPY WITH FLUORO;  Surgeon: Rigoberto Noel, MD;  Location: [place];  Service: Cardiopulmonary;  Laterality: Right;   VIDEO BRONCHOSCOPY WITH ENDOBRONCHIAL NAVIGATION N/A 01/07/2020   Procedure: VIDEO BRONCHOSCOPY WITH ENDOBRONCHIAL NAVIGATION;  Surgeon: Collene Gobble, MD;  Location: [facility] ENDOSCOPY;  Service: Pulmonary;  Laterality: N/A;   VIDEO BRONCHOSCOPY WITH ENDOBRONCHIAL ULTRASOUND N/A 01/07/2020   Procedure: VIDEO BRONCHOSCOPY WITH ENDOBRONCHIAL ULTRASOUND;  Surgeon: Collene Gobble, MD;  Location: [place] ENDOSCOPY;  Service: Pulmonary;  Laterality: N/A;    REVIEW OF SYSTEMS:  Constitutional: positive for fatigue Eyes: negative Ears, nose, mouth, throat, and face: negative Respiratory: positive for cough, dyspnea on exertion, and pleurisy/chest pain Cardiovascular: negative Gastrointestinal: positive for constipation Genitourinary:negative Integument/breast: negative Hematologic/lymphatic: negative Musculoskeletal:negative Neurological: negative Behavioral/Psych: negative Endocrine:  negative Allergic/Immunologic: negative   PHYSICAL EXAMINATION: General appearance: alert, cooperative, fatigued, and no distress Head: Normocephalic, without obvious abnormality, atraumatic Neck: no adenopathy, no JVD, supple, symmetrical, trachea midline, and thyroid not enlarged, symmetric, no tenderness/mass/nodules Lymph nodes: Cervical, supraclavicular, and axillary nodes normal. Resp: clear to auscultation bilaterally Back: symmetric, no curvature. ROM normal. No CVA tenderness. Cardio: regular rate and rhythm, S1, S2 normal, no murmur, click, rub or gallop GI: soft, non-tender; bowel sounds normal; no masses,  no organomegaly Extremities: extremities normal, atraumatic, no cyanosis or edema Neurologic: Alert and oriented X 3, normal strength and tone. Normal symmetric reflexes. Normal coordination and gait  ECOG PERFORMANCE STATUS: 1 - Symptomatic but completely ambulatory  Blood pressure 122/75, pulse 98, temperature 98.1 F (36.7 C), temperature source Tympanic, resp. rate 20, height $RemoveBe'5\' 10"'jbGiETQmV$  (1.778 m), weight 197 lb 3.2 oz (89.4 kg), SpO2 100 %.  LABORATORY DATA: Lab Results  Component Value Date   WBC 7.5 02/18/2021   HGB 10.3 (L) 02/18/2021   HCT 29.9 (L) 02/18/2021   MCV 97.1 02/18/2021   PLT 220 02/18/2021      Chemistry      Component Value Date/Time   NA 139 01/28/2021 0905   K 3.2 (L) 01/28/2021 0905   CL 106 01/28/2021 0905   CO2 23 01/28/2021 0905   BUN 20 01/28/2021 0905   CREATININE 1.07 01/28/2021 0905   CREATININE 0.67 (L) 06/24/2020 1517      Component Value Date/Time   CALCIUM 9.4 01/28/2021 0905   ALKPHOS 94 01/28/2021 0905   AST 26 01/28/2021 0905   ALT 12 01/28/2021 0905   BILITOT 0.5 01/28/2021 0905       RADIOGRAPHIC STUDIES: CT Chest W Contrast  Result Date: 02/16/2021 CLINICAL DATA:  A 60 year old male presents with history of non-small cell lung cancer diagnosed in 2021. Post chemo and radiotherapy, currently on systemic therapy.  EXAM: CT CHEST, ABDOMEN, AND PELVIS WITH CONTRAST TECHNIQUE: Multidetector CT imaging of the chest, abdomen and pelvis was performed following the standard protocol during bolus administration of intravenous contrast. CONTRAST:  86mL OMNIPAQUE IOHEXOL 350 MG/ML SOLN COMPARISON:  Comparison made with imaging from August and June of 2022 (CT evaluations), also with prior PET scan from May of 2022. FINDINGS: CT CHEST FINDINGS Cardiovascular: RIGHT-sided Port-A-Cath terminates at the caval to atrial junction as before. Heart size is stable without signs of pericardial effusion of substantial volume, small amount of pericardial fluid is similar to the prior exam. Central pulmonary vasculature is unremarkable. Mediastinum/Nodes: No adenopathy at the thoracic inlet or in the bilateral axillary regions. No mediastinal adenopathy. No hilar  adenopathy. Soft tissue extending from the RIGHT hilum peripherally compatible with post treatment changes, similar to previous imaging Lungs/Pleura: Volume loss, bronchiectasis and cystic changes in the RIGHT upper lobe similar to previous imaging. Interval development of nodular area in the LEFT upper lobe (image 95/6) this measures 2.4 x 1.1 cm. Bandlike changes associated with nodularity elsewhere within this area. Nodular area in the LEFT upper lobe anteriorly and medially (image 68/6) site of previous ground-glass on the prior study now measuring 11 x 9 mm. Stable LEFT lower lobe pulmonary nodule (image 107/6) 7 mm. Nodularity extending into the extrapleural fat of the RIGHT upper chest is similar to previous imaging. Musculoskeletal: See below for full musculoskeletal details. CT ABDOMEN PELVIS FINDINGS Hepatobiliary: No focal, suspicious hepatic lesion. No pericholecystic stranding. No biliary duct dilation. Portal vein is patent. Pancreas: Normal, without mass, inflammation or ductal dilatation. Spleen: Spleen normal size and contour. Adrenals/Urinary Tract: Adrenal glands are  normal. Ill-defined area in the RIGHT kidney measuring 2.0 x 1.4 cm previously approximately 2.4 x 1.9 cm. Urinary bladder under distended but without gross abnormality. Stomach/Bowel: No acute gastrointestinal findings. Vascular/Lymphatic: Aortic atherosclerosis. No sign of aneurysm. Smooth contour of the IVC. There is no gastrohepatic or hepatoduodenal ligament lymphadenopathy. No retroperitoneal or mesenteric lymphadenopathy. No pelvic sidewall lymphadenopathy. Reproductive: Unremarkable by CT. Other: No ascites. Musculoskeletal: Subtle sclerosis in LEFT posterior iliac is unchanged. No new suspicious bone lesions though again bony metastases were largely occult on previous imaging. Unchanged appearance of T12 compression fracture IMPRESSION: Interval development of nodular area in the LEFT upper lobe this measures 2.4 x 1.1 cm. This finding is concerning for metastatic disease. Adjacent pleural thickening in this area in the setting of prior radiotherapy makes this area indeterminate with rapid development. Linear border of this on axial images but without bandlike appearance on coronal and sagittal images. Perhaps related to post radiation changes and associated with adjacent nodularity and bandlike changes. Short interval follow-up may be helpful to distinguish from metastatic process. PET scan could also be considered. Nodular area in the LEFT upper lobe anteriorly and medially site of previous ground-glass on the prior study now measuring 11 x 9 mm. This too may relate to post treatment changes but is suspicious given patient history and appearance. Lesion in the RIGHT kidney with similar appearance to recent imaging and with diminished size compared to prior studies from May of 2022. No evidence of worsening bony metastatic disease with subtle sclerotic lesions and unchanged appearance of T12 compression fracture. Aortic atherosclerosis. Electronically Signed   By: Zetta Bills M.D.   On: 02/16/2021 16:27    CT Abdomen Pelvis W Contrast  Result Date: 02/16/2021 CLINICAL DATA:  A 60 year old male presents with history of non-small cell lung cancer diagnosed in 2021. Post chemo and radiotherapy, currently on systemic therapy. EXAM: CT CHEST, ABDOMEN, AND PELVIS WITH CONTRAST TECHNIQUE: Multidetector CT imaging of the chest, abdomen and pelvis was performed following the standard protocol during bolus administration of intravenous contrast. CONTRAST:  57mL OMNIPAQUE IOHEXOL 350 MG/ML SOLN COMPARISON:  Comparison made with imaging from August and June of 2022 (CT evaluations), also with prior PET scan from May of 2022. FINDINGS: CT CHEST FINDINGS Cardiovascular: RIGHT-sided Port-A-Cath terminates at the caval to atrial junction as before. Heart size is stable without signs of pericardial effusion of substantial volume, small amount of pericardial fluid is similar to the prior exam. Central pulmonary vasculature is unremarkable. Mediastinum/Nodes: No adenopathy at the thoracic inlet or in the bilateral axillary regions.  No mediastinal adenopathy. No hilar adenopathy. Soft tissue extending from the RIGHT hilum peripherally compatible with post treatment changes, similar to previous imaging Lungs/Pleura: Volume loss, bronchiectasis and cystic changes in the RIGHT upper lobe similar to previous imaging. Interval development of nodular area in the LEFT upper lobe (image 95/6) this measures 2.4 x 1.1 cm. Bandlike changes associated with nodularity elsewhere within this area. Nodular area in the LEFT upper lobe anteriorly and medially (image 68/6) site of previous ground-glass on the prior study now measuring 11 x 9 mm. Stable LEFT lower lobe pulmonary nodule (image 107/6) 7 mm. Nodularity extending into the extrapleural fat of the RIGHT upper chest is similar to previous imaging. Musculoskeletal: See below for full musculoskeletal details. CT ABDOMEN PELVIS FINDINGS Hepatobiliary: No focal, suspicious hepatic lesion. No  pericholecystic stranding. No biliary duct dilation. Portal vein is patent. Pancreas: Normal, without mass, inflammation or ductal dilatation. Spleen: Spleen normal size and contour. Adrenals/Urinary Tract: Adrenal glands are normal. Ill-defined area in the RIGHT kidney measuring 2.0 x 1.4 cm previously approximately 2.4 x 1.9 cm. Urinary bladder under distended but without gross abnormality. Stomach/Bowel: No acute gastrointestinal findings. Vascular/Lymphatic: Aortic atherosclerosis. No sign of aneurysm. Smooth contour of the IVC. There is no gastrohepatic or hepatoduodenal ligament lymphadenopathy. No retroperitoneal or mesenteric lymphadenopathy. No pelvic sidewall lymphadenopathy. Reproductive: Unremarkable by CT. Other: No ascites. Musculoskeletal: Subtle sclerosis in LEFT posterior iliac is unchanged. No new suspicious bone lesions though again bony metastases were largely occult on previous imaging. Unchanged appearance of T12 compression fracture IMPRESSION: Interval development of nodular area in the LEFT upper lobe this measures 2.4 x 1.1 cm. This finding is concerning for metastatic disease. Adjacent pleural thickening in this area in the setting of prior radiotherapy makes this area indeterminate with rapid development. Linear border of this on axial images but without bandlike appearance on coronal and sagittal images. Perhaps related to post radiation changes and associated with adjacent nodularity and bandlike changes. Short interval follow-up may be helpful to distinguish from metastatic process. PET scan could also be considered. Nodular area in the LEFT upper lobe anteriorly and medially site of previous ground-glass on the prior study now measuring 11 x 9 mm. This too may relate to post treatment changes but is suspicious given patient history and appearance. Lesion in the RIGHT kidney with similar appearance to recent imaging and with diminished size compared to prior studies from May of 2022.  No evidence of worsening bony metastatic disease with subtle sclerotic lesions and unchanged appearance of T12 compression fracture. Aortic atherosclerosis. Electronically Signed   By: Zetta Bills M.D.   On: 02/16/2021 16:27     ASSESSMENT AND PLAN: This is a very pleasant 60 years old white male with metastatic non-small cell lung cancer that was initially diagnosed as stage IIb (T3, N0, M0) non-small cell lung cancer, adenocarcinoma presented with perihilar right upper lobe lung mass with suspicious groundglass opacity in the right middle lobe.  He had evidence for disease metastasis in March 2022 to the right kidney that was biopsy-proven in April 2022. The patient is not a good surgical candidate for resection according to Dr. Roxan Hockey. The patient underwent a course of concurrent chemoradiation with weekly carboplatin for AUC of 2 and paclitaxel 45 mg/M2 status post 5 cycles.  He has been tolerating this treatment well except for fatigue, mild odynophagia as well as recurrent pneumonia and septicemia.  The patient also has radiation-induced pneumonitis. He was admitted to the hospital several times with recurrent  pneumonia.   The patient had evidence for metastatic disease in April 2022 and he is currently undergoing systemic chemotherapy with carboplatin for AUC of 5, Alimta 500 Mg/M2 and Keytruda 200 Mg IV every 3 weeks status post 7 cycles.  Starting from cycle #5 the patient is on maintenance treatment with Alimta and Keytruda every 3 weeks. The patient has been tolerating this treatment well with no concerning adverse effect except for mild fatigue. He had repeat CT scan of the chest, abdomen pelvis performed recently.  I personally and independently reviewed the scan images and discussed the result and showed the images to the patient today. His scan showed no concerning findings for disease progression except for interval development of left upper lobe lung opacity that could be related  to the previous radiotherapy to the left rib under the care of Dr. Sondra Come but metastatic disease could not be excluded. I recommended for the patient to continue his current treatment with maintenance Alimta and Keytruda and I will continue to monitor the new left upper lobe opacity closely on the upcoming imaging studies. For the pain management, he will continue on oxycodone as needed basis. For the constipation I will give him refill of lactulose. The patient will come back for follow-up visit in 3 weeks for evaluation before the next cycle of his treatment. He was advised to call immediately if he has any other concerning symptoms in the interval.  The patient voices understanding of current disease status and treatment options and is in agreement with the current care plan.  All questions were answered. The patient knows to call the clinic with any problems, questions or concerns. We can certainly see the patient much sooner if necessary.  Disclaimer: This note was dictated with voice recognition software. Similar sounding words can inadvertently be transcribed and may not be corrected upon review.

## 2021-02-18 NOTE — Patient Instructions (Signed)
[facility]   Discharge Instructions: Thank you for choosing [facility] to provide your oncology and hematology care.   If you have a lab appointment with the [facility], please go directly to the [facility] and check in at the registration area.   Wear comfortable clothing and clothing appropriate for easy access to any Portacath or PICC line.   We strive to give you quality time with your provider. You may need to reschedule your appointment if you arrive late (15 or more minutes).  Arriving late affects you and other patients whose appointments are after yours.  Also, if you miss three or more appointments without notifying the office, you may be dismissed from the clinic at the provider's discretion.      For prescription refill requests, have your pharmacy contact our office and allow 72 hours for refills to be completed.    Today you received the following chemotherapy and/or immunotherapy agents: pembrolizumab and pemetrexed.      To help prevent nausea and vomiting after your treatment, we encourage you to take your nausea medication as directed.  BELOW ARE SYMPTOMS THAT SHOULD BE REPORTED IMMEDIATELY: *FEVER GREATER THAN 100.4 F (38 C) OR HIGHER *CHILLS OR SWEATING *NAUSEA AND VOMITING THAT IS NOT CONTROLLED WITH YOUR NAUSEA MEDICATION *UNUSUAL SHORTNESS OF BREATH *UNUSUAL BRUISING OR BLEEDING *URINARY PROBLEMS (pain or burning when urinating, or frequent urination) *BOWEL PROBLEMS (unusual diarrhea, constipation, pain near the anus) TENDERNESS IN MOUTH AND THROAT WITH OR WITHOUT PRESENCE OF ULCERS (sore throat, sores in mouth, or a toothache) UNUSUAL RASH, SWELLING OR PAIN  UNUSUAL VAGINAL DISCHARGE OR ITCHING   Items with * indicate a potential emergency and should be followed up as soon as possible or go to the Emergency Department if any problems should occur.  Please show the CHEMOTHERAPY ALERT CARD or IMMUNOTHERAPY  ALERT CARD at check-in to the Emergency Department and triage nurse.  Should you have questions after your visit or need to cancel or reschedule your appointment, please contact [facility]  Dept: [phone]  and follow the prompts.  Office hours are 8:00 a.m. to 4:30 p.m. Monday - Friday. Please note that voicemails left after 4:00 p.m. may not be returned until the following business day.  We are closed weekends and major holidays. You have access to a nurse at all times for urgent questions. Please call the main number to the clinic Dept: [phone] and follow the prompts.   For any non-urgent questions, you may also contact your provider using MyChart. We now offer e-Visits for anyone 66 and older to request care online for non-urgent symptoms. For details visit [email or web address].   Also download the MyChart app! Go to the app store, search "MyChart", open the app, select [facility], and log in with your MyChart username and password.  Due to Covid, a mask is required upon entering the hospital/clinic. If you do not have a mask, one will be given to you upon arrival. For doctor visits, patients may have 1 support person aged 66 or older with them. For treatment visits, patients cannot have anyone with them due to current Covid guidelines and our immunocompromised population.

## 2021-02-18 NOTE — Progress Notes (Signed)
[facility] [phone]   [phone]  OFFICE PROGRESS NOTE  [name], DO [place]  [address] [place] [address]  DIAGNOSIS: Metastatic non-small cell lung cancer initially diagnosed as stage IIB (T3, N0, M0) non-small cell lung cancer, adenocarcinoma presented with large central perihilar mass with suspicious groundglass opacity in the right middle lobe and left upper lobe diagnosed in September 2021.  The patient had evidence of metastatic disease to the right kidney in March 2022.  Molecular studies by guardant 360: No actionable mutations.   PDl1: 80%   PRIOR THERAPY: Concurrent chemoradiation with weekly carboplatin for AUC of 2 and paclitaxel 45 mg/M2.  First dose February 10, 2020.  Status post 5 cycles.   CURRENT THERAPY:  1) Palliative systemic chemotherapy with carboplatin AUC of 5, Alimta 500 mg/M2, Keytruda 200 mg IV every 3 weeks.  First dose expected on 09/24/2020. Status post 7 cycles.  Starting from cycle #5 the patient is on maintenance treatment with Alimta and Keytruda every 3 weeks. 2) Palliative radiation to the left anterior rib cage area as well as the mid back area under the care of Dr. Sondra Come. Last treatment on 10/05/20.  INTERVAL HISTORY: UPTON RUSSEY 60 y.o. male returns to the clinic today for follow-up visit.  The patient is feeling fine today with no concerning complaints except for constipation.  His last bowel movement was 3 days ago.  He was treated with lactulose and he has significant improvement in his constipation but he is running out of his medication.  He also has occasional pain in his left iliac bone area and also under the left breast.  He takes Tylenol and it does help his pain.  He denied having any shortness of breath but continues to have cough productive of whitish sputum with no hemoptysis.  He has no nausea, vomiting, abdominal pain or diarrhea.  He denied having any fever or  chills.  He has no headache or visual changes.  He has been tolerating his treatment with maintenance Alimta and Keytruda fairly well.  The patient is here today for evaluation with repeat CT scan of the chest, abdomen and pelvis for restaging of his disease.   MEDICAL HISTORY: Past Medical History:  Diagnosis Date   Allergic rhinitis, cause unspecified    Anxiety state, unspecified    Asthma    as a child   Chronic airway obstruction, not elsewhere classified    Esophageal reflux    History of kidney stones    History of radiation therapy 02/13/2020-03/24/2020   right lung        Dr Gery Pray   Hypertension    Lumbago    lung ca dx'd 12/2019   Other and unspecified hyperlipidemia    Other chest pain     ALLERGIES:  is allergic to penicillins and amoxicillin.  MEDICATIONS:  Current Outpatient Medications  Medication Sig Dispense Refill   acetaminophen (TYLENOL) 500 MG tablet Take 1,000 mg by mouth 2 (two) times daily.     Brimonidine Tartrate (LUMIFY) 0.025 % SOLN Place 1 drop into both eyes 2 (two) times a week. As needed (Patient not taking: Reported on 02/16/2021)     COMBIVENT RESPIMAT 20-100 MCG/ACT AERS respimat INHALE 1 TO 2 PUFFS BY MOUTH EVERY 6 HOURS AS NEEDED FOR WHEEZING 4 g 5   dextromethorphan-guaiFENesin (MUCINEX DM) 30-600 MG 12hr tablet Take 1 tablet by mouth 2 (two) times daily.  feeding supplement (ENSURE ENLIVE / ENSURE PLUS) LIQD Take 237 mLs by mouth 3 (three) times daily between meals. (Patient taking differently: Take 237 mLs by mouth 2 (two) times daily between meals.) 237 mL 12   fenofibrate 160 MG tablet Take 1 tablet by mouth once daily (Patient taking differently: Take 160 mg by mouth daily.) 90 tablet 3   ferrous sulfate 325 (65 FE) MG tablet Take 1 tablet (325 mg total) by mouth daily with breakfast. 30 tablet 0   folic acid (FOLVITE) 1 MG tablet Take 1 tablet by mouth once daily 30 tablet 0   lactulose (CHRONULAC) 10 GM/15ML solution Take 15 mLs  (10 g total) by mouth 3 (three) times daily. 236 mL 0   lidocaine-prilocaine (EMLA) cream Apply 1 application topically as needed. 30 g 2   LORazepam (ATIVAN) 1 MG tablet Take 1 tablet (1 mg total) by mouth every 8 (eight) hours as needed for anxiety. 90 tablet 5   meclizine (ANTIVERT) 25 MG tablet Take 1 tablet (25 mg total) by mouth every 6 (six) hours as needed for dizziness. 60 tablet 0   Multiple Vitamin (MULTIVITAMIN WITH MINERALS) TABS tablet Take 1 tablet by mouth daily.     ondansetron (ZOFRAN) 8 MG tablet Take 1 tablet (8 mg total) by mouth every 8 (eight) hours as needed for nausea or vomiting. Starting day 3 after chemotherapy 30 tablet 2   oxyCODONE-acetaminophen (PERCOCET/ROXICET) 5-325 MG tablet Take 1 tablet by mouth every 6 (six) hours as needed for severe pain. 30 tablet 0   potassium chloride SA (KLOR-CON) 20 MEQ tablet Take 1 tablet (20 mEq total) by mouth daily. (Patient not taking: Reported on 02/16/2021) 5 tablet 0   predniSONE (DELTASONE) 10 MG tablet Take 1 tablet (10 mg total) by mouth daily with breakfast. 90 tablet 0   prochlorperazine (COMPAZINE) 10 MG tablet Take 1 tablet (10 mg total) by mouth every 6 (six) hours as needed. 30 tablet 2   No current facility-administered medications for this visit.   Facility-Administered Medications Ordered in Other Visits  Medication Dose Route Frequency Provider Last Rate Last Admin   0.9 %  sodium chloride infusion   Intravenous Continuous Curt Bears, MD   Stopped at 03/05/20 1410   cyanocobalamin ((VITAMIN B-12)) injection 1,000 mcg  1,000 mcg Intramuscular Once Curt Bears, MD       heparin lock flush 100 unit/mL  500 Units Intracatheter Once PRN Curt Bears, MD       pembrolizumab [facility]) 200 mg in sodium chloride 0.9 % 50 mL chemo infusion  200 mg Intravenous Once Curt Bears, MD       PEMEtrexed (ALIMTA) 1,000 mg in sodium chloride 0.9 % 100 mL chemo infusion  500 mg/m2 (Treatment Plan Recorded)  Intravenous Once Curt Bears, MD       prochlorperazine (COMPAZINE) tablet 10 mg  10 mg Oral Once Curt Bears, MD       sodium chloride flush (NS) 0.9 % injection 10 mL  10 mL Intracatheter PRN Curt Bears, MD        SURGICAL HISTORY:  Past Surgical History:  Procedure Laterality Date   APPENDECTOMY     BRONCHIAL BIOPSY  01/07/2020   Procedure: BRONCHIAL BIOPSIES;  Surgeon: Collene Gobble, MD;  Location: [facility] ENDOSCOPY;  Service: Pulmonary;;   BRONCHIAL BIOPSY  04/05/2020   Procedure: BRONCHIAL BIOPSIES;  Surgeon: Rigoberto Noel, MD;  Location: [place];  Service: Cardiopulmonary;;   BRONCHIAL BRUSHINGS  01/07/2020   Procedure:  BRONCHIAL BRUSHINGS;  Surgeon: Collene Gobble, MD;  Location: [facility] ENDOSCOPY;  Service: Pulmonary;;   BRONCHIAL NEEDLE ASPIRATION BIOPSY  01/07/2020   Procedure: BRONCHIAL NEEDLE ASPIRATION BIOPSIES;  Surgeon: Collene Gobble, MD;  Location: [facility] ENDOSCOPY;  Service: Pulmonary;;   BRONCHIAL WASHINGS  01/07/2020   Procedure: BRONCHIAL WASHINGS;  Surgeon: Collene Gobble, MD;  Location: [facility] ENDOSCOPY;  Service: Pulmonary;;   BRONCHIAL WASHINGS  04/05/2020   Procedure: BRONCHIAL WASHINGS;  Surgeon: Rigoberto Noel, MD;  Location: [place];  Service: Cardiopulmonary;;   COLONOSCOPY  15 years ago   HEMOSTASIS CONTROL  01/07/2020   Procedure: HEMOSTASIS CONTROL;  Surgeon: Collene Gobble, MD;  Location: [facility] ENDOSCOPY;  Service: Pulmonary;;  cold saline   IR IMAGING GUIDED PORT INSERTION  09/18/2020   NASAL TURBINATE REDUCTION  2002   Dr.Crossley   VASECTOMY     VIDEO BRONCHOSCOPY Right 04/05/2020   Procedure: VIDEO BRONCHOSCOPY WITH FLUORO;  Surgeon: Rigoberto Noel, MD;  Location: [place];  Service: Cardiopulmonary;  Laterality: Right;   VIDEO BRONCHOSCOPY WITH ENDOBRONCHIAL NAVIGATION N/A 01/07/2020   Procedure: VIDEO BRONCHOSCOPY WITH ENDOBRONCHIAL NAVIGATION;  Surgeon: Collene Gobble, MD;  Location: [place] ENDOSCOPY;  Service: Pulmonary;  Laterality:  N/A;   VIDEO BRONCHOSCOPY WITH ENDOBRONCHIAL ULTRASOUND N/A 01/07/2020   Procedure: VIDEO BRONCHOSCOPY WITH ENDOBRONCHIAL ULTRASOUND;  Surgeon: Collene Gobble, MD;  Location: [place] ENDOSCOPY;  Service: Pulmonary;  Laterality: N/A;    REVIEW OF SYSTEMS:  Constitutional: positive for fatigue Eyes: negative Ears, nose, mouth, throat, and face: negative Respiratory: positive for cough, dyspnea on exertion, and pleurisy/chest pain Cardiovascular: negative Gastrointestinal: positive for constipation Genitourinary:negative Integument/breast: negative Hematologic/lymphatic: negative Musculoskeletal:negative Neurological: negative Behavioral/Psych: negative Endocrine: negative Allergic/Immunologic: negative   PHYSICAL EXAMINATION: General appearance: alert, cooperative, fatigued, and no distress Head: Normocephalic, without obvious abnormality, atraumatic Neck: no adenopathy, no JVD, supple, symmetrical, trachea midline, and thyroid not enlarged, symmetric, no tenderness/mass/nodules Lymph nodes: Cervical, supraclavicular, and axillary nodes normal. Resp: clear to auscultation bilaterally Back: symmetric, no curvature. ROM normal. No CVA tenderness. Cardio: regular rate and rhythm, S1, S2 normal, no murmur, click, rub or gallop GI: soft, non-tender; bowel sounds normal; no masses,  no organomegaly Extremities: extremities normal, atraumatic, no cyanosis or edema Neurologic: Alert and oriented X 3, normal strength and tone. Normal symmetric reflexes. Normal coordination and gait  ECOG PERFORMANCE STATUS: 1 - Symptomatic but completely ambulatory  Blood pressure 122/75, pulse 98, temperature 98.1 F (36.7 C), temperature source Tympanic, resp. rate 20, height _0  (1.778 m), weight 197 lb 3.2 oz (89.4 kg), SpO2 100 %.  LABORATORY DATA: Lab Results  Component Value Date   WBC 7.5 02/18/2021   HGB 10.3 (L) 02/18/2021   HCT 29.9 (L) 02/18/2021   MCV 97.1 02/18/2021   PLT 220 02/18/2021       Chemistry      Component Value Date/Time   NA 139 02/18/2021 0840   K 3.5 02/18/2021 0840   CL 106 02/18/2021 0840   CO2 23 02/18/2021 0840   BUN 11 02/18/2021 0840   CREATININE 1.00 02/18/2021 0840   CREATININE 0.67 (L) 06/24/2020 1517      Component Value Date/Time   CALCIUM 9.3 02/18/2021 0840   ALKPHOS 80 02/18/2021 0840   AST 22 02/18/2021 0840   ALT 11 02/18/2021 0840   BILITOT 0.5 02/18/2021 0840       RADIOGRAPHIC STUDIES: CT Chest W Contrast  Result Date: 02/16/2021 CLINICAL DATA:  A 60 year old male presents with history  of non-small cell lung cancer diagnosed in 2021. Post chemo and radiotherapy, currently on systemic therapy. EXAM: CT CHEST, ABDOMEN, AND PELVIS WITH CONTRAST TECHNIQUE: Multidetector CT imaging of the chest, abdomen and pelvis was performed following the standard protocol during bolus administration of intravenous contrast. CONTRAST:  33m OMNIPAQUE IOHEXOL 350 MG/ML SOLN COMPARISON:  Comparison made with imaging from August and June of 2022 (CT evaluations), also with prior PET scan from May of 2022. FINDINGS: CT CHEST FINDINGS Cardiovascular: RIGHT-sided Port-A-Cath terminates at the caval to atrial junction as before. Heart size is stable without signs of pericardial effusion of substantial volume, small amount of pericardial fluid is similar to the prior exam. Central pulmonary vasculature is unremarkable. Mediastinum/Nodes: No adenopathy at the thoracic inlet or in the bilateral axillary regions. No mediastinal adenopathy. No hilar adenopathy. Soft tissue extending from the RIGHT hilum peripherally compatible with post treatment changes, similar to previous imaging Lungs/Pleura: Volume loss, bronchiectasis and cystic changes in the RIGHT upper lobe similar to previous imaging. Interval development of nodular area in the LEFT upper lobe (image 95/6) this measures 2.4 x 1.1 cm. Bandlike changes associated with nodularity elsewhere within this area.  Nodular area in the LEFT upper lobe anteriorly and medially (image 68/6) site of previous ground-glass on the prior study now measuring 11 x 9 mm. Stable LEFT lower lobe pulmonary nodule (image 107/6) 7 mm. Nodularity extending into the extrapleural fat of the RIGHT upper chest is similar to previous imaging. Musculoskeletal: See below for full musculoskeletal details. CT ABDOMEN PELVIS FINDINGS Hepatobiliary: No focal, suspicious hepatic lesion. No pericholecystic stranding. No biliary duct dilation. Portal vein is patent. Pancreas: Normal, without mass, inflammation or ductal dilatation. Spleen: Spleen normal size and contour. Adrenals/Urinary Tract: Adrenal glands are normal. Ill-defined area in the RIGHT kidney measuring 2.0 x 1.4 cm previously approximately 2.4 x 1.9 cm. Urinary bladder under distended but without gross abnormality. Stomach/Bowel: No acute gastrointestinal findings. Vascular/Lymphatic: Aortic atherosclerosis. No sign of aneurysm. Smooth contour of the IVC. There is no gastrohepatic or hepatoduodenal ligament lymphadenopathy. No retroperitoneal or mesenteric lymphadenopathy. No pelvic sidewall lymphadenopathy. Reproductive: Unremarkable by CT. Other: No ascites. Musculoskeletal: Subtle sclerosis in LEFT posterior iliac is unchanged. No new suspicious bone lesions though again bony metastases were largely occult on previous imaging. Unchanged appearance of T12 compression fracture IMPRESSION: Interval development of nodular area in the LEFT upper lobe this measures 2.4 x 1.1 cm. This finding is concerning for metastatic disease. Adjacent pleural thickening in this area in the setting of prior radiotherapy makes this area indeterminate with rapid development. Linear border of this on axial images but without bandlike appearance on coronal and sagittal images. Perhaps related to post radiation changes and associated with adjacent nodularity and bandlike changes. Short interval follow-up may be  helpful to distinguish from metastatic process. PET scan could also be considered. Nodular area in the LEFT upper lobe anteriorly and medially site of previous ground-glass on the prior study now measuring 11 x 9 mm. This too may relate to post treatment changes but is suspicious given patient history and appearance. Lesion in the RIGHT kidney with similar appearance to recent imaging and with diminished size compared to prior studies from May of 2022. No evidence of worsening bony metastatic disease with subtle sclerotic lesions and unchanged appearance of T12 compression fracture. Aortic atherosclerosis. Electronically Signed   By: GZetta BillsM.D.   On: 02/16/2021 16:27   CT Abdomen Pelvis W Contrast  Result Date: 02/16/2021 CLINICAL DATA:  A  60 year old male presents with history of non-small cell lung cancer diagnosed in 2021. Post chemo and radiotherapy, currently on systemic therapy. EXAM: CT CHEST, ABDOMEN, AND PELVIS WITH CONTRAST TECHNIQUE: Multidetector CT imaging of the chest, abdomen and pelvis was performed following the standard protocol during bolus administration of intravenous contrast. CONTRAST:  33m OMNIPAQUE IOHEXOL 350 MG/ML SOLN COMPARISON:  Comparison made with imaging from August and June of 2022 (CT evaluations), also with prior PET scan from May of 2022. FINDINGS: CT CHEST FINDINGS Cardiovascular: RIGHT-sided Port-A-Cath terminates at the caval to atrial junction as before. Heart size is stable without signs of pericardial effusion of substantial volume, small amount of pericardial fluid is similar to the prior exam. Central pulmonary vasculature is unremarkable. Mediastinum/Nodes: No adenopathy at the thoracic inlet or in the bilateral axillary regions. No mediastinal adenopathy. No hilar adenopathy. Soft tissue extending from the RIGHT hilum peripherally compatible with post treatment changes, similar to previous imaging Lungs/Pleura: Volume loss, bronchiectasis and cystic  changes in the RIGHT upper lobe similar to previous imaging. Interval development of nodular area in the LEFT upper lobe (image 95/6) this measures 2.4 x 1.1 cm. Bandlike changes associated with nodularity elsewhere within this area. Nodular area in the LEFT upper lobe anteriorly and medially (image 68/6) site of previous ground-glass on the prior study now measuring 11 x 9 mm. Stable LEFT lower lobe pulmonary nodule (image 107/6) 7 mm. Nodularity extending into the extrapleural fat of the RIGHT upper chest is similar to previous imaging. Musculoskeletal: See below for full musculoskeletal details. CT ABDOMEN PELVIS FINDINGS Hepatobiliary: No focal, suspicious hepatic lesion. No pericholecystic stranding. No biliary duct dilation. Portal vein is patent. Pancreas: Normal, without mass, inflammation or ductal dilatation. Spleen: Spleen normal size and contour. Adrenals/Urinary Tract: Adrenal glands are normal. Ill-defined area in the RIGHT kidney measuring 2.0 x 1.4 cm previously approximately 2.4 x 1.9 cm. Urinary bladder under distended but without gross abnormality. Stomach/Bowel: No acute gastrointestinal findings. Vascular/Lymphatic: Aortic atherosclerosis. No sign of aneurysm. Smooth contour of the IVC. There is no gastrohepatic or hepatoduodenal ligament lymphadenopathy. No retroperitoneal or mesenteric lymphadenopathy. No pelvic sidewall lymphadenopathy. Reproductive: Unremarkable by CT. Other: No ascites. Musculoskeletal: Subtle sclerosis in LEFT posterior iliac is unchanged. No new suspicious bone lesions though again bony metastases were largely occult on previous imaging. Unchanged appearance of T12 compression fracture IMPRESSION: Interval development of nodular area in the LEFT upper lobe this measures 2.4 x 1.1 cm. This finding is concerning for metastatic disease. Adjacent pleural thickening in this area in the setting of prior radiotherapy makes this area indeterminate with rapid development. Linear  border of this on axial images but without bandlike appearance on coronal and sagittal images. Perhaps related to post radiation changes and associated with adjacent nodularity and bandlike changes. Short interval follow-up may be helpful to distinguish from metastatic process. PET scan could also be considered. Nodular area in the LEFT upper lobe anteriorly and medially site of previous ground-glass on the prior study now measuring 11 x 9 mm. This too may relate to post treatment changes but is suspicious given patient history and appearance. Lesion in the RIGHT kidney with similar appearance to recent imaging and with diminished size compared to prior studies from May of 2022. No evidence of worsening bony metastatic disease with subtle sclerotic lesions and unchanged appearance of T12 compression fracture. Aortic atherosclerosis. Electronically Signed   By: GZetta BillsM.D.   On: 02/16/2021 16:27     ASSESSMENT AND PLAN: This is a  very pleasant 60 years old white male with metastatic non-small cell lung cancer that was initially diagnosed as stage IIb (T3, N0, M0) non-small cell lung cancer, adenocarcinoma presented with perihilar right upper lobe lung mass with suspicious groundglass opacity in the right middle lobe.  He had evidence for disease metastasis in March 2022 to the right kidney that was biopsy-proven in April 2022. The patient is not a good surgical candidate for resection according to Dr. Roxan Hockey. The patient underwent a course of concurrent chemoradiation with weekly carboplatin for AUC of 2 and paclitaxel 45 mg/M2 status post 5 cycles.  He has been tolerating this treatment well except for fatigue, mild odynophagia as well as recurrent pneumonia and septicemia.  The patient also has radiation-induced pneumonitis. He was admitted to the hospital several times with recurrent pneumonia.   The patient had evidence for metastatic disease in April 2022 and he is currently undergoing  systemic chemotherapy with carboplatin for AUC of 5, Alimta 500 Mg/M2 and Keytruda 200 Mg IV every 3 weeks status post 7 cycles.  Starting from cycle #5 the patient is on maintenance treatment with Alimta and Keytruda every 3 weeks. The patient has been tolerating this treatment well with no concerning adverse effect except for mild fatigue. He had repeat CT scan of the chest, abdomen pelvis performed recently.  I personally and independently reviewed the scan images and discussed the result and showed the images to the patient today. His scan showed no concerning findings for disease progression except for interval development of left upper lobe lung opacity that could be related to the previous radiotherapy to the left rib under the care of Dr. Sondra Come but metastatic disease could not be excluded. I recommended for the patient to continue his current treatment with maintenance Alimta and Keytruda and I will continue to monitor the new left upper lobe opacity closely on the upcoming imaging studies. For the pain management, he will continue on oxycodone as needed basis. For the constipation I will give him refill of lactulose. The patient will come back for follow-up visit in 3 weeks for evaluation before the next cycle of his treatment. He was advised to call immediately if he has any other concerning symptoms in the interval.  The patient voices understanding of current disease status and treatment options and is in agreement with the current care plan.  All questions were answered. The patient knows to call the clinic with any problems, questions or concerns. We can certainly see the patient much sooner if necessary.  Disclaimer: This note was dictated with voice recognition software. Similar sounding words can inadvertently be transcribed and may not be corrected upon review.

## 2021-02-22 ENCOUNTER — Telehealth: Payer: Self-pay | Admitting: Internal Medicine

## 2021-02-22 NOTE — Telephone Encounter (Signed)
Scheduled follow-up appointments per 10/27 los. Patient is aware.

## 2021-02-23 NOTE — Progress Notes (Signed)
@Patient  ID: Richard Li, male    DOB: [birth year], 60 y.o.   MRN: [id number]  Chief Complaint  Patient presents with   Follow-up    Sob: during exertion and rest, productive cough with clear/yellow mucus    Referring provider: [name], DO  HPI:   60 year old man with diagnosed right-sided lung cancer 12/2019 via bronchoscopy/EBUS who spent essentially all of November in the hospital for recurrent right-sided pneumonia whom are seeing in follow-up of the same.  Patient return to clinic today.  Cough and phlegm production stable.  Continues on doxycycline as well as prednisone.  No real issues or concerns.  Discussed trial of reducing prednisone or doxycycline.  We will start with doxycycline.   HPI at initial visit: Started with productive cough it seems early November.  This is prompted 4 separate hospitalizations and multiple antibiotic courses.  He had recently completed first cycle of carboplatin/paclitaxel.  Was in the midst of radiation therapy to the right upper lobe mass which he completed around Thanksgiving 2021.  He has had cough productive of green sputum.  It is foul tasting.  Smells bad.  Despite multiple rounds of antibiotic is not improved.  His shortness of breath is worsening.  The most recent hospitalization 03/2020 a bronchoscopy was pursued. Mucopurulent secretions noted emanating from RUL. Secretions likely aspirated seen in BI, RLL. Culture with OP flora. No cell counts sent.  Continually coughing, producing sputum as above. Worried because not getting chemotherapy for cancer. Recently completed coures 14 day prednisone for possible radiation pneumonitis. This did not help symptoms. Finishing course of augmentin. Not seen improvement.  Serial CXRs reviewed which show persistent and worsening R mid and upper lung field opacities. CT 02/2020 reviewed with necrotic appearing mass near RUL takeoff centrally, dense consolidations posterior RUL and throughout R with  scattered GGOs in left lung.   Questionaires / Pulmonary Flowsheets:   ACT:  No flowsheet data found.  MMRC: mMRC Dyspnea Scale mMRC Score  06/05/2020 2    Epworth:  No flowsheet data found.  Tests:   FENO:  No results found for: NITRICOXIDE  PFT: No flowsheet data found.  WALK:  No flowsheet data found.  Imaging: Personally reviewed and as per EMR and discussion this note  CT Chest W Contrast  Result Date: 02/16/2021 CLINICAL DATA:  A 60 year old male presents with history of non-small cell lung cancer diagnosed in 2021. Post chemo and radiotherapy, currently on systemic therapy. EXAM: CT CHEST, ABDOMEN, AND PELVIS WITH CONTRAST TECHNIQUE: Multidetector CT imaging of the chest, abdomen and pelvis was performed following the standard protocol during bolus administration of intravenous contrast. CONTRAST:  57mL OMNIPAQUE IOHEXOL 350 MG/ML SOLN COMPARISON:  Comparison made with imaging from August and June of 2022 (CT evaluations), also with prior PET scan from May of 2022. FINDINGS: CT CHEST FINDINGS Cardiovascular: RIGHT-sided Port-A-Cath terminates at the caval to atrial junction as before. Heart size is stable without signs of pericardial effusion of substantial volume, small amount of pericardial fluid is similar to the prior exam. Central pulmonary vasculature is unremarkable. Mediastinum/Nodes: No adenopathy at the thoracic inlet or in the bilateral axillary regions. No mediastinal adenopathy. No hilar adenopathy. Soft tissue extending from the RIGHT hilum peripherally compatible with post treatment changes, similar to previous imaging Lungs/Pleura: Volume loss, bronchiectasis and cystic changes in the RIGHT upper lobe similar to previous imaging. Interval development of nodular area in the LEFT upper lobe (image 95/6) this measures 2.4 x 1.1 cm. Bandlike changes associated  with nodularity elsewhere within this area. Nodular area in the LEFT upper lobe anteriorly and medially  (image 68/6) site of previous ground-glass on the prior study now measuring 11 x 9 mm. Stable LEFT lower lobe pulmonary nodule (image 107/6) 7 mm. Nodularity extending into the extrapleural fat of the RIGHT upper chest is similar to previous imaging. Musculoskeletal: See below for full musculoskeletal details. CT ABDOMEN PELVIS FINDINGS Hepatobiliary: No focal, suspicious hepatic lesion. No pericholecystic stranding. No biliary duct dilation. Portal vein is patent. Pancreas: Normal, without mass, inflammation or ductal dilatation. Spleen: Spleen normal size and contour. Adrenals/Urinary Tract: Adrenal glands are normal. Ill-defined area in the RIGHT kidney measuring 2.0 x 1.4 cm previously approximately 2.4 x 1.9 cm. Urinary bladder under distended but without gross abnormality. Stomach/Bowel: No acute gastrointestinal findings. Vascular/Lymphatic: Aortic atherosclerosis. No sign of aneurysm. Smooth contour of the IVC. There is no gastrohepatic or hepatoduodenal ligament lymphadenopathy. No retroperitoneal or mesenteric lymphadenopathy. No pelvic sidewall lymphadenopathy. Reproductive: Unremarkable by CT. Other: No ascites. Musculoskeletal: Subtle sclerosis in LEFT posterior iliac is unchanged. No new suspicious bone lesions though again bony metastases were largely occult on previous imaging. Unchanged appearance of T12 compression fracture IMPRESSION: Interval development of nodular area in the LEFT upper lobe this measures 2.4 x 1.1 cm. This finding is concerning for metastatic disease. Adjacent pleural thickening in this area in the setting of prior radiotherapy makes this area indeterminate with rapid development. Linear border of this on axial images but without bandlike appearance on coronal and sagittal images. Perhaps related to post radiation changes and associated with adjacent nodularity and bandlike changes. Short interval follow-up may be helpful to distinguish from metastatic process. PET scan could  also be considered. Nodular area in the LEFT upper lobe anteriorly and medially site of previous ground-glass on the prior study now measuring 11 x 9 mm. This too may relate to post treatment changes but is suspicious given patient history and appearance. Lesion in the RIGHT kidney with similar appearance to recent imaging and with diminished size compared to prior studies from May of 2022. No evidence of worsening bony metastatic disease with subtle sclerotic lesions and unchanged appearance of T12 compression fracture. Aortic atherosclerosis. Electronically Signed   By: Zetta Bills M.D.   On: 02/16/2021 16:27   CT Abdomen Pelvis W Contrast  Result Date: 02/16/2021 CLINICAL DATA:  A 60 year old male presents with history of non-small cell lung cancer diagnosed in 2021. Post chemo and radiotherapy, currently on systemic therapy. EXAM: CT CHEST, ABDOMEN, AND PELVIS WITH CONTRAST TECHNIQUE: Multidetector CT imaging of the chest, abdomen and pelvis was performed following the standard protocol during bolus administration of intravenous contrast. CONTRAST:  6mL OMNIPAQUE IOHEXOL 350 MG/ML SOLN COMPARISON:  Comparison made with imaging from August and June of 2022 (CT evaluations), also with prior PET scan from May of 2022. FINDINGS: CT CHEST FINDINGS Cardiovascular: RIGHT-sided Port-A-Cath terminates at the caval to atrial junction as before. Heart size is stable without signs of pericardial effusion of substantial volume, small amount of pericardial fluid is similar to the prior exam. Central pulmonary vasculature is unremarkable. Mediastinum/Nodes: No adenopathy at the thoracic inlet or in the bilateral axillary regions. No mediastinal adenopathy. No hilar adenopathy. Soft tissue extending from the RIGHT hilum peripherally compatible with post treatment changes, similar to previous imaging Lungs/Pleura: Volume loss, bronchiectasis and cystic changes in the RIGHT upper lobe similar to previous imaging.  Interval development of nodular area in the LEFT upper lobe (image 95/6) this measures 2.4  x 1.1 cm. Bandlike changes associated with nodularity elsewhere within this area. Nodular area in the LEFT upper lobe anteriorly and medially (image 68/6) site of previous ground-glass on the prior study now measuring 11 x 9 mm. Stable LEFT lower lobe pulmonary nodule (image 107/6) 7 mm. Nodularity extending into the extrapleural fat of the RIGHT upper chest is similar to previous imaging. Musculoskeletal: See below for full musculoskeletal details. CT ABDOMEN PELVIS FINDINGS Hepatobiliary: No focal, suspicious hepatic lesion. No pericholecystic stranding. No biliary duct dilation. Portal vein is patent. Pancreas: Normal, without mass, inflammation or ductal dilatation. Spleen: Spleen normal size and contour. Adrenals/Urinary Tract: Adrenal glands are normal. Ill-defined area in the RIGHT kidney measuring 2.0 x 1.4 cm previously approximately 2.4 x 1.9 cm. Urinary bladder under distended but without gross abnormality. Stomach/Bowel: No acute gastrointestinal findings. Vascular/Lymphatic: Aortic atherosclerosis. No sign of aneurysm. Smooth contour of the IVC. There is no gastrohepatic or hepatoduodenal ligament lymphadenopathy. No retroperitoneal or mesenteric lymphadenopathy. No pelvic sidewall lymphadenopathy. Reproductive: Unremarkable by CT. Other: No ascites. Musculoskeletal: Subtle sclerosis in LEFT posterior iliac is unchanged. No new suspicious bone lesions though again bony metastases were largely occult on previous imaging. Unchanged appearance of T12 compression fracture IMPRESSION: Interval development of nodular area in the LEFT upper lobe this measures 2.4 x 1.1 cm. This finding is concerning for metastatic disease. Adjacent pleural thickening in this area in the setting of prior radiotherapy makes this area indeterminate with rapid development. Linear border of this on axial images but without bandlike appearance  on coronal and sagittal images. Perhaps related to post radiation changes and associated with adjacent nodularity and bandlike changes. Short interval follow-up may be helpful to distinguish from metastatic process. PET scan could also be considered. Nodular area in the LEFT upper lobe anteriorly and medially site of previous ground-glass on the prior study now measuring 11 x 9 mm. This too may relate to post treatment changes but is suspicious given patient history and appearance. Lesion in the RIGHT kidney with similar appearance to recent imaging and with diminished size compared to prior studies from May of 2022. No evidence of worsening bony metastatic disease with subtle sclerotic lesions and unchanged appearance of T12 compression fracture. Aortic atherosclerosis. Electronically Signed   By: Zetta Bills M.D.   On: 02/16/2021 16:27     Lab Results: Personally reviewed CBC    Component Value Date/Time   WBC 7.5 02/18/2021 0840   WBC 7.9 08/20/2020 0941   RBC 3.08 (L) 02/18/2021 0840   HGB 10.3 (L) 02/18/2021 0840   HCT 29.9 (L) 02/18/2021 0840   PLT 220 02/18/2021 0840   MCV 97.1 02/18/2021 0840   MCH 33.4 02/18/2021 0840   MCHC 34.4 02/18/2021 0840   RDW 16.5 (H) 02/18/2021 0840   LYMPHSABS 0.4 (L) 02/18/2021 0840   MONOABS 1.1 (H) 02/18/2021 0840   EOSABS 0.1 02/18/2021 0840   BASOSABS 0.0 02/18/2021 0840    BMET    Component Value Date/Time   NA 139 02/18/2021 0840   K 3.5 02/18/2021 0840   CL 106 02/18/2021 0840   CO2 23 02/18/2021 0840   GLUCOSE 94 02/18/2021 0840   BUN 11 02/18/2021 0840   CREATININE 1.00 02/18/2021 0840   CREATININE 0.67 (L) 06/24/2020 1517   CALCIUM 9.3 02/18/2021 0840   GFRNONAA >60 02/18/2021 0840   GFRNONAA 105 06/24/2020 1517   GFRAA 122 06/24/2020 1517    BNP No results found for: BNP  ProBNP No results found for: PROBNP  Specialty Problems       Pulmonary Problems   Allergic rhinitis    Qualifier: Diagnosis of  By: Julien Girt  CMA, Leigh        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HCC)    Qualifier: Diagnosis of  By: Julien Girt CMA, Leigh        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   Pulmonary nodules/lesions, multiple   Adenocarcinoma of right lung, stage 2 (HCC)   SOB (shortness of breath)   Recurrent pneumonia   Adenocarcinoma of right lung, stage 4 (HCC)    Allergies  Allergen Reactions   Penicillins     Unknown reaction   Amoxicillin Rash    Immunization History  Administered Date(s) Administered   Influenza Split 01/25/2012   Influenza Whole 02/13/2009, 01/25/2010   Influenza,inj,Quad PF,6+ Mos 03/30/2015, 02/16/2016, 02/27/2017, 01/24/2018, 03/01/2019, 02/10/2020, 02/16/2021   Influenza,inj,quad, With Preservative 01/23/2017   Tdap 12/12/2011    Past Medical History:  Diagnosis Date   Allergic rhinitis, cause unspecified    Anxiety state, unspecified    Asthma    as a child   Chronic airway obstruction, not elsewhere classified    Esophageal reflux    History of kidney stones    History of radiation therapy 02/13/2020-03/24/2020   right lung        Dr Gery Pray   Hypertension    Lumbago    lung ca dx'd 12/2019   Other and unspecified hyperlipidemia    Other chest pain     Tobacco History: Social History   Tobacco Use  Smoking Status Former   Packs/day: 2.00   Years: 28.00   Pack years: 56.00   Types: Cigarettes   Quit date: 04/25/2005   Years since quitting: 15.8  Smokeless Tobacco Never   Counseling given: Not Answered   Continue to not smoke  Outpatient Encounter Medications as of 02/16/2021  Medication Sig   acetaminophen (TYLENOL) 500 MG tablet Take 1,000 mg by mouth 2 (two) times daily.   COMBIVENT RESPIMAT 20-100 MCG/ACT AERS respimat INHALE 1 TO 2 PUFFS BY MOUTH EVERY 6 HOURS AS NEEDED FOR WHEEZING   dextromethorphan-guaiFENesin (MUCINEX DM) 30-600 MG 12hr tablet Take 1 tablet by mouth 2 (two) times daily.   feeding  supplement (ENSURE ENLIVE / ENSURE PLUS) LIQD Take 237 mLs by mouth 3 (three) times daily between meals. (Patient taking differently: Take 237 mLs by mouth 2 (two) times daily between meals.)   fenofibrate 160 MG tablet Take 1 tablet by mouth once daily (Patient taking differently: Take 160 mg by mouth daily.)   ferrous sulfate 325 (65 FE) MG tablet Take 1 tablet (325 mg total) by mouth daily with breakfast.   folic acid (FOLVITE) 1 MG tablet Take 1 tablet by mouth once daily   lidocaine-prilocaine (EMLA) cream Apply 1 application topically as needed.   LORazepam (ATIVAN) 1 MG tablet Take 1 tablet (1 mg total) by mouth every 8 (eight) hours as needed for anxiety.   meclizine (ANTIVERT) 25 MG tablet Take 1 tablet (25 mg total) by mouth every 6 (six) hours as needed for dizziness.   Multiple Vitamin (MULTIVITAMIN WITH MINERALS) TABS tablet Take 1 tablet by mouth daily.   ondansetron (ZOFRAN) 8 MG tablet Take 1 tablet (8 mg total) by mouth every 8 (eight) hours as needed for nausea or vomiting. Starting day 3 after chemotherapy   oxyCODONE-acetaminophen (PERCOCET/ROXICET) 5-325 MG tablet Take 1 tablet by mouth every 6 (six) hours as needed  for severe pain.   predniSONE (DELTASONE) 10 MG tablet Take 1 tablet (10 mg total) by mouth daily with breakfast.   prochlorperazine (COMPAZINE) 10 MG tablet Take 1 tablet (10 mg total) by mouth every 6 (six) hours as needed.   [DISCONTINUED] doxycycline (VIBRA-TABS) 100 MG tablet Take 1 tablet by mouth twice daily   [DISCONTINUED] lactulose (CHRONULAC) 10 GM/15ML solution Take 15 mLs (10 g total) by mouth 3 (three) times daily.   Brimonidine Tartrate (LUMIFY) 0.025 % SOLN Place 1 drop into both eyes 2 (two) times a week. As needed (Patient not taking: Reported on 02/16/2021)   potassium chloride SA (KLOR-CON) 20 MEQ tablet Take 1 tablet (20 mEq total) by mouth daily. (Patient not taking: Reported on 02/16/2021)   [DISCONTINUED] sucralfate (CARAFATE) 1 g tablet Take 1  tablet (1 g total) by mouth 4 (four) times daily -  with meals and at bedtime. (Patient not taking: Reported on 03/05/2020)   Facility-Administered Encounter Medications as of 02/16/2021  Medication   0.9 %  sodium chloride infusion     Review of Systems n/a  Physical Exam  BP 108/62 (BP Location: Right Arm, Cuff Size: Normal)   Pulse 87   Temp 99.8 F (37.7 C)   Ht 5\' 10"  (1.778 m)   Wt 194 lb 6.4 oz (88.2 kg)   SpO2 98%   BMI 27.89 kg/m   Wt Readings from Last 5 Encounters:  02/18/21 197 lb 3.2 oz (89.4 kg)  02/16/21 194 lb 6.4 oz (88.2 kg)  01/28/21 192 lb 3.2 oz (87.2 kg)  01/07/21 196 lb 8 oz (89.1 kg)  12/17/20 196 lb 1.6 oz (89 kg)    BMI Readings from Last 5 Encounters:  02/18/21 28.30 kg/m  02/16/21 27.89 kg/m  01/28/21 27.58 kg/m  01/07/21 28.19 kg/m  12/17/20 28.14 kg/m     Physical Exam General: Sitting up in exam chair, in NAD Eyes: EOMI, no icterus Respiratory: bronchial breath sounds on inspiration throughout the right particularly mid to upper lung fields, left clear Cardiovascular: Borderline tachycardic, no murmurs Extremities: No edema, warm   Assessment & Plan:   Productive cough, CT/CXR infiltrate: CT/PET 09/10/20 with improved RML and RLL infiltrates with persistent mixed consolidation and atelectasis RUL. Still with productive cough.  Stop doxycycline.  Continue prednisone.  Would like to taper prednisone as well.  If need to resume doxycycline would then try to taper prednisone.  Dyspnea exertion, weakness: In the setting of lung cancer with severe deconditioning due to therapy as well as severe deconditioning due to prolonged pulmonary infection over the last several months requires ongoing treatment.  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 His weakness would be a risk to himself as well as other driver/computers on the road where he to return to work at this time.   Return in about 3  months (around 05/19/2021).   Lanier Clam, MD 02/23/2021

## 2021-02-24 ENCOUNTER — Encounter: Payer: Self-pay | Admitting: Internal Medicine

## 2021-02-24 NOTE — Telephone Encounter (Signed)
Dr. Silas Flood please advise on patient mychart message. Thank you    Richard Li has started back on the antibiotic. For the past few days his congestion has increased. It's more frequent and thicker. He's been on Doxycycline for a year now. Dr Silas Flood had him to stop taking it and see how he did.  He's also been taking Mucinex DM twice a day for the same amount of time.  He said it's chunkier and green now.  If you have any questions please feel free to give him a call @ [phone].

## 2021-03-01 ENCOUNTER — Encounter: Payer: Self-pay | Admitting: Internal Medicine

## 2021-03-01 ENCOUNTER — Encounter: Payer: BC Managed Care – PPO | Admitting: Physician Assistant

## 2021-03-01 ENCOUNTER — Other Ambulatory Visit: Payer: Self-pay

## 2021-03-01 ENCOUNTER — Other Ambulatory Visit: Payer: BC Managed Care – PPO

## 2021-03-01 DIAGNOSIS — C3491 Malignant neoplasm of unspecified part of right bronchus or lung: Secondary | ICD-10-CM

## 2021-03-01 NOTE — Telephone Encounter (Signed)
I spoke with pt this afternoon and he states his pain is in his low back about his belt area. He states the pain started Friday and has not gotten better. He also states he has a bruise near his spine area that is a little smaller than a soft ball. Pt denies fever, change in SOB or dizziness. Pt states he hasn't fallen nor does he recall bumping into anything.  Discussed with Dr. Julien Nordmann who states pt can be see in [facility] or he may be able to see him on Thursday.   I have arranged with Ardyth Harps in [facility] for pt to be seen Wednesday at 10am. She requests STAT lumbar xray, CBC and sample to BB. Ordres have been entered.   I spoke with pt to advise of this. He understands he will have labs, xray and office visit in [facility] on Wednesday. Pt states he can get his xray tomorrow 03/02/21, which pt was advised was ok.

## 2021-03-02 ENCOUNTER — Inpatient Hospital Stay: Payer: BC Managed Care – PPO | Attending: Internal Medicine | Admitting: Physician Assistant

## 2021-03-02 ENCOUNTER — Other Ambulatory Visit: Payer: Self-pay

## 2021-03-02 ENCOUNTER — Other Ambulatory Visit: Payer: Self-pay | Admitting: Internal Medicine

## 2021-03-02 ENCOUNTER — Ambulatory Visit (HOSPITAL_COMMUNITY)
Admission: RE | Admit: 2021-03-02 | Discharge: 2021-03-02 | Disposition: A | Discharge status: Home / Self Care | Payer: BC Managed Care – PPO | Source: Ambulatory Visit | Attending: Physician Assistant | Admitting: Physician Assistant

## 2021-03-02 ENCOUNTER — Telehealth: Payer: Self-pay

## 2021-03-02 DIAGNOSIS — C342 Malignant neoplasm of middle lobe, bronchus or lung: Secondary | ICD-10-CM | POA: Insufficient documentation

## 2021-03-02 DIAGNOSIS — C3412 Malignant neoplasm of upper lobe, left bronchus or lung: Secondary | ICD-10-CM | POA: Insufficient documentation

## 2021-03-02 DIAGNOSIS — Z79899 Other long term (current) drug therapy: Secondary | ICD-10-CM | POA: Insufficient documentation

## 2021-03-02 DIAGNOSIS — C3491 Malignant neoplasm of unspecified part of right bronchus or lung: Secondary | ICD-10-CM

## 2021-03-02 DIAGNOSIS — C7951 Secondary malignant neoplasm of bone: Secondary | ICD-10-CM | POA: Insufficient documentation

## 2021-03-02 DIAGNOSIS — Z5112 Encounter for antineoplastic immunotherapy: Secondary | ICD-10-CM | POA: Insufficient documentation

## 2021-03-02 DIAGNOSIS — M549 Dorsalgia, unspecified: Secondary | ICD-10-CM | POA: Diagnosis not present

## 2021-03-02 DIAGNOSIS — Z5111 Encounter for antineoplastic chemotherapy: Secondary | ICD-10-CM | POA: Insufficient documentation

## 2021-03-02 MED ORDER — OXYCODONE-ACETAMINOPHEN 5-325 MG PO TABS
1.0000 | ORAL_TABLET | Freq: Four times a day (QID) | ORAL | 0 refills | Status: DC | PRN
Start: 2021-03-02 — End: 2021-03-11

## 2021-03-02 NOTE — Progress Notes (Signed)
Symptom Management Consult note [facility]    I connected with Richard Li [id number]  1960-10-03     on 03/02/21 at  2:15 PM EST by telephone and verified that I am speaking with the correct person using two identifiers.   I discussed the limitations, risks, security and privacy concerns of performing an evaluation and management service by telemedicine and the availability of in-person appointments. I also discussed with the patient that there may be a patient responsible charge related to this service. The patient expressed understanding and agreed to proceed.    Patient's location: home Provider's location: office at [place] [facility]   Chief Complaint: back pain   Patient Care Team: [name], DO as PCP - General (Family Medicine) Valrie Hart, RN as Oncology Nurse Navigator     Oncology History  Adenocarcinoma of right lung, stage 2 [facility])  01/17/2020 Initial Diagnosis   Adenocarcinoma of right lung, stage 2 ([place])   02/10/2020 - 03/09/2020 Chemotherapy          09/24/2020 -  Chemotherapy   Patient is on Treatment Plan : LUNG CARBOplatin / Pemetrexed / Pembrolizumab q21d Induction x 4 cycles / Maintenance Pemetrexed + Pembrolizumab     Adenocarcinoma of right lung, stage 4 ([place])  08/28/2020 Initial Diagnosis   Adenocarcinoma of right lung, stage 4 ([place])   08/28/2020 Cancer Staging   Staging form: Lung, AJCC 8th Edition - Clinical: Stage IVA (cT3, cN0, cM1b) - Signed by Curt Bears, MD on 08/28/2020      Current Therapy: palliative systemic chemotherapy carboplatin day 1 cycle 4 11/25/20. Pembrolizumab, pemetrexed day 1 cycle 8 02/18/21. Palliative radiation.  Interval history- Richard Li is a 60 yo male with history as listed above presenting to [facility] today via telephone with chief complaint of back pain x 1 week. Patient reports pain is located in the middle of his lower back and radiates to his waist describing the location "as where my  belt would sit." He denies any known injury or trauma. He admits to recently trying to increase his activity and build up his strength although has not completed any strenuous activities that he can recall. He reports his spouse saw a bruise on his back x 4 days ago when he was changing. The area was deep purple initially, now describes it as a light purple color. That specific area is non tender to palpation he admits. Patient has been taking tylenol when pain is mild/moderate with only minimal short term improvement. When pain is severe he takes oxycodone he had from leftover prescription and that does significantly improve pain. He only had a few pills left, needs a new prescription. He denies any fever, chills, shortness of breath, chest pain, gross hematuria, dysuria, urinary retention or incontinence, bowel incontinence or retention, numbness, tingling, weakness, night sweats, weight loss. He denies difficulty with ambulation.  ROS  All other systems are reviewed and are negative for acute change except as noted in the HPI.    Allergies  Allergen Reactions   Penicillins     Unknown reaction   Amoxicillin Rash     Past Medical History:  Diagnosis Date   Allergic rhinitis, cause unspecified    Anxiety state, unspecified    Asthma    as a child   Chronic airway obstruction, not elsewhere classified    Esophageal reflux    History of kidney stones    History of radiation therapy 02/13/2020-03/24/2020   right lung  Dr Gery Pray   Hypertension    Lumbago    lung ca dx'd 12/2019   Other and unspecified hyperlipidemia    Other chest pain      Past Surgical History:  Procedure Laterality Date   APPENDECTOMY     BRONCHIAL BIOPSY  01/07/2020   Procedure: BRONCHIAL BIOPSIES;  Surgeon: Collene Gobble, MD;  Location: [facility] ENDOSCOPY;  Service: Pulmonary;;   BRONCHIAL BIOPSY  04/05/2020   Procedure: BRONCHIAL BIOPSIES;  Surgeon: Rigoberto Noel, MD;  Location: [place];   Service: Cardiopulmonary;;   BRONCHIAL BRUSHINGS  01/07/2020   Procedure: BRONCHIAL BRUSHINGS;  Surgeon: Collene Gobble, MD;  Location: [facility] ENDOSCOPY;  Service: Pulmonary;;   BRONCHIAL NEEDLE ASPIRATION BIOPSY  01/07/2020   Procedure: BRONCHIAL NEEDLE ASPIRATION BIOPSIES;  Surgeon: Collene Gobble, MD;  Location: [facility] ENDOSCOPY;  Service: Pulmonary;;   BRONCHIAL WASHINGS  01/07/2020   Procedure: BRONCHIAL WASHINGS;  Surgeon: Collene Gobble, MD;  Location: [facility] ENDOSCOPY;  Service: Pulmonary;;   BRONCHIAL WASHINGS  04/05/2020   Procedure: BRONCHIAL WASHINGS;  Surgeon: Rigoberto Noel, MD;  Location: [place];  Service: Cardiopulmonary;;   COLONOSCOPY  15 years ago   HEMOSTASIS CONTROL  01/07/2020   Procedure: HEMOSTASIS CONTROL;  Surgeon: Collene Gobble, MD;  Location: [facility] ENDOSCOPY;  Service: Pulmonary;;  cold saline   IR IMAGING GUIDED PORT INSERTION  09/18/2020   NASAL TURBINATE REDUCTION  2002   Dr.Crossley   VASECTOMY     VIDEO BRONCHOSCOPY Right 04/05/2020   Procedure: VIDEO BRONCHOSCOPY WITH FLUORO;  Surgeon: Rigoberto Noel, MD;  Location: [facility];  Service: Cardiopulmonary;  Laterality: Right;   VIDEO BRONCHOSCOPY WITH ENDOBRONCHIAL NAVIGATION N/A 01/07/2020   Procedure: VIDEO BRONCHOSCOPY WITH ENDOBRONCHIAL NAVIGATION;  Surgeon: Collene Gobble, MD;  Location: [place] ENDOSCOPY;  Service: Pulmonary;  Laterality: N/A;   VIDEO BRONCHOSCOPY WITH ENDOBRONCHIAL ULTRASOUND N/A 01/07/2020   Procedure: VIDEO BRONCHOSCOPY WITH ENDOBRONCHIAL ULTRASOUND;  Surgeon: Collene Gobble, MD;  Location: [place] ENDOSCOPY;  Service: Pulmonary;  Laterality: N/A;    Social History   Socioeconomic History   Marital status: Married    Spouse name: Designer, industrial/product   Number of children: Not on file   Years of education: Not on file   Highest education level: Not on file  Occupational History   Occupation: truck Education administrator: [employer]  Tobacco Use   Smoking status: Former    Packs/day: 2.00    Years:  28.00    Pack years: 56.00    Types: Cigarettes    Quit date: 04/25/2005    Years since quitting: 15.8   Smokeless tobacco: Never  Vaping Use   Vaping Use: Never used  Substance and Sexual Activity   Alcohol use: Yes    Alcohol/week: 12.0 standard drinks    Types: 12 Cans of beer per week    Comment: social use   Drug use: No   Sexual activity: Not on file  Other Topics Concern   Not on file  Social History Narrative   Not on file   Social Determinants of Health   Financial Resource Strain: Not on file  Food Insecurity: Not on file  Transportation Needs: Not on file  Physical Activity: Not on file  Stress: Not on file  Social Connections: Not on file  Intimate Partner Violence: Not on file    Family History  Problem Relation Age of Onset   Breast cancer Mother    Cancer Father  Heart attack Brother    Emphysema Maternal Uncle    COPD Maternal Uncle    Heart disease Maternal Uncle    Colon cancer Neg Hx      Current Outpatient Medications:    acetaminophen (TYLENOL) 500 MG tablet, Take 1,000 mg by mouth 2 (two) times daily., Disp: , Rfl:    Brimonidine Tartrate (LUMIFY) 0.025 % SOLN, Place 1 drop into both eyes 2 (two) times a week. As needed (Patient not taking: Reported on 02/16/2021), Disp: , Rfl:    COMBIVENT RESPIMAT 20-100 MCG/ACT AERS respimat, INHALE 1 TO 2 PUFFS BY MOUTH EVERY 6 HOURS AS NEEDED FOR WHEEZING, Disp: 4 g, Rfl: 5   dextromethorphan-guaiFENesin (MUCINEX DM) 30-600 MG 12hr tablet, Take 1 tablet by mouth 2 (two) times daily., Disp: , Rfl:    feeding supplement (ENSURE ENLIVE / ENSURE PLUS) LIQD, Take 237 mLs by mouth 3 (three) times daily between meals. (Patient taking differently: Take 237 mLs by mouth 2 (two) times daily between meals.), Disp: 237 mL, Rfl: 12   fenofibrate 160 MG tablet, Take 1 tablet by mouth once daily (Patient taking differently: Take 160 mg by mouth daily.), Disp: 90 tablet, Rfl: 3   ferrous sulfate 325 (65 FE) MG tablet, Take  1 tablet (325 mg total) by mouth daily with breakfast., Disp: 30 tablet, Rfl: 0   folic acid (FOLVITE) 1 MG tablet, Take 1 tablet by mouth once daily, Disp: 30 tablet, Rfl: 0   lactulose (CHRONULAC) 10 GM/15ML solution, Take 15 mLs (10 g total) by mouth 3 (three) times daily., Disp: 236 mL, Rfl: 0   lidocaine-prilocaine (EMLA) cream, Apply 1 application topically as needed., Disp: 30 g, Rfl: 2   LORazepam (ATIVAN) 1 MG tablet, Take 1 tablet (1 mg total) by mouth every 8 (eight) hours as needed for anxiety., Disp: 90 tablet, Rfl: 5   meclizine (ANTIVERT) 25 MG tablet, Take 1 tablet (25 mg total) by mouth every 6 (six) hours as needed for dizziness., Disp: 60 tablet, Rfl: 0   Multiple Vitamin (MULTIVITAMIN WITH MINERALS) TABS tablet, Take 1 tablet by mouth daily., Disp: , Rfl:    ondansetron (ZOFRAN) 8 MG tablet, Take 1 tablet (8 mg total) by mouth every 8 (eight) hours as needed for nausea or vomiting. Starting day 3 after chemotherapy, Disp: 30 tablet, Rfl: 2   oxyCODONE-acetaminophen (PERCOCET/ROXICET) 5-325 MG tablet, Take 1 tablet by mouth every 6 (six) hours as needed for severe pain., Disp: 30 tablet, Rfl: 0   potassium chloride SA (KLOR-CON) 20 MEQ tablet, Take 1 tablet (20 mEq total) by mouth daily. (Patient not taking: Reported on 02/16/2021), Disp: 5 tablet, Rfl: 0   predniSONE (DELTASONE) 10 MG tablet, Take 1 tablet (10 mg total) by mouth daily with breakfast., Disp: 90 tablet, Rfl: 0   prochlorperazine (COMPAZINE) 10 MG tablet, Take 1 tablet (10 mg total) by mouth every 6 (six) hours as needed., Disp: 30 tablet, Rfl: 2 No current facility-administered medications for this visit.  Facility-Administered Medications Ordered in Other Visits:    0.9 %  sodium chloride infusion, , Intravenous, Continuous, Curt Bears, MD, Stopped at 03/05/20 1410    LABORATORY DATA: I have reviewed the data as listed CBC Latest Ref Rng & Units 02/18/2021 01/28/2021 01/07/2021  WBC 4.0 - 10.5 K/uL 7.5  6.2 6.6  Hemoglobin 13.0 - 17.0 g/dL 10.3(L) 10.5(L) 10.0(L)  Hematocrit 39.0 - 52.0 % 29.9(L) 30.3(L) 29.7(L)  Platelets 150 - 400 K/uL 220 284 245  CMP Latest Ref Rng & Units 02/18/2021 01/28/2021 01/07/2021  Glucose 70 - 99 mg/dL 94 99 86  BUN 6 - 20 mg/dL 11 20 10   Creatinine 0.61 - 1.24 mg/dL 1.00 1.07 0.87  Sodium 135 - 145 mmol/L 139 139 140  Potassium 3.5 - 5.1 mmol/L 3.5 3.2(L) 3.3(L)  Chloride 98 - 111 mmol/L 106 106 106  CO2 22 - 32 mmol/L 23 23 26   Calcium 8.9 - 10.3 mg/dL 9.3 9.4 9.3  Total Protein 6.5 - 8.1 g/dL 6.7 7.0 6.6  Total Bilirubin 0.3 - 1.2 mg/dL 0.5 0.5 0.4  Alkaline Phos 38 - 126 U/L 80 94 89  AST 15 - 41 U/L 22 26 23   ALT 0 - 44 U/L 11 12 11        RADIOGRAPHIC STUDIES: I have personally reviewed the radiological images as listed and agreed with the findings in the report. No images are attached to the encounter.  DG Lumbar Spine 2-3 Views  Result Date: 03/02/2021 CLINICAL DATA:  Low back pain EXAM: LUMBAR SPINE - 2-3 VIEW COMPARISON:  CT abdomen from 02/15/2021 FINDINGS: Blunting of the right posterior costophrenic angle compatible with small right pleural effusion is shown on 02/15/2021. Subacute T12 superior endplate compression fracture, with about 35% compression (not substantially changed compared to CT from 02/15/2021). No new fracture or subluxation identified. Atherosclerosis is present, including aortoiliac atherosclerotic disease. IMPRESSION: 1. Stable 35% subacute superior endplate compression fracture at T12. No further compression identified compared to 02/15/2021. 2. Small right pleural effusion as shown previously. 3.  Aortic Atherosclerosis (ICD10-I70.0). Electronically Signed   By: Van Clines M.D.   On: 03/02/2021 09:51    ASSESSMENT & PLAN: Patient is a 60 y.o. male with history of metastatic non-small cell lung cancer stage IV currently on palliative systemic chemotherapy followed by oncologist Dr. Julien Nordmann.  #) Back  pain-patient had x-ray of lumbar spine showing stable 35% subacute superior endplate compression fracture at T12.  Dr. Julien Nordmann aware of imaging results and agrees with plan to have he patient follow up with his established orthopedist if he continues to have pain. I discussed results with patient and he does not recall any history of back injuries or fracture, unsure when this happened however it has been noted on multiple previous images. Pain controlled with tylenol and oxycodone. Per RN refill request for oxycodone sent to Dr. Julien Nordmann. Patient denied any red flags for back pain. He knows to call clinic if pain worsens and discussed ED precautions.  #)metastatic non-small cell lung cancer stage IV- Continue treatment per primary oncologist. Patient has appointment scheduled for next week with Dr. Julien Nordmann for eval and treatment 11/17, he plans to keep for follow up of back pain as well.   Visit Diagnosis: 1. Acute back pain, unspecified back location, unspecified back pain laterality   2. Adenocarcinoma of right lung, stage 4 (H[facility])      No orders of the defined types were placed in this encounter.   All questions were answered. The patient knows to call the clinic with any problems, questions or concerns. No barriers to learning was detected.  I have spent a total of 15 minutes minutes of  non-face-to-face time, preparing to see the patient, obtaining and/or reviewing separately obtained history,  counseling and educating the patient, ordering tests,  documenting clinical information in the electronic health record, and care coordination.     Thank you for allowing me to participate in the care of this patient.    Verline Lema  Terrilyn Saver, PA-C Department of Hematology/Oncology [facility] at [facility] Phone: [phone]  [phone]    03/02/2021 5:35 PM

## 2021-03-02 NOTE — Telephone Encounter (Signed)
Pt requests a refill of Oxycodone

## 2021-03-03 ENCOUNTER — Inpatient Hospital Stay: Payer: BC Managed Care – PPO | Admitting: Physician Assistant

## 2021-03-03 ENCOUNTER — Other Ambulatory Visit: Payer: Self-pay | Admitting: Pulmonary Disease

## 2021-03-03 ENCOUNTER — Inpatient Hospital Stay: Payer: BC Managed Care – PPO

## 2021-03-04 NOTE — Telephone Encounter (Signed)
Received a refill request for doxycycline. At the visit on 02/16/21, he was told to D/C doxy. Patient's wife sent a MyChart message on 02/24/21 stating that his congestion had returned and he had to go back on the doxy.   MH, please advise if you are ok with this refill. Thanks!

## 2021-03-11 ENCOUNTER — Other Ambulatory Visit: Payer: Self-pay

## 2021-03-11 ENCOUNTER — Inpatient Hospital Stay (HOSPITAL_BASED_OUTPATIENT_CLINIC_OR_DEPARTMENT_OTHER): Payer: BC Managed Care – PPO | Admitting: Internal Medicine

## 2021-03-11 ENCOUNTER — Encounter: Payer: Self-pay | Admitting: Internal Medicine

## 2021-03-11 ENCOUNTER — Inpatient Hospital Stay: Payer: BC Managed Care – PPO

## 2021-03-11 ENCOUNTER — Other Ambulatory Visit: Payer: Self-pay | Admitting: Physician Assistant

## 2021-03-11 ENCOUNTER — Other Ambulatory Visit (HOSPITAL_COMMUNITY): Payer: Self-pay

## 2021-03-11 VITALS — BP 119/78 | HR 88 | Temp 98.0°F | Resp 18 | Ht 70.0 in | Wt 199.8 lb

## 2021-03-11 DIAGNOSIS — Z79899 Other long term (current) drug therapy: Secondary | ICD-10-CM | POA: Diagnosis not present

## 2021-03-11 DIAGNOSIS — C342 Malignant neoplasm of middle lobe, bronchus or lung: Secondary | ICD-10-CM | POA: Diagnosis not present

## 2021-03-11 DIAGNOSIS — Z5111 Encounter for antineoplastic chemotherapy: Secondary | ICD-10-CM | POA: Diagnosis present

## 2021-03-11 DIAGNOSIS — E876 Hypokalemia: Secondary | ICD-10-CM

## 2021-03-11 DIAGNOSIS — D649 Anemia, unspecified: Secondary | ICD-10-CM

## 2021-03-11 DIAGNOSIS — Z5112 Encounter for antineoplastic immunotherapy: Secondary | ICD-10-CM

## 2021-03-11 DIAGNOSIS — C3491 Malignant neoplasm of unspecified part of right bronchus or lung: Secondary | ICD-10-CM

## 2021-03-11 DIAGNOSIS — Z95828 Presence of other vascular implants and grafts: Secondary | ICD-10-CM

## 2021-03-11 DIAGNOSIS — C7901 Secondary malignant neoplasm of right kidney and renal pelvis: Secondary | ICD-10-CM | POA: Diagnosis not present

## 2021-03-11 DIAGNOSIS — C3412 Malignant neoplasm of upper lobe, left bronchus or lung: Secondary | ICD-10-CM | POA: Diagnosis not present

## 2021-03-11 DIAGNOSIS — C7951 Secondary malignant neoplasm of bone: Secondary | ICD-10-CM | POA: Diagnosis not present

## 2021-03-11 LAB — CBC WITH DIFFERENTIAL (CANCER CENTER ONLY)
Abs Immature Granulocytes: 0.03 10*3/uL (ref 0.00–0.07)
Basophils Absolute: 0 10*3/uL (ref 0.0–0.1)
Basophils Relative: 1 %
Eosinophils Absolute: 0.1 10*3/uL (ref 0.0–0.5)
Eosinophils Relative: 1 %
HCT: 28.8 % — ABNORMAL LOW (ref 39.0–52.0)
Hemoglobin: 10 g/dL — ABNORMAL LOW (ref 13.0–17.0)
Immature Granulocytes: 1 %
Lymphocytes Relative: 15 %
Lymphs Abs: 0.9 10*3/uL (ref 0.7–4.0)
MCH: 33 pg (ref 26.0–34.0)
MCHC: 34.7 g/dL (ref 30.0–36.0)
MCV: 95 fL (ref 80.0–100.0)
Monocytes Absolute: 0.9 10*3/uL (ref 0.1–1.0)
Monocytes Relative: 16 %
Neutro Abs: 3.8 10*3/uL (ref 1.7–7.7)
Neutrophils Relative %: 66 %
Platelet Count: 244 10*3/uL (ref 150–400)
RBC: 3.03 MIL/uL — ABNORMAL LOW (ref 4.22–5.81)
RDW: 15.1 % (ref 11.5–15.5)
WBC Count: 5.7 10*3/uL (ref 4.0–10.5)
nRBC: 0 % (ref 0.0–0.2)

## 2021-03-11 LAB — CMP (CANCER CENTER ONLY)
ALT: 12 U/L (ref 0–44)
AST: 26 U/L (ref 15–41)
Albumin: 3.1 g/dL — ABNORMAL LOW (ref 3.5–5.0)
Alkaline Phosphatase: 92 U/L (ref 38–126)
Anion gap: 9 (ref 5–15)
BUN: 11 mg/dL (ref 6–20)
CO2: 25 mmol/L (ref 22–32)
Calcium: 9.1 mg/dL (ref 8.9–10.3)
Chloride: 106 mmol/L (ref 98–111)
Creatinine: 0.89 mg/dL (ref 0.61–1.24)
GFR, Estimated: >60 mL/min (ref >60)
Glucose, Bld: 85 mg/dL (ref 70–99)
Potassium: 3.2 mmol/L — ABNORMAL LOW (ref 3.5–5.1)
Sodium: 140 mmol/L (ref 135–145)
Total Bilirubin: 0.4 mg/dL (ref 0.3–1.2)
Total Protein: 6.6 g/dL (ref 6.5–8.1)

## 2021-03-11 LAB — TSH: TSH: 1.375 u[IU]/mL (ref 0.320–4.118)

## 2021-03-11 LAB — SAMPLE TO BLOOD BANK

## 2021-03-11 MED ORDER — SODIUM CHLORIDE 0.9 % IV SOLN: Freq: Once | INTRAVENOUS | Status: AC

## 2021-03-11 MED ORDER — SODIUM CHLORIDE 0.9 % IV SOLN
200.0000 mg | Freq: Once | INTRAVENOUS | Status: AC
  Administered 2021-03-11: 11:00:00 200 mg via INTRAVENOUS
  Filled 2021-03-11: qty 8

## 2021-03-11 MED ORDER — PROCHLORPERAZINE MALEATE 10 MG PO TABS
10.0000 mg | ORAL_TABLET | Freq: Once | ORAL | Status: AC
  Administered 2021-03-11: 11:00:00 10 mg via ORAL
  Filled 2021-03-11: qty 1

## 2021-03-11 MED ORDER — OXYCODONE-ACETAMINOPHEN 5-325 MG PO TABS
1.0000 | ORAL_TABLET | Freq: Four times a day (QID) | ORAL | 0 refills | Status: DC | PRN
  Filled 2021-03-11: qty 30, 8d supply, fill #0

## 2021-03-11 MED ORDER — SODIUM CHLORIDE 0.9% FLUSH
10.0000 mL | INTRAVENOUS | Status: DC | PRN
  Administered 2021-03-11: 12:00:00 10 mL

## 2021-03-11 MED ORDER — SODIUM CHLORIDE 0.9% FLUSH
10.0000 mL | Freq: Once | INTRAVENOUS | Status: AC
  Administered 2021-03-11: 09:00:00 10 mL via INTRAVENOUS

## 2021-03-11 MED ORDER — POTASSIUM CHLORIDE CRYS ER 20 MEQ PO TBCR: 20.0000 meq | EXTENDED_RELEASE_TABLET | Freq: Every day | ORAL | 0 refills | Status: DC

## 2021-03-11 MED ORDER — SODIUM CHLORIDE 0.9 % IV SOLN
500.0000 mg/m2 | Freq: Once | INTRAVENOUS | Status: AC
  Administered 2021-03-11: 12:00:00 1000 mg via INTRAVENOUS
  Filled 2021-03-11: qty 40

## 2021-03-11 MED ORDER — HEPARIN SOD (PORK) LOCK FLUSH 100 UNIT/ML IV SOLN
500.0000 [IU] | Freq: Once | INTRAVENOUS | Status: AC | PRN
  Administered 2021-03-11: 12:00:00 500 [IU]

## 2021-03-11 NOTE — Progress Notes (Signed)
[facility] [phone]   [phone]  OFFICE PROGRESS NOTE  [name], DO [address]  [address] [place] [address]  DIAGNOSIS: Metastatic non-small cell lung cancer initially diagnosed as stage IIB (T3, N0, M0) non-small cell lung cancer, adenocarcinoma presented with large central perihilar mass with suspicious groundglass opacity in the right middle lobe and left upper lobe diagnosed in September 2021.  The patient had evidence of metastatic disease to the right kidney in March 2022.  Molecular studies by guardant 360: No actionable mutations.   PDl1: 80%   PRIOR THERAPY: Concurrent chemoradiation with weekly carboplatin for AUC of 2 and paclitaxel 45 mg/M2.  First dose February 10, 2020.  Status post 5 cycles.   CURRENT THERAPY:  1) Palliative systemic chemotherapy with carboplatin AUC of 5, Alimta 500 mg/M2, Keytruda 200 mg IV every 3 weeks.  First dose expected on 09/24/2020. Status post 8 cycles.  Starting from cycle #5 the patient is on maintenance treatment with Alimta and Keytruda every 3 weeks. 2) Palliative radiation to the left anterior rib cage area as well as the mid back area under the care of Dr. Sondra Come. Last treatment on 10/05/20.  INTERVAL HISTORY: Richard Li 60 y.o. male returns to the clinic today for follow-up visit.  The patient is feeling fine today with no concerning complaints except for right flank pain.  The patient lays on the couch most of the days with no activities.  He denied having any current chest pain, shortness of breath, cough or hemoptysis.  He denied having any fever or chills.  He has no nausea, vomiting, diarrhea or constipation.  He has no headache or visual changes.  He denied having any significant weight loss or night sweats.  He continues to tolerate his maintenance treatment with Alimta and Keytruda fairly well.  He is here today for evaluation before starting cycle #9.   MEDICAL  HISTORY: Past Medical History:  Diagnosis Date   Allergic rhinitis, cause unspecified    Anxiety state, unspecified    Asthma    as a child   Chronic airway obstruction, not elsewhere classified    Esophageal reflux    History of kidney stones    History of radiation therapy 02/13/2020-03/24/2020   right lung        Dr Gery Pray   Hypertension    Lumbago    lung ca dx'd 12/2019   Other and unspecified hyperlipidemia    Other chest pain     ALLERGIES:  is allergic to penicillins and amoxicillin.  MEDICATIONS:  Current Outpatient Medications  Medication Sig Dispense Refill   doxycycline (VIBRA-TABS) 100 MG tablet Take 1 tablet by mouth twice daily 60 tablet 2   acetaminophen (TYLENOL) 500 MG tablet Take 1,000 mg by mouth 2 (two) times daily.     Brimonidine Tartrate (LUMIFY) 0.025 % SOLN Place 1 drop into both eyes 2 (two) times a week. As needed (Patient not taking: Reported on 02/16/2021)     COMBIVENT RESPIMAT 20-100 MCG/ACT AERS respimat INHALE 1 TO 2 PUFFS BY MOUTH EVERY 6 HOURS AS NEEDED FOR WHEEZING 4 g 5   dextromethorphan-guaiFENesin (MUCINEX DM) 30-600 MG 12hr tablet Take 1 tablet by mouth 2 (two) times daily.     feeding supplement (ENSURE ENLIVE / ENSURE PLUS) LIQD Take 237 mLs by mouth 3 (three) times daily between meals. (Patient taking differently: Take 237 mLs by mouth 2 (two) times daily between  meals.) 237 mL 12   fenofibrate 160 MG tablet Take 1 tablet by mouth once daily (Patient taking differently: Take 160 mg by mouth daily.) 90 tablet 3   ferrous sulfate 325 (65 FE) MG tablet Take 1 tablet (325 mg total) by mouth daily with breakfast. 30 tablet 0   folic acid (FOLVITE) 1 MG tablet Take 1 tablet by mouth once daily 30 tablet 0   lactulose (CHRONULAC) 10 GM/15ML solution Take 15 mLs (10 g total) by mouth 3 (three) times daily. 236 mL 0   lidocaine-prilocaine (EMLA) cream Apply 1 application topically as needed. 30 g 2   LORazepam (ATIVAN) 1 MG tablet Take 1  tablet (1 mg total) by mouth every 8 (eight) hours as needed for anxiety. 90 tablet 5   meclizine (ANTIVERT) 25 MG tablet Take 1 tablet (25 mg total) by mouth every 6 (six) hours as needed for dizziness. 60 tablet 0   Multiple Vitamin (MULTIVITAMIN WITH MINERALS) TABS tablet Take 1 tablet by mouth daily.     ondansetron (ZOFRAN) 8 MG tablet Take 1 tablet (8 mg total) by mouth every 8 (eight) hours as needed for nausea or vomiting. Starting day 3 after chemotherapy 30 tablet 2   oxyCODONE-acetaminophen (PERCOCET/ROXICET) 5-325 MG tablet Take 1 tablet by mouth every 6 (six) hours as needed for severe pain. 30 tablet 0   potassium chloride SA (KLOR-CON) 20 MEQ tablet Take 1 tablet (20 mEq total) by mouth daily. (Patient not taking: Reported on 02/16/2021) 5 tablet 0   predniSONE (DELTASONE) 10 MG tablet Take 1 tablet (10 mg total) by mouth daily with breakfast. 90 tablet 0   prochlorperazine (COMPAZINE) 10 MG tablet Take 1 tablet (10 mg total) by mouth every 6 (six) hours as needed. 30 tablet 2   No current facility-administered medications for this visit.   Facility-Administered Medications Ordered in Other Visits  Medication Dose Route Frequency Provider Last Rate Last Admin   0.9 %  sodium chloride infusion   Intravenous Continuous Curt Bears, MD   Stopped at 03/05/20 1410    SURGICAL HISTORY:  Past Surgical History:  Procedure Laterality Date   APPENDECTOMY     BRONCHIAL BIOPSY  01/07/2020   Procedure: BRONCHIAL BIOPSIES;  Surgeon: Collene Gobble, MD;  Location: [facility] ENDOSCOPY;  Service: Pulmonary;;   BRONCHIAL BIOPSY  04/05/2020   Procedure: BRONCHIAL BIOPSIES;  Surgeon: Rigoberto Noel, MD;  Location: [facility];  Service: Cardiopulmonary;;   BRONCHIAL BRUSHINGS  01/07/2020   Procedure: BRONCHIAL BRUSHINGS;  Surgeon: Collene Gobble, MD;  Location: [facility] ENDOSCOPY;  Service: Pulmonary;;   BRONCHIAL NEEDLE ASPIRATION BIOPSY  01/07/2020   Procedure: BRONCHIAL NEEDLE ASPIRATION BIOPSIES;   Surgeon: Collene Gobble, MD;  Location: [facility] ENDOSCOPY;  Service: Pulmonary;;   BRONCHIAL WASHINGS  01/07/2020   Procedure: BRONCHIAL WASHINGS;  Surgeon: Collene Gobble, MD;  Location: [facility] ENDOSCOPY;  Service: Pulmonary;;   BRONCHIAL WASHINGS  04/05/2020   Procedure: BRONCHIAL WASHINGS;  Surgeon: Rigoberto Noel, MD;  Location: [facility];  Service: Cardiopulmonary;;   COLONOSCOPY  15 years ago   HEMOSTASIS CONTROL  01/07/2020   Procedure: HEMOSTASIS CONTROL;  Surgeon: Collene Gobble, MD;  Location: [facility] ENDOSCOPY;  Service: Pulmonary;;  cold saline   IR IMAGING GUIDED PORT INSERTION  09/18/2020   NASAL TURBINATE REDUCTION  2002   Dr.Crossley   VASECTOMY     VIDEO BRONCHOSCOPY Right 04/05/2020   Procedure: VIDEO BRONCHOSCOPY WITH FLUORO;  Surgeon: Rigoberto Noel, MD;  Location: [facility]  ENDOSCOPY;  Service: Cardiopulmonary;  Laterality: Right;   VIDEO BRONCHOSCOPY WITH ENDOBRONCHIAL NAVIGATION N/A 01/07/2020   Procedure: VIDEO BRONCHOSCOPY WITH ENDOBRONCHIAL NAVIGATION;  Surgeon: Collene Gobble, MD;  Location: [facility] ENDOSCOPY;  Service: Pulmonary;  Laterality: N/A;   VIDEO BRONCHOSCOPY WITH ENDOBRONCHIAL ULTRASOUND N/A 01/07/2020   Procedure: VIDEO BRONCHOSCOPY WITH ENDOBRONCHIAL ULTRASOUND;  Surgeon: Collene Gobble, MD;  Location: [facility] ENDOSCOPY;  Service: Pulmonary;  Laterality: N/A;    REVIEW OF SYSTEMS:  A comprehensive review of systems was negative except for: Constitutional: positive for fatigue Musculoskeletal: positive for arthralgias   PHYSICAL EXAMINATION: General appearance: alert, cooperative, fatigued, and no distress Head: Normocephalic, without obvious abnormality, atraumatic Neck: no adenopathy, no JVD, supple, symmetrical, trachea midline, and thyroid not enlarged, symmetric, no tenderness/mass/nodules Lymph nodes: Cervical, supraclavicular, and axillary nodes normal. Resp: clear to auscultation bilaterally Back: symmetric, no curvature. ROM normal. No CVA tenderness. Cardio: regular  rate and rhythm, S1, S2 normal, no murmur, click, rub or gallop GI: soft, non-tender; bowel sounds normal; no masses,  no organomegaly Extremities: extremities normal, atraumatic, no cyanosis or edema  ECOG PERFORMANCE STATUS: 1 - Symptomatic but completely ambulatory  Blood pressure 119/78, pulse 88, temperature 98 F (36.7 C), temperature source Tympanic, resp. rate 18, height 5' 10"  (1.778 m), weight 199 lb 12.8 oz (90.6 kg), SpO2 100 %.  LABORATORY DATA: Lab Results  Component Value Date   WBC 5.7 03/11/2021   HGB 10.0 (L) 03/11/2021   HCT 28.8 (L) 03/11/2021   MCV 95.0 03/11/2021   PLT 244 03/11/2021      Chemistry      Component Value Date/Time   NA 139 02/18/2021 0840   K 3.5 02/18/2021 0840   CL 106 02/18/2021 0840   CO2 23 02/18/2021 0840   BUN 11 02/18/2021 0840   CREATININE 1.00 02/18/2021 0840   CREATININE 0.67 (L) 06/24/2020 1517      Component Value Date/Time   CALCIUM 9.3 02/18/2021 0840   ALKPHOS 80 02/18/2021 0840   AST 22 02/18/2021 0840   ALT 11 02/18/2021 0840   BILITOT 0.5 02/18/2021 0840       RADIOGRAPHIC STUDIES: DG Lumbar Spine 2-3 Views  Result Date: 03/02/2021 CLINICAL DATA:  Low back pain EXAM: LUMBAR SPINE - 2-3 VIEW COMPARISON:  CT abdomen from 02/15/2021 FINDINGS: Blunting of the right posterior costophrenic angle compatible with small right pleural effusion is shown on 02/15/2021. Subacute T12 superior endplate compression fracture, with about 35% compression (not substantially changed compared to CT from 02/15/2021). No new fracture or subluxation identified. Atherosclerosis is present, including aortoiliac atherosclerotic disease. IMPRESSION: 1. Stable 35% subacute superior endplate compression fracture at T12. No further compression identified compared to 02/15/2021. 2. Small right pleural effusion as shown previously. 3.  Aortic Atherosclerosis (ICD10-I70.0). Electronically Signed   By: Van Clines M.D.   On: 03/02/2021 09:51    CT Chest W Contrast  Result Date: 02/16/2021 CLINICAL DATA:  A 60 year old male presents with history of non-small cell lung cancer diagnosed in 2021. Post chemo and radiotherapy, currently on systemic therapy. EXAM: CT CHEST, ABDOMEN, AND PELVIS WITH CONTRAST TECHNIQUE: Multidetector CT imaging of the chest, abdomen and pelvis was performed following the standard protocol during bolus administration of intravenous contrast. CONTRAST:  57m OMNIPAQUE IOHEXOL 350 MG/ML SOLN COMPARISON:  Comparison made with imaging from August and June of 2022 (CT evaluations), also with prior PET scan from May of 2022. FINDINGS: CT CHEST FINDINGS Cardiovascular: RIGHT-sided Port-A-Cath terminates at the caval to atrial junction  as before. Heart size is stable without signs of pericardial effusion of substantial volume, small amount of pericardial fluid is similar to the prior exam. Central pulmonary vasculature is unremarkable. Mediastinum/Nodes: No adenopathy at the thoracic inlet or in the bilateral axillary regions. No mediastinal adenopathy. No hilar adenopathy. Soft tissue extending from the RIGHT hilum peripherally compatible with post treatment changes, similar to previous imaging Lungs/Pleura: Volume loss, bronchiectasis and cystic changes in the RIGHT upper lobe similar to previous imaging. Interval development of nodular area in the LEFT upper lobe (image 95/6) this measures 2.4 x 1.1 cm. Bandlike changes associated with nodularity elsewhere within this area. Nodular area in the LEFT upper lobe anteriorly and medially (image 68/6) site of previous ground-glass on the prior study now measuring 11 x 9 mm. Stable LEFT lower lobe pulmonary nodule (image 107/6) 7 mm. Nodularity extending into the extrapleural fat of the RIGHT upper chest is similar to previous imaging. Musculoskeletal: See below for full musculoskeletal details. CT ABDOMEN PELVIS FINDINGS Hepatobiliary: No focal, suspicious hepatic lesion. No  pericholecystic stranding. No biliary duct dilation. Portal vein is patent. Pancreas: Normal, without mass, inflammation or ductal dilatation. Spleen: Spleen normal size and contour. Adrenals/Urinary Tract: Adrenal glands are normal. Ill-defined area in the RIGHT kidney measuring 2.0 x 1.4 cm previously approximately 2.4 x 1.9 cm. Urinary bladder under distended but without gross abnormality. Stomach/Bowel: No acute gastrointestinal findings. Vascular/Lymphatic: Aortic atherosclerosis. No sign of aneurysm. Smooth contour of the IVC. There is no gastrohepatic or hepatoduodenal ligament lymphadenopathy. No retroperitoneal or mesenteric lymphadenopathy. No pelvic sidewall lymphadenopathy. Reproductive: Unremarkable by CT. Other: No ascites. Musculoskeletal: Subtle sclerosis in LEFT posterior iliac is unchanged. No new suspicious bone lesions though again bony metastases were largely occult on previous imaging. Unchanged appearance of T12 compression fracture IMPRESSION: Interval development of nodular area in the LEFT upper lobe this measures 2.4 x 1.1 cm. This finding is concerning for metastatic disease. Adjacent pleural thickening in this area in the setting of prior radiotherapy makes this area indeterminate with rapid development. Linear border of this on axial images but without bandlike appearance on coronal and sagittal images. Perhaps related to post radiation changes and associated with adjacent nodularity and bandlike changes. Short interval follow-up may be helpful to distinguish from metastatic process. PET scan could also be considered. Nodular area in the LEFT upper lobe anteriorly and medially site of previous ground-glass on the prior study now measuring 11 x 9 mm. This too may relate to post treatment changes but is suspicious given patient history and appearance. Lesion in the RIGHT kidney with similar appearance to recent imaging and with diminished size compared to prior studies from May of 2022.  No evidence of worsening bony metastatic disease with subtle sclerotic lesions and unchanged appearance of T12 compression fracture. Aortic atherosclerosis. Electronically Signed   By: Zetta Bills M.D.   On: 02/16/2021 16:27   CT Abdomen Pelvis W Contrast  Result Date: 02/16/2021 CLINICAL DATA:  A 60 year old male presents with history of non-small cell lung cancer diagnosed in 2021. Post chemo and radiotherapy, currently on systemic therapy. EXAM: CT CHEST, ABDOMEN, AND PELVIS WITH CONTRAST TECHNIQUE: Multidetector CT imaging of the chest, abdomen and pelvis was performed following the standard protocol during bolus administration of intravenous contrast. CONTRAST:  80m OMNIPAQUE IOHEXOL 350 MG/ML SOLN COMPARISON:  Comparison made with imaging from August and June of 2022 (CT evaluations), also with prior PET scan from May of 2022. FINDINGS: CT CHEST FINDINGS Cardiovascular: RIGHT-sided Port-A-Cath terminates at  the caval to atrial junction as before. Heart size is stable without signs of pericardial effusion of substantial volume, small amount of pericardial fluid is similar to the prior exam. Central pulmonary vasculature is unremarkable. Mediastinum/Nodes: No adenopathy at the thoracic inlet or in the bilateral axillary regions. No mediastinal adenopathy. No hilar adenopathy. Soft tissue extending from the RIGHT hilum peripherally compatible with post treatment changes, similar to previous imaging Lungs/Pleura: Volume loss, bronchiectasis and cystic changes in the RIGHT upper lobe similar to previous imaging. Interval development of nodular area in the LEFT upper lobe (image 95/6) this measures 2.4 x 1.1 cm. Bandlike changes associated with nodularity elsewhere within this area. Nodular area in the LEFT upper lobe anteriorly and medially (image 68/6) site of previous ground-glass on the prior study now measuring 11 x 9 mm. Stable LEFT lower lobe pulmonary nodule (image 107/6) 7 mm. Nodularity extending  into the extrapleural fat of the RIGHT upper chest is similar to previous imaging. Musculoskeletal: See below for full musculoskeletal details. CT ABDOMEN PELVIS FINDINGS Hepatobiliary: No focal, suspicious hepatic lesion. No pericholecystic stranding. No biliary duct dilation. Portal vein is patent. Pancreas: Normal, without mass, inflammation or ductal dilatation. Spleen: Spleen normal size and contour. Adrenals/Urinary Tract: Adrenal glands are normal. Ill-defined area in the RIGHT kidney measuring 2.0 x 1.4 cm previously approximately 2.4 x 1.9 cm. Urinary bladder under distended but without gross abnormality. Stomach/Bowel: No acute gastrointestinal findings. Vascular/Lymphatic: Aortic atherosclerosis. No sign of aneurysm. Smooth contour of the IVC. There is no gastrohepatic or hepatoduodenal ligament lymphadenopathy. No retroperitoneal or mesenteric lymphadenopathy. No pelvic sidewall lymphadenopathy. Reproductive: Unremarkable by CT. Other: No ascites. Musculoskeletal: Subtle sclerosis in LEFT posterior iliac is unchanged. No new suspicious bone lesions though again bony metastases were largely occult on previous imaging. Unchanged appearance of T12 compression fracture IMPRESSION: Interval development of nodular area in the LEFT upper lobe this measures 2.4 x 1.1 cm. This finding is concerning for metastatic disease. Adjacent pleural thickening in this area in the setting of prior radiotherapy makes this area indeterminate with rapid development. Linear border of this on axial images but without bandlike appearance on coronal and sagittal images. Perhaps related to post radiation changes and associated with adjacent nodularity and bandlike changes. Short interval follow-up may be helpful to distinguish from metastatic process. PET scan could also be considered. Nodular area in the LEFT upper lobe anteriorly and medially site of previous ground-glass on the prior study now measuring 11 x 9 mm. This too may  relate to post treatment changes but is suspicious given patient history and appearance. Lesion in the RIGHT kidney with similar appearance to recent imaging and with diminished size compared to prior studies from May of 2022. No evidence of worsening bony metastatic disease with subtle sclerotic lesions and unchanged appearance of T12 compression fracture. Aortic atherosclerosis. Electronically Signed   By: Zetta Bills M.D.   On: 02/16/2021 16:27     ASSESSMENT AND PLAN: This is a very pleasant 60 years old white male with metastatic non-small cell lung cancer that was initially diagnosed as stage IIb (T3, N0, M0) non-small cell lung cancer, adenocarcinoma presented with perihilar right upper lobe lung mass with suspicious groundglass opacity in the right middle lobe.  He had evidence for disease metastasis in March 2022 to the right kidney that was biopsy-proven in April 2022. The patient is not a good surgical candidate for resection according to Dr. Roxan Hockey. The patient underwent a course of concurrent chemoradiation with weekly carboplatin for  AUC of 2 and paclitaxel 45 mg/M2 status post 5 cycles.  He has been tolerating this treatment well except for fatigue, mild odynophagia as well as recurrent pneumonia and septicemia.  The patient also has radiation-induced pneumonitis. He was admitted to the hospital several times with recurrent pneumonia.   The patient had evidence for metastatic disease in April 2022 and he is currently undergoing systemic chemotherapy with carboplatin for AUC of 5, Alimta 500 Mg/M2 and Keytruda 200 Mg IV every 3 weeks status post 8 cycles.  Starting from cycle #5 the patient is on maintenance treatment with Alimta and Keytruda every 3 weeks. The patient has been tolerating this treatment well with no concerning adverse effects except for mild fatigue. I recommended for him to proceed with cycle #9 today as planned. For the pain management I will give him refill of  Percocet. For the constipation is currently on lactulose on as-needed basis. The patient will come back for follow-up visit in 3 weeks for evaluation before starting cycle #10. He was advised to call immediately if he has any other concerning symptoms in the interval. The patient voices understanding of current disease status and treatment options and is in agreement with the current care plan.  All questions were answered. The patient knows to call the clinic with any problems, questions or concerns. We can certainly see the patient much sooner if necessary.  Disclaimer: This note was dictated with voice recognition software. Similar sounding words can inadvertently be transcribed and may not be corrected upon review.

## 2021-03-11 NOTE — Patient Instructions (Signed)
[facility]  Discharge Instructions: Thank you for choosing [facility] to provide your oncology and hematology care.   If you have a lab appointment with the [facility], please go directly to the [facility] and check in at the registration area.   Wear comfortable clothing and clothing appropriate for easy access to any Portacath or PICC line.   We strive to give you quality time with your provider. You may need to reschedule your appointment if you arrive late (15 or more minutes).  Arriving late affects you and other patients whose appointments are after yours.  Also, if you miss three or more appointments without notifying the office, you may be dismissed from the clinic at the provider's discretion.      For prescription refill requests, have your pharmacy contact our office and allow 72 hours for refills to be completed.    Today you received the following chemotherapy and/or immunotherapy agents: Keytruda and Alimta      To help prevent nausea and vomiting after your treatment, we encourage you to take your nausea medication as directed.  BELOW ARE SYMPTOMS THAT SHOULD BE REPORTED IMMEDIATELY: *FEVER GREATER THAN 100.4 F (38 C) OR HIGHER *CHILLS OR SWEATING *NAUSEA AND VOMITING THAT IS NOT CONTROLLED WITH YOUR NAUSEA MEDICATION *UNUSUAL SHORTNESS OF BREATH *UNUSUAL BRUISING OR BLEEDING *URINARY PROBLEMS (pain or burning when urinating, or frequent urination) *BOWEL PROBLEMS (unusual diarrhea, constipation, pain near the anus) TENDERNESS IN MOUTH AND THROAT WITH OR WITHOUT PRESENCE OF ULCERS (sore throat, sores in mouth, or a toothache) UNUSUAL RASH, SWELLING OR PAIN  UNUSUAL VAGINAL DISCHARGE OR ITCHING   Items with * indicate a potential emergency and should be followed up as soon as possible or go to the Emergency Department if any problems should occur.  Please show the CHEMOTHERAPY ALERT CARD or IMMUNOTHERAPY ALERT CARD at  check-in to the Emergency Department and triage nurse.  Should you have questions after your visit or need to cancel or reschedule your appointment, please contact [facility]  Dept: [phone]  and follow the prompts.  Office hours are 8:00 a.m. to 4:30 p.m. Monday - Friday. Please note that voicemails left after 4:00 p.m. may not be returned until the following business day.  We are closed weekends and major holidays. You have access to a nurse at all times for urgent questions. Please call the main number to the clinic Dept: [phone] and follow the prompts.   For any non-urgent questions, you may also contact your provider using MyChart. We now offer e-Visits for anyone 63 and older to request care online for non-urgent symptoms. For details visit [email or web address].   Also download the MyChart app! Go to the app store, search "MyChart", open the app, select [facility], and log in with your MyChart username and password.  Due to Covid, a mask is required upon entering the hospital/clinic. If you do not have a mask, one will be given to you upon arrival. For doctor visits, patients may have 1 support person aged 6 or older with them. For treatment visits, patients cannot have anyone with them due to current Covid guidelines and our immunocompromised population.

## 2021-03-21 ENCOUNTER — Other Ambulatory Visit: Payer: Self-pay | Admitting: Internal Medicine

## 2021-03-21 DIAGNOSIS — C3491 Malignant neoplasm of unspecified part of right bronchus or lung: Secondary | ICD-10-CM

## 2021-03-22 ENCOUNTER — Encounter: Payer: Self-pay | Admitting: Internal Medicine

## 2021-03-28 ENCOUNTER — Other Ambulatory Visit: Payer: Self-pay | Admitting: Internal Medicine

## 2021-03-28 DIAGNOSIS — K5900 Constipation, unspecified: Secondary | ICD-10-CM

## 2021-03-29 ENCOUNTER — Encounter: Payer: Self-pay | Admitting: Internal Medicine

## 2021-03-30 NOTE — Progress Notes (Signed)
[address] OFFICE PROGRESS NOTE  [name], DO [place]  [address] [place] [address]  DIAGNOSIS: Metastatic non-small cell lung cancer initially diagnosed as stage IIB (T3, N0, M0) non-small cell lung cancer, adenocarcinoma presented with large central perihilar mass with suspicious groundglass opacity in the right middle lobe and left upper lobe diagnosed in September 2021.  The patient had evidence of metastatic disease to the right kidney, right pleural space, liver, thoracic:/Abdominal lymph nodes, and bones.  There is also a probable left-sided pleural metastasis as well in March 2022/May 2022.    Molecular studies by guardant 360: No actionable mutations.   PDl1: 80%  PRIOR THERAPY: 1) Concurrent chemoradiation with weekly carboplatin for AUC of 2 and paclitaxel 45 mg/M2.  First dose February 10, 2020.  Status post 5  cycles. 2) Palliative radiation to the left anterior rib cage area as well as the mid back area under the care of Dr. Sondra Come. Last treatment on 10/05/20. 3) SRS to the brain (central right cerebellum) lesion completed on 10/13/20.   CURRENT THERAPY: 1) Palliative systemic chemotherapy with carboplatin AUC of 5, Alimta 500 mg per metered squared, Keytruda 200 mg IV every 3 weeks.  First dose expected on 09/24/2020. Status post 9 cycles.  Starting from cycle #5, the patient has been on maintenance Alimta and Keytruda.  INTERVAL HISTORY: Richard Li 60 y.o. male returns to the clinic today for a follow-up visit. The patient was last seen in the clinic 3 weeks ago.  The patient is feeling fairly well today without any concerning complaints except for lower back pain. The patient was seen in the symptom management clinic in early November 2022 for the chief complaint of back pain.  The patient's x-ray showed a stable compression fracture at T12.  The patient is currently prescribed oxycodone as needed for back pain.  The patient states that  he take tylenol and reserves the oxycodone if needed for pain. He has about 5 tablets left. He notices the pain is worse with certain positions/sitting slumped over on the couch. The patient denies known traumas or injuries. He has noticed that heat helps his pain and has been using a heating pad. No overlying bruising or skin changes.   He tolerates his treatment fair except he reports fatigue and decreased appetite 3-4 days following infusion. He also has smell aversions to food. He sometimes will take his anti-emetic before eating to combat this. He denies vomiting but sometimes has nausea. He has a prescription for zofran and compazine.   The patient denies any fever, chills, or weight loss.  He has baseline night sweats approximately 1x per week. He reports dyspnea on exertion and states his breathing "ain't good" but not any worse than his baseline. He sometimes has a baseline cough that produces clear/yellow phlegm. He has the inhaler for his wheezing. He uses mucinex BID.  He intermittently has left rib pain likely due to his metastatic disease for which he previously received palliative radiation which helped significantly. He notes that laying on his left rib cage exacerbates his pain. Denies recent diarrhea or constipation. He denies headaches or visual changes. For his history of metastatic disease to the brain which his next brain MRI is scheduled for 04/29/2021. The patient is here today for evaluation before starting cycle #10.     MEDICAL HISTORY: Past Medical History:  Diagnosis Date   Allergic rhinitis, cause unspecified    Anxiety state, unspecified  Asthma    as a child   Chronic airway obstruction, not elsewhere classified    Esophageal reflux    History of kidney stones    History of radiation therapy 02/13/2020-03/24/2020   right lung        Dr Gery Pray   Hypertension    Lumbago    lung ca dx'd 12/2019   Other and unspecified hyperlipidemia    Other chest pain      ALLERGIES:  is allergic to penicillins and amoxicillin.  MEDICATIONS:  Current Outpatient Medications  Medication Sig Dispense Refill   acetaminophen (TYLENOL) 500 MG tablet Take 1,000 mg by mouth 2 (two) times daily.     COMBIVENT RESPIMAT 20-100 MCG/ACT AERS respimat INHALE 1 TO 2 PUFFS BY MOUTH EVERY 6 HOURS AS NEEDED FOR WHEEZING 4 g 5   dextromethorphan-guaiFENesin (MUCINEX DM) 30-600 MG 12hr tablet Take 1 tablet by mouth 2 (two) times daily.     doxycycline (VIBRA-TABS) 100 MG tablet Take 1 tablet by mouth twice daily 60 tablet 2   feeding supplement (ENSURE ENLIVE / ENSURE PLUS) LIQD Take 237 mLs by mouth 3 (three) times daily between meals. (Patient taking differently: Take 237 mLs by mouth 2 (two) times daily between meals.) 237 mL 12   fenofibrate 160 MG tablet Take 1 tablet by mouth once daily (Patient taking differently: Take 160 mg by mouth daily.) 90 tablet 3   ferrous sulfate 325 (65 FE) MG tablet Take 1 tablet (325 mg total) by mouth daily with breakfast. 30 tablet 0   folic acid (FOLVITE) 1 MG tablet Take 1 tablet by mouth once daily 30 tablet 0   lactulose (CHRONULAC) 10 GM/15ML solution TAKE 15 MLS BY MOUTH 3 TIMES DAILY 236 mL 0   lidocaine-prilocaine (EMLA) cream Apply 1 application topically as needed. 30 g 2   LORazepam (ATIVAN) 1 MG tablet Take 1 tablet (1 mg total) by mouth every 8 (eight) hours as needed for anxiety. 90 tablet 5   Multiple Vitamin (MULTIVITAMIN WITH MINERALS) TABS tablet Take 1 tablet by mouth daily.     ondansetron (ZOFRAN) 8 MG tablet Take 1 tablet (8 mg total) by mouth every 8 (eight) hours as needed for nausea or vomiting. Starting day 3 after chemotherapy 30 tablet 2   potassium chloride SA (KLOR-CON) 20 MEQ tablet Take 1 tablet (20 mEq total) by mouth daily. 5 tablet 0   predniSONE (DELTASONE) 10 MG tablet Take 1 tablet (10 mg total) by mouth daily with breakfast. 90 tablet 0   prochlorperazine (COMPAZINE) 10 MG tablet Take 1 tablet (10 mg  total) by mouth every 6 (six) hours as needed. 30 tablet 2   meclizine (ANTIVERT) 25 MG tablet Take 1 tablet (25 mg total) by mouth every 6 (six) hours as needed for dizziness. (Patient not taking: Reported on 03/11/2021) 60 tablet 0   oxyCODONE-acetaminophen (PERCOCET/ROXICET) 5-325 MG tablet Take 1 tablet by mouth every 6 (six) hours as needed for severe pain. 30 tablet 0   No current facility-administered medications for this visit.   Facility-Administered Medications Ordered in Other Visits  Medication Dose Route Frequency Provider Last Rate Last Admin   0.9 %  sodium chloride infusion   Intravenous Continuous Curt Bears, MD   Stopped at 03/05/20 1410    SURGICAL HISTORY:  Past Surgical History:  Procedure Laterality Date   APPENDECTOMY     BRONCHIAL BIOPSY  01/07/2020   Procedure: BRONCHIAL BIOPSIES;  Surgeon: Collene Gobble, MD;  Location: [facility]  ENDOSCOPY;  Service: Pulmonary;;   BRONCHIAL BIOPSY  04/05/2020   Procedure: BRONCHIAL BIOPSIES;  Surgeon: Rigoberto Noel, MD;  Location: [facility];  Service: Cardiopulmonary;;   BRONCHIAL BRUSHINGS  01/07/2020   Procedure: BRONCHIAL BRUSHINGS;  Surgeon: Collene Gobble, MD;  Location: [facility] ENDOSCOPY;  Service: Pulmonary;;   BRONCHIAL NEEDLE ASPIRATION BIOPSY  01/07/2020   Procedure: BRONCHIAL NEEDLE ASPIRATION BIOPSIES;  Surgeon: Collene Gobble, MD;  Location: [facility] ENDOSCOPY;  Service: Pulmonary;;   BRONCHIAL WASHINGS  01/07/2020   Procedure: BRONCHIAL WASHINGS;  Surgeon: Collene Gobble, MD;  Location: [facility] ENDOSCOPY;  Service: Pulmonary;;   BRONCHIAL WASHINGS  04/05/2020   Procedure: BRONCHIAL WASHINGS;  Surgeon: Rigoberto Noel, MD;  Location: [facility];  Service: Cardiopulmonary;;   COLONOSCOPY  15 years ago   HEMOSTASIS CONTROL  01/07/2020   Procedure: HEMOSTASIS CONTROL;  Surgeon: Collene Gobble, MD;  Location: [facility] ENDOSCOPY;  Service: Pulmonary;;  cold saline   IR IMAGING GUIDED PORT INSERTION  09/18/2020   NASAL TURBINATE REDUCTION   2002   Dr.Crossley   VASECTOMY     VIDEO BRONCHOSCOPY Right 04/05/2020   Procedure: VIDEO BRONCHOSCOPY WITH FLUORO;  Surgeon: Rigoberto Noel, MD;  Location: [facility];  Service: Cardiopulmonary;  Laterality: Right;   VIDEO BRONCHOSCOPY WITH ENDOBRONCHIAL NAVIGATION N/A 01/07/2020   Procedure: VIDEO BRONCHOSCOPY WITH ENDOBRONCHIAL NAVIGATION;  Surgeon: Collene Gobble, MD;  Location: [facility] ENDOSCOPY;  Service: Pulmonary;  Laterality: N/A;   VIDEO BRONCHOSCOPY WITH ENDOBRONCHIAL ULTRASOUND N/A 01/07/2020   Procedure: VIDEO BRONCHOSCOPY WITH ENDOBRONCHIAL ULTRASOUND;  Surgeon: Collene Gobble, MD;  Location: [facility] ENDOSCOPY;  Service: Pulmonary;  Laterality: N/A;    REVIEW OF SYSTEMS:   Review of Systems  Constitutional: Positive for fatigue 3 to 4 days following treatment.  Negative for appetite change, chills, fever and unexpected weight change.  HENT: Negative for mouth sores, nosebleeds, sore throat and trouble swallowing.   Eyes: Negative for eye problems and icterus.  Respiratory: Positive for shortness of breath and baseline cough.  Negative for  hemoptysis and wheezing.   Cardiovascular: Positive for occasional left-sided rib pain.  Negative for chest pain and leg swelling.  Gastrointestinal: Positive for occasional nausea.  Negative for abdominal pain, constipation, diarrhea, nausea and vomiting.  Genitourinary: Negative for bladder incontinence, difficulty urinating, dysuria, frequency and hematuria.   Musculoskeletal: Positive for low back pain.  Negative for gait problem, neck pain and neck stiffness.  Skin: Negative for itching and rash.  Neurological: Negative for dizziness, extremity weakness, gait problem, headaches, light-headedness and seizures.  Hematological: Negative for adenopathy. Does not bruise/bleed easily.  Psychiatric/Behavioral: Negative for confusion, depression and sleep disturbance. The patient is not nervous/anxious.     PHYSICAL EXAMINATION:  Blood pressure  121/75, pulse (!) 105, temperature 97.9 F (36.6 C), temperature source Temporal, resp. rate 17, height _0  (1.778 m), weight 199 lb 4.8 oz (90.4 kg), SpO2 97 %.  ECOG PERFORMANCE STATUS: 1  Physical Exam  Constitutional: Oriented to person, place, and time and well-developed, well-nourished, and in no distress.  HENT:  Head: Normocephalic and atraumatic.  Mouth/Throat: Oropharynx is clear and moist. No oropharyngeal exudate.  Eyes: Conjunctivae are normal. Right eye exhibits no discharge. Left eye exhibits no discharge. No scleral icterus.  Neck: Normal range of motion. Neck supple.  Cardiovascular: Normal rate, regular rhythm, normal heart sounds and intact distal pulses.   Pulmonary/Chest: Effort normal and breath sounds normal. No respiratory distress. No wheezes. No rales.  Abdominal: Soft. Bowel sounds are  normal. Exhibits no distension and no mass. There is no tenderness.  Musculoskeletal: Normal range of motion. Exhibits no edema.  Positive for tenderness to palpation over the paravertebral musculature.  Lymphadenopathy:    No cervical adenopathy.  Neurological: Alert and oriented to person, place, and time. Exhibits normal muscle tone. Gait normal. Coordination normal.  Skin: Positive for skin discoloration/dry skin over prior radiation site on mid thoracic spine.  Skin is warm and dry. No rash noted. Not diaphoretic. No erythema. No pallor.  Psychiatric: Mood, memory and judgment normal.  Vitals reviewed.  LABORATORY DATA: Lab Results  Component Value Date   WBC 6.6 04/01/2021   HGB 10.6 (L) 04/01/2021   HCT 30.7 (L) 04/01/2021   MCV 96.2 04/01/2021   PLT 276 04/01/2021      Chemistry      Component Value Date/Time   NA 140 03/11/2021 0926   K 3.2 (L) 03/11/2021 0926   CL 106 03/11/2021 0926   CO2 25 03/11/2021 0926   BUN 11 03/11/2021 0926   CREATININE 0.89 03/11/2021 0926   CREATININE 0.67 (L) 06/24/2020 1517      Component Value Date/Time   CALCIUM 9.1  03/11/2021 0926   ALKPHOS 92 03/11/2021 0926   AST 26 03/11/2021 0926   ALT 12 03/11/2021 0926   BILITOT 0.4 03/11/2021 0926       RADIOGRAPHIC STUDIES:  No results found.   ASSESSMENT/PLAN:  This is a very pleasant 60 year old Caucasian male with metastatic non-small cell lung cancer.  He was initially diagnosed as a stage IIb (T3, N0, M0) non-small cell lung cancer, adenocarcinoma.  He originally presented with a perihilar right upper lobe lung mass and suspicious groundglass opacity in the right middle lobe.  He had evidence of metastatic disease in March/May 2022 with a biopsy-proven metastatic lesion in the right kidney as well as right pleural space, liver, thoracic/abdominal lymph nodes, and bones.  There is also a probable left-sided pleural metastasis.  He also had a small metastatic brain lesion.  The patient's PD-L1 expression is 80%.  He is negative for any actionable mutations by guardant 360.   The patient was not a good candidate for resection according to Dr. Roxan Hockey.  Therefore, the patient underwent a course of concurrent chemoradiation with weekly carboplatin for an AUC of 2 and paclitaxel 45 mg per metered squared status post 5 cycles.  He experienced some fatigue, mild odynophagia as well as recurrent pneumonia and septicemia.  He also had radiation-induced pneumonitis.  He was admitted to the hospital several times with recurrent pneumonia.    In March 2022, the patient had a biopsy confirmed metastatic lesion to the kidney.    The patient is currently undergoing palliative systemic chemotherapy with carboplatin for an AUC of 5, Alimta 500 mg per metered squared, Keytruda 200 mg IV every 3 weeks.  He is status post 9 cycles and tolerated it fair except for fatigue 3 to 4 days following treatment as well as nausea without vomiting and decreased appetite. Starting from cycle #5, the patient has been on maintenance alimta and [place]   The patient completed palliative  radiation to the painful ribs and mid thoracic metastatic bone lesions under the care of Dr. Sondra Come.  This was completed on 10/05/2020.  The patient also underwent SRS to the metastatic brain lesion under the care of Dr. Isidore Moos which was completed on 10/13/2020.  His next follow-up brain MRI scheduled for 04/29/2021.  Labs were reviewed. Recommend he proceed with cycle  number 10 today scheduled.   We will see him back for a follow up visit in 3 weeks for evaluation before starting cycle #11.   I will arrange for a restaging CT scan of the chest, abdomen, and pelvis prior to starting his next cycle of treatment.  Reviewed reassess the patient's compression fracture at this time.  Additionally, the patient's last restaging CT scan from 02/11/2021 which showed indeterminate area in the left upper lobe that could be related to disease progression versus postradiation changes.  We will follow-up on this on his upcoming CT scan  I will refill his oxycodone for his compression fracture as we are approaching the weekend and he only has a few tablets left.  The patient knows only to reserve oxycodone if needed for significant pain.  He generally tries to take Tylenol for his pain.  The patient had some tenderness to palpation over the musculature in the low back.  Advised the patient to try Salonpas patches or heating pads.   Encouraged the patient to alternate his antiemetics if needed for better control of his nausea.  The patient was also advised to take his antiemetic prior to eating to see if it helps with the aversions to smells/nausea.   The patient was advised to call immediately if he has any concerning symptoms in the interval. The patient voices understanding of current disease status and treatment options and is in agreement with the current care plan. All questions were answered. The patient knows to call the clinic with any problems, questions or concerns. We can certainly see the patient much  sooner if necessary     Orders Placed This Encounter  Procedures   CT Chest W Contrast    Standing Status:   Future    Standing Expiration Date:   04/01/2022    Order Specific Question:   If indicated for the ordered procedure, I authorize the administration of contrast media per Radiology protocol    Answer:   Yes    Order Specific Question:   Preferred imaging location?    Answer:   [facility]   CT Abdomen Pelvis W Contrast    Standing Status:   Future    Standing Expiration Date:   04/01/2022    Order Specific Question:   If indicated for the ordered procedure, I authorize the administration of contrast media per Radiology protocol    Answer:   Yes    Order Specific Question:   Preferred imaging location?    Answer:   [facility]    Order Specific Question:   Is Oral Contrast requested for this exam?    Answer:   Yes, Per Radiology protocol     The total time spent in the appointment was 20-29 minutes.   Ranyia Witting L Anderson Coppock, PA-C 04/01/21

## 2021-04-01 ENCOUNTER — Other Ambulatory Visit (HOSPITAL_COMMUNITY): Payer: Self-pay

## 2021-04-01 ENCOUNTER — Inpatient Hospital Stay: Payer: BC Managed Care – PPO | Attending: Internal Medicine

## 2021-04-01 ENCOUNTER — Inpatient Hospital Stay: Payer: BC Managed Care – PPO

## 2021-04-01 ENCOUNTER — Other Ambulatory Visit: Payer: Self-pay

## 2021-04-01 ENCOUNTER — Inpatient Hospital Stay (HOSPITAL_BASED_OUTPATIENT_CLINIC_OR_DEPARTMENT_OTHER): Payer: BC Managed Care – PPO | Admitting: Physician Assistant

## 2021-04-01 VITALS — BP 121/75 | HR 105 | Temp 97.9°F | Resp 17 | Ht 70.0 in | Wt 199.3 lb

## 2021-04-01 VITALS — HR 92

## 2021-04-01 DIAGNOSIS — Z5112 Encounter for antineoplastic immunotherapy: Secondary | ICD-10-CM | POA: Insufficient documentation

## 2021-04-01 DIAGNOSIS — Z5111 Encounter for antineoplastic chemotherapy: Secondary | ICD-10-CM | POA: Insufficient documentation

## 2021-04-01 DIAGNOSIS — R110 Nausea: Secondary | ICD-10-CM | POA: Insufficient documentation

## 2021-04-01 DIAGNOSIS — C771 Secondary and unspecified malignant neoplasm of intrathoracic lymph nodes: Secondary | ICD-10-CM | POA: Diagnosis not present

## 2021-04-01 DIAGNOSIS — C7951 Secondary malignant neoplasm of bone: Secondary | ICD-10-CM | POA: Insufficient documentation

## 2021-04-01 DIAGNOSIS — R634 Abnormal weight loss: Secondary | ICD-10-CM | POA: Insufficient documentation

## 2021-04-01 DIAGNOSIS — J90 Pleural effusion, not elsewhere classified: Secondary | ICD-10-CM | POA: Insufficient documentation

## 2021-04-01 DIAGNOSIS — C7901 Secondary malignant neoplasm of right kidney and renal pelvis: Secondary | ICD-10-CM | POA: Diagnosis not present

## 2021-04-01 DIAGNOSIS — C3481 Malignant neoplasm of overlapping sites of right bronchus and lung: Secondary | ICD-10-CM | POA: Diagnosis not present

## 2021-04-01 DIAGNOSIS — D649 Anemia, unspecified: Secondary | ICD-10-CM

## 2021-04-01 DIAGNOSIS — C3490 Malignant neoplasm of unspecified part of unspecified bronchus or lung: Secondary | ICD-10-CM | POA: Diagnosis not present

## 2021-04-01 DIAGNOSIS — C782 Secondary malignant neoplasm of pleura: Secondary | ICD-10-CM | POA: Insufficient documentation

## 2021-04-01 DIAGNOSIS — C3491 Malignant neoplasm of unspecified part of right bronchus or lung: Secondary | ICD-10-CM

## 2021-04-01 DIAGNOSIS — Z95828 Presence of other vascular implants and grafts: Secondary | ICD-10-CM

## 2021-04-01 DIAGNOSIS — Z79899 Other long term (current) drug therapy: Secondary | ICD-10-CM | POA: Insufficient documentation

## 2021-04-01 DIAGNOSIS — C787 Secondary malignant neoplasm of liver and intrahepatic bile duct: Secondary | ICD-10-CM | POA: Diagnosis not present

## 2021-04-01 DIAGNOSIS — C7931 Secondary malignant neoplasm of brain: Secondary | ICD-10-CM | POA: Diagnosis not present

## 2021-04-01 LAB — CMP (CANCER CENTER ONLY)
ALT: 12 U/L (ref 0–44)
AST: 22 U/L (ref 15–41)
Albumin: 3.1 g/dL — ABNORMAL LOW (ref 3.5–5.0)
Alkaline Phosphatase: 93 U/L (ref 38–126)
Anion gap: 8 (ref 5–15)
BUN: 13 mg/dL (ref 6–20)
CO2: 23 mmol/L (ref 22–32)
Calcium: 8.9 mg/dL (ref 8.9–10.3)
Chloride: 107 mmol/L (ref 98–111)
Creatinine: 0.99 mg/dL (ref 0.61–1.24)
GFR, Estimated: >60 mL/min (ref >60)
Glucose, Bld: 107 mg/dL — ABNORMAL HIGH (ref 70–99)
Potassium: 3.4 mmol/L — ABNORMAL LOW (ref 3.5–5.1)
Sodium: 138 mmol/L (ref 135–145)
Total Bilirubin: 0.5 mg/dL (ref 0.3–1.2)
Total Protein: 6.7 g/dL (ref 6.5–8.1)

## 2021-04-01 LAB — TSH: TSH: 1.074 u[IU]/mL (ref 0.320–4.118)

## 2021-04-01 LAB — CBC WITH DIFFERENTIAL (CANCER CENTER ONLY)
Abs Immature Granulocytes: 0.04 10*3/uL (ref 0.00–0.07)
Basophils Absolute: 0 10*3/uL (ref 0.0–0.1)
Basophils Relative: 1 %
Eosinophils Absolute: 0.1 10*3/uL (ref 0.0–0.5)
Eosinophils Relative: 1 %
HCT: 30.7 % — ABNORMAL LOW (ref 39.0–52.0)
Hemoglobin: 10.6 g/dL — ABNORMAL LOW (ref 13.0–17.0)
Immature Granulocytes: 1 %
Lymphocytes Relative: 7 %
Lymphs Abs: 0.5 10*3/uL — ABNORMAL LOW (ref 0.7–4.0)
MCH: 33.2 pg (ref 26.0–34.0)
MCHC: 34.5 g/dL (ref 30.0–36.0)
MCV: 96.2 fL (ref 80.0–100.0)
Monocytes Absolute: 0.7 10*3/uL (ref 0.1–1.0)
Monocytes Relative: 11 %
Neutro Abs: 5.3 10*3/uL (ref 1.7–7.7)
Neutrophils Relative %: 79 %
Platelet Count: 276 10*3/uL (ref 150–400)
RBC: 3.19 MIL/uL — ABNORMAL LOW (ref 4.22–5.81)
RDW: 15.5 % (ref 11.5–15.5)
WBC Count: 6.6 10*3/uL (ref 4.0–10.5)
nRBC: 0 % (ref 0.0–0.2)

## 2021-04-01 MED ORDER — SODIUM CHLORIDE 0.9% FLUSH
10.0000 mL | INTRAVENOUS | Status: DC | PRN
  Administered 2021-04-01: 10 mL

## 2021-04-01 MED ORDER — SODIUM CHLORIDE 0.9% FLUSH
10.0000 mL | INTRAVENOUS | Status: AC | PRN
  Administered 2021-04-01: 10 mL

## 2021-04-01 MED ORDER — PROCHLORPERAZINE MALEATE 10 MG PO TABS
10.0000 mg | ORAL_TABLET | Freq: Once | ORAL | Status: AC
  Administered 2021-04-01: 10 mg via ORAL
  Filled 2021-04-01: qty 1

## 2021-04-01 MED ORDER — HEPARIN SOD (PORK) LOCK FLUSH 100 UNIT/ML IV SOLN
500.0000 [IU] | Freq: Once | INTRAVENOUS | Status: AC | PRN
  Administered 2021-04-01: 500 [IU]

## 2021-04-01 MED ORDER — SODIUM CHLORIDE 0.9 % IV SOLN
Freq: Once | INTRAVENOUS | Status: AC
Start: 2021-04-01 — End: 2021-04-01

## 2021-04-01 MED ORDER — SODIUM CHLORIDE 0.9 % IV SOLN
500.0000 mg/m2 | Freq: Once | INTRAVENOUS | Status: AC
  Administered 2021-04-01: 1000 mg via INTRAVENOUS
  Filled 2021-04-01: qty 40

## 2021-04-01 MED ORDER — OXYCODONE-ACETAMINOPHEN 5-325 MG PO TABS
1.0000 | ORAL_TABLET | Freq: Four times a day (QID) | ORAL | 0 refills | Status: DC | PRN
  Filled 2021-04-01: qty 30, 8d supply, fill #0

## 2021-04-01 MED ORDER — SODIUM CHLORIDE 0.9 % IV SOLN
200.0000 mg | Freq: Once | INTRAVENOUS | Status: AC
  Administered 2021-04-01: 200 mg via INTRAVENOUS
  Filled 2021-04-01: qty 8

## 2021-04-01 MED ORDER — CYANOCOBALAMIN 1000 MCG/ML IJ SOLN: 1000.0000 ug | Freq: Once | INTRAMUSCULAR | Status: DC

## 2021-04-01 NOTE — Patient Instructions (Signed)
[facility] ONCOLOGY  Discharge Instructions: Thank you for choosing [facility] to provide your oncology and hematology care.   If you have a lab appointment with the [facility], please go directly to the [facility] and check in at the registration area.   Wear comfortable clothing and clothing appropriate for easy access to any Portacath or PICC line.   We strive to give you quality time with your provider. You may need to reschedule your appointment if you arrive late (15 or more minutes).  Arriving late affects you and other patients whose appointments are after yours.  Also, if you miss three or more appointments without notifying the office, you may be dismissed from the clinic at the provider's discretion.      For prescription refill requests, have your pharmacy contact our office and allow 72 hours for refills to be completed.    Today you received the following chemotherapy and/or immunotherapy agents: Keytruda and Alimta      To help prevent nausea and vomiting after your treatment, we encourage you to take your nausea medication as directed.  BELOW ARE SYMPTOMS THAT SHOULD BE REPORTED IMMEDIATELY: *FEVER GREATER THAN 100.4 F (38 C) OR HIGHER *CHILLS OR SWEATING *NAUSEA AND VOMITING THAT IS NOT CONTROLLED WITH YOUR NAUSEA MEDICATION *UNUSUAL SHORTNESS OF BREATH *UNUSUAL BRUISING OR BLEEDING *URINARY PROBLEMS (pain or burning when urinating, or frequent urination) *BOWEL PROBLEMS (unusual diarrhea, constipation, pain near the anus) TENDERNESS IN MOUTH AND THROAT WITH OR WITHOUT PRESENCE OF ULCERS (sore throat, sores in mouth, or a toothache) UNUSUAL RASH, SWELLING OR PAIN  UNUSUAL VAGINAL DISCHARGE OR ITCHING   Items with * indicate a potential emergency and should be followed up as soon as possible or go to the Emergency Department if any problems should occur.  Please show the CHEMOTHERAPY ALERT CARD or IMMUNOTHERAPY ALERT CARD at  check-in to the Emergency Department and triage nurse.  Should you have questions after your visit or need to cancel or reschedule your appointment, please contact [facility]  Dept: [phone]  and follow the prompts.  Office hours are 8:00 a.m. to 4:30 p.m. Monday - Friday. Please note that voicemails left after 4:00 p.m. may not be returned until the following business day.  We are closed weekends and major holidays. You have access to a nurse at all times for urgent questions. Please call the main number to the clinic Dept: [phone] and follow the prompts.   For any non-urgent questions, you may also contact your provider using MyChart. We now offer e-Visits for anyone 18 and older to request care online for non-urgent symptoms. For details visit [email or web address].   Also download the MyChart app! Go to the app store, search "MyChart", open the app, select [facility], and log in with your MyChart username and password.  Due to Covid, a mask is required upon entering the hospital/clinic. If you do not have a mask, one will be given to you upon arrival. For doctor visits, patients may have 1 support person aged 83 or older with them. For treatment visits, patients cannot have anyone with them due to current Covid guidelines and our immunocompromised population.

## 2021-04-06 ENCOUNTER — Other Ambulatory Visit: Payer: Self-pay | Admitting: Pulmonary Disease

## 2021-04-11 ENCOUNTER — Other Ambulatory Visit: Payer: Self-pay

## 2021-04-13 ENCOUNTER — Encounter: Payer: Self-pay | Admitting: Internal Medicine

## 2021-04-15 NOTE — Progress Notes (Signed)
[facility] OFFICE PROGRESS NOTE  [name], DO [place]  [address] [place] [address]  DIAGNOSIS: Metastatic non-small cell lung cancer initially diagnosed as stage IIB (T3, N0, M0) non-small cell lung cancer, adenocarcinoma presented with large central perihilar mass with suspicious groundglass opacity in the right middle lobe and left upper lobe diagnosed in September 2021.  The patient had evidence of metastatic disease to the right kidney, right pleural space, liver, thoracic:/Abdominal lymph nodes, and bones.  There is also a probable left-sided pleural metastasis as well in March 2022/May 2022.    Molecular studies by guardant 360: No actionable mutations.   PDl1: 80%  PRIOR THERAPY: 1) Concurrent chemoradiation with weekly carboplatin for AUC of 2 and paclitaxel 45 mg/M2.  First dose February 10, 2020.  Status post 5  cycles. 2) Palliative radiation to the left anterior rib cage area as well as the mid back area under the care of Dr. Sondra Come. Last treatment on 10/05/20. 3) SRS to the brain (central right cerebellum) lesion completed on 10/13/20.   CURRENT THERAPY:  Palliative systemic chemotherapy with carboplatin AUC of 5, Alimta 500 mg per metered squared, Keytruda 200 mg IV every 3 weeks.  First dose expected on 09/24/2020. Status post 10 cycles.  Starting from cycle #5, the patient has been on maintenance Alimta and Keytruda.  INTERVAL HISTORY: Richard Li 60 y.o. male returns to the clinic today for follow-up visit accompanied by his wife.  The patient was last seen in the clinic 3 weeks ago.  The patient is feeling fairly well today except for fatigue.  He has some baseline dyspnea on exertion and a chronic cough.  He thinks his cough feels "deeper" in the interval since his last appointment.  He produces clear phlegm.  He also reports some left-sided sharp rib pain which she has had since having metastatic disease to the left rib.  He completed  palliative radiation to this area.  He is prescribed oxycodone for pain control.  He denies any fevers.  He reports that he feels cold at baseline.  He lost a few pounds since his last appointment.  He drinks supplemental drinks "a couple" times a week.  He denies any hemoptysis.  He has mild nausea and vomiting but reports that is controlled with his antiemetic.  Denies any diarrhea or constipation.  Denies any headache or visual changes.  The patient is scheduled for a repeat brain MRI next week on 04/29/2021.  The patient recently had a restaging CT scan performed.  He is here today for evaluation to review his scan results before starting cycle #11.  MEDICAL HISTORY: Past Medical History:  Diagnosis Date   Allergic rhinitis, cause unspecified    Anxiety state, unspecified    Asthma    as a child   Chronic airway obstruction, not elsewhere classified    Esophageal reflux    History of kidney stones    History of radiation therapy 02/13/2020-03/24/2020   right lung        Dr Gery Pray   Hypertension    Lumbago    lung ca dx'd 12/2019   Other and unspecified hyperlipidemia    Other chest pain     ALLERGIES:  is allergic to penicillins and amoxicillin.  MEDICATIONS:  Current Outpatient Medications  Medication Sig Dispense Refill   acetaminophen (TYLENOL) 500 MG tablet Take 1,000 mg by mouth 2 (two) times daily.     COMBIVENT RESPIMAT 20-100 MCG/ACT  AERS respimat INHALE 1 TO 2 PUFFS BY MOUTH EVERY 6 HOURS AS NEEDED FOR WHEEZING 4 g 5   dextromethorphan-guaiFENesin (MUCINEX DM) 30-600 MG 12hr tablet Take 1 tablet by mouth 2 (two) times daily.     doxycycline (VIBRA-TABS) 100 MG tablet Take 1 tablet by mouth twice daily 60 tablet 2   feeding supplement (ENSURE ENLIVE / ENSURE PLUS) LIQD Take 237 mLs by mouth 3 (three) times daily between meals. (Patient taking differently: Take 237 mLs by mouth daily as needed.) 237 mL 12   fenofibrate 160 MG tablet Take 1 tablet by mouth once daily  (Patient taking differently: Take 160 mg by mouth daily.) 90 tablet 3   ferrous sulfate 325 (65 FE) MG tablet Take 1 tablet (325 mg total) by mouth daily with breakfast. 30 tablet 0   folic acid (FOLVITE) 1 MG tablet Take 1 tablet by mouth once daily 30 tablet 0   lactulose (CHRONULAC) 10 GM/15ML solution TAKE 15 MLS BY MOUTH 3 TIMES DAILY 236 mL 0   lidocaine-prilocaine (EMLA) cream Apply 1 application topically as needed. 30 g 2   LORazepam (ATIVAN) 1 MG tablet Take 1 tablet (1 mg total) by mouth every 8 (eight) hours as needed for anxiety. 90 tablet 5   Multiple Vitamin (MULTIVITAMIN WITH MINERALS) TABS tablet Take 1 tablet by mouth daily.     ondansetron (ZOFRAN) 8 MG tablet Take 1 tablet (8 mg total) by mouth every 8 (eight) hours as needed for nausea or vomiting. Starting day 3 after chemotherapy 30 tablet 2   oxyCODONE-acetaminophen (PERCOCET/ROXICET) 5-325 MG tablet Take 1 tablet by mouth every 6 (six) hours as needed for severe pain. 30 tablet 0   potassium chloride SA (KLOR-CON) 20 MEQ tablet Take 1 tablet (20 mEq total) by mouth daily. 5 tablet 0   predniSONE (DELTASONE) 10 MG tablet Take 1 tablet by mouth once daily with breakfast 90 tablet 0   prochlorperazine (COMPAZINE) 10 MG tablet Take 1 tablet (10 mg total) by mouth every 6 (six) hours as needed. 30 tablet 2   meclizine (ANTIVERT) 25 MG tablet Take 1 tablet (25 mg total) by mouth every 6 (six) hours as needed for dizziness. (Patient not taking: Reported on 03/11/2021) 60 tablet 0   No current facility-administered medications for this visit.   Facility-Administered Medications Ordered in Other Visits  Medication Dose Route Frequency Provider Last Rate Last Admin   0.9 %  sodium chloride infusion   Intravenous Continuous Curt Bears, MD   Stopped at 03/05/20 1410    SURGICAL HISTORY:  Past Surgical History:  Procedure Laterality Date   APPENDECTOMY     BRONCHIAL BIOPSY  01/07/2020   Procedure: BRONCHIAL BIOPSIES;   Surgeon: Collene Gobble, MD;  Location: [facility];  Service: Pulmonary;;   BRONCHIAL BIOPSY  04/05/2020   Procedure: BRONCHIAL BIOPSIES;  Surgeon: Rigoberto Noel, MD;  Location: [facility];  Service: Cardiopulmonary;;   BRONCHIAL BRUSHINGS  01/07/2020   Procedure: BRONCHIAL BRUSHINGS;  Surgeon: Collene Gobble, MD;  Location: [facility] ENDOSCOPY;  Service: Pulmonary;;   BRONCHIAL NEEDLE ASPIRATION BIOPSY  01/07/2020   Procedure: BRONCHIAL NEEDLE ASPIRATION BIOPSIES;  Surgeon: Collene Gobble, MD;  Location: [facility];  Service: Pulmonary;;   BRONCHIAL WASHINGS  01/07/2020   Procedure: BRONCHIAL WASHINGS;  Surgeon: Collene Gobble, MD;  Location[facility] ENDOSCOPY;  Service: Pulmonary;;   BRONCHIAL WASHINGS  04/05/2020   Procedure: BRONCHIAL WASHINGS;  Surgeon: Rigoberto Noel, MD;  Location: [facility];  Service:  Cardiopulmonary;;   COLONOSCOPY  15 years ago   HEMOSTASIS CONTROL  01/07/2020   Procedure: HEMOSTASIS CONTROL;  Surgeon: Collene Gobble, MD;  Location: [facility] ENDOSCOPY;  Service: Pulmonary;;  cold saline   IR IMAGING GUIDED PORT INSERTION  09/18/2020   NASAL TURBINATE REDUCTION  2002   Dr.Crossley   VASECTOMY     VIDEO BRONCHOSCOPY Right 04/05/2020   Procedure: VIDEO BRONCHOSCOPY WITH FLUORO;  Surgeon: Rigoberto Noel, MD;  Location: [facility];  Service: Cardiopulmonary;  Laterality: Right;   VIDEO BRONCHOSCOPY WITH ENDOBRONCHIAL NAVIGATION N/A 01/07/2020   Procedure: VIDEO BRONCHOSCOPY WITH ENDOBRONCHIAL NAVIGATION;  Surgeon: Collene Gobble, MD;  Location: [facility] ENDOSCOPY;  Service: Pulmonary;  Laterality: N/A;   VIDEO BRONCHOSCOPY WITH ENDOBRONCHIAL ULTRASOUND N/A 01/07/2020   Procedure: VIDEO BRONCHOSCOPY WITH ENDOBRONCHIAL ULTRASOUND;  Surgeon: Collene Gobble, MD;  Location: [facility] ENDOSCOPY;  Service: Pulmonary;  Laterality: N/A;    REVIEW OF SYSTEMS:   Review of Systems  Constitutional: Fatigue following treatment.  Positive for weight loss and decreased appetite.  Negative for chills  and fever. HENT: Negative for mouth sores, nosebleeds, sore throat and trouble swallowing.   Eyes: Negative for eye problems and icterus.  Respiratory: Positive for shortness of breath and cough.  Negative for hemoptysis and wheezing.   Cardiovascular: Positive for left-sided rib pain.  Negative for chest pain and leg swelling.  Gastrointestinal: Positive for occasional nausea.  Negative for abdominal pain, constipation, diarrhea, nausea and vomiting.  Genitourinary: Negative for bladder incontinence, difficulty urinating, dysuria, frequency and hematuria.   Musculoskeletal: Positive for low back pain.  Negative for gait problem, neck pain and neck stiffness.  Skin: Negative for itching and rash.  Neurological: Negative for dizziness, extremity weakness, gait problem, headaches, light-headedness and seizures.  Hematological: Negative for adenopathy. Does not bruise/bleed easily.  Psychiatric/Behavioral: Negative for confusion, depression and sleep disturbance. The patient is not nervous/anxious.       PHYSICAL EXAMINATION:  Blood pressure (!) 144/75, pulse (!) 102, temperature 97.8 F (36.6 C), temperature source Tympanic, resp. rate 19, height 5' 10" (1.778 m), weight 196 lb 8 oz (89.1 kg), SpO2 100 %.  ECOG PERFORMANCE STATUS: 1  Physical Exam  Constitutional: Oriented to person, place, and time and well-developed, well-nourished, and in no distress.  HENT:  Head: Normocephalic and atraumatic.  Mouth/Throat: Oropharynx is clear and moist. No oropharyngeal exudate.  Eyes: Conjunctivae are normal. Right eye exhibits no discharge. Left eye exhibits no discharge. No scleral icterus.  Neck: Normal range of motion. Neck supple.  Cardiovascular: Normal rate, regular rhythm, normal heart sounds and intact distal pulses.   Pulmonary/Chest: Effort normal and breath sounds normal. No respiratory distress. No wheezes. No rales.  Abdominal: Soft. Bowel sounds are normal. Exhibits no distension and  no mass. There is no tenderness.  Musculoskeletal: Normal range of motion. Exhibits no edema.  Positive for tenderness to palpation over the paravertebral musculature.  Lymphadenopathy:    No cervical adenopathy.  Neurological: Alert and oriented to person, place, and time. Exhibits normal muscle tone. Gait normal. Coordination normal.  Skin:  Skin is warm and dry. No rash noted. Not diaphoretic. No erythema. No pallor.  Psychiatric: Mood, memory and judgment normal.  Vitals reviewed.  LABORATORY DATA: Lab Results  Component Value Date   WBC 6.4 04/22/2021   HGB 10.4 (L) 04/22/2021   HCT 30.2 (L) 04/22/2021   MCV 96.5 04/22/2021   PLT 295 04/22/2021      Chemistry      Component  Value Date/Time   NA 139 04/22/2021 1019   K 3.3 (L) 04/22/2021 1019   CL 106 04/22/2021 1019   CO2 27 04/22/2021 1019   BUN 10 04/22/2021 1019   CREATININE 0.93 04/22/2021 1019   CREATININE 0.67 (L) 06/24/2020 1517      Component Value Date/Time   CALCIUM 9.0 04/22/2021 1019   ALKPHOS 79 04/22/2021 1019   AST 21 04/22/2021 1019   ALT 10 04/22/2021 1019   BILITOT 0.5 04/22/2021 1019       RADIOGRAPHIC STUDIES:  CT Chest W Contrast  Result Date: 04/21/2021 CLINICAL DATA:  Non-small cell lung cancer restaging. Prior chemotherapy and radiation therapy. T12 compression fracture with associated pain. EXAM: CT CHEST, ABDOMEN, AND PELVIS WITH CONTRAST TECHNIQUE: Multidetector CT imaging of the chest, abdomen and pelvis was performed following the standard protocol during bolus administration of intravenous contrast. CONTRAST:  48m OMNIPAQUE IOHEXOL 350 MG/ML SOLN COMPARISON:  Multiple exams, including 02/15/2021 FINDINGS: CT CHEST FINDINGS Cardiovascular: Atherosclerotic calcification of the aortic arch. Right Port-A-Cath tip: Lower SVC. Narrowing of the lower SVC similar to previous. Small amount of pericardial fluid similar to prior. Mediastinum/Nodes: No pathologic adenopathy identified.  Lungs/Pleura: Increased size of the moderate left pleural effusion. Stable trace right pleural effusion. Similar appearance of consolidation and volume loss in the right upper lobe and right middle lobe with saccular gas density lesion directly connected to the right upper lobe bronchus with gas-filled component measuring about 4.6 by 3.1 cm on image 45 series 6 (formerly 5.0 by 3.6 cm) and a fusiform cavitary or saccular peribronchovascular gas collection along the consolidated anterior right upper lobe likewise slightly smaller than previous. Anterior right upper lobe bronchiectasis is also separately noted. There is a continued narrowed appearance of the right middle lobe bronchus centrally on image 71 of series 6. The bandlike and nodular lesion of the left upper lobe is less striking on today's exam, with the posterior nodular component measuring about 1.1 by 0.7 cm on image 86 series 6, formerly about 1.7 by 1.2 cm. Increased scattered reticulation in portions of the left upper lobe, with some confluent subpleural nodularity in the left upper lobe measuring about 1.0 by 0.7 cm on image 50 of series 6 which is new, and adjacent 5 by 3 mm subpleural nodule on image 55 series 6 which is new. There is also some new indistinct ground-glass density in the left upper lobe on image 65 series 6. This is most likely inflammatory but merit surveillance. The area of concern raised medially in the left upper lobe on the prior exam peers to demonstrate volume loss mostly bandlike opacity for example on image 57 of series 6, and appears less nodular and thickened compared to previous. Musculoskeletal: 40% superior endplate compression fracture at T12 with associated endplate sclerosis, and about 2 mm of posterior bony retropulsion, similar to the 02/15/2021 exam. Sclerosis in the left T9 pedicle and posterior vertebral body on image 96 series 5, similar to prior and also shown on PET-CT of 09/10/2020. Sclerotic T11 spinous  process compatible with prior met metastatic lesion. Previously hypermetabolic rib lesions are only faintly visible on today's exam. No current CT correlate for the previous hypermetabolic activity along the left inferior subscapularis muscle. CT ABDOMEN PELVIS FINDINGS Hepatobiliary: No discrete hepatic lesions are identified, with careful attention applied to the sites of prior hepatic metastatic lesions shown on the PET-CT of 09/10/2020. Gallbladder unremarkable. No biliary dilatation. Pancreas: Unremarkable Spleen: Unremarkable Adrenals/Urinary Tract: Both adrenal glands appear normal.  Indistinctly marginated right kidney upper pole mass, with associated hypodensity measuring about 2.2 by 1.7 cm (previously 2.4 by 1.7 cm), previously hypermetabolic. Stomach/Bowel: Unremarkable Vascular/Lymphatic: Atherosclerosis is present, including aortoiliac atherosclerotic disease. Reproductive: Borderline prostatomegaly. Other: Stable trace presacral edema. Musculoskeletal: Mildly sclerotic lesions in the bony pelvis corresponding to previous metastatic disease. Mild sclerosis anteriorly in the left femoral head corresponding to a prior metastatic focus. No new metastatic lesions are identified. Lower lumbar degenerative disc disease. IMPRESSION: 1. The focus of bandlike density in nodularity which raise concern in the left upper lobe on the prior exam is slightly less prominent today, with reduced prominence of the nodular component. This has not completely resolved. 2. Similarly the region of nodularity and bandlike density medially in the left upper lobe raised as concerning on the prior exam is reduced in thickness but still not completely resolved. 3. There are some new indistinct ground-glass opacities, reticulation, and mild subpleural nodularity in the left upper lobe which has a pattern favoring inflammation but which merit surveillance. 4. Similar appearance of volume loss and consolidation with substantial  saccular bronchiectasis and fusiform bronchiectasis in the right upper lobe and to lesser degree in the right middle lobe. 5. Increase in the left pleural effusion, which is now moderate. 6. Similar manifestations of prior osseous metastatic disease, mostly sclerotic. 7. 40% superior endplate compression fracture at T12 is roughly stable in magnitude from 02/15/2021, and associated with sclerosis and about 2 mm of posterior bony retropulsion. 8. The previously hypermetabolic right kidney upper pole mass is similar to minimally reduced in size compared to the prior exam. 9. Other imaging findings of potential clinical significance: Aortic Atherosclerosis (ICD10-I70.0). Stable mild narrowing of the lower SVC. Lower lumbar degenerative disc disease. Electronically Signed   By: Van Clines M.D.   On: 04/21/2021 12:52   CT Abdomen Pelvis W Contrast  Result Date: 04/21/2021 CLINICAL DATA:  Non-small cell lung cancer restaging. Prior chemotherapy and radiation therapy. T12 compression fracture with associated pain. EXAM: CT CHEST, ABDOMEN, AND PELVIS WITH CONTRAST TECHNIQUE: Multidetector CT imaging of the chest, abdomen and pelvis was performed following the standard protocol during bolus administration of intravenous contrast. CONTRAST:  71m OMNIPAQUE IOHEXOL 350 MG/ML SOLN COMPARISON:  Multiple exams, including 02/15/2021 FINDINGS: CT CHEST FINDINGS Cardiovascular: Atherosclerotic calcification of the aortic arch. Right Port-A-Cath tip: Lower SVC. Narrowing of the lower SVC similar to previous. Small amount of pericardial fluid similar to prior. Mediastinum/Nodes: No pathologic adenopathy identified. Lungs/Pleura: Increased size of the moderate left pleural effusion. Stable trace right pleural effusion. Similar appearance of consolidation and volume loss in the right upper lobe and right middle lobe with saccular gas density lesion directly connected to the right upper lobe bronchus with gas-filled  component measuring about 4.6 by 3.1 cm on image 45 series 6 (formerly 5.0 by 3.6 cm) and a fusiform cavitary or saccular peribronchovascular gas collection along the consolidated anterior right upper lobe likewise slightly smaller than previous. Anterior right upper lobe bronchiectasis is also separately noted. There is a continued narrowed appearance of the right middle lobe bronchus centrally on image 71 of series 6. The bandlike and nodular lesion of the left upper lobe is less striking on today's exam, with the posterior nodular component measuring about 1.1 by 0.7 cm on image 86 series 6, formerly about 1.7 by 1.2 cm. Increased scattered reticulation in portions of the left upper lobe, with some confluent subpleural nodularity in the left upper lobe measuring about 1.0 by 0.7 cm on  image 50 of series 6 which is new, and adjacent 5 by 3 mm subpleural nodule on image 55 series 6 which is new. There is also some new indistinct ground-glass density in the left upper lobe on image 65 series 6. This is most likely inflammatory but merit surveillance. The area of concern raised medially in the left upper lobe on the prior exam peers to demonstrate volume loss mostly bandlike opacity for example on image 57 of series 6, and appears less nodular and thickened compared to previous. Musculoskeletal: 40% superior endplate compression fracture at T12 with associated endplate sclerosis, and about 2 mm of posterior bony retropulsion, similar to the 02/15/2021 exam. Sclerosis in the left T9 pedicle and posterior vertebral body on image 96 series 5, similar to prior and also shown on PET-CT of 09/10/2020. Sclerotic T11 spinous process compatible with prior met metastatic lesion. Previously hypermetabolic rib lesions are only faintly visible on today's exam. No current CT correlate for the previous hypermetabolic activity along the left inferior subscapularis muscle. CT ABDOMEN PELVIS FINDINGS Hepatobiliary: No discrete hepatic  lesions are identified, with careful attention applied to the sites of prior hepatic metastatic lesions shown on the PET-CT of 09/10/2020. Gallbladder unremarkable. No biliary dilatation. Pancreas: Unremarkable Spleen: Unremarkable Adrenals/Urinary Tract: Both adrenal glands appear normal. Indistinctly marginated right kidney upper pole mass, with associated hypodensity measuring about 2.2 by 1.7 cm (previously 2.4 by 1.7 cm), previously hypermetabolic. Stomach/Bowel: Unremarkable Vascular/Lymphatic: Atherosclerosis is present, including aortoiliac atherosclerotic disease. Reproductive: Borderline prostatomegaly. Other: Stable trace presacral edema. Musculoskeletal: Mildly sclerotic lesions in the bony pelvis corresponding to previous metastatic disease. Mild sclerosis anteriorly in the left femoral head corresponding to a prior metastatic focus. No new metastatic lesions are identified. Lower lumbar degenerative disc disease. IMPRESSION: 1. The focus of bandlike density in nodularity which raise concern in the left upper lobe on the prior exam is slightly less prominent today, with reduced prominence of the nodular component. This has not completely resolved. 2. Similarly the region of nodularity and bandlike density medially in the left upper lobe raised as concerning on the prior exam is reduced in thickness but still not completely resolved. 3. There are some new indistinct ground-glass opacities, reticulation, and mild subpleural nodularity in the left upper lobe which has a pattern favoring inflammation but which merit surveillance. 4. Similar appearance of volume loss and consolidation with substantial saccular bronchiectasis and fusiform bronchiectasis in the right upper lobe and to lesser degree in the right middle lobe. 5. Increase in the left pleural effusion, which is now moderate. 6. Similar manifestations of prior osseous metastatic disease, mostly sclerotic. 7. 40% superior endplate compression  fracture at T12 is roughly stable in magnitude from 02/15/2021, and associated with sclerosis and about 2 mm of posterior bony retropulsion. 8. The previously hypermetabolic right kidney upper pole mass is similar to minimally reduced in size compared to the prior exam. 9. Other imaging findings of potential clinical significance: Aortic Atherosclerosis (ICD10-I70.0). Stable mild narrowing of the lower SVC. Lower lumbar degenerative disc disease. Electronically Signed   By: Van Clines M.D.   On: 04/21/2021 12:52     ASSESSMENT/PLAN:  This is a very pleasant 60 year old Caucasian male with metastatic non-small cell lung cancer.  He was initially diagnosed as a stage IIb (T3, N0, M0) non-small cell lung cancer, adenocarcinoma.  He originally presented with a perihilar right upper lobe lung mass and suspicious groundglass opacity in the right middle lobe.  He had evidence of metastatic disease in  March/May 2022 with a biopsy-proven metastatic lesion in the right kidney as well as right pleural space, liver, thoracic/abdominal lymph nodes, and bones.  There is also a probable left-sided pleural metastasis.  He also had a small metastatic brain lesion.  The patient's PD-L1 expression is 80%.  He is negative for any actionable mutations by guardant 360.   The patient was not a good candidate for resection according to Dr. Roxan Hockey.   Therefore, the patient underwent a course of concurrent chemoradiation with weekly carboplatin for an AUC of 2 and paclitaxel 45 mg per metered squared status post 5 cycles.  He experienced some fatigue, mild odynophagia as well as recurrent pneumonia and septicemia.  He also had radiation-induced pneumonitis.  He was admitted to the hospital several times with recurrent pneumonia.    In March 2022, the patient had a biopsy confirmed metastatic lesion to the kidney.    The patient is currently undergoing palliative systemic chemotherapy with carboplatin for an AUC of 5,  Alimta 500 mg per metered squared, Keytruda 200 mg IV every 3 weeks.  He is status post 9 cycles and tolerated it fair except for fatigue 3 to 4 days following treatment as well as nausea without vomiting and decreased appetite. Starting from cycle #5, the patient has been on maintenance alimta and [place]    The patient completed palliative radiation to the painful ribs and mid thoracic metastatic bone lesions under the care of Dr. Sondra Come.  This was completed on 10/05/2020.  The patient also underwent SRS to the metastatic brain lesion under the care of Dr. Isidore Moos which was completed on 10/13/2020.  His next follow-up brain MRI scheduled for 04/29/2021.   Labs were reviewed. Recommend he proceed with cycle number 11 today scheduled.   The patient recently had a restaging CT scan performed.  I will review the results with Dr. Julien Nordmann when he returns to the clinic next week.  The area of concern/bandlike density/nodularity on his previous scan that we have been monitoring is slightly less prominent today and has reduced in thickness.  He has a moderate left pleural effusion.  There is no evidence of disease progression  He will proceed with cycle #11 today as scheduled.  Discussed with the patient that if he becomes symptomatic with worsening shortness of breath, chest heaviness, and cough that we can always arrange for therapeutic thoracentesis.  Discussed I be happy to arrange for at this time or wait until/if he becomes symptomatic.  The patient will think about it and will call us if he becomes more symptomatic or decides he would like to proceed with this.   We will see him back for a follow up visit in 3 weeks for evaluation before starting cycle #12.   For the patient's weight loss, encouraged him to drink supplemental drinks daily to help maintain his weight.  The patient's nausea is controlled with his antiemetic.  Advised him to call us if he feels like he needs an additional antiemetic to  better control his nausea.  He will continue to use oxycodone if needed for pain  The patient was advised to call immediately if he has any concerning symptoms in the interval. The patient voices understanding of current disease status and treatment options and is in agreement with the current care plan. All questions were answered. The patient knows to call the clinic with any problems, questions or concerns. We can certainly see the patient much sooner if necessary    No orders  of the defined types were placed in this encounter.    The total time spent in the appointment was 20-29 minutes.   Cassandra L Heilingoetter, PA-C 04/22/21

## 2021-04-18 ENCOUNTER — Other Ambulatory Visit: Payer: Self-pay | Admitting: Internal Medicine

## 2021-04-19 ENCOUNTER — Other Ambulatory Visit: Payer: Self-pay | Admitting: Internal Medicine

## 2021-04-19 DIAGNOSIS — C3491 Malignant neoplasm of unspecified part of right bronchus or lung: Secondary | ICD-10-CM

## 2021-04-20 ENCOUNTER — Ambulatory Visit (HOSPITAL_COMMUNITY)
Admission: RE | Admit: 2021-04-20 | Discharge: 2021-04-20 | Disposition: A | Discharge status: Home / Self Care | Payer: BC Managed Care – PPO | Source: Ambulatory Visit | Attending: Physician Assistant | Admitting: Physician Assistant

## 2021-04-20 ENCOUNTER — Other Ambulatory Visit: Payer: Self-pay

## 2021-04-20 ENCOUNTER — Encounter: Payer: Self-pay | Admitting: Internal Medicine

## 2021-04-20 DIAGNOSIS — C3490 Malignant neoplasm of unspecified part of unspecified bronchus or lung: Secondary | ICD-10-CM | POA: Diagnosis present

## 2021-04-20 IMAGING — CT CT ABD-PELV W/ CM
2 of 5 series · 11 of 36 positions shown, 13 images · IV contrast (APPLIED)
Comparison: Multiple exams, including [DATE]

CLINICAL DATA: Non-small cell lung cancer restaging. Prior
chemotherapy and radiation therapy. T12 compression fracture with
associated pain.

EXAM:
CT CHEST, ABDOMEN, AND PELVIS WITH CONTRAST
TECHNIQUE: Multidetector CT imaging of the chest, abdomen and pelvis was
performed following the standard protocol during bolus
administration of intravenous contrast.
CONTRAST:  80mL OMNIPAQUE IOHEXOL 350 MG/ML SOLN

[Series 2: cap with · axial · 0.88mm/px · z∈[-689,-84]mm · 8 of 149 slices shown, 10 images]
[im 14/149  mediastinal]
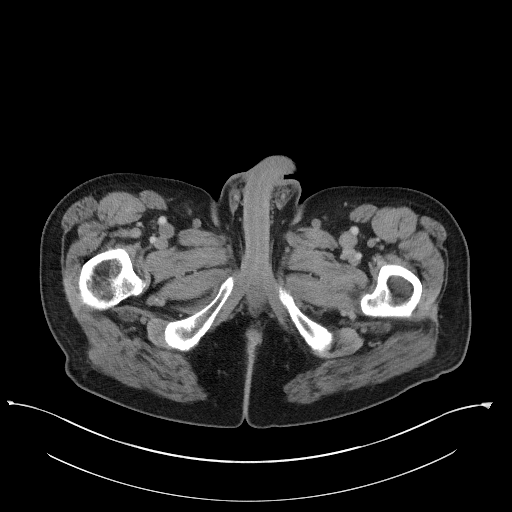
[im 14/149  lung]
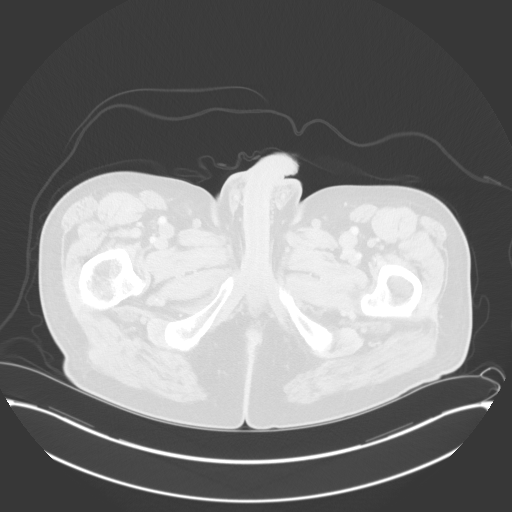
[im 27/149  lung]
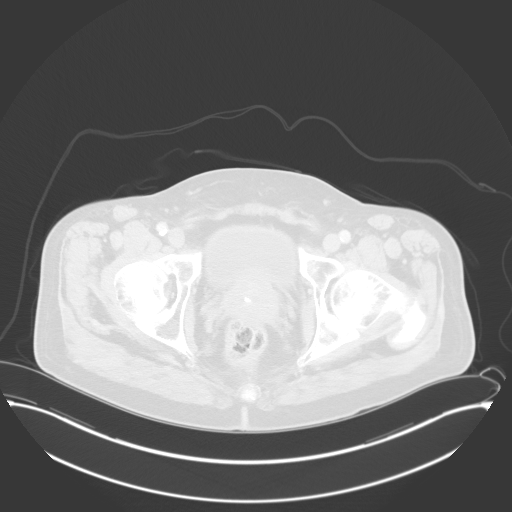
[im 54/149  lung]
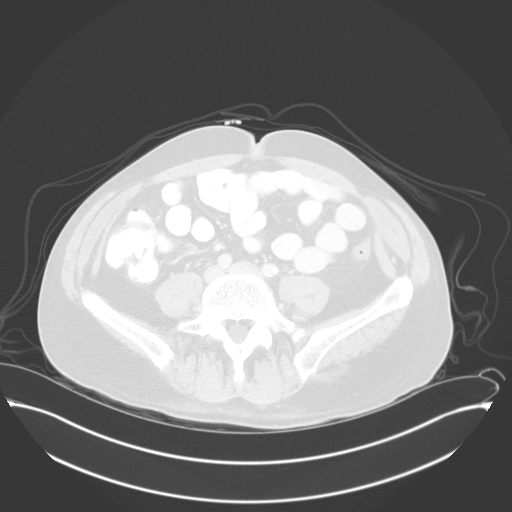
[im 68/149  lung]
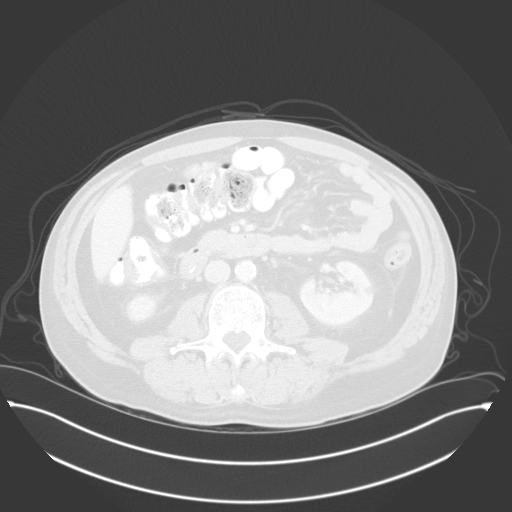
[im 81/149  mediastinal]
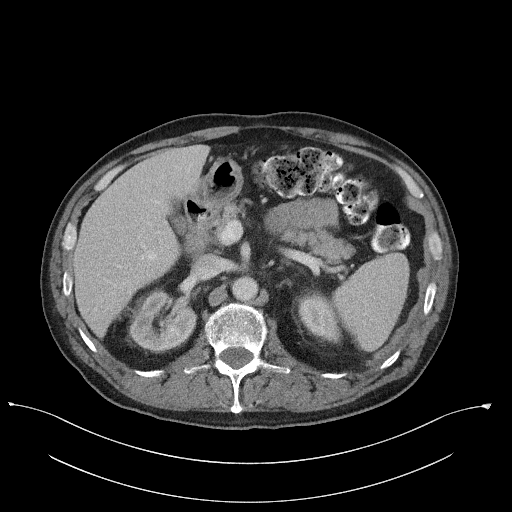
[im 81/149  lung]
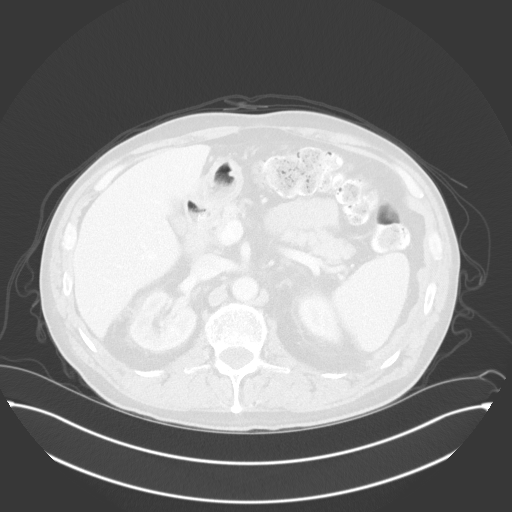
[im 95/149  lung]
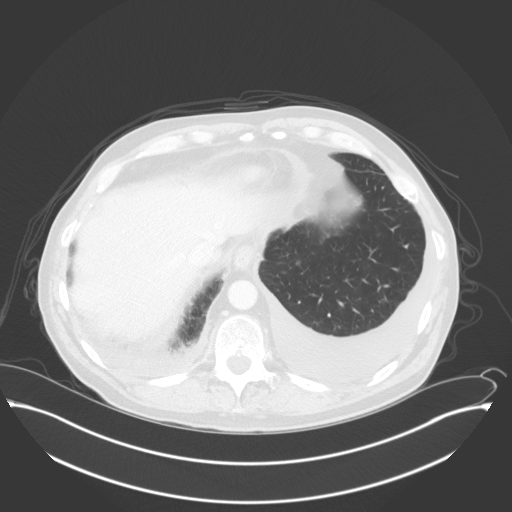
[im 122/149  lung]
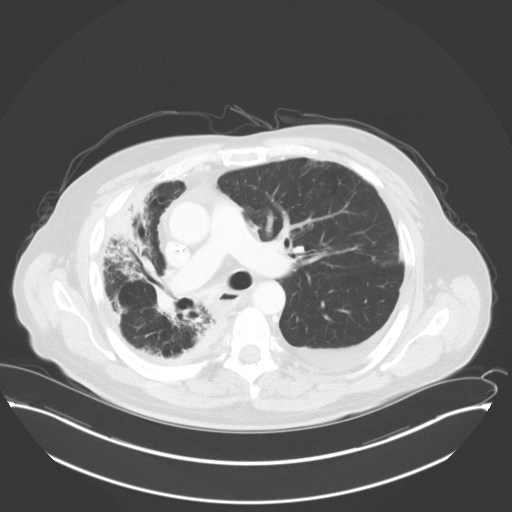
[im 135/149  lung]
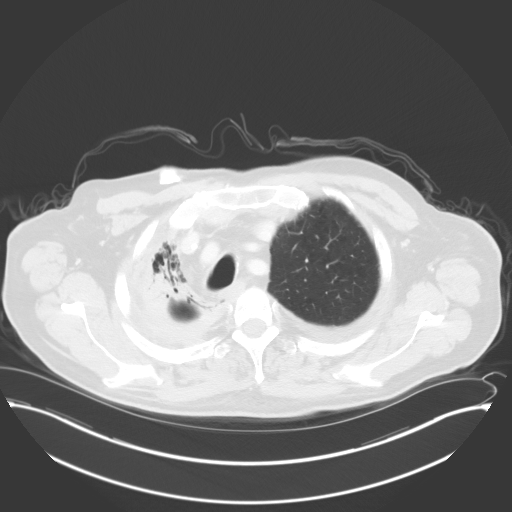

[Series 4: coronals · coronal · 1.00mm/px · 3 of 139 slices shown]
[im 28/139  lung]
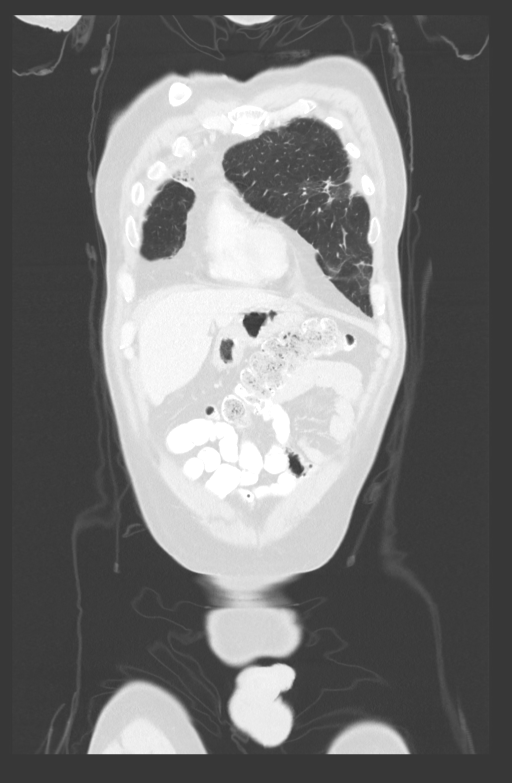
[im 56/139  lung]
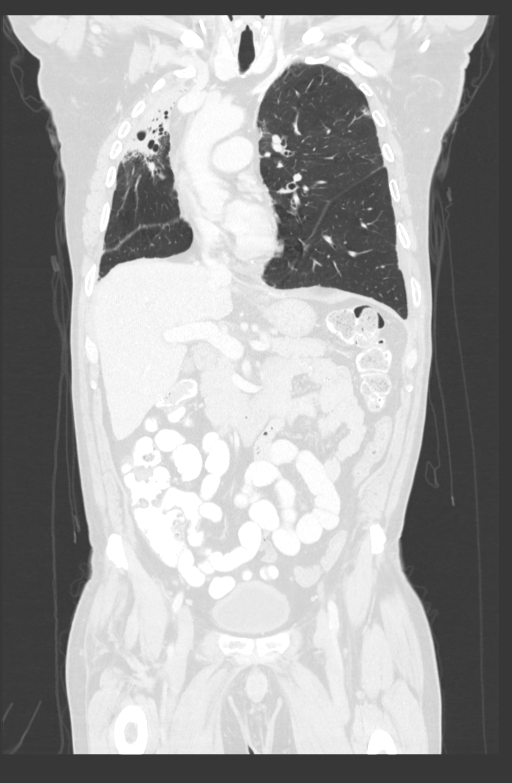
[im 83/139  lung]
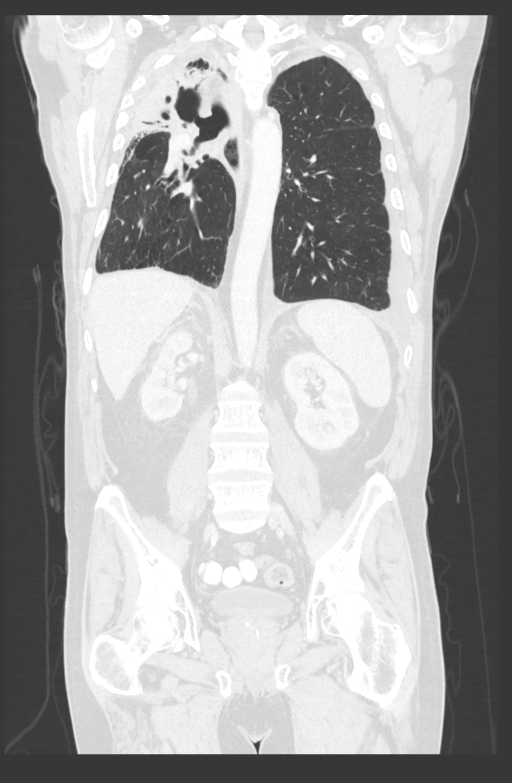

[11 of 36 positions shown; findings below may reference images not displayed]

FINDINGS: CT CHEST FINDINGS

Cardiovascular: Atherosclerotic calcification of the aortic arch.
Right Port-A-Cath tip: Lower SVC. Narrowing of the lower SVC similar
to previous. Small amount of pericardial fluid similar to prior.

Mediastinum/Nodes: No pathologic adenopathy identified.

Lungs/Pleura: Increased size of the moderate left pleural effusion.
Stable trace right pleural effusion. Similar appearance of
consolidation and volume loss in the right upper lobe and right
middle lobe with saccular gas density lesion directly connected to
the right upper lobe bronchus with gas-filled component measuring
about 4.6 by 3.1 cm on image 45 series 6 (formerly 5.0 by 3.6 cm)
and a fusiform cavitary or saccular peribronchovascular gas
collection along the consolidated anterior right upper lobe likewise
slightly smaller than previous. Anterior right upper lobe
bronchiectasis is also separately noted. There is a continued
narrowed appearance of the right middle lobe bronchus centrally on
image 71 of series 6.

The bandlike and nodular lesion of the left upper lobe is less
striking on today's exam, with the posterior nodular component
measuring about 1.1 by 0.7 cm on image 86 series 6, formerly about
1.7 by 1.2 cm. Increased scattered reticulation in portions of the
left upper lobe, with some confluent subpleural nodularity in the
left upper lobe measuring about 1.0 by 0.7 cm on image 50 of series
6 which is new, and adjacent 5 by 3 mm subpleural nodule on image 55
series 6 which is new. There is also some new indistinct
ground-glass density in the left upper lobe on image 65 series 6.
This is most likely inflammatory but merit surveillance.

The area of concern raised medially in the left upper lobe on the
prior exam peers to demonstrate volume loss mostly bandlike opacity
for example on image 57 of series 6, and appears less nodular and
thickened compared to previous.

Musculoskeletal: 40% superior endplate compression fracture at T12
with associated endplate sclerosis, and about 2 mm of posterior bony
retropulsion, similar to the [DATE] exam.

Sclerosis in the left T9 pedicle and posterior vertebral body on
image 96 series 5, similar to prior and also shown on PET-CT of
[DATE]. Sclerotic T11 spinous process compatible with prior met
metastatic lesion. Previously hypermetabolic rib lesions are only
faintly visible on today's exam. No current CT correlate for the
previous hypermetabolic activity along the left inferior
subscapularis muscle.

CT ABDOMEN PELVIS FINDINGS

Hepatobiliary: No discrete hepatic lesions are identified, with
careful attention applied to the sites of prior hepatic metastatic
lesions shown on the PET-CT of [DATE]. Gallbladder unremarkable.
No biliary dilatation.

Pancreas: Unremarkable

Spleen: Unremarkable

Adrenals/Urinary Tract: Both adrenal glands appear normal.

Indistinctly marginated right kidney upper pole mass, with
associated hypodensity measuring about 2.2 by 1.7 cm (previously
by 1.7 cm), previously hypermetabolic.

Stomach/Bowel: Unremarkable

Vascular/Lymphatic: Atherosclerosis is present, including aortoiliac
atherosclerotic disease.

Reproductive: Borderline prostatomegaly.

Other: Stable trace presacral edema.

Musculoskeletal: Mildly sclerotic lesions in the bony pelvis
corresponding to previous metastatic disease. Mild sclerosis
anteriorly in the left femoral head corresponding to a prior
metastatic focus. No new metastatic lesions are identified. Lower
lumbar degenerative disc disease.
IMPRESSION: 1. The focus of bandlike density in nodularity which raise concern
in the left upper lobe on the prior exam is slightly less prominent
today, with reduced prominence of the nodular component. This has
not completely resolved.
2. Similarly the region of nodularity and bandlike density medially
in the left upper lobe raised as concerning on the prior exam is
reduced in thickness but still not completely resolved.
3. There are some new indistinct ground-glass opacities,
reticulation, and mild subpleural nodularity in the left upper lobe
which has a pattern favoring inflammation but which merit
surveillance.
4. Similar appearance of volume loss and consolidation with
substantial saccular bronchiectasis and fusiform bronchiectasis in
the right upper lobe and to lesser degree in the right middle lobe.
5. Increase in the left pleural effusion, which is now moderate.
6. Similar manifestations of prior osseous metastatic disease,
mostly sclerotic.
7. 40% superior endplate compression fracture at T12 is roughly
stable in magnitude from [DATE], and associated with sclerosis
and about 2 mm of posterior bony retropulsion.
8. The previously hypermetabolic right kidney upper pole mass is
similar to minimally reduced in size compared to the prior exam.
9. Other imaging findings of potential clinical significance: Aortic
Atherosclerosis ([L4]-[L4]). Stable mild narrowing of the lower
SVC. Lower lumbar degenerative disc disease.

## 2021-04-20 IMAGING — CT CT CHEST W/ CM
2 of 5 series · 11 of 36 positions shown, 13 images · IV contrast (APPLIED)
Comparison: Multiple exams, including [DATE]

CLINICAL DATA: Non-small cell lung cancer restaging. Prior
chemotherapy and radiation therapy. T12 compression fracture with
associated pain.

EXAM:
CT CHEST, ABDOMEN, AND PELVIS WITH CONTRAST
TECHNIQUE: Multidetector CT imaging of the chest, abdomen and pelvis was
performed following the standard protocol during bolus
administration of intravenous contrast.
CONTRAST:  80mL OMNIPAQUE IOHEXOL 350 MG/ML SOLN

[Series 504: cap with · axial · 0.88mm/px · z∈[-689,-84]mm · 8 of 149 slices shown, 10 images]
[im 14/149  mediastinal]
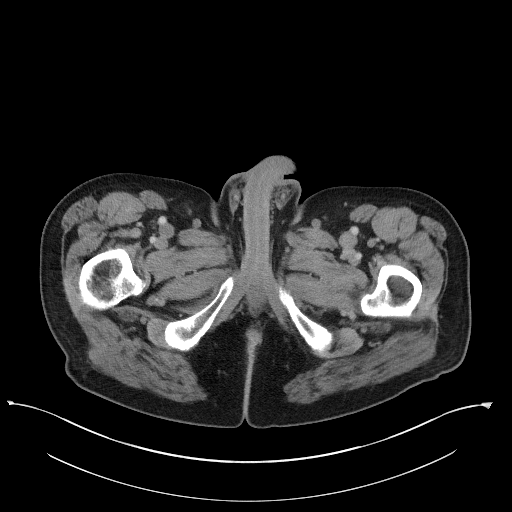
[im 14/149  lung]
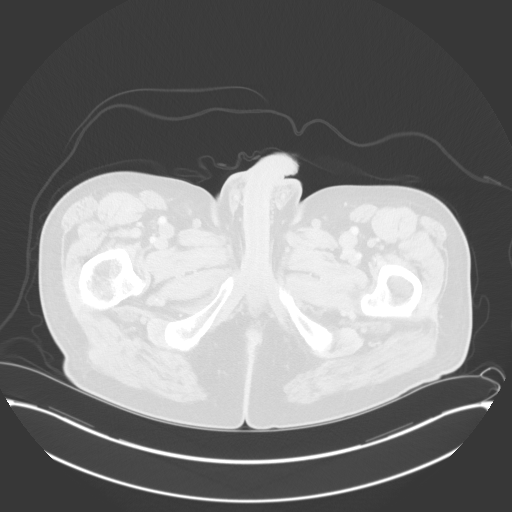
[im 27/149  lung]
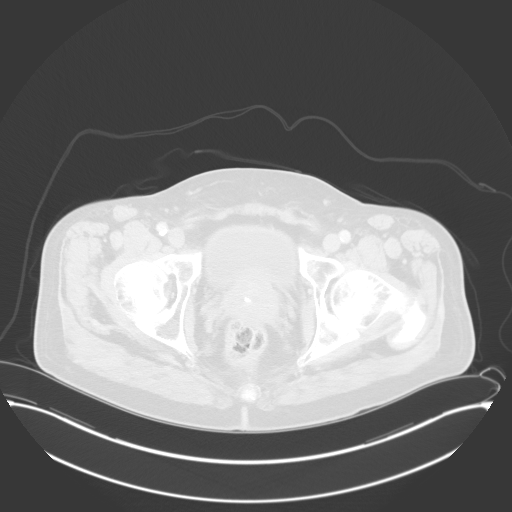
[im 54/149  lung]
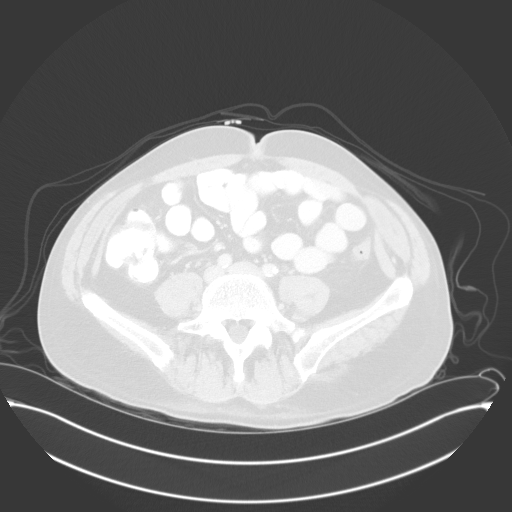
[im 68/149  lung]
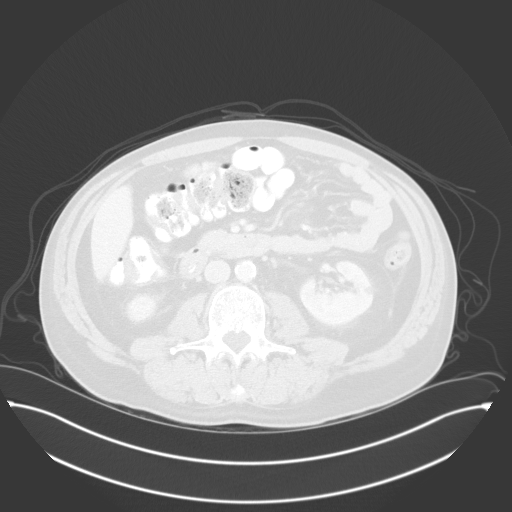
[im 81/149  mediastinal]
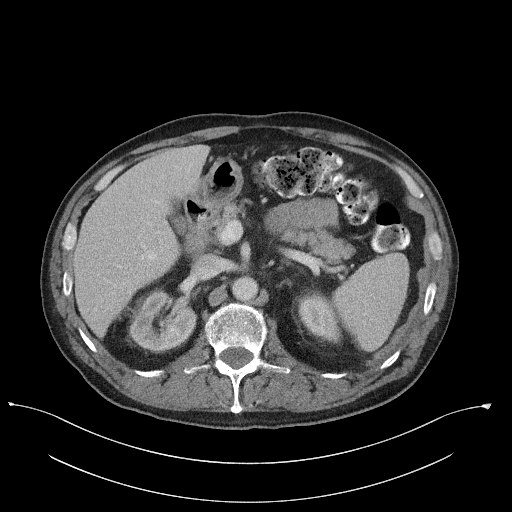
[im 81/149  lung]
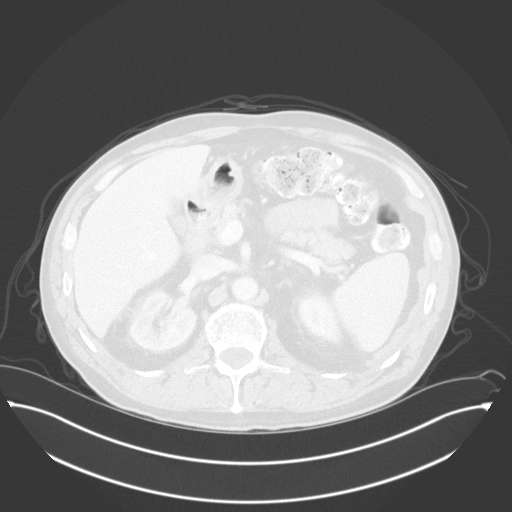
[im 95/149  lung]
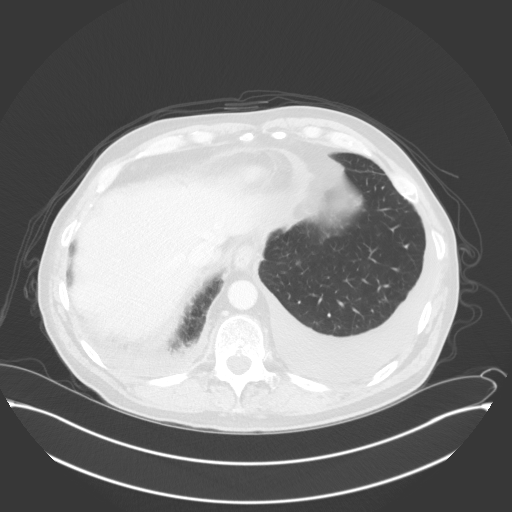
[im 122/149  lung]
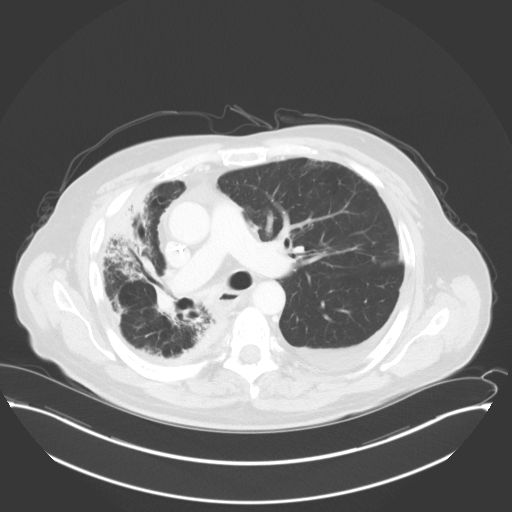
[im 135/149  lung]
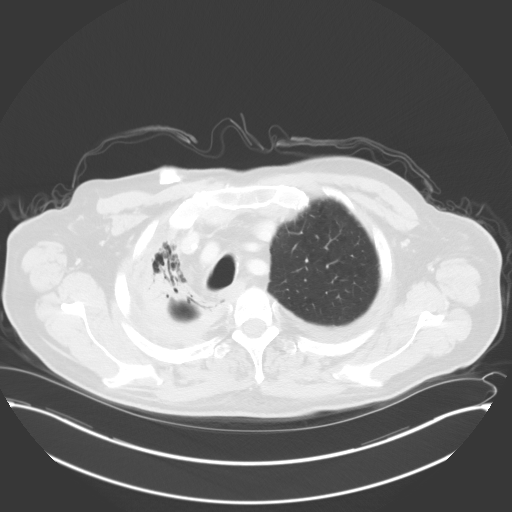

[Series 505: coronals · coronal · 1.00mm/px · 3 of 139 slices shown]
[im 28/139  lung]
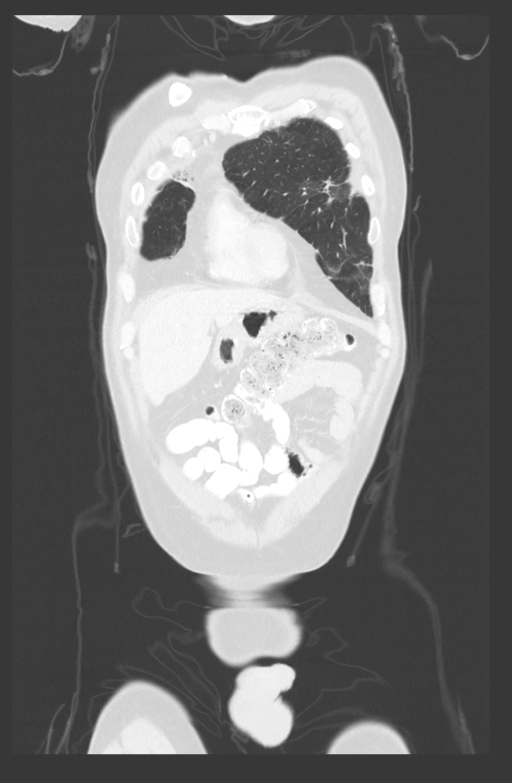
[im 56/139  lung]
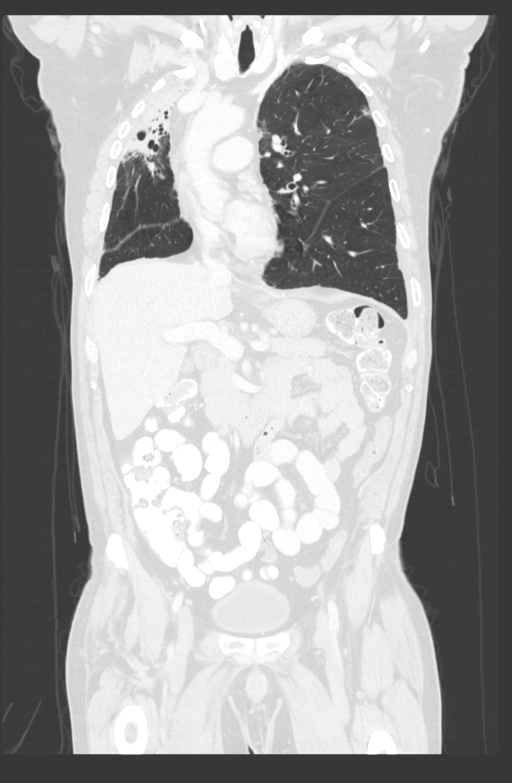
[im 83/139  lung]
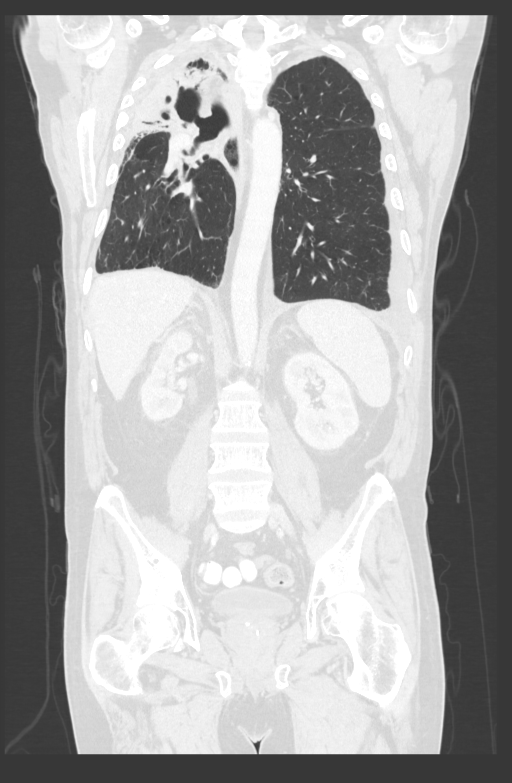

[11 of 36 positions shown; findings below may reference images not displayed]

FINDINGS: CT CHEST FINDINGS

Cardiovascular: Atherosclerotic calcification of the aortic arch.
Right Port-A-Cath tip: Lower SVC. Narrowing of the lower SVC similar
to previous. Small amount of pericardial fluid similar to prior.

Mediastinum/Nodes: No pathologic adenopathy identified.

Lungs/Pleura: Increased size of the moderate left pleural effusion.
Stable trace right pleural effusion. Similar appearance of
consolidation and volume loss in the right upper lobe and right
middle lobe with saccular gas density lesion directly connected to
the right upper lobe bronchus with gas-filled component measuring
about 4.6 by 3.1 cm on image 45 series 6 (formerly 5.0 by 3.6 cm)
and a fusiform cavitary or saccular peribronchovascular gas
collection along the consolidated anterior right upper lobe likewise
slightly smaller than previous. Anterior right upper lobe
bronchiectasis is also separately noted. There is a continued
narrowed appearance of the right middle lobe bronchus centrally on
image 71 of series 6.

The bandlike and nodular lesion of the left upper lobe is less
striking on today's exam, with the posterior nodular component
measuring about 1.1 by 0.7 cm on image 86 series 6, formerly about
1.7 by 1.2 cm. Increased scattered reticulation in portions of the
left upper lobe, with some confluent subpleural nodularity in the
left upper lobe measuring about 1.0 by 0.7 cm on image 50 of series
6 which is new, and adjacent 5 by 3 mm subpleural nodule on image 55
series 6 which is new. There is also some new indistinct
ground-glass density in the left upper lobe on image 65 series 6.
This is most likely inflammatory but merit surveillance.

The area of concern raised medially in the left upper lobe on the
prior exam peers to demonstrate volume loss mostly bandlike opacity
for example on image 57 of series 6, and appears less nodular and
thickened compared to previous.

Musculoskeletal: 40% superior endplate compression fracture at T12
with associated endplate sclerosis, and about 2 mm of posterior bony
retropulsion, similar to the [DATE] exam.

Sclerosis in the left T9 pedicle and posterior vertebral body on
image 96 series 5, similar to prior and also shown on PET-CT of
[DATE]. Sclerotic T11 spinous process compatible with prior met
metastatic lesion. Previously hypermetabolic rib lesions are only
faintly visible on today's exam. No current CT correlate for the
previous hypermetabolic activity along the left inferior
subscapularis muscle.

CT ABDOMEN PELVIS FINDINGS

Hepatobiliary: No discrete hepatic lesions are identified, with
careful attention applied to the sites of prior hepatic metastatic
lesions shown on the PET-CT of [DATE]. Gallbladder unremarkable.
No biliary dilatation.

Pancreas: Unremarkable

Spleen: Unremarkable

Adrenals/Urinary Tract: Both adrenal glands appear normal.

Indistinctly marginated right kidney upper pole mass, with
associated hypodensity measuring about 2.2 by 1.7 cm (previously
by 1.7 cm), previously hypermetabolic.

Stomach/Bowel: Unremarkable

Vascular/Lymphatic: Atherosclerosis is present, including aortoiliac
atherosclerotic disease.

Reproductive: Borderline prostatomegaly.

Other: Stable trace presacral edema.

Musculoskeletal: Mildly sclerotic lesions in the bony pelvis
corresponding to previous metastatic disease. Mild sclerosis
anteriorly in the left femoral head corresponding to a prior
metastatic focus. No new metastatic lesions are identified. Lower
lumbar degenerative disc disease.
IMPRESSION: 1. The focus of bandlike density in nodularity which raise concern
in the left upper lobe on the prior exam is slightly less prominent
today, with reduced prominence of the nodular component. This has
not completely resolved.
2. Similarly the region of nodularity and bandlike density medially
in the left upper lobe raised as concerning on the prior exam is
reduced in thickness but still not completely resolved.
3. There are some new indistinct ground-glass opacities,
reticulation, and mild subpleural nodularity in the left upper lobe
which has a pattern favoring inflammation but which merit
surveillance.
4. Similar appearance of volume loss and consolidation with
substantial saccular bronchiectasis and fusiform bronchiectasis in
the right upper lobe and to lesser degree in the right middle lobe.
5. Increase in the left pleural effusion, which is now moderate.
6. Similar manifestations of prior osseous metastatic disease,
mostly sclerotic.
7. 40% superior endplate compression fracture at T12 is roughly
stable in magnitude from [DATE], and associated with sclerosis
and about 2 mm of posterior bony retropulsion.
8. The previously hypermetabolic right kidney upper pole mass is
similar to minimally reduced in size compared to the prior exam.
9. Other imaging findings of potential clinical significance: Aortic
Atherosclerosis ([L4]-[L4]). Stable mild narrowing of the lower
SVC. Lower lumbar degenerative disc disease.

## 2021-04-20 MED ORDER — IOHEXOL 350 MG/ML SOLN
80.0000 mL | Freq: Once | INTRAVENOUS | Status: AC | PRN
  Administered 2021-04-20: 17:00:00 80 mL via INTRAVENOUS

## 2021-04-20 MED ORDER — SODIUM CHLORIDE (PF) 0.9 % IJ SOLN
INTRAMUSCULAR | Status: AC
  Filled 2021-04-20: qty 50

## 2021-04-21 ENCOUNTER — Ambulatory Visit: Payer: BC Managed Care – PPO | Admitting: Physician Assistant

## 2021-04-21 ENCOUNTER — Other Ambulatory Visit: Payer: BC Managed Care – PPO

## 2021-04-22 ENCOUNTER — Ambulatory Visit: Payer: BC Managed Care – PPO

## 2021-04-22 ENCOUNTER — Inpatient Hospital Stay (HOSPITAL_BASED_OUTPATIENT_CLINIC_OR_DEPARTMENT_OTHER): Payer: BC Managed Care – PPO | Admitting: Physician Assistant

## 2021-04-22 ENCOUNTER — Inpatient Hospital Stay: Payer: BC Managed Care – PPO

## 2021-04-22 ENCOUNTER — Other Ambulatory Visit: Payer: Self-pay

## 2021-04-22 ENCOUNTER — Inpatient Hospital Stay: Payer: BC Managed Care – PPO | Admitting: Physician Assistant

## 2021-04-22 ENCOUNTER — Encounter: Payer: Self-pay | Admitting: Physician Assistant

## 2021-04-22 VITALS — BP 144/75 | HR 102 | Temp 97.8°F | Resp 19 | Ht 70.0 in | Wt 196.5 lb

## 2021-04-22 VITALS — HR 90

## 2021-04-22 DIAGNOSIS — C3491 Malignant neoplasm of unspecified part of right bronchus or lung: Secondary | ICD-10-CM | POA: Diagnosis not present

## 2021-04-22 DIAGNOSIS — Z5112 Encounter for antineoplastic immunotherapy: Secondary | ICD-10-CM

## 2021-04-22 DIAGNOSIS — T451X5A Adverse effect of antineoplastic and immunosuppressive drugs, initial encounter: Secondary | ICD-10-CM

## 2021-04-22 DIAGNOSIS — Z5111 Encounter for antineoplastic chemotherapy: Secondary | ICD-10-CM

## 2021-04-22 DIAGNOSIS — D649 Anemia, unspecified: Secondary | ICD-10-CM

## 2021-04-22 LAB — CBC WITH DIFFERENTIAL (CANCER CENTER ONLY)
Abs Immature Granulocytes: 0.03 10*3/uL (ref 0.00–0.07)
Basophils Absolute: 0 10*3/uL (ref 0.0–0.1)
Basophils Relative: 1 %
Eosinophils Absolute: 0.1 10*3/uL (ref 0.0–0.5)
Eosinophils Relative: 1 %
HCT: 30.2 % — ABNORMAL LOW (ref 39.0–52.0)
Hemoglobin: 10.4 g/dL — ABNORMAL LOW (ref 13.0–17.0)
Immature Granulocytes: 1 %
Lymphocytes Relative: 7 %
Lymphs Abs: 0.5 10*3/uL — ABNORMAL LOW (ref 0.7–4.0)
MCH: 33.2 pg (ref 26.0–34.0)
MCHC: 34.4 g/dL (ref 30.0–36.0)
MCV: 96.5 fL (ref 80.0–100.0)
Monocytes Absolute: 0.9 10*3/uL (ref 0.1–1.0)
Monocytes Relative: 14 %
Neutro Abs: 4.8 10*3/uL (ref 1.7–7.7)
Neutrophils Relative %: 76 %
Platelet Count: 295 10*3/uL (ref 150–400)
RBC: 3.13 MIL/uL — ABNORMAL LOW (ref 4.22–5.81)
RDW: 15.5 % (ref 11.5–15.5)
WBC Count: 6.4 10*3/uL (ref 4.0–10.5)
nRBC: 0 % (ref 0.0–0.2)

## 2021-04-22 LAB — CMP (CANCER CENTER ONLY)
ALT: 10 U/L (ref 0–44)
AST: 21 U/L (ref 15–41)
Albumin: 3.3 g/dL — ABNORMAL LOW (ref 3.5–5.0)
Alkaline Phosphatase: 79 U/L (ref 38–126)
Anion gap: 6 (ref 5–15)
BUN: 10 mg/dL (ref 6–20)
CO2: 27 mmol/L (ref 22–32)
Calcium: 9 mg/dL (ref 8.9–10.3)
Chloride: 106 mmol/L (ref 98–111)
Creatinine: 0.93 mg/dL (ref 0.61–1.24)
GFR, Estimated: >60 mL/min (ref >60)
Glucose, Bld: 90 mg/dL (ref 70–99)
Potassium: 3.3 mmol/L — ABNORMAL LOW (ref 3.5–5.1)
Sodium: 139 mmol/L (ref 135–145)
Total Bilirubin: 0.5 mg/dL (ref 0.3–1.2)
Total Protein: 6.5 g/dL (ref 6.5–8.1)

## 2021-04-22 LAB — TSH: TSH: 1.835 u[IU]/mL (ref 0.320–4.118)

## 2021-04-22 MED ORDER — HEPARIN SOD (PORK) LOCK FLUSH 100 UNIT/ML IV SOLN
500.0000 [IU] | Freq: Once | INTRAVENOUS | Status: AC | PRN
  Administered 2021-04-22: 13:00:00 500 [IU]

## 2021-04-22 MED ORDER — SODIUM CHLORIDE 0.9% FLUSH
10.0000 mL | INTRAVENOUS | Status: DC | PRN
  Administered 2021-04-22: 13:00:00 10 mL

## 2021-04-22 MED ORDER — CYANOCOBALAMIN 1000 MCG/ML IJ SOLN
1000.0000 ug | Freq: Once | INTRAMUSCULAR | Status: AC
  Administered 2021-04-22: 13:00:00 1000 ug via INTRAMUSCULAR

## 2021-04-22 MED ORDER — SODIUM CHLORIDE 0.9 % IV SOLN: Freq: Once | INTRAVENOUS | Status: AC

## 2021-04-22 MED ORDER — PROCHLORPERAZINE MALEATE 10 MG PO TABS
10.0000 mg | ORAL_TABLET | Freq: Once | ORAL | Status: AC
  Administered 2021-04-22: 12:00:00 10 mg via ORAL

## 2021-04-22 MED ORDER — SODIUM CHLORIDE 0.9 % IV SOLN
500.0000 mg/m2 | Freq: Once | INTRAVENOUS | Status: AC
  Administered 2021-04-22: 13:00:00 1000 mg via INTRAVENOUS
  Filled 2021-04-22: qty 40

## 2021-04-22 MED ORDER — SODIUM CHLORIDE 0.9 % IV SOLN
200.0000 mg | Freq: Once | INTRAVENOUS | Status: AC
  Administered 2021-04-22: 12:00:00 200 mg via INTRAVENOUS
  Filled 2021-04-22: qty 8

## 2021-04-23 ENCOUNTER — Inpatient Hospital Stay: Payer: BC Managed Care – PPO

## 2021-04-29 ENCOUNTER — Ambulatory Visit
Admission: RE | Admit: 2021-04-29 | Discharge: 2021-04-29 | Disposition: A | Discharge status: Home / Self Care | Payer: BC Managed Care – PPO | Source: Ambulatory Visit | Attending: Radiation Oncology | Admitting: Radiation Oncology

## 2021-04-29 DIAGNOSIS — C7931 Secondary malignant neoplasm of brain: Secondary | ICD-10-CM

## 2021-04-29 IMAGING — MR MR HEAD WO/W CM
13 series · 48 of 48 positions shown · IV contrast (MULTIHANCE)
Comparison: [DATE]

CLINICAL DATA: Brain/CNS neoplasm, monitor [REDACTED] Protocol.
Follow-up of treated brain. Metastasis to brain, monitor

EXAM:
MRI HEAD WITHOUT AND WITH CONTRAST
TECHNIQUE: Multiplanar, multiecho pulse sequences of the brain and surrounding
structures were obtained without and with intravenous contrast.
CONTRAST:  18mL MULTIHANCE GADOBENATE DIMEGLUMINE 529 MG/ML IV SOLN

[Series 2: FLAIR · sagittal · 3.0mm · 0.75mm/px · 1 of 39 slices shown (1 of 2)]
[im 1/39]
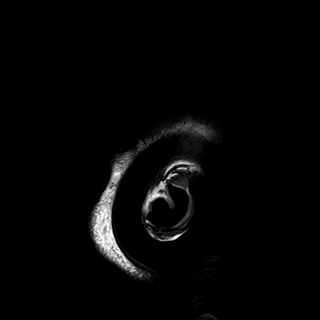

[Series 3: DWI · axial · 3.0mm · 1.50mm/px · z∈[-60,+93]mm · 3 of 84 slices shown (1 of 2)]
[im 1/84]
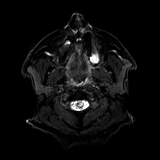
[im 42/84]
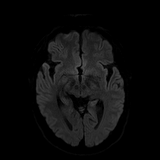
[im 84/84]
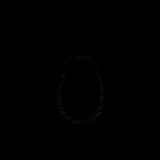

[Series 4: DWI · axial · 3.0mm · 1.50mm/px · z∈[-60,+93]mm · 2 of 42 slices shown (2 of 2)]
[im 1/42]
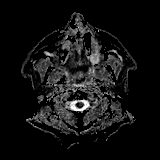
[im 42/42]
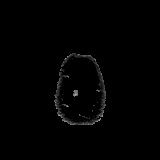

[Series 5: T2 · axial · 5.0mm · 0.60mm/px · 1 of 27 slices shown (1 of 2)]
[im 1/27]
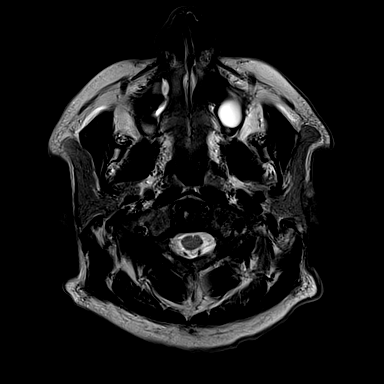

[Series 6: swi_images · axial · 1.5mm · 0.90mm/px · z∈[-46,+90]mm · 4 of 96 slices shown]
[im 1/96]
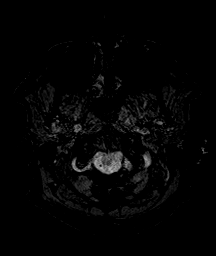
[im 32/96]
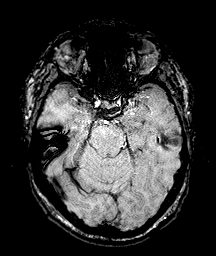
[im 64/96]
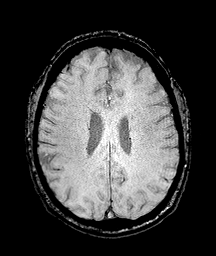
[im 96/96]
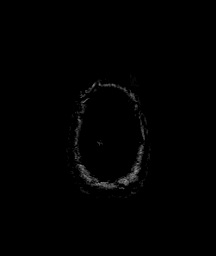

[Series 7: mip_images(sw) · axial · 12.0mm · 0.90mm/px · z∈[-41,+85]mm · 4 of 89 slices shown]
[im 1/89]
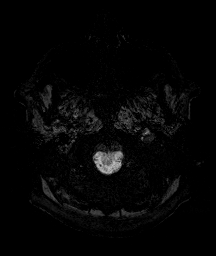
[im 30/89]
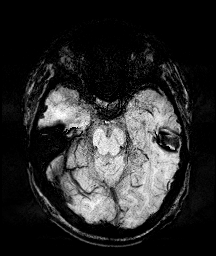
[im 59/89]
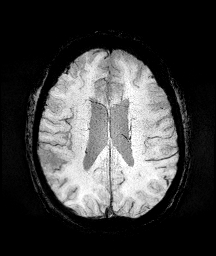
[im 89/89]
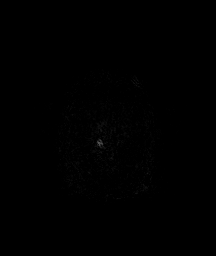

[Series 8: FLAIR · axial · 3.0mm · 0.90mm/px · z∈[-123,+69]mm · 3 of 65 slices shown (2 of 2)]
[im 1/65]
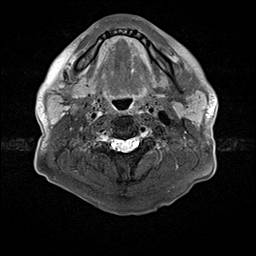
[im 33/65]
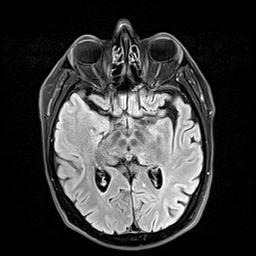
[im 65/65]
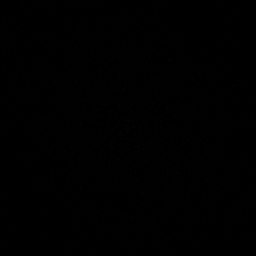

[Series 9: T2 · axial · non-contrast · 1.0mm · 0.90mm/px · z∈[-109,+63]mm · 8 of 176 slices shown (2 of 2)]
[im 1/176]
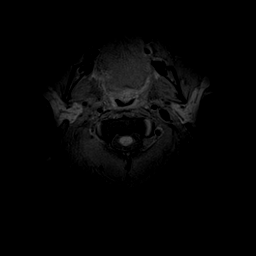
[im 26/176]
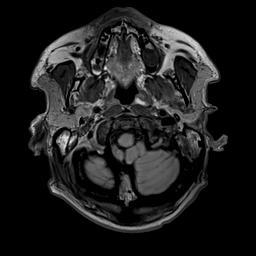
[im 51/176]
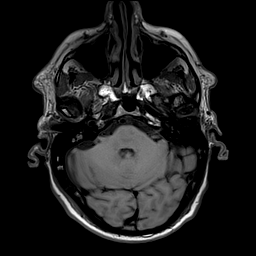
[im 76/176]
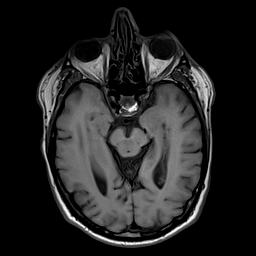
[im 101/176]
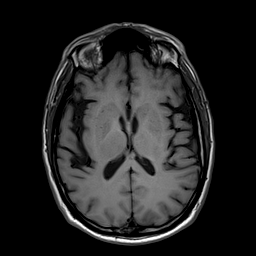
[im 126/176]
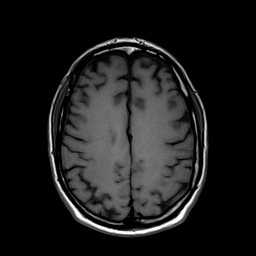
[im 151/176]
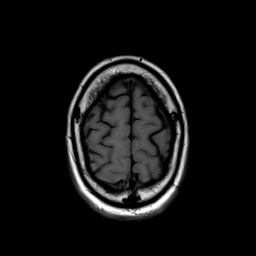
[im 176/176]
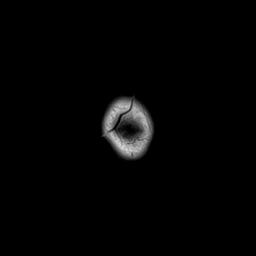

[Series 10: T2 post-contrast · coronal · 3.0mm · 0.57mm/px · 2 of 47 slices shown (1 of 2)]
[im 1/47]
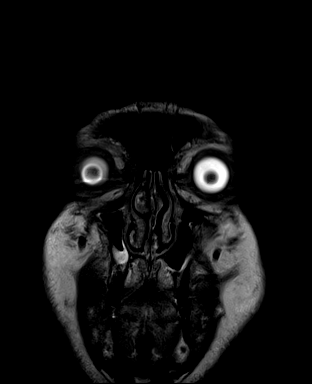
[im 47/47]
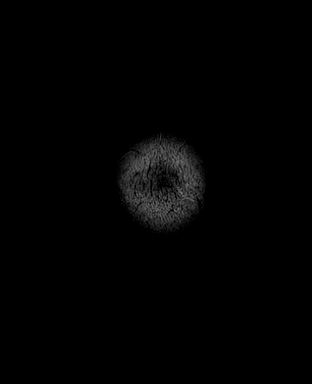

[Series 11: T2 post-contrast · axial · 1.0mm · 0.90mm/px · z∈[-109,+63]mm · 8 of 176 slices shown (2 of 2)]
[im 1/176]
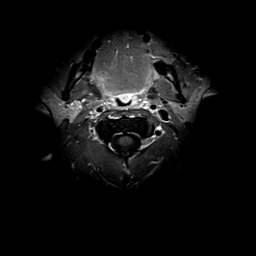
[im 26/176]
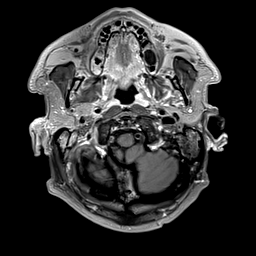
[im 51/176]
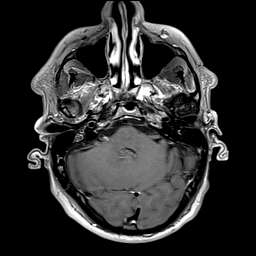
[im 76/176]
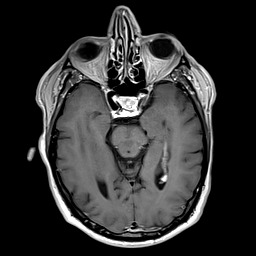
[im 101/176]
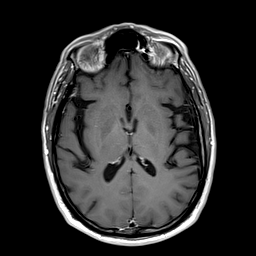
[im 126/176]
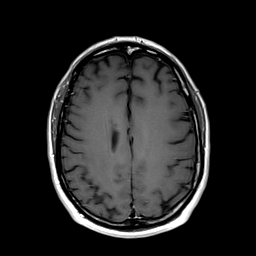
[im 151/176]
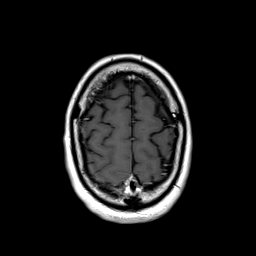
[im 176/176]
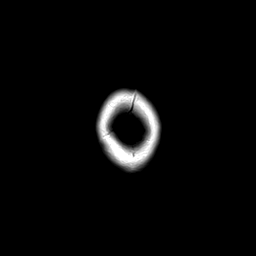

[Series 12: T1 post-contrast · axial · 1.0mm · 0.75mm/px · z∈[-110,+65]mm · 8 of 176 slices shown (1 of 2)]
[im 1/176]
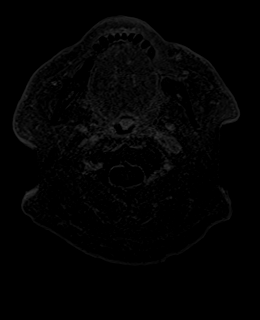
[im 26/176]
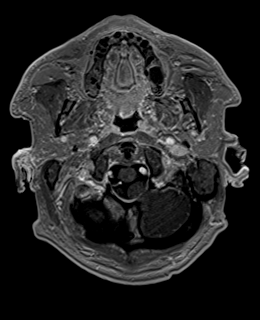
[im 51/176]
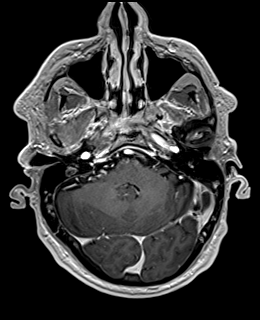
[im 76/176]
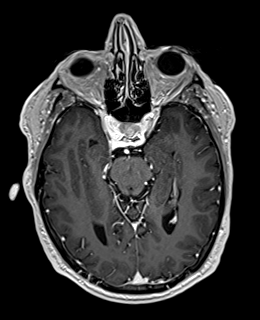
[im 101/176]
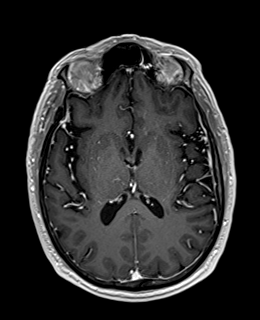
[im 126/176]
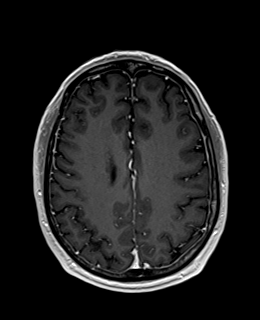
[im 151/176]
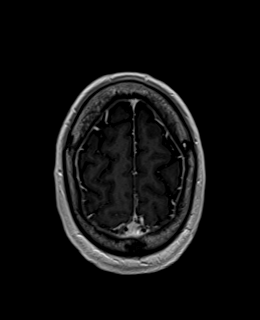
[im 176/176]
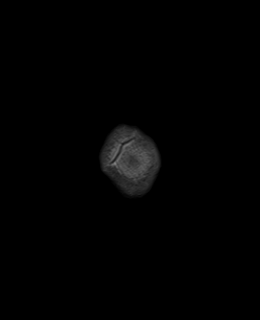

[Series 13: T1 post-contrast · coronal · 3.0mm · 0.57mm/px · 2 of 47 slices shown (2 of 2)]
[im 1/47]
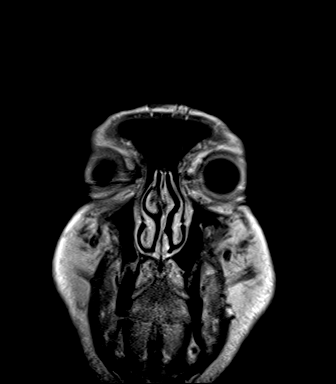
[im 47/47]
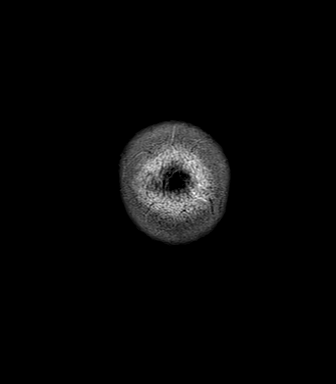

[Series 14: FLAIR post-contrast · sagittal · 3.0mm · 0.75mm/px · 2 of 39 slices shown]
[im 1/39]
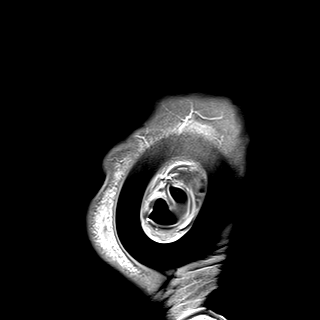
[im 39/39]
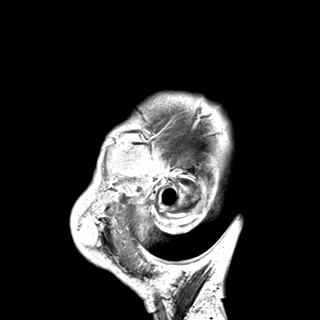

[48 of 48 positions shown; findings below may reference images not displayed]

FINDINGS: Brain: No acute infarct, mass effect or extra-axial collection.
Focus of chronic microhemorrhage in the cerebellum, unchanged.
Normal white matter signal, parenchymal volume and CSF spaces. The
midline structures are normal. Unchanged 3 mm contrast enhancing
lesion of the medial right cerebellum. No new contrast-enhancing
lesions.

Vascular: Major flow voids are preserved.

Skull and upper cervical spine: Normal calvarium and skull base.
Visualized upper cervical spine and soft tissues are normal.

Sinuses/Orbits:No paranasal sinus fluid levels or advanced mucosal
thickening. No mastoid or middle ear effusion. Normal orbits.
IMPRESSION: Unchanged 3 mm metastasis of the medial right cerebellum. No new
contrast-enhancing lesions.

## 2021-04-29 MED ORDER — GADOBENATE DIMEGLUMINE 529 MG/ML IV SOLN
18.0000 mL | Freq: Once | INTRAVENOUS | Status: AC | PRN
  Administered 2021-04-29: 18 mL via INTRAVENOUS

## 2021-04-30 ENCOUNTER — Ambulatory Visit: Payer: BC Managed Care – PPO | Admitting: Pulmonary Disease

## 2021-04-30 ENCOUNTER — Other Ambulatory Visit: Payer: Self-pay

## 2021-04-30 ENCOUNTER — Encounter: Payer: Self-pay | Admitting: Pulmonary Disease

## 2021-04-30 VITALS — BP 118/68 | HR 110 | Temp 97.8°F | Ht 70.0 in | Wt 195.8 lb

## 2021-04-30 DIAGNOSIS — R0602 Shortness of breath: Secondary | ICD-10-CM

## 2021-04-30 DIAGNOSIS — C3491 Malignant neoplasm of unspecified part of right bronchus or lung: Secondary | ICD-10-CM | POA: Diagnosis not present

## 2021-04-30 DIAGNOSIS — J90 Pleural effusion, not elsewhere classified: Secondary | ICD-10-CM

## 2021-04-30 NOTE — Patient Instructions (Addendum)
Nice to see you again  Decrease the doxycycline to 1 pill a day for 7 days then stop.  See if the mucus or cough gets any worse.  The status the same continue to stay off of doxycycline.  Asked Dr. Julien Nordmann about Dronabinol for the nausea since it seems to be ongoing and significantly affecting her quality of life.  Return to clinic in 3 months or sooner as needed with Dr. Silas Flood

## 2021-04-30 NOTE — Progress Notes (Signed)
@Patient  ID: Richard Li, male    DOB: Sep 17, 1960, 61 y.o.   MRN: [id number]  Chief Complaint  Patient presents with   Follow-up    Follow up. Pt states he is not breathing too well. Last week Cancer doctor says that there is a lot of fluid in left lung     Referring provider: [name], DO  HPI:   61 y.o. man with diagnosed right-sided lung cancer 12/2019 via bronchoscopy/EBUS who spent essentially all of November in the hospital for recurrent right-sided pneumonia whom are seeing in follow-up of the same.  Patient returns to clinic for routine follow-up.  At last visit we stop doxycycline.  He developed onset of phlegm, worsening cough.  Resumed doxycycline thereafter.  However, cough is not improved or changed.  He feels a bit more short of breath.  Describes left-sided shocklike pain.  Worse when he lies on it.  Feels like gravel in his skin.  He has new left-sided pleural effusion on review of his recent CT scan.  He is curious if this is related to the pain.   HPI at initial visit: Started with productive cough it seems early November.  This is prompted 4 separate hospitalizations and multiple antibiotic courses.  He had recently completed first cycle of carboplatin/paclitaxel.  Was in the midst of radiation therapy to the right upper lobe mass which he completed around Thanksgiving 2021.  He has had cough productive of green sputum.  It is foul tasting.  Smells bad.  Despite multiple rounds of antibiotic is not improved.  His shortness of breath is worsening.  The most recent hospitalization 03/2020 a bronchoscopy was pursued. Mucopurulent secretions noted emanating from RUL. Secretions likely aspirated seen in BI, RLL. Culture with OP flora. No cell counts sent.  Continually coughing, producing sputum as above. Worried because not getting chemotherapy for cancer. Recently completed coures 14 day prednisone for possible radiation pneumonitis. This did not help symptoms. Finishing  course of augmentin. Not seen improvement.  Serial CXRs reviewed which show persistent and worsening R mid and upper lung field opacities. CT 02/2020 reviewed with necrotic appearing mass near RUL takeoff centrally, dense consolidations posterior RUL and throughout R with scattered GGOs in left lung.   Questionaires / Pulmonary Flowsheets:   ACT:  No flowsheet data found.  MMRC: mMRC Dyspnea Scale mMRC Score  06/05/2020 2    Epworth:  No flowsheet data found.  Tests:   FENO:  No results found for: NITRICOXIDE  PFT: No flowsheet data found.  WALK:  No flowsheet data found.  Imaging: Personally reviewed and as per EMR and discussion this note  CT Chest W Contrast  Result Date: 04/21/2021 CLINICAL DATA:  Non-small cell lung cancer restaging. Prior chemotherapy and radiation therapy. T12 compression fracture with associated pain. EXAM: CT CHEST, ABDOMEN, AND PELVIS WITH CONTRAST TECHNIQUE: Multidetector CT imaging of the chest, abdomen and pelvis was performed following the standard protocol during bolus administration of intravenous contrast. CONTRAST:  25m OMNIPAQUE IOHEXOL 350 MG/ML SOLN COMPARISON:  Multiple exams, including 02/15/2021 FINDINGS: CT CHEST FINDINGS Cardiovascular: Atherosclerotic calcification of the aortic arch. Right Port-A-Cath tip: Lower SVC. Narrowing of the lower SVC similar to previous. Small amount of pericardial fluid similar to prior. Mediastinum/Nodes: No pathologic adenopathy identified. Lungs/Pleura: Increased size of the moderate left pleural effusion. Stable trace right pleural effusion. Similar appearance of consolidation and volume loss in the right upper lobe and right middle lobe with saccular gas density lesion directly connected to  the right upper lobe bronchus with gas-filled component measuring about 4.6 by 3.1 cm on image 45 series 6 (formerly 5.0 by 3.6 cm) and a fusiform cavitary or saccular peribronchovascular gas collection along the  consolidated anterior right upper lobe likewise slightly smaller than previous. Anterior right upper lobe bronchiectasis is also separately noted. There is a continued narrowed appearance of the right middle lobe bronchus centrally on image 71 of series 6. The bandlike and nodular lesion of the left upper lobe is less striking on today's exam, with the posterior nodular component measuring about 1.1 by 0.7 cm on image 86 series 6, formerly about 1.7 by 1.2 cm. Increased scattered reticulation in portions of the left upper lobe, with some confluent subpleural nodularity in the left upper lobe measuring about 1.0 by 0.7 cm on image 50 of series 6 which is new, and adjacent 5 by 3 mm subpleural nodule on image 55 series 6 which is new. There is also some new indistinct ground-glass density in the left upper lobe on image 65 series 6. This is most likely inflammatory but merit surveillance. The area of concern raised medially in the left upper lobe on the prior exam peers to demonstrate volume loss mostly bandlike opacity for example on image 57 of series 6, and appears less nodular and thickened compared to previous. Musculoskeletal: 40% superior endplate compression fracture at T12 with associated endplate sclerosis, and about 2 mm of posterior bony retropulsion, similar to the 02/15/2021 exam. Sclerosis in the left T9 pedicle and posterior vertebral body on image 96 series 5, similar to prior and also shown on PET-CT of 09/10/2020. Sclerotic T11 spinous process compatible with prior met metastatic lesion. Previously hypermetabolic rib lesions are only faintly visible on today's exam. No current CT correlate for the previous hypermetabolic activity along the left inferior subscapularis muscle. CT ABDOMEN PELVIS FINDINGS Hepatobiliary: No discrete hepatic lesions are identified, with careful attention applied to the sites of prior hepatic metastatic lesions shown on the PET-CT of 09/10/2020. Gallbladder unremarkable.  No biliary dilatation. Pancreas: Unremarkable Spleen: Unremarkable Adrenals/Urinary Tract: Both adrenal glands appear normal. Indistinctly marginated right kidney upper pole mass, with associated hypodensity measuring about 2.2 by 1.7 cm (previously 2.4 by 1.7 cm), previously hypermetabolic. Stomach/Bowel: Unremarkable Vascular/Lymphatic: Atherosclerosis is present, including aortoiliac atherosclerotic disease. Reproductive: Borderline prostatomegaly. Other: Stable trace presacral edema. Musculoskeletal: Mildly sclerotic lesions in the bony pelvis corresponding to previous metastatic disease. Mild sclerosis anteriorly in the left femoral head corresponding to a prior metastatic focus. No new metastatic lesions are identified. Lower lumbar degenerative disc disease. IMPRESSION: 1. The focus of bandlike density in nodularity which raise concern in the left upper lobe on the prior exam is slightly less prominent today, with reduced prominence of the nodular component. This has not completely resolved. 2. Similarly the region of nodularity and bandlike density medially in the left upper lobe raised as concerning on the prior exam is reduced in thickness but still not completely resolved. 3. There are some new indistinct ground-glass opacities, reticulation, and mild subpleural nodularity in the left upper lobe which has a pattern favoring inflammation but which merit surveillance. 4. Similar appearance of volume loss and consolidation with substantial saccular bronchiectasis and fusiform bronchiectasis in the right upper lobe and to lesser degree in the right middle lobe. 5. Increase in the left pleural effusion, which is now moderate. 6. Similar manifestations of prior osseous metastatic disease, mostly sclerotic. 7. 40% superior endplate compression fracture at T12 is roughly stable in magnitude  from 02/15/2021, and associated with sclerosis and about 2 mm of posterior bony retropulsion. 8. The previously  hypermetabolic right kidney upper pole mass is similar to minimally reduced in size compared to the prior exam. 9. Other imaging findings of potential clinical significance: Aortic Atherosclerosis (ICD10-I70.0). Stable mild narrowing of the lower SVC. Lower lumbar degenerative disc disease. Electronically Signed   By: Van Clines M.D.   On: 04/21/2021 12:52   MR Brain W Wo Contrast  Result Date: 04/29/2021 CLINICAL DATA:  Brain/CNS neoplasm, monitor 3T SRS Protocol. Follow-up of treated brain. Metastasis to brain, monitor EXAM: MRI HEAD WITHOUT AND WITH CONTRAST TECHNIQUE: Multiplanar, multiecho pulse sequences of the brain and surrounding structures were obtained without and with intravenous contrast. CONTRAST:  34m MULTIHANCE GADOBENATE DIMEGLUMINE 529 MG/ML IV SOLN COMPARISON:  01/15/2021 FINDINGS: Brain: No acute infarct, mass effect or extra-axial collection. Focus of chronic microhemorrhage in the cerebellum, unchanged. Normal white matter signal, parenchymal volume and CSF spaces. The midline structures are normal. Unchanged 3 mm contrast enhancing lesion of the medial right cerebellum. No new contrast-enhancing lesions. Vascular: Major flow voids are preserved. Skull and upper cervical spine: Normal calvarium and skull base. Visualized upper cervical spine and soft tissues are normal. Sinuses/Orbits:No paranasal sinus fluid levels or advanced mucosal thickening. No mastoid or middle ear effusion. Normal orbits. 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 No new contrast-enhancing lesions. Electronically Signed   By: KUlyses JarredM.D.   On: 04/29/2021 13:29   CT Abdomen Pelvis W Contrast  Result Date: 04/21/2021 CLINICAL DATA:  Non-small cell lung cancer restaging. Prior chemotherapy and radiation therapy. T12 compression fracture with associated pain. EXAM: CT CHEST, ABDOMEN, AND PELVIS WITH CONTRAST TECHNIQUE: Multidetector CT imaging of the chest, abdomen and  pelvis was performed following the standard protocol during bolus administration of intravenous contrast. CONTRAST:  87mOMNIPAQUE IOHEXOL 350 MG/ML SOLN COMPARISON:  Multiple exams, including 02/15/2021 FINDINGS: CT CHEST FINDINGS Cardiovascular: Atherosclerotic calcification of the aortic arch. Right Port-A-Cath tip: Lower SVC. Narrowing of the lower SVC similar to previous. Small amount of pericardial fluid similar to prior. Mediastinum/Nodes: No pathologic adenopathy identified. Lungs/Pleura: Increased size of the moderate left pleural effusion. Stable trace right pleural effusion. Similar appearance of consolidation and volume loss in the right upper lobe and right middle lobe with saccular gas density lesion directly connected to the right upper lobe bronchus with gas-filled component measuring about 4.6 by 3.1 cm on image 45 series 6 (formerly 5.0 by 3.6 cm) and a fusiform cavitary or saccular peribronchovascular gas collection along the consolidated anterior right upper lobe likewise slightly smaller than previous. Anterior right upper lobe bronchiectasis is also separately noted. There is a continued narrowed appearance of the right middle lobe bronchus centrally on image 71 of series 6. The bandlike and nodular lesion of the left upper lobe is less striking on today's exam, with the posterior nodular component measuring about 1.1 by 0.7 cm on image 86 series 6, formerly about 1.7 by 1.2 cm. Increased scattered reticulation in portions of the left upper lobe, with some confluent subpleural nodularity in the left upper lobe measuring about 1.0 by 0.7 cm on image 50 of series 6 which is new, and adjacent 5 by 3 mm subpleural nodule on image 55 series 6 which is new. There is also some new indistinct ground-glass density in the left upper lobe on image 65 series 6. This is most likely inflammatory but merit surveillance. The area of concern raised medially  in the left upper lobe on the prior exam peers to  demonstrate volume loss mostly bandlike opacity for example on image 57 of series 6, and appears less nodular and thickened compared to previous. Musculoskeletal: 40% superior endplate compression fracture at T12 with associated endplate sclerosis, and about 2 mm of posterior bony retropulsion, similar to the 02/15/2021 exam. Sclerosis in the left T9 pedicle and posterior vertebral body on image 96 series 5, similar to prior and also shown on PET-CT of 09/10/2020. Sclerotic T11 spinous process compatible with prior met metastatic lesion. Previously hypermetabolic rib lesions are only faintly visible on today's exam. No current CT correlate for the previous hypermetabolic activity along the left inferior subscapularis muscle. CT ABDOMEN PELVIS FINDINGS Hepatobiliary: No discrete hepatic lesions are identified, with careful attention applied to the sites of prior hepatic metastatic lesions shown on the PET-CT of 09/10/2020. Gallbladder unremarkable. No biliary dilatation. Pancreas: Unremarkable Spleen: Unremarkable Adrenals/Urinary Tract: Both adrenal glands appear normal. Indistinctly marginated right kidney upper pole mass, with associated hypodensity measuring about 2.2 by 1.7 cm (previously 2.4 by 1.7 cm), previously hypermetabolic. Stomach/Bowel: Unremarkable Vascular/Lymphatic: Atherosclerosis is present, including aortoiliac atherosclerotic disease. Reproductive: Borderline prostatomegaly. Other: Stable trace presacral edema. Musculoskeletal: Mildly sclerotic lesions in the bony pelvis corresponding to previous metastatic disease. Mild sclerosis anteriorly in the left femoral head corresponding to a prior metastatic focus. No new metastatic lesions are identified. Lower lumbar degenerative disc disease. IMPRESSION: 1. The focus of bandlike density in nodularity which raise concern in the left upper lobe on the prior exam is slightly less prominent today, with reduced prominence of the nodular component. This  has not completely resolved. 2. Similarly the region of nodularity and bandlike density medially in the left upper lobe raised as concerning on the prior exam is reduced in thickness but still not completely resolved. 3. There are some new indistinct ground-glass opacities, reticulation, and mild subpleural nodularity in the left upper lobe which has a pattern favoring inflammation but which merit surveillance. 4. Similar appearance of volume loss and consolidation with substantial saccular bronchiectasis and fusiform bronchiectasis in the right upper lobe and to lesser degree in the right middle lobe. 5. Increase in the left pleural effusion, which is now moderate. 6. Similar manifestations of prior osseous metastatic disease, mostly sclerotic. 7. 40% superior endplate compression fracture at T12 is roughly stable in magnitude from 02/15/2021, and associated with sclerosis and about 2 mm of posterior bony retropulsion. 8. The previously hypermetabolic right kidney upper pole mass is similar to minimally reduced in size compared to the prior exam. 9. Other imaging findings of potential clinical significance: Aortic Atherosclerosis (ICD10-I70.0). Stable mild narrowing of the lower SVC. Lower lumbar degenerative disc disease. Electronically Signed   By: Van Clines M.D.   On: 04/21/2021 12:52     Lab Results: Personally reviewed CBC    Component Value Date/Time   WBC 6.4 04/22/2021 1019   WBC 7.9 08/20/2020 0941   RBC 3.13 (L) 04/22/2021 1019   HGB 10.4 (L) 04/22/2021 1019   HCT 30.2 (L) 04/22/2021 1019   PLT 295 04/22/2021 1019   MCV 96.5 04/22/2021 1019   MCH 33.2 04/22/2021 1019   MCHC 34.4 04/22/2021 1019   RDW 15.5 04/22/2021 1019   LYMPHSABS 0.5 (L) 04/22/2021 1019   MONOABS 0.9 04/22/2021 1019   EOSABS 0.1 04/22/2021 1019   BASOSABS 0.0 04/22/2021 1019    BMET    Component Value Date/Time   NA 139 04/22/2021 1019   K  3.3 (L) 04/22/2021 1019   CL 106 04/22/2021 1019   CO2 27  04/22/2021 1019   GLUCOSE 90 04/22/2021 1019   BUN 10 04/22/2021 1019   CREATININE 0.93 04/22/2021 1019   CREATININE 0.67 (L) 06/24/2020 1517   CALCIUM 9.0 04/22/2021 1019   GFRNONAA >60 04/22/2021 1019   GFRNONAA 105 06/24/2020 1517   GFRAA 122 06/24/2020 1517    BNP No results found for: BNP  ProBNP No results found for: PROBNP  Specialty Problems       Pulmonary Problems   Allergic rhinitis    Qualifier: Diagnosis of  By: Julien Girt CMA, Leigh        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HCC)    Qualifier: Diagnosis of  By: Julien Girt CMA, Leigh        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   Pulmonary nodules/lesions, multiple   Adenocarcinoma of right lung, stage 2 (HCC)   SOB (shortness of breath)   Recurrent pneumonia   Adenocarcinoma of right lung, stage 4 (HCC)    Allergies  Allergen Reactions   Penicillins     Unknown reaction   Amoxicillin Rash    Immunization History  Administered Date(s) Administered   Influenza Split 01/25/2012   Influenza Whole 02/13/2009, 01/25/2010   Influenza,inj,Quad PF,6+ Mos 03/30/2015, 02/16/2016, 02/27/2017, 01/24/2018, 03/01/2019, 02/10/2020, 02/16/2021   Influenza,inj,quad, With Preservative 01/23/2017   Tdap 12/12/2011    Past Medical History:  Diagnosis Date   Allergic rhinitis, cause unspecified    Anxiety state, unspecified    Asthma    as a child   Chronic airway obstruction, not elsewhere classified    Esophageal reflux    History of kidney stones    History of radiation therapy 02/13/2020-03/24/2020   right lung        Dr Gery Pray   Hypertension    Lumbago    lung ca dx'd 12/2019   Other and unspecified hyperlipidemia    Other chest pain     Tobacco History: Social History   Tobacco Use  Smoking Status Former   Packs/day: 2.00   Years: 28.00   Pack years: 56.00   Types: Cigarettes   Quit date: 04/25/2005   Years since quitting: 16.0  Smokeless Tobacco  Never   Counseling given: Not Answered   Continue to not smoke  Outpatient Encounter Medications as of 04/30/2021  Medication Sig   acetaminophen (TYLENOL) 500 MG tablet Take 1,000 mg by mouth 2 (two) times daily.   COMBIVENT RESPIMAT 20-100 MCG/ACT AERS respimat INHALE 1 TO 2 PUFFS BY MOUTH EVERY 6 HOURS AS NEEDED FOR WHEEZING   dextromethorphan-guaiFENesin (MUCINEX DM) 30-600 MG 12hr tablet Take 1 tablet by mouth 2 (two) times daily.   doxycycline (VIBRA-TABS) 100 MG tablet Take 1 tablet by mouth twice daily   feeding supplement (ENSURE ENLIVE / ENSURE PLUS) LIQD Take 237 mLs by mouth 3 (three) times daily between meals. (Patient taking differently: Take 237 mLs by mouth daily as needed.)   fenofibrate 160 MG tablet Take 1 tablet by mouth once daily (Patient taking differently: Take 160 mg by mouth daily.)   ferrous sulfate 325 (65 FE) MG tablet Take 1 tablet (325 mg total) by mouth daily with breakfast.   folic acid (FOLVITE) 1 MG tablet Take 1 tablet by mouth once daily   lactulose (CHRONULAC) 10 GM/15ML solution TAKE 15 MLS BY MOUTH 3 TIMES DAILY   lidocaine-prilocaine (EMLA) cream Apply 1 application topically as needed.  LORazepam (ATIVAN) 1 MG tablet Take 1 tablet (1 mg total) by mouth every 8 (eight) hours as needed for anxiety.   meclizine (ANTIVERT) 25 MG tablet Take 1 tablet (25 mg total) by mouth every 6 (six) hours as needed for dizziness.   Multiple Vitamin (MULTIVITAMIN WITH MINERALS) TABS tablet Take 1 tablet by mouth daily.   ondansetron (ZOFRAN) 8 MG tablet Take 1 tablet (8 mg total) by mouth every 8 (eight) hours as needed for nausea or vomiting. Starting day 3 after chemotherapy   oxyCODONE-acetaminophen (PERCOCET/ROXICET) 5-325 MG tablet Take 1 tablet by mouth every 6 (six) hours as needed for severe pain.   potassium chloride SA (KLOR-CON) 20 MEQ tablet Take 1 tablet (20 mEq total) by mouth daily.   predniSONE (DELTASONE) 10 MG tablet Take 1 tablet by mouth once daily  with breakfast   prochlorperazine (COMPAZINE) 10 MG tablet Take 1 tablet (10 mg total) by mouth every 6 (six) hours as needed.   [DISCONTINUED] sucralfate (CARAFATE) 1 g tablet Take 1 tablet (1 g total) by mouth 4 (four) times daily -  with meals and at bedtime. (Patient not taking: Reported on 03/05/2020)   Facility-Administered Encounter Medications as of 04/30/2021  Medication   0.9 %  sodium chloride infusion     Review of Systems n/a  Physical Exam  BP 118/68 (BP Location: Left Arm, Patient Position: Sitting, Cuff Size: Normal)    Pulse (!) 110    Temp 97.8 F (36.6 C) (Oral)    Ht 5' 10"  (1.778 m)    Wt 195 lb 12.8 oz (88.8 kg)    SpO2 98%    BMI 28.09 kg/m   Wt Readings from Last 5 Encounters:  04/30/21 195 lb 12.8 oz (88.8 kg)  04/22/21 196 lb 8 oz (89.1 kg)  04/01/21 199 lb 4.8 oz (90.4 kg)  03/11/21 199 lb 12.8 oz (90.6 kg)  02/18/21 197 lb 3.2 oz (89.4 kg)    BMI Readings from Last 5 Encounters:  04/30/21 28.09 kg/m  04/22/21 28.19 kg/m  04/01/21 28.60 kg/m  03/11/21 28.67 kg/m  02/18/21 28.30 kg/m     Physical Exam General: Sitting up in exam chair, in NAD Eyes: EOMI, no icterus Respiratory: bronchial breath sounds on inspiration throughout the right particularly mid to upper lung fields, left clear Cardiovascular: Borderline tachycardic, no murmurs Extremities: No edema, warm   Assessment & Plan:   Productive cough, CT/CXR infiltrate: Stop doxycycline about 3 months ago.  Cough worsened.  Resume doxycycline.  Cough note not improved.  Stop doxycycline once again.  Suspect related to tumor, mucus production.  Low suspicion for ongoing infection.  Would like to decrease prednisone dose slowly.  We will see how symptoms go with stopping doxycycline.  If symptoms stable, would advocate for slow taper 1 mg at time of prednisone.  Dyspnea exertion, weakness: In the setting of lung cancer with severe deconditioning due to therapy as well as severe  deconditioning due to prolonged pulmonary infection over the last several months requires ongoing treatment.  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 His weakness would be a risk to himself as well as other driver/computers on the road where he to return to work at this time.  Worsened in the late months of 2022.  Now with left-sided pleural effusion suspicious this may be contributing.  Left-sided pleural effusion: Possibly contributing to dyspnea.  Discussed thoracentesis.  Primary benefit would be to see if that helps  with his dyspnea.  He is contemplating this but not eager to move forward at this time.  Asked him to contact the office if at any point time he is ready to pursue this.  Left chest pain: Along left lateral ribs.  In the skin.  Not reducible on exam.  Describes neuropathic pain, shocklike.  Wonder if there was nerve damage from recent radiation of the left rib lesion.  Query if chemotherapy side effect given sometimes it does happen on the right but more often than dominantly on the left.  Do not think pleural effusion is contributing given description of symptoms.   Return in about 3 months (around 07/29/2021).   Lanier Clam, MD 04/30/2021

## 2021-05-03 ENCOUNTER — Inpatient Hospital Stay: Payer: BC Managed Care – PPO | Attending: Internal Medicine

## 2021-05-03 DIAGNOSIS — Z5112 Encounter for antineoplastic immunotherapy: Secondary | ICD-10-CM | POA: Insufficient documentation

## 2021-05-03 DIAGNOSIS — R1310 Dysphagia, unspecified: Secondary | ICD-10-CM | POA: Insufficient documentation

## 2021-05-03 DIAGNOSIS — C3481 Malignant neoplasm of overlapping sites of right bronchus and lung: Secondary | ICD-10-CM | POA: Insufficient documentation

## 2021-05-03 DIAGNOSIS — M2550 Pain in unspecified joint: Secondary | ICD-10-CM | POA: Insufficient documentation

## 2021-05-03 DIAGNOSIS — R091 Pleurisy: Secondary | ICD-10-CM | POA: Insufficient documentation

## 2021-05-03 DIAGNOSIS — R0609 Other forms of dyspnea: Secondary | ICD-10-CM | POA: Insufficient documentation

## 2021-05-03 DIAGNOSIS — C7931 Secondary malignant neoplasm of brain: Secondary | ICD-10-CM | POA: Insufficient documentation

## 2021-05-03 DIAGNOSIS — Z79899 Other long term (current) drug therapy: Secondary | ICD-10-CM | POA: Insufficient documentation

## 2021-05-03 DIAGNOSIS — Z5111 Encounter for antineoplastic chemotherapy: Secondary | ICD-10-CM | POA: Insufficient documentation

## 2021-05-03 DIAGNOSIS — R5383 Other fatigue: Secondary | ICD-10-CM | POA: Insufficient documentation

## 2021-05-04 ENCOUNTER — Ambulatory Visit
Admission: RE | Admit: 2021-05-04 | Discharge: 2021-05-04 | Disposition: A | Discharge status: Home / Self Care | Payer: BC Managed Care – PPO | Source: Ambulatory Visit | Attending: Radiation Oncology | Admitting: Radiation Oncology

## 2021-05-04 ENCOUNTER — Other Ambulatory Visit: Payer: Self-pay

## 2021-05-04 ENCOUNTER — Encounter: Payer: Self-pay | Admitting: Radiation Oncology

## 2021-05-04 VITALS — BP 112/72 | HR 99 | Temp 97.7°F | Resp 20 | Ht 70.0 in | Wt 195.8 lb

## 2021-05-04 DIAGNOSIS — C3412 Malignant neoplasm of upper lobe, left bronchus or lung: Secondary | ICD-10-CM | POA: Insufficient documentation

## 2021-05-04 DIAGNOSIS — R110 Nausea: Secondary | ICD-10-CM | POA: Insufficient documentation

## 2021-05-04 DIAGNOSIS — C7951 Secondary malignant neoplasm of bone: Secondary | ICD-10-CM | POA: Insufficient documentation

## 2021-05-04 DIAGNOSIS — Z923 Personal history of irradiation: Secondary | ICD-10-CM | POA: Diagnosis not present

## 2021-05-04 DIAGNOSIS — Z79899 Other long term (current) drug therapy: Secondary | ICD-10-CM | POA: Insufficient documentation

## 2021-05-04 DIAGNOSIS — C7931 Secondary malignant neoplasm of brain: Secondary | ICD-10-CM | POA: Diagnosis not present

## 2021-05-04 DIAGNOSIS — J479 Bronchiectasis, uncomplicated: Secondary | ICD-10-CM | POA: Insufficient documentation

## 2021-05-04 DIAGNOSIS — M5136 Other intervertebral disc degeneration, lumbar region: Secondary | ICD-10-CM | POA: Diagnosis not present

## 2021-05-04 DIAGNOSIS — J90 Pleural effusion, not elsewhere classified: Secondary | ICD-10-CM | POA: Insufficient documentation

## 2021-05-04 NOTE — Progress Notes (Signed)
Richard Li presents today for follow-up after completing radiation to brain [facility]) on 10/13/2020, and to review MRI results from 04/29/2021  Recent neurologic symptoms, if any:  Seizures: Patient denies Headaches: Patient denies Nausea: Reports occasional nausea and decrease in appetite. States prn nausea medication helps somewhat, but doesn't manage his symptoms completely Wt Readings from Last 3 Encounters:  05/04/21 195 lb 12.8 oz (88.8 kg)  04/30/21 195 lb 12.8 oz (88.8 kg)  04/22/21 196 lb 8 oz (89.1 kg)   Dizziness/ataxia: Patient denies Difficulty with hand coordination: Patient denies Focal numbness/weakness: Patient denies Visual deficits/changes: Patient denies Confusion/Memory deficits: Patient denies  Additional Complaints / other details: Continues to receive Pembrolizumab/Pemetrexed (last cycle 04/22/2021). Had F/U with pulmonologist Dr. Silas Flood on 04/30/2021. Reports persistent and significant fatigue (does not improve with a good nights rest or napping during the day). Otherwise denies any new issues or concerns

## 2021-05-04 NOTE — Progress Notes (Signed)
Radiation Oncology         [phone] ________________________________  Name: Richard Li MRN: [id number]  Date: 05/04/2021  DOB: 04-06-1961  Follow-Up Visit Note  outpatient  CC: Richard Nutting, DO  Richard Bears, MD  Diagnosis and Prior Radiotherapy:    ICD-10-CM   1. Brain metastases ([place])  C79.31       CHIEF COMPLAINT: Here for follow-up and surveillance of brain cancer  Narrative:  Richard Li presents today for follow-up after completing radiation to brain [facility]) on 10/13/2020, and to review MRI results from 04/29/2021  Recent neurologic symptoms, if any:  Seizures: Patient denies Headaches: Patient denies Nausea: Reports occasional nausea and decrease in appetite. States prn nausea medication helps somewhat, but doesn't manage his symptoms completely Wt Readings from Last 3 Encounters:  05/04/21 195 lb 12.8 oz (88.8 kg)  04/30/21 195 lb 12.8 oz (88.8 kg)  04/22/21 196 lb 8 oz (89.1 kg)   Dizziness/ataxia: Patient denies Difficulty with hand coordination: Patient denies Focal numbness/weakness: Patient denies Visual deficits/changes: Patient denies Confusion/Memory deficits: Patient denies  Additional Complaints / other details: Continues to receive Pembrolizumab/Pemetrexed (last cycle 04/22/2021). Had F/U with pulmonologist Richard Li on 04/30/2021. Reports persistent and significant fatigue (does not improve with a good nights rest or napping during the day). Otherwise denies any new issues or concerns Vitals:   05/04/21 1406  BP: 112/72  Pulse: 99  Resp: 20  Temp: 97.7 F (36.5 C)  SpO2: 100%                                 ALLERGIES:  is allergic to penicillins and amoxicillin.  Meds: Current Outpatient Medications  Medication Sig Dispense Refill   acetaminophen (TYLENOL) 500 MG tablet Take 1,000 mg by mouth 2 (two) times daily.     COMBIVENT RESPIMAT 20-100 MCG/ACT AERS respimat INHALE 1 TO 2 PUFFS BY MOUTH EVERY 6 HOURS AS NEEDED FOR  WHEEZING 4 g 5   dextromethorphan-guaiFENesin (MUCINEX DM) 30-600 MG 12hr tablet Take 1 tablet by mouth 2 (two) times daily.     doxycycline (VIBRA-TABS) 100 MG tablet Take 1 tablet by mouth twice daily 60 tablet 2   feeding supplement (ENSURE ENLIVE / ENSURE PLUS) LIQD Take 237 mLs by mouth 3 (three) times daily between meals. (Patient taking differently: Take 237 mLs by mouth daily as needed.) 237 mL 12   fenofibrate 160 MG tablet Take 1 tablet by mouth once daily (Patient taking differently: Take 160 mg by mouth daily.) 90 tablet 3   ferrous sulfate 325 (65 FE) MG tablet Take 1 tablet (325 mg total) by mouth daily with breakfast. 30 tablet 0   folic acid (FOLVITE) 1 MG tablet Take 1 tablet by mouth once daily 30 tablet 0   lactulose (CHRONULAC) 10 GM/15ML solution TAKE 15 MLS BY MOUTH 3 TIMES DAILY 236 mL 0   lidocaine-prilocaine (EMLA) cream Apply 1 application topically as needed. 30 g 2   LORazepam (ATIVAN) 1 MG tablet Take 1 tablet (1 mg total) by mouth every 8 (eight) hours as needed for anxiety. 90 tablet 5   meclizine (ANTIVERT) 25 MG tablet Take 1 tablet (25 mg total) by mouth every 6 (six) hours as needed for dizziness. 60 tablet 0   Multiple Vitamin (MULTIVITAMIN WITH MINERALS) TABS tablet Take 1 tablet by mouth daily.     ondansetron (ZOFRAN) 8 MG tablet Take 1 tablet (8 mg total) by  mouth every 8 (eight) hours as needed for nausea or vomiting. Starting day 3 after chemotherapy 30 tablet 2   oxyCODONE-acetaminophen (PERCOCET/ROXICET) 5-325 MG tablet Take 1 tablet by mouth every 6 (six) hours as needed for severe pain. 30 tablet 0   potassium chloride SA (KLOR-CON) 20 MEQ tablet Take 1 tablet (20 mEq total) by mouth daily. 5 tablet 0   predniSONE (DELTASONE) 10 MG tablet Take 1 tablet by mouth once daily with breakfast 90 tablet 0   prochlorperazine (COMPAZINE) 10 MG tablet Take 1 tablet (10 mg total) by mouth every 6 (six) hours as needed. 30 tablet 2   No current  facility-administered medications for this encounter.   Facility-Administered Medications Ordered in Other Encounters  Medication Dose Route Frequency Provider Last Rate Last Admin   0.9 %  sodium chloride infusion   Intravenous Continuous Richard Bears, MD   Stopped at 03/05/20 1410    Physical Findings: The patient is in no acute distress. Patient is alert and oriented.  height is $RemoveB'5\' 10"'amPhfYoZ$  (1.778 m) and weight is 195 lb 12.8 oz (88.8 kg). His temperature is 97.7 F (36.5 C). His blood pressure is 112/72 and his pulse is 99. His respiration is 20 and oxygen saturation is 100%. .    General he is in no acute distress, alert and oriented HEENT: Extraocular movements are intact.  No ptosis.  No oral thrush.  No facial droop Neuro: No gross focalities.  No tremor.  He is ambulatory; walks slowly.  Strength is symmetric. MSK:  symmetric muscle tone.  Ambulatory Heart RRR Chest CTAB  Lab Findings: Lab Results  Component Value Date   WBC 6.4 04/22/2021   HGB 10.4 (L) 04/22/2021   HCT 30.2 (L) 04/22/2021   MCV 96.5 04/22/2021   PLT 295 04/22/2021    Radiographic Findings: CT Chest W Contrast  Result Date: 04/21/2021 CLINICAL DATA:  Non-small cell lung cancer restaging. Prior chemotherapy and radiation therapy. T12 compression fracture with associated pain. EXAM: CT CHEST, ABDOMEN, AND PELVIS WITH CONTRAST TECHNIQUE: Multidetector CT imaging of the chest, abdomen and pelvis was performed following the standard protocol during bolus administration of intravenous contrast. CONTRAST:  12mL OMNIPAQUE IOHEXOL 350 MG/ML SOLN COMPARISON:  Multiple exams, including 02/15/2021 FINDINGS: CT CHEST FINDINGS Cardiovascular: Atherosclerotic calcification of the aortic arch. Right Port-A-Cath tip: Lower SVC. Narrowing of the lower SVC similar to previous. Small amount of pericardial fluid similar to prior. Mediastinum/Nodes: No pathologic adenopathy identified. Lungs/Pleura: Increased size of the moderate  left pleural effusion. Stable trace right pleural effusion. Similar appearance of consolidation and volume loss in the right upper lobe and right middle lobe with saccular gas density lesion directly connected to the right upper lobe bronchus with gas-filled component measuring about 4.6 by 3.1 cm on image 45 series 6 (formerly 5.0 by 3.6 cm) and a fusiform cavitary or saccular peribronchovascular gas collection along the consolidated anterior right upper lobe likewise slightly smaller than previous. Anterior right upper lobe bronchiectasis is also separately noted. There is a continued narrowed appearance of the right middle lobe bronchus centrally on image 71 of series 6. The bandlike and nodular lesion of the left upper lobe is less striking on today's exam, with the posterior nodular component measuring about 1.1 by 0.7 cm on image 86 series 6, formerly about 1.7 by 1.2 cm. Increased scattered reticulation in portions of the left upper lobe, with some confluent subpleural nodularity in the left upper lobe measuring about 1.0 by 0.7 cm on image  50 of series 6 which is new, and adjacent 5 by 3 mm subpleural nodule on image 55 series 6 which is new. There is also some new indistinct ground-glass density in the left upper lobe on image 65 series 6. This is most likely inflammatory but merit surveillance. The area of concern raised medially in the left upper lobe on the prior exam peers to demonstrate volume loss mostly bandlike opacity for example on image 57 of series 6, and appears less nodular and thickened compared to previous. Musculoskeletal: 40% superior endplate compression fracture at T12 with associated endplate sclerosis, and about 2 mm of posterior bony retropulsion, similar to the 02/15/2021 exam. Sclerosis in the left T9 pedicle and posterior vertebral body on image 96 series 5, similar to prior and also shown on PET-CT of 09/10/2020. Sclerotic T11 spinous process compatible with prior met metastatic  lesion. Previously hypermetabolic rib lesions are only faintly visible on today's exam. No current CT correlate for the previous hypermetabolic activity along the left inferior subscapularis muscle. CT ABDOMEN PELVIS FINDINGS Hepatobiliary: No discrete hepatic lesions are identified, with careful attention applied to the sites of prior hepatic metastatic lesions shown on the PET-CT of 09/10/2020. Gallbladder unremarkable. No biliary dilatation. Pancreas: Unremarkable Spleen: Unremarkable Adrenals/Urinary Tract: Both adrenal glands appear normal. Indistinctly marginated right kidney upper pole mass, with associated hypodensity measuring about 2.2 by 1.7 cm (previously 2.4 by 1.7 cm), previously hypermetabolic. Stomach/Bowel: Unremarkable Vascular/Lymphatic: Atherosclerosis is present, including aortoiliac atherosclerotic disease. Reproductive: Borderline prostatomegaly. Other: Stable trace presacral edema. Musculoskeletal: Mildly sclerotic lesions in the bony pelvis corresponding to previous metastatic disease. Mild sclerosis anteriorly in the left femoral head corresponding to a prior metastatic focus. No new metastatic lesions are identified. Lower lumbar degenerative disc disease. IMPRESSION: 1. The focus of bandlike density in nodularity which raise concern in the left upper lobe on the prior exam is slightly less prominent today, with reduced prominence of the nodular component. This has not completely resolved. 2. Similarly the region of nodularity and bandlike density medially in the left upper lobe raised as concerning on the prior exam is reduced in thickness but still not completely resolved. 3. There are some new indistinct ground-glass opacities, reticulation, and mild subpleural nodularity in the left upper lobe which has a pattern favoring inflammation but which merit surveillance. 4. Similar appearance of volume loss and consolidation with substantial saccular bronchiectasis and fusiform bronchiectasis  in the right upper lobe and to lesser degree in the right middle lobe. 5. Increase in the left pleural effusion, which is now moderate. 6. Similar manifestations of prior osseous metastatic disease, mostly sclerotic. 7. 40% superior endplate compression fracture at T12 is roughly stable in magnitude from 02/15/2021, and associated with sclerosis and about 2 mm of posterior bony retropulsion. 8. The previously hypermetabolic right kidney upper pole mass is similar to minimally reduced in size compared to the prior exam. 9. Other imaging findings of potential clinical significance: Aortic Atherosclerosis (ICD10-I70.0). Stable mild narrowing of the lower SVC. Lower lumbar degenerative disc disease. Electronically Signed   By: Van Clines M.D.   On: 04/21/2021 12:52   MR Brain W Wo Contrast  Result Date: 04/29/2021 CLINICAL DATA:  Brain/CNS neoplasm, monitor 3T SRS Protocol. Follow-up of treated brain. Metastasis to brain, monitor EXAM: MRI HEAD WITHOUT AND WITH CONTRAST TECHNIQUE: Multiplanar, multiecho pulse sequences of the brain and surrounding structures were obtained without and with intravenous contrast. CONTRAST:  87mL MULTIHANCE GADOBENATE DIMEGLUMINE 529 MG/ML IV SOLN COMPARISON:  01/15/2021 FINDINGS:  Brain: No acute infarct, mass effect or extra-axial collection. Focus of chronic microhemorrhage in the cerebellum, unchanged. Normal white matter signal, parenchymal volume and CSF spaces. The midline structures are normal. Unchanged 3 mm contrast enhancing lesion of the medial right cerebellum. No new contrast-enhancing lesions. Vascular: Major flow voids are preserved. Skull and upper cervical spine: Normal calvarium and skull base. Visualized upper cervical spine and soft tissues are normal. Sinuses/Orbits:No paranasal sinus fluid levels or advanced mucosal thickening. No mastoid or middle ear effusion. Normal orbits. 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 No new  contrast-enhancing lesions. Electronically Signed   By: Ulyses Jarred M.D.   On: 04/29/2021 13:29   CT Abdomen Pelvis W Contrast  Result Date: 04/21/2021 CLINICAL DATA:  Non-small cell lung cancer restaging. Prior chemotherapy and radiation therapy. T12 compression fracture with associated pain. EXAM: CT CHEST, ABDOMEN, AND PELVIS WITH CONTRAST TECHNIQUE: Multidetector CT imaging of the chest, abdomen and pelvis was performed following the standard protocol during bolus administration of intravenous contrast. CONTRAST:  20m OMNIPAQUE IOHEXOL 350 MG/ML SOLN COMPARISON:  Multiple exams, including 02/15/2021 FINDINGS: CT CHEST FINDINGS Cardiovascular: Atherosclerotic calcification of the aortic arch. Right Port-A-Cath tip: Lower SVC. Narrowing of the lower SVC similar to previous. Small amount of pericardial fluid similar to prior. Mediastinum/Nodes: No pathologic adenopathy identified. Lungs/Pleura: Increased size of the moderate left pleural effusion. Stable trace right pleural effusion. Similar appearance of consolidation and volume loss in the right upper lobe and right middle lobe with saccular gas density lesion directly connected to the right upper lobe bronchus with gas-filled component measuring about 4.6 by 3.1 cm on image 45 series 6 (formerly 5.0 by 3.6 cm) and a fusiform cavitary or saccular peribronchovascular gas collection along the consolidated anterior right upper lobe likewise slightly smaller than previous. Anterior right upper lobe bronchiectasis is also separately noted. There is a continued narrowed appearance of the right middle lobe bronchus centrally on image 71 of series 6. The bandlike and nodular lesion of the left upper lobe is less striking on today's exam, with the posterior nodular component measuring about 1.1 by 0.7 cm on image 86 series 6, formerly about 1.7 by 1.2 cm. Increased scattered reticulation in portions of the left upper lobe, with some confluent subpleural nodularity  in the left upper lobe measuring about 1.0 by 0.7 cm on image 50 of series 6 which is new, and adjacent 5 by 3 mm subpleural nodule on image 55 series 6 which is new. There is also some new indistinct ground-glass density in the left upper lobe on image 65 series 6. This is most likely inflammatory but merit surveillance. The area of concern raised medially in the left upper lobe on the prior exam peers to demonstrate volume loss mostly bandlike opacity for example on image 57 of series 6, and appears less nodular and thickened compared to previous. Musculoskeletal: 40% superior endplate compression fracture at T12 with associated endplate sclerosis, and about 2 mm of posterior bony retropulsion, similar to the 02/15/2021 exam. Sclerosis in the left T9 pedicle and posterior vertebral body on image 96 series 5, similar to prior and also shown on PET-CT of 09/10/2020. Sclerotic T11 spinous process compatible with prior met metastatic lesion. Previously hypermetabolic rib lesions are only faintly visible on today's exam. No current CT correlate for the previous hypermetabolic activity along the left inferior subscapularis muscle. CT ABDOMEN PELVIS FINDINGS Hepatobiliary: No discrete hepatic lesions are identified, with careful attention applied to the sites of  prior hepatic metastatic lesions shown on the PET-CT of 09/10/2020. Gallbladder unremarkable. No biliary dilatation. Pancreas: Unremarkable Spleen: Unremarkable Adrenals/Urinary Tract: Both adrenal glands appear normal. Indistinctly marginated right kidney upper pole mass, with associated hypodensity measuring about 2.2 by 1.7 cm (previously 2.4 by 1.7 cm), previously hypermetabolic. Stomach/Bowel: Unremarkable Vascular/Lymphatic: Atherosclerosis is present, including aortoiliac atherosclerotic disease. Reproductive: Borderline prostatomegaly. Other: Stable trace presacral edema. Musculoskeletal: Mildly sclerotic lesions in the bony pelvis corresponding to  previous metastatic disease. Mild sclerosis anteriorly in the left femoral head corresponding to a prior metastatic focus. No new metastatic lesions are identified. Lower lumbar degenerative disc disease. IMPRESSION: 1. The focus of bandlike density in nodularity which raise concern in the left upper lobe on the prior exam is slightly less prominent today, with reduced prominence of the nodular component. This has not completely resolved. 2. Similarly the region of nodularity and bandlike density medially in the left upper lobe raised as concerning on the prior exam is reduced in thickness but still not completely resolved. 3. There are some new indistinct ground-glass opacities, reticulation, and mild subpleural nodularity in the left upper lobe which has a pattern favoring inflammation but which merit surveillance. 4. Similar appearance of volume loss and consolidation with substantial saccular bronchiectasis and fusiform bronchiectasis in the right upper lobe and to lesser degree in the right middle lobe. 5. Increase in the left pleural effusion, which is now moderate. 6. Similar manifestations of prior osseous metastatic disease, mostly sclerotic. 7. 40% superior endplate compression fracture at T12 is roughly stable in magnitude from 02/15/2021, and associated with sclerosis and about 2 mm of posterior bony retropulsion. 8. The previously hypermetabolic right kidney upper pole mass is similar to minimally reduced in size compared to the prior exam. 9. Other imaging findings of potential clinical significance: Aortic Atherosclerosis (ICD10-I70.0). Stable mild narrowing of the lower SVC. Lower lumbar degenerative disc disease. Electronically Signed   By: Van Clines M.D.   On: 04/21/2021 12:52    Impression/Plan:  Doing well, no evidence of progressive or active disease in brain.  We will arrange an MRI of his brain in 3 months and he will see neurosurgery to review those results.  I will see him back  for the subsequent MRI.  He is pleased with this plan we will continue to follow closely with medical oncology as he receives systemic therapy.  On date of service, in total, I spent 20 minutes on this encounter. Patient was seen in person.  _____________________________________   Eppie Gibson, MD

## 2021-05-06 ENCOUNTER — Telehealth: Payer: Self-pay

## 2021-05-06 NOTE — Telephone Encounter (Signed)
Notified Patient of completion of Disability Attending Physician's Statement -Progress Report. Fax transmission confirmation received from [employer]. Copy of transmission confirmation and completed form mailed to Patient as requested

## 2021-05-13 ENCOUNTER — Inpatient Hospital Stay: Payer: BC Managed Care – PPO

## 2021-05-13 ENCOUNTER — Other Ambulatory Visit: Payer: Self-pay | Admitting: Physician Assistant

## 2021-05-13 ENCOUNTER — Other Ambulatory Visit: Payer: Self-pay

## 2021-05-13 ENCOUNTER — Inpatient Hospital Stay (HOSPITAL_BASED_OUTPATIENT_CLINIC_OR_DEPARTMENT_OTHER): Payer: BC Managed Care – PPO | Admitting: Internal Medicine

## 2021-05-13 ENCOUNTER — Encounter: Payer: Self-pay | Admitting: Licensed Clinical Social Worker

## 2021-05-13 VITALS — HR 99

## 2021-05-13 DIAGNOSIS — C3491 Malignant neoplasm of unspecified part of right bronchus or lung: Secondary | ICD-10-CM | POA: Diagnosis not present

## 2021-05-13 DIAGNOSIS — Z5111 Encounter for antineoplastic chemotherapy: Secondary | ICD-10-CM | POA: Diagnosis present

## 2021-05-13 DIAGNOSIS — R1310 Dysphagia, unspecified: Secondary | ICD-10-CM | POA: Diagnosis not present

## 2021-05-13 DIAGNOSIS — D649 Anemia, unspecified: Secondary | ICD-10-CM

## 2021-05-13 DIAGNOSIS — M2550 Pain in unspecified joint: Secondary | ICD-10-CM | POA: Diagnosis not present

## 2021-05-13 DIAGNOSIS — R5383 Other fatigue: Secondary | ICD-10-CM | POA: Diagnosis not present

## 2021-05-13 DIAGNOSIS — C3481 Malignant neoplasm of overlapping sites of right bronchus and lung: Secondary | ICD-10-CM | POA: Diagnosis not present

## 2021-05-13 DIAGNOSIS — R0609 Other forms of dyspnea: Secondary | ICD-10-CM | POA: Diagnosis not present

## 2021-05-13 DIAGNOSIS — C7931 Secondary malignant neoplasm of brain: Secondary | ICD-10-CM | POA: Diagnosis not present

## 2021-05-13 DIAGNOSIS — E860 Dehydration: Secondary | ICD-10-CM

## 2021-05-13 DIAGNOSIS — R091 Pleurisy: Secondary | ICD-10-CM | POA: Diagnosis not present

## 2021-05-13 DIAGNOSIS — E876 Hypokalemia: Secondary | ICD-10-CM

## 2021-05-13 DIAGNOSIS — Z5112 Encounter for antineoplastic immunotherapy: Secondary | ICD-10-CM | POA: Diagnosis present

## 2021-05-13 DIAGNOSIS — Z79899 Other long term (current) drug therapy: Secondary | ICD-10-CM | POA: Diagnosis not present

## 2021-05-13 LAB — CMP (CANCER CENTER ONLY)
ALT: 9 U/L (ref 0–44)
AST: 21 U/L (ref 15–41)
Albumin: 3.3 g/dL — ABNORMAL LOW (ref 3.5–5.0)
Alkaline Phosphatase: 88 U/L (ref 38–126)
Anion gap: 7 (ref 5–15)
BUN: 8 mg/dL (ref 6–20)
CO2: 27 mmol/L (ref 22–32)
Calcium: 9 mg/dL (ref 8.9–10.3)
Chloride: 107 mmol/L (ref 98–111)
Creatinine: 0.94 mg/dL (ref 0.61–1.24)
GFR, Estimated: >60 mL/min (ref >60)
Glucose, Bld: 114 mg/dL — ABNORMAL HIGH (ref 70–99)
Potassium: 3 mmol/L — ABNORMAL LOW (ref 3.5–5.1)
Sodium: 141 mmol/L (ref 135–145)
Total Bilirubin: 0.5 mg/dL (ref 0.3–1.2)
Total Protein: 6.5 g/dL (ref 6.5–8.1)

## 2021-05-13 LAB — CBC WITH DIFFERENTIAL (CANCER CENTER ONLY)
Abs Immature Granulocytes: 0.02 10*3/uL (ref 0.00–0.07)
Basophils Absolute: 0 10*3/uL (ref 0.0–0.1)
Basophils Relative: 1 %
Eosinophils Absolute: 0.1 10*3/uL (ref 0.0–0.5)
Eosinophils Relative: 2 %
HCT: 30.8 % — ABNORMAL LOW (ref 39.0–52.0)
Hemoglobin: 10.4 g/dL — ABNORMAL LOW (ref 13.0–17.0)
Immature Granulocytes: 0 %
Lymphocytes Relative: 8 %
Lymphs Abs: 0.5 10*3/uL — ABNORMAL LOW (ref 0.7–4.0)
MCH: 32.8 pg (ref 26.0–34.0)
MCHC: 33.8 g/dL (ref 30.0–36.0)
MCV: 97.2 fL (ref 80.0–100.0)
Monocytes Absolute: 0.8 10*3/uL (ref 0.1–1.0)
Monocytes Relative: 12 %
Neutro Abs: 5.1 10*3/uL (ref 1.7–7.7)
Neutrophils Relative %: 77 %
Platelet Count: 304 10*3/uL (ref 150–400)
RBC: 3.17 MIL/uL — ABNORMAL LOW (ref 4.22–5.81)
RDW: 15.2 % (ref 11.5–15.5)
WBC Count: 6.6 10*3/uL (ref 4.0–10.5)
nRBC: 0 % (ref 0.0–0.2)

## 2021-05-13 LAB — TSH: TSH: 1.303 u[IU]/mL (ref 0.320–4.118)

## 2021-05-13 MED ORDER — SODIUM CHLORIDE 0.9 % IV SOLN
500.0000 mg/m2 | Freq: Once | INTRAVENOUS | Status: AC
  Administered 2021-05-13: 1000 mg via INTRAVENOUS
  Filled 2021-05-13: qty 40

## 2021-05-13 MED ORDER — SODIUM CHLORIDE 0.9 % IV SOLN: Freq: Once | INTRAVENOUS | Status: AC

## 2021-05-13 MED ORDER — CYANOCOBALAMIN 1000 MCG/ML IJ SOLN
1000.0000 ug | Freq: Once | INTRAMUSCULAR | Status: AC
  Administered 2021-05-13: 1000 ug via INTRAMUSCULAR
  Filled 2021-05-13: qty 1

## 2021-05-13 MED ORDER — OXYCODONE-ACETAMINOPHEN 5-325 MG PO TABS: 1.0000 | ORAL_TABLET | Freq: Four times a day (QID) | ORAL | 0 refills | Status: DC | PRN

## 2021-05-13 MED ORDER — SODIUM CHLORIDE 0.9% FLUSH
10.0000 mL | Freq: Once | INTRAVENOUS | Status: AC | PRN
  Administered 2021-05-13: 10 mL

## 2021-05-13 MED ORDER — SODIUM CHLORIDE 0.9 % IV SOLN
200.0000 mg | Freq: Once | INTRAVENOUS | Status: AC
  Administered 2021-05-13: 200 mg via INTRAVENOUS
  Filled 2021-05-13: qty 200

## 2021-05-13 MED ORDER — HEPARIN SOD (PORK) LOCK FLUSH 100 UNIT/ML IV SOLN
500.0000 [IU] | Freq: Once | INTRAVENOUS | Status: AC | PRN
  Administered 2021-05-13: 500 [IU]

## 2021-05-13 MED ORDER — SODIUM CHLORIDE 0.9% FLUSH
10.0000 mL | INTRAVENOUS | Status: DC | PRN
  Administered 2021-05-13: 10 mL

## 2021-05-13 MED ORDER — POTASSIUM CHLORIDE CRYS ER 20 MEQ PO TBCR: 20.0000 meq | EXTENDED_RELEASE_TABLET | Freq: Every day | ORAL | 0 refills | Status: DC

## 2021-05-13 MED ORDER — PROCHLORPERAZINE MALEATE 10 MG PO TABS
10.0000 mg | ORAL_TABLET | Freq: Once | ORAL | Status: AC
  Administered 2021-05-13: 10 mg via ORAL
  Filled 2021-05-13: qty 1

## 2021-05-13 NOTE — Progress Notes (Signed)
[facility] CSW Progress Note  Clinical Education officer, museum  received TC from patient  asking about [employer]. Unfortunately pt has used all of his funds. CSW provided information about [employer] gas cards and sent application to patient.  Patient inquired about what other support programs are available. CSW reviewed various groups and programs and sent monthly calendar of events to pt.    Christeen Douglas , LCSW

## 2021-05-13 NOTE — Progress Notes (Signed)
Called the patient to let him know I am sending in potassium supplement for a few days due to hypokalemia. He expressed understanding.

## 2021-05-13 NOTE — Progress Notes (Signed)
[facility] [phone]   [phone]  OFFICE PROGRESS NOTE  [name], DO [place]  [address] [place] [address]  DIAGNOSIS: Metastatic non-small cell lung cancer initially diagnosed as stage IIB (T3, N0, M0) non-small cell lung cancer, adenocarcinoma presented with large central perihilar mass with suspicious groundglass opacity in the right middle lobe and left upper lobe diagnosed in September 2021.  The patient had evidence of metastatic disease to the right kidney in March 2022.  Molecular studies by guardant 360: No actionable mutations.   PDl1: 80%   PRIOR THERAPY: Concurrent chemoradiation with weekly carboplatin for AUC of 2 and paclitaxel 45 mg/M2.  First dose February 10, 2020.  Status post 5 cycles.   CURRENT THERAPY:  1) Palliative systemic chemotherapy with carboplatin AUC of 5, Alimta 500 mg/M2, Keytruda 200 mg IV every 3 weeks.  First dose expected on 09/24/2020. Status post 11 cycles.  Starting from cycle #5 the patient is on maintenance treatment with Alimta and Keytruda every 3 weeks. 2) Palliative radiation to the left anterior rib cage area as well as the mid back area under the care of Dr. Sondra Come. Last treatment on 10/05/20.  INTERVAL HISTORY: Richard Li 61 y.o. male returns to the clinic today for follow-up visit.  The patient is feeling fine today with no concerning complaints except for occasional pain on the left side of the chest.  He also has shortness of breath with exertion.  He is followed by Dr. Silas Flood for his pulmonary issues.  He denied having any cough or hemoptysis.  He denied having any fever or chills.  He has no nausea, vomiting, diarrhea or constipation.  He has no headache or visual changes.  He has no significant weight loss or night sweats.  He is here today for evaluation before starting cycle #12 of his treatment.   MEDICAL HISTORY: Past Medical History:  Diagnosis Date    Allergic rhinitis, cause unspecified    Anxiety state, unspecified    Asthma    as a child   Chronic airway obstruction, not elsewhere classified    Esophageal reflux    History of kidney stones    History of radiation therapy 02/13/2020-03/24/2020   right lung        Dr Gery Pray   Hypertension    Lumbago    lung ca dx'd 12/2019   Other and unspecified hyperlipidemia    Other chest pain     ALLERGIES:  is allergic to penicillins and amoxicillin.  MEDICATIONS:  Current Outpatient Medications  Medication Sig Dispense Refill   acetaminophen (TYLENOL) 500 MG tablet Take 1,000 mg by mouth 2 (two) times daily.     COMBIVENT RESPIMAT 20-100 MCG/ACT AERS respimat INHALE 1 TO 2 PUFFS BY MOUTH EVERY 6 HOURS AS NEEDED FOR WHEEZING 4 g 5   dextromethorphan-guaiFENesin (MUCINEX DM) 30-600 MG 12hr tablet Take 1 tablet by mouth 2 (two) times daily.     feeding supplement (ENSURE ENLIVE / ENSURE PLUS) LIQD Take 237 mLs by mouth 3 (three) times daily between meals. (Patient taking differently: Take 237 mLs by mouth daily as needed.) 237 mL 12   fenofibrate 160 MG tablet Take 1 tablet by mouth once daily (Patient taking differently: Take 160 mg by mouth daily.) 90 tablet 3   ferrous sulfate 325 (65 FE) MG tablet Take 1 tablet (325 mg total) by mouth daily with breakfast. 30 tablet 0  folic acid (FOLVITE) 1 MG tablet Take 1 tablet by mouth once daily 30 tablet 0   lactulose (CHRONULAC) 10 GM/15ML solution TAKE 15 MLS BY MOUTH 3 TIMES DAILY 236 mL 0   lidocaine-prilocaine (EMLA) cream Apply 1 application topically as needed. 30 g 2   LORazepam (ATIVAN) 1 MG tablet Take 1 tablet (1 mg total) by mouth every 8 (eight) hours as needed for anxiety. 90 tablet 5   meclizine (ANTIVERT) 25 MG tablet Take 1 tablet (25 mg total) by mouth every 6 (six) hours as needed for dizziness. 60 tablet 0   Multiple Vitamin (MULTIVITAMIN WITH MINERALS) TABS tablet Take 1 tablet by mouth daily.     ondansetron (ZOFRAN) 8  MG tablet Take 1 tablet (8 mg total) by mouth every 8 (eight) hours as needed for nausea or vomiting. Starting day 3 after chemotherapy 30 tablet 2   oxyCODONE-acetaminophen (PERCOCET/ROXICET) 5-325 MG tablet Take 1 tablet by mouth every 6 (six) hours as needed for severe pain. 30 tablet 0   potassium chloride SA (KLOR-CON) 20 MEQ tablet Take 1 tablet (20 mEq total) by mouth daily. 5 tablet 0   predniSONE (DELTASONE) 10 MG tablet Take 1 tablet by mouth once daily with breakfast 90 tablet 0   prochlorperazine (COMPAZINE) 10 MG tablet Take 1 tablet (10 mg total) by mouth every 6 (six) hours as needed. 30 tablet 2   No current facility-administered medications for this visit.   Facility-Administered Medications Ordered in Other Visits  Medication Dose Route Frequency Provider Last Rate Last Admin   0.9 %  sodium chloride infusion   Intravenous Continuous Curt Bears, MD   Stopped at 03/05/20 1410    SURGICAL HISTORY:  Past Surgical History:  Procedure Laterality Date   APPENDECTOMY     BRONCHIAL BIOPSY  01/07/2020   Procedure: BRONCHIAL BIOPSIES;  Surgeon: Collene Gobble, MD;  Location: [facility] ENDOSCOPY;  Service: Pulmonary;;   BRONCHIAL BIOPSY  04/05/2020   Procedure: BRONCHIAL BIOPSIES;  Surgeon: Rigoberto Noel, MD;  Location: [facility];  Service: Cardiopulmonary;;   BRONCHIAL BRUSHINGS  01/07/2020   Procedure: BRONCHIAL BRUSHINGS;  Surgeon: Collene Gobble, MD;  Location: [facility] ENDOSCOPY;  Service: Pulmonary;;   BRONCHIAL NEEDLE ASPIRATION BIOPSY  01/07/2020   Procedure: BRONCHIAL NEEDLE ASPIRATION BIOPSIES;  Surgeon: Collene Gobble, MD;  Location: [facility] ENDOSCOPY;  Service: Pulmonary;;   BRONCHIAL WASHINGS  01/07/2020   Procedure: BRONCHIAL WASHINGS;  Surgeon: Collene Gobble, MD;  Location: [facility] ENDOSCOPY;  Service: Pulmonary;;   BRONCHIAL WASHINGS  04/05/2020   Procedure: BRONCHIAL WASHINGS;  Surgeon: Rigoberto Noel, MD;  Location: [facility];  Service: Cardiopulmonary;;   COLONOSCOPY  15  years ago   HEMOSTASIS CONTROL  01/07/2020   Procedure: HEMOSTASIS CONTROL;  Surgeon: Collene Gobble, MD;  Location: [facility] ENDOSCOPY;  Service: Pulmonary;;  cold saline   IR IMAGING GUIDED PORT INSERTION  09/18/2020   NASAL TURBINATE REDUCTION  2002   Dr.Crossley   VASECTOMY     VIDEO BRONCHOSCOPY Right 04/05/2020   Procedure: VIDEO BRONCHOSCOPY WITH FLUORO;  Surgeon: Rigoberto Noel, MD;  Location: [facility];  Service: Cardiopulmonary;  Laterality: Right;   VIDEO BRONCHOSCOPY WITH ENDOBRONCHIAL NAVIGATION N/A 01/07/2020   Procedure: VIDEO BRONCHOSCOPY WITH ENDOBRONCHIAL NAVIGATION;  Surgeon: Collene Gobble, MD;  Location: [facility] ENDOSCOPY;  Service: Pulmonary;  Laterality: N/A;   VIDEO BRONCHOSCOPY WITH ENDOBRONCHIAL ULTRASOUND N/A 01/07/2020   Procedure: VIDEO BRONCHOSCOPY WITH ENDOBRONCHIAL ULTRASOUND;  Surgeon: Collene Gobble, MD;  Location: [facility]  ENDOSCOPY;  Service: Pulmonary;  Laterality: N/A;    REVIEW OF SYSTEMS:  A comprehensive review of systems was negative except for: Constitutional: positive for fatigue Respiratory: positive for dyspnea on exertion and pleurisy/chest pain Musculoskeletal: positive for arthralgias   PHYSICAL EXAMINATION: General appearance: alert, cooperative, fatigued, and no distress Head: Normocephalic, without obvious abnormality, atraumatic Neck: no adenopathy, no JVD, supple, symmetrical, trachea midline, and thyroid not enlarged, symmetric, no tenderness/mass/nodules Lymph nodes: Cervical, supraclavicular, and axillary nodes normal. Resp: diminished breath sounds LLL and dullness to percussion LLL Back: symmetric, no curvature. ROM normal. No CVA tenderness. Cardio: regular rate and rhythm, S1, S2 normal, no murmur, click, rub or gallop GI: soft, non-tender; bowel sounds normal; no masses,  no organomegaly Extremities: extremities normal, atraumatic, no cyanosis or edema  ECOG PERFORMANCE STATUS: 1 - Symptomatic but completely ambulatory  Blood pressure (!)  141/78, pulse (!) 104, temperature (!) 97.5 F (36.4 C), temperature source Tympanic, resp. rate 17, height 5' 10" (1.778 m), weight 197 lb 12.8 oz (89.7 kg), SpO2 100 %.  LABORATORY DATA: Lab Results  Component Value Date   WBC 6.6 05/13/2021   HGB 10.4 (L) 05/13/2021   HCT 30.8 (L) 05/13/2021   MCV 97.2 05/13/2021   PLT 304 05/13/2021      Chemistry      Component Value Date/Time   NA 139 04/22/2021 1019   K 3.3 (L) 04/22/2021 1019   CL 106 04/22/2021 1019   CO2 27 04/22/2021 1019   BUN 10 04/22/2021 1019   CREATININE 0.93 04/22/2021 1019   CREATININE 0.67 (L) 06/24/2020 1517      Component Value Date/Time   CALCIUM 9.0 04/22/2021 1019   ALKPHOS 79 04/22/2021 1019   AST 21 04/22/2021 1019   ALT 10 04/22/2021 1019   BILITOT 0.5 04/22/2021 1019       RADIOGRAPHIC STUDIES: CT Chest W Contrast  Result Date: 04/21/2021 CLINICAL DATA:  Non-small cell lung cancer restaging. Prior chemotherapy and radiation therapy. T12 compression fracture with associated pain. EXAM: CT CHEST, ABDOMEN, AND PELVIS WITH CONTRAST TECHNIQUE: Multidetector CT imaging of the chest, abdomen and pelvis was performed following the standard protocol during bolus administration of intravenous contrast. CONTRAST:  55m OMNIPAQUE IOHEXOL 350 MG/ML SOLN COMPARISON:  Multiple exams, including 02/15/2021 FINDINGS: CT CHEST FINDINGS Cardiovascular: Atherosclerotic calcification of the aortic arch. Right Port-A-Cath tip: Lower SVC. Narrowing of the lower SVC similar to previous. Small amount of pericardial fluid similar to prior. Mediastinum/Nodes: No pathologic adenopathy identified. Lungs/Pleura: Increased size of the moderate left pleural effusion. Stable trace right pleural effusion. Similar appearance of consolidation and volume loss in the right upper lobe and right middle lobe with saccular gas density lesion directly connected to the right upper lobe bronchus with gas-filled component measuring about 4.6 by  3.1 cm on image 45 series 6 (formerly 5.0 by 3.6 cm) and a fusiform cavitary or saccular peribronchovascular gas collection along the consolidated anterior right upper lobe likewise slightly smaller than previous. Anterior right upper lobe bronchiectasis is also separately noted. There is a continued narrowed appearance of the right middle lobe bronchus centrally on image 71 of series 6. The bandlike and nodular lesion of the left upper lobe is less striking on today's exam, with the posterior nodular component measuring about 1.1 by 0.7 cm on image 86 series 6, formerly about 1.7 by 1.2 cm. Increased scattered reticulation in portions of the left upper lobe, with some confluent subpleural nodularity in the left upper lobe measuring about  1.0 by 0.7 cm on image 50 of series 6 which is new, and adjacent 5 by 3 mm subpleural nodule on image 55 series 6 which is new. There is also some new indistinct ground-glass density in the left upper lobe on image 65 series 6. This is most likely inflammatory but merit surveillance. The area of concern raised medially in the left upper lobe on the prior exam peers to demonstrate volume loss mostly bandlike opacity for example on image 57 of series 6, and appears less nodular and thickened compared to previous. Musculoskeletal: 40% superior endplate compression fracture at T12 with associated endplate sclerosis, and about 2 mm of posterior bony retropulsion, similar to the 02/15/2021 exam. Sclerosis in the left T9 pedicle and posterior vertebral body on image 96 series 5, similar to prior and also shown on PET-CT of 09/10/2020. Sclerotic T11 spinous process compatible with prior met metastatic lesion. Previously hypermetabolic rib lesions are only faintly visible on today's exam. No current CT correlate for the previous hypermetabolic activity along the left inferior subscapularis muscle. CT ABDOMEN PELVIS FINDINGS Hepatobiliary: No discrete hepatic lesions are identified, with  careful attention applied to the sites of prior hepatic metastatic lesions shown on the PET-CT of 09/10/2020. Gallbladder unremarkable. No biliary dilatation. Pancreas: Unremarkable Spleen: Unremarkable Adrenals/Urinary Tract: Both adrenal glands appear normal. Indistinctly marginated right kidney upper pole mass, with associated hypodensity measuring about 2.2 by 1.7 cm (previously 2.4 by 1.7 cm), previously hypermetabolic. Stomach/Bowel: Unremarkable Vascular/Lymphatic: Atherosclerosis is present, including aortoiliac atherosclerotic disease. Reproductive: Borderline prostatomegaly. Other: Stable trace presacral edema. Musculoskeletal: Mildly sclerotic lesions in the bony pelvis corresponding to previous metastatic disease. Mild sclerosis anteriorly in the left femoral head corresponding to a prior metastatic focus. No new metastatic lesions are identified. Lower lumbar degenerative disc disease. IMPRESSION: 1. The focus of bandlike density in nodularity which raise concern in the left upper lobe on the prior exam is slightly less prominent today, with reduced prominence of the nodular component. This has not completely resolved. 2. Similarly the region of nodularity and bandlike density medially in the left upper lobe raised as concerning on the prior exam is reduced in thickness but still not completely resolved. 3. There are some new indistinct ground-glass opacities, reticulation, and mild subpleural nodularity in the left upper lobe which has a pattern favoring inflammation but which merit surveillance. 4. Similar appearance of volume loss and consolidation with substantial saccular bronchiectasis and fusiform bronchiectasis in the right upper lobe and to lesser degree in the right middle lobe. 5. Increase in the left pleural effusion, which is now moderate. 6. Similar manifestations of prior osseous metastatic disease, mostly sclerotic. 7. 40% superior endplate compression fracture at T12 is roughly stable  in magnitude from 02/15/2021, and associated with sclerosis and about 2 mm of posterior bony retropulsion. 8. The previously hypermetabolic right kidney upper pole mass is similar to minimally reduced in size compared to the prior exam. 9. Other imaging findings of potential clinical significance: Aortic Atherosclerosis (ICD10-I70.0). Stable mild narrowing of the lower SVC. Lower lumbar degenerative disc disease. Electronically Signed   By: Van Clines M.D.   On: 04/21/2021 12:52   MR Brain W Wo Contrast  Result Date: 04/29/2021 CLINICAL DATA:  Brain/CNS neoplasm, monitor 3T SRS Protocol. Follow-up of treated brain. Metastasis to brain, monitor EXAM: MRI HEAD WITHOUT AND WITH CONTRAST TECHNIQUE: Multiplanar, multiecho pulse sequences of the brain and surrounding structures were obtained without and with intravenous contrast. CONTRAST:  16m MULTIHANCE GADOBENATE DIMEGLUMINE 529 MG/ML  IV SOLN COMPARISON:  01/15/2021 FINDINGS: Brain: No acute infarct, mass effect or extra-axial collection. Focus of chronic microhemorrhage in the cerebellum, unchanged. Normal white matter signal, parenchymal volume and CSF spaces. The midline structures are normal. Unchanged 3 mm contrast enhancing lesion of the medial right cerebellum. No new contrast-enhancing lesions. Vascular: Major flow voids are preserved. Skull and upper cervical spine: Normal calvarium and skull base. Visualized upper cervical spine and soft tissues are normal. Sinuses/Orbits:No paranasal sinus fluid levels or advanced mucosal thickening. No mastoid or middle ear effusion. Normal orbits. 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 No new contrast-enhancing lesions. Electronically Signed   By: Ulyses Jarred M.D.   On: 04/29/2021 13:29   CT Abdomen Pelvis W Contrast  Result Date: 04/21/2021 CLINICAL DATA:  Non-small cell lung cancer restaging. Prior chemotherapy and radiation therapy. T12 compression fracture with  associated pain. EXAM: CT CHEST, ABDOMEN, AND PELVIS WITH CONTRAST TECHNIQUE: Multidetector CT imaging of the chest, abdomen and pelvis was performed following the standard protocol during bolus administration of intravenous contrast. CONTRAST:  2m OMNIPAQUE IOHEXOL 350 MG/ML SOLN COMPARISON:  Multiple exams, including 02/15/2021 FINDINGS: CT CHEST FINDINGS Cardiovascular: Atherosclerotic calcification of the aortic arch. Right Port-A-Cath tip: Lower SVC. Narrowing of the lower SVC similar to previous. Small amount of pericardial fluid similar to prior. Mediastinum/Nodes: No pathologic adenopathy identified. Lungs/Pleura: Increased size of the moderate left pleural effusion. Stable trace right pleural effusion. Similar appearance of consolidation and volume loss in the right upper lobe and right middle lobe with saccular gas density lesion directly connected to the right upper lobe bronchus with gas-filled component measuring about 4.6 by 3.1 cm on image 45 series 6 (formerly 5.0 by 3.6 cm) and a fusiform cavitary or saccular peribronchovascular gas collection along the consolidated anterior right upper lobe likewise slightly smaller than previous. Anterior right upper lobe bronchiectasis is also separately noted. There is a continued narrowed appearance of the right middle lobe bronchus centrally on image 71 of series 6. The bandlike and nodular lesion of the left upper lobe is less striking on today's exam, with the posterior nodular component measuring about 1.1 by 0.7 cm on image 86 series 6, formerly about 1.7 by 1.2 cm. Increased scattered reticulation in portions of the left upper lobe, with some confluent subpleural nodularity in the left upper lobe measuring about 1.0 by 0.7 cm on image 50 of series 6 which is new, and adjacent 5 by 3 mm subpleural nodule on image 55 series 6 which is new. There is also some new indistinct ground-glass density in the left upper lobe on image 65 series 6. This is most  likely inflammatory but merit surveillance. The area of concern raised medially in the left upper lobe on the prior exam peers to demonstrate volume loss mostly bandlike opacity for example on image 57 of series 6, and appears less nodular and thickened compared to previous. Musculoskeletal: 40% superior endplate compression fracture at T12 with associated endplate sclerosis, and about 2 mm of posterior bony retropulsion, similar to the 02/15/2021 exam. Sclerosis in the left T9 pedicle and posterior vertebral body on image 96 series 5, similar to prior and also shown on PET-CT of 09/10/2020. Sclerotic T11 spinous process compatible with prior met metastatic lesion. Previously hypermetabolic rib lesions are only faintly visible on today's exam. No current CT correlate for the previous hypermetabolic activity along the left inferior subscapularis muscle. CT ABDOMEN PELVIS FINDINGS Hepatobiliary: No discrete hepatic lesions are identified, with careful  attention applied to the sites of prior hepatic metastatic lesions shown on the PET-CT of 09/10/2020. Gallbladder unremarkable. No biliary dilatation. Pancreas: Unremarkable Spleen: Unremarkable Adrenals/Urinary Tract: Both adrenal glands appear normal. Indistinctly marginated right kidney upper pole mass, with associated hypodensity measuring about 2.2 by 1.7 cm (previously 2.4 by 1.7 cm), previously hypermetabolic. Stomach/Bowel: Unremarkable Vascular/Lymphatic: Atherosclerosis is present, including aortoiliac atherosclerotic disease. Reproductive: Borderline prostatomegaly. Other: Stable trace presacral edema. Musculoskeletal: Mildly sclerotic lesions in the bony pelvis corresponding to previous metastatic disease. Mild sclerosis anteriorly in the left femoral head corresponding to a prior metastatic focus. No new metastatic lesions are identified. Lower lumbar degenerative disc disease. IMPRESSION: 1. The focus of bandlike density in nodularity which raise concern in  the left upper lobe on the prior exam is slightly less prominent today, with reduced prominence of the nodular component. This has not completely resolved. 2. Similarly the region of nodularity and bandlike density medially in the left upper lobe raised as concerning on the prior exam is reduced in thickness but still not completely resolved. 3. There are some new indistinct ground-glass opacities, reticulation, and mild subpleural nodularity in the left upper lobe which has a pattern favoring inflammation but which merit surveillance. 4. Similar appearance of volume loss and consolidation with substantial saccular bronchiectasis and fusiform bronchiectasis in the right upper lobe and to lesser degree in the right middle lobe. 5. Increase in the left pleural effusion, which is now moderate. 6. Similar manifestations of prior osseous metastatic disease, mostly sclerotic. 7. 40% superior endplate compression fracture at T12 is roughly stable in magnitude from 02/15/2021, and associated with sclerosis and about 2 mm of posterior bony retropulsion. 8. The previously hypermetabolic right kidney upper pole mass is similar to minimally reduced in size compared to the prior exam. 9. Other imaging findings of potential clinical significance: Aortic Atherosclerosis (ICD10-I70.0). Stable mild narrowing of the lower SVC. Lower lumbar degenerative disc disease. Electronically Signed   By: Van Clines M.D.   On: 04/21/2021 12:52     ASSESSMENT AND PLAN: This is a very pleasant 61 years old white male with metastatic non-small cell lung cancer that was initially diagnosed as stage IIb (T3, N0, M0) non-small cell lung cancer, adenocarcinoma presented with perihilar right upper lobe lung mass with suspicious groundglass opacity in the right middle lobe.  He had evidence for disease metastasis in March 2022 to the right kidney that was biopsy-proven in April 2022. The patient is not a good surgical candidate for resection  according to Dr. Roxan Hockey. The patient underwent a course of concurrent chemoradiation with weekly carboplatin for AUC of 2 and paclitaxel 45 mg/M2 status post 5 cycles.  He has been tolerating this treatment well except for fatigue, mild odynophagia as well as recurrent pneumonia and septicemia.  The patient also has radiation-induced pneumonitis. He was admitted to the hospital several times with recurrent pneumonia.   The patient had evidence for metastatic disease in April 2022 and he is currently undergoing systemic chemotherapy with carboplatin for AUC of 5, Alimta 500 Mg/M2 and Keytruda 200 Mg IV every 3 weeks status post 11 cycles.  Starting from cycle #5 the patient is on maintenance treatment with Alimta and Keytruda every 3 weeks. The patient has been tolerating his treatment fairly well except for occasional nausea that lasts for few days after the treatment. I recommended for the patient to proceed with cycle #12 today as planned. I will see him back for follow-up visit in 3 weeks for evaluation  before the next cycle of his treatment. For the pain management I gave him refill of Percocet. For the constipation the patient will continue on lactulose on as-needed basis. He was advised to call immediately if he has any other concerning symptoms in the interval.  The patient voices understanding of current disease status and treatment options and is in agreement with the current care plan.  All questions were answered. The patient knows to call the clinic with any problems, questions or concerns. We can certainly see the patient much sooner if necessary.  Disclaimer: This note was dictated with voice recognition software. Similar sounding words can inadvertently be transcribed and may not be corrected upon review.

## 2021-05-13 NOTE — Patient Instructions (Signed)
[facility]  Discharge Instructions: Thank you for choosing [facility] to provide your oncology and hematology care.   If you have a lab appointment with the [facility], please go directly to the [facility] and check in at the registration area.   Wear comfortable clothing and clothing appropriate for easy access to any Portacath or PICC line.   We strive to give you quality time with your provider. You may need to reschedule your appointment if you arrive late (15 or more minutes).  Arriving late affects you and other patients whose appointments are after yours.  Also, if you miss three or more appointments without notifying the office, you may be dismissed from the clinic at the providers discretion.      For prescription refill requests, have your pharmacy contact our office and allow 72 hours for refills to be completed.    Today you received the following chemotherapy and/or immunotherapy agents: Keytruda and Alimta      To help prevent nausea and vomiting after your treatment, we encourage you to take your nausea medication as directed.  BELOW ARE SYMPTOMS THAT SHOULD BE REPORTED IMMEDIATELY: *FEVER GREATER THAN 100.4 F (38 C) OR HIGHER *CHILLS OR SWEATING *NAUSEA AND VOMITING THAT IS NOT CONTROLLED WITH YOUR NAUSEA MEDICATION *UNUSUAL SHORTNESS OF BREATH *UNUSUAL BRUISING OR BLEEDING *URINARY PROBLEMS (pain or burning when urinating, or frequent urination) *BOWEL PROBLEMS (unusual diarrhea, constipation, pain near the anus) TENDERNESS IN MOUTH AND THROAT WITH OR WITHOUT PRESENCE OF ULCERS (sore throat, sores in mouth, or a toothache) UNUSUAL RASH, SWELLING OR PAIN  UNUSUAL VAGINAL DISCHARGE OR ITCHING   Items with * indicate a potential emergency and should be followed up as soon as possible or go to the Emergency Department if any problems should occur.  Please show the CHEMOTHERAPY ALERT CARD or IMMUNOTHERAPY ALERT CARD at  check-in to the Emergency Department and triage nurse.  Should you have questions after your visit or need to cancel or reschedule your appointment, please contact [facility]  Dept: [phone]  and follow the prompts.  Office hours are 8:00 a.m. to 4:30 p.m. Monday - Friday. Please note that voicemails left after 4:00 p.m. may not be returned until the following business day.  We are closed weekends and major holidays. You have access to a nurse at all times for urgent questions. Please call the main number to the clinic Dept: [phone] and follow the prompts.   For any non-urgent questions, you may also contact your provider using MyChart. We now offer e-Visits for anyone 21 and older to request care online for non-urgent symptoms. For details visit [email or web address].   Also download the MyChart app! Go to the app store, search "MyChart", open the app, select [facility], and log in with your MyChart username and password.  Due to Covid, a mask is required upon entering the hospital/clinic. If you do not have a mask, one will be given to you upon arrival. For doctor visits, patients may have 1 support person aged 43 or older with them. For treatment visits, patients cannot have anyone with them due to current Covid guidelines and our immunocompromised population.

## 2021-05-17 ENCOUNTER — Other Ambulatory Visit: Payer: Self-pay | Admitting: Family Medicine

## 2021-05-18 ENCOUNTER — Encounter: Payer: Self-pay | Admitting: Pulmonary Disease

## 2021-05-19 NOTE — Telephone Encounter (Signed)
On January 6, I had an appointment with Dr. Silas Flood. During the visit we discussed coming off the doxycycline  again. January 13 I stopped taking them. Gradually the mucus and coughing has returned and is much worse now. I am coughing most of the night and is causing nausea and vomiting. It almost feels like pneumonia again. I feel like there is a bad infection in there. I started back on the doxycycline today the 24th. Just wanting to give him an update and see what his opinion is. Thank you.    Called pt to get further info after he sent [place] the above email. He states his cough has been prod with large amounts of green to clear sputum. He has been having chills and night sweats. He started back on doxy 2 days ago. No fever.  I offered appt, but he declined, stating he just wanted to give Dr Silas Flood an update. Please advise, thanks!

## 2021-05-20 ENCOUNTER — Other Ambulatory Visit: Payer: Self-pay | Admitting: Internal Medicine

## 2021-05-20 DIAGNOSIS — C3491 Malignant neoplasm of unspecified part of right bronchus or lung: Secondary | ICD-10-CM

## 2021-05-21 ENCOUNTER — Encounter: Payer: Self-pay | Admitting: Internal Medicine

## 2021-05-24 ENCOUNTER — Ambulatory Visit (INDEPENDENT_AMBULATORY_CARE_PROVIDER_SITE_OTHER): Payer: BC Managed Care – PPO

## 2021-05-24 ENCOUNTER — Encounter: Payer: Self-pay | Admitting: Pulmonary Disease

## 2021-05-24 ENCOUNTER — Ambulatory Visit: Payer: BC Managed Care – PPO | Admitting: Pulmonary Disease

## 2021-05-24 ENCOUNTER — Other Ambulatory Visit: Payer: Self-pay

## 2021-05-24 VITALS — BP 126/68 | HR 107 | Temp 98.0°F | Ht 69.0 in | Wt 198.8 lb

## 2021-05-24 DIAGNOSIS — C3491 Malignant neoplasm of unspecified part of right bronchus or lung: Secondary | ICD-10-CM | POA: Diagnosis not present

## 2021-05-24 DIAGNOSIS — R0609 Other forms of dyspnea: Secondary | ICD-10-CM | POA: Diagnosis not present

## 2021-05-24 IMAGING — DX DG CHEST 2V
2 series · 2 of 2 positions shown · non-contrast
Comparison: [DATE]

Chest CT [DATE]

CLINICAL DATA: 60-year-old male with a history of dyspnea on
exertion

Additional history of non-small cell lung carcinoma, with restaging
on recent chest CT [REDACTED]
EXAM:
CHEST - 2 VIEW

[chest pa]
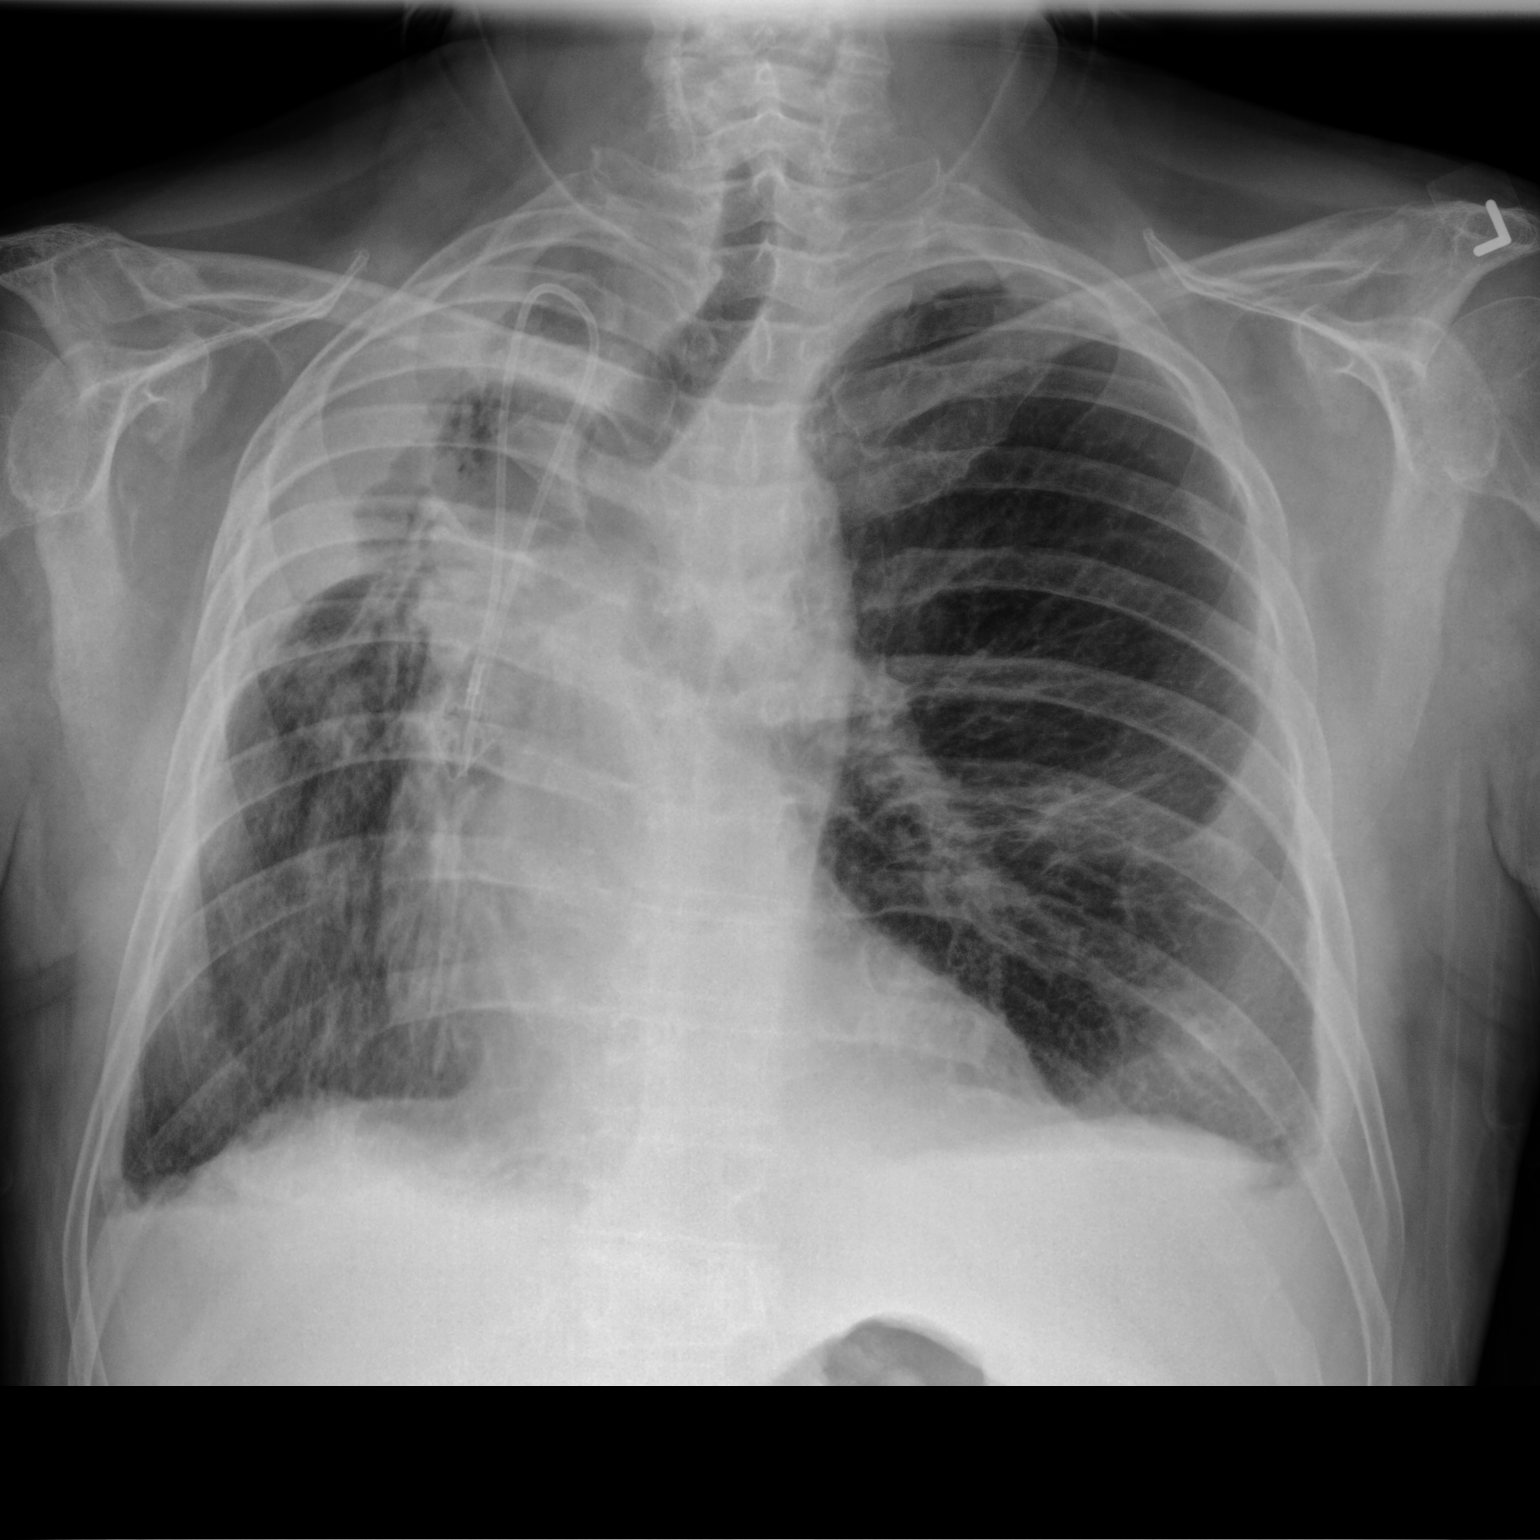

[chest lat]
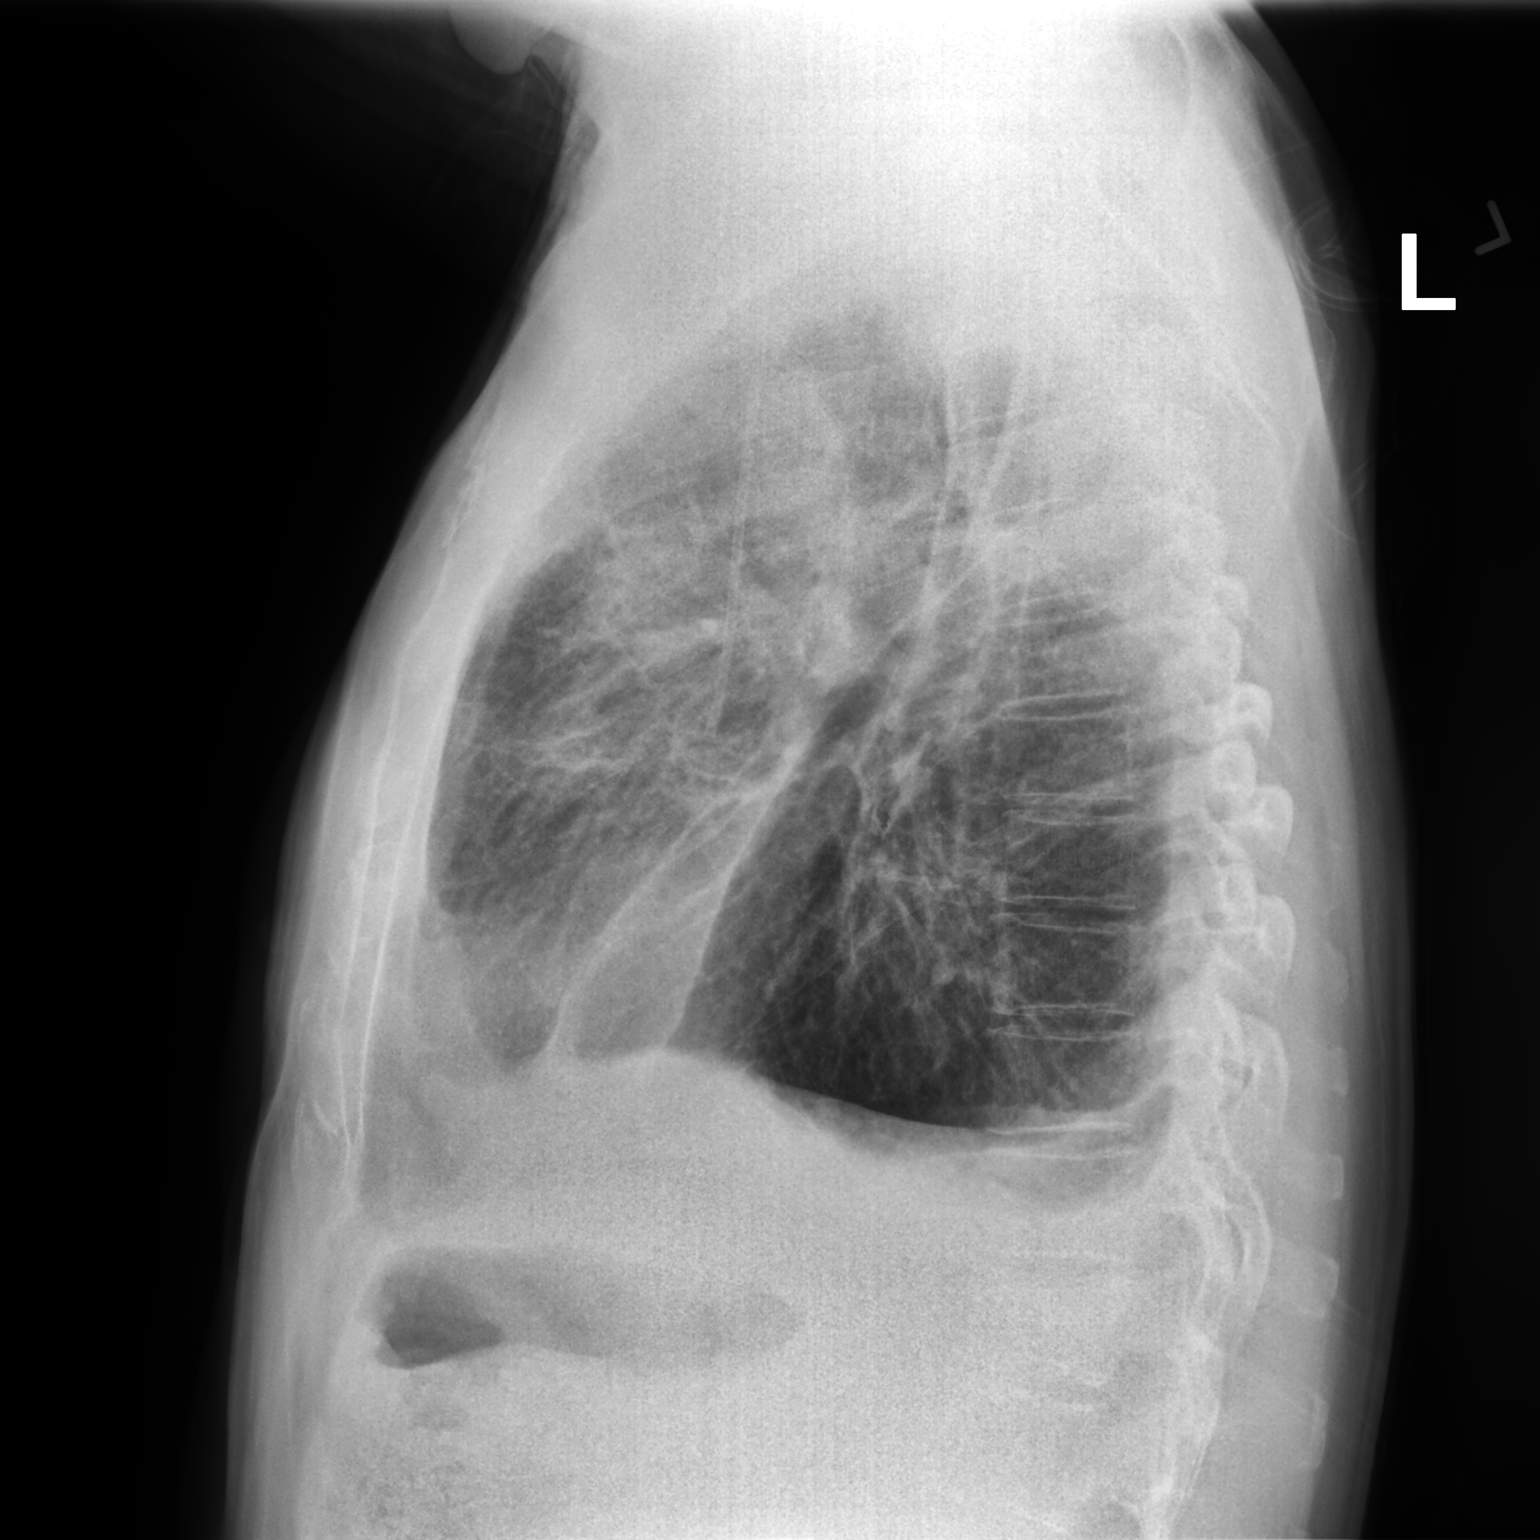

[2 of 2 positions shown; findings below may reference images not displayed]

FINDINGS: Cardiomediastinal silhouette likely unchanged with the right heart
border partially obscured by overlying lung/pleural disease.

Pleuroparenchymal thickening at the periphery of the right upper
lung, new from the comparison extending to the apex. No underlying
bony changes.

Minimal aeration of the right upper lung.

Rightward shift of the tracheal air column, increased from the
comparison chest x-ray. Shift of the mediastinal structures to the
right, similar to the prior.

Aeration of the right lung base improved from the comparison chest
x-ray.

Flattening of the hemidiaphragms bilaterally with blunting of the
bilateral costophrenic angles. The blunting on the left with
pleuroparenchymal thickening is new from the comparison chest x-ray.
Pleural fluid was present on recent chest CT on the left.

Reticulonodular opacities in the mid left lung are relatively
similar to the prior chest x-ray. These changes project over the
cardiac silhouette on the lateral view, potentially in the lingula
and would correspond to changes on recent chest CT.

Interval placement of right IJ port catheter
IMPRESSION: Reticulonodular opacities in the left mid lung correspond to
findings on recent chest CT in the lingula, potentially new
infection, and may account for the new small left-sided pleural
effusion. Follow-up is indicated in this patient with a history of
lung cancer.

Post treatment changes on the right again demonstrated with
combination of pleural thickening/fluid and consolidation in the
right upper lung and mixed interstitial and airspace disease in the
mid and lower lung, improved from the comparison chest x-ray.

Chronic rightward shift of the mediastinal structures.

Interval placement of right IJ port catheter.

## 2021-05-24 NOTE — H&P (View-Only) (Signed)
@Patient  ID: Richard Li, male    DOB: [birth year], 61 y.o.   MRN: [id number]  Chief Complaint  Patient presents with   Acute Visit    Pt is here due to increased SOB noted, when he quit the doxycycline he started to have SOB. Pt feels like he still has fluids on his lungs     Referring provider: [name], DO  HPI:   61 y.o. man with diagnosed right-sided lung cancer 12/2019 via bronchoscopy/EBUS who spent essentially all of November in the hospital for recurrent right-sided pneumonia whom are seeing in follow-up of the same.  Patient returns to clinic for routine follow-up.  At last visit we stop doxycycline.  He developed onset of phlegm, worsening cough.  Resumed doxycycline thereafter.  However, cough is not improved or changed.  He feels a bit more short of breath.  Describes left-sided shocklike pain.  Worse when he lies on it.  Feels like gravel in his skin.  He has new left-sided pleural effusion on review of his recent CT scan.  He is curious if this i returns for acute visit.  More short of breath.  The last visit we decided to try to stop the doxycycline again as it seems like it did not improve his cough.  Unfortunate, worsening phlegm, worsening cough.  Resumed doxycycline and some of the phlegm has improved a bit.  Still quite thick, more thick than prior.  In addition, he continues to endorse worsening dyspnea on exertion.  Reviewed at length his recent imaging 03/2021 that showed new's left-sided pleural effusion.  Discussed this could be worsening his biggest concern, likely malignant pleural effusion.  With worsening cough, it is possible he developed resistance to doxycycline and was keeping chronic infected necrotic tumor at [facility].  Lastly, discussed possibility of pulmonary embolus given his active cancer.  May need a CTA later this week if chest x-ray is reassuring.   HPI at initial visit: Started with productive cough it seems early November.  This is prompted 4  separate hospitalizations and multiple antibiotic courses.  He had recently completed first cycle of carboplatin/paclitaxel.  Was in the midst of radiation therapy to the right upper lobe mass which he completed around Thanksgiving 2021.  He has had cough productive of green sputum.  It is foul tasting.  Smells bad.  Despite multiple rounds of antibiotic is not improved.  His shortness of breath is worsening.  The most recent hospitalization 03/2020 a bronchoscopy was pursued. Mucopurulent secretions noted emanating from RUL. Secretions likely aspirated seen in BI, RLL. Culture with OP flora. No cell counts sent.  Continually coughing, producing sputum as above. Worried because not getting chemotherapy for cancer. Recently completed coures 14 day prednisone for possible radiation pneumonitis. This did not help symptoms. Finishing course of augmentin. Not seen improvement.  Serial CXRs reviewed which show persistent and worsening R mid and upper lung field opacities. CT 02/2020 reviewed with necrotic appearing mass near RUL takeoff centrally, dense consolidations posterior RUL and throughout R with scattered GGOs in left lung.   Questionaires / Pulmonary Flowsheets:   ACT:  No flowsheet data found.  MMRC: mMRC Dyspnea Scale mMRC Score  06/05/2020 2    Epworth:  No flowsheet data found.  Tests:   FENO:  No results found for: NITRICOXIDE  PFT: No flowsheet data found.  WALK:  No flowsheet data found.  Imaging: Personally reviewed and as per EMR and discussion this note  MR Brain W Wo Contrast  Result Date: 04/29/2021 CLINICAL DATA:  Brain/CNS neoplasm, monitor 3T SRS Protocol. Follow-up of treated brain. Metastasis to brain, monitor EXAM: MRI HEAD WITHOUT AND WITH CONTRAST TECHNIQUE: Multiplanar, multiecho pulse sequences of the brain and surrounding structures were obtained without and with intravenous contrast. CONTRAST:  88mL MULTIHANCE GADOBENATE DIMEGLUMINE 529 MG/ML IV SOLN  COMPARISON:  01/15/2021 FINDINGS: Brain: No acute infarct, mass effect or extra-axial collection. Focus of chronic microhemorrhage in the cerebellum, unchanged. Normal white matter signal, parenchymal volume and CSF spaces. The midline structures are normal. Unchanged 3 mm contrast enhancing lesion of the medial right cerebellum. No new contrast-enhancing lesions. Vascular: Major flow voids are preserved. Skull and upper cervical spine: Normal calvarium and skull base. Visualized upper cervical spine and soft tissues are normal. Sinuses/Orbits:No paranasal sinus fluid levels or advanced mucosal thickening. No mastoid or middle ear effusion. Normal orbits. 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 No new contrast-enhancing lesions. Electronically Signed   By: Ulyses Jarred M.D.   On: 04/29/2021 13:29     Lab Results: Personally reviewed CBC    Component Value Date/Time   WBC 6.6 05/13/2021 0851   WBC 7.9 08/20/2020 0941   RBC 3.17 (L) 05/13/2021 0851   HGB 10.4 (L) 05/13/2021 0851   HCT 30.8 (L) 05/13/2021 0851   PLT 304 05/13/2021 0851   MCV 97.2 05/13/2021 0851   MCH 32.8 05/13/2021 0851   MCHC 33.8 05/13/2021 0851   RDW 15.2 05/13/2021 0851   LYMPHSABS 0.5 (L) 05/13/2021 0851   MONOABS 0.8 05/13/2021 0851   EOSABS 0.1 05/13/2021 0851   BASOSABS 0.0 05/13/2021 0851    BMET    Component Value Date/Time   NA 141 05/13/2021 0851   K 3.0 (L) 05/13/2021 0851   CL 107 05/13/2021 0851   CO2 27 05/13/2021 0851   GLUCOSE 114 (H) 05/13/2021 0851   BUN 8 05/13/2021 0851   CREATININE 0.94 05/13/2021 0851   CREATININE 0.67 (L) 06/24/2020 1517   CALCIUM 9.0 05/13/2021 0851   GFRNONAA >60 05/13/2021 0851   GFRNONAA 105 06/24/2020 1517   GFRAA 122 06/24/2020 1517    BNP No results found for: BNP  ProBNP No results found for: PROBNP  Specialty Problems       Pulmonary Problems   Allergic rhinitis    Qualifier: Diagnosis of  By: Julien Girt CMA, Leigh         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[facility])    Qualifier: Diagnosis of  By: Julien Girt CMA, Leigh        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   Pulmonary nodules/lesions, multiple   Adenocarcinoma of right lung, stage 2 (HCC)   SOB (shortness of breath)   Recurrent pneumonia   Adenocarcinoma of right lung, stage 4 (HCC)    Allergies  Allergen Reactions   Penicillins     Unknown reaction   Amoxicillin Rash    Immunization History  Administered Date(s) Administered   Influenza Split 01/25/2012   Influenza Whole 02/13/2009, 01/25/2010   Influenza,inj,Quad PF,6+ Mos 03/30/2015, 02/16/2016, 02/27/2017, 01/24/2018, 03/01/2019, 02/10/2020, 02/16/2021   Influenza,inj,quad, With Preservative 01/23/2017   Tdap 12/12/2011    Past Medical History:  Diagnosis Date   Allergic rhinitis, cause unspecified    Anxiety state, unspecified    Asthma    as a child   Chronic airway obstruction, not elsewhere classified    Esophageal reflux    History of kidney stones    History of radiation  therapy 02/13/2020-03/24/2020   right lung        Dr Gery Pray   Hypertension    Lumbago    lung ca dx'd 12/2019   Other and unspecified hyperlipidemia    Other chest pain     Tobacco History: Social History   Tobacco Use  Smoking Status Former   Packs/day: 2.00   Years: 28.00   Pack years: 56.00   Types: Cigarettes   Quit date: 04/25/2005   Years since quitting: 16.0  Smokeless Tobacco Never   Counseling given: Not Answered   Continue to not smoke  Outpatient Encounter Medications as of 05/24/2021  Medication Sig   acetaminophen (TYLENOL) 500 MG tablet Take 1,000 mg by mouth 2 (two) times daily.   COMBIVENT RESPIMAT 20-100 MCG/ACT AERS respimat INHALE 1 TO 2 PUFFS BY MOUTH EVERY 6 HOURS AS NEEDED FOR WHEEZING   dextromethorphan-guaiFENesin (MUCINEX DM) 30-600 MG 12hr tablet Take 1 tablet by mouth 2 (two) times daily.   doxycycline (VIBRA-TABS)  100 MG tablet Take 100 mg by mouth 2 (two) times daily.   feeding supplement (ENSURE ENLIVE / ENSURE PLUS) LIQD Take 237 mLs by mouth 3 (three) times daily between meals. (Patient taking differently: Take 237 mLs by mouth daily as needed.)   fenofibrate 160 MG tablet Take 1 tablet by mouth once daily   ferrous sulfate 325 (65 FE) MG tablet Take 1 tablet (325 mg total) by mouth daily with breakfast.   folic acid (FOLVITE) 1 MG tablet Take 1 tablet by mouth once daily   lactulose (CHRONULAC) 10 GM/15ML solution TAKE 15 MLS BY MOUTH 3 TIMES DAILY   lidocaine-prilocaine (EMLA) cream Apply 1 application topically as needed.   LORazepam (ATIVAN) 1 MG tablet Take 1 tablet (1 mg total) by mouth every 8 (eight) hours as needed for anxiety.   meclizine (ANTIVERT) 25 MG tablet Take 1 tablet (25 mg total) by mouth every 6 (six) hours as needed for dizziness.   Multiple Vitamin (MULTIVITAMIN WITH MINERALS) TABS tablet Take 1 tablet by mouth daily.   ondansetron (ZOFRAN) 8 MG tablet Take 1 tablet (8 mg total) by mouth every 8 (eight) hours as needed for nausea or vomiting. Starting day 3 after chemotherapy   oxyCODONE-acetaminophen (PERCOCET/ROXICET) 5-325 MG tablet Take 1 tablet by mouth every 6 (six) hours as needed for severe pain.   potassium chloride SA (KLOR-CON M) 20 MEQ tablet Take 1 tablet (20 mEq total) by mouth daily.   predniSONE (DELTASONE) 10 MG tablet Take 1 tablet by mouth once daily with breakfast   prochlorperazine (COMPAZINE) 10 MG tablet Take 1 tablet (10 mg total) by mouth every 6 (six) hours as needed.   [DISCONTINUED] sucralfate (CARAFATE) 1 g tablet Take 1 tablet (1 g total) by mouth 4 (four) times daily -  with meals and at bedtime. (Patient not taking: Reported on 03/05/2020)   Facility-Administered Encounter Medications as of 05/24/2021  Medication   0.9 %  sodium chloride infusion     Review of Systems n/a  Physical Exam  BP 126/68 (BP Location: Left Arm, Patient Position:  Sitting, Cuff Size: Normal)    Pulse (!) 107    Temp 98 F (36.7 C) (Oral)    Ht 5\' 9"  (1.753 m)    Wt 198 lb 12.8 oz (90.2 kg)    SpO2 98%    BMI 29.36 kg/m   Wt Readings from Last 5 Encounters:  05/24/21 198 lb 12.8 oz (90.2 kg)  05/13/21 197 lb 12.8  oz (89.7 kg)  05/04/21 195 lb 12.8 oz (88.8 kg)  04/30/21 195 lb 12.8 oz (88.8 kg)  04/22/21 196 lb 8 oz (89.1 kg)    BMI Readings from Last 5 Encounters:  05/24/21 29.36 kg/m  05/13/21 28.38 kg/m  05/04/21 28.09 kg/m  04/30/21 28.09 kg/m  04/22/21 28.19 kg/m     Physical Exam General: Sitting up in exam chair, in NAD Eyes: EOMI, no icterus Respiratory: bronchial breath sounds on inspiration throughout the right particularly upper lung fields, left clear, diminished right base Cardiovascular: tachycardic, no murmurs Extremities: No edema, warm   Assessment & Plan:   Productive cough, CT/CXR infiltrate: Continue doxycycline and prednisone.  Have trialed stopping doxycycline twice now without improvement, interval worsening of symptoms but seems.  Increase prednisone to 20 mg daily x7 days then back to 10 mg daily.   Dyspnea exertion, weakness: Worsening recently.  Pleural effusion on left 03/2021 CT scan.  Worried this is increasing.  Causing symptoms.  PE also considered.  Start with chest x-ray, if worse left-sided pleural effusion we will plan to perform thoracentesis later this week.  If not, anticipate CTA to evaluate for pulmonary embolus.  He has baseline tachycardia is not worsened.   Return in about 4 weeks (around 06/21/2021).   Lanier Clam, MD 05/24/2021

## 2021-05-24 NOTE — Patient Instructions (Signed)
Nice to see you again  Thing I worry about most with your worsening shortness of breath is fluid accumulating around the lung like we saw a small amount on the scan in December  We will get a chest x-ray today to see if we see more fluid  If there is not a lot of extra fluid, but I recommended a CT scan later in the week to look at a better picture of the lungs as well to evaluate for possible blood clot.  Increase prednisone to 20 mg daily for the next week, then go back to 10 mg daily.  I am hopeful this will help with your cough.  Return to clinic in 4 weeks or sooner as needed with Dr. Silas Flood

## 2021-05-24 NOTE — Progress Notes (Signed)
@Patient  ID: Richard Li, male    DOB: 06/04/1960, 61 y.o.   MRN: [id number]  Chief Complaint  Patient presents with   Acute Visit    Pt is here due to increased SOB noted, when he quit the doxycycline he started to have SOB. Pt feels like he still has fluids on his lungs     Referring provider: [name], DO  HPI:   61 y.o. man with diagnosed right-sided lung cancer 12/2019 via bronchoscopy/EBUS who spent essentially all of November in the hospital for recurrent right-sided pneumonia whom are seeing in follow-up of the same.  Patient returns to clinic for routine follow-up.  At last visit we stop doxycycline.  He developed onset of phlegm, worsening cough.  Resumed doxycycline thereafter.  However, cough is not improved or changed.  He feels a bit more short of breath.  Describes left-sided shocklike pain.  Worse when he lies on it.  Feels like gravel in his skin.  He has new left-sided pleural effusion on review of his recent CT scan.  He is curious if this i returns for acute visit.  More short of breath.  The last visit we decided to try to stop the doxycycline again as it seems like it did not improve his cough.  Unfortunate, worsening phlegm, worsening cough.  Resumed doxycycline and some of the phlegm has improved a bit.  Still quite thick, more thick than prior.  In addition, he continues to endorse worsening dyspnea on exertion.  Reviewed at length his recent imaging 03/2021 that showed new's left-sided pleural effusion.  Discussed this could be worsening his biggest concern, likely malignant pleural effusion.  With worsening cough, it is possible he developed resistance to doxycycline and was keeping chronic infected necrotic tumor at [place].  Lastly, discussed possibility of pulmonary embolus given his active cancer.  May need a CTA later this week if chest x-ray is reassuring.   HPI at initial visit: Started with productive cough it seems early November.  This is prompted 4  separate hospitalizations and multiple antibiotic courses.  He had recently completed first cycle of carboplatin/paclitaxel.  Was in the midst of radiation therapy to the right upper lobe mass which he completed around Thanksgiving 2021.  He has had cough productive of green sputum.  It is foul tasting.  Smells bad.  Despite multiple rounds of antibiotic is not improved.  His shortness of breath is worsening.  The most recent hospitalization 03/2020 a bronchoscopy was pursued. Mucopurulent secretions noted emanating from RUL. Secretions likely aspirated seen in BI, RLL. Culture with OP flora. No cell counts sent.  Continually coughing, producing sputum as above. Worried because not getting chemotherapy for cancer. Recently completed coures 14 day prednisone for possible radiation pneumonitis. This did not help symptoms. Finishing course of augmentin. Not seen improvement.  Serial CXRs reviewed which show persistent and worsening R mid and upper lung field opacities. CT 02/2020 reviewed with necrotic appearing mass near RUL takeoff centrally, dense consolidations posterior RUL and throughout R with scattered GGOs in left lung.   Questionaires / Pulmonary Flowsheets:   ACT:  No flowsheet data found.  MMRC: mMRC Dyspnea Scale mMRC Score  06/05/2020 2    Epworth:  No flowsheet data found.  Tests:   FENO:  No results found for: NITRICOXIDE  PFT: No flowsheet data found.  WALK:  No flowsheet data found.  Imaging: Personally reviewed and as per EMR and discussion this note  MR Brain W Wo Contrast  Result Date: 04/29/2021 CLINICAL DATA:  Brain/CNS neoplasm, monitor 3T SRS Protocol. Follow-up of treated brain. Metastasis to brain, monitor EXAM: MRI HEAD WITHOUT AND WITH CONTRAST TECHNIQUE: Multiplanar, multiecho pulse sequences of the brain and surrounding structures were obtained without and with intravenous contrast. CONTRAST:  3mL MULTIHANCE GADOBENATE DIMEGLUMINE 529 MG/ML IV SOLN  COMPARISON:  01/15/2021 FINDINGS: Brain: No acute infarct, mass effect or extra-axial collection. Focus of chronic microhemorrhage in the cerebellum, unchanged. Normal white matter signal, parenchymal volume and CSF spaces. The midline structures are normal. Unchanged 3 mm contrast enhancing lesion of the medial right cerebellum. No new contrast-enhancing lesions. Vascular: Major flow voids are preserved. Skull and upper cervical spine: Normal calvarium and skull base. Visualized upper cervical spine and soft tissues are normal. Sinuses/Orbits:No paranasal sinus fluid levels or advanced mucosal thickening. No mastoid or middle ear effusion. Normal orbits. 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 No new contrast-enhancing lesions. Electronically Signed   By: Ulyses Jarred M.D.   On: 04/29/2021 13:29     Lab Results: Personally reviewed CBC    Component Value Date/Time   WBC 6.6 05/13/2021 0851   WBC 7.9 08/20/2020 0941   RBC 3.17 (L) 05/13/2021 0851   HGB 10.4 (L) 05/13/2021 0851   HCT 30.8 (L) 05/13/2021 0851   PLT 304 05/13/2021 0851   MCV 97.2 05/13/2021 0851   MCH 32.8 05/13/2021 0851   MCHC 33.8 05/13/2021 0851   RDW 15.2 05/13/2021 0851   LYMPHSABS 0.5 (L) 05/13/2021 0851   MONOABS 0.8 05/13/2021 0851   EOSABS 0.1 05/13/2021 0851   BASOSABS 0.0 05/13/2021 0851    BMET    Component Value Date/Time   NA 141 05/13/2021 0851   K 3.0 (L) 05/13/2021 0851   CL 107 05/13/2021 0851   CO2 27 05/13/2021 0851   GLUCOSE 114 (H) 05/13/2021 0851   BUN 8 05/13/2021 0851   CREATININE 0.94 05/13/2021 0851   CREATININE 0.67 (L) 06/24/2020 1517   CALCIUM 9.0 05/13/2021 0851   GFRNONAA >60 05/13/2021 0851   GFRNONAA 105 06/24/2020 1517   GFRAA 122 06/24/2020 1517    BNP No results found for: BNP  ProBNP No results found for: PROBNP  Specialty Problems       Pulmonary Problems   Allergic rhinitis    Qualifier: Diagnosis of  By: Julien Girt CMA, Leigh         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[place])    Qualifier: Diagnosis of  By: Julien Girt CMA, Leigh        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   Pulmonary nodules/lesions, multiple   Adenocarcinoma of right lung, stage 2 (HCC)   SOB (shortness of breath)   Recurrent pneumonia   Adenocarcinoma of right lung, stage 4 (HCC)    Allergies  Allergen Reactions   Penicillins     Unknown reaction   Amoxicillin Rash    Immunization History  Administered Date(s) Administered   Influenza Split 01/25/2012   Influenza Whole 02/13/2009, 01/25/2010   Influenza,inj,Quad PF,6+ Mos 03/30/2015, 02/16/2016, 02/27/2017, 01/24/2018, 03/01/2019, 02/10/2020, 02/16/2021   Influenza,inj,quad, With Preservative 01/23/2017   Tdap 12/12/2011    Past Medical History:  Diagnosis Date   Allergic rhinitis, cause unspecified    Anxiety state, unspecified    Asthma    as a child   Chronic airway obstruction, not elsewhere classified    Esophageal reflux    History of kidney stones    History of radiation  therapy 02/13/2020-03/24/2020   right lung        Dr Gery Pray   Hypertension    Lumbago    lung ca dx'd 12/2019   Other and unspecified hyperlipidemia    Other chest pain     Tobacco History: Social History   Tobacco Use  Smoking Status Former   Packs/day: 2.00   Years: 28.00   Pack years: 56.00   Types: Cigarettes   Quit date: 04/25/2005   Years since quitting: 16.0  Smokeless Tobacco Never   Counseling given: Not Answered   Continue to not smoke  Outpatient Encounter Medications as of 05/24/2021  Medication Sig   acetaminophen (TYLENOL) 500 MG tablet Take 1,000 mg by mouth 2 (two) times daily.   COMBIVENT RESPIMAT 20-100 MCG/ACT AERS respimat INHALE 1 TO 2 PUFFS BY MOUTH EVERY 6 HOURS AS NEEDED FOR WHEEZING   dextromethorphan-guaiFENesin (MUCINEX DM) 30-600 MG 12hr tablet Take 1 tablet by mouth 2 (two) times daily.   doxycycline (VIBRA-TABS)  100 MG tablet Take 100 mg by mouth 2 (two) times daily.   feeding supplement (ENSURE ENLIVE / ENSURE PLUS) LIQD Take 237 mLs by mouth 3 (three) times daily between meals. (Patient taking differently: Take 237 mLs by mouth daily as needed.)   fenofibrate 160 MG tablet Take 1 tablet by mouth once daily   ferrous sulfate 325 (65 FE) MG tablet Take 1 tablet (325 mg total) by mouth daily with breakfast.   folic acid (FOLVITE) 1 MG tablet Take 1 tablet by mouth once daily   lactulose (CHRONULAC) 10 GM/15ML solution TAKE 15 MLS BY MOUTH 3 TIMES DAILY   lidocaine-prilocaine (EMLA) cream Apply 1 application topically as needed.   LORazepam (ATIVAN) 1 MG tablet Take 1 tablet (1 mg total) by mouth every 8 (eight) hours as needed for anxiety.   meclizine (ANTIVERT) 25 MG tablet Take 1 tablet (25 mg total) by mouth every 6 (six) hours as needed for dizziness.   Multiple Vitamin (MULTIVITAMIN WITH MINERALS) TABS tablet Take 1 tablet by mouth daily.   ondansetron (ZOFRAN) 8 MG tablet Take 1 tablet (8 mg total) by mouth every 8 (eight) hours as needed for nausea or vomiting. Starting day 3 after chemotherapy   oxyCODONE-acetaminophen (PERCOCET/ROXICET) 5-325 MG tablet Take 1 tablet by mouth every 6 (six) hours as needed for severe pain.   potassium chloride SA (KLOR-CON M) 20 MEQ tablet Take 1 tablet (20 mEq total) by mouth daily.   predniSONE (DELTASONE) 10 MG tablet Take 1 tablet by mouth once daily with breakfast   prochlorperazine (COMPAZINE) 10 MG tablet Take 1 tablet (10 mg total) by mouth every 6 (six) hours as needed.   [DISCONTINUED] sucralfate (CARAFATE) 1 g tablet Take 1 tablet (1 g total) by mouth 4 (four) times daily -  with meals and at bedtime. (Patient not taking: Reported on 03/05/2020)   Facility-Administered Encounter Medications as of 05/24/2021  Medication   0.9 %  sodium chloride infusion     Review of Systems n/a  Physical Exam  BP 126/68 (BP Location: Left Arm, Patient Position:  Sitting, Cuff Size: Normal)    Pulse (!) 107    Temp 98 F (36.7 C) (Oral)    Ht 5\' 9"  (1.753 m)    Wt 198 lb 12.8 oz (90.2 kg)    SpO2 98%    BMI 29.36 kg/m   Wt Readings from Last 5 Encounters:  05/24/21 198 lb 12.8 oz (90.2 kg)  05/13/21 197 lb 12.8  oz (89.7 kg)  05/04/21 195 lb 12.8 oz (88.8 kg)  04/30/21 195 lb 12.8 oz (88.8 kg)  04/22/21 196 lb 8 oz (89.1 kg)    BMI Readings from Last 5 Encounters:  05/24/21 29.36 kg/m  05/13/21 28.38 kg/m  05/04/21 28.09 kg/m  04/30/21 28.09 kg/m  04/22/21 28.19 kg/m     Physical Exam General: Sitting up in exam chair, in NAD Eyes: EOMI, no icterus Respiratory: bronchial breath sounds on inspiration throughout the right particularly upper lung fields, left clear, diminished right base Cardiovascular: tachycardic, no murmurs Extremities: No edema, warm   Assessment & Plan:   Productive cough, CT/CXR infiltrate: Continue doxycycline and prednisone.  Have trialed stopping doxycycline twice now without improvement, interval worsening of symptoms but seems.  Increase prednisone to 20 mg daily x7 days then back to 10 mg daily.   Dyspnea exertion, weakness: Worsening recently.  Pleural effusion on left 03/2021 CT scan.  Worried this is increasing.  Causing symptoms.  PE also considered.  Start with chest x-ray, if worse left-sided pleural effusion we will plan to perform thoracentesis later this week.  If not, anticipate CTA to evaluate for pulmonary embolus.  He has baseline tachycardia is not worsened.   Return in about 4 weeks (around 06/21/2021).   Lanier Clam, MD 05/24/2021

## 2021-05-25 ENCOUNTER — Encounter: Payer: Self-pay | Admitting: Pulmonary Disease

## 2021-05-26 NOTE — Telephone Encounter (Signed)
Per Dr. Silas Flood please get this patient in for a  drain on Friday 05/28/21. Chest xray does show a bit bigger size of fluid on left lung. I would recommend we drain it if possible and see if it helps. I may be able to arrange for Friday if that is ok with you.   Thanks, Dr. Silas Flood

## 2021-05-27 ENCOUNTER — Telehealth: Payer: Self-pay | Admitting: Pulmonary Disease

## 2021-05-27 NOTE — Telephone Encounter (Signed)
Called and spoke with patient. Let them know their Richard Li is scheduled for 05/28/2021 with Dr. Silas Flood at [facility] at 2:30 pm.  Patient was instructed to arrive at hospital at 2:00 pm. Patient voiced understanding, nothing further needed  Routing to Dr. Silas Flood as Juluis Rainier

## 2021-05-27 NOTE — Telephone Encounter (Signed)
Called and spoke with patient. Let them know their Richard Li is scheduled for 05/28/2021 with Dr. Silas Flood at [facility] at 2:30 pm.  Patient was instructed to arrive at hospital at 2:00 pm. Patient voiced understanding, nothing further needed  Routing to Dr. Silas Flood as Juluis Rainier

## 2021-05-27 NOTE — Telephone Encounter (Signed)
Called and spoke to pt. Pt states he is following up on his thoracentesis for 2/3 as he hasnt been contacted yet to confirm the procedure. (See mychart message from 1/31).   Will forward to [facility] an procedure pool to confirm or schedule for different day.

## 2021-05-28 ENCOUNTER — Ambulatory Visit (HOSPITAL_COMMUNITY)
Admission: RE | Admit: 2021-05-28 | Discharge: 2021-05-28 | Disposition: A | Discharge status: Home / Self Care | Payer: BC Managed Care – PPO | Attending: Pulmonary Disease | Admitting: Pulmonary Disease

## 2021-05-28 ENCOUNTER — Encounter (HOSPITAL_COMMUNITY)
Admission: RE | Disposition: A | Discharge status: Home / Self Care | Payer: Self-pay | Source: Home / Self Care | Attending: Pulmonary Disease

## 2021-05-28 DIAGNOSIS — Z923 Personal history of irradiation: Secondary | ICD-10-CM | POA: Insufficient documentation

## 2021-05-28 DIAGNOSIS — Z7952 Long term (current) use of systemic steroids: Secondary | ICD-10-CM | POA: Diagnosis not present

## 2021-05-28 DIAGNOSIS — Z87891 Personal history of nicotine dependence: Secondary | ICD-10-CM | POA: Diagnosis not present

## 2021-05-28 DIAGNOSIS — C3491 Malignant neoplasm of unspecified part of right bronchus or lung: Secondary | ICD-10-CM | POA: Insufficient documentation

## 2021-05-28 DIAGNOSIS — J90 Pleural effusion, not elsewhere classified: Secondary | ICD-10-CM | POA: Insufficient documentation

## 2021-05-28 HISTORY — PX: THORACENTESIS: SHX235

## 2021-05-28 LAB — BODY FLUID CELL COUNT WITH DIFFERENTIAL
Eos, Fluid: 0 %
Lymphs, Fluid: 84 %
Monocyte-Macrophage-Serous Fluid: 12 % — ABNORMAL LOW (ref 50–90)
Neutrophil Count, Fluid: 4 % (ref 0–25)
Total Nucleated Cell Count, Fluid: 47 cu mm (ref 0–1000)

## 2021-05-28 LAB — LACTATE DEHYDROGENASE, PLEURAL OR PERITONEAL FLUID: LD, Fluid: 148 U/L — ABNORMAL HIGH (ref 3–23)

## 2021-05-28 LAB — PROTEIN, PLEURAL OR PERITONEAL FLUID: Total protein, fluid: 3.8 g/dL

## 2021-05-28 SURGERY — THORACENTESIS
Anesthesia: LOCAL

## 2021-05-28 MED ORDER — PREDNISONE 10 MG PO TABS: 10.0000 mg | ORAL_TABLET | Freq: Every day | ORAL | 3 refills | Status: DC

## 2021-05-28 NOTE — Interval H&P Note (Signed)
History and Physical Interval Note:  05/28/2021 1:54 PM  Richard Li  has presented today for surgery, with the diagnosis of plerual effusion.  The various methods of treatment have been discussed with the patient and family. After consideration of risks, benefits and other options for treatment, the patient has consented to  Procedure(s): THORACENTESIS (N/A) as a surgical intervention.  The patient's history has been reviewed, patient examined, no change in status, stable for surgery.  I have reviewed the patient's chart and labs.  Questions were answered to the patient's satisfaction.     Bonna Gains Sueko Dimichele

## 2021-05-28 NOTE — Op Note (Signed)
Thoracentesis  Procedure Note  Richard Li  [id number]  [birth year]  Date:05/28/21  Time:3:42 PM   Provider Performing:Danyia Borunda R Amanie Mcculley   Procedure: Thoracentesis with imaging guidance (74081)  Indication(s) Pleural Effusion  Consent Risks of the procedure as well as the alternatives and risks of each were explained to the patient and/or caregiver.  Consent for the procedure was obtained and is signed in the bedside chart  Anesthesia Topical only with 1% lidocaine    Time Out Verified patient identification, verified procedure, site/side was marked, verified correct patient position, special equipment/implants available, medications/allergies/relevant history reviewed, required imaging and test results available.   Sterile Technique Maximal sterile technique including full sterile barrier drape, hand hygiene, sterile gown, sterile gloves, mask, hair covering, sterile ultrasound probe cover (if used).  Procedure Description Ultrasound was used to identify appropriate pleural anatomy for placement and overlying skin marked.  Area of drainage cleaned and draped in sterile fashion. Lidocaine was used to anesthetize the skin and subcutaneous tissue.  1300 cc's of serous appearing fluid was drained from the left pleural space. Procedure terminated due to slight L shoulder discomfort. Catheter then removed and bandaid applied to site.   Complications/Tolerance None; patient tolerated the procedure well.    EBL none   Specimen(s) Pleural fluid

## 2021-05-31 ENCOUNTER — Encounter (HOSPITAL_COMMUNITY): Payer: Self-pay | Admitting: Pulmonary Disease

## 2021-06-01 LAB — CYTOLOGY - NON PAP

## 2021-06-01 NOTE — Progress Notes (Signed)
[facility] OFFICE PROGRESS NOTE  [name], DO [place]  [address] [place] [address]  DIAGNOSIS: Metastatic non-small cell lung cancer initially diagnosed as stage IIB (T3, N0, M0) non-small cell lung cancer, adenocarcinoma presented with large central perihilar mass with suspicious groundglass opacity in the right middle lobe and left upper lobe diagnosed in September 2021.  The patient had evidence of metastatic disease to the right kidney, right pleural space, liver, thoracic:/Abdominal lymph nodes, and bones.  There is also a probable left-sided pleural metastasis as well in March 2022/May 2022.    Molecular studies by guardant 360: No actionable mutations.   PDl1: 80%  PRIOR THERAPY:  1) Concurrent chemoradiation with weekly carboplatin for AUC of 2 and paclitaxel 45 mg/M2.  First dose February 10, 2020.  Status post 5  cycles. 2) Palliative radiation to the left anterior rib cage area as well as the mid back area under the care of Dr. Sondra Come. Last treatment on 10/05/20. 3) SRS to the brain (central right cerebellum) lesion completed on 10/13/20.   CURRENT THERAPY: Palliative systemic chemotherapy with carboplatin AUC of 5, Alimta 500 mg per metered squared, Keytruda 200 mg IV every 3 weeks.  First dose expected on 09/24/2020. Status post 12 cycles.  Starting from cycle #5, the patient has been on maintenance Alimta and Keytruda.  INTERVAL HISTORY: Richard Li 61 y.o. male returns to the clinic today for a follow-up visit. The patient was last seen in the clinic 3 weeks ago.  The patient is feeling fairly well today except for fatigue. In the interval since his last appointment, he underwent a thoracentesis by Dr. Silas Flood for his pleural effusion. 1.3 L of fluid was yielded.  He feels a lot better since having this fluid drained.  He notices that he is able to take more deep breaths and has been coughing less.  Additionally Dr. Silas Flood put the  patient on prednisone and doxycycline.  The patient is presently taking prednisone 10 mg/day.  The patient was prescribed a 90-day supply per chart review.  Otherwise the patient is feeling fairly well.  He denies any fever or chills.  He intermittently gets night sweats.  He reports his baseline dyspnea on exertion and chronic cough although improved since having a thoracentesis. He occasionally has left-sided rib pain since he has metastatic disease to the left rib.  He completed palliative radiation to this area.  He is also prescribed oxycodone for pain control.  He is requesting a refill.  He sometimes has nausea/dry heaves.  He has a prescription for Zofran and Compazine.  It is worse first thing in the morning.  He has constipation but it is not better since being prescribed lactulose.  He is requesting a refill.  Denies any headache or visual changes.  He denies any headache or visual changes.  The patient is here today for evaluation and repeat blood work before starting cycle #13.       MEDICAL HISTORY: Past Medical History:  Diagnosis Date   Allergic rhinitis, cause unspecified    Anxiety state, unspecified    Asthma    as a child   Chronic airway obstruction, not elsewhere classified    Esophageal reflux    History of kidney stones    History of radiation therapy 02/13/2020-03/24/2020   right lung        Dr Gery Pray   Hypertension    Lumbago    lung ca dx'd 12/2019  Other and unspecified hyperlipidemia    Other chest pain     ALLERGIES:  is allergic to penicillins and amoxicillin.  MEDICATIONS:  Current Outpatient Medications  Medication Sig Dispense Refill   acetaminophen (TYLENOL) 500 MG tablet Take 1,000 mg by mouth every 8 (eight) hours as needed for moderate pain or mild pain.     COMBIVENT RESPIMAT 20-100 MCG/ACT AERS respimat INHALE 1 TO 2 PUFFS BY MOUTH EVERY 6 HOURS AS NEEDED FOR WHEEZING 4 g 5   dextromethorphan-guaiFENesin (MUCINEX DM) 30-600 MG 12hr  tablet Take 1 tablet by mouth 2 (two) times daily.     doxycycline (VIBRA-TABS) 100 MG tablet Take 100 mg by mouth 2 (two) times daily.     feeding supplement (ENSURE ENLIVE / ENSURE PLUS) LIQD Take 237 mLs by mouth 3 (three) times daily between meals. (Patient taking differently: Take 237 mLs by mouth daily.) 237 mL 12   fenofibrate 160 MG tablet Take 1 tablet by mouth once daily 90 tablet 0   ferrous sulfate 325 (65 FE) MG tablet Take 1 tablet (325 mg total) by mouth daily with breakfast. 30 tablet 0   folic acid (FOLVITE) 1 MG tablet Take 1 tablet by mouth once daily 30 tablet 0   lactulose (CHRONULAC) 10 GM/15ML solution TAKE 15 MLS BY MOUTH 3 TIMES DAILY Strength: 10 GM/15ML 236 mL 0   lidocaine-prilocaine (EMLA) cream Apply 1 application topically as needed. (Patient not taking: Reported on 05/27/2021) 30 g 2   LORazepam (ATIVAN) 1 MG tablet Take 1 tablet (1 mg total) by mouth every 8 (eight) hours as needed for anxiety. (Patient taking differently: Take 1 mg by mouth 2 (two) times daily.) 90 tablet 5   Multiple Vitamin (MULTIVITAMIN WITH MINERALS) TABS tablet Take 1 tablet by mouth daily.     ondansetron (ZOFRAN) 8 MG tablet Take 1 tablet (8 mg total) by mouth every 8 (eight) hours as needed for nausea or vomiting. Starting day 3 after chemotherapy 30 tablet 2   oxyCODONE-acetaminophen (PERCOCET/ROXICET) 5-325 MG tablet Take 1 tablet by mouth every 6 (six) hours as needed for severe pain. 30 tablet 0   potassium chloride SA (KLOR-CON M) 20 MEQ tablet Take 1 tablet (20 mEq total) by mouth daily. 7 tablet 0   predniSONE (DELTASONE) 10 MG tablet Take 1 tablet (10 mg total) by mouth daily with breakfast. 90 tablet 3   prochlorperazine (COMPAZINE) 10 MG tablet Take 1 tablet (10 mg total) by mouth every 6 (six) hours as needed. 30 tablet 2   No current facility-administered medications for this visit.   Facility-Administered Medications Ordered in Other Visits  Medication Dose Route Frequency  Provider Last Rate Last Admin   0.9 %  sodium chloride infusion   Intravenous Continuous Curt Bears, MD   Stopped at 03/05/20 1410    SURGICAL HISTORY:  Past Surgical History:  Procedure Laterality Date   APPENDECTOMY     BRONCHIAL BIOPSY  01/07/2020   Procedure: BRONCHIAL BIOPSIES;  Surgeon: Collene Gobble, MD;  Location: [facility];  Service: Pulmonary;;   BRONCHIAL BIOPSY  04/05/2020   Procedure: BRONCHIAL BIOPSIES;  Surgeon: Rigoberto Noel, MD;  Location: [facility];  Service: Cardiopulmonary;;   BRONCHIAL BRUSHINGS  01/07/2020   Procedure: BRONCHIAL BRUSHINGS;  Surgeon: Collene Gobble, MD;  [facility] ENDOSCOPY;  Service: Pulmonary;;   BRONCHIAL NEEDLE ASPIRATION BIOPSY  01/07/2020   Procedure: BRONCHIAL NEEDLE ASPIRATION BIOPSIES;  Surgeon: Collene Gobble, MD;  Location: [facility] ENDOSCOPY;  Service: Pulmonary;;  BRONCHIAL WASHINGS  01/07/2020   Procedure: BRONCHIAL WASHINGS;  Surgeon: Collene Gobble, MD;  Location: [facility] ENDOSCOPY;  Service: Pulmonary;;   BRONCHIAL WASHINGS  04/05/2020   Procedure: BRONCHIAL WASHINGS;  Surgeon: Rigoberto Noel, MD;  Location: [facility];  Service: Cardiopulmonary;;   COLONOSCOPY  15 years ago   HEMOSTASIS CONTROL  01/07/2020   Procedure: HEMOSTASIS CONTROL;  Surgeon: Collene Gobble, MD;  Location: [facility] ENDOSCOPY;  Service: Pulmonary;;  cold saline   IR IMAGING GUIDED PORT INSERTION  09/18/2020   NASAL TURBINATE REDUCTION  2002   Dr.Crossley   THORACENTESIS N/A 05/28/2021   Procedure: Mathews Robinsons;  Surgeon: Lanier Clam, MD;  [facility] ENDOSCOPY;  Service: Pulmonary;  Laterality: N/A;   VASECTOMY     VIDEO BRONCHOSCOPY Right 04/05/2020   Procedure: VIDEO BRONCHOSCOPY WITH FLUORO;  Surgeon: Rigoberto Noel, MD;  Location: [facility];  Service: Cardiopulmonary;  Laterality: Right;   VIDEO BRONCHOSCOPY WITH ENDOBRONCHIAL NAVIGATION N/A 01/07/2020   Procedure: VIDEO BRONCHOSCOPY WITH ENDOBRONCHIAL NAVIGATION;  Surgeon: Collene Gobble,  MD;  Location: [facility] ENDOSCOPY;  Service: Pulmonary;  Laterality: N/A;   VIDEO BRONCHOSCOPY WITH ENDOBRONCHIAL ULTRASOUND N/A 01/07/2020   Procedure: VIDEO BRONCHOSCOPY WITH ENDOBRONCHIAL ULTRASOUND;  Surgeon: Collene Gobble, MD;  Location: [facility] ENDOSCOPY;  Service: Pulmonary;  Laterality: N/A;    REVIEW OF SYSTEMS:   Review of Systems  Constitutional: Fatigue following treatment.  Positive for weight loss and decreased appetite.  Negative for chills and fever. HENT: Negative for mouth sores, nosebleeds, sore throat and trouble swallowing.   Eyes: Negative for eye problems and icterus.  Respiratory: Positive for shortness of breath and cough (improved).  Negative for hemoptysis and wheezing.   Cardiovascular: Positive for left-sided rib pain.  Negative for chest pain and leg swelling.  Gastrointestinal: Positive for occasional nausea and mild constipation.  Negative for abdominal pain, diarrhea, nausea and vomiting.  Genitourinary: Negative for bladder incontinence, difficulty urinating, dysuria, frequency and hematuria.   Musculoskeletal: Positive for low back pain.  Negative for gait problem, neck pain and neck stiffness.  Skin: Negative for itching and rash.  Neurological: Negative for dizziness, extremity weakness, gait problem, headaches, light-headedness and seizures.  Hematological: Negative for adenopathy. Does not bruise/bleed easily.  Psychiatric/Behavioral: Negative for confusion, depression and sleep disturbance. The patient is not nervous/anxious.     PHYSICAL EXAMINATION:  Blood pressure 128/78, pulse (!) 115, temperature 98 F (36.7 C), temperature source Tympanic, resp. rate 19, height 5' 9"  (1.753 m), weight 194 lb 11.2 oz (88.3 kg), SpO2 99 %.  ECOG PERFORMANCE STATUS: 1  Physical Exam  Constitutional: Oriented to person, place, and time and well-developed, well-nourished, and in no distress. No distress.  HENT:  Head: Normocephalic and atraumatic.  Mouth/Throat:  Oropharynx is clear and moist. No oropharyngeal exudate.  Eyes: Conjunctivae are normal. Right eye exhibits no discharge. Left eye exhibits no discharge. No scleral icterus.  Neck: Normal range of motion. Neck supple.  Cardiovascular: Normal rate, regular rhythm, normal heart sounds and intact distal pulses.   Pulmonary/Chest: Effort normal and breath sounds normal. No respiratory distress. No wheezes. No rales.  Abdominal: Soft. Bowel sounds are normal. Exhibits no distension and no mass. There is no tenderness.  Musculoskeletal: Normal range of motion. Exhibits no edema.  Lymphadenopathy:    No cervical adenopathy.  Neurological: Alert and oriented to person, place, and time. Exhibits normal muscle tone. Gait normal. Coordination normal.  Skin: Skin is warm and dry. No rash noted. Not diaphoretic. No  erythema. No pallor.  Psychiatric: Mood, memory and judgment normal.  Vitals reviewed.  LABORATORY DATA: Lab Results  Component Value Date   WBC 7.7 06/03/2021   HGB 10.5 (L) 06/03/2021   HCT 30.7 (L) 06/03/2021   MCV 96.2 06/03/2021   PLT 328 06/03/2021      Chemistry      Component Value Date/Time   NA 141 05/13/2021 0851   K 3.0 (L) 05/13/2021 0851   CL 107 05/13/2021 0851   CO2 27 05/13/2021 0851   BUN 8 05/13/2021 0851   CREATININE 0.94 05/13/2021 0851   CREATININE 0.67 (L) 06/24/2020 1517      Component Value Date/Time   CALCIUM 9.0 05/13/2021 0851   ALKPHOS 88 05/13/2021 0851   AST 21 05/13/2021 0851   ALT 9 05/13/2021 0851   BILITOT 0.5 05/13/2021 0851       RADIOGRAPHIC STUDIES:  DG Chest 2 View  Result Date: 05/24/2021 CLINICAL DATA:  61 year old male with a history of dyspnea on exertion Additional history of non-small cell lung carcinoma, with restaging on recent chest CT December 27th EXAM: CHEST - 2 VIEW COMPARISON:  05/10/2020 Chest CT 04/20/2021 FINDINGS: Cardiomediastinal silhouette likely unchanged with the right heart border partially obscured by  overlying lung/pleural disease. Pleuroparenchymal thickening at the periphery of the right upper lung, new from the comparison extending to the apex. No underlying bony changes. Minimal aeration of the right upper lung. Rightward shift of the tracheal air column, increased from the comparison chest x-ray. Shift of the mediastinal structures to the right, similar to the prior. Aeration of the right lung base improved from the comparison chest x-ray. Flattening of the hemidiaphragms bilaterally with blunting of the bilateral costophrenic angles. The blunting on the left with pleuroparenchymal thickening is new from the comparison chest x-ray. Pleural fluid was present on recent chest CT on the left. Reticulonodular opacities in the mid left lung are relatively similar to the prior chest x-ray. These changes project over the cardiac silhouette on the lateral view, potentially in the lingula and would correspond to changes on recent chest CT. Interval placement of right IJ port catheter IMPRESSION: Reticulonodular opacities in the left mid lung correspond to findings on recent chest CT in the lingula, potentially new infection, and may account for the new small left-sided pleural effusion. Follow-up is indicated in this patient with a history of lung cancer. Post treatment changes on the right again demonstrated with combination of pleural thickening/fluid and consolidation in the right upper lung and mixed interstitial and airspace disease in the mid and lower lung, improved from the comparison chest x-ray. Chronic rightward shift of the mediastinal structures. Interval placement of right IJ port catheter. Electronically Signed   By: Corrie Mckusick D.O.   On: 05/24/2021 16:41     ASSESSMENT/PLAN:  This is a very pleasant 61 year old Caucasian male with metastatic non-small cell lung cancer.  He was initially diagnosed as a stage IIb (T3, N0, M0) non-small cell lung cancer, adenocarcinoma.  He originally presented  with a perihilar right upper lobe lung mass and suspicious groundglass opacity in the right middle lobe.  He had evidence of metastatic disease in March/May 2022 with a biopsy-proven metastatic lesion in the right kidney as well as right pleural space, liver, thoracic/abdominal lymph nodes, and bones.  There is also a probable left-sided pleural metastasis.  He also had a small metastatic brain lesion.  The patient's PD-L1 expression is 80%.  He is negative for any actionable mutations by  guardant 360.   The patient was not a good candidate for resection according to Dr. Roxan Hockey.   Therefore, the patient underwent a course of concurrent chemoradiation with weekly carboplatin for an AUC of 2 and paclitaxel 45 mg per metered squared status post 5 cycles.  He experienced some fatigue, mild odynophagia as well as recurrent pneumonia and septicemia.  He also had radiation-induced pneumonitis.  He was admitted to the hospital several times with recurrent pneumonia.   In March 2022, the patient had a biopsy confirmed metastatic lesion to the kidney.    The patient is currently undergoing palliative systemic chemotherapy with carboplatin for an AUC of 5, Alimta 500 mg per metered squared, Keytruda 200 mg IV every 3 weeks.  He is status post 12 cycles and tolerated it fair except for fatigue 3 to 4 days following treatment as well as nausea without vomiting and decreased appetite. Starting from cycle #5, the patient has been on maintenance alimta and [place]   The patient completed palliative radiation to the painful ribs and mid thoracic metastatic bone lesions under the care of Dr. Sondra Come.  This was completed on 10/05/2020.  The patient also underwent SRS to the metastatic brain lesion under the care of Dr. Isidore Moos which was completed on 10/13/2020.    Labs were reviewed. Recommend he proceed with cycle number 13 today scheduled.  Reviewed how to take his antiemetic for optimal control.  For fatigue,  strongly encouraged the patient to increase his activity.   The patient's breathing is improved since undergoing a thoracentesis.  Advised the patient that if his fluid should reaccumulate or he develops new or worsening symptoms, he is always able to call us in the interval if he feels like he needs evaluated sooner.  The patient is presently on 10 mg of prednisone.  I have reviewed with Dr. Julien Nordmann. Dr. Julien Nordmann stated it is ok to proceed with immunotherapy since the patient is on 10 mg or less of prednisone.   The patient's nausea is controlled with his antiemetic.  Advised him to call us if he feels like he needs an additional antiemetic to better control his nausea.   He will continue to use oxycodone if needed for pain.  I have sent a refill to his pharmacy as well as lactulose.  The patient was advised to call immediately if he has any concerning symptoms in the interval. The patient voices understanding of current disease status and treatment options and is in agreement with the current care plan. All questions were answered. The patient knows to call the clinic with any problems, questions or concerns. We can certainly see the patient much sooner if necessary       Orders Placed This Encounter  Procedures   CT Chest W Contrast    Standing Status:   Future    Standing Expiration Date:   06/03/2022    Order Specific Question:   If indicated for the ordered procedure, I authorize the administration of contrast media per Radiology protocol    Answer:   Yes    Order Specific Question:   Preferred imaging location?    Answer:   [facility]   CT Abdomen Pelvis W Contrast    Standing Status:   Future    Standing Expiration Date:   06/03/2022    Order Specific Question:   If indicated for the ordered procedure, I authorize the administration of contrast media per Radiology protocol    Answer:   Yes  Order Specific Question:   Preferred imaging location?    Answer:   [facility]    Order Specific Question:   Is Oral Contrast requested for this exam?    Answer:   Yes, Per Radiology protocol     The total time spent in the appointment was 20-29 minute.   Makenzy Krist L Sharde Gover, PA-C 06/03/21

## 2021-06-03 ENCOUNTER — Inpatient Hospital Stay: Payer: BC Managed Care – PPO | Attending: Internal Medicine

## 2021-06-03 ENCOUNTER — Other Ambulatory Visit: Payer: Self-pay | Admitting: Radiation Therapy

## 2021-06-03 ENCOUNTER — Inpatient Hospital Stay: Payer: BC Managed Care – PPO

## 2021-06-03 ENCOUNTER — Other Ambulatory Visit: Payer: Self-pay

## 2021-06-03 ENCOUNTER — Inpatient Hospital Stay: Payer: BC Managed Care – PPO | Admitting: Physician Assistant

## 2021-06-03 VITALS — BP 128/78 | HR 115 | Temp 98.0°F | Resp 19 | Ht 69.0 in | Wt 194.7 lb

## 2021-06-03 VITALS — HR 99

## 2021-06-03 DIAGNOSIS — Z95828 Presence of other vascular implants and grafts: Secondary | ICD-10-CM

## 2021-06-03 DIAGNOSIS — Z5112 Encounter for antineoplastic immunotherapy: Secondary | ICD-10-CM | POA: Diagnosis present

## 2021-06-03 DIAGNOSIS — Z79899 Other long term (current) drug therapy: Secondary | ICD-10-CM | POA: Diagnosis not present

## 2021-06-03 DIAGNOSIS — C7951 Secondary malignant neoplasm of bone: Secondary | ICD-10-CM | POA: Diagnosis not present

## 2021-06-03 DIAGNOSIS — D649 Anemia, unspecified: Secondary | ICD-10-CM

## 2021-06-03 DIAGNOSIS — K5900 Constipation, unspecified: Secondary | ICD-10-CM

## 2021-06-03 DIAGNOSIS — C3491 Malignant neoplasm of unspecified part of right bronchus or lung: Secondary | ICD-10-CM

## 2021-06-03 DIAGNOSIS — C787 Secondary malignant neoplasm of liver and intrahepatic bile duct: Secondary | ICD-10-CM | POA: Insufficient documentation

## 2021-06-03 DIAGNOSIS — C772 Secondary and unspecified malignant neoplasm of intra-abdominal lymph nodes: Secondary | ICD-10-CM | POA: Insufficient documentation

## 2021-06-03 DIAGNOSIS — C7931 Secondary malignant neoplasm of brain: Secondary | ICD-10-CM | POA: Insufficient documentation

## 2021-06-03 DIAGNOSIS — C7901 Secondary malignant neoplasm of right kidney and renal pelvis: Secondary | ICD-10-CM | POA: Insufficient documentation

## 2021-06-03 DIAGNOSIS — Z5111 Encounter for antineoplastic chemotherapy: Secondary | ICD-10-CM | POA: Diagnosis not present

## 2021-06-03 DIAGNOSIS — C782 Secondary malignant neoplasm of pleura: Secondary | ICD-10-CM | POA: Insufficient documentation

## 2021-06-03 DIAGNOSIS — C342 Malignant neoplasm of middle lobe, bronchus or lung: Secondary | ICD-10-CM | POA: Insufficient documentation

## 2021-06-03 LAB — CMP (CANCER CENTER ONLY)
ALT: 11 U/L (ref 0–44)
AST: 22 U/L (ref 15–41)
Albumin: 3.2 g/dL — ABNORMAL LOW (ref 3.5–5.0)
Alkaline Phosphatase: 94 U/L (ref 38–126)
Anion gap: 5 (ref 5–15)
BUN: 11 mg/dL (ref 6–20)
CO2: 29 mmol/L (ref 22–32)
Calcium: 8.8 mg/dL — ABNORMAL LOW (ref 8.9–10.3)
Chloride: 105 mmol/L (ref 98–111)
Creatinine: 0.89 mg/dL (ref 0.61–1.24)
GFR, Estimated: >60 mL/min (ref >60)
Glucose, Bld: 96 mg/dL (ref 70–99)
Potassium: 3.4 mmol/L — ABNORMAL LOW (ref 3.5–5.1)
Sodium: 139 mmol/L (ref 135–145)
Total Bilirubin: 0.5 mg/dL (ref 0.3–1.2)
Total Protein: 6.2 g/dL — ABNORMAL LOW (ref 6.5–8.1)

## 2021-06-03 LAB — CBC WITH DIFFERENTIAL (CANCER CENTER ONLY)
Abs Immature Granulocytes: 0.04 10*3/uL (ref 0.00–0.07)
Basophils Absolute: 0 10*3/uL (ref 0.0–0.1)
Basophils Relative: 0 %
Eosinophils Absolute: 0 10*3/uL (ref 0.0–0.5)
Eosinophils Relative: 0 %
HCT: 30.7 % — ABNORMAL LOW (ref 39.0–52.0)
Hemoglobin: 10.5 g/dL — ABNORMAL LOW (ref 13.0–17.0)
Immature Granulocytes: 1 %
Lymphocytes Relative: 6 %
Lymphs Abs: 0.4 10*3/uL — ABNORMAL LOW (ref 0.7–4.0)
MCH: 32.9 pg (ref 26.0–34.0)
MCHC: 34.2 g/dL (ref 30.0–36.0)
MCV: 96.2 fL (ref 80.0–100.0)
Monocytes Absolute: 0.9 10*3/uL (ref 0.1–1.0)
Monocytes Relative: 12 %
Neutro Abs: 6.3 10*3/uL (ref 1.7–7.7)
Neutrophils Relative %: 81 %
Platelet Count: 328 10*3/uL (ref 150–400)
RBC: 3.19 MIL/uL — ABNORMAL LOW (ref 4.22–5.81)
RDW: 15.9 % — ABNORMAL HIGH (ref 11.5–15.5)
WBC Count: 7.7 10*3/uL (ref 4.0–10.5)
nRBC: 0 % (ref 0.0–0.2)

## 2021-06-03 LAB — TSH: TSH: 1.198 u[IU]/mL (ref 0.320–4.118)

## 2021-06-03 MED ORDER — SODIUM CHLORIDE 0.9% FLUSH
10.0000 mL | INTRAVENOUS | Status: DC | PRN
  Administered 2021-06-03: 10 mL

## 2021-06-03 MED ORDER — OXYCODONE-ACETAMINOPHEN 5-325 MG PO TABS: 1.0000 | ORAL_TABLET | Freq: Four times a day (QID) | ORAL | 0 refills | Status: DC | PRN

## 2021-06-03 MED ORDER — SODIUM CHLORIDE 0.9% FLUSH
10.0000 mL | INTRAVENOUS | Status: AC | PRN
  Administered 2021-06-03: 10 mL

## 2021-06-03 MED ORDER — LACTULOSE 10 GM/15ML PO SOLN: ORAL | 0 refills | Status: AC

## 2021-06-03 MED ORDER — SODIUM CHLORIDE 0.9 % IV SOLN
500.0000 mg/m2 | Freq: Once | INTRAVENOUS | Status: AC
  Administered 2021-06-03: 1000 mg via INTRAVENOUS
  Filled 2021-06-03: qty 40

## 2021-06-03 MED ORDER — HEPARIN SOD (PORK) LOCK FLUSH 100 UNIT/ML IV SOLN
500.0000 [IU] | Freq: Once | INTRAVENOUS | Status: AC | PRN
  Administered 2021-06-03: 500 [IU]

## 2021-06-03 MED ORDER — SODIUM CHLORIDE 0.9 % IV SOLN
200.0000 mg | Freq: Once | INTRAVENOUS | Status: AC
  Administered 2021-06-03: 200 mg via INTRAVENOUS
  Filled 2021-06-03: qty 200

## 2021-06-03 MED ORDER — SODIUM CHLORIDE 0.9 % IV SOLN: Freq: Once | INTRAVENOUS | Status: AC

## 2021-06-03 MED ORDER — PROCHLORPERAZINE MALEATE 10 MG PO TABS
10.0000 mg | ORAL_TABLET | Freq: Once | ORAL | Status: AC
  Administered 2021-06-03: 10 mg via ORAL
  Filled 2021-06-03: qty 1

## 2021-06-03 NOTE — Patient Instructions (Signed)
[facility] ONCOLOG[facility]  Discharge Instructions: Thank you for choosing [facility] to provide your oncology and hematology care.   If you have a lab appointment with the [facility], please go directly to the [facility] and check in at the registration area.   Wear comfortable clothing and clothing appropriate for easy access to any Portacath or PICC line.   We strive to give you quality time with your provider. You may need to reschedule your appointment if you arrive late (15 or more minutes).  Arriving late affects you and other patients whose appointments are after yours.  Also, if you miss three or more appointments without notifying the office, you may be dismissed from the clinic at the providers discretion.      For prescription refill requests, have your pharmacy contact our office and allow 72 hours for refills to be completed.    Today you received the following chemotherapy and/or immunotherapy agents: Keytruda and Alimta      To help prevent nausea and vomiting after your treatment, we encourage you to take your nausea medication as directed.  BELOW ARE SYMPTOMS THAT SHOULD BE REPORTED IMMEDIATELY: *FEVER GREATER THAN 100.4 F (38 C) OR HIGHER *CHILLS OR SWEATING *NAUSEA AND VOMITING THAT IS NOT CONTROLLED WITH YOUR NAUSEA MEDICATION *UNUSUAL SHORTNESS OF BREATH *UNUSUAL BRUISING OR BLEEDING *URINARY PROBLEMS (pain or burning when urinating, or frequent urination) *BOWEL PROBLEMS (unusual diarrhea, constipation, pain near the anus) TENDERNESS IN MOUTH AND THROAT WITH OR WITHOUT PRESENCE OF ULCERS (sore throat, sores in mouth, or a toothache) UNUSUAL RASH, SWELLING OR PAIN  UNUSUAL VAGINAL DISCHARGE OR ITCHING   Items with * indicate a potential emergency and should be followed up as soon as possible or go to the Emergency Department if any problems should occur.  Please show the CHEMOTHERAPY ALERT CARD or IMMUNOTHERAPY ALERT CARD at  check-in to the Emergency Department and triage nurse.  Should you have questions after your visit or need to cancel or reschedule your appointment, please contact [facility]  Dept: [phone]  and follow the prompts.  Office hours are 8:00 a.m. to 4:30 p.m. Monday - Friday. Please note that voicemails left after 4:00 p.m. may not be returned until the following business day.  We are closed weekends and major holidays. You have access to a nurse at all times for urgent questions. Please call the main number to the clinic Dept: [phone] and follow the prompts.   For any non-urgent questions, you may also contact your provider using MyChart. We now offer e-Visits for anyone 10 and older to request care online for non-urgent symptoms. For details visit [email or web address].   Also download the MyChart app! Go to the app store, search "MyChart", open the app, select [facility], and log in with your MyChart username and password.  Due to Covid, a mask is required upon entering the hospital/clinic. If you do not have a mask, one will be given to you upon arrival. For doctor visits, patients may have 1 support person aged 57 or older with them. For treatment visits, patients cannot have anyone with them due to current Covid guidelines and our immunocompromised population.

## 2021-06-04 ENCOUNTER — Telehealth: Payer: Self-pay

## 2021-06-04 NOTE — Telephone Encounter (Signed)
Theatre stage manager, Tourist information centre manager, called advising she is with [employer]. She states Case Management services were offered to the pt and he declined but wanted to provider her information in the event we feel the pt needs their services in the furture.  Cassie Freer, Case Manager [phone] EXT: [phone]

## 2021-06-07 ENCOUNTER — Encounter: Payer: Self-pay | Admitting: Licensed Clinical Social Worker

## 2021-06-07 NOTE — Progress Notes (Signed)
[facility] CSW Progress Note  Clinical Education officer, museum received application for [employer] gas card program from pt. CSW submitted to [facility] today. They will notify pt if approved.    Christeen Douglas , LCSW

## 2021-06-09 ENCOUNTER — Encounter: Payer: Self-pay | Admitting: Radiation Therapy

## 2021-06-09 ENCOUNTER — Encounter: Payer: Self-pay | Admitting: Licensed Clinical Social Worker

## 2021-06-09 NOTE — Progress Notes (Signed)
[facility] CSW Progress Note  Clinical Education officer, museum received notice from [employer] that pt was approved for gas cards. CSW notified pt by phone that the cards are in the mail.    Christeen Douglas , LCSW

## 2021-06-09 NOTE — Progress Notes (Signed)
Written reminder card including my contact information was mailed out to pt. regarding his April Brain MRI and follow-up with Dr. Elwin Sleight, to review the results.   Mont Dutton R.T.(R)(T) Radiation Special Procedures Navigator

## 2021-06-11 ENCOUNTER — Ambulatory Visit: Payer: Self-pay | Admitting: Family Medicine

## 2021-06-18 ENCOUNTER — Encounter: Payer: Self-pay | Admitting: Family Medicine

## 2021-06-18 ENCOUNTER — Other Ambulatory Visit: Payer: Self-pay

## 2021-06-18 ENCOUNTER — Ambulatory Visit (INDEPENDENT_AMBULATORY_CARE_PROVIDER_SITE_OTHER): Payer: BC Managed Care – PPO | Admitting: Family Medicine

## 2021-06-18 DIAGNOSIS — F411 Generalized anxiety disorder: Secondary | ICD-10-CM | POA: Diagnosis not present

## 2021-06-18 DIAGNOSIS — D496 Neoplasm of unspecified behavior of brain: Secondary | ICD-10-CM | POA: Insufficient documentation

## 2021-06-18 DIAGNOSIS — I10 Essential (primary) hypertension: Secondary | ICD-10-CM

## 2021-06-18 DIAGNOSIS — C3491 Malignant neoplasm of unspecified part of right bronchus or lung: Secondary | ICD-10-CM | POA: Diagnosis not present

## 2021-06-18 MED ORDER — LORAZEPAM 1 MG PO TABS: 1.0000 mg | ORAL_TABLET | Freq: Three times a day (TID) | ORAL | 5 refills | Status: AC | PRN

## 2021-06-20 ENCOUNTER — Other Ambulatory Visit: Payer: Self-pay | Admitting: Pulmonary Disease

## 2021-06-20 ENCOUNTER — Other Ambulatory Visit: Payer: Self-pay | Admitting: Internal Medicine

## 2021-06-20 DIAGNOSIS — C3491 Malignant neoplasm of unspecified part of right bronchus or lung: Secondary | ICD-10-CM

## 2021-06-20 NOTE — Assessment & Plan Note (Signed)
Stable with lorazepam as needed at current strength.  We will continue.  He is aware to avoid using this close to using oxycodone.

## 2021-06-20 NOTE — Assessment & Plan Note (Signed)
Blood pressure stable at this time.

## 2021-06-20 NOTE — Progress Notes (Signed)
Richard Li - 61 y.o. male MRN [id number]  Date of birth: 11/07/1960  Subjective Chief Complaint  Patient presents with   Follow-up   GAD    HPI Richard Li is a 61 year old male here today for follow-up visit.  Continues to receive treatment for stage IV lung cancer.  He has become a little less intolerant of treatments recently.  He remains hopeful regarding this.  Continues to use lorazepam as needed for anxiety.  This continues to work well for him.  He is prescribed oxycodone which he takes at night occasionally.  He does try to avoid this with his lorazepam.  ROS:  A comprehensive ROS was completed and negative except as noted per HPI    Past Medical History:  Diagnosis Date   Allergic rhinitis, cause unspecified    Anxiety state, unspecified    Asthma    as a child   Chronic airway obstruction, not elsewhere classified    Esophageal reflux    History of kidney stones    History of radiation therapy 02/13/2020-03/24/2020   right lung        Dr Gery Pray   Hypertension    Lumbago    lung ca dx'd 12/2019   Other and unspecified hyperlipidemia    Other chest pain     Past Surgical History:  Procedure Laterality Date   APPENDECTOMY     BRONCHIAL BIOPSY  01/07/2020   Procedure: BRONCHIAL BIOPSIES;  Surgeon: Collene Gobble, MD;  Location: [place];  Service: Pulmonary;;   BRONCHIAL BIOPSY  04/05/2020   Procedure: BRONCHIAL BIOPSIES;  Surgeon: Rigoberto Noel, MD;  Location: [place];  Service: Cardiopulmonary;;   BRONCHIAL BRUSHINGS  01/07/2020   Procedure: BRONCHIAL BRUSHINGS;  Surgeon: Collene Gobble, MD;  Location: [facility] ENDOSCOPY;  Service: Pulmonary;;   BRONCHIAL NEEDLE ASPIRATION BIOPSY  01/07/2020   Procedure: BRONCHIAL NEEDLE ASPIRATION BIOPSIES;  Surgeon: Collene Gobble, MD;  Location: [place];  Service: Pulmonary;;   BRONCHIAL WASHINGS  01/07/2020   Procedure: BRONCHIAL WASHINGS;  Surgeon: Collene Gobble, MD;  Location: [facility] ENDOSCOPY;  Service:  Pulmonary;;   BRONCHIAL WASHINGS  04/05/2020   Procedure: BRONCHIAL WASHINGS;  Surgeon: Rigoberto Noel, MD;  Location: [place];  Service: Cardiopulmonary;;   COLONOSCOPY  15 years ago   HEMOSTASIS CONTROL  01/07/2020   Procedure: HEMOSTASIS CONTROL;  Surgeon: Collene Gobble, MD;  Location: [facility] ENDOSCOPY;  Service: Pulmonary;;  cold saline   IR IMAGING GUIDED PORT INSERTION  09/18/2020   NASAL TURBINATE REDUCTION  2002   Dr.Crossley   THORACENTESIS N/A 05/28/2021   Procedure: Mathews Robinsons;  Surgeon: Lanier Clam, MD;  Location: [facility] ENDOSCOPY;  Service: Pulmonary;  Laterality: N/A;   VASECTOMY     VIDEO BRONCHOSCOPY Right 04/05/2020   Procedure: VIDEO BRONCHOSCOPY WITH FLUORO;  Surgeon: Rigoberto Noel, MD;  Location: [place];  Service: Cardiopulmonary;  Laterality: Right;   VIDEO BRONCHOSCOPY WITH ENDOBRONCHIAL NAVIGATION N/A 01/07/2020   Procedure: VIDEO BRONCHOSCOPY WITH ENDOBRONCHIAL NAVIGATION;  Surgeon: Collene Gobble, MD;  Location: [place] ENDOSCOPY;  Service: Pulmonary;  Laterality: N/A;   VIDEO BRONCHOSCOPY WITH ENDOBRONCHIAL ULTRASOUND N/A 01/07/2020   Procedure: VIDEO BRONCHOSCOPY WITH ENDOBRONCHIAL ULTRASOUND;  Surgeon: Collene Gobble, MD;  Location: [place] ENDOSCOPY;  Service: Pulmonary;  Laterality: N/A;    Social History   Socioeconomic History   Marital status: Married    Spouse name: Designer, industrial/product   Number of children: Not on file   Years of education: Not on  file   Highest education level: Not on file  Occupational History   Occupation: truck driver    Employer: [employer]  Tobacco Use   Smoking status: Former    Packs/day: 2.00    Years: 28.00    Pack years: 56.00    Types: Cigarettes    Quit date: 04/25/2005    Years since quitting: 16.1   Smokeless tobacco: Never  Vaping Use   Vaping Use: Never used  Substance and Sexual Activity   Alcohol use: Yes    Alcohol/week: 12.0 standard drinks    Types: 12 Cans of beer per week    Comment: social use    Drug use: No   Sexual activity: Not on file  Other Topics Concern   Not on file  Social History Narrative   Not on file   Social Determinants of Health   Financial Resource Strain: Not on file  Food Insecurity: Not on file  Transportation Needs: Not on file  Physical Activity: Not on file  Stress: Not on file  Social Connections: Not on file    Family History  Problem Relation Age of Onset   Breast cancer Mother    Cancer Father    Heart attack Brother    Emphysema Maternal Uncle    COPD Maternal Uncle    Heart disease Maternal Uncle    Colon cancer Neg Hx     Health Maintenance  Topic Date Due   COVID-19 Vaccine (1) Never done   Hepatitis C Screening  Never done   Zoster Vaccines- Shingrix (1 of 2) Never done   TETANUS/TDAP  12/11/2021   COLONOSCOPY (Pts 45-28yrs Insurance coverage will need to be confirmed)  04/23/2023   INFLUENZA VACCINE  Completed   HIV Screening  Completed   HPV VACCINES  Aged Out     ----------------------------------------------------------------------------------------------------------------------------------------------------------------------------------------------------------------- Physical Exam BP 117/68    Pulse 98    Ht 5\' 9"  (1.753 m)    Wt 192 lb (87.1 kg)    SpO2 98%    BMI 28.35 kg/m   Physical Exam Constitutional:      Appearance: Normal appearance.  Cardiovascular:     Rate and Rhythm: Normal rate and regular rhythm.  Pulmonary:     Effort: Pulmonary effort is normal.     Breath sounds: Normal breath sounds.  Musculoskeletal:     Cervical back: Neck supple.  Neurological:     Mental Status: He is alert.  Psychiatric:        Mood and Affect: Mood normal.        Behavior: Behavior normal.    ------------------------------------------------------------------------------------------------------------------------------------------------------------------------------------------------------------------- Assessment and  Plan  Essential hypertension Blood pressure stable at this time.  Adenocarcinoma of right lung, stage 4 ([place]) Management per oncology.  Anxiety state Stable with lorazepam as needed at current strength.  We will continue.  He is aware to avoid using this close to using oxycodone.   Meds ordered this encounter  Medications   LORazepam (ATIVAN) 1 MG tablet    Sig: Take 1 tablet (1 mg total) by mouth every 8 (eight) hours as needed for anxiety.    Dispense:  90 tablet    Refill:  5    Return in about 6 months (around 12/16/2021) for GAD.    This visit occurred during the SARS-CoV-2 public health emergency.  Safety protocols were in place, including screening questions prior to the visit, additional usage of staff PPE, and extensive cleaning of exam room while observing appropriate contact  time as indicated for disinfecting solutions.  Ladies

## 2021-06-20 NOTE — Assessment & Plan Note (Signed)
Management per oncology.

## 2021-06-21 ENCOUNTER — Other Ambulatory Visit: Payer: Self-pay

## 2021-06-21 ENCOUNTER — Ambulatory Visit (HOSPITAL_COMMUNITY)
Admission: RE | Admit: 2021-06-21 | Discharge: 2021-06-21 | Disposition: A | Discharge status: Home / Self Care | Payer: BC Managed Care – PPO | Source: Ambulatory Visit | Attending: Physician Assistant | Admitting: Physician Assistant

## 2021-06-21 ENCOUNTER — Encounter (HOSPITAL_COMMUNITY): Payer: Self-pay

## 2021-06-21 ENCOUNTER — Encounter: Payer: Self-pay | Admitting: Internal Medicine

## 2021-06-21 DIAGNOSIS — C3491 Malignant neoplasm of unspecified part of right bronchus or lung: Secondary | ICD-10-CM | POA: Diagnosis not present

## 2021-06-21 IMAGING — CT CT ABD-PELV W/ CM
2 of 5 series · 12 of 36 positions shown, 15 images · IV contrast (agent unspecified)
Comparison: CT scan [DATE]

CLINICAL DATA: Restaging non-small cell lung cancer.

EXAM:
CT CHEST, ABDOMEN, AND PELVIS WITH CONTRAST
TECHNIQUE: Multidetector CT imaging of the chest, abdomen and pelvis was
performed following the standard protocol during bolus
administration of intravenous contrast.

[Series 2: cap with · axial · 0.80mm/px · z∈[-653,-93]mm · 9 of 142 slices shown, 12 images]
[im 15/142  mediastinal]
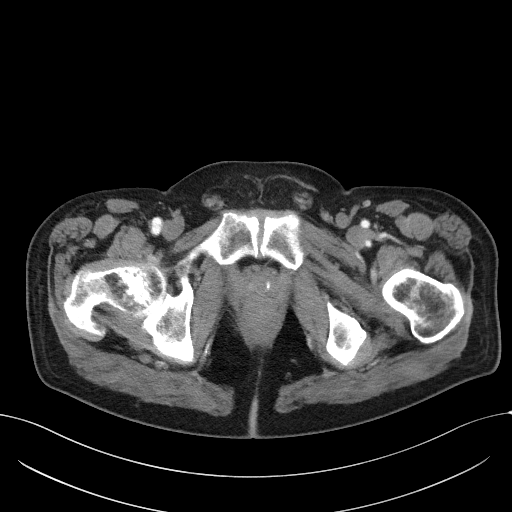
[im 15/142  lung]
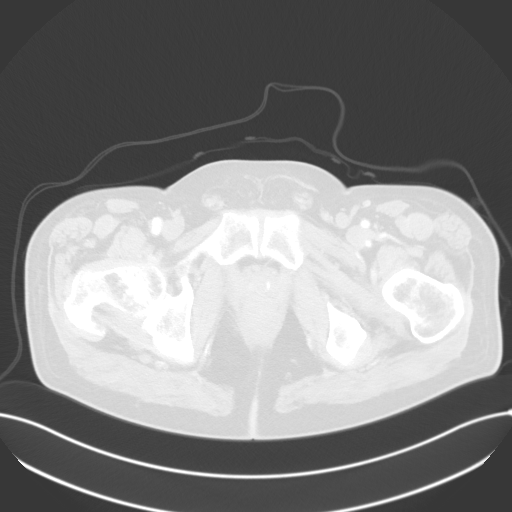
[im 29/142  lung]
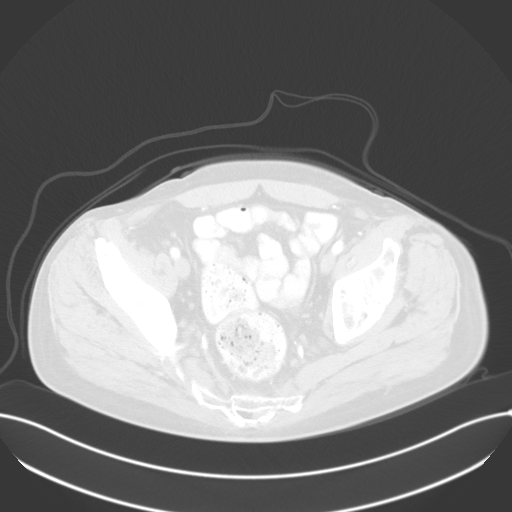
[im 43/142  lung]
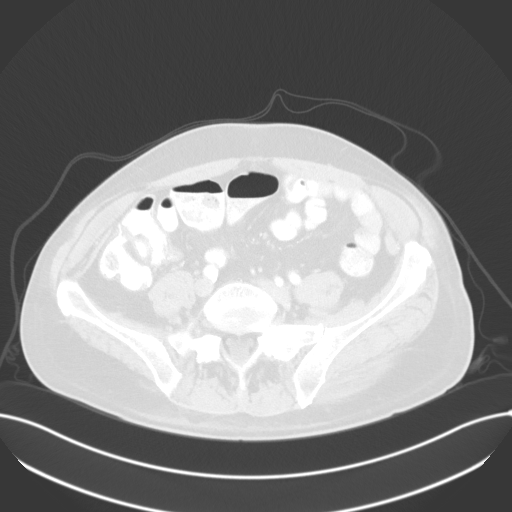
[im 57/142  lung]
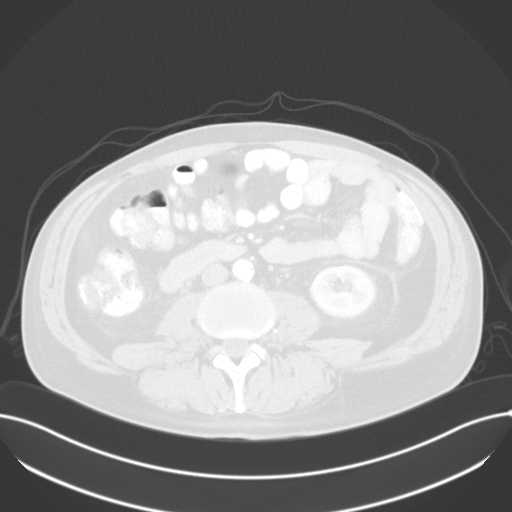
[im 71/142  mediastinal]
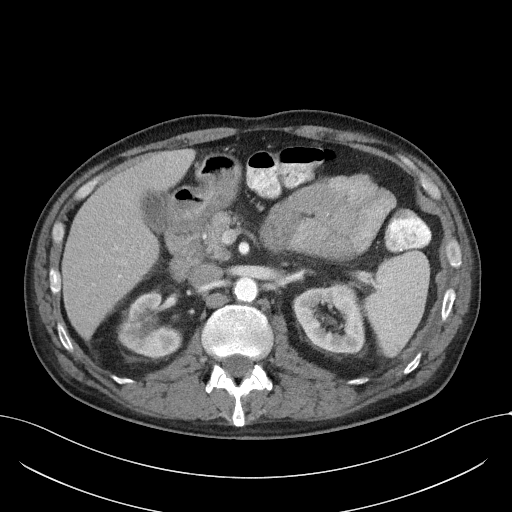
[im 71/142  lung]
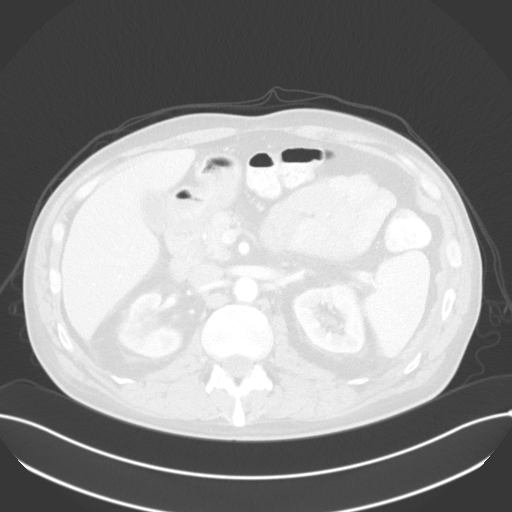
[im 85/142  lung]
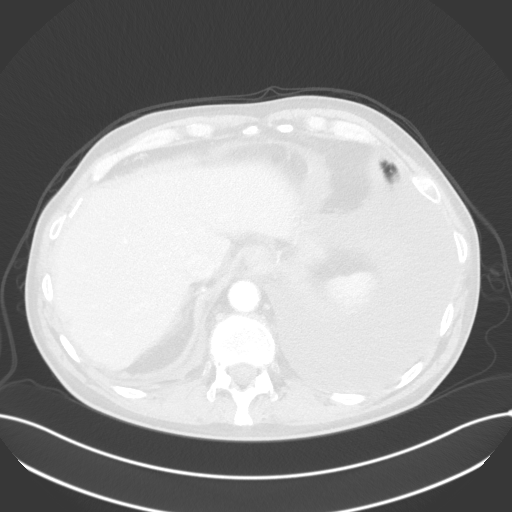
[im 99/142  lung]
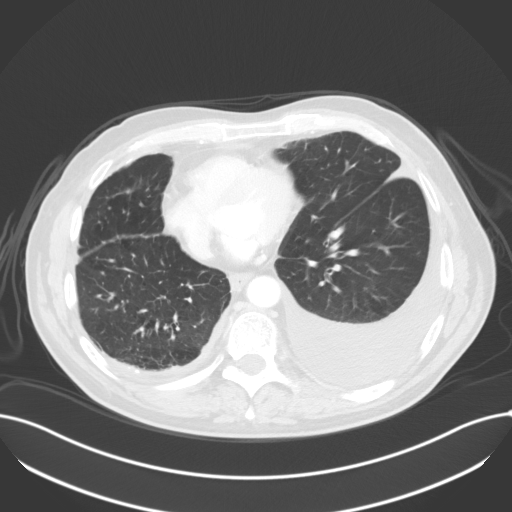
[im 113/142  lung]
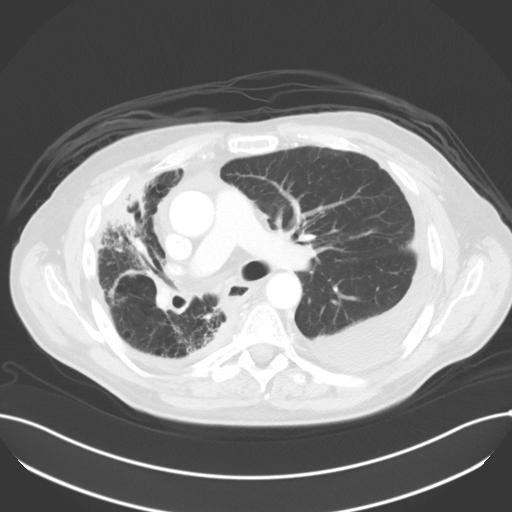
[im 127/142  mediastinal]
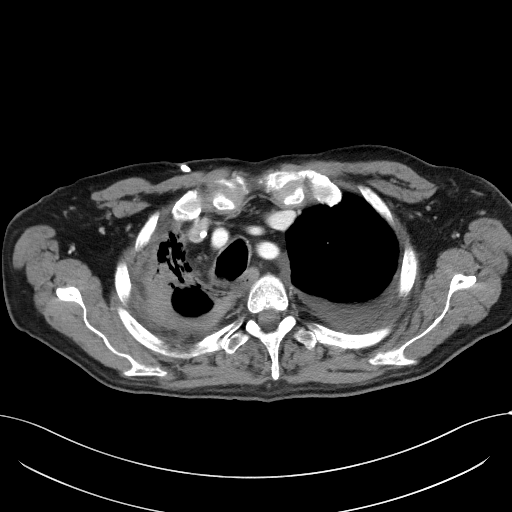
[im 127/142  lung]
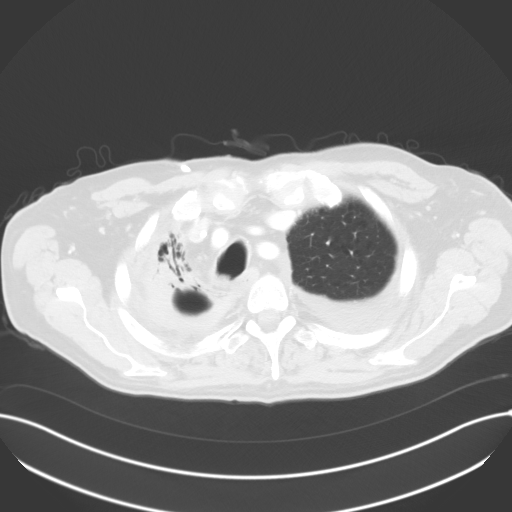

[Series 4: coronals · coronal · 0.94mm/px · 3 of 146 slices shown]
[im 30/146  lung]
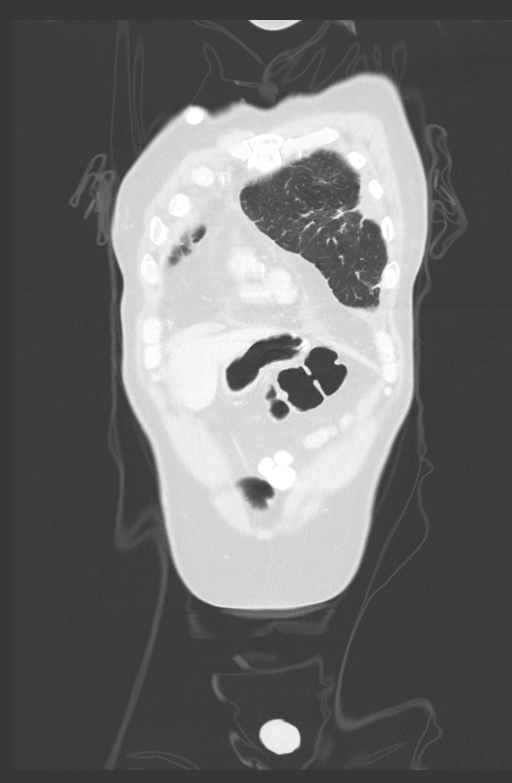
[im 59/146  lung]
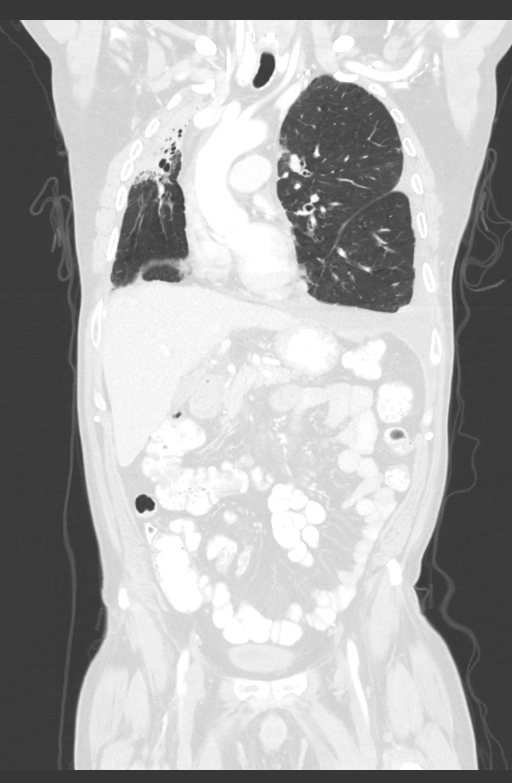
[im 88/146  lung]
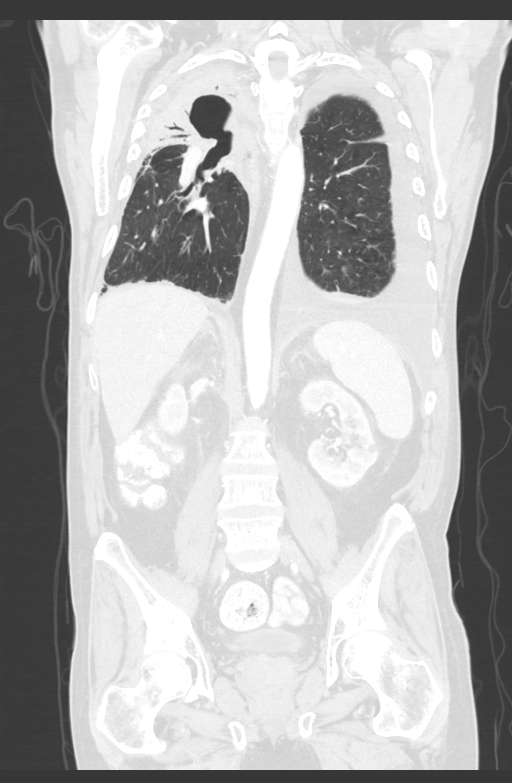

[12 of 36 positions shown; findings below may reference images not displayed]

RADIATION DOSE REDUCTION: This exam was performed according to the
departmental dose-optimization program which includes automated
exposure control, adjustment of the mA and/or kV according to
patient size and/or use of iterative reconstruction technique.

CONTRAST:  100mL OMNIPAQUE IOHEXOL 300 MG/ML  SOLN
FINDINGS: CT CHEST FINDINGS

Cardiovascular: The heart is normal in size. No pericardial
effusion. The aorta is normal in caliber. No dissection. Stable
scattered atherosclerotic calcifications.

Mediastinum/Nodes: No mediastinal or hilar mass or lymphadenopathy
the. Small scattered lymph nodes are stable. The Port-A-Cath is
stable. The esophagus is grossly normal.

Lungs/Pleura: Stable extensive volume loss in right hemithorax with
dense apical scarring changes and bronchiectasis likely related to
radiation. No obvious findings to suggest recurrent tumor.

Enlarging left pleural effusion with overlying atelectasis. Stable
small right pleural effusion. Stable underlying emphysematous
changes pulmonary scarring.

Stable upper lobe scarring changes on the left side with vague
nodularity. Stable 6 mm left lower lobe pulmonary nodule on image
99/6

No new or progressive findings.

Musculoskeletal: No significant bony findings.

CT ABDOMEN PELVIS FINDINGS

Hepatobiliary: No hepatic lesions or intrahepatic biliary
dilatation. Gallbladder is unremarkable. No common bile duct
dilatation.

Pancreas: No mass, inflammation or ductal dilatation.

Spleen: Normal size.  No focal lesions.

Adrenals/Urinary Tract: Adrenal glands are unremarkable stable.
Stable ill-defined area of cortical thinning and hypodensity
associated with the right kidney. No discrete mass. This is likely
an area remote post inflammatory or infectious change. No new or
progressive findings. The bladder is unremarkable.

Stomach/Bowel: The stomach, duodenum, small bowel and colon are
unremarkable.

Vascular/Lymphatic: Stable vascular calcifications. No mesenteric or
retroperitoneal adenopathy.

Reproductive: The prostate gland and seminal vesicles are
unremarkable.

Other: No pelvic mass or adenopathy. No free pelvic fluid
collections. No inguinal mass or adenopathy. No abdominal wall
hernia or subcutaneous lesions.

Musculoskeletal: No acute bony findings. Stable T12 compression
fracture. Stable mild sclerotic changes associated with the S1
vertebral body and also the left pedicle T9. Stable scattered
sclerotic pelvic lesions consistent with treated metastasis.
IMPRESSION: 1. Stable extensive volume loss in the right hemithorax with dense
apical scarring changes and bronchiectasis likely related to
radiation. No obvious findings to suggest recurrent tumor.
2. Enlarging left pleural effusion with overlying atelectasis.
3. Stable small right pleural effusion.
4. Stable 6 mm left lower lobe pulmonary nodule.
5. Stable emphysematous changes and pulmonary scarring.
6. No findings for abdominal/pelvic metastatic disease.
7. Stable scattered sclerotic/treated bone metastasis.

Emphysema ([1F]-[1F]).

## 2021-06-21 MED ORDER — SODIUM CHLORIDE (PF) 0.9 % IJ SOLN
INTRAMUSCULAR | Status: AC
  Filled 2021-06-21: qty 50

## 2021-06-21 MED ORDER — IOHEXOL 300 MG/ML  SOLN
100.0000 mL | Freq: Once | INTRAMUSCULAR | Status: AC | PRN
  Administered 2021-06-21: 100 mL via INTRAVENOUS

## 2021-06-22 NOTE — Telephone Encounter (Signed)
Dr. Hunsucker, please advise on refill request. °

## 2021-06-24 ENCOUNTER — Other Ambulatory Visit: Payer: BC Managed Care – PPO

## 2021-06-24 ENCOUNTER — Inpatient Hospital Stay: Payer: BC Managed Care – PPO | Attending: Internal Medicine | Admitting: Internal Medicine

## 2021-06-24 ENCOUNTER — Other Ambulatory Visit: Payer: Self-pay

## 2021-06-24 ENCOUNTER — Inpatient Hospital Stay: Payer: BC Managed Care – PPO

## 2021-06-24 VITALS — BP 110/68 | HR 94 | Temp 98.0°F | Resp 19 | Ht 69.0 in | Wt 191.4 lb

## 2021-06-24 DIAGNOSIS — C3491 Malignant neoplasm of unspecified part of right bronchus or lung: Secondary | ICD-10-CM | POA: Diagnosis present

## 2021-06-24 DIAGNOSIS — Z79899 Other long term (current) drug therapy: Secondary | ICD-10-CM | POA: Diagnosis not present

## 2021-06-24 DIAGNOSIS — Z5111 Encounter for antineoplastic chemotherapy: Secondary | ICD-10-CM | POA: Diagnosis present

## 2021-06-24 DIAGNOSIS — C7901 Secondary malignant neoplasm of right kidney and renal pelvis: Secondary | ICD-10-CM | POA: Diagnosis not present

## 2021-06-24 DIAGNOSIS — J90 Pleural effusion, not elsewhere classified: Secondary | ICD-10-CM | POA: Diagnosis not present

## 2021-06-24 DIAGNOSIS — C7951 Secondary malignant neoplasm of bone: Secondary | ICD-10-CM | POA: Insufficient documentation

## 2021-06-24 DIAGNOSIS — C772 Secondary and unspecified malignant neoplasm of intra-abdominal lymph nodes: Secondary | ICD-10-CM | POA: Insufficient documentation

## 2021-06-24 DIAGNOSIS — C787 Secondary malignant neoplasm of liver and intrahepatic bile duct: Secondary | ICD-10-CM | POA: Diagnosis not present

## 2021-06-24 DIAGNOSIS — Z5112 Encounter for antineoplastic immunotherapy: Secondary | ICD-10-CM | POA: Insufficient documentation

## 2021-06-24 DIAGNOSIS — C782 Secondary malignant neoplasm of pleura: Secondary | ICD-10-CM | POA: Diagnosis not present

## 2021-06-24 DIAGNOSIS — E876 Hypokalemia: Secondary | ICD-10-CM | POA: Diagnosis not present

## 2021-06-24 DIAGNOSIS — C342 Malignant neoplasm of middle lobe, bronchus or lung: Secondary | ICD-10-CM | POA: Diagnosis not present

## 2021-06-24 DIAGNOSIS — C7931 Secondary malignant neoplasm of brain: Secondary | ICD-10-CM | POA: Diagnosis not present

## 2021-06-24 DIAGNOSIS — C3412 Malignant neoplasm of upper lobe, left bronchus or lung: Secondary | ICD-10-CM | POA: Insufficient documentation

## 2021-06-24 DIAGNOSIS — E860 Dehydration: Secondary | ICD-10-CM

## 2021-06-24 DIAGNOSIS — C3490 Malignant neoplasm of unspecified part of unspecified bronchus or lung: Secondary | ICD-10-CM

## 2021-06-24 DIAGNOSIS — D649 Anemia, unspecified: Secondary | ICD-10-CM

## 2021-06-24 LAB — CBC WITH DIFFERENTIAL (CANCER CENTER ONLY)
Abs Immature Granulocytes: 0.02 10*3/uL (ref 0.00–0.07)
Basophils Absolute: 0 10*3/uL (ref 0.0–0.1)
Basophils Relative: 1 %
Eosinophils Absolute: 0 10*3/uL (ref 0.0–0.5)
Eosinophils Relative: 1 %
HCT: 29 % — ABNORMAL LOW (ref 39.0–52.0)
Hemoglobin: 9.7 g/dL — ABNORMAL LOW (ref 13.0–17.0)
Immature Granulocytes: 0 %
Lymphocytes Relative: 7 %
Lymphs Abs: 0.4 10*3/uL — ABNORMAL LOW (ref 0.7–4.0)
MCH: 32.9 pg (ref 26.0–34.0)
MCHC: 33.4 g/dL (ref 30.0–36.0)
MCV: 98.3 fL (ref 80.0–100.0)
Monocytes Absolute: 0.9 10*3/uL (ref 0.1–1.0)
Monocytes Relative: 14 %
Neutro Abs: 5 10*3/uL (ref 1.7–7.7)
Neutrophils Relative %: 77 %
Platelet Count: 327 10*3/uL (ref 150–400)
RBC: 2.95 MIL/uL — ABNORMAL LOW (ref 4.22–5.81)
RDW: 16.1 % — ABNORMAL HIGH (ref 11.5–15.5)
WBC Count: 6.5 10*3/uL (ref 4.0–10.5)
nRBC: 0 % (ref 0.0–0.2)

## 2021-06-24 LAB — CMP (CANCER CENTER ONLY)
ALT: 7 U/L (ref 0–44)
AST: 19 U/L (ref 15–41)
Albumin: 3.1 g/dL — ABNORMAL LOW (ref 3.5–5.0)
Alkaline Phosphatase: 98 U/L (ref 38–126)
Anion gap: 6 (ref 5–15)
BUN: 9 mg/dL (ref 6–20)
CO2: 27 mmol/L (ref 22–32)
Calcium: 9.1 mg/dL (ref 8.9–10.3)
Chloride: 105 mmol/L (ref 98–111)
Creatinine: 0.96 mg/dL (ref 0.61–1.24)
GFR, Estimated: >60 mL/min (ref >60)
Glucose, Bld: 104 mg/dL — ABNORMAL HIGH (ref 70–99)
Potassium: 3.1 mmol/L — ABNORMAL LOW (ref 3.5–5.1)
Sodium: 138 mmol/L (ref 135–145)
Total Bilirubin: 0.5 mg/dL (ref 0.3–1.2)
Total Protein: 6.3 g/dL — ABNORMAL LOW (ref 6.5–8.1)

## 2021-06-24 MED ORDER — SODIUM CHLORIDE 0.9% FLUSH
10.0000 mL | INTRAVENOUS | Status: DC | PRN
  Administered 2021-06-24: 10 mL

## 2021-06-24 MED ORDER — PROCHLORPERAZINE MALEATE 10 MG PO TABS
10.0000 mg | ORAL_TABLET | Freq: Once | ORAL | Status: AC
  Administered 2021-06-24: 10 mg via ORAL
  Filled 2021-06-24: qty 1

## 2021-06-24 MED ORDER — SODIUM CHLORIDE 0.9% FLUSH
10.0000 mL | Freq: Once | INTRAVENOUS | Status: AC | PRN
  Administered 2021-06-24: 10 mL

## 2021-06-24 MED ORDER — SODIUM CHLORIDE 0.9 % IV SOLN
500.0000 mg/m2 | Freq: Once | INTRAVENOUS | Status: AC
  Administered 2021-06-24: 1000 mg via INTRAVENOUS
  Filled 2021-06-24: qty 40

## 2021-06-24 MED ORDER — HEPARIN SOD (PORK) LOCK FLUSH 100 UNIT/ML IV SOLN
500.0000 [IU] | Freq: Once | INTRAVENOUS | Status: AC | PRN
  Administered 2021-06-24: 500 [IU]

## 2021-06-24 MED ORDER — SODIUM CHLORIDE 0.9 % IV SOLN
200.0000 mg | Freq: Once | INTRAVENOUS | Status: AC
  Administered 2021-06-24: 200 mg via INTRAVENOUS
  Filled 2021-06-24: qty 200

## 2021-06-24 MED ORDER — SODIUM CHLORIDE 0.9 % IV SOLN: Freq: Once | INTRAVENOUS | Status: AC

## 2021-06-24 MED ORDER — POTASSIUM CHLORIDE CRYS ER 20 MEQ PO TBCR: 20.0000 meq | EXTENDED_RELEASE_TABLET | Freq: Every day | ORAL | 0 refills | Status: DC

## 2021-06-24 NOTE — Progress Notes (Signed)
[place] [phone]   [phone]  OFFICE PROGRESS NOTE  [name], DO [place]  [address] [place] [address]  DIAGNOSIS: Metastatic non-small cell lung cancer initially diagnosed as stage IIB (T3, N0, M0) non-small cell lung cancer, adenocarcinoma presented with large central perihilar mass with suspicious groundglass opacity in the right middle lobe and left upper lobe diagnosed in September 2021.  The patient had evidence of metastatic disease to the right kidney in March 2022.  Molecular studies by guardant 360: No actionable mutations.   PDl1: 80%   PRIOR THERAPY: Concurrent chemoradiation with weekly carboplatin for AUC of 2 and paclitaxel 45 mg/M2.  First dose February 10, 2020.  Status post 5 cycles.   CURRENT THERAPY:  1) Palliative systemic chemotherapy with carboplatin AUC of 5, Alimta 500 mg/M2, Keytruda 200 mg IV every 3 weeks.  First dose expected on 09/24/2020. Status post 13 cycles.  Starting from cycle #5 the patient is on maintenance treatment with Alimta and Keytruda every 3 weeks. 2) Palliative radiation to the left anterior rib cage area as well as the mid back area under the care of Dr. Sondra Come. Last treatment on 10/05/20.  INTERVAL HISTORY: Richard Li 61 y.o. male returns to the clinic today for follow-up visit.  The patient is feeling fine today with no concerning complaints except for the shortness of breath with exertion and intermittent left-sided chest pain.  He denied having any cough or hemoptysis.  The patient has no nausea, vomiting, diarrhea or constipation.  He has no headache or visual changes.  He has no weight loss or night sweats.  He continues to tolerate his treatment with maintenance chemotherapy fairly well. He is here today for evaluation with repeat CT scan of the chest, abdomen and pelvis for restaging of his disease.    MEDICAL HISTORY: Past Medical History:  Diagnosis Date    Allergic rhinitis, cause unspecified    Anxiety state, unspecified    Asthma    as a child   Chronic airway obstruction, not elsewhere classified    Esophageal reflux    History of kidney stones    History of radiation therapy 02/13/2020-03/24/2020   right lung        Dr Gery Pray   Hypertension    Lumbago    lung ca dx'd 12/2019   Other and unspecified hyperlipidemia    Other chest pain     ALLERGIES:  is allergic to penicillins and amoxicillin.  MEDICATIONS:  Current Outpatient Medications  Medication Sig Dispense Refill   doxycycline (VIBRA-TABS) 100 MG tablet Take 1 tablet by mouth twice daily 60 tablet 3   acetaminophen (TYLENOL) 500 MG tablet Take 1,000 mg by mouth every 8 (eight) hours as needed for moderate pain or mild pain.     COMBIVENT RESPIMAT 20-100 MCG/ACT AERS respimat INHALE 1 TO 2 PUFFS BY MOUTH EVERY 6 HOURS AS NEEDED FOR WHEEZING 4 g 5   dextromethorphan-guaiFENesin (MUCINEX DM) 30-600 MG 12hr tablet Take 1 tablet by mouth 2 (two) times daily.     feeding supplement (ENSURE ENLIVE / ENSURE PLUS) LIQD Take 237 mLs by mouth 3 (three) times daily between meals. (Patient taking differently: Take 237 mLs by mouth daily.) 237 mL 12   fenofibrate 160 MG tablet Take 1 tablet by mouth once daily 90 tablet 0   ferrous sulfate 325 (65 FE) MG tablet Take 1 tablet (325 mg total) by mouth  with breakfast. 30 tablet 0  ° folic acid (FOLVITE) 1 MG tablet Take 1 tablet by mouth once daily 30 tablet 0  ° lactulose (CHRONULAC) 10 GM/15ML solution TAKE 15 MLS BY MOUTH 3 TIMES DAILY Strength: 10 GM/15ML 236 mL 0  ° lidocaine-prilocaine (EMLA) cream Apply 1 application topically as needed. 30 g 2  ° LORazepam (ATIVAN) 1 MG tablet Take 1 tablet (1 mg total) by mouth every 8 (eight) hours as needed for anxiety. 90 tablet 5  ° Multiple Vitamin (MULTIVITAMIN WITH MINERALS) TABS tablet Take 1 tablet by mouth daily.    ° ondansetron (ZOFRAN) 8 MG tablet Take 1 tablet (8 mg total) by mouth  every 8 (eight) hours as needed for nausea or vomiting. Starting day 3 after chemotherapy 30 tablet 2  ° oxyCODONE-acetaminophen (PERCOCET/ROXICET) 5-325 MG tablet Take 1 tablet by mouth every 6 (six) hours as needed for severe pain. 30 tablet 0  ° potassium chloride SA (KLOR-CON M) 20 MEQ tablet Take 1 tablet (20 mEq total) by mouth daily. 7 tablet 0  ° predniSONE (DELTASONE) 10 MG tablet Take 1 tablet (10 mg total) by mouth daily with breakfast. 90 tablet 3  ° prochlorperazine (COMPAZINE) 10 MG tablet Take 1 tablet (10 mg total) by mouth every 6 (six) hours as needed. 30 tablet 2  ° °No current facility-administered medications for this visit.  ° °Facility-Administered Medications Ordered in Other Visits  °Medication Dose Route Frequency Provider Last Rate Last Admin  ° 0.9 %  sodium chloride infusion   Intravenous Continuous Mohamed, Mohamed, MD   Stopped at 03/05/20 1410  ° ° °SURGICAL HISTORY:  °Past Surgical History:  °Procedure Laterality Date  ° APPENDECTOMY    ° BRONCHIAL BIOPSY  01/07/2020  ° Procedure: BRONCHIAL BIOPSIES;  Surgeon: Byrum, Robert S, MD;  Location: [facility];  Service: Pulmonary;;  ° BRONCHIAL BIOPSY  04/05/2020  ° Procedure: BRONCHIAL BIOPSIES;  Surgeon: Alva, Rakesh V, MD;  Location: [facility];  Service: Cardiopulmonary;;  ° BRONCHIAL BRUSHINGS  01/07/2020  ° Procedure: BRONCHIAL BRUSHINGS;  Surgeon: Byrum, Robert S, MD;  Location: [facility];  Service: Pulmonary;;  ° BRONCHIAL NEEDLE ASPIRATION BIOPSY  01/07/2020  ° Procedure: BRONCHIAL NEEDLE ASPIRATION BIOPSIES;  Surgeon: Byrum, Robert S, MD;  Location: [facility];  Service: Pulmonary;;  ° BRONCHIAL WASHINGS  01/07/2020  ° Procedure: BRONCHIAL WASHINGS;  Surgeon: Byrum, Robert S, MD;  Location: [facility];  Service: Pulmonary;;  ° BRONCHIAL WASHINGS  04/05/2020  ° Procedure: BRONCHIAL WASHINGS;  Surgeon: Alva, Rakesh V, MD;  Location: [facility];  Service: Cardiopulmonary;;  ° COLONOSCOPY  15 years ago  ° HEMOSTASIS CONTROL   01/07/2020  ° Procedure: HEMOSTASIS CONTROL;  Surgeon: Byrum, Robert S, MD;  Location: [facility];  Service: Pulmonary;;  cold saline  ° IR IMAGING GUIDED PORT INSERTION  09/18/2020  ° NASAL TURBINATE REDUCTION  2002  ° Dr.Crossley  ° THORACENTESIS N/A 05/28/2021  ° Procedure: THORACENTESIS;  Surgeon: Hunsucker, Matthew R, MD;  Location: [facility];  Service: Pulmonary;  Laterality: N/A;  ° VASECTOMY    ° VIDEO BRONCHOSCOPY Right 04/05/2020  ° Procedure: VIDEO BRONCHOSCOPY WITH FLUORO;  Surgeon: Alva, Rakesh V, MD;  Location: [facility];  Service: Cardiopulmonary;  Laterality: Right;  ° VIDEO BRONCHOSCOPY WITH ENDOBRONCHIAL NAVIGATION N/A 01/07/2020  ° Procedure: VIDEO BRONCHOSCOPY WITH ENDOBRONCHIAL NAVIGATION;  Surgeon: Byrum, Robert S, MD;  Location: [facility];  Service: Pulmonary;  Laterality: N/A;  ° VIDEO BRONCHOSCOPY WITH ENDOBRONCHIAL ULTRASOUND N/A 01/07/2020  ° Procedure: VIDEO BRONCHOSCOPY   WITH ENDOBRONCHIAL ULTRASOUND;  Surgeon: Byrum, Robert S, MD;  Location: [facility];  Service: Pulmonary;  Laterality: N/A;  ° ° °REVIEW OF SYSTEMS:  Constitutional: positive for fatigue °Eyes: negative °Ears, nose, mouth, throat, and face: negative °Respiratory: positive for dyspnea on exertion and pleurisy/chest pain °Cardiovascular: negative °Gastrointestinal: negative °Genitourinary:negative °Integument/breast: negative °Hematologic/lymphatic: negative °Musculoskeletal:negative °Neurological: negative °Behavioral/Psych: negative °Endocrine: negative °Allergic/Immunologic: negative  ° °PHYSICAL EXAMINATION: General appearance: alert, cooperative, fatigued, and no distress °Head: Normocephalic, without obvious abnormality, atraumatic °Neck: no adenopathy, no JVD, supple, symmetrical, trachea midline, and thyroid not enlarged, symmetric, no tenderness/mass/nodules °Lymph nodes: Cervical, supraclavicular, and axillary nodes normal. °Resp: diminished breath sounds LLL and dullness to percussion LLL °Back: symmetric,  no curvature. ROM normal. No CVA tenderness. °Cardio: regular rate and rhythm, S1, S2 normal, no murmur, click, rub or gallop °GI: soft, non-tender; bowel sounds normal; no masses,  no organomegaly °Extremities: extremities normal, atraumatic, no cyanosis or edema °Neurologic: Alert and oriented X 3, normal strength and tone. Normal symmetric reflexes. Normal coordination and gait ° °ECOG PERFORMANCE STATUS: 1 - Symptomatic but completely ambulatory ° °Blood pressure 110/68, pulse 94, temperature 98 °F (36.7 °C), temperature source Tympanic, resp. rate 19, height 5' 9" (1.753 m), weight 191 lb 6.4 oz (86.8 kg), SpO2 98 %. ° °LABORATORY DATA: °Lab Results  °Component Value Date  ° WBC 7.7 06/03/2021  ° HGB 10.5 (L) 06/03/2021  ° HCT 30.7 (L) 06/03/2021  ° MCV 96.2 06/03/2021  ° PLT 328 06/03/2021  ° ° °  Chemistry   °   °Component Value Date/Time  ° NA 139 06/03/2021 1105  ° K 3.4 (L) 06/03/2021 1105  ° CL 105 06/03/2021 1105  ° CO2 29 06/03/2021 1105  ° BUN 11 06/03/2021 1105  ° CREATININE 0.89 06/03/2021 1105  ° CREATININE 0.67 (L) 06/24/2020 1517  °    °Component Value Date/Time  ° CALCIUM 8.8 (L) 06/03/2021 1105  ° ALKPHOS 94 06/03/2021 1105  ° AST 22 06/03/2021 1105  ° ALT 11 06/03/2021 1105  ° BILITOT 0.5 06/03/2021 1105  °  ° ° ° °RADIOGRAPHIC STUDIES: °CT Chest W Contrast ° °Result Date: 06/22/2021 °CLINICAL DATA:  Restaging non-small cell lung cancer. EXAM: CT CHEST, ABDOMEN, AND PELVIS WITH CONTRAST TECHNIQUE: Multidetector CT imaging of the chest, abdomen and pelvis was performed following the standard protocol during bolus administration of intravenous contrast. RADIATION DOSE REDUCTION: This exam was performed according to the departmental dose-optimization program which includes automated exposure control, adjustment of the mA and/or kV according to patient size and/or use of iterative reconstruction technique. CONTRAST:  100mL OMNIPAQUE IOHEXOL 300 MG/ML  SOLN COMPARISON:  CT scan 04/20/2021 FINDINGS:  CT CHEST FINDINGS Cardiovascular: The heart is normal in size. No pericardial effusion. The aorta is normal in caliber. No dissection. Stable scattered atherosclerotic calcifications. Mediastinum/Nodes: No mediastinal or hilar mass or lymphadenopathy the. Small scattered lymph nodes are stable. The Port-A-Cath is stable. The esophagus is grossly normal. Lungs/Pleura: Stable extensive volume loss in right hemithorax with dense apical scarring changes and bronchiectasis likely related to radiation. No obvious findings to suggest recurrent tumor. Enlarging left pleural effusion with overlying atelectasis. Stable small right pleural effusion. Stable underlying emphysematous changes pulmonary scarring. Stable upper lobe scarring changes on the left side with vague nodularity. Stable 6 mm left lower lobe pulmonary nodule on image 99/6 No new or progressive findings. Musculoskeletal: No significant bony findings. CT ABDOMEN PELVIS FINDINGS Hepatobiliary: No hepatic lesions or intrahepatic biliary dilatation. Gallbladder is unremarkable. No common   bile duct dilatation. Pancreas: No mass, inflammation or ductal dilatation. Spleen: Normal size.  No focal lesions. Adrenals/Urinary Tract: Adrenal glands are unremarkable stable. Stable ill-defined area of cortical thinning and hypodensity associated with the right kidney. No discrete mass. This is likely an area remote post inflammatory or infectious change. No new or progressive findings. The bladder is unremarkable. Stomach/Bowel: The stomach, duodenum, small bowel and colon are unremarkable. Vascular/Lymphatic: Stable vascular calcifications. No mesenteric or retroperitoneal adenopathy. Reproductive: The prostate gland and seminal vesicles are unremarkable. Other: No pelvic mass or adenopathy. No free pelvic fluid collections. No inguinal mass or adenopathy. No abdominal wall hernia or subcutaneous lesions. Musculoskeletal: No acute bony findings. Stable T12 compression  fracture. Stable mild sclerotic changes associated with the S1 vertebral body and also the left pedicle T9. Stable scattered sclerotic pelvic lesions consistent with treated metastasis. IMPRESSION: 1. Stable extensive volume loss in the right hemithorax with dense apical scarring changes and bronchiectasis likely related to radiation. No obvious findings to suggest recurrent tumor. 2. Enlarging left pleural effusion with overlying atelectasis. 3. Stable small right pleural effusion. 4. Stable 6 mm left lower lobe pulmonary nodule. 5. Stable emphysematous changes and pulmonary scarring. 6. No findings for abdominal/pelvic metastatic disease. 7. Stable scattered sclerotic/treated bone metastasis. Emphysema (ICD10-J43.9). Electronically Signed   By: P.  Gallerani M.D.   On: 06/22/2021 15:53  ° °CT Abdomen Pelvis W Contrast ° °Result Date: 06/22/2021 °CLINICAL DATA:  Restaging non-small cell lung cancer. EXAM: CT CHEST, ABDOMEN, AND PELVIS WITH CONTRAST TECHNIQUE: Multidetector CT imaging of the chest, abdomen and pelvis was performed following the standard protocol during bolus administration of intravenous contrast. RADIATION DOSE REDUCTION: This exam was performed according to the departmental dose-optimization program which includes automated exposure control, adjustment of the mA and/or kV according to patient size and/or use of iterative reconstruction technique. CONTRAST:  100mL OMNIPAQUE IOHEXOL 300 MG/ML  SOLN COMPARISON:  CT scan 04/20/2021 FINDINGS: CT CHEST FINDINGS Cardiovascular: The heart is normal in size. No pericardial effusion. The aorta is normal in caliber. No dissection. Stable scattered atherosclerotic calcifications. Mediastinum/Nodes: No mediastinal or hilar mass or lymphadenopathy the. Small scattered lymph nodes are stable. The Port-A-Cath is stable. The esophagus is grossly normal. Lungs/Pleura: Stable extensive volume loss in right hemithorax with dense apical scarring changes and  bronchiectasis likely related to radiation. No obvious findings to suggest recurrent tumor. Enlarging left pleural effusion with overlying atelectasis. Stable small right pleural effusion. Stable underlying emphysematous changes pulmonary scarring. Stable upper lobe scarring changes on the left side with vague nodularity. Stable 6 mm left lower lobe pulmonary nodule on image 99/6 No new or progressive findings. Musculoskeletal: No significant bony findings. CT ABDOMEN PELVIS FINDINGS Hepatobiliary: No hepatic lesions or intrahepatic biliary dilatation. Gallbladder is unremarkable. No common bile duct dilatation. Pancreas: No mass, inflammation or ductal dilatation. Spleen: Normal size.  No focal lesions. Adrenals/Urinary Tract: Adrenal glands are unremarkable stable. Stable ill-defined area of cortical thinning and hypodensity associated with the right kidney. No discrete mass. This is likely an area remote post inflammatory or infectious change. No new or progressive findings. The bladder is unremarkable. Stomach/Bowel: The stomach, duodenum, small bowel and colon are unremarkable. Vascular/Lymphatic: Stable vascular calcifications. No mesenteric or retroperitoneal adenopathy. Reproductive: The prostate gland and seminal vesicles are unremarkable. Other: No pelvic mass or adenopathy. No free pelvic fluid collections. No inguinal mass or adenopathy. No abdominal wall hernia or subcutaneous lesions. Musculoskeletal: No acute bony findings. Stable T12 compression fracture. Stable mild sclerotic changes associated   with the S1 vertebral body and also the left pedicle T9. Stable scattered sclerotic pelvic lesions consistent with treated metastasis. IMPRESSION: 1. Stable extensive volume loss in the right hemithorax with dense apical scarring changes and bronchiectasis likely related to radiation. No obvious findings to suggest recurrent tumor. 2. Enlarging left pleural effusion with overlying atelectasis. 3. Stable small  right pleural effusion. 4. Stable 6 mm left lower lobe pulmonary nodule. 5. Stable emphysematous changes and pulmonary scarring. 6. No findings for abdominal/pelvic metastatic disease. 7. Stable scattered sclerotic/treated bone metastasis. Emphysema (ICD10-J43.9). Electronically Signed   By: P.  Gallerani M.D.   On: 06/22/2021 15:53   ° ° °ASSESSMENT AND PLAN: This is a very pleasant 60 years old white male with metastatic non-small cell lung cancer that was initially diagnosed as stage IIb (T3, N0, M0) non-small cell lung cancer, adenocarcinoma presented with perihilar right upper lobe lung mass with suspicious groundglass opacity in the right middle lobe.  He had evidence for disease metastasis in March 2022 to the right kidney that was biopsy-proven in April 2022. °The patient is not a good surgical candidate for resection according to Dr. Hendrickson. °The patient underwent a course of concurrent chemoradiation with weekly carboplatin for AUC of 2 and paclitaxel 45 mg/M2 status post 5 cycles.  He has been tolerating this treatment well except for fatigue, mild odynophagia as well as recurrent pneumonia and septicemia.  The patient also has radiation-induced pneumonitis. °He was admitted to the hospital several times with recurrent pneumonia.   °The patient had evidence for metastatic disease in April 2022 and he is currently undergoing systemic chemotherapy with carboplatin for AUC of 5, Alimta 500 Mg/M2 and Keytruda 200 Mg IV every 3 weeks status post 13 cycles.  Starting from cycle #5 the patient is on maintenance treatment with Alimta and Keytruda every 3 weeks. °The patient has been tolerating this treatment well with no concerning adverse effect except for mild fatigue and nausea. °He had repeat CT scan of the chest, abdomen pelvis performed recently.  I personally and independently reviewed the scan images and discussed the results with the patient today. °His scan showed no concerning findings for disease  progression except for enlarging left pleural effusion. °I recommended for him to continue his current treatment with maintenance Alimta and Keytruda with cycle #14 today. °For the recurrent left pleural effusion, I will refer the patient to IR for consideration of ultrasound-guided therapeutic left thoracentesis. °I will see him back for follow-up visit in 3 weeks for evaluation before the next cycle of his treatment. °For the pain management I gave him refill of Percocet. °For the constipation the patient will continue on lactulose on as-needed basis. °The patient was advised to call immediately if he has any other concerning symptoms in the interval. ° °The patient voices understanding of current disease status and treatment options and is in agreement with the current care plan. ° °All questions were answered. The patient knows to call the clinic with any problems, questions or concerns. We can certainly see the patient much sooner if necessary. ° °Disclaimer: This note was dictated with voice recognition software. Similar sounding words can inadvertently be transcribed and may not be corrected upon review. ° ° °  °  °

## 2021-06-24 NOTE — Patient Instructions (Signed)
[facility]  Discharge Instructions: ?Thank you for choosing [facility] to provide your oncology and hematology care.  ? ?If you have a lab appointment with the [facility], please go directly to the [facility] and check in at the registration area. ?  ?Wear comfortable clothing and clothing appropriate for easy access to any Portacath or PICC line.  ? ?We strive to give you quality time with your provider. You may need to reschedule your appointment if you arrive late (15 or more minutes).  Arriving late affects you and other patients whose appointments are after yours.  Also, if you miss three or more appointments without notifying the office, you may be dismissed from the clinic at the provider?s discretion.    ?  ?For prescription refill requests, have your pharmacy contact our office and allow 72 hours for refills to be completed.   ? ?Today you received the following chemotherapy and/or immunotherapy agents: Keytruda and Alimta    ?  ?To help prevent nausea and vomiting after your treatment, we encourage you to take your nausea medication as directed. ? ?BELOW ARE SYMPTOMS THAT SHOULD BE REPORTED IMMEDIATELY: ?*FEVER GREATER THAN 100.4 F (38 ?C) OR HIGHER ?*CHILLS OR SWEATING ?*NAUSEA AND VOMITING THAT IS NOT CONTROLLED WITH YOUR NAUSEA MEDICATION ?*UNUSUAL SHORTNESS OF BREATH ?*UNUSUAL BRUISING OR BLEEDING ?*URINARY PROBLEMS (pain or burning when urinating, or frequent urination) ?*BOWEL PROBLEMS (unusual diarrhea, constipation, pain near the anus) ?TENDERNESS IN MOUTH AND THROAT WITH OR WITHOUT PRESENCE OF ULCERS (sore throat, sores in mouth, or a toothache) ?UNUSUAL RASH, SWELLING OR PAIN  ?UNUSUAL VAGINAL DISCHARGE OR ITCHING  ? ?Items with * indicate a potential emergency and should be followed up as soon as possible or go to the Emergency Department if any problems should occur. ? ?Please show the CHEMOTHERAPY ALERT CARD or IMMUNOTHERAPY ALERT CARD at  check-in to the Emergency Department and triage nurse. ? ?Should you have questions after your visit or need to cancel or reschedule your appointment, please contact [facility]  Dept: [phone]  and follow the prompts.  Office hours are 8:00 a.m. to 4:30 p.m. Monday - Friday. Please note that voicemails left after 4:00 p.m. may not be returned until the following business day.  We are closed weekends and major holidays. You have access to a nurse at all times for urgent questions. Please call the main number to the clinic Dept: [phone] and follow the prompts. ? ? ?For any non-urgent questions, you may also contact your provider using MyChart. We now offer e-Visits for anyone 7 and older to request care online for non-urgent symptoms. For details visit [email or web address]. ?  ?Also download the MyChart app! Go to the app store, search "MyChart", open the app, select [facility], and log in with your MyChart username and password. ? ?Due to Covid, a mask is required upon entering the hospital/clinic. If you do not have a mask, one will be given to you upon arrival. For doctor visits, patients may have 1 support person aged 28 or older with them. For treatment visits, patients cannot have anyone with them due to current Covid guidelines and our immunocompromised population.  ? ?

## 2021-06-25 ENCOUNTER — Encounter: Payer: Self-pay | Admitting: Internal Medicine

## 2021-06-28 ENCOUNTER — Telehealth: Payer: Self-pay | Admitting: Internal Medicine

## 2021-06-28 NOTE — Telephone Encounter (Signed)
Scheduled per 03/02 los, patient has been called and notified. ?

## 2021-06-29 ENCOUNTER — Other Ambulatory Visit: Payer: Self-pay | Admitting: Physician Assistant

## 2021-06-29 ENCOUNTER — Telehealth: Payer: Self-pay | Admitting: Medical Oncology

## 2021-06-29 DIAGNOSIS — C3491 Malignant neoplasm of unspecified part of right bronchus or lung: Secondary | ICD-10-CM

## 2021-06-29 MED ORDER — OXYCODONE-ACETAMINOPHEN 5-325 MG PO TABS: 1.0000 | ORAL_TABLET | Freq: Four times a day (QID) | ORAL | 0 refills | Status: DC | PRN

## 2021-06-29 NOTE — Telephone Encounter (Signed)
02/28-Requests refill oxycodone . ?Last refill 02/09 ?

## 2021-06-30 ENCOUNTER — Ambulatory Visit (HOSPITAL_COMMUNITY)
Admission: RE | Admit: 2021-06-30 | Discharge: 2021-06-30 | Disposition: A | Discharge status: Home / Self Care | Payer: BC Managed Care – PPO | Source: Ambulatory Visit | Attending: Physician Assistant | Admitting: Physician Assistant

## 2021-06-30 ENCOUNTER — Ambulatory Visit (HOSPITAL_COMMUNITY)
Admission: RE | Admit: 2021-06-30 | Discharge: 2021-06-30 | Disposition: A | Discharge status: Home / Self Care | Payer: BC Managed Care – PPO | Source: Ambulatory Visit | Attending: Internal Medicine | Admitting: Internal Medicine

## 2021-06-30 ENCOUNTER — Other Ambulatory Visit: Payer: Self-pay

## 2021-06-30 DIAGNOSIS — C3491 Malignant neoplasm of unspecified part of right bronchus or lung: Secondary | ICD-10-CM | POA: Insufficient documentation

## 2021-06-30 DIAGNOSIS — J90 Pleural effusion, not elsewhere classified: Secondary | ICD-10-CM | POA: Diagnosis present

## 2021-06-30 IMAGING — US US THORACENTESIS ASP PLEURAL SPACE W/IMG GUIDE
1 series · 6 of 6 positions shown · non-contrast
Comparison: none

INDICATION: History of non-small cell lung cancer with left pleural effusion.
Request for therapeutic thoracentesis.

[Series 1: us thoracentesis asp pleural s mc & wl · 6 of 6 slices shown]
[im 1/6]
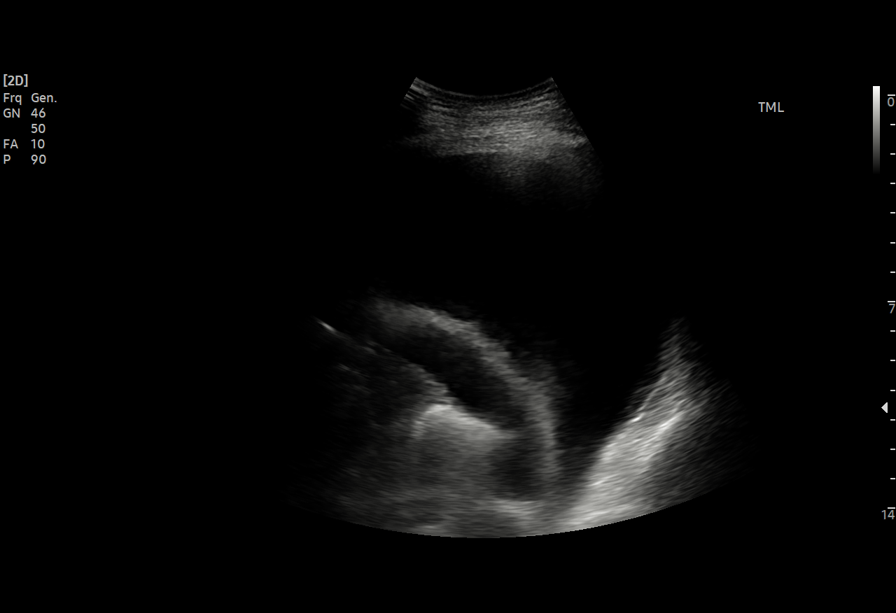
[im 2/6]
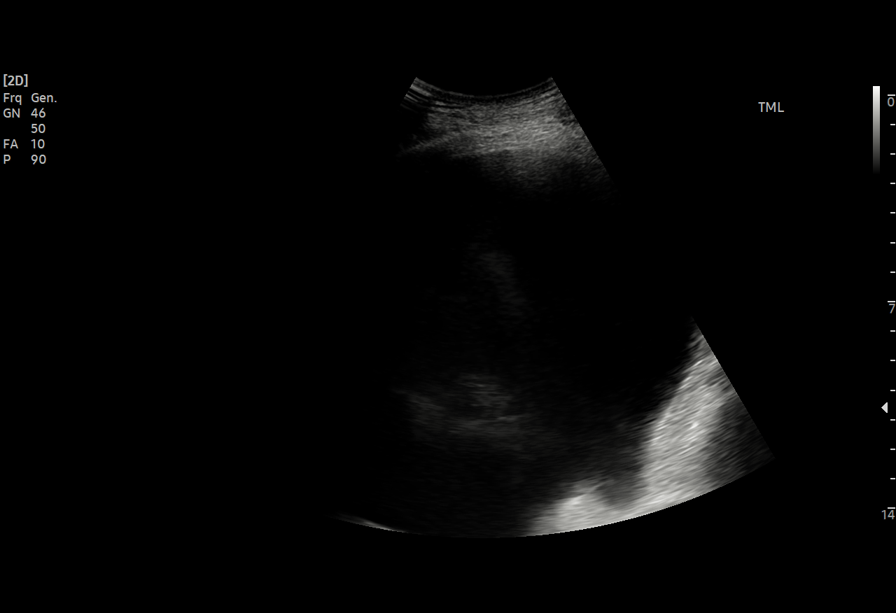
[im 3/6]
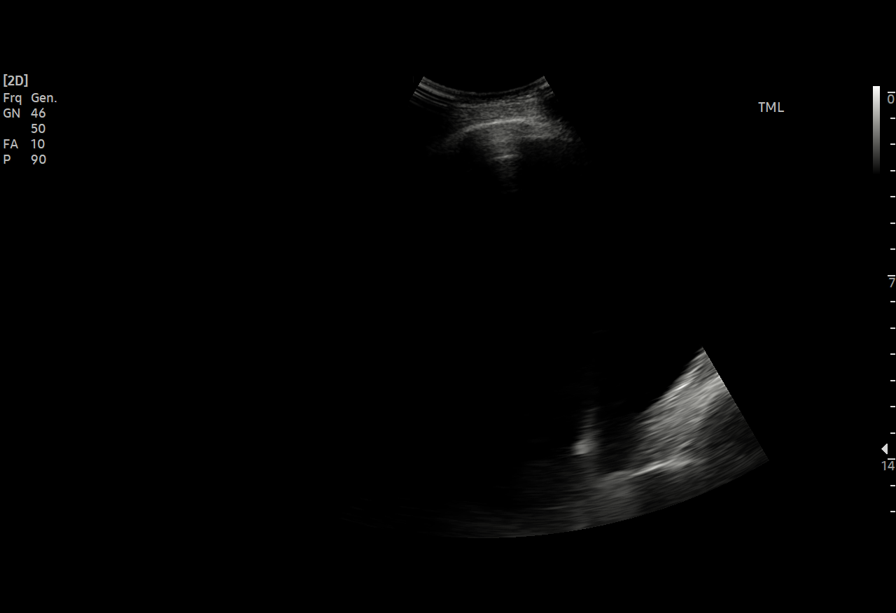
[im 4/6]
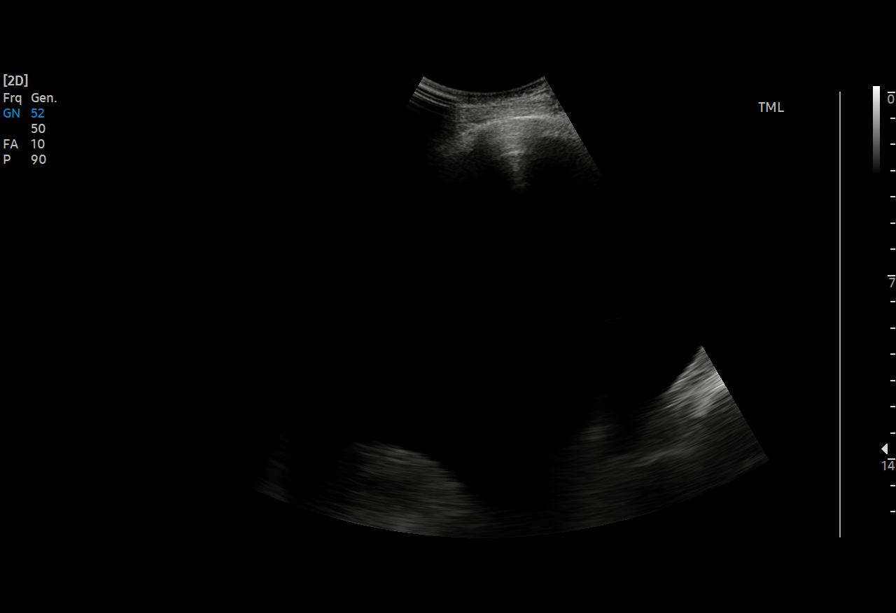
[im 5/6]
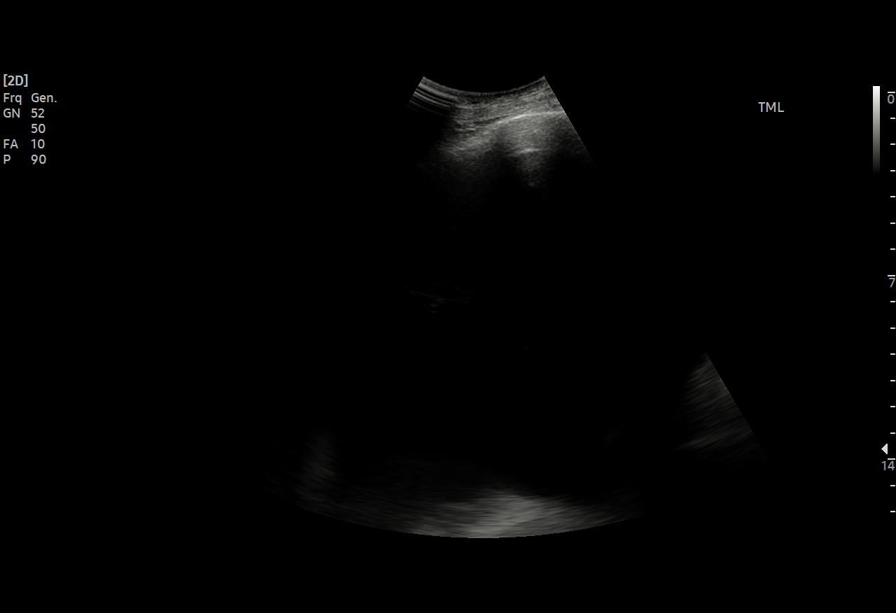
[im 6/6]
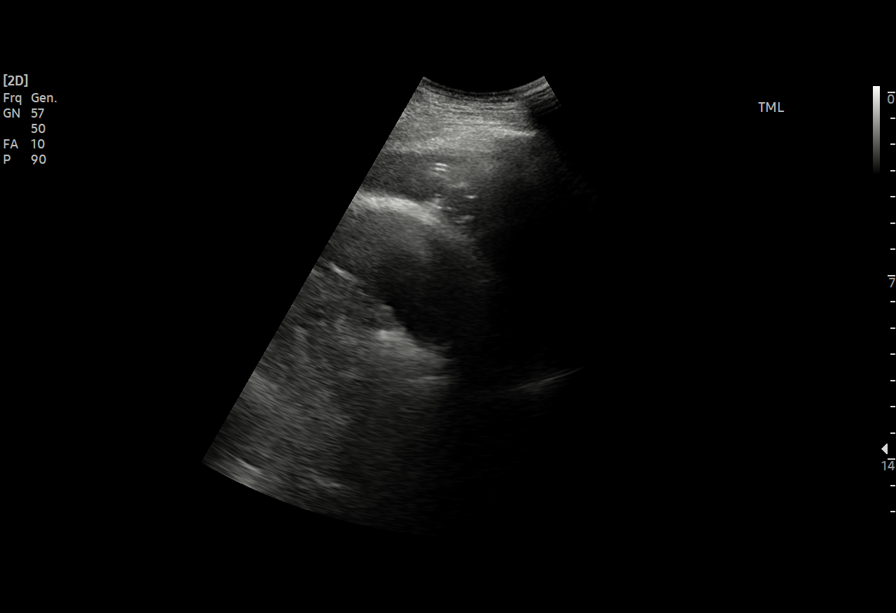

[6 of 6 positions shown; findings below may reference images not displayed]

EXAM:
ULTRASOUND GUIDED LEFT THORACENTESIS

MEDICATIONS:
1% lidocaine 10 mL

COMPLICATIONS:
None immediate.

PROCEDURE:
An ultrasound guided thoracentesis was thoroughly discussed with the
patient and questions answered. The benefits, risks, alternatives
and complications were also discussed. The patient understands and
wishes to proceed with the procedure. Written consent was obtained.

Ultrasound was performed to localize and mark an adequate pocket of
fluid in the left chest. The area was then prepped and draped in the
normal sterile fashion. 1% Lidocaine was used for local anesthesia.
Under ultrasound guidance a 6 Fr Safe-T-Centesis catheter was
introduced. Thoracentesis was performed. The catheter was removed
and a dressing applied.
FINDINGS: A total of approximately 1.6 L of clear yellow fluid was removed.
IMPRESSION: Successful ultrasound guided left thoracentesis yielding 1.6 L of
pleural fluid.

No pneumothorax seen on post-procedure chest x-ray.

## 2021-06-30 IMAGING — CR DG CHEST 1V
1 series · 1 of 1 positions shown · non-contrast
Comparison: [DATE]

CLINICAL DATA: Follow-up thoracentesis

EXAM:
CHEST  1 VIEW

[w chest pa]
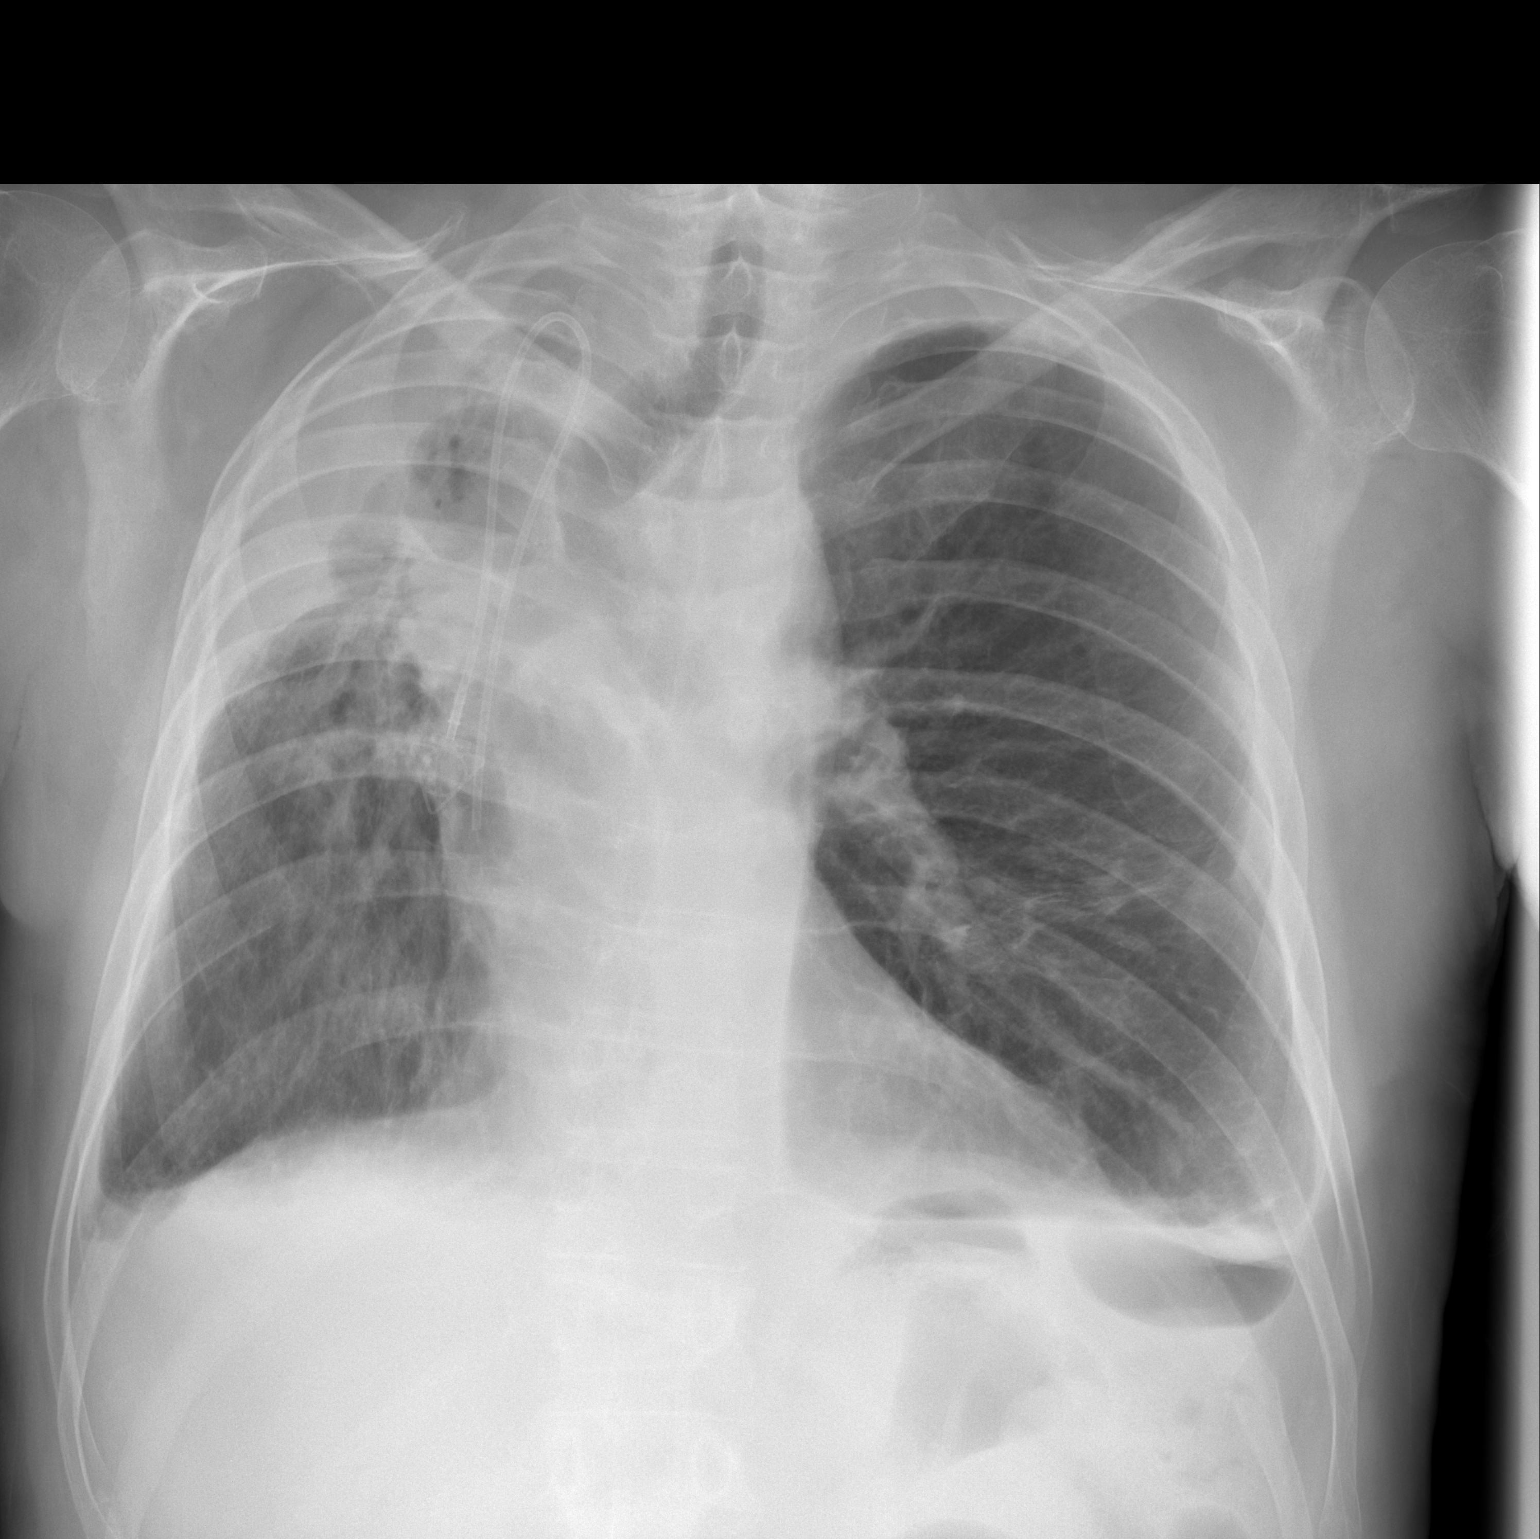

[1 of 1 positions shown; findings below may reference images not displayed]

FINDINGS: Heart size is normal. There has been left thoracentesis by history.
No evidence of pneumothorax. Mild blunting of the left costophrenic
angle indicates a small amount of residual fluid on the left.
Chronic pleural and parenchymal density in the right upper chest is
unchanged. Power port remains in place.
IMPRESSION: No evidence of left pneumothorax. There is only a small amount of
residual pleural density on the left.

## 2021-06-30 MED ORDER — LIDOCAINE HCL 1 % IJ SOLN
INTRAMUSCULAR | Status: AC
  Administered 2021-06-30: 15 mL
  Filled 2021-06-30: qty 20

## 2021-07-01 ENCOUNTER — Ambulatory Visit: Payer: BC Managed Care – PPO | Admitting: Internal Medicine

## 2021-07-01 ENCOUNTER — Other Ambulatory Visit: Payer: BC Managed Care – PPO

## 2021-07-01 ENCOUNTER — Ambulatory Visit (INDEPENDENT_AMBULATORY_CARE_PROVIDER_SITE_OTHER): Payer: BC Managed Care – PPO | Admitting: Pulmonary Disease

## 2021-07-01 ENCOUNTER — Ambulatory Visit: Payer: BC Managed Care – PPO

## 2021-07-01 ENCOUNTER — Encounter: Payer: Self-pay | Admitting: Pulmonary Disease

## 2021-07-01 VITALS — BP 124/68 | HR 106 | Temp 98.5°F | Ht 69.0 in | Wt 181.9 lb

## 2021-07-01 DIAGNOSIS — R0602 Shortness of breath: Secondary | ICD-10-CM

## 2021-07-01 DIAGNOSIS — C3491 Malignant neoplasm of unspecified part of right bronchus or lung: Secondary | ICD-10-CM

## 2021-07-01 MED ORDER — COMBIVENT RESPIMAT 20-100 MCG/ACT IN AERS: INHALATION_SPRAY | RESPIRATORY_TRACT | 5 refills | Status: AC

## 2021-07-01 NOTE — Patient Instructions (Signed)
Nice to see you again ? ?No changes in medication today ? ?If you feel short of breath, feel free to contact myself or Dr. Julien Nordmann to help with evaluating getting rid of the fluid with a thoracentesis again. ? ?Return to clinic in 3 months or sooner as needed with Dr. Silas Flood ?

## 2021-07-01 NOTE — Progress Notes (Signed)
@Patient  ID: Richard Li, male    DOB: 05/08/1960, 61 y.o.   MRN: [id number]  Chief Complaint  Patient presents with   Follow-up    1.6L of fluid drawn of lungs. Sore from procedure and not breathing good. Pt states he is still using his inhalers.     Referring provider: [name], DO  HPI:   61 y.o. man with diagnosed right-sided lung cancer 12/2019 via bronchoscopy/EBUS who spent essentially all of November 2021 in the hospital for recurrent right-sided pneumonia whom are seeing in follow-up of the same.  Continues on doxycycline twice daily and daily prednisone 10 mg.  Since last visit, did do a thoracentesis.  This improved his dyspnea.  Is about a month ago.  Unfortunate developed worsening shortness of breath.  CT scan for staging/surveillance of cancer demonstrated recurrence of left-sided pleural effusion.  Had this drained again yesterday, 1.6 L removed.  Some soreness in that area but otherwise breathing does feel bit better.  Overall, cough congestion has improved with continuing doxycycline and prednisone.[name]   HPI at initial visit: Started with productive cough it seems early November.  This is prompted 4 separate hospitalizations and multiple antibiotic courses.  He had recently completed first cycle of carboplatin/paclitaxel.  Was in the midst of radiation therapy to the right upper lobe mass which he completed around Thanksgiving 2021.  He has had cough productive of green sputum.  It is foul tasting.  Smells bad.  Despite multiple rounds of antibiotic is not improved.  His shortness of breath is worsening.  The most recent hospitalization 03/2020 a bronchoscopy was pursued. Mucopurulent secretions noted emanating from RUL. Secretions likely aspirated seen in BI, RLL. Culture with OP flora. No cell counts sent.  Continually coughing, producing sputum as above. Worried because not getting chemotherapy for cancer. Recently completed coures 14 day prednisone for possible  radiation pneumonitis. This did not help symptoms. Finishing course of augmentin. Not seen improvement.  Serial CXRs reviewed which show persistent and worsening R mid and upper lung field opacities. CT 02/2020 reviewed with necrotic appearing mass near RUL takeoff centrally, dense consolidations posterior RUL and throughout R with scattered GGOs in left lung.   Questionaires / Pulmonary Flowsheets:   ACT:  No flowsheet data found.  MMRC: mMRC Dyspnea Scale mMRC Score  06/05/2020 2    Epworth:  No flowsheet data found.  Tests:   FENO:  No results found for: NITRICOXIDE  PFT: No flowsheet data found.  WALK:  No flowsheet data found.  Imaging: Personally reviewed and as per EMR and discussion this note  DG Chest 1 View  Result Date: 06/30/2021 CLINICAL DATA:  Follow-up thoracentesis EXAM: CHEST  1 VIEW COMPARISON:  05/24/2021 FINDINGS: Heart size is normal. There has been left thoracentesis by history. No evidence of pneumothorax. Mild blunting of the left costophrenic angle indicates a small amount of residual fluid on the left. Chronic pleural and parenchymal density in the right upper chest is unchanged. Power port remains in place. IMPRESSION: No evidence of left pneumothorax. There is only a small amount of residual pleural density on the left. Electronically Signed   By: Nelson Chimes M.D.   On: 06/30/2021 11:31   CT Chest W Contrast  Result Date: 06/22/2021 CLINICAL DATA:  Restaging non-small cell lung cancer. EXAM: CT CHEST, ABDOMEN, AND PELVIS WITH CONTRAST TECHNIQUE: Multidetector CT imaging of the chest, abdomen and pelvis was performed following the standard protocol during bolus administration of intravenous contrast. RADIATION DOSE  REDUCTION: This exam was performed according to the departmental dose-optimization program which includes automated exposure control, adjustment of the mA and/or kV according to patient size and/or use of iterative reconstruction technique.  CONTRAST:  152mL OMNIPAQUE IOHEXOL 300 MG/ML  SOLN COMPARISON:  CT scan 04/20/2021 FINDINGS: CT CHEST FINDINGS Cardiovascular: The heart is normal in size. No pericardial effusion. The aorta is normal in caliber. No dissection. Stable scattered atherosclerotic calcifications. Mediastinum/Nodes: No mediastinal or hilar mass or lymphadenopathy the. Small scattered lymph nodes are stable. The Port-A-Cath is stable. The esophagus is grossly normal. Lungs/Pleura: Stable extensive volume loss in right hemithorax with dense apical scarring changes and bronchiectasis likely related to radiation. No obvious findings to suggest recurrent tumor. Enlarging left pleural effusion with overlying atelectasis. Stable small right pleural effusion. Stable underlying emphysematous changes pulmonary scarring. Stable upper lobe scarring changes on the left side with vague nodularity. Stable 6 mm left lower lobe pulmonary nodule on image 99/6 No new or progressive findings. Musculoskeletal: No significant bony findings. CT ABDOMEN PELVIS FINDINGS Hepatobiliary: No hepatic lesions or intrahepatic biliary dilatation. Gallbladder is unremarkable. No common bile duct dilatation. Pancreas: No mass, inflammation or ductal dilatation. Spleen: Normal size.  No focal lesions. Adrenals/Urinary Tract: Adrenal glands are unremarkable stable. Stable ill-defined area of cortical thinning and hypodensity associated with the right kidney. No discrete mass. This is likely an area remote post inflammatory or infectious change. No new or progressive findings. The bladder is unremarkable. Stomach/Bowel: The stomach, duodenum, small bowel and colon are unremarkable. Vascular/Lymphatic: Stable vascular calcifications. No mesenteric or retroperitoneal adenopathy. Reproductive: The prostate gland and seminal vesicles are unremarkable. Other: No pelvic mass or adenopathy. No free pelvic fluid collections. No inguinal mass or adenopathy. No abdominal wall hernia  or subcutaneous lesions. Musculoskeletal: No acute bony findings. Stable T12 compression fracture. Stable mild sclerotic changes associated with the S1 vertebral body and also the left pedicle T9. Stable scattered sclerotic pelvic lesions consistent with treated metastasis. IMPRESSION: 1. Stable extensive volume loss in the right hemithorax with dense apical scarring changes and bronchiectasis likely related to radiation. No obvious findings to suggest recurrent tumor. 2. Enlarging left pleural effusion with overlying atelectasis. 3. Stable small right pleural effusion. 4. Stable 6 mm left lower lobe pulmonary nodule. 5. Stable emphysematous changes and pulmonary scarring. 6. No findings for abdominal/pelvic metastatic disease. 7. Stable scattered sclerotic/treated bone metastasis. Emphysema (ICD10-J43.9). Electronically Signed   By: Marijo Sanes M.D.   On: 06/22/2021 15:53   CT Abdomen Pelvis W Contrast  Result Date: 06/22/2021 CLINICAL DATA:  Restaging non-small cell lung cancer. EXAM: CT CHEST, ABDOMEN, AND PELVIS WITH CONTRAST TECHNIQUE: Multidetector CT imaging of the chest, abdomen and pelvis was performed following the standard protocol during bolus administration of intravenous contrast. RADIATION DOSE REDUCTION: This exam was performed according to the departmental dose-optimization program which includes automated exposure control, adjustment of the mA and/or kV according to patient size and/or use of iterative reconstruction technique. CONTRAST:  170mL OMNIPAQUE IOHEXOL 300 MG/ML  SOLN COMPARISON:  CT scan 04/20/2021 FINDINGS: CT CHEST FINDINGS Cardiovascular: The heart is normal in size. No pericardial effusion. The aorta is normal in caliber. No dissection. Stable scattered atherosclerotic calcifications. Mediastinum/Nodes: No mediastinal or hilar mass or lymphadenopathy the. Small scattered lymph nodes are stable. The Port-A-Cath is stable. The esophagus is grossly normal. Lungs/Pleura: Stable  extensive volume loss in right hemithorax with dense apical scarring changes and bronchiectasis likely related to radiation. No obvious findings to suggest recurrent tumor. Enlarging left  pleural effusion with overlying atelectasis. Stable small right pleural effusion. Stable underlying emphysematous changes pulmonary scarring. Stable upper lobe scarring changes on the left side with vague nodularity. Stable 6 mm left lower lobe pulmonary nodule on image 99/6 No new or progressive findings. Musculoskeletal: No significant bony findings. CT ABDOMEN PELVIS FINDINGS Hepatobiliary: No hepatic lesions or intrahepatic biliary dilatation. Gallbladder is unremarkable. No common bile duct dilatation. Pancreas: No mass, inflammation or ductal dilatation. Spleen: Normal size.  No focal lesions. Adrenals/Urinary Tract: Adrenal glands are unremarkable stable. Stable ill-defined area of cortical thinning and hypodensity associated with the right kidney. No discrete mass. This is likely an area remote post inflammatory or infectious change. No new or progressive findings. The bladder is unremarkable. Stomach/Bowel: The stomach, duodenum, small bowel and colon are unremarkable. Vascular/Lymphatic: Stable vascular calcifications. No mesenteric or retroperitoneal adenopathy. Reproductive: The prostate gland and seminal vesicles are unremarkable. Other: No pelvic mass or adenopathy. No free pelvic fluid collections. No inguinal mass or adenopathy. No abdominal wall hernia or subcutaneous lesions. Musculoskeletal: No acute bony findings. Stable T12 compression fracture. Stable mild sclerotic changes associated with the S1 vertebral body and also the left pedicle T9. Stable scattered sclerotic pelvic lesions consistent with treated metastasis. IMPRESSION: 1. Stable extensive volume loss in the right hemithorax with dense apical scarring changes and bronchiectasis likely related to radiation. No obvious findings to suggest recurrent  tumor. 2. Enlarging left pleural effusion with overlying atelectasis. 3. Stable small right pleural effusion. 4. Stable 6 mm left lower lobe pulmonary nodule. 5. Stable emphysematous changes and pulmonary scarring. 6. No findings for abdominal/pelvic metastatic disease. 7. Stable scattered sclerotic/treated bone metastasis. Emphysema (ICD10-J43.9). Electronically Signed   By: Marijo Sanes M.D.   On: 06/22/2021 15:53   US Thoracentesis Asp Pleural space w/IMG guide  Result Date: 06/30/2021 INDICATION: History of non-small cell lung cancer with left pleural effusion. Request for therapeutic thoracentesis. EXAM: ULTRASOUND GUIDED LEFT THORACENTESIS MEDICATIONS: 1% lidocaine 10 mL COMPLICATIONS: None immediate. PROCEDURE: An ultrasound guided thoracentesis was thoroughly discussed with the patient and questions answered. The benefits, risks, alternatives and complications were also discussed. The patient understands and wishes to proceed with the procedure. Written consent was obtained. Ultrasound was performed to localize and mark an adequate pocket of fluid in the left chest. The area was then prepped and draped in the normal sterile fashion. 1% Lidocaine was used for local anesthesia. Under ultrasound guidance a 6 Fr Safe-T-Centesis catheter was introduced. Thoracentesis was performed. The catheter was removed and a dressing applied. FINDINGS: A total of approximately 1.6 L of clear yellow fluid was removed. IMPRESSION: Successful ultrasound guided left thoracentesis yielding 1.6 L of pleural fluid. No pneumothorax seen on post-procedure chest x-ray. Procedure performed by: Gareth Eagle, PA-C Electronically Signed   By: Markus Daft M.D.   On: 06/30/2021 17:01     Lab Results: Personally reviewed CBC    Component Value Date/Time   WBC 6.5 06/24/2021 0911   WBC 7.9 08/20/2020 0941   RBC 2.95 (L) 06/24/2021 0911   HGB 9.7 (L) 06/24/2021 0911   HCT 29.0 (L) 06/24/2021 0911   PLT 327 06/24/2021 0911   MCV  98.3 06/24/2021 0911   MCH 32.9 06/24/2021 0911   MCHC 33.4 06/24/2021 0911   RDW 16.1 (H) 06/24/2021 0911   LYMPHSABS 0.4 (L) 06/24/2021 0911   MONOABS 0.9 06/24/2021 0911   EOSABS 0.0 06/24/2021 0911   BASOSABS 0.0 06/24/2021 0911    BMET    Component Value Date/Time  NA 138 06/24/2021 0911   K 3.1 (L) 06/24/2021 0911   CL 105 06/24/2021 0911   CO2 27 06/24/2021 0911   GLUCOSE 104 (H) 06/24/2021 0911   BUN 9 06/24/2021 0911   CREATININE 0.96 06/24/2021 0911   CREATININE 0.67 (L) 06/24/2020 1517   CALCIUM 9.1 06/24/2021 0911   GFRNONAA >60 06/24/2021 0911   GFRNONAA 105 06/24/2020 1517   GFRAA 122 06/24/2020 1517    BNP No results found for: BNP  ProBNP No results found for: PROBNP  Specialty Problems       Pulmonary Problems   Allergic rhinitis    Qualifier: Diagnosis of  By: Julien Girt CMA, Leigh        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HCC)    Qualifier: Diagnosis of  By: Julien Girt CMA, Leigh        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   Pulmonary nodules/lesions, multiple   Adenocarcinoma of right lung, stage 2 (HCC)   SOB (shortness of breath)   Recurrent pneumonia   Adenocarcinoma of right lung, stage 4 (HCC)    Allergies  Allergen Reactions   Penicillins     Unknown reaction   Amoxicillin Rash    Immunization History  Administered Date(s) Administered   Influenza Split 01/25/2012   Influenza Whole 02/13/2009, 01/25/2010   Influenza,inj,Quad PF,6+ Mos 03/30/2015, 02/16/2016, 02/27/2017, 01/24/2018, 03/01/2019, 02/10/2020, 02/16/2021   Influenza,inj,quad, With Preservative 01/23/2017   Tdap 12/12/2011    Past Medical History:  Diagnosis Date   Allergic rhinitis, cause unspecified    Anxiety state, unspecified    Asthma    as a child   Chronic airway obstruction, not elsewhere classified    Esophageal reflux    History of kidney stones    History of radiation therapy 02/13/2020-03/24/2020    right lung        Dr Gery Pray   Hypertension    Lumbago    lung ca dx'd 12/2019   Other and unspecified hyperlipidemia    Other chest pain     Tobacco History: Social History   Tobacco Use  Smoking Status Former   Packs/day: 2.00   Years: 28.00   Pack years: 56.00   Types: Cigarettes   Quit date: 04/25/2005   Years since quitting: 16.1  Smokeless Tobacco Never   Counseling given: Not Answered   Continue to not smoke  Outpatient Encounter Medications as of 07/01/2021  Medication Sig   acetaminophen (TYLENOL) 500 MG tablet Take 1,000 mg by mouth every 8 (eight) hours as needed for moderate pain or mild pain.   dextromethorphan-guaiFENesin (MUCINEX DM) 30-600 MG 12hr tablet Take 1 tablet by mouth 2 (two) times daily.   doxycycline (VIBRA-TABS) 100 MG tablet Take 1 tablet by mouth twice daily   feeding supplement (ENSURE ENLIVE / ENSURE PLUS) LIQD Take 237 mLs by mouth 3 (three) times daily between meals. (Patient taking differently: Take 237 mLs by mouth daily.)   fenofibrate 160 MG tablet Take 1 tablet by mouth once daily   ferrous sulfate 325 (65 FE) MG tablet Take 1 tablet (325 mg total) by mouth daily with breakfast.   folic acid (FOLVITE) 1 MG tablet Take 1 tablet by mouth once daily   lactulose (CHRONULAC) 10 GM/15ML solution TAKE 15 MLS BY MOUTH 3 TIMES DAILY Strength: 10 GM/15ML   lidocaine-prilocaine (EMLA) cream Apply 1 application topically as needed.   LORazepam (ATIVAN) 1 MG tablet Take 1 tablet (1 mg total) by mouth every  8 (eight) hours as needed for anxiety.   Multiple Vitamin (MULTIVITAMIN WITH MINERALS) TABS tablet Take 1 tablet by mouth daily.   ondansetron (ZOFRAN) 8 MG tablet Take 1 tablet (8 mg total) by mouth every 8 (eight) hours as needed for nausea or vomiting. Starting day 3 after chemotherapy   oxyCODONE-acetaminophen (PERCOCET/ROXICET) 5-325 MG tablet Take 1 tablet by mouth every 6 (six) hours as needed for severe pain.   potassium chloride SA  (KLOR-CON M) 20 MEQ tablet Take 1 tablet (20 mEq total) by mouth daily.   predniSONE (DELTASONE) 10 MG tablet Take 1 tablet (10 mg total) by mouth daily with breakfast.   prochlorperazine (COMPAZINE) 10 MG tablet Take 1 tablet (10 mg total) by mouth every 6 (six) hours as needed.   [DISCONTINUED] COMBIVENT RESPIMAT 20-100 MCG/ACT AERS respimat INHALE 1 TO 2 PUFFS BY MOUTH EVERY 6 HOURS AS NEEDED FOR WHEEZING   Ipratropium-Albuterol (COMBIVENT RESPIMAT) 20-100 MCG/ACT AERS respimat INHALE 1 TO 2 PUFFS BY MOUTH EVERY 6 HOURS AS NEEDED FOR WHEEZING   [DISCONTINUED] sucralfate (CARAFATE) 1 g tablet Take 1 tablet (1 g total) by mouth 4 (four) times daily -  with meals and at bedtime. (Patient not taking: Reported on 03/05/2020)   Facility-Administered Encounter Medications as of 07/01/2021  Medication   0.9 %  sodium chloride infusion     Review of Systems n/a  Physical Exam  BP 124/68 (BP Location: Right Arm, Patient Position: Sitting, Cuff Size: Normal)    Pulse (!) 106    Temp 98.5 F (36.9 C) (Oral)    Ht 5\' 9"  (1.753 m)    Wt 181 lb 14.4 oz (82.5 kg)    SpO2 100%    BMI 26.86 kg/m   Wt Readings from Last 5 Encounters:  07/01/21 181 lb 14.4 oz (82.5 kg)  06/24/21 191 lb 6.4 oz (86.8 kg)  06/18/21 192 lb (87.1 kg)  06/03/21 194 lb 11.2 oz (88.3 kg)  05/24/21 198 lb 12.8 oz (90.2 kg)    BMI Readings from Last 5 Encounters:  07/01/21 26.86 kg/m  06/24/21 28.26 kg/m  06/18/21 28.35 kg/m  06/03/21 28.75 kg/m  05/24/21 29.36 kg/m     Physical Exam General: Sitting up in exam chair, in NAD Eyes: EOMI, no icterus Respiratory: bronchial breath sounds on inspiration throughout the right particularly upper lung fields, left clear, diminished right base Cardiovascular: tachycardic, no murmurs Extremities: No edema, warm   Assessment & Plan:   Productive cough, CT/CXR infiltrate: Continue doxycycline and prednisone.  Have trialed stopping doxycycline twice now without  improvement, interval worsening of symptoms but seems.  Continue prednisone 10 mg daily.  Symptoms improved overall over more recent symptoms.  Dyspnea exertion, weakness due to left-sided pleural effusion: Worsening recently.  Pleural effusion on left 03/2021 CT scan.  And again 06/2021.  Suspect is malignant given lymphocyte predominance although negative cytology.  Improved with thoracentesis 05/2020.  Fluid has recurred in interim status post thoracentesis 06/30/2021.  Encouraged to contact myself or Dr. Earlie Server if he feels symptomatic in the future and we can repeat thoracentesis as needed.  Briefly discussed indwelling catheter as well.  Saba/sama inhaler refilled today.   Return in about 3 months (around 10/01/2021).   Lanier Clam, MD 07/01/2021

## 2021-07-12 NOTE — Progress Notes (Signed)
[facility] ?OFFICE PROGRESS NOTE ? ?[name], DO ?[address] ?[place] [address] ? ?DIAGNOSIS: Metastatic non-small cell lung cancer initially diagnosed as stage IIB (T3, N0, M0) non-small cell lung cancer, adenocarcinoma presented with large central perihilar mass with suspicious groundglass opacity in the right middle lobe and left upper lobe diagnosed in September 2021.  The patient had evidence of metastatic disease to the right kidney, right pleural space, liver, thoracic:/Abdominal lymph nodes, and bones.  There is also a probable left-sided pleural metastasis as well in March 2022/May 2022.  ?  ?Molecular studies by guardant 360: No actionable mutations. ?  ?PDl1: 80% ? ?PRIOR THERAPY: ?1) Concurrent chemoradiation with weekly carboplatin for AUC of 2 and paclitaxel 45 mg/M2.  First dose February 10, 2020.  Status post 5  cycles. ?2) Palliative radiation to the left anterior rib cage area as well as the mid back area under the care of Dr. Sondra Come. Last treatment on 10/05/20. ?3) SRS to the brain (central right cerebellum) lesion completed on 10/13/20.  ? ?CURRENT THERAPY: Palliative systemic chemotherapy with carboplatin AUC of 5, Alimta 500 mg per metered squared, Keytruda 200 mg IV every 3 weeks.  First dose expected on 09/24/2020. Status post 14 cycles.  Starting from cycle #5, the patient has been on maintenance Alimta and Keytruda. ? ?INTERVAL HISTORY: ?Richard Li 61 y.o. male returns to the clinic today for a follow up visit. The patient is feeling fair today except he has felt more short of breath the last 2-3 days. In the interval since last being seen, the patient underwent repeat thoracentesis for recurrent left pleural effusion on 06/30/21 and 1.6 L of fluid was drained. He saw Dr. Silas Flood, his pulmonologist in the interval who mentioned indwelling catheter to the patient. The patient is feeling similar to when he had recurrent effusions in the past.   He is reporting left-sided heaviness, pressure in his back, and shortness of breath.  He also is reporting racing heart with exertion.  Denies any unusual chest discomfort besides his chronic left-sided rib pain for which she is prescribed oxycodone.  He is followed closely by pulmonology for his COPD.  He is currently taking prednisone 10 mg daily.  He completed a course of doxycycline for his cough.  He reports his cough has improved but he still has a cough in the mornings and a mild cough intermittently throughout the day.  He denies any history of arrhythmias. ? ?He denies any fever or chills.  He intermittently gets night sweats. He sometimes has nausea/dry heaves but has not had any nausea or vomiting in the interval from his last appointment.  He has a prescription for Zofran and Compazine.  He has intermittent constipation for which he will take laxatives as well as stool softeners.  He takes 2 stool softeners per day. Denies any headache or visual changes.  He denies any headache or visual changes.  It looks like he is scheduled for repeat brain MRI next month on 07/29/2021. The patient is here today for evaluation and repeat blood work before starting cycle #15 ? ?MEDICAL HISTORY: ?Past Medical History:  ?Diagnosis Date  ? Allergic rhinitis, cause unspecified   ? Anxiety state, unspecified   ? Asthma   ? as a child  ? Chronic airway obstruction, not elsewhere classified   ? Esophageal reflux   ? History of kidney stones   ? History of radiation therapy 02/13/2020-03/24/2020  ? right  lung        Dr Gery Pray  ? Hypertension   ? Lumbago   ? lung ca dx'd 12/2019  ? Other and unspecified hyperlipidemia   ? Other chest pain   ? ? ?ALLERGIES:  is allergic to penicillins and amoxicillin. ? ?MEDICATIONS:  ?Current Outpatient Medications  ?Medication Sig Dispense Refill  ? potassium chloride SA (KLOR-CON M) 20 MEQ tablet Take 1 tablet (20 mEq total) by mouth daily. 5 tablet 0  ? acetaminophen (TYLENOL) 500 MG  tablet Take 1,000 mg by mouth every 8 (eight) hours as needed for moderate pain or mild pain.    ? dextromethorphan-guaiFENesin (MUCINEX DM) 30-600 MG 12hr tablet Take 1 tablet by mouth 2 (two) times daily.    ? doxycycline (VIBRA-TABS) 100 MG tablet Take 1 tablet by mouth twice daily 60 tablet 3  ? feeding supplement (ENSURE ENLIVE / ENSURE PLUS) LIQD Take 237 mLs by mouth 3 (three) times daily between meals. (Patient taking differently: Take 237 mLs by mouth daily.) 237 mL 12  ? fenofibrate 160 MG tablet Take 1 tablet by mouth once daily 90 tablet 0  ? ferrous sulfate 325 (65 FE) MG tablet Take 1 tablet (325 mg total) by mouth daily with breakfast. 30 tablet 0  ? folic acid (FOLVITE) 1 MG tablet Take 1 tablet by mouth once daily 30 tablet 0  ? Ipratropium-Albuterol (COMBIVENT RESPIMAT) 20-100 MCG/ACT AERS respimat INHALE 1 TO 2 PUFFS BY MOUTH EVERY 6 HOURS AS NEEDED FOR WHEEZING 4 g 5  ? lactulose (CHRONULAC) 10 GM/15ML solution TAKE 15 MLS BY MOUTH 3 TIMES DAILY Strength: 10 GM/15ML 236 mL 0  ? lidocaine-prilocaine (EMLA) cream Apply 1 application topically as needed. 30 g 2  ? LORazepam (ATIVAN) 1 MG tablet Take 1 tablet (1 mg total) by mouth every 8 (eight) hours as needed for anxiety. 90 tablet 5  ? Multiple Vitamin (MULTIVITAMIN WITH MINERALS) TABS tablet Take 1 tablet by mouth daily.    ? ondansetron (ZOFRAN) 8 MG tablet Take 1 tablet (8 mg total) by mouth every 8 (eight) hours as needed for nausea or vomiting. Starting day 3 after chemotherapy 30 tablet 2  ? oxyCODONE-acetaminophen (PERCOCET/ROXICET) 5-325 MG tablet Take 1 tablet by mouth every 6 (six) hours as needed for severe pain. 30 tablet 0  ? predniSONE (DELTASONE) 10 MG tablet Take 1 tablet (10 mg total) by mouth daily with breakfast. 90 tablet 3  ? prochlorperazine (COMPAZINE) 10 MG tablet Take 1 tablet (10 mg total) by mouth every 6 (six) hours as needed. 30 tablet 2  ? ?No current facility-administered medications for this visit.   ? ?Facility-Administered Medications Ordered in Other Visits  ?Medication Dose Route Frequency Provider Last Rate Last Admin  ? 0.9 %  sodium chloride infusion   Intravenous Continuous Curt Bears, MD   Stopped at 03/05/20 1410  ? heparin lock flush 100 unit/mL  500 Units Intracatheter Once PRN Curt Bears, MD      ? pembrolizumab [facility]) 200 mg in sodium chloride 0.9 % 50 mL chemo infusion  200 mg Intravenous Once Curt Bears, MD      ? PEMEtrexed (ALIMTA) 1,000 mg in sodium chloride 0.9 % 100 mL chemo infusion  500 mg/m2 (Treatment Plan Recorded) Intravenous Once Curt Bears, MD      ? sodium chloride flush (NS) 0.9 % injection 10 mL  10 mL Intracatheter PRN Curt Bears, MD      ? ? ?SURGICAL HISTORY:  ?Past Surgical History:  ?  Procedure Laterality Date  ? APPENDECTOMY    ? BRONCHIAL BIOPSY  01/07/2020  ? Procedure: BRONCHIAL BIOPSIES;  Surgeon: Collene Gobble, MD;  Location: [facility] ENDOSCOPY;  Service: Pulmonary;;  ? BRONCHIAL BIOPSY  04/05/2020  ? Procedure: BRONCHIAL BIOPSIES;  Surgeon: Rigoberto Noel, MD;  Location: [facility];  Service: Cardiopulmonary;;  ? BRONCHIAL BRUSHINGS  01/07/2020  ? Procedure: BRONCHIAL BRUSHINGS;  Surgeon: Collene Gobble, MD;  Location: [facility] ENDOSCOPY;  Service: Pulmonary;;  ? BRONCHIAL NEEDLE ASPIRATION BIOPSY  01/07/2020  ? Procedure: BRONCHIAL NEEDLE ASPIRATION BIOPSIES;  Surgeon: Collene Gobble, MD;  Location: [facility] ENDOSCOPY;  Service: Pulmonary;;  ? BRONCHIAL WASHINGS  01/07/2020  ? Procedure: BRONCHIAL WASHINGS;  Surgeon: Collene Gobble, MD;  Location[facility] ENDOSCOPY;  Service: Pulmonary;;  ? BRONCHIAL WASHINGS  04/05/2020  ? Procedure: BRONCHIAL WASHINGS;  Surgeon: Rigoberto Noel, MD;  Location: [facility] ENDOSCOPY;  Service: Cardiopulmonary;;  ? COLONOSCOPY  15 years ago  ? HEMOSTASIS CONTROL  01/07/2020  ? Procedure: HEMOSTASIS CONTROL;  Surgeon: Collene Gobble, MD;  Location: [facility] ENDOSCOPY;  Service: Pulmonary;;  cold saline  ? IR IMAGING GUIDED PORT INSERTION   09/18/2020  ? NASAL TURBINATE REDUCTION  2002  ? Dr.Crossley  ? THORACENTESIS N/A 05/28/2021  ? Procedure: THORACENTESIS;  Surgeon: Lanier Clam, MD;  Location: [facility] ENDOSCOPY;  Service: Pulmonary;  Laterality: N/A

## 2021-07-15 ENCOUNTER — Other Ambulatory Visit: Payer: Self-pay

## 2021-07-15 ENCOUNTER — Ambulatory Visit (HOSPITAL_COMMUNITY)
Admission: RE | Admit: 2021-07-15 | Discharge: 2021-07-15 | Disposition: A | Discharge status: Home / Self Care | Payer: BC Managed Care – PPO | Source: Ambulatory Visit | Attending: Physician Assistant | Admitting: Physician Assistant

## 2021-07-15 ENCOUNTER — Inpatient Hospital Stay: Payer: BC Managed Care – PPO

## 2021-07-15 ENCOUNTER — Telehealth: Payer: Self-pay

## 2021-07-15 ENCOUNTER — Other Ambulatory Visit: Payer: BC Managed Care – PPO

## 2021-07-15 ENCOUNTER — Inpatient Hospital Stay (HOSPITAL_BASED_OUTPATIENT_CLINIC_OR_DEPARTMENT_OTHER): Payer: BC Managed Care – PPO | Admitting: Physician Assistant

## 2021-07-15 VITALS — BP 119/73 | HR 101 | Temp 98.6°F | Resp 16

## 2021-07-15 VITALS — BP 142/85 | HR 124 | Temp 98.7°F | Resp 17 | Wt 187.6 lb

## 2021-07-15 DIAGNOSIS — R000 Tachycardia, unspecified: Secondary | ICD-10-CM | POA: Diagnosis not present

## 2021-07-15 DIAGNOSIS — D649 Anemia, unspecified: Secondary | ICD-10-CM

## 2021-07-15 DIAGNOSIS — C3491 Malignant neoplasm of unspecified part of right bronchus or lung: Secondary | ICD-10-CM | POA: Insufficient documentation

## 2021-07-15 DIAGNOSIS — Z5111 Encounter for antineoplastic chemotherapy: Secondary | ICD-10-CM | POA: Diagnosis not present

## 2021-07-15 DIAGNOSIS — J90 Pleural effusion, not elsewhere classified: Secondary | ICD-10-CM

## 2021-07-15 DIAGNOSIS — E860 Dehydration: Secondary | ICD-10-CM

## 2021-07-15 DIAGNOSIS — E876 Hypokalemia: Secondary | ICD-10-CM | POA: Diagnosis not present

## 2021-07-15 DIAGNOSIS — Z5112 Encounter for antineoplastic immunotherapy: Secondary | ICD-10-CM

## 2021-07-15 LAB — CBC WITH DIFFERENTIAL (CANCER CENTER ONLY)
Abs Immature Granulocytes: 0.03 10*3/uL (ref 0.00–0.07)
Basophils Absolute: 0 10*3/uL (ref 0.0–0.1)
Basophils Relative: 0 %
Eosinophils Absolute: 0 10*3/uL (ref 0.0–0.5)
Eosinophils Relative: 1 %
HCT: 28.2 % — ABNORMAL LOW (ref 39.0–52.0)
Hemoglobin: 9.4 g/dL — ABNORMAL LOW (ref 13.0–17.0)
Immature Granulocytes: 0 %
Lymphocytes Relative: 6 %
Lymphs Abs: 0.5 10*3/uL — ABNORMAL LOW (ref 0.7–4.0)
MCH: 32.9 pg (ref 26.0–34.0)
MCHC: 33.3 g/dL (ref 30.0–36.0)
MCV: 98.6 fL (ref 80.0–100.0)
Monocytes Absolute: 1.1 10*3/uL — ABNORMAL HIGH (ref 0.1–1.0)
Monocytes Relative: 14 %
Neutro Abs: 6.1 10*3/uL (ref 1.7–7.7)
Neutrophils Relative %: 79 %
Platelet Count: 348 10*3/uL (ref 150–400)
RBC: 2.86 MIL/uL — ABNORMAL LOW (ref 4.22–5.81)
RDW: 16.7 % — ABNORMAL HIGH (ref 11.5–15.5)
WBC Count: 7.8 10*3/uL (ref 4.0–10.5)
nRBC: 0 % (ref 0.0–0.2)

## 2021-07-15 LAB — CMP (CANCER CENTER ONLY)
ALT: 9 U/L (ref 0–44)
AST: 20 U/L (ref 15–41)
Albumin: 3 g/dL — ABNORMAL LOW (ref 3.5–5.0)
Alkaline Phosphatase: 107 U/L (ref 38–126)
Anion gap: 8 (ref 5–15)
BUN: 8 mg/dL (ref 6–20)
CO2: 26 mmol/L (ref 22–32)
Calcium: 8.9 mg/dL (ref 8.9–10.3)
Chloride: 105 mmol/L (ref 98–111)
Creatinine: 0.9 mg/dL (ref 0.61–1.24)
GFR, Estimated: >60 mL/min (ref >60)
Glucose, Bld: 110 mg/dL — ABNORMAL HIGH (ref 70–99)
Potassium: 3.2 mmol/L — ABNORMAL LOW (ref 3.5–5.1)
Sodium: 139 mmol/L (ref 135–145)
Total Bilirubin: 0.5 mg/dL (ref 0.3–1.2)
Total Protein: 6.1 g/dL — ABNORMAL LOW (ref 6.5–8.1)

## 2021-07-15 LAB — TSH: TSH: 2.005 u[IU]/mL (ref 0.320–4.118)

## 2021-07-15 IMAGING — DX DG CHEST 2V
2 series · 2 of 2 positions shown · non-contrast
Comparison: [DATE]

CLINICAL DATA: Lung cancer short of breath

EXAM:
CHEST - 2 VIEW

[chest pa]
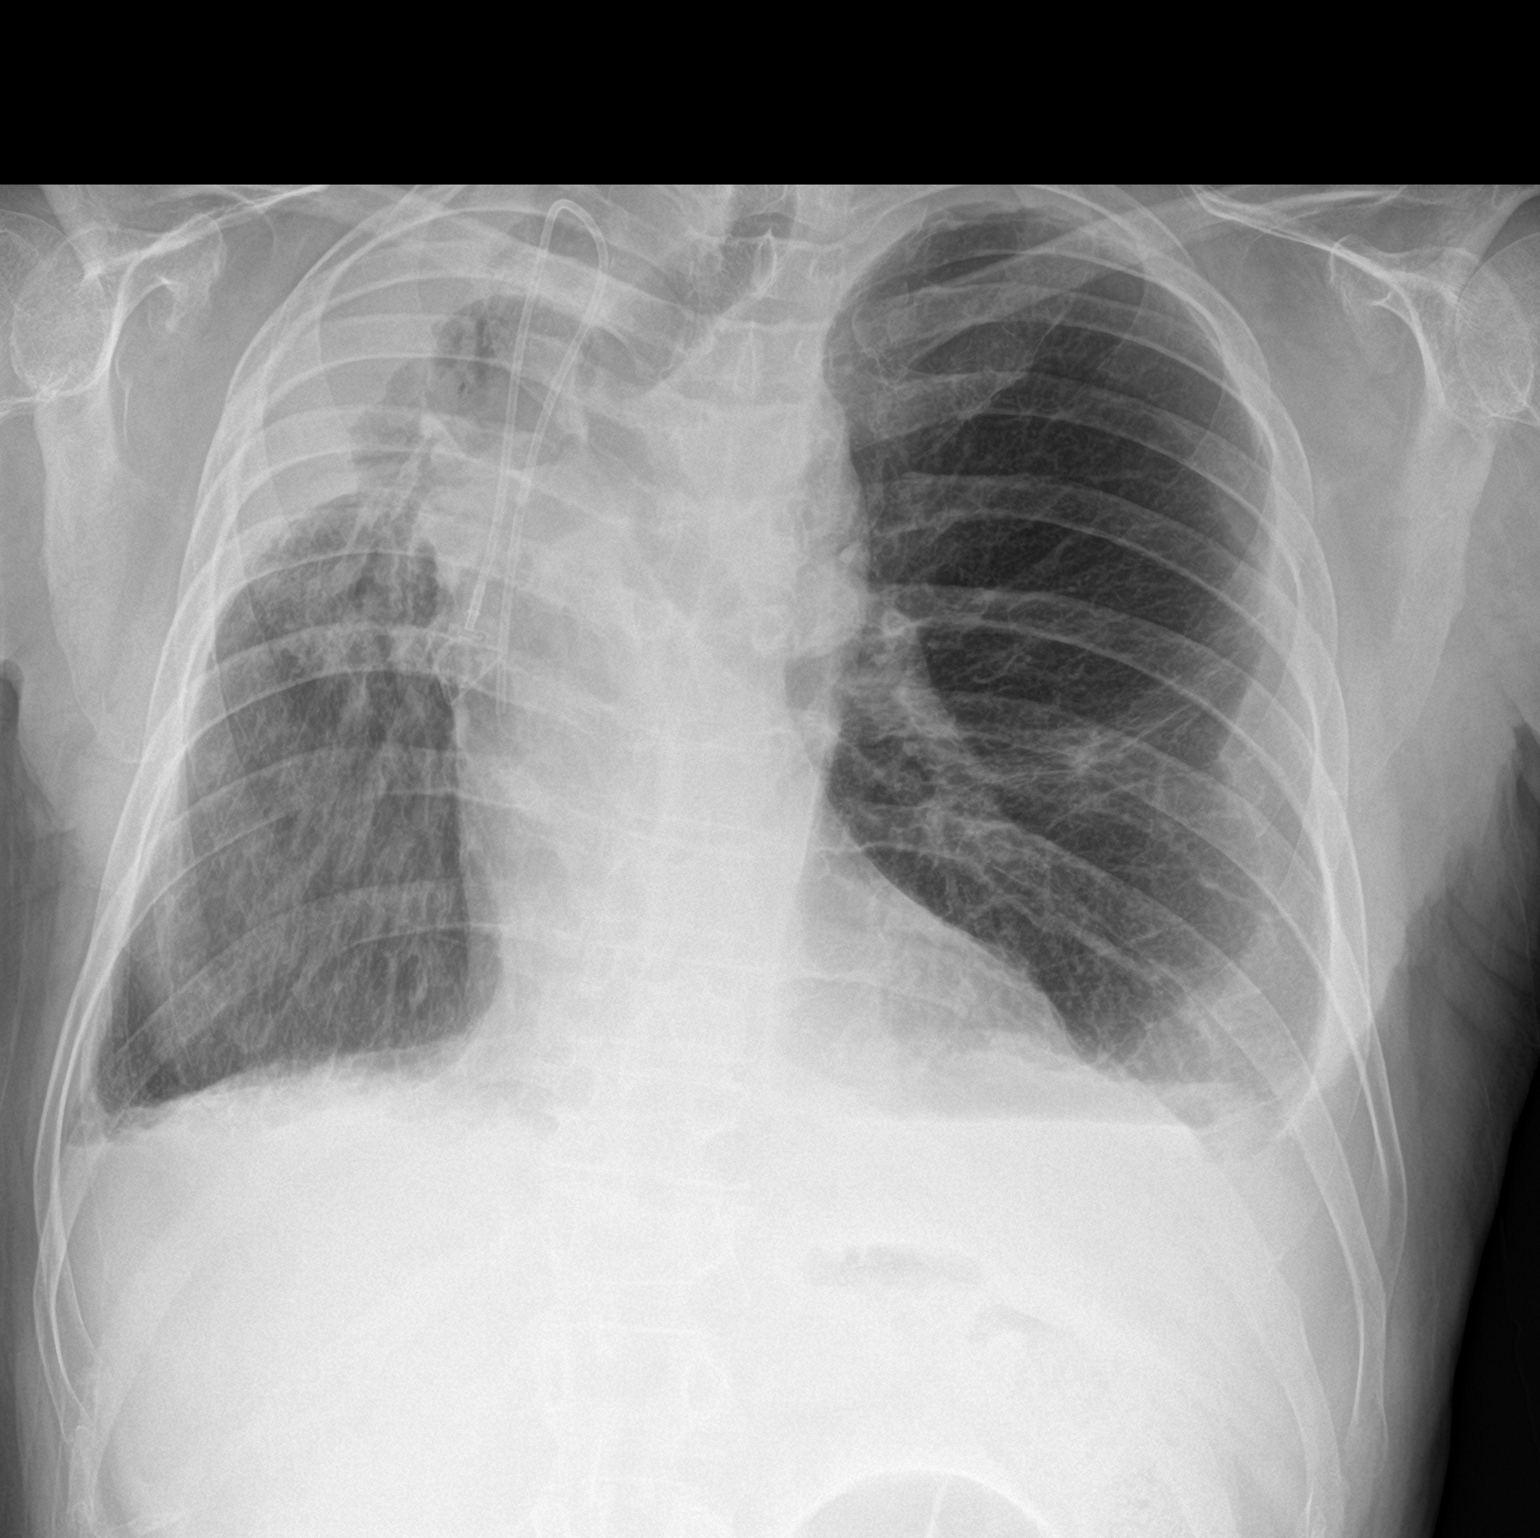

[chest lat]
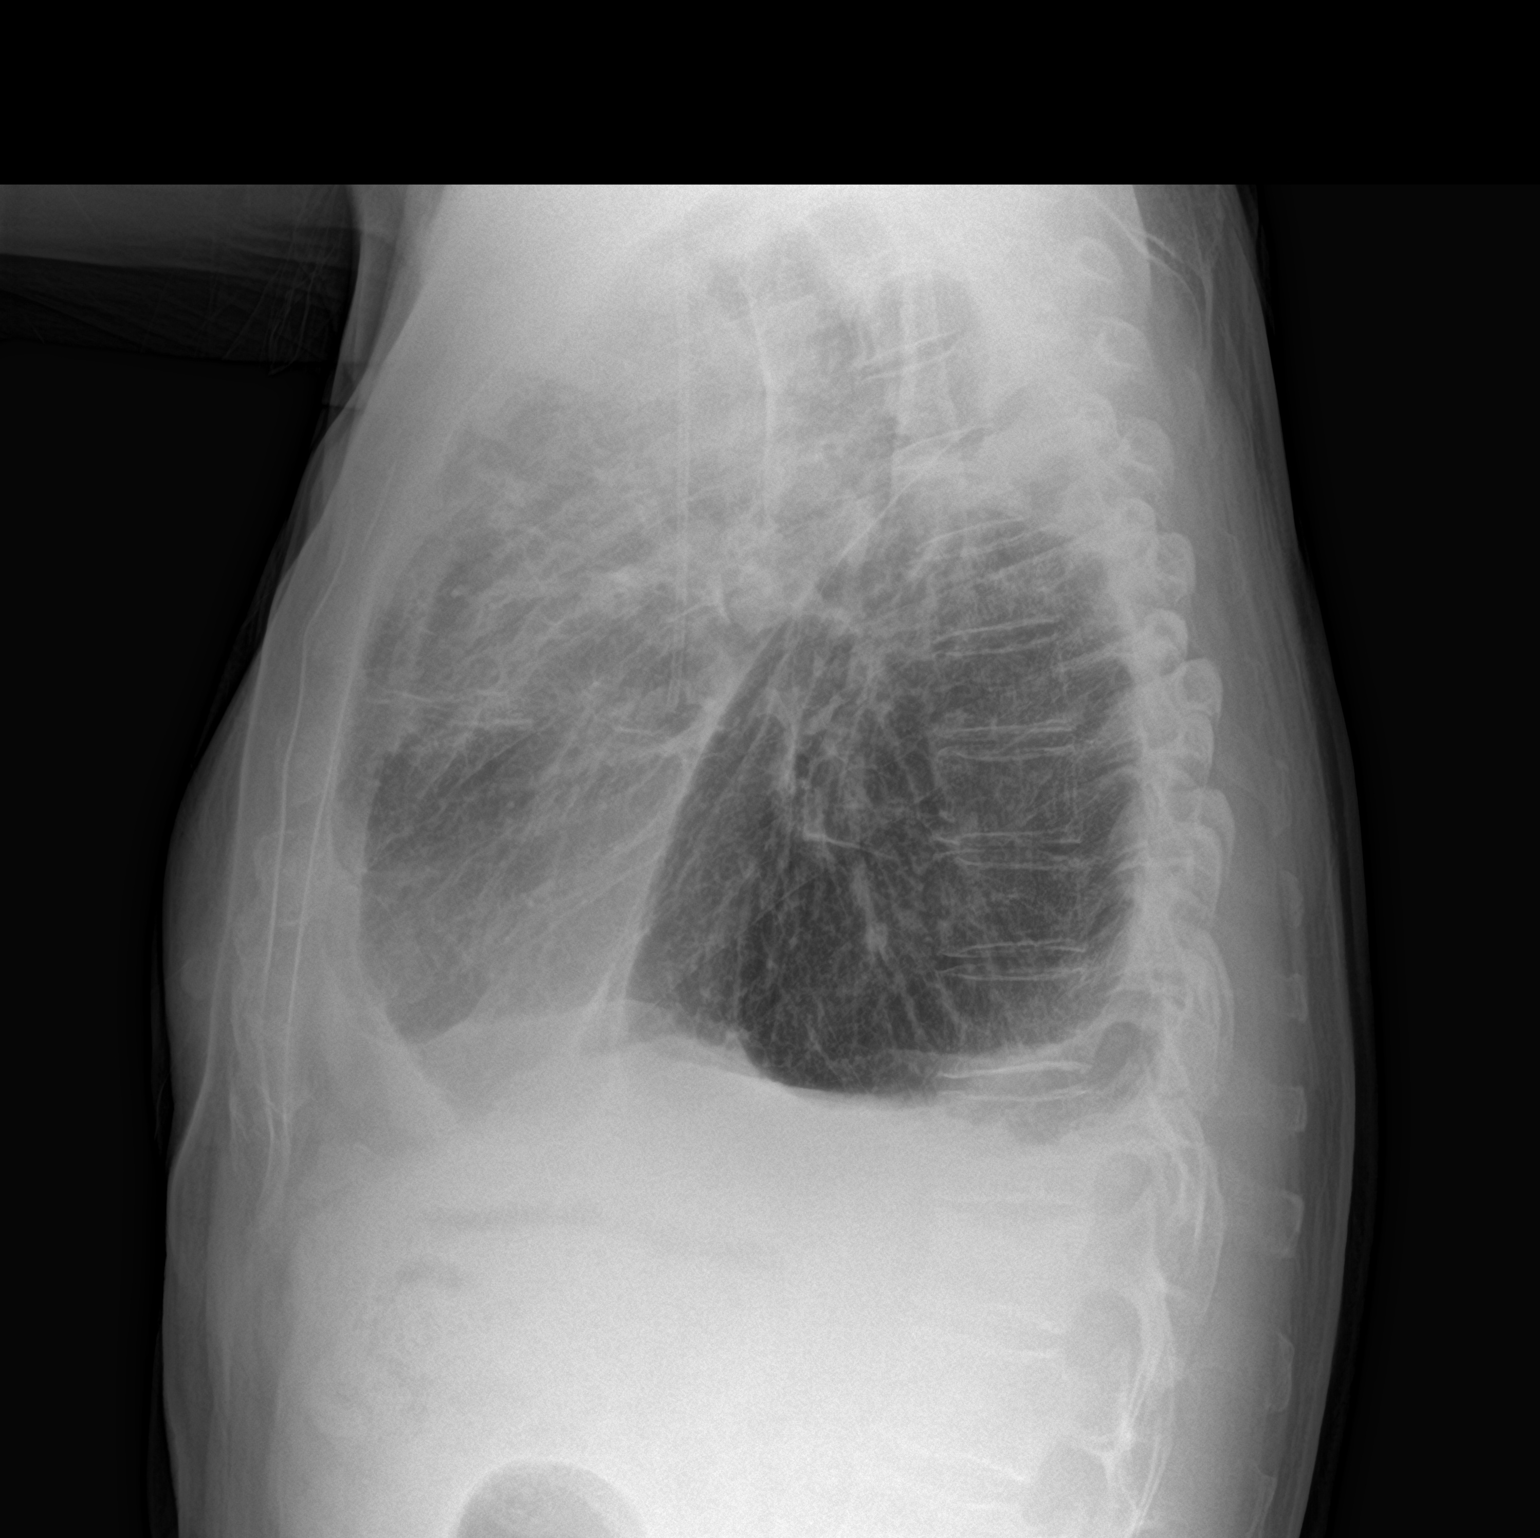

[2 of 2 positions shown; findings below may reference images not displayed]

FINDINGS: Pleuroparenchymal scarring right apex unchanged. Mild scarring in
the bases unchanged. Mild pleural scarring or fluid on the right
unchanged. Small left pleural effusion slightly larger. Negative for
heart failure.

Port-A-Cath tip SVC unchanged.
IMPRESSION: Pleuroparenchymal thickening right apex unchanged.

Small left pleural effusion slightly increased in size.

## 2021-07-15 MED ORDER — POTASSIUM CHLORIDE CRYS ER 20 MEQ PO TBCR: 20.0000 meq | EXTENDED_RELEASE_TABLET | Freq: Every day | ORAL | 0 refills | Status: DC

## 2021-07-15 MED ORDER — SODIUM CHLORIDE 0.9 % IV SOLN
500.0000 mg/m2 | Freq: Once | INTRAVENOUS | Status: AC
  Administered 2021-07-15: 1000 mg via INTRAVENOUS
  Filled 2021-07-15: qty 40

## 2021-07-15 MED ORDER — CYANOCOBALAMIN 1000 MCG/ML IJ SOLN
1000.0000 ug | Freq: Once | INTRAMUSCULAR | Status: AC
  Administered 2021-07-15: 1000 ug via INTRAMUSCULAR
  Filled 2021-07-15: qty 1

## 2021-07-15 MED ORDER — PROCHLORPERAZINE MALEATE 10 MG PO TABS
10.0000 mg | ORAL_TABLET | Freq: Once | ORAL | Status: AC
  Administered 2021-07-15: 10 mg via ORAL
  Filled 2021-07-15: qty 1

## 2021-07-15 MED ORDER — SODIUM CHLORIDE 0.9 % IV SOLN
200.0000 mg | Freq: Once | INTRAVENOUS | Status: AC
  Administered 2021-07-15: 200 mg via INTRAVENOUS
  Filled 2021-07-15: qty 200

## 2021-07-15 MED ORDER — HEPARIN SOD (PORK) LOCK FLUSH 100 UNIT/ML IV SOLN
500.0000 [IU] | Freq: Once | INTRAVENOUS | Status: AC | PRN
  Administered 2021-07-15: 500 [IU]

## 2021-07-15 MED ORDER — SODIUM CHLORIDE 0.9% FLUSH
10.0000 mL | INTRAVENOUS | Status: DC | PRN
  Administered 2021-07-15: 10 mL

## 2021-07-15 MED ORDER — SODIUM CHLORIDE 0.9 % IV SOLN: Freq: Once | INTRAVENOUS | Status: AC

## 2021-07-15 MED ORDER — SODIUM CHLORIDE 0.9% FLUSH
10.0000 mL | Freq: Once | INTRAVENOUS | Status: AC | PRN
  Administered 2021-07-15: 10 mL

## 2021-07-15 NOTE — Progress Notes (Signed)
Treatment given per orders. Patient tolerated it well without problems. Vitals stable and discharged home from clinic ambulatory. Follow up as scheduled.  

## 2021-07-15 NOTE — Patient Instructions (Signed)
[facility]  Discharge Instructions: ?Thank you for choosing [facility] to provide your oncology and hematology care.  ? ?If you have a lab appointment with the [facility], please go directly to the [facility] and check in at the registration area. ?  ?Wear comfortable clothing and clothing appropriate for easy access to any Portacath or PICC line.  ? ?We strive to give you quality time with your provider. You may need to reschedule your appointment if you arrive late (15 or more minutes).  Arriving late affects you and other patients whose appointments are after yours.  Also, if you miss three or more appointments without notifying the office, you may be dismissed from the clinic at the provider?s discretion.    ?  ?For prescription refill requests, have your pharmacy contact our office and allow 72 hours for refills to be completed.   ? ?Today you received the following chemotherapy and/or immunotherapy agents: Alimta and Keytruda    ?  ?To help prevent nausea and vomiting after your treatment, we encourage you to take your nausea medication as directed. ? ?BELOW ARE SYMPTOMS THAT SHOULD BE REPORTED IMMEDIATELY: ?*FEVER GREATER THAN 100.4 F (38 ?C) OR HIGHER ?*CHILLS OR SWEATING ?*NAUSEA AND VOMITING THAT IS NOT CONTROLLED WITH YOUR NAUSEA MEDICATION ?*UNUSUAL SHORTNESS OF BREATH ?*UNUSUAL BRUISING OR BLEEDING ?*URINARY PROBLEMS (pain or burning when urinating, or frequent urination) ?*BOWEL PROBLEMS (unusual diarrhea, constipation, pain near the anus) ?TENDERNESS IN MOUTH AND THROAT WITH OR WITHOUT PRESENCE OF ULCERS (sore throat, sores in mouth, or a toothache) ?UNUSUAL RASH, SWELLING OR PAIN  ?UNUSUAL VAGINAL DISCHARGE OR ITCHING  ? ?Items with * indicate a potential emergency and should be followed up as soon as possible or go to the Emergency Department if any problems should occur. ? ?Please show the CHEMOTHERAPY ALERT CARD or IMMUNOTHERAPY ALERT CARD at  check-in to the Emergency Department and triage nurse. ? ?Should you have questions after your visit or need to cancel or reschedule your appointment, please contact [facility]  Dept: [phone]  and follow the prompts.  Office hours are 8:00 a.m. to 4:30 p.m. Monday - Friday. Please note that voicemails left after 4:00 p.m. may not be returned until the following business day.  We are closed weekends and major holidays. You have access to a nurse at all times for urgent questions. Please call the main number to the clinic Dept: [phone] and follow the prompts. ? ? ?For any non-urgent questions, you may also contact your provider using MyChart. We now offer e-Visits for anyone 43 and older to request care online for non-urgent symptoms. For details visit [email or web address]. ?  ?Also download the MyChart app! Go to the app store, search "MyChart", open the app, select [facility], and log in with your MyChart username and password. ? ?Due to Covid, a mask is required upon entering the hospital/clinic. If you do not have a mask, one will be given to you upon arrival. For doctor visits, patients may have 1 support person aged 31 or older with them. For treatment visits, patients cannot have anyone with them due to current Covid guidelines and our immunocompromised population.  ? ?

## 2021-07-15 NOTE — Telephone Encounter (Signed)
I attempted to call the pt and it goes straight to VM with no mailbox setup. ? ?I then called pts wife who indicated pt was with her. I advised of his CXR results and no Thoracentesis is needed. Neutropenic, PE and MI precautions have been reviewed with the pt and his wife. Both expressed understanding of this information.  ?

## 2021-07-15 NOTE — Progress Notes (Signed)
Ok to treat with HR 110 per Cassie, PA ?

## 2021-07-20 ENCOUNTER — Encounter: Payer: Self-pay | Admitting: Internal Medicine

## 2021-07-21 ENCOUNTER — Other Ambulatory Visit: Payer: Self-pay | Admitting: Internal Medicine

## 2021-07-21 DIAGNOSIS — C3491 Malignant neoplasm of unspecified part of right bronchus or lung: Secondary | ICD-10-CM

## 2021-07-29 ENCOUNTER — Ambulatory Visit
Admission: RE | Admit: 2021-07-29 | Discharge: 2021-07-29 | Disposition: A | Discharge status: Home / Self Care | Payer: BC Managed Care – PPO | Source: Ambulatory Visit | Attending: Radiation Oncology | Admitting: Radiation Oncology

## 2021-07-29 DIAGNOSIS — C7931 Secondary malignant neoplasm of brain: Secondary | ICD-10-CM

## 2021-07-29 IMAGING — MR MR HEAD WO/W CM
12 series · 48 of 48 positions shown · IV contrast (18ml multi)
Comparison: MRI of the brain [DATE].

CLINICAL DATA: Metastasis to brain (HCC) [ZK] ([ZK]-CM). Assess
treatment response.

EXAM:
MRI HEAD WITHOUT AND WITH CONTRAST
TECHNIQUE: Multiplanar, multiecho pulse sequences of the brain and surrounding
structures were obtained without and with intravenous contrast.
CONTRAST:  18mL MULTIHANCE GADOBENATE DIMEGLUMINE 529 MG/ML IV SOLN

[Series 2: FLAIR · sagittal · 3.0mm · 0.75mm/px · 3 of 39 slices shown (1 of 2)]
[im 1/39]
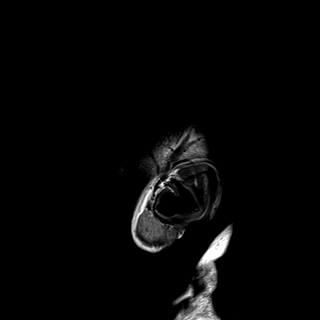
[im 20/39]
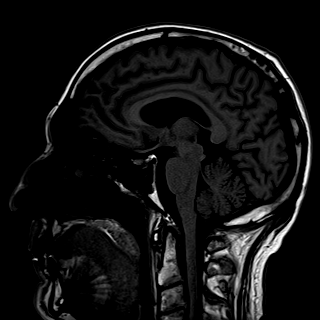
[im 39/39]
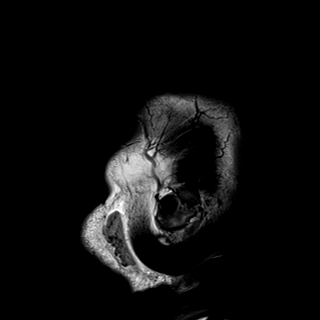

[Series 3: DWI · axial · 3.0mm · 1.50mm/px · z∈[-91,+80]mm · 4 of 90 slices shown (1 of 2)]
[im 1/90]
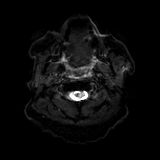
[im 30/90]
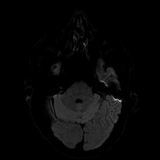
[im 60/90]
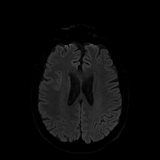
[im 90/90]
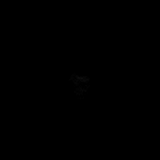

[Series 4: DWI · axial · 3.0mm · 1.50mm/px · z∈[-91,+80]mm · 2 of 43 slices shown (2 of 2)]
[im 1/43]
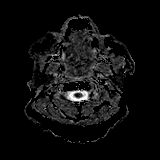
[im 43/43]
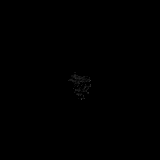

[Series 5: T2 · axial · 5.0mm · 0.57mm/px · 1 of 30 slices shown (1 of 2)]
[im 1/30]
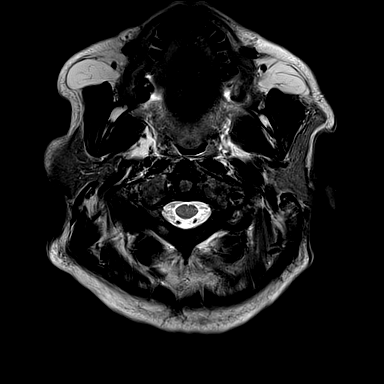

[Series 6: swi_images · axial · 1.5mm · 0.90mm/px · z∈[-80,+86]mm · 5 of 112 slices shown]
[im 1/112]
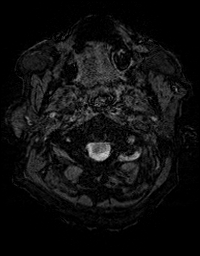
[im 28/112]
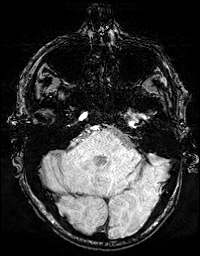
[im 56/112]
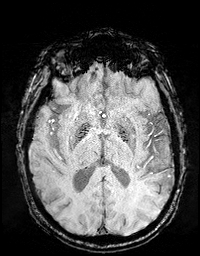
[im 84/112]
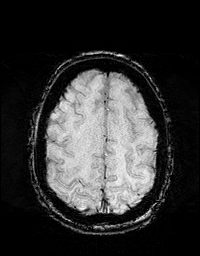
[im 112/112]
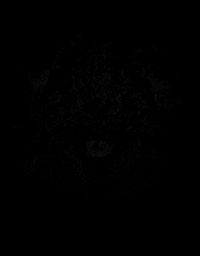

[Series 8: FLAIR · axial · 3.0mm · 0.86mm/px · z∈[-100,+83]mm · 3 of 62 slices shown (2 of 2)]
[im 1/62]
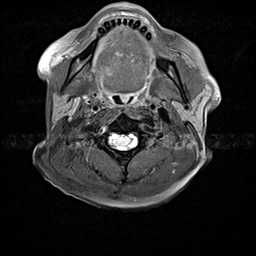
[im 31/62]
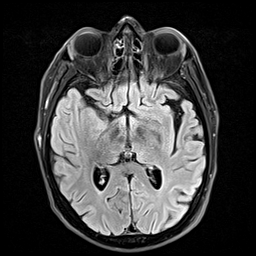
[im 62/62]
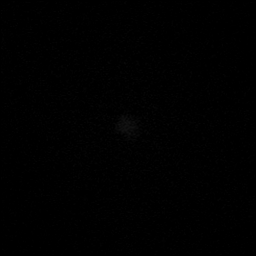

[Series 9: T2 · axial · non-contrast · 1.0mm · 0.86mm/px · z∈[-83,+89]mm · 8 of 176 slices shown (2 of 2)]
[im 1/176]
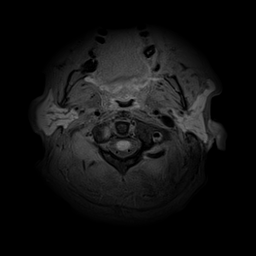
[im 26/176]
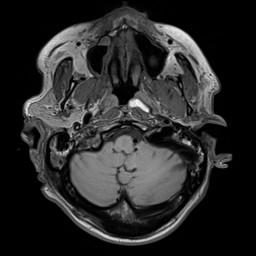
[im 51/176]
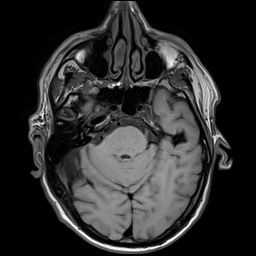
[im 76/176]
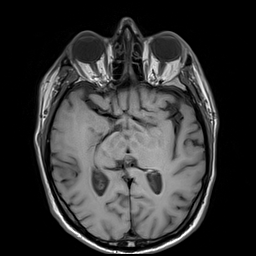
[im 101/176]
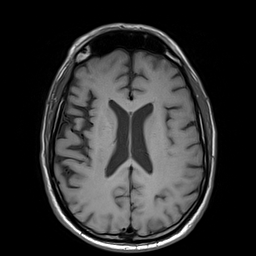
[im 126/176]
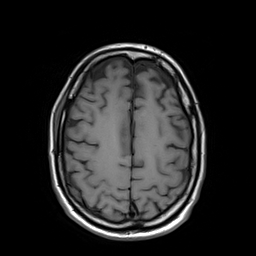
[im 151/176]
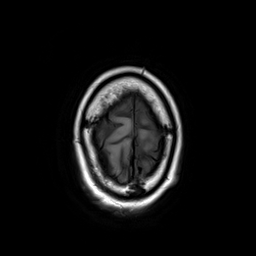
[im 176/176]
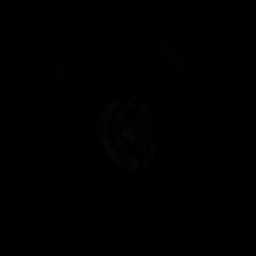

[Series 10: T2 post-contrast · coronal · 3.0mm · 0.57mm/px · 2 of 47 slices shown (1 of 2)]
[im 1/47]
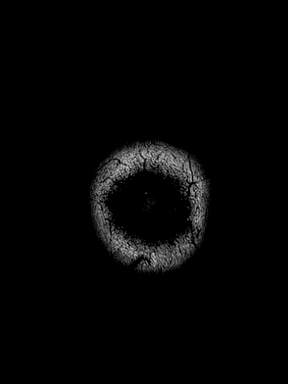
[im 47/47]
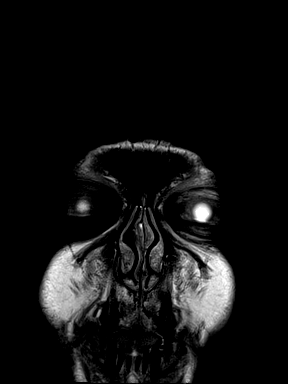

[Series 11: T2 post-contrast · axial · 1.0mm · 0.86mm/px · z∈[-83,+89]mm · 8 of 176 slices shown (2 of 2)]
[im 1/176]
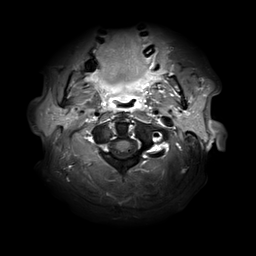
[im 26/176]
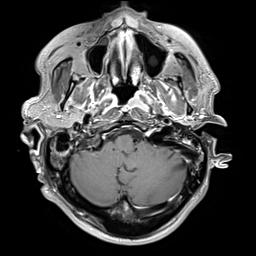
[im 51/176]
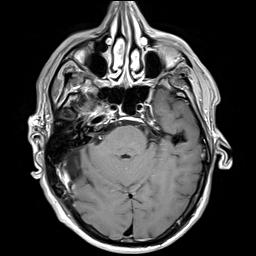
[im 76/176]
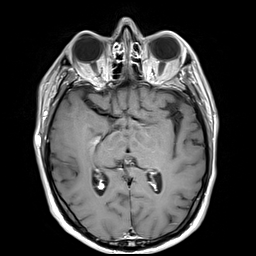
[im 101/176]
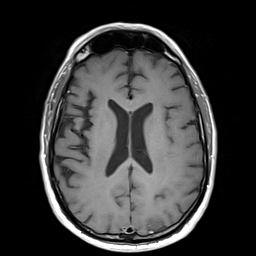
[im 126/176]
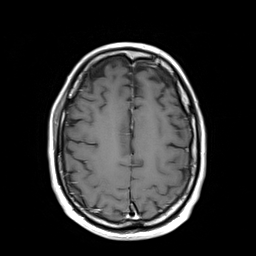
[im 151/176]
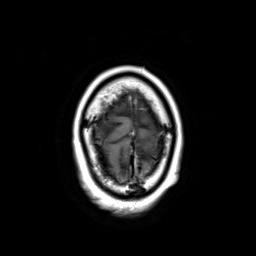
[im 176/176]
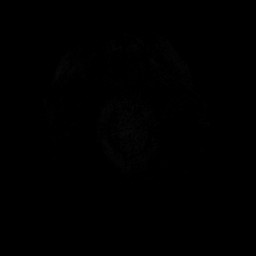

[Series 12: T1 post-contrast · axial · 1.0mm · 0.75mm/px · z∈[-84,+91]mm · 8 of 175 slices shown (1 of 2)]
[im 1/175]
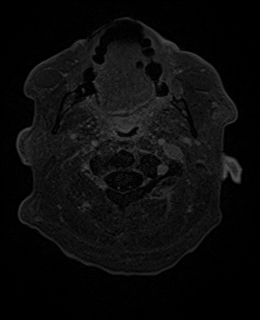
[im 25/175]
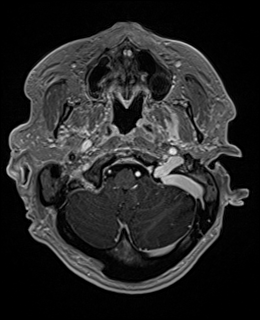
[im 50/175]
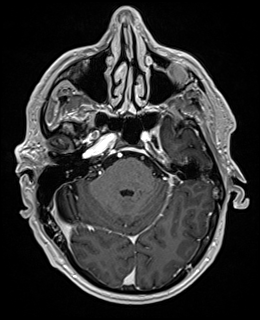
[im 75/175]
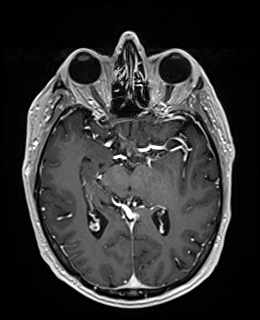
[im 100/175]
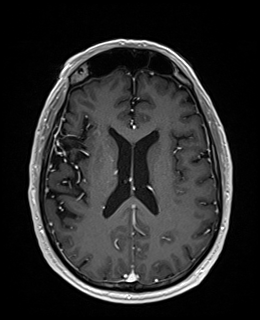
[im 125/175]
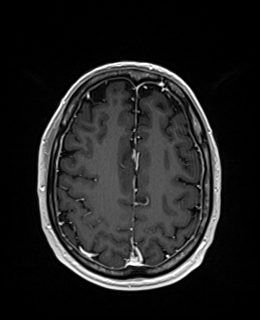
[im 150/175]
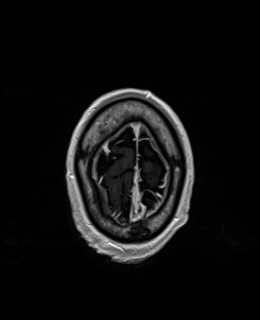
[im 175/175]
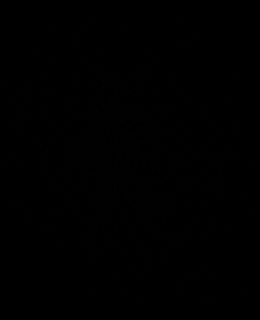

[Series 13: T1 post-contrast · coronal · 3.0mm · 0.57mm/px · 2 of 47 slices shown (2 of 2)]
[im 1/47]
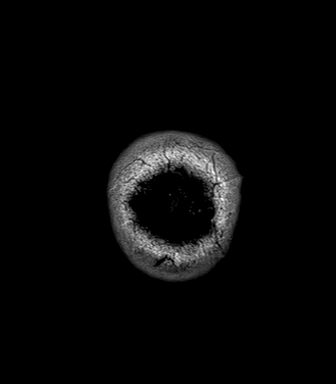
[im 47/47]
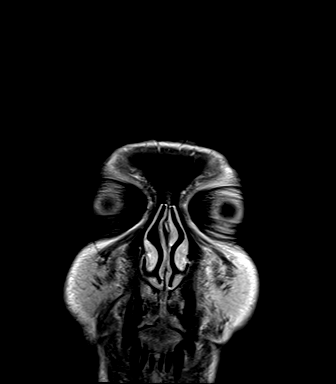

[Series 14: FLAIR post-contrast · sagittal · 3.0mm · 0.75mm/px · 2 of 39 slices shown]
[im 1/39]
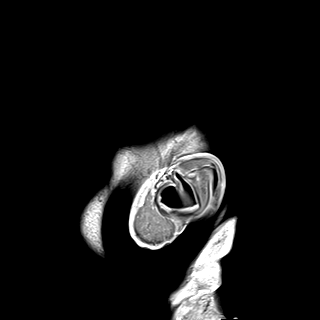
[im 39/39]
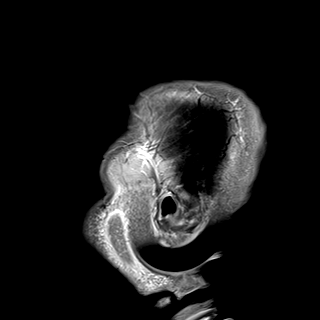

[48 of 48 positions shown; findings below may reference images not displayed]

FINDINGS: Brain: No acute infarction, hemorrhage, hydrocephalus or extra-axial
collection.

No interval change of the enhancing lesion in the inferior aspect of
the right cerebellar hemisphere measuring approximately 5 x 3 mm. No
associated edema or mass effect. No new enhancing lesion identified.

Small amount of scattered T2 hyperintense foci within the white
matter of the cerebral hemispheres, nonspecific, most likely related
to early chronic microangiopathy.

Vascular: Normal flow voids.

Skull and upper cervical spine: Normal marrow signal.

Sinuses/Orbits: Trace mucosal thickening throughout the paranasal
sinuses with mucous retention cyst the left maxillary sinus. The
orbits are maintained.

Other: Retention cyst of the left Rosenmller fossa.
IMPRESSION: Stable a 5 x 3 mm right cerebellar hemisphere enhancing lesion. No
new lesion identified.

## 2021-07-29 MED ORDER — GADOBENATE DIMEGLUMINE 529 MG/ML IV SOLN
18.0000 mL | Freq: Once | INTRAVENOUS | Status: AC | PRN
  Administered 2021-07-29: 18 mL via INTRAVENOUS

## 2021-07-30 ENCOUNTER — Ambulatory Visit (INDEPENDENT_AMBULATORY_CARE_PROVIDER_SITE_OTHER): Payer: BC Managed Care – PPO

## 2021-07-30 ENCOUNTER — Encounter: Payer: Self-pay | Admitting: Primary Care

## 2021-07-30 ENCOUNTER — Ambulatory Visit: Payer: BC Managed Care – PPO | Admitting: Primary Care

## 2021-07-30 VITALS — BP 128/76 | HR 115 | Temp 98.6°F | Ht 69.0 in | Wt 185.4 lb

## 2021-07-30 DIAGNOSIS — J90 Pleural effusion, not elsewhere classified: Secondary | ICD-10-CM | POA: Diagnosis not present

## 2021-07-30 DIAGNOSIS — C3491 Malignant neoplasm of unspecified part of right bronchus or lung: Secondary | ICD-10-CM

## 2021-07-30 IMAGING — DX DG CHEST 2V
2 series · 2 of 2 positions shown · non-contrast
Comparison: [DATE]

CLINICAL DATA: Left pleural effusion.  Shortness of breath.

EXAM:
CHEST - 2 VIEW

[chest pa]
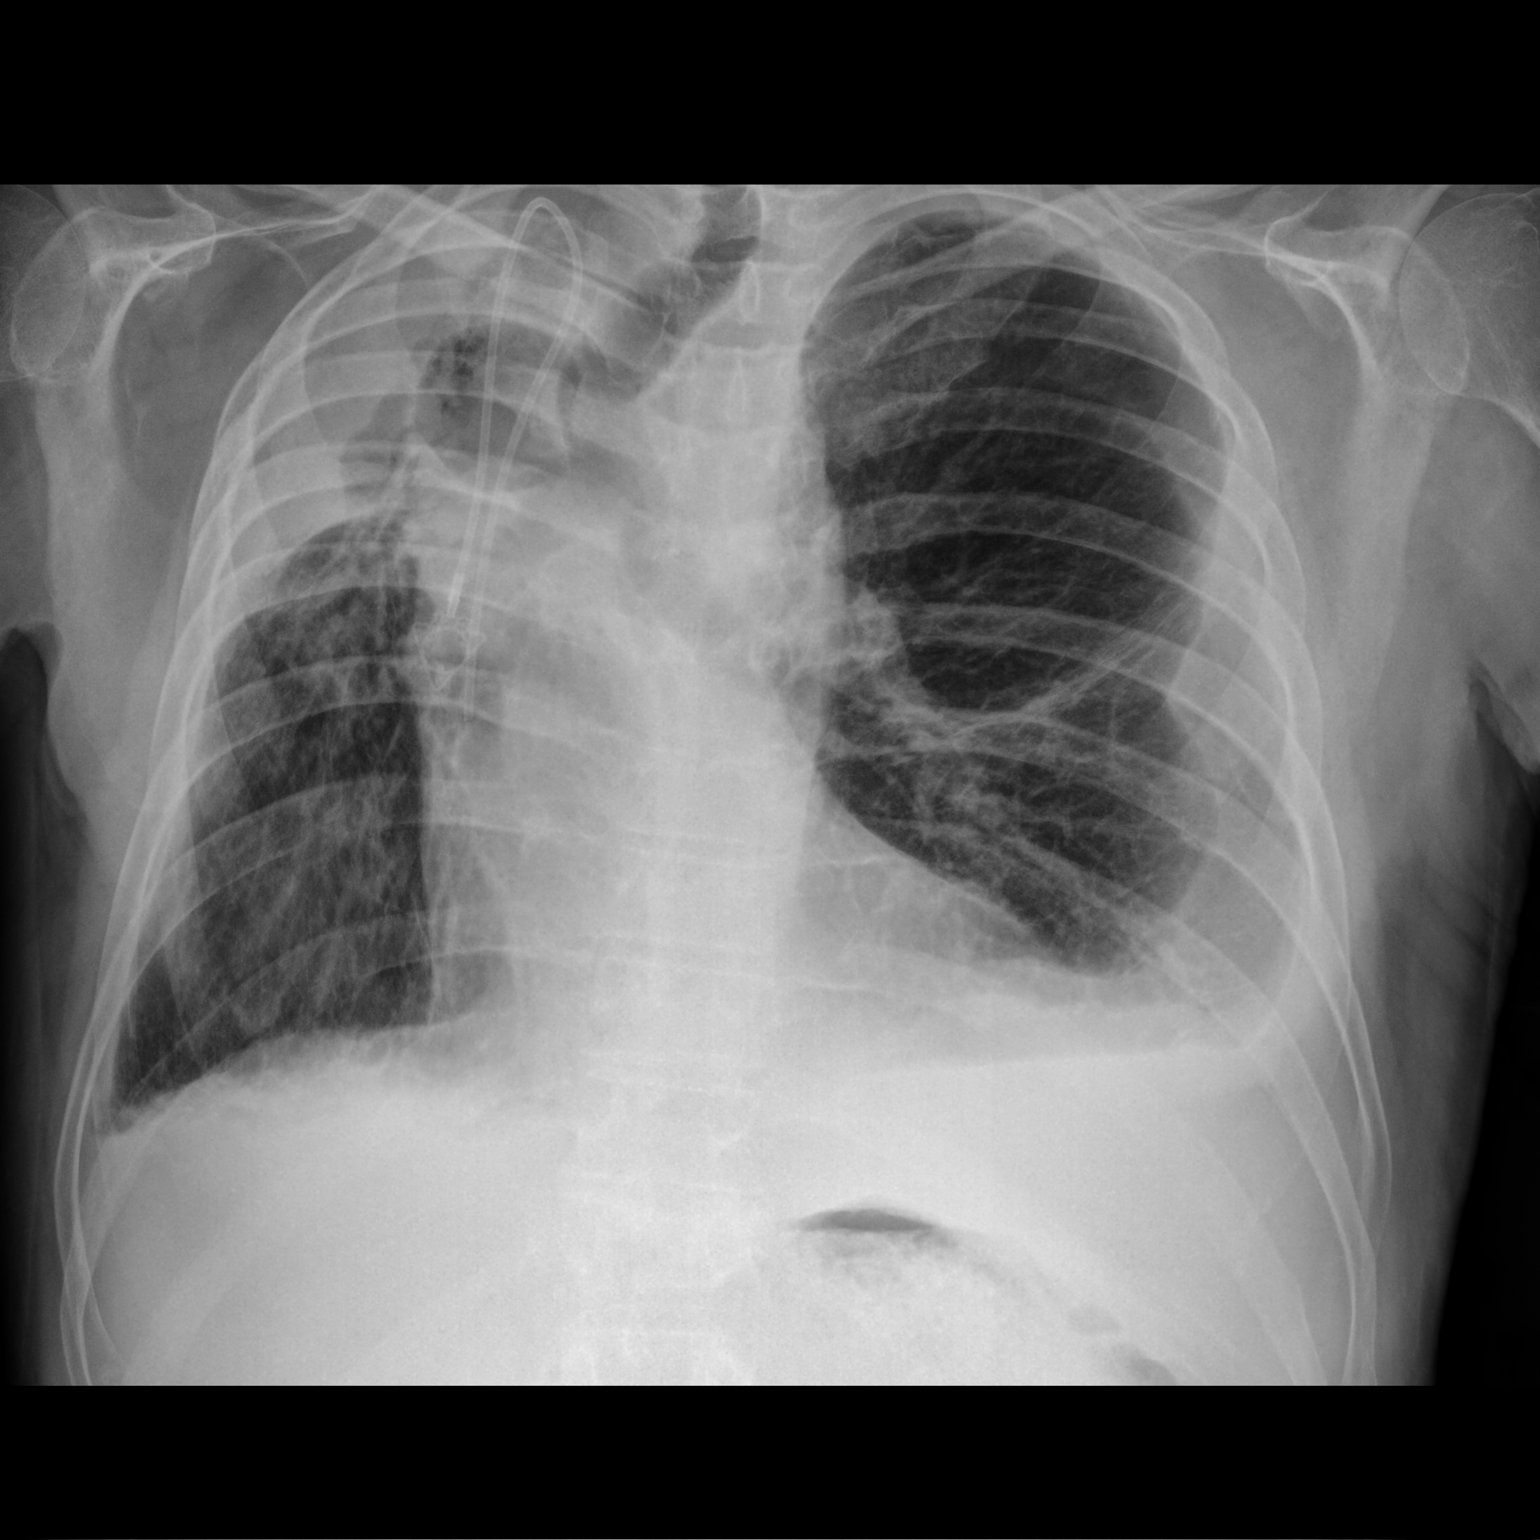

[chest lat]
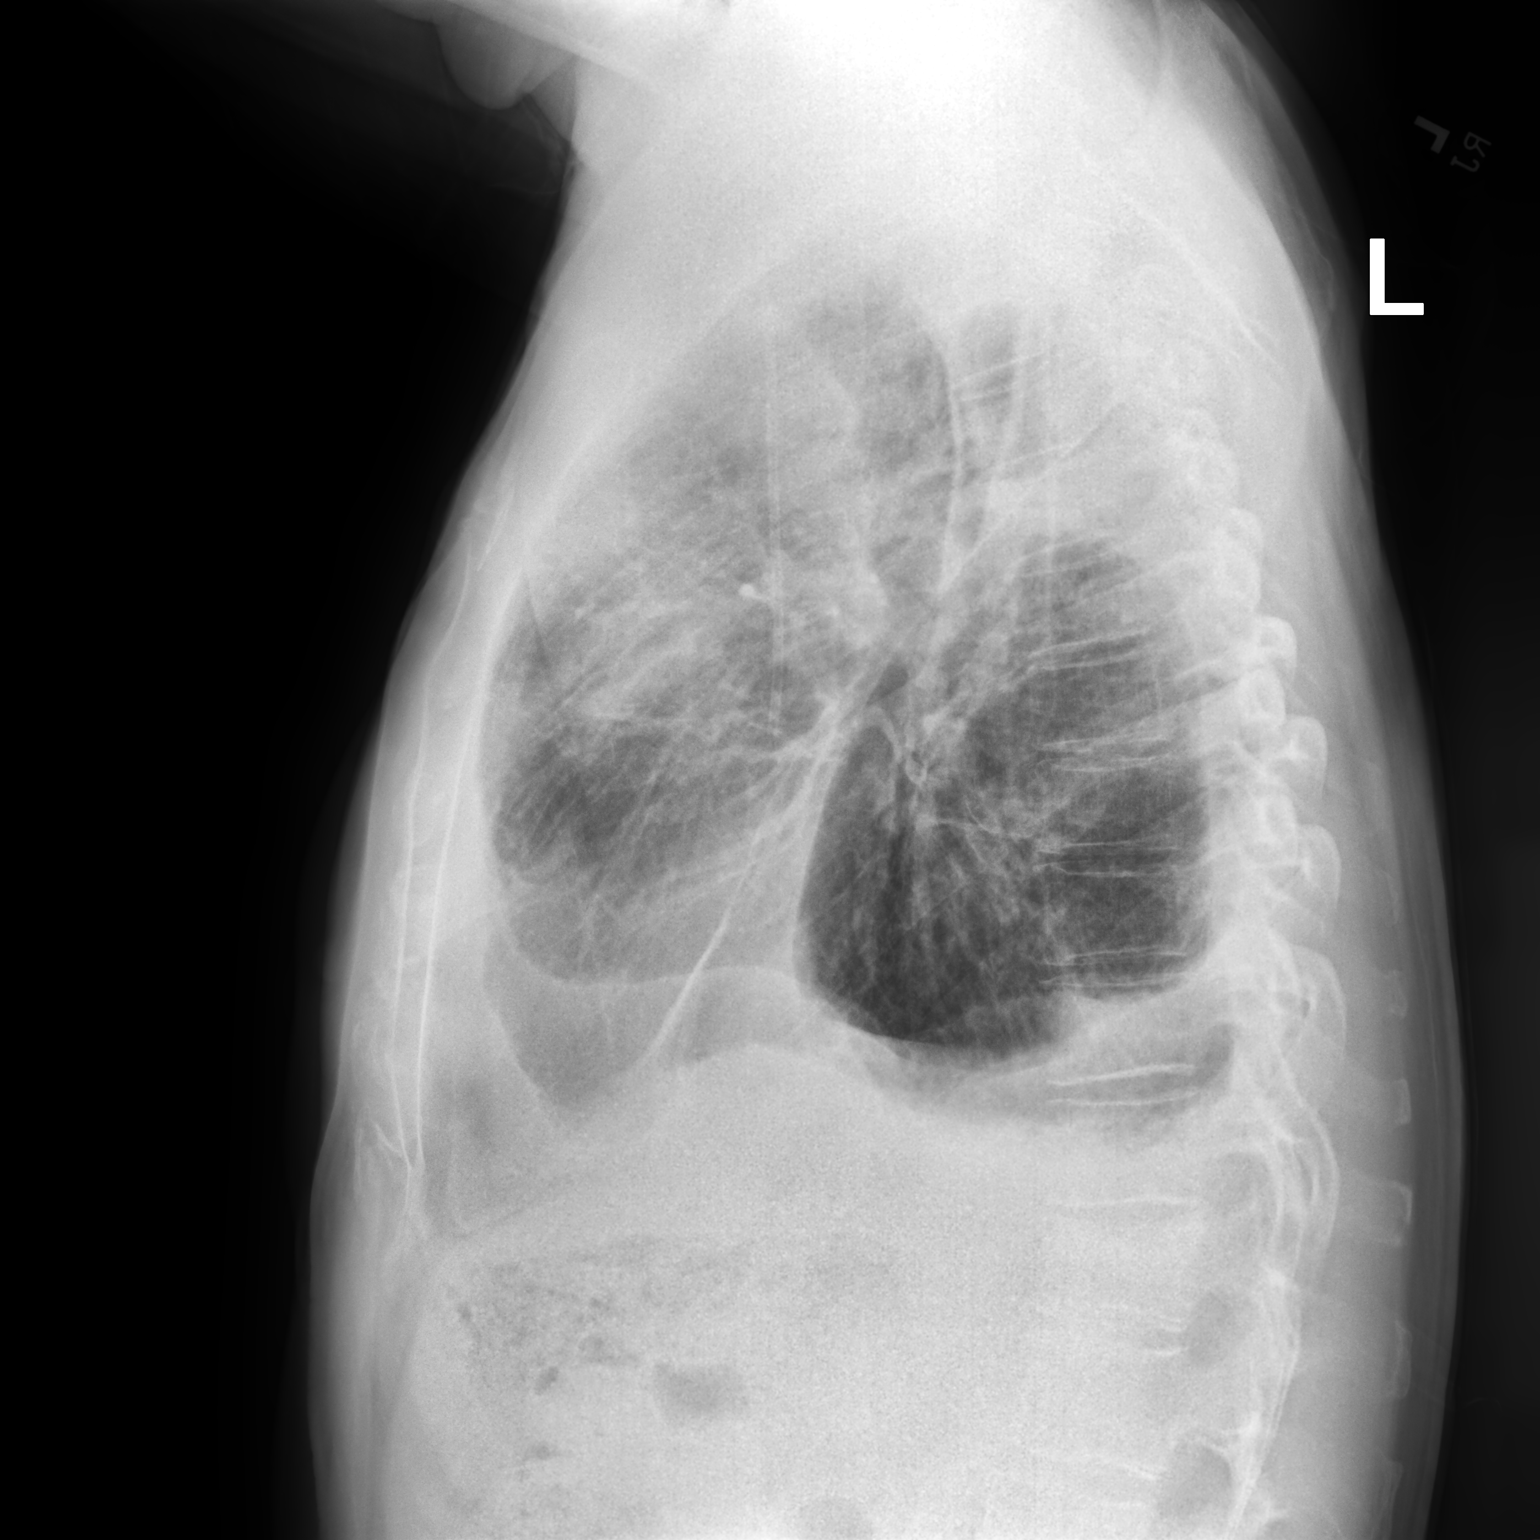

[2 of 2 positions shown; findings below may reference images not displayed]

FINDINGS: Volume loss right hemithorax is stable with extensive
pleuroparenchymal scarring in the right apex. Small left pleural
effusion is similar to prior, likely with loculated component. The
cardiopericardial silhouette is within normal limits for size. Bones
are diffusely demineralized.
IMPRESSION: Similar appearance of small left pleural effusion, likely with
loculated component.

## 2021-07-30 NOTE — Patient Instructions (Addendum)
?After CXR results I will discuss with Dr. Silas Flood to see if he recommends repeating thoracentesis or wants to put in indwelling catheter called pleurX  ? ?Orders: ?CXR re: left pleural effusion  ? ? ?Indwelling Pleural Catheter Home Guide ?An indwelling pleural catheter is a thin, flexible tube that is inserted under your skin and into your chest. The catheter drains excess fluid that collects in the area between the chest wall and the lungs (pleural space).After the catheter is inserted, it can be attached to a container that collects fluid. ?The pleural catheter will allow you to drain fluid from your chest at home on a regular basis (sometimes daily). This will eliminate the need for frequent visits to the hospital or clinic to drain the fluid. The catheter may be removed after the excess fluid problem is resolved, usually after 2-3 months. It is important to follow instructions from your health care provider about how to drain and care for your catheter. ?What are the risks? ?Generally, this is a safe procedure. However, problems may occur, including: ?Infection. ?Skin damage around the catheter. ?Lung damage. ?Failure of the pleural catheter to work properly. ?Spreading of cancer cells along the catheter, if you have cancer. ?Supplies needed: ?Vacuum-sealed drainage container with attached drainage line. ?Germ-free (sterile) dressing. ?Sterile alcohol pads. ?Sterile gloves. ?Valve cap. ?Sterile gauze pads, 4 inches ? 4 inches (10 cm ? 10 cm). ?Tape. ?Adhesive dressing. ?Sterile foam catheter pad. ?How to care for your catheter and insertion site ?Wash your hands with soap and warm water for at least 20 seconds before and after touching the catheter or insertion site. If soap and water are not available, use hand sanitizer. ?Change your dressing regularly, usually every week, or as often as told by your health care provider. ?Keep the skin around the catheter clean and dry. ?Check the catheter regularly for  any cracks or kinks in the tubing. ?Check your catheter insertion site every day for signs of infection. Check for: ?Redness, swelling, or pain. ?Fluid or blood. ?Warmth. ?Pus or a bad smell. ?How to drain your catheter ?Follow instructions from your health care provider about how often to drain your catheter. You may also refer to instructions that come with the pleural catheter drainage system. To drain the catheter: ?Wash your hands with soap and warm water for at least 20 seconds. If soap and water are not available, use hand sanitizer. ?Carefully remove the dressing from around the catheter. Do not discard unless performing a dressing change. ?Wash your hands again. ?Put on sterile gloves. ?Prepare the vacuum-sealed drainage container and drainage line. Close the drainage line of the vacuum-sealed drainage container by squeezing the pinch clamp or rolling the wheel of the roller clamp toward the container. The vacuum in the container will be lost if the line is not closed completely. ?Remove the access tip cover from the drainage line. Do not touch the end. Set it on a sterile surface. ?Remove the catheter valve cap and throw it away. ?Use an alcohol pad to clean the end of the catheter. ?Insert the access tip into the catheter valve. Make sure the valve and access tip are securely connected. Listen for a click to confirm that they are connected. ?Insert the T plunger to break the vacuum seal on the drainage container. ?Open the clamp on the drainage line. ?Allow the catheter to drain. Keep the catheter and the drainage container below the level of your chest. There may be a one-way valve on the end  of the tubing that will allow liquid and air to flow out of the catheter without letting air inside. ?Drain the amount of fluid as told by your health care provider. It usually takes 5-15 minutes. Do not drain more than 1000 mL of fluid. You may feel a little discomfort while you are draining. If the pain is severe,  stop draining and contact your health care provider. ?After you finish draining the catheter, remove the drainage container tubing from the catheter. ?Use a clean alcohol pad to wipe the catheter tip. ?Place a clean cap on the end of the catheter. ?Use an alcohol pad to clean the skin around the catheter. ?Allow the skin to air-dry. ?Put the catheter pad on your skin. Curl the catheter into loops and place it on the pad. Do not place the catheter on your skin. ?Replace the dressing over the catheter. ?Discard the drainage container as instructed by your health care provider. Do not reuse the drainage container. ?Wash your hands with soap and warm water for at least 20 seconds. If soap and water are not available, use hand sanitizer. ?How to change your dressing ?Change your dressing at least once a week, or more often if needed to keep the dressing dry. Be sure to change the dressing whenever it becomes moist. Your health care provider will tell you how often to change your dressing. ?Wash your hands with soap and warm water for at least 20 seconds. If soap and water are not available, use hand sanitizer. ?Gently remove the old dressing. Avoid using scissors to remove the dressing. Sharp objects may damage the catheter. ?Wash the skin around the insertion site with mild, fragrance-free soap and warm water. Rinse well, then pat the area dry with a clean cloth. ?If your catheter was stitched (sutured) to your skin, look at the suture to make sure it is still anchored in your skin. ?Do not apply creams, ointments, or alcohol to the area. Let your skin air-dry completely before you apply a new dressing. ?Curl the catheter into loops and place it on the sterile catheter pad. Do not place the catheter on your skin. ?If you do not have a pad, use a clean dressing. Slide the dressing under the disk that holds the drainage catheter in place. ?Use clean gauze to cover the catheter and the catheter pad. The catheter should  rest on the pad or dressing, not on your skin. ?Tape the dressing to your skin. You may be instructed to use an adhesive dressing covering instead of gauze and tape. ?Wash your hands with soap and warm water for at least 20 seconds. If soap and water are not available, use hand sanitizer. ?General tips and recommendations ?Always wash your hands with soap and warm water for at least 20 seconds before and after caring for your catheter and drainage container. Use a mild, fragrance-free soap. If soap and water are not available, use hand sanitizer. ?Always make sure there are no leaks or loose connections in the catheter or drainage container. ?Each time you drain the catheter, note the color and amount of fluid. ?Do not take baths, swim, or use a hot tub until your health care provider approves. Ask your health care provider if you may take showers. You may only be allowed to take sponge baths. ?Take deep breaths regularly, followed by a cough. Doing this can help to prevent lung infection. ?Contact a health care provider if: ?You still have pain at the catheter insertion site more  than 2 days after your procedure. ?You have pain while draining your catheter. ?You have redness, swelling, or pain around your catheter insertion site. ?You have fluid or blood coming from your catheter insertion site. ?Your catheter insertion site feels warm to the touch. ?You have pus or a bad smell coming from your catheter insertion site. ?You have a fever. ?Your catheter becomes bent, twisted, or cracked. ?Get help right away if: ?You have chest pain. ?You have dizziness or shortness of breath. ?The catheter comes out. ?The catheter is blocked or clogged. ?These symptoms may be an emergency. Get help right away. Call 911. ?Do not wait to see if the symptoms will go away. ?Do not drive yourself to the hospital. ?Summary ?An indwelling pleural catheter is a thin, flexible tube that is inserted under your skin and into your chest to  drain excess fluid that collects in the pleural space. ?It is important to follow instructions from your health care provider about how to drain and care for your catheter. ?Do not touch the tip of the cat

## 2021-07-30 NOTE — Assessment & Plan Note (Addendum)
-   Left pleural effusion noted on CT scan 04/13/21 and again on 06/2021. 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 S/p 2 thoracenteses, last being on 06/30/21 where they removed 1.6L. Fluid has returned in the interim. He reports worsening dyspnea symptoms x 2-3 days. CXR today 07/30/2021 showed stable small left sided pleural effusion with loculated component. Likely needs repeat thoracentesis vs indwelling catheter placement. Discussed this with patient, he would prefer to undergo 3rd thoracentesis before considering pleurx cath placement. I will discuss with Dr. Silas Flood.  ? ? ? ?

## 2021-07-30 NOTE — Progress Notes (Signed)
? ?@Patient  ID: Richard Li, male    DOB: [birth year], 61 y.o.   MRN: [id number] ? ?Chief Complaint  ?Patient presents with  ? Follow-up  ?  Follow up. Pt is having some SOB and pt states it seems to be getting worse. Pt states he has a little bit of fluid in his lungs per a scan a few weeks ago. Pt states he has been using inhalers more frequently.   ? ? ?Referring provider: ?[name], DO ? ?HPI: ?61 year male, former smoker. PMH significant for hypertension, allergic rhinitis, COPD, adenocarcinoma right lung, recurrent pleural effusion, GERD. Patient of Dr. Silas Flood, last seen on 07/01/21.  ? ?Previous [facility] pulmonary encounter: ?07/01/21- Dr. Silas Flood  ?61 y.o. man with diagnosed right-sided lung cancer 12/2019 via bronchoscopy/EBUS who spent essentially all of November 2021 in the hospital for recurrent right-sided pneumonia whom are seeing in follow-up of the same. ?  ?Continues on doxycycline twice daily and daily prednisone 10 mg.  Since last visit, did do a thoracentesis.  This improved his dyspnea.  Is about a month ago.  Unfortunate developed worsening shortness of breath.  CT scan for staging/surveillance of cancer demonstrated recurrence of left-sided pleural effusion.  Had this drained again yesterday, 1.6 L removed.  Some soreness in that area but otherwise breathing does feel bit better. ?  ?Overall, cough congestion has improved with continuing doxycycline and prednisone.. ?  ?  ?HPI at initial visit: ?Started with productive cough it seems early November.  This is prompted 4 separate hospitalizations and multiple antibiotic courses.  He had recently completed first cycle of carboplatin/paclitaxel.  Was in the midst of radiation therapy to the right upper lobe mass which he completed around Thanksgiving 2021.  He has had cough productive of green sputum.  It is foul tasting.  Smells bad.  Despite multiple rounds of antibiotic is not improved.  His shortness of breath is worsening.  The most recent  hospitalization 03/2020 a bronchoscopy was pursued. Mucopurulent secretions noted emanating from RUL. Secretions likely aspirated seen in BI, RLL. Culture with OP flora. No cell counts sent. ? Continually coughing, producing sputum as above. Worried because not getting chemotherapy for cancer. Recently completed coures 14 day prednisone for possible radiation pneumonitis. This did not help symptoms. Finishing course of augmentin. Not seen improvement. ?  ?Serial CXRs reviewed which show persistent and worsening R mid and upper lung field opacities. CT 02/2020 reviewed with necrotic appearing mass near RUL takeoff centrally, dense consolidations posterior RUL and throughout R with scattered GGOs in left lung.  ?  ?Plan/Assessment ?Productive cough, CT/CXR infiltrate: Continue doxycycline and prednisone.  Have trialed stopping doxycycline twice now without improvement, interval worsening of symptoms but seems.  Continue prednisone 10 mg daily.  Symptoms improved overall over more recent symptoms. ?  ?Dyspnea exertion, weakness due to left-sided pleural effusion: Worsening recently.  Pleural effusion on left 03/2021 CT scan.  And again 06/2021.  Suspect is malignant given lymphocyte predominance although negative cytology.  Improved with thoracentesis 05/2020.  Fluid has recurred in interim status post thoracentesis 06/30/2021.  Encouraged to contact myself or Dr. Earlie Server if he feels symptomatic in the future and we can repeat thoracentesis as needed.  Briefly discussed indwelling catheter as well.  Saba/sama inhaler refilled today. ?  ? ?07/30/2021 ?Patient presents today for acute visit d/t shortness of breath. Diagnosed right-sided lung cancer 12/2019 via bronchoscopy/EBUS. Following with Dr. Earlie Server, most recent CT scan in March 2023 showed no concerning findings for  disease progression except for enlarging left pleural effusion. Currently treatment with maintenance Alimta and Keytruda. He reports being more short  winded the last 2-3 days. Using combivent rescue inhaler more frequently. His last chemo dose was 2 weeks ago. Last thoracentesis was on 06/30/21, 1.6L was removed.  ? ?Allergies  ?Allergen Reactions  ? Penicillins   ?  Unknown reaction  ? Amoxicillin Rash  ? ? ?Immunization History  ?Administered Date(s) Administered  ? Influenza Split 01/25/2012  ? Influenza Whole 02/13/2009, 01/25/2010  ? Influenza,inj,Quad PF,6+ Mos 03/30/2015, 02/16/2016, 02/27/2017, 01/24/2018, 03/01/2019, 02/10/2020, 02/16/2021  ? Influenza,inj,quad, With Preservative 01/23/2017  ? Tdap 12/12/2011  ? ? ?Past Medical History:  ?Diagnosis Date  ? Allergic rhinitis, cause unspecified   ? Anxiety state, unspecified   ? Asthma   ? as a child  ? Chronic airway obstruction, not elsewhere classified   ? Esophageal reflux   ? History of kidney stones   ? History of radiation therapy 02/13/2020-03/24/2020  ? right lung        Dr Gery Pray  ? Hypertension   ? Lumbago   ? lung ca dx'd 12/2019  ? Other and unspecified hyperlipidemia   ? Other chest pain   ? ? ?Tobacco History: ?Social History  ? ?Tobacco Use  ?Smoking Status Former  ? Packs/day: 2.00  ? Years: 28.00  ? Pack years: 56.00  ? Types: Cigarettes  ? Quit date: 04/25/2005  ? Years since quitting: 16.2  ?Smokeless Tobacco Never  ? ?Counseling given: Not Answered ? ? ?Outpatient Medications Prior to Visit  ?Medication Sig Dispense Refill  ? acetaminophen (TYLENOL) 500 MG tablet Take 1,000 mg by mouth every 8 (eight) hours as needed for moderate pain or mild pain.    ? dextromethorphan-guaiFENesin (MUCINEX DM) 30-600 MG 12hr tablet Take 1 tablet by mouth 2 (two) times daily.    ? doxycycline (VIBRA-TABS) 100 MG tablet Take 1 tablet by mouth twice daily 60 tablet 3  ? feeding supplement (ENSURE ENLIVE / ENSURE PLUS) LIQD Take 237 mLs by mouth 3 (three) times daily between meals. (Patient taking differently: Take 237 mLs by mouth daily.) 237 mL 12  ? fenofibrate 160 MG tablet Take 1 tablet by mouth  once daily 90 tablet 0  ? ferrous sulfate 325 (65 FE) MG tablet Take 1 tablet (325 mg total) by mouth daily with breakfast. 30 tablet 0  ? folic acid (FOLVITE) 1 MG tablet Take 1 tablet by mouth once daily 30 tablet 0  ? Ipratropium-Albuterol (COMBIVENT RESPIMAT) 20-100 MCG/ACT AERS respimat INHALE 1 TO 2 PUFFS BY MOUTH EVERY 6 HOURS AS NEEDED FOR WHEEZING 4 g 5  ? lactulose (CHRONULAC) 10 GM/15ML solution TAKE 15 MLS BY MOUTH 3 TIMES DAILY Strength: 10 GM/15ML 236 mL 0  ? lidocaine-prilocaine (EMLA) cream Apply 1 application topically as needed. 30 g 2  ? LORazepam (ATIVAN) 1 MG tablet Take 1 tablet (1 mg total) by mouth every 8 (eight) hours as needed for anxiety. 90 tablet 5  ? Multiple Vitamin (MULTIVITAMIN WITH MINERALS) TABS tablet Take 1 tablet by mouth daily.    ? ondansetron (ZOFRAN) 8 MG tablet Take 1 tablet (8 mg total) by mouth every 8 (eight) hours as needed for nausea or vomiting. Starting day 3 after chemotherapy 30 tablet 2  ? oxyCODONE-acetaminophen (PERCOCET/ROXICET) 5-325 MG tablet Take 1 tablet by mouth every 6 (six) hours as needed for severe pain. 30 tablet 0  ? potassium chloride SA (KLOR-CON M) 20 MEQ tablet Take  1 tablet (20 mEq total) by mouth daily. 5 tablet 0  ? predniSONE (DELTASONE) 10 MG tablet Take 1 tablet (10 mg total) by mouth daily with breakfast. 90 tablet 3  ? prochlorperazine (COMPAZINE) 10 MG tablet Take 1 tablet (10 mg total) by mouth every 6 (six) hours as needed. 30 tablet 2  ? ?Facility-Administered Medications Prior to Visit  ?Medication Dose Route Frequency Provider Last Rate Last Admin  ? 0.9 %  sodium chloride infusion   Intravenous Continuous Curt Bears, MD   Stopped at 03/05/20 1410  ? ? ?Review of Systems ? ?Review of Systems  ?Constitutional: Negative.   ?HENT: Negative.    ?Respiratory:  Positive for shortness of breath.   ? ? ?Physical Exam ? ?BP 128/76 (BP Location: Left Arm, Patient Position: Sitting, Cuff Size: Normal)   Pulse (!) 115   Temp 98.6 ?F  (37 ?C) (Oral)   Ht 5\' 9"  (1.753 m)   Wt 185 lb 6.4 oz (84.1 kg)   SpO2 99%   BMI 27.38 kg/m?  ?Physical Exam ?Constitutional:   ?   General: He is not in acute distress. ?   Appearance: Normal appearance. He is

## 2021-08-02 ENCOUNTER — Inpatient Hospital Stay: Payer: BC Managed Care – PPO | Attending: Internal Medicine

## 2021-08-02 DIAGNOSIS — C7931 Secondary malignant neoplasm of brain: Secondary | ICD-10-CM | POA: Insufficient documentation

## 2021-08-02 DIAGNOSIS — C3481 Malignant neoplasm of overlapping sites of right bronchus and lung: Secondary | ICD-10-CM | POA: Insufficient documentation

## 2021-08-02 DIAGNOSIS — Z5111 Encounter for antineoplastic chemotherapy: Secondary | ICD-10-CM | POA: Insufficient documentation

## 2021-08-02 DIAGNOSIS — C7901 Secondary malignant neoplasm of right kidney and renal pelvis: Secondary | ICD-10-CM | POA: Insufficient documentation

## 2021-08-02 DIAGNOSIS — Z5112 Encounter for antineoplastic immunotherapy: Secondary | ICD-10-CM | POA: Insufficient documentation

## 2021-08-02 DIAGNOSIS — Z79899 Other long term (current) drug therapy: Secondary | ICD-10-CM | POA: Insufficient documentation

## 2021-08-05 ENCOUNTER — Inpatient Hospital Stay: Payer: BC Managed Care – PPO

## 2021-08-05 ENCOUNTER — Other Ambulatory Visit: Payer: Self-pay

## 2021-08-05 ENCOUNTER — Other Ambulatory Visit: Payer: Self-pay | Admitting: Physician Assistant

## 2021-08-05 ENCOUNTER — Encounter: Payer: Self-pay | Admitting: *Deleted

## 2021-08-05 ENCOUNTER — Encounter: Payer: Self-pay | Admitting: Internal Medicine

## 2021-08-05 ENCOUNTER — Other Ambulatory Visit: Payer: BC Managed Care – PPO

## 2021-08-05 ENCOUNTER — Inpatient Hospital Stay (HOSPITAL_BASED_OUTPATIENT_CLINIC_OR_DEPARTMENT_OTHER): Payer: BC Managed Care – PPO | Admitting: Internal Medicine

## 2021-08-05 VITALS — HR 99

## 2021-08-05 VITALS — BP 128/74 | HR 109 | Temp 98.5°F | Resp 20 | Ht 69.0 in | Wt 186.5 lb

## 2021-08-05 DIAGNOSIS — Z5111 Encounter for antineoplastic chemotherapy: Secondary | ICD-10-CM | POA: Diagnosis not present

## 2021-08-05 DIAGNOSIS — C3491 Malignant neoplasm of unspecified part of right bronchus or lung: Secondary | ICD-10-CM

## 2021-08-05 DIAGNOSIS — Z95828 Presence of other vascular implants and grafts: Secondary | ICD-10-CM

## 2021-08-05 DIAGNOSIS — C3490 Malignant neoplasm of unspecified part of unspecified bronchus or lung: Secondary | ICD-10-CM | POA: Diagnosis not present

## 2021-08-05 DIAGNOSIS — Z5112 Encounter for antineoplastic immunotherapy: Secondary | ICD-10-CM

## 2021-08-05 DIAGNOSIS — Z79899 Other long term (current) drug therapy: Secondary | ICD-10-CM | POA: Diagnosis not present

## 2021-08-05 DIAGNOSIS — C3481 Malignant neoplasm of overlapping sites of right bronchus and lung: Secondary | ICD-10-CM | POA: Diagnosis not present

## 2021-08-05 DIAGNOSIS — E860 Dehydration: Secondary | ICD-10-CM

## 2021-08-05 DIAGNOSIS — E876 Hypokalemia: Secondary | ICD-10-CM

## 2021-08-05 DIAGNOSIS — D649 Anemia, unspecified: Secondary | ICD-10-CM

## 2021-08-05 DIAGNOSIS — C7901 Secondary malignant neoplasm of right kidney and renal pelvis: Secondary | ICD-10-CM | POA: Diagnosis not present

## 2021-08-05 DIAGNOSIS — C7931 Secondary malignant neoplasm of brain: Secondary | ICD-10-CM | POA: Diagnosis not present

## 2021-08-05 LAB — CBC WITH DIFFERENTIAL (CANCER CENTER ONLY)
Abs Immature Granulocytes: 0.03 10*3/uL (ref 0.00–0.07)
Basophils Absolute: 0 10*3/uL (ref 0.0–0.1)
Basophils Relative: 0 %
Eosinophils Absolute: 0 10*3/uL (ref 0.0–0.5)
Eosinophils Relative: 0 %
HCT: 28.4 % — ABNORMAL LOW (ref 39.0–52.0)
Hemoglobin: 9.5 g/dL — ABNORMAL LOW (ref 13.0–17.0)
Immature Granulocytes: 0 %
Lymphocytes Relative: 5 %
Lymphs Abs: 0.5 10*3/uL — ABNORMAL LOW (ref 0.7–4.0)
MCH: 34.1 pg — ABNORMAL HIGH (ref 26.0–34.0)
MCHC: 33.5 g/dL (ref 30.0–36.0)
MCV: 101.8 fL — ABNORMAL HIGH (ref 80.0–100.0)
Monocytes Absolute: 1.1 10*3/uL — ABNORMAL HIGH (ref 0.1–1.0)
Monocytes Relative: 12 %
Neutro Abs: 7.3 10*3/uL (ref 1.7–7.7)
Neutrophils Relative %: 83 %
Platelet Count: 365 10*3/uL (ref 150–400)
RBC: 2.79 MIL/uL — ABNORMAL LOW (ref 4.22–5.81)
RDW: 18 % — ABNORMAL HIGH (ref 11.5–15.5)
WBC Count: 8.9 10*3/uL (ref 4.0–10.5)
nRBC: 0 % (ref 0.0–0.2)

## 2021-08-05 LAB — CMP (CANCER CENTER ONLY)
ALT: 8 U/L (ref 0–44)
AST: 19 U/L (ref 15–41)
Albumin: 2.9 g/dL — ABNORMAL LOW (ref 3.5–5.0)
Alkaline Phosphatase: 113 U/L (ref 38–126)
Anion gap: 9 (ref 5–15)
BUN: 9 mg/dL (ref 6–20)
CO2: 25 mmol/L (ref 22–32)
Calcium: 8.9 mg/dL (ref 8.9–10.3)
Chloride: 105 mmol/L (ref 98–111)
Creatinine: 0.96 mg/dL (ref 0.61–1.24)
GFR, Estimated: >60 mL/min (ref >60)
Glucose, Bld: 85 mg/dL (ref 70–99)
Potassium: 3.1 mmol/L — ABNORMAL LOW (ref 3.5–5.1)
Sodium: 139 mmol/L (ref 135–145)
Total Bilirubin: 0.5 mg/dL (ref 0.3–1.2)
Total Protein: 6.3 g/dL — ABNORMAL LOW (ref 6.5–8.1)

## 2021-08-05 LAB — TSH: TSH: 2.013 u[IU]/mL (ref 0.320–4.118)

## 2021-08-05 MED ORDER — HEPARIN SOD (PORK) LOCK FLUSH 100 UNIT/ML IV SOLN
500.0000 [IU] | Freq: Once | INTRAVENOUS | Status: AC | PRN
  Administered 2021-08-05: 500 [IU]

## 2021-08-05 MED ORDER — PROCHLORPERAZINE MALEATE 10 MG PO TABS
10.0000 mg | ORAL_TABLET | Freq: Once | ORAL | Status: AC
  Administered 2021-08-05: 10 mg via ORAL
  Filled 2021-08-05: qty 1

## 2021-08-05 MED ORDER — SODIUM CHLORIDE 0.9 % IV SOLN
200.0000 mg | Freq: Once | INTRAVENOUS | Status: AC
  Administered 2021-08-05: 200 mg via INTRAVENOUS
  Filled 2021-08-05: qty 8

## 2021-08-05 MED ORDER — POTASSIUM CHLORIDE CRYS ER 20 MEQ PO TBCR: 20.0000 meq | EXTENDED_RELEASE_TABLET | Freq: Every day | ORAL | 0 refills | Status: AC

## 2021-08-05 MED ORDER — SODIUM CHLORIDE 0.9% FLUSH
10.0000 mL | INTRAVENOUS | Status: DC | PRN
  Administered 2021-08-05: 10 mL

## 2021-08-05 MED ORDER — OXYCODONE-ACETAMINOPHEN 5-325 MG PO TABS: 1.0000 | ORAL_TABLET | Freq: Four times a day (QID) | ORAL | 0 refills | Status: AC | PRN

## 2021-08-05 MED ORDER — SODIUM CHLORIDE 0.9 % IV SOLN
500.0000 mg/m2 | Freq: Once | INTRAVENOUS | Status: AC
  Administered 2021-08-05: 1000 mg via INTRAVENOUS
  Filled 2021-08-05: qty 40

## 2021-08-05 MED ORDER — ALTEPLASE 2 MG IJ SOLR
2.0000 mg | Freq: Once | INTRAMUSCULAR | Status: AC | PRN
  Administered 2021-08-05: 2 mg
  Filled 2021-08-05: qty 2

## 2021-08-05 MED ORDER — SODIUM CHLORIDE 0.9 % IV SOLN: Freq: Once | INTRAVENOUS | Status: AC

## 2021-08-05 MED ORDER — SODIUM CHLORIDE 0.9% FLUSH
10.0000 mL | INTRAVENOUS | Status: AC | PRN
  Administered 2021-08-05: 10 mL

## 2021-08-05 NOTE — Progress Notes (Signed)
Per Dr Julien Nordmann , it is ok to treat pt today with  ?Alimta and Keytruda  and pulse of 109. ?

## 2021-08-05 NOTE — Patient Instructions (Signed)
[facility]  Discharge Instructions: ?Thank you for choosing [facility] to provide your oncology and hematology care.  ? ?If you have a lab appointment with the [facility], please go directly to the [facility] and check in at the registration area. ?  ?Wear comfortable clothing and clothing appropriate for easy access to any Portacath or PICC line.  ? ?We strive to give you quality time with your provider. You may need to reschedule your appointment if you arrive late (15 or more minutes).  Arriving late affects you and other patients whose appointments are after yours.  Also, if you miss three or more appointments without notifying the office, you may be dismissed from the clinic at the provider?s discretion.    ?  ?For prescription refill requests, have your pharmacy contact our office and allow 72 hours for refills to be completed.   ? ?Today you received the following chemotherapy and/or immunotherapy agents: Alimta and Keytruda    ?  ?To help prevent nausea and vomiting after your treatment, we encourage you to take your nausea medication as directed. ? ?BELOW ARE SYMPTOMS THAT SHOULD BE REPORTED IMMEDIATELY: ?*FEVER GREATER THAN 100.4 F (38 ?C) OR HIGHER ?*CHILLS OR SWEATING ?*NAUSEA AND VOMITING THAT IS NOT CONTROLLED WITH YOUR NAUSEA MEDICATION ?*UNUSUAL SHORTNESS OF BREATH ?*UNUSUAL BRUISING OR BLEEDING ?*URINARY PROBLEMS (pain or burning when urinating, or frequent urination) ?*BOWEL PROBLEMS (unusual diarrhea, constipation, pain near the anus) ?TENDERNESS IN MOUTH AND THROAT WITH OR WITHOUT PRESENCE OF ULCERS (sore throat, sores in mouth, or a toothache) ?UNUSUAL RASH, SWELLING OR PAIN  ?UNUSUAL VAGINAL DISCHARGE OR ITCHING  ? ?Items with * indicate a potential emergency and should be followed up as soon as possible or go to the Emergency Department if any problems should occur. ? ?Please show the CHEMOTHERAPY ALERT CARD or IMMUNOTHERAPY ALERT CARD at  check-in to the Emergency Department and triage nurse. ? ?Should you have questions after your visit or need to cancel or reschedule your appointment, please contact [facility]  Dept: [phone]  and follow the prompts.  Office hours are 8:00 a.m. to 4:30 p.m. Monday - Friday. Please note that voicemails left after 4:00 p.m. may not be returned until the following business day.  We are closed weekends and major holidays. You have access to a nurse at all times for urgent questions. Please call the main number to the clinic Dept: [phone] and follow the prompts. ? ? ?For any non-urgent questions, you may also contact your provider using MyChart. We now offer e-Visits for anyone 47 and older to request care online for non-urgent symptoms. For details visit [email or web address]. ?  ?Also download the MyChart app! Go to the app store, search "MyChart", open the app, select [facility], and log in with your MyChart username and password. ? ?Due to Covid, a mask is required upon entering the hospital/clinic. If you do not have a mask, one will be given to you upon arrival. For doctor visits, patients may have 1 support person aged 50 or older with them. For treatment visits, patients cannot have anyone with them due to current Covid guidelines and our immunocompromised population.  ? ?

## 2021-08-05 NOTE — Progress Notes (Signed)
Dr. Julien Nordmann aware- Ok to proceed with treatment with elevated heart rate. At rest went as low as 99, but average 103-105. ? ?Patient was given paperwork and it was reinforced that he needed to pick up his potassium at the pharmacy (and pain medications were refilled). ?

## 2021-08-05 NOTE — Progress Notes (Signed)
?    [facility] ?Telephone:([phone]) [phone]   [phone] ? ?OFFICE PROGRESS NOTE ? ?[name], DO ?[address] ?[place] [address] ? ?DIAGNOSIS: Metastatic non-small cell lung cancer initially diagnosed as stage IIB (T3, N0, M0) non-small cell lung cancer, adenocarcinoma presented with large central perihilar mass with suspicious groundglass opacity in the right middle lobe and left upper lobe diagnosed in September 2021.  The patient had evidence of metastatic disease to the right kidney in March 2022. ? ?Molecular studies by guardant 360: No actionable mutations. ?  ?PDl1: 80% ?  ?PRIOR THERAPY: Concurrent chemoradiation with weekly carboplatin for AUC of 2 and paclitaxel 45 mg/M2.  First dose February 10, 2020.  Status post 5 cycles. ?  ?CURRENT THERAPY:  ?1) Palliative systemic chemotherapy with carboplatin AUC of 5, Alimta 500 mg/M2, Keytruda 200 mg IV every 3 weeks.  First dose expected on 09/24/2020. Status post 15 cycles.  Starting from cycle #5 the patient is on maintenance treatment with Alimta and Keytruda every 3 weeks. ?2) Palliative radiation to the left anterior rib cage area as well as the mid back area under the care of Dr. Sondra Come. Last treatment on 10/05/20. ? ?INTERVAL HISTORY: ?Richard Li 61 y.o. male returns to the clinic today for follow-up visit.  The patient continues to complain of increasing fatigue and weakness as well as shortness of breath at baseline worse with exertion and cough that has been getting worse recently.  He also has intermittent constipation.  He has some nausea and dry heaves few days after his treatment.  He denied having any current fever or chills.  He has no headache or visual changes.  He has no significant weight loss or night sweats.  He is here today for evaluation before starting cycle #16 of his treatment. ?  ?MEDICAL HISTORY: ?Past Medical History:  ?Diagnosis Date  ? Allergic rhinitis, cause unspecified    ? Anxiety state, unspecified   ? Asthma   ? as a child  ? Chronic airway obstruction, not elsewhere classified   ? Esophageal reflux   ? History of kidney stones   ? History of radiation therapy 02/13/2020-03/24/2020  ? right lung        Dr Gery Pray  ? Hypertension   ? Lumbago   ? lung ca dx'd 12/2019  ? Other and unspecified hyperlipidemia   ? Other chest pain   ? ? ?ALLERGIES:  is allergic to penicillins and amoxicillin. ? ?MEDICATIONS:  ?Current Outpatient Medications  ?Medication Sig Dispense Refill  ? acetaminophen (TYLENOL) 500 MG tablet Take 1,000 mg by mouth every 8 (eight) hours as needed for moderate pain or mild pain.    ? dextromethorphan-guaiFENesin (MUCINEX DM) 30-600 MG 12hr tablet Take 1 tablet by mouth 2 (two) times daily.    ? doxycycline (VIBRA-TABS) 100 MG tablet Take 1 tablet by mouth twice daily 60 tablet 3  ? feeding supplement (ENSURE ENLIVE / ENSURE PLUS) LIQD Take 237 mLs by mouth 3 (three) times daily between meals. (Patient taking differently: Take 237 mLs by mouth daily.) 237 mL 12  ? fenofibrate 160 MG tablet Take 1 tablet by mouth once daily 90 tablet 0  ? ferrous sulfate 325 (65 FE) MG tablet Take 1 tablet (325 mg total) by mouth daily with breakfast. 30 tablet 0  ? folic acid (FOLVITE) 1 MG tablet Take 1 tablet by mouth once daily 30 tablet 0  ? Ipratropium-Albuterol (COMBIVENT RESPIMAT)  20-100 MCG/ACT AERS respimat INHALE 1 TO 2 PUFFS BY MOUTH EVERY 6 HOURS AS NEEDED FOR WHEEZING 4 g 5  ? lactulose (CHRONULAC) 10 GM/15ML solution TAKE 15 MLS BY MOUTH 3 TIMES DAILY Strength: 10 GM/15ML 236 mL 0  ? lidocaine-prilocaine (EMLA) cream Apply 1 application topically as needed. 30 g 2  ? LORazepam (ATIVAN) 1 MG tablet Take 1 tablet (1 mg total) by mouth every 8 (eight) hours as needed for anxiety. 90 tablet 5  ? Multiple Vitamin (MULTIVITAMIN WITH MINERALS) TABS tablet Take 1 tablet by mouth daily.    ? ondansetron (ZOFRAN) 8 MG tablet Take 1 tablet (8 mg total) by mouth every 8 (eight)  hours as needed for nausea or vomiting. Starting day 3 after chemotherapy 30 tablet 2  ? oxyCODONE-acetaminophen (PERCOCET/ROXICET) 5-325 MG tablet Take 1 tablet by mouth every 6 (six) hours as needed for severe pain. 30 tablet 0  ? potassium chloride SA (KLOR-CON M) 20 MEQ tablet Take 1 tablet (20 mEq total) by mouth daily. 5 tablet 0  ? predniSONE (DELTASONE) 10 MG tablet Take 1 tablet (10 mg total) by mouth daily with breakfast. 90 tablet 3  ? prochlorperazine (COMPAZINE) 10 MG tablet Take 1 tablet (10 mg total) by mouth every 6 (six) hours as needed. 30 tablet 2  ? ?No current facility-administered medications for this visit.  ? ?Facility-Administered Medications Ordered in Other Visits  ?Medication Dose Route Frequency Provider Last Rate Last Admin  ? 0.9 %  sodium chloride infusion   Intravenous Continuous Curt Bears, MD   Stopped at 03/05/20 1410  ? ? ?SURGICAL HISTORY:  ?Past Surgical History:  ?Procedure Laterality Date  ? APPENDECTOMY    ? BRONCHIAL BIOPSY  01/07/2020  ? Procedure: BRONCHIAL BIOPSIES;  Surgeon: Collene Gobble, MD;  Location: [facility] ENDOSCOPY;  Service: Pulmonary;;  ? BRONCHIAL BIOPSY  04/05/2020  ? Procedure: BRONCHIAL BIOPSIES;  Surgeon: Rigoberto Noel, MD;  Location: [facility];  Service: Cardiopulmonary;;  ? BRONCHIAL BRUSHINGS  01/07/2020  ? Procedure: BRONCHIAL BRUSHINGS;  Surgeon: Collene Gobble, MD;  Location: [facility] ENDOSCOPY;  Service: Pulmonary;;  ? BRONCHIAL NEEDLE ASPIRATION BIOPSY  01/07/2020  ? Procedure: BRONCHIAL NEEDLE ASPIRATION BIOPSIES;  Surgeon: Collene Gobble, MD;  Location: [facility] ENDOSCOPY;  Service: Pulmonary;;  ? BRONCHIAL WASHINGS  01/07/2020  ? Procedure: BRONCHIAL WASHINGS;  Surgeon: Collene Gobble, MD;  Location: [facility] ENDOSCOPY;  Service: Pulmonary;;  ? BRONCHIAL WASHINGS  04/05/2020  ? Procedure: BRONCHIAL WASHINGS;  Surgeon: Rigoberto Noel, MD;  Location: [facility] ENDOSCOPY;  Service: Cardiopulmonary;;  ? COLONOSCOPY  15 years ago  ? HEMOSTASIS CONTROL  01/07/2020  ?  Procedure: HEMOSTASIS CONTROL;  Surgeon: Collene Gobble, MD;  Location: [facility] ENDOSCOPY;  Service: Pulmonary;;  cold saline  ? IR IMAGING GUIDED PORT INSERTION  09/18/2020  ? NASAL TURBINATE REDUCTION  2002  ? Dr.Crossley  ? THORACENTESIS N/A 05/28/2021  ? Procedure: THORACENTESIS;  Surgeon: Lanier Clam, MD;  Location: [facility] ENDOSCOPY;  Service: Pulmonary;  Laterality: N/A;  ? VASECTOMY    ? VIDEO BRONCHOSCOPY Right 04/05/2020  ? Procedure: VIDEO BRONCHOSCOPY WITH FLUORO;  Surgeon: Rigoberto Noel, MD;  Location: [facility];  Service: Cardiopulmonary;  Laterality: Right;  ? VIDEO BRONCHOSCOPY WITH ENDOBRONCHIAL NAVIGATION N/A 01/07/2020  ? Procedure: VIDEO BRONCHOSCOPY WITH ENDOBRONCHIAL NAVIGATION;  Surgeon: Collene Gobble, MD;  Location: [facility] ENDOSCOPY;  Service: Pulmonary;  Laterality: N/A;  ? VIDEO BRONCHOSCOPY WITH ENDOBRONCHIAL ULTRASOUND N/A 01/07/2020  ? Procedure: VIDEO BRONCHOSCOPY WITH ENDOBRONCHIAL  ULTRASOUND;  Surgeon: Collene Gobble, MD;  Location: [facility] ENDOSCOPY;  Service: Pulmonary;  Laterality: N/A;  ? ? ?REVIEW OF SYSTEMS:  Constitutional: positive for fatigue ?Eyes: negative ?Ears, nose, mouth, throat, and face: negative ?Respiratory: positive for cough, dyspnea on exertion, and pleurisy/chest pain ?Cardiovascular: negative ?Gastrointestinal: positive for constipation and nausea ?Genitourinary:negative ?Integument/breast: negative ?Hematologic/lymphatic: negative ?Musculoskeletal:negative ?Neurological: negative ?Behavioral/Psych: negative ?Endocrine: negative ?Allergic/Immunologic: negative  ? ?PHYSICAL EXAMINATION: General appearance: alert, cooperative, fatigued, and no distress ?Head: Normocephalic, without obvious abnormality, atraumatic ?Neck: no adenopathy, no JVD, supple, symmetrical, trachea midline, and thyroid not enlarged, symmetric, no tenderness/mass/nodules ?Lymph nodes: Cervical, supraclavicular, and axillary nodes normal. ?Resp: diminished breath sounds LLL and dullness to  percussion LLL ?Back: symmetric, no curvature. ROM normal. No CVA tenderness. ?Cardio: regular rate and rhythm, S1, S2 normal, no murmur, click, rub or gallop ?GI: soft, non-tender; bowel sounds normal; no masses

## 2021-08-05 NOTE — Progress Notes (Signed)
Oncology Nurse Navigator Documentation ? ? ?  08/05/2021  ? 10:00 AM 01/30/2020  ?  3:00 PM  ?Oncology Nurse Navigator Flowsheets  ?Abnormal Finding Date  12/20/2019  ?Confirmed Diagnosis Date  01/07/2020  ?Diagnosis Status  Confirmed Diagnosis Complete  ?Planned Course of Treatment  Chemo/Radiation Concurrent  ?Phase of Treatment  Radiation  ?Navigator Follow Up Date:  02/03/2020  ?Navigator Follow Up Reason:  Appointment Review  ?Navigator Location [facility]  ?Navigator Encounter Type Follow-up Appt Clinic/MDC  ?Multidisiplinary Clinic Date  01/30/2020  ?Multidisiplinary Clinic Type  Thoracic  ?Patient Visit Type MedOnc;Follow-up MedOnc  ?Treatment Phase Treatment Pre-Tx/Tx Discussion  ?Barriers/Navigation Needs Education/spoke with Mr. Greathouse today.  He is not feeling great and Dr. Julien Nordmann addressed his concerns.  Patient's K+ is low.  I added information on K+ rich foods to his AVS and verbally educated.  Coordination of Care;Education;Work Conflicts  ?Education Other Newly Diagnosed Cancer Education;Other  ?Interventions Education;Psycho-Social Support Coordination of Care;Disability/FMLA;Education;Psycho-Social Support  ?Acuity Level 2-Minimal Needs (1-2 Barriers Identified) Level 3-Moderate Needs (3-4 Barriers Identified)  ?Coordination of Care  Other  ?Education Method Verbal;Other Verbal;Written  ?Time Spent with Patient 15 45  ?  ?

## 2021-08-05 NOTE — Patient Instructions (Signed)
Potassium Content of Foods ?Potassium is a mineral found in many foods and drinks. It affects how the heart works, and helps keep fluids and minerals balanced in the body. ?The amount of potassium you need each day depends on your age and any medical conditions you may have. Talk to your health care provider or dietitian about how much potassium you need. ?The following lists of foods provide the general serving size for foods and the approximate amount of potassium in each serving, listed in milligrams (mg). Actual values may vary depending on the product and how it is processed. ?High in potassium ?The following foods and beverages have 200 mg or more of potassium per serving: ?Apricots (raw) - 2 have 200 mg of potassium. ?Apricots (dry) - 5 have 200 mg of potassium. ?Artichoke - 1 medium has 345 mg of potassium. ?Avocado - ? fruit has 245 mg of potassium. ?Banana - 1 medium fruit has 425 mg of potassium. ?[place] or baked beans (canned) - ? cup has 280 mg of potassium. ?White beans (canned) - ? cup has 595 mg potassium. ?Beef roast - 3 oz has 320 mg of potassium. ?Ground beef - 3 oz has 270 mg of potassium. ?Beets (raw or cooked) - ? cup has 260 mg of potassium. ?Bran muffin - 2 oz has 300 mg of potassium. ?Broccoli (cooked) - ? cup has 230 mg of potassium. ?Brussels sprouts - ? cup has 250 mg of potassium. ?Cantaloupe - ? cup has 215 mg of potassium. ?Cereal, 100% bran - ? cup has 200-400 mg of potassium. ?Cheeseburger -1 single fast food burger has 225-400 mg of potassium. ?Chicken - 3 oz has 220 mg of potassium. ?Clams (canned) - 3 oz has 535 mg of potassium. ?Crab - 3 oz has 225 mg of potassium. ?Dates - 5 have 270 mg of potassium. ?Dried beans and peas - ? cup has 300-475 mg of potassium. ?Figs (dried) - 2 have 260 mg of potassium. ?Fish (halibut, tuna, cod, snapper) - 3 oz has 480 mg of potassium. ?Fish (salmon, haddock, swordfish, perch) - 3 oz has 300 mg of potassium. ?Fish (tuna, canned) - 3 oz has 200 mg  of potassium. ?[place] fries (fast food) - 3 oz has 470 mg of potassium. ?[place]an[place] with fruit and nuts - ? cup has 200 mg of potassium. ?Grapefruit juice - ? cup has 200 mg of potassium. ?Honeydew melon - ? cup has 200 mg of potassium. ?Kale (raw) - 1 cup has 300 mg of potassium. ?Kiwi - 1 medium fruit has 240 mg of potassium. ?Kohlrabi, rutabaga, parsnips - ? cup has 280 mg of potassium. ?Lentils - ? cup has 365 mg of potassium. ?Mango - 1 each has 325 mg of potassium. ?Milk (nonfat, low-fat, whole, buttermilk) - 1 cup has 350-380 mg of potassium. ?Milk (chocolate) - 1 cup has 420 mg of potassium ?Molasses - 1 Tbsp has 295 mg of potassium. ?Mushrooms - ? cup has 280 mg of potassium. ?Nectarine - 1 each has 275 mg of potassium. ?Nuts (almonds, peanuts, hazelnuts, [place], cashew, mixed) - 1 oz has 200 mg of potassium. ?Nuts (pistachios) - 1 oz has 295 mg of potassium. ?Orange - 1 fruit has 240 mg of potassium. ?Orange juice - ? cup has 235 mg of potassium. ?Papaya - ? medium fruit has 390 mg of potassium. ?Peanut butter (chunky) - 2 Tbsp has 240 mg of potassium. ?Peanut butter (smooth) - 2 Tbsp has 210 mg of potassium. ?Pear - 1 medium (200  mg) of potassium. ?Pomegranate - 1 whole fruit has 400 mg of potassium. ?Pomegranate juice - ? cup has 215 mg of potassium. ?Pork - 3 oz has 350 mg of potassium. ?Potato chips (salted) - 1 oz has 465 mg of potassium. ?Potato (baked with skin) - 1 medium has 925 mg of potassium. ?Potato (boiled) - ? cup has 255 mg of potassium. ?Potato (Mashed) - ? cup has 330 mg of potassium. ?Prune juice - ? cup has 370 mg of potassium. ?Prunes - 5 have 305 mg of potassium. ?Pudding (chocolate) - ? cup has 230 mg of potassium. ?Pumpkin (canned) - ? cup has 250 mg of potassium. ?Raisins (seedless) - ? cup has 270 mg of potassium. ?Seeds (sunflower or pumpkin) - 1 oz has 240 mg of potassium. ?Soy milk - 1 cup has 300 mg of potassium. ?Spinach (cooked) - 1/2 cup has 420 mg of potassium. ?Spinach  (canned) - ? cup has 370 mg of potassium. ?Sweet potato (baked with skin) - 1 medium has 450 mg of potassium. ?Swiss chard - ? cup has 480 mg of potassium. ?Tomato or vegetable juice - ? cup has 275 mg of potassium. ?Tomato (sauce or puree) - ? cup has 400-550 mg of potassium. ?Tomato (raw) - 1 medium has 290 mg of potassium. ?Tomato (canned) - ? cup has 200-300 mg of potassium. ?[place] - 3 oz has 250 mg of potassium. ?Wheat germ - 1 oz has 250 mg of potassium. ?Winter squash - ? cup has 250 mg of potassium. ?Yogurt (plain or fruited) - 6 oz has 260-435 mg of potassium. ?Zucchini - ? cup has 220 mg of potassium. ?Moderate in potassium ?The following foods and beverages have 50-200 mg of potassium per serving: ?Apple - 1 fruit has 150 mg of potassium ?Apple juice - ? cup has 150 mg of potassium ?Applesauce - ? cup has 90 mg of potassium ?Apricot nectar - ? cup has 140 mg of potassium ?Asparagus (small spears) - ? cup has 155 mg of potassium ?Asparagus (large spears) - 6 have 155 mg of potassium ?Bagel (cinnamon raisin) - 1 four-inch bagel has 130 mg of potassium ?Bagel (egg or plain) - 1 four- inch bagel has 70 mg of potassium ?Beans (green) - ? cup has 90 mg of potassium ?Beans (yellow) - ? cup has 190 mg of potassium ?Beer, regular - 12 oz has 100 mg of potassium ?Beets (canned) - ? cup has 125 mg of potassium ?Blackberries - ? cup has 115 mg of potassium ?Blueberries - ? cup has 60 mg of potassium ?Bread (whole wheat) - 1 slice has 70 mg of potassium ?Broccoli (raw) - ? cup has 145 mg of potassium ?Cabbage - ? cup has 150 mg of potassium ?Carrots (cooked or raw) - ? cup has 180 mg of potassium ?Cauliflower (raw) - ? cup has 150 mg of potassium ?Celery (raw) - ? cup has 155 mg of potassium ?Cereal, bran flakes - ? cup has 120-150 mg of potassium ?Cheese (cottage) - ? cup has 110 mg of potassium ?Cherries - 10 have 150 mg of potassium ?Chocolate - 1? oz bar has 165 mg of potassium ?Coffee (brewed) - 6 oz has 90 mg  of potassium ?Corn - ? cup or 1 ear has 195 mg of potassium ?Cucumbers - ? cup has 80 mg of potassium ?Egg - 1 large egg has 60 mg of potassium ?Eggplant - ? cup has 60 mg of potassium ?Endive (raw) - ? cup has 80  mg of potassium ?English muffin - 1 has 65 mg of potassium ?Fish (ocean perch) - 3 oz has 192 mg of potassium ?Frankfurter, beef or pork - 1 has 75 mg of potassium ?Fruit cocktail - ? cup has 115 mg of potassium ?Grape juice - ? cup has 170 mg of potassium ?Grapefruit - ? fruit has 175 mg of potassium ?Grapes - ? cup has 155 mg of potassium ?Greens: kale, turnip, collard - ? cup has 110-150 mg of potassium ?Ice cream or frozen yogurt (chocolate) - ? cup has 175 mg of potassium ?Ice cream or frozen yogurt (vanilla) - ? cup has 120-150 mg of potassium ?Lemons, limes - 1 each has 80 mg of potassium ?Lettuce - 1 cup has 100 mg of potassium ?Mixed vegetables - ? cup has 150 mg of potassium ?Mushrooms, raw - ? cup has 110 mg of potassium ?Nuts (walnuts, pecans, or macadamia) - 1 oz has 125 mg of potassium ?Oatmeal - ? cup has 80 mg of potassium ?Okra - ? cup has 110 mg of potassium ?Onions - ? cup has 120 mg of potassium ?Peach - 1 has 185 mg of potassium ?Peaches (canned) - ? cup has 120 mg of potassium ?Pears (canned) - ? cup has 120 mg of potassium ?Peas, green (frozen) - ? cup has 90 mg of potassium ?Peppers (Green) - ? cup has 130 mg of potassium ?Peppers (Red) - ? cup has 160 mg of potassium ?Pineapple juice - ? cup has 165 mg of potassium ?Pineapple (fresh or canned) - ? cup has 100 mg of potassium ?Plums - 1 has 105 mg of potassium ?Pudding, vanilla - ? cup has 150 mg of potassium ?Raspberries - ? cup has 90 mg of potassium ?Rhubarb - ? cup has 115 mg of potassium ?Rice, wild - ? cup has 80 mg of potassium ?Shrimp - 3 oz has 155 mg of potassium ?Spinach (raw) - 1 cup has 170 mg of potassium ?Strawberries - ? cup has 125 mg of potassium ?Summer squash - ? cup has 175-200 mg of potassium ?Swiss chard (raw)  - 1 cup has 135 mg of potassium ?Tangerines - 1 fruit has 140 mg of potassium ?Tea, brewed - 6 oz has 65 mg of potassium ?Turnips - ? cup has 140 mg of potassium ?Watermelon - ? cup has 85 mg of potassium ?

## 2021-08-09 ENCOUNTER — Ambulatory Visit (HOSPITAL_COMMUNITY)
Admission: RE | Admit: 2021-08-09 | Discharge: 2021-08-09 | Disposition: A | Discharge status: Home / Self Care | Payer: BC Managed Care – PPO | Source: Ambulatory Visit | Attending: Internal Medicine | Admitting: Internal Medicine

## 2021-08-09 DIAGNOSIS — J90 Pleural effusion, not elsewhere classified: Secondary | ICD-10-CM | POA: Insufficient documentation

## 2021-08-09 DIAGNOSIS — J156 Pneumonia due to other aerobic Gram-negative bacteria: Secondary | ICD-10-CM | POA: Diagnosis not present

## 2021-08-09 DIAGNOSIS — C3491 Malignant neoplasm of unspecified part of right bronchus or lung: Secondary | ICD-10-CM

## 2021-08-09 DIAGNOSIS — R0602 Shortness of breath: Secondary | ICD-10-CM | POA: Diagnosis not present

## 2021-08-09 IMAGING — US US THORACENTESIS ASP PLEURAL SPACE W/IMG GUIDE
1 series · 2 of 2 positions shown · non-contrast
Comparison: Radiograph [DATE], and previous

CLINICAL DATA: History of left pleural effusion and shortness of
breath

EXAM:
CHEST ULTRASOUND

[Series 1: us thoracentesis asp pleural s mc & wl · 2 of 2 slices shown]
[im 1/2]
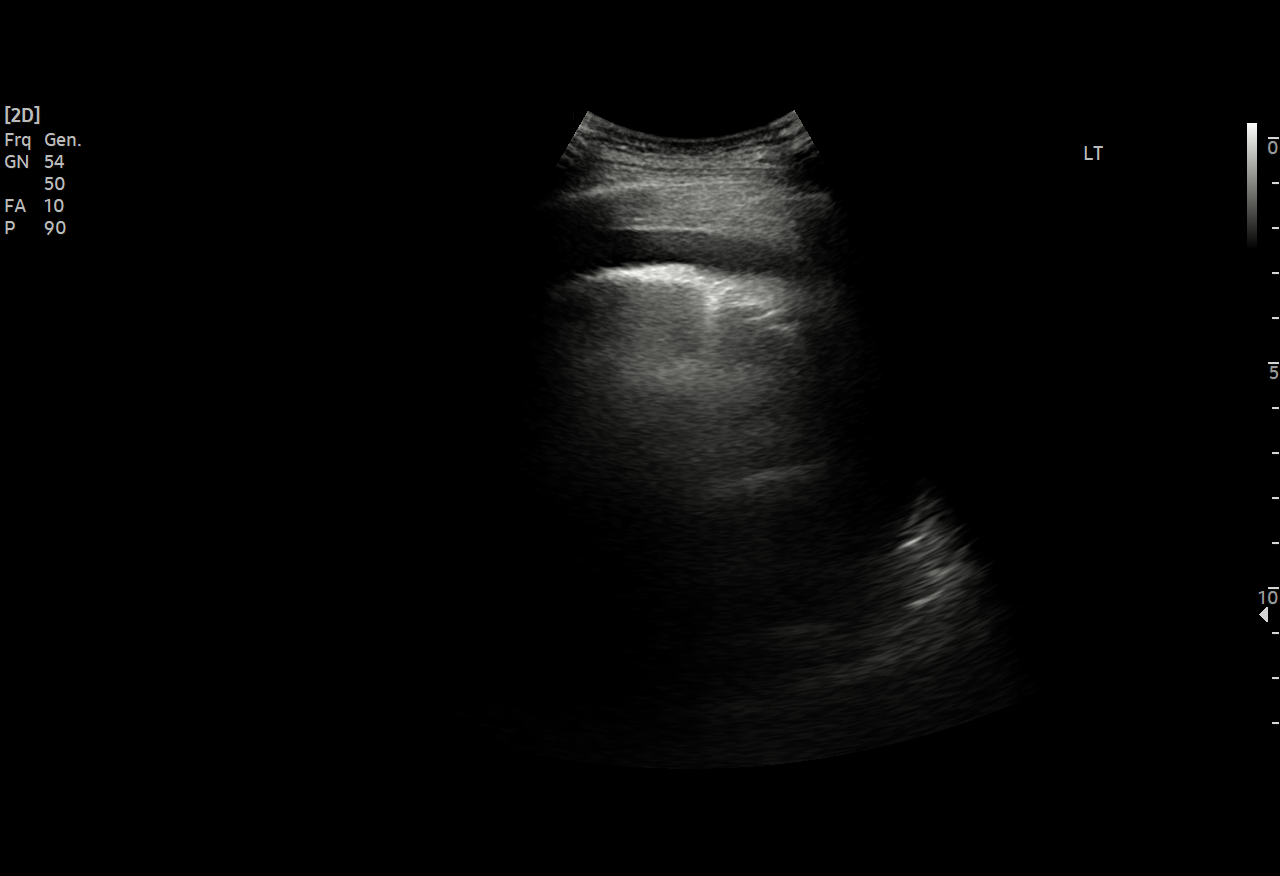
[im 2/2]
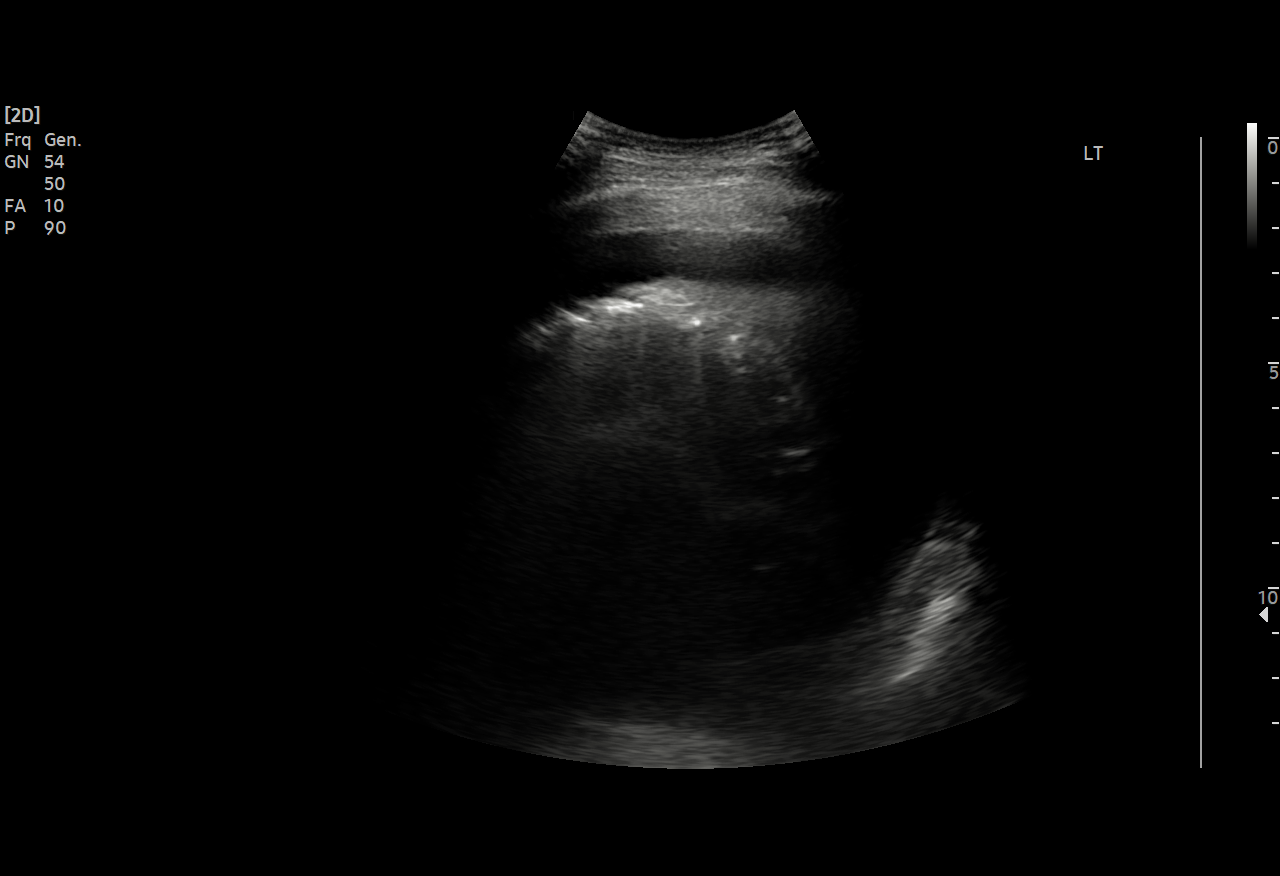

[2 of 2 positions shown; findings below may reference images not displayed]

FINDINGS: Small volume left pleural effusion. Patient opted to defer
thoracentesis after discussion with APP.
IMPRESSION: Small left pleural effusion.  Thoracentesis deferred.

## 2021-08-09 MED ORDER — LIDOCAINE HCL 1 % IJ SOLN
INTRAMUSCULAR | Status: AC
  Filled 2021-08-09: qty 20

## 2021-08-09 NOTE — Progress Notes (Signed)
Pt presented for OP thoracentesis in no apparent distress and BP 115/60.  Minimal amount of fluid around left lung. Scant on the right.  Discussed option to proceed or not with planned thora with knowledge of potential increased risks and no guarantee of improvement in symptoms.  Patient opted to hold off at this time.  ?Patient endorsed not feeling well, weak, and SOB, and wanting to get this figured out.  He voiced possibility of ED visit.  Offered to help him to ED while he is here.  He declined, saying he wanted to give himself the day to see if he could hydrate at home and re-evaluate how he feels tomorrow.  He was encouraged to re-evaluate throughout the day and come to ED or call EMS if needed.   ? ?Electronically Signed: ?Pasty Spillers, PA-C ?08/09/2021, 10:21 AM ? ? ?

## 2021-08-10 ENCOUNTER — Emergency Department (HOSPITAL_COMMUNITY): Payer: BC Managed Care – PPO

## 2021-08-10 ENCOUNTER — Encounter (HOSPITAL_COMMUNITY): Payer: Self-pay

## 2021-08-10 ENCOUNTER — Inpatient Hospital Stay (HOSPITAL_COMMUNITY)
Admission: EM | Admit: 2021-08-10 | Discharge: 2021-08-12 | DRG: 178 | Disposition: A | Discharge status: Home / Self Care | Payer: BC Managed Care – PPO | Attending: Internal Medicine | Admitting: Internal Medicine

## 2021-08-10 ENCOUNTER — Observation Stay (HOSPITAL_COMMUNITY): Payer: BC Managed Care – PPO

## 2021-08-10 ENCOUNTER — Other Ambulatory Visit: Payer: Self-pay

## 2021-08-10 DIAGNOSIS — E860 Dehydration: Secondary | ICD-10-CM | POA: Diagnosis present

## 2021-08-10 DIAGNOSIS — J90 Pleural effusion, not elsewhere classified: Secondary | ICD-10-CM | POA: Diagnosis not present

## 2021-08-10 DIAGNOSIS — E871 Hypo-osmolality and hyponatremia: Secondary | ICD-10-CM | POA: Diagnosis present

## 2021-08-10 DIAGNOSIS — R0902 Hypoxemia: Secondary | ICD-10-CM | POA: Diagnosis present

## 2021-08-10 DIAGNOSIS — K219 Gastro-esophageal reflux disease without esophagitis: Secondary | ICD-10-CM | POA: Diagnosis present

## 2021-08-10 DIAGNOSIS — Z6826 Body mass index (BMI) 26.0-26.9, adult: Secondary | ICD-10-CM

## 2021-08-10 DIAGNOSIS — Z8249 Family history of ischemic heart disease and other diseases of the circulatory system: Secondary | ICD-10-CM

## 2021-08-10 DIAGNOSIS — R1310 Dysphagia, unspecified: Secondary | ICD-10-CM | POA: Diagnosis present

## 2021-08-10 DIAGNOSIS — Z923 Personal history of irradiation: Secondary | ICD-10-CM

## 2021-08-10 DIAGNOSIS — R0602 Shortness of breath: Secondary | ICD-10-CM | POA: Diagnosis not present

## 2021-08-10 DIAGNOSIS — E663 Overweight: Secondary | ICD-10-CM | POA: Diagnosis present

## 2021-08-10 DIAGNOSIS — D630 Anemia in neoplastic disease: Secondary | ICD-10-CM | POA: Diagnosis present

## 2021-08-10 DIAGNOSIS — F411 Generalized anxiety disorder: Secondary | ICD-10-CM | POA: Diagnosis present

## 2021-08-10 DIAGNOSIS — D638 Anemia in other chronic diseases classified elsewhere: Secondary | ICD-10-CM

## 2021-08-10 DIAGNOSIS — C3491 Malignant neoplasm of unspecified part of right bronchus or lung: Secondary | ICD-10-CM | POA: Diagnosis present

## 2021-08-10 DIAGNOSIS — Z803 Family history of malignant neoplasm of breast: Secondary | ICD-10-CM

## 2021-08-10 DIAGNOSIS — R638 Other symptoms and signs concerning food and fluid intake: Secondary | ICD-10-CM

## 2021-08-10 DIAGNOSIS — Z66 Do not resuscitate: Secondary | ICD-10-CM | POA: Diagnosis present

## 2021-08-10 DIAGNOSIS — D72819 Decreased white blood cell count, unspecified: Secondary | ICD-10-CM | POA: Diagnosis present

## 2021-08-10 DIAGNOSIS — K5900 Constipation, unspecified: Secondary | ICD-10-CM | POA: Diagnosis present

## 2021-08-10 DIAGNOSIS — Z515 Encounter for palliative care: Secondary | ICD-10-CM

## 2021-08-10 DIAGNOSIS — G8929 Other chronic pain: Secondary | ICD-10-CM | POA: Diagnosis present

## 2021-08-10 DIAGNOSIS — T451X5A Adverse effect of antineoplastic and immunosuppressive drugs, initial encounter: Secondary | ICD-10-CM | POA: Diagnosis present

## 2021-08-10 DIAGNOSIS — D696 Thrombocytopenia, unspecified: Secondary | ICD-10-CM | POA: Diagnosis present

## 2021-08-10 DIAGNOSIS — E872 Acidosis, unspecified: Secondary | ICD-10-CM

## 2021-08-10 DIAGNOSIS — I10 Essential (primary) hypertension: Secondary | ICD-10-CM | POA: Diagnosis present

## 2021-08-10 DIAGNOSIS — Z79899 Other long term (current) drug therapy: Secondary | ICD-10-CM

## 2021-08-10 DIAGNOSIS — R110 Nausea: Secondary | ICD-10-CM

## 2021-08-10 DIAGNOSIS — J449 Chronic obstructive pulmonary disease, unspecified: Secondary | ICD-10-CM | POA: Diagnosis present

## 2021-08-10 DIAGNOSIS — J156 Pneumonia due to other aerobic Gram-negative bacteria: Principal | ICD-10-CM | POA: Diagnosis present

## 2021-08-10 DIAGNOSIS — J440 Chronic obstructive pulmonary disease with acute lower respiratory infection: Secondary | ICD-10-CM | POA: Diagnosis present

## 2021-08-10 DIAGNOSIS — Z7952 Long term (current) use of systemic steroids: Secondary | ICD-10-CM

## 2021-08-10 DIAGNOSIS — J441 Chronic obstructive pulmonary disease with (acute) exacerbation: Secondary | ICD-10-CM | POA: Diagnosis present

## 2021-08-10 DIAGNOSIS — Z87891 Personal history of nicotine dependence: Secondary | ICD-10-CM

## 2021-08-10 DIAGNOSIS — R531 Weakness: Secondary | ICD-10-CM

## 2021-08-10 DIAGNOSIS — Z825 Family history of asthma and other chronic lower respiratory diseases: Secondary | ICD-10-CM

## 2021-08-10 DIAGNOSIS — Z7951 Long term (current) use of inhaled steroids: Secondary | ICD-10-CM

## 2021-08-10 DIAGNOSIS — R000 Tachycardia, unspecified: Secondary | ICD-10-CM

## 2021-08-10 DIAGNOSIS — M109 Gout, unspecified: Secondary | ICD-10-CM | POA: Diagnosis present

## 2021-08-10 DIAGNOSIS — J910 Malignant pleural effusion: Secondary | ICD-10-CM | POA: Diagnosis present

## 2021-08-10 DIAGNOSIS — Z87442 Personal history of urinary calculi: Secondary | ICD-10-CM

## 2021-08-10 DIAGNOSIS — J189 Pneumonia, unspecified organism: Secondary | ICD-10-CM | POA: Diagnosis present

## 2021-08-10 DIAGNOSIS — C7931 Secondary malignant neoplasm of brain: Secondary | ICD-10-CM | POA: Diagnosis present

## 2021-08-10 DIAGNOSIS — Z880 Allergy status to penicillin: Secondary | ICD-10-CM

## 2021-08-10 DIAGNOSIS — Z7189 Other specified counseling: Secondary | ICD-10-CM

## 2021-08-10 DIAGNOSIS — E785 Hyperlipidemia, unspecified: Secondary | ICD-10-CM | POA: Diagnosis present

## 2021-08-10 LAB — BODY FLUID CELL COUNT WITH DIFFERENTIAL
Lymphs, Fluid: 42 %
Monocyte-Macrophage-Serous Fluid: 43 % — ABNORMAL LOW (ref 50–90)
Neutrophil Count, Fluid: 15 % (ref 0–25)
Total Nucleated Cell Count, Fluid: 25 cu mm (ref 0–1000)

## 2021-08-10 LAB — CBC WITH DIFFERENTIAL/PLATELET
Abs Immature Granulocytes: 0.06 10*3/uL (ref 0.00–0.07)
Basophils Absolute: 0 10*3/uL (ref 0.0–0.1)
Basophils Relative: 0 %
Eosinophils Absolute: 0 10*3/uL (ref 0.0–0.5)
Eosinophils Relative: 0 %
HCT: 29.5 % — ABNORMAL LOW (ref 39.0–52.0)
Hemoglobin: 9.9 g/dL — ABNORMAL LOW (ref 13.0–17.0)
Immature Granulocytes: 1 %
Lymphocytes Relative: 5 %
Lymphs Abs: 0.5 10*3/uL — ABNORMAL LOW (ref 0.7–4.0)
MCH: 34.5 pg — ABNORMAL HIGH (ref 26.0–34.0)
MCHC: 33.6 g/dL (ref 30.0–36.0)
MCV: 102.8 fL — ABNORMAL HIGH (ref 80.0–100.0)
Monocytes Absolute: 0.2 10*3/uL (ref 0.1–1.0)
Monocytes Relative: 2 %
Neutro Abs: 9.6 10*3/uL — ABNORMAL HIGH (ref 1.7–7.7)
Neutrophils Relative %: 92 %
Platelets: 272 10*3/uL (ref 150–400)
RBC: 2.87 MIL/uL — ABNORMAL LOW (ref 4.22–5.81)
RDW: 16.4 % — ABNORMAL HIGH (ref 11.5–15.5)
WBC: 10.4 10*3/uL (ref 4.0–10.5)
nRBC: 0 % (ref 0.0–0.2)

## 2021-08-10 LAB — COMPREHENSIVE METABOLIC PANEL
ALT: 12 U/L (ref 0–44)
AST: 30 U/L (ref 15–41)
Albumin: 2.5 g/dL — ABNORMAL LOW (ref 3.5–5.0)
Alkaline Phosphatase: 108 U/L (ref 38–126)
Anion gap: 9 (ref 5–15)
BUN: 13 mg/dL (ref 6–20)
CO2: 25 mmol/L (ref 22–32)
Calcium: 8.6 mg/dL — ABNORMAL LOW (ref 8.9–10.3)
Chloride: 102 mmol/L (ref 98–111)
Creatinine, Ser: 1 mg/dL (ref 0.61–1.24)
GFR, Estimated: >60 mL/min (ref >60)
Glucose, Bld: 107 mg/dL — ABNORMAL HIGH (ref 70–99)
Potassium: 4.2 mmol/L (ref 3.5–5.1)
Sodium: 136 mmol/L (ref 135–145)
Total Bilirubin: 1 mg/dL (ref 0.3–1.2)
Total Protein: 6.5 g/dL (ref 6.5–8.1)

## 2021-08-10 LAB — LACTIC ACID, PLASMA: Lactic Acid, Venous: 2.5 mmol/L (ref 0.5–1.9)

## 2021-08-10 LAB — BRAIN NATRIURETIC PEPTIDE: B Natriuretic Peptide: 115.7 pg/mL — ABNORMAL HIGH (ref 0.0–100.0)

## 2021-08-10 LAB — LACTATE DEHYDROGENASE, PLEURAL OR PERITONEAL FLUID: LD, Fluid: 213 U/L — ABNORMAL HIGH (ref 3–23)

## 2021-08-10 LAB — GRAM STAIN

## 2021-08-10 LAB — TROPONIN I (HIGH SENSITIVITY)
Troponin I (High Sensitivity): 6 ng/L (ref <18)
Troponin I (High Sensitivity): 7 ng/L (ref <18)

## 2021-08-10 LAB — PROTEIN, PLEURAL OR PERITONEAL FLUID: Total protein, fluid: <3 g/dL

## 2021-08-10 LAB — LACTATE DEHYDROGENASE: LDH: 166 U/L (ref 98–192)

## 2021-08-10 LAB — HIV ANTIBODY (ROUTINE TESTING W REFLEX): HIV Screen 4th Generation wRfx: NONREACTIVE

## 2021-08-10 IMAGING — CR DG CHEST 2V
3 series · 3 of 3 positions shown · non-contrast
Comparison: [DATE]

CLINICAL DATA: Shortness of breath

EXAM:
CHEST - 2 VIEW

[w chest pa]
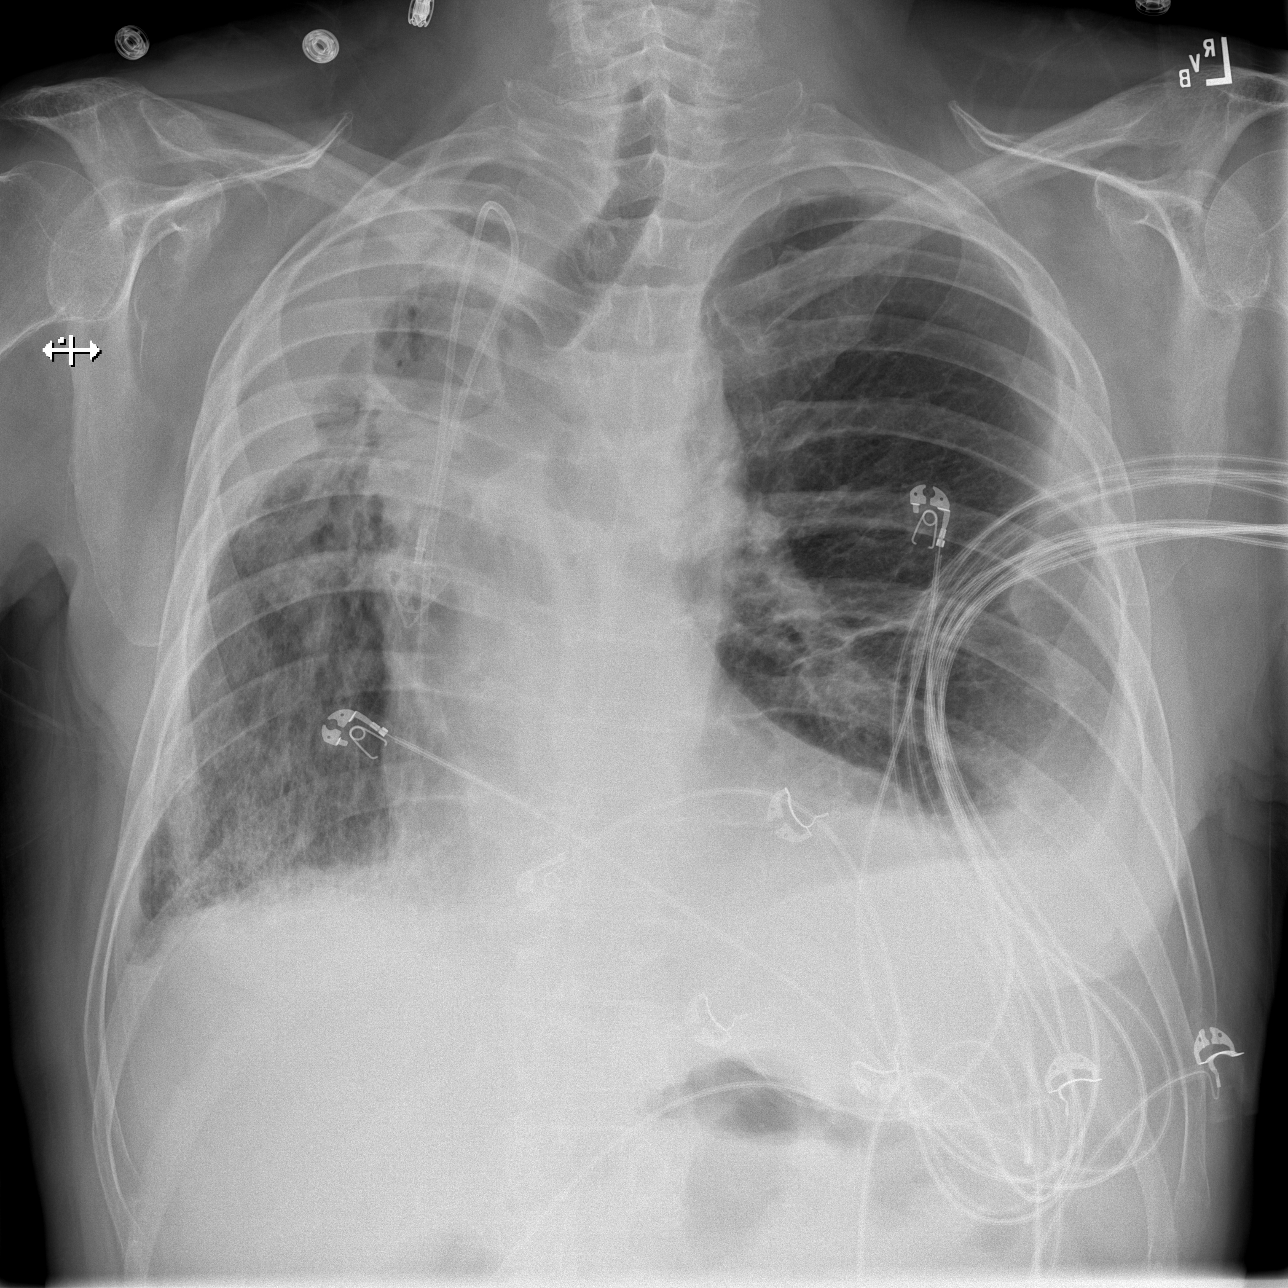

[w chest lat (1 of 2)]
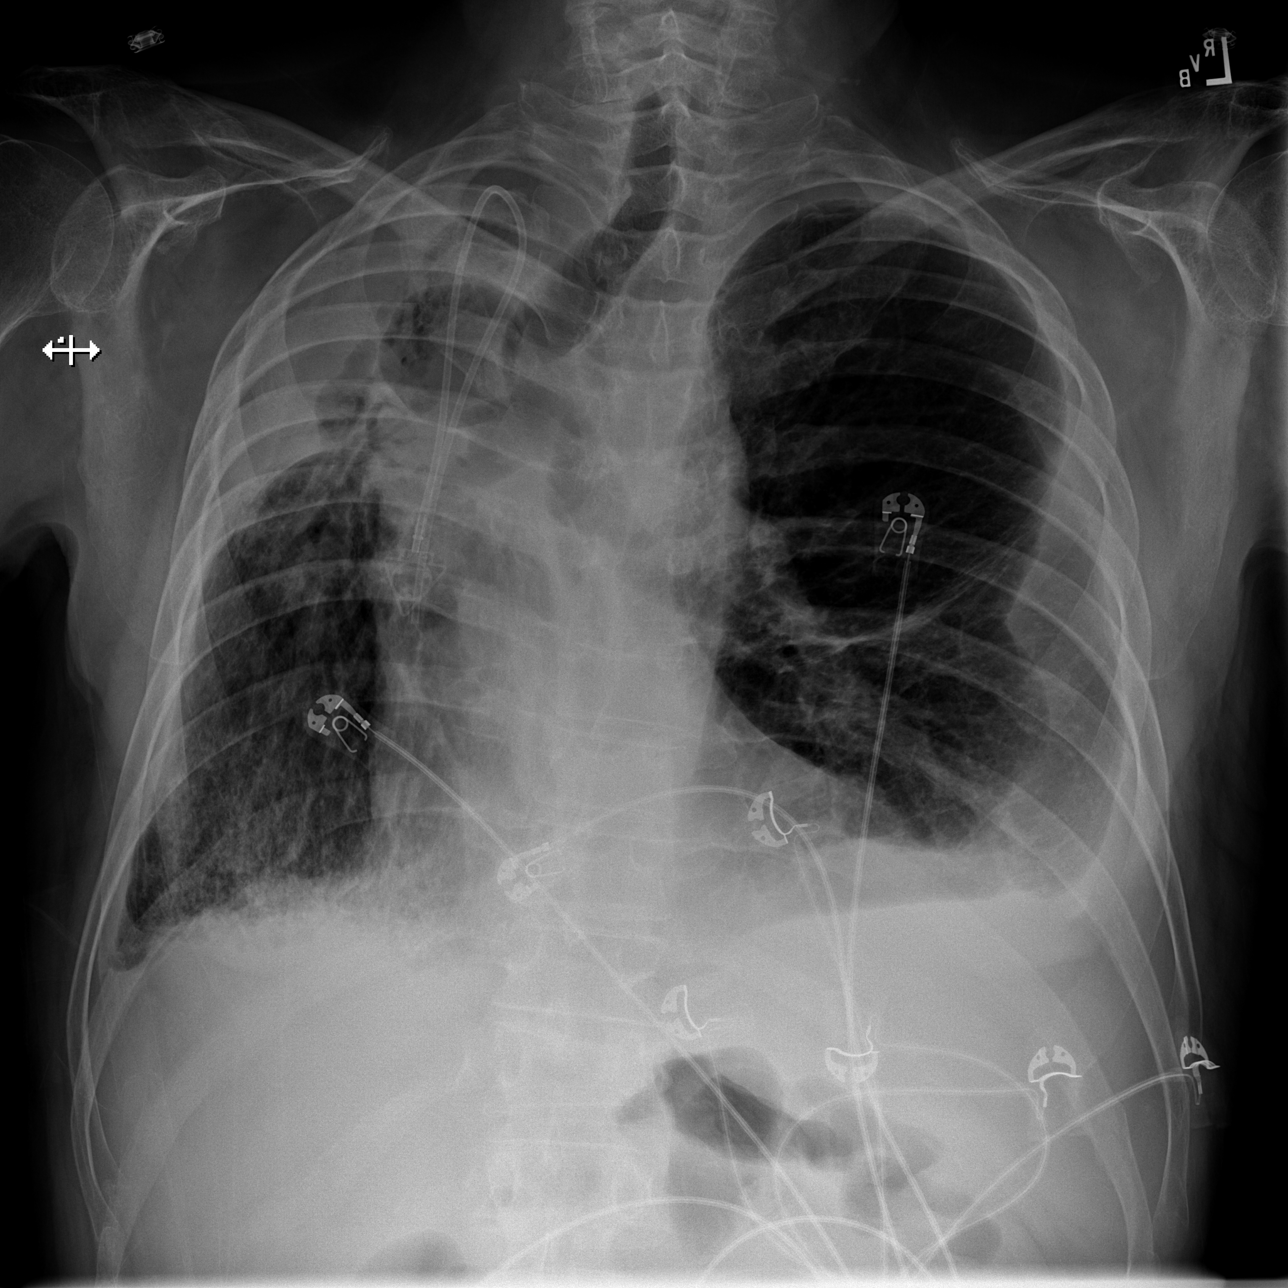

[w chest lat (2 of 2)]
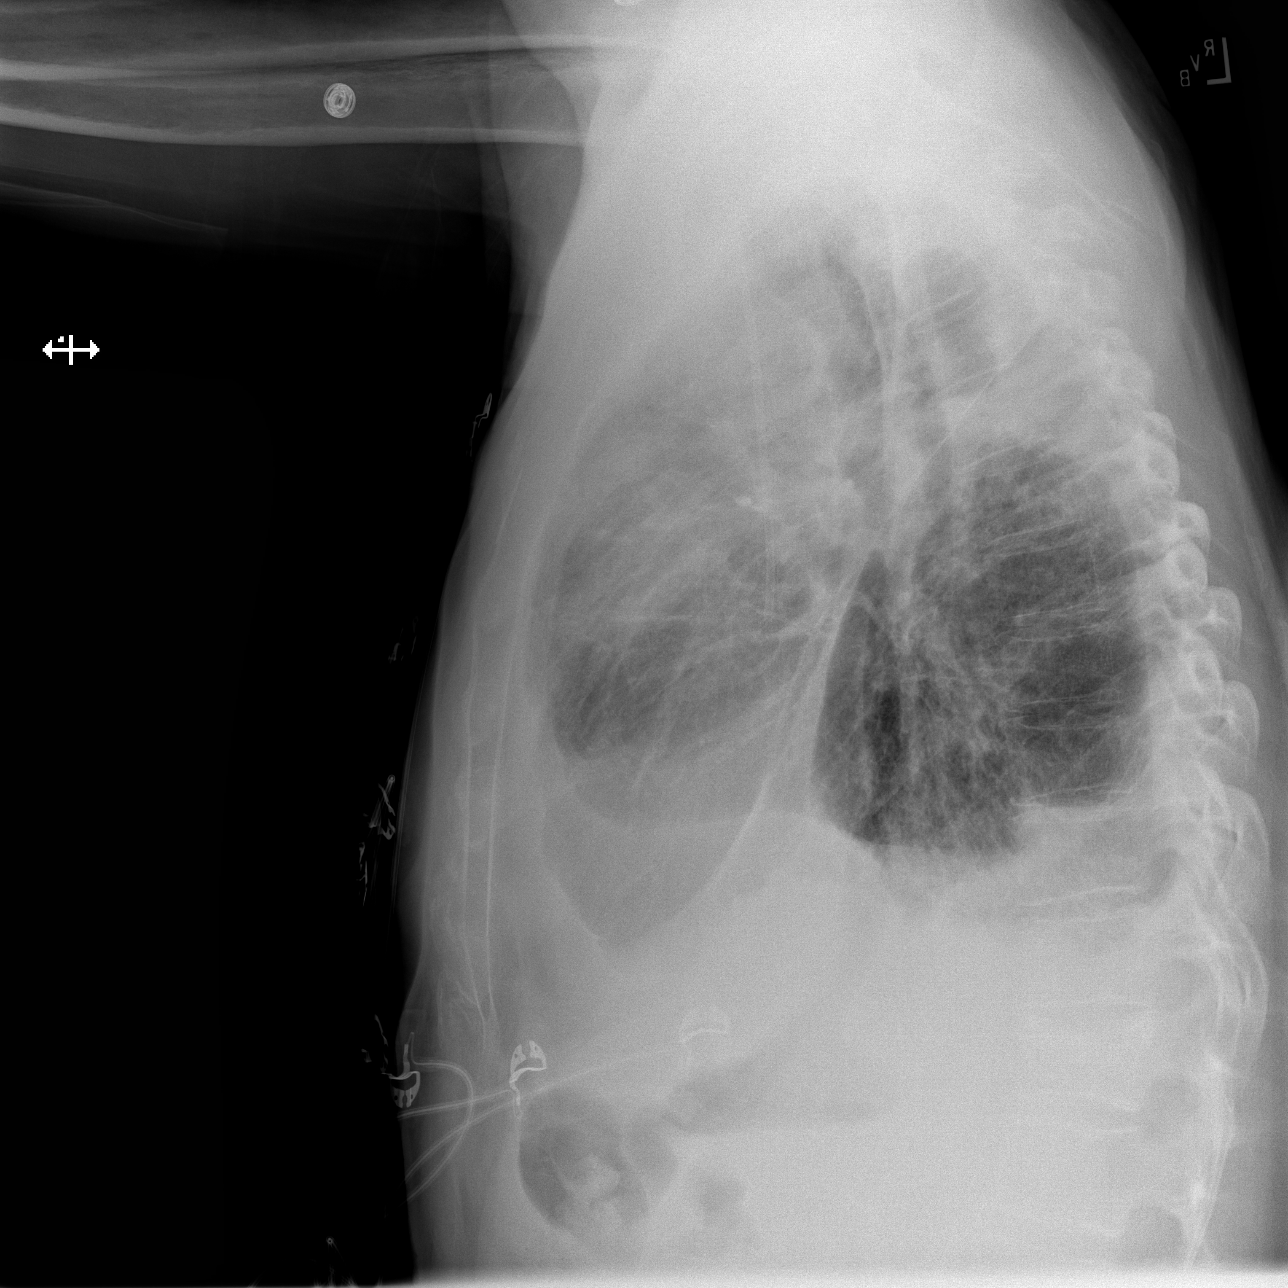

[3 of 3 positions shown; findings below may reference images not displayed]

FINDINGS: Right-sided chest port remains in place. Volume loss with extensive
pleuroparenchymal scarring at the right lung apex, similar in
appearance to the previous study. Chronic blunting of the right
costophrenic angle, likely a component of scarring. Increased
interstitial markings within the right lower lobe. Persistent small
left pleural effusion with likely loculated component. Heart size
within normal limits. No pneumothorax.
IMPRESSION: 1. Persistent small left pleural effusion with likely loculated
component.
2. Increased interstitial markings within the right lower lobe, may
represent developing infiltrate.

## 2021-08-10 IMAGING — CT CT ANGIO CHEST
2 of 6 series · 18 of 36 positions shown · IV contrast (agent unspecified)
Comparison: [DATE]

CLINICAL DATA: Shortness of breath and palpitations. History of
metastatic lung cancer.

EXAM:
CT ANGIOGRAPHY CHEST WITH CONTRAST
TECHNIQUE: Multidetector CT imaging of the chest was performed using the
standard protocol during bolus administration of intravenous
contrast. Multiplanar CT image reconstructions and MIPs were
obtained to evaluate the vascular anatomy.

[Series 6: thins · axial · 0.87mm/px · z∈[+1196,+1500]mm · 17 of 344 slices shown]
[im 20/344  lung]
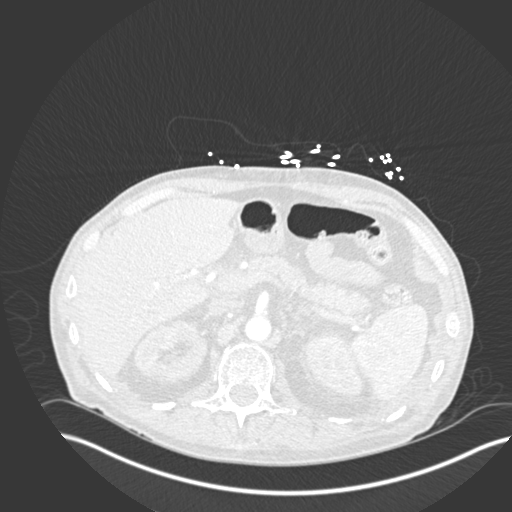
[im 39/344  mediastinal]
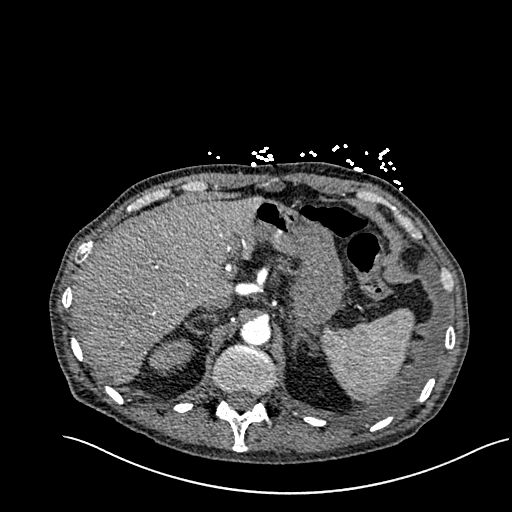
[im 58/344  lung]
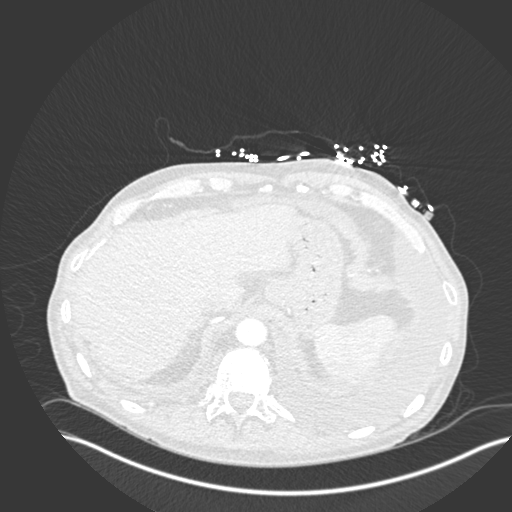
[im 77/344  mediastinal]
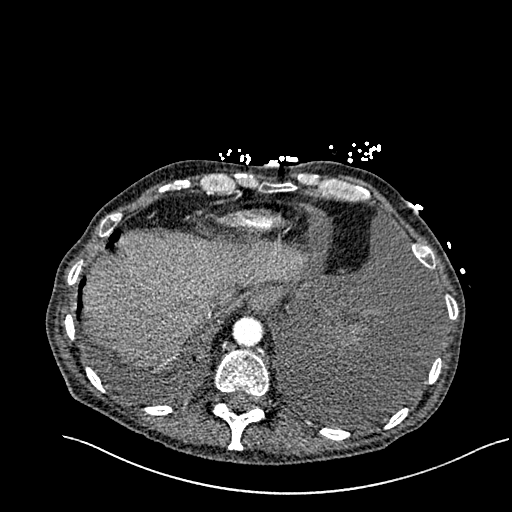
[im 96/344  lung]
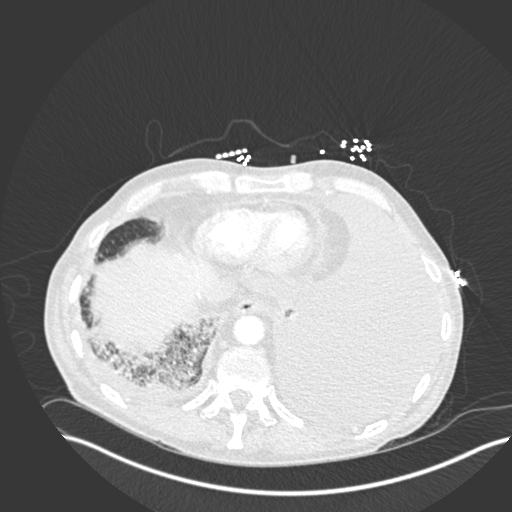
[im 115/344  mediastinal]
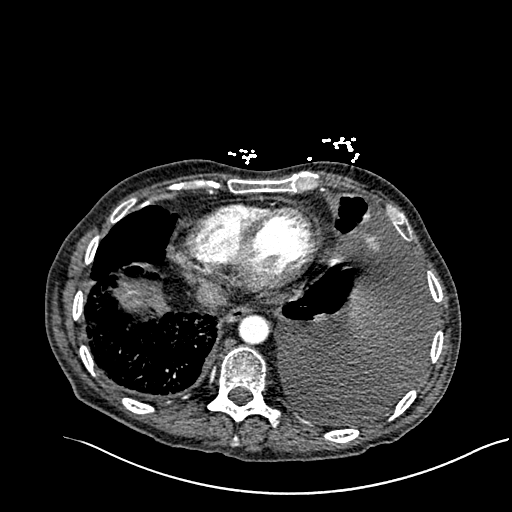
[im 134/344  lung]
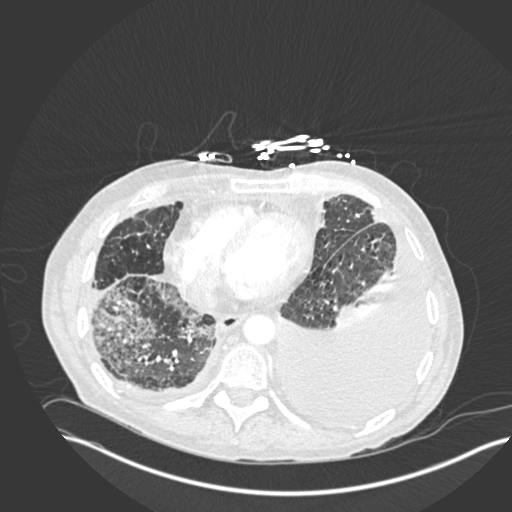
[im 153/344  mediastinal]
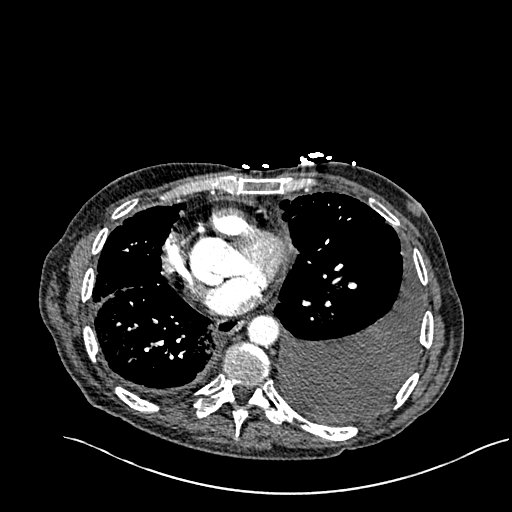
[im 172/344  lung]
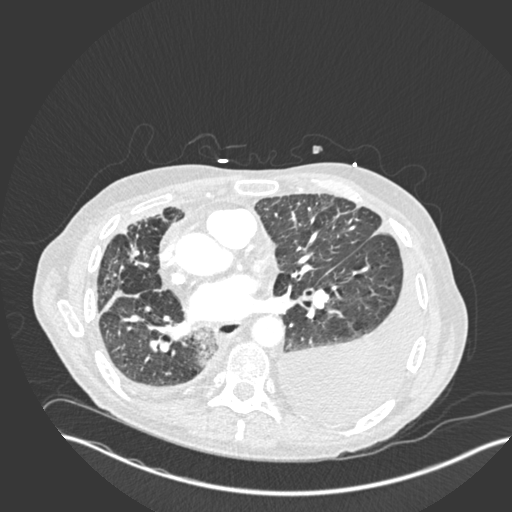
[im 191/344  mediastinal]
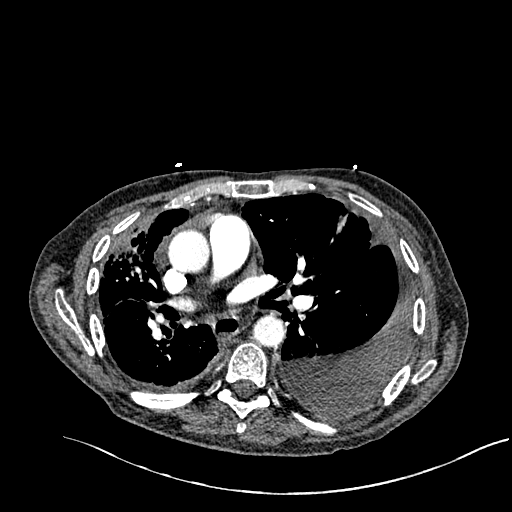
[im 210/344  lung]
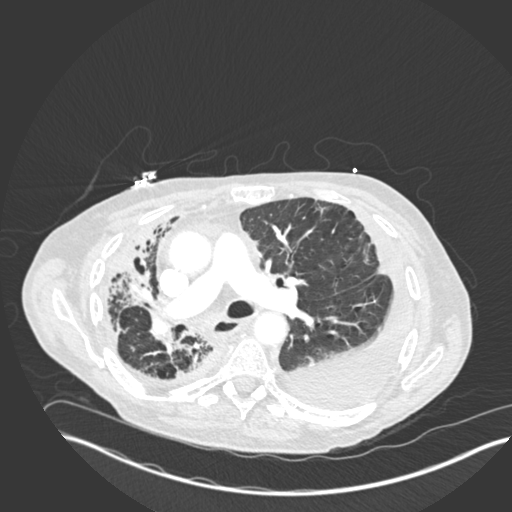
[im 229/344  mediastinal]
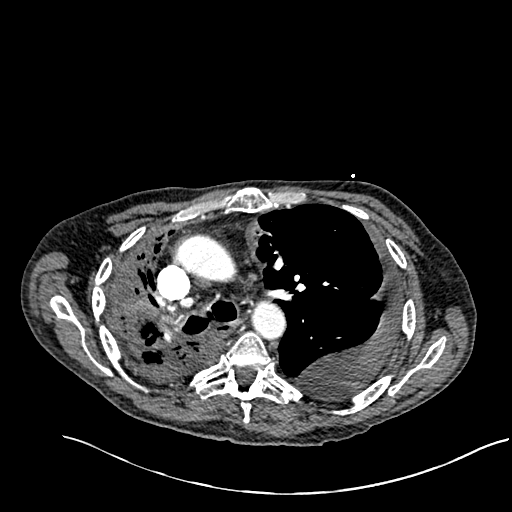
[im 248/344  lung]
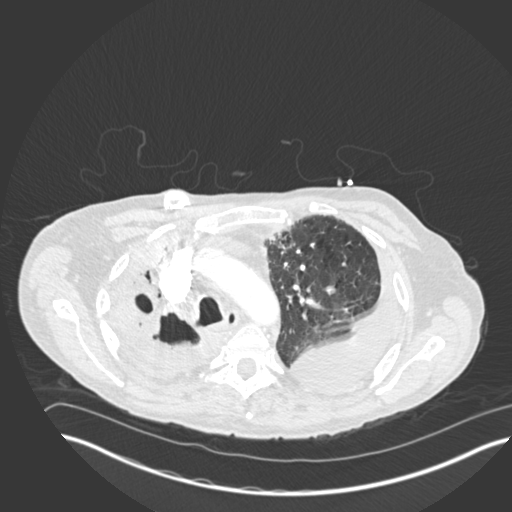
[im 267/344  mediastinal]
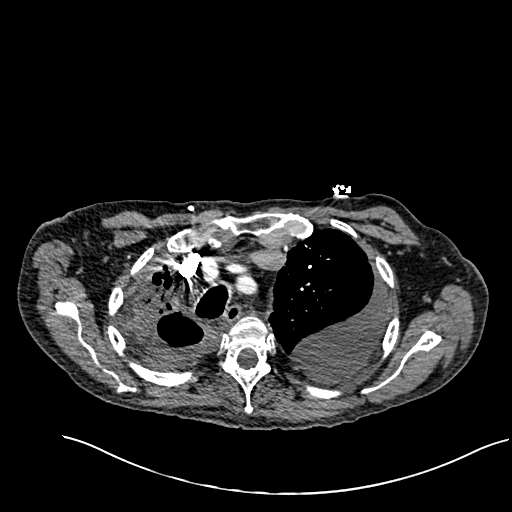
[im 286/344  lung]
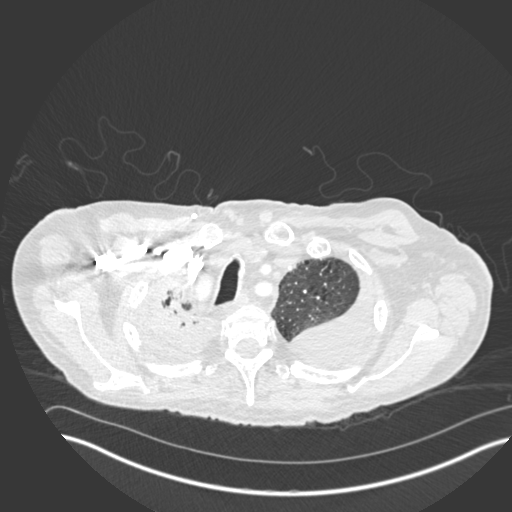
[im 305/344  mediastinal]
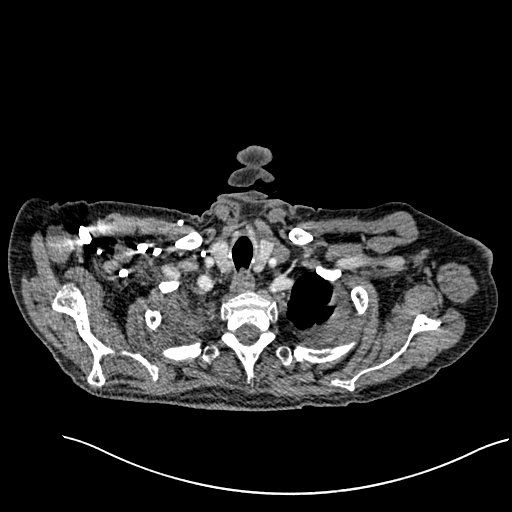
[im 324/344  lung]
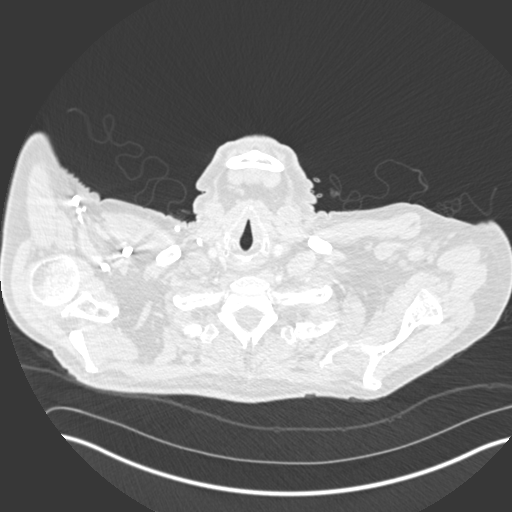

[Series 8: coronal mpr · coronal · 0.68mm/px · 1 of 161 slices shown]
[im 81/161  mediastinal]
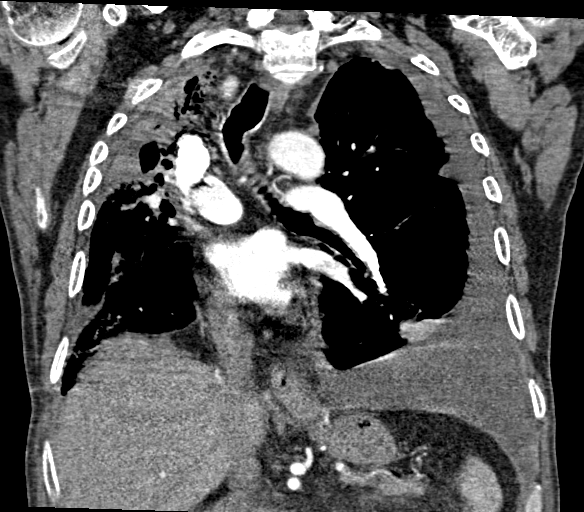

[18 of 36 positions shown; findings below may reference images not displayed]

RADIATION DOSE REDUCTION: This exam was performed according to the
departmental dose-optimization program which includes automated
exposure control, adjustment of the mA and/or kV according to
patient size and/or use of iterative reconstruction technique.

CONTRAST:  80mL OMNIPAQUE IOHEXOL 350 MG/ML SOLN
FINDINGS: Cardiovascular: The heart is normal in size. No pericardial
effusion. Stable mild tortuosity of the thoracic aorta but no
aneurysm or dissection. Stable calcification at the aortic arch.

The pulmonary arterial tree is fairly well opacified. No filling
defects to suggest pulmonary embolism.

Mediastinum/Nodes: No mediastinal or hilar mass or lymphadenopathy.
The esophagus is grossly normal.

Lungs/Pleura: Large left pleural effusion with interval slight
enlargement since the prior examination. There is also a small right
pleural effusion.

Dense post treatment changes involving the right lung apex with
airspace consolidation, air bronchograms and some cystic cavities.

New extensive largely interstitial process in the right lower lobe.
Findings could be due to aspiration, bronchopneumonia and less
likely interstitial spread of tumor.

Upper Abdomen: No significant upper abdominal findings. No definite
hepatic or adrenal gland lesions.

Musculoskeletal: No chest wall mass, supraclavicular or axillary
adenopathy. The Port-A-Cath is stable. The bony thorax is intact.
Stable compression fracture of T12. Stable sclerotic focus involving
the left pedicle at T9.

Review of the MIP images confirms the above findings.
IMPRESSION: 1. No CT findings for pulmonary embolism.
2. Stable tortuosity of the thoracic aorta but no aneurysm or
dissection.
3. Large left pleural effusion with interval slight enlargement
since the prior examination.
4. New extensive interstitial process in the right lower lobe.
Findings could be due to aspiration, bronchopneumonia and less
likely interstitial spread of tumor.
5. Dense post treatment changes involving the right lung apex.
6. No significant upper abdominal findings.
7. Stable T12 compression fracture. Stable sclerotic focus involving
the left pedicle at T9.

Aortic Atherosclerosis ([67]-[67]).

## 2021-08-10 IMAGING — DX DG CHEST 1V
1 series · 1 of 1 positions shown · non-contrast
Comparison: Chest XR and IR ultrasound, earlier same day. CT chest,
[DATE].

CLINICAL DATA: S/p thoracentesis.

EXAM:
PORTABLE CHEST 1 VIEW

[chest ap]
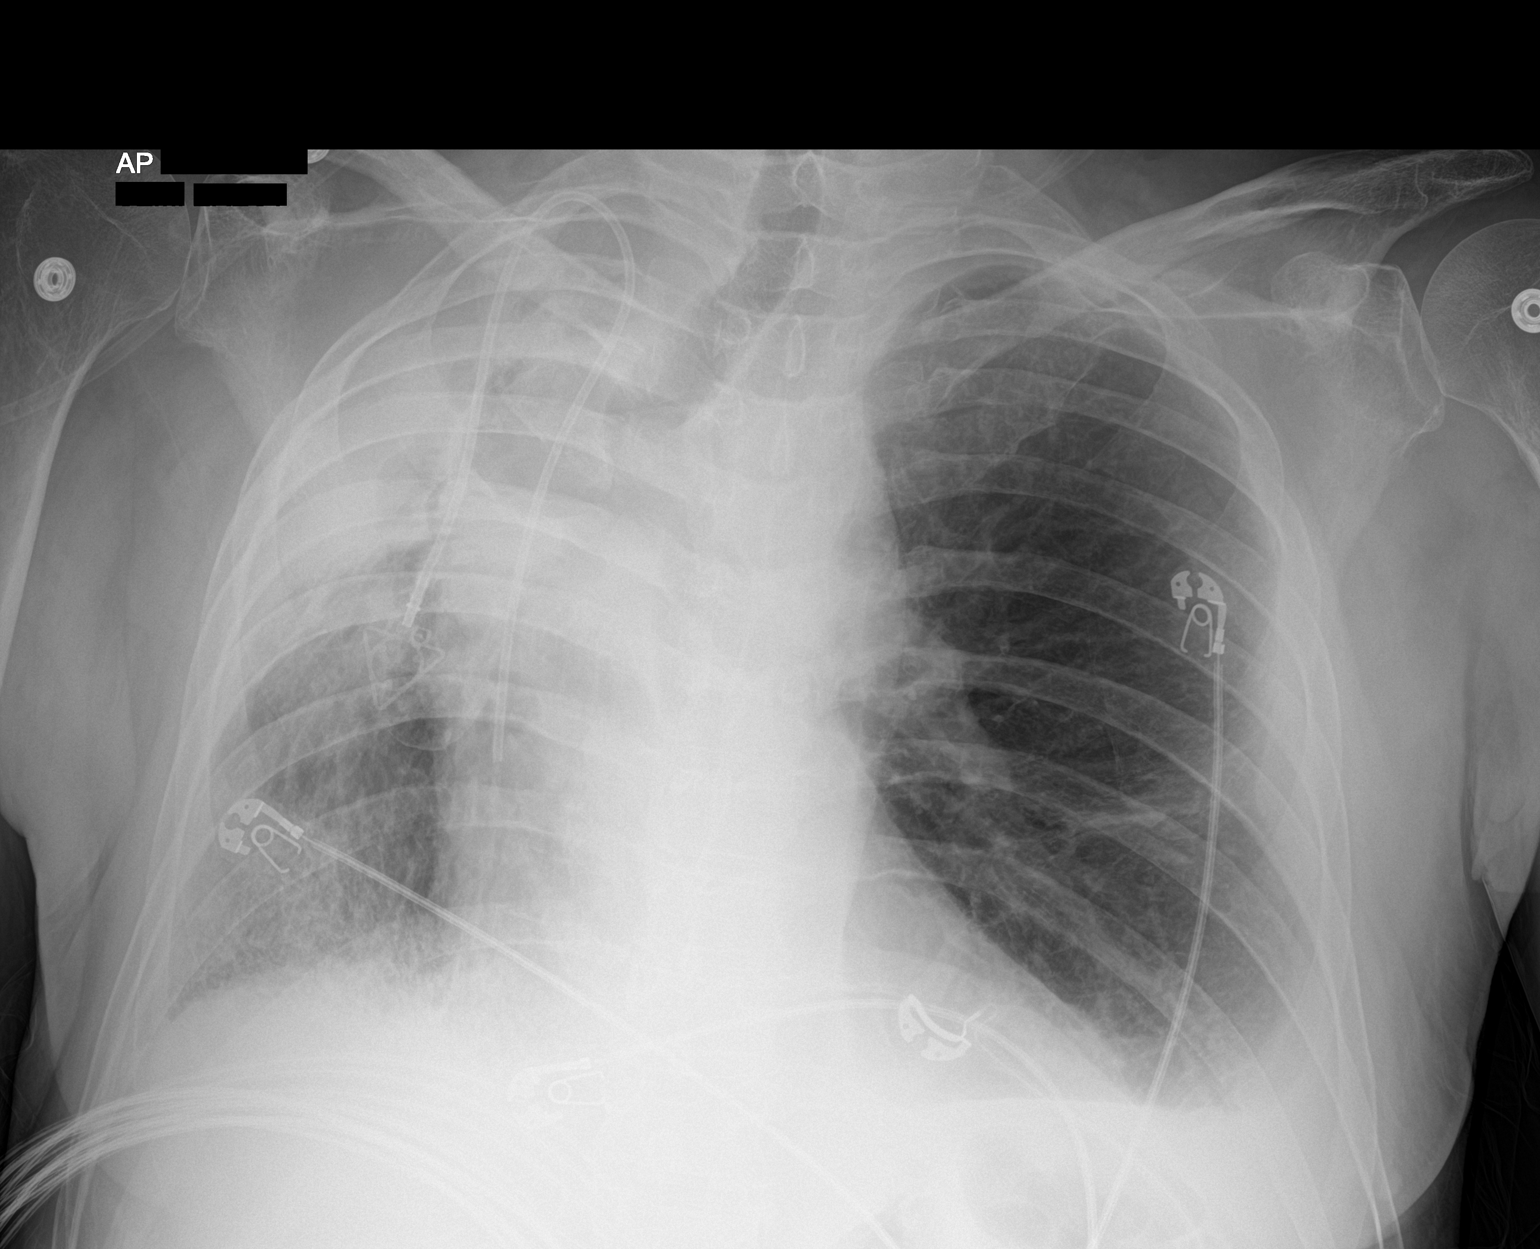

[1 of 1 positions shown; findings below may reference images not displayed]

FINDINGS: Support lines: RIGHT chest port catheter tip at the distal SVC
overlying pacer leads.

Similar findings of chronic-appearing volume loss, pleuroparenchymal
thickening involving the RIGHT upper chest, with rightward deviation
of the mediastinal structures. Linear, coarse interstitial
thickening involving the RIGHT lower lobe. Improved dilation of the
LEFT lung with trace residual pleural effusion. No pneumothorax. No
interval osseous abnormality.
IMPRESSION: 1. Trace residual LEFT pleural effusion.  No pneumothorax.
2. Chronic changes RIGHT upper chest, with worsening of interstitial
thickening within the RIGHT lower lobe. Findings suspicious for
superimposed infection.

## 2021-08-10 IMAGING — US US THORACENTESIS ASP PLEURAL SPACE W/IMG GUIDE
1 series · 8 of 8 positions shown · non-contrast
Comparison: none

INDICATION: Shortness of breath in patient with history of non-small cell lung
cancer, previous imaging showed left pleural effusion. Request for
therapeutic and diagnostic thoracentesis.

[Series 1: us thoracentesis asp pleural s mc & wl · 8 of 8 slices shown]
[im 1/8]
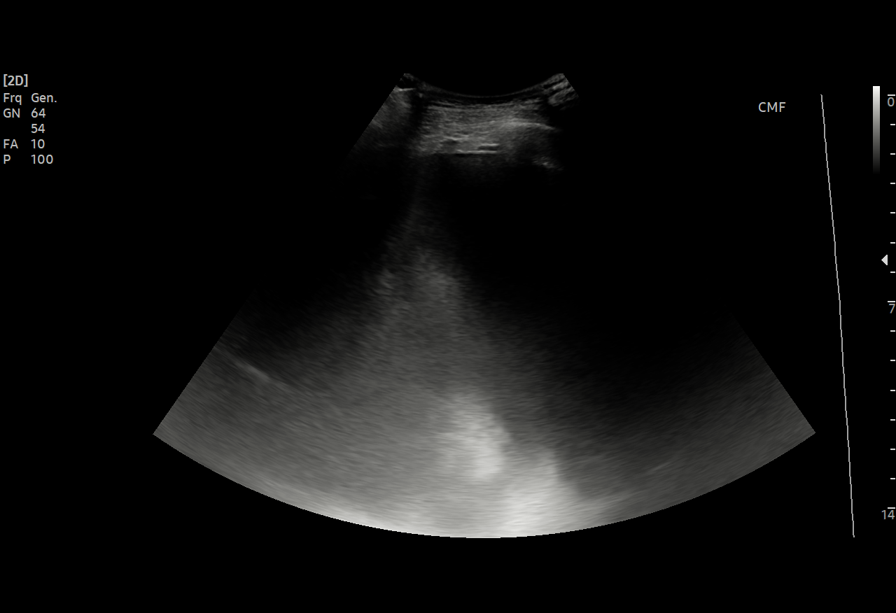
[im 2/8]
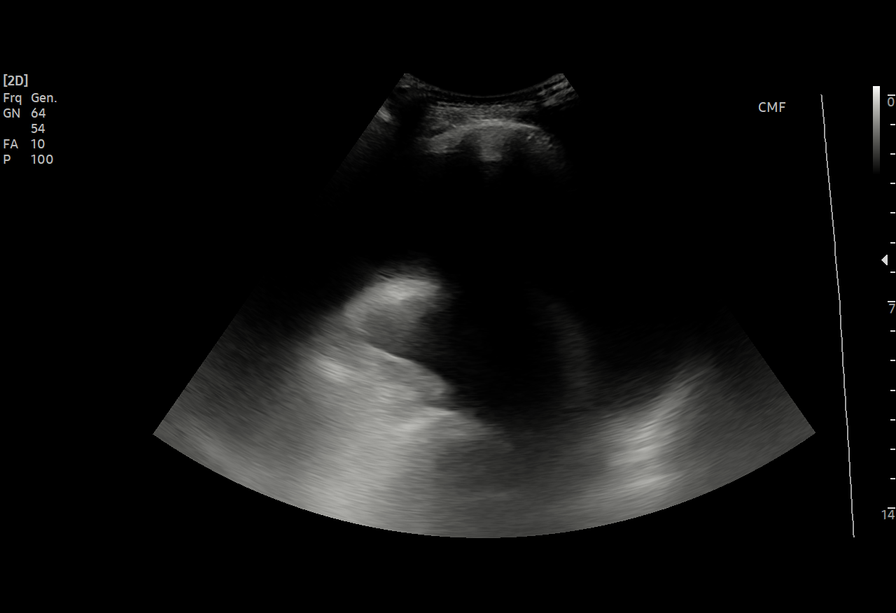
[im 3/8]
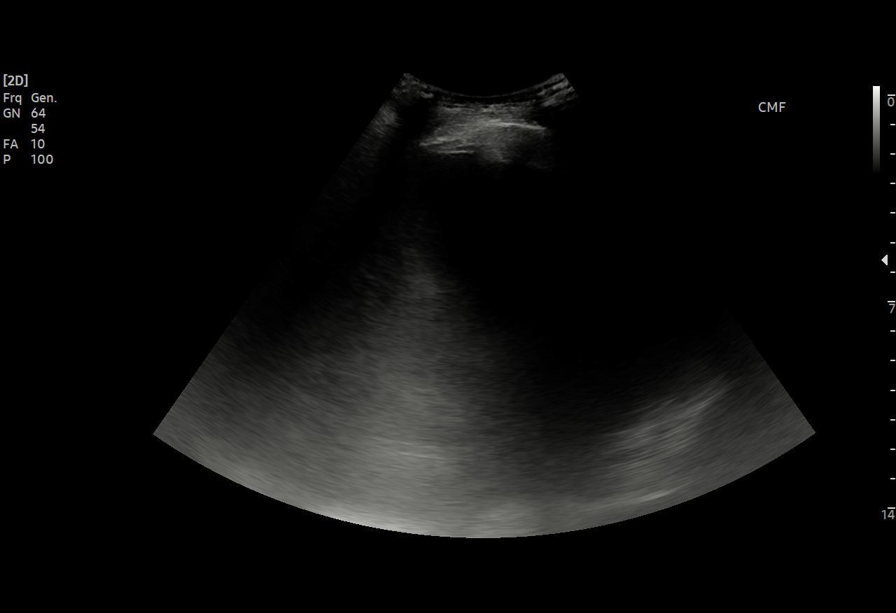
[im 4/8]
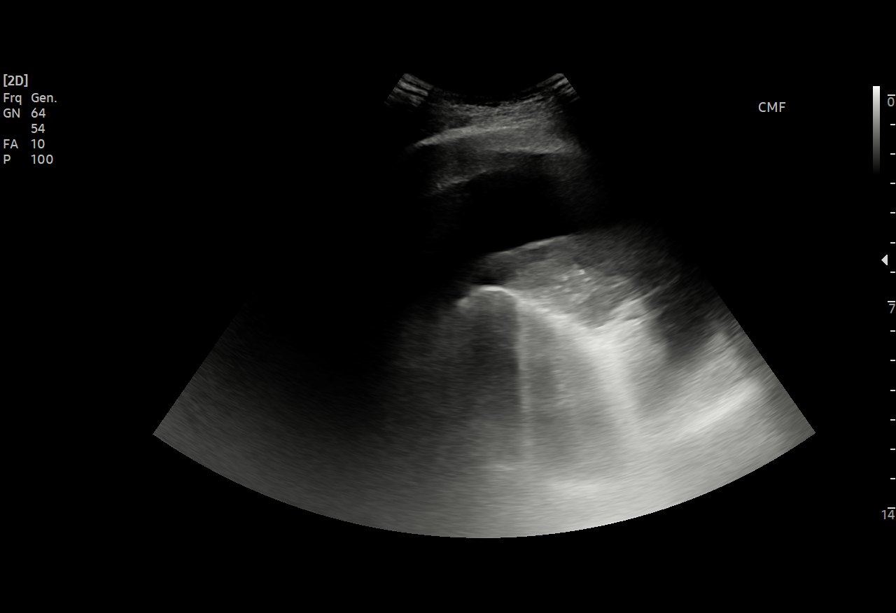
[im 5/8]
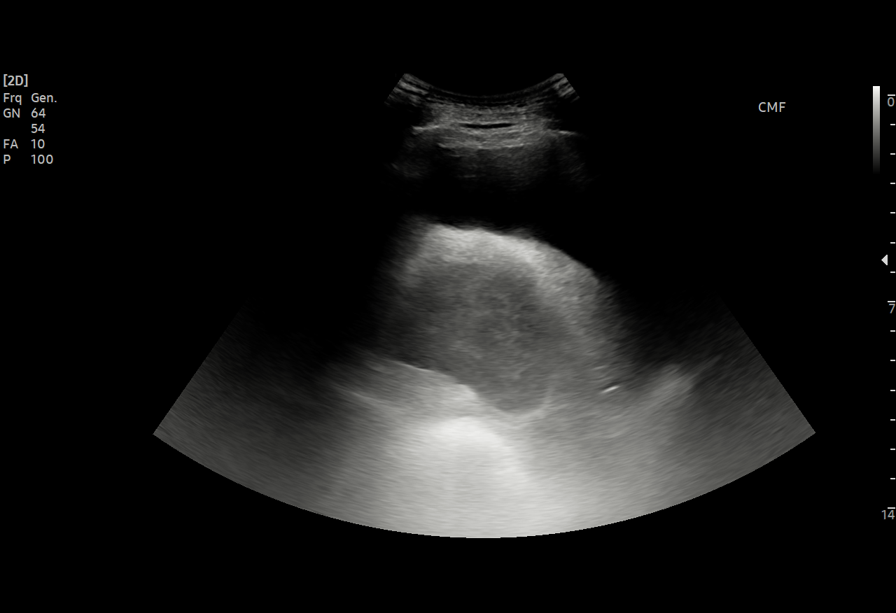
[im 6/8]
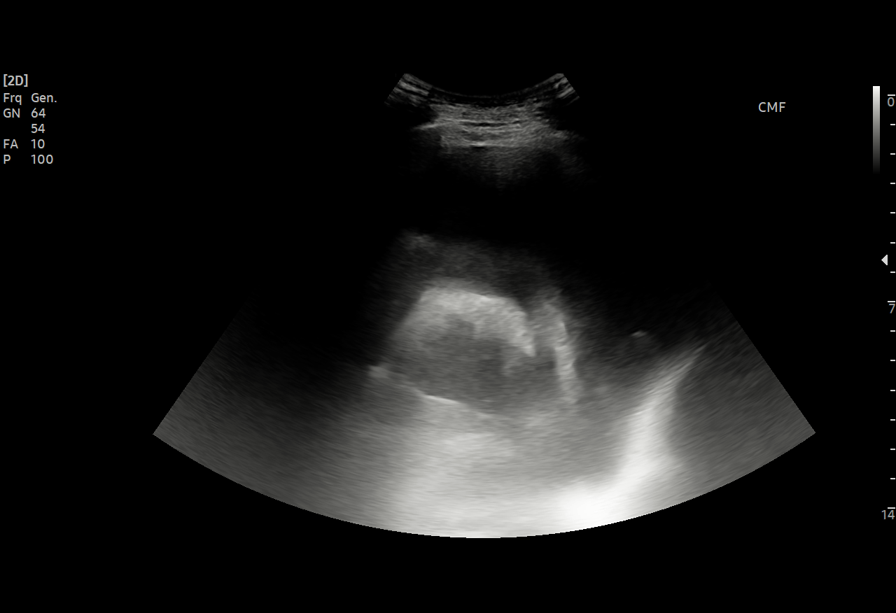
[im 7/8]
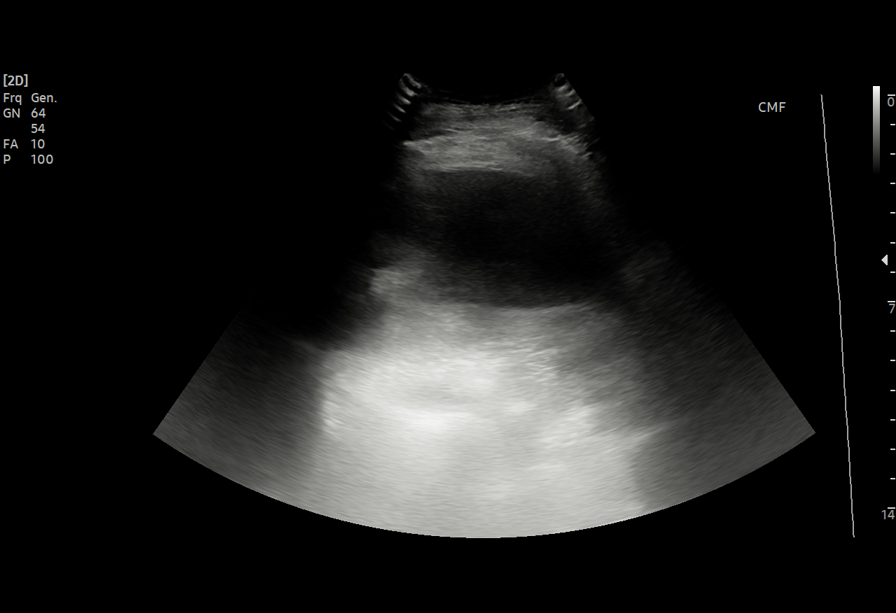
[im 8/8]
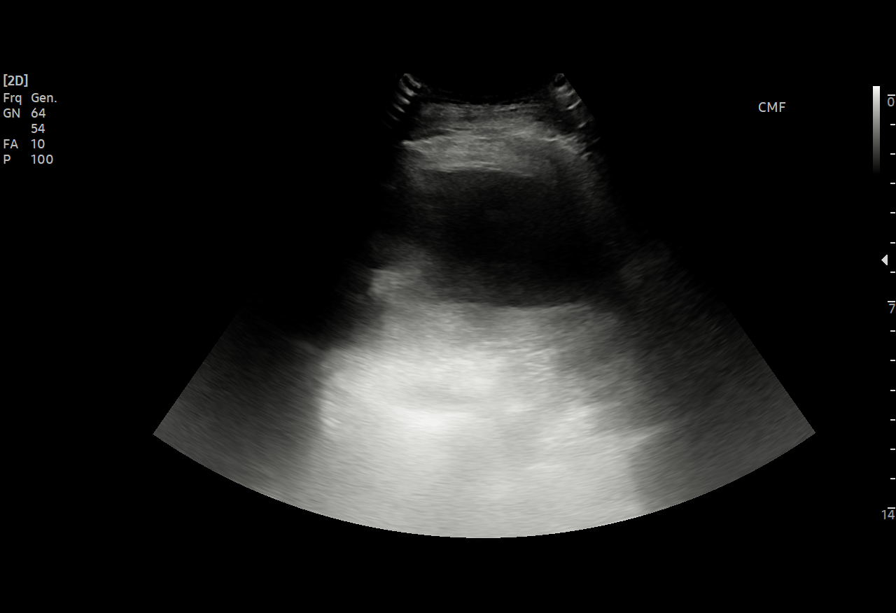

[8 of 8 positions shown; findings below may reference images not displayed]

EXAM:
ULTRASOUND GUIDED LEFT THORACENTESIS

MEDICATIONS:
7 mL 1% lidocaine

COMPLICATIONS:
None immediate.

PROCEDURE:
An ultrasound guided thoracentesis was thoroughly discussed with the
patient and questions answered. The benefits, risks, alternatives
and complications were also discussed. The patient understands and
wishes to proceed with the procedure. Written consent was obtained.

Ultrasound was performed to localize and mark an adequate pocket of
fluid in the LEFT chest. The area was then prepped and draped in the
normal sterile fashion. 1% Lidocaine was used for local anesthesia.
Under ultrasound guidance a 6 Fr Safe-T-Centesis catheter was
introduced. Thoracentesis was performed. The catheter was removed
and a dressing applied.
FINDINGS: A total of approximately 1.7 L of hazy amber fluid was removed.
Samples were sent to the laboratory as requested by the clinical
team.

Postprocedure chest x-ray ordered, results pending.
IMPRESSION: Successful ultrasound guided LEFT, diagnostic and therapeutic
thoracentesis yielding 1.7 L of pleural fluid.

## 2021-08-10 MED ORDER — SODIUM CHLORIDE (PF) 0.9 % IJ SOLN
INTRAMUSCULAR | Status: AC
  Filled 2021-08-10: qty 50

## 2021-08-10 MED ORDER — POLYETHYLENE GLYCOL 3350 17 G PO PACK: 17.0000 g | PACK | Freq: Two times a day (BID) | ORAL | Status: DC | PRN

## 2021-08-10 MED ORDER — SODIUM CHLORIDE 0.9 % IV BOLUS
500.0000 mL | Freq: Once | INTRAVENOUS | Status: AC
  Administered 2021-08-10: 500 mL via INTRAVENOUS

## 2021-08-10 MED ORDER — IPRATROPIUM-ALBUTEROL 0.5-2.5 (3) MG/3ML IN SOLN
3.0000 mL | Freq: Four times a day (QID) | RESPIRATORY_TRACT | Status: DC | PRN
  Administered 2021-08-10 – 2021-08-11 (×2): 3 mL via RESPIRATORY_TRACT
  Filled 2021-08-10 (×2): qty 3

## 2021-08-10 MED ORDER — LIDOCAINE HCL 1 % IJ SOLN
INTRAMUSCULAR | Status: AC
Start: 2021-08-10 — End: 2021-08-10
  Administered 2021-08-10: 10 mL
  Filled 2021-08-10: qty 20

## 2021-08-10 MED ORDER — METRONIDAZOLE 500 MG/100ML IV SOLN
500.0000 mg | Freq: Two times a day (BID) | INTRAVENOUS | Status: DC
  Administered 2021-08-10 – 2021-08-11 (×2): 500 mg via INTRAVENOUS
  Filled 2021-08-10 (×2): qty 100

## 2021-08-10 MED ORDER — ACETAMINOPHEN 325 MG PO TABS
650.0000 mg | ORAL_TABLET | Freq: Four times a day (QID) | ORAL | Status: DC | PRN
  Administered 2021-08-11: 650 mg via ORAL
  Filled 2021-08-10: qty 2

## 2021-08-10 MED ORDER — ONDANSETRON HCL 4 MG/2ML IJ SOLN
4.0000 mg | Freq: Four times a day (QID) | INTRAMUSCULAR | Status: DC | PRN
Start: 2021-08-10 — End: 2021-08-12
  Administered 2021-08-10 – 2021-08-12 (×5): 4 mg via INTRAVENOUS
  Filled 2021-08-10 (×5): qty 2

## 2021-08-10 MED ORDER — SODIUM CHLORIDE 0.9 % IV SOLN: INTRAVENOUS | Status: DC

## 2021-08-10 MED ORDER — OXYCODONE HCL 5 MG PO TABS
5.0000 mg | ORAL_TABLET | Freq: Four times a day (QID) | ORAL | Status: DC | PRN
  Administered 2021-08-10 – 2021-08-12 (×5): 5 mg via ORAL
  Filled 2021-08-10 (×6): qty 1

## 2021-08-10 MED ORDER — SENNOSIDES-DOCUSATE SODIUM 8.6-50 MG PO TABS
1.0000 | ORAL_TABLET | Freq: Two times a day (BID) | ORAL | Status: AC
  Administered 2021-08-10 – 2021-08-11 (×2): 1 via ORAL
  Filled 2021-08-10 (×2): qty 1

## 2021-08-10 MED ORDER — SENNOSIDES-DOCUSATE SODIUM 8.6-50 MG PO TABS: 1.0000 | ORAL_TABLET | Freq: Two times a day (BID) | ORAL | Status: DC | PRN

## 2021-08-10 MED ORDER — ONDANSETRON HCL 4 MG PO TABS: 4.0000 mg | ORAL_TABLET | Freq: Four times a day (QID) | ORAL | Status: DC | PRN

## 2021-08-10 MED ORDER — POLYETHYLENE GLYCOL 3350 17 G PO PACK
17.0000 g | PACK | Freq: Two times a day (BID) | ORAL | Status: AC
  Administered 2021-08-10 – 2021-08-11 (×2): 17 g via ORAL
  Filled 2021-08-10 (×2): qty 1

## 2021-08-10 MED ORDER — IOHEXOL 350 MG/ML SOLN
80.0000 mL | Freq: Once | INTRAVENOUS | Status: AC | PRN
  Administered 2021-08-10: 80 mL via INTRAVENOUS

## 2021-08-10 MED ORDER — VANCOMYCIN HCL 1750 MG/350ML IV SOLN
1750.0000 mg | Freq: Once | INTRAVENOUS | Status: AC
  Administered 2021-08-10: 1750 mg via INTRAVENOUS
  Filled 2021-08-10: qty 350

## 2021-08-10 MED ORDER — SODIUM CHLORIDE 0.9 % IV SOLN
2.0000 g | Freq: Two times a day (BID) | INTRAVENOUS | Status: DC
  Administered 2021-08-10 (×2): 2 g via INTRAVENOUS
  Filled 2021-08-10 (×2): qty 12.5

## 2021-08-10 MED ORDER — ENOXAPARIN SODIUM 40 MG/0.4ML IJ SOSY
40.0000 mg | PREFILLED_SYRINGE | INTRAMUSCULAR | Status: DC
  Administered 2021-08-10 – 2021-08-11 (×2): 40 mg via SUBCUTANEOUS
  Filled 2021-08-10 (×2): qty 0.4

## 2021-08-10 MED ORDER — TRAZODONE HCL 50 MG PO TABS
25.0000 mg | ORAL_TABLET | Freq: Every evening | ORAL | Status: DC | PRN
  Administered 2021-08-11: 25 mg via ORAL
  Filled 2021-08-10: qty 1

## 2021-08-10 MED ORDER — ONDANSETRON HCL 4 MG/2ML IJ SOLN
4.0000 mg | Freq: Once | INTRAMUSCULAR | Status: AC
  Administered 2021-08-10: 4 mg via INTRAVENOUS
  Filled 2021-08-10: qty 2

## 2021-08-10 MED ORDER — ACETAMINOPHEN 650 MG RE SUPP: 650.0000 mg | Freq: Four times a day (QID) | RECTAL | Status: DC | PRN

## 2021-08-10 NOTE — Assessment & Plan Note (Signed)
Likely from dehydration from poor p.o. intake and possible infection. ?-Improved with IV fluid. ?

## 2021-08-10 NOTE — Assessment & Plan Note (Signed)
Normotensive.  Continue home meds after med rec ?

## 2021-08-10 NOTE — Progress Notes (Signed)
A consult was received from an ED provider for vancomycin per pharmacy dosing.  The patient's profile has been reviewed for ht/wt/allergies/indication/available labs.   ? ?A one time order has been placed for vancomycin 1750 mg IV once.   ? ?Further antibiotics/pharmacy consults should be ordered by admitting physician if indicated.       ?                ?Thank you, ?Lenis Noon, PharmD ?08/10/2021  2:47 PM ? ?

## 2021-08-10 NOTE — ED Triage Notes (Signed)
Patient c/o SOB and heart palpitations x 1 1/2 weeks. Patient reports a history of Stage IV lung cancer. ?

## 2021-08-10 NOTE — H&P (Signed)
?History and Physical  ? ? ?Patient: Richard Li IOX:[id number] DOB: [birth year] ?DOA: 08/10/2021 ?DOS: the patient was seen and examined on 08/10/2021 ?PCP: [name], DO  ?Patient coming from: Home.  Lives with wife.  Independently ambulates at baseline. ? ?Chief Complaint:  ?Chief Complaint  ?Patient presents with  ? Shortness of Breath  ? Palpitations  ? 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 ? ?Patient reports shortness of breath for 3 to 4 weeks that has gotten worse after his last chemo on 4/13.  Also reports productive cough with yellowish phlegm that has acutely gotten worse during the same.  He reports runny nose and scratchy throat which is usual for him after chemotherapy.  He also endorses nausea, dry heaving and abdominal pain as result.  He says he has not a bowel movement in 3 to 4 days.  He also endorses poor p.o. intake and poor appetite.  He also reports chronic chest pain that he attributes to coughing and dry heaving.  He denies exertional chest pain.  Chest pain is better with position at times.  He denies UTI symptoms.  He denies focal neuro symptoms but generalized weakness.  He denies fever or chills. ? ?Patient lives with his wife.  Ambulates independently but gets winded and weak easily.  Denies smoking cigarette, drinking alcohol recreational drug use.  He prefers to be DNR. ? ?In ED, tachycardic to 130s but improved to 100 after IV fluid and thoracocentesis.  Normal saturation but donned 2 L by [place].  Afebrile.  CMP without significant finding.  Hgb 9.9 (about baseline).  WBC 10.4.  Lactic acid 2.5.  BNP 115.  Troponin 6> 7.  CT angio chest PE protocol with large left pleural effusion and RLL infiltrate.  Cultures obtained.  Received IV fluid boluses 500 cc x 2, vancomycin and cefepime and hospitalist service called for  admission ? ?  ? ?.HOSP[ ?Review of Systems: As mentioned in the history of present illness. All other systems reviewed and are negative. ?Past Medical History:  ?Diagnosis Date  ? Allergic rhinitis, cause unspecified   ? Anxiety state, unspecified   ? Asthma   ? as a child  ? Chronic airway obstruction, not elsewhere classified   ? Esophageal reflux   ? History of kidney stones   ? History of radiation therapy 02/13/2020-03/24/2020  ? right lung        Dr Gery Pray  ? Hypertension   ? Lumbago   ? lung ca dx'd 12/2019  ? Other and unspecified hyperlipidemia   ? Other chest pain   ? ?Past Surgical History:  ?Procedure Laterality Date  ? APPENDECTOMY    ? BRONCHIAL BIOPSY  01/07/2020  ? Procedure: BRONCHIAL BIOPSIES;  Surgeon: Collene Gobble, MD;  Location: [facility] ENDOSCOPY;  Service: Pulmonary;;  ? BRONCHIAL BIOPSY  04/05/2020  ? Procedure: BRONCHIAL BIOPSIES;  Surgeon: Rigoberto Noel, MD;  Location: [place];  Service: Cardiopulmonary;;  ? BRONCHIAL BRUSHINGS  01/07/2020  ? Procedure: BRONCHIAL BRUSHINGS;  Surgeon: Collene Gobble, MD;  Location: [facility] ENDOSCOPY;  Service: Pulmonary;;  ? BRONCHIAL NEEDLE ASPIRATION BIOPSY  01/07/2020  ? Procedure: BRONCHIAL NEEDLE ASPIRATION BIOPSIES;  Surgeon: Collene Gobble, MD;  Location: [facility] ENDOSCOPY;  Service: Pulmonary;;  ? BRONCHIAL WASHINGS  01/07/2020  ? Procedure: BRONCHIAL WASHINGS;  Surgeon: Baltazar Apo  S, MD;  Location: [place];  Service: Pulmonary;;  ? BRONCHIAL WASHINGS  04/05/2020  ? Procedure: BRONCHIAL WASHINGS;  Surgeon: Rigoberto Noel, MD;  Location: [facility] ENDOSCOPY;  Service: Cardiopulmonary;;  ? COLONOSCOPY  15 years ago  ? HEMOSTASIS CONTROL  01/07/2020  ? Procedure: HEMOSTASIS CONTROL;  Surgeon: Collene Gobble, MD;  Location: [facility] ENDOSCOPY;  Service: Pulmonary;;  cold saline  ? IR IMAGING GUIDED PORT INSERTION  09/18/2020  ? NASAL TURBINATE REDUCTION  2002  ? Dr.Crossley  ? THORACENTESIS N/A 05/28/2021  ? Procedure: THORACENTESIS;  Surgeon: Lanier Clam,  MD;  Location: [facility] ENDOSCOPY;  Service: Pulmonary;  Laterality: N/A;  ? VASECTOMY    ? VIDEO BRONCHOSCOPY Right 04/05/2020  ? Procedure: VIDEO BRONCHOSCOPY WITH FLUORO;  Surgeon: Rigoberto Noel, MD;  Location: [place];  Service: Cardiopulmonary;  Laterality: Right;  ? VIDEO BRONCHOSCOPY WITH ENDOBRONCHIAL NAVIGATION N/A 01/07/2020  ? Procedure: VIDEO BRONCHOSCOPY WITH ENDOBRONCHIAL NAVIGATION;  Surgeon: Collene Gobble, MD;  Location: [facility] ENDOSCOPY;  Service: Pulmonary;  Laterality: N/A;  ? VIDEO BRONCHOSCOPY WITH ENDOBRONCHIAL ULTRASOUND N/A 01/07/2020  ? Procedure: VIDEO BRONCHOSCOPY WITH ENDOBRONCHIAL ULTRASOUND;  Surgeon: Collene Gobble, MD;  Location: [facility] ENDOSCOPY;  Service: Pulmonary;  Laterality: N/A;  ? ?Social History:  reports that he quit smoking about 16 years ago. His smoking use included cigarettes. He has a 56.00 pack-year smoking history. He has never used smokeless tobacco. He reports that he does not currently use alcohol after a past usage of about 12.0 standard drinks per week. He reports that he does not use drugs. ? ?Allergies  ?Allergen Reactions  ? Penicillins   ?  Unknown reaction  ? Amoxicillin Rash  ? ? ?Family History  ?Problem Relation Age of Onset  ? Breast cancer Mother   ? Cancer Father   ? Heart attack Brother   ? Emphysema Maternal Uncle   ? COPD Maternal Uncle   ? Heart disease Maternal Uncle   ? Colon cancer Neg Hx   ? ? ?Prior to Admission medications   ?Medication Sig Start Date End Date Taking? Authorizing Provider  ?acetaminophen (TYLENOL) 500 MG tablet Take 1,000 mg by mouth every 8 (eight) hours as needed for moderate pain or mild pain.   Yes [provider]  ?dextromethorphan-guaiFENesin (MUCINEX DM) 30-600 MG 12hr tablet Take 1 tablet by mouth 2 (two) times daily.   Yes [provider]  ?feeding supplement (ENSURE ENLIVE / ENSURE PLUS) LIQD Take 237 mLs by mouth 3 (three) times daily between meals. ?Patient taking differently: Take 237 mLs by mouth  daily. 05/11/20  Yes Sheikh, Omair Latif, DO  ?fenofibrate 160 MG tablet Take 1 tablet by mouth once daily ?Patient taking differently: Take 160 mg by mouth daily. 05/17/21  Yes [name], DO  ?folic acid (FOLVITE) 1 MG tablet Take 1 tablet by mouth once daily ?Patient taking differently: Take 1 mg by mouth daily. 07/21/21  Yes Curt Bears, MD  ?Ipratropium-Albuterol (COMBIVENT RESPIMAT) 20-100 MCG/ACT AERS respimat INHALE 1 TO 2 PUFFS BY MOUTH EVERY 6 HOURS AS NEEDED FOR WHEEZING ?Patient taking differently: Inhale 1-2 puffs into the lungs every 6 (six) hours as needed for wheezing. 07/01/21  Yes Hunsucker, Bonna Gains, MD  ?lactulose (CHRONULAC) 10 GM/15ML solution TAKE 15 MLS BY MOUTH 3 TIMES DAILY Strength: 10 GM/15ML ?Patient taking differently: Take 15 g by mouth daily as needed for mild constipation. 06/03/21  Yes Heilingoetter, Cassandra L, PA-C  ?lidocaine-prilocaine (EMLA) cream Apply 1 application topically as  needed. ?Patient taking differently: Apply 1 application. topically daily as needed (port site access). 09/30/20  Yes Heilingoetter, Cassandra L, PA-C  ?LORazepam (ATIVAN) 1 MG tablet Take 1 tablet (1 mg total) by mouth every 8 (eight) hours as needed for anxiety. 06/18/21  Yes [name], DO  ?Multiple Vitamin (MULTIVITAMIN WITH MINERALS) TABS tablet Take 1 tablet by mouth daily. 03/18/20  Yes Mercy Riding, MD  ?oxyCODONE-acetaminophen (PERCOCET/ROXICET) 5-325 MG tablet Take 1 tablet by mouth every 6 (six) hours as needed for severe pain. 08/05/21  Yes Heilingoetter, Cassandra L, PA-C  ?potassium chloride SA (KLOR-CON M) 20 MEQ tablet Take 1 tablet (20 mEq total) by mouth daily. 08/05/21  Yes Heilingoetter, Cassandra L, PA-C  ?predniSONE (DELTASONE) 10 MG tablet Take 1 tablet (10 mg total) by mouth daily with breakfast. ?Patient taking differently: Take 10 mg by mouth daily with breakfast. Continuous 05/28/21  Yes Hunsucker, Bonna Gains, MD  ?prochlorperazine (COMPAZINE) 10 MG tablet Take 1 tablet (10  mg total) by mouth every 6 (six) hours as needed. ?Patient taking differently: Take 10 mg by mouth every 6 (six) hours as needed for nausea or vomiting. 09/14/20  Yes Heilingoetter, Cassandra L, PA-C  ?ferr

## 2021-08-10 NOTE — Assessment & Plan Note (Signed)
Suspect this to be dehydration from poor p.o. intake.  Could be from infection as well.  Received a liter of NS bolus in ED ?-Recheck ?

## 2021-08-10 NOTE — Assessment & Plan Note (Signed)
Likely from chemotherapy and malignancy. ?-Consulted dietitian ?

## 2021-08-10 NOTE — ED Provider Notes (Signed)
?[facility] DEPT ?Provider Note ? ? ?CSN: [id number] ?Arrival date & time: 08/10/21  0934 ? ?  ? ?History ? ?Chief Complaint  ?Patient presents with  ? Shortness of Breath  ? Palpitations  ? ? ?Richard Li is a 61 y.o. male. ? 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 Patient states he is typically able to ambulate around the room a room with mild dyspnea but now requires a wheelchair. States the shortness of breath has been gradually increasing with each day. Patient elicits associated increase in productive cough with green-yellow sputum, fatigue, pleuritic chest pain, fast heart beat, and odynophagia. Denies HA, vision changes, hearing changes, lightheadedness, syncope, numbness/tingling/weakness, trouble speaking, confusion, rhinorrhea, hemoptysis, abdominal pain, change in bowel habits, and change in urinary habits.  ? Of note, Last chemo treatment was 4/13. Pt states symptoms began prior to this. He also had an appointment with IR on 4/17 for thoracentesis. pleural fluid was minimal in the right and left lung bases and the patient decided to defer to procedure due to feeling unwell. Denies recent illness. Denies sick contacts. ?He also reports a history of issues with swallowing, reports having a swallow study done and was told that it was "normal".  He continues to have episodes of coughing and feeling like he chokes on his food, states he had an episode last night and several days prior.  ? ?Home Medications ?Prior to Admission medications   ?Medication Sig Start Date End Date Taking? Authorizing Provider  ?acetaminophen (TYLENOL) 500 MG tablet Take 1,000 mg by mouth every 8 (eight) hours as needed for moderate pain or mild pain.    [provider]  ?dextromethorphan-guaiFENesin (MUCINEX DM)  30-600 MG 12hr tablet Take 1 tablet by mouth 2 (two) times daily.    [provider]  ?feeding supplement (ENSURE ENLIVE / ENSURE PLUS) LIQD Take 237 mLs by mouth 3 (three) times daily between meals. ?Patient taking differently: Take 237 mLs by mouth daily. 05/11/20   Raiford Noble Latif, DO  ?fenofibrate 160 MG tablet Take 1 tablet by mouth once daily ?Patient taking differently: Take 160 mg by mouth daily. 05/17/21   [name], DO  ?ferrous sulfate 325 (65 FE) MG tablet Take 1 tablet (325 mg total) by mouth daily with breakfast. 05/12/20   Kerney Elbe, DO  ?folic acid (FOLVITE) 1 MG tablet Take 1 tablet by mouth once daily ?Patient taking differently: Take 1 mg by mouth daily. 07/21/21   Curt Bears, MD  ?Ipratropium-Albuterol (COMBIVENT RESPIMAT) 20-100 MCG/ACT AERS respimat INHALE 1 TO 2 PUFFS BY MOUTH EVERY 6 HOURS AS NEEDED FOR WHEEZING ?Patient taking differently: Inhale 1-2 puffs into the lungs every 6 (six) hours as needed for wheezing. 07/01/21   Hunsucker, Bonna Gains, MD  ?lactulose (CHRONULAC) 10 GM/15ML solution TAKE 15 MLS BY MOUTH 3 TIMES DAILY Strength: 10 GM/15ML ?Patient taking differently: Take 15 g by mouth. TAKE 15 MLS BY MOUTH 3 TIMES DAILY Strength: 10 GM/15ML 06/03/21   Heilingoetter, Cassandra L, PA-C  ?lidocaine-prilocaine (EMLA) cream Apply 1 application topically as needed. ?Patient taking differently: Apply 1 application. topically daily as needed (port site access). 09/30/20   Heilingoetter, Cassandra L, PA-C  ?LORazepam (ATIVAN) 1 MG tablet Take 1 tablet (1 mg total) by mouth every 8 (eight) hours as needed for anxiety. 06/18/21   [name],  DO  ?Multiple Vitamin (MULTIVITAMIN WITH MINERALS) TABS tablet Take 1 tablet by mouth daily. 03/18/20   Mercy Riding, MD  ?oxyCODONE-acetaminophen (PERCOCET/ROXICET) 5-325 MG tablet Take 1 tablet by mouth every 6 (six) hours as needed for severe pain. 08/05/21   Heilingoetter, Cassandra L, PA-C  ?potassium chloride SA (KLOR-CON  M) 20 MEQ tablet Take 1 tablet (20 mEq total) by mouth daily. 08/05/21   Heilingoetter, Cassandra L, PA-C  ?predniSONE (DELTASONE) 10 MG tablet Take 1 tablet (10 mg total) by mouth daily with breakfast. ?Patient taking differently: Take 10 mg by mouth daily with breakfast. Continuous 05/28/21   Hunsucker, Bonna Gains, MD  ?prochlorperazine (COMPAZINE) 10 MG tablet Take 1 tablet (10 mg total) by mouth every 6 (six) hours as needed. ?Patient taking differently: Take 10 mg by mouth every 6 (six) hours as needed for nausea or vomiting. 09/14/20   Heilingoetter, Cassandra L, PA-C  ?sucralfate (CARAFATE) 1 g tablet Take 1 tablet (1 g total) by mouth 4 (four) times daily -  with meals and at bedtime. ?Patient not taking: Reported on 03/05/2020 03/02/20 03/17/20  Curt Bears, MD  ?   ? ?Allergies    ?Penicillins and Amoxicillin   ? ?Review of Systems   ?Review of Systems ?Ten systems reviewed and are negative for acute change, except as noted in the HPI.  ? ?Physical Exam ?Updated Vital Signs ?BP 128/65 (BP Location: Left Arm)   Pulse (!) 129   Temp 99.6 ?F (37.6 ?C) (Oral)   Resp (!) 25   Ht 5\' 9"  (1.753 m)   Wt 80.7 kg   SpO2 94%   BMI 26.29 kg/m?  ?Physical Exam ?Vitals and nursing note reviewed.  ?Constitutional:   ?   Appearance: He is well-developed. He is ill-appearing.  ?   Comments: Uncomfortable appearing  ?HENT:  ?   Head: Normocephalic and atraumatic.  ?Eyes:  ?   Conjunctiva/sclera: Conjunctivae normal.  ?Cardiovascular:  ?   Rate and Rhythm: Regular rhythm. Tachycardia present.  ?   Heart sounds: No murmur heard. ?Pulmonary:  ?   Effort: Tachypnea and accessory muscle usage present. No respiratory distress.  ?   Breath sounds: Examination of the right-upper field reveals decreased breath sounds. Examination of the left-middle field reveals decreased breath sounds. Examination of the left-lower field reveals decreased breath sounds. Decreased breath sounds and wheezing present.  ?Chest:  ?   Chest wall: No  tenderness.  ?Abdominal:  ?   Palpations: Abdomen is soft.  ?   Tenderness: There is no abdominal tenderness.  ?Musculoskeletal:     ?   General: No swelling.  ?   Cervical back: Neck supple.  ?   Right lower leg: Edema present.  ?   Left lower leg: Edema present.  ?   Comments: Trace edema to bilateral lower extremities  ?Skin: ?   General: Skin is warm and dry.  ?   Capillary Refill: Capillary refill takes less than 2 seconds.  ?Neurological:  ?   General: No focal deficit present.  ?   Mental Status: He is alert.  ?Psychiatric:     ?   Mood and Affect: Mood normal.  ? ? ?ED Results / Procedures / Treatments   ?Labs ?(all labs ordered are listed, but only abnormal results are displayed) ?Labs Reviewed  ?CBC WITH DIFFERENTIAL/PLATELET - Abnormal; Notable for the following components:  ?    Result Value  ? RBC 2.87 (*)   ? Hemoglobin 9.9 (*)   ?  HCT 29.5 (*)   ? MCV 102.8 (*)   ? MCH 34.5 (*)   ? RDW 16.4 (*)   ? Neutro Abs 9.6 (*)   ? Lymphs Abs 0.5 (*)   ? All other components within normal limits  ?COMPREHENSIVE METABOLIC PANEL - Abnormal; Notable for the following components:  ? Glucose, Bld 107 (*)   ? Calcium 8.6 (*)   ? Albumin 2.5 (*)   ? All other components within normal limits  ?LACTIC ACID, PLASMA - Abnormal; Notable for the following components:  ? Lactic Acid, Venous 2.5 (*)   ? All other components within normal limits  ?BRAIN NATRIURETIC PEPTIDE - Abnormal; Notable for the following components:  ? B Natriuretic Peptide 115.7 (*)   ? All other components within normal limits  ?CULTURE, BLOOD (ROUTINE X 2)  ?CULTURE, BLOOD (ROUTINE X 2)  ?LACTIC ACID, PLASMA  ?TROPONIN I (HIGH SENSITIVITY)  ?TROPONIN I (HIGH SENSITIVITY)  ? ? ?EKG ?None ? ?Radiology ?DG Chest 2 View ? ?Result Date: 08/10/2021 ?CLINICAL DATA:  Shortness of breath EXAM: CHEST - 2 VIEW COMPARISON:  07/30/2021 FINDINGS: Right-sided chest port remains in place. Volume loss with extensive pleuroparenchymal scarring at the right lung apex,  similar in appearance to the previous study. Chronic blunting of the right costophrenic angle, likely a component of scarring. Increased interstitial markings within the right lower lobe. Persistent small lef

## 2021-08-10 NOTE — Assessment & Plan Note (Signed)
PT/OT eval ?

## 2021-08-10 NOTE — Assessment & Plan Note (Signed)
LBM 3 to 4 days.  Likely from poor p.o. intake. ?-Scheduled MiraLAX and Senokot-S x2 followed by as needed ?

## 2021-08-10 NOTE — Assessment & Plan Note (Addendum)
CTA showed large left pleural effusion ?-Had thoracocentesis with removal of 1.7 L ?-Follow-up fluid studies. ?-Needs Pleurx cath? ?

## 2021-08-10 NOTE — Assessment & Plan Note (Signed)
Likely related to chemotherapy. ?-Continue IV fluid ?-Antiemetics with as needed Zofran and Compazine ?

## 2021-08-10 NOTE — Assessment & Plan Note (Signed)
Stable.  Continue home meds after med rec. ?

## 2021-08-10 NOTE — Assessment & Plan Note (Signed)
CODE STATUS discussed at bedside.  Patient prefers to be DNR/DNI which is appropriate. ?

## 2021-08-10 NOTE — Assessment & Plan Note (Signed)
Followed by Dr. Julien Nordmann.  On chemotherapy.  Had cycle #16 on 4/13. ?-Added oncology to treatment team ?

## 2021-08-10 NOTE — Assessment & Plan Note (Signed)
Recent Labs  ?  02/18/21 ?0840 03/11/21 ?3953 04/01/21 ?1042 04/22/21 ?1019 05/13/21 ?0851 06/03/21 ?1105 06/24/21 ?0911 07/15/21 ?1023 08/05/21 ?2023 08/10/21 ?1124  ?HGB 10.3* 10.0* 10.6* 10.4* 10.4* 10.5* 9.7* 9.4* 9.5* 9.9*  ?Continue monitoring ? ?

## 2021-08-10 NOTE — Assessment & Plan Note (Addendum)
Gout exacerbation. ?-Continue home nebulizers after med rec. ?

## 2021-08-10 NOTE — Procedures (Signed)
PROCEDURE SUMMARY: ? ?Successful image-guided left thoracentesis. ?Yielded 1.7L of hazy amber fluid. ?Pt tolerated procedure well. ?No immediate complications. ?EBL = trace  ? ?Specimen was sent for labs. ?CXR ordered. ? ?Please see imaging section of Epic for full dictation. ? ?Tera Mater PA-C ?08/10/2021 ?4:21 PM ? ? ? ?

## 2021-08-10 NOTE — Assessment & Plan Note (Addendum)
Multifactorial including lung cancer, pleural effusion and possible pneumonia.  He is physically deconditioned from chemotherapy and lung cancer as well.  He presents with progressive dyspnea, productive cough and chest pain.  He has nausea, dry heaving and abdominal pain as well.  PE ruled out by CTA.  Serial troponin and BNP are reassuring.   ?-Ordered IR thoracocentesis.  He had 1.7 L drained from left chest. ?-Continue IV cefepime for possible pneumonia ?-Add IV Flagyl for anaerobic coverage given emesis and risk for aspiration pneumonia ?-Discontinue vancomycin. ?-Follow cultures and pleural fluid studies ?-Incentive spirometry ?

## 2021-08-10 NOTE — Hospital Course (Signed)
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 ? ?Patient reports shortness of breath for 3 to 4 weeks that has gotten worse after his last chemo on 4/13.  Also reports productive cough with yellowish phlegm that has acutely gotten worse during the same.  He reports runny nose and scratchy throat which is usual for him after chemotherapy.  He also endorses nausea, dry heaving and abdominal pain as result.  He says he has not a bowel movement in 3 to 4 days.  He also endorses poor p.o. intake and poor appetite.  He also reports chronic chest pain that he attributes to coughing and dry heaving.  He denies exertional chest pain.  Chest pain is better with position at times.  He denies UTI symptoms.  He denies focal neuro symptoms but generalized weakness.  He denies fever or chills. ? ?Patient lives with his wife.  Ambulates independently but gets winded and weak easily.  Denies smoking cigarette, drinking alcohol recreational drug use.  He prefers to be DNR. ? ?In ED, tachycardic to 130s but improved to 100 after IV fluid and thoracocentesis.  Normal saturation but donned 2 L by [place].  Afebrile.  CMP without significant finding.  Hgb 9.9 (about baseline).  WBC 10.4.  Lactic acid 2.5.  BNP 115.  Troponin 6> 7.  CT angio chest PE protocol with large left pleural effusion and RLL infiltrate.  Cultures obtained.  Received IV fluid boluses 500 cc x 2, vancomycin and cefepime and hospitalist service called for admission ? ? ?

## 2021-08-11 ENCOUNTER — Encounter (HOSPITAL_COMMUNITY): Payer: Self-pay | Admitting: Student

## 2021-08-11 DIAGNOSIS — E872 Acidosis, unspecified: Secondary | ICD-10-CM | POA: Diagnosis present

## 2021-08-11 DIAGNOSIS — K219 Gastro-esophageal reflux disease without esophagitis: Secondary | ICD-10-CM | POA: Diagnosis present

## 2021-08-11 DIAGNOSIS — E860 Dehydration: Secondary | ICD-10-CM | POA: Diagnosis present

## 2021-08-11 DIAGNOSIS — E871 Hypo-osmolality and hyponatremia: Secondary | ICD-10-CM | POA: Diagnosis present

## 2021-08-11 DIAGNOSIS — J449 Chronic obstructive pulmonary disease, unspecified: Secondary | ICD-10-CM | POA: Diagnosis not present

## 2021-08-11 DIAGNOSIS — J189 Pneumonia, unspecified organism: Secondary | ICD-10-CM

## 2021-08-11 DIAGNOSIS — Z66 Do not resuscitate: Secondary | ICD-10-CM | POA: Diagnosis present

## 2021-08-11 DIAGNOSIS — D696 Thrombocytopenia, unspecified: Secondary | ICD-10-CM | POA: Diagnosis present

## 2021-08-11 DIAGNOSIS — J156 Pneumonia due to other aerobic Gram-negative bacteria: Secondary | ICD-10-CM | POA: Diagnosis present

## 2021-08-11 DIAGNOSIS — J910 Malignant pleural effusion: Secondary | ICD-10-CM | POA: Diagnosis present

## 2021-08-11 DIAGNOSIS — R638 Other symptoms and signs concerning food and fluid intake: Secondary | ICD-10-CM | POA: Diagnosis not present

## 2021-08-11 DIAGNOSIS — T451X5A Adverse effect of antineoplastic and immunosuppressive drugs, initial encounter: Secondary | ICD-10-CM | POA: Diagnosis present

## 2021-08-11 DIAGNOSIS — E663 Overweight: Secondary | ICD-10-CM | POA: Diagnosis present

## 2021-08-11 DIAGNOSIS — R0602 Shortness of breath: Secondary | ICD-10-CM | POA: Diagnosis present

## 2021-08-11 DIAGNOSIS — Z515 Encounter for palliative care: Secondary | ICD-10-CM | POA: Diagnosis not present

## 2021-08-11 DIAGNOSIS — J441 Chronic obstructive pulmonary disease with (acute) exacerbation: Secondary | ICD-10-CM | POA: Diagnosis present

## 2021-08-11 DIAGNOSIS — D72819 Decreased white blood cell count, unspecified: Secondary | ICD-10-CM | POA: Diagnosis present

## 2021-08-11 DIAGNOSIS — D638 Anemia in other chronic diseases classified elsewhere: Secondary | ICD-10-CM | POA: Diagnosis not present

## 2021-08-11 DIAGNOSIS — C7931 Secondary malignant neoplasm of brain: Secondary | ICD-10-CM | POA: Diagnosis present

## 2021-08-11 DIAGNOSIS — I10 Essential (primary) hypertension: Secondary | ICD-10-CM | POA: Diagnosis present

## 2021-08-11 DIAGNOSIS — Z880 Allergy status to penicillin: Secondary | ICD-10-CM | POA: Diagnosis not present

## 2021-08-11 DIAGNOSIS — K5900 Constipation, unspecified: Secondary | ICD-10-CM | POA: Diagnosis present

## 2021-08-11 DIAGNOSIS — F411 Generalized anxiety disorder: Secondary | ICD-10-CM | POA: Diagnosis present

## 2021-08-11 DIAGNOSIS — R110 Nausea: Secondary | ICD-10-CM | POA: Diagnosis present

## 2021-08-11 DIAGNOSIS — D630 Anemia in neoplastic disease: Secondary | ICD-10-CM | POA: Diagnosis present

## 2021-08-11 DIAGNOSIS — J440 Chronic obstructive pulmonary disease with acute lower respiratory infection: Secondary | ICD-10-CM | POA: Diagnosis present

## 2021-08-11 DIAGNOSIS — Z6826 Body mass index (BMI) 26.0-26.9, adult: Secondary | ICD-10-CM | POA: Diagnosis not present

## 2021-08-11 DIAGNOSIS — C3491 Malignant neoplasm of unspecified part of right bronchus or lung: Secondary | ICD-10-CM | POA: Diagnosis present

## 2021-08-11 LAB — COMPREHENSIVE METABOLIC PANEL
ALT: 12 U/L (ref 0–44)
AST: 25 U/L (ref 15–41)
Albumin: 2.2 g/dL — ABNORMAL LOW (ref 3.5–5.0)
Alkaline Phosphatase: 91 U/L (ref 38–126)
Anion gap: 7 (ref 5–15)
BUN: 12 mg/dL (ref 6–20)
CO2: 22 mmol/L (ref 22–32)
Calcium: 7.9 mg/dL — ABNORMAL LOW (ref 8.9–10.3)
Chloride: 104 mmol/L (ref 98–111)
Creatinine, Ser: 0.92 mg/dL (ref 0.61–1.24)
GFR, Estimated: >60 mL/min (ref >60)
Glucose, Bld: 103 mg/dL — ABNORMAL HIGH (ref 70–99)
Potassium: 3.5 mmol/L (ref 3.5–5.1)
Sodium: 133 mmol/L — ABNORMAL LOW (ref 135–145)
Total Bilirubin: 1.3 mg/dL — ABNORMAL HIGH (ref 0.3–1.2)
Total Protein: 5.3 g/dL — ABNORMAL LOW (ref 6.5–8.1)

## 2021-08-11 LAB — MAGNESIUM: Magnesium: 1.5 mg/dL — ABNORMAL LOW (ref 1.7–2.4)

## 2021-08-11 LAB — CBC
HCT: 22.2 % — ABNORMAL LOW (ref 39.0–52.0)
Hemoglobin: 7.4 g/dL — ABNORMAL LOW (ref 13.0–17.0)
MCH: 34.7 pg — ABNORMAL HIGH (ref 26.0–34.0)
MCHC: 33.3 g/dL (ref 30.0–36.0)
MCV: 104.2 fL — ABNORMAL HIGH (ref 80.0–100.0)
Platelets: 132 10*3/uL — ABNORMAL LOW (ref 150–400)
RBC: 2.13 MIL/uL — ABNORMAL LOW (ref 4.22–5.81)
RDW: 16.2 % — ABNORMAL HIGH (ref 11.5–15.5)
WBC: 6.3 10*3/uL (ref 4.0–10.5)
nRBC: 0 % (ref 0.0–0.2)

## 2021-08-11 LAB — LACTIC ACID, PLASMA
Lactic Acid, Venous: 0.7 mmol/L (ref 0.5–1.9)
Lactic Acid, Venous: 1.1 mmol/L (ref 0.5–1.9)

## 2021-08-11 LAB — PREALBUMIN: Prealbumin: 6.2 mg/dL — ABNORMAL LOW (ref 18–38)

## 2021-08-11 LAB — PHOSPHORUS: Phosphorus: 2.6 mg/dL (ref 2.5–4.6)

## 2021-08-11 MED ORDER — BUDESONIDE 0.5 MG/2ML IN SUSP
0.5000 mg | Freq: Two times a day (BID) | RESPIRATORY_TRACT | Status: DC
  Administered 2021-08-11 – 2021-08-12 (×2): 0.5 mg via RESPIRATORY_TRACT
  Filled 2021-08-11 (×2): qty 2

## 2021-08-11 MED ORDER — MAGNESIUM SULFATE 2 GM/50ML IV SOLN
2.0000 g | Freq: Once | INTRAVENOUS | Status: AC
  Administered 2021-08-11: 2 g via INTRAVENOUS
  Filled 2021-08-11: qty 50

## 2021-08-11 MED ORDER — ALBUTEROL SULFATE (2.5 MG/3ML) 0.083% IN NEBU: 2.5000 mg | INHALATION_SOLUTION | RESPIRATORY_TRACT | Status: DC | PRN

## 2021-08-11 MED ORDER — ENSURE ENLIVE PO LIQD
237.0000 mL | Freq: Two times a day (BID) | ORAL | Status: DC
  Administered 2021-08-11: 237 mL via ORAL

## 2021-08-11 MED ORDER — IPRATROPIUM-ALBUTEROL 0.5-2.5 (3) MG/3ML IN SOLN
3.0000 mL | Freq: Four times a day (QID) | RESPIRATORY_TRACT | Status: DC
  Administered 2021-08-11: 3 mL via RESPIRATORY_TRACT
  Filled 2021-08-11: qty 3

## 2021-08-11 MED ORDER — FOLIC ACID 1 MG PO TABS
1.0000 mg | ORAL_TABLET | Freq: Every day | ORAL | Status: DC
  Administered 2021-08-12: 1 mg via ORAL
  Filled 2021-08-11: qty 1

## 2021-08-11 MED ORDER — SODIUM CHLORIDE 0.9 % IV SOLN
2.0000 g | Freq: Three times a day (TID) | INTRAVENOUS | Status: DC
  Administered 2021-08-11 – 2021-08-12 (×4): 2 g via INTRAVENOUS
  Filled 2021-08-11 (×5): qty 12.5

## 2021-08-11 MED ORDER — DM-GUAIFENESIN ER 30-600 MG PO TB12
1.0000 | ORAL_TABLET | Freq: Two times a day (BID) | ORAL | Status: DC
  Administered 2021-08-11 – 2021-08-12 (×2): 1 via ORAL
  Filled 2021-08-11 (×2): qty 1

## 2021-08-11 MED ORDER — ADULT MULTIVITAMIN W/MINERALS CH
1.0000 | ORAL_TABLET | Freq: Every day | ORAL | Status: DC
  Administered 2021-08-11 – 2021-08-12 (×2): 1 via ORAL
  Filled 2021-08-11 (×2): qty 1

## 2021-08-11 MED ORDER — ACETAMINOPHEN 325 MG PO TABS: 325.0000 mg | ORAL_TABLET | Freq: Four times a day (QID) | ORAL | Status: DC | PRN

## 2021-08-11 MED ORDER — OXYCODONE-ACETAMINOPHEN 5-325 MG PO TABS
1.0000 | ORAL_TABLET | Freq: Four times a day (QID) | ORAL | Status: DC | PRN
  Administered 2021-08-11: 1 via ORAL
  Filled 2021-08-11 (×2): qty 1

## 2021-08-11 MED ORDER — SODIUM CHLORIDE 0.9% FLUSH: 10.0000 mL | INTRAVENOUS | Status: DC | PRN

## 2021-08-11 MED ORDER — CHLORHEXIDINE GLUCONATE CLOTH 2 % EX PADS: 6.0000 | MEDICATED_PAD | Freq: Every day | CUTANEOUS | Status: DC

## 2021-08-11 MED ORDER — LORAZEPAM 0.5 MG PO TABS
0.5000 mg | ORAL_TABLET | Freq: Three times a day (TID) | ORAL | Status: DC | PRN
  Administered 2021-08-11: 0.5 mg via ORAL
  Filled 2021-08-11: qty 1

## 2021-08-11 MED ORDER — IPRATROPIUM-ALBUTEROL 0.5-2.5 (3) MG/3ML IN SOLN
3.0000 mL | Freq: Three times a day (TID) | RESPIRATORY_TRACT | Status: DC
  Administered 2021-08-12: 3 mL via RESPIRATORY_TRACT
  Filled 2021-08-11: qty 3

## 2021-08-11 MED ORDER — ACETAMINOPHEN 650 MG RE SUPP: 650.0000 mg | Freq: Four times a day (QID) | RECTAL | Status: DC | PRN

## 2021-08-11 MED ORDER — METHYLPREDNISOLONE SODIUM SUCC 40 MG IJ SOLR
40.0000 mg | Freq: Two times a day (BID) | INTRAMUSCULAR | Status: DC
  Administered 2021-08-11 – 2021-08-12 (×2): 40 mg via INTRAVENOUS
  Filled 2021-08-11 (×2): qty 1

## 2021-08-11 NOTE — Progress Notes (Signed)
PHARMACY NOTE:  ANTIMICROBIAL RENAL DOSAGE ADJUSTMENT ? ?Current antimicrobial regimen includes a mismatch between antimicrobial dosage and estimated renal function.  As per policy approved by the Pharmacy & Therapeutics and Medical Executive Committees, the antimicrobial dosage will be adjusted accordingly. ? ?Current antimicrobial dosage:  Cefepime 2gm IV q12h ? ?Indication: aspiration pneumonia vs CAP ? ?Renal Function: ? ?Estimated Creatinine Clearance: 78.6 mL/min (by C-G formula based on SCr of 1 mg/dL). ?  ?   ?Antimicrobial dosage has been changed to:  Cefepime 2gm IV q8h ? ? ?Thank you for allowing pharmacy to be a part of this patient's care. ? ?Everette Rank, RPH ?08/11/2021 3:34 AM ?

## 2021-08-11 NOTE — Progress Notes (Signed)
PT Cancellation Note ? ?Patient Details ?Name: Richard Li ?MRN: [id number] ?DOB: [birth year] ? ? ?Cancelled Treatment:    Reason Eval/Treat Not Completed:  Attempted PT eval-pt declined to participate at this time. Will check back as schedule allows. ? ? ? ?Doreatha Massed, PT ?Acute Rehabilitation  ?Office: [phone] ?Pager: [phone] ? ?  ?

## 2021-08-11 NOTE — Progress Notes (Signed)
PIV consult: Pt with R chest port. Site unremarkable. Pt would rather use implanted port for venous access since last PIV failed. Port accessed. ?

## 2021-08-11 NOTE — Progress Notes (Signed)
Initial Nutrition Assessment ? ?INTERVENTION:  ? ?-Ensure Plus High Protein po BID, each supplement provides 350 kcal and 20 grams of protein.  ? ?-Multivitamin with minerals daily ? ?NUTRITION DIAGNOSIS:  ? ?Increased nutrient needs related to cancer and cancer related treatments as evidenced by estimated needs. ? ?GOAL:  ? ?Patient will meet greater than or equal to 90% of their needs ? ?MONITOR:  ? ?PO intake, Supplement acceptance, Labs, Weight trends, I & O's ? ?REASON FOR ASSESSMENT:  ? ?Consult ?Assessment of nutrition requirement/status ? ?ASSESSMENT:  ? 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 ? ?Upon entering pt's room, pt requested that RD return at a later time. Pt gave no specific reason.  ? ?Per chart review, pt previously followed by [facility], last seen in June 2022. At that time pt was eating better, supplementing with Ensure or Equate shakes.  ? ?Pt now having 3-4 weeks of worsening SOB especially following last chemotherapy on 4/13. ?Will order Ensure given recent poor PO r/t nausea and abdominal pain.  ? ?Per weight records, pt has lost 17 lbs since 05/24/21 (8% wt loss x 2.5 months, significant for time frame).   ? ?Medications: Miralax, Senokot, IV Mg sulfate, IV Zofran ? ?Labs reviewed: ? Low Na, Mg ? ?NUTRITION - FOCUSED PHYSICAL EXAM: ? ?Unable to complete, will attempt at follow-up ? ?Diet Order:   ?Diet Order   ? ?       ?  Diet regular Room service appropriate? Yes; Fluid consistency: Thin  Diet effective now       ?  ? ?  ?  ? ?  ? ? ?EDUCATION NEEDS:  ? ?No education needs have been identified at this time ? ?Skin:  Skin Assessment: Reviewed RN Assessment ? ?Last BM:  4/14 ? ?Height:  ? ?Ht Readings from Last 1 Encounters:  ?08/11/21 5\' 9"  (1.753 m)  ? ? ?Weight:  ? ?Wt Readings from Last 1 Encounters:  ?08/11/21 82.2 kg  ? ? ?BMI:  Body mass  index is 26.76 kg/m?. ? ?Estimated Nutritional Needs:  ? ?Kcal:  2200-2400 ? ?Protein:  110-120g ? ?Fluid:  2.2L/day ? ?Clayton Bibles, MS, RD, LDN ?Inpatient Clinical Dietitian ?Contact information available via Amion ? ?

## 2021-08-11 NOTE — Progress Notes (Signed)
?PROGRESS NOTE ? ? ? ?Richard Li  XIP:[id number] DOB: 04-22-1961 DOA: 08/10/2021 ?PCP: [name], DO  ? 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  On presentation, he was tachycardic.  CT angio chest was negative for PE but showed large left pleural effusion and right lower lobe infiltrate.  He underwent ultrasound-guided thoracentesis by IR with removal of 1.7 L fluid.  He was started on broad-spectrum antibiotics. ? ?Assessment & Plan: ?  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 ?-underwent ultrasound-guided thoracentesis by IR with removal of 1.7 L fluid.  Follow-up pleural fluid analysis ?-Continue cefepime.  DC Flagyl.  Follow cultures. ?-Currently on room air. ? ?Stage IV right lung adenocarcinoma ?Recurrent left pleural effusion ?-Follows up with Dr. Julien Nordmann.  Added oncology to treatment team.  Currently undergoing chemotherapy ?-If patient continues to have recurrent pleural effusion, he might need Pleurx catheter placement.  Will follow oncology input ? ?Hypomagnesemia ?-Replace.  Repeat a.m. labs ? ?Nausea and dry heaving ?Poor oral intake ?-Possibly from chemotherapy.  Continue IV fluids and antiemetics as needed ?-Follow dietitian recommendations ? ?Generalized weakness ?-PT/OT eval ? ?Thrombocytopenia ?-No signs of bleeding.  Monitor ? ?Hyponatremia ?-Mild.  Monitor.  Encourage oral intake. ? ?Lactic acidosis ?-Possibly from dehydration/poor intake ?-Resolved ? ?Constipation ?-Continue laxatives ? ?Goals of care ?-Palliative care consultation for goals of care discussion.  Currently  DNR ? ?Anemia of chronic disease ?-From cancer.  Hemoglobin stable ? ?Essential hypertension ?-Blood pressure stable.  Monitor ? ?COPD ?-Currently stable.  Continue home regimen ? ?GAD ?-Stable.  Outpatient follow-up ? ? ?DVT prophylaxis: Lovenox ?Code Status: DNR ?Family Communication: Wife at bedside ?Disposition Plan: ?Status is: Observation ?The patient will require care spanning > 2 midnights and should be moved to inpatient because: Of severity of illness.  Need for IV antibiotics ? ? ? ?Consultants: Palliative care.  Added oncology to care teams ? ?Procedures: None ? ?Antimicrobials:  ?Anti-infectives (From admission, onward)  ? ? Start     Dose/Rate Route Frequency Ordered Stop  ? 08/11/21 0600  ceFEPIme (MAXIPIME) 2 g in sodium chloride 0.9 % 100 mL IVPB       ? 2 g ?200 mL/hr over 30 Minutes Intravenous Every 8 hours 08/11/21 0334    ? 08/10/21 1600  metroNIDAZOLE (FLAGYL) IVPB 500 mg  Status:  Discontinued       ? 500 mg ?100 mL/hr over 60 Minutes Intravenous Every 12 hours 08/10/21 1526 08/11/21 0757  ? 08/10/21 1500  vancomycin (VANCOREADY) IVPB 1750 mg/350 mL       ? 1,750 mg ?175 mL/hr over 120 Minutes Intravenous  Once 08/10/21 1446 08/10/21 2026  ? 08/10/21 1445  ceFEPIme (MAXIPIME) 2 g in sodium chloride 0.9 % 100 mL IVPB  Status:  Discontinued       ? 2 g ?200 mL/hr over 30 Minutes Intravenous Every 12 hours 08/10/21 1431 08/11/21 0333  ? ?  ? ? ? ?Subjective: ?Patient seen and examined at bedside.  Complains of intermittent nausea with shortness of breath.  Feels slightly better.  No overnight fever or vomiting reported.  Denies any chest pain. ? ?Objective: ?Vitals:  ? 08/11/21 0302 08/11/21  0304 08/11/21 0614 08/11/21 1118  ?BP:  127/63 120/86 128/62  ?Pulse:  100 96 96  ?Resp:   18 18  ?Temp:  98.9 ?F (37.2 ?C) 98.3 ?F (36.8 ?C) 99.6 ?F (37.6 ?C)  ?TempSrc:  Oral Oral Oral  ?SpO2:  99% 99% 99%  ?Weight: 82.2 kg     ?Height: 5\' 9"  (1.753 m)     ? ? ?Intake/Output Summary (Last 24 hours) at  08/11/2021 1149 ?Last data filed at 08/11/2021 0400 ?Gross per 24 hour  ?Intake 1924.36 ml  ?Output --  ?Net 1924.36 ml  ? ?Filed Weights  ? 08/10/21 0943 08/11/21 0302  ?Weight: 80.7 kg 82.2 kg  ? ? ?Examination: ? ?General exam: Appears calm and comfortable.  Currently on room air.  Looks chronically ill and deconditioned. ?Respiratory system: Bilateral decreased breath sounds at bases with some scattered crackles ?Cardiovascular system: S1 & S2 heard, Rate controlled ?Gastrointestinal system: Abdomen is nondistended, soft and nontender. Normal bowel sounds heard. ?Extremities: No cyanosis, clubbing, edema  ?Central nervous system: Alert and oriented. No focal neurological deficits. Moving extremities ?Skin: No rashes, lesions or ulcers ?Psychiatry: Judgement and insight appear normal. Mood & affect appropriate.  ? ? ? ?Data Reviewed: I have personally reviewed following labs and imaging studies ? ?CBC: ?Recent Labs  ?Lab 08/05/21 ?0951 08/10/21 ?1124 08/11/21 ?8916  ?WBC 8.9 10.4 6.3  ?NEUTROABS 7.3 9.6*  --   ?HGB 9.5* 9.9* 7.4*  ?HCT 28.4* 29.5* 22.2*  ?MCV 101.8* 102.8* 104.2*  ?PLT 365 272 132*  ? ?Basic Metabolic Panel: ?Recent Labs  ?Lab 08/05/21 ?0951 08/10/21 ?1124 08/11/21 ?9450  ?NA 139 136 133*  ?K 3.1* 4.2 3.5  ?CL 105 102 104  ?CO2 25 25 22   ?GLUCOSE 85 107* 103*  ?BUN 9 13 12   ?CREATININE 0.96 1.00 0.92  ?CALCIUM 8.9 8.6* 7.9*  ?MG  --   --  1.5*  ?PHOS  --   --  2.6  ? ?GFR: ?Estimated Creatinine Clearance: 85.4 mL/min (by C-G formula based on SCr of 0.92 mg/dL). ?Liver Function Tests: ?Recent Labs  ?Lab 08/05/21 ?0951 08/10/21 ?1124 08/11/21 ?3888  ?AST 19 30 25   ?ALT 8 12 12   ?ALKPHOS 113 108 91  ?BILITOT 0.5 1.0 1.3*  ?PROT 6.3* 6.5 5.3*  ?ALBUMIN 2.9* 2.5* 2.2*  ? ?No results for input(s): LIPASE, AMYLASE in the last 168 hours. ?No results for input(s): AMMONIA in the last 168 hours. ?Coagulation Profile: ?No results for input(s): INR, PROTIME in the last 168 hours. ?Cardiac Enzymes: ?No results  for input(s): CKTOTAL, CKMB, CKMBINDEX, TROPONINI in the last 168 hours. ?BNP (last 3 results) ?No results for input(s): PROBNP in the last 8760 hours. ?HbA1C: ?No results for input(s): HGBA1C in the last 72 hours. ?CBG: ?No results for input(s): GLUCAP in the last 168 hours. ?Lipid Profile: ?No results for input(s): CHOL, HDL, LDLCALC, TRIG, CHOLHDL, LDLDIRECT in the last 72 hours. ?Thyroid Function Tests: ?No results for input(s): TSH, T4TOTAL, FREET4, T3FREE, THYROIDAB in the last 72 hours. ?Anemia Panel: ?No results for input(s): VITAMINB12, FOLATE, FERRITIN, TIBC, IRON, RETICCTPCT in the last 72 hours. ?Sepsis Labs: ?Recent Labs  ?Lab 08/10/21 ?1124 08/11/21 ?2800 08/11/21 ?3491  ?LATICACIDVEN 2.5* 0.7 1.1  ? ? ?Recent Results (from the past 240 hour(s))  ?Gram stain     Status: None  ? Collection Time: 08/10/21  9:34 AM  ? Specimen: Pleura  ?Result Value Ref Range Status  ? Specimen Description PLEURAL  Final  ? Special Requests NONE  Final  ? Gram Stain   Final  ?  FEW WBC PRESENT, PREDOMINANTLY PMN ?NO ORGANISMS SEEN ?Performed at [facility], [place] [address], [place], [place] [address] ?  ? Report Status 08/10/2021 FINAL  Final  ?Blood culture (routine x 2)     Status: None (Preliminary result)  ? Collection Time: 08/10/21  3:10 PM  ? Specimen: BLOOD  ?Result Value Ref Range Status  ? Specimen Description   Final  ?  BLOOD ?Performed at [facility], [place] [address] [place], [place] [address] ?  ? Special Requests   Final  ?  BOTTLES DRAWN AEROBIC AND ANAEROBIC Blood Culture adequate volume ?Performed at [facility], [place] [address], [place], [place] [address] ?  ? Culture   Final  ?  NO GROWTH < 12 HOURS ?Performed at [facility], [place] [address] [place], [place] [address] ?  ? Report Status PENDING  Incomplete  ?Blood culture (routine x 2)     Status: None (Preliminary result)  ? Collection Time: 08/10/21  3:10 PM  ? Specimen: BLOOD  ?Result Value Ref  Range Status  ? Specimen Description   Final  ?  BLOOD ?Performed at [facility], [place] [address] [place], [place] [address] ?  ? Special Requests   Final  ?  BOTTLES DRAWN AEROBIC AND ANAEROBIC [place]

## 2021-08-11 NOTE — ED Notes (Signed)
Nurse from floor messaged this nurse that room was now ready. Patient being transported upstairs to room 1412. JRPRN ?

## 2021-08-11 NOTE — Evaluation (Signed)
Occupational Therapy Evaluation ?Patient Details ?Name: Richard Li ?MRN: [id number] ?DOB: January 15, 1961 ?Today's Date: 08/11/2021 ? ? 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  ? ?Clinical Impression ?  ?[name] is a 61 year old man who presents with generalized weakness and decreased activity tolerance but is able to perform in room ambulation and ADLs without physical assistance. He is plagued by a persistent cough during evaluation. His o2 sat seated was 92% and 95% after short ambulation on room air. Patient reports feeling weak for two weeks, has assistance of wife at home and uses a shower chair for bathing. From a functional standpoint - patient has no acute care OT needs. Therapist encouraged patient to be out of bed with meals and ambulating to improve strength and endurance. Patient reports ambulating to the bathroom independently.  ?   ? ?Recommendations for follow up therapy are one component of a multi-disciplinary discharge planning process, led by the attending physician.  Recommendations may be updated based on patient status, additional functional criteria and insurance authorization.  ? ?Follow Up Recommendations ? No OT follow up  ?  ?Assistance Recommended at Discharge Intermittent Supervision/Assistance  ?Patient can return home with the following Assistance with cooking/housework;Direct supervision/assist for financial management;Direct supervision/assist for medications management;Help with stairs or ramp for entrance ? ?  ?Functional Status Assessment ? Patient has not had a recent decline in their functional status  ?Equipment Recommendations ? None recommended by OT  ?  ?Recommendations for Other Services   ? ? ?  ?Precautions / Restrictions Precautions ?Precautions: Fall ?Restrictions ?Weight Bearing Restrictions: No   ? ?  ? ?Mobility Bed Mobility ?Overal bed mobility: Modified Independent ?  ?  ?  ?  ?  ?  ?General bed mobility comments: increased time ?  ? ?Transfers ?  ?  ?  ?  ?  ?  ?  ?  ?  ?General transfer comment: Supervision to ambulate in room and then to recliner. o2 sat 92% seated and 95% after short ambulation on room air. Persistent coughing during activity. ?  ? ?  ?Balance Overall balance assessment: No apparent balance deficits (not formally assessed) ?  ?  ?  ?  ?  ?  ?  ?  ?  ?  ?  ?  ?  ?  ?  ?  ?  ?  ?   ? ?ADL either performed or assessed with clinical judgement  ? ?ADL Overall ADL's : Modified independent ?  ?  ?  ?  ?  ?  ?  ?  ?  ?  ?  ?  ?  ?  ?  ?  ?  ?  ?  ?General ADL Comments: Increased time due to weakness but independent.  ? ? ? ?Vision Patient Visual Report: No change from baseline ?   ?   ?Perception   ?  ?Praxis   ?  ? ?Pertinent Vitals/Pain Pain Assessment ?Pain Assessment: No/denies pain  ? ? ? ?Hand Dominance Right ?  ?Extremity/Trunk Assessment Upper Extremity Assessment ?Upper Extremity Assessment: RUE deficits/detail;LUE deficits/detail ?RUE Deficits / Details: Shoulder ROm approx 90 degrees, otherwise normal. Strength Grossly 4-/5 strength ?RUE Sensation: WNL ?RUE Coordination: WNL ?LUE Deficits / Details: Shoulder ROM approx 100 degrees, otherwise normal, strength grossly 4-/5 ?LUE Coordination: WNL ?  ?  Lower Extremity Assessment ?Lower Extremity Assessment: Defer to PT evaluation ?  ?Cervical / Trunk Assessment ?Cervical / Trunk Assessment: Normal ?  ?Communication Communication ?Communication: No difficulties ?  ?Cognition Arousal/Alertness: Awake/alert ?Behavior During Therapy: [facility] for tasks assessed/performed ?Overall Cognitive Status: Within Functional Limits for tasks assessed ?  ?  ?  ?  ?  ?  ?  ?  ?  ?  ?  ?  ?  ?  ?  ?  ?  ?  ?  ?General Comments    ? ?  ?Exercises   ?  ?Shoulder Instructions    ? ? ?Home Living Family/patient expects to be discharged to:: Private  residence ?Living Arrangements: Spouse/significant other ?Available Help at Discharge: Family;Available PRN/intermittently ?Type of Home: House ?Home Access: Stairs to enter ?Entrance Stairs-Number of Steps: 5 ?Entrance Stairs-Rails: Right ?Home Layout: One level ?  ?  ?Bathroom Shower/Tub: Tub/shower unit;Walk-in shower ?  ?Bathroom Toilet: Standard ?  ?  ?Home Equipment: Shower seat - built in;Hand held shower head ?  ?  ?  ? ?  ?Prior Functioning/Environment Prior Level of Function : Independent/Modified Independent ?  ?  ?  ?  ?  ?  ?Mobility Comments: no device ?  ?  ? ?  ?  ?OT Problem List:   ?  ?   ?OT Treatment/Interventions:    ?  ?OT Goals(Current goals can be found in the care plan section) Acute Rehab OT Goals ?OT Goal Formulation: All assessment and education complete, DC therapy  ?OT Frequency:   ?  ? ?Co-evaluation   ?  ?  ?  ?  ? ?  ?AM-PAC OT "6 Clicks" Daily Activity     ?Outcome Measure Help from another person eating meals?: None ?Help from another person taking care of personal grooming?: None ?Help from another person toileting, which includes using toliet, bedpan, or urinal?: None ?Help from another person bathing (including washing, rinsing, drying)?: None ?Help from another person to put on and taking off regular upper body clothing?: None ?Help from another person to put on and taking off regular lower body clothing?: None ?6 Click Score: 24 ?  ?End of Session Nurse Communication: Mobility status ? ?Activity Tolerance: Patient tolerated treatment well ?Patient left: in chair;with call bell/phone within reach;with family/visitor present ? ?OT Visit Diagnosis: Muscle weakness (generalized) (M62.81)  ?              ?Time: 7035-0093 ?OT Time Calculation (min): 11 min ?Charges:  OT General Charges ?$OT Visit: 1 Visit ?OT Evaluation ?$OT Eval Low Complexity: 1 Low ? ?Richard Li, OTR/Richard ?Acute Care Rehab Services  ?Office [phone] ?Pager: [phone]  ? ?Richard Li Richard Li ?08/11/2021, 9:02 AM ?

## 2021-08-11 NOTE — Evaluation (Signed)
Physical Therapy Evaluation ?Patient Details ?Name: Richard Li ?MRN: [id number] ?DOB: April 19, 1961 ?Today's Date: 08/11/2021 ? ?History of Present Illness ? 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  ?Clinical Impression ? On eval, pt was Supv for mobility. He walked ~70 feet with a rollator (only used in case he needed to sit urgently-he is able to mobilize without a device). O2 90% on RA, HR 130 bpm, dyspnea 3/4 with ambulation. Will plan to follow pt during hospital stay-will progress activity as tolerated. No f/u PT needs at this time. ?   ? ?Recommendations for follow up therapy are one component of a multi-disciplinary discharge planning process, led by the attending physician.  Recommendations may be updated based on patient status, additional functional criteria and insurance authorization. ? ?Follow Up Recommendations No PT follow up (pt does not wish to have any f/u PT) ? ?  ?Assistance Recommended at Discharge PRN  ?Patient can return home with the following ? Assistance with cooking/housework;Assist for transportation ? ?  ?Equipment Recommendations None recommended by PT  ?Recommendations for Other Services ?    ?  ?Functional Status Assessment Patient has had a recent decline in their functional status and demonstrates the ability to make significant improvements in function in a reasonable and predictable amount of time.  ? ?  ?Precautions / Restrictions Precautions ?Precautions: Fall ?Restrictions ?Weight Bearing Restrictions: No  ? ?  ? ?Mobility ? Bed Mobility ?Overal bed mobility: Modified Independent ?  ?  ?  ?  ?  ?  ?General bed mobility comments: increased time ?  ? ?Transfers ?Overall transfer level: Needs assistance ?  ?Transfers: Sit to/from Stand ?Sit to Stand: Supervision ?  ?  ?  ?  ?  ?  ?  ? ?Ambulation/Gait ?Ambulation/Gait assistance:  Supervision ?Gait Distance (Feet): 70 Feet ?Assistive device: Rollator (4 wheels) ?Gait Pattern/deviations: Step-through pattern, Decreased stride length ?  ?  ?  ?General Gait Details: Used rollator in case he fatigued and needed to sit down urgently. He did not rely on it too much. O2 90% on RA, HR 130 bpm, dyspnea 3/4 ? ?Stairs ?  ?  ?  ?  ?  ? ?Wheelchair Mobility ?  ? ?Modified Rankin (Stroke Patients Only) ?  ? ?  ? ?Balance Overall balance assessment: No apparent balance deficits (not formally assessed) ?  ?  ?  ?  ?  ?  ?  ?  ?  ?  ?  ?  ?  ?  ?  ?  ?  ?  ?   ? ? ? ?Pertinent Vitals/Pain Pain Assessment ?Pain Assessment: No/denies pain  ? ? ?Home Living Family/patient expects to be discharged to:: Private residence ?Living Arrangements: Spouse/significant other ?Available Help at Discharge: Family;Available PRN/intermittently ?Type of Home: House ?Home Access: Stairs to enter ?Entrance Stairs-Rails: Right ?Entrance Stairs-Number of Steps: 5 ?  ?Home Layout: One level ?Home Equipment: Shower seat - built in;Hand held shower head ?   ?  ?Prior Function Prior Level of Function : Independent/Modified Independent ?  ?  ?  ?  ?  ?  ?Mobility Comments: no device ?  ?  ? ? ?Hand Dominance  ? Dominant Hand: Right ? ?  ?Extremity/Trunk Assessment  ? Upper Extremity Assessment ?Upper Extremity Assessment: Defer to OT evaluation ?  ? ?Lower Extremity Assessment ?Lower Extremity Assessment:  Generalized weakness ?  ? ?Cervical / Trunk Assessment ?Cervical / Trunk Assessment: Normal  ?Communication  ? Communication: No difficulties  ?Cognition Arousal/Alertness: Awake/alert ?Behavior During Therapy: [facility] for tasks assessed/performed ?Overall Cognitive Status: Within Functional Limits for tasks assessed ?  ?  ?  ?  ?  ?  ?  ?  ?  ?  ?  ?  ?  ?  ?  ?  ?  ?  ?  ? ?  ?General Comments   ? ?  ?Exercises    ? ?Assessment/Plan  ?  ?PT Assessment Patient needs continued PT services  ?PT Problem List Decreased activity  tolerance;Decreased mobility;Decreased strength ? ?   ?  ?PT Treatment Interventions Gait training;Therapeutic activities;Therapeutic exercise;Patient/family education;Functional mobility training   ? ?PT Goals (Current goals can be found in the Care Plan section)  ?Acute Rehab PT Goals ?Patient Stated Goal: to get strength and endurance back ?PT Goal Formulation: With patient ?Time For Goal Achievement: 08/25/21 ?Potential to Achieve Goals: Good ? ?  ?Frequency Min 3X/week ?  ? ? ?Co-evaluation   ?  ?  ?  ?  ? ? ?  ?AM-PAC PT "6 Clicks" Mobility  ?Outcome Measure Help needed turning from your back to your side while in a flat bed without using bedrails?: None ?Help needed moving from lying on your back to sitting on the side of a flat bed without using bedrails?: None ?Help needed moving to and from a bed to a chair (including a wheelchair)?: A Little ?Help needed standing up from a chair using your arms (e.g., wheelchair or bedside chair)?: A Little ?Help needed to walk in hospital room?: A Little ?Help needed climbing 3-5 steps with a railing? : A Little ?6 Click Score: 20 ? ?  ?End of Session   ?Activity Tolerance: Patient limited by fatigue ?Patient left: in bed;with call bell/phone within reach;with family/visitor present ?  ?PT Visit Diagnosis: Muscle weakness (generalized) (M62.81);Difficulty in walking, not elsewhere classified (R26.2) ?  ? ?Time: 6629-4765 ?PT Time Calculation (min) (ACUTE ONLY): 11 min ? ? ?Charges:   PT Evaluation ?$PT Eval Low Complexity: 1 Low ?  ?  ?   ? ? ? ? ? ?Townes Fuhs P, PT ?Acute Rehabilitation  ?Office: [phone] ?Pager: [phone] ? ?  ? ?

## 2021-08-12 ENCOUNTER — Encounter: Payer: Self-pay | Admitting: Pulmonary Disease

## 2021-08-12 DIAGNOSIS — C3491 Malignant neoplasm of unspecified part of right bronchus or lung: Secondary | ICD-10-CM | POA: Diagnosis not present

## 2021-08-12 DIAGNOSIS — D638 Anemia in other chronic diseases classified elsewhere: Secondary | ICD-10-CM | POA: Diagnosis not present

## 2021-08-12 DIAGNOSIS — J449 Chronic obstructive pulmonary disease, unspecified: Secondary | ICD-10-CM | POA: Diagnosis not present

## 2021-08-12 DIAGNOSIS — K5900 Constipation, unspecified: Secondary | ICD-10-CM | POA: Diagnosis not present

## 2021-08-12 LAB — CBC WITH DIFFERENTIAL/PLATELET
Abs Immature Granulocytes: 0.06 10*3/uL (ref 0.00–0.07)
Basophils Absolute: 0 10*3/uL (ref 0.0–0.1)
Basophils Relative: 0 %
Eosinophils Absolute: 0 10*3/uL (ref 0.0–0.5)
Eosinophils Relative: 0 %
HCT: 21.4 % — ABNORMAL LOW (ref 39.0–52.0)
Hemoglobin: 7.2 g/dL — ABNORMAL LOW (ref 13.0–17.0)
Immature Granulocytes: 2 %
Lymphocytes Relative: 4 %
Lymphs Abs: 0.1 10*3/uL — ABNORMAL LOW (ref 0.7–4.0)
MCH: 33.8 pg (ref 26.0–34.0)
MCHC: 33.6 g/dL (ref 30.0–36.0)
MCV: 100.5 fL — ABNORMAL HIGH (ref 80.0–100.0)
Monocytes Absolute: 0.1 10*3/uL (ref 0.1–1.0)
Monocytes Relative: 4 %
Neutro Abs: 2.9 10*3/uL (ref 1.7–7.7)
Neutrophils Relative %: 90 %
Platelets: 125 10*3/uL — ABNORMAL LOW (ref 150–400)
RBC: 2.13 MIL/uL — ABNORMAL LOW (ref 4.22–5.81)
RDW: 15.7 % — ABNORMAL HIGH (ref 11.5–15.5)
WBC: 3.2 10*3/uL — ABNORMAL LOW (ref 4.0–10.5)
nRBC: 0 % (ref 0.0–0.2)

## 2021-08-12 LAB — BASIC METABOLIC PANEL
Anion gap: 6 (ref 5–15)
BUN: 11 mg/dL (ref 6–20)
CO2: 23 mmol/L (ref 22–32)
Calcium: 8.2 mg/dL — ABNORMAL LOW (ref 8.9–10.3)
Chloride: 107 mmol/L (ref 98–111)
Creatinine, Ser: 0.8 mg/dL (ref 0.61–1.24)
GFR, Estimated: >60 mL/min (ref >60)
Glucose, Bld: 142 mg/dL — ABNORMAL HIGH (ref 70–99)
Potassium: 3.5 mmol/L (ref 3.5–5.1)
Sodium: 136 mmol/L (ref 135–145)

## 2021-08-12 LAB — ACID FAST SMEAR (AFB, MYCOBACTERIA): Acid Fast Smear: NEGATIVE

## 2021-08-12 LAB — MAGNESIUM: Magnesium: 2.1 mg/dL (ref 1.7–2.4)

## 2021-08-12 MED ORDER — IPRATROPIUM-ALBUTEROL 0.5-2.5 (3) MG/3ML IN SOLN
3.0000 mL | RESPIRATORY_TRACT | 0 refills | Status: AC | PRN
  Filled 2021-08-16: qty 90, 5d supply, fill #0

## 2021-08-12 MED ORDER — BUDESONIDE-FORMOTEROL FUMARATE 160-4.5 MCG/ACT IN AERO: 2.0000 | INHALATION_SPRAY | Freq: Two times a day (BID) | RESPIRATORY_TRACT | 0 refills | Status: AC

## 2021-08-12 MED ORDER — PREDNISONE 10 MG PO TABS: ORAL_TABLET | ORAL | Status: AC

## 2021-08-12 MED ORDER — CEFUROXIME AXETIL 500 MG PO TABS: 500.0000 mg | ORAL_TABLET | Freq: Two times a day (BID) | ORAL | 0 refills | Status: AC

## 2021-08-12 MED ORDER — ENSURE ENLIVE PO LIQD: 237.0000 mL | Freq: Two times a day (BID) | ORAL | Status: AC

## 2021-08-12 MED ORDER — HEPARIN SOD (PORK) LOCK FLUSH 100 UNIT/ML IV SOLN
500.0000 [IU] | INTRAVENOUS | Status: AC | PRN
  Administered 2021-08-12: 500 [IU]
  Filled 2021-08-12: qty 5

## 2021-08-12 MED ORDER — SENNOSIDES-DOCUSATE SODIUM 8.6-50 MG PO TABS: 1.0000 | ORAL_TABLET | Freq: Two times a day (BID) | ORAL | 0 refills | Status: AC

## 2021-08-12 NOTE — Plan of Care (Signed)
?  Problem: Clinical Measurements: ?Goal: Will remain free from infection ?Outcome: Progressing ?Goal: Diagnostic test results will improve ?Outcome: Progressing ?Goal: Respiratory complications will improve ?Outcome: Progressing ?  ?

## 2021-08-12 NOTE — Progress Notes (Signed)
?  Transition of Care (TOC) Screening Note ? ? ?Patient Details  ?Name: Richard Li ?Date of Birth: 04-10-1961 ? ? ?Transition of Care [facility]) CM/SW Contact:    ?Dessa Phi, RN ?Phone Number: ?08/12/2021, 10:47 AM ? ? ? ?Transition of Care Department [facility]) has reviewed patient and no TOC needs have been identified at this time. We will continue to monitor patient advancement through interdisciplinary progression rounds. If new patient transition needs arise, please place a TOC consult. ?  ? ?

## 2021-08-12 NOTE — Discharge Summary (Signed)
Physician Discharge Summary  ?ULICES MAACK XEN:[id number] DOB: [birth year] DOA: 08/10/2021 ? ?PCP: [name], DO ? ?Admit date: 08/10/2021 ?Discharge date: 08/12/2021 ? ?Admitted From: Home ?Disposition: Home ? ?Recommendations for Outpatient Follow-up:  ?Follow up with PCP in 1 week with repeat CBC/BMP ?Outpatient follow-up with oncology and pulmonary ?Follow up in ED if symptoms worsen or new appear ? ? ?Home Health: No ?Equipment/Devices: None ? ?Discharge Condition: Stable ?CODE STATUS: Full ?Diet recommendation: Heart healthy ? 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  On presentation, he was tachycardic.  CT angio chest was negative for PE but showed large left pleural effusion and right lower lobe infiltrate.  He underwent ultrasound-guided thoracentesis by IR with removal of 1.7 L fluid.  He was started on broad-spectrum antibiotics.  He was subsequently also started on IV steroids.  During the hospitalization, his condition has improved.  Respiratory status has remained stable and he feels much better and wants to go home today.  He will be discharged home today on oral antibiotics and steroids.  Outpatient follow-up with PCP/oncology and pulmonary. ? ?Discharge Diagnoses:  ? 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 ?-underwent ultrasound-guided thoracentesis by IR with removal of 1.7 L fluid.  ?-Currently on cefepime.  DC'd Flagyl.  Cultures negative so far ?-Currently on room air. ?-Discharged home on oral Ceftin for 5 more days. ?  ?Stage IV right lung adenocarcinoma ?Recurrent left pleural  effusion ?-Follows up with Dr. Julien Nordmann. Currently undergoing chemotherapy ?-If patient continues to have recurrent pleural effusion, he might need Pleurx catheter placement.  Outpatient follow-up with pulmonary regarding the same  ? ?hypomagnesemia ?-Improved ?  ?Nausea and dry heaving ?Poor oral intake ?-Possibly from chemotherapy.  Treated with IV fluids and antiemetics ?-Oral intake is improving.  Follow nutrition recommendations.  Outpatient follow-up.   ? ?Generalized weakness ?-Has tolerated PT eval. ?  ?Thrombocytopenia ?-No signs of bleeding.  Outpatient follow-up. ?  ?Hyponatremia ?-Resolved. ? ?Lactic acidosis ?-Possibly from dehydration/poor intake ?-Resolved ?  ?Constipation ?-Continue laxatives ?  ?Goals of care ?-Palliative care consultation for goals of care discussion is pending.  Currently DNR.  This can happen as an outpatient. ?  ?Anemia of chronic disease ?-From cancer.  Hemoglobin on the lower side.  This can be followed up as an outpatient and blood transfusion can be done as an outpatient if needed by oncology ?  ?Essential hypertension ?-Blood pressure stable.  Outpatient follow-up ? ?COPD with possible exacerbation ?-Treated with with IV Solu-Medrol along with nebs.  Wheezing improved.  Patient feels that nebs have helped him and is requesting nebs on discharge.  Will prescribe nebulizer machine along with DuoNeb as needed.  Continue prednisone 40 mg daily for 7 days then switch back to 10 mg daily which patient has been on for a while.  We will also prescribe Symbicort on discharge.  Outpatient follow-up with pulmonary.   ? ?GAD ?-Stable.  Continue home regimen.  Outpatient follow-up ? ?Leukopenia ?-Mild.  Outpatient follow-up. ? ?Overweight ?-Outpatient follow-up ?  ? ?Discharge Instructions ? ?Discharge Instructions   ? ? Ambulatory referral to Pulmonology   Complete by: As directed ?  ? Reason for referral:  Pleural Effusion  ? Diet - low sodium heart healthy   Complete by: As directed ?   ? For home use only DME Nebulizer machine   Complete by: As directed ?  ? Patient needs a nebulizer to treat with the following condition: COPD exacerbation ([place])  ? Length of Need: Lifetime  ? Increase activity slowly   Complete by: As directed ?  ? ?  ? ?Allergies as of 08/12/2021   ? ?   Reactions  ? Penicillins   ? Unknown reaction  ? Amoxicillin Rash  ? ?  ? ?  ?Medication List  ?  ? ?TAKE these medications   ? ?acetaminophen 500 MG tablet ?Commonly known as: TYLENOL ?Take 1,000 mg by mouth every 8 (eight) hours as needed for moderate pain or mild pain. ?  ?budesonide-formoterol 160-4.5 MCG/ACT inhaler ?Commonly known as: Symbicort ?Inhale 2 puffs into the lungs 2 (two) times daily. ?  ?cefUROXime 500 MG tablet ?Commonly known as: CEFTIN ?Take 1 tablet (500 mg total) by mouth 2 (two) times daily for 5 days. ?  ?Combivent Respimat 20-100 MCG/ACT Aers respimat ?Generic drug: Ipratropium-Albuterol ?INHALE 1 TO 2 PUFFS BY MOUTH EVERY 6 HOURS AS NEEDED FOR WHEEZING ?What changed:  ?how much to take ?how to take this ?when to take this ?reasons to take this ?additional instructions ?  ?ipratropium-albuterol 0.5-2.5 (3) MG/3ML Soln ?Commonly known as: DUONEB ?Take 3 mLs by nebulization every 4 (four) hours as needed (wheezing/shortness of breath). ?What changed: You were already taking a medication with the same name, and this prescription was added. Make sure you understand how and when to take each. ?  ?dextromethorphan-guaiFENesin 30-600 MG 12hr tablet ?Commonly known as: [place] DM ?Take 1 tablet by mouth 2 (two) times daily. ?  ?feeding supplement Liqd ?Take 237 mLs by mouth 2 (two) times daily between meals. ?What changed: when to take this ?  ?fenofibrate 160 MG tablet ?Take 1 tablet by mouth once daily ?  ?ferrous sulfate 325 (65 FE) MG tablet ?Take 1 tablet (325 mg total) by mouth daily with breakfast. ?  ?folic acid 1 MG tablet ?Commonly known as: FOLVITE ?Take 1 tablet by mouth once daily ?  ?lactulose 10  GM/15ML solution ?Commonly known as: [place] ?TAKE 15 MLS BY MOUTH 3 TIMES DAILY Strength: 10 GM/15ML ?What changed:  ?how much to take ?how to take this ?when to take this ?reasons to take this ?additional instructions ?  ?lidocaine-prilocaine cream ?Commonly known as: EMLA ?Apply 1 application topically as needed. ?What changed:  ?when to take this ?reasons to take this ?  ?LORazepam 1 MG tablet ?Commonly known as: ATIVAN ?Take 1 tablet (1 mg total) by mouth every 8 (eight) hours as needed for anxiety. ?  ?multivitamin with minerals Tabs tablet ?Take 1 tablet by mouth daily. ?  ?oxyCODONE-acetaminophen 5-325 MG tablet ?Commonly known as: PERCOCET/ROXICET ?Take 1 tablet by mouth every 6 (six) hours as needed for severe pain. ?  ?potassium chloride SA 20 MEQ tablet ?Commonly known as: KLOR-CON M ?Take 1 tablet (20 mEq total) by mouth daily. ?  ?predniSONE 10 MG tablet ?Commonly known as: DELTASONE ?Prednisone 40 mg daily for 7 days then switch back to 10 mg daily ?Start taking on: August 13, 2021 ?What changed:  ?how much to take ?how to take this ?when to take this ?additional instructions ?  ?prochlorperazine 10 MG tablet ?Commonly known as: COMPAZINE ?Take 1 tablet (10 mg total) by mouth every 6 (six) hours as needed. ?  What changed: reasons to take this ?  ?senna-docusate 8.6-50 MG tablet ?Commonly known as: Senokot-S ?Take 1 tablet by mouth 2 (two) times daily. ?  ? ?  ? ?  ?  ? ? ?  ?Durable Medical Equipment  ?(From admission, onward)  ?  ? ? ?  ? ?  Start     Ordered  ? 08/12/21 0000  For home use only DME Nebulizer machine       ?Question Answer Comment  ?Patient needs a nebulizer to treat with the following condition COPD exacerbation ([place])   ?Length of Need Lifetime   ?  ? 08/12/21 1031  ? ?  ?  ? ?  ? ? Follow-up Information   ? ? [name], DO. Schedule an appointment as soon as possible for a visit in 1 week(s).   ?Specialty: Family Medicine ?Contact information: ?[address] ?[place] [address] ?[phone] ? ? ?  ?  ? ? Curt Bears, MD. Schedule an appointment as soon as possible for a visit in 1 week(s).   ?Specialty: Oncology ?Contact information: ?[place] ?

## 2021-08-12 NOTE — Progress Notes (Signed)
Pt education completed with pt and wife.  Belongings (cell phone, clothes) returned.  Take by wheelchair to front entrance where wife provided transportation home.  ?

## 2021-08-13 ENCOUNTER — Telehealth: Payer: Self-pay | Admitting: General Practice

## 2021-08-13 NOTE — Telephone Encounter (Signed)
Transition Care Management Unsuccessful Follow-up Telephone Call ? ?Date of discharge and from where:  08/12/21 from [facility] ? ?Attempts:  1st Attempt ? ?Reason for unsuccessful TCM follow-up call:  Left voice message ? ?  ?

## 2021-08-15 ENCOUNTER — Encounter: Payer: Self-pay | Admitting: Internal Medicine

## 2021-08-15 LAB — CULTURE, BLOOD (ROUTINE X 2)
Culture: NO GROWTH
Culture: NO GROWTH
Special Requests: ADEQUATE
Special Requests: ADEQUATE

## 2021-08-15 LAB — CULTURE, BODY FLUID W GRAM STAIN -BOTTLE: Culture: NO GROWTH

## 2021-08-16 ENCOUNTER — Emergency Department (HOSPITAL_COMMUNITY): Payer: BC Managed Care – PPO

## 2021-08-16 ENCOUNTER — Other Ambulatory Visit: Payer: Self-pay

## 2021-08-16 ENCOUNTER — Inpatient Hospital Stay (HOSPITAL_COMMUNITY)
Admission: EM | Admit: 2021-08-16 | Discharge: 2021-08-23 | DRG: 205 | Disposition: E | Payer: BC Managed Care – PPO | Attending: Family Medicine | Admitting: Family Medicine

## 2021-08-16 ENCOUNTER — Encounter: Payer: Self-pay | Admitting: Internal Medicine

## 2021-08-16 ENCOUNTER — Encounter (HOSPITAL_COMMUNITY): Payer: Self-pay | Admitting: Emergency Medicine

## 2021-08-16 ENCOUNTER — Other Ambulatory Visit (HOSPITAL_COMMUNITY): Payer: Self-pay

## 2021-08-16 DIAGNOSIS — Z7952 Long term (current) use of systemic steroids: Secondary | ICD-10-CM

## 2021-08-16 DIAGNOSIS — Z20822 Contact with and (suspected) exposure to covid-19: Secondary | ICD-10-CM | POA: Diagnosis present

## 2021-08-16 DIAGNOSIS — C3491 Malignant neoplasm of unspecified part of right bronchus or lung: Secondary | ICD-10-CM | POA: Diagnosis present

## 2021-08-16 DIAGNOSIS — Z7951 Long term (current) use of inhaled steroids: Secondary | ICD-10-CM

## 2021-08-16 DIAGNOSIS — F410 Panic disorder [episodic paroxysmal anxiety] without agoraphobia: Secondary | ICD-10-CM | POA: Diagnosis not present

## 2021-08-16 DIAGNOSIS — R531 Weakness: Secondary | ICD-10-CM

## 2021-08-16 DIAGNOSIS — Z87442 Personal history of urinary calculi: Secondary | ICD-10-CM

## 2021-08-16 DIAGNOSIS — Z79899 Other long term (current) drug therapy: Secondary | ICD-10-CM

## 2021-08-16 DIAGNOSIS — J939 Pneumothorax, unspecified: Secondary | ICD-10-CM | POA: Diagnosis not present

## 2021-08-16 DIAGNOSIS — E876 Hypokalemia: Secondary | ICD-10-CM | POA: Diagnosis present

## 2021-08-16 DIAGNOSIS — Z923 Personal history of irradiation: Secondary | ICD-10-CM

## 2021-08-16 DIAGNOSIS — E785 Hyperlipidemia, unspecified: Secondary | ICD-10-CM | POA: Diagnosis present

## 2021-08-16 DIAGNOSIS — I10 Essential (primary) hypertension: Secondary | ICD-10-CM | POA: Diagnosis present

## 2021-08-16 DIAGNOSIS — F411 Generalized anxiety disorder: Secondary | ICD-10-CM | POA: Diagnosis present

## 2021-08-16 DIAGNOSIS — C7931 Secondary malignant neoplasm of brain: Secondary | ICD-10-CM | POA: Diagnosis present

## 2021-08-16 DIAGNOSIS — D849 Immunodeficiency, unspecified: Secondary | ICD-10-CM | POA: Diagnosis present

## 2021-08-16 DIAGNOSIS — K5900 Constipation, unspecified: Secondary | ICD-10-CM | POA: Diagnosis present

## 2021-08-16 DIAGNOSIS — J449 Chronic obstructive pulmonary disease, unspecified: Secondary | ICD-10-CM | POA: Diagnosis present

## 2021-08-16 DIAGNOSIS — Z9049 Acquired absence of other specified parts of digestive tract: Secondary | ICD-10-CM

## 2021-08-16 DIAGNOSIS — Z8249 Family history of ischemic heart disease and other diseases of the circulatory system: Secondary | ICD-10-CM

## 2021-08-16 DIAGNOSIS — J702 Acute drug-induced interstitial lung disorders: Principal | ICD-10-CM | POA: Diagnosis present

## 2021-08-16 DIAGNOSIS — J309 Allergic rhinitis, unspecified: Secondary | ICD-10-CM | POA: Diagnosis present

## 2021-08-16 DIAGNOSIS — T451X5A Adverse effect of antineoplastic and immunosuppressive drugs, initial encounter: Secondary | ICD-10-CM | POA: Diagnosis present

## 2021-08-16 DIAGNOSIS — Z8709 Personal history of other diseases of the respiratory system: Secondary | ICD-10-CM

## 2021-08-16 DIAGNOSIS — J9601 Acute respiratory failure with hypoxia: Principal | ICD-10-CM | POA: Diagnosis present

## 2021-08-16 DIAGNOSIS — Z515 Encounter for palliative care: Secondary | ICD-10-CM

## 2021-08-16 DIAGNOSIS — T50905A Adverse effect of unspecified drugs, medicaments and biological substances, initial encounter: Secondary | ICD-10-CM

## 2021-08-16 DIAGNOSIS — C3490 Malignant neoplasm of unspecified part of unspecified bronchus or lung: Secondary | ICD-10-CM

## 2021-08-16 DIAGNOSIS — J189 Pneumonia, unspecified organism: Secondary | ICD-10-CM | POA: Diagnosis present

## 2021-08-16 DIAGNOSIS — K219 Gastro-esophageal reflux disease without esophagitis: Secondary | ICD-10-CM | POA: Diagnosis present

## 2021-08-16 DIAGNOSIS — I248 Other forms of acute ischemic heart disease: Secondary | ICD-10-CM | POA: Diagnosis present

## 2021-08-16 DIAGNOSIS — J4489 Other specified chronic obstructive pulmonary disease: Secondary | ICD-10-CM | POA: Diagnosis present

## 2021-08-16 DIAGNOSIS — J910 Malignant pleural effusion: Secondary | ICD-10-CM

## 2021-08-16 DIAGNOSIS — Z825 Family history of asthma and other chronic lower respiratory diseases: Secondary | ICD-10-CM

## 2021-08-16 DIAGNOSIS — D61810 Antineoplastic chemotherapy induced pancytopenia: Secondary | ICD-10-CM | POA: Diagnosis present

## 2021-08-16 DIAGNOSIS — R778 Other specified abnormalities of plasma proteins: Secondary | ICD-10-CM

## 2021-08-16 DIAGNOSIS — Z66 Do not resuscitate: Secondary | ICD-10-CM | POA: Diagnosis present

## 2021-08-16 DIAGNOSIS — Z87891 Personal history of nicotine dependence: Secondary | ICD-10-CM

## 2021-08-16 DIAGNOSIS — D61818 Other pancytopenia: Secondary | ICD-10-CM | POA: Diagnosis present

## 2021-08-16 DIAGNOSIS — D509 Iron deficiency anemia, unspecified: Secondary | ICD-10-CM | POA: Diagnosis present

## 2021-08-16 DIAGNOSIS — G9341 Metabolic encephalopathy: Secondary | ICD-10-CM

## 2021-08-16 DIAGNOSIS — J90 Pleural effusion, not elsewhere classified: Secondary | ICD-10-CM

## 2021-08-16 DIAGNOSIS — Z803 Family history of malignant neoplasm of breast: Secondary | ICD-10-CM

## 2021-08-16 DIAGNOSIS — C7901 Secondary malignant neoplasm of right kidney and renal pelvis: Secondary | ICD-10-CM | POA: Diagnosis present

## 2021-08-16 LAB — PROTIME-INR
INR: 1.1 (ref 0.8–1.2)
Prothrombin Time: 13.7 seconds (ref 11.4–15.2)

## 2021-08-16 LAB — BLOOD GAS, VENOUS
Acid-Base Excess: 3.3 mmol/L — ABNORMAL HIGH (ref 0.0–2.0)
Bicarbonate: 27.8 mmol/L (ref 20.0–28.0)
O2 Saturation: 63 %
Patient temperature: 37
pCO2, Ven: 41 mmHg — ABNORMAL LOW (ref 44–60)
pH, Ven: 7.44 — ABNORMAL HIGH (ref 7.25–7.43)
pO2, Ven: 34 mmHg (ref 32–45)

## 2021-08-16 LAB — COMPREHENSIVE METABOLIC PANEL
ALT: 32 U/L (ref 0–44)
AST: 47 U/L — ABNORMAL HIGH (ref 15–41)
Albumin: 2.2 g/dL — ABNORMAL LOW (ref 3.5–5.0)
Alkaline Phosphatase: 152 U/L — ABNORMAL HIGH (ref 38–126)
Anion gap: 7 (ref 5–15)
BUN: 13 mg/dL (ref 6–20)
CO2: 26 mmol/L (ref 22–32)
Calcium: 8.9 mg/dL (ref 8.9–10.3)
Chloride: 106 mmol/L (ref 98–111)
Creatinine, Ser: 0.72 mg/dL (ref 0.61–1.24)
GFR, Estimated: >60 mL/min (ref >60)
Glucose, Bld: 111 mg/dL — ABNORMAL HIGH (ref 70–99)
Potassium: 3.4 mmol/L — ABNORMAL LOW (ref 3.5–5.1)
Sodium: 139 mmol/L (ref 135–145)
Total Bilirubin: 0.6 mg/dL (ref 0.3–1.2)
Total Protein: 5.9 g/dL — ABNORMAL LOW (ref 6.5–8.1)

## 2021-08-16 LAB — CBC
HCT: 21.6 % — ABNORMAL LOW (ref 39.0–52.0)
Hemoglobin: 7.4 g/dL — ABNORMAL LOW (ref 13.0–17.0)
MCH: 34.9 pg — ABNORMAL HIGH (ref 26.0–34.0)
MCHC: 34.3 g/dL (ref 30.0–36.0)
MCV: 101.9 fL — ABNORMAL HIGH (ref 80.0–100.0)
Platelets: 93 10*3/uL — ABNORMAL LOW (ref 150–400)
RBC: 2.12 MIL/uL — ABNORMAL LOW (ref 4.22–5.81)
RDW: 16 % — ABNORMAL HIGH (ref 11.5–15.5)
WBC: 3.5 10*3/uL — ABNORMAL LOW (ref 4.0–10.5)
nRBC: 0.9 % — ABNORMAL HIGH (ref 0.0–0.2)

## 2021-08-16 LAB — AMMONIA: Ammonia: 24 umol/L (ref 9–35)

## 2021-08-16 LAB — TROPONIN I (HIGH SENSITIVITY)
Troponin I (High Sensitivity): 45 ng/L — ABNORMAL HIGH (ref <18)
Troponin I (High Sensitivity): 41 ng/L — ABNORMAL HIGH (ref <18)

## 2021-08-16 LAB — PHOSPHORUS: Phosphorus: 1.8 mg/dL — ABNORMAL LOW (ref 2.5–4.6)

## 2021-08-16 LAB — STREP PNEUMONIAE URINARY ANTIGEN: Strep Pneumo Urinary Antigen: NEGATIVE

## 2021-08-16 LAB — MAGNESIUM: Magnesium: 1.5 mg/dL — ABNORMAL LOW (ref 1.7–2.4)

## 2021-08-16 LAB — RESP PANEL BY RT-PCR (FLU A&B, COVID) ARPGX2
Influenza A by PCR: NEGATIVE
Influenza B by PCR: NEGATIVE
SARS Coronavirus 2 by RT PCR: NEGATIVE

## 2021-08-16 LAB — BRAIN NATRIURETIC PEPTIDE: B Natriuretic Peptide: 71.9 pg/mL (ref 0.0–100.0)

## 2021-08-16 LAB — PROCALCITONIN: Procalcitonin: 0.34 ng/mL

## 2021-08-16 LAB — MRSA NEXT GEN BY PCR, NASAL: MRSA by PCR Next Gen: NOT DETECTED

## 2021-08-16 IMAGING — DX DG CHEST 1V PORT
1 series · 1 of 1 positions shown · non-contrast
Comparison: [DATE].

CLINICAL DATA: COPD, lung cancer shortness of breath.

EXAM:
PORTABLE CHEST 1 VIEW

[chest ap]
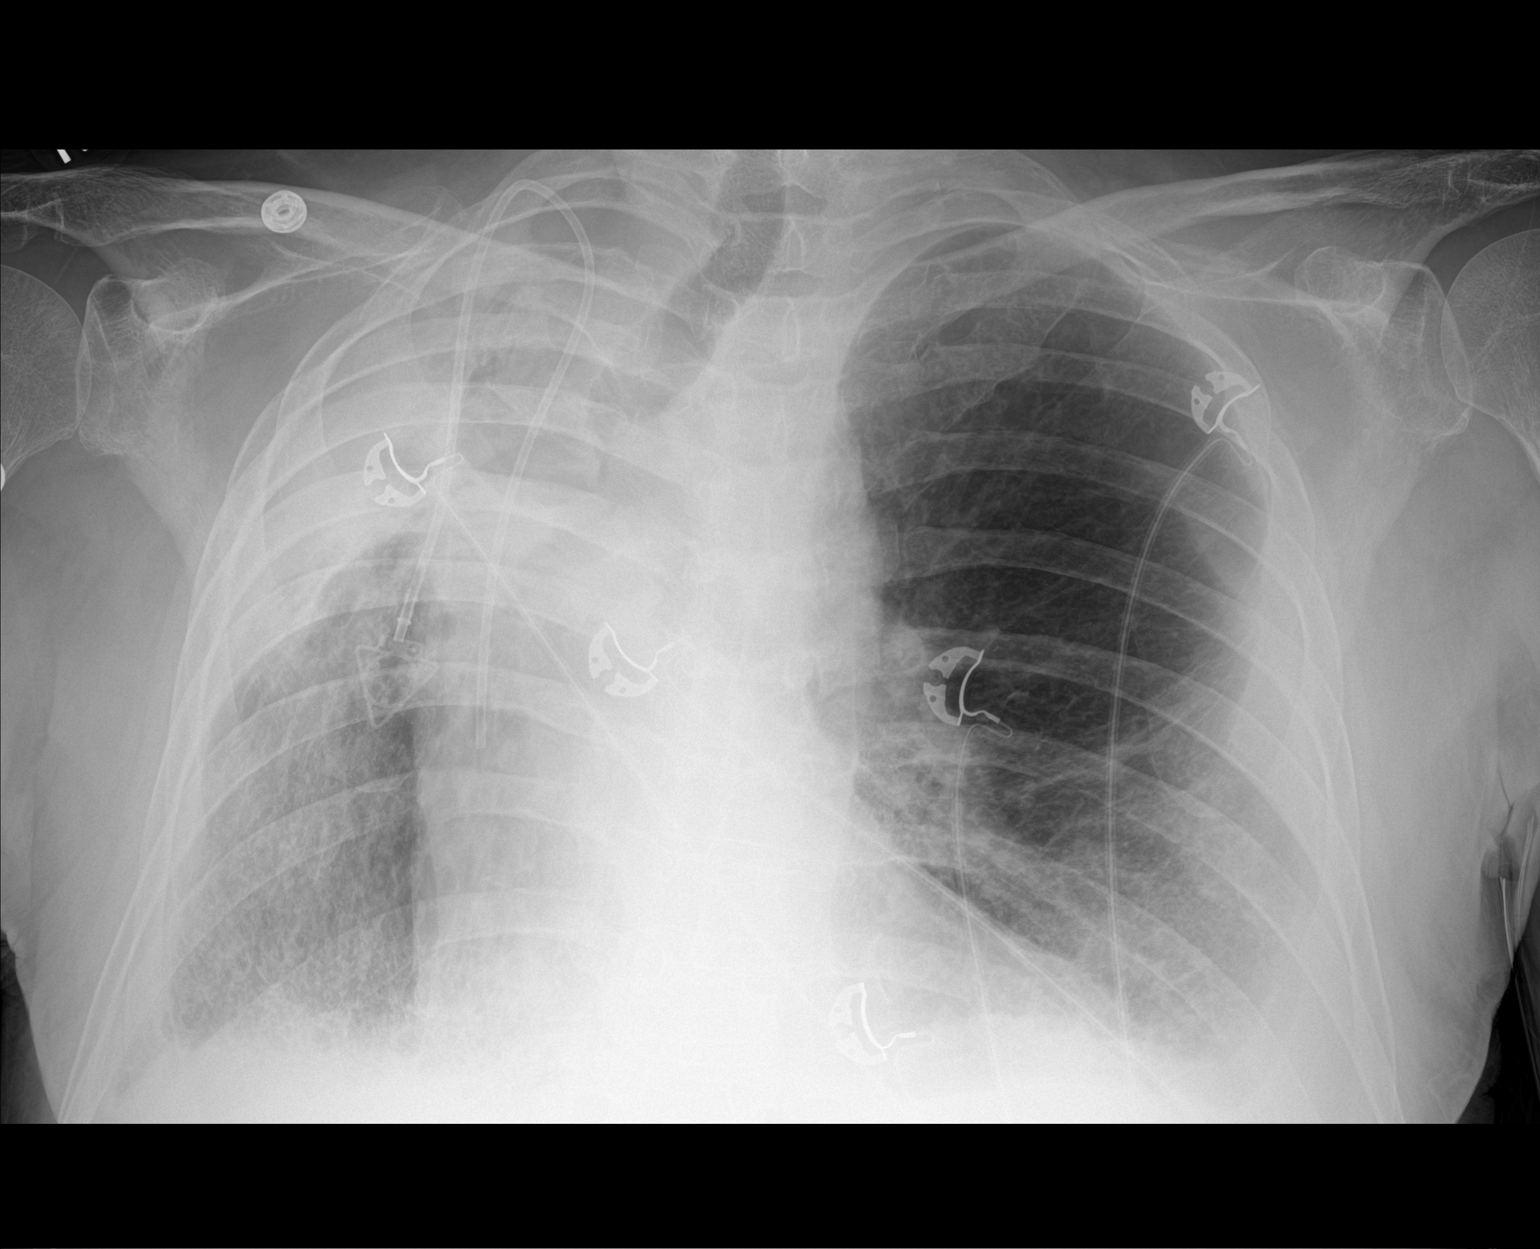

[1 of 1 positions shown; findings below may reference images not displayed]

FINDINGS: [DATE] a.m., [DATE]. The cardiac size is stable. No vascular
congestion is seen.

Stable mediastinal widening superiorly. On CT appears to be due to
aortic tortuosity.

Small left pleural effusion is again noted with portions probably
loculated in the lateral mid chest. There is a small right pleural
effusion.

Fibrotic consolidation and volume loss of the right upper lobe is
redemonstrated with interstitial and patchy hazy disease process in
the right lower lung field which could be pneumonia, edema,
lymphangitic carcinomatosis or combination.

On the left there is increased hazy interstitial consolidation in
the lower lung field which could be atelectasis or pneumonia.

Left upper lung field is clear. In all other respects no further
changes.
IMPRESSION: 1. Bilateral pleural effusions, left with probable partial
loculation.
2. Interstitial and hazy opacities again noted in the right lower
lung field, pneumonia versus edema or combination versus
lymphangitic carcinomatosis.
3. Increased hazy interstitial consolidation left lower lung field.

## 2021-08-16 IMAGING — CT CT ANGIO CHEST
2 of 6 series · 16 of 46 positions shown · IV contrast (OMNIPAQUE 350)
Comparison: CT a chest [DATE]. CT chest abdomen and pelvis
[DATE].

CLINICAL DATA: Progressive shortness of breath. Hypoxia. Tachypnea.
Personal history of lung cancer and COPD. Nonspecific abdominal
pain.



[Series 5: thins · axial · 0.85mm/px · z∈[+1297,+1620]mm · 13 of 355 slices shown]
[im 16/355  lung]
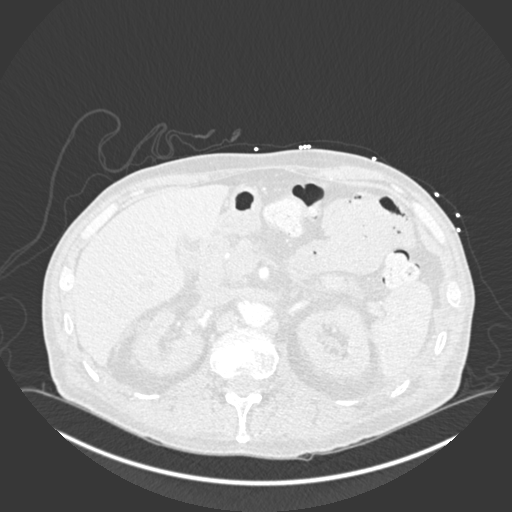
[im 47/355  soft-tissue]
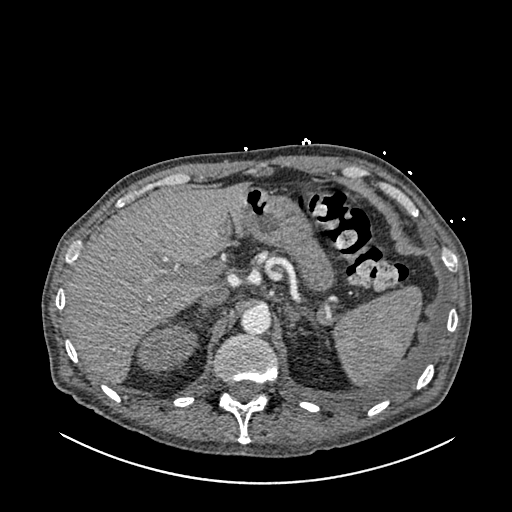
[im 77/355  lung]
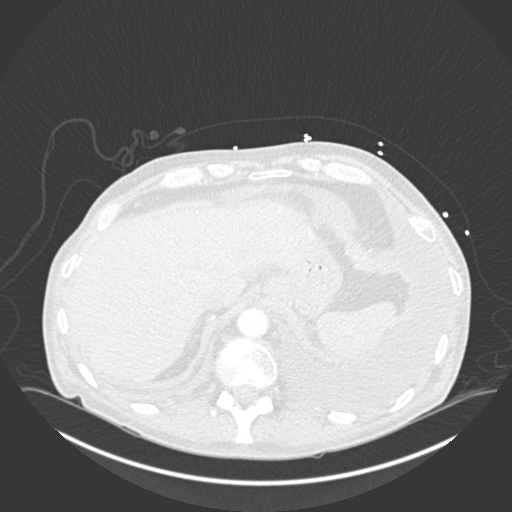
[im 93/355  soft-tissue]
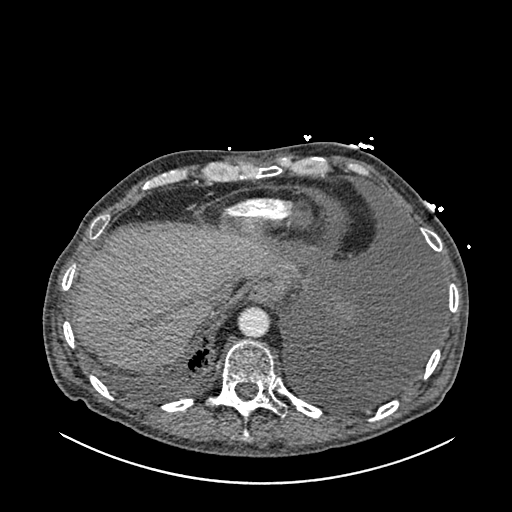
[im 124/355  lung]
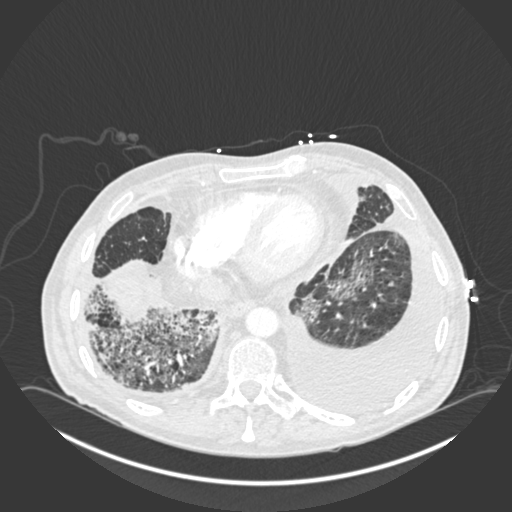
[im 154/355  soft-tissue]
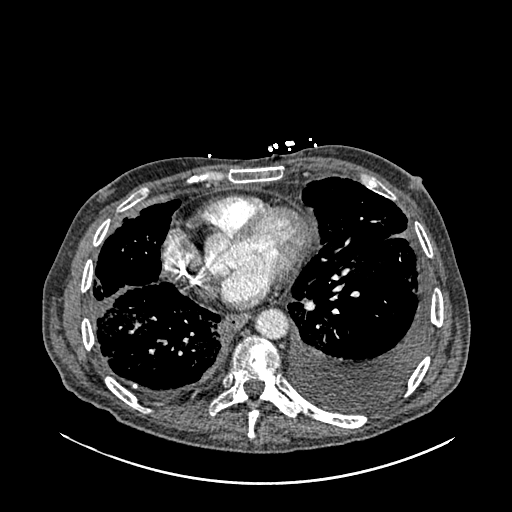
[im 185/355  lung]
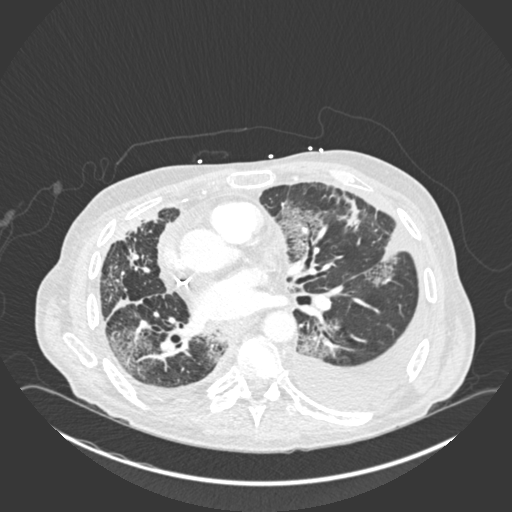
[im 201/355  soft-tissue]
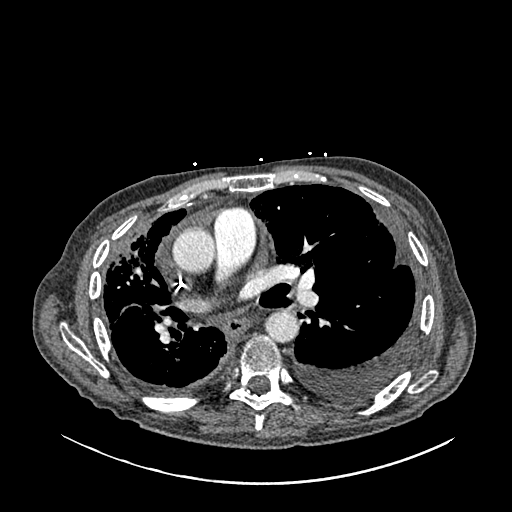
[im 231/355  lung]
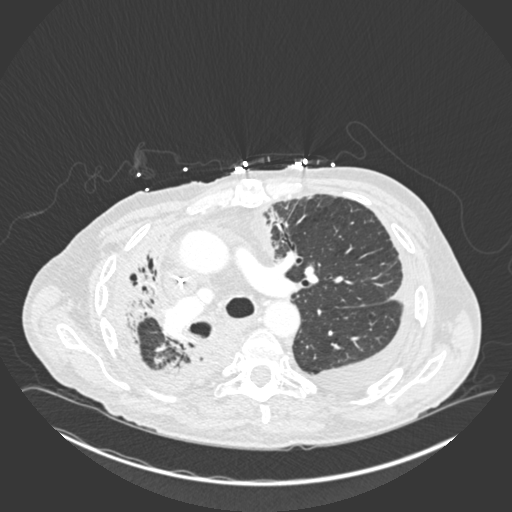
[im 262/355  soft-tissue]
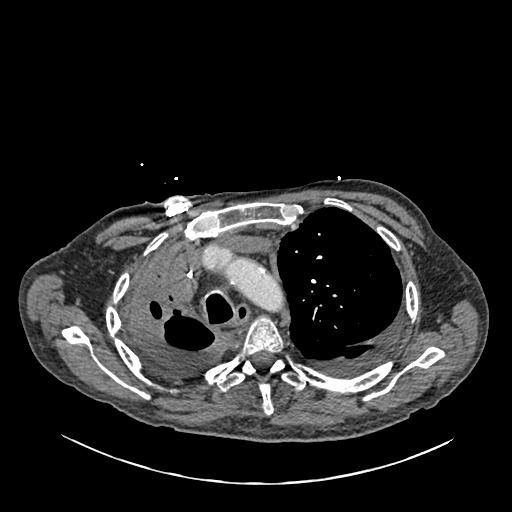
[im 278/355  lung]
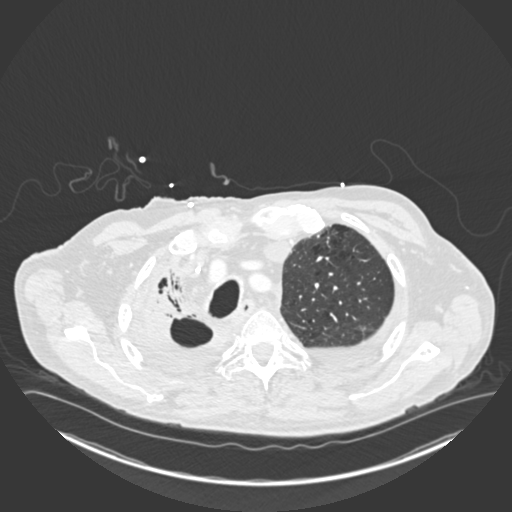
[im 308/355  soft-tissue]
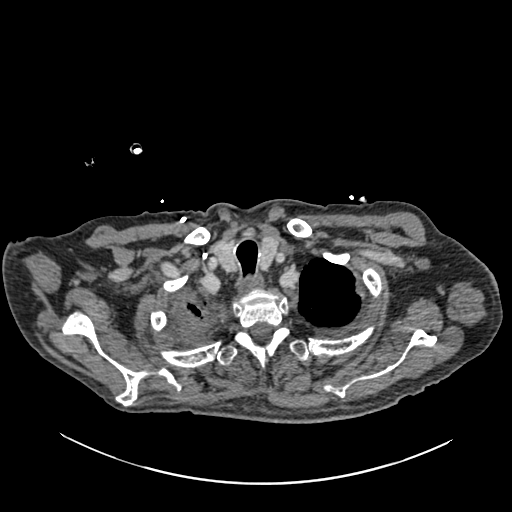
[im 339/355  lung]
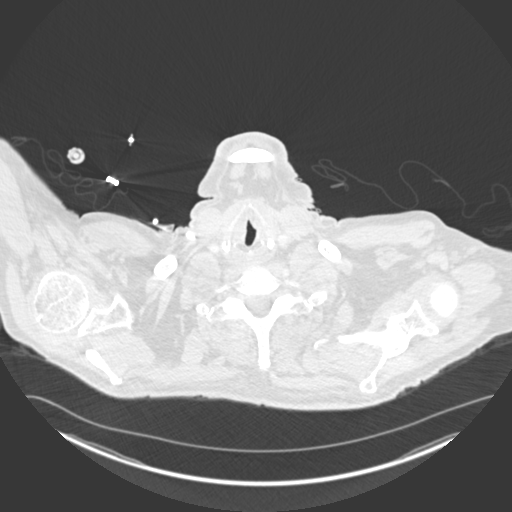

[Series 7: coronal mpr · coronal · 0.68mm/px · 3 of 150 slices shown]
[im 38/150  soft-tissue]
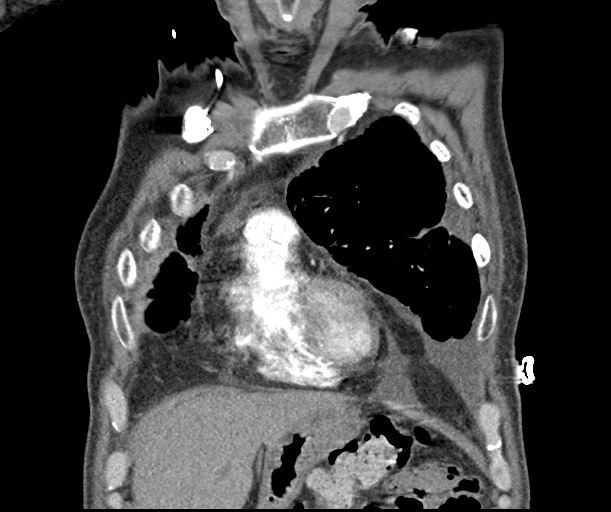
[im 75/150  soft-tissue]
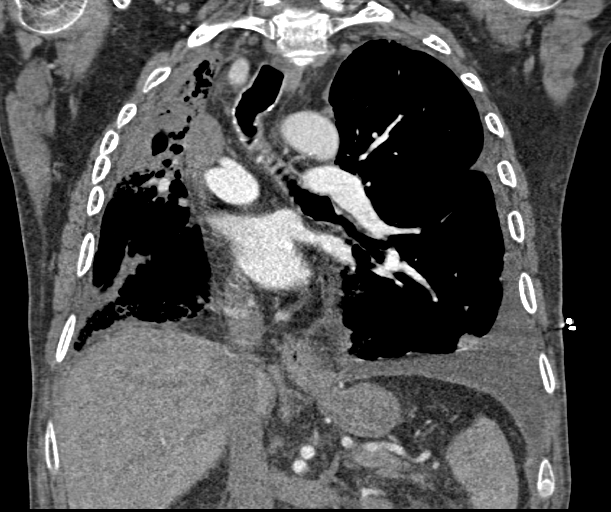
[im 112/150  soft-tissue]
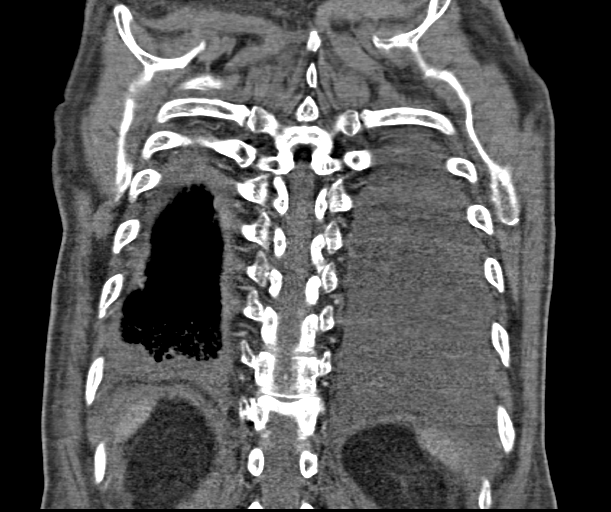

[16 of 46 positions shown; findings below may reference images not displayed]

RADIATION DOSE REDUCTION: This exam was performed according to the
departmental dose-optimization program which includes automated
exposure control, adjustment of the mA and/or kV according to
patient size and/or use of iterative reconstruction technique.

CONTRAST:  100mL OMNIPAQUE IOHEXOL 350 MG/ML SOLN
FINDINGS: CTA CHEST FINDINGS

Cardiovascular: Heart size is normal. Right IJ Port-A-Cath again
noted. Minimal pericardial fluid is present. Atherosclerotic
calcifications are again seen at the aortic arch. Great vessel
origins are within normal limits. No aneurysm or stenosis is
present.

Pulmonary artery opacification is excellent. No focal filling
defects are present to suggest pulmonary embolus.

Mediastinum/Nodes: No significant mediastinal, hilar, or axillary
adenopathy is present.

Lungs/Pleura: Post treatment changes in the right upper lobe again
noted with prominent air bronchograms. A large left pleural effusion
has decreased slightly. Fluid and pleural thickening noted along the
major fissures.

Progressive diffuse airspace disease is present in the lower lobes
bilaterally and lingula. Minimal dependent atelectasis is present.

Musculoskeletal: A T12 compression fracture is stable. No new
fractures are present. No chest wall lesions are present. Ribs are
within normal limits.

Review of the MIP images confirms the above findings.

CT ABDOMEN and PELVIS FINDINGS

Hepatobiliary: No focal liver abnormality is seen. No gallstones,
gallbladder wall thickening, or biliary dilatation.

Pancreas: Unremarkable. No pancreatic ductal dilatation or
surrounding inflammatory changes.

Spleen: Normal in size without focal abnormality.

Adrenals/Urinary Tract: Adrenal glands are normal bilaterally.
Kidneys are unremarkable. Ureters are within normal limits. Mild
bladder wall thickening is similar the prior exam.

Stomach/Bowel: Stomach and duodenum are within normal limits. Small
bowel is unremarkable. Terminal ileum is within normal limits.
Patient is status post appendectomy. The ascending and transverse
colon are within normal limits. Descending and sigmoid colon are
within normal limits.

Vascular/Lymphatic: Atherosclerotic calcifications are present in
the aorta and branch vessels. No aneurysm or focal stenosis is
present.

Reproductive: Prostate is enlarged measuring 4.7 cm in transverse
diameter slight impact on the inferior bladder. No obstruction.

Other: Mild stranding is present throughout the mesentery. No free
fluid or free air. No significant ventral hernia.

Musculoskeletal: T12 fracture is stable. Vertebral body heights and
alignment otherwise normal. Bony pelvis is within normal limits.
Hips are located and within normal limits.

Review of the MIP images confirms the above findings.
IMPRESSION: 1. No pulmonary embolus.
2. Post treatment changes in the right upper lobe.
3. Progressive airspace disease involving the lower lobes
bilaterally and lingula, consistent with pneumonia.
4. Slight decrease in size of large left pleural effusion.
5. Stable T12 compression fracture.
6. Mild mesenteric edema is nonspecific.
7. No other acute abnormality of the abdomen or pelvis.
8. Enlarged prostate gland with slight impact on the inferior
bladder.
9. Aortic Atherosclerosis ([WQ]-[WQ]).

## 2021-08-16 IMAGING — CT CT ABD-PELV W/ CM
2 of 5 series · 14 of 46 positions shown, 16 images · IV contrast (OMNIPAQUE 350)
Comparison: CT a chest [DATE]. CT chest abdomen and pelvis
[DATE].

CLINICAL DATA: Progressive shortness of breath. Hypoxia. Tachypnea.
Personal history of lung cancer and COPD. Nonspecific abdominal
pain.



[Series 2: axial st · axial · 0.83mm/px · z∈[+980,+1415]mm · 11 of 101 slices shown, 13 images]
[im 7/101  soft-tissue]
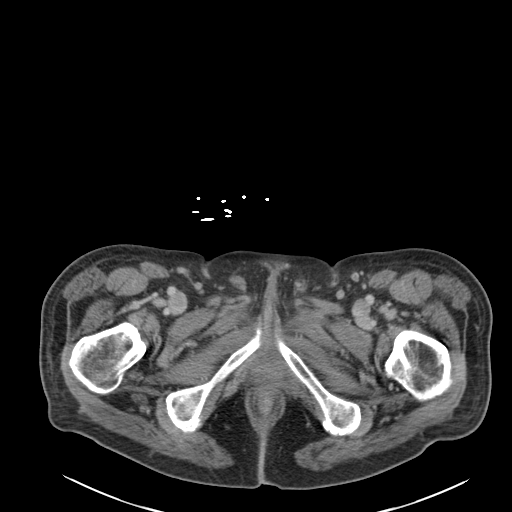
[im 7/101  bone]
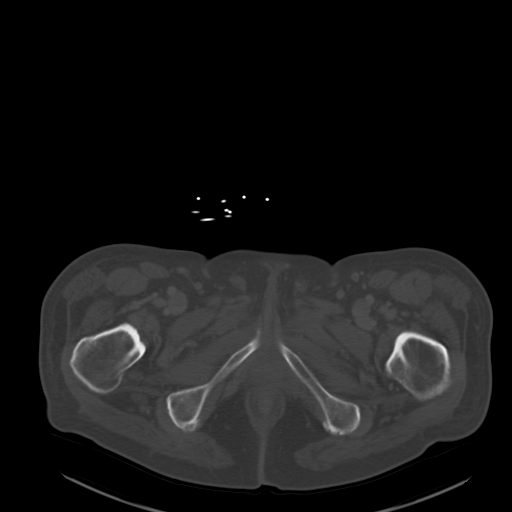
[im 14/101  soft-tissue]
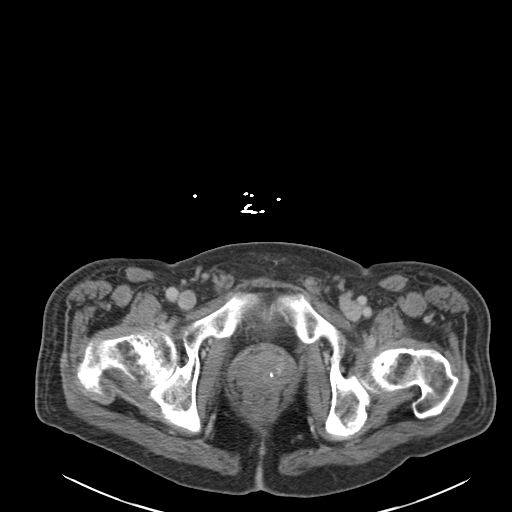
[im 27/101  soft-tissue]
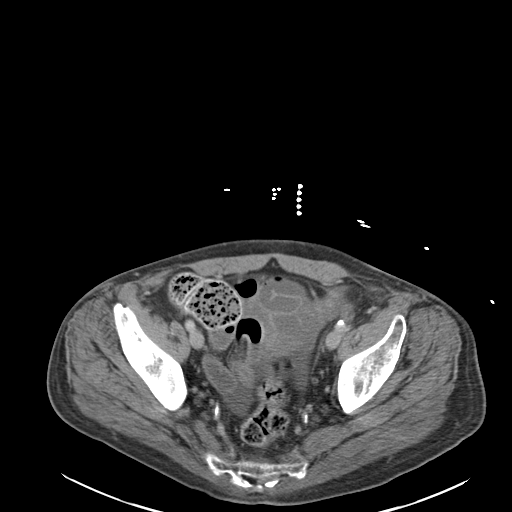
[im 34/101  soft-tissue]
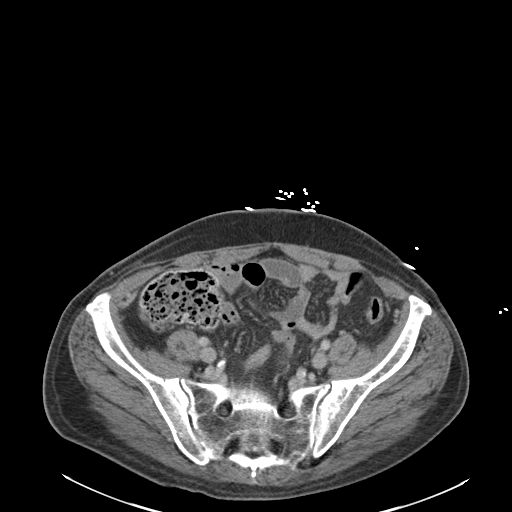
[im 41/101  soft-tissue]
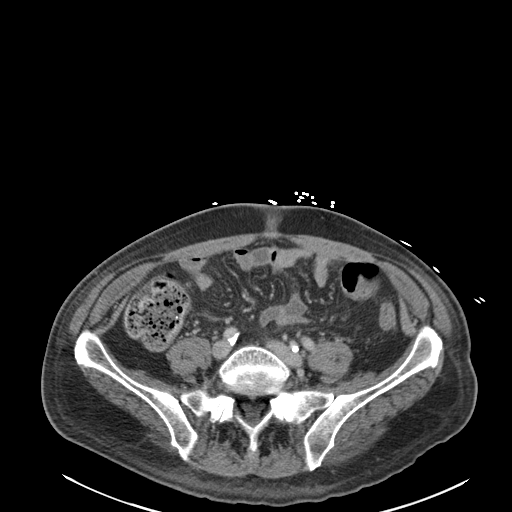
[im 54/101  soft-tissue]
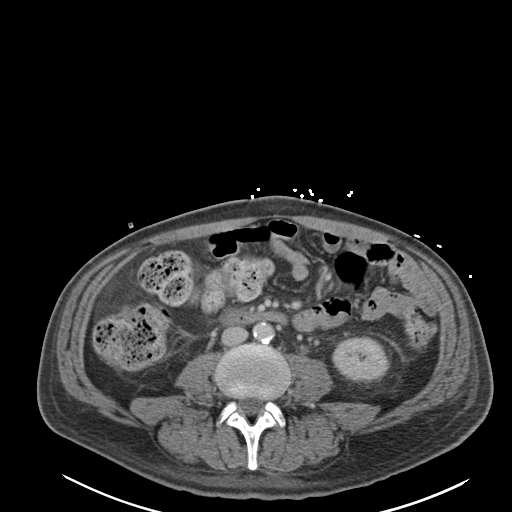
[im 61/101  soft-tissue]
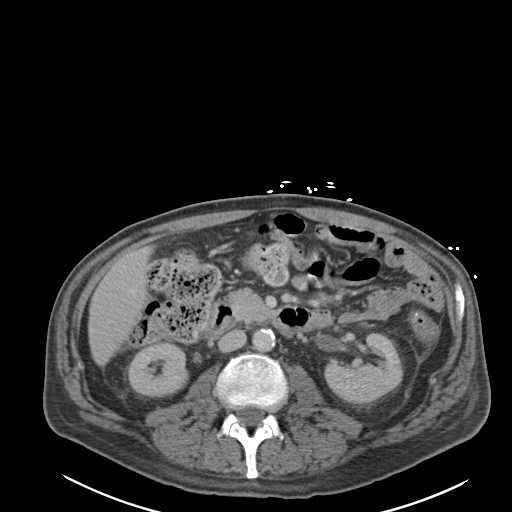
[im 67/101  soft-tissue]
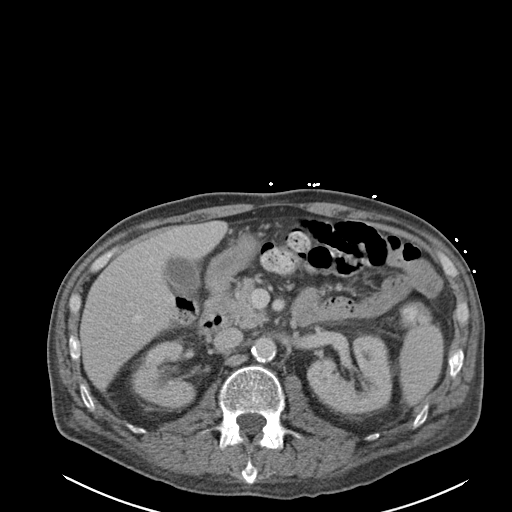
[im 74/101  soft-tissue]
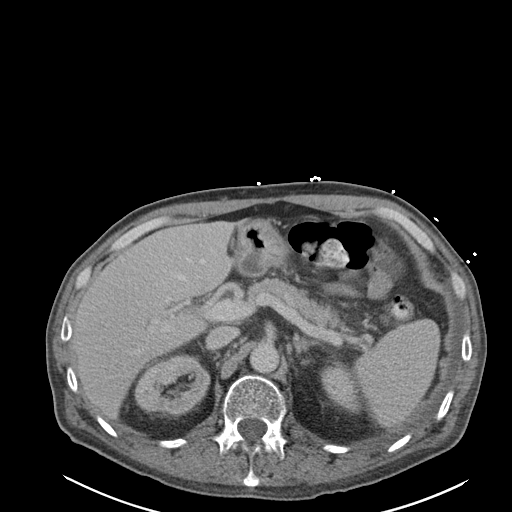
[im 74/101  bone]
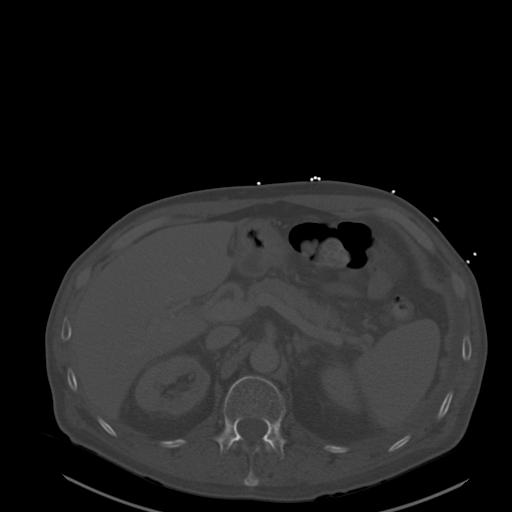
[im 87/101  soft-tissue]
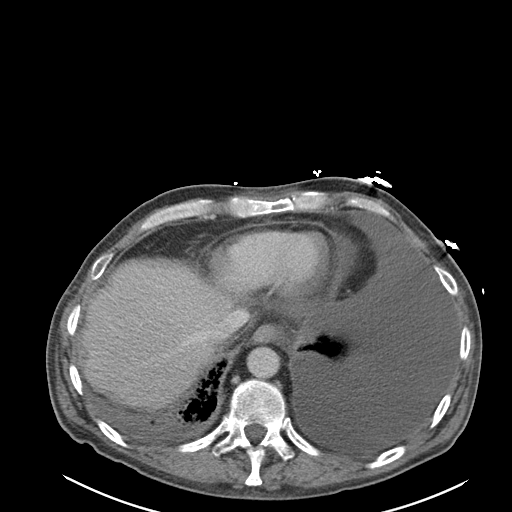
[im 94/101  soft-tissue]
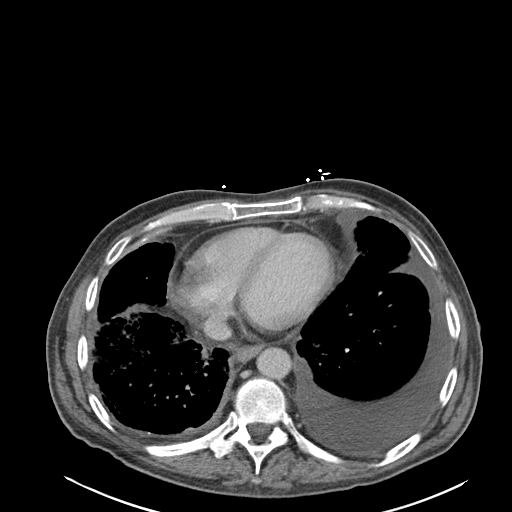

[Series 5: coronal st · coronal · 0.77mm/px · 3 of 101 slices shown]
[im 34/101  soft-tissue]
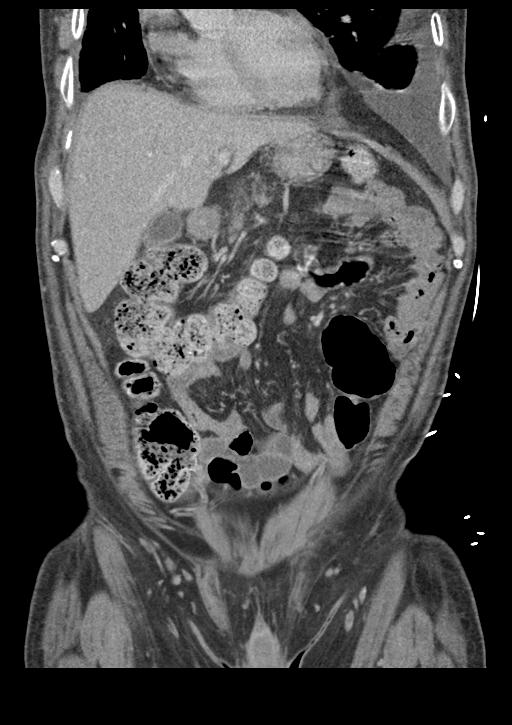
[im 45/101  soft-tissue]
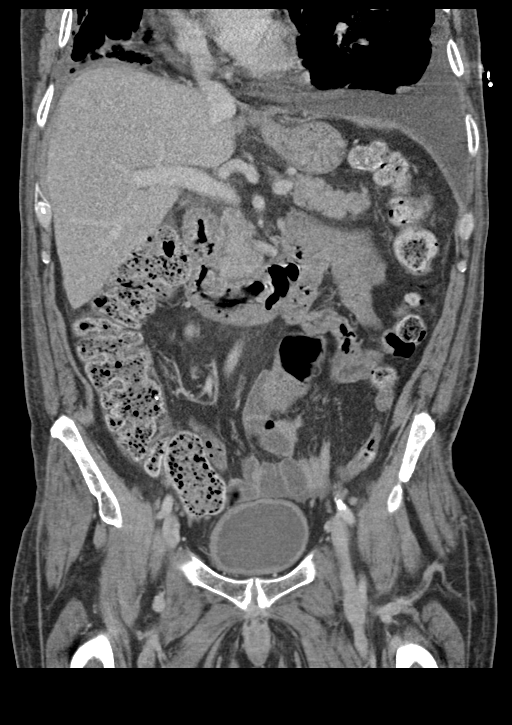
[im 56/101  soft-tissue]
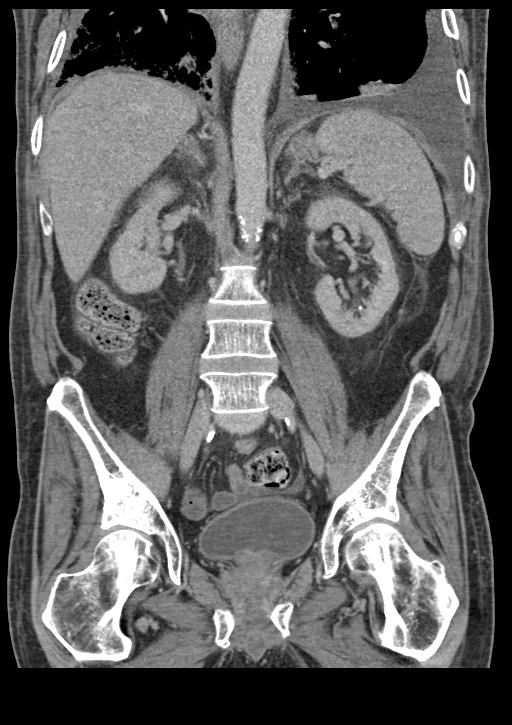

[14 of 46 positions shown; findings below may reference images not displayed]

RADIATION DOSE REDUCTION: This exam was performed according to the
departmental dose-optimization program which includes automated
exposure control, adjustment of the mA and/or kV according to
patient size and/or use of iterative reconstruction technique.

CONTRAST:  100mL OMNIPAQUE IOHEXOL 350 MG/ML SOLN
FINDINGS: CTA CHEST FINDINGS

Cardiovascular: Heart size is normal. Right IJ Port-A-Cath again
noted. Minimal pericardial fluid is present. Atherosclerotic
calcifications are again seen at the aortic arch. Great vessel
origins are within normal limits. No aneurysm or stenosis is
present.

Pulmonary artery opacification is excellent. No focal filling
defects are present to suggest pulmonary embolus.

Mediastinum/Nodes: No significant mediastinal, hilar, or axillary
adenopathy is present.

Lungs/Pleura: Post treatment changes in the right upper lobe again
noted with prominent air bronchograms. A large left pleural effusion
has decreased slightly. Fluid and pleural thickening noted along the
major fissures.

Progressive diffuse airspace disease is present in the lower lobes
bilaterally and lingula. Minimal dependent atelectasis is present.

Musculoskeletal: A T12 compression fracture is stable. No new
fractures are present. No chest wall lesions are present. Ribs are
within normal limits.

Review of the MIP images confirms the above findings.

CT ABDOMEN and PELVIS FINDINGS

Hepatobiliary: No focal liver abnormality is seen. No gallstones,
gallbladder wall thickening, or biliary dilatation.

Pancreas: Unremarkable. No pancreatic ductal dilatation or
surrounding inflammatory changes.

Spleen: Normal in size without focal abnormality.

Adrenals/Urinary Tract: Adrenal glands are normal bilaterally.
Kidneys are unremarkable. Ureters are within normal limits. Mild
bladder wall thickening is similar the prior exam.

Stomach/Bowel: Stomach and duodenum are within normal limits. Small
bowel is unremarkable. Terminal ileum is within normal limits.
Patient is status post appendectomy. The ascending and transverse
colon are within normal limits. Descending and sigmoid colon are
within normal limits.

Vascular/Lymphatic: Atherosclerotic calcifications are present in
the aorta and branch vessels. No aneurysm or focal stenosis is
present.

Reproductive: Prostate is enlarged measuring 4.7 cm in transverse
diameter slight impact on the inferior bladder. No obstruction.

Other: Mild stranding is present throughout the mesentery. No free
fluid or free air. No significant ventral hernia.

Musculoskeletal: T12 fracture is stable. Vertebral body heights and
alignment otherwise normal. Bony pelvis is within normal limits.
Hips are located and within normal limits.

Review of the MIP images confirms the above findings.
IMPRESSION: 1. No pulmonary embolus.
2. Post treatment changes in the right upper lobe.
3. Progressive airspace disease involving the lower lobes
bilaterally and lingula, consistent with pneumonia.
4. Slight decrease in size of large left pleural effusion.
5. Stable T12 compression fracture.
6. Mild mesenteric edema is nonspecific.
7. No other acute abnormality of the abdomen or pelvis.
8. Enlarged prostate gland with slight impact on the inferior
bladder.
9. Aortic Atherosclerosis ([WQ]-[WQ]).

## 2021-08-16 MED ORDER — CHLORHEXIDINE GLUCONATE CLOTH 2 % EX PADS
6.0000 | MEDICATED_PAD | Freq: Every day | CUTANEOUS | Status: DC
  Administered 2021-08-16 – 2021-08-20 (×5): 6 via TOPICAL

## 2021-08-16 MED ORDER — IPRATROPIUM-ALBUTEROL 0.5-2.5 (3) MG/3ML IN SOLN
3.0000 mL | Freq: Four times a day (QID) | RESPIRATORY_TRACT | Status: DC
  Administered 2021-08-16 (×2): 3 mL via RESPIRATORY_TRACT
  Filled 2021-08-16 (×2): qty 3

## 2021-08-16 MED ORDER — ORAL CARE MOUTH RINSE
15.0000 mL | Freq: Two times a day (BID) | OROMUCOSAL | Status: DC
  Administered 2021-08-16 – 2021-08-20 (×9): 15 mL via OROMUCOSAL

## 2021-08-16 MED ORDER — IPRATROPIUM BROMIDE 0.02 % IN SOLN: 0.5000 mg | Freq: Four times a day (QID) | RESPIRATORY_TRACT | Status: DC

## 2021-08-16 MED ORDER — POTASSIUM CHLORIDE CRYS ER 20 MEQ PO TBCR
40.0000 meq | EXTENDED_RELEASE_TABLET | Freq: Once | ORAL | Status: AC
  Administered 2021-08-16: 40 meq via ORAL
  Filled 2021-08-16: qty 2

## 2021-08-16 MED ORDER — K PHOS MONO-SOD PHOS DI & MONO 155-852-130 MG PO TABS
500.0000 mg | ORAL_TABLET | Freq: Three times a day (TID) | ORAL | Status: AC
  Administered 2021-08-16 – 2021-08-17 (×4): 500 mg via ORAL
  Filled 2021-08-16 (×5): qty 2

## 2021-08-16 MED ORDER — LACTULOSE 10 GM/15ML PO SOLN: 15.0000 g | Freq: Every day | ORAL | Status: DC | PRN

## 2021-08-16 MED ORDER — IPRATROPIUM-ALBUTEROL 0.5-2.5 (3) MG/3ML IN SOLN
3.0000 mL | Freq: Three times a day (TID) | RESPIRATORY_TRACT | Status: DC
  Administered 2021-08-17 – 2021-08-21 (×12): 3 mL via RESPIRATORY_TRACT
  Filled 2021-08-16 (×14): qty 3

## 2021-08-16 MED ORDER — PROCHLORPERAZINE MALEATE 10 MG PO TABS: 10.0000 mg | ORAL_TABLET | Freq: Four times a day (QID) | ORAL | Status: DC | PRN

## 2021-08-16 MED ORDER — ACETAMINOPHEN 325 MG PO TABS
650.0000 mg | ORAL_TABLET | Freq: Four times a day (QID) | ORAL | Status: DC | PRN
  Administered 2021-08-20 – 2021-08-21 (×2): 650 mg via ORAL
  Filled 2021-08-16 (×2): qty 2

## 2021-08-16 MED ORDER — OXYCODONE HCL 5 MG PO TABS
5.0000 mg | ORAL_TABLET | ORAL | Status: DC | PRN
  Administered 2021-08-16 – 2021-08-18 (×8): 5 mg via ORAL
  Filled 2021-08-16 (×7): qty 1

## 2021-08-16 MED ORDER — LORAZEPAM 1 MG PO TABS
1.0000 mg | ORAL_TABLET | Freq: Once | ORAL | Status: AC | PRN
  Administered 2021-08-16: 1 mg via ORAL
  Filled 2021-08-16: qty 1

## 2021-08-16 MED ORDER — SODIUM CHLORIDE 0.9 % IV SOLN: INTRAVENOUS | Status: DC | PRN

## 2021-08-16 MED ORDER — ALBUTEROL SULFATE (2.5 MG/3ML) 0.083% IN NEBU: 2.5000 mg | INHALATION_SOLUTION | Freq: Four times a day (QID) | RESPIRATORY_TRACT | Status: DC

## 2021-08-16 MED ORDER — METHYLPREDNISOLONE SODIUM SUCC 40 MG IJ SOLR
40.0000 mg | Freq: Once | INTRAMUSCULAR | Status: AC
  Administered 2021-08-16: 40 mg via INTRAVENOUS
  Filled 2021-08-16: qty 1

## 2021-08-16 MED ORDER — FOLIC ACID 1 MG PO TABS
1.0000 mg | ORAL_TABLET | Freq: Every day | ORAL | Status: DC
  Administered 2021-08-16 – 2021-08-20 (×5): 1 mg via ORAL
  Filled 2021-08-16 (×6): qty 1

## 2021-08-16 MED ORDER — IOHEXOL 350 MG/ML SOLN
100.0000 mL | Freq: Once | INTRAVENOUS | Status: AC | PRN
  Administered 2021-08-16: 100 mL via INTRAVENOUS

## 2021-08-16 MED ORDER — ONDANSETRON HCL 4 MG/2ML IJ SOLN
4.0000 mg | Freq: Four times a day (QID) | INTRAMUSCULAR | Status: DC | PRN
  Administered 2021-08-18 – 2021-08-21 (×7): 4 mg via INTRAVENOUS
  Filled 2021-08-16 (×7): qty 2

## 2021-08-16 MED ORDER — BENZONATATE 100 MG PO CAPS
200.0000 mg | ORAL_CAPSULE | Freq: Three times a day (TID) | ORAL | Status: DC | PRN
  Administered 2021-08-21: 200 mg via ORAL
  Filled 2021-08-16: qty 2

## 2021-08-16 MED ORDER — MOMETASONE FURO-FORMOTEROL FUM 200-5 MCG/ACT IN AERO
2.0000 | INHALATION_SPRAY | Freq: Two times a day (BID) | RESPIRATORY_TRACT | Status: DC
  Administered 2021-08-16 – 2021-08-21 (×8): 2 via RESPIRATORY_TRACT
  Filled 2021-08-16 (×2): qty 8.8

## 2021-08-16 MED ORDER — SODIUM CHLORIDE 0.9% FLUSH: 10.0000 mL | INTRAVENOUS | Status: DC | PRN

## 2021-08-16 MED ORDER — IPRATROPIUM-ALBUTEROL 0.5-2.5 (3) MG/3ML IN SOLN
3.0000 mL | Freq: Once | RESPIRATORY_TRACT | Status: AC
Start: 2021-08-16 — End: 2021-08-16
  Administered 2021-08-16: 3 mL via RESPIRATORY_TRACT
  Filled 2021-08-16: qty 3

## 2021-08-16 MED ORDER — ASPIRIN 81 MG PO CHEW
324.0000 mg | CHEWABLE_TABLET | Freq: Once | ORAL | Status: AC
  Administered 2021-08-16: 324 mg via ORAL
  Filled 2021-08-16: qty 4

## 2021-08-16 MED ORDER — FENOFIBRATE 160 MG PO TABS
160.0000 mg | ORAL_TABLET | Freq: Every day | ORAL | Status: DC
  Administered 2021-08-16 – 2021-08-20 (×5): 160 mg via ORAL
  Filled 2021-08-16 (×6): qty 1

## 2021-08-16 MED ORDER — SORBITOL 70 % SOLN
960.0000 mL | TOPICAL_OIL | Freq: Once | ORAL | Status: DC | PRN
  Filled 2021-08-16: qty 473

## 2021-08-16 MED ORDER — VANCOMYCIN HCL 1500 MG/300ML IV SOLN
1500.0000 mg | Freq: Once | INTRAVENOUS | Status: AC
  Administered 2021-08-16: 1500 mg via INTRAVENOUS
  Filled 2021-08-16: qty 300

## 2021-08-16 MED ORDER — ACETAMINOPHEN 650 MG RE SUPP: 650.0000 mg | Freq: Four times a day (QID) | RECTAL | Status: DC | PRN

## 2021-08-16 MED ORDER — PREDNISONE 10 MG PO TABS
10.0000 mg | ORAL_TABLET | Freq: Every day | ORAL | Status: DC
  Administered 2021-08-17 – 2021-08-20 (×4): 10 mg via ORAL
  Filled 2021-08-16 (×4): qty 1

## 2021-08-16 MED ORDER — OXYCODONE-ACETAMINOPHEN 5-325 MG PO TABS
1.0000 | ORAL_TABLET | Freq: Four times a day (QID) | ORAL | Status: DC | PRN
  Administered 2021-08-16: 1 via ORAL
  Filled 2021-08-16: qty 1

## 2021-08-16 MED ORDER — POTASSIUM CHLORIDE CRYS ER 20 MEQ PO TBCR
20.0000 meq | EXTENDED_RELEASE_TABLET | Freq: Every day | ORAL | Status: DC
  Administered 2021-08-17 – 2021-08-21 (×5): 20 meq via ORAL
  Filled 2021-08-16 (×5): qty 1

## 2021-08-16 MED ORDER — SENNOSIDES-DOCUSATE SODIUM 8.6-50 MG PO TABS
1.0000 | ORAL_TABLET | Freq: Two times a day (BID) | ORAL | Status: DC
  Administered 2021-08-16 – 2021-08-21 (×11): 1 via ORAL
  Filled 2021-08-16 (×11): qty 1

## 2021-08-16 MED ORDER — ENSURE ENLIVE PO LIQD
237.0000 mL | Freq: Two times a day (BID) | ORAL | Status: DC
  Administered 2021-08-16 – 2021-08-20 (×3): 237 mL via ORAL

## 2021-08-16 MED ORDER — ALBUTEROL SULFATE (2.5 MG/3ML) 0.083% IN NEBU
2.5000 mg | INHALATION_SOLUTION | RESPIRATORY_TRACT | Status: DC | PRN
  Administered 2021-08-19 – 2021-08-20 (×2): 2.5 mg via RESPIRATORY_TRACT
  Filled 2021-08-16 (×3): qty 3

## 2021-08-16 MED ORDER — VANCOMYCIN HCL IN DEXTROSE 1-5 GM/200ML-% IV SOLN
1000.0000 mg | Freq: Two times a day (BID) | INTRAVENOUS | Status: DC
  Administered 2021-08-16 – 2021-08-17 (×2): 1000 mg via INTRAVENOUS
  Filled 2021-08-16 (×2): qty 200

## 2021-08-16 MED ORDER — SODIUM CHLORIDE 0.9 % IV SOLN
2.0000 g | Freq: Three times a day (TID) | INTRAVENOUS | Status: DC
  Administered 2021-08-16 – 2021-08-21 (×16): 2 g via INTRAVENOUS
  Filled 2021-08-16 (×15): qty 12.5

## 2021-08-16 MED ORDER — MAGNESIUM HYDROXIDE 400 MG/5ML PO SUSP
30.0000 mL | Freq: Once | ORAL | Status: AC
  Administered 2021-08-16: 30 mL via ORAL
  Filled 2021-08-16: qty 30

## 2021-08-16 MED ORDER — ONDANSETRON HCL 4 MG PO TABS: 4.0000 mg | ORAL_TABLET | Freq: Four times a day (QID) | ORAL | Status: DC | PRN

## 2021-08-16 MED ORDER — DM-GUAIFENESIN ER 30-600 MG PO TB12
1.0000 | ORAL_TABLET | Freq: Two times a day (BID) | ORAL | Status: DC
  Administered 2021-08-16 – 2021-08-20 (×10): 1 via ORAL
  Filled 2021-08-16 (×11): qty 1

## 2021-08-16 MED ORDER — SORBITOL 70 % SOLN
960.0000 mL | TOPICAL_OIL | Freq: Once | ORAL | Status: DC
  Filled 2021-08-16: qty 473

## 2021-08-16 MED ORDER — SODIUM CHLORIDE 0.9 % IV SOLN
2.0000 g | Freq: Once | INTRAVENOUS | Status: AC
  Administered 2021-08-16: 2 g via INTRAVENOUS
  Filled 2021-08-16: qty 12.5

## 2021-08-16 NOTE — ED Provider Notes (Signed)
?[facility] DEPT ?Provider Note ? ? ?CSN: [id number] ?Arrival date & time: 08/03/2021  6644 ? ?  ? ?History ? ?Chief Complaint  ?Patient presents with  ? Shortness of Breath  ? ? ?Richard Li is a 61 y.o. male. ? ? Patient as above with significant medical history as below, including COPD, lung cancer, asthma, hypertension who presents to the ED with complaint of dyspnea, cough, low-grade fever, chills, malaise, myalgias.  Patient was recently admitted with the same.  Underwent thoracentesis, broad-spectrum antibiotics.  Symptoms improved.  He was discharged.  Patient felt better for about 1 to 2 days then symptoms returned.  Been having worsening exertional dyspnea, cough with green sputum, chills, body aches. Chest pain, tightness.  Difficulty catching his breath.  Also constipated x1 week.  He has been compliant with his home lactulose. ? 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 Julien Nordmann ?During recent mission he had identified large left-sided pleural effusion, status postthoracentesis with drainage. ? ? ?Past Medical History: ?No date: Allergic rhinitis, cause unspecified ?No date: Anxiety state, unspecified ?No date: Asthma ?    Comment:  as a child ?No date: Chronic airway obstruction, not elsewhere classified ?No date: Esophageal reflux ?No date: History of kidney stones ?02/13/2020-03/24/2020: History of radiation therapy ?    Comment:  right lung        Dr Gery Pray ?No date: Hypertension ?No date: Lumbago ?dx'd 12/2019: lung ca ?No date: Other and unspecified hyperlipidemia ?No date: Other chest pain ? ?Past Surgical History: ?No date: APPENDECTOMY ?01/07/2020: BRONCHIAL BIOPSY ?    Comment:  Procedure: BRONCHIAL BIOPSIES;  Surgeon: Baltazar Apo  ?             S, MD;  Location: [place];  Service: Pulmonary;; ?04/05/2020: BRONCHIAL BIOPSY ?    Comment:  Procedure: BRONCHIAL BIOPSIES;  Surgeon: Rigoberto Noel, ?             MD;  Location: [facility]  ENDOSCOPY;  Service: Cardiopulmonary;; ?01/07/2020: BRONCHIAL BRUSHINGS ?    Comment:  Procedure: BRONCHIAL BRUSHINGS;  Surgeon: Baltazar Apo  ?             S, MD;  Location: [place];  Service: Pulmonary;; ?01/07/2020: BRONCHIAL NEEDLE ASPIRATION BIOPSY ?    Comment:  Procedure: BRONCHIAL NEEDLE ASPIRATION BIOPSIES;   ?             Surgeon: Collene Gobble, MD;  Location: [facility] ENDOSCOPY;   ?             Service: Pulmonary;; ?01/07/2020: BRONCHIAL WASHINGS ?    Comment:  Procedure: BRONCHIAL WASHINGS;  Surgeon: Baltazar Apo  ?             S, MD;  Location: [place];  Service: Pulmonary;; ?04/05/2020: BRONCHIAL WASHINGS ?    Comment:  Procedure: BRONCHIAL WASHINGS;  Surgeon: Rigoberto Noel, ?             MD;  Location: [place] [facility] ENDOSCOPY;  Service: Cardiopulmonary;; ?15 years ago: COLONOSCOPY ?01/07/2020: HEMOSTASIS CONTROL ?    Comment:  Procedure: HEMOSTASIS CONTROL;  Surgeon: Baltazar Apo  ?             S, MD;  Location: [facility];  Service: Pulmonary;;   ?             cold saline ?09/18/2020: IR IMAGING GUIDED PORT INSERTION ?2002: NASAL TURBINATE REDUCTION ?    Comment:  Dr.Crossley ?05/28/2021: THORACENTESIS;  N/A ?    Comment:  Procedure: THORACENTESIS;  Surgeon: Lanier Clam, MD;  Location: [facility] ENDOSCOPY;  Service: Pulmonary;   ?             Laterality: N/A; ?No date: VASECTOMY ?04/05/2020: VIDEO BRONCHOSCOPY; Right ?    Comment:  Procedure: VIDEO BRONCHOSCOPY WITH FLUORO;  Surgeon:  ?             Rigoberto Noel, MD;  Location: [place];  Service:  ?             Cardiopulmonary;  Laterality: Right; ?01/07/2020: VIDEO BRONCHOSCOPY WITH ENDOBRONCHIAL NAVIGATION; N/A ?    Comment:  Procedure: VIDEO BRONCHOSCOPY WITH ENDOBRONCHIAL  ?             NAVIGATION;  Surgeon: Collene Gobble, MD;  Location: [facility]  ?             ENDOSCOPY;  Service: Pulmonary;  Laterality: N/A; ?01/07/2020: VIDEO BRONCHOSCOPY WITH ENDOBRONCHIAL ULTRASOUND; N/A ?    Comment:  Procedure: VIDEO BRONCHOSCOPY WITH  ENDOBRONCHIAL  ?             ULTRASOUND;  Surgeon: Collene Gobble, MD;  Location: [facility]  ?             ENDOSCOPY;  Service: Pulmonary;  Laterality: N/A;  ? ? ?The history is provided by the patient. No language interpreter was used.  ?Shortness of Breath ?Associated symptoms: chest pain, cough and fever   ?Associated symptoms: no abdominal pain, no headaches, no rash and no vomiting   ? ?  ? ?Home Medications ?Prior to Admission medications   ?Medication Sig Start Date End Date Taking? Authorizing Provider  ?acetaminophen (TYLENOL) 500 MG tablet Take 1,000 mg by mouth every 8 (eight) hours as needed for moderate pain or mild pain.    [provider]  ?budesonide-formoterol (SYMBICORT) 160-4.5 MCG/ACT inhaler Inhale 2 puffs into the lungs 2 (two) times daily. 08/12/21   Aline August, MD  ?cefUROXime (CEFTIN) 500 MG tablet Take 1 tablet (500 mg total) by mouth 2 (two) times daily for 5 days. 08/12/21 08/17/21  Aline August, MD  ?dextromethorphan-guaiFENesin (MUCINEX DM) 30-600 MG 12hr tablet Take 1 tablet by mouth 2 (two) times daily.    [provider]  ?feeding supplement (ENSURE ENLIVE / ENSURE PLUS) LIQD Take 237 mLs by mouth 2 (two) times daily between meals. 08/12/21   Aline August, MD  ?fenofibrate 160 MG tablet Take 1 tablet by mouth once daily ?Patient taking differently: Take 160 mg by mouth daily. 05/17/21   [name], DO  ?ferrous sulfate 325 (65 FE) MG tablet Take 1 tablet (325 mg total) by mouth daily with breakfast. ?Patient not taking: Reported on 08/10/2021 05/12/20   Kerney Elbe, DO  ?folic acid (FOLVITE) 1 MG tablet Take 1 tablet by mouth once daily ?Patient taking differently: Take 1 mg by mouth daily. 07/21/21   Curt Bears, MD  ?Ipratropium-Albuterol (COMBIVENT RESPIMAT) 20-100 MCG/ACT AERS respimat INHALE 1 TO 2 PUFFS BY MOUTH EVERY 6 HOURS AS NEEDED FOR WHEEZING ?Patient taking differently: Inhale 1-2 puffs into the lungs every 6 (six) hours as needed for  wheezing. 07/01/21   Hunsucker, Bonna Gains, MD  ?ipratropium-albuterol (DUONEB) 0.5-2.5 (3) MG/3ML SOLN Take 3 mLs by nebulization every 4 (four) hours as needed (wheezing/shortness of breath). 08/12/21   Aline August, MD  ?lactulose ([place]) 10 GM/15ML solution  TAKE 15 MLS BY MOUTH 3 TIMES DAILY Strength: 10 GM/15ML ?Patient taking differently: Take 15 g by mouth daily as needed for mild constipation. 06/03/21   Heilingoetter, Cassandra L, PA-C  ?lidocaine-prilocaine (EMLA) cream Apply 1 application topically as needed. ?Patient taking differently: Apply 1 application. topically daily as needed (port site access). 09/30/20   Heilingoetter, Cassandra L, PA-C  ?LORazepam (ATIVAN) 1 MG tablet Take 1 tablet (1 mg total) by mouth every 8 (eight) hours as needed for anxiety. 06/18/21   [name], DO  ?Multiple Vitamin (MULTIVITAMIN WITH MINERALS) TABS tablet Take 1 tablet by mouth daily. 03/18/20   Mercy Riding, MD  ?oxyCODONE-acetaminophen (PERCOCET/ROXICET) 5-325 MG tablet Take 1 tablet by mouth every 6 (six) hours as needed for severe pain. 08/05/21   Heilingoetter, Cassandra L, PA-C  ?potassium chloride SA (KLOR-CON M) 20 MEQ tablet Take 1 tablet (20 mEq total) by mouth daily. 08/05/21   Heilingoetter, Cassandra L, PA-C  ?predniSONE (DELTASONE) 10 MG tablet Prednisone 40 mg daily for 7 days then switch back to 10 mg daily 08/13/21   Aline August, MD  ?prochlorperazine (COMPAZINE) 10 MG tablet Take 1 tablet (10 mg total) by mouth every 6 (six) hours as needed. ?Patient taking differently: Take 10 mg by mouth every 6 (six) hours as needed for nausea or vomiting. 09/14/20   Heilingoetter, Cassandra L, PA-C  ?senna-docusate (SENOKOT-S) 8.6-50 MG tablet Take 1 tablet by mouth 2 (two) times daily. 08/12/21   Aline August, MD  ?sucralfate (CARAFATE) 1 g tablet Take 1 tablet (1 g total) by mouth 4 (four) times daily -  with meals and at bedtime. ?Patient not taking: Reported on 03/05/2020 03/02/20 03/17/20  Curt Bears,  MD  ?   ? ?Allergies    ?Penicillins and Amoxicillin   ? ?Review of Systems   ?Review of Systems  ?Constitutional:  Positive for chills and fever.  ?HENT:  Negative for facial swelling and trouble swallowing.   ?Eyes:

## 2021-08-16 NOTE — Telephone Encounter (Signed)
Transition Care Management Unsuccessful Follow-up Telephone Call ? ?Date of discharge and from where:  08/12/21 from [facility] ? ?Attempts:  2nd Attempt ? ?Reason for unsuccessful TCM follow-up call:  No answer/busy Patient is back in the hospital. ? ?  ?

## 2021-08-16 NOTE — Progress Notes (Signed)
A consult was received from an ED physician for vancomycin and cefepime per pharmacy dosing.  The patient's profile has been reviewed for ht/wt/allergies/indication/available labs.   ?A one time order has been placed for vancomycin 1500mg  and cefepime 2g.  Further antibiotics/pharmacy consults should be ordered by admitting physician if indicated.       ?                ?Thank you, ?Peggyann Juba, PharmD, BCPS ?07/26/2021  8:08 AM ? ?

## 2021-08-16 NOTE — H&P (Signed)
?History and Physical  ? ? ?Patient: Richard Li JKK:[id number] DOB: 04/06/1961 ?DOA: 08/04/2021 ?DOS: the patient was seen and examined on 08/15/2021 ?PCP: Richard Nutting, DO  ?Patient coming from: Home ? ?Chief Complaint:  ?Chief Complaint  ?Patient presents with  ? Shortness of Breath  ? ?HPI: Richard Li is a 61 y.o. male with medical history significant of allergic rhinitis, anxiety, asthma as a child, chronic airway obstruction, GERD, nephrolithiasis, hyperlipidemia, stage IV lung cancer with brain mets on chemotherapy with recent recurrent pleural effusion who was recently admitted from 08/10/2021 until 08/12/2021 due to dyspnea in the setting of lung cancer, pleural effusion and pneumonia.  He underwent an imaging guided left thoracentesis that yielded 1.7 L of hazy amber fluid.  He is returning today with worsening dyspnea, low-grade temperature, fatigue, generalized weakness, pleuritic chest pain, and palpitations for the past week and a half.  He has occasional orthopnea.  He has chronic lower extremity edema. He has been eating, but his appetite has not been good as he has been constipated for a week.  He has had some mild nausea, but denied vomiting, melena or hematochezia.  No flank pain, dysuria, frequency or hematuria.  No polyuria, polydipsia, polyphagia or blurred vision. ? ?ED course: Initial vital signs were temperature 98.4 ?F, pulse 122, respirations 22, BP 106/66 mmHg and O2 sat 89% on room air.  Patient was placed on oxygen at 2 LPM, received aspirin 324 mg p.o. x1, DuoNeb, cefepime and vancomycin.  I added MOM 30 mL and KCl 40 mEq orally. ? ?Lab work: CBC showed a white count 3.5, hemoglobin 7.4 g/dL platelets 93.  CMP showed potassium 3.4 mmol/L, all other electrolytes were normal.  Glucose 111 mg/dL.  Total protein 5.9, albumin 2.2 g/dL.  ALT and bilirubin were normal, AST was 47 and alkaline phosphatase 152 units/L. ? ?Imaging: Portable 1 view chest radiograph showed bilateral  pleural effusions, left probably partial loculation.  Interstitial and hazy opacities again noted in the right lower lung field, pneumonia versus edema or combination versus lymphatic carcinomatosis.  There is increased hazy interstitial consolidation of the left lower lung field.  CTA chest showed no PE.  There were posttreatment changes in the right upper lobe.  Progressive airspace disease involving the lower lobes bilaterally and lingula, consistent with pneumonia.  There is a slight increase in the size of the large left pleural effusion.  Stable T12 compression fracture, BPH, aortic atherosclerosis and mild nonspecific mesenteric edema.  Please see images and full radiology report for further details. ? ? ?Review of Systems: As mentioned in the history of present illness. All other systems reviewed and are negative. ?Past Medical History:  ?Diagnosis Date  ? Allergic rhinitis, cause unspecified   ? Anxiety state, unspecified   ? Asthma   ? as a child  ? Chronic airway obstruction, not elsewhere classified   ? Esophageal reflux   ? History of kidney stones   ? History of radiation therapy 02/13/2020-03/24/2020  ? right lung        Dr Gery Pray  ? Hypertension   ? Lumbago   ? lung ca dx'd 12/2019  ? Other and unspecified hyperlipidemia   ? Other chest pain   ? ?Past Surgical History:  ?Procedure Laterality Date  ? APPENDECTOMY    ? BRONCHIAL BIOPSY  01/07/2020  ? Procedure: BRONCHIAL BIOPSIES;  Surgeon: Collene Gobble, MD;  Location: [facility] ENDOSCOPY;  Service: Pulmonary;;  ? BRONCHIAL BIOPSY  04/05/2020  ? Procedure:  BRONCHIAL BIOPSIES;  Surgeon: Rigoberto Noel, MD;  Location: [place];  Service: Cardiopulmonary;;  ? BRONCHIAL BRUSHINGS  01/07/2020  ? Procedure: BRONCHIAL BRUSHINGS;  Surgeon: Collene Gobble, MD;  Location: [facility] ENDOSCOPY;  Service: Pulmonary;;  ? BRONCHIAL NEEDLE ASPIRATION BIOPSY  01/07/2020  ? Procedure: BRONCHIAL NEEDLE ASPIRATION BIOPSIES;  Surgeon: Collene Gobble, MD;  Location: [facility]  ENDOSCOPY;  Service: Pulmonary;;  ? BRONCHIAL WASHINGS  01/07/2020  ? Procedure: BRONCHIAL WASHINGS;  Surgeon: Collene Gobble, MD;  Location: [facility] ENDOSCOPY;  Service: Pulmonary;;  ? BRONCHIAL WASHINGS  04/05/2020  ? Procedure: BRONCHIAL WASHINGS;  Surgeon: Rigoberto Noel, MD;  Location: [facility] ENDOSCOPY;  Service: Cardiopulmonary;;  ? COLONOSCOPY  15 years ago  ? HEMOSTASIS CONTROL  01/07/2020  ? Procedure: HEMOSTASIS CONTROL;  Surgeon: Collene Gobble, MD;  Location: [facility] ENDOSCOPY;  Service: Pulmonary;;  cold saline  ? IR IMAGING GUIDED PORT INSERTION  09/18/2020  ? NASAL TURBINATE REDUCTION  2002  ? Dr.Crossley  ? THORACENTESIS N/A 05/28/2021  ? Procedure: THORACENTESIS;  Surgeon: Lanier Clam, MD;  Location: [facility] ENDOSCOPY;  Service: Pulmonary;  Laterality: N/A;  ? VASECTOMY    ? VIDEO BRONCHOSCOPY Right 04/05/2020  ? Procedure: VIDEO BRONCHOSCOPY WITH FLUORO;  Surgeon: Rigoberto Noel, MD;  Location: [place];  Service: Cardiopulmonary;  Laterality: Right;  ? VIDEO BRONCHOSCOPY WITH ENDOBRONCHIAL NAVIGATION N/A 01/07/2020  ? Procedure: VIDEO BRONCHOSCOPY WITH ENDOBRONCHIAL NAVIGATION;  Surgeon: Collene Gobble, MD;  Location: [facility] ENDOSCOPY;  Service: Pulmonary;  Laterality: N/A;  ? VIDEO BRONCHOSCOPY WITH ENDOBRONCHIAL ULTRASOUND N/A 01/07/2020  ? Procedure: VIDEO BRONCHOSCOPY WITH ENDOBRONCHIAL ULTRASOUND;  Surgeon: Collene Gobble, MD;  Location: [facility] ENDOSCOPY;  Service: Pulmonary;  Laterality: N/A;  ? ?Social History:  reports that he quit smoking about 16 years ago. His smoking use included cigarettes. He has a 56.00 pack-year smoking history. He has never used smokeless tobacco. He reports that he does not currently use alcohol after a past usage of about 12.0 standard drinks per week. He reports that he does not use drugs. ? ?Allergies  ?Allergen Reactions  ? Penicillins   ?  Unknown reaction  ? Amoxicillin Rash  ? ? ?Family History  ?Problem Relation Age of Onset  ? Breast cancer Mother   ? Cancer Father   ?  Heart attack Brother   ? Emphysema Maternal Uncle   ? COPD Maternal Uncle   ? Heart disease Maternal Uncle   ? Colon cancer Neg Hx   ? ? ?Prior to Admission medications   ?Medication Sig Start Date End Date Taking? Authorizing Provider  ?acetaminophen (TYLENOL) 500 MG tablet Take 1,000 mg by mouth every 8 (eight) hours as needed for moderate pain or mild pain.    [provider]  ?budesonide-formoterol (SYMBICORT) 160-4.5 MCG/ACT inhaler Inhale 2 puffs into the lungs 2 (two) times daily. 08/12/21   Aline August, MD  ?cefUROXime (CEFTIN) 500 MG tablet Take 1 tablet (500 mg total) by mouth 2 (two) times daily for 5 days. 08/12/21 08/17/21  Aline August, MD  ?dextromethorphan-guaiFENesin (MUCINEX DM) 30-600 MG 12hr tablet Take 1 tablet by mouth 2 (two) times daily.    [provider]  ?feeding supplement (ENSURE ENLIVE / ENSURE PLUS) LIQD Take 237 mLs by mouth 2 (two) times daily between meals. 08/12/21   Aline August, MD  ?fenofibrate 160 MG tablet Take 1 tablet by mouth once daily ?Patient taking differently: Take 160 mg by mouth daily. 05/17/21   Richard Nutting, DO  ?  ferrous sulfate 325 (65 FE) MG tablet Take 1 tablet (325 mg total) by mouth daily with breakfast. ?Patient not taking: Reported on 08/10/2021 05/12/20   Kerney Elbe, DO  ?folic acid (FOLVITE) 1 MG tablet Take 1 tablet by mouth once daily ?Patient taking differently: Take 1 mg by mouth daily. 07/21/21   Curt Bears, MD  ?Ipratropium-Albuterol (COMBIVENT RESPIMAT) 20-100 MCG/ACT AERS respimat INHALE 1 TO 2 PUFFS BY MOUTH EVERY 6 HOURS AS NEEDED FOR WHEEZING ?Patient taking differently: Inhale 1-2 puffs into the lungs every 6 (six) hours as needed for wheezing. 07/01/21   Hunsucker, Bonna Gains, MD  ?ipratropium-albuterol (DUONEB) 0.5-2.5 (3) MG/3ML SOLN Inhale 1 vial via nebulizer every 4 (four) hours as needed (wheezing/shortness of breath). 08/12/21   Aline August, MD  ?lactulose (CHRONULAC) 10 GM/15ML solution TAKE 15 MLS BY  MOUTH 3 TIMES DAILY Strength: 10 GM/15ML ?Patient taking differently: Take 15 g by mouth daily as needed for mild constipation. 06/03/21   Heilingoetter, Cassandra L, PA-C  ?lidocaine-prilocaine (EMLA) cream Apply 1 a

## 2021-08-16 NOTE — Progress Notes (Addendum)
Pharmacy Antibiotic Note ? ?Richard Li is a 61 y.o. male admitted on 07/29/2021 with pneumonia.  Pharmacy has been consulted for vancomycin dosing. ? ?Plan: ?Vancomycin 1500 mg IV now, then 1000 mg IV q12 hr (est AUC 421 based on SCr 0.8; Vd 0.72) ?Measure vancomycin AUC at steady state as indicated ?SCr q48 while on vanc ?MRSA PCR ordered; f/u and narrow vanc as appropriate ?Cefepime 2g IV q8 hr per MD; dosing appropriate ? ? ?Height: 5\' 9"  (175.3 cm) ?Weight: 77.1 kg (170 lb) ?IBW/kg (Calculated) : 70.7 ? ?Temp (24hrs), Avg:98.1 ?F (36.7 ?C), Min:97.7 ?F (36.5 ?C), Max:98.4 ?F (36.9 ?C) ? ?Recent Labs  ?Lab 08/10/21 ?1124 08/11/21 ?4496 08/11/21 ?7591 08/12/21 ?0434 07/28/2021 ?0749  ?WBC 10.4 6.3  --  3.2*  --   ?CREATININE 1.00 0.92  --  0.80 0.72  ?LATICACIDVEN 2.5* 0.7 1.1  --   --   ?  ?Estimated Creatinine Clearance: 98.2 mL/min (by C-G formula based on SCr of 0.72 mg/dL).   ? ?Allergies  ?Allergen Reactions  ? Penicillins   ?  Unknown reaction  ? Amoxicillin Rash  ? ? ?Antimicrobials this admission: ?4/24 vancomycin >>  ?4/24 cefepime >>  ? ?Dose adjustments this admission: n/a ? ?Microbiology results: ?BCx, AFB from 4/18 admission all negative ?4/24 MRSA PCR: ordered ? ? ?Thank you for allowing pharmacy to be a part of this patient?s care. ? ?Jimi Giza A ?08/14/2021 12:55 PM ? ?

## 2021-08-16 NOTE — ED Triage Notes (Signed)
Pt came in with worsening SOB. Seen here 6 days ago for same. Pt RA sats are 88%. On 2L currently. Tachypneic. At 27-30. HR irregular with rate 90-120. Pt hx of lung cancer and COPD. Does not have O2 at home. Coughing up copious green sputum. Constipation for 7 days ?

## 2021-08-16 NOTE — Progress Notes (Signed)
No bipap needed at this time. Patient in no distress at this time.  ?

## 2021-08-17 ENCOUNTER — Telehealth: Payer: Self-pay | Admitting: Primary Care

## 2021-08-17 ENCOUNTER — Other Ambulatory Visit (HOSPITAL_COMMUNITY): Payer: Self-pay

## 2021-08-17 DIAGNOSIS — K5900 Constipation, unspecified: Secondary | ICD-10-CM | POA: Diagnosis present

## 2021-08-17 DIAGNOSIS — I10 Essential (primary) hypertension: Secondary | ICD-10-CM

## 2021-08-17 DIAGNOSIS — J9601 Acute respiratory failure with hypoxia: Secondary | ICD-10-CM | POA: Diagnosis present

## 2021-08-17 DIAGNOSIS — J910 Malignant pleural effusion: Secondary | ICD-10-CM | POA: Diagnosis present

## 2021-08-17 DIAGNOSIS — J90 Pleural effusion, not elsewhere classified: Secondary | ICD-10-CM | POA: Diagnosis not present

## 2021-08-17 DIAGNOSIS — T451X5A Adverse effect of antineoplastic and immunosuppressive drugs, initial encounter: Secondary | ICD-10-CM | POA: Diagnosis present

## 2021-08-17 DIAGNOSIS — J189 Pneumonia, unspecified organism: Secondary | ICD-10-CM | POA: Diagnosis present

## 2021-08-17 DIAGNOSIS — Z20822 Contact with and (suspected) exposure to covid-19: Secondary | ICD-10-CM | POA: Diagnosis present

## 2021-08-17 DIAGNOSIS — J939 Pneumothorax, unspecified: Secondary | ICD-10-CM | POA: Diagnosis not present

## 2021-08-17 DIAGNOSIS — C7931 Secondary malignant neoplasm of brain: Secondary | ICD-10-CM | POA: Diagnosis present

## 2021-08-17 DIAGNOSIS — G9341 Metabolic encephalopathy: Secondary | ICD-10-CM | POA: Diagnosis not present

## 2021-08-17 DIAGNOSIS — D849 Immunodeficiency, unspecified: Secondary | ICD-10-CM | POA: Diagnosis present

## 2021-08-17 DIAGNOSIS — J188 Other pneumonia, unspecified organism: Secondary | ICD-10-CM | POA: Diagnosis present

## 2021-08-17 DIAGNOSIS — E876 Hypokalemia: Secondary | ICD-10-CM

## 2021-08-17 DIAGNOSIS — Z66 Do not resuscitate: Secondary | ICD-10-CM | POA: Diagnosis present

## 2021-08-17 DIAGNOSIS — E785 Hyperlipidemia, unspecified: Secondary | ICD-10-CM | POA: Diagnosis present

## 2021-08-17 DIAGNOSIS — D61818 Other pancytopenia: Secondary | ICD-10-CM | POA: Diagnosis not present

## 2021-08-17 DIAGNOSIS — J449 Chronic obstructive pulmonary disease, unspecified: Secondary | ICD-10-CM | POA: Diagnosis present

## 2021-08-17 DIAGNOSIS — D509 Iron deficiency anemia, unspecified: Secondary | ICD-10-CM | POA: Diagnosis present

## 2021-08-17 DIAGNOSIS — Z515 Encounter for palliative care: Secondary | ICD-10-CM | POA: Diagnosis not present

## 2021-08-17 DIAGNOSIS — C3491 Malignant neoplasm of unspecified part of right bronchus or lung: Secondary | ICD-10-CM | POA: Diagnosis present

## 2021-08-17 DIAGNOSIS — I248 Other forms of acute ischemic heart disease: Secondary | ICD-10-CM | POA: Diagnosis present

## 2021-08-17 DIAGNOSIS — C7901 Secondary malignant neoplasm of right kidney and renal pelvis: Secondary | ICD-10-CM | POA: Diagnosis present

## 2021-08-17 DIAGNOSIS — J702 Acute drug-induced interstitial lung disorders: Secondary | ICD-10-CM | POA: Diagnosis present

## 2021-08-17 DIAGNOSIS — D61810 Antineoplastic chemotherapy induced pancytopenia: Secondary | ICD-10-CM | POA: Diagnosis present

## 2021-08-17 DIAGNOSIS — F411 Generalized anxiety disorder: Secondary | ICD-10-CM | POA: Diagnosis present

## 2021-08-17 LAB — COMPREHENSIVE METABOLIC PANEL
ALT: 28 U/L (ref 0–44)
AST: 37 U/L (ref 15–41)
Albumin: 2.1 g/dL — ABNORMAL LOW (ref 3.5–5.0)
Alkaline Phosphatase: 137 U/L — ABNORMAL HIGH (ref 38–126)
Anion gap: 4 — ABNORMAL LOW (ref 5–15)
BUN: 13 mg/dL (ref 6–20)
CO2: 29 mmol/L (ref 22–32)
Calcium: 8.7 mg/dL — ABNORMAL LOW (ref 8.9–10.3)
Chloride: 105 mmol/L (ref 98–111)
Creatinine, Ser: 0.74 mg/dL (ref 0.61–1.24)
GFR, Estimated: >60 mL/min (ref >60)
Glucose, Bld: 116 mg/dL — ABNORMAL HIGH (ref 70–99)
Potassium: 4.4 mmol/L (ref 3.5–5.1)
Sodium: 138 mmol/L (ref 135–145)
Total Bilirubin: 0.8 mg/dL (ref 0.3–1.2)
Total Protein: 5.5 g/dL — ABNORMAL LOW (ref 6.5–8.1)

## 2021-08-17 LAB — CBC
HCT: 20.7 % — ABNORMAL LOW (ref 39.0–52.0)
Hemoglobin: 6.9 g/dL — CL (ref 13.0–17.0)
MCH: 34.2 pg — ABNORMAL HIGH (ref 26.0–34.0)
MCHC: 33.3 g/dL (ref 30.0–36.0)
MCV: 102.5 fL — ABNORMAL HIGH (ref 80.0–100.0)
Platelets: 93 10*3/uL — ABNORMAL LOW (ref 150–400)
RBC: 2.02 MIL/uL — ABNORMAL LOW (ref 4.22–5.81)
RDW: 15.9 % — ABNORMAL HIGH (ref 11.5–15.5)
WBC: 3.9 10*3/uL — ABNORMAL LOW (ref 4.0–10.5)
nRBC: 1 % — ABNORMAL HIGH (ref 0.0–0.2)

## 2021-08-17 LAB — HEMOGLOBIN AND HEMATOCRIT, BLOOD
HCT: 27.1 % — ABNORMAL LOW (ref 39.0–52.0)
Hemoglobin: 9.3 g/dL — ABNORMAL LOW (ref 13.0–17.0)

## 2021-08-17 LAB — PREPARE RBC (CROSSMATCH)

## 2021-08-17 LAB — PHOSPHORUS: Phosphorus: 2.8 mg/dL (ref 2.5–4.6)

## 2021-08-17 LAB — MAGNESIUM: Magnesium: 1.5 mg/dL — ABNORMAL LOW (ref 1.7–2.4)

## 2021-08-17 MED ORDER — HYDROMORPHONE HCL 1 MG/ML IJ SOLN
0.5000 mg | Freq: Once | INTRAMUSCULAR | Status: AC
  Administered 2021-08-17: 0.5 mg via INTRAVENOUS
  Filled 2021-08-17: qty 0.5

## 2021-08-17 MED ORDER — SODIUM CHLORIDE 0.9% IV SOLUTION: Freq: Once | INTRAVENOUS | Status: DC

## 2021-08-17 MED ORDER — SODIUM CHLORIDE 0.9% IV SOLUTION: Freq: Once | INTRAVENOUS | Status: AC

## 2021-08-17 MED ORDER — POLYETHYLENE GLYCOL 3350 17 G PO PACK
17.0000 g | PACK | Freq: Two times a day (BID) | ORAL | Status: AC
Start: 2021-08-17 — End: 2021-08-18
  Filled 2021-08-17 (×3): qty 1

## 2021-08-17 MED ORDER — MAGNESIUM SULFATE 2 GM/50ML IV SOLN
2.0000 g | Freq: Once | INTRAVENOUS | Status: AC
  Administered 2021-08-17: 2 g via INTRAVENOUS
  Filled 2021-08-17: qty 50

## 2021-08-17 NOTE — Telephone Encounter (Signed)
Transition Care Management Unsuccessful Follow-up Telephone Call ? ?Date of discharge and from where:  08/12/21 from [facility] long hospital ? ?Attempts:  3rd Attempt ? ?Reason for unsuccessful TCM follow-up call:  No answer/busy Patient is back in the hospital ? ?  ?

## 2021-08-17 NOTE — TOC Initial Note (Signed)
Transition of Care (TOC) - Initial/Assessment Note  ? ? ?Patient Details  ?Name: Richard Li ?MRN: [id number] ?Date of Birth: 12/13/1960 ? ?Transition of Care [facility]) CM/SW Contact:    ?Leeroy Cha, RN ?Phone Number: ?08/17/2021, 11:15 AM ? ?Clinical Narrative:                 ? ?Transition of Care (TOC) Screening Note ? ? ?Patient Details  ?Name: Richard Li ?Date of Birth: 1960/06/09 ? ? ?Transition of Care [facility]) CM/SW Contact:    ?Leeroy Cha, RN ?Phone Number: ?08/17/2021, 11:15 AM ? ? ? ?Transition of Care Department [facility]) has reviewed patient and no TOC needs have been identified at this time. We will continue to monitor patient advancement through interdisciplinary progression rounds. If new patient transition needs arise, please place a TOC consult. ? ? ? ?Expected Discharge Plan: Home/Self Care ?Barriers to Discharge: No Barriers Identified ? ? ?Patient Goals and CMS Choice ?Patient states their goals for this hospitalization and ongoing recovery are:: to go home ?CMS Medicare.gov Compare Post Acute Care list provided to:: Patient ?  ? ?Expected Discharge Plan and Services ?Expected Discharge Plan: Home/Self Care ?  ?Discharge Planning Services: CM Consult ?  ?Living arrangements for the past 2 months: [place] ?                ?  ?  ?  ?  ?  ?  ?  ?  ?  ?  ? ?Prior Living Arrangements/Services ?Living arrangements for the past 2 months: [place] ?Lives with:: Spouse ?Patient language and need for interpreter reviewed:: Yes ?Do you feel safe going back to the place where you live?: Yes      ?  ?  ?  ?Criminal Activity/Legal Involvement Pertinent to Current Situation/Hospitalization: No - Comment as needed ? ?Activities of Daily Living ?Home Assistive Devices/Equipment: None ?ADL Screening (condition at time of admission) ?Patient's cognitive ability adequate to safely complete daily activities?: Yes ?Is the patient deaf or have difficulty hearing?: No ?Does the patient  have difficulty seeing, even when wearing glasses/contacts?: No ?Does the patient have difficulty concentrating, remembering, or making decisions?: No ?Patient able to express need for assistance with ADLs?: Yes ?Does the patient have difficulty dressing or bathing?: No ?Independently performs ADLs?: Yes (appropriate for developmental age) ?Does the patient have difficulty walking or climbing stairs?: Yes ?Weakness of Legs: Both ?Weakness of Arms/Hands: None ? ?Permission Sought/Granted ?  ?  ?   ?   ?   ?   ? ?Emotional Assessment ?Appearance:: Appears stated age ?Attitude/Demeanor/Rapport: Other (comment) ?Affect (typically observed): Accepting ?Orientation: : Oriented to Self, Oriented to Place, Oriented to  Time, Oriented to Situation ?Alcohol / Substance Use: Not Applicable ?Psych Involvement: No (comment) ? ?Admission diagnosis:  Elevated troponin [R77.8] ?Pleural effusion, left [J90] ?Acute respiratory failure with hypoxia ([place]) [J96.01] ?Community acquired pneumonia, unspecified laterality [J18.9] ?Lung cancer metastatic to brain ([place]) [C34.90, C79.31] ?Multifocal pneumonia [J18.9] ?Acute hypoxemic respiratory failure ([place]) [J96.01] ?Patient Active Problem List  ? Diagnosis Date Noted  ? Multifocal pneumonia 08/17/2021  ? Acute hypoxemic respiratory failure ([place]) 08/17/2021  ? Acute respiratory failure with hypoxia ([place]) 08/11/2021  ? Hypophosphatemia 08/15/2021  ? Hypomagnesemia 07/28/2021  ? Hypokalemia 07/30/2021  ? HCAP (healthcare-associated pneumonia) 08/06/2021  ? Pancytopenia ([place]) 08/14/2021  ? Pneumonia 08/11/2021  ? Shortness of breath 08/10/2021  ? Lactic acidosis 08/10/2021  ? Decreased oral intake 08/10/2021  ? Sinus tachycardia 08/10/2021  ?  Nausea, dry heaving and abdominal pain 08/10/2021  ? Recurrent left pleural effusion 07/15/2021  ? 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 Brain metastases 10/07/2020  ? Constipation 09/30/2020  ? Goals of care, counseling/discussion 09/14/2020  ?  Cancer related pain 09/14/2020  ? Encounter for antineoplastic immunotherapy 09/14/2020  ? Adenocarcinoma of right lung, stage 4 ([place]) 08/28/2020  ? Iron deficiency anemia   ? Leukocytosis   ? Hypotension   ? Recurrent pneumonia 05/03/2020  ? Protein-calorie malnutrition, severe 04/07/2020  ? Anemia of chronic disease 03/20/2020  ? Volume overload 03/20/2020  ? Sepsis ([place]) 03/13/2020  ? SOB (shortness of breath) 03/06/2020  ? Generalized weakness 03/06/2020  ? Dehydration 03/05/2020  ? Encounter for antineoplastic chemotherapy 01/30/2020  ? Adenocarcinoma of right lung, stage 2 ([place]) 01/17/2020  ? Pulmonary nodules/lesions, multiple 01/07/2020  ? 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 12/21/2019  ? Thoracic aortic atherosclerosis ([place]) 12/21/2019  ? Erectile dysfunction 11/06/2019  ? Fatigue 10/16/2019  ? Insomnia 09/10/2019  ? Pain in left knee 10/13/2017  ? Physical exam, annual 03/30/2015  ? Essential hypertension 06/03/2014  ? Kidney stone 12/25/2012  ? GERD 01/27/2010  ? Hyperlipidemia 12/11/2009  ? GAD (generalized anxiety disorder) 11/17/2007  ? Anxiety state 11/17/2007  ? Allergic rhinitis 02/26/2007  ? 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 ABNORMAL CHEST XRAY 02/26/2007  ? ?PCP:  [name], DO ?Pharmacy:   ?[address] ?[place] [address] ?Phone: [phone] Fax: [phone] ? ?[place] [address] [place] [address] ?[place] [address] ?Phone: [phone] Fax: [phone] ? ? ? ? ?Social Determinants of Health (SDOH) Interventions ?  ? ?Readmission Risk Interventions ?   ? View : No data to display.  ?  ?  ?  ? ? ? ?

## 2021-08-17 NOTE — Telephone Encounter (Addendum)
?  FYI, patient is currently admitted ? ?----- Message from Lanier Clam, MD sent at 08/10/2021  4:52 PM EDT ----- ?Regarding: RE: Recurrent left pleural effusion ?IR is ok with me - I am not back inpatient for another couple of weeks. Thanks! ? ?----- Message ----- ?From: Martyn Ehrich, NP ?Sent: 07/30/2021  12:04 PM EDT ?To: Lanier Clam, MD ?Subject: Recurrent left pleural effusion               ? ?Patient of yours with adenocarcinoma right lung. You saw him last month for shortness of breath. Dyspnea is worse last 2-3 days. CXR today showed similar appearance of small left pleural effusion, likely with loculated component. ? ?I think you aren't back in the office for awhile, do you want me to send to IR for repeat thoracentesis of left pleural effusion? He would prefer this vs indwelling catheter.  ? ?-Beth  ? ? ?

## 2021-08-17 NOTE — Progress Notes (Addendum)
[facility] PROGRESS NOTE    Progress Note  Richard Li  ZOX:[id number] DOB: 01/11/1961 DOA: 09-06-21 PCP: Everrett Coombe, DO     Brief Narrative:   Richard Li is an 61 y.o. male past medical history significant for allergic rhinitis, chronic airway obstruction, nephrolithiasis stage IV lung cancer with brain mets and chemotherapy with recent recurrent pleural effusion recently discharged on 08/12/2021 due to dyspnea in the setting of current lung cancer pleural effusion and pneumonia.  Underwent left thoracocentesis with 1.7 L fluid has returned  with worsening dyspnea and low-grade temperature pleuritic chest pain palpitations.  Chest x-ray in the ED showed bilateral pleural effusion left is probably loculated with interstitial and hazy opacities on the right.    Assessment/Plan:   Acute respiratory failure with hypoxia (HCC) in the setting of adenocarcinoma/COPD with chronic bronchitis and left pleural effusion with superimposed healthcare associated pneumonia: Started on oxygen, scheduled bronchodilators, steroids, vancomycin and cefepime. Blood cultures and sputum cultures have been sent. CT angio of the chest was done that showed progressive airspace disease in the lower lobes bilaterally with a slight decrease in size of the large left pleural effusion.   Defervesced, his white blood cell count is 3.9 continue to monitor closely. Afebrile.  Generalized weakness Have consulted physical therapy.  Hyperlipidemia: Continue current home medication.  Essential hypertension: Currently on no antibiotics or medication.  Pancytopenia: Secondary to antineoplastic drugs.  Leukopenia is improving.  Anemia due to antineoplastic drug: Hemoglobin this morning 6.9 we will go and transfuse 2 units of packed red blood cells.  Constipation: Continue MiraLAX p.o. twice daily.  Hypophosphatemia: Replete orally this morning is 2.8.  Hypomagnesemia: Repleted still low  replete IV recheck tomorrow morning.  Hypokalemia: Repleted now resolved.   DVT prophylaxis: lovenox Family Communication:none Status is: Observation The patient will require care spanning > 2 midnights and should be moved to inpatient because: Acute respiratory failure secondary to pneumonia.    Code Status:     Code Status Orders  (From admission, onward)           Start     Ordered   Sep 06, 2021 1203  Do not attempt resuscitation (DNR)  Continuous       Question Answer Comment  In the event of cardiac or respiratory ARREST Do not call a "code blue"   In the event of cardiac or respiratory ARREST Do not perform Intubation, CPR, defibrillation or ACLS   In the event of cardiac or respiratory ARREST Use medication by any route, position, wound care, and other measures to relive pain and suffering. May use oxygen, suction and manual treatment of airway obstruction as needed for comfort.      Sep 06, 2021 1204           Code Status History     Date Active Date Inactive Code Status Order ID Comments User Context   08/10/2021 1719 08/12/2021 1718 DNR [id number]  Almon Hercules, MD ED   05/08/2020 1803 05/11/2020 2226 Partial Code [id number]  Emeline General, MD ED   05/08/2020 1750 05/08/2020 1803 DNR [id number]  Emeline General, MD ED   04/04/2020 1457 04/07/2020 1737 Partial Code [id number]  Pieter Partridge, MD ED   03/20/2020 1753 03/24/2020 2112 Full Code [id number]  Jae Dire, MD Inpatient   03/13/2020 1854 03/17/2020 1806 Partial Code [id number]  Teddy Spike, DO Inpatient   03/05/2020 1950 03/08/2020 1522 Full Code [id number]  Howerter, Chaney Born, DO ED  Advance Directive Documentation    Flowsheet Row Most Recent Value  Type of Advance Directive Healthcare Power of Attorney  Pre-existing out of facility DNR order (yellow form or pink MOST form) --  "MOST" Form in Place? --         IV Access:   Peripheral IV   Procedures and diagnostic studies:   CT  Angio Chest PE W and/or Wo Contrast  Result Date: 08/16/2021 CLINICAL DATA:  Progressive shortness of breath. Hypoxia. Tachypnea. Personal history of lung cancer and COPD. Nonspecific abdominal pain. EXAM: CT ANGIOGRAPHY CHEST CT ABDOMEN AND PELVIS WITH CONTRAST TECHNIQUE: Multidetector CT imaging of the chest was performed using the standard protocol during bolus administration of intravenous contrast. Multiplanar CT image reconstructions and MIPs were obtained to evaluate the vascular anatomy. Multidetector CT imaging of the abdomen and pelvis was performed using the standard protocol during bolus administration of intravenous contrast. RADIATION DOSE REDUCTION: This exam was performed according to the departmental dose-optimization program which includes automated exposure control, adjustment of the mA and/or kV according to patient size and/or use of iterative reconstruction technique. CONTRAST:  OMNIPAQUE IOHEXOL 350 MG/ML SOLN COMPARISON:  CT a chest 08/10/2021. CT chest abdomen and pelvis 06/21/2021. FINDINGS: CTA CHEST FINDINGS Cardiovascular: Heart size is normal. Right IJ Port-A-Cath again noted. Minimal pericardial fluid is present. Atherosclerotic calcifications are again seen at the aortic arch. Great vessel origins are within normal limits. No aneurysm or stenosis is present. Pulmonary artery opacification is excellent. No focal filling defects are present to suggest pulmonary embolus. Mediastinum/Nodes: No significant mediastinal, hilar, or axillary adenopathy is present. Lungs/Pleura: Post treatment changes in the right upper lobe again noted with prominent air bronchograms. A large left pleural effusion has decreased slightly. Fluid and pleural thickening noted along the major fissures. Progressive diffuse airspace disease is present in the lower lobes bilaterally and lingula. Minimal dependent atelectasis is present. Musculoskeletal: A T12 compression fracture is stable. No new fractures  are present. No chest wall lesions are present. Ribs are within normal limits. Review of the MIP images confirms the above findings. CT ABDOMEN and PELVIS FINDINGS Hepatobiliary: No focal liver abnormality is seen. No gallstones, gallbladder wall thickening, or biliary dilatation. Pancreas: Unremarkable. No pancreatic ductal dilatation or surrounding inflammatory changes. Spleen: Normal in size without focal abnormality. Adrenals/Urinary Tract: Adrenal glands are normal bilaterally. Kidneys are unremarkable. Ureters are within normal limits. Mild bladder wall thickening is similar the prior exam. Stomach/Bowel: Stomach and duodenum are within normal limits. Small bowel is unremarkable. Terminal ileum is within normal limits. Patient is status post appendectomy. The ascending and transverse colon are within normal limits. Descending and sigmoid colon are within normal limits. Vascular/Lymphatic: Atherosclerotic calcifications are present in the aorta and branch vessels. No aneurysm or focal stenosis is present. Reproductive: Prostate is enlarged measuring 4.7 cm in transverse diameter slight impact on the inferior bladder. No obstruction. Other: Mild stranding is present throughout the mesentery. No free fluid or free air. No significant ventral hernia. Musculoskeletal: T12 fracture is stable. Vertebral body heights and alignment otherwise normal. Bony pelvis is within normal limits. Hips are located and within normal limits. Review of the MIP images confirms the above findings. IMPRESSION: 1. No pulmonary embolus. 2. Post treatment changes in the right upper lobe. 3. Progressive airspace disease involving the lower lobes bilaterally and lingula, consistent with pneumonia. 4. Slight decrease in size of large left pleural effusion. 5. Stable T12 compression fracture. 6. Mild mesenteric edema is nonspecific. 7. No other  acute abnormality of the abdomen or pelvis. 8. Enlarged prostate gland with slight impact on the  inferior bladder. 9. Aortic Atherosclerosis (ICD10-I70.0). Electronically Signed   By: Marin Roberts M.D.   On: 08/16/2021 10:20   CT ABDOMEN PELVIS W CONTRAST  Result Date: 08/16/2021 CLINICAL DATA:  Progressive shortness of breath. Hypoxia. Tachypnea. Personal history of lung cancer and COPD. Nonspecific abdominal pain. EXAM: CT ANGIOGRAPHY CHEST CT ABDOMEN AND PELVIS WITH CONTRAST TECHNIQUE: Multidetector CT imaging of the chest was performed using the standard protocol during bolus administration of intravenous contrast. Multiplanar CT image reconstructions and MIPs were obtained to evaluate the vascular anatomy. Multidetector CT imaging of the abdomen and pelvis was performed using the standard protocol during bolus administration of intravenous contrast. RADIATION DOSE REDUCTION: This exam was performed according to the departmental dose-optimization program which includes automated exposure control, adjustment of the mA and/or kV according to patient size and/or use of iterative reconstruction technique. CONTRAST:  OMNIPAQUE IOHEXOL 350 MG/ML SOLN COMPARISON:  CT a chest 08/10/2021. CT chest abdomen and pelvis 06/21/2021. FINDINGS: CTA CHEST FINDINGS Cardiovascular: Heart size is normal. Right IJ Port-A-Cath again noted. Minimal pericardial fluid is present. Atherosclerotic calcifications are again seen at the aortic arch. Great vessel origins are within normal limits. No aneurysm or stenosis is present. Pulmonary artery opacification is excellent. No focal filling defects are present to suggest pulmonary embolus. Mediastinum/Nodes: No significant mediastinal, hilar, or axillary adenopathy is present. Lungs/Pleura: Post treatment changes in the right upper lobe again noted with prominent air bronchograms. A large left pleural effusion has decreased slightly. Fluid and pleural thickening noted along the major fissures. Progressive diffuse airspace disease is present in the lower lobes  bilaterally and lingula. Minimal dependent atelectasis is present. Musculoskeletal: A T12 compression fracture is stable. No new fractures are present. No chest wall lesions are present. Ribs are within normal limits. Review of the MIP images confirms the above findings. CT ABDOMEN and PELVIS FINDINGS Hepatobiliary: No focal liver abnormality is seen. No gallstones, gallbladder wall thickening, or biliary dilatation. Pancreas: Unremarkable. No pancreatic ductal dilatation or surrounding inflammatory changes. Spleen: Normal in size without focal abnormality. Adrenals/Urinary Tract: Adrenal glands are normal bilaterally. Kidneys are unremarkable. Ureters are within normal limits. Mild bladder wall thickening is similar the prior exam. Stomach/Bowel: Stomach and duodenum are within normal limits. Small bowel is unremarkable. Terminal ileum is within normal limits. Patient is status post appendectomy. The ascending and transverse colon are within normal limits. Descending and sigmoid colon are within normal limits. Vascular/Lymphatic: Atherosclerotic calcifications are present in the aorta and branch vessels. No aneurysm or focal stenosis is present. Reproductive: Prostate is enlarged measuring 4.7 cm in transverse diameter slight impact on the inferior bladder. No obstruction. Other: Mild stranding is present throughout the mesentery. No free fluid or free air. No significant ventral hernia. Musculoskeletal: T12 fracture is stable. Vertebral body heights and alignment otherwise normal. Bony pelvis is within normal limits. Hips are located and within normal limits. Review of the MIP images confirms the above findings. IMPRESSION: 1. No pulmonary embolus. 2. Post treatment changes in the right upper lobe. 3. Progressive airspace disease involving the lower lobes bilaterally and lingula, consistent with pneumonia. 4. Slight decrease in size of large left pleural effusion. 5. Stable T12 compression fracture. 6. Mild  mesenteric edema is nonspecific. 7. No other acute abnormality of the abdomen or pelvis. 8. Enlarged prostate gland with slight impact on the inferior bladder. 9. Aortic Atherosclerosis (ICD10-I70.0). Electronically Signed  By: Marin Roberts M.D.   On: 08/16/2021 10:20   DG Chest Port 1 View  Result Date: 08/16/2021 CLINICAL DATA:  COPD, lung cancer shortness of breath. EXAM: PORTABLE CHEST 1 VIEW COMPARISON:  08/10/2021. FINDINGS: 7:21 a.m., 08/16/2021. The cardiac size is stable. No vascular congestion is seen. Stable mediastinal widening superiorly. On CT appears to be due to aortic tortuosity. Small left pleural effusion is again noted with portions probably loculated in the lateral mid chest. There is a small right pleural effusion. Fibrotic consolidation and volume loss of the right upper lobe is redemonstrated with interstitial and patchy hazy disease process in the right lower lung field which could be pneumonia, edema, lymphangitic carcinomatosis or combination. On the left there is increased hazy interstitial consolidation in the lower lung field which could be atelectasis or pneumonia. Left upper lung field is clear. In all other respects no further changes. IMPRESSION: 1. Bilateral pleural effusions, left with probable partial loculation. 2. Interstitial and hazy opacities again noted in the right lower lung field, pneumonia versus edema or combination versus lymphangitic carcinomatosis. 3. Increased hazy interstitial consolidation left lower lung field. Electronically Signed   By: Almira Bar M.D.   On: 08/16/2021 07:34     Medical Consultants:   None.   Subjective:    GIANNO RONDAN relates his breathing is about the same as yesterday.  Objective:    Vitals:   08/17/21 0517 08/17/21 0600 08/17/21 0811 08/17/21 0813  BP:      Pulse:      Resp: 19 (!) 21    Temp:      TempSrc:      SpO2:   (!) 86% 99%  Weight:      Height:       SpO2: 99 % O2 Flow Rate (L/min):  2 L/min   Intake/Output Summary (Last 24 hours) at 08/17/2021 0832 Last data filed at 08/17/2021 0511 Gross per 24 hour  Intake 452.52 ml  Output 0 ml  Net 452.52 ml   Filed Weights   08/16/21 0637  Weight: 77.1 kg    Exam: General exam: In no acute distress. Respiratory system: Good air movement and crackles predominantly on the whole right lung Cardiovascular system: S1 & S2 heard, RRR. No JVD. Gastrointestinal system: Abdomen is nondistended, soft and nontender.  Extremities: No pedal edema. Skin: No rashes, lesions or ulcers Psychiatry: Judgement and insight appear normal. Mood & affect appropriate.    Data Reviewed:    Labs: Basic Metabolic Panel: Recent Labs  Lab 08/10/21 1124 08/11/21 0525 08/12/21 0434 08/16/21 0749 08/16/21 0907 08/17/21 0406  NA 136 133* 136 139  --  138  K 4.2 3.5 3.5 3.4*  --  4.4  CL 102 104 107 106  --  105  CO2 25 22 23 26   --  29  GLUCOSE 107* 103* 142* 111*  --  116*  BUN 13 12 11 13   --  13  CREATININE 1.00 0.92 0.80 0.72  --  0.74  CALCIUM 8.6* 7.9* 8.2* 8.9  --  8.7*  MG  --  1.5* 2.1  --  1.5* 1.5*  PHOS  --  2.6  --   --  1.8* 2.8   GFR Estimated Creatinine Clearance: 98.2 mL/min (by C-G formula based on SCr of 0.74 mg/dL). Liver Function Tests: Recent Labs  Lab 08/10/21 1124 08/11/21 0525 08/16/21 0749 08/17/21 0406  AST 30 25 47* 37  ALT 12 12 32 28  ALKPHOS 108  91 152* 137*  BILITOT 1.0 1.3* 0.6 0.8  PROT 6.5 5.3* 5.9* 5.5*  ALBUMIN 2.5* 2.2* 2.2* 2.1*   No results for input(s): LIPASE, AMYLASE in the last 168 hours. Recent Labs  Lab 08/16/21 0749  AMMONIA 24   Coagulation profile Recent Labs  Lab 08/16/21 0749  INR 1.1   COVID-19 Labs  No results for input(s): DDIMER, FERRITIN, LDH, CRP in the last 72 hours.  Lab Results  Component Value Date   SARSCOV2NAA NEGATIVE 08/16/2021   SARSCOV2NAA NEGATIVE 05/08/2020   SARSCOV2NAA NEGATIVE 04/04/2020   SARSCOV2NAA NEGATIVE 03/20/2020     CBC: Recent Labs  Lab 08/10/21 1124 08/11/21 0525 08/12/21 0434 08/16/21 1202 08/17/21 0406  WBC 10.4 6.3 3.2* 3.5* 3.9*  NEUTROABS 9.6*  --  2.9  --   --   HGB 9.9* 7.4* 7.2* 7.4* 6.9*  HCT 29.5* 22.2* 21.4* 21.6* 20.7*  MCV 102.8* 104.2* 100.5* 101.9* 102.5*  PLT 272 132* 125* 93* 93*   Cardiac Enzymes: No results for input(s): CKTOTAL, CKMB, CKMBINDEX, TROPONINI in the last 168 hours. BNP (last 3 results) No results for input(s): PROBNP in the last 8760 hours. CBG: No results for input(s): GLUCAP in the last 168 hours. D-Dimer: No results for input(s): DDIMER in the last 72 hours. Hgb A1c: No results for input(s): HGBA1C in the last 72 hours. Lipid Profile: No results for input(s): CHOL, HDL, LDLCALC, TRIG, CHOLHDL, LDLDIRECT in the last 72 hours. Thyroid function studies: No results for input(s): TSH, T4TOTAL, T3FREE, THYROIDAB in the last 72 hours.  Invalid input(s): FREET3 Anemia work up: No results for input(s): VITAMINB12, FOLATE, FERRITIN, TIBC, IRON, RETICCTPCT in the last 72 hours. Sepsis Labs: Recent Labs  Lab 08/10/21 1124 08/11/21 0525 08/11/21 0817 08/12/21 0434 08/16/21 0749 08/16/21 1202 08/17/21 0406  PROCALCITON  --   --   --   --  0.34  --   --   WBC 10.4 6.3  --  3.2*  --  3.5* 3.9*  LATICACIDVEN 2.5* 0.7 1.1  --   --   --   --    Microbiology Recent Results (from the past 240 hour(s))  Culture, body fluid w Gram Stain-bottle     Status: None   Collection Time: 08/10/21  9:34 AM   Specimen: Pleura  Result Value Ref Range Status   Specimen Description PLEURAL  Final   Special Requests BOTTLES DRAWN AEROBIC AND ANAEROBIC  Final   Culture   Final    NO GROWTH 5 DAYS Performed at [facility], [address], [place], [place] [address]    Report Status 08/15/2021 FINAL  Final  Gram stain     Status: None   Collection Time: 08/10/21  9:34 AM   Specimen: Pleura  Result Value Ref Range Status   Specimen Description PLEURAL   Final   Special Requests NONE  Final   Gram Stain   Final    FEW WBC PRESENT, PREDOMINANTLY PMN NO ORGANISMS SEEN Performed at [facility], [address], [place], [place] [address]    Report Status 08/10/2021 FINAL  Final  Blood culture (routine x 2)     Status: None   Collection Time: 08/10/21  3:10 PM   Specimen: BLOOD  Result Value Ref Range Status   Specimen Description   Final    BLOOD Performed at [facility], [address] [place], [place] [address]    Special Requests   Final  BOTTLES DRAWN AEROBIC AND ANAEROBIC Blood Culture adequate volume Performed at [facility], [address] [place], [place] [address]    Culture   Final    NO GROWTH 5 DAYS Performed at [facility], [address] [place], [place] [address]    Report Status 08/15/2021 FINAL  Final  Blood culture (routine x 2)     Status: None   Collection Time: 08/10/21  3:10 PM   Specimen: BLOOD  Result Value Ref Range Status   Specimen Description   Final    BLOOD Performed at [facility], [address] [place], [place] [address]    Special Requests   Final    BOTTLES DRAWN AEROBIC AND ANAEROBIC Blood Culture adequate volume Performed at [facility], [address], [place], [place] [address]    Culture   Final    NO GROWTH 5 DAYS Performed at [facility], [address] [place], [place] [address]    Report Status 08/15/2021 FINAL  Final  Acid Fast Smear (AFB)     Status: None   Collection Time: 08/10/21  4:04 PM   Specimen: PATH Cytology Pleural fluid  Result Value Ref Range Status   AFB Specimen Processing Concentration  Final   Acid Fast Smear Negative  Final    Comment: (NOTE) Performed At: [facility] [address] [place], [place] [id number] Jolene Schimke MD ON:[id number]    Source (AFB) PLEURAL  Final    Comment: Performed at [facility], [address], [place], [place] [address]  Resp Panel by RT-PCR (Flu A&B, Covid) Nasopharyngeal Swab     Status: None   Collection Time: 08/16/21  7:49 AM   Specimen: Nasopharyngeal Swab; Nasopharyngeal(NP) swabs in vial transport medium  Result Value Ref Range Status   SARS Coronavirus 2 by RT PCR NEGATIVE NEGATIVE Final    Comment: (NOTE) SARS-CoV-2 target nucleic acids are NOT DETECTED.  The SARS-CoV-2 RNA is generally detectable in upper respiratory specimens during the acute phase of infection. The lowest concentration of SARS-CoV-2 viral copies this assay can detect is 138 copies/mL. A negative result does not preclude SARS-Cov-2 infection and should not be used as the sole basis for treatment or other patient management decisions. A negative result may occur with  improper specimen collection/handling, submission of specimen other than nasopharyngeal swab, presence of viral mutation(s) within the areas targeted by this assay, and inadequate number of viral copies(<138 copies/mL). A negative result must be combined with clinical observations, patient history, and epidemiological information. The expected result is Negative.  Fact Sheet for Patients:  BloggerCourse.com  Fact Sheet for Healthcare Providers:  SeriousBroker.it  This test is no t yet approved or cleared by the [place] FDA and  has been authorized for detection and/or diagnosis of SARS-CoV-2 by FDA under an Emergency Use Authorization (EUA). This EUA will remain  in effect (meaning this test can be used) for the duration of the COVID-19 declaration under Section 564(b)(1) of the Act, 21 U.S.C.section 360bbb-3(b)(1), unless the authorization is terminated  or revoked sooner.       Influenza A by PCR NEGATIVE NEGATIVE Final   Influenza B by PCR NEGATIVE NEGATIVE Final    Comment: (NOTE) The Xpert Xpress SARS-CoV-2/FLU/RSV plus assay is intended as an aid in the  diagnosis of influenza from Nasopharyngeal swab specimens and should not be used as a sole basis for treatment. Nasal washings and aspirates are unacceptable  for Xpert Xpress SARS-CoV-2/FLU/RSV testing.  Fact Sheet for Patients: BloggerCourse.com  Fact Sheet for Healthcare Providers: SeriousBroker.it  This test is not yet approved or cleared by the [place] FDA and has been authorized for detection and/or diagnosis of SARS-CoV-2 by FDA under an Emergency Use Authorization (EUA). This EUA will remain in effect (meaning this test can be used) for the duration of the COVID-19 declaration under Section 564(b)(1) of the Act, 21 U.S.C. section 360bbb-3(b)(1), unless the authorization is terminated or revoked.  Performed at [facility], [address], [place], [place] [address]   MRSA Next Gen by PCR, Nasal     Status: None   Collection Time: 07-Sep-2021 12:38 PM   Specimen: Nasal Mucosa; Nasal Swab  Result Value Ref Range Status   MRSA by PCR Next Gen NOT DETECTED NOT DETECTED Final    Comment:        The GeneXpert MRSA Assay (FDA approved for NASAL specimens only), is one component of a comprehensive MRSA colonization surveillance program. It is not intended to diagnose MRSA infection nor to guide or monitor treatment for MRSA infections. Performed at [facility], [address] [place], [place] [address]      Medications:    sodium chloride   Intravenous Once   Chlorhexidine Gluconate Cloth  6 each Topical Daily   dextromethorphan-guaiFENesin  1 tablet Oral BID   feeding supplement  237 mL Oral BID BM   fenofibrate  160 mg Oral Daily   folic acid  1 mg Oral Daily   ipratropium-albuterol  3 mL Nebulization TID   mouth rinse  15 mL Mouth Rinse BID   mometasone-formoterol  2 puff Inhalation BID   phosphorus  500 mg Oral TID   potassium chloride SA  20 mEq Oral Daily    predniSONE  10 mg Oral Q breakfast   senna-docusate  1 tablet Oral BID   Continuous Infusions:  sodium chloride Stopped (Sep 07, 2021 1828)   ceFEPime (MAXIPIME) IV 2 g (08/17/21 0511)   vancomycin 1,000 mg (08/17/21 0550)      LOS: 0 days   Marinda Elk  [facility]  08/17/2021, 8:32 AM

## 2021-08-18 DIAGNOSIS — D61818 Other pancytopenia: Secondary | ICD-10-CM | POA: Diagnosis not present

## 2021-08-18 DIAGNOSIS — C3491 Malignant neoplasm of unspecified part of right bronchus or lung: Secondary | ICD-10-CM

## 2021-08-18 DIAGNOSIS — J189 Pneumonia, unspecified organism: Secondary | ICD-10-CM | POA: Diagnosis not present

## 2021-08-18 DIAGNOSIS — J9601 Acute respiratory failure with hypoxia: Secondary | ICD-10-CM | POA: Diagnosis not present

## 2021-08-18 LAB — BASIC METABOLIC PANEL
Anion gap: 8 (ref 5–15)
BUN: 8 mg/dL (ref 6–20)
CO2: 28 mmol/L (ref 22–32)
Calcium: 8.8 mg/dL — ABNORMAL LOW (ref 8.9–10.3)
Chloride: 102 mmol/L (ref 98–111)
Creatinine, Ser: 0.72 mg/dL (ref 0.61–1.24)
GFR, Estimated: >60 mL/min (ref >60)
Glucose, Bld: 82 mg/dL (ref 70–99)
Potassium: 3.4 mmol/L — ABNORMAL LOW (ref 3.5–5.1)
Sodium: 138 mmol/L (ref 135–145)

## 2021-08-18 LAB — BPAM RBC
Blood Product Expiration Date: 202305242359
Blood Product Expiration Date: 202305242359
ISSUE DATE / TIME: 202304251228
ISSUE DATE / TIME: 202304251453
Unit Type and Rh: 5100
Unit Type and Rh: 5100

## 2021-08-18 LAB — TYPE AND SCREEN
ABO/RH(D): O POS
Antibody Screen: NEGATIVE
Unit division: 0
Unit division: 0

## 2021-08-18 LAB — MAGNESIUM: Magnesium: 1.8 mg/dL (ref 1.7–2.4)

## 2021-08-18 MED ORDER — OXYCODONE HCL 5 MG PO TABS
5.0000 mg | ORAL_TABLET | ORAL | Status: DC | PRN
  Administered 2021-08-18: 10 mg via ORAL
  Administered 2021-08-18: 5 mg via ORAL
  Administered 2021-08-19 – 2021-08-21 (×5): 10 mg via ORAL
  Filled 2021-08-18 (×7): qty 2

## 2021-08-18 MED ORDER — HYDROMORPHONE HCL 1 MG/ML IJ SOLN
0.5000 mg | INTRAMUSCULAR | Status: DC | PRN
  Administered 2021-08-18 – 2021-08-20 (×5): 0.5 mg via INTRAVENOUS
  Filled 2021-08-18: qty 1
  Filled 2021-08-18 (×4): qty 0.5

## 2021-08-18 NOTE — Progress Notes (Addendum)
?  Progress Note ? ? ?Patient: Richard Li LEX:[id number] DOB: 07-26-1960 DOA: 07/30/2021     1 ?DOS: the patient was seen and examined on 08/18/2021 ?  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  Admitted for acute hypoxic respiratory failure secondary to pneumonia. ? ?Assessment and Plan: ?Acute respiratory failure with hypoxia (HCC) secondary to pneumonia, complicated by adenocarcinoma/COPD with chronic bronchitis and left pleural effusion  ?--Slow to improve.  Continue supplemental oxygen, bronchodilators, steroids, empiric antibiotics.   ? ?Pancytopenia: ?--Secondary to antineoplastic drugs. No labs today, will check CBC in AM ? ?Anemia due to antineoplastic drug: ?--Hemoglobin up after transfusion, check CBC in AM ? ?  ? ?Subjective:  ?Feels ok, a little better but still quite short of breath "scary to go to bathroom because I get so short of breath". ? ?Physical Exam: ?Vitals:  ? 08/18/21 0745 08/18/21 1054 08/18/21 1308 08/18/21 1448  ?BP:  (!) 142/85  121/75  ?Pulse:  (!) 115  (!) 105  ?Resp:  19  19  ?Temp:  98.1 ?F (36.7 ?C)  97.9 ?F (36.6 ?C)  ?TempSrc:  Oral  Oral  ?SpO2: 90% (!) 87% 91% 98%  ?Weight:      ?Height:      ? ?Physical Exam ?Vitals reviewed.  ?Constitutional:   ?   General: He is not in acute distress. ?   Appearance: He is not ill-appearing or toxic-appearing.  ?Cardiovascular:  ?   Rate and Rhythm: Normal rate and regular rhythm.  ?   Heart sounds: No murmur heard. ?Pulmonary:  ?   Breath sounds: Wheezing present.  ?   Comments: Increased respiratory effort ?Neurological:  ?   Mental Status: He is alert.  ?Psychiatric:     ?   Mood and Affect: Mood normal.     ?   Behavior: Behavior normal.  ? ? ?Data Reviewed: ? ?K+ 3.4 ? ?Family Communication: wife at bedside ? ?Disposition: ?Status is: Inpatient ?Remains inpatient  appropriate because: pneumonia ? Planned Discharge Destination: Home ? ? ? ?Time spent: 25 minutes ? ?Author: ?Murray Hodgkins, MD ?08/18/2021 5:27 PM ? ?For on call review [email or web address].  ?

## 2021-08-18 NOTE — Progress Notes (Signed)
Patient states that he does not wear Bipap at HS ?

## 2021-08-18 NOTE — Hospital Course (Addendum)
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  Admitted for acute hypoxic respiratory failure secondary to pneumonia.  Treated with antibiotics without significant improvement raising concern for other causes including effusion-mediated, immune-mediated pneumonitis and lymphangitic spread.  Seen by pulmonology, PleurX catheter placed 4/28, became tachycardic afterwards and transferred to stepdown. Seen by oncology, concern for immune mediated pneumonitis, therefore Keytruda stopped and started on prednisone.  Respiratory distress is marked, started on BiPAP, prognosis guarded, may not survive hospitalization. ?

## 2021-08-19 DIAGNOSIS — J189 Pneumonia, unspecified organism: Secondary | ICD-10-CM | POA: Diagnosis not present

## 2021-08-19 DIAGNOSIS — C3491 Malignant neoplasm of unspecified part of right bronchus or lung: Secondary | ICD-10-CM | POA: Diagnosis not present

## 2021-08-19 DIAGNOSIS — J9601 Acute respiratory failure with hypoxia: Secondary | ICD-10-CM | POA: Diagnosis not present

## 2021-08-19 DIAGNOSIS — D61818 Other pancytopenia: Secondary | ICD-10-CM | POA: Diagnosis not present

## 2021-08-19 LAB — CBC
HCT: 28.7 % — ABNORMAL LOW (ref 39.0–52.0)
Hemoglobin: 9.6 g/dL — ABNORMAL LOW (ref 13.0–17.0)
MCH: 32.9 pg (ref 26.0–34.0)
MCHC: 33.4 g/dL (ref 30.0–36.0)
MCV: 98.3 fL (ref 80.0–100.0)
Platelets: 126 10*3/uL — ABNORMAL LOW (ref 150–400)
RBC: 2.92 MIL/uL — ABNORMAL LOW (ref 4.22–5.81)
RDW: 16.9 % — ABNORMAL HIGH (ref 11.5–15.5)
WBC: 6.5 10*3/uL (ref 4.0–10.5)
nRBC: 0.3 % — ABNORMAL HIGH (ref 0.0–0.2)

## 2021-08-19 NOTE — Progress Notes (Signed)
?  Progress Note ? ? ?Patient: Richard Li LNL:[id number] DOB: 01/10/1961 DOA: 08/15/2021     2 ?DOS: the patient was seen and examined on 08/19/2021 ?  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  Admitted for acute hypoxic respiratory failure secondary to pneumonia. ? ?Assessment and Plan: ?* Acute respiratory failure with hypoxia ([place]) ?secondary to pneumonia, complicated by metastatic lung adenocarcinoma/COPD with chronic bronchitis and left pleural effusion  ?--no significant medical improvement.  Continue antibiotics.  Pleural effusion may be contributing,  was pulmonology to see for consideration of thoracentesis versus pigtail catheter versus Pleurx catheter.  Some concern for lymphangitic spread, oncology consultation placed to assist with care. ? ?Multifocal pneumonia ?--abx as above ? ?Adenocarcinoma of right lung, stage 2 [facility]) ?--oncology consulted ? ?Pancytopenia ([place]) ?--secondary to chemotherapy ?--WBC has recovered; Hgb stable s/p PRBC; Plts stable ? ?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[place]) ?--appears stable; continue bronchodilators ? ?Hypokalemia ?--replete ? ? ?  ? ?Subjective:  ?No events charted overnight ?Feels poorly, no better ?Still quite SOB ?Having to go to the bathroom provokes anxiety ? ? ?Physical Exam: ?Vitals:  ? 08/18/21 2023 08/19/21 0545 08/19/21 0730 08/19/21 1616  ?BP: 133/66 127/71  124/67  ?Pulse: (!) 110 (!) 101  (!) 113  ?Resp: 20 20  17   ?Temp: 97.8 ?F (36.6 ?C) 98.1 ?F (36.7 ?C)  98.2 ?F (36.8 ?C)  ?TempSrc: Oral Oral  Oral  ?SpO2: 96% 92% 94% 91%  ?Weight:      ?Height:      ?Afebrile, VSS, on 3L [place] ?Level 6 mobility  ?Physical Exam ?Vitals and nursing note reviewed.  ?Constitutional:   ?   General: He is not in acute distress. ?   Appearance: He is ill-appearing. He is not  toxic-appearing.  ?Cardiovascular:  ?   Rate and Rhythm: Normal rate and regular rhythm.  ?   Heart sounds: No murmur heard. ?Pulmonary:  ?   Effort: Respiratory distress present.  ?   Breath sounds: No wheezing, rhonchi or rales.  ?   Comments: Mild tachypnea and dyspnea does limit ability to speak at length ?Neurological:  ?   Mental Status: He is alert.  ?Psychiatric:     ?   Mood and Affect: Mood normal.     ?   Behavior: Behavior normal.  ? ? ?Data Reviewed: ? ?Plts up to 126 ?WBC up to WNL ?Hgb stable 9.6 ? ?Family Communication: wife at bedside ? ?Disposition: ?Status is: Inpatient ?Remains inpatient appropriate because: short of breath, pneumonia, pleural effusion, pulmonology and oncology evaluations ? Planned Discharge Destination: Home ? ? ? ?Time spent: 35 minutes ? ?Author: ?Murray Hodgkins, MD ?08/19/2021 4:40 PM ? ?For on call review [email or web address].  ?

## 2021-08-19 NOTE — Consult Note (Signed)
? ?NAME:  Richard Li, MRN:  [id number], DOB:  [birth year], LOS: 2 ?ADMISSION DATE:  08/09/2021, CONSULTATION DATE:  08/19/2021 ?REFERRING MD:  Dr. Sarajane Jews, CHIEF COMPLAINT:  Recurrent left pleural effusion   ? ?History of Present Illness:  ?Richard Li is a 61 y.o. 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 Also reported progressive cough with yellow sputum produced. He was admitted per [facility] for management of CAP complicated by metastatic lung cancer with recurrent pleural effusion.  ? ?PCCM was consulted for assistance in management of malignant pleural effusion. Of note patient has had 3 thoracentesis within the last few months with most recent tap being 08/10/21 with 1.7L of hazy amber fluid removed. Patient preferred to continue with thoracentesis instead of placement of PleurX on last visit with pulmonary  ? 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 ?Significant Hospital Events: ?Including procedures, antibiotic start and stop dates in addition to other pertinent events   ?4/24 presented with progressive dyspnea, admitted for treatment of CAP ?4/27 PCCM consulted for assistance in management of recurrent malignant pleural effusion ? ?Interim History / Subjective:  ?Seen sitting up in bedside recline with complaints of overall fatigue and weakness. Wife is at bedside and updated as well  ? ?Objective   ?Blood pressure 127/71, pulse (!) 101, temperature 98.1 ?F (36.7 ?C), temperature source Oral, resp. rate 20, height 5\' 9"  (1.753 m), weight 77.1 kg, SpO2 94 %. ?   ?   ? ?Intake/Output Summary (Last 24 hours) at 08/19/2021 1306 ?Last data filed at  08/19/2021 3785 ?Gross per 24 hour  ?Intake 450 ml  ?Output 700 ml  ?Net -250 ml  ? ?Filed Weights  ? 08/03/2021 8850  ?Weight: 77.1 kg  ? ? ?Examination: ?General: Acute on chronically ill appearing middle aged male sitting up in bedside recline, in NAD ?HEENT: [place]/AT, MM pink/moist, PERRL,  ?Neuro: Alert and oriented x3, non-focal  ?CV: s1s2 regular rate and rhythm, no murmur, rubs, or gallops,  ?PULM:  Clear to ascultation, no increased work of breathing, diminished bases, on supplemental oxygen ?GI: soft, bowel sounds active in all 4 quadrants, non-tender, non-distended, tolerating oral diet ?Extremities: warm/dry, no edema  ?Skin: no rashes or lesions ? ?Resolved Hospital Problem list   ? ? 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 Julien Nordmann  ?Concern for malignant pleural effusion  ?-Has had multiple thoracentesis with no malignancy on cytology but high suspicion given lymphocyte predominance  ?P: ?Patient would like to proceed with one more thoracentesis with plans to schedule PleurX in the near future ?Will plan for thoracentesis tomorrow morning  ?Obtain pleural studies including cytology post thoracentesis  ?Continue supplemental oxygen  ?Empiric Cefepime  ?Encourage pulmonary hygiene  ? ?Best Practice (right click and "Reselect all SmartList Selections" daily)  ? ?Per primary  ? ?Labs   ?CBC: ?Recent Labs  ?Lab 08/15/2021 ?1202 08/17/21 ?0406 08/17/21 ?2124 08/19/21 ?2774  ?WBC 3.5* 3.9*  --  6.5  ?HGB 7.4* 6.9* 9.3* 9.6*  ?HCT 21.6* 20.7* 27.1* 28.7*  ?MCV 101.9* 102.5*  --  98.3  ?PLT 93* 93*  --  126*  ? ? ?  Basic Metabolic Panel: ?Recent Labs  ?Lab 08/05/2021 ?1950 08/08/2021 ?0907 08/17/21 ?0406 08/18/21 ?9326  ?NA 139  --  138 138  ?K 3.4*  --  4.4 3.4*  ?CL 106  --  105 102  ?CO2 26  --  29 28  ?GLUCOSE 111*  --  116* 82  ?BUN 13  --  13 8  ?CREATININE 0.72  --  0.74 0.72  ?CALCIUM 8.9  --  8.7* 8.8*  ?MG  --  1.5* 1.5* 1.8  ?PHOS  --  1.8* 2.8  --    ? ?GFR: ?Estimated Creatinine Clearance: 98.2 mL/min (by C-G formula based on SCr of 0.72 mg/dL). ?Recent Labs  ?Lab 08/05/2021 ?0749 07/25/2021 ?1202 08/17/21 ?0406 08/19/21 ?0506  ?PROCALCITON 0.34  --   --   --   ?WBC  --  3.5* 3.9* 6.5  ? ? ?Liver Function Tests: ?Recent Labs  ?Lab 07/25/2021 ?0749 08/17/21 ?0406  ?AST 47* 37  ?ALT 32 28  ?ALKPHOS 152* 137*  ?BILITOT 0.6 0.8  ?PROT 5.9* 5.5*  ?ALBUMIN 2.2* 2.1*  ? ?No results for input(s): LIPASE, AMYLASE in the last 168 hours. ?Recent Labs  ?Lab 08/01/2021 ?7124  ?AMMONIA 24  ? ? ?ABG ?   ?Component Value Date/Time  ? HCO3 27.8 07/26/2021 0749  ? O2SAT 63 07/26/2021 0749  ?  ? ?Coagulation Profile: ?Recent Labs  ?Lab 07/26/2021 ?5809  ?INR 1.1  ? ? ?Cardiac Enzymes: ?No results for input(s): CKTOTAL, CKMB, CKMBINDEX, TROPONINI in the last 168 hours. ? ?HbA1C: ?No results found for: HGBA1C ? ?CBG: ?No results for input(s): GLUCAP in the last 168 hours. ? ?Review of Systems:   ?Please see the history of present illness. All other systems reviewed and are negative  ? ?Past Medical History:  ?He,  has a past medical history of Allergic rhinitis, cause unspecified, Anxiety state, unspecified, Asthma, Chronic airway obstruction, not elsewhere classified, Esophageal reflux, History of kidney stones, History of radiation therapy (02/13/2020-03/24/2020), Hypertension, Lumbago, lung ca (dx'd 12/2019), Other and unspecified hyperlipidemia, and Other chest pain.  ? ?Surgical History:  ? ?Past Surgical History:  ?Procedure Laterality Date  ? APPENDECTOMY    ? BRONCHIAL BIOPSY  01/07/2020  ? Procedure: BRONCHIAL BIOPSIES;  Surgeon: Collene Gobble, MD;  Location: [facility] ENDOSCOPY;  Service: Pulmonary;;  ? BRONCHIAL BIOPSY  04/05/2020  ? Procedure: BRONCHIAL BIOPSIES;  Surgeon: Rigoberto Noel, MD;  Location: [place];  Service: Cardiopulmonary;;  ? BRONCHIAL BRUSHINGS  01/07/2020  ? Procedure: BRONCHIAL BRUSHINGS;  Surgeon: Collene Gobble, MD;  Location: [facility] ENDOSCOPY;  Service:  Pulmonary;;  ? BRONCHIAL NEEDLE ASPIRATION BIOPSY  01/07/2020  ? Procedure: BRONCHIAL NEEDLE ASPIRATION BIOPSIES;  Surgeon: Collene Gobble, MD;  Location: [facility] ENDOSCOPY;  Service: Pulmonary;;  ? BRONCHIAL WASHINGS  01/07/2020  ? Procedure: BRONCHIAL WASHINGS;  Surgeon: Collene Gobble, MD;  Location: [facility] ENDOSCOPY;  Service: Pulmonary;;  ? BRONCHIAL WASHINGS  04/05/2020  ? Procedure: BRONCHIAL WASHINGS;  Surgeon: Rigoberto Noel, MD;  Location: [facility] ENDOSCOPY;  Service: Cardiopulmonary;;  ? COLONOSCOPY  15 years ago  ? HEMOSTASIS CONTROL  01/07/2020  ? Procedure: HEMOSTASIS CONTROL;  Surgeon: Collene Gobble, MD;  Location[facility] END[facility]COPY;  Service: Pulmonary;;  cold saline  ? IR IMAGING GUIDED PORT INSERTION  09/18/2020  ? NASAL TURBINATE REDUCTION  2002  ? Dr.Crossley  ? THORACENTESIS N/A 05/28/2021  ? Procedure: THORACENTESIS;  Surgeon: Lanier Clam, MD;  Location: [facility] ENDOSCOPY;  Service: Pulmonary;  Laterality: N/A;  ?  VASECTOMY    ? VIDEO BRONCHOSCOPY Right 04/05/2020  ? Procedure: VIDEO BRONCHOSCOPY WITH FLUORO;  Surgeon: Rigoberto Noel, MD;  Location: [place];  Service: Cardiopulmonary;  Laterality: Right;  ? VIDEO BRONCHOSCOPY WITH ENDOBRONCHIAL NAVIGATION N/A 01/07/2020  ? Procedure: VIDEO BRONCHOSCOPY WITH ENDOBRONCHIAL NAVIGATION;  Surgeon: Collene Gobble, MD;  Location: [facility] ENDOSCOPY;  Service: Pulmonary;  Laterality: N/A;  ? VIDEO BRONCHOSCOPY WITH ENDOBRONCHIAL ULTRASOUND N/A 01/07/2020  ? Procedure: VIDEO BRONCHOSCOPY WITH ENDOBRONCHIAL ULTRASOUND;  Surgeon: Collene Gobble, MD;  Location: [facility] ENDOSCOPY;  Service: Pulmonary;  Laterality: N/A;  ?  ? ?Social History:  ? reports that he quit smoking about 16 years ago. His smoking use included cigarettes. He has a 56.00 pack-year smoking history. He has never used smokeless tobacco. He reports that he does not currently use alcohol after a past usage of about 12.0 standard drinks per week. He reports that he does not use drugs.  ? ?Family History:  ?His  family history includes Breast cancer in his mother; COPD in his maternal uncle; Cancer in his father; Emphysema in his maternal uncle; Heart attack in his brother; Heart disease in his maternal uncle. There is no history of

## 2021-08-19 NOTE — Assessment & Plan Note (Signed)
--  appears stable; continue bronchodilators ?

## 2021-08-19 NOTE — Assessment & Plan Note (Addendum)
--  secondary to chemotherapy ?--WBC recovered; Hgb stable s/p PRBC; Plts stable ?

## 2021-08-19 NOTE — Assessment & Plan Note (Addendum)
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 ?--s/p Pleurx catheter 4/28,started on high dose prednisone for 2 weeks, plan outpt f/u with Dr. Willette Alma and repeat scan in 5-6 weeks. ?--per Dr. Julien Nordmann "If he has no improvement on the treatment with prednisone, then this is likely to be progressive disease and we would consider him for palliative care and hospice referral at this point because of the significant disease progression and unlikely to benefit from additional systemic chemotherapy with his condition." ?--worse today, now on BiPAP, increased work of breathing, tachycardic, prognosis guarded. ?

## 2021-08-19 NOTE — Assessment & Plan Note (Addendum)
--  oncology added steroids, concerned for ADR from [facility].  ?

## 2021-08-19 NOTE — Assessment & Plan Note (Deleted)
-  replete °

## 2021-08-19 NOTE — Assessment & Plan Note (Addendum)
--  now doubted, will complete abx   ?

## 2021-08-20 ENCOUNTER — Inpatient Hospital Stay (HOSPITAL_COMMUNITY): Payer: BC Managed Care – PPO

## 2021-08-20 DIAGNOSIS — J90 Pleural effusion, not elsewhere classified: Secondary | ICD-10-CM

## 2021-08-20 DIAGNOSIS — C3491 Malignant neoplasm of unspecified part of right bronchus or lung: Secondary | ICD-10-CM | POA: Diagnosis not present

## 2021-08-20 DIAGNOSIS — J9601 Acute respiratory failure with hypoxia: Secondary | ICD-10-CM | POA: Diagnosis not present

## 2021-08-20 DIAGNOSIS — J189 Pneumonia, unspecified organism: Secondary | ICD-10-CM | POA: Diagnosis not present

## 2021-08-20 LAB — BLOOD GAS, ARTERIAL
Acid-Base Excess: 7.3 mmol/L — ABNORMAL HIGH (ref 0.0–2.0)
Bicarbonate: 32.6 mmol/L — ABNORMAL HIGH (ref 20.0–28.0)
O2 Saturation: 99.4 %
Patient temperature: 36.6
pCO2 arterial: 47 mmHg (ref 32–48)
pH, Arterial: 7.45 (ref 7.35–7.45)
pO2, Arterial: 130 mmHg — ABNORMAL HIGH (ref 83–108)

## 2021-08-20 LAB — MRSA NEXT GEN BY PCR, NASAL: MRSA by PCR Next Gen: NOT DETECTED

## 2021-08-20 IMAGING — DX DG CHEST 1V PORT
2 series · 2 of 2 positions shown · non-contrast
Comparison: [DATE]

CLINICAL DATA: Left chest tube placement

EXAM:
PORTABLE CHEST 1 VIEW

[chest ap (1 of 2)]
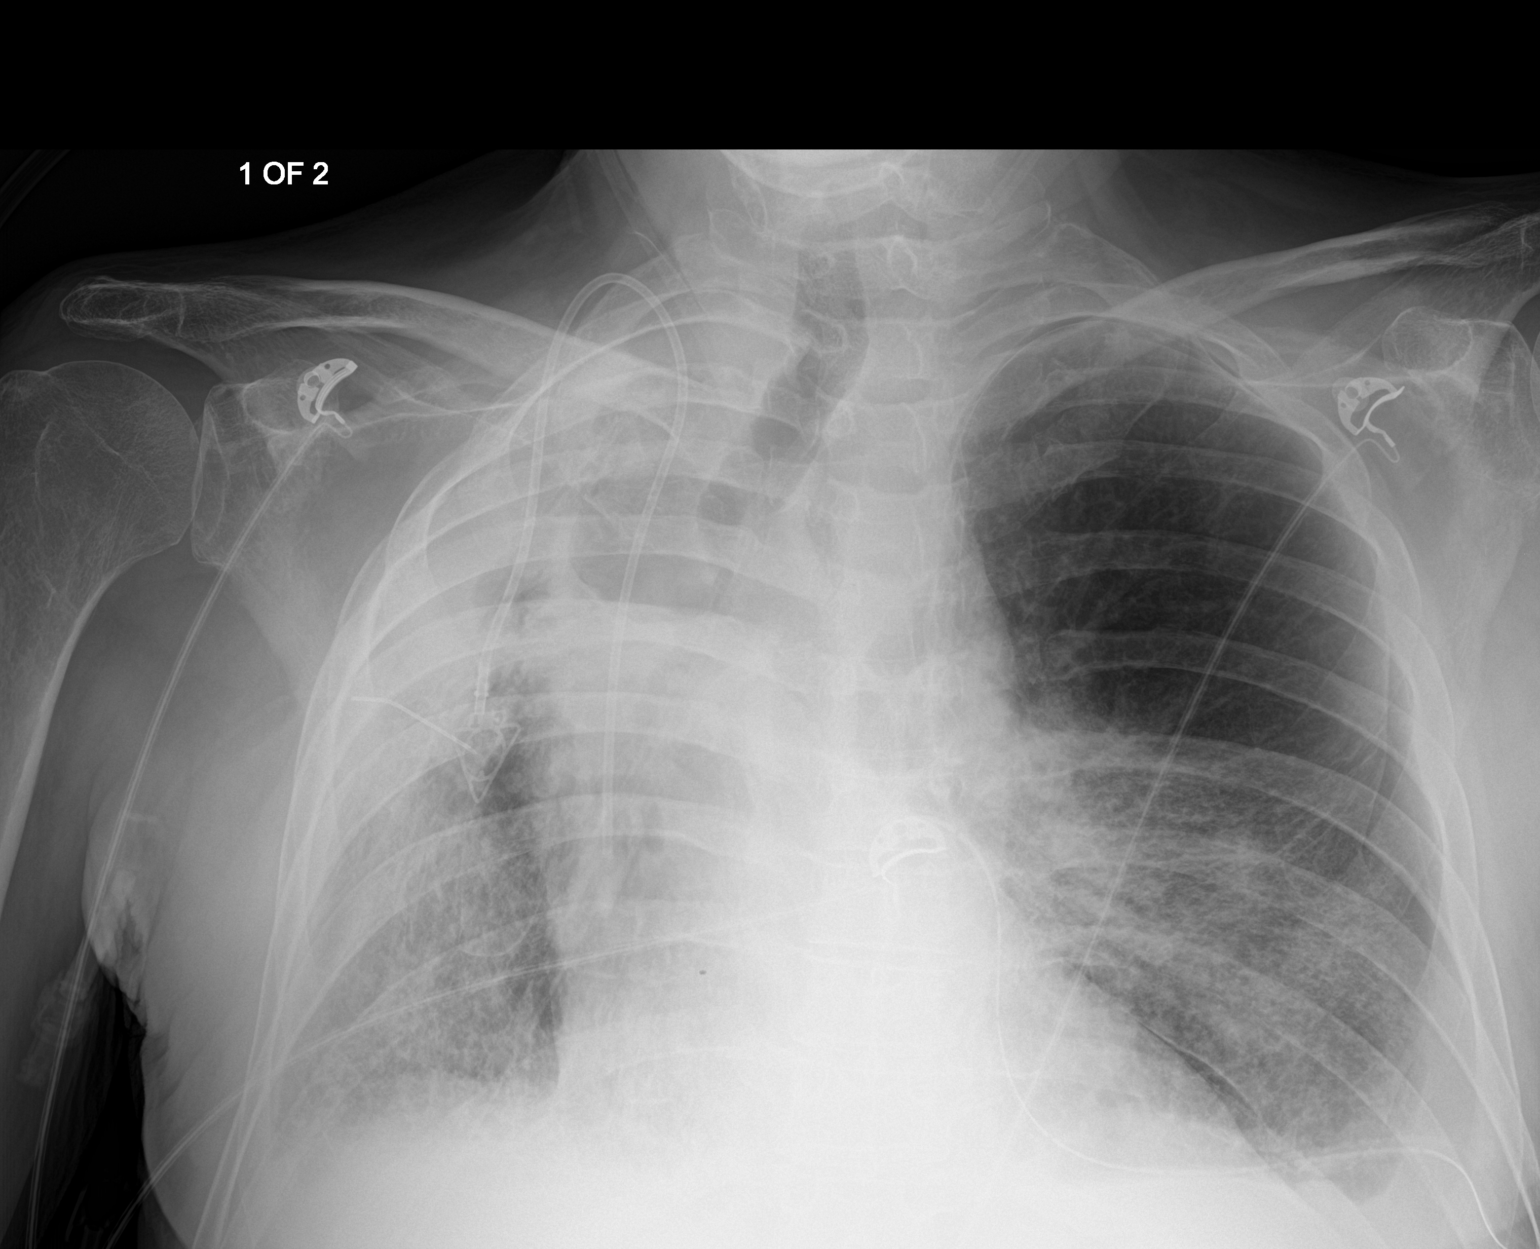

[chest ap (2 of 2)]
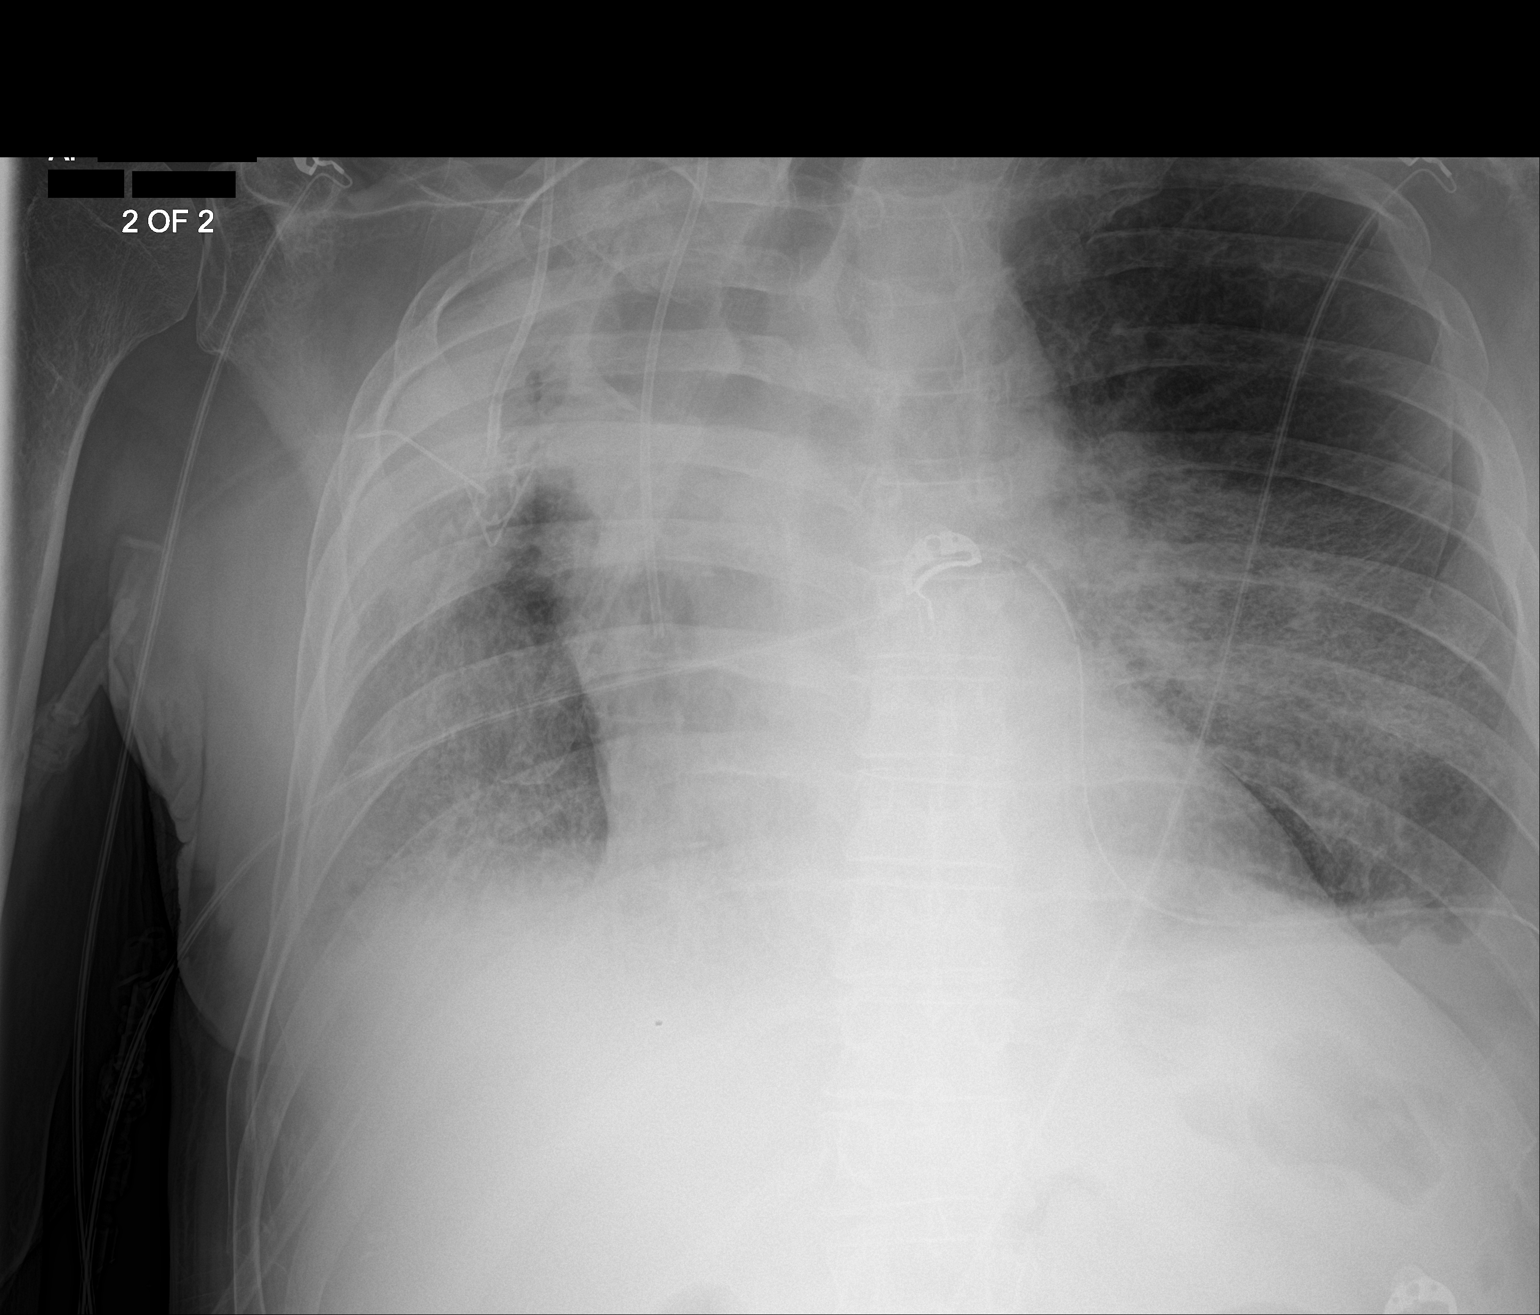

[2 of 2 positions shown; findings below may reference images not displayed]

FINDINGS: Left-sided chest tube. Small bilateral pleural effusions. Bilateral
lower lobe interstitial fibrosis. Patchy areas of left upper lobe
interstitial fibrosis. Cough linear airspace disease in the right
upper lung consistent with post treatment changes. Cavitary area in
the area of confluent airspace disease. Superimposed pneumonia
cannot be excluded. No pneumothorax. Stable cardiomediastinal
silhouette. Right-sided Port-A-Cath. No acute osseous abnormality.
No acute osseous injury of the chest.
IMPRESSION: 1. Left-sided chest tube. Small bilateral pleural effusions, left
greater than right.
2. Persistent bilateral lower lobe interstitial fibrosis. Cough
linear airspace disease in the right upper lung consistent with post
treatment changes. Superimposed pneumonia can not be excluded.

## 2021-08-20 MED ORDER — OXYCODONE HCL ER 10 MG PO T12A
10.0000 mg | EXTENDED_RELEASE_TABLET | Freq: Two times a day (BID) | ORAL | Status: DC
  Administered 2021-08-20 (×2): 10 mg via ORAL
  Filled 2021-08-20 (×3): qty 1

## 2021-08-20 MED ORDER — LORAZEPAM 1 MG PO TABS
1.0000 mg | ORAL_TABLET | Freq: Once | ORAL | Status: DC
  Filled 2021-08-20: qty 1

## 2021-08-20 MED ORDER — SODIUM CHLORIDE 3 % IN NEBU
4.0000 mL | INHALATION_SOLUTION | Freq: Two times a day (BID) | RESPIRATORY_TRACT | Status: DC
  Administered 2021-08-20 – 2021-08-21 (×2): 4 mL via RESPIRATORY_TRACT
  Filled 2021-08-20 (×4): qty 4

## 2021-08-20 MED ORDER — PREDNISONE 20 MG PO TABS
80.0000 mg | ORAL_TABLET | Freq: Every day | ORAL | Status: DC
  Administered 2021-08-20 – 2021-08-21 (×2): 80 mg via ORAL
  Filled 2021-08-20: qty 1
  Filled 2021-08-20: qty 4

## 2021-08-20 MED ORDER — LACTATED RINGERS IV BOLUS
500.0000 mL | Freq: Once | INTRAVENOUS | Status: AC
  Administered 2021-08-20: 500 mL via INTRAVENOUS

## 2021-08-20 MED ORDER — LORAZEPAM 2 MG/ML IJ SOLN
1.0000 mg | Freq: Once | INTRAMUSCULAR | Status: AC
  Administered 2021-08-20: 1 mg via INTRAVENOUS
  Filled 2021-08-20: qty 1

## 2021-08-20 MED ORDER — LORAZEPAM 2 MG/ML IJ SOLN
1.0000 mg | Freq: Once | INTRAMUSCULAR | Status: AC
  Administered 2021-08-20: 1 mg via INTRAVENOUS

## 2021-08-20 MED ORDER — FUROSEMIDE 10 MG/ML IJ SOLN
20.0000 mg | Freq: Once | INTRAMUSCULAR | Status: AC
  Administered 2021-08-20: 20 mg via INTRAVENOUS
  Filled 2021-08-20: qty 2

## 2021-08-20 MED ORDER — LORAZEPAM 1 MG PO TABS
1.0000 mg | ORAL_TABLET | Freq: Four times a day (QID) | ORAL | Status: DC | PRN
  Administered 2021-08-20 – 2021-08-21 (×3): 1 mg via ORAL
  Filled 2021-08-20 (×3): qty 1

## 2021-08-20 NOTE — Progress Notes (Signed)
?  Progress Note ? ? ?Patient: Richard Li XBJ:[id number] DOB: 1960-06-30 DOA: 08/09/2021     3 ?DOS: the patient was seen and examined on 08/20/2021 ?  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  Admitted for acute hypoxic respiratory failure secondary to pneumonia.  Treated with antibiotics without significant improvement raising concern for other abnormalities including effusion, lymphangitic spread.  Seen by pulmonology, PleurX catheter placed 4/28, became tachycardic afterwards and transferred to stepdown for close seen by oncology, concern for adverse drug reaction, immune mediated pneumonitis, therefore Keytruda stopped and started on prednisone. ? ?Assessment and Plan: ?* Acute respiratory failure with hypoxia ([place]) ?Thought secondary to pneumonia, complicated by metastatic lung adenocarcinoma/COPD with chronic bronchitis and left pleural effusion; but now suspect effusion and ADR to [facility]. ?--no significant medical improvement.  Continue antibiotics.  Now s/p Pleurx catheter, given concern for lymphangitic spread, oncology evaluted, suspects ADR and started steroids ?--tachycardic post-procedure, d/w Dr. Erin Fulling, patient transferred to SDU ? ?Multifocal pneumonia ?--abx as above but now a questionable dc ? ?Adenocarcinoma of right lung, stage 2 [facility]) ?--oncology consult appreciated ? ?Pancytopenia ([place]) ?--secondary to chemotherapy ?--WBC has recovered; Hgb stable s/p PRBC; Plts stable ? ?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[place]) ?--appears stable; continue bronchodilators ? ?LE edema, lasix x1  ? ? ?  ? ?Subjective:  ?No nursing notes charted but RT tells me that pt up to 8L last night and respiratory status worse today, now tachycardic up to 130s ? ?Feels worse, had a rough night, poor sleep; more SOB,  hard to speak in full sentences. Anxious, needs home Ativan. ? ?Physical Exam: ?Vitals:  ? 08/20/21 0755 08/20/21 0811 08/20/21 1330 08/20/21 1340  ?BP:  122/71    ?Pulse:  (!) 122  (!) 124  ?Resp:  20  (!) 25  ?Temp:  98.1 ?F (36.7 ?C) 98 ?F (36.7 ?C)   ?TempSrc:  Oral Axillary   ?SpO2: 90% 95%  100%  ?Weight:      ?Height:      ?Level 5 mobility ?Physical Exam ?Vitals and nursing note reviewed.  ?Constitutional:   ?   General: He is not in acute distress. ?   Appearance: He is ill-appearing. He is not toxic-appearing.  ?Cardiovascular:  ?   Rate and Rhythm: Regular rhythm. Tachycardia present.  ?   Heart sounds: No murmur heard. ?Pulmonary:  ?   Effort: Respiratory distress present.  ?   Breath sounds: No wheezing, rhonchi or rales.  ?   Comments: Tachypneic, dyspneic, fatigues easily when speaking ?Musculoskeletal:  ?   Right lower leg: Edema (greater than left; bilaterally pedal in natures) present.  ?   Left lower leg: Edema present.  ?Neurological:  ?   Mental Status: He is alert.  ?Psychiatric:     ?   Mood and Affect: Mood normal.     ?   Behavior: Behavior normal.  ? ? ?Data Reviewed: ? ?No labs currently ? ?Family Communication: wife at bedsdie ? ?Disposition: ?Status is: Inpatient ?Remains inpatient appropriate because: worsening respiratory status, hypoxia, tachycardia, pneumonia, lung cancer, pleural effsion ? ? Planned Discharge Destination:  TBD ? ? ? ?Time spent: 35 minutes ? ?Author: ?Murray Hodgkins, MD ?08/20/2021 1:46 PM ? ?For on call review [email or web address].  ?

## 2021-08-20 NOTE — Progress Notes (Signed)
Required paperwork for outpatient PleurX catheter supplies filled out and faxed to appropriate individual afternoon of 4/28. Original copies placed in medical chart ? ?Devinn Voshell D. Harris, NP-C ?[facility] ?Personal contact information can be found on Amion  ?08/20/2021, 6:27 PM ? ?

## 2021-08-20 NOTE — Procedures (Signed)
PleurX Insertion Procedure Note ? ?Richard Li  ?[id number]  ?[birth year] ? ?Date:08/20/21  ?Time:1:05 PM  ? ?Provider Performing:Aiva Miskell B Parv Manthey ? ?Procedure: PleurX Tunneled Pleural Catheter Placement (48016) ? ?Indication(s) ?Relief of dyspnea from recurrent effusion ? ?Consent ?Risks of the procedure as well as the alternatives and risks of each were explained to the patient and/or caregiver.  Consent for the procedure was obtained. ? ? ?Anesthesia ?Topical only with 1% lidocaine  ? ? ?Time Out ?Verified patient identification, verified procedure, site/side was marked, verified correct patient position, special equipment/implants available, medications/allergies/relevant history reviewed, required imaging and test results available. ? ? ?Sterile Technique ?Maximal sterile technique including sterile barrier drape, hand hygiene, sterile gown, sterile gloves, mask, hair covering. ? ? ? ?Procedure Description ?Ultrasound used to identify appropriate pleural anatomy for placement and overlying skin marked.  Area of drainage cleaned and draped in sterile fashion.   Lidocaine was used to anesthetize the skin and subcutaneous tissue.   1.5 cm incision made overlying fluid and another about 5 cm anterior to this along chest wall.  PleurX catheter inserted in usual sterile fashion using modified seldinger technique.  Interrupted silk sutures placed at catheter insertion and tunneling points which will be removed at later date.  PleurX catheter then hooked to suction.  After fluid aspirated, pleurX capped and sterile dressing applied. ? ? ?Complications/Tolerance ?None; patient tolerated the procedure well. ?Chest X-ray is ordered to confirm no post-procedural complication. ? ? ?EBL ?Minimal ? ? ?Specimen(s) ?none ?  ?

## 2021-08-20 NOTE — Progress Notes (Signed)
Plurex placement completed by Dewald MD. Throughout procedure patient HR maintained 110-115 and SpO2 greater than 97% on 12L. BP maintained 120s during procedure.  ? ?After procedure completed, patient developed increased shortness of breath, Tachycardia, tachypnea and anxiety. Oxygen increased to 15L HFNC w/ NRB for SpO2 83%- lungs wheezy bilaterally. PRN albuterol, EKG and STAT chest x ray obtained per [facility] MD.  ? ?Heart rate up to 130s and BP 190/101. Additional 1 mg Ativan administered per verbal order Dewald MD. After administration of Ativan patient relaxed.  ? ?Patient transferred to ICU/SD for closer monitoring.  ?

## 2021-08-20 NOTE — Progress Notes (Signed)
Pt said he is fine without the bipap at this time. No distress noted but pt is very anxious. He said some rest would be good. RT will continue to monitor. Pt is currently on 15L [place] and O2 saturation is 100%.  ?

## 2021-08-20 NOTE — Progress Notes (Addendum)
HEMATOLOGY-ONCOLOGY PROGRESS NOTE ? ?ASSESSMENT AND PLAN: ?This is a 61 year old male with metastatic non-small cell lung cancer, adenocarcinoma.  He is currently receiving maintenance treatment with Alimta and Keytruda every 3 weeks.  Now admitted with worsening shortness of breath and requiring 10 L of oxygen.  CT scan results reviewed.  He was noted to have some airspace disease concerning for pneumonia.  He was placed on antibiotics but is not having significant improvement in his breathing.  He has been seen by PCCM and they are planning for thoracentesis later today.  May also consider Pleurx catheter placement.  ? ?The question was raised as to whether he may have lymphangitic spread of his tumor.  Unclear if he is limited (versus immune mediated pneumonitis.  Given that he is receiving immunotherapy, immune-mediated pneumonitis may be contributing to his dyspnea.  Therefore, will start him on prednisone 1 mg/kg (80 mg) daily.  This will tapered slowly over the course of several weeks.  We will keep his outpatient appointment scheduled as is for now.  We can certainly adjust these if needed. ? ?Richard Bussing, Richard Li, Richard Li, Richard Li ? ?SUBJECTIVE: Richard Li is followed at the cancer center for metastatic non-small cell lung cancer, adenocarcinoma.  He is currently receiving treatment with palliative therapy consisting of maintenance Alimta and Keytruda every 3 weeks.  His last treatment was performed on 08/05/2021.  He presented to the hospital for progressive dyspnea and had a cough with yellow sputum production.  He was admitted for treatment for community-acquired pneumonia and recurrent pleural effusion.  He has been receiving antibiotics without significant improvement in his respiratory status.  He remains on O2 at 10 L.  Pulmonology has seen the patient and is planning for thoracentesis today.  He is also under consideration for Pleurx catheter placement.  He continues to have shortness of breath.  He  tells me that he walked to the bathroom last night and desatted into the 70s.  It took some time to get his O2 sat back up.  He reports some anxiety today.  He was started on his home dose of lorazepam earlier today.  He is try to take this pain medication. ? ?Oncology History  ?Adenocarcinoma of right lung, stage 2 ([place])  ?01/17/2020 Initial Diagnosis  ? Adenocarcinoma of right lung, stage 2 ([place]) ? ?  ?02/10/2020 - 03/09/2020 Chemotherapy  ?  ? ?  ? ?  ?09/24/2020 -  Chemotherapy  ? Patient is on Treatment Plan : LUNG CARBOplatin / Pemetrexed / Pembrolizumab q21d Induction x 4 cycles / Maintenance Pemetrexed + Pembrolizumab  ? ?  ?  ?Adenocarcinoma of right lung, stage 4 ([place])  ?08/28/2020 Initial Diagnosis  ? Adenocarcinoma of right lung, stage 4 ([place]) ? ?  ?08/28/2020 Cancer Staging  ? Staging form: Lung, AJCC 8th Edition ?- Clinical: Stage IVA (cT3, cN0, cM1b) - Signed by Curt Bears, MD on 08/28/2020 ? ?  ? ? ? ?REVIEW OF SYSTEMS:   ?Review of Systems  ?Constitutional:  Negative for chills and fever.  ?HENT: Negative.    ?Eyes: Negative.   ?Respiratory:  Positive for cough, sputum production and shortness of breath. Negative for hemoptysis.   ?Cardiovascular: Negative.   ?Gastrointestinal: Negative.   ?Genitourinary: Negative.   ?Skin: Negative.   ?Neurological: Negative.   ?Psychiatric/Behavioral:  The patient is nervous/anxious.   ? ?I have reviewed the past medical history, past surgical history, social history and family history with the patient and they are unchanged from previous note. ? ? ?  PHYSICAL EXAMINATION: ?ECOG PERFORMANCE STATUS: 2 - Symptomatic, <50% confined to bed ? ?Vitals:  ? 08/20/21 0755 08/20/21 0811  ?BP:  122/71  ?Pulse:  (!) 122  ?Resp:  20  ?Temp:  98.1 ?F (36.7 ?C)  ?SpO2: 90% 95%  ? ?Filed Weights  ? 08/18/2021 3762  ?Weight: 77.1 kg  ? ? ?Intake/Output from previous day: ?04/27 0701 - 04/28 0700 ?In: 1560.6 [P.O.:1200; I.V.:120; IV Piggyback:240.6] ?Out: -  ? ?Physical Exam ?Vitals  reviewed.  ?HENT:  ?   Head: Normocephalic.  ?Eyes:  ?   General: No scleral icterus. ?   Conjunctiva/sclera: Conjunctivae normal.  ?Cardiovascular:  ?   Rate and Rhythm: Tachycardia present.  ?Pulmonary:  ?   Comments: Diminished breath sounds on the left ?Skin: ?   General: Skin is warm and dry.  ?Neurological:  ?   Mental Status: He is alert and oriented to person, place, and time.  ? ? ?LABORATORY DATA:  ?I have reviewed the data as listed ? ?  Latest Ref Rng & Units 08/18/2021  ?  4:47 AM 08/17/2021  ?  4:06 AM 08/12/2021  ?  7:49 AM  ?CMP  ?Glucose 70 - 99 mg/dL 82   116   111    ?BUN 6 - 20 mg/dL 8   13   13     ?Creatinine 0.61 - 1.24 mg/dL 0.72   0.74   0.72    ?Sodium 135 - 145 mmol/L 138   138   139    ?Potassium 3.5 - 5.1 mmol/L 3.4   4.4   3.4    ?Chloride 98 - 111 mmol/L 102   105   106    ?CO2 22 - 32 mmol/L 28   29   26     ?Calcium 8.9 - 10.3 mg/dL 8.8   8.7   8.9    ?Total Protein 6.5 - 8.1 g/dL  5.5   5.9    ?Total Bilirubin 0.3 - 1.2 mg/dL  0.8   0.6    ?Alkaline Phos 38 - 126 U/L  137   152    ?AST 15 - 41 U/L  37   47    ?ALT 0 - 44 U/L  28   32    ? ? ?Lab Results  ?Component Value Date  ? WBC 6.5 08/19/2021  ? HGB 9.6 (L) 08/19/2021  ? HCT 28.7 (L) 08/19/2021  ? MCV 98.3 08/19/2021  ? PLT 126 (L) 08/19/2021  ? NEUTROABS 2.9 08/12/2021  ? ? ?No results found for: CEA1, CEA, [id number], CA125, PSA1 ? ?DG Chest 1 View ? ?Result Date: 08/10/2021 ?CLINICAL DATA:  S/p thoracentesis. EXAM: PORTABLE CHEST 1 VIEW COMPARISON:  Chest XR and IR ultrasound, earlier same day. CT chest, 08/10/2021. FINDINGS: Support lines: RIGHT chest port catheter tip at the distal SVC overlying pacer leads. Similar findings of chronic-appearing volume loss, pleuroparenchymal thickening involving the RIGHT upper chest, with rightward deviation of the mediastinal structures. Linear, coarse interstitial thickening involving the RIGHT lower lobe. Improved dilation of the LEFT lung with trace residual pleural effusion. No pneumothorax.  No interval osseous abnormality. IMPRESSION: 1. Trace residual LEFT pleural effusion.  No pneumothorax. 2. Chronic changes RIGHT upper chest, with worsening of interstitial thickening within the RIGHT lower lobe. Findings suspicious for superimposed infection. Electronically Signed   By: Michaelle Birks M.D.   On: 08/10/2021 16:40  ? ?DG Chest 2 View ? ?Result Date: 08/10/2021 ?CLINICAL DATA:  Shortness of breath EXAM: CHEST -  2 VIEW COMPARISON:  07/30/2021 FINDINGS: Right-sided chest port remains in place. Volume loss with extensive pleuroparenchymal scarring at the right lung apex, similar in appearance to the previous study. Chronic blunting of the right costophrenic angle, likely a component of scarring. Increased interstitial markings within the right lower lobe. Persistent small left pleural effusion with likely loculated component. Heart size within normal limits. No pneumothorax. IMPRESSION: 1. Persistent small left pleural effusion with likely loculated component. 2. Increased interstitial markings within the right lower lobe, may represent developing infiltrate. Electronically Signed   By: Davina Poke D.O.   On: 08/10/2021 10:40  ? ?DG Chest 2 View ? ?Result Date: 07/30/2021 ?CLINICAL DATA:  Left pleural effusion.  Shortness of breath. EXAM: CHEST - 2 VIEW COMPARISON:  07/15/2021 FINDINGS: Volume loss right hemithorax is stable with extensive pleuroparenchymal scarring in the right apex. Small left pleural effusion is similar to prior, likely with loculated component. The cardiopericardial silhouette is within normal limits for size. Bones are diffusely demineralized. IMPRESSION: Similar appearance of small left pleural effusion, likely with loculated component. Electronically Signed   By: Misty Stanley M.D.   On: 07/30/2021 11:39  ? ?CT Angio Chest PE W and/or Wo Contrast ? ?Result Date: 08/09/2021 ?CLINICAL DATA:  Progressive shortness of breath. Hypoxia. Tachypnea. Personal history of lung cancer and  COPD. Nonspecific abdominal pain. EXAM: CT ANGIOGRAPHY CHEST CT ABDOMEN AND PELVIS WITH CONTRAST TECHNIQUE: Multidetector CT imaging of the chest was performed using the standard protocol during bolus administ

## 2021-08-20 NOTE — Progress Notes (Signed)
? ?  NAME:  Richard Li, MRN:  [id number], DOB:  25-Aug-1960, LOS: 3 ?ADMISSION DATE:  08/04/2021, CONSULTATION DATE:  08/19/2021 ?REFERRING MD:  Dr. Sarajane Jews, CHIEF COMPLAINT:  Recurrent left pleural effusion   ? ?History of Present Illness:  ?Richard Li is a 61 y.o. male with a PMH significant for  Metastatic lung cancer with mets to the right kidney currently undergoing chemo, recurrent malignant pleural effusion, HTN, HLD, GERD, asthma, and anxiety who presented to [facility] ED for complaints of progressive acute on chronic dyspnea. Also reported progressive cough with yellow sputum produced. He was admitted per [facility] for management of CAP complicated by metastatic lung cancer with recurrent pleural effusion.  ? ?PCCM was consulted for assistance in management of malignant pleural effusion. Of note patient has had 3 thoracentesis within the last few months with most recent tap being 08/10/21 with 1.7L of hazy amber fluid removed. Patient preferred to continue with thoracentesis instead of placement of PleurX on last visit with pulmonary  ? 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 ?Significant Hospital Events: ?Including procedures, antibiotic start and stop dates in addition to other pertinent events   ?4/24 presented with progressive dyspnea, admitted for treatment of CAP ?4/27 PCCM consulted for assistance in management of recurrent malignant pleural effusion ?4/28 remains on 10 L nasal cannula this a.m. with mild increase shortness of breath.  Spoke with patient regarding plan and agrees to proceed with Pleurx catheter placement this afternoon ? ?Interim History / Subjective:  ?Patient and spouse seen at bedside with agreement to proceed with Pleurx catheter. ? ?Objective   ?Blood pressure 122/71, pulse (!) 122, temperature 98.1 ?F (36.7 ?C), temperature source Oral, resp. rate  20, height 5\' 9"  (1.753 m), weight 77.1 kg, SpO2 95 %. ?   ?   ? ?Intake/Output Summary (Last 24 hours) at 08/20/2021 1011 ?Last data filed at 08/20/2021 0400 ?Gross per 24 hour  ?Intake 1320.6 ml  ?Output --  ?Net 1320.6 ml  ? ? ?Filed Weights  ? 08/20/2021 9485  ?Weight: 77.1 kg  ? ? ?Examination: ?General: Acute on chronic ill-appearing elderly gentleman sitting on edge of bed in no acute distress ?HEENT: [place]/AT, MM pink/moist, PERRL,  ?Neuro: Alert and oriented x3, nonfocal ?CV: s1s2 regular rate and rhythm, no murmur, rubs, or gallops,  ?PULM: Mild increased work of breathing, no accessory muscle use seen, on 10 L nasal cannula ?GI: soft, bowel sounds active in all 4 quadrants, non-tender, non-distended, tolerating oral diet ?Extremities: warm/dry, no edema  ?Skin: no rashes or lesions ? ? ?Resolved Hospital Problem list   ? ? 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 Julien Nordmann  ?Concern for malignant pleural effusion  ?-Has had multiple thoracentesis with no malignancy on cytology but high suspicion given lymphocyte predominance  ?P: ?Plan to proceed with insertion of Pleurx catheter this a.m. ?Family education regarding Pleurx management ?Obtain pleural studies post Pleurx catheter placement ?Continue empiric cefepime ?Encourage pulmonary hygiene ?Mobilize as able ?Oncology following, appreciate assistance ? ?Best Practice (right click and "Reselect all SmartList Selections" daily)  ? ?Per primary  ? ?Critical care time: NA  ?Deanza Upperman D. Harris, NP-C ?[facility] ?Personal contact information can be found on Amion  ?08/20/2021, 10:11 AM ? ? ? ? ? ? ?

## 2021-08-21 ENCOUNTER — Inpatient Hospital Stay (HOSPITAL_COMMUNITY): Payer: BC Managed Care – PPO

## 2021-08-21 DIAGNOSIS — J189 Pneumonia, unspecified organism: Secondary | ICD-10-CM | POA: Diagnosis not present

## 2021-08-21 DIAGNOSIS — C3491 Malignant neoplasm of unspecified part of right bronchus or lung: Secondary | ICD-10-CM | POA: Diagnosis not present

## 2021-08-21 DIAGNOSIS — J449 Chronic obstructive pulmonary disease, unspecified: Secondary | ICD-10-CM | POA: Diagnosis not present

## 2021-08-21 DIAGNOSIS — J9601 Acute respiratory failure with hypoxia: Secondary | ICD-10-CM | POA: Diagnosis not present

## 2021-08-21 LAB — PROCALCITONIN: Procalcitonin: 1.08 ng/mL

## 2021-08-21 LAB — RESPIRATORY PANEL BY PCR

## 2021-08-21 LAB — COMPREHENSIVE METABOLIC PANEL
ALT: 18 U/L (ref 0–44)
AST: 30 U/L (ref 15–41)
Albumin: 2.3 g/dL — ABNORMAL LOW (ref 3.5–5.0)
Alkaline Phosphatase: 144 U/L — ABNORMAL HIGH (ref 38–126)
Anion gap: 7 (ref 5–15)
BUN: 16 mg/dL (ref 6–20)
CO2: 29 mmol/L (ref 22–32)
Calcium: 9.1 mg/dL (ref 8.9–10.3)
Chloride: 102 mmol/L (ref 98–111)
Creatinine, Ser: 0.61 mg/dL (ref 0.61–1.24)
GFR, Estimated: >60 mL/min (ref >60)
Glucose, Bld: 108 mg/dL — ABNORMAL HIGH (ref 70–99)
Potassium: 4.3 mmol/L (ref 3.5–5.1)
Sodium: 138 mmol/L (ref 135–145)
Total Bilirubin: 1 mg/dL (ref 0.3–1.2)
Total Protein: 6.6 g/dL (ref 6.5–8.1)

## 2021-08-21 LAB — SEDIMENTATION RATE: Sed Rate: 113 mm/hr — ABNORMAL HIGH (ref 0–16)

## 2021-08-21 LAB — LACTATE DEHYDROGENASE: LDH: 216 U/L — ABNORMAL HIGH (ref 98–192)

## 2021-08-21 LAB — BRAIN NATRIURETIC PEPTIDE: B Natriuretic Peptide: 273.2 pg/mL — ABNORMAL HIGH (ref 0.0–100.0)

## 2021-08-21 LAB — LACTIC ACID, PLASMA
Lactic Acid, Venous: 1 mmol/L (ref 0.5–1.9)
Lactic Acid, Venous: 1.1 mmol/L (ref 0.5–1.9)

## 2021-08-21 LAB — C-REACTIVE PROTEIN: CRP: 20.8 mg/dL — ABNORMAL HIGH (ref <1.0)

## 2021-08-21 IMAGING — DX DG CHEST 1V PORT
2 series · 2 of 2 positions shown · non-contrast
Comparison: [DATE]

CLINICAL DATA: Respiratory failure

EXAM:
PORTABLE CHEST 1 VIEW

[chest ap (1 of 2)]
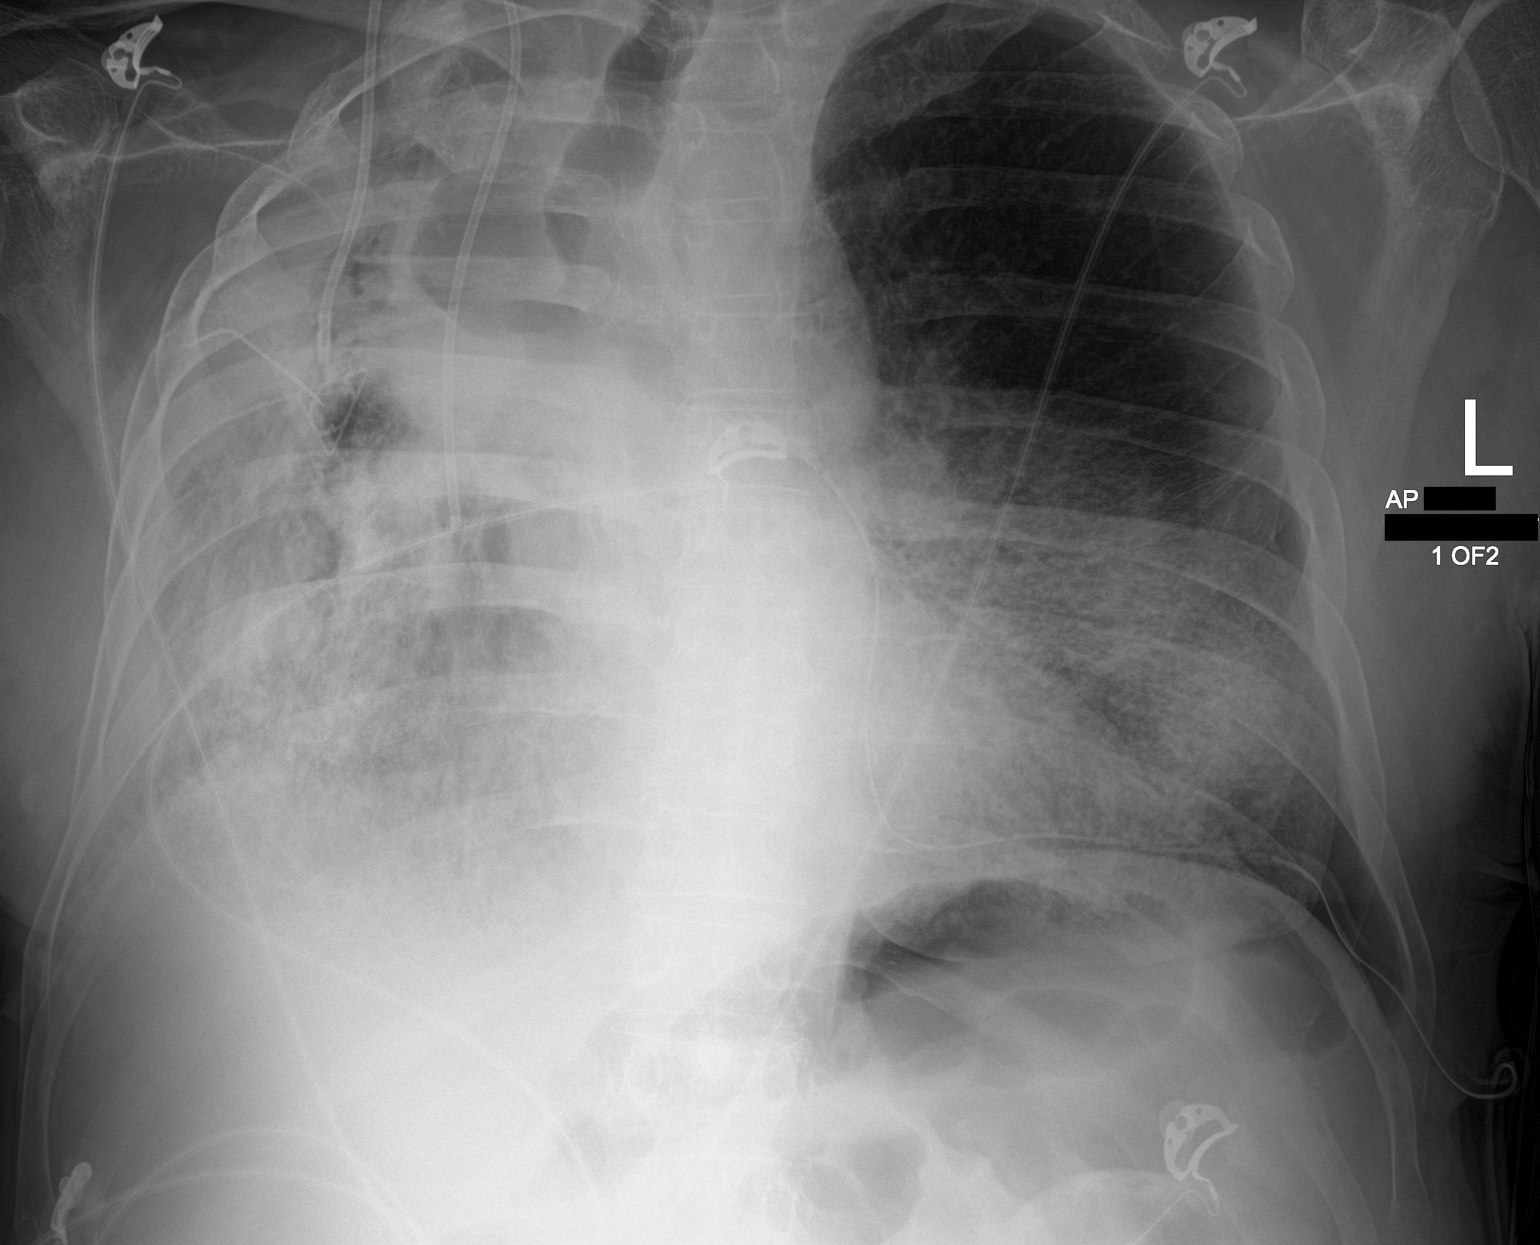

[chest ap (2 of 2)]
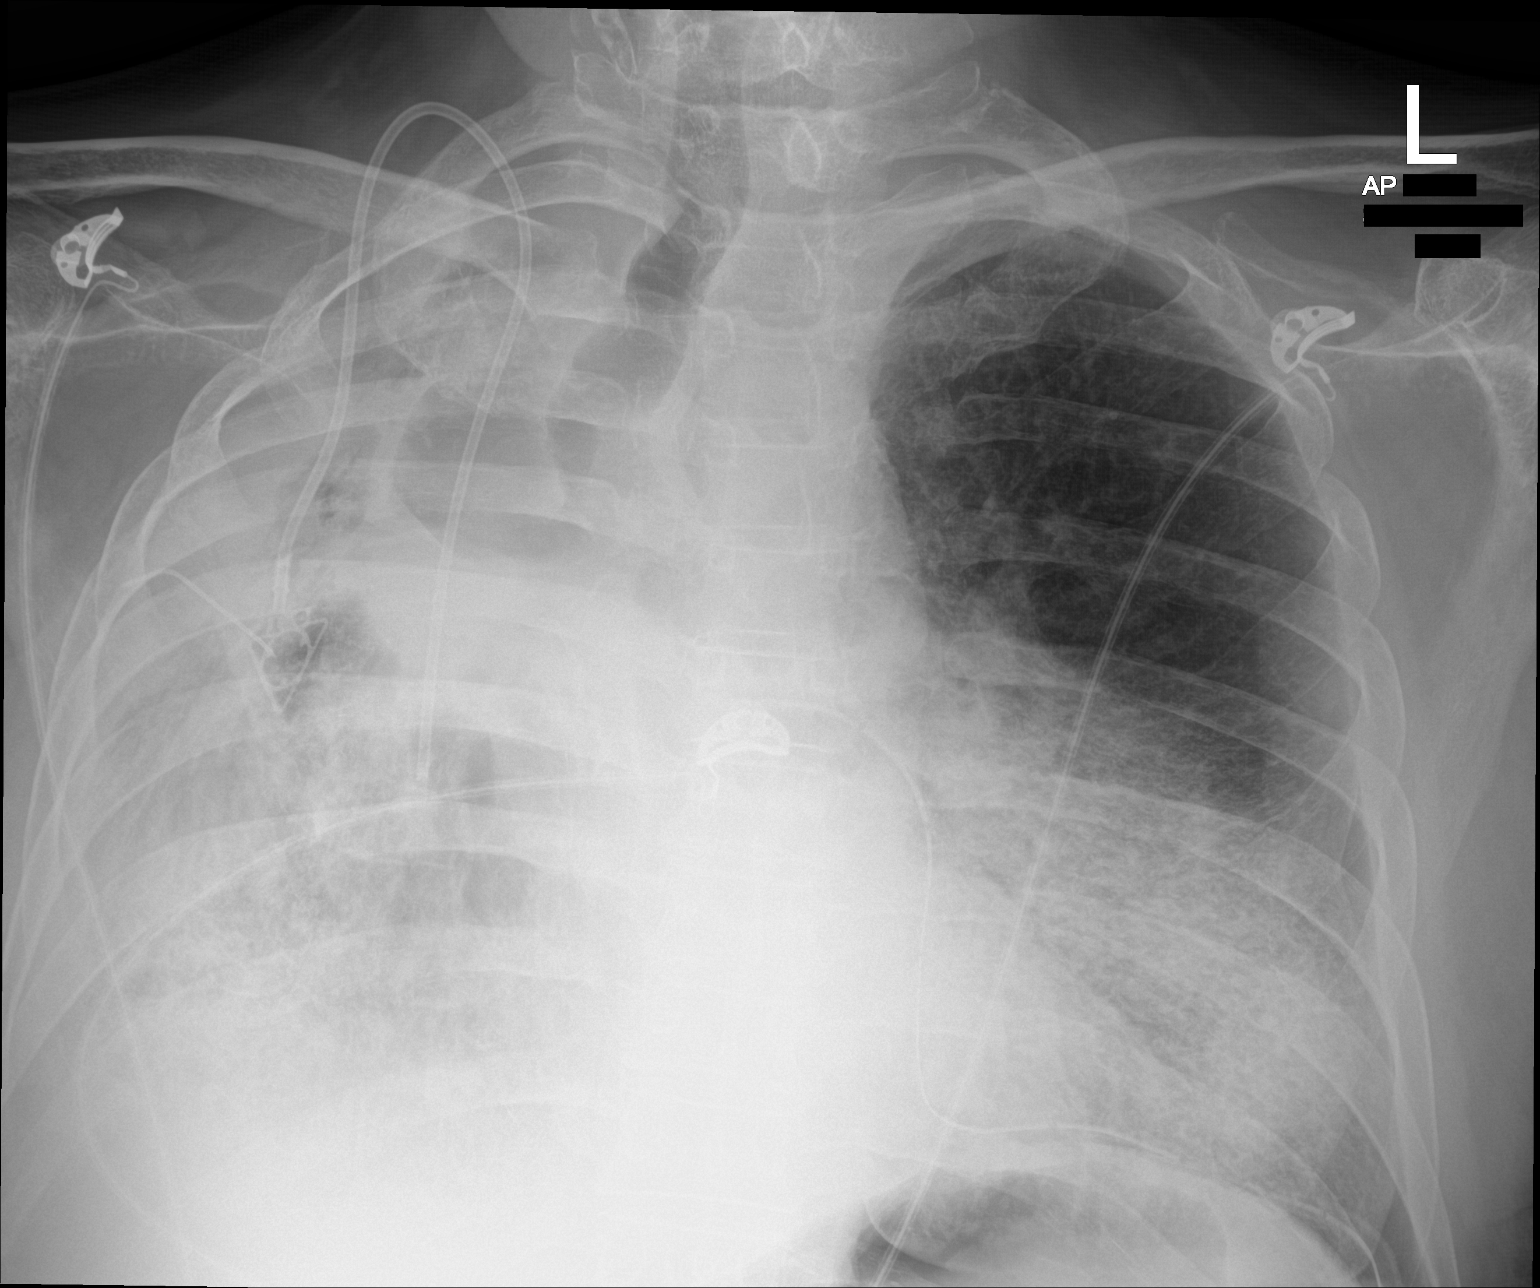

[2 of 2 positions shown; findings below may reference images not displayed]

FINDINGS: Right chest port and left basilar chest tube remains in place.
Decreasing volume of left-sided pleural effusion. New left basilar
pneumothorax, estimated 5-10% volume. Similar degree of volume loss
within the right hemithorax with rightward shift of the heart and
mediastinum. Increasing interstitial opacities within the left lower
lobe. Similar degree of opacification throughout the right
hemithorax with cavitary changes at the right lung apex and stable
right-sided pleural effusion.
IMPRESSION: 1. New left basilar pneumothorax, estimated 5-10% volume. Left
basilar chest tube remains in place.
2. Increasing interstitial opacities within the left lower lobe.
3. Persistent opacification throughout the right lung with pleural
effusion and volume loss.

These results will be called to the ordering clinician or
representative by the Radiologist Assistant, and communication
documented in the PACS or [REDACTED].

## 2021-08-21 MED ORDER — ACETAMINOPHEN 325 MG PO TABS: 650.0000 mg | ORAL_TABLET | Freq: Four times a day (QID) | ORAL | Status: DC | PRN

## 2021-08-21 MED ORDER — ONDANSETRON 4 MG PO TBDP: 4.0000 mg | ORAL_TABLET | Freq: Four times a day (QID) | ORAL | Status: DC | PRN

## 2021-08-21 MED ORDER — ONDANSETRON HCL 4 MG/2ML IJ SOLN: 4.0000 mg | Freq: Four times a day (QID) | INTRAMUSCULAR | Status: DC | PRN

## 2021-08-21 MED ORDER — METHYLPREDNISOLONE SODIUM SUCC 40 MG IJ SOLR
40.0000 mg | Freq: Once | INTRAMUSCULAR | Status: AC
  Administered 2021-08-21: 40 mg via INTRAVENOUS
  Filled 2021-08-21: qty 1

## 2021-08-21 MED ORDER — GLYCOPYRROLATE 0.2 MG/ML IJ SOLN
0.2000 mg | INTRAMUSCULAR | Status: DC | PRN
Start: 2021-08-21 — End: 2021-08-22

## 2021-08-21 MED ORDER — GLYCOPYRROLATE 0.2 MG/ML IJ SOLN: 0.2000 mg | INTRAMUSCULAR | Status: DC | PRN

## 2021-08-21 MED ORDER — SODIUM CHLORIDE 0.9 % IV SOLN
500.0000 mg | Freq: Once | INTRAVENOUS | Status: AC
  Administered 2021-08-21: 500 mg via INTRAVENOUS
  Filled 2021-08-21: qty 5

## 2021-08-21 MED ORDER — POLYVINYL ALCOHOL 1.4 % OP SOLN
1.0000 [drp] | Freq: Four times a day (QID) | OPHTHALMIC | Status: DC | PRN
  Filled 2021-08-21: qty 15

## 2021-08-21 MED ORDER — MORPHINE 100MG IN NS 100ML (1MG/ML) PREMIX INFUSION
1.0000 mg/h | INTRAVENOUS | Status: DC
  Administered 2021-08-21: 1 mg/h via INTRAVENOUS
  Filled 2021-08-21: qty 100

## 2021-08-21 MED ORDER — LORAZEPAM 2 MG/ML IJ SOLN
2.0000 mg | Freq: Once | INTRAMUSCULAR | Status: AC
  Administered 2021-08-21: 2 mg via INTRAVENOUS
  Filled 2021-08-21: qty 1

## 2021-08-21 MED ORDER — ACETAMINOPHEN 650 MG RE SUPP: 650.0000 mg | Freq: Four times a day (QID) | RECTAL | Status: DC | PRN

## 2021-08-21 MED ORDER — LORAZEPAM 1 MG PO TABS
1.0000 mg | ORAL_TABLET | Freq: Three times a day (TID) | ORAL | Status: DC
Start: 2021-08-21 — End: 2021-08-21
  Administered 2021-08-21: 1 mg via ORAL
  Filled 2021-08-21: qty 1

## 2021-08-21 MED ORDER — DEXMEDETOMIDINE HCL IN NACL 200 MCG/50ML IV SOLN
0.4000 ug/kg/h | INTRAVENOUS | Status: DC
  Administered 2021-08-21: 0.5 ug/kg/h via INTRAVENOUS
  Administered 2021-08-21: 0.4 ug/kg/h via INTRAVENOUS
  Filled 2021-08-21 (×2): qty 50

## 2021-08-21 MED ORDER — MORPHINE SULFATE (PF) 2 MG/ML IV SOLN
2.0000 mg | INTRAVENOUS | Status: DC | PRN
  Administered 2021-08-21 (×2): 2 mg via INTRAVENOUS
  Filled 2021-08-21 (×3): qty 1

## 2021-08-21 MED ORDER — METOPROLOL TARTRATE 5 MG/5ML IV SOLN
5.0000 mg | Freq: Four times a day (QID) | INTRAVENOUS | Status: DC
  Administered 2021-08-21: 5 mg via INTRAVENOUS
  Filled 2021-08-21: qty 5

## 2021-08-21 MED ORDER — GLYCOPYRROLATE 1 MG PO TABS: 1.0000 mg | ORAL_TABLET | ORAL | Status: DC | PRN

## 2021-08-21 MED ORDER — MORPHINE SULFATE (PF) 2 MG/ML IV SOLN
2.0000 mg | INTRAVENOUS | Status: DC | PRN
  Administered 2021-08-21 (×2): 2 mg via INTRAVENOUS
  Filled 2021-08-21 (×2): qty 1

## 2021-08-21 MED ORDER — METOPROLOL TARTRATE 5 MG/5ML IV SOLN
5.0000 mg | Freq: Once | INTRAVENOUS | Status: AC
  Administered 2021-08-21: 5 mg via INTRAVENOUS
  Filled 2021-08-21: qty 5

## 2021-08-21 MED ORDER — DIPHENHYDRAMINE HCL 50 MG/ML IJ SOLN
25.0000 mg | INTRAMUSCULAR | Status: DC | PRN
Start: 2021-08-21 — End: 2021-08-22

## 2021-08-21 MED ORDER — POLYETHYLENE GLYCOL 3350 17 G PO PACK
17.0000 g | PACK | Freq: Two times a day (BID) | ORAL | Status: DC
  Filled 2021-08-21: qty 1

## 2021-08-21 MED ORDER — LORAZEPAM 2 MG/ML IJ SOLN: 2.0000 mg | INTRAMUSCULAR | Status: DC | PRN

## 2021-08-22 DIAGNOSIS — G9341 Metabolic encephalopathy: Secondary | ICD-10-CM

## 2021-08-22 DIAGNOSIS — T50905A Adverse effect of unspecified drugs, medicaments and biological substances, initial encounter: Secondary | ICD-10-CM

## 2021-08-22 DIAGNOSIS — J189 Pneumonia, unspecified organism: Secondary | ICD-10-CM

## 2021-08-22 DIAGNOSIS — J910 Malignant pleural effusion: Secondary | ICD-10-CM

## 2021-08-23 NOTE — Progress Notes (Signed)
? ?NAME:  Richard Li, MRN:  [id number], DOB:  [birth year], LOS: 4 ?ADMISSION DATE:  08/12/2021, CONSULTATION DATE:  08/19/2021 ?REFERRING MD:  Dr. Sarajane Jews, CHIEF COMPLAINT:  Recurrent left pleural effusion   ? ?History of Present Illness:  ?Richard Li is a 61 y.o. male with a PMH significant for  Metastatic lung cancer with mets to the right kidney currently undergoing chemo, recurrent malignant pleural effusion, HTN, HLD, GERD, asthma, and anxiety who presented to [facility] ED for complaints of progressive acute on chronic dyspnea. Also reported progressive cough with yellow sputum produced. He was admitted per [facility] for management of CAP complicated by metastatic lung cancer with recurrent pleural effusion.  ? ?PCCM was consulted for assistance in management of malignant pleural effusion. Of note patient has had 3 thoracentesis within the last few months with most recent tap being 08/10/21 with 1.7L of hazy amber fluid removed. Patient preferred to continue with thoracentesis instead of placement of PleurX on last visit with pulmonary  ? 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 ?Significant Hospital Events: ?Including procedures, antibiotic start and stop dates in addition to other pertinent events   ?4/24 presented with progressive dyspnea, admitted for treatment of CAP ?4/27 PCCM consulted for assistance in management of recurrent malignant pleural effusion ?4/28 remains on 10 L nasal cannula this a.m. with mild increase shortness of breath.  Spoke with patient regarding plan and agrees to proceed with Pleurx catheter placement this afternoon ?4/29: PleurX catheter placed, patient developed increasing respiratory distress - transferred to ICU  ? ?Interim History / Subjective:  ? ?Patient with worsening shortness of breath. Reports he had hard time breathing all night.   ? ?Placed on bipap this morning.  ? ?Chest x-ray shows bibasilar progressive interstitial infiltrates. Left basilar small pneumothorax noted on todays chest x-ray. ? ?Objective   ?Blood pressure (!) 154/77, pulse (!) 124, temperature (!) 97 ?F (36.1 ?C), temperature source Axillary, resp. rate (!) 32, height 5' 9" (1.753 m), weight 77.1 kg, SpO2 92 %. ?   ?FiO2 (%):  [100 %] 100 %  ? ?Intake/Output Summary (Last 24 hours) at Sep 06, 2021 0809 ?Last data filed at Sep 06, 2021 0500 ?Gross per 24 hour  ?Intake 679.98 ml  ?Output 4100 ml  ?Net -3420.02 ml  ? ?Filed Weights  ? 08/02/2021 8280  ?Weight: 77.1 kg  ? ? ?Examination: ?General: Acute on chronic ill-appearing male, moderate respiratory distress ?HEENT: [place]/AT, MM pink/moist, PERRL ?Neuro: Alert and oriented x3, nonfocal ?CV:  tachycardic, no murmur, rubs, or gallops,  ?PULM: tachypnea, accessory muscle use, diminshed breath sounds bilateral and scattered rales. ?GI: soft, bowel sounds active in all 4 quadrants, non-tender, non-distended, tolerating oral diet ?Extremities: warm/dry, no edema  ?Skin: no rashes or lesions ? ? ?Resolved Hospital Problem list   ? ? ?Assessment & Plan:  ?Metastatic lung cancer with mets to the right kidney  ?Acute on Chronic Hypoxemic Respiratory Failure ?Progressive Bilateral Interstitial Infiltrates ?Bilateral Pleural Effusions - s/p pleurX on left ?Small Left Basilar Pneumothorax ? ?Discussion: ?Patient has progressive respiratory failure in setting of metastatic lung cancer with recent treatments on pemetrexed and pembrolizumab. Differential includes pneumonitis reaction to pemetrexed/pembro vs infectious pneumonia vs re-expansion pulmonary edema. Steroids started on 4/28 by Dr. Julien Nordmann. He has been on cefepime since admission. Left pneumothorax is small and chest tube is in place. ? ?P: ?- Add 45m solumedrol  to the 46m of prednisone for possible pneumonitis ?- Continue chest tube for pneumothorax ?- Check BNP, LDH, procal, CRP, ESR,  resp viral pcr panel ?- Continue cefepime and will add azithromycin for aytpical coverage. MRSA PCR is negative. ?- Continue nebs ?- Continue bipap and morphine for work of breathing.  ? ?Overall very concerning decline in respiratory status over past 24 hours. He is DNR/DNI. If he continues to decline, may need to consider transition to comfort care. ? ? ?Best Practice (right click and "Reselect all SmartList Selections" daily)  ? ?Per primary  ? ?Critical care time: 35 minutes  ? ?JFreda Jackson MD ?[facility] ?Office: [phone]? ? ?See Amion for personal pager ?PCCM on call pager [phone] 7pm. ?Please call Elink 7p-7a. [phone]? ? ? ? ? ? ?

## 2021-08-23 NOTE — IPAL (Signed)
?  Interdisciplinary Goals of Care Family Meeting ? ? ?Date carried out: 05-Sep-2021 ? ?Location of the meeting: Bedside ? ?Member's involved: Physician and Family Member or next of kin wife, daughter, 2 nieces  ? ?Durable Power of Tour manager: wife   ? ?Discussion: We discussed goals of care for Richard Li .  Patient with progressive respiratory failure requiring BiPAP, progressive encephalopathy, anxiety and restlessness requiring Precedex infusion.  Terminal prognosis given lung cancer, suspected pneumonitis and progression of malignancy along with possible pneumonia.  Struggling to breathe, uncomfortable, his wishes would have been to be made comfortable.  Wife and daughter and family agree and wish to proceed with full comfort care once other family members arrive.  Will initiate morphine infusion prior to removing BiPAP. ? ?Code status: Full DNR ? ?Disposition: Continue current acute care until additional family arrives, then proceed with full comfort care ? ?Time spent for the meeting: 10 minutes, as well as multiple discussions throughout the day with wife. ? ?Discussed separately with Dr. Erin Fulling. ? ?Murray Hodgkins, MD ? ?09-05-21, 6:57 PM ? ? ?

## 2021-08-23 NOTE — Progress Notes (Signed)
RT NOTE: ? ?Pt in acute respiratory distress, pt placed on BiPAP to help decrease his WOB. Pt states he feels comfortable on the BiPAP at this time. CCMD aware. ?

## 2021-08-23 NOTE — Progress Notes (Signed)
?Progress Note ? ? ?Patient: Richard Li BJY:[id number] DOB: 1960/09/19 DOA: 08/05/2021     4 ?DOS: the patient was seen and examined on Aug 29, 2021 ?  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  Admitted for acute hypoxic respiratory failure secondary to pneumonia.  Treated with antibiotics without significant improvement raising concern for other causes including effusion-mediated, immune-mediated pneumonitis and lymphangitic spread.  Seen by pulmonology, PleurX catheter placed 4/28, became tachycardic afterwards and transferred to stepdown. Seen by oncology, concern for immune mediated pneumonitis, therefore Keytruda stopped and started on prednisone.  Respiratory distress is marked, started on BiPAP, prognosis guarded, may not survive hospitalization. ? 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 ?--s/p Pleurx catheter 4/28,started on high dose prednisone for 2 weeks, plan outpt f/u with Dr. Willette Alma and repeat scan in 5-6 weeks. ?--per Dr. Julien Nordmann "If he has no improvement on the treatment with prednisone, then this is likely to be progressive disease and we would consider him for palliative care and hospice referral at this point because of the significant disease progression and unlikely to benefit from additional systemic chemotherapy with his condition." ?--worse today, now on BiPAP, increased work of breathing, tachycardic, prognosis guarded. ? ?Multifocal pneumonia ?--now doubted, will complete abx   ? ?Adenocarcinoma of right lung, stage 2  (H[facility]) ?--oncology added steroids, concerned for ADR from [facility].  ? ?Pancytopenia ([place]) ?--secondary to chemotherapy ?--WBC recovered; Hgb stable s/p PRBC; Plts stable ? ?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[place]) ?--appears stable; continue bronchodilators ? ? ? ? ?  ? ?Subjective:  ?--RT charted Pt declined BiPAP, no distress, but anxious. ?--discussed w/ Dr. Erin Fulling -- more distress, started on BiPAP, morphine PRN added ?--pt had rough night, minimal sleep; SOB, struggling ?--wife at bedside ? ?Physical Exam: ?Vitals:  ? 2021/08/29 0750 29-Aug-2021 0800 2021/08/29 0809 08-29-21 0849  ?BP:  (!) 167/88    ?Pulse:  (!) 129 (!) 130   ?Resp:  (!) 35 (!) 32   ?Temp:    97.8 ?F (36.6 ?C)  ?TempSrc:    Oral  ?SpO2: 92% (!) 89% 97%   ?Weight:      ?Height:      ?UOP 2400 ?CT outpt 1700 ?Last BM 4/26 ?HR 120-130s ?RR 20-30s ?HFNC ?Physical Exam ?Vitals and nursing note reviewed.  ?Constitutional:   ?   General: He is in acute distress.  ?   Appearance: He is ill-appearing and toxic-appearing.  ?Cardiovascular:  ?   Rate and Rhythm: Regular rhythm. Tachycardia present.  ?   Heart sounds: No murmur heard. ?   Comments: Telemetry SR ?Pulmonary:  ?   Effort: Respiratory distress present.  ?   Breath sounds: Rhonchi present. No wheezing.  ?   Comments: Marked increased respiratory effort; currently on BiPAP ?Abdominal:  ?   Palpations: Abdomen is soft.  ?Musculoskeletal:  ?   Right lower leg: No edema.  ?   Left lower leg: No edema.  ?Neurological:  ?   Mental Status: He is alert.  ?Psychiatric:     ?   Mood and Affect: Mood normal.     ?   Behavior: Behavior normal.  ? ? ?Data  Reviewed: ? ?There are no new results to review at this time. ? ?Family Communication: wife at bedside ? ?Disposition: ?Status is: Inpatient ?Remains inpatient appropriate because: tachycardia, respiratory failure, high oxygen requirement ? Planned Discharge Destination:  TBD ? ? ? ?Time spent: 30 minutes ? ?Author: ?Murray Hodgkins, MD ?09/11/21 9:02 AM ? ?For on call review [email or web address].  ?

## 2021-08-23 NOTE — Assessment & Plan Note (Signed)
--  thought secondary to [facility]. Treating with high dose steroids ?

## 2021-08-23 NOTE — Death Summary Note (Signed)
? ?DEATH SUMMARY  ? ?Patient Details  ?Name: Richard Li ?MRN: [id number] ?DOB: 22-Mar-1961 ?[id number], Einar [place]he[place], DO ?Admission/Discharge Information  ? ?Admit Date:  04-Sep-2021  ?Date of Death: Date of Death: Sep 09, 2021  ?Time of Death: Time of Death: 07-22-23  ?Length of Stay: 5  ? ?Principle Cause of death: Adenocarcinoma of right lung, stage 2 ([place]) ? ?Hospital Diagnoses: ?Principal Problem: ?  Acute respiratory failure with hypoxia ([place]) ?Active Problems: ?  Multifocal pneumonia ?  Adenocarcinoma of right lung, stage 2 ([place]) ?  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[place]) ?  Pancytopenia ([place]) ?  Hyperlipidemia ?  Essential hypertension ?  Iron deficiency anemia ?  Acute metabolic encephalopathy ?  Drug-induced pneumonitis ?  Malignant pleural effusion ? ? ?Hospital Course: ?61 year old man PMH stage IV lung cancer with metastatic disease to the right kidney, currently undergoing chemotherapy, recurrent malignant pleural effusion, presented with shortness of breath.  Admitted for acute hypoxic respiratory failure secondary to presumed pneumonia.  Treated with antibiotics without significant improvement raising concern for other causes including effusion-mediated, immune-mediated pneumonitis and lymphangitic spread.  Seen by pulmonology, PleurX catheter placed 4/28, became tachycardic afterwards and transferred to stepdown. Seen by oncology, concern for immune mediated pneumonitis, therefore Keytruda stopped and started on prednisone.  Respiratory distress marked, progressed to BiPAP, agitation worsened requiring Precedex infusion.  DNR status confirmed.  Despite maximal medical management patient failed to improve.  In further discussion with the family, patient was made comfort care and subsequently died 09-11-2022. ? ?* Acute respiratory failure with hypoxia ([place]) secondary to lung cancer, drug-induced pneumonitis and lymphangitic spread of cancer, possible pneumonia ?--initially thought  secondary to pneumonia, complicated by metastatic lung adenocarcinoma/COPD with chronic bronchitis and left pleural effusion; but given failure to improve, suspect Keytruda immunotherapy-mediated pneumonitis, ddx includes disease progression with lymphangitic tumor spread. ?--s/p Pleurx catheter 4/28, started on high dose prednisone  ?--however condition worsened requiring transfer to ICU and BiPAP with continue worsening of condition. ? ?Multifocal pneumonia ?--treated with antibiotics ? ?Adenocarcinoma of right lung, stage 2 ([place]) ?--seen by Dr. Julien Nordmann ? ?Drug-induced pneumonitis ?--thought secondary to [facility]. Treated with high dose steroids ? ?Pancytopenia ([place]) ?--secondary to chemotherapy ?--WBC recovered; Hgb stable s/p PRBC; Plts stable ? ?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[place]) ? ? ?  ? ? ?Procedures:  ?PleurX catheter placement ? ?Consultations:  ?Pulmonology ?Oncology ? ?The results of significant diagnostics from this hospitalization (including imaging, microbiology, ancillary and laboratory) are listed below for reference.  ? ?Significant Diagnostic Studies: ?DG Chest 1 View ? ?Result Date: 08/10/2021 ?CLINICAL DATA:  S/p thoracentesis. EXAM: PORTABLE CHEST 1 VIEW COMPARISON:  Chest XR and IR ultrasound, earlier same day. CT chest, 08/10/2021. FINDINGS: Support lines: RIGHT chest port catheter tip at the distal SVC overlying pacer leads. Similar findings of chronic-appearing volume loss, pleuroparenchymal thickening involving the RIGHT upper chest, with rightward deviation of the mediastinal structures. Linear, coarse interstitial thickening involving the RIGHT lower lobe. Improved dilation of the LEFT lung with trace residual pleural effusion. No pneumothorax. No interval osseous abnormality. IMPRESSION: 1. Trace residual LEFT pleural effusion.  No pneumothorax. 2. Chronic changes RIGHT upper chest, with worsening of interstitial thickening within the RIGHT lower  lobe. Findings suspicious for superimposed infection. Electronically Signed   By: Michaelle Birks M.D.   On: 08/10/2021 16:40  ? ?DG Chest 2 View ? ?Result Date: 08/10/2021 ?CLINICAL DATA:  Shortness of breath EXAM: CHEST - 2 VIEW COMPARISON:  07/30/2021 FINDINGS: Right-sided  chest port remains in place. Volume loss with extensive pleuroparenchymal scarring at the right lung apex, similar in appearance to the previous study. Chronic blunting of the right costophrenic angle, likely a component of scarring. Increased interstitial markings within the right lower lobe. Persistent small left pleural effusion with likely loculated component. Heart size within normal limits. No pneumothorax. IMPRESSION: 1. Persistent small left pleural effusion with likely loculated component. 2. Increased interstitial markings within the right lower lobe, may represent developing infiltrate. Electronically Signed   By: Davina Poke D.O.   On: 08/10/2021 10:40  ? ?DG Chest 2 View ? ?Result Date: 07/30/2021 ?CLINICAL DATA:  Left pleural effusion.  Shortness of breath. EXAM: CHEST - 2 VIEW COMPARISON:  07/15/2021 FINDINGS: Volume loss right hemithorax is stable with extensive pleuroparenchymal scarring in the right apex. Small left pleural effusion is similar to prior, likely with loculated component. The cardiopericardial silhouette is within normal limits for size. Bones are diffusely demineralized. IMPRESSION: Similar appearance of small left pleural effusion, likely with loculated component. Electronically Signed   By: Misty Stanley M.D.   On: 07/30/2021 11:39  ? ?CT Angio Chest PE W and/or Wo Contrast ? ?Result Date: 08/01/2021 ?CLINICAL DATA:  Progressive shortness of breath. Hypoxia. Tachypnea. Personal history of lung cancer and COPD. Nonspecific abdominal pain. EXAM: CT ANGIOGRAPHY CHEST CT ABDOMEN AND PELVIS WITH CONTRAST TECHNIQUE: Multidetector CT imaging of the chest was performed using the standard protocol during bolus  administration of intravenous contrast. Multiplanar CT image reconstructions and MIPs were obtained to evaluate the vascular anatomy. Multidetector CT imaging of the abdomen and pelvis was performed using the standard protocol during bolus administration of intravenous contrast. RADIATION DOSE REDUCTION: This exam was performed according to the departmental dose-optimization program which includes automated exposure control, adjustment of the mA and/or kV according to patient size and/or use of iterative reconstruction technique. CONTRAST:  128mL OMNIPAQUE IOHEXOL 350 MG/ML SOLN COMPARISON:  CT a chest 08/10/2021. CT chest abdomen and pelvis 06/21/2021. FINDINGS: CTA CHEST FINDINGS Cardiovascular: Heart size is normal. Right IJ Port-A-Cath again noted. Minimal pericardial fluid is present. Atherosclerotic calcifications are again seen at the aortic arch. Great vessel origins are within normal limits. No aneurysm or stenosis is present. Pulmonary artery opacification is excellent. No focal filling defects are present to suggest pulmonary embolus. Mediastinum/Nodes: No significant mediastinal, hilar, or axillary adenopathy is present. Lungs/Pleura: Post treatment changes in the right upper lobe again noted with prominent air bronchograms. A large left pleural effusion has decreased slightly. Fluid and pleural thickening noted along the major fissures. Progressive diffuse airspace disease is present in the lower lobes bilaterally and lingula. Minimal dependent atelectasis is present. Musculoskeletal: A T12 compression fracture is stable. No new fractures are present. No chest wall lesions are present. Ribs are within normal limits. Review of the MIP images confirms the above findings. CT ABDOMEN and PELVIS FINDINGS Hepatobiliary: No focal liver abnormality is seen. No gallstones, gallbladder wall thickening, or biliary dilatation. Pancreas: Unremarkable. No pancreatic ductal dilatation or surrounding inflammatory  changes. Spleen: Normal in size without focal abnormality. Adrenals/Urinary Tract: Adrenal glands are normal bilaterally. Kidneys are unremarkable. Ureters are within normal limits. Mild bladder wall thickening is simila

## 2021-08-23 DEATH — deceased

## 2021-08-26 ENCOUNTER — Ambulatory Visit: Payer: BC Managed Care – PPO | Admitting: Physician Assistant

## 2021-08-26 ENCOUNTER — Other Ambulatory Visit: Payer: BC Managed Care – PPO

## 2021-08-26 ENCOUNTER — Ambulatory Visit: Payer: BC Managed Care – PPO

## 2021-09-16 ENCOUNTER — Ambulatory Visit: Payer: BC Managed Care – PPO | Admitting: Internal Medicine

## 2021-09-16 ENCOUNTER — Other Ambulatory Visit: Payer: BC Managed Care – PPO

## 2021-09-16 ENCOUNTER — Ambulatory Visit: Payer: BC Managed Care – PPO

## 2021-09-25 LAB — ACID FAST CULTURE WITH REFLEXED SENSITIVITIES (MYCOBACTERIA): Acid Fast Culture: NEGATIVE

## 2021-10-07 ENCOUNTER — Other Ambulatory Visit: Payer: BC Managed Care – PPO

## 2021-10-07 ENCOUNTER — Ambulatory Visit: Payer: BC Managed Care – PPO | Admitting: Physician Assistant

## 2021-10-07 ENCOUNTER — Ambulatory Visit: Payer: BC Managed Care – PPO

## 2021-10-28 ENCOUNTER — Ambulatory Visit: Payer: BC Managed Care – PPO

## 2021-10-28 ENCOUNTER — Ambulatory Visit: Payer: BC Managed Care – PPO | Admitting: Internal Medicine

## 2021-10-28 ENCOUNTER — Other Ambulatory Visit: Payer: BC Managed Care – PPO

## 2021-12-17 ENCOUNTER — Ambulatory Visit: Payer: BC Managed Care – PPO | Admitting: Family Medicine
